# Patient Record
Sex: Male | Born: 1935
Health system: Southern US, Community
[De-identification: ages and names within clinical notes are randomized; demographics above are authoritative.]

## PROBLEM LIST (undated history)

## (undated) DIAGNOSIS — R0602 Shortness of breath: Secondary | ICD-10-CM

## (undated) DIAGNOSIS — I2510 Atherosclerotic heart disease of native coronary artery without angina pectoris: Secondary | ICD-10-CM

## (undated) DIAGNOSIS — I499 Cardiac arrhythmia, unspecified: Secondary | ICD-10-CM

## (undated) DIAGNOSIS — N189 Chronic kidney disease, unspecified: Secondary | ICD-10-CM

## (undated) DIAGNOSIS — K635 Polyp of colon: Secondary | ICD-10-CM

## (undated) DIAGNOSIS — K5790 Diverticulosis of intestine, part unspecified, without perforation or abscess without bleeding: Secondary | ICD-10-CM

## (undated) DIAGNOSIS — I714 Abdominal aortic aneurysm, without rupture, unspecified: Secondary | ICD-10-CM

## (undated) DIAGNOSIS — I739 Peripheral vascular disease, unspecified: Secondary | ICD-10-CM

## (undated) DIAGNOSIS — I509 Heart failure, unspecified: Secondary | ICD-10-CM

## (undated) DIAGNOSIS — Z953 Presence of xenogenic heart valve: Secondary | ICD-10-CM

## (undated) DIAGNOSIS — Z87440 Personal history of urinary (tract) infections: Secondary | ICD-10-CM

## (undated) DIAGNOSIS — E785 Hyperlipidemia, unspecified: Secondary | ICD-10-CM

## (undated) DIAGNOSIS — I219 Acute myocardial infarction, unspecified: Secondary | ICD-10-CM

## (undated) DIAGNOSIS — Z978 Presence of other specified devices: Secondary | ICD-10-CM

## (undated) DIAGNOSIS — K648 Other hemorrhoids: Secondary | ICD-10-CM

## (undated) DIAGNOSIS — K469 Unspecified abdominal hernia without obstruction or gangrene: Secondary | ICD-10-CM

## (undated) DIAGNOSIS — I4891 Unspecified atrial fibrillation: Secondary | ICD-10-CM

## (undated) DIAGNOSIS — K219 Gastro-esophageal reflux disease without esophagitis: Secondary | ICD-10-CM

## (undated) DIAGNOSIS — I10 Essential (primary) hypertension: Secondary | ICD-10-CM

## (undated) DIAGNOSIS — A389 Scarlet fever, uncomplicated: Secondary | ICD-10-CM

## (undated) HISTORY — PX: HERNIA REPAIR: SHX51

## (undated) HISTORY — PX: CARDIAC VALVE SURGERY: SHX40

## (undated) HISTORY — DX: Diverticulosis of intestine, part unspecified, without perforation or abscess without bleeding: K57.90

## (undated) HISTORY — DX: Abdominal aortic aneurysm, without rupture: I71.4

## (undated) HISTORY — PX: CORONARY ARTERY BYPASS GRAFT: SHX141

## (undated) HISTORY — DX: Atherosclerotic heart disease of native coronary artery without angina pectoris: I25.10

## (undated) HISTORY — DX: Abdominal aortic aneurysm, without rupture, unspecified: I71.40

## (undated) HISTORY — PX: APPENDECTOMY: SHX54

## (undated) HISTORY — DX: Other hemorrhoids: K64.8

## (undated) HISTORY — DX: Personal history of urinary (tract) infections: Z87.440

## (undated) HISTORY — DX: Unspecified atrial fibrillation: I48.91

## (undated) HISTORY — DX: Peripheral vascular disease, unspecified: I73.9

## (undated) HISTORY — PX: TONSILLECTOMY: SUR1361

## (undated) HISTORY — PX: EYE SURGERY: SHX253

## (undated) HISTORY — DX: Hyperlipidemia, unspecified: E78.5

## (undated) HISTORY — DX: Gastro-esophageal reflux disease without esophagitis: K21.9

## (undated) HISTORY — DX: Essential (primary) hypertension: I10

## (undated) HISTORY — DX: Polyp of colon: K63.5

## (undated) HISTORY — DX: Heart failure, unspecified: I50.9

## (undated) HISTORY — PX: ANGIOPLASTY: SHX39

---

## 2001-11-29 ENCOUNTER — Ambulatory Visit (HOSPITAL_COMMUNITY): Admission: RE | Admit: 2001-11-29 | Discharge: 2001-11-29 | Payer: Self-pay | Admitting: Cardiology

## 2001-11-29 ENCOUNTER — Encounter: Payer: Self-pay | Admitting: Cardiology

## 2003-01-10 ENCOUNTER — Encounter: Payer: Self-pay | Admitting: *Deleted

## 2003-01-10 ENCOUNTER — Ambulatory Visit (HOSPITAL_COMMUNITY): Admission: RE | Admit: 2003-01-10 | Discharge: 2003-01-10 | Payer: Self-pay | Admitting: *Deleted

## 2004-11-24 ENCOUNTER — Ambulatory Visit (HOSPITAL_COMMUNITY): Admission: RE | Admit: 2004-11-24 | Discharge: 2004-11-24 | Payer: Self-pay | Admitting: Ophthalmology

## 2004-11-24 ENCOUNTER — Encounter: Payer: Self-pay | Admitting: Ophthalmology

## 2005-04-21 ENCOUNTER — Ambulatory Visit: Payer: Self-pay | Admitting: *Deleted

## 2005-08-19 ENCOUNTER — Ambulatory Visit: Payer: Self-pay

## 2006-10-01 ENCOUNTER — Ambulatory Visit: Payer: Self-pay | Admitting: Cardiology

## 2006-10-04 ENCOUNTER — Ambulatory Visit: Payer: Self-pay | Admitting: Cardiology

## 2006-10-05 ENCOUNTER — Inpatient Hospital Stay (HOSPITAL_BASED_OUTPATIENT_CLINIC_OR_DEPARTMENT_OTHER): Admission: RE | Admit: 2006-10-05 | Discharge: 2006-10-05 | Payer: Self-pay | Admitting: Cardiovascular Disease

## 2006-10-05 ENCOUNTER — Ambulatory Visit: Payer: Self-pay | Admitting: Cardiovascular Disease

## 2006-10-11 ENCOUNTER — Ambulatory Visit: Payer: Self-pay

## 2006-10-11 ENCOUNTER — Ambulatory Visit: Payer: Self-pay | Admitting: *Deleted

## 2006-10-11 ENCOUNTER — Encounter: Payer: Self-pay | Admitting: Cardiology

## 2006-10-11 LAB — CONVERTED CEMR LAB
BUN: 13 mg/dL (ref 6–23)
CO2: 28 meq/L (ref 19–32)
Calcium: 9.1 mg/dL (ref 8.4–10.5)
GFR calc non Af Amer: 63 mL/min
Glomerular Filtration Rate, Af Am: 77 mL/min/{1.73_m2}
Potassium: 4 meq/L (ref 3.5–5.1)

## 2006-10-14 ENCOUNTER — Ambulatory Visit: Admission: RE | Admit: 2006-10-14 | Discharge: 2006-10-14 | Payer: Self-pay | Admitting: *Deleted

## 2006-10-18 ENCOUNTER — Ambulatory Visit: Payer: Self-pay | Admitting: *Deleted

## 2006-10-19 ENCOUNTER — Ambulatory Visit: Payer: Self-pay | Admitting: *Deleted

## 2006-10-26 ENCOUNTER — Ambulatory Visit: Payer: Self-pay | Admitting: Internal Medicine

## 2006-10-26 ENCOUNTER — Ambulatory Visit (HOSPITAL_COMMUNITY): Admission: RE | Admit: 2006-10-26 | Discharge: 2006-10-26 | Payer: Self-pay | Admitting: *Deleted

## 2006-11-16 ENCOUNTER — Encounter (INDEPENDENT_AMBULATORY_CARE_PROVIDER_SITE_OTHER): Payer: Self-pay | Admitting: Specialist

## 2006-11-16 ENCOUNTER — Inpatient Hospital Stay (HOSPITAL_COMMUNITY): Admission: RE | Admit: 2006-11-16 | Discharge: 2006-11-22 | Payer: Self-pay | Admitting: Cardiothoracic Surgery

## 2006-11-16 ENCOUNTER — Ambulatory Visit: Payer: Self-pay | Admitting: Cardiology

## 2006-11-24 ENCOUNTER — Ambulatory Visit: Payer: Self-pay | Admitting: *Deleted

## 2006-11-24 ENCOUNTER — Inpatient Hospital Stay (HOSPITAL_COMMUNITY): Admission: AD | Admit: 2006-11-24 | Discharge: 2006-11-26 | Payer: Self-pay | Admitting: *Deleted

## 2006-11-25 ENCOUNTER — Encounter: Payer: Self-pay | Admitting: Cardiology

## 2006-11-29 ENCOUNTER — Ambulatory Visit: Payer: Self-pay | Admitting: Cardiology

## 2006-12-01 ENCOUNTER — Ambulatory Visit: Payer: Self-pay | Admitting: *Deleted

## 2006-12-03 ENCOUNTER — Ambulatory Visit: Payer: Self-pay | Admitting: Cardiovascular Disease

## 2006-12-10 ENCOUNTER — Ambulatory Visit: Payer: Self-pay | Admitting: Cardiology

## 2006-12-15 ENCOUNTER — Ambulatory Visit: Payer: Self-pay | Admitting: *Deleted

## 2006-12-22 ENCOUNTER — Ambulatory Visit: Payer: Self-pay | Admitting: Internal Medicine

## 2007-01-04 ENCOUNTER — Ambulatory Visit: Payer: Self-pay | Admitting: *Deleted

## 2007-01-12 ENCOUNTER — Ambulatory Visit: Payer: Self-pay | Admitting: Cardiovascular Disease

## 2007-02-09 ENCOUNTER — Ambulatory Visit: Payer: Self-pay | Admitting: Cardiology

## 2007-02-10 ENCOUNTER — Ambulatory Visit: Payer: Self-pay | Admitting: *Deleted

## 2007-03-09 ENCOUNTER — Ambulatory Visit: Payer: Self-pay | Admitting: *Deleted

## 2007-03-09 ENCOUNTER — Ambulatory Visit: Payer: Self-pay | Admitting: Cardiology

## 2007-03-09 LAB — CONVERTED CEMR LAB
BUN: 13 mg/dL (ref 6–23)
Calcium: 9.2 mg/dL (ref 8.4–10.5)
Chloride: 103 meq/L (ref 96–112)
GFR calc non Af Amer: 70 mL/min

## 2007-04-01 ENCOUNTER — Ambulatory Visit: Payer: Self-pay | Admitting: Cardiology

## 2007-04-20 ENCOUNTER — Ambulatory Visit: Payer: Self-pay | Admitting: *Deleted

## 2007-05-04 ENCOUNTER — Ambulatory Visit: Payer: Self-pay | Admitting: Cardiology

## 2007-06-01 ENCOUNTER — Ambulatory Visit: Payer: Self-pay | Admitting: Cardiology

## 2007-06-15 ENCOUNTER — Ambulatory Visit: Payer: Self-pay | Admitting: Cardiology

## 2007-06-30 ENCOUNTER — Ambulatory Visit: Payer: Self-pay | Admitting: Cardiology

## 2007-07-06 ENCOUNTER — Ambulatory Visit: Payer: Self-pay | Admitting: Cardiology

## 2007-07-27 ENCOUNTER — Ambulatory Visit: Payer: Self-pay | Admitting: Cardiology

## 2007-08-09 ENCOUNTER — Ambulatory Visit: Payer: Self-pay | Admitting: Cardiology

## 2007-08-24 ENCOUNTER — Ambulatory Visit: Payer: Self-pay | Admitting: Cardiology

## 2007-08-30 ENCOUNTER — Ambulatory Visit: Payer: Self-pay

## 2007-09-14 ENCOUNTER — Ambulatory Visit: Payer: Self-pay | Admitting: Cardiology

## 2007-09-29 ENCOUNTER — Ambulatory Visit: Payer: Self-pay | Admitting: Cardiology

## 2007-10-13 ENCOUNTER — Ambulatory Visit: Payer: Self-pay

## 2007-10-27 ENCOUNTER — Ambulatory Visit: Payer: Self-pay | Admitting: Cardiovascular Disease

## 2007-11-04 ENCOUNTER — Ambulatory Visit: Payer: Self-pay | Admitting: Internal Medicine

## 2007-11-04 LAB — CONVERTED CEMR LAB
Basophils Relative: 0.2 % (ref 0.0–1.0)
Eosinophils Relative: 0.9 % (ref 0.0–5.0)
Hemoglobin: 13.9 g/dL (ref 13.0–17.0)
Lymphocytes Relative: 16.7 % (ref 12.0–46.0)
Monocytes Absolute: 0.5 10*3/uL (ref 0.2–0.7)
Monocytes Relative: 6.8 % (ref 3.0–11.0)
Neutro Abs: 5 10*3/uL (ref 1.4–7.7)
RDW: 13.8 % (ref 11.5–14.6)
WBC: 6.7 10*3/uL (ref 4.5–10.5)

## 2007-11-11 ENCOUNTER — Inpatient Hospital Stay (HOSPITAL_COMMUNITY): Admission: EM | Admit: 2007-11-11 | Discharge: 2007-11-14 | Payer: Self-pay | Admitting: Emergency Medicine

## 2007-11-11 ENCOUNTER — Ambulatory Visit: Payer: Self-pay | Admitting: Cardiovascular Disease

## 2007-11-23 ENCOUNTER — Ambulatory Visit: Payer: Self-pay | Admitting: Cardiology

## 2007-11-29 ENCOUNTER — Encounter: Admission: RE | Admit: 2007-11-29 | Discharge: 2007-11-29 | Payer: Self-pay | Admitting: Neurosurgery

## 2007-12-30 ENCOUNTER — Encounter: Admission: RE | Admit: 2007-12-30 | Discharge: 2007-12-30 | Payer: Self-pay | Admitting: Neurosurgery

## 2008-02-03 ENCOUNTER — Encounter: Admission: RE | Admit: 2008-02-03 | Discharge: 2008-02-03 | Payer: Self-pay | Admitting: Neurosurgery

## 2008-02-21 ENCOUNTER — Ambulatory Visit: Payer: Self-pay | Admitting: Cardiology

## 2008-02-28 ENCOUNTER — Ambulatory Visit: Payer: Self-pay

## 2008-11-01 ENCOUNTER — Ambulatory Visit: Payer: Self-pay

## 2008-11-01 ENCOUNTER — Ambulatory Visit: Payer: Self-pay | Admitting: Cardiology

## 2008-11-20 ENCOUNTER — Ambulatory Visit: Payer: Self-pay

## 2008-11-20 ENCOUNTER — Encounter: Payer: Self-pay | Admitting: Cardiology

## 2009-05-07 ENCOUNTER — Encounter: Payer: Self-pay | Admitting: Cardiology

## 2009-05-08 ENCOUNTER — Encounter: Payer: Self-pay | Admitting: Cardiology

## 2009-05-08 LAB — CONVERTED CEMR LAB
ALT: 13 units/L
AST: 16 units/L
CO2: 26 meq/L
Calcium: 9.1 mg/dL
Chloride: 101 meq/L
Cholesterol: 148 mg/dL
HDL: 46 mg/dL
Potassium: 4 meq/L
Triglyceride fasting, serum: 113 mg/dL

## 2009-06-03 ENCOUNTER — Encounter: Payer: Self-pay | Admitting: Cardiology

## 2009-07-06 DIAGNOSIS — I714 Abdominal aortic aneurysm, without rupture, unspecified: Secondary | ICD-10-CM | POA: Insufficient documentation

## 2009-07-06 DIAGNOSIS — Z8679 Personal history of other diseases of the circulatory system: Secondary | ICD-10-CM | POA: Insufficient documentation

## 2009-07-16 ENCOUNTER — Ambulatory Visit: Payer: Self-pay | Admitting: Cardiology

## 2009-08-06 ENCOUNTER — Encounter: Payer: Self-pay | Admitting: Cardiology

## 2009-10-23 ENCOUNTER — Encounter: Payer: Self-pay | Admitting: Cardiology

## 2009-10-23 ENCOUNTER — Ambulatory Visit (HOSPITAL_COMMUNITY): Admission: RE | Admit: 2009-10-23 | Discharge: 2009-10-23 | Payer: Self-pay | Admitting: Cardiology

## 2009-10-23 ENCOUNTER — Ambulatory Visit: Payer: Self-pay

## 2009-10-23 ENCOUNTER — Ambulatory Visit: Payer: Self-pay | Admitting: Internal Medicine

## 2009-11-15 ENCOUNTER — Encounter (INDEPENDENT_AMBULATORY_CARE_PROVIDER_SITE_OTHER): Payer: Self-pay | Admitting: *Deleted

## 2010-02-11 ENCOUNTER — Encounter: Payer: Self-pay | Admitting: Cardiology

## 2010-06-03 ENCOUNTER — Encounter: Payer: Self-pay | Admitting: Cardiology

## 2010-12-02 ENCOUNTER — Encounter: Payer: Self-pay | Admitting: Cardiology

## 2011-01-01 ENCOUNTER — Telehealth: Payer: Self-pay | Admitting: Cardiology

## 2011-01-06 ENCOUNTER — Ambulatory Visit
Admission: RE | Admit: 2011-01-06 | Discharge: 2011-01-06 | Payer: Self-pay | Source: Home / Self Care | Attending: Cardiology | Admitting: Cardiology

## 2011-01-06 ENCOUNTER — Encounter: Payer: Self-pay | Admitting: Cardiology

## 2011-01-13 NOTE — Letter (Signed)
Summary: [facility] Note  [facility] Note   Imported By: Roderic Ovens 02/28/2010 15:01:09  _____________________________________________________________________  External Attachment:    Type:   Image     Comment:   External Document

## 2011-01-13 NOTE — Letter (Signed)
Summary: [facility] Progress Note   [facility] Progress Note   Imported By: Roderic Ovens 06/30/2010 14:25:28  _____________________________________________________________________  External Attachment:    Type:   Image     Comment:   External Document

## 2011-01-15 ENCOUNTER — Encounter (INDEPENDENT_AMBULATORY_CARE_PROVIDER_SITE_OTHER): Payer: Medicare Other

## 2011-01-15 ENCOUNTER — Encounter: Payer: Self-pay | Admitting: Cardiology

## 2011-01-15 DIAGNOSIS — I714 Abdominal aortic aneurysm, without rupture: Secondary | ICD-10-CM

## 2011-01-15 DIAGNOSIS — I6529 Occlusion and stenosis of unspecified carotid artery: Secondary | ICD-10-CM

## 2011-01-15 NOTE — Progress Notes (Addendum)
Summary: [facility]  Medications Added LOVAZA 1 GM CAPS (OMEGA-3-ACID ETHYL ESTERS) 2caps qam..1 cap at bedtime NIASPAN 500 MG CR-TABS (NIACIN (ANTIHYPERLIPIDEMIC)) Take 1 tablet by mouth once a day      Allergies Added: NKDA  Visit Type:  W1X Primary Provider:  Casimiro Needle Altheimer  CC:  twinges LUQ.. no other complaints today.  History of Present Illness: Nathaniel [name] returns today for followup of his complex cardiac and vascular history.  He's been doing remarkably well playing golf and dating an old girlfriend. He occasionally twinge of discomfort that lasts seconds along the left sternal border. He thinks this is related to landing on his left shoulder when he reads in bed. He has had no angina.  Denies symptoms of TIAs or mini strokes. He has had no palpitations presyncope or syncope. He's had no lower extremity edema.  I reviewed his records from Dr.Altheimers office. His blood sugar has recent been less well controlled with hemoglobin A1c of 7.3%. He's had difficulty tolerating some of the anti-hyperglycemics.  His lipids have been good with a good HDL.  His last abdominal and carotid ultrasound were in November of 2010. Last echocardiogram was at that time as well and was stable.  Current Medications (verified): 1)  Allopurinol 300 Mg Tabs (Allopurinol) .Marland Kitchen.. 1 Tab Once Daily 2)  Lipitor 20 Mg Tabs (Atorvastatin Calcium) .Marland Kitchen.. 1 Tab Once Daily 3)  Lanoxin 0.25 Mg Tabs (Digoxin) .Marland Kitchen.. 1 Tab Once Daily 4)  Icaps  Caps (Multiple Vitamins-Minerals) .Marland Kitchen.. 1 Tab Once Daily 5)  Altace 5 Mg Caps (Ramipril) .Marland Kitchen.. 1 Cap Once Daily 6)  Aspirin 81 Mg Tbec (Aspirin) .... Take One Tablet By Mouth Daily 7)  Doxazosin Mesylate 2 Mg Tabs (Doxazosin Mesylate) .... 2 Tabs Qam..1 Tab At Bedtime 8)  Actos 30 Mg Tabs (Pioglitazone Hcl) .Marland Kitchen.. 1 Tab Once Daily 9)  Lovaza 1 Gm Caps (Omega-3-Acid Ethyl Esters) .... 2caps Qam..1 Cap At Bedtime 10)  Claritin Reditabs 10 Mg Tbdp (Loratadine) .... As  Needed 11)  Niaspan 500 Mg Cr-Tabs (Niacin (Antihyperlipidemic)) .... Take 1 Tablet By Mouth Once A Day  Allergies (verified): No Known Drug Allergies  Past History:  Past Medical History: Last updated: Jul 12, 2009 CAD, ARTERY BYPASS GRAFT/1988.Marland KitchenREDO 11/2006 (ICD-414.04) ATRIAL FIBRILLATION/POSTOPERATIVE (ICD-427.31) AORTIC STENOSIS, SEVERE/HX OF (ICD-424.1) ABDOMINAL AORTIC ANEURYSM (ICD-441.4) CEREBROVASCULAR DISEASE, HX OF (ICD-V12.50)    Past Surgical History: Last updated: 07-12-2009 CABG 1988..redo 11/2006 Posterior vitrectomy with membrane peel..left eye... SURGEON:  Alford Highland. Rankin, M.D...11/24/2004  Family History: Last updated: 07/12/09 Father died of aortic aneurysm at age 33. Mother died of angina and congestive heart failure at age 67. He has no brothers.  One sister status post coronary angioplasty. He has 2 children, both of whom are adopted.   Social History: Last updated: July 12, 2009 Widowed 1986 He has been helping raise 3 granddaughters since 1994 with a long history of multiple situational family stresses.  He is self-employed as an Engineer, civil (consulting).  He quit smoking in 1992.  He drinks 2 glasses of wine each evening.       Review of Systems       negative other than history of present illness  Vital Signs:  Patient profile:   77 year old male Height:      71.5 inches Weight:      197.75 pounds BMI:     27.29 Pulse rate:   64 / minute Pulse rhythm:   irregular BP sitting:   106 / 62  (left arm) Cuff size:  large  Vitals Entered By: Danielle Rankin, CMA (January 06, 2011 3:57 PM)  Physical Exam  General:  Well developed, well nourished, in no acute distress. Head:  normocephalic and atraumatic Eyes:  PERRLA/EOM intact; conjunctiva and lids normal. Neck:  Neck supple, no JVD. No masses, thyromegaly or abnormal cervical nodes. Chest Dimitri Dsouza:  no tenderness and no instability Lungs:  Clear bilaterally to auscultation and percussion. Heart:  PMI  nondisplaced regular rate and rhythm normal S1-S2, no aortic insufficiency soft systolic murmur bilateral carotid bruits Abdomen:  nontender good bowel sounds no bruit Msk:  Back normal, normal gait. Muscle strength and tone normal. Pulses:  pulses normal in all 4 extremities Extremities:  No clubbing or cyanosis. Neurologic:  Alert and oriented x 3. Skin:  Intact without lesions or rashes. Psych:  Normal affect.   Impression & Recommendations:  Problem # 1:  CAD, ARTERY BYPASS GRAFT/1988.Marland KitchenREDO 11/2006 (ICD-414.04) Assessment Unchanged  His updated medication list for this problem includes:    Altace 5 Mg Caps (Ramipril) .Marland Kitchen... 1 cap once daily    Aspirin 81 Mg Tbec (Aspirin) .Marland Kitchen... Take one tablet by mouth daily  Orders: Carotid Duplex (Carotid Duplex) EKG w/ Interpretation (93000)  Problem # 2:  ATRIAL FIBRILLATION/POSTOPERATIVE (ICD-427.31) Assessment: Improved  His updated medication list for this problem includes:    Lanoxin 0.25 Mg Tabs (Digoxin) .Marland Kitchen... 1 tab once daily    Aspirin 81 Mg Tbec (Aspirin) .Marland Kitchen... Take one tablet by mouth daily  Orders: Carotid Duplex (Carotid Duplex) EKG w/ Interpretation (93000)  Problem # 3:  AORTIC STENOSIS, SEVERE/HX OF (ICD-424.1) Assessment: Improved status post aortic valve replacement His updated medication list for this problem includes:    Lanoxin 0.25 Mg Tabs (Digoxin) .Marland Kitchen... 1 tab once daily    Altace 5 Mg Caps (Ramipril) .Marland Kitchen... 1 cap once daily  Problem # 4:  ABDOMINAL AORTIC ANEURYSM (ICD-441.4) needs repeat ultrasound  Problem # 5:  CEREBROVASCULAR DISEASE, HX OF (ICD-V12.50) needs repeat Dopplers Orders: Carotid Duplex (Carotid Duplex)  Other Orders: Abdominal Aorta Duplex (Abd Aorta Duplex)  Patient Instructions: 1)  Your physician recommends that you schedule a follow-up appointment in:  2)  Your physician has requested that you have a carotid duplex. This test is an ultrasound of the carotid arteries in your neck.  It looks at blood flow through these arteries that supply the brain with blood. Allow one hour for this exam. There are no restrictions or special instructions. 3)  Your physician recommends that you continue on your current medications as directed. Please refer to the Current Medication list given to you today. 4)  Your physician has requested that you have an abdominal aorta duplex. During this test, an ultrasound is used to evaluate the aorta. Allow 30 minutes for this exam. Do not eat after midnight the day before and avoid carbonated beverages. There are no restrictions or special instructions.

## 2011-01-15 NOTE — Telephone Encounter (Addendum)
   Phone Note Outgoing Call   Call placed by: Lisabeth Devoid RN,  January 01, 2011 2:49 PM Call placed to: Patient Summary of Call: appt scheduled  Follow-up for Phone Call        Pt aware of appt to follow-up with Dr. Daleen Squibb on 01/06/11 at 3:45 per Dr. Leslie Dales. Mylo Red RN Follow-up by: Lisabeth Devoid RN,  January 01, 2011 2:50 PM

## 2011-01-21 NOTE — Progress Notes (Addendum)
Summary: f1y/dfg    Allergies: No Known Drug Allergies

## 2011-01-28 ENCOUNTER — Telehealth: Payer: Self-pay | Admitting: Cardiology

## 2011-04-02 ENCOUNTER — Encounter: Payer: Self-pay | Admitting: Internal Medicine

## 2011-04-02 ENCOUNTER — Ambulatory Visit (INDEPENDENT_AMBULATORY_CARE_PROVIDER_SITE_OTHER): Payer: Medicare Other | Admitting: Internal Medicine

## 2011-04-02 ENCOUNTER — Telehealth: Payer: Self-pay | Admitting: Internal Medicine

## 2011-04-02 DIAGNOSIS — Z1211 Encounter for screening for malignant neoplasm of colon: Secondary | ICD-10-CM

## 2011-04-02 DIAGNOSIS — K625 Hemorrhage of anus and rectum: Secondary | ICD-10-CM

## 2011-04-02 DIAGNOSIS — E119 Type 2 diabetes mellitus without complications: Secondary | ICD-10-CM

## 2011-04-02 MED ORDER — PEG-KCL-NACL-NASULF-NA ASC-C 100 G PO SOLR: 1.0000 | Freq: Once | ORAL | Status: DC

## 2011-04-02 MED ORDER — PEG-KCL-NACL-NASULF-NA ASC-C 100 G PO SOLR: 1.0000 | Freq: Once | ORAL | Status: AC

## 2011-04-02 NOTE — Patient Instructions (Signed)
Colonoscopy [facility] 4th floor 04/07/11 2:30 pm arrive at 1:30  Colonoscopy brochure given for you to read Hold Actos day of procedure.

## 2011-04-02 NOTE — Telephone Encounter (Signed)
Pt was not seen by Dr. Jarold Motto yesterday. Pt had spoken with Dr. Marina Goodell and he agreed to work the pt in for today.

## 2011-04-02 NOTE — Telephone Encounter (Signed)
Pt set up to see Dr. Marina Goodell today at 2pm. Pt aware of appt date and time.

## 2011-04-02 NOTE — Progress Notes (Signed)
HISTORY OF PRESENT ILLNESS:  Nathaniel [name]. is a 76 y.o. male with multiple significant medical problems, though stable, as listed below. He presents today with concerns of altered bowel habits, fecal soilage, and rectal bleeding. His only GI history is a flexible sigmoidoscopy many years ago. Patient tells me that he has had a several month history of problems with increased frequency of bowel movements. Associated with this has been some rectal burning, fecal soilage, and bleeding. He altered his diet to eliminate chocolate and champagne which resulted in improved bowel movements. However, other symptoms persisted. He denies any change in medications. He is on medication for diabetes, though no longer on Coumadin.  REVIEW OF SYSTEMS:  All non-GI ROS negative except for sinus allergy trouble, back pain, shortness of breath, excessive urination.  Past Medical History  Diagnosis Date  . CAD (coronary artery disease)   . Atrial fibrillation   . Diabetes mellitus   . Hyperlipemia   . Hx: UTI (urinary tract infection)   . Gout   . Hypertension     Past Surgical History  Procedure Date  . Appendectomy   . Cardiac valve surgery   . Angioplasty     Unsure if he had stents put in   . Coronary artery bypass graft   . Hernia repair     Social History Nathaniel [name].  reports that he has quit smoking. His smoking use included Cigarettes. He quit after 20 years of use. He has never used smokeless tobacco. He reports that he drinks alcohol. He reports that he does not use illicit drugs.  family history includes Heart disease in his father.  There is no history of Colon cancer.  No Known Allergies     PHYSICAL EXAMINATION: Vital signs: BP 136/68  Pulse 80  Ht 5\' 11"  (1.803 m)  Wt 200 lb (90.719 kg)  BMI 27.89 kg/m2  Constitutional: generally well-appearing, no acute distress Psychiatric: alert and oriented x3, cooperative Eyes: extraocular movements intact, anicteric, conjunctiva  pink Mouth: oral pharynx moist, no lesions Neck: supple no lymphadenopathy Cardiovascular: heart regular rate and rhythm, 3/6 systolic murmur with prominent heart sounds. Midline incision well-healed Lungs: clear to auscultation bilaterally Abdomen: soft, nontender, nondistended, no obvious ascites, no peritoneal signs, normal bowel sounds, no organomegaly. Prior surgical incision well-healed Rectal: Internal hemorrhoids and small posterior fissure. Normal sphincter down. Normal perianal sensation. No internal mass or tenderness. Hemoccult negative stool Extremities: no lower extremity edema bilaterally Skin: no lesions on visible extremities Neuro: No focal deficits. No asterixis. Normal reflexes  ASSESSMENT:  #1. Minor intermittent rectal bleeding likely due to benign anorectal pathology #2. Colon cancer screening. Appropriate candidate without contraindication #3. Fecal soilage with burning. No obvious impairment in tone. #4. Multiple medical problems. Stable. #5. Diabetes mellitus.  PLAN:  #1. Increased daily fiber supplementation #2. Schedule colonoscopy. Patient is higher than baseline risk due to his comorbidities.The nature of the procedure, as well as the risks, benefits, and alternatives were carefully and thoroughly reviewed with the patient. Ample time for discussion and questions allowed. The patient understood, was satisfied, and agreed to proceed. Movi prep prescribed. Hold diabetic medications in morning of the procedure in order to avoid wanted hypoglycemia. Measured his blood sugar immediately before and after his procedure #3. Local anal measures/therapies to be determined post colonoscopy

## 2011-04-06 ENCOUNTER — Encounter: Payer: Self-pay | Admitting: Internal Medicine

## 2011-04-07 ENCOUNTER — Ambulatory Visit (AMBULATORY_SURGERY_CENTER): Payer: Medicare Other | Admitting: Internal Medicine

## 2011-04-07 ENCOUNTER — Encounter: Payer: Self-pay | Admitting: Internal Medicine

## 2011-04-07 VITALS — BP 158/82 | HR 63 | Temp 98.2°F | Resp 12 | Ht 71.0 in | Wt 190.0 lb

## 2011-04-07 DIAGNOSIS — D126 Benign neoplasm of colon, unspecified: Secondary | ICD-10-CM

## 2011-04-07 DIAGNOSIS — K625 Hemorrhage of anus and rectum: Secondary | ICD-10-CM

## 2011-04-07 DIAGNOSIS — K5730 Diverticulosis of large intestine without perforation or abscess without bleeding: Secondary | ICD-10-CM

## 2011-04-07 DIAGNOSIS — Z1211 Encounter for screening for malignant neoplasm of colon: Secondary | ICD-10-CM

## 2011-04-07 DIAGNOSIS — R197 Diarrhea, unspecified: Secondary | ICD-10-CM

## 2011-04-07 DIAGNOSIS — D129 Benign neoplasm of anus and anal canal: Secondary | ICD-10-CM

## 2011-04-07 LAB — GLUCOSE, CAPILLARY
Glucose-Capillary: 121 mg/dL — ABNORMAL HIGH (ref 70–99)
Glucose-Capillary: 99 mg/dL (ref 70–99)

## 2011-04-07 MED ORDER — SODIUM CHLORIDE 0.9 % IV SOLN: 500.0000 mL | INTRAVENOUS | Status: DC

## 2011-04-07 NOTE — Patient Instructions (Signed)
Please review all of the handouts given to you by your recovery room nurse.  Continue to increase your fiber intake and use your annusol suppositories as needed before bedtime.  You will need another colonoscopy in 3 years.  You may resume your routine medicines.  If you have any questions or concerns, please call us at [phone].  Thank-you.

## 2011-04-08 ENCOUNTER — Telehealth: Payer: Self-pay

## 2011-04-08 NOTE — Telephone Encounter (Signed)

## 2011-04-27 ENCOUNTER — Other Ambulatory Visit: Payer: Self-pay | Admitting: Cardiology

## 2011-04-28 NOTE — Assessment & Plan Note (Signed)
[facility]                            CARDIOLOGY OFFICE NOTE   ASAR, EVILSIZER                         MRN:          [id number]  DATE:04/20/2007                            DOB:          [birth year]    [name] is a very pleasant 76 year old white male, nearly 20  years post op CABG by Dr. Particia Lather, and 5 months post aortic valve  replacement with a 21-mm pericardial tissue valve by Dr. Tyrone Sage.  At  the same time, he had a saphenous vein graft to the posterior branch of  the right coronary artery.  The patient was seen by Dr. Daleen Squibb on April 18  because of lightheaded feeling and dizziness.  He had some orthostatic  hypotension.   ADDITIONAL DIAGNOSES:  1. Diabetes.  2. Abdominal aortic aneurysm that is stable but will need to be      followed up with an abdominal ultrasound in 6 months.  3. Post op atrial fib without recurrence.  4. Gout.  5. Chronic back pain.   The patient states that he is feeling much better, playing golf, though  he does fatigue rather easily.   MEDICATIONS:  1. Actos 30 daily.  2. Zetia 10.  3. Allopurinol 300.  4. Lipitor 20.  5. Zyrtec.  6. Altace 2.5 b.i.d.  7. Lasix 40.  8. Recently started on Lovasa for triglycerides.   Blood pressure 119/63, pulse 80, normal sinus rhythm.  GENERAL APPEARANCE:  Normal.  JVP is not elevated.  Carotid pulses are palpable and equal without  bruits.  Lungs are clear.  CARDIAC EXAM:  Reveals 1-2/6 short systolic ejection murmur in the  aortic area and the left sternal border.  No diastolic murmur.  ABDOMINAL EXAM:  Normal.  Liver, kidneys and spleen not palpable.  EXTREMITIES:  No edema.   DIAGNOSES:  As noted above.  Patient much improved.   I suggested the patient increase his exercise program somewhat.  This is  complicated by the fact that his 64 year old granddaughter has come to  live with him, having just completed rehab program.   I have suggested  followup BMP and he will see Dr. Daleen Squibb in 2 months for  followup.     Cecil Cranker, MD, [facility]  Electronically Signed    EJL/MedQ  DD: 04/20/2007  DT: 04/20/2007  Job #: [id number]   cc:   [name], M.D.

## 2011-04-28 NOTE — Assessment & Plan Note (Signed)
Nathaniel [name]                            CARDIOLOGY OFFICE NOTE   Nathaniel [name], Nathaniel [name]                         MRN:          [id number]  DATE:07/06/2007                            DOB:          September 19, 1932    Nathaniel [name] comes in today for followup.  Nathaniel [name] is a former patient of Dr.  Glennon Hamilton, who actually saw him as an add-on for orthostatic  hypotension April 01, 2007.   Since that time Nathaniel [name] switched from Cardura to Flomax and his symptoms have  improved.   Nathaniel [name] saw Dr. Glennon Hamilton on Apr 20, 2007.   Over the last few days Nathaniel [name] has been having a lot of fatigue, dizziness,  and weakness in general.  Nathaniel [name] denies any chest pain, Nathaniel [name] gets occasionally  a little short of breath after Nathaniel [name] stands up.  The shortness of breath is  actually relieved by having a bowel movement.   His problem list is remarkable in that Nathaniel [name] has had coronary bypass  surgery some 20 years ago.  About seven months ago Nathaniel [name] had aortic valve  replacement with a 21-mm pericardial tissue valve by Dr. Sheliah Plane  and a redo vein graft to the posterior branch of the right coronary  artery.   Nathaniel [name] has diabetes, has abdominal aortic aneurysm that needs follow up in  November 2008, per Dr. Corinda Gubler, postoperative atrial fibrillation  without recurrence, gout, chronic back pain.   Nathaniel [name] is under a lot of stress with a granddaughter who has moved back in  with him who is going through a drug rehab program.  Nathaniel [name] has essentially  raised three of his granddaughters for his mentally ill daughter.  This  is a long story with a lot of hardship and hard work.   CURRENT MEDS:  1. Aspirin 81 mg a day.  2. Flomax 0.4 daily.  3. Lasix 40 mg daily.  4. Altace 2.5 b.i.d.  5. Allopurinol 300 mg a day.  6. Zetia 10 mg a day.  7. Actos 30 mg a day.  8. Lipitor 20 mg a day.  9. Coumadin as directed.  10.Lovaza q.i.d.  11.Lanoxin 0.25 mg a day.  12.I-caps.   PHYSICAL EXAM:  Today, blood pressure 144/85 lying down,  pulse 67.  When  Nathaniel [name] stood up, it dropped to 120/74; and Nathaniel [name] had some visual flutters,  which Nathaniel [name] describes as poor reception on the television, heart rate was  74.  HEENT:  Nathaniel [name] has a ruddy complexion.  PERLA.  Extraocular movements  intact.  Sclerae clear.  Facial symmetry is normal.  Dentition  satisfactory.  Carotid upstrokes were equal bilaterally without bruits.  No JVD.  Thyroid is not enlarged.  Trachea is midline.  LUNGS: clear.  HEART:  Regular rate and rhythm.  Nathaniel [name] has a physiologically split S2.  ABDOMINAL EXAM:  Soft, good bowel sounds, no midline bruit no  hepatomegaly.  EXTREMITIES:  Revealed no cyanosis or clubbing or edema.  Pulses are  present.  NEUROLOGICAL:  Intact.  Electrocardiogram shows sinus rhythm with ST segment depression  inferolaterally.  There  has been no change since before.   ASSESSMENT AND PLAN:  I think that Nathaniel [name] is working way too hard (up  to 12-16 hours a day), under a lot of stress with social issues with his  granddaughter and long hours with her, going to class etcetera, and is  also dehydrated.  Nathaniel [name] still is still orthostatic, which is due obviously  or accentuated by the Lasix, Flomax, and Altace.   Instead of changing his medications, which are well worked out, I have  asked him to stay well-hydrated, and try his best to cut back on his  hours.  Nathaniel [name] will let me know if Nathaniel [name] is getting any worse or is not getting  better.  Otherwise I will see him back in November.  At that time Nathaniel [name]  will need an abdominal ultrasound.     Thomas C. Daleen Squibb, MD, [facility]  Electronically Signed    TCW/MedQ  DD: 07/06/2007  DT: 07/07/2007  Job #: [id number]   cc:   [name], M.D.

## 2011-04-28 NOTE — Consult Note (Signed)
NAMEIBRAHIMA, [name]                  ACCOUNT NO.:  [id number]   MEDICAL RECORD NO.:  [id number]          PATIENT TYPE:  INP   LOCATION:  3018                         FACILITY:  [facility]   PHYSICIAN:  Noralyn Pick. Eden Emms, MD, FACCDATE OF BIRTH:  1932-06-07   DATE OF CONSULTATION:  DATE OF DISCHARGE:                                 CONSULTATION   Nathaniel [name] is an unfortunate 77 year old patient admitted to the hospital  with bilateral subdural hematomas.   The patient has been on Coumadin since I believe December 2007.  He had  redo CABG with a tissue AVR and had postop A Fib.  He has been followed  in our Coumadin clinic closely.  The patient has been having an  increased headache over the last 2 to 3 weeks.   There is a question of sinusitis and some dizziness, and the patient was  treated for sinusitis with erythromycin.   I do not know that he is on any other antibiotics that could have  increased his INR.   The patient was subsequently admitted to the hospital yesterday and  noted to have fairly large bilateral subdural hematomas that were called  acute on chronic.  He was given 2 units of fresh frozen plasma.  His INR  at the time of admission was 3.2 and is currently 1.3.   The patient has not had any seizures.  His headache is improving.   In talking to the patient, he has not had a recurrence of his A Fib  since his repeat heart surgery in 2007.  He has a tissue valve.  Given  the fact that he has had a fairly life-threatening subdural hematoma I  would take the patient off Coumadin indefinitely.  At some point in the  future he should be back on a baby aspirin given his coronary artery  disease.   In talking to the patient, he is currently not having chest pain.  There  has been no PND or orthopnea.  He used to see Nathaniel [name] and now  follows in the clinic with Nathaniel [name].   As indicated, his Coumadin had been followed closely in our clinic every  2 to 4 weeks.  He did mention  that a few months ago he may have hit his  head on the car briefly.   REVIEW OF SYSTEMS:  Otherwise negative.   PAST MEDICAL HISTORY:  Remarkable for diabetes type 2 diagnosed in 2000,  hyperlipidemia,  hypertension, atherosclerotic heart disease with bypass  graft in 1988, second surgery in December 2007, at which time he had  tissue aortic valve replacement that was also done.  He had periop A  Fib, which was why he was on Coumadin.   The patient also has a history of carotid artery disease.  I do not have  accurate duplex examinations in front of me, but he sees Nathaniel [name] for  this.  This would be an indication for antiplatelet therapy, not  necessarily anticoagulation.   History of gout, history of allergic rhinitis, history of prostatism.  He also has  situational stress.   ALLERGIES:  HE DENIES ANY ALLERGIES BUT IS SENSITIVE TO TOPROL, CAUSING  FATIGUE, AND NORVASC MAY HAVE CAUSED A HEADACHE.   MEDICATIONS ON ADMISSION:  1. Actos 30 a day.  2. Lipitor 20 a day.  3. Zetia 10 a day.  4. Lovaza 4 a day.  5. Altace 2.5 mg b.i.d.  6. Coumadin as directed by clinic.  7. Lasix 40 a day.  8. Lanoxin 0.25 a day.  9. Ecotrin baby aspirin 81 mg a day.  10.Allopurinol.  11.Colchicine p.r.n.  12.Zyrtec 10 a day.  13.Clarinex p.r.n.  14.Flomax 0.4 daily.   FAMILY HISTORY:  Father died at the age of 18 of an aortic aneurysm.  Mother died of angina and congestive heart failure at age 59.  The  patient is widowed since 73.  He is active, however, raising his  granddaughters.  He works in the financial area and has some history of  anxiety.  He quit smoking in 1992 and drinks 2 glasses of wine a day.   REVIEW OF SYSTEMS:  Otherwise negative.   PHYSICAL EXAMINATION:  GENERAL:  His exam is remarkable for an elderly  white male in no distress.  VITAL SIGNS:  Stable.  Temperature is 98.3, pulse 65 and regular,  respiratory rate 20, blood pressure 134/80, and 98% sats on room air.   HEENT:  Unremarkable.  NECK:  He has a faint right carotid bruit.  There is no lymphadenopathy,  thyromegaly, JVP elevation.  LUNGS:  Good diaphragmatic motion. No wheezing.  He is status post  sternotomy.  HEART:  He has a systolic ejection murmur through his tissue valve.  There is no AI.  PMI is normal.  ABDOMEN:  Benign.  Bowel sounds positive.  There is no  hepatosplenomegaly or gastroesophageal reflux .  No tenderness.  No AAA.  EXTREMITIES:  Distal pulses are intact.  No edema.  NEURO:  Nonfocal.  He does have a headache.  There is no muscle  weakness.  SKIN:  Warm and dry.   EKG shows sinus rhythm with LVH and DIG effect.   Lab work is remarkable for an INR that is now 1.3.  DIG level 1.3.  Hematocrit 41, platelets 250, K 4, creatinine 1.0.  Chest x-ray showed  no active disease, status post sternotomy and AVR.  CT scan was as  described with fairly large bilateral acute-on-chronic subdural  hematomas.   IMPRESSION:  1. Subdural hematoma in the setting of Coumadin.  His INR was not that      high.  There may have been subacute trauma weeks ago.  Coumadin      reversed, antiplatelet and antithrombin agents stopped.  Follow up      neurology.  2. History of perioperative atrial fibrillation.  No recurrence.      Currently no indication for Coumadin and it will be stopped      indefinitely.  Continue digoxin.  3. History of coronary disease with 2 previous bypasses.  No angina.      Continue beta blocker.  Resume baby aspirin when okay with      neurology, likely would be in 6 to 8 weeks.  4. Tissue aortic valve, sounds normal by exam.  It may be worthwhile      to check an echo while he is here in the hospital.  Since he has a      tissue valve, there is no indication for anticoagulation for the  valve alone.  He is on anti-cholesterol medicine, which may slow      progression of tissue valve failure.  5. Question history of carotid stenoses, followed by Nathaniel [name].   It      may be worthwhile to do in-house carotid duplex exam given the      issue regarding his antiplatelets and antithrombins.   We would be happy to follow the patient along; however, currently there  is no indication for Coumadin in the setting of a life-threatening  subdural hematoma.      Noralyn Pick. Eden Emms, MD, [facility]  Electronically Signed     PCN/MEDQ  D:  11/12/2007  T:  11/12/2007  Job:  [id number]

## 2011-04-28 NOTE — Assessment & Plan Note (Signed)
[facility]                            CARDIOLOGY OFFICE NOTE   CRESTON, KLAS                         MRN:          [id number]  DATE:11/04/2007                            DOB:          08-10-1932    PATIENT IDENTIFICATION:  Nathaniel [name] is a gentleman previously followed by  Nathaniel [name].  He was seen by Nathaniel [name] since Nathaniel [name].  He comes  in today as a walk-in.   He as last seen in clinic in July 2008.   The patient was seen yesterday  by Nathaniel [name].  He was diagnosed with  a sinus infection and placed on antibiotics (shot given and then p.o.  erythromycin).  He woke up at 2:00 a.m. this morning, ate a little  something, later this morning felt nauseated and dizzy.  Note, he had  taken his erythromycin last night on an empty stomach.  He became  anxious, presented here for further evaluation.   Denies chest pressure.  Nausea may have improved some.  Note, he has not  been feeling well for about 10 days, tired, lethargic, congested.  Prior  to this, he had been doing okay.   CURRENT MEDICATIONS:  1. Aspirin 81.  2. Coumadin.  3. Lasix 40 mg.  4. Altace 2.5 mg b.i.d.  5. Allopurinol 300.  6. Zetia 10.  7. Actos 30.  8. Lanoxin 0.25.  9. Lovaza two b.i.d.  10.Erythromycin yesterday.  11.Lipitor 20.   PHYSICAL EXAMINATION:  GENERAL:  The patient is currently in no  distress.  VITAL SIGNS:  Blood pressure laying 149/79, pulse 54, slightly dizzy;  sitting at 152/77, pulse 56; standing 169/73, pulse 55; 2 minutes  159/82, pulse 57; 5 minutes pulse of 59, blood pressure 177/91.  The  patient just took his medications.  LUNGS:  Clear.  NECK:  JVP is normal.  CARDIAC:  Regular rate and rhythm, S1, S2.  No S3.  ABDOMEN:  Benign.  EXTREMITIES:  No edema.   IMPRESSION:  Dizziness.  He is not orthostatic.  I think with the sinus  infection and the erythromycin, he can have explanation for his  symptoms.  His blood pressure is actually  a little up which may also add  to things.   For now, I would follow.  No EKG was done.  His LVH with repolarization  abnormality which is unchanged.   I would continue on the same regimen for now, again.  Will need to  follow up his blood pressure.  If he cannot tolerate the erythromycin,  he needs to talk to Dr. Jethro Bolus about trying another agent.  I  encouraged him to use the erythromycin with food.  I will check on when  he is due to follow up with Nathaniel [name] in clinic for continued monitoring  of his blood pressure.     Pricilla Riffle, MD, [facility]  Electronically Signed    PVR/MedQ  DD: 11/04/2007  DT: 11/05/2007  Job #: [id number]

## 2011-04-28 NOTE — Assessment & Plan Note (Signed)
[facility]                            CARDIOLOGY OFFICE NOTE   CHELSEA, NUSZ                         MRN:          [id number]  DATE:11/23/2007                            DOB:          06-Feb-1932    ADDENDUM   I asked him to address specifically with Dr. Phoebe Perch when he can go back  on at least 81 mg of aspirin.     Thomas C. Daleen Squibb, MD, [facility]     TCW/MedQ  DD: 11/23/2007  DT: 11/24/2007  Job #: [id number]

## 2011-04-28 NOTE — Assessment & Plan Note (Signed)
[facility]                            CARDIOLOGY OFFICE NOTE   TREVIOUS, RAMPEY                         MRN:          [id number]  DATE:02/21/2008                            DOB:          1932-03-15    [name] comes in today for close follow-up.   He is now several months out from treatment of bilateral chronic  subdural hematomas.  Please see my note on November 23, 2007.   His problem list is extensive and has been reviewed in the chart.  1. Small abdominal aortic aneurysm/asymptomatic.  2. Coronary artery disease.  He is status post coronary artery bypass      grafting in 1988, redo graft a one-vessel vein graft to posterior      descending in December 2007 with aortic valve replacement.  3. History of severe stenosis.  4. Postoperative atrial fibrillation.  5. Anticoagulation with Coumadin until he had these subdural bleeds.      This was from [place]  He      has now been started back on aspirin 81 mg a day by Dr. Phoebe Perch of      Neurosurgery.  6. History of cerebrovascular disease.  He is due carotid Dopplers.      He is having no symptoms of TIAs or stroke.   He looks good.  He has been pretty much inside most of the winter not  playing golf.  He has gained 5 pounds.  His blood sugar has been up and  down.  This followed by Dr. Leslie Dales.   His biggest complaint is just fatigue and not feeling well.  I think a  lot of this is situational or conditional.   Current meds are Lasix 40 mg a day, allopurinol 300 mg a day, Zetia 10  mg a day, Actos 30 mg a day, Lipitor 20 mg a day, Lanoxin unknown dose,  enteric-coated aspirin 81 mg a day, Altace 5 mg daily.   PHYSICAL EXAMINATION:  His blood pressure is 123/80, his pulse is 57 and  regular.  His weight is 190.  HEENT:  Normocephalic, atraumatic.  PERRL.  Extraocular movements  intact.  Sclerae are clear.  Facial symmetry is normal.  He wears  glasses.  NECK is remarkable for bilateral bruits.  Thyroid is not enlarged.  Trachea is midline.  No JVD.  LUNGS:  Clear.  HEART:  Reveals a normal S1-S2 without gallop  ABDOMEN:  Soft, good bowel sounds.  No midline bruit.  EXTREMITIES:  No cyanosis, clubbing or significant edema.  Pulses are  intact.   [name] is complaining mostly of fatigue and just not feeling well.  I  think a lot of this is just from the winter, being in, not playing golf  and just sort  of situational.  I have encouraged to get out now that we  have daylight savings time, play some golf and socialize a bit.   We will continue his current meds.  I will obtain carotid Dopplers.  We  will  call him with the results.  I will plan on seeing him back again  this fall at which time he will need abdominal ultrasound to screen his  small abdominal aortic aneurysm and also a stress test.     Maisie Fus C. Daleen Squibb, MD, [facility]  Electronically Signed    TCW/MedQ  DD: 02/21/2008  DT: 02/22/2008  Job #: [id number]   cc:   [name], M.D.

## 2011-04-28 NOTE — Discharge Summary (Signed)
[name], Nathaniel [name]                  ACCOUNT NO.:  [id number]   MEDICAL RECORD NO.:  [id number]          PATIENT TYPE:  INP   LOCATION:  3018                         FACILITY:  [facility]   PHYSICIAN:  [name], M.D.DATE OF BIRTH:  08-Aug-1932   DATE OF ADMISSION:  11/11/2007  DATE OF DISCHARGE:                               DISCHARGE SUMMARY   REASON FOR ADMISSION:  Bilateral subdural hematomas with severe  headache.   HISTORY:  This is a 77 year old man who has been on Coumadin since an  episode of postoperative atrial fibrillation (in the context of volume  overload) in December 2007 after undergoing aortic valve replacement and  redo coronary artery bypass graft surgery.  His INR has been followed  every 2-4 weeks by the [facility] with the last INR, by his  history, done about 10 days prior to admission.  He had not had any  prior bleeding complications.   He reported gradual onset of headache about 3-4 weeks prior to  admission, left much greater than right, frontal and parietal and  occipital, which had been constant since onset and of variable  intensity.  He saw Stevphen Rochester, MD a couple of weeks prior to  admission and was treated with erythromycin for possible sinusitis.  The  erythromycin was finished 4 days prior to admission.   At the time of admission he noted increased intensity of the headache  over the previous 2-3 days, to the point of being quite severe.  He  called me on the evening of November 10, 2007 with these symptoms and  was instructed to go to the emergency room where a CT scan showed thin  bilateral acute-on-chronic subdural hematomas, with left extra-axial  fluid collection measuring about 7 mm in thickness and right measuring  about 6 mm in thickness, with no midline shift, no evidence for acute  ischemia, no intra-axial edema, and nonspecific findings consistent with  possible chronic microvascular ischemic demyelinization.  The  chronic  subdural hematomas extended from the low frontotemporal regions  bilaterally into the high frontoparietal vertex.   He also noted mild wooziness and unsteadiness but no significant gait  problem.  He noted mild left blurred vision and possibly slight  diplopia.  He also had slight nausea for a few days, although this may  have been from the recent erythromycin.  He noted slight loose stools  for about 2 days prior to admission, possibly also from the recent  erythromycin.   He had some improvement in headache with Percocet in the emergency room  but had an episode of vomiting and may not have kept it down entirely.  The headache then returned to the original level of severe intensity.   INR was 3.2 in the emergency department.   He recalled bumping his forehead, perhaps on the trunk lid of the car,  several (perhaps about 6) months ago.  He also had a minor slip and fall  about 3 weeks prior to admission but he is quite certain that that was  after the onset of the headache and  he did not hit his head at that  time.   PAST MEDICAL HISTORY:  1. Diabetes mellitus type 2 diagnosed in 2000 managed with Actos.  A1c      was up to 7.8% on October 19, 2007, at which time we planned to add      metformin, although that had not yet been started.  2. Dyslipidemia with highly atherogenic profile.  Recent NMR      lipoprotein profile on Lipitor, Zetia and Lovaza showed LDL      particle number [id number] with small LDL particle number 1489 which is a      worrisome profile, and because of that he had been instructed to      begin Niaspan but that had not yet been started.  3. Hypertension.  4. Atherosclerotic heart disease status post coronary artery bypass      graft about 1988, and redo graft of one vessel (saphenous vein to      posterior descending) in December 2007 per Ofilia Neas, MD., at the      time of his aortic valve replacement.  5. Status post aortic valve replacement for  severe aortic stenosis      December 2007 per Ofilia Neas, MD.  6. Postoperative atrial fibrillation in the context of volume overload      December 2007, resolved.  7. Abdominal aortic aneurysm followed by Liliane Bade, MD.  8. Anticoagulation since December 2007 due to the postoperative atrial      fibrillation as above.  (He does not need anticoagulation for his      bioprosthetic aortic valve).  9. Cerebrovascular occlusive disease with right-greater-than-left      internal carotid artery stenosis.  He presented in 2005 with      asymptomatic right carotid bruit.  Doppler in August 2005 showed      moderate to severe right ICA stenosis and moderate left ICA      stenosis.  He was seen then by Liliane Bade, MD, for vascular surgery      consultation and was advised to continue daily aspirin and continue      followup with him including repeat carotid Dopplers which were done      in at least March 2006, September 2006, and April 2007.  10.History of gout, controlled with allopurinol.  11.Allergic rhinitis, seasonal.  12.History of urinary frequency, fairly well controlled with Cardura.      PSA has been normal.  13.Carpal tunnel syndrome evaluated by Avie Echevaria, MD in 2003, much      improved or resolved with wrist splint.  14.Situational stress, chronic.  15.Status post remote herniorrhaphy.   DRUG SENSITIVITIES:  TOPROL-XL seemed to cause some fatigue and NORVASC  seemed to cause some headache several years ago.   MEDICATIONS ON ADMISSION:  1. Actos 30 mg daily.  2. Lipitor 20 mg daily.  3. Zetia 10 mg daily.  4. Lovaza 1 g 4 capsules daily.  5. Altace 2.5 mg b.i.d.  6. Coumadin 7.5 mg six days a week and 3.75 mg one day a week.  7. Lasix 40 mg daily.  8. Lanoxin 0.25 mg daily.  9. Ecotrin 81 mg daily.  10.Allopurinol daily.  11.Colchicine p.r.n.  12.Zyrtec 10 mg daily p.r.n.  13.Clarinex p.r.n.  14.Eyedrops for allergic rhinitis conjunctivitis per Stevphen Rochester,      MD  p.r.n.  15.Cardura 2 mg b.i.d. for BPH symptoms.   FAMILY HISTORY:  See admission note.   SOCIAL HISTORY:  See admission note.   REVIEW OF SYSTEMS:  On admission was otherwise unremarkable as  documented in admission note.   EXAMINATION ON ADMISSION:  He was alert with clear speech and normal  recall.  He was afebrile with stable vital signs.  There were bilateral  carotid bruits versus transmitted murmur.  There is a 4/6 harsh systolic  murmur and median sternotomy scar.  Neurologic was without focal deficit  with gait not initially tested.   ADMISSION LABORATORY:  Notable for INR 3.2.   EKG showed normal sinus rhythm with marked ST-T abnormality.   Chest x-ray showed no active disease.   Head CT as above.   HOSPITAL COURSE:  He was seen in consultation by Colon Branch, MD, from  neurosurgery.  Coumadin reversal was done with vitamin K daily for 3  days and fresh frozen plasma on the day of admission.  Of course the  Coumadin and aspirin were held.  He was given Decadron 4 mg IV q.6h.  which was decreased on 11/13/07 to 2 mg p.o. q.8h. for two additional  doses and then stopped.  He received some morphine and Zofran.  He was  able to tolerate full liquids and diet was advanced.  IV was at 80 per  hour and he was given insulin as needed.  He was seen in cardiology  consultation by Charlton Haws, MD, who was covering for Valera Castle, MD.  It was noted that currently there is no longterm indication to resume  the Coumadin and it was felt that it could be left off indefinitely.  More specifically, it was noted that since he has a tissue aortic valve  replacement there is no indication for anticoagulation for the valve  alone, and that it had been almost a year since the only documented  episode of atrial fibrillation.  It was recommended that aspirin 81 mg  be resumed when okay with neurosurgery, which likely would be at least 6-  8 weeks.  It was noted that it might be worthwhile  to do repeat carotid  duplex exam given the issue regarding his carotid stenoses and current  need to withhold antiplatelet and anticoagulant therapy.  Will defer  this to Liliane Bade, MD.   By 11/13/07, his headache had completely resolved, he felt fine and was  in good spirits, ambulating without difficulty with normal neurologic  exam.  INR was down to 1.0.  Blood sugars had gone up slightly, as high  as 207, in response to the Decadron.  By 11/14/07 he continued to have  essentially no headache and was continuing to ambulate without  difficulty.  He had no further GI symptoms.  CBGs were in the 100s.   CONDITION ON DISCHARGE:  Much improved.   DISCHARGE PLAN:  He is to increase activity slowly as tolerated.  He is  to continue heart-healthy diet.  He is to continue on the same  medications as prior to admission as listed above except he understands  he is not to take the Coumadin or aspirin.  We will hold off on the  Niaspan and metformin additions for now as these had not yet been  started as prescribed at his last outpatient visit.  Follow up with  Colon Branch, MD, in about 3 weeks subsequent to repeat CT scan.  Follow  up with [name], MD as previously scheduled in February  2009, or sooner if needed.  He is also instructed verbally and in  writing to  call Liliane Bade, MD to notify him that he is currently off of  aspirin and Coumadin, to see if there are any additional recommendations  regarding management of the carotid artery occlusive disease.  He  expresses understanding of this.   He is to call if he has recurrent headache or any other new symptoms.      [name], M.D.  Electronically Signed     MDA/MEDQ  D:  11/14/2007  T:  11/14/2007  Job:  [id number]   cc:   Clydene Fake, M.D.  Thomas C. Wall, MD, FACC  P. Liliane Bade, M.D.  Kerry Kass, M.D. [facility]

## 2011-04-28 NOTE — Assessment & Plan Note (Signed)
[name]                            CARDIOLOGY OFFICE NOTE   Nathaniel [name], Nathaniel [name]                         MRN:          [id number]  DATE:11/23/2007                            DOB:          [birth year]    Nathaniel [name] returns today after being discharged from the hospital for  bilateral chronic subdural hematomas.   He reports an episode of hitting a tree limb when he stood up after  hitting a golf shot in June 2008.  He paid no attention to it.  By Labor  Day, he was starting to have problems with headache.  He reported to the  hospital on November 11, 2007, with increasing headache.   He was watched for a couple of days and discharged home.  His Coumadin  obviously was discontinued which he was taking for postoperative atrial  fibrillation.  He is also off of aspirin.   Since discharge, he is feeling better.  He has followup scheduled with  Dr. Phoebe [name].   He is having no angina or anginal equivalence.   He had an abdominal ultrasound on October 13, 2007, to follow up on his  abdominal aortic aneurysm and it was stable at 3.4 x 3.4 with suggestion  to follow up in a year.  He had moderate stenosis of the left iliac.   His medications with the exception of no Coumadin or aspirin have  changed to no Altace as well.  I looked through the chart and I am not  sure why this was discontinued.  His renal function was normal.   PHYSICAL EXAMINATION:  VITAL SIGNS:  Blood pressure 152/90, pulse 88 and  regular, weight 185.  HEENT:  Normocephalic, atraumatic.  PERRLA, extraocular movements  intact.  Sclerae clear.  He is wearing glasses.  NECK:  Supple.  Carotid upstrokes are equal bilaterally without bruits.  There is no JVD.  Thyroid is not enlarged.  Trachea is midline.  LUNGS:  Clear.  HEART:  Regular rate and rhythm with a short, honking type systolic  murmur at the left lower sternal border.  No gallop.  ABDOMEN:  Soft, good bowel sounds.  EXTREMITIES:  No edema.  Pulses are present.  NEUROLOGIC:  Intact.   ASSESSMENT/PLAN:  Nathaniel [name] is doing well.  I am concerned about his  blood pressure.  I started him back on Altace 5 mg a day.  I will plan  on seeing back again in 3 months.   ADDENDUM:  I asked him to address specifically with Dr. Phoebe [name] when he  can go back on at least 81 mg of aspirin.     Thomas C. Daleen Squibb, MD, [facility]  Electronically Signed    TCW/MedQ  DD: 11/23/2007  DT: 11/24/2007  Job #: [id number]

## 2011-04-28 NOTE — Assessment & Plan Note (Signed)
[facility]                            CARDIOLOGY OFFICE NOTE   MANVIR, PRABHU                         MRN:          [id number]  DATE:11/01/2008                            DOB:          07-12-1932    Nathaniel [name] comes in for followup today.   He is doing remarkably well with no symptoms of coronary ischemia or  congestive failure.  His biggest concern is that he says, I am not  taking Zetia anymore because this is causing heart attacks based on a  200-patient study that he read about in the [employer].   He is quite humorous as always.  I told him at least stay on a statin.   His current problems are:  1. Coronary artery disease.  He is status post coronary bypass      grafting in 1988, redo graft of one-vessel vein graft to a      posterior descending in December 2007.  2. History of severe aortic stenosis status post bioprosthetic aortic      valve replacement.  3. Small abdominal aortic aneurysm which is asymptomatic.  We repeated      his ultrasound today and it is stable with a value of 3.5.  4. Postoperative atrial fibrillation which is quiescent.  5. Anticoagulation initially with Coumadin though now aspirin only      because of an accident with subdural hematomas.  He has been      followed by Dr. Phoebe Perch of Neurosurgery.  6. History of cerebrovascular disease which is currently asymptomatic.      His carotid Dopplers today shows stability with a 60-79% right      internal carotid artery stenosis, 40-59% left internal carotid      stenosis with antegrade flow in both vertebrals.   PHYSICAL EXAMINATION:  GENERAL:  He looks good.  He is in no acute  distress.  VITAL SIGNS:  His blood pressure is 128/80, his pulse is 60 and  irregular.  His weight is 198.  HEENT:  Normal.  NECK:  Carotids were equal bilaterally with bilateral bruits, right  greater than the left.  LUNGS:  Clear to auscultation and percussion.  HEART:  A normal  S1 and S2 with no diastolic murmur.  There is no  gallop.  ABDOMEN:  Soft.  Good bowel sounds.  No pulsatile mass.  No  organomegaly.  EXTREMITIES:  No edema.  Pulses are present 2+ or 4+ bilaterally  symmetrical.  NEUROLOGIC:  Intact.  SKIN:  Unremarkable.   He did not have a list of his meds and our office has called his  drugstore and updated these.  They are currently:  1. Allopurinol 300 mg a day.  2. Lipitor 20 mg a day.  3. Lanoxin 0.25 mg a day.  4. ICaps.  5. Altace 5 mg a day.  6. Enteric-coated aspirin 81 mg day.  7. Actos 30 mg a day.  8. Levoxyl 1000 mg b.i.d.  9. Doxazosin 2 mg in the morning and 1 mg in the evening.   As I mentioned,  he is no longer taking Zetia.   Nathaniel [name] is doing well from our standpoint.  I have arranged for him to  have an exercise rest stress Myoview 2 years out from his redo bypass.  We will also do a 2-D echo to follow up on his aortic valve.  I have  encouraged him to talk to Dr. Leslie Dales about the Zetia.  He will remain  on Lipitor.  I will see him back in 6 months.     Thomas C. Daleen Squibb, MD, [facility]  Electronically Signed    TCW/MedQ  DD: 11/01/2008  DT: 11/01/2008  Job #: [id number]   cc:   [name], M.D.

## 2011-04-28 NOTE — H&P (Signed)
NAMEKROSBY, RITCHIE                  ACCOUNT NO.:  [id number]   MEDICAL RECORD NO.:  [id number]          PATIENT TYPE:  INP   LOCATION:  3018                         FACILITY:  [facility]   PHYSICIAN:  [name], M.D.DATE OF BIRTH:  06/01/1932   DATE OF ADMISSION:  11/11/2007  DATE OF DISCHARGE:                              HISTORY & PHYSICAL   REASON FOR ADMISSION:  Bilateral subdural hematomas with severe  headache.   HISTORY:  This is a 77 year old man who has been on Coumadin since an  episode of postoperative atrial fibrillation, in the context of volume  overload, in December 2007 after undergoing aortic valve replacement and  redo coronary artery bypass graft surgery.  His INR has been followed  every 2 to 4 weeks by the [facility] with the last INR, by  patient history, done about 10 days ago.  He has not had any prior  bleeding complications.   He reports gradual onset of headache about 3 or 4 weeks ago, left much  greater than right, frontal and parietal and occipital, which has been  constant since then, of variable intensity.  He saw Stevphen Rochester, MD,  a couple weeks ago and was treated with erythromycin for possible  sinusitis.  The erythromycin was finished 4 days ago.   He now notes increased intensity of the headache over the past 2 to 3  days, to the point of being quite severe.  He called me last evening and  was instructed to go to the emergency room where a CT scan showed thin,  bilateral acute on chronic subdural hematomas, with left extra-axial  fluid collection measuring about 7 mm in thickness and right measuring  about 6 mm in thickness, with no midline shift, no evidence for acute  ischemia, no intra-axial edema, and nonspecific findings consistent with  possible chronic microvascular ischemic demyelinization.  The chronic  subdural hematomas extended from the low frontotemporal regions  bilaterally into the high frontoparietal  vertex.   He also has had some nausea over the last few days, although this may be  from the recent erythromycin.  He also reports some mild wooziness and  unsteadiness, but no significant gait problem.  He also notes mild left  blurred vision and possibly slight diplopia.  He notes slight loose  stools for the past 2 days, but again this likely is from the recent  course of erythromycin.   He had some improvement in headache with Percocet in the emergency room,  but had an episode of vomiting and may not have kept it down entirely.  The headache is currently back up to the original severe intensity.   INR was 3.2 in the emergency department.   He recalls bumping his forehead, perhaps on the trunk lid of a car,  several (perhaps about 6) months ago.  He also had a minor slip and fall  about 3 weeks ago, but that was, he believes, after the onset of the  headaches and he did not hit his head at that time.   PAST MEDICAL HISTORY:  1. Diabetes mellitus type 2 diagnosed in 2000 and managed with Actos.      Recent A1c on October 19, 2007 was up to 7.8; at which time, we had      planned to add metformin, although this has not yet been started.  2. Dyslipidemia with highly atherogenic profile.  Recent NMR      lipoprotein profile (on Lipitor, Zetia, and Lovaza) showed LDL      particle number [id number] with small LDL particle number 1489 which is a      worrisome profile and because of that he was instructed to begin      Niaspan, but that has not yet been started.  3. Hypertension.  4. Atherosclerotic heart disease status post MI age 73 and status post      coronary artery bypass graft about 1988 and redo graft (1 vessel      saphenous is vein graft to posterior descending coronary artery)      December 2007 per Ofilia Neas, MD, at the time of his aortic valve      replacement.  5. Status post aortic valve replacement for severe aortic stenosis in      December 2007 per Ofilia Neas, MD.   6. Postop atrial fibrillation in the context of volume overload      December 2007, resolved.  7. Abdominal aortic aneurysm.  8. Anticoagulation since December 2007 due to the postop atrial      fibrillation as above.  (My understanding is that he does not      necessarily need to be on anticoagulation for his aortic valve;      will get cardiology to clarify this).  9. Cerebrovascular occlusive disease with right greater than left      internal carotid artery stenosis.  He presented in 2005 with      asymptomatic right carotid bruit.  Doppler in August 2005 showed      moderate to severe right ICA stenosis and moderate left ICA      stenosis.  He was seen then by Liliane Bade, MD, for a Vascular      Surgery consultation and was advised to continue daily aspirin and      continue followup with him including repeat carotid Dopplers which      were done in March 2006, September 2006, and April 2007 at least.  10.History of gout, controlled with allopurinol.  11.Allergic rhinitis, seasonal.  12.History of urinary frequency, fairly well controlled with Flomax.      PSA results have been normal.  13.Carpal tunnel syndrome, evaluated by Avie Echevaria, MD, in 2003,      improved or resolved with wrist splint.  14.Situational stress.  15.Status post remote herniorrhaphy.   DRUG SENSITIVITIES:  TOPROL SEEMS TO CAUSE SOME FATIGUE AND NORVASC  SEEMED TO CAUSE SOME HEADACHE SEVERAL YEARS AGO.   MEDICATIONS:  1. Actos 30 mg daily.  2. Lipitor 20 mg daily.  3. Zetia 10 mg daily.  4. Lovaza 1 gm 4 capsules daily.  5. Altace 2.5 mg b.i.d.  6. Coumadin 7.5 mg 6 days a week and 3.75 mg 1 day per week.  7. Lasix 40 mg daily.  8. Lanoxin 0.25 mg daily.  9. Ecotrin 81 mg daily.  10.Allopurinol daily.  11.Colchicine p.r.n.  12.Zyrtec 10 mg daily p.r.n.  13.Clarinex p.r.n.  14.Eye drops per Stevphen Rochester, MD, p.r.n. for allergic      rhinoconjunctivitis.  15.Flomax 0.4 mg daily.   FAMILY HISTORY:  Father died of aortic aneurysm at age 45.  Mother died  of angina and congestive heart failure at age 22.  He has no brothers.  One sister status post coronary angioplasty.  He has 2 children, both of  whom are adopted.   SOCIAL HISTORY:  He has been widowed since 58.  He has been helping  raise 3 granddaughters since 36 with a long history of multiple  situational family stresses.  He is self-employed as an Engineer, civil (consulting).  He  quit smoking in 1992.  He drinks 2 glasses of wine each evening.   REVIEW OF SYSTEMS:  Appetite has been good.  Energy level has been good.  SKIN:  No complaints.  EYES:  As above with no other complaints.  CARDIOPULMONARY:  As above.  He denies dyspnea, cough, chest pain,  palpitations, or lower extremity edema.  GASTROINTESTINAL:  Negative  except for current nausea and slightly loose stools as above.  GENITOURINARY:  Frequency and nocturia fairly well controlled as above.  No dysuria or obstructive symptoms.  NEUROLOGIC:  As above.  Denies  weakness, numbness, or paresthesias.   PHYSICAL EXAM:  GENERAL:  He was sleeping, but easily awakened, alert  with clear speech and normal recall.  VITAL SIGNS:  Temperature 98.3, heart rate 65, respiratory rate 20,  blood pressure 134/91.  SKIN:  Negative.  HEENT:  Eyes normal externally with fundi not examined.  Oropharynx  negative.  NECK:  Carotid upstrokes normal with bruits versus transmitted murmur.  No thyromegaly.  LUNGS:  Unlabored, clear.  HEART:  A 4/6 harsh systolic murmur with regular rate and rhythm.  Median sternotomy.  ABDOMEN:  Soft, nontender, no mass.  EXTREMITIES:  No edema with decreased pedal pulses.  NEUROLOGIC:  Without focal deficits.  Gait is not tested.   LAB:  WBC 5.6, hemoglobin 13.9, INR 3.2.  Sodium 135, potassium 4, BUN  and creatinine normal.  Cardiac enzymes normal.  Digoxin level 1.3.   EKG normal sinus rhythm with marked ST-T abnormality.   Chest x-ray shows no disease.   Head  CT as above.   ASSESSMENT:  1. Bilateral chronic (with probable minor acute component) subdural      hematomas in anticoagulated patient, possibly due to minor head      trauma several months ago.  2. Severe headache secondary to #1.  3. Atherosclerotic heart disease status post coronary artery bypass      graft 1988 and redo saphenous vein graft to posterior descending in      December 2007.  4. Status post aortic valve replacement for severe aortic stenosis      with pericardial tissue valve December 2007.  5. Postop atrial fibrillation episode (in context of volume overload)      December 2007, resolved.  6. Anticoagulated since December 2007 due to postop atrial      fibrillation as above.  7. Abdominal aortic aneurysm.  8. Cerebrovascular occlusive disease with right greater than left      internal carotid artery stenoses.  9. Hypertension.  10.Diabetes mellitus type 2 diagnosed in 2000.  11.Dyslipidemia with highly atherogenic pattern.  12.History of gout.  13.Chronic situational stress history.   PLAN:  Management has been discussed with Colon Branch, MD  (Neurosurgery).  Coumadin reversal is in progress with vitamin K and 2  units of fresh frozen plasma in progress.  Of course, we are holding the  Coumadin and aspirin.  He has had Decadron 4 mg IV 1 dose so  far to be  continued q.6 hours for at least the next 24 hours.  Pain management  with Percocet and morphine if needed and Zofran.  Full liquids today,  n.p.o. after midnight since he  may need surgical drainage of the subdural hematomas, possibly to be  done as early as tomorrow pending the symptomatic control of his  headaches.  IV at 80 per hour, insulin as needed.  We will notify Valera Castle, MD, particularly in regards to the need to stop the Coumadin and  the aspirin.      [name], M.D.  Electronically Signed     MDA/MEDQ  D:  11/12/2007  T:  11/12/2007  Job:  [id number]   cc:   Clydene Fake, M.D.  Thomas C. Wall, MD, FACC  P. Liliane Bade, M.D.  Kerry Kass, M.D. [facility]

## 2011-04-30 ENCOUNTER — Ambulatory Visit: Payer: Medicare Other | Admitting: Gastroenterology

## 2011-05-01 NOTE — Letter (Signed)
October 11, 2006    [name], M.D.  [address], [address]  [place], [place] [address]   RE:  CALDWELL, KRONENBERGER  MRN:  [id number]  /  DOB:  Jun 05, 1932   Dear Casimiro Needle:   Your patient Jahmarion Popoff was in the office on October 11, 2006, for  cardiac evaluation.  As you know, he is a very pleasant 77 year old white  male with multivessel coronary artery disease 19 years post CABG by Dr.  Particia Lather.  At that time he had an LIMA anastomosed to the LAD, a  saphenous vein graft to the diagonal, intermediate to first OM, as well as a  saphenous vein graft to the posterior descending.  He has known occlusion of  the vein graft to the diagonal, as well as occlusion of the sequential  portion of the vein graft to the obtuse marginal and diagonal.  Other grafts  have been patent.  Recently he has noted progression of dyspnea on exertion,  particularly when playing golf, and also occasionally waking at night with  shortness of breath.  He attributes some of this to a new mattress; however,  he has know aortic stenosis and it was felt that further evaluation was  indicated.  Thus, catheterization was performed on October 23 per Dr.  Eden Emms.  Right heart catheterization revealed pulmonary pressure 56/18,  pulmonary capillary wedge of 18, LV pressure was 174/10 with post A EDP at  30.  Aortic pressure was 114/56.  The left main was occluded, the RCA was  occluded, the saphenous vein graft to the RCA was 100% occluded, the SVG to  the intermediate was widely patent.  The internal mammary was widely patent  to the LAD.  The distal LAD had 50-60% diffuse disease, fairly good  collaterals to the RCA from the LIMA.  There was significant calcification  of the aortic valve.  He had an echocardiogram today confirming the  significant aortic stenosis with peak AV velocity at 385, area of 0.8, mean  aortic valve gradient of 34, peak of 59.  There was severe hypokinesis of  the inferior wall,  mild hypokinesis of the posterior wall, EF was 55%.  There was mild to moderate mitral calcification with mild to moderate mitral  regurgitation.   EXAMINATION:  VITAL SIGNS:  Reveal blood pressure 123/73, pulse 70, normal  sinus rhythm.  GENERAL APPEARANCE:  Normal.  NECK:  JVP is not elevated.  Carotid pulses are markedly diminished with  bruit on the right.  He does have known carotid disease.  LUNGS:  Clear.  CARDIAC:  Reveals 3-4/6 long harsh systolic ejection murmur at the aortic  area.  There was no S3 or S4 and no mitral murmur.  ABDOMEN:  Revealed no pulsation, no bruit.  EXTREMITIES:  Normal.   IMPRESSION:  1. Severe aortic stenosis with valve area of approximately 0.8 and      moderate pulmonary hypertension, now symptomatic.  2. Nineteen years post coronary artery bypass grafting with anatomy as      described above.  3. Diabetes.  4. Abdominal aortic aneurysm.  5. Hypertension.  6. Hyperlipidemia.   I have reviewed the patient with Dr. Eden Emms and Dr. Gala Romney.  We plan to  get pulmonary function studies.  If these are normal, certainly aortic valve  replacement would be indicated.  We also plan to obtain abdominal  ultrasound.   Thank you for the opportunity to share this nice gentleman's care.  I will  see him again next week and we will decide upon definite course of therapy.  In the interim I have given him some Lasix 20 mg every-other day and  suggested that he restrict his sodium intake.    Sincerely,     ______________________________  E. Graceann Congress, MD, [facility]    EJL/MedQ  DD: 10/11/2006  DT: 10/11/2006  Job #: [id number]

## 2011-05-01 NOTE — Op Note (Signed)
[name], Nathaniel [name]                  ACCOUNT NO.:  [id number]   MEDICAL RECORD NO.:  [id number]          PATIENT TYPE:  OIB   LOCATION:  2899                         FACILITY:  [facility]   PHYSICIAN:  Alford Highland. Rankin, M.D.   DATE OF BIRTH:  [birth year]   DATE OF PROCEDURE:  11/24/2004  DATE OF DISCHARGE:  11/24/2004                                 OPERATIVE REPORT   PREOPERATIVE DIAGNOSIS:  Epiretinal membrane, left eye.   POSTOPERATIVE DIAGNOSIS:  Epiretinal membrane, left eye.   PROCEDURE:  Posterior vitrectomy with membrane peel of posterior hyaloid  sheet as well as internal limiting membrane, left eye.   SURGEON:  Alford Highland. Rankin, M.D.   ANESTHESIA:  Local with retrobulbar block, monitored anesthesia care.   INDICATIONS FOR PROCEDURE:  The patient is a 77 year old man who has  significant distortion of the macular region causing significant impairment  of his activities of daily living.  He understands the risks of surgery  including to but not limited to hemorrhage, infection, scarring, need for  further surgery, no change in vision, loss of vision, progression of disease  despite intervention.  Appropriate signed consent was obtained.  He was  taken to the operating room.   DESCRIPTION OF PROCEDURE:  In the operating room, appropriate monitoring was  followed by mild sedation.  0.75% Marcaine was delivered 5 mL retrobulbar  and an additional 5 mL laterally fashioned in modified [employer]  The left  periocular region was sterilely prepped and draped in the usual ophthalmic  fashion.  A lid speculum was applied to the left eye.  A 25 gauge vitrectomy  system was begun by using the trocar system in the inferotemporal quadrant.  Placement of the infusion was verified visually prior to beginning the  infusion.  The superior trocar was placed.  The microscope was placed into  position with the BIOM attached.  A core vitrectomy was then begun.  No  apparent need for posterior hyaloid  removal was noted as it appeared to have  occurred spontaneously.  The vitreous skirt was trimmed 360 degrees.  At  this time, attempts were made to use the 25 gauge needle end grasping  forceps to engage the internal limiting membrane.  Because of the long  actual length of the eye, this was not possible with the instrumentation  available.  For this reason, superotemporal trocar was removed.  A  conjunctival peritomy was fashioned in the superotemporal quadrant and the  previous opening in the sclera was enlarged with a 20 gauge MVR blade.  This  allowed for [facility] forceps to be used to engage the internal limiting membrane  and elements apparently of hyaloid on top of this and removed from the  macular region.  Significant distortion and topographic change was improved.  At this time, the peripheral retina was inspected and found to be free of  retinal holes and tears.  The instruments were removed from the eye.  The  superior sclerotomy in the superonasal quadrant was closed with 7-0 Vicryl  suture.  The superotemporal trocar was now removed  and was the infusion and  no complications occurred.  Subconjunctival injection of antibiotics were placed away from the  sclerotomy sites.  The superonasal peritomy had been closed, also, with 7-0  Vicryl.  A sterile patch and Fox shield were applied.  The patient was taken  to the short stay area to be discharged home as an outpatient.       GAR/MEDQ  D:  11/24/2004  T:  11/24/2004  Job:  [id number]

## 2011-05-01 NOTE — Assessment & Plan Note (Signed)
[facility]                            CARDIOLOGY OFFICE NOTE   MAKEL, MCMANN                         MRN:          [id number]  DATE:02/10/2007                            DOB:          07-29-1932    HISTORY OF PRESENT ILLNESS:  Nathaniel [name] is a very pleasant 77 year old  white male, nearly three months post aortic valve replacement with a  21mm pericardial tissue valve and re-do SVG to the posterior descending  branch of the right coronary artery per Dr. Tyrone Sage.  The patient  initially had CABG 19 years ago by Dr. Particia Lather.   The patient had postop atrial fibrillation requiring readmission  associated with volume overload.  He has had no recurrence.  He does  have diagnosis of type 2 diabetes, abdominal aortic aneurysm, gout,  chronic back pain.   His cardiac status is quite good.  He has started playing golf again. He  is having some family stress with his granddaughters.  Postop echo  revealed LV EF of 50%.  He had a 20 mm mean gradient across the aortic  valve.   Dr. Leslie Dales suggested Cardura because of her frequency.   MEDICATIONS:  1. Coumadin.  2. Lanoxin 0.25.  3. Actos 30.  4. Zetia 10 mg.  5. Allopurinol 300 mg.  6. Lipitor 20 mg.  7. Altace two puffs b.i.d.  8. Lasix 40 mg.   ALLERGIES:  THE PATIENT HAS NOT BEEN ABLE TO TOLERATE TOPROL IN THE  PAST.   PHYSICAL EXAMINATION:  VITAL SIGNS:  Blood pressure 152/78, pulse 79,  normal sinus rhythm.  GENERAL APPEARANCE:  Normal.  NECK:  JVP is not elevated.  Carotid pulses elevated without bruits.  LUNGS:  Clear.  CARDIAC:  A 2/6 moderate length systolic ejection murmur in theAortic  area and left sternal border.  No diastolic murmur.  ABDOMEN:  Normal.  EXTREMITIES:  Normal.   IMPRESSION:  Diagnoses as above.  His blood pressure is mildly elevated  today.   PLAN:  We plan to get a Holter monitor and if no atrial fibrillation  discontinue his Coumadin.  Also, he will  start the Cardura per Dr.  Leslie Dales.  Plan a BMP.  I will see him back in two months or p.r.n.  Note EKG today reveals normal sinus rhythm with LVH in strain.     Cecil Cranker, MD, [facility]  Electronically Signed    EJL/MedQ  DD: 02/10/2007  DT: 02/10/2007  Job #: [id number]   cc:   [name], M.D.

## 2011-05-01 NOTE — Letter (Signed)
December 15, 2006    [name], M.D.  [address]  [place], [place] [address]   RE:  TIGE, MEAS  MRN:  [id number]  /  DOB:  Jul 28, 1932   Dear Kathlene November,   I was pleased to see our mutual patient, Montavius Subramaniam, on December 15, 2006,  four weeks post aortic valve replacement with a 21 mm Magna pericardial  tissue valve and re-do SVG to posterior descending coronary artery per  Dr. Tyrone Sage.  As you know, the patient is 19 years post-CABG by Dr.  Particia Lather.   The patient has a history of type 2 diabetes and gout.  He also has  abdominal aortic aneurysm, which had been followed closely, and has  bilateral carotid stenosis, followed by Dr. Madilyn Fireman.   As you know, he was readmitted on December 12 with new-onset atrial  fibrillation and volume overload.   The patient was treated with IV diltiazem, converted promptly, and was  discharged on December 14 and has had no recurrent symptoms.  He is  feeling quite well at this time with no shortness of breath or chest  discomfort and no ankle edema.   He has been unable to tolerate Toprol.   1. He is on Coumadin.  2. Lanoxin 0.25 daily.  3. Aspirin 325, which we will decrease to 81.  4. Actos 30.  5. Zetia 10.  6. Allopurinol 300.  7. Lipitor 20.  8. Altace 2.5 b.i.d.  9. Nu-Iron, which has been discontinued.  10.Lasix 40.   PHYSICAL EXAMINATION:  Blood pressure 120/66, pulse 100.  GENERAL APPEARANCE:  Normal.  JVP is not elevated.  Carotid pulses  palpable and equal, without bruits.  LUNGS:  Clear.  CARDIAC EXAM:  Reveals 1-2/6 short systolic ejection murmur, aortic area  and left sternal border.  No diastolic murmur.  ABDOMINAL EXAM:  Normal.  EXTREMITIES:  No edema.   IMPRESSION/DIAGNOSES:  As above.  The patient is much improved with no  evidence of volume overload.  His postoperative echo, done during his  hospitalization, revealed normal LV function.  His recent renal profile,  December 02, 2006, was  normal.   We plan to continue on the same therapy.  We will see him back in two to  three weeks or p.r.n.   I should note that his EKG shows LVH and strain.    Sincerely,      E. Graceann Congress, MD, [facility]  Electronically Signed    EJL/MedQ  DD: 12/15/2006  DT: 12/15/2006  Job #: [id number]

## 2011-05-01 NOTE — Discharge Summary (Signed)
NAMEMIHAILO, SAGE                  ACCOUNT NO.:  [id number]   MEDICAL RECORD NO.:  [id number]          PATIENT TYPE:  INP   LOCATION:  3703                         FACILITY:  [facility]   PHYSICIAN:  Everardo Beals. Juanda Chance, MD, FACCDATE OF BIRTH:  02-11-1932   DATE OF ADMISSION:  11/24/2006  DATE OF DISCHARGE:  11/26/2006                               DISCHARGE SUMMARY   PRIMARY CARDIOLOGIST:  Dr. Graceann Congress.   CONSULTING PHYSICIAN:  Dr. Tyrone Sage, CVTS.   PRIMARY DIAGNOSIS:  New onset atrial fibrillation status post aortic  valve repair and coronary artery bypass graft.   SECONDARY DIAGNOSES:  1. Coronary artery disease.      a.     Recent redo of coronary artery bypass grafting x1 with       saphenous vein graft to posterior descending.  2. Aortic valve replacement with a 21-mm Magna pericardial tissue      valve.  3. Endoscopic vein harvest of the right thigh.  4. Diabetes mellitus.  5. History of abdominal aortic aneurysm measuring 3.5 x 3.3 cm.  6. Bilateral carotid stenosis.  7. History of gout.  8. History of peripheral edema with volume overload.   PROCEDURES PERFORMED DURING HOSPITALIZATION:  None.   HISTORY OF PRESENT ILLNESS:  This is a 77 year old Caucasian male  patient with above-mentioned diagnoses and history who is admitted at  the request of Dr. Graceann Congress on November 24, 2006, secondary to new  onset atrial fibrillation with rapid ventricular response probably 24 to  36 hours in duration.  The patient had been noticing more shortness of  breath and complaints of coughing and was unable to sleep and had to sit  up in a chair.  After being seen in the office, Dr. Corinda Gubler admitted him  for further evaluation and treatment.   HOSPITAL COURSE:  On arrival to the hospital, the patient's vital signs  revealed a blood pressure of 112/67, heart rate was 120 beats per  minute, irregular.  The patient weighed 215.4 pounds.  The patient was  begun on a Cardizem drip,  started on heparin protocol, and monitored.  The patient was also started on IV Lasix 80 mg IV.   The patient's cardiovascular surgeon, Dr. Tyrone Sage, was consulted for  his input, and he was seen by Dr. Tyrone Sage on November 24, 2006.  Dr.  Tyrone Sage agreed with use of anticoagulation and beginning of Coumadin  secondary to tissue valve.  Later in the evening of November 24, 2006,  the patient converted to normal sinus rhythm after Cardizem bolus, and  his Diltiazem was changed to p.o.   The patient was started on Coumadin and was being followed by pharmacy  concerning his doses and PT/INR.  On November 25, 2006, the patient was  feeling much better, had diuresed with a weight down to 199.0 pounds.  The patient's Cardizem was discontinued, and the patient's Toprol was  increased from 25 mg once a day to 50 mg once a day.  The patient's  troponins were found to be negative, and his BNP was down to 633.  On the morning of discharge, the patient was seen and examined by Dr.  Charlies Constable.  The patient's Lasix was changed to 40 mg p.o., and  digoxin 0.25 mg was added to his medication regimen. The patient was  found to be stable for discharge.  His BNP on discharge was 561.   LABORATORY DATA ON DISCHARGE:  Hemoglobin 8.5, hematocrit 25.4, white  blood cells 9.5, platelets 467, PT 16.1, INR 1.3.  Sodium 139, potassium  3.7, chloride 95, CO2 32, glucose 100, BUN 15, creatinine 1.1.  The  patient's bilirubin was 1.0, alkaline phosphatase 103, SGOT 39, SGPT 31,  total protein 6.2, with an albumin of 3.4.  BNP was found to be 561.   VITAL SIGNS ON DISCHARGE:  The patient's blood pressure was 116/63,  pulse 63 and regular, respirations 20. The patient's weight, as stated  above, 199 pounds.   FOLLOWUP DISCHARGE INSTRUCTIONS AND APPOINTMENTS:  1. The patient is scheduled to be seen in the Coumadin clinic as a new      patient on Monday, December 17, at 11 a.m.  The patient has been      given  instructions concerning this and need to follow up with them      concerning his bleeding time.  2. The patient is to be seen by Dr. Graceann Congress on December 19 at      3:15.  This was a previously scheduled appointment that he is going      to keep.  3. The patient's has been counseled on any bleeding from his mouth,      nose, or in his stool that needs to be reported to our office      immediately.   DISCHARGE MEDICATIONS:  1. Actos 30 mg once a day.  2. Zetia 10 mg once a day.  3. Allopurinol 300 mg once a day.  4. Zocor 40 mg once a day.  5. Altace 2.5 mg once a day.  6. Iron complex 150 mg once a day.  7. Lasix 40 mg once a day (new prescription).  8. Coumadin 7.5 mg once a day (new prescription).  9. Toprol XL 50 mg once a day (new prescription).  10.Lanoxin 0.25 mg daily. (new prescription).  11.Oxycodone 5/500 mg p.r.n. pain.   ALLERGIES:  IT STATES TOPROL CAUSING LETHARGY.  THE PATIENT HAS BEEN ON  TOPROL PRIOR TO DISCHARGE AND DURING ADMISSION.   Time spent with patient including physician time greater than 35  minutes.      Bettey Mare. Lyman Bishop, NP      Everardo Beals. Juanda Chance, MD, [facility]  Electronically Signed    KML/MEDQ  D:  11/26/2006  T:  11/27/2006  Job:  [id number]   cc:   Sheliah Plane, MD  Cecil Cranker, MD, [facility]

## 2011-05-01 NOTE — Cardiovascular Report (Signed)
[facility]  Patient:    Nathaniel [name], Nathaniel [name] Visit Number: [id number] MRN: [id number]          Service Type: CAT Location: [facility] 2861 [id number] Attending Physician:  Lenoria Farrier Dictated by:   Noralyn Pick Eden Emms, M.D. [facility] Proc. Date: 11/29/01 Admit Date:  11/29/2001 Discharge Date: 11/29/2001   CC:         [name], M.D.  Cecil Cranker, M.D. [facility]   Cardiac Catheterization  PROCEDURE PERFORMED:  Coronary arteriography.  INDICATION:  Known coronary artery disease with previous CABG, with abnormal Cardiolite study with inferior and apical ischemia.  A note should be made that the patient really has not had any significant angina and his Cardiolite study had previously been abnormal in the inferior wall, but was slightly worse with new apical ischemia.  DESCRIPTION OF PROCEDURE:  The case was done using standard Judkins catheters from the right femoral artery.  A right bypass catheter was used for the vein graft to the right coronary artery and we did exchange a LIMA catheter for selective injection of the internal mammary artery.  RESULTS:  The native left main was known to be occluded from previous catheterization in 1996 and was not injection.  The native right coronary artery was 100% occluded proximally, with faint right-to-right collaterals.  The circumflex coronary artery was 100% occluded.  The LAD was 100% occluded.  Saphenous vein graft to the intermediate branch was normal.  The distal sequential portions to the obtuse marginal branch and diagonal were occluded. This was seen previously in 1996.  Saphenous vein graft to the right coronary artery had some distal graft artery mismatch with about a 30% stenosis.  The posterolateral branch was 100% occluded with faint collaterals, which was new from previous.  However, the posterolateral branch did fill with excellent collaterals from the LIMA graft insertion through  septal perforators.  The left internal mammary artery was widely patent to the LAD.  There was diffuse 60-70% disease in the distal LAD, which was similar to previous as well.  RAO VENTRICULOGRAPHY:  RAO ventriculography revealed normal wall motion with an EF of 65%.  There was no gradient across the aortic valve.  Aortic pressure was 145/74. LV pressure was 145/23.  At the request of the patient, the right femoral artery was perclosed with good hemostasis.  The risk of infection was discussed, but the patient cares for his three grandchildren and is the primary Nathaniel [name] and wanted to go home as soon as possible.  IMPRESSION:  The patients arteriogram is not that much different from 1996. I suspect the inferior wall ischemia is from the extensive collateralization and he has diffuse distal left anterior descending artery disease.  There is no discrete lesion to angioplasty and he has not been having a lot of chest pain.  I think continued medical therapy is warranted. Dictated by:   Noralyn Pick Eden Emms, M.D. [facility] Attending Physician:  Lenoria Farrier DD:  11/29/01 TD:  11/29/01 Job: [id number]

## 2011-05-01 NOTE — H&P (Signed)
[facility] ADMISSION   BARNES, FLOREK                         MRN:          [id number]  DATE:11/24/2006                            DOB:          July 27, 1932    ADDENDUM:   ADDITIONAL DIAGNOSES:  1. Abdominal aortic aneurysm.  2. Bilateral carotid stenosis.   The abdominal aneurysm measures 3.5 x 3.3.     E. Graceann Congress, MD, [facility]     EJL/MedQ  DD: 11/24/2006  DT: 11/24/2006  Job #: [id number]

## 2011-05-01 NOTE — Letter (Signed)
January 04, 2007    [name], M.D.  [address], [address]  [place],  [place] [address]   RE:  Nathaniel [name], Nathaniel [name]  MRN:  [id number]  /  DOB:  Apr 04, 1932   Dear Nathaniel [name]:   It is a pleasure to our mutual patient, Nathaniel [name], for followup on  January 04, 2007, seven weeks post-op aortic valve replacement with 21  mm Magna pericardial tissue valve and  SVG to posterior descending  coronary artery per Dr. Tyrone Sage. The patient had initial CABG by Dr.  Particia Lather 19 years ago.   The patient also has a history of Type 2 diabetes and gout as well as  abdominal aortic aneurysm and bilateral carotid disease followed by Dr.  Madilyn Fireman. On December 12, he was re-admitted with atrial fibrillation and  volume overload responding promptly to IV diltiazem and diuretics. The  patient has continued to improve and feels quite well with no cardiac  symptoms.   His medications include Actos 30, Zetia 10, allopurinol 300, Lipitor 20,  Zyrtec and Altace 2.5 b.i.d. and Lasix 40, Coumadin and Lanoxin 0.25  daily.   His physical examination: Blood pressure is 142/78, pulse 88 normal  sinus rhythm.  GENERAL APPEARANCE: Normal.  JVP not elevated. Carotid pulses palpable and equal without bruits.  LUNGS: Clear.  CARDIAC: A 2/6 systolic ejection murmur in the aortic area and left  sternal border. No diastolic murmur.  ABDOMEN: Normal.  EXTREMITIES: Normal.   Impression: Diagnoses as above. The patient continues to improve. He has  had no recurrent atrial fibrillation. His blood pressure is mildly  elevated today.   I suggest that he continue on the same therapy. I will plan to see him  back in 3 to 4 weeks. He is also to see you within the next couple of  weeks and states that he is to have a lipid profile and a renal profile.   I certainly hope [name] continues to improve. We will consider  discontinuing the Coumadin after a few more months.    Sincerely,      E. Graceann Congress, MD,  [facility]  Electronically Signed    EJL/MedQ  DD: 01/04/2007  DT: 01/04/2007  Job #: [id number]

## 2011-05-01 NOTE — Assessment & Plan Note (Signed)
[facility]                            CARDIOLOGY OFFICE NOTE   Nathaniel [name], Nathaniel [name]                         MRN:          [id number]  DATE:04/01/2007                            DOB:          01-22-1932    Nathaniel [name] comes in today as an add on.  He is 38-years of age and has  had an aortic valve replacement with a 21 mm pericardial tissue valve  last fall. He also had a saphenous vein graft to a posterior branch of  the right coronary artery by Dr. Tyrone Sage.  He has had previous bypass  surgery 19 years ago by Dr. Andrey Campanile.   This morning while he was sitting at the computer he became light  headed, dizzy, and may have had just a trace of vertigo. It only lasted  a minute or so.  He did not have a syncopal event.  He has noticed this  off and on at various times of the day.  It seems to be getting more  frequent.   He denied any other neurological complaints at this time including  double vision, difficulty speaking, swallowing, amaurosis fugax or  peripheral manifestations.   OTHER MEDICAL PROBLEMS INCLUDE:  1. Postoperative atrial fibrillation without recurrence.  2. Type 2 diabetes.  3. Abdominal aortic aneurysm that is stable.  4. Gout.  5. Chronic back pain.   He plays golf on a regular basis. He has had no exertional dizziness or  chest discomfort.   CURRENT MEDICATIONS:  1. Coumadin with an INR of 2.3 today.  2. Lanoxin 0.25 mg daily.  3. Actos 30 mg daily.  4. Zetia 10 mg daily.  5. Allopurinol 20 mg daily.  6. Lipitor 20 mg daily.  7. Altace 2.5 mg b.i.d.  8. Lasix 40 mg daily.  9. Cardura (unknown dose but he has previously written 2 mg daily for      bladder relaxation).   GENERAL:  He is in no acute distress today. His skin color is ruddy. His  skin is warm and dry.  VITAL SIGNS:  His blood pressure is 114/71 with a pulse of 71 lying  down, drops to 104/70 with a pulse of 86 with standing and then  subsequently increases to  111/72, 2 minutes after standing.  His heart  rate was 94 however. EKG shows sinus rhythm, diffuse ST segment changes  inferior anterolaterally. This has actually improved since his last ECG.  HEENT:  Normocephalic, atraumatic. PERRLA. Extraocular movements are  intact. Sclerae are injected. Facial symmetry is normal.  NEUROLOGIC EXAM:  Is grossly intact.  NECK:  Supple. Carotids are full with soft systolic sounds. Thyroid is  not enlarged. Trachea is midline.  LUNGS:  Clear.  HEART:  Reveals a normal S1, soft systolic murmur, split S2.  ABDOMEN:  Soft with good bowel sounds.  EXTREMITIES:  Show no clubbing, cyanosis or edema. Pulses are intact.  NEURO EXAM:  Intact.   He has had carotids followed with Dr. Jake Samples and these are stable.   ASSESSMENT AND PLAN:  1. Orthostatic hypotension which  is symptomatic.  2. Other problems as listed above.   RECOMMENDATIONS:  1. Stay well hydrated, particularly after playing golf or being      outside.  2. Talk to Dr. Leslie Dales who has prescribed his Cardura to see if we      can switch this to something else that would cause less orthostatic      hypotension.  I would suggest Flomax or Avodart if that is indicated.  1. Follow with Dr. Glennon Hamilton as scheduled in May.     Thomas C. Daleen Squibb, MD, [facility]  Electronically Signed    TCW/MedQ  DD: 04/01/2007  DT: 04/01/2007  Job #: [id number]   cc:   Cecil Cranker, MD, [facility]  [name], M.D.

## 2011-05-01 NOTE — Discharge Summary (Signed)
NAMEMICHAE, Nathaniel [name] NO.:  [id number]   MEDICAL RECORD NO.:  [id number]          PATIENT TYPE:  INP   LOCATION:  2031                         FACILITY:  [facility]   PHYSICIAN:  Sheliah Plane, MD    DATE OF BIRTH:  Mar 13, 1932   DATE OF ADMISSION:  11/16/2006  DATE OF DISCHARGE:  11/22/2006                               DISCHARGE SUMMARY   PRIMARY ADMITTING DIAGNOSIS:  Aortic stenosis,   ADDITIONAL AND DISCHARGE DIAGNOSES:  1. Aortic stenosis.  2. Coronary artery disease.  3. Type 2 diabetes mellitus.  4. Known history of abdominal aortic aneurysm.  5. History of gout.  6. History of chronic back pain.  7. Postoperative blood loss anemia.   PROCEDURES PERFORMED:  1. A redo coronary artery bypass grafting x1 (saphenous vein graft to      the posterior descending).  2. Aortic valve replacement was 21 mm Magna pericardial tissue valve.  3. Endoscopic vein harvest, right thigh.   HISTORY:  The patient is a 77 year old white male with a known history  of coronary artery disease who is status post CABG in 1988 by Dr.  Andrey Campanile.  He has done well up until about the past 3 months when he began  to notice increasing shortness of breath with exertion.  He also has  associated dizziness and lightheadedness.  He was seen recently in  followup by Dr. Corinda Gubler, and a repeat echocardiogram was performed to  follow up his known history of aortic stenosis.  He subsequently  underwent cardiac catheterization on October 06, 2006, and was found to  have an ejection fraction of 50% with a heavily calcified aortic valve  and single vessel coronary artery disease.  He was referred to Dr.  Sheliah Plane for consideration of redo sternotomy with coronary  artery bypass and aortic valve replacement at this time.  Dr. Tyrone Sage  reviewed his films and agreed that his best course of action would be to  proceed with surgery.  He explained the risks, benefits and alternatives  to the  patient, and he agreed to proceed.   HOSPITAL COURSE:  Nathaniel [name] was admitted to [facility] on  November 16, 2006, and was taken to the operating room where he underwent  CABG x1 and aortic valve replacement as described in detail above.  He  tolerated the procedure well and was transferred to the SICU in stable  condition.  He was able to be extubated shortly after surgery.  He  initially required multiple drips including Levophed, milrinone,  dopamine, and Neo-Synephrine. These were weaned and discontinued over  the course of his first postoperative day.  He stayed in the unit for  further observation.  His chest tubes and hemodynamic monitoring devices  were removed.  His blood pressure remained marginal, and he was not  started on a beta blocker initially.  However, this was restarted on  postop day #3.  He was started at a low dose, and his blood pressure has  slowly begun to trend upward. Therefore, his beta-blocker dose has also  been titrated up  as tolerated.  Presently his blood pressure has  remained stable enough that his ACE inhibitor could be restarted back at  a low dose as well.   Postoperatively, he has made slow but steady progress.  He was  transferred to the floor on postop day #3.  He has been ambulating with  assistance and is making good progress.  His home diabetes medications  have been restarted, and his blood sugars have been stable.  He has  remained afebrile, and all vital signs have been stable.  He is  maintaining normal sinus rhythm.  His incisions are all healing well.  He has been somewhat volume overloaded and was started on Lasix.  He  continues to remain approximately 3 kg above preoperative weight with  evidence of lower extremity edema on physical exam.  His most recent  labs on postop day #4 show hemoglobin of 8.5 and hematocrit 24.6 for  which he has been started on supplemental iron.  Also, white blood cell  count 8.3. platelets 153.  Sodium 136, potassium 4.4, BUN 19, creatinine  1.0.  His most recent chest x-ray was stable with bibasilar atelectasis.  It is felt that he continues to remain stable over the next 24 hours, he  will hopefully be ready for discharge home by November 22, 2006.   DISCHARGE MEDICATIONS:  1. Aspirin 325 mg daily.  2. Toprol XL 25 mg daily.  3. Altace 2.5 mg b.i.d.  4. Lipitor 20 mg daily.  5. Zetia 10 mg daily.  6. Allopurinol 300 mg daily.  7. Actos 30 mg daily.  8. Tylox one to two q.4 h p.r.n. for pain.  9. Nu-Iron 150 mg daily.   DISCHARGE INSTRUCTIONS:  He is asked to refrain from driving, heavy  lifting or strenuous activity.  He may continue ambulating daily and  using his incentive spirometer.  He may shower daily and clean his  incisions with soap and water.  He will continue his same preoperative  diet.   DISCHARGE FOLLOWUP:  He will see Dr. Corinda Gubler back in the office in 2  weeks and will have a chest x-ray at that visit.  He will then see Dr.  Tyrone Sage the following week. Our office will contact him with an  appointment.  In the interim if he experiences problems or has  questions, he is asked to contact our office.      Coral Ceo, P.A.      Sheliah Plane, MD  Electronically Signed    GC/MEDQ  D:  11/21/2006  T:  11/21/2006  Job:  [id number]   cc:   [name], M.D.  Cecil Cranker, MD, [facility]

## 2011-05-01 NOTE — Op Note (Signed)
NAMECORDALE, MANERA NO.:  [id number]   MEDICAL RECORD NO.:  [id number]          PATIENT TYPE:  INP   LOCATION:  2031                         FACILITY:  [facility]   PHYSICIAN:  Sheliah Plane, MD    DATE OF BIRTH:  [birth year]   DATE OF PROCEDURE:  11/16/2006  DATE OF DISCHARGE:  11/22/2006                               OPERATIVE REPORT   PREOPERATIVE DIAGNOSIS:  Recurrent coronary occlusive disease and  critical aortic stenosis.   POSTOPERATIVE DIAGNOSIS:  Recurrent coronary occlusive disease and  critical aortic stenosis.   OPERATION/PROCEDURE:  1. Coronary artery bypass grafting x1 with right thigh endovein      harvesting and reverse saphenous vein graft to the posterior      descending coronary artery.  2. Aortic valve replacement with a 21 mm [employer]      pericardial tissue valve, model 300, serial number [id number].   SURGEON:  Gwenith Daily. Tyrone Sage, M.D.   FIRST ASSISTANT:  Pecola Leisure, PA-C.   BRIEF HISTORY:  The patient is a 77 year old male who had previously  undergone coronary artery bypass grafting x6 in March of 1988 by Dr.  Andrey Campanile.  At that time graft from the left internal mammary was placed to  the LAD, vein graft to the diagonal, intermediate and OM and a vein  graft to the posterior descending artery and to the continuation branch.  The patient had previously had angioplasties performed on the right  coronary system.  He had done well over the years but recently had been  developing increasing symptoms of heart failure with exertion and  underwent redo cardiac catheterization which demonstrated critical  aortic stenosis.  The patient's mammary and vein grafts to the left  system were patent.  The vein graft to the PD was occluded.  With  critical aortic stenosis, the aortic valve replacement was recommended  to the patient who agreed and signed informed consent.   DESCRIPTION OF PROCEDURE:  With Swan-Ganz catheter and arterial  line  monitors in place, the patient underwent general endotracheal anesthesia  without incident.  The skin of the chest was prepped with Betadine and  draped in the usual sterile manner.  Using the [employer] endovein  harvesting system, vein was harvested from the right thigh and was of  good quality and caliber.   Using the sagittal saw, median sternotomy was performed, taking care not  to injure any underlying structures.  With some difficulty, the  ascending aorta and the right atrium were dissected out.  Enough of the  anterior and lateral aspect of the heart were dissected out to identify  the mammary artery and encircle it with a blue vessel loop.  The patient  was systemically heparinized.  The ascending aorta was cannulated.  The  right atrium was cannulated.  Retrograde cardioplegia catheter was  pushed, placed through a separate stab wound in the right atrium into  the coronary sinus.  The patient was placed on cardiopulmonary bypass,  2.4 L/per minute/sq m.  The posterior descending coronary was identified  and was large enough to  consider for bypass.  The right superior  pulmonary vein __________ was placed.  The patient's body temperature  was cooled to 30 degrees.  Aortic crossclamp was applied and 500 mL of  cold blood potassium cardioplegia was administered with diastolic arrest  of the heart.   Attention was turned first to the posterior descending coronary artery  which was opened and admitted a 0.5 mm probe.  Using a running 7-0  Prolene, distal anastomosis was performed with a __________ saphenous  vein graft.  Cold blood cardioplegia was administered down the vein  graft intermittently.  Retrograde cardioplegia was also administered.   Attention was then turned to the aorta where a transverse aortotomy was  performed, revealing a highly calcified tricuspid aortic valve.  The  valve was excised and the annulus debrided of loose calcific debris.  The annulus was  sized for 21 mm pericardial magna valve.  Number 2  Dacron pledgets on the ventricular surface were placed circumferentially  around the annulus.  A valve was selected and secured in place.  Care  was taken to remove all loose calcific debris and aortotomy was enclosed  with a horizontal mattress 4-0 Prolene suture over felt strips.  A  single punch aortotomy was performed and the vein graft to the posterior  descending was anastomosed to the ascending aorta.  Prior to complete  closure of the graft on the ascending aorta, heart was allowed to  passively fill and de-air.  The closure was completed.  Aortic  crossclamp was removed.  Total crossclamp time was 107 minutes.  The  patient required a deep sequential pacing but ultimately returned to a  sinus rhythm.  A 16-gauge needle was introduced into the left  ventricular apex to further de-air the heart.  With the patient's body  temperature rewarmed to 37 degrees, he was then ventilated and weaned  from the cardiopulmonary bypass without difficulty.  He remained  hemodynamically stable.  He was decannulated in the usual fashion.  Protamine sulfate was administered with the operative field hemostatic.  Two atrial and two ventricular pacing wires were applied.  Graft makers  applied.  Left pleural tube and two mediastinal tubes were left in  place.  Sternum was closed with #6 stainless steel wire, fascia closed  with interrupted 0 Vicryl, running 3-0 Vicryl and subcutaneous tissue  with subcuticular stitch was used.  Dry dressings were applied.  Sponge  and needle count was reported as correct.  The patient tolerated the  procedure without obvious complications and was transferred to the  surgical intensive care unit for further postoperative care.      Sheliah Plane, MD  Electronically Signed     EG/MEDQ  D:  12/23/2006  T:  12/23/2006  Job:  [id number]   cc:   Cecil Cranker, MD, [facility]

## 2011-05-01 NOTE — Assessment & Plan Note (Signed)
[facility]                              CARDIOLOGY OFFICE NOTE   [name], Nathaniel                         MRN:          [id number]  DATE:10/01/2006                            DOB:          1932-10-17    PRIMARY CARDIOLOGIST:  Cecil Cranker, MD, [facility]   REASON FOR VISIT:  Progressive dyspnea, exertion, and chest pain.   HISTORY OF PRESENT ILLNESS:  Nathaniel [name] is a pleasant 77 year old gentleman  followed by Dr. Corinda Gubler with multivessel coronary artery disease, status  post previous bypass surgery in 1998 by Dr. Andrey Campanile.  At that time, he had a  LIMA to the left anterior descending, saphenous vein graft to the diagonal,  intermediate, and first obtuse marginal as well as a saphenous vein graft to  the posterior descending branch.  He has known occlusion of the vein graft,  diagonal branch, as well as occlusion of the sequential portions of the vein  graft to the obtuse marginal and diagonal branch.  Other grafts were found  to be patent.  Also noted was diffuse disease in the distal left anterior  descending.  He has been managed medically and had a follow-up myocardial  perfusion study in July, 2005.  This study revealed evidence of old inferior  scar without significant ischemia, and ejection fraction was calculated at  43% at that point.  His most recent echocardiogram in September, 2006  indicated moderate aortic stenosis with a peak transvalvular velocity of 3.3  meters per second and a mean gradient of 44 mmHg.   Symptomatically, Nathaniel [name] states that he has been fairly stable until  approximately three months ago.  He has been experiencing progressive  dyspnea on exertion as well as exertional chest discomfort.  To some degree,  he has attributed this to possible seasonal allergies and the dry weather as  well as a recent mattress that he has obtained.  More concerning, however,  is a description of fairly dyspnea on exertion and  exertional chest pain  that was exhausting while he was playing a golf match this past weekend.  He also played golf on this past Wednesday but was only able to play 12  holes before having to quit.  He is not having any resting symptoms but  otherwise reports compliance with his medications.   His electrocardiogram today shows sinus rhythm with evidence of left  ventricular hypertrophy and probable repolarization abnormalities.  He does  have more pronounced ST/T wave changes in the inferolateral leads in  comparison to his prior tracing from April of last year.  Today we discussed  the situation in detail, and I recommended repeat evaluation of both his  aortic valve and his coronary anatomy.  After reviewing the risks and  benefits, we arrived at a diagnostic cardiac catheterization with attention  to both issues.   ALLERGIES:  No known drug allergies.   CURRENT MEDICATIONS:  1. Altace 10 mg p.o.  2. Actos 30 mg p.o. daily.  3. Zetia 10 mg p.o. daily.  4. Allopurinol 300 mg p.o. daily.  5.  Cardura 2 mg p.o. q.a.m. and 1 mg p.o. q.p.m.  6. Diovan 80 mg p.o. daily.  7. Lipitor 20 mg p.o. daily.  8. Aspirin 81 mg p.o. daily.  9. Vitamin C supplements.  10.Zyrtec p.r.n.   REVIEW OF SYSTEMS:  As described in the history of present illness,  otherwise negative.   Past medical history, social history, and family history are all reviewed  previously.  He does have a known abdominal aortic aneurysm measured by  ultrasound in August, 2005 at 3.4 x 3.2 cm.  He also has a history of  diabetes.  He quit smoking many years ago and has otherwise tried to stay  fairly active.   PHYSICAL EXAMINATION:  VITAL SIGNS: Blood pressure 115/70, heart rate 70.  Weight is 205 pounds.  GENERAL:  Patient is comfortable and in no acute distress, denying any  active chest pain or dyspnea at rest.  HEENT:  Conjunctivae, lids normal. Pharynx is clear.  NECK:  Supple without elevated jugular venous  pressure or loud carotid  bruits.  No thyromegaly is noted.  There is a radiation of a cardiac murmur  up to the right carotid.  LUNGS:  Clear without labored breathing.  CARDIAC:  Regular rate and rhythm with a 3/6 harsh systolic murmur heard  best at the right base radiating towards the carotid and also towards the  apex with __________ effect.  There is no S3 gallop appreciate.  There is no  rub.  ABDOMEN:  Soft.  No loud abdominal bruit or obvious pulsatile mass.  Bowel  sounds are present.  EXTREMITIES:  No significant pitting edema.  Distal pulses are 2+.  SKIN:  No ulcerative changes noted.  MUSCULOSKELETAL:  No kyphosis noted.  NEURO/PSYCHIATRIC:  Patient is alert and oriented x3.   IMPRESSION/RECOMMENDATIONS:  1. Progressive dyspnea on exertion as well as exertional chest pain in the      setting of known multivessel coronary artery disease with previous      coronary artery bypass grafting and known graft disease as well as      moderate aortic stenosis documented by echocardiography last year.  We      discussed the risks and benefits of proceeding with diagnostic cardiac      catheterization with attention to both issues, and he is in agreement      to proceed.  I have asked him in the meanwhile to avoid significant      exertion and continue his present regimen.  If he develops any resting      symptoms, I have asked him to seek medical attention, otherwise we will      plan diagnostic evaluation early next week.  Further plans to follow      from there.  2. History of type 2 diabetes mellitus:  On Actos.  3. Hypertension:  Well controlled today.  4. Hyperlipidemia:  On Lipitor.       Jonelle Sidle, MD     SGM/MedQ  DD:  10/01/2006  DT:  10/01/2006  Job #:  [id number]   cc:   Cecil Cranker, MD, [facility]

## 2011-05-01 NOTE — Assessment & Plan Note (Signed)
[name]                              CARDIOLOGY OFFICE NOTE   DERAK, SCHURMAN                         MRN:          [id number]  DATE:10/18/2006                            DOB:          Apr 23, 1932    REFERRING PHYSICIAN:  Cecil Cranker, MD, [facility]   Nathaniel [name] is a pleasant 77 year old white male, 19 years post CABG by Dr.  Particia Lather, now with moderate to severe aortic stenosis with recent  onset of dyspnea on exertion, which is actually improved since I saw him a  week ago.  We gave him Lasix 20 mg a day at that time, but he did not have  significant diuresis.  He has lost only 1 pound.  He attributes his  improvement to the fact that he now has non-allergenic colors and he is  getting a cover for his mattress.  However, he did have pulmonary function  studies which revealed mild obstructive defect responding to a  bronchodilator.   MEDICATIONS:  1. Altace 10.  2. Actos 30.  3. Zetia 10.  4. Allopurinol 300.  5. Cardura 2.  6. Diovan 80.  7. Lipitor 20.  8. Aspirin 81.  9. Vitamins.  10.Lasix every other day.   PHYSICAL EXAMINATION:  VITAL SIGNS: Blood. Pressure 101/57.  Pulse 70.  Normal sinus rhythm.  NECK:  JVP is not elevated.  Carotid pulses decreased bilaterally.  There  are bruits bilaterally.  LUNGS:  Clear.  CARDIAC EXAM:  A 2-3/6 systolic ejection murmur.  EXTREMITIES:  Normal.   I have reviewed the patient with Dr. Augustina Mood and we will plan CPX to help  in further making decision concerning whether his symptoms are related to  his pulmonary status or his aortic stenosis.   I will see him back in 4 weeks or p.r.n.   Should also note a recent carotid reveals 60-79% right internal carotid  stenosis and 40-59% left, essentially unchanged from August 19, 2005.  We  will plan to discontinue his Lasix at this time unless he notes any weight  gain or increased shortness of breath.    ______________________________  E. Graceann Congress, MD, [facility]    EJL/MedQ  DD: 10/19/2006  DT: 10/20/2006  Job #: [id number]   cc:   [name], M.D.

## 2011-05-01 NOTE — Cardiovascular Report (Signed)
NAMEMATTI, MINNEY                  ACCOUNT NO.:  [id number]   MEDICAL RECORD NO.:  [id number]          PATIENT TYPE:  OIB   LOCATION:  1965                         FACILITY:  [facility]   PHYSICIAN:  Peter C. Eden Emms, MD, FACCDATE OF BIRTH:  06/18/1932   DATE OF PROCEDURE:  DATE OF DISCHARGE:  10/05/2006                              CARDIAC CATHETERIZATION   Mr. Lumpkin is a 74-year patient of Dr. Glennon Hamilton.  He is status post  previous coronary bypass graft surgery.  He has a known occlusion of the  vein graft to the right and sequential portions of the intermediate diagonal  graft.  He has been having increasing exertional dyspnea.  He has also  history of moderate aortic stenosis by echo in 2006.  Right and left heart  cath was done to assess graft patency, symptoms of dyspnea, progression of  aortic stenosis.   CORONARY ARTERIOGRAPHY:  The patient had known occlusion of the left main  coronary artery.  It was not re-injected.  The right coronary artery was  100% occluded proximally.  There was some faint bridging collaterals to  mostly an RV branch.   The saphenous vein graft to the right coronary artery was 100% occluded  after about 1 cm.   The saphenous vein graft to the intermediate diagonal OM was widely patent.  The intermediate branch filled normally.  There were some bridging  collaterals to the distal circumflex vessels which were unchanged from  previous.   We had some difficulty getting up into the left internal mammary artery.  We  finally succeeded with a long exchange wire and upgrading to a 7-French  system.   The left internal mammary artery is widely patent to the LAD.  The distal  LAD had 50 to 60% diffuse disease.  There were fairly good collaterals to  the right coronary artery from the left internal mammary.   The RAO ventriculography showed minor inferior basal wall hypokinesis.  There was somewhat sub-optimal filling of the distal ventricle, but the  overall ejection fraction was normal in the 55% range.   Fluoroscopically, the aortic valve was significantly calcified.   Right heart catheterization showed mean right atrial pressure 5, RV pressure  59/8, PA pressure 56/18, pulmonary capillary wedge pressure 18.  There was a  large V-wave.  LV pressure was 174/10 with post A-wave EDP of 30.  Aortic  pressure was only 114/56.  Cardiac output was 4.4 liters per minute.   The aortic valve area by HAKKI equation was 0.6 cm2.   We did have some difficulty negotiating the right iliac system.  He had a  known aneurysm the distal aorta.  We therefore shot a single AP distal  aortography.  The patient had a moderate-sized saccular aortic aneurysm that  was infrarenal.   At the end of the case, we were able to Angio-Seal the right femoral artery  using a 7-French device, and the venous sheath was pulled manually.   IMPRESSION:  The patient's symptoms are primarily increasing dyspnea.  He  needs a follow-up echocardiogram from last year.  I suspect his aortic  stenosis velocity will now be over 4 meters per second, and his mean  gradient will be over 40.   Certainly by invasive catheterization, he has severe aortic stenosis.   I suspect that this is the cause of his increasing dyspnea.  However, his PA  systolic pressures were fairly high.  He does have seasonal allergies.  I  think it would be important to check PFTs and possibly a chest CT to further  assess his lung function.   I will make him a follow-up appointment to see Dr. Gabriel Rung and to have a 2-D  echocardiogram.   Repeat surgery in this patient would be somewhat high risk, given his  moderately elevated PA pressure and re-do surgery with a patent LIMA.   However, I think his echo will show progression of disease, and he will  warrant re-do operation.           ______________________________  Noralyn Pick Eden Emms, MD, [facility]     PCN/MEDQ  D:  10/05/2006  T:  10/06/2006  Job:   [id number]   cc:   Cecil Cranker, MD, [facility]

## 2011-05-01 NOTE — Letter (Signed)
December 01, 2006    [name], M.D.  [address], [address]  [place], [place] [address]   RE:  Nathaniel [name], Nathaniel [name]  MRN:  [id number]  /  DOB:  Apr 22, 1932   Dear Casimiro Needle,   It was a pleasure seeing the nice patient, Nathaniel [name] on December 01, 2006, for followup 15 days postoperative aortic valve replacement with a  21-mm Magna pericardial tissue valve and redo SVG to posterior  descending coronary artery by Dr. Tyrone Sage.  The patient, as you know,  is 19 years post initial CABG by Dr. Particia Lather.  The patient also  has type 2 diabetes and gout and had some postoperative blood loss  anemia.  Additionally, he has a history of abdominal aortic aneurysm.   On the 12th, he presented here with shortness of breath, atrial  fibrillation, rapid ventricular rate of about 24 to 36 hours' duration.  He was admitted, started on IV Cardizem, and promptly converted to a  normal sinus rhythm.  Because he converted so promptly, we did not add  Coumadin.  He was discharged on the 19th on:  1. Aspirin 325.  2. Toprol XL __________.  3. Actos 30.  4. Zetia.  5. Allopurinol 300 daily.  6. Zocor 40.  7. Altace 2.5 daily.  8. Iron.  9. Lasix 40.  10.Coumadin  11.__________.  12.Lanoxin 0.25 daily.  13.Oxycodone.   The patient lost approximately 16 pounds with diuresis in the hospital.  He was actually fluid overloaded on admission.  BNP on discharge was  561.   The patient states he has felt well since discharge 5 days, however, he  was somewhat fatigued on Sunday.  He had discontinued his Toprol and has  felt better since that time.  A chest x-ray done last week revealed a  questionable shadow on the right.  It was suggested this be repeated.   PHYSICAL EXAM:  Blood pressure 129/66, pulse 75, normal sinus rhythm.  GENERAL APPEARANCE:  Normal.  JVP is not elevated.  LUNGS:  Clear.  CARDIAC:  A 2/6 short to moderate length systolic ejection murmur at  aortic area.  No diastolic  murmur.  ABDOMEN:  Unremarkable.  EXTREMITIES:  2+ edema on the right, 1+ on the left.   IMPRESSION:  1. Fifteen days post aortic valve replacement for severe aortic      stenosis and saphenous vein graft to the right coronary artery with      previous coronary artery bypass graft 19 years previously.  2. Postoperative atrial fibrillation and volume overload with      readmission on the 12th.  Atrial fibrillation responding to      intravenous Cardizem and the volume overload to Lasix.   DISCHARGE DIAGNOSES:  1. Abdominal aortic aneurysm.  2. Type 2 diabetes.  3. Gout.  4. Postoperative blood loss anemia.   The EKG today reveals a normal sinus rhythm with mild interventricular  conduction delay and ST-T abnormality.   We are going to continue same therapy.  He is adamant that he cannot  take the beta blocker.  We will plan to repeat the chest x-ray.  I will  see him back in approximately 2 weeks.   ADDENDUM:  He also has had some dysuria and we will plan to have him see  Dr. Leslie Dales concerning this.    Sincerely,      E. Graceann Congress, MD, [facility]  Electronically Signed    EJL/MedQ  DD: 12/01/2006  DT: 12/01/2006  Job #: [id number]   CC:    Sheliah Plane, MD

## 2011-05-01 NOTE — Assessment & Plan Note (Signed)
[facility]                            CARDIOLOGY OFFICE NOTE   Nathaniel, [name]                           MRN:          [id number]  DATE:03/15/2007                            DOB:          01-15-1932    I have reviewed his telemetry and this reveals several episodes of what  appear to be atrial fibrillation. I have reviewed this with Dr. Ladona Ridgel  and we both feel very strongly the patient should remain on Coumadin he  is not happy with this but I think will go along. I have suggested maybe  trying some Toprol but he say he can not take Toprol. I have also  suggested he see me in follow up in 4 to 6 weeks.     Cecil Cranker, MD, [facility]  Electronically Signed    EJL/MedQ  DD: 03/15/2007  DT: 03/15/2007  Job #: [id number]

## 2011-05-01 NOTE — H&P (Signed)
[facility] ADMISSION   Nathaniel [name], Nathaniel [name]                         MRN:          [id number]  DATE:11/24/2006                            DOB:          [birth year]    [name] is a very pleasant 77 year old white male, 19 years post CABG  by Delsa Grana. Andrey Campanile, M.D. and 9 days post aortic valve replacement with  a 21 mm Magna pericardial tissue valve and redo SVG to posterior  descending coronary artery by Sheliah Plane, M.D.  The patient has  history of type 2 diabetes, gout, and had some postoperatively blood  loss anemia, history of abdominal aortic aneurysm.  The patient was  discharged on the sixth hospital day in sinus rhythm.  He felt well for  the next 24 hours, however, yesterday began to note shortness of breath,  severe episodes of coughing, and last evening was unable to sleep except  sitting up in a chair.  He called this morning and we had him come over  to the office for evaluation.  He has also noted significant peripheral  edema.   DISCHARGE MEDICATIONS:  1. Aspirin 325.  2. Toprol XL 25.  3. Altace 2.5 b.i.d.  4. Lipitor 20.  5. Zetia 10.  6. Allopurinol 300.  7. Actos 30.  8. Tylox q.4 hours p.r.n.  9. Nu-Iron 150 mg daily.   REVIEW OF SYSTEMS:  HEENT:  Unremarkable.  CARDIORESPIRATORY:  As noted  above, he has had significant episodes of coughing.  ABDOMEN:  He has  been eating poorly and had some black stools, but no diarrhea, nausea  and vomiting.  EXTREMITIES:  2-3+ edema to above the knees.   FAMILY HISTORY:   SOCIAL HISTORY:  As on recent admission note.   PHYSICAL EXAMINATION:  VITAL SIGNS:  Blood pressure 98/62, pulse 137,  atrial fibrillation.  GENERAL:  Pale, anxious, and in no severe distress.  HEENT:  Unremarkable.  JVP's are elevated in the sitting position.  Carotid pulses are palpated without bruit.  LUNGS:  Clear.  HEART:  Rapid rate, irregular, with no significant  murmur.  ABDOMEN:  Unremarkable except for some obesity.  EXTREMITIES:  2-3+ edema to above the knees.  NEUROLOGY:  Unremarkable.   IMPRESSION:  1. Atrial fibrillation with rapid ventricular response probably 24 to      no more than 36 hours in duration.  2. 8 days postoperative aortic valve replacement and redo coronary      artery bypass graft.  3. Significant peripheral edema, volume overload.  4. Diabetes mellitus.  5. History of gout.  6. History of abdominal aortic aneurysm.  7. Rule out gastrointestinal blood loss.  8. Abdominal aortic aneurysm measuring 3.5 x 3.3.  9. Bilateral carotid stenosis.   PLAN:  We plan to admit the patient to the hospital.  We will check his  stool and if negative, start him on heparin and then Coumadin tonight.  We will also slow his rate with IV Cardizem and plan TEE DC  cardioversion in the a.m. if not  converted.  I have reviewed this with  Nathaniel [name]. Nathaniel [name], M.D. who will see him tomorrow.  I should note the EKG  reveals atrial fibrillation with rapid ventricular response, rate of  137.  Chest x-ray reveals mild cardiomegaly with minimal blunting of the  right costophrenic angle, no evidence of pulmonary congestion.     Cecil Cranker, MD, [facility]  Electronically Signed    EJL/MedQ  DD: 11/24/2006  DT: 11/24/2006  Job #: [id number]

## 2011-06-08 NOTE — Telephone Encounter (Signed)
Summary: pt rtn call   Phone Note Call from Patient Call back at Work Phone [phone]   Caller: Patient Reason for Call: Talk to Nurse, Talk to Doctor Summary of Call: pt rtn call from yesterday to get results of test and he may have to go out for a little bit this morning so please call back Initial call taken by: Omer Jack,  January 28, 2011 8:51 AM  Follow-up for Phone Call        Pt aware of carotid and aorta doppler results.  Follow-up by: Lisabeth Devoid RN,  January 28, 2011 9:06 AM

## 2011-06-08 NOTE — Progress Notes (Signed)
Summary: [facility]: Office Visit  [facility]: Office Visit   Imported By: Earl Many 01/28/2011 18:17:05  _____________________________________________________________________  External Attachment:    Type:   Image     Comment:   External Document

## 2011-07-10 ENCOUNTER — Other Ambulatory Visit: Payer: Self-pay | Admitting: Cardiology

## 2011-07-24 ENCOUNTER — Other Ambulatory Visit: Payer: Self-pay | Admitting: Cardiology

## 2011-07-24 DIAGNOSIS — I6529 Occlusion and stenosis of unspecified carotid artery: Secondary | ICD-10-CM

## 2011-07-27 ENCOUNTER — Encounter (INDEPENDENT_AMBULATORY_CARE_PROVIDER_SITE_OTHER): Payer: Medicare Other | Admitting: *Deleted

## 2011-07-27 DIAGNOSIS — I6529 Occlusion and stenosis of unspecified carotid artery: Secondary | ICD-10-CM

## 2011-07-30 ENCOUNTER — Encounter: Payer: Self-pay | Admitting: Cardiology

## 2011-08-27 ENCOUNTER — Ambulatory Visit (INDEPENDENT_AMBULATORY_CARE_PROVIDER_SITE_OTHER): Payer: Medicare Other | Admitting: *Deleted

## 2011-08-27 DIAGNOSIS — I739 Peripheral vascular disease, unspecified: Secondary | ICD-10-CM

## 2011-09-14 ENCOUNTER — Telehealth: Payer: Self-pay

## 2011-09-14 NOTE — Telephone Encounter (Signed)
Nathaniel [name] called on 9/25 to request the results of ABI's performed on 08/27/11.  I called him to talk with him about the results that day.  I told the patient that a copy of the results were sent to Dr. Leslie Dales.  He was not evaluated by a provider the day of the testing. He wanted to know what Dr. Candie Chroman recommendations were regarding the results.  I told him that I would speak with Dr. Hart Rochester and call him back.  I spoke with Dr. Hart Rochester on 09/09/11 and he recommended that the patient come in for evaluation and lower extremity arterial duplex. I spoke with Nathaniel [name] again on 09/11/11.  After talking with him he decided to make an appointment to see Dr. Hart Rochester.  The information was given to a scheduler and he was called to schedule the appointments (10/06/11).  Dr. Hart Rochester also questioned whether or not the patient was getting a carotid duplex performed routinely and he said that he was.

## 2011-09-21 LAB — PROTIME-INR: Prothrombin Time: 13.1

## 2011-09-22 LAB — CBC
HCT: 36.5 — ABNORMAL LOW
Hemoglobin: 12.4 — ABNORMAL LOW
Hemoglobin: 13.9
MCHC: 33.9
MCHC: 34
MCV: 91.2
MCV: 91.3
Platelets: 255
RBC: 4.51
RDW: 14.5

## 2011-09-22 LAB — COMPREHENSIVE METABOLIC PANEL
Albumin: 3.2 — ABNORMAL LOW
Alkaline Phosphatase: 51
BUN: 19
Creatinine, Ser: 1.09
Glucose, Bld: 194 — ABNORMAL HIGH
Potassium: 4.2
Total Protein: 6.1

## 2011-09-22 LAB — DIFFERENTIAL
Basophils Relative: 1
Eosinophils Absolute: 0.1 — ABNORMAL LOW
Monocytes Absolute: 0.6
Monocytes Relative: 11
Neutrophils Relative %: 70

## 2011-09-22 LAB — I-STAT 8, (EC8 V) (CONVERTED LAB)
BUN: 20
Bicarbonate: 25.8 — ABNORMAL HIGH
Glucose, Bld: 134 — ABNORMAL HIGH
HCT: 44
Hemoglobin: 15
Operator id: 192351
Potassium: 4
Sodium: 135
TCO2: 27

## 2011-09-22 LAB — POCT I-STAT CREATININE: Creatinine, Ser: 1

## 2011-09-22 LAB — PREPARE FRESH FROZEN PLASMA

## 2011-09-22 LAB — PROTIME-INR
INR: 1
Prothrombin Time: 13.4

## 2011-09-22 LAB — CROSSMATCH

## 2011-09-22 LAB — POCT CARDIAC MARKERS
Operator id: 192351
Troponin i, poc: <0.05

## 2011-09-22 LAB — APTT
aPTT: 30
aPTT: 35

## 2011-10-02 ENCOUNTER — Other Ambulatory Visit: Payer: Self-pay

## 2011-10-02 DIAGNOSIS — I70219 Atherosclerosis of native arteries of extremities with intermittent claudication, unspecified extremity: Secondary | ICD-10-CM

## 2011-10-05 ENCOUNTER — Encounter: Payer: Self-pay | Admitting: Vascular Surgery

## 2011-10-06 ENCOUNTER — Ambulatory Visit (INDEPENDENT_AMBULATORY_CARE_PROVIDER_SITE_OTHER): Payer: Medicare Other | Admitting: Vascular Surgery

## 2011-10-06 ENCOUNTER — Encounter: Payer: Self-pay | Admitting: Vascular Surgery

## 2011-10-06 VITALS — BP 128/58 | HR 70 | Resp 20 | Ht 71.0 in | Wt 190.0 lb

## 2011-10-06 DIAGNOSIS — I70219 Atherosclerosis of native arteries of extremities with intermittent claudication, unspecified extremity: Secondary | ICD-10-CM

## 2011-10-06 DIAGNOSIS — I6529 Occlusion and stenosis of unspecified carotid artery: Secondary | ICD-10-CM

## 2011-10-06 DIAGNOSIS — I714 Abdominal aortic aneurysm, without rupture: Secondary | ICD-10-CM

## 2011-10-06 NOTE — Procedures (Unsigned)
LOWER EXTREMITY ARTERIAL DUPLEX  INDICATION:  Peripheral vascular disease.  HISTORY: Diabetes:  Yes. Cardiac:  CAD. Hypertension:  Yes. Smoking:  No. Previous Surgery:  No lower extremity artery intervention.  SINGLE LEVEL ARTERIAL EXAM                         RIGHT                LEFT Brachial: Anterior tibial: Posterior tibial: Peroneal: Ankle/Brachial Index:  LOWER EXTREMITY ARTERIAL DUPLEX EXAM  DUPLEX: 1. Bilateral superficial femoral artery stenosis of greater than 75%     in the mid distal thigh segment with peak systolic velocities of     over 400 cm/s. 2. Diffuse calcified plaque noted throughout bilateral lower extremity     arterial systems.  IMPRESSION: 1. Native artery stenosis present, as noted above. 2. Diffuse plaque bilaterally, as noted above. 3. Ankle brachial indices from 08/27/2011 in the mild-to-moderate     claudication range.   ____________________________________ Quita Skye. Hart Rochester, M.D.  SH/MEDQ  D:  10/06/2011  T:  10/06/2011  Job:  [id number]

## 2011-10-06 NOTE — Progress Notes (Signed)
BIlateral LEAD performed [facility] 10/06/2011

## 2011-10-06 NOTE — Progress Notes (Signed)
Subjective:     Patient ID: Nathaniel [name]., male   DOB: 05/30/1932, 77 y.o.   MRN: [id number]  HPI this 77 year old male patient is evaluated today for lower extremity claudication symptoms. He states both calves become heavy and tired after walking 3-4 blocks but he is able to continue walking. Denies any history of infection gangrene or rest pain. He plays a lot of golf and does walk although he uses a cart. He denies any neurologic symptoms such as lateralizing weakness, diplopia, blurred vision, syncope, or amaurosis fugax. He has a known abdominal aortic aneurysm which is followed by Dr. Elijah Birk wall.  Past Medical History  Diagnosis Date  . CAD (coronary artery disease)   . Atrial fibrillation   . Diabetes mellitus   . Hyperlipemia   . Hx: UTI (urinary tract infection)   . Gout   . Hypertension   . Cataract   . GERD (gastroesophageal reflux disease)     History  Substance Use Topics  . Smoking status: Former Smoker -- 20 years    Types: Cigarettes  . Smokeless tobacco: Never Used  . Alcohol Use: Yes     1-2 drinks daily     Family History  Problem Relation Age of Onset  . Colon cancer Neg Hx   . Heart disease Father     No Known Allergies  Current outpatient prescriptions:ACTOS 30 MG tablet, , Disp: , Rfl: ;  allopurinol (ZYLOPRIM) 100 MG tablet, Take 100 mg by mouth daily.  , Disp: , Rfl: ;  aspirin 81 MG tablet, Take 81 mg by mouth daily.  , Disp: , Rfl: ;  atorvastatin (LIPITOR) 40 MG tablet, , Disp: , Rfl: ;  doxazosin (CARDURA) 1 MG tablet, 1 mg. 3 daily, Disp: , Rfl: ;  LANOXIN 0.25 MG tablet, TAKE ONE TABLET EVERY DAY, Disp: 30 tablet, Rfl: 10 LOVAZA 1 G capsule, 3 daily, Disp: , Rfl: ;  NIASPAN 500 MG CR tablet, , Disp: , Rfl: ;  ramipril (ALTACE) 5 MG capsule, TAKE 1 CAPSULE DAILY, Disp: 90 capsule, Rfl: 2;  ZETIA 10 MG tablet, , Disp: , Rfl:  Current facility-administered medications:0.9 %  sodium chloride infusion, 500 mL, Intravenous, Continuous, Yancey Flemings,  MD  BP 128/58  Pulse 70  Resp 20  Ht 5\' 11"  (1.803 m)  Wt 190 lb (86.183 kg)  BMI 26.50 kg/m2  SpO2 98%  Body mass index is 26.50 kg/(m^2).        Review of Systems he denies chest pain, dyspnea on exertion, orthopnea, PND, palpitations, or reflux esophagitis. He does complain of urinary frequency, and leg discomfort with walking. All other systems are negative the complete review of systems     Objective:   Physical Exam blood pressure 120/58 heart rate 70 respirations 20 General he is alert and oriented x3 well-developed and well-nourished HEENT normal for age Lungs clear no rhonchi or wheezing Cardiovascular regular rhythm no murmurs Carotid pulses 3+ soft bruit on the right Neurologic exam normal Abdomen soft nontender with a small pulsatile mass approximately 4 cm Lower extremity exam reveals 3+ femoral pulses bilaterally no popliteal pulses palpable 1+ DP on the right 2+ PT on the left. Both feet are well-perfused with no infection cellulitis or gangrene.  Today I ordered lower extremity arterial Dopplers and duplex scan of his lower extremity vessels. ABI on the right is 0.7 and on the left is 0.75. It appears that he has moderate narrowing in the superficial femoral arteries bilaterally but  they are patent.  Carotid duplex exam performed in Dr. Vern Claude office on 07/27/2011 revealed a 75-80% right internal carotid stenosis an approximate 60% left internal carotid stenosis he also is having his aortic aneurysm followed by Dr. wall but I do not have a report of any recent examinations    Assessment:     Bilateral lower extremity claudication secondary to stenoses and superficial femoral arteries bilaterally-symptoms well-tolerated at this time Stable carotid occlusive disease approaching need for treatment on the right side currently approximately 75-80% and asymptomatic. Left side 50-60% stenotic ICA Abdominal aortic aneurysm followed by Dr. Elijah Birk wall-I. do not know  current dimensions    Plan:     If the claudication symptoms worsen or if right carotid stenosis worsens patient will be in touch with me for further followup. These entities are all followed by Dr. wall in his office.

## 2011-12-17 ENCOUNTER — Telehealth: Payer: Self-pay | Admitting: Cardiology

## 2011-12-17 DIAGNOSIS — H35359 Cystoid macular degeneration, unspecified eye: Secondary | ICD-10-CM | POA: Diagnosis not present

## 2011-12-17 DIAGNOSIS — E119 Type 2 diabetes mellitus without complications: Secondary | ICD-10-CM | POA: Diagnosis not present

## 2011-12-17 DIAGNOSIS — H35379 Puckering of macula, unspecified eye: Secondary | ICD-10-CM | POA: Diagnosis not present

## 2011-12-17 NOTE — Telephone Encounter (Signed)
12,LOV,Doppler faxed to [facility] @  [phone]  12/17/11/KM

## 2011-12-23 DIAGNOSIS — H35379 Puckering of macula, unspecified eye: Secondary | ICD-10-CM | POA: Diagnosis not present

## 2011-12-23 DIAGNOSIS — H5460 Unqualified visual loss, one eye, unspecified: Secondary | ICD-10-CM | POA: Diagnosis not present

## 2011-12-31 DIAGNOSIS — H35379 Puckering of macula, unspecified eye: Secondary | ICD-10-CM | POA: Diagnosis not present

## 2012-01-05 DIAGNOSIS — M109 Gout, unspecified: Secondary | ICD-10-CM | POA: Diagnosis not present

## 2012-01-05 DIAGNOSIS — E119 Type 2 diabetes mellitus without complications: Secondary | ICD-10-CM | POA: Diagnosis not present

## 2012-01-05 DIAGNOSIS — E782 Mixed hyperlipidemia: Secondary | ICD-10-CM | POA: Diagnosis not present

## 2012-01-07 DIAGNOSIS — E119 Type 2 diabetes mellitus without complications: Secondary | ICD-10-CM | POA: Diagnosis not present

## 2012-01-07 DIAGNOSIS — E782 Mixed hyperlipidemia: Secondary | ICD-10-CM | POA: Diagnosis not present

## 2012-01-07 DIAGNOSIS — I10 Essential (primary) hypertension: Secondary | ICD-10-CM | POA: Diagnosis not present

## 2012-01-07 DIAGNOSIS — M109 Gout, unspecified: Secondary | ICD-10-CM | POA: Diagnosis not present

## 2012-01-25 DIAGNOSIS — H35379 Puckering of macula, unspecified eye: Secondary | ICD-10-CM | POA: Diagnosis not present

## 2012-02-29 DIAGNOSIS — E119 Type 2 diabetes mellitus without complications: Secondary | ICD-10-CM | POA: Diagnosis not present

## 2012-02-29 DIAGNOSIS — H35379 Puckering of macula, unspecified eye: Secondary | ICD-10-CM | POA: Diagnosis not present

## 2012-02-29 DIAGNOSIS — H35359 Cystoid macular degeneration, unspecified eye: Secondary | ICD-10-CM | POA: Diagnosis not present

## 2012-02-29 DIAGNOSIS — H43819 Vitreous degeneration, unspecified eye: Secondary | ICD-10-CM | POA: Diagnosis not present

## 2012-02-29 DIAGNOSIS — E11311 Type 2 diabetes mellitus with unspecified diabetic retinopathy with macular edema: Secondary | ICD-10-CM | POA: Diagnosis not present

## 2012-04-05 DIAGNOSIS — E119 Type 2 diabetes mellitus without complications: Secondary | ICD-10-CM | POA: Diagnosis not present

## 2012-04-05 DIAGNOSIS — E782 Mixed hyperlipidemia: Secondary | ICD-10-CM | POA: Diagnosis not present

## 2012-04-05 DIAGNOSIS — M109 Gout, unspecified: Secondary | ICD-10-CM | POA: Diagnosis not present

## 2012-04-06 DIAGNOSIS — M109 Gout, unspecified: Secondary | ICD-10-CM | POA: Diagnosis not present

## 2012-04-06 DIAGNOSIS — E119 Type 2 diabetes mellitus without complications: Secondary | ICD-10-CM | POA: Diagnosis not present

## 2012-04-06 DIAGNOSIS — I10 Essential (primary) hypertension: Secondary | ICD-10-CM | POA: Diagnosis not present

## 2012-04-06 DIAGNOSIS — E782 Mixed hyperlipidemia: Secondary | ICD-10-CM | POA: Diagnosis not present

## 2012-04-21 ENCOUNTER — Other Ambulatory Visit: Payer: Self-pay | Admitting: Cardiology

## 2012-06-03 DIAGNOSIS — Z961 Presence of intraocular lens: Secondary | ICD-10-CM | POA: Diagnosis not present

## 2012-06-03 DIAGNOSIS — E119 Type 2 diabetes mellitus without complications: Secondary | ICD-10-CM | POA: Diagnosis not present

## 2012-06-03 DIAGNOSIS — E11319 Type 2 diabetes mellitus with unspecified diabetic retinopathy without macular edema: Secondary | ICD-10-CM | POA: Diagnosis not present

## 2012-06-07 DIAGNOSIS — M62838 Other muscle spasm: Secondary | ICD-10-CM | POA: Diagnosis not present

## 2012-06-07 DIAGNOSIS — M999 Biomechanical lesion, unspecified: Secondary | ICD-10-CM | POA: Diagnosis not present

## 2012-06-09 DIAGNOSIS — M62838 Other muscle spasm: Secondary | ICD-10-CM | POA: Diagnosis not present

## 2012-06-09 DIAGNOSIS — M999 Biomechanical lesion, unspecified: Secondary | ICD-10-CM | POA: Diagnosis not present

## 2012-06-14 DIAGNOSIS — M999 Biomechanical lesion, unspecified: Secondary | ICD-10-CM | POA: Diagnosis not present

## 2012-06-14 DIAGNOSIS — M62838 Other muscle spasm: Secondary | ICD-10-CM | POA: Diagnosis not present

## 2012-06-28 DIAGNOSIS — M62838 Other muscle spasm: Secondary | ICD-10-CM | POA: Diagnosis not present

## 2012-06-28 DIAGNOSIS — M999 Biomechanical lesion, unspecified: Secondary | ICD-10-CM | POA: Diagnosis not present

## 2012-06-30 DIAGNOSIS — H35379 Puckering of macula, unspecified eye: Secondary | ICD-10-CM | POA: Diagnosis not present

## 2012-06-30 DIAGNOSIS — H35359 Cystoid macular degeneration, unspecified eye: Secondary | ICD-10-CM | POA: Diagnosis not present

## 2012-06-30 DIAGNOSIS — H43819 Vitreous degeneration, unspecified eye: Secondary | ICD-10-CM | POA: Diagnosis not present

## 2012-06-30 DIAGNOSIS — E1139 Type 2 diabetes mellitus with other diabetic ophthalmic complication: Secondary | ICD-10-CM | POA: Diagnosis not present

## 2012-07-12 DIAGNOSIS — E119 Type 2 diabetes mellitus without complications: Secondary | ICD-10-CM | POA: Diagnosis not present

## 2012-07-12 DIAGNOSIS — E782 Mixed hyperlipidemia: Secondary | ICD-10-CM | POA: Diagnosis not present

## 2012-07-13 DIAGNOSIS — M62838 Other muscle spasm: Secondary | ICD-10-CM | POA: Diagnosis not present

## 2012-07-13 DIAGNOSIS — M999 Biomechanical lesion, unspecified: Secondary | ICD-10-CM | POA: Diagnosis not present

## 2012-07-18 DIAGNOSIS — E782 Mixed hyperlipidemia: Secondary | ICD-10-CM | POA: Diagnosis not present

## 2012-07-18 DIAGNOSIS — M109 Gout, unspecified: Secondary | ICD-10-CM | POA: Diagnosis not present

## 2012-07-18 DIAGNOSIS — I10 Essential (primary) hypertension: Secondary | ICD-10-CM | POA: Diagnosis not present

## 2012-07-18 DIAGNOSIS — E1169 Type 2 diabetes mellitus with other specified complication: Secondary | ICD-10-CM | POA: Diagnosis not present

## 2012-07-18 DIAGNOSIS — I2510 Atherosclerotic heart disease of native coronary artery without angina pectoris: Secondary | ICD-10-CM | POA: Diagnosis not present

## 2012-07-27 ENCOUNTER — Other Ambulatory Visit: Payer: Self-pay | Admitting: Cardiology

## 2012-08-09 ENCOUNTER — Telehealth: Payer: Self-pay | Admitting: Cardiology

## 2012-08-09 DIAGNOSIS — I714 Abdominal aortic aneurysm, without rupture: Secondary | ICD-10-CM

## 2012-08-09 NOTE — Telephone Encounter (Signed)
Pt wanted to know if needs aorta u/s prior to app October 2nd? Told pt DR/Nurse out of office till 8/29 and will call then to advise, pt agreed to plan.

## 2012-08-09 NOTE — Telephone Encounter (Signed)
New Problem:    Patient called in wanting to know if Dr. Daleen Squibb was comfortable with him waiting until October for him to be seen.  Patient would also like to know if he needs to have an aorta ultrasound performed in the meantime. Please call back.

## 2012-08-10 NOTE — Telephone Encounter (Signed)
LMOVM pt does need abd aorta ultrasound in October.  Order placed. Mylo Red RN

## 2012-08-10 NOTE — Telephone Encounter (Signed)
Pt scheduled for AA Duplex on 10/1 at 0800 Understands NPO after midnight Mylo Red RN

## 2012-09-13 ENCOUNTER — Encounter (INDEPENDENT_AMBULATORY_CARE_PROVIDER_SITE_OTHER): Payer: Medicare Other

## 2012-09-13 DIAGNOSIS — I714 Abdominal aortic aneurysm, without rupture: Secondary | ICD-10-CM | POA: Diagnosis not present

## 2012-09-14 ENCOUNTER — Encounter: Payer: Self-pay | Admitting: Cardiology

## 2012-09-14 ENCOUNTER — Ambulatory Visit (INDEPENDENT_AMBULATORY_CARE_PROVIDER_SITE_OTHER): Payer: Medicare Other | Admitting: Cardiology

## 2012-09-14 VITALS — BP 104/59 | HR 66 | Ht 70.5 in | Wt 195.4 lb

## 2012-09-14 DIAGNOSIS — E785 Hyperlipidemia, unspecified: Secondary | ICD-10-CM

## 2012-09-14 DIAGNOSIS — R892 Abnormal level of other drugs, medicaments and biological substances in specimens from other organs, systems and tissues: Secondary | ICD-10-CM

## 2012-09-14 DIAGNOSIS — I25810 Atherosclerosis of coronary artery bypass graft(s) without angina pectoris: Secondary | ICD-10-CM | POA: Insufficient documentation

## 2012-09-14 DIAGNOSIS — R5381 Other malaise: Secondary | ICD-10-CM

## 2012-09-14 DIAGNOSIS — Z954 Presence of other heart-valve replacement: Secondary | ICD-10-CM | POA: Diagnosis not present

## 2012-09-14 DIAGNOSIS — I739 Peripheral vascular disease, unspecified: Secondary | ICD-10-CM

## 2012-09-14 DIAGNOSIS — R7889 Finding of other specified substances, not normally found in blood: Secondary | ICD-10-CM

## 2012-09-14 DIAGNOSIS — I714 Abdominal aortic aneurysm, without rupture: Secondary | ICD-10-CM

## 2012-09-14 DIAGNOSIS — R5383 Other fatigue: Secondary | ICD-10-CM

## 2012-09-14 DIAGNOSIS — Z5181 Encounter for therapeutic drug level monitoring: Secondary | ICD-10-CM

## 2012-09-14 DIAGNOSIS — I779 Disorder of arteries and arterioles, unspecified: Secondary | ICD-10-CM | POA: Diagnosis not present

## 2012-09-14 DIAGNOSIS — Z953 Presence of xenogenic heart valve: Secondary | ICD-10-CM | POA: Insufficient documentation

## 2012-09-14 DIAGNOSIS — I10 Essential (primary) hypertension: Secondary | ICD-10-CM | POA: Insufficient documentation

## 2012-09-14 NOTE — Patient Instructions (Addendum)
Your physician has requested that you have a carotid duplex. This test is an ultrasound of the carotid arteries in your neck. It looks at blood flow through these arteries that supply the brain with blood. Allow one hour for this exam. There are no restrictions or special instructions.   Your physician recommends that you continue on your current medications as directed. Please refer to the Current Medication list given to you today.  Your physician wants you to follow-up in: 1 year with Dr. Daleen Squibb. You will receive a reminder letter in the mail two months in advance. If you don't receive a letter, please call our office to schedule the follow-up appointment.  Your physician has requested that you have an abdominal aorta duplex in 6 months..During this test, an ultrasound is used to evaluate the aorta. Allow 30 minutes for this exam. Do not eat after midnight the day before and avoid carbonated beverages

## 2012-09-14 NOTE — Progress Notes (Signed)
HPI [name] returns today for evaluation and management of his complex cardiovascular history.  He denies any angina or chest discomfort. Other than generalized fatigue he really has no specific complaints. He does have some claudication and was evaluated by Dr. Hart Rochester last fall with peripheral Dopplers. This showed bilateral femoral artery stenosis in the 75% range.  Abdominal ultrasound yesterday which showed a slight increase in his abdominal aortic aneurysm 2 4 x 4 cm. In February 2012 was 3.7 x 3.4. He denies any abdominal or back pain.  His lipids and other blood work is being followed by Dr. Leslie Dales.  Past Medical History  Diagnosis Date  . CAD (coronary artery disease)   . Atrial fibrillation   . Diabetes mellitus   . Hyperlipemia   . Hx: UTI (urinary tract infection)   . Gout   . Hypertension   . Cataract   . GERD (gastroesophageal reflux disease)     Current Outpatient Prescriptions  Medication Sig Dispense Refill  . ACTOS 30 MG tablet Take 30 mg by mouth daily.       Marland Kitchen allopurinol (ZYLOPRIM) 100 MG tablet Take 100 mg by mouth daily.        Marland Kitchen aspirin 81 MG tablet Take 81 mg by mouth daily.        Marland Kitchen atorvastatin (LIPITOR) 20 MG tablet Take 20 mg by mouth daily.      Marland Kitchen doxazosin (CARDURA) 1 MG tablet Take 1 mg by mouth 3 (three) times daily. 3 daily      . LANOXIN 0.25 MG tablet TAKE ONE TABLET EVERY DAY  30 tablet  3  . LOVAZA 1 G capsule Take 1 g by mouth 3 (three) times daily. 3 daily      . ramipril (ALTACE) 5 MG capsule TAKE ONE CAPSULE EACH DAY  90 capsule  1  . ZETIA 10 MG tablet Take 10 mg by mouth daily.        Current Facility-Administered Medications  Medication Dose Route Frequency Provider Last Rate Last Dose  . DISCONTD: 0.9 %  sodium chloride infusion  500 mL Intravenous Continuous Hilarie Fredrickson, MD        No Known Allergies  Family History  Problem Relation Age of Onset  . Colon cancer Neg Hx   . Heart disease Father     History   Social  History  . Marital Status: Widowed    Spouse Name: N/A    Number of Children: N/A  . Years of Education: N/A   Occupational History  . [employer]     Social History Main Topics  . Smoking status: Former Smoker -- 20 years    Types: Cigarettes  . Smokeless tobacco: Never Used  . Alcohol Use: Yes     1-2 drinks daily   . Drug Use: No  . Sexually Active: Not on file   Other Topics Concern  . Not on file   Social History Narrative   1 caffeine drink daily     ROS ALL NEGATIVE EXCEPT THOSE NOTED IN HPI  PE  General Appearance: well developed, well nourished in no acute distress HEENT: symmetrical face, PERRLA, good dentition  Neck: no JVD, thyromegaly, or adenopathy, trachea midline Chest: symmetric without deformity Cardiac: PMI non-displaced, RRR, normal S1, S2, no gallop or murmur Lung: clear to ausculation and percussion Vascular: Pulses diminished in the lower extremities bilaterally, bilateral carotid bruits  Abdominal: nondistended, nontender, good bowel sounds, no HSM, no bruits Extremities: no cyanosis,  clubbing or edema, no sign of DVT, no varicosities  Skin: normal color, no rashes Neuro: alert and oriented x 3, non-focal Pysch: normal affect  EKG Normal sinus rhythm with occasional PVCs BMET    Component Value Date/Time   NA 138 05/08/2009   K 4.0 05/08/2009   CL 101 05/08/2009   CO2 26 05/08/2009   GLUCOSE 141 05/08/2009   GLUCOSE 94 10/11/2006 1605   BUN 11 05/08/2009   CREATININE 1.0 05/08/2009   CALCIUM 9.1 05/08/2009   GFRNONAA >60 11/13/2007 0523   GFRAA  Value: >60        The eGFR has been calculated using the MDRD equation. This calculation has not been validated in all clinical 11/13/2007 0523    Lipid Panel     Component Value Date/Time   CHOL 148 05/08/2009   HDL 46 05/08/2009    CBC    Component Value Date/Time   WBC 7.0 11/13/2007 0523   RBC 4.00* 11/13/2007 0523   HGB 12.4 DELTA CHECK NOTED* 11/13/2007 0523   HCT 36.5* 11/13/2007  0523   PLT 255 11/13/2007 0523   MCV 91.2 11/13/2007 0523   MCHC 34.0 11/13/2007 0523   RDW 14.5 11/13/2007 0523   LYMPHSABS 0.9 11/11/2007 2042   MONOABS 0.6 11/11/2007 2042   EOSABS 0.1* 11/11/2007 2042   BASOSABS 0.0 11/11/2007 2042

## 2012-09-30 ENCOUNTER — Encounter (INDEPENDENT_AMBULATORY_CARE_PROVIDER_SITE_OTHER): Payer: Medicare Other

## 2012-09-30 DIAGNOSIS — I6529 Occlusion and stenosis of unspecified carotid artery: Secondary | ICD-10-CM

## 2012-10-13 ENCOUNTER — Other Ambulatory Visit: Payer: Self-pay | Admitting: *Deleted

## 2012-10-13 DIAGNOSIS — R0989 Other specified symptoms and signs involving the circulatory and respiratory systems: Secondary | ICD-10-CM

## 2012-10-18 DIAGNOSIS — E1169 Type 2 diabetes mellitus with other specified complication: Secondary | ICD-10-CM | POA: Diagnosis not present

## 2012-10-18 DIAGNOSIS — E782 Mixed hyperlipidemia: Secondary | ICD-10-CM | POA: Diagnosis not present

## 2012-10-20 DIAGNOSIS — I10 Essential (primary) hypertension: Secondary | ICD-10-CM | POA: Diagnosis not present

## 2012-10-20 DIAGNOSIS — E782 Mixed hyperlipidemia: Secondary | ICD-10-CM | POA: Diagnosis not present

## 2012-10-20 DIAGNOSIS — E1169 Type 2 diabetes mellitus with other specified complication: Secondary | ICD-10-CM | POA: Diagnosis not present

## 2012-10-20 DIAGNOSIS — M109 Gout, unspecified: Secondary | ICD-10-CM | POA: Diagnosis not present

## 2012-10-20 DIAGNOSIS — I2510 Atherosclerotic heart disease of native coronary artery without angina pectoris: Secondary | ICD-10-CM | POA: Diagnosis not present

## 2012-10-20 DIAGNOSIS — Z23 Encounter for immunization: Secondary | ICD-10-CM | POA: Diagnosis not present

## 2013-01-24 DIAGNOSIS — E782 Mixed hyperlipidemia: Secondary | ICD-10-CM | POA: Diagnosis not present

## 2013-01-24 DIAGNOSIS — M109 Gout, unspecified: Secondary | ICD-10-CM | POA: Diagnosis not present

## 2013-01-24 DIAGNOSIS — E1169 Type 2 diabetes mellitus with other specified complication: Secondary | ICD-10-CM | POA: Diagnosis not present

## 2013-01-27 ENCOUNTER — Other Ambulatory Visit: Payer: Self-pay | Admitting: Cardiology

## 2013-01-31 DIAGNOSIS — M109 Gout, unspecified: Secondary | ICD-10-CM | POA: Diagnosis not present

## 2013-01-31 DIAGNOSIS — I10 Essential (primary) hypertension: Secondary | ICD-10-CM | POA: Diagnosis not present

## 2013-01-31 DIAGNOSIS — E782 Mixed hyperlipidemia: Secondary | ICD-10-CM | POA: Diagnosis not present

## 2013-01-31 DIAGNOSIS — E1169 Type 2 diabetes mellitus with other specified complication: Secondary | ICD-10-CM | POA: Diagnosis not present

## 2013-01-31 DIAGNOSIS — I2510 Atherosclerotic heart disease of native coronary artery without angina pectoris: Secondary | ICD-10-CM | POA: Diagnosis not present

## 2013-03-14 ENCOUNTER — Other Ambulatory Visit: Payer: Self-pay

## 2013-03-14 MED ORDER — DIGOXIN 250 MCG PO TABS: ORAL_TABLET | ORAL | Status: DC

## 2013-03-21 DIAGNOSIS — H1045 Other chronic allergic conjunctivitis: Secondary | ICD-10-CM | POA: Diagnosis not present

## 2013-03-21 DIAGNOSIS — H18219 Corneal edema secondary to contact lens, unspecified eye: Secondary | ICD-10-CM | POA: Diagnosis not present

## 2013-03-21 DIAGNOSIS — E119 Type 2 diabetes mellitus without complications: Secondary | ICD-10-CM | POA: Diagnosis not present

## 2013-03-21 DIAGNOSIS — Z961 Presence of intraocular lens: Secondary | ICD-10-CM | POA: Diagnosis not present

## 2013-03-28 DIAGNOSIS — H1045 Other chronic allergic conjunctivitis: Secondary | ICD-10-CM | POA: Diagnosis not present

## 2013-03-28 DIAGNOSIS — H04129 Dry eye syndrome of unspecified lacrimal gland: Secondary | ICD-10-CM | POA: Diagnosis not present

## 2013-03-28 DIAGNOSIS — H3581 Retinal edema: Secondary | ICD-10-CM | POA: Diagnosis not present

## 2013-03-28 DIAGNOSIS — H35379 Puckering of macula, unspecified eye: Secondary | ICD-10-CM | POA: Diagnosis not present

## 2013-04-05 ENCOUNTER — Encounter (INDEPENDENT_AMBULATORY_CARE_PROVIDER_SITE_OTHER): Payer: Medicare Other

## 2013-04-05 DIAGNOSIS — I714 Abdominal aortic aneurysm, without rupture, unspecified: Secondary | ICD-10-CM

## 2013-04-11 ENCOUNTER — Encounter (INDEPENDENT_AMBULATORY_CARE_PROVIDER_SITE_OTHER): Payer: Medicare Other

## 2013-04-11 DIAGNOSIS — I6529 Occlusion and stenosis of unspecified carotid artery: Secondary | ICD-10-CM | POA: Diagnosis not present

## 2013-04-27 DIAGNOSIS — E782 Mixed hyperlipidemia: Secondary | ICD-10-CM | POA: Diagnosis not present

## 2013-04-27 DIAGNOSIS — E1169 Type 2 diabetes mellitus with other specified complication: Secondary | ICD-10-CM | POA: Diagnosis not present

## 2013-05-02 DIAGNOSIS — I2510 Atherosclerotic heart disease of native coronary artery without angina pectoris: Secondary | ICD-10-CM | POA: Diagnosis not present

## 2013-05-02 DIAGNOSIS — M109 Gout, unspecified: Secondary | ICD-10-CM | POA: Diagnosis not present

## 2013-05-02 DIAGNOSIS — E1169 Type 2 diabetes mellitus with other specified complication: Secondary | ICD-10-CM | POA: Diagnosis not present

## 2013-05-02 DIAGNOSIS — I10 Essential (primary) hypertension: Secondary | ICD-10-CM | POA: Diagnosis not present

## 2013-05-02 DIAGNOSIS — E782 Mixed hyperlipidemia: Secondary | ICD-10-CM | POA: Diagnosis not present

## 2013-08-03 DIAGNOSIS — E782 Mixed hyperlipidemia: Secondary | ICD-10-CM | POA: Diagnosis not present

## 2013-08-03 DIAGNOSIS — E1169 Type 2 diabetes mellitus with other specified complication: Secondary | ICD-10-CM | POA: Diagnosis not present

## 2013-08-15 DIAGNOSIS — E1169 Type 2 diabetes mellitus with other specified complication: Secondary | ICD-10-CM | POA: Diagnosis not present

## 2013-08-15 DIAGNOSIS — E782 Mixed hyperlipidemia: Secondary | ICD-10-CM | POA: Diagnosis not present

## 2013-08-15 DIAGNOSIS — I2510 Atherosclerotic heart disease of native coronary artery without angina pectoris: Secondary | ICD-10-CM | POA: Diagnosis not present

## 2013-08-15 DIAGNOSIS — I10 Essential (primary) hypertension: Secondary | ICD-10-CM | POA: Diagnosis not present

## 2013-08-15 DIAGNOSIS — M109 Gout, unspecified: Secondary | ICD-10-CM | POA: Diagnosis not present

## 2013-09-25 ENCOUNTER — Other Ambulatory Visit: Payer: Self-pay | Admitting: Cardiology

## 2013-10-03 ENCOUNTER — Ambulatory Visit (INDEPENDENT_AMBULATORY_CARE_PROVIDER_SITE_OTHER): Payer: Medicare Other | Admitting: Cardiology

## 2013-10-03 ENCOUNTER — Encounter: Payer: Self-pay | Admitting: Cardiology

## 2013-10-03 VITALS — BP 140/90 | HR 62 | Ht 70.5 in | Wt 192.0 lb

## 2013-10-03 DIAGNOSIS — I739 Peripheral vascular disease, unspecified: Secondary | ICD-10-CM

## 2013-10-03 DIAGNOSIS — I25810 Atherosclerosis of coronary artery bypass graft(s) without angina pectoris: Secondary | ICD-10-CM

## 2013-10-03 DIAGNOSIS — Z953 Presence of xenogenic heart valve: Secondary | ICD-10-CM

## 2013-10-03 DIAGNOSIS — I779 Disorder of arteries and arterioles, unspecified: Secondary | ICD-10-CM | POA: Diagnosis not present

## 2013-10-03 DIAGNOSIS — Z952 Presence of prosthetic heart valve: Secondary | ICD-10-CM

## 2013-10-03 DIAGNOSIS — I714 Abdominal aortic aneurysm, without rupture, unspecified: Secondary | ICD-10-CM

## 2013-10-03 DIAGNOSIS — E785 Hyperlipidemia, unspecified: Secondary | ICD-10-CM

## 2013-10-03 NOTE — Patient Instructions (Signed)
Stop Digoxin (Lanoxin).   Your physician has requested that you have an echocardiogram. Echocardiography is a painless test that uses sound waves to create images of your heart. It provides your doctor with information about the size and shape of your heart and how well your heart's chambers and valves are working. This procedure takes approximately one hour. There are no restrictions for this procedure.  Your physician has requested that you have a carotid duplex. This test is an ultrasound of the carotid arteries in your neck. It looks at blood flow through these arteries that supply the brain with blood. Allow one hour for this exam. There are no restrictions or special instructions.  Your physician has requested that you have a lower  extremity arterial duplex. This test is an ultrasound of the arteries in the legs It looks at arterial blood flow in the legs. Allow one hour for Lower  Arterial scans. There are no restrictions or special instructions   Your physician recommends that you schedule a follow-up appointment in: 1 month with Dr Shirlee Latch. This is scheduled for 11/02/13 at 2:45PM

## 2013-10-03 NOTE — Progress Notes (Signed)
Patient ID: Nathaniel Bruntz., male   DOB: Dec 16, 1931, 79 y.o.   MRN: [id number] PCP: Dr. Leslie Dales  79 yo with history of extensive vascular disease including CAD s/p CABG and redo CABG, AAA, PAD, and carotid stenosis as well as prior AVR presents for cardiology followup.  He has seen Dr. Daleen Squibb in the past and is seeing me for the first time today.  I reviewed all his old records.  He has had documentation of significant peripheral arterial disease.  He gets bilateral calf soreness after walking for about 15 minutes.  This resolves with rest, no rest pain.  He has moderate carotid stenosis.  No stroke-like symptoms.  He has a stable 4 cm AAA.  He plays golf and walks for exercise.  No exertional dyspnea or chest pain.  He had post-operative atrial fibrillation after cardiac surgery but has not had atrial fibrillation documented since then and denies tachypalpitations.    ECG: NSR, incomplete LBBB  PMH: 1. PAD: Peripheral arterial dopplers in 2012 showed > 75% bilateral SFA stenosis.  2. AAA: Last [place] in 4/14 showed 4 x 3.4 cm AAA.  3. Carotid stenosis: 60-79% RICA stenosis (upper end of range), 60-79% LICA stenosis.  4. CAD: CABG 1989 with LIMA-LAD, SVG-D, seq SVG-ramus and OM, sequential SVG-PDA/PLV.  SVG-PDA/PLV found to be occluded on cath prior to AVR, so patient had SVG-PDA with AVR in 2007.  5. Severe aortic stenosis: Bioprosthetic AVR in 2007.  Echo (11/10) with EF 60-65%, basal to mid inferior hypokinesis, bioprosthetic aortic valve without significant stenosis/regurgitation, mild MR.   6. Atrial fibrillation: Paroxysmal.  Only noted post-operatively after cardiac surgery.  7. Type II diabetes 8. Gout 9. GERD 10. Hyperlipidemia 11. HTN 12. GERD  SH: Widower, 2 children, raised his 3 grand-daughters, lives in [place], [place].  Occasional ETOH, no smoking.   FH: Father died from ruptured AAA.   ROS: All systems reviewed and negative except as per HPI.   Current Outpatient  Prescriptions  Medication Sig Dispense Refill  . ACTOS 30 MG tablet Take 30 mg by mouth daily.       Marland Kitchen allopurinol (ZYLOPRIM) 100 MG tablet Take 100 mg by mouth daily.        Marland Kitchen aspirin 81 MG tablet Take 81 mg by mouth daily.        Marland Kitchen atorvastatin (LIPITOR) 20 MG tablet Take 20 mg by mouth daily.      Marland Kitchen doxazosin (CARDURA) 1 MG tablet at bedtime. Take 2 in the am and 1 in the pm      . LOVAZA 1 G capsule Take 1 g by mouth 3 (three) times daily. 3 daily      . ramipril (ALTACE) 5 MG capsule TAKE ONE CAPSULE EACH DAY  90 capsule  3  . ZETIA 10 MG tablet Take 10 mg by mouth daily.        No current facility-administered medications for this visit.    BP 140/90  Pulse 62  Ht 5' 10.5" (1.791 m)  Wt 87.091 kg (192 lb)  BMI 27.15 kg/m2 General: NAD Neck: No JVD, no thyromegaly or thyroid nodule.  Lungs: Clear to auscultation bilaterally with normal respiratory effort. CV: Nondisplaced PMI.  Heart regular S1/S2, no S3/S4, 2/6 SEM RUSB.  No peripheral edema.  Soft right carotid bruit.  1+ left PT, unable to feel right PT.   Abdomen: Soft, nontender, no hepatosplenomegaly, no distention.  Skin: Intact without lesions or rashes.  Neurologic: Alert and oriented x  3.  Psych: Normal affect. Extremities: No clubbing or cyanosis.   1. CAD: s/p CABG and redo CABG.  Stable with no ischemic symptoms.  Continue ASA 81, statin, ramipril.   2. Hyperlipidemia: He is on atorvastatin, Zetia, and Lovaza.  He had his lipids checked by Dr. Leslie Dales last month, we will call for a copy.  3. PAD: Patient has moderate claudication symptoms.  No rest symptoms or foot ulcers.  I will arrange for peripheral arterial dopplers to followup on degree of stenosis.  He likely would be a cilostazol candidate.   4. Carotid stenosis: Needs repeat carotid dopplers this month.  5. AAA: repeat abdominal US in 4/15.  6. Bioprosthetic AVR: I will get an echo to assess the bioprosthetic aortic valve.   7. Paroxysmal atrial  fibrillation: Not documented since peri-operative period after CABG/AVR.  He is on digoxin at this time.  I am not sure why he is on digoxin and do not see a definite indication for this medicine.  I am going to stop digoxin today.   Followup in 1 months.   [name] 10/03/2013

## 2013-10-06 ENCOUNTER — Ambulatory Visit (HOSPITAL_COMMUNITY): Payer: Medicare Other | Attending: Cardiology

## 2013-10-06 DIAGNOSIS — E119 Type 2 diabetes mellitus without complications: Secondary | ICD-10-CM | POA: Insufficient documentation

## 2013-10-06 DIAGNOSIS — I739 Peripheral vascular disease, unspecified: Secondary | ICD-10-CM

## 2013-10-06 DIAGNOSIS — E785 Hyperlipidemia, unspecified: Secondary | ICD-10-CM | POA: Diagnosis not present

## 2013-10-06 DIAGNOSIS — I658 Occlusion and stenosis of other precerebral arteries: Secondary | ICD-10-CM | POA: Insufficient documentation

## 2013-10-06 DIAGNOSIS — I25810 Atherosclerosis of coronary artery bypass graft(s) without angina pectoris: Secondary | ICD-10-CM

## 2013-10-06 DIAGNOSIS — Z951 Presence of aortocoronary bypass graft: Secondary | ICD-10-CM | POA: Insufficient documentation

## 2013-10-06 DIAGNOSIS — I6529 Occlusion and stenosis of unspecified carotid artery: Secondary | ICD-10-CM | POA: Insufficient documentation

## 2013-10-06 DIAGNOSIS — Z87891 Personal history of nicotine dependence: Secondary | ICD-10-CM | POA: Insufficient documentation

## 2013-10-06 DIAGNOSIS — Z953 Presence of xenogenic heart valve: Secondary | ICD-10-CM

## 2013-10-06 DIAGNOSIS — I2510 Atherosclerotic heart disease of native coronary artery without angina pectoris: Secondary | ICD-10-CM | POA: Diagnosis not present

## 2013-10-06 DIAGNOSIS — R0989 Other specified symptoms and signs involving the circulatory and respiratory systems: Secondary | ICD-10-CM | POA: Diagnosis not present

## 2013-10-10 ENCOUNTER — Ambulatory Visit (HOSPITAL_COMMUNITY): Payer: Medicare Other | Attending: Cardiology

## 2013-10-10 DIAGNOSIS — I70219 Atherosclerosis of native arteries of extremities with intermittent claudication, unspecified extremity: Secondary | ICD-10-CM | POA: Diagnosis not present

## 2013-10-10 DIAGNOSIS — I25810 Atherosclerosis of coronary artery bypass graft(s) without angina pectoris: Secondary | ICD-10-CM

## 2013-10-10 DIAGNOSIS — I739 Peripheral vascular disease, unspecified: Secondary | ICD-10-CM | POA: Diagnosis not present

## 2013-10-10 DIAGNOSIS — Z953 Presence of xenogenic heart valve: Secondary | ICD-10-CM

## 2013-10-18 ENCOUNTER — Ambulatory Visit (HOSPITAL_COMMUNITY): Payer: Medicare Other | Attending: Cardiology | Admitting: Cardiology

## 2013-10-18 DIAGNOSIS — I10 Essential (primary) hypertension: Secondary | ICD-10-CM | POA: Insufficient documentation

## 2013-10-18 DIAGNOSIS — I079 Rheumatic tricuspid valve disease, unspecified: Secondary | ICD-10-CM | POA: Insufficient documentation

## 2013-10-18 DIAGNOSIS — E785 Hyperlipidemia, unspecified: Secondary | ICD-10-CM | POA: Diagnosis not present

## 2013-10-18 DIAGNOSIS — I4891 Unspecified atrial fibrillation: Secondary | ICD-10-CM | POA: Diagnosis not present

## 2013-10-18 DIAGNOSIS — I739 Peripheral vascular disease, unspecified: Secondary | ICD-10-CM

## 2013-10-18 DIAGNOSIS — I25810 Atherosclerosis of coronary artery bypass graft(s) without angina pectoris: Secondary | ICD-10-CM | POA: Diagnosis not present

## 2013-10-18 DIAGNOSIS — I2510 Atherosclerotic heart disease of native coronary artery without angina pectoris: Secondary | ICD-10-CM | POA: Insufficient documentation

## 2013-10-18 DIAGNOSIS — Z954 Presence of other heart-valve replacement: Secondary | ICD-10-CM | POA: Diagnosis not present

## 2013-10-18 DIAGNOSIS — Z952 Presence of prosthetic heart valve: Secondary | ICD-10-CM | POA: Insufficient documentation

## 2013-10-18 DIAGNOSIS — Z953 Presence of xenogenic heart valve: Secondary | ICD-10-CM

## 2013-10-18 NOTE — Progress Notes (Signed)
Echo performed. 

## 2013-10-19 ENCOUNTER — Telehealth: Payer: Self-pay | Admitting: Cardiology

## 2013-10-19 NOTE — Telephone Encounter (Signed)
I spoke with Mr. Breeden & rescheduled his appointment for March. Appointment reminder mailed to pt. Mylo Red RN

## 2013-10-19 NOTE — Telephone Encounter (Signed)
Walk in pt Form " Needs Test results" Gave To MEssage nurse  10/19/13/KM

## 2013-11-02 ENCOUNTER — Other Ambulatory Visit: Payer: Self-pay | Admitting: Pharmacist

## 2013-11-02 ENCOUNTER — Ambulatory Visit: Payer: Medicare Other | Admitting: Cardiology

## 2013-11-16 DIAGNOSIS — I10 Essential (primary) hypertension: Secondary | ICD-10-CM | POA: Diagnosis not present

## 2013-11-16 DIAGNOSIS — I2510 Atherosclerotic heart disease of native coronary artery without angina pectoris: Secondary | ICD-10-CM | POA: Diagnosis not present

## 2013-11-16 DIAGNOSIS — E1169 Type 2 diabetes mellitus with other specified complication: Secondary | ICD-10-CM | POA: Diagnosis not present

## 2013-11-16 DIAGNOSIS — E782 Mixed hyperlipidemia: Secondary | ICD-10-CM | POA: Diagnosis not present

## 2013-11-21 DIAGNOSIS — E782 Mixed hyperlipidemia: Secondary | ICD-10-CM | POA: Diagnosis not present

## 2013-11-21 DIAGNOSIS — E1169 Type 2 diabetes mellitus with other specified complication: Secondary | ICD-10-CM | POA: Diagnosis not present

## 2013-11-21 DIAGNOSIS — Z23 Encounter for immunization: Secondary | ICD-10-CM | POA: Diagnosis not present

## 2013-11-21 DIAGNOSIS — M109 Gout, unspecified: Secondary | ICD-10-CM | POA: Diagnosis not present

## 2013-11-21 DIAGNOSIS — I2510 Atherosclerotic heart disease of native coronary artery without angina pectoris: Secondary | ICD-10-CM | POA: Diagnosis not present

## 2013-11-21 DIAGNOSIS — I10 Essential (primary) hypertension: Secondary | ICD-10-CM | POA: Diagnosis not present

## 2014-01-22 DIAGNOSIS — J209 Acute bronchitis, unspecified: Secondary | ICD-10-CM | POA: Diagnosis not present

## 2014-01-22 DIAGNOSIS — I10 Essential (primary) hypertension: Secondary | ICD-10-CM | POA: Diagnosis not present

## 2014-01-22 DIAGNOSIS — I2510 Atherosclerotic heart disease of native coronary artery without angina pectoris: Secondary | ICD-10-CM | POA: Diagnosis not present

## 2014-01-22 DIAGNOSIS — E1169 Type 2 diabetes mellitus with other specified complication: Secondary | ICD-10-CM | POA: Diagnosis not present

## 2014-02-16 ENCOUNTER — Encounter: Payer: Self-pay | Admitting: *Deleted

## 2014-02-16 ENCOUNTER — Ambulatory Visit (INDEPENDENT_AMBULATORY_CARE_PROVIDER_SITE_OTHER): Payer: Medicare Other | Admitting: Cardiology

## 2014-02-16 VITALS — BP 127/71 | HR 71 | Ht 70.5 in | Wt 206.1 lb

## 2014-02-16 DIAGNOSIS — I714 Abdominal aortic aneurysm, without rupture, unspecified: Secondary | ICD-10-CM | POA: Diagnosis not present

## 2014-02-16 DIAGNOSIS — I779 Disorder of arteries and arterioles, unspecified: Secondary | ICD-10-CM | POA: Diagnosis not present

## 2014-02-16 DIAGNOSIS — I25810 Atherosclerosis of coronary artery bypass graft(s) without angina pectoris: Secondary | ICD-10-CM | POA: Diagnosis not present

## 2014-02-16 DIAGNOSIS — I739 Peripheral vascular disease, unspecified: Secondary | ICD-10-CM | POA: Diagnosis not present

## 2014-02-16 DIAGNOSIS — Z953 Presence of xenogenic heart valve: Secondary | ICD-10-CM

## 2014-02-16 DIAGNOSIS — Z952 Presence of prosthetic heart valve: Secondary | ICD-10-CM

## 2014-02-16 NOTE — Patient Instructions (Signed)
Your physician has requested that you have a carotid duplex. This test is an ultrasound of the carotid arteries in your neck. It looks at blood flow through these arteries that supply the brain with blood. Allow one hour for this exam. There are no restrictions or special instructions. April 2015  Your physician has requested that you have an abdominal aorta duplex. During this test, an ultrasound is used to evaluate the aorta. Allow 30 minutes for this exam. Do not eat after midnight the day before and avoid carbonated beverages April 2015   Your physician wants you to follow-up in: 6 months with Dr Aundra Dubin. (September 2015).  You will receive a reminder letter in the mail two months in advance. If you don't receive a letter, please call our office to schedule the follow-up appointment.

## 2014-02-18 ENCOUNTER — Encounter: Payer: Self-pay | Admitting: Cardiology

## 2014-02-18 NOTE — Progress Notes (Addendum)
Patient ID: Nathaniel [name]., male   DOB: 13-Feb-1932, 80 y.o.   MRN: [id number] PCP: Dr. Elyse Hsu  80 yo with history of extensive vascular disease including CAD s/p CABG and redo CABG, AAA, PAD, and carotid stenosis as well as prior AVR presents for cardiology followup.   He has had documentation of significant peripheral arterial disease.  He gets bilateral calf soreness after walking for about 15 minutes.  This resolves with rest, no rest pain.  No pedal ulcers, though when he gets cuts on his feet, they heal slowly.  Peripheral arterial dopplers done in 10/14 showed > 50% focal SFA stenoses bilaterally.  He has moderate carotid stenosis.  No stroke-like symptoms.  He has a stable 4 cm AAA.  He plays golf and walks for exercise.  No exertional dyspnea or chest pain.  He had post-operative atrial fibrillation after cardiac surgery but has not had atrial fibrillation documented since then and denies tachypalpitations.    ECG: NSR, right axis deviation, inferior slight ST depression  PMH: 1. PAD: Peripheral arterial dopplers in 2012 showed > 75% bilateral SFA stenosis.  Peripheral arterial dopplers in 10/14 showed > 50% focal bilateral SFA stenoses.  2. AAA: Last [place] in 4/14 showed 4 x 3.4 cm AAA.  3. Carotid stenosis:  Carotid dopplers (10/14) with 60-79% bilateral ICA stenosis.  4. CAD: CABG 1989 with LIMA-LAD, SVG-D, seq SVG-ramus and OM, sequential SVG-PDA/PLV.  SVG-PDA/PLV found to be occluded on cath prior to AVR, so patient had SVG-PDA with AVR in 2007.  5. Severe aortic stenosis: Bioprosthetic AVR in 2007.  Echo (11/10) with EF 60-65%, basal to mid inferior hypokinesis, bioprosthetic aortic valve without significant stenosis/regurgitation, mild MR.  Echo (11/14) with EF 55-60%, mild LVH, bioprosthetic aortic valve looked ok.  6. Atrial fibrillation: Paroxysmal.  Only noted post-operatively after cardiac surgery.  7. Type II diabetes 8. Gout 9. GERD 10. Hyperlipidemia 11. HTN 12. GERD  SH:  Widower, 2 children, raised his 3 grand-daughters, lives in [place], [place].  Occasional ETOH, no smoking.   FH: Father died from ruptured AAA.   ROS: All systems reviewed and negative except as per HPI.   Current Outpatient Prescriptions  Medication Sig Dispense Refill  . ACTOS 30 MG tablet Take 30 mg by mouth daily.       Marland Kitchen allopurinol (ZYLOPRIM) 100 MG tablet Take 100 mg by mouth daily.        Marland Kitchen aspirin 81 MG tablet Take 81 mg by mouth daily.        Marland Kitchen atorvastatin (LIPITOR) 20 MG tablet Take 20 mg by mouth daily.      Marland Kitchen doxazosin (CARDURA) 1 MG tablet at bedtime. Take 2 in the am and 1 in the pm      . LOVAZA 1 G capsule Take 1 g by mouth 3 (three) times daily. 3 daily      . ramipril (ALTACE) 5 MG capsule TAKE ONE CAPSULE EACH DAY  90 capsule  3  . ZETIA 10 MG tablet Take 10 mg by mouth daily.        No current facility-administered medications for this visit.    BP 127/71  Pulse 71  Ht 5' 10.5" (1.791 m)  Wt 93.495 kg (206 lb 1.9 oz)  BMI 29.15 kg/m2 General: NAD Neck: No JVD, no thyromegaly or thyroid nodule.  Lungs: Clear to auscultation bilaterally with normal respiratory effort. CV: Nondisplaced PMI.  Heart regular S1/S2, no S3/S4, 2/6 SEM RUSB.  No peripheral edema.  Soft right carotid bruit.  1+ left PT, unable to feel right PT.   Abdomen: Soft, nontender, no hepatosplenomegaly, no distention.  Skin: Intact without lesions or rashes.  Neurologic: Alert and oriented x 3.  Psych: Normal affect. Extremities: No clubbing or cyanosis.   Assessment/Plan:  1. CAD: s/p CABG and redo CABG.  Stable with no ischemic symptoms.  Continue ASA 81, statin, ramipril.   2. Hyperlipidemia: He is on atorvastatin, Zetia, and Lovaza.  I will try to obtain a copy of last lipids from Dr. Elyse Hsu. He should have aggressive control given extensive vascular disease.  I would like LDL < 70.  3. PAD: Patient has moderate claudication symptoms.  No rest symptoms or foot ulcers. Peripheral  arterial dopplers showed > 50% bilateral focal SFA stenoses.  I talked to the patient about a trial of cilostazol +/- PV referral.  At this time, he does not want either.  We talked about trying to walk through the pain for a short distance as this may help encourage collaterals.  Symptoms have been stable.  4. Carotid stenosis: Moderate bilateral ICA stenosis, will repeat carotid dopplers in 4/15.   5. AAA: repeat abdominal US in 4/15.  6. Bioprosthetic AVR: Valve stable on 11/14 echo.  7. Paroxysmal atrial fibrillation: Not documented since peri-operative period after CABG/AVR.   [name] 02/18/2014  Patient is going to get a stent graft tomorrow for AAA with ulceration.  He has been stable symptomatically without dyspnea or chest pain.  He has a bioprosthetic aortic valve.  EF and valve looked ok on 11/14 echo.  No indication at this point for stress test.  He has a history of post-op atrial fibrillation (one time) so this will need to be watched for.  No further workup necessary at this time.   Larey Dresser 04/12/2014

## 2014-02-22 ENCOUNTER — Encounter: Payer: Self-pay | Admitting: Cardiology

## 2014-02-22 DIAGNOSIS — E782 Mixed hyperlipidemia: Secondary | ICD-10-CM | POA: Diagnosis not present

## 2014-02-22 DIAGNOSIS — E1169 Type 2 diabetes mellitus with other specified complication: Secondary | ICD-10-CM | POA: Diagnosis not present

## 2014-02-26 DIAGNOSIS — E782 Mixed hyperlipidemia: Secondary | ICD-10-CM | POA: Diagnosis not present

## 2014-02-26 DIAGNOSIS — E1169 Type 2 diabetes mellitus with other specified complication: Secondary | ICD-10-CM | POA: Diagnosis not present

## 2014-03-06 ENCOUNTER — Encounter (HOSPITAL_COMMUNITY): Payer: Medicare Other

## 2014-03-07 DIAGNOSIS — I10 Essential (primary) hypertension: Secondary | ICD-10-CM | POA: Diagnosis not present

## 2014-03-07 DIAGNOSIS — E1169 Type 2 diabetes mellitus with other specified complication: Secondary | ICD-10-CM | POA: Diagnosis not present

## 2014-03-07 DIAGNOSIS — J209 Acute bronchitis, unspecified: Secondary | ICD-10-CM | POA: Diagnosis not present

## 2014-03-07 DIAGNOSIS — I2510 Atherosclerotic heart disease of native coronary artery without angina pectoris: Secondary | ICD-10-CM | POA: Diagnosis not present

## 2014-03-22 ENCOUNTER — Encounter: Payer: Self-pay | Admitting: Internal Medicine

## 2014-03-27 ENCOUNTER — Encounter: Payer: Self-pay | Admitting: Cardiology

## 2014-03-27 ENCOUNTER — Ambulatory Visit (HOSPITAL_COMMUNITY): Payer: Medicare Other | Attending: Cardiology | Admitting: Cardiology

## 2014-03-27 DIAGNOSIS — I25810 Atherosclerosis of coronary artery bypass graft(s) without angina pectoris: Secondary | ICD-10-CM | POA: Diagnosis not present

## 2014-03-27 DIAGNOSIS — I779 Disorder of arteries and arterioles, unspecified: Secondary | ICD-10-CM

## 2014-03-27 DIAGNOSIS — I6529 Occlusion and stenosis of unspecified carotid artery: Secondary | ICD-10-CM | POA: Diagnosis not present

## 2014-03-27 DIAGNOSIS — I714 Abdominal aortic aneurysm, without rupture, unspecified: Secondary | ICD-10-CM | POA: Insufficient documentation

## 2014-03-27 DIAGNOSIS — I739 Peripheral vascular disease, unspecified: Secondary | ICD-10-CM

## 2014-03-27 NOTE — Progress Notes (Signed)
Carotid duplex completed 

## 2014-03-27 NOTE — Progress Notes (Signed)
aortic duplex completed

## 2014-03-28 ENCOUNTER — Other Ambulatory Visit: Payer: Self-pay | Admitting: *Deleted

## 2014-03-28 ENCOUNTER — Telehealth: Payer: Self-pay | Admitting: Cardiology

## 2014-03-28 DIAGNOSIS — I714 Abdominal aortic aneurysm, without rupture, unspecified: Secondary | ICD-10-CM

## 2014-03-28 NOTE — Telephone Encounter (Signed)
Spoke with patient.

## 2014-03-28 NOTE — Telephone Encounter (Signed)
New problem   Pt want to talk to nurse concerning his carotid and abdominal aorta test he had yesterday. Please call pt.

## 2014-04-02 ENCOUNTER — Other Ambulatory Visit: Payer: Self-pay | Admitting: *Deleted

## 2014-04-02 MED ORDER — ATORVASTATIN CALCIUM 10 MG PO TABS: ORAL_TABLET | ORAL | Status: DC

## 2014-04-03 ENCOUNTER — Ambulatory Visit (INDEPENDENT_AMBULATORY_CARE_PROVIDER_SITE_OTHER)
Admission: RE | Admit: 2014-04-03 | Discharge: 2014-04-03 | Disposition: A | Discharge status: Home / Self Care | Payer: Medicare Other | Source: Ambulatory Visit | Attending: Cardiology | Admitting: Cardiology

## 2014-04-03 DIAGNOSIS — I700 Atherosclerosis of aorta: Secondary | ICD-10-CM | POA: Diagnosis not present

## 2014-04-03 DIAGNOSIS — I714 Abdominal aortic aneurysm, without rupture, unspecified: Secondary | ICD-10-CM

## 2014-04-03 MED ORDER — IOHEXOL 350 MG/ML SOLN
100.0000 mL | Freq: Once | INTRAVENOUS | Status: AC | PRN
  Administered 2014-04-03: 100 mL via INTRAVENOUS

## 2014-04-04 ENCOUNTER — Telehealth: Payer: Self-pay | Admitting: Cardiology

## 2014-04-04 NOTE — Telephone Encounter (Signed)
New Message:  Pt is calling to hear his CT results.Marland Kitchen

## 2014-04-04 NOTE — Telephone Encounter (Signed)
Spoke with patient to review CT results.  Patient states Dr. Aundra Dubin called him last night to report the results.  Patient states he did not report to Dr. Aundra Dubin that he has not been feeling well over the last few weeks.  Patient states he is concerned and would like to know if he can continue to play golf and walk for exercise.  I advised patient that I will send message to Dr. Aundra Dubin for his advice.  Patient states his appointment with Dr. Trula Slade on 5/18 was scheduled due to abnormal carotids.  I advised patient that I will call Dr. Stephens Shire office to see if appointment can be moved up.  I left a message at [facility] for triage nurse to call back regarding this patient.

## 2014-04-05 NOTE — Telephone Encounter (Signed)
Received call from [facility] at [facility], She worked in this patient with Dr. Trula Slade.  He will be seen there on 4/27 at 2:45 pm.  She has been unable to reach the patient so far this am, but will continue trying to reach him today.

## 2014-04-05 NOTE — Telephone Encounter (Signed)
Called patient, he has been made aware of his appointment--just spoke with [facility]. He appreciates that his appointment has been moved up.

## 2014-04-06 ENCOUNTER — Encounter: Payer: Self-pay | Admitting: Surgery

## 2014-04-09 ENCOUNTER — Encounter: Payer: Self-pay | Admitting: Surgery

## 2014-04-09 ENCOUNTER — Other Ambulatory Visit: Payer: Self-pay

## 2014-04-09 ENCOUNTER — Encounter: Payer: Self-pay | Admitting: *Deleted

## 2014-04-09 ENCOUNTER — Ambulatory Visit (INDEPENDENT_AMBULATORY_CARE_PROVIDER_SITE_OTHER): Payer: Medicare Other | Admitting: Surgery

## 2014-04-09 VITALS — BP 134/94 | HR 95 | Ht 70.5 in | Wt 202.3 lb

## 2014-04-09 DIAGNOSIS — I6529 Occlusion and stenosis of unspecified carotid artery: Secondary | ICD-10-CM | POA: Diagnosis not present

## 2014-04-09 DIAGNOSIS — I714 Abdominal aortic aneurysm, without rupture, unspecified: Secondary | ICD-10-CM

## 2014-04-09 NOTE — Progress Notes (Signed)
Patient name: Nathaniel [name]. MRN: [id number] DOB: 1932-08-11 Sex: male   Referred by: Dr. Aundra Dubin  Reason for referral:  Chief Complaint  Patient presents with  . New Evaluation    bilateral carotid stenosis and increase in size of AAA    HISTORY OF PRESENT ILLNESS: This is a very pleasant 80 year old gentleman that I have seen for evaluation of an abdominal aortic aneurysm.  The patient has been followed for many years for a slowly enlarging abdominal aortic aneurysm.  The most recent scan showed a slight enlargement to 4.4 cm.,  And a CT scan was obtained.  This showed a 4.4 cm aneurysm which was very ulcerated.  The patient notes some vague abdominal discomfort as well as frequent urination.  He also reports that his father died of a ruptured abdominal aortic aneurysm.  He has been having significant a difficult time recently following his CT scan, regarding his aneurysm.  The patient has an extensive history of coronary artery disease.  He is undergone CABG and redo CABG.  He is also undergone AVR.  He is very active and plays golf regularly.  His hypercholesterolemia is managed with a statin.  His hypertension is managed with an ACE inhibitor.  He denies symptoms of claudication.  Past Medical History  Diagnosis Date  . CAD (coronary artery disease)   . Atrial fibrillation   . Diabetes mellitus   . Hyperlipemia   . Hx: UTI (urinary tract infection)   . Gout   . Hypertension   . Cataract   . GERD (gastroesophageal reflux disease)   . Peripheral vascular disease   . Atrial fibrillation     Past Surgical History  Procedure Laterality Date  . Appendectomy    . Cardiac valve surgery    . Angioplasty      Unsure if he had stents put in   . Coronary artery bypass graft    . Hernia repair    . Tonsillectomy      History   Social History  . Marital Status: Widowed    Spouse Name: N/A    Number of Children: N/A  . Years of Education: N/A   Occupational History  .  [employer]     Social History Main Topics  . Smoking status: Former Smoker -- 20 years    Types: Cigarettes  . Smokeless tobacco: Never Used  . Alcohol Use: 6.0 oz/week    10 Glasses of wine per week     Comment: 1-2 drinks daily   . Drug Use: No  . Sexual Activity: Not on file   Other Topics Concern  . Not on file   Social History Narrative   1 caffeine drink daily     Family History  Problem Relation Age of Onset  . Colon cancer Neg Hx   . Heart disease Father     before age 70  . AAA (abdominal aortic aneurysm) Father   . Heart disease Mother     Allergies as of 04/09/2014  . (No Known Allergies)    Current Outpatient Prescriptions on File Prior to Visit  Medication Sig Dispense Refill  . ACTOS 30 MG tablet Take 30 mg by mouth daily.       Marland Kitchen allopurinol (ZYLOPRIM) 100 MG tablet Take 100 mg by mouth daily.        Marland Kitchen aspirin 81 MG tablet Take 81 mg by mouth daily.        Marland Kitchen atorvastatin (LIPITOR)  10 MG tablet 1 tablet daily with a 20mg  tablet daily  30 tablet  3  . atorvastatin (LIPITOR) 20 MG tablet Take 20 mg by mouth daily.      Marland Kitchen doxazosin (CARDURA) 1 MG tablet at bedtime. Take 2 in the am and 1 in the pm      . LOVAZA 1 G capsule Take 1 g by mouth 3 (three) times daily. 3 daily      . ramipril (ALTACE) 5 MG capsule TAKE ONE CAPSULE EACH DAY  90 capsule  3  . ZETIA 10 MG tablet Take 10 mg by mouth daily.        No current facility-administered medications on file prior to visit.     REVIEW OF SYSTEMS: Cardiovascular: No chest pain, chest pressure, palpitations, positive for shortness of breath when lying flat and with exertion.  Positive for pain in legs with walking Pulmonary: No productive cough, asthma or wheezing. Neurologic: No weakness, paresthesias, aphasia, or amaurosis.  Positive for dizziness. Hematologic: No bleeding problems or clotting disorders. Musculoskeletal: No joint pain or joint swelling. Gastrointestinal: No blood in stool or  hematemesis Genitourinary: No dysuria or hematuria. Psychiatric:: No history of major depression. Integumentary: No rashes or ulcers. Constitutional: No fever or chills.  PHYSICAL EXAMINATION: General: The patient appears their stated age.  Vital signs are BP 134/94  Pulse 95  Ht 5' 10.5" (1.791 m)  Wt 202 lb 4.8 oz (91.763 kg)  BMI 28.61 kg/m2  SpO2 98% HEENT:  No gross abnormalities Pulmonary: Respirations are non-labored Abdomen: Soft.  Mild discomfort with deep palpation.  Musculoskeletal: There are no major deformities.   Neurologic: No focal weakness or paresthesias are detected, Skin: There are no ulcer or rashes noted. Psychiatric: The patient has normal affect. Cardiovascular: There is a regular rate and rhythm without significant murmur appreciated.  Palpable femoral pulses.  Palpable left pedal pulse faintly palpable right  Diagnostic Studies: I have reviewed his carotid angiogram which shows 60-79% bilateral carotid stenosis.  The patient has a CT angiogram which shows an ulcerated infrarenal abdominal aorta with maximum diameter of 4.4 cm   Assessment:  #1 Abdominal aortic aneurysm #2: Carotid stenosis Plan: #1: I had a lengthy conversation regarding treatment options of this ulcerated infrarenal abdominal aortic aneurysm.  The patient is very apprehensive about this.  It has resurfaced thoughts of his father dying from a ruptured aneurysm.  He is having trouble sleeping at night thinking about this.  He doesn't endorse mild abdominal discomfort that has been going on for approximately one-2 weeks.  I proposed continuing to monitor this or going ahead and having it fixed, despite the relatively low risk of rupture at this size.  Because of the patient's apprehension about rupture, we have collectively decided to proceed with endovascular repair.  We discussed the risks and benefits including the risk of intestinal ischemia, cardiopulmonary complications, lower extremity  ischemia, stroke.  We also discussed the need for future interventions and serial followup.  His operation is been scheduled for this Friday, May 1.  #2: The patient will followup with repeat carotid Doppler studies in 6 months.     Eldridge Abrahams, M.D. [facility] Office: [phone] Pager:  [phone]

## 2014-04-10 ENCOUNTER — Other Ambulatory Visit: Payer: Self-pay

## 2014-04-11 ENCOUNTER — Other Ambulatory Visit: Payer: Self-pay

## 2014-04-11 ENCOUNTER — Inpatient Hospital Stay (HOSPITAL_COMMUNITY)
Admission: RE | Admit: 2014-04-11 | Discharge: 2014-04-11 | Disposition: A | Discharge status: Home / Self Care | Payer: Medicare Other | Source: Ambulatory Visit

## 2014-04-11 NOTE — Pre-Procedure Instructions (Signed)
Nathaniel [name].  04/11/2014   Your procedure is scheduled on:  Friday, May 1 @ 0730 am.  Report to [facility] Admitting ,Main Entrance "A" at 0530 AM.  Call this number if you have problems the morning of surgery: [phone]   Remember:   Do not eat food or drink liquids after midnight.Thursday night   Take these medicines the morning of surgery with A SIP OF WATER: none    Do not wear jewelry.  Do not wear lotions, powders, or perfumes. Do not wear deodorant.  Do not shave 48 hours prior to surgery. Men may shave face and neck.  Do not bring valuables to the hospital.  [facility] is not responsible  for any belongings or valuables.               Contacts, dentures or bridgework may not be worn into surgery.  Leave suitcase in the car. After surgery it may be brought to your room.  For patients admitted to the hospital, discharge time is determined by your   treatment team.               Patients discharged the day of surgery will not be allowed to drive  home.  Name and phone number of your driver:     Special Instructions: [facility] - Preparing for Surgery  Before surgery, you can play an important role.  Because skin is not sterile, your skin needs to be as free of germs as possible.  You can reduce the number of germs on you skin by washing with CHG (chlorahexidine gluconate) soap before surgery.  CHG is an antiseptic cleaner which kills germs and bonds with the skin to continue killing germs even after washing.  Please DO NOT use if you have an allergy to CHG or antibacterial soaps.  If your skin becomes reddened/irritated stop using the CHG and inform your nurse when you arrive at Short Stay.  Do not shave (including legs and underarms) for at least 48 hours prior to the first CHG shower.  You may shave your face.  Please follow these instructions carefully:   1.  Shower with CHG Soap the night before surgery and the   morning of Surgery.  2.  If you choose to wash  your hair, wash your hair first as usual with your   normal shampoo.  3.  After you shampoo, rinse your hair and body thoroughly to remove the  Shampoo.  4.  Use CHG as you would any other liquid soap.  You can apply chg directly   to the skin and wash gently with scrungie or a clean washcloth.  5.  Apply the CHG Soap to your body ONLY FROM THE NECK DOWN.        Do not use on open wounds or open sores.  Avoid contact with your eyes, ears, mouth and genitals (private parts).  Wash genitals (private parts)   with your normal soap.  6.  Wash thoroughly, paying special attention to the area where your surgery  will be performed.  7.  Thoroughly rinse your body with warm water from the neck down.  8.  DO NOT shower/wash with your normal soap after using and rinsing off   the CHG Soap.  9.  Pat yourself dry with a clean towel.            10.  Wear clean pajamas.  11.  Place clean sheets on your bed the night of your first shower and do not  sleep with pets.  Day of Surgery  Do not apply any lotions/deoderants the morning of surgery.  Please wear clean clothes to the hospital/surgery center.     Please read over the following fact sheets that you were given: Pain Booklet, Coughing and Deep Breathing, Blood Transfusion Information and Surgical Site Infection Prevention

## 2014-04-12 ENCOUNTER — Encounter (HOSPITAL_COMMUNITY)
Admission: RE | Admit: 2014-04-12 | Discharge: 2014-04-12 | Disposition: A | Discharge status: Home / Self Care | Payer: Medicare Other | Source: Ambulatory Visit | Attending: Surgery | Admitting: Surgery

## 2014-04-12 ENCOUNTER — Encounter (HOSPITAL_COMMUNITY): Payer: Self-pay | Admitting: Pharmacy Technician

## 2014-04-12 ENCOUNTER — Ambulatory Visit (HOSPITAL_COMMUNITY)
Admission: RE | Admit: 2014-04-12 | Discharge: 2014-04-12 | Disposition: A | Discharge status: Home / Self Care | Payer: Medicare Other | Source: Ambulatory Visit | Attending: Anesthesiology | Admitting: Anesthesiology

## 2014-04-12 ENCOUNTER — Encounter (HOSPITAL_COMMUNITY): Payer: Self-pay

## 2014-04-12 DIAGNOSIS — Z01812 Encounter for preprocedural laboratory examination: Secondary | ICD-10-CM | POA: Insufficient documentation

## 2014-04-12 DIAGNOSIS — Z79899 Other long term (current) drug therapy: Secondary | ICD-10-CM | POA: Diagnosis not present

## 2014-04-12 DIAGNOSIS — I6529 Occlusion and stenosis of unspecified carotid artery: Secondary | ICD-10-CM | POA: Diagnosis not present

## 2014-04-12 DIAGNOSIS — Z8249 Family history of ischemic heart disease and other diseases of the circulatory system: Secondary | ICD-10-CM | POA: Diagnosis not present

## 2014-04-12 DIAGNOSIS — Z01818 Encounter for other preprocedural examination: Secondary | ICD-10-CM

## 2014-04-12 DIAGNOSIS — M109 Gout, unspecified: Secondary | ICD-10-CM | POA: Diagnosis present

## 2014-04-12 DIAGNOSIS — I739 Peripheral vascular disease, unspecified: Secondary | ICD-10-CM | POA: Diagnosis present

## 2014-04-12 DIAGNOSIS — I658 Occlusion and stenosis of other precerebral arteries: Secondary | ICD-10-CM | POA: Diagnosis present

## 2014-04-12 DIAGNOSIS — Z7982 Long term (current) use of aspirin: Secondary | ICD-10-CM | POA: Diagnosis not present

## 2014-04-12 DIAGNOSIS — I779 Disorder of arteries and arterioles, unspecified: Secondary | ICD-10-CM | POA: Diagnosis not present

## 2014-04-12 DIAGNOSIS — E119 Type 2 diabetes mellitus without complications: Secondary | ICD-10-CM | POA: Diagnosis not present

## 2014-04-12 DIAGNOSIS — E785 Hyperlipidemia, unspecified: Secondary | ICD-10-CM | POA: Diagnosis present

## 2014-04-12 DIAGNOSIS — I714 Abdominal aortic aneurysm, without rupture, unspecified: Secondary | ICD-10-CM | POA: Diagnosis not present

## 2014-04-12 DIAGNOSIS — I25810 Atherosclerosis of coronary artery bypass graft(s) without angina pectoris: Secondary | ICD-10-CM | POA: Diagnosis not present

## 2014-04-12 DIAGNOSIS — I4891 Unspecified atrial fibrillation: Secondary | ICD-10-CM | POA: Diagnosis not present

## 2014-04-12 DIAGNOSIS — Z951 Presence of aortocoronary bypass graft: Secondary | ICD-10-CM | POA: Diagnosis not present

## 2014-04-12 DIAGNOSIS — Z952 Presence of prosthetic heart valve: Secondary | ICD-10-CM | POA: Diagnosis not present

## 2014-04-12 DIAGNOSIS — Z87891 Personal history of nicotine dependence: Secondary | ICD-10-CM | POA: Diagnosis not present

## 2014-04-12 DIAGNOSIS — Z9861 Coronary angioplasty status: Secondary | ICD-10-CM | POA: Diagnosis not present

## 2014-04-12 DIAGNOSIS — I10 Essential (primary) hypertension: Secondary | ICD-10-CM

## 2014-04-12 DIAGNOSIS — I2510 Atherosclerotic heart disease of native coronary artery without angina pectoris: Secondary | ICD-10-CM | POA: Diagnosis not present

## 2014-04-12 DIAGNOSIS — K219 Gastro-esophageal reflux disease without esophagitis: Secondary | ICD-10-CM | POA: Diagnosis present

## 2014-04-12 HISTORY — DX: Unspecified abdominal hernia without obstruction or gangrene: K46.9

## 2014-04-12 HISTORY — DX: Shortness of breath: R06.02

## 2014-04-12 LAB — PROTIME-INR
INR: 1.01 (ref 0.00–1.49)
Prothrombin Time: 13.1 seconds (ref 11.6–15.2)

## 2014-04-12 LAB — SURGICAL PCR SCREEN
MRSA, PCR: NEGATIVE
Staphylococcus aureus: POSITIVE — AB

## 2014-04-12 LAB — COMPREHENSIVE METABOLIC PANEL
ALBUMIN: 3.8 g/dL (ref 3.5–5.2)
ALT: 27 U/L (ref 0–53)
AST: 28 U/L (ref 0–37)
Alkaline Phosphatase: 64 U/L (ref 39–117)
BILIRUBIN TOTAL: 1 mg/dL (ref 0.3–1.2)
BUN: 14 mg/dL (ref 6–23)
CO2: 23 meq/L (ref 19–32)
CREATININE: 0.95 mg/dL (ref 0.50–1.35)
Calcium: 9.3 mg/dL (ref 8.4–10.5)
Chloride: 101 mEq/L (ref 96–112)
GFR calc Af Amer: 89 mL/min — ABNORMAL LOW (ref >90)
GFR calc non Af Amer: 77 mL/min — ABNORMAL LOW (ref >90)
Glucose, Bld: 119 mg/dL — ABNORMAL HIGH (ref 70–99)
POTASSIUM: 4.6 meq/L (ref 3.7–5.3)
Sodium: 137 mEq/L (ref 137–147)
Total Protein: 7 g/dL (ref 6.0–8.3)

## 2014-04-12 LAB — BLOOD GAS, ARTERIAL
Acid-Base Excess: 1.3 mmol/L (ref 0.0–2.0)
Bicarbonate: 25 mEq/L — ABNORMAL HIGH (ref 20.0–24.0)
Drawn by: 206361
FIO2: 0.21 %
O2 Saturation: 95.4 %
PCO2 ART: 37.7 mmHg (ref 35.0–45.0)
PO2 ART: 85.9 mmHg (ref 80.0–100.0)
Patient temperature: 98.6
TCO2: 26.2 mmol/L (ref 0–100)
pH, Arterial: 7.438 (ref 7.350–7.450)

## 2014-04-12 LAB — URINALYSIS, ROUTINE W REFLEX MICROSCOPIC
Bilirubin Urine: NEGATIVE
Glucose, UA: NEGATIVE mg/dL
Hgb urine dipstick: NEGATIVE
KETONES UR: NEGATIVE mg/dL
Leukocytes, UA: NEGATIVE
NITRITE: NEGATIVE
PROTEIN: NEGATIVE mg/dL
Specific Gravity, Urine: 1.014 (ref 1.005–1.030)
Urobilinogen, UA: 0.2 mg/dL (ref 0.0–1.0)
pH: 5.5 (ref 5.0–8.0)

## 2014-04-12 LAB — CBC
HCT: 42.1 % (ref 39.0–52.0)
Hemoglobin: 13.9 g/dL (ref 13.0–17.0)
MCH: 30.2 pg (ref 26.0–34.0)
MCHC: 33 g/dL (ref 30.0–36.0)
MCV: 91.3 fL (ref 78.0–100.0)
PLATELETS: 186 10*3/uL (ref 150–400)
RBC: 4.61 MIL/uL (ref 4.22–5.81)
RDW: 13.4 % (ref 11.5–15.5)
WBC: 6.3 10*3/uL (ref 4.0–10.5)

## 2014-04-12 LAB — TYPE AND SCREEN
ABO/RH(D): O POS
Antibody Screen: NEGATIVE

## 2014-04-12 LAB — APTT: aPTT: 36 seconds (ref 24–37)

## 2014-04-12 MED ORDER — CHLORHEXIDINE GLUCONATE 4 % EX LIQD
60.0000 mL | Freq: Once | CUTANEOUS | Status: DC
  Filled 2014-04-12: qty 60

## 2014-04-12 MED ORDER — DEXTROSE 5 % IV SOLN: 1.5000 g | INTRAVENOUS | Status: DC

## 2014-04-12 MED ORDER — SODIUM CHLORIDE 0.9 % IV SOLN
INTRAVENOUS | Status: DC
Start: 2014-04-12 — End: 2014-04-12

## 2014-04-12 MED ORDER — DEXTROSE 5 % IV SOLN
1.5000 g | INTRAVENOUS | Status: AC
  Administered 2014-04-13: 1.5 g via INTRAVENOUS
  Filled 2014-04-12: qty 1.5

## 2014-04-12 MED ORDER — SODIUM CHLORIDE 0.9 % IV SOLN: INTRAVENOUS | Status: DC

## 2014-04-12 NOTE — Progress Notes (Signed)
Cardiologist: dr. Aundra Dubin  Pt. Stated he has an abdominal hernia and wanted to know if Dr. Trula Slade could repair it with the surgery tomorrow. I called the office and spoke to [place]. She stated she will send Dr. Trula Slade a message and he can discuss it with the patient tomorrow.

## 2014-04-12 NOTE — Progress Notes (Signed)
04/12/14 1215  OBSTRUCTIVE SLEEP APNEA  Have you ever been diagnosed with sleep apnea through a sleep study? No  Do you snore loudly (loud enough to be heard through closed doors)?  0  Do you often feel tired, fatigued, or sleepy during the daytime? 1  Has anyone observed you stop breathing during your sleep? 0  Do you have, or are you being treated for high blood pressure? 1  BMI more than 35 kg/m2? 0  Age over 78 years old? 1  Neck circumference greater than 40 cm/16 inches? 1  Gender: 1  Obstructive Sleep Apnea Score 5  Score 4 or greater  Results sent to PCP

## 2014-04-12 NOTE — Progress Notes (Signed)
Anesthesia Chart Review:  Patient is a 80 year old male scheduled for EVAR AAA tomorrow by Dr. Trula Slade. He has a 4.4 cm IFAAA with multiple penetrating ulcers and 2.6 cm left CIA aneurysm. His father died from a ruptured AAA.  Elective repair was recommended.  History includes CAD s/p CABG X 6 (LIMA to LAD, SVG to DIAG, INTERMEDIATE, OM, SVG to PDA, continuation branch) '88 followed by angioplasty of RCA and then redo CABG (SVG to PDA) with AVR (pericardial tissue) on 11/16/06, bilateral carotid artery stenosis (60-79% bilateral ICA stenosis by duplex 03/27/14), former smoker, post-operative afib, GERD, DM2, HTN, HLD, hernia repair X 2, tonsillectomy, appendectomy.  PCP is Dr. Elyse Hsu. Cardiologist is Dr. Aundra Dubin.  Dr. Trula Slade spoke with Dr. Aundra Dubin earlier today about plans for surgery.  Dr. Aundra Dubin did not feel that a preoperative stress test was warranted at this time, but did recommended watching for post-operative a-fib.  EKG on 02/16/14 showed: SR with PACs, rightward axis, incomplete left BBB, ST/T wave abnormality, consider inferior ischemia.   Echo on 10/18/13 showed:  - Left ventricle: The cavity size was normal. Wall thickness was increased in a pattern of mild LVH. Systolic function was normal. The estimated ejection fraction was in the range of 55% to 60%. Wall motion was normal; there were no regional wall motion abnormalities. Doppler parameters are consistent with abnormal left ventricular relaxation (grade 1 diastolic dysfunction). - Aortic valve: A bioprosthesis was present and functioning normally. There was very mild stenosis. - Mitral valve: Calcified annulus. Mildly thickened leaflets. - Left atrium: The atrium was moderately dilated. - Tricuspid valve: Mild regurgitation.  I do not see a stress test or cath since his redo CABG in 2007.  CXR on 04/12/14 showed: No edema or consolidation. There is a degree of underlying emphysematous change.  Preoperative labs noted.  Dr. Aundra Dubin  is aware of plans for surgery.  No further cardiac work-up was recommended.  If no acute changes then I would anticipate that he could proceed as planned.  George [facility]iology Phone [phone] 04/12/2014 2:29 PM

## 2014-04-13 ENCOUNTER — Encounter (HOSPITAL_COMMUNITY)
Admission: RE | Disposition: A | Discharge status: Home / Self Care | Payer: Self-pay | Source: Ambulatory Visit | Attending: Surgery

## 2014-04-13 ENCOUNTER — Inpatient Hospital Stay (HOSPITAL_COMMUNITY)
Admission: RE | Admit: 2014-04-13 | Discharge: 2014-04-14 | DRG: 238 | Disposition: A | Discharge status: Home / Self Care | Payer: Medicare Other | Source: Ambulatory Visit | Attending: Surgery | Admitting: Surgery

## 2014-04-13 ENCOUNTER — Encounter (HOSPITAL_COMMUNITY): Payer: Medicare Other | Admitting: Vascular Surgery

## 2014-04-13 ENCOUNTER — Encounter (HOSPITAL_COMMUNITY): Payer: Self-pay | Admitting: Anesthesiology

## 2014-04-13 ENCOUNTER — Inpatient Hospital Stay (HOSPITAL_COMMUNITY): Payer: Medicare Other | Admitting: Anesthesiology

## 2014-04-13 DIAGNOSIS — I658 Occlusion and stenosis of other precerebral arteries: Secondary | ICD-10-CM | POA: Diagnosis present

## 2014-04-13 DIAGNOSIS — Z952 Presence of prosthetic heart valve: Secondary | ICD-10-CM

## 2014-04-13 DIAGNOSIS — E785 Hyperlipidemia, unspecified: Secondary | ICD-10-CM | POA: Diagnosis present

## 2014-04-13 DIAGNOSIS — I714 Abdominal aortic aneurysm, without rupture, unspecified: Secondary | ICD-10-CM

## 2014-04-13 DIAGNOSIS — I25810 Atherosclerosis of coronary artery bypass graft(s) without angina pectoris: Secondary | ICD-10-CM | POA: Diagnosis not present

## 2014-04-13 DIAGNOSIS — E119 Type 2 diabetes mellitus without complications: Secondary | ICD-10-CM | POA: Diagnosis present

## 2014-04-13 DIAGNOSIS — Z01812 Encounter for preprocedural laboratory examination: Secondary | ICD-10-CM

## 2014-04-13 DIAGNOSIS — I4891 Unspecified atrial fibrillation: Secondary | ICD-10-CM

## 2014-04-13 DIAGNOSIS — Z951 Presence of aortocoronary bypass graft: Secondary | ICD-10-CM

## 2014-04-13 DIAGNOSIS — Z01818 Encounter for other preprocedural examination: Secondary | ICD-10-CM

## 2014-04-13 DIAGNOSIS — I10 Essential (primary) hypertension: Secondary | ICD-10-CM | POA: Diagnosis present

## 2014-04-13 DIAGNOSIS — Z87891 Personal history of nicotine dependence: Secondary | ICD-10-CM

## 2014-04-13 DIAGNOSIS — I6529 Occlusion and stenosis of unspecified carotid artery: Secondary | ICD-10-CM | POA: Diagnosis present

## 2014-04-13 DIAGNOSIS — I2510 Atherosclerotic heart disease of native coronary artery without angina pectoris: Secondary | ICD-10-CM | POA: Diagnosis not present

## 2014-04-13 DIAGNOSIS — Z953 Presence of xenogenic heart valve: Secondary | ICD-10-CM

## 2014-04-13 DIAGNOSIS — I779 Disorder of arteries and arterioles, unspecified: Secondary | ICD-10-CM | POA: Diagnosis not present

## 2014-04-13 DIAGNOSIS — K219 Gastro-esophageal reflux disease without esophagitis: Secondary | ICD-10-CM | POA: Diagnosis present

## 2014-04-13 DIAGNOSIS — Z8249 Family history of ischemic heart disease and other diseases of the circulatory system: Secondary | ICD-10-CM

## 2014-04-13 DIAGNOSIS — Z7982 Long term (current) use of aspirin: Secondary | ICD-10-CM

## 2014-04-13 DIAGNOSIS — Z9861 Coronary angioplasty status: Secondary | ICD-10-CM

## 2014-04-13 DIAGNOSIS — I739 Peripheral vascular disease, unspecified: Secondary | ICD-10-CM | POA: Diagnosis present

## 2014-04-13 DIAGNOSIS — M109 Gout, unspecified: Secondary | ICD-10-CM | POA: Diagnosis present

## 2014-04-13 DIAGNOSIS — Z79899 Other long term (current) drug therapy: Secondary | ICD-10-CM

## 2014-04-13 HISTORY — PX: ABDOMINAL AORTIC ENDOVASCULAR STENT GRAFT: SHX5707

## 2014-04-13 LAB — CBC
HCT: 37.1 % — ABNORMAL LOW (ref 39.0–52.0)
HEMOGLOBIN: 12.3 g/dL — AB (ref 13.0–17.0)
MCH: 30.1 pg (ref 26.0–34.0)
MCHC: 33.2 g/dL (ref 30.0–36.0)
MCV: 90.9 fL (ref 78.0–100.0)
Platelets: 169 10*3/uL (ref 150–400)
RBC: 4.08 MIL/uL — AB (ref 4.22–5.81)
RDW: 13.4 % (ref 11.5–15.5)
WBC: 5.8 10*3/uL (ref 4.0–10.5)

## 2014-04-13 LAB — BASIC METABOLIC PANEL
BUN: 17 mg/dL (ref 6–23)
CO2: 25 meq/L (ref 19–32)
Calcium: 8.6 mg/dL (ref 8.4–10.5)
Chloride: 104 mEq/L (ref 96–112)
Creatinine, Ser: 0.97 mg/dL (ref 0.50–1.35)
GFR calc Af Amer: 88 mL/min — ABNORMAL LOW (ref >90)
GFR, EST NON AFRICAN AMERICAN: 76 mL/min — AB (ref >90)
GLUCOSE: 119 mg/dL — AB (ref 70–99)
POTASSIUM: 4.4 meq/L (ref 3.7–5.3)
Sodium: 139 mEq/L (ref 137–147)

## 2014-04-13 LAB — GLUCOSE, CAPILLARY
GLUCOSE-CAPILLARY: 130 mg/dL — AB (ref 70–99)
GLUCOSE-CAPILLARY: 117 mg/dL — AB (ref 70–99)
Glucose-Capillary: 108 mg/dL — ABNORMAL HIGH (ref 70–99)
Glucose-Capillary: 108 mg/dL — ABNORMAL HIGH (ref 70–99)

## 2014-04-13 LAB — MAGNESIUM: MAGNESIUM: 1.8 mg/dL (ref 1.5–2.5)

## 2014-04-13 LAB — PROTIME-INR
INR: 1.23 (ref 0.00–1.49)
Prothrombin Time: 15.2 seconds (ref 11.6–15.2)

## 2014-04-13 LAB — APTT: APTT: 34 s (ref 24–37)

## 2014-04-13 SURGERY — INSERTION, ENDOVASCULAR STENT GRAFT, AORTA, ABDOMINAL
Anesthesia: Monitor Anesthesia Care

## 2014-04-13 MED ORDER — PIOGLITAZONE HCL 30 MG PO TABS
30.0000 mg | ORAL_TABLET | Freq: Every day | ORAL | Status: DC
  Administered 2014-04-13: 30 mg via ORAL
  Filled 2014-04-13 (×2): qty 1

## 2014-04-13 MED ORDER — PHENOL 1.4 % MT LIQD: 1.0000 | OROMUCOSAL | Status: DC | PRN

## 2014-04-13 MED ORDER — SODIUM CHLORIDE 0.9 % IV SOLN: 500.0000 mL | Freq: Once | INTRAVENOUS | Status: AC | PRN

## 2014-04-13 MED ORDER — ESMOLOL HCL 10 MG/ML IV SOLN
INTRAVENOUS | Status: DC | PRN
  Administered 2014-04-13: 20 mg via INTRAVENOUS

## 2014-04-13 MED ORDER — POTASSIUM CHLORIDE CRYS ER 20 MEQ PO TBCR: 20.0000 meq | EXTENDED_RELEASE_TABLET | Freq: Every day | ORAL | Status: DC | PRN

## 2014-04-13 MED ORDER — DILTIAZEM HCL 100 MG IV SOLR
5.0000 mg/h | INTRAVENOUS | Status: DC
  Filled 2014-04-13: qty 100

## 2014-04-13 MED ORDER — HYDRALAZINE HCL 20 MG/ML IJ SOLN: 10.0000 mg | INTRAMUSCULAR | Status: DC | PRN

## 2014-04-13 MED ORDER — LABETALOL HCL 5 MG/ML IV SOLN: 10.0000 mg | INTRAVENOUS | Status: DC | PRN

## 2014-04-13 MED ORDER — PANTOPRAZOLE SODIUM 40 MG PO TBEC
40.0000 mg | DELAYED_RELEASE_TABLET | Freq: Every day | ORAL | Status: DC
  Administered 2014-04-13: 40 mg via ORAL
  Filled 2014-04-13: qty 1

## 2014-04-13 MED ORDER — FENTANYL CITRATE 0.05 MG/ML IJ SOLN
INTRAMUSCULAR | Status: DC | PRN
  Administered 2014-04-13 (×2): 25 ug via INTRAVENOUS
  Administered 2014-04-13: 100 ug via INTRAVENOUS
  Administered 2014-04-13: 25 ug via INTRAVENOUS
  Administered 2014-04-13: 50 ug via INTRAVENOUS
  Administered 2014-04-13: 25 ug via INTRAVENOUS

## 2014-04-13 MED ORDER — DOPAMINE-DEXTROSE 3.2-5 MG/ML-% IV SOLN: 3.0000 ug/kg/min | INTRAVENOUS | Status: DC

## 2014-04-13 MED ORDER — MUPIROCIN 2 % EX OINT
TOPICAL_OINTMENT | Freq: Two times a day (BID) | CUTANEOUS | Status: DC
  Administered 2014-04-13: 22:00:00 via NASAL
  Filled 2014-04-13 (×2): qty 22

## 2014-04-13 MED ORDER — DOXAZOSIN MESYLATE 2 MG PO TABS
2.0000 mg | ORAL_TABLET | Freq: Every day | ORAL | Status: DC
  Filled 2014-04-13: qty 1

## 2014-04-13 MED ORDER — SODIUM CHLORIDE 0.9 % IR SOLN
Status: DC | PRN
  Administered 2014-04-13: 09:00:00

## 2014-04-13 MED ORDER — HEPARIN SODIUM (PORCINE) 1000 UNIT/ML IJ SOLN
INTRAMUSCULAR | Status: AC
  Filled 2014-04-13: qty 1

## 2014-04-13 MED ORDER — FENTANYL CITRATE 0.05 MG/ML IJ SOLN: 25.0000 ug | INTRAMUSCULAR | Status: DC | PRN

## 2014-04-13 MED ORDER — OXYCODONE HCL 5 MG PO TABS: 5.0000 mg | ORAL_TABLET | ORAL | Status: DC | PRN

## 2014-04-13 MED ORDER — ATORVASTATIN CALCIUM 20 MG PO TABS
20.0000 mg | ORAL_TABLET | Freq: Every day | ORAL | Status: DC
  Administered 2014-04-13: 20 mg via ORAL
  Filled 2014-04-13 (×2): qty 1

## 2014-04-13 MED ORDER — LIDOCAINE HCL (CARDIAC) 20 MG/ML IV SOLN
INTRAVENOUS | Status: DC | PRN
  Administered 2014-04-13: 100 mg via INTRAVENOUS

## 2014-04-13 MED ORDER — HEPARIN SODIUM (PORCINE) 1000 UNIT/ML IJ SOLN
INTRAMUSCULAR | Status: DC | PRN
  Administered 2014-04-13: 1000 [IU] via INTRAVENOUS
  Administered 2014-04-13: 7000 [IU] via INTRAVENOUS

## 2014-04-13 MED ORDER — MORPHINE SULFATE 2 MG/ML IJ SOLN
2.0000 mg | INTRAMUSCULAR | Status: DC | PRN
  Administered 2014-04-13: 2 mg via INTRAVENOUS
  Filled 2014-04-13: qty 1

## 2014-04-13 MED ORDER — MAGNESIUM SULFATE 40 MG/ML IJ SOLN: 2.0000 g | Freq: Every day | INTRAMUSCULAR | Status: DC | PRN

## 2014-04-13 MED ORDER — CO Q 10 100 MG PO CAPS: 1.0000 | ORAL_CAPSULE | Freq: Every day | ORAL | Status: DC

## 2014-04-13 MED ORDER — GUAIFENESIN-DM 100-10 MG/5ML PO SYRP: 15.0000 mL | ORAL_SOLUTION | ORAL | Status: DC | PRN

## 2014-04-13 MED ORDER — EZETIMIBE 10 MG PO TABS
10.0000 mg | ORAL_TABLET | Freq: Every day | ORAL | Status: DC
  Administered 2014-04-13: 10 mg via ORAL
  Filled 2014-04-13 (×2): qty 1

## 2014-04-13 MED ORDER — DOXAZOSIN MESYLATE 1 MG PO TABS: 1.0000 mg | ORAL_TABLET | Freq: Two times a day (BID) | ORAL | Status: DC

## 2014-04-13 MED ORDER — PHENYLEPHRINE HCL 10 MG/ML IJ SOLN
INTRAMUSCULAR | Status: AC
  Filled 2014-04-13: qty 1

## 2014-04-13 MED ORDER — METOPROLOL TARTRATE 1 MG/ML IV SOLN: 2.0000 mg | INTRAVENOUS | Status: DC | PRN

## 2014-04-13 MED ORDER — LIDOCAINE-EPINEPHRINE (PF) 1 %-1:200000 IJ SOLN
INTRAMUSCULAR | Status: DC | PRN
  Administered 2014-04-13: 21 mL

## 2014-04-13 MED ORDER — PROTAMINE SULFATE 10 MG/ML IV SOLN
INTRAVENOUS | Status: DC | PRN
  Administered 2014-04-13 (×2): 15 mg via INTRAVENOUS
  Administered 2014-04-13 (×2): 10 mg via INTRAVENOUS

## 2014-04-13 MED ORDER — GLUCOSAMINE-CHONDROITIN 500-400 MG PO TABS: 1.0000 | ORAL_TABLET | Freq: Every day | ORAL | Status: DC

## 2014-04-13 MED ORDER — ENOXAPARIN SODIUM 40 MG/0.4ML ~~LOC~~ SOLN
40.0000 mg | SUBCUTANEOUS | Status: DC
  Filled 2014-04-13: qty 0.4

## 2014-04-13 MED ORDER — ACETAMINOPHEN 325 MG PO TABS: 325.0000 mg | ORAL_TABLET | ORAL | Status: DC | PRN

## 2014-04-13 MED ORDER — LACTATED RINGERS IV SOLN
INTRAVENOUS | Status: DC | PRN
  Administered 2014-04-13: 07:00:00 via INTRAVENOUS

## 2014-04-13 MED ORDER — ENOXAPARIN SODIUM 40 MG/0.4ML ~~LOC~~ SOLN
40.0000 mg | SUBCUTANEOUS | Status: DC
  Administered 2014-04-13: 40 mg via SUBCUTANEOUS
  Filled 2014-04-13 (×2): qty 0.4

## 2014-04-13 MED ORDER — MIDAZOLAM HCL 2 MG/2ML IJ SOLN
INTRAMUSCULAR | Status: AC
  Filled 2014-04-13: qty 2

## 2014-04-13 MED ORDER — OCUVITE PO TABS
1.0000 | ORAL_TABLET | Freq: Every day | ORAL | Status: DC
Start: 2014-04-13 — End: 2014-04-14
  Filled 2014-04-13 (×2): qty 1

## 2014-04-13 MED ORDER — SODIUM CHLORIDE 0.9 % IV SOLN
INTRAVENOUS | Status: DC
  Administered 2014-04-13: 75 mL/h via INTRAVENOUS

## 2014-04-13 MED ORDER — FENTANYL CITRATE 0.05 MG/ML IJ SOLN
INTRAMUSCULAR | Status: AC
  Filled 2014-04-13: qty 5

## 2014-04-13 MED ORDER — DOXAZOSIN MESYLATE 1 MG PO TABS
1.0000 mg | ORAL_TABLET | Freq: Every day | ORAL | Status: DC
  Administered 2014-04-13: 1 mg via ORAL
  Filled 2014-04-13 (×2): qty 1

## 2014-04-13 MED ORDER — ASPIRIN 81 MG PO TABS: 81.0000 mg | ORAL_TABLET | Freq: Every day | ORAL | Status: DC

## 2014-04-13 MED ORDER — PROTAMINE SULFATE 10 MG/ML IV SOLN
INTRAVENOUS | Status: AC
  Filled 2014-04-13: qty 10

## 2014-04-13 MED ORDER — OMEGA-3-ACID ETHYL ESTERS 1 G PO CAPS
1.0000 g | ORAL_CAPSULE | Freq: Three times a day (TID) | ORAL | Status: DC
  Filled 2014-04-13 (×5): qty 1

## 2014-04-13 MED ORDER — DIGOXIN 250 MCG PO TABS
0.2500 mg | ORAL_TABLET | Freq: Every day | ORAL | Status: DC
  Administered 2014-04-13: 0.25 mg via ORAL
  Filled 2014-04-13 (×2): qty 1

## 2014-04-13 MED ORDER — DEXTROSE 5 % IV SOLN
1.5000 g | Freq: Two times a day (BID) | INTRAVENOUS | Status: AC
  Administered 2014-04-13 – 2014-04-14 (×2): 1.5 g via INTRAVENOUS
  Filled 2014-04-13 (×2): qty 1.5

## 2014-04-13 MED ORDER — METOPROLOL SUCCINATE ER 25 MG PO TB24
25.0000 mg | ORAL_TABLET | Freq: Two times a day (BID) | ORAL | Status: DC
  Administered 2014-04-13 – 2014-04-14 (×2): 25 mg via ORAL
  Filled 2014-04-13 (×4): qty 1

## 2014-04-13 MED ORDER — PROPOFOL INFUSION 10 MG/ML OPTIME
INTRAVENOUS | Status: DC | PRN
  Administered 2014-04-13: 100 ug/kg/min via INTRAVENOUS

## 2014-04-13 MED ORDER — 0.9 % SODIUM CHLORIDE (POUR BTL) OPTIME
TOPICAL | Status: DC | PRN
  Administered 2014-04-13: 1000 mL

## 2014-04-13 MED ORDER — PROPOFOL 10 MG/ML IV BOLUS
INTRAVENOUS | Status: DC | PRN
  Administered 2014-04-13: 20 mg via INTRAVENOUS
  Administered 2014-04-13: 30 mg via INTRAVENOUS
  Administered 2014-04-13: 20 mg via INTRAVENOUS

## 2014-04-13 MED ORDER — PROPOFOL 10 MG/ML IV BOLUS
INTRAVENOUS | Status: AC
  Filled 2014-04-13: qty 20

## 2014-04-13 MED ORDER — IODIXANOL 320 MG/ML IV SOLN
INTRAVENOUS | Status: DC | PRN
  Administered 2014-04-13: 63.13 mL via INTRA_ARTERIAL

## 2014-04-13 MED ORDER — ALLOPURINOL 100 MG PO TABS
100.0000 mg | ORAL_TABLET | Freq: Every day | ORAL | Status: DC
  Filled 2014-04-13 (×2): qty 1

## 2014-04-13 MED ORDER — ACETAMINOPHEN 650 MG RE SUPP: 325.0000 mg | RECTAL | Status: DC | PRN

## 2014-04-13 MED ORDER — ALUM & MAG HYDROXIDE-SIMETH 200-200-20 MG/5ML PO SUSP: 15.0000 mL | ORAL | Status: DC | PRN

## 2014-04-13 MED ORDER — ONDANSETRON HCL 4 MG/2ML IJ SOLN: 4.0000 mg | Freq: Once | INTRAMUSCULAR | Status: DC | PRN

## 2014-04-13 MED ORDER — DOCUSATE SODIUM 100 MG PO CAPS: 100.0000 mg | ORAL_CAPSULE | Freq: Every day | ORAL | Status: DC

## 2014-04-13 MED ORDER — ASPIRIN 81 MG PO CHEW
81.0000 mg | CHEWABLE_TABLET | Freq: Every day | ORAL | Status: DC
  Administered 2014-04-13: 81 mg via ORAL
  Filled 2014-04-13: qty 1

## 2014-04-13 MED ORDER — RAMIPRIL 5 MG PO CAPS
5.0000 mg | ORAL_CAPSULE | Freq: Every day | ORAL | Status: DC
  Administered 2014-04-13: 5 mg via ORAL
  Filled 2014-04-13 (×2): qty 1

## 2014-04-13 MED ORDER — PHENYLEPHRINE HCL 10 MG/ML IJ SOLN
10.0000 mg | INTRAVENOUS | Status: DC | PRN
  Administered 2014-04-13: 15 ug/min via INTRAVENOUS

## 2014-04-13 MED ORDER — ACETAMINOPHEN 160 MG/5ML PO SOLN
325.0000 mg | ORAL | Status: DC | PRN
  Filled 2014-04-13: qty 20.3

## 2014-04-13 MED ORDER — PHENYLEPHRINE HCL 10 MG/ML IJ SOLN
INTRAMUSCULAR | Status: DC | PRN
  Administered 2014-04-13 (×5): 80 ug via INTRAVENOUS

## 2014-04-13 MED ORDER — ONDANSETRON HCL 4 MG/2ML IJ SOLN
4.0000 mg | Freq: Four times a day (QID) | INTRAMUSCULAR | Status: DC | PRN
  Administered 2014-04-13: 4 mg via INTRAVENOUS
  Filled 2014-04-13: qty 2

## 2014-04-13 SURGICAL SUPPLY — 87 items
ADH SKN CLS APL DERMABOND .7 (GAUZE/BANDAGES/DRESSINGS) ×2
BAG BANDED W/RUBBER/TAPE 36X54 (MISCELLANEOUS) ×3 IMPLANT
BAG EQP BAND 135X91 W/RBR TAPE (MISCELLANEOUS) ×1
BAG SNAP BAND KOVER 36X36 (MISCELLANEOUS) ×9 IMPLANT
BLADE 10 SAFETY STRL DISP (BLADE) ×1 IMPLANT
BLADE SURG CLIPPER 3M 9600 (MISCELLANEOUS) ×3 IMPLANT
BLADE SURG ROTATE 9660 (MISCELLANEOUS) ×2 IMPLANT
CANISTER SUCTION 2500CC (MISCELLANEOUS) ×3 IMPLANT
CATH ACCU-VU SIZ PIG 5F 70CM (CATHETERS) ×2 IMPLANT
CATH BEACON 5.038 65CM KMP-01 (CATHETERS) ×2 IMPLANT
CATH OMNI FLUSH .035X70CM (CATHETERS) ×2 IMPLANT
CLIP TI MEDIUM 24 (CLIP) IMPLANT
CLIP TI WIDE RED SMALL 24 (CLIP) IMPLANT
COVER DOME SNAP 22 D (MISCELLANEOUS) ×3 IMPLANT
COVER MAYO STAND STRL (DRAPES) ×3 IMPLANT
COVER PROBE W GEL 5X96 (DRAPES) ×3 IMPLANT
COVER SURGICAL LIGHT HANDLE (MISCELLANEOUS) ×3 IMPLANT
DERMABOND ADVANCED (GAUZE/BANDAGES/DRESSINGS) ×4
DERMABOND ADVANCED .7 DNX12 (GAUZE/BANDAGES/DRESSINGS) ×1 IMPLANT
DEVICE CLOSURE PERCLS PRGLD 6F (VASCULAR PRODUCTS) IMPLANT
DRAPE TABLE COVER HEAVY DUTY (DRAPES) ×3 IMPLANT
DRESSING OPSITE X SMALL 2X3 (GAUZE/BANDAGES/DRESSINGS) ×6 IMPLANT
DRYSEAL FLEXSHEATH 12FR 33CM (SHEATH) ×2
DRYSEAL FLEXSHEATH 16FR 33CM (SHEATH) ×2
ELECT REM PT RETURN 9FT ADLT (ELECTROSURGICAL) ×6
ELECTRODE REM PT RTRN 9FT ADLT (ELECTROSURGICAL) ×2 IMPLANT
EXCLUDER TNK 23X14.5MMX12CM (Endovascular Graft) IMPLANT
EXCLUDER TRUNK 23X14.5MMX12CM (Endovascular Graft) ×3 IMPLANT
GAUZE SPONGE 2X2 8PLY STRL LF (GAUZE/BANDAGES/DRESSINGS) ×2 IMPLANT
GLOVE BIOGEL PI IND STRL 7.0 (GLOVE) IMPLANT
GLOVE BIOGEL PI IND STRL 7.5 (GLOVE) ×1 IMPLANT
GLOVE BIOGEL PI INDICATOR 7.0 (GLOVE) ×2
GLOVE BIOGEL PI INDICATOR 7.5 (GLOVE) ×2
GLOVE ECLIPSE 7.5 STRL STRAW (GLOVE) ×2 IMPLANT
GLOVE SS BIOGEL STRL SZ 7 (GLOVE) IMPLANT
GLOVE SS BIOGEL STRL SZ 7.5 (GLOVE) IMPLANT
GLOVE SUPERSENSE BIOGEL SZ 7 (GLOVE) ×2
GLOVE SUPERSENSE BIOGEL SZ 7.5 (GLOVE) ×2
GLOVE SURG SS PI 7.5 STRL IVOR (GLOVE) ×3 IMPLANT
GOWN STRL REUS W/ TWL LRG LVL3 (GOWN DISPOSABLE) ×2 IMPLANT
GOWN STRL REUS W/ TWL XL LVL3 (GOWN DISPOSABLE) ×1 IMPLANT
GOWN STRL REUS W/TWL LRG LVL3 (GOWN DISPOSABLE) ×12
GOWN STRL REUS W/TWL XL LVL3 (GOWN DISPOSABLE) ×3
GRAFT BALLN CATH 65CM (STENTS) IMPLANT
GRAFT EXCLUDER ILIAC (Endovascular Graft) ×2 IMPLANT
GRAFT EXCLUDER LEG (Endovascular Graft) ×2 IMPLANT
HEMOSTAT SNOW SURGICEL 2X4 (HEMOSTASIS) IMPLANT
KIT BASIN OR (CUSTOM PROCEDURE TRAY) ×3 IMPLANT
KIT ROOM TURNOVER OR (KITS) ×3 IMPLANT
NDL HYPO 25GX1X1/2 BEV (NEEDLE) IMPLANT
NDL PERC 18GX7CM (NEEDLE) ×1 IMPLANT
NEEDLE HYPO 25GX1X1/2 BEV (NEEDLE) ×3 IMPLANT
NEEDLE PERC 18GX7CM (NEEDLE) ×3 IMPLANT
NS IRRIG 1000ML POUR BTL (IV SOLUTION) ×3 IMPLANT
PACK AORTA (CUSTOM PROCEDURE TRAY) ×3 IMPLANT
PAD ARMBOARD 7.5X6 YLW CONV (MISCELLANEOUS) ×6 IMPLANT
PENCIL BUTTON HOLSTER BLD 10FT (ELECTRODE) IMPLANT
PERCLOSE PROGLIDE 6F (VASCULAR PRODUCTS) ×12
PROTECTION STATION PRESSURIZED (MISCELLANEOUS) ×3
SHEATH AVANTI 11CM 8FR (MISCELLANEOUS) ×2 IMPLANT
SHEATH BRITE TIP 8FR 23CM (MISCELLANEOUS) ×2 IMPLANT
SHEATH DRYSEAL FLEX 12FR 33CM (SHEATH) IMPLANT
SHEATH DRYSEAL FLEX 16FR 33CM (SHEATH) IMPLANT
SPONGE GAUZE 2X2 STER 10/PKG (GAUZE/BANDAGES/DRESSINGS) ×4
STATION PROTECTION PRESSURIZED (MISCELLANEOUS) IMPLANT
STENT GRAFT BALLN CATH 65CM (STENTS) ×2
STOPCOCK MORSE 400PSI 3WAY (MISCELLANEOUS) ×3 IMPLANT
SUT ETHILON 3 0 PS 1 (SUTURE) IMPLANT
SUT PROLENE 5 0 C 1 24 (SUTURE) IMPLANT
SUT VIC AB 2-0 CT1 27 (SUTURE)
SUT VIC AB 2-0 CT1 TAPERPNT 27 (SUTURE) IMPLANT
SUT VIC AB 3-0 SH 27 (SUTURE)
SUT VIC AB 3-0 SH 27X BRD (SUTURE) IMPLANT
SUT VICRYL 4-0 PS2 18IN ABS (SUTURE) ×6 IMPLANT
SYR 20CC LL (SYRINGE) ×6 IMPLANT
SYR 30ML LL (SYRINGE) IMPLANT
SYR 5ML LL (SYRINGE) IMPLANT
SYR CONTROL 10ML LL (SYRINGE) ×2 IMPLANT
SYR MEDRAD MARK V 150ML (SYRINGE) ×3 IMPLANT
SYRINGE 10CC LL (SYRINGE) ×9 IMPLANT
TOWEL OR 17X24 6PK STRL BLUE (TOWEL DISPOSABLE) ×6 IMPLANT
TOWEL OR 17X26 10 PK STRL BLUE (TOWEL DISPOSABLE) ×6 IMPLANT
TRAY FOLEY CATH 16FRSI W/METER (SET/KITS/TRAYS/PACK) ×3 IMPLANT
TUBING HIGH PRESSURE 120CM (CONNECTOR) ×3 IMPLANT
VISAPAQUE 320MG/ML (150ML) ×2 IMPLANT
WIRE AMPLATZ SS-J .035X180CM (WIRE) ×4 IMPLANT
WIRE BENTSON .035X145CM (WIRE) ×6 IMPLANT

## 2014-04-13 NOTE — Progress Notes (Signed)
Lunch relief by MA Hayzen Lorenson RN 

## 2014-04-13 NOTE — Transfer of Care (Signed)
Immediate Anesthesia Transfer of Care Note  Patient: Nathaniel [name].  Procedure(s) Performed: Procedure(s): ABDOMINAL AORTIC ENDOVASCULAR STENT GRAFT (N/A)  Patient Location: PACU  Anesthesia Type:MAC  Level of Consciousness: awake, alert  and oriented  Airway & Oxygen Therapy: Patient Spontanous Breathing and Patient connected to nasal cannula oxygen  Post-op Assessment: Report given to PACU RN  Post vital signs: Reviewed and stable  Complications: No apparent anesthesia complications

## 2014-04-13 NOTE — Anesthesia Preprocedure Evaluation (Addendum)
Anesthesia Evaluation  Patient identified by MRN, date of birth, ID band Patient awake    Reviewed: Allergy & Precautions, H&P , NPO status , Patient's Chart, lab work & pertinent test results  History of Anesthesia Complications Negative for: history of anesthetic complications  Airway Mallampati: II TM Distance: >3 FB Neck ROM: Full    Dental  (+) Teeth Intact, Dental Advisory Given   Pulmonary shortness of breath, at rest and lying, neg sleep apnea, neg COPDneg recent URI, former smoker,  breath sounds clear to auscultation        Cardiovascular hypertension, Pt. on medications - angina+ CAD, + Cardiac Stents, + CABG and + Peripheral Vascular Disease - CHF + dysrhythmias Atrial Fibrillation + Valvular Problems/Murmurs Rhythm:Irregular Rate:Tachycardia     Neuro/Psych Left ica aneurysm  negative psych ROS   GI/Hepatic Neg liver ROS, GERD-  ,  Endo/Other  diabetes, Type 2  Renal/GU negative Renal ROS     Musculoskeletal negative musculoskeletal ROS (+)   Abdominal   Peds  Hematology   Anesthesia Other Findings Hx CABG with AVR  Reproductive/Obstetrics                          Anesthesia Physical Anesthesia Plan  ASA: III  Anesthesia Plan: MAC   Post-op Pain Management:    Induction: Intravenous  Airway Management Planned: Simple Face Mask and Natural Airway  Additional Equipment: Arterial line  Intra-op Plan:   Post-operative Plan:   Informed Consent: I have reviewed the patients History and Physical, chart, labs and discussed the procedure including the risks, benefits and alternatives for the proposed anesthesia with the patient or authorized representative who has indicated his/her understanding and acceptance.   Dental advisory given  Plan Discussed with: CRNA, Surgeon and Anesthesiologist  Anesthesia Plan Comments:        Anesthesia Quick Evaluation

## 2014-04-13 NOTE — Op Note (Signed)
Patient name: Paxten Appelt. MRN: [id number] DOB: May 29, 1932 Sex: male  04/13/2014 Pre-operative Diagnosis: Abdominal aortic aneurysm Post-operative diagnosis:  Same Surgeon:  Serafina Mitchell Assistants:  Mamie Nick Procedure:   #1: Bilateral ultrasound-guided common femoral artery access   #2: Endovascular repair of abdominal aortic aneurysm    #3: Distal extension x1   #4: Catheter in aorta x2   #5: Abdominal aortogram Anesthesia:  Local and MAC Blood Loss:  See anesthesia record Specimens:  None  Findings:  Complete exclusion.  Scar tissue within right groin Devices used: Main body is a Dealer, primary left 23 x 14 x 12.  Ipsilateral left distal extension is a Gore 14 x 7.  Contralateral right is a Gore 14 x 10  Indications:  This is an 80 year old gentleman who has been followed chronically for abdominal aortic aneurysm.  A recent CT scan showed an increase in size as well as multiple pseudoaneurysms.  The patient has been having vague abdominal complaints.  He is also very apprehensive given that his bother passed away from a ruptured aneurysm.  After a well-informed discussion, we have decided to proceed with endovascular repair.  Procedure:  The patient was identified in the holding area and taken to [place] [address]  The patient was then placed supine on the table. MAC anesthesia was administered.  The patient was prepped and draped in the usual sterile fashion.  A time out was called and antibiotics were administered.  Ultrasound was used to evaluate bilateral common femoral arteries.  They were widely patent with mild calcification.  Digital ultrasound images were acquired.  One percent lidocaine was used for local anesthesia.  A #11 blade was used to make a nick in the right groin.  Using an 18-gauge needle, the right common femoral artery was cannulated under ultrasound guidance.  I was unable to get a 7 [place] dilator through the arteriotomy.  I did manage to get an 8  [place] dilator through.  I placed a Kumpe catheter and at this point to navigate the Bentson wire into the aorta.  Next, with some difficulty, pro-glide devices were deployed in the 11:00 and 1:00 position for pre-closure.  An 8 French sheath was then placed.  Next, after making a skin nick with an 11 blade, the left common femoral artery was cannulated under ultrasound guidance.  I again had to use a Kumpe catheter to navigate the wire to the aorta.  The subcutaneous tract was dilated with an 8 [place] dilator.  A pro-glide devices were placed at the 11:00 and 1:00 position for pre-closure.  An 8 French sheath was then placed.  The patient was fully heparinized.  I exchanged out for a Amplatz superstiff wire on the right side.  A 16 French sheath was then inserted into the aorta.  A abdominal aortogram was then performed.  I selected a Gore Excluder 23 x 14 x 12 to be the primary device.  This was advanced up the right side and positioned properly.  A repeat arteriogram was then performed confirming good position of the graft.  Was then deployed down to the contralateral gate.  Next, the gate was cannulated with a Kumpe catheter and a Benson wire.  I had a very difficult time getting the proper rotation of the Omni flush catheter as well as a pigtail catheter within the main body.  I therefore elected to place an Amplatz superstiff wire and then advanced the 12  French sheath up to the gate.  I then used a q.-50 balloon and inflated the balloon within the gate.  This confirmed that I had successfully cannulated the gate.  Next, the images to take her was rotated to a left anterior oblique.  A retrograde injection through the right groin was performed.  Dislocated the right hypogastric artery.  A contralateral limb, a Gore 14 x 10 was selected and then advanced through the sheath and deployed landing at the level of the right hypogastric artery.  I then deployed the remaining portion of the ipsilateral limb.  The  image detected was rotated to a right anterior oblique position a retrograde injection was performed locating the left hypogastric artery.  I selected a 14 x 7 Gore Excluder iliac limb for a distal extension.  This was deployed landing right at the takeoff of the hypogastric artery.  I then proceeded to perform molding of the proximal and distal attachment sites as well as device overlap.  The q. 50 balloon was able to open the right limb which had been paged by the aortic calcification.  Completion angiogram was then performed.  This showed preservation of bilateral renal as well as bilateral hypogastric arteries.  The device was in excellent position.  There was no evidence of a type I or type III leak.  The stiff wires were then exchanged out for [facility] wires.  The pro-glide devices were then secured for successful closure of the arteriotomy site.  50 mg of protamine were administered.  The incisions were closed with 4-0 Vicryl and Dermabond.  The patient had Doppler signals in bilateral dorsalis pedis and posterior tibial arteries at the end of the procedure.   Disposition:  To PACU in stable condition.   Theotis Burrow, M.D. [facility] Office: [phone] Pager:  [phone]

## 2014-04-13 NOTE — Interval H&P Note (Signed)
History and Physical Interval Note:  04/13/2014 6:47 AM  [name] [name].  has presented today for surgery, with the diagnosis of AAA  The various methods of treatment have been discussed with the patient and family. After consideration of risks, benefits and other options for treatment, the patient has consented to  Procedure(s): ABDOMINAL AORTIC ENDOVASCULAR STENT GRAFT (N/A) as a surgical intervention .  The patient's history has been reviewed, patient examined, no change in status, stable for surgery.  I have reviewed the patient's chart and labs.  Questions were answered to the patient's satisfaction.     Nathaniel [name]

## 2014-04-13 NOTE — Op Note (Signed)
OPERATIVE REPORT  Date of Surgery: 04/13/2014  Surgeon: Dr. Annamarie Major and Dr. Tinnie Gens    Pre-op Diagnosis: Abdominal aortic aneurysm  Post-op Diagnosis: Same  Procedure: Procedure(s): ABDOMINAL AORTIC ENDOVASCULAR STENT GRAFT repair using aortobiiliac Gore-Excluder-C3-stent graft via bilateral percutaneous approach and bilateral closure using pro-glide suture device Anesthesia: MAC  EBL: And 50 cc  Complications: None  Procedure Details: The patient was taken to the operating room placed in supine position at which time satisfactory prepping and draping of the abdomen and groins were was performed. The patient was under sedation with local anesthesia-MAC. Both femoral arteries were entered percutaneously and appropriate pro-glide suture closure device was inserted bilaterally. A short 8 French sheath was inserted on the left side a long 8 French sheath on the right side. I assisted Dr. Trula Slade throughout the procedure and at the appropriate time cannulated the contralateral gate using a Kumpe catheter and Bentson wire and later deployed the contralateral limb into the right common iliac artery. The main device had been deployed into the left common iliac artery. Following completion of the procedure and angiography to rule out significant and no leaks both femoral arteries were closed using the pro-glide suture device quite satisfactorily after removal of sheaths and guidewires. Patient tolerated the procedure well was taken to the recovery room in stable condition  Tinnie Gens, MD 04/13/2014 1:38 PM

## 2014-04-13 NOTE — Consult Note (Addendum)
CARDIOLOGY CONSULT NOTE  Patient ID: Nathaniel [name]. MRN: [id number] DOB/AGE: [birth year] 78 y.o.  Admit date: 04/13/2014 Primary Cardiologist; Dr. Aundra Dubin Reason for Consultation: Atrial fibrillation  HPI: 80 yo with history of PAD, CAD s/p CABG, atrial fibrillation noted only after prior cardiac surgery, bioprosthetic AVR, and carotid stenosis now is s/p endovascular repair of AAA.  This was successful.  However, patient was noted to be in atrial fibrillation with mild RVR post-operatively.  He was in NSR when I saw him last in the office.  Presumably, he has been in NSR since his one episode of post-op atrial fibrillation after having valve surgery in 2007.  He was on coumadin at that time, he thinks, for about 5-6 months. He had no problems with coumadin.    Prior to procedure, he was doing well with no exertional chest pain or dyspnea.  He did not feel palpitations.  He actually does not feel palpitations now and would not realize anything had changed.  Telemetry shows atrial fibrillation.  Awaiting ECG.   PMH: 1. PAD: Peripheral arterial dopplers in 2012 showed > 75% bilateral SFA stenosis. Peripheral arterial dopplers in 10/14 showed > 50% focal bilateral SFA stenoses.  2. AAA: [place] 4/15 with 4.4 cm AAA but concern for penetrating ulcers.  CTA abdomen showed 4.4 cm AAA with penetrating ulcers and possible pseudoaneurysms.  Now s/p endovascular repair of AAA with no complications.  3. Carotid stenosis: Carotid dopplers (4/15) with 60-79% bilateral ICA stenosis, RICA may be near surgical range.  4. CAD: CABG 1989 with LIMA-LAD, SVG-D, seq SVG-ramus and OM, sequential SVG-PDA/PLV. SVG-PDA/PLV found to be occluded on cath prior to AVR, so patient had SVG-PDA with AVR in 2007.  5. Severe aortic stenosis: Bioprosthetic AVR in 2007. Echo (11/10) with EF 60-65%, basal to mid inferior hypokinesis, bioprosthetic aortic valve without significant stenosis/regurgitation, mild MR. Echo (11/14) with EF  55-60%, mild LVH, bioprosthetic aortic valve looked ok.  6. Atrial fibrillation: Paroxysmal. Only noted post-operatively after cardiac surgery.  7. Type II diabetes  8. Gout  9. GERD  10. Hyperlipidemia  11. HTN 12. GERD   SH: Widower, 2 children, raised his 3 grand-daughters, lives in [place], [place]. Occasional ETOH, no smoking.   FH: Father died from ruptured AAA.   ROS: All systems reviewed and negative except as per HPI.    Prescriptions prior to admission  Medication Sig Dispense Refill  . ACTOS 30 MG tablet Take 30 mg by mouth daily.       Marland Kitchen allopurinol (ZYLOPRIM) 100 MG tablet Take 100 mg by mouth daily.        Marland Kitchen aspirin 81 MG tablet Take 81 mg by mouth daily.        Marland Kitchen atorvastatin (LIPITOR) 20 MG tablet Take 20 mg by mouth daily.      . beta carotene w/minerals (OCUVITE) tablet Take 1 tablet by mouth daily.      . Coenzyme Q10 (CO Q 10) 100 MG CAPS Take 1 capsule by mouth daily.      . digoxin (LANOXIN) 0.25 MG tablet Take 0.25 mg by mouth daily.      Marland Kitchen doxazosin (CARDURA) 1 MG tablet Take 1-2 mg by mouth 2 (two) times daily. Take 2 in the am and 1 in the pm      . glucosamine-chondroitin 500-400 MG tablet Take 1 tablet by mouth daily.      Marland Kitchen LOVAZA 1 G capsule Take 1 g by mouth 3 (three) times daily.  3 daily      . ramipril (ALTACE) 5 MG capsule Take 5 mg by mouth daily.      Marland Kitchen ZETIA 10 MG tablet Take 10 mg by mouth daily.         Physical exam Blood pressure 142/73, pulse 71, temperature 97.8 F (36.6 C), temperature source Oral, resp. rate 25, SpO2 95.00%. General: NAD Neck: JVP 8-9 cm, no thyromegaly or thyroid nodule.  Lungs: Crackles at bases bilaterally CV: Nondisplaced PMI.  Heart mildly tachy, irregular S1/S2, no S3/S4, no murmur.  Trace ankle edema.  Bilateral soft carotid bruits.  Abdomen: Soft, nontender, no hepatosplenomegaly, no distention.  Skin: Intact without lesions or rashes.  Neurologic: Alert and oriented x 3.  Psych: Normal  affect. Extremities: No clubbing or cyanosis.  HEENT: Normal.   Labs:   Lab Results  Component Value Date   WBC 5.8 04/13/2014   HGB 12.3* 04/13/2014   HCT 37.1* 04/13/2014   MCV 90.9 04/13/2014   PLT 169 04/13/2014    Recent Labs Lab 04/12/14 1244 04/13/14 1054  NA 137 139  K 4.6 4.4  CL 101 104  CO2 23 25  BUN 14 17  CREATININE 0.95 0.97  CALCIUM 9.3 8.6  PROT 7.0  --   BILITOT 1.0  --   ALKPHOS 64  --   ALT 27  --   AST 28  --   GLUCOSE 119* 119*    EKG: awaiting ECG  ASSESSMENT AND PLAN: 80 yo with history of PAD, CAD s/p CABG, atrial fibrillation noted only after prior cardiac surgery, bioprosthetic AVR, and carotid stenosis now is s/p endovascular repair of AAA.  This was successful.  However, patient was noted to be in atrial fibrillation with mild RVR post-operatively. 1. Atrial fibrillation: Paroxysmal.  This is his 2nd known episode, first was post-op AVR.  At this point, I do think that he needs to be anticoagulated.  He has multiple CVA risk factors. The choice of anticoagulation in this situation probably needs to be warfarin.  He has had a valve replacement (bioprosthetic AVR), and NOACs are indicated only for nonvalvular atrial fibrillation.  He also has extensive CAD and PAD, though this has been stable.  It should be safe to stop aspirin while on coumadin in the setting of stable chronic CAD.  However, data is less certain about being off aspirin and only on Eliquis or Xarelto in the setting of significant CAD. Therefore, I would lean towards coumadin here.  - Start coumadin per pharmacy when deemed safe by vascular surgery.  - When his INR is therapeutic, can stop ASA 81.  - Start Toprol XL 25 mg bid for rate control.  - If he is not particularly symptomatic and rate is reasonably controlled, would plan for an attempt at DCCV after 4 wks therapeutic INR if he remains in atrial fibrillation.  - If he leaves over weekend, will need to have coumadin clinic followup.  2.  CAD: Stable.  Continue statin.  As above, will stop ASA when INR therapeutic on coumadin.  3. AAA: s/p endovascular repair.  Per [facility].  4. Bioprosthetic AVR: Stable.   Larey Dresser 04/13/2014 2:01 PM

## 2014-04-13 NOTE — Progress Notes (Signed)
Patient received to room 3South bed 1 from PACU alert and oriented x's4. Vitals obtained  Vascular checks done. Family in room and settled.Instructed on how to call for assistance.

## 2014-04-13 NOTE — H&P (View-Only) (Signed)
Patient name: Nathaniel [name]. MRN: [id number] DOB: [birth year] Sex: male   Referred by: Dr. Aundra Dubin  Reason for referral:  Chief Complaint  Patient presents with  . New Evaluation    bilateral carotid stenosis and increase in size of AAA    HISTORY OF PRESENT ILLNESS: This is a very pleasant 80 year old gentleman that I have seen for evaluation of an abdominal aortic aneurysm.  The patient has been followed for many years for a slowly enlarging abdominal aortic aneurysm.  The most recent scan showed a slight enlargement to 4.4 cm.,  And a CT scan was obtained.  This showed a 4.4 cm aneurysm which was very ulcerated.  The patient notes some vague abdominal discomfort as well as frequent urination.  He also reports that his father died of a ruptured abdominal aortic aneurysm.  He has been having significant a difficult time recently following his CT scan, regarding his aneurysm.  The patient has an extensive history of coronary artery disease.  He is undergone CABG and redo CABG.  He is also undergone AVR.  He is very active and plays golf regularly.  His hypercholesterolemia is managed with a statin.  His hypertension is managed with an ACE inhibitor.  He denies symptoms of claudication.  Past Medical History  Diagnosis Date  . CAD (coronary artery disease)   . Atrial fibrillation   . Diabetes mellitus   . Hyperlipemia   . Hx: UTI (urinary tract infection)   . Gout   . Hypertension   . Cataract   . GERD (gastroesophageal reflux disease)   . Peripheral vascular disease   . Atrial fibrillation     Past Surgical History  Procedure Laterality Date  . Appendectomy    . Cardiac valve surgery    . Angioplasty      Unsure if he had stents put in   . Coronary artery bypass graft    . Hernia repair    . Tonsillectomy      History   Social History  . Marital Status: Widowed    Spouse Name: N/A    Number of Children: N/A  . Years of Education: N/A   Occupational History  .  [employer]     Social History Main Topics  . Smoking status: Former Smoker -- 20 years    Types: Cigarettes  . Smokeless tobacco: Never Used  . Alcohol Use: 6.0 oz/week    10 Glasses of wine per week     Comment: 1-2 drinks daily   . Drug Use: No  . Sexual Activity: Not on file   Other Topics Concern  . Not on file   Social History Narrative   1 caffeine drink daily     Family History  Problem Relation Age of Onset  . Colon cancer Neg Hx   . Heart disease Father     before age 17  . AAA (abdominal aortic aneurysm) Father   . Heart disease Mother     Allergies as of 04/09/2014  . (No Known Allergies)    Current Outpatient Prescriptions on File Prior to Visit  Medication Sig Dispense Refill  . ACTOS 30 MG tablet Take 30 mg by mouth daily.       Marland Kitchen allopurinol (ZYLOPRIM) 100 MG tablet Take 100 mg by mouth daily.        Marland Kitchen aspirin 81 MG tablet Take 81 mg by mouth daily.        Marland Kitchen atorvastatin (LIPITOR)  10 MG tablet 1 tablet daily with a 20mg  tablet daily  30 tablet  3  . atorvastatin (LIPITOR) 20 MG tablet Take 20 mg by mouth daily.      Marland Kitchen doxazosin (CARDURA) 1 MG tablet at bedtime. Take 2 in the am and 1 in the pm      . LOVAZA 1 G capsule Take 1 g by mouth 3 (three) times daily. 3 daily      . ramipril (ALTACE) 5 MG capsule TAKE ONE CAPSULE EACH DAY  90 capsule  3  . ZETIA 10 MG tablet Take 10 mg by mouth daily.        No current facility-administered medications on file prior to visit.     REVIEW OF SYSTEMS: Cardiovascular: No chest pain, chest pressure, palpitations, positive for shortness of breath when lying flat and with exertion.  Positive for pain in legs with walking Pulmonary: No productive cough, asthma or wheezing. Neurologic: No weakness, paresthesias, aphasia, or amaurosis.  Positive for dizziness. Hematologic: No bleeding problems or clotting disorders. Musculoskeletal: No joint pain or joint swelling. Gastrointestinal: No blood in stool or  hematemesis Genitourinary: No dysuria or hematuria. Psychiatric:: No history of major depression. Integumentary: No rashes or ulcers. Constitutional: No fever or chills.  PHYSICAL EXAMINATION: General: The patient appears their stated age.  Vital signs are BP 134/94  Pulse 95  Ht 5' 10.5" (1.791 m)  Wt 202 lb 4.8 oz (91.763 kg)  BMI 28.61 kg/m2  SpO2 98% HEENT:  No gross abnormalities Pulmonary: Respirations are non-labored Abdomen: Soft.  Mild discomfort with deep palpation.  Musculoskeletal: There are no major deformities.   Neurologic: No focal weakness or paresthesias are detected, Skin: There are no ulcer or rashes noted. Psychiatric: The patient has normal affect. Cardiovascular: There is a regular rate and rhythm without significant murmur appreciated.  Palpable femoral pulses.  Palpable left pedal pulse faintly palpable right  Diagnostic Studies: I have reviewed his carotid angiogram which shows 60-79% bilateral carotid stenosis.  The patient has a CT angiogram which shows an ulcerated infrarenal abdominal aorta with maximum diameter of 4.4 cm   Assessment:  #1 Abdominal aortic aneurysm #2: Carotid stenosis Plan: #1: I had a lengthy conversation regarding treatment options of this ulcerated infrarenal abdominal aortic aneurysm.  The patient is very apprehensive about this.  It has resurfaced thoughts of his father dying from a ruptured aneurysm.  He is having trouble sleeping at night thinking about this.  He doesn't endorse mild abdominal discomfort that has been going on for approximately one-2 weeks.  I proposed continuing to monitor this or going ahead and having it fixed, despite the relatively low risk of rupture at this size.  Because of the patient's apprehension about rupture, we have collectively decided to proceed with endovascular repair.  We discussed the risks and benefits including the risk of intestinal ischemia, cardiopulmonary complications, lower extremity  ischemia, stroke.  We also discussed the need for future interventions and serial followup.  His operation is been scheduled for this Friday, May 1.  #2: The patient will followup with repeat carotid Doppler studies in 6 months.     Eldridge Abrahams, M.D. [facility] [place] Office: [phone] Pager:  [phone]

## 2014-04-13 NOTE — Progress Notes (Signed)
Utilization review completed.  

## 2014-04-13 NOTE — Progress Notes (Signed)
Pt was in rapid afib earlier this evening after vomiting.  Currently on Diltiazem drip with HR <100.  3L O2 no hypotension. Continue to follow for now.  Cardiology to review again in am.  [name], MD [facility] [place] Office: [phone] Pager: [phone]

## 2014-04-13 NOTE — Anesthesia Postprocedure Evaluation (Signed)
  Anesthesia Post-op Note  Patient: Nathaniel [name].  Procedure(s) Performed: Procedure(s): ABDOMINAL AORTIC ENDOVASCULAR STENT GRAFT (N/A)  Patient Location: PACU  Anesthesia Type:MAC  Level of Consciousness: awake, alert  and oriented  Airway and Oxygen Therapy: Patient Spontanous Breathing  Post-op Pain: none  Post-op Assessment: Post-op Vital signs reviewed, Patient's Cardiovascular Status Stable, Respiratory Function Stable, Patent Airway, No signs of Nausea or vomiting and Pain level controlled  Post-op Vital Signs: Reviewed and stable  Last Vitals:  Filed Vitals:   04/13/14 1030  BP:   Pulse: 84  Temp:   Resp: 16    Complications: No apparent anesthesia complications

## 2014-04-13 NOTE — Progress Notes (Signed)
Patient became nauseated and suddenly vomited up clear liquids. Patient received Zofran IV. Patient heart rate is between 120-140 and patient is in Afib. Have a call out to MD on call for further instructions.

## 2014-04-14 LAB — CBC
HCT: 37 % — ABNORMAL LOW (ref 39.0–52.0)
Hemoglobin: 12.2 g/dL — ABNORMAL LOW (ref 13.0–17.0)
MCH: 30.3 pg (ref 26.0–34.0)
MCHC: 33 g/dL (ref 30.0–36.0)
MCV: 91.8 fL (ref 78.0–100.0)
PLATELETS: 163 10*3/uL (ref 150–400)
RBC: 4.03 MIL/uL — ABNORMAL LOW (ref 4.22–5.81)
RDW: 13.6 % (ref 11.5–15.5)
WBC: 7.7 10*3/uL (ref 4.0–10.5)

## 2014-04-14 LAB — GLUCOSE, CAPILLARY
Glucose-Capillary: 128 mg/dL — ABNORMAL HIGH (ref 70–99)
Glucose-Capillary: 111 mg/dL — ABNORMAL HIGH (ref 70–99)

## 2014-04-14 LAB — BASIC METABOLIC PANEL
BUN: 14 mg/dL (ref 6–23)
CALCIUM: 8.3 mg/dL — AB (ref 8.4–10.5)
CO2: 25 mEq/L (ref 19–32)
Chloride: 104 mEq/L (ref 96–112)
Creatinine, Ser: 0.89 mg/dL (ref 0.50–1.35)
GFR calc non Af Amer: 79 mL/min — ABNORMAL LOW (ref >90)
Glucose, Bld: 106 mg/dL — ABNORMAL HIGH (ref 70–99)
Potassium: 4.3 mEq/L (ref 3.7–5.3)
Sodium: 139 mEq/L (ref 137–147)

## 2014-04-14 MED ORDER — WARFARIN SODIUM 3 MG PO TABS: 3.0000 mg | ORAL_TABLET | Freq: Every day | ORAL | Status: DC

## 2014-04-14 MED ORDER — ENOXAPARIN SODIUM 40 MG/0.4ML ~~LOC~~ SOLN: 90.0000 mg | SUBCUTANEOUS | Status: DC

## 2014-04-14 MED ORDER — METOPROLOL SUCCINATE ER 25 MG PO TB24: 25.0000 mg | ORAL_TABLET | Freq: Two times a day (BID) | ORAL | Status: DC

## 2014-04-14 NOTE — Progress Notes (Signed)
[facility] [place] [place]  Subjective  - Feels better   Objective 113/91 85 98.3 F (36.8 C) (Oral) 21 90%  Intake/Output Summary (Last 24 hours) at 04/14/14 0846 Last data filed at 04/14/14 0400  Gross per 24 hour  Intake   1700 ml  Output   1200 ml  Net    500 ml   Diltiazem off overnight HR controlled since early this morning 80-100 Abdomen soft non tender Groin sites no hematoma Feet warm bilaterally   Assessment/Planning: S/p EVAR doing well A fib per Cardiology Metoprolol coumadin Will  D/c home today  Nathaniel [name] 04/14/2014 8:46 AM --  Laboratory Lab Results:  Recent Labs  04/13/14 1054 04/14/14 0520  WBC 5.8 7.7  HGB 12.3* 12.2*  HCT 37.1* 37.0*  PLT 169 163   BMET  Recent Labs  04/13/14 1054 04/14/14 0520  NA 139 139  K 4.4 4.3  CL 104 104  CO2 25 25  GLUCOSE 119* 106*  BUN 17 14  CREATININE 0.97 0.89  CALCIUM 8.6 8.3*    COAG Lab Results  Component Value Date   INR 1.23 04/13/2014   INR 1.01 04/12/2014   INR 1.0 11/14/2007   No results found for this basename: PTT

## 2014-04-16 ENCOUNTER — Ambulatory Visit (INDEPENDENT_AMBULATORY_CARE_PROVIDER_SITE_OTHER): Payer: Medicare Other | Admitting: *Deleted

## 2014-04-16 ENCOUNTER — Telehealth: Payer: Self-pay | Admitting: *Deleted

## 2014-04-16 DIAGNOSIS — I4891 Unspecified atrial fibrillation: Secondary | ICD-10-CM | POA: Diagnosis not present

## 2014-04-16 LAB — POCT INR: INR: 1.1

## 2014-04-16 NOTE — Telephone Encounter (Signed)
Message copied by Katrine Coho on Mon Apr 16, 2014  8:21 AM ------      Message from: Larey Dresser      Created: Sat Apr 14, 2014  9:50 PM       Webb Silversmith,       Mr Trulson, needs Toprol XL 25 mg bid and need to make sure he has coumadin clinic followup.  He started coumadin for afib after aneurysm repair.  ------

## 2014-04-16 NOTE — Telephone Encounter (Signed)
Pharmacist called to question Dr. Oneida Alar Rx for Nathaniel [name]'s Lovenox. Based on the patient's weight the syringe rx should be 90 ml. She will make this change and dispense 6 syringes per order. Patient was to see [facility] today.

## 2014-04-16 NOTE — Telephone Encounter (Signed)
[facility] cardiology just called for clarification of the Lovenox order. They agreed with the dosage of 90 ml and will instruct the patient on administration.

## 2014-04-16 NOTE — Telephone Encounter (Signed)
Spoke with patient and he states he has Toprol XL 25mg  bid and he has been taking it.

## 2014-04-17 ENCOUNTER — Telehealth (HOSPITAL_COMMUNITY): Payer: Self-pay | Admitting: Anesthesiology

## 2014-04-17 ENCOUNTER — Encounter (HOSPITAL_COMMUNITY): Payer: Self-pay | Admitting: Surgery

## 2014-04-17 NOTE — Telephone Encounter (Signed)
Patient called about lovenox and reports he has not been taking since he was told to start. Discussed with Dr. Aundra Dubin and patient does not need to be on Lovenox. Continue coumadin which he is taking and follow up [facility] at [facility]. Patient to see Dr. Aundra Dubin on Monday.  Rande Brunt NP-C 12:34 PM

## 2014-04-17 NOTE — Discharge Summary (Signed)
[facility] Discharge Summary  Nathaniel [name]. November 16, 1932 80 y.o. male  [id number]  Admission Date: 04/13/2014  Discharge Date: 04/14/14  Physician: No att. providers found  Admission Diagnosis: AAA   HPI:   This is a 80 y.o. male that I have seen for evaluation of an abdominal aortic aneurysm. The patient has been followed for many years for a slowly enlarging abdominal aortic aneurysm. The most recent scan showed a slight enlargement to 4.4 cm., And a CT scan was obtained. This showed a 4.4 cm aneurysm which was very ulcerated. The patient notes some vague abdominal discomfort as well as frequent urination. He also reports that his father died of a ruptured abdominal aortic aneurysm. He has been having significant a difficult time recently following his CT scan, regarding his aneurysm.  The patient has an extensive history of coronary artery disease. He is undergone CABG and redo CABG. He is also undergone AVR. He is very active and plays golf regularly. His hypercholesterolemia is managed with a statin. His hypertension is managed with an ACE inhibitor. He denies symptoms of claudication.  Hospital Course:  The patient was admitted to the hospital and taken to the operating room on 04/13/2014 and underwent: #1: Bilateral ultrasound-guided common femoral artery access  #2: Endovascular repair of abdominal aortic aneurysm  #3: Distal extension x1  #4: Catheter in aorta x2  #5: Abdominal aortogram    The pt tolerated the procedure well and was transported to the PACU in good condition. That afternoon, a cardiology consult was obtained for atrial fibrillation.  This is paroxysmal and his 2nd known episode (the first ws post op AVR).  It was thought by cardiology that the pt did need to be anticoagulated as he has multiple risk factors.  It is thought it to be safe to discontinue aspirin while on coumadin in the setting of stable chronic CAD.  However, data is less  certain about being off asa and only on Eliquis or Xarelto in the setting of significant CAD and therefore, coumadin was recommended.  Toprol was started for rate control.  Going to plan for DCCV after four weeks with therapeutic INR if he remains in AFib. He was discharged home on POD 1 with a lovenox bridge for coumadin.  The remainder of the hospital course consisted of increasing mobilization and increasing intake of solids without difficulty.  CBC    Component Value Date/Time   WBC 7.7 04/14/2014 0520   RBC 4.03* 04/14/2014 0520   HGB 12.2* 04/14/2014 0520   HCT 37.0* 04/14/2014 0520   PLT 163 04/14/2014 0520   MCV 91.8 04/14/2014 0520   MCH 30.3 04/14/2014 0520   MCHC 33.0 04/14/2014 0520   RDW 13.6 04/14/2014 0520   LYMPHSABS 0.9 11/11/2007 2042   MONOABS 0.6 11/11/2007 2042   EOSABS 0.1* 11/11/2007 2042   BASOSABS 0.0 11/11/2007 2042    BMET    Component Value Date/Time   NA 139 04/14/2014 0520   K 4.3 04/14/2014 0520   CL 104 04/14/2014 0520   CO2 25 04/14/2014 0520   GLUCOSE 106* 04/14/2014 0520   GLUCOSE 94 10/11/2006 1605   BUN 14 04/14/2014 0520   CREATININE 0.89 04/14/2014 0520   CALCIUM 8.3* 04/14/2014 0520   GFRNONAA 79* 04/14/2014 0520   GFRAA >90 04/14/2014 0520     Discharge Instructions:   The patient is discharged to home with extensive instructions on wound care and progressive ambulation.  They are instructed not  to drive or perform any heavy lifting until returning to see the physician in his office.  Discharge Orders   Future Appointments Provider Department Dept Phone   04/20/2014 9:00 AM [facility] [place] Office [phone]   04/23/2014 3:30 PM Larey Dresser, MD [place] Office [phone]   Future Orders Complete By Expires   Call MD for:  redness, tenderness, or signs of infection (pain, swelling, bleeding, redness, odor or green/yellow discharge around incision site)  As directed    Call MD for:  severe or increased pain, loss  or decreased feeling  in affected limb(s)  As directed    Call MD for:  temperature >100.5  As directed    Discharge instructions  As directed    Driving Restrictions  As directed    Lifting restrictions  As directed    Remove dressing in 24 hours  As directed    Resume previous diet  As directed       Discharge Diagnosis:  AAA  Secondary Diagnosis: Patient Active Problem List   Diagnosis Date Noted  . AAA (abdominal aortic aneurysm) without rupture 04/13/2014  . Occlusion and stenosis of carotid artery without mention of cerebral infarction 04/09/2014  . CAD (coronary artery disease) of artery bypass graft 09/14/2012  . Abdominal aortic aneurysm 09/14/2012  . Peripheral vascular disease with claudication 09/14/2012  . Carotid artery disease without cerebral infarction 09/14/2012  . History of aortic valve replacement with bioprosthetic valve 09/14/2012  . Essential hypertension, malignant 09/14/2012  . Hyperlipidemia LDL goal < 70 09/14/2012  . ABDOMINAL AORTIC ANEURYSM 07/06/2009  . CEREBROVASCULAR DISEASE, HX OF 07/06/2009   Past Medical History  Diagnosis Date  . CAD (coronary artery disease)   . Atrial fibrillation   . Diabetes mellitus   . Hyperlipemia   . Hx: UTI (urinary tract infection)   . Gout   . Hypertension   . Cataract   . GERD (gastroesophageal reflux disease)   . Peripheral vascular disease   . Atrial fibrillation   . Shortness of breath   . Hernia, abdominal        Medication List         ACTOS 30 MG tablet  Generic drug:  pioglitazone  Take 30 mg by mouth daily.     allopurinol 100 MG tablet  Commonly known as:  ZYLOPRIM  Take 100 mg by mouth daily.     aspirin 81 MG tablet  Take 81 mg by mouth daily.     atorvastatin 20 MG tablet  Commonly known as:  LIPITOR  Take 20 mg by mouth daily.     beta carotene w/minerals tablet  Take 1 tablet by mouth daily.     Co Q 10 100 MG Caps  Take 1 capsule by mouth daily.     digoxin 0.25 MG  tablet  Commonly known as:  LANOXIN  Take 0.25 mg by mouth daily.     doxazosin 1 MG tablet  Commonly known as:  CARDURA  Take 1-2 mg by mouth 2 (two) times daily. Take 2 in the am and 1 in the pm     enoxaparin 40 MG/0.4ML injection  Commonly known as:  LOVENOX  Inject 0.9 mLs (90 mg total) into the skin daily.     glucosamine-chondroitin 500-400 MG tablet  Take 1 tablet by mouth daily.     LOVAZA 1 G capsule  Generic drug:  omega-3 acid ethyl esters  Take 1 g  by mouth 3 (three) times daily. 3 daily     metoprolol succinate 25 MG 24 hr tablet  Commonly known as:  TOPROL-XL  Take 1 tablet (25 mg total) by mouth 2 (two) times daily.     ramipril 5 MG capsule  Commonly known as:  ALTACE  Take 5 mg by mouth daily.     warfarin 3 MG tablet  Commonly known as:  COUMADIN  Take 1 tablet (3 mg total) by mouth daily at 6 PM.     ZETIA 10 MG tablet  Generic drug:  ezetimibe  Take 10 mg by mouth daily.          Disposition: home  Patient's condition: is Good  Follow up: 1. Dr. Trula Slade in 4 weeks with CTA 2. [facility] 04/20/14 3. Dr. Aundra Dubin 1 week  Leontine Locket, PA-C [facility] [phone] 04/17/2014  12:42 PM   - For VQI Registry use --- Instructions: Press F2 to tab through selections.  Delete question if not applicable.   Post-op:  Time to Extubation: [ x] In OR, [ ]  < 12 hrs, [ ]  12-24 hrs, [ ]  >=24 hrs Vasopressors Req. Post-op: Yes-diltiazem for AFiib MI: no, [ ]  Troponin only, [ ]  EKG or Clinical New Arrhythmia: Yes, AFib CHF: No ICU Stay: 1 day in stepdown Transfusion: No  If yes, n/a units given  Complications: Resp failure: no, [ ]  Pneumonia, [ ]  Ventilator Chg in renal function: no, [ ]  Inc. Cr > 0.5, [ ]  Temp. Dialysis, [ ]  Permanent dialysis Leg ischemia: no, no Surgery needed, [ ]  Yes, Surgery needed, [ ]  Amputation Bowel ischemia: no, [ ]  Medical Rx, [ ]  Surgical Rx Wound complication: no, [ ]  Superficial  separation/infection, [ ]  Return to OR Return to OR: No  Return to OR for bleeding: No Stroke: no, [ ]  Minor, [ ]  Major  Discharge medications: Statin use:  Yes If No: [ ]  For Medical reasons, [ ]  Non-compliant, [ ]  Not-indicated ASA use:  Yes  If No: [ ]  For Medical reasons, [ ]  Non-compliant, [ ]  Not-indicated Plavix use:  No If No: [ ]  For Medical reasons, [ ]  Non-compliant, [ ]  Not-indicated Beta blocker use:  No If No: [ ]  For Medical reasons, [ ]  Non-compliant, [ ]  Not-indicated Coumadin:  Yes

## 2014-04-20 ENCOUNTER — Ambulatory Visit (INDEPENDENT_AMBULATORY_CARE_PROVIDER_SITE_OTHER): Payer: Medicare Other | Admitting: *Deleted

## 2014-04-20 DIAGNOSIS — I4891 Unspecified atrial fibrillation: Secondary | ICD-10-CM | POA: Diagnosis not present

## 2014-04-20 LAB — POCT INR: INR: 1.1

## 2014-04-20 NOTE — Discharge Summary (Signed)
Agree with above  Nathaniel [name] 

## 2014-04-23 ENCOUNTER — Encounter: Payer: Self-pay | Admitting: Cardiology

## 2014-04-23 ENCOUNTER — Ambulatory Visit (INDEPENDENT_AMBULATORY_CARE_PROVIDER_SITE_OTHER): Payer: Medicare Other

## 2014-04-23 ENCOUNTER — Ambulatory Visit (INDEPENDENT_AMBULATORY_CARE_PROVIDER_SITE_OTHER): Payer: Medicare Other | Admitting: Cardiology

## 2014-04-23 ENCOUNTER — Other Ambulatory Visit: Payer: Self-pay | Admitting: *Deleted

## 2014-04-23 VITALS — BP 138/78 | HR 69 | Ht 70.0 in | Wt 198.0 lb

## 2014-04-23 DIAGNOSIS — I714 Abdominal aortic aneurysm, without rupture, unspecified: Secondary | ICD-10-CM

## 2014-04-23 DIAGNOSIS — I6529 Occlusion and stenosis of unspecified carotid artery: Secondary | ICD-10-CM

## 2014-04-23 DIAGNOSIS — E785 Hyperlipidemia, unspecified: Secondary | ICD-10-CM

## 2014-04-23 DIAGNOSIS — Z953 Presence of xenogenic heart valve: Secondary | ICD-10-CM

## 2014-04-23 DIAGNOSIS — I25810 Atherosclerosis of coronary artery bypass graft(s) without angina pectoris: Secondary | ICD-10-CM | POA: Diagnosis not present

## 2014-04-23 DIAGNOSIS — I4891 Unspecified atrial fibrillation: Secondary | ICD-10-CM

## 2014-04-23 DIAGNOSIS — I739 Peripheral vascular disease, unspecified: Secondary | ICD-10-CM | POA: Diagnosis not present

## 2014-04-23 DIAGNOSIS — I5032 Chronic diastolic (congestive) heart failure: Secondary | ICD-10-CM | POA: Insufficient documentation

## 2014-04-23 DIAGNOSIS — I509 Heart failure, unspecified: Secondary | ICD-10-CM

## 2014-04-23 DIAGNOSIS — Z952 Presence of prosthetic heart valve: Secondary | ICD-10-CM

## 2014-04-23 LAB — POCT INR: INR: 1.4

## 2014-04-23 MED ORDER — WARFARIN SODIUM 3 MG PO TABS: ORAL_TABLET | ORAL | Status: DC

## 2014-04-23 NOTE — Progress Notes (Signed)
Patient ID: Joven Mom., male   DOB: 20-Nov-1932, 80 y.o.   MRN: [id number] PCP: Dr. Elyse Hsu  80 yo with history of extensive vascular disease including CAD s/p CABG and redo CABG, AAA s/p repair, PAD, and carotid stenosis as well as prior AVR presents for cardiology followup.   He has had documentation of significant peripheral arterial disease.  He gets bilateral calf soreness after walking for about 15 minutes.  This resolves with rest, no rest pain.  No pedal ulcers, though when he gets cuts on his feet, they heal slowly.  Peripheral arterial dopplers done in 10/14 showed > 50% focal SFA stenoses bilaterally.  He has at least moderate right common carotid stenosis that may be nearing surgical range.  No stroke-like symptoms.  He had a recent endovascular AAA repair in 5/15.    After his AAA repair, he was noted to be in atrial fibrillation.  He had had a prior episode of atrial fibrillation after his cardiac surgery in 2007.  Atrial fibrillation was rate-controlled, and he remains in atrial fibrillation today.  He was started on warfarin and Toprol XL.  Somehow it looks like he was also restarted on digoxin when he left the hospital.  He has been stable symptomatically.  He is not back to playing golf but denies exertional dyspnea or chest pain.  He does not feel palpitations. Overall feels like his energy level is not great but this has not changed compared to prior to AAA repair (has been like this for a long time).    ECG: atrial fibrillation, left axis deviation, incomplete LBBB, LVH, nonspecific lateral ST-T changes.   Labs (5/15): K 4.3, creatinine 0.89  PMH: 1. PAD: Peripheral arterial dopplers in 2012 showed > 75% bilateral SFA stenosis. Peripheral arterial dopplers in 10/14 showed > 50% focal bilateral SFA stenoses.  2. AAA: [place] 4/15 with 4.4 cm AAA but concern for penetrating ulcers. CTA abdomen showed 4.4 cm AAA with penetrating ulcers and possible pseudoaneurysms. Now s/p endovascular  repair of AAA in 9/38 with no complications.  3. Carotid stenosis: Carotid dopplers (4/15) with 60-79% bilateral ICA stenosis, RICA may be near surgical range.  4. CAD: CABG 1989 with LIMA-LAD, SVG-D, seq SVG-ramus and OM, sequential SVG-PDA/PLV. SVG-PDA/PLV found to be occluded on cath prior to AVR, so patient had SVG-PDA with AVR in 2007.  5. Severe aortic stenosis: Bioprosthetic AVR in 2007. Echo (11/10) with EF 60-65%, basal to mid inferior hypokinesis, bioprosthetic aortic valve without significant stenosis/regurgitation, mild MR. Echo (11/14) with EF 55-60%, mild LVH, bioprosthetic aortic valve looked ok.  6. Atrial fibrillation: Paroxysmal. Initially noted after cardiac surgery, then again after AAA repair.   7. Type II diabetes  8. Gout  9. GERD  10. Hyperlipidemia  11. HTN 12. GERD   SH: Widower, 2 children, raised his 3 grand-daughters, lives in [place], [place].  Occasional ETOH, no smoking.   FH: Father died from ruptured AAA.   ROS: All systems reviewed and negative except as per HPI.   Current Outpatient Prescriptions  Medication Sig Dispense Refill  . ACTOS 30 MG tablet Take 30 mg by mouth daily.       Marland Kitchen allopurinol (ZYLOPRIM) 100 MG tablet Take 100 mg by mouth daily.        Marland Kitchen aspirin 81 MG tablet Take 81 mg by mouth daily.        Marland Kitchen atorvastatin (LIPITOR) 20 MG tablet Take 20 mg by mouth daily.      Marland Kitchen  beta carotene w/minerals (OCUVITE) tablet Take 1 tablet by mouth daily.      . Coenzyme Q10 (CO Q 10) 100 MG CAPS Take 1 capsule by mouth daily.      Marland Kitchen doxazosin (CARDURA) 1 MG tablet Take 2 in the am and 1 in the pm      . glucosamine-chondroitin 500-400 MG tablet Take 1 tablet by mouth daily.      Marland Kitchen LOVAZA 1 G capsule Take 1 g by mouth 3 (three) times daily.       . metoprolol succinate (TOPROL-XL) 25 MG 24 hr tablet Take 1 tablet (25 mg total) by mouth 2 (two) times daily.  60 tablet  1  . ramipril (ALTACE) 5 MG capsule Take 5 mg by mouth daily.      Marland Kitchen ZETIA 10 MG  tablet Take 10 mg by mouth daily.       Marland Kitchen warfarin (COUMADIN) 3 MG tablet Take as directed by anticoagulation clinic  70 tablet  1   No current facility-administered medications for this visit.    BP 138/78  Pulse 69  Ht 5\' 10"  (1.778 m)  Wt 89.812 kg (198 lb)  BMI 28.41 kg/m2 General: NAD Neck: JVP 8 cm, no thyromegaly or thyroid nodule.  Lungs: Clear to auscultation bilaterally with normal respiratory effort. CV: Nondisplaced PMI.  Heart irregular S1/S2, no S3/S4, 2/6 SEM RUSB.  1+ edema 1/3 up lower legs bilaterally.  Soft right carotid bruit.  1+ left PT, unable to feel right PT.   Abdomen: Soft, nontender, no hepatosplenomegaly, no distention.  Skin: Intact without lesions or rashes.  Neurologic: Alert and oriented x 3.  Psych: Normal affect. Extremities: No clubbing or cyanosis.   Assessment/Plan:  1. CAD: s/p CABG and redo CABG.  Stable with no ischemic symptoms.  Continue ASA 81, statin, ramipril.   2. Hyperlipidemia: He is on atorvastatin, Zetia, and Lovaza. Need to get last lipids from PCP. He should have aggressive control given extensive vascular disease.  I would like LDL < 70.  3. PAD: Patient has moderate claudication symptoms.  No rest symptoms or foot ulcers. Peripheral arterial dopplers showed > 50% bilateral focal SFA stenoses.  I have talked to the patient about a trial of cilostazol +/- PV referral.  At this time, he does not want either.  We have talked about trying to walk through the pain for a short distance as this may help encourage collaterals.  Symptoms have been stable.  4. Carotid stenosis: Most recent carotid dopplers were concerning for RICA stenosis nearing surgical range.  He has had no stroke-like symptoms.  He has seen Dr. Trula Slade and will be getting carotid dopplers again in 6 months.  5. AAA: Successful endovascular repair in 5/15.  6. Bioprosthetic AVR: Valve stable on 11/14 echo.  7. Paroxysmal atrial fibrillation: He has developed recurrent atrial  fibrillation post-op AAA repair.  He needs to be anticoagulated at this point.  Given the bioprosthetic aortic valve and his CAD, I ended up thinking that warfarin would be a better choice than NOAC.  He does not appear to be symptomatic from the atrial fibrillation though he does seem mildly volume overloaded on exam today.  - I think we should give him a chance to get out of atrial fibrillation, so will arrange for DCCV after 4 weeks of therapeutic INR.   - Continue Toprol XL.   - Can stop digoxin (not sure why he was restarted on this upon discharge from AAA repair).  -  Stop ASA 81 when INR > 2.  8. Chronic diastolic CHF: Mild volume overload on exam.  No significant dyspnea.  For now, will ask him to cut back on sodium intake.  His weight is actually down 8 lbs compared to prior appointment.   Followup in 1 month.    Larey Dresser 04/23/2014

## 2014-04-23 NOTE — Patient Instructions (Signed)
Your physician has recommended you make the following change in your medication:   1. Stop Digoxin.  Your physician recommends that you schedule a follow-up appointment in: 1 month with Dr. Aundra Dubin.

## 2014-04-27 ENCOUNTER — Ambulatory Visit (INDEPENDENT_AMBULATORY_CARE_PROVIDER_SITE_OTHER): Payer: Medicare Other

## 2014-04-27 DIAGNOSIS — I4891 Unspecified atrial fibrillation: Secondary | ICD-10-CM

## 2014-04-27 LAB — POCT INR: INR: 1.4

## 2014-04-30 ENCOUNTER — Encounter: Payer: Medicare Other | Admitting: Surgery

## 2014-05-02 ENCOUNTER — Ambulatory Visit (INDEPENDENT_AMBULATORY_CARE_PROVIDER_SITE_OTHER): Payer: Medicare Other | Admitting: *Deleted

## 2014-05-02 DIAGNOSIS — H04129 Dry eye syndrome of unspecified lacrimal gland: Secondary | ICD-10-CM | POA: Diagnosis not present

## 2014-05-02 DIAGNOSIS — Z9889 Other specified postprocedural states: Secondary | ICD-10-CM | POA: Diagnosis not present

## 2014-05-02 DIAGNOSIS — E119 Type 2 diabetes mellitus without complications: Secondary | ICD-10-CM | POA: Diagnosis not present

## 2014-05-02 DIAGNOSIS — H40019 Open angle with borderline findings, low risk, unspecified eye: Secondary | ICD-10-CM | POA: Diagnosis not present

## 2014-05-02 DIAGNOSIS — I4891 Unspecified atrial fibrillation: Secondary | ICD-10-CM

## 2014-05-02 DIAGNOSIS — Z961 Presence of intraocular lens: Secondary | ICD-10-CM | POA: Diagnosis not present

## 2014-05-02 LAB — POCT INR: INR: 2.3

## 2014-05-08 ENCOUNTER — Other Ambulatory Visit: Payer: Self-pay | Admitting: Cardiology

## 2014-05-09 ENCOUNTER — Ambulatory Visit (INDEPENDENT_AMBULATORY_CARE_PROVIDER_SITE_OTHER): Payer: Medicare Other | Admitting: *Deleted

## 2014-05-09 DIAGNOSIS — I4891 Unspecified atrial fibrillation: Secondary | ICD-10-CM

## 2014-05-09 LAB — POCT INR: INR: 4.5

## 2014-05-16 ENCOUNTER — Ambulatory Visit (INDEPENDENT_AMBULATORY_CARE_PROVIDER_SITE_OTHER): Payer: Medicare Other | Admitting: Pharmacist

## 2014-05-16 DIAGNOSIS — I4891 Unspecified atrial fibrillation: Secondary | ICD-10-CM

## 2014-05-16 LAB — POCT INR: INR: 3.4

## 2014-05-18 ENCOUNTER — Encounter: Payer: Self-pay | Admitting: Surgery

## 2014-05-21 ENCOUNTER — Encounter: Payer: Self-pay | Admitting: Surgery

## 2014-05-21 ENCOUNTER — Ambulatory Visit (INDEPENDENT_AMBULATORY_CARE_PROVIDER_SITE_OTHER): Payer: Self-pay | Admitting: Surgery

## 2014-05-21 ENCOUNTER — Ambulatory Visit
Admission: RE | Admit: 2014-05-21 | Discharge: 2014-05-21 | Disposition: A | Discharge status: Home / Self Care | Payer: Medicare Other | Source: Ambulatory Visit | Attending: Surgery | Admitting: Surgery

## 2014-05-21 VITALS — BP 124/65 | HR 106 | Ht 70.5 in | Wt 200.5 lb

## 2014-05-21 DIAGNOSIS — I714 Abdominal aortic aneurysm, without rupture, unspecified: Secondary | ICD-10-CM

## 2014-05-21 DIAGNOSIS — I723 Aneurysm of iliac artery: Secondary | ICD-10-CM | POA: Diagnosis not present

## 2014-05-21 DIAGNOSIS — Z48812 Encounter for surgical aftercare following surgery on the circulatory system: Secondary | ICD-10-CM

## 2014-05-21 MED ORDER — IOHEXOL 350 MG/ML SOLN
80.0000 mL | Freq: Once | INTRAVENOUS | Status: AC | PRN
  Administered 2014-05-21: 80 mL via INTRAVENOUS

## 2014-05-21 NOTE — Progress Notes (Signed)
The patient is back today for his first postoperative followup.  He is status post endovascular repair of abdominal aortic aneurysm on 04/13/2014.  His postoperative course was complicated by atrial fibrillation.  He was discharged on Coumadin.  Since being home, he is complaining of progressive shortness of breath and fatigue.  He does state that walking helps.  He is scheduled to see Dr. Marigene Ehlers this Thursday.  He has been playing golf a little bit.  On examination both groins are well-healed.  I have reviewed his CT angiogram which shows that the stent graft is in good position and there is complete exclusion of his aneurysm without evidence of endoleak.  I have relayed the CT scan findings to the patient.  I very pleased with the outcome of his operation.  He will most likely undergo cardioversion for his postoperative fibrillation.  He will follow up with me in 6 months with an abdominal ultrasound.  Annamarie Major

## 2014-05-21 NOTE — Addendum Note (Signed)
Addended by: Mena Goes on: 05/21/2014 05:23 PM   Modules accepted: Orders

## 2014-05-24 ENCOUNTER — Encounter: Payer: Self-pay | Admitting: *Deleted

## 2014-05-24 ENCOUNTER — Ambulatory Visit (INDEPENDENT_AMBULATORY_CARE_PROVIDER_SITE_OTHER): Payer: Medicare Other | Admitting: *Deleted

## 2014-05-24 ENCOUNTER — Ambulatory Visit (INDEPENDENT_AMBULATORY_CARE_PROVIDER_SITE_OTHER): Payer: Medicare Other | Admitting: Cardiology

## 2014-05-24 ENCOUNTER — Encounter: Payer: Self-pay | Admitting: Cardiology

## 2014-05-24 VITALS — BP 124/82 | HR 97 | Ht 70.5 in | Wt 197.0 lb

## 2014-05-24 DIAGNOSIS — I739 Peripheral vascular disease, unspecified: Secondary | ICD-10-CM | POA: Diagnosis not present

## 2014-05-24 DIAGNOSIS — I5031 Acute diastolic (congestive) heart failure: Secondary | ICD-10-CM | POA: Diagnosis not present

## 2014-05-24 DIAGNOSIS — R0602 Shortness of breath: Secondary | ICD-10-CM

## 2014-05-24 DIAGNOSIS — I25810 Atherosclerosis of coronary artery bypass graft(s) without angina pectoris: Secondary | ICD-10-CM | POA: Diagnosis not present

## 2014-05-24 DIAGNOSIS — I4891 Unspecified atrial fibrillation: Secondary | ICD-10-CM

## 2014-05-24 DIAGNOSIS — I714 Abdominal aortic aneurysm, without rupture, unspecified: Secondary | ICD-10-CM

## 2014-05-24 DIAGNOSIS — Z952 Presence of prosthetic heart valve: Secondary | ICD-10-CM

## 2014-05-24 DIAGNOSIS — I509 Heart failure, unspecified: Secondary | ICD-10-CM

## 2014-05-24 DIAGNOSIS — Z953 Presence of xenogenic heart valve: Secondary | ICD-10-CM

## 2014-05-24 DIAGNOSIS — I5032 Chronic diastolic (congestive) heart failure: Secondary | ICD-10-CM

## 2014-05-24 DIAGNOSIS — E785 Hyperlipidemia, unspecified: Secondary | ICD-10-CM

## 2014-05-24 DIAGNOSIS — I6529 Occlusion and stenosis of unspecified carotid artery: Secondary | ICD-10-CM

## 2014-05-24 LAB — POCT INR: INR: 2.1

## 2014-05-24 MED ORDER — FUROSEMIDE 40 MG PO TABS: 40.0000 mg | ORAL_TABLET | Freq: Every day | ORAL | Status: DC

## 2014-05-24 MED ORDER — POTASSIUM CHLORIDE CRYS ER 20 MEQ PO TBCR: 20.0000 meq | EXTENDED_RELEASE_TABLET | Freq: Every day | ORAL | Status: DC

## 2014-05-24 NOTE — Patient Instructions (Addendum)
Start lasix (furosemide) 40mg  daily.   Start KCL(potassium) 20 mEq daily.   Your physician has requested that you have an echocardiogram. Echocardiography is a painless test that uses sound waves to create images of your heart. It provides your doctor with information about the size and shape of your heart and how well your heart's chambers and valves are working. This procedure takes approximately one hour. There are no restrictions for this procedure.  Your physician recommends that you return for lab work in: about 10 days--BMET/BNP.  Your physician has recommended that you have a Cardioversion (DCCV). Electrical Cardioversion uses a jolt of electricity to your heart either through paddles or wired patches attached to your chest. This is a controlled, usually prescheduled, procedure. Defibrillation is done under light anesthesia in the hospital, and you usually go home the day of the procedure. This is done to get your heart back into a normal rhythm. You are not awake for the procedure. Please see the instruction sheet given to you today. Friday June 19,2015  Your physician recommends that you schedule a follow-up appointment in: 2 weeks with Dr Aundra Dubin.

## 2014-05-25 NOTE — Progress Notes (Signed)
Patient ID: Nathaniel [name], male   DOB: 1932-04-01, 80 y.o.   MRN: [id number] PCP: Dr. Elyse Hsu  80 yo with history of extensive vascular disease including CAD s/p CABG and redo CABG, AAA s/p repair, PAD, and carotid stenosis as well as prior AVR presents for cardiology followup.   He has had documentation of significant peripheral arterial disease.  He gets bilateral calf soreness after walking for about 15 minutes.  This resolves with rest, no rest pain.  No pedal ulcers, though when he gets cuts on his feet, they heal slowly.  Peripheral arterial dopplers done in 10/14 showed > 50% focal SFA stenoses bilaterally.  He has at least moderate right common carotid stenosis that may be nearing surgical range.  No stroke-like symptoms.  He had a recent endovascular AAA repair in 5/15.    After his AAA repair, he was noted to be in atrial fibrillation.  He had had a prior episode of atrial fibrillation after his cardiac surgery in 2007.  Atrial fibrillation was rate-controlled, and he remains in atrial fibrillation today.  He was started on warfarin and Toprol XL.  Initially, he did not appear to have any problems with atrial fibrillation in terms of dyspnea or fatigue.  We planned therapeutic anticoagulation x 1 month followed by cardioversion (can be as early as next week).  Today, he tells me that he has been feeling more fatigue.  He had 4 days about a week ago that were quite difficult with orthopnea and exertional dyspnea.  He says that he had been eating a high sodium diet during that period.  Currently, he is feeling better, but he still gets short of breath (mildly) after walking about 5 minutes.  No chest pain.  Currently no orthopnea.  He has been playing golf.   ECG: atrial fibrillation with rightward axis, IVCD, lateral T wave flattening   Labs (5/15): K 4.3, creatinine 0.89  PMH: 1. PAD: Peripheral arterial dopplers in 2012 showed > 75% bilateral SFA stenosis. Peripheral arterial dopplers in 10/14  showed > 50% focal bilateral SFA stenoses.  2. AAA: [place] 4/15 with 4.4 cm AAA but concern for penetrating ulcers. CTA abdomen showed 4.4 cm AAA with penetrating ulcers and possible pseudoaneurysms. Now s/p endovascular repair of AAA in 3/08 with no complications.  3. Carotid stenosis: Carotid dopplers (4/15) with 60-79% bilateral ICA stenosis, RICA may be near surgical range.  4. CAD: CABG 1989 with LIMA-LAD, SVG-D, seq SVG-ramus and OM, sequential SVG-PDA/PLV. SVG-PDA/PLV found to be occluded on cath prior to AVR, so patient had SVG-PDA with AVR in 2007.  5. Severe aortic stenosis: Bioprosthetic AVR in 2007. Echo (11/10) with EF 60-65%, basal to mid inferior hypokinesis, bioprosthetic aortic valve without significant stenosis/regurgitation, mild Nathaniel. Echo (11/14) with EF 55-60%, mild LVH, bioprosthetic aortic valve looked ok.  6. Atrial fibrillation: Paroxysmal. Initially noted after cardiac surgery, then again after AAA repair.   7. Type II diabetes  8. Gout  9. GERD  10. Hyperlipidemia  11. HTN 12. GERD   SH: Widower, 2 children, raised his 3 grand-daughters, lives in [place], [place].  Occasional ETOH, no smoking.   FH: Father died from ruptured AAA.   ROS: All systems reviewed and negative except as per HPI.   Current Outpatient Prescriptions  Medication Sig Dispense Refill  . ACTOS 30 MG tablet Take 30 mg by mouth daily.       Marland Kitchen allopurinol (ZYLOPRIM) 100 MG tablet Take 100 mg by mouth daily.        Marland Kitchen  atorvastatin (LIPITOR) 20 MG tablet Take 20 mg by mouth daily.      . beta carotene w/minerals (OCUVITE) tablet Take 1 tablet by mouth daily.      . Coenzyme Q10 (CO Q 10) 100 MG CAPS Take 1 capsule by mouth daily.      Marland Kitchen doxazosin (CARDURA) 1 MG tablet Take 2 in the am and 1 in the pm      . glucosamine-chondroitin 500-400 MG tablet Take 1 tablet by mouth daily.      Marland Kitchen LOVAZA 1 G capsule Take 1 g by mouth 3 (three) times daily.       . metoprolol succinate (TOPROL-XL) 25 MG 24 hr  tablet Take 1 tablet (25 mg total) by mouth 2 (two) times daily.  60 tablet  1  . ramipril (ALTACE) 5 MG capsule TAKE ONE CAPSULE EACH DAY  90 capsule  2  . warfarin (COUMADIN) 3 MG tablet Take as directed by anticoagulation clinic  70 tablet  1  . ZETIA 10 MG tablet Take 10 mg by mouth daily.       . furosemide (LASIX) 40 MG tablet Take 1 tablet (40 mg total) by mouth daily.  30 tablet  3  . potassium chloride SA (K-DUR,KLOR-CON) 20 MEQ tablet Take 1 tablet (20 mEq total) by mouth daily.  30 tablet  3   No current facility-administered medications for this visit.    BP 124/82  Pulse 97  Ht 5' 10.5" (1.791 m)  Wt 89.359 kg (197 lb)  BMI 27.86 kg/m2 General: NAD Neck: JVP 9-10 cm, no thyromegaly or thyroid nodule.  Lungs: Clear to auscultation bilaterally with normal respiratory effort. CV: Nondisplaced PMI.  Heart irregular S1/S2, no S3/S4, 2/6 SEM RUSB.  1+ edema 1/2 up lower legs bilaterally.  Soft right carotid bruit.  1+ left PT, unable to feel right PT.   Abdomen: Soft, nontender, no hepatosplenomegaly, no distention.  Skin: Intact without lesions or rashes.  Neurologic: Alert and oriented x 3.  Psych: Normal affect. Extremities: No clubbing or cyanosis.   Assessment/Plan:  1. CAD: s/p CABG and redo CABG.  Stable with no ischemic symptoms.  Continue statin, ramipril.  He is on warfarin in setting of stable CAD so not on ASA 81.  2. Hyperlipidemia: He is on atorvastatin, Zetia, and Lovaza. He should have aggressive control given extensive vascular disease.  I would like LDL < 70.  3. PAD: Patient has moderate claudication symptoms.  No rest symptoms or foot ulcers. Peripheral arterial dopplers showed > 50% bilateral focal SFA stenoses.  I have talked to the patient about a trial of cilostazol +/- PV referral.  At this time, he does not want either.  We have talked about trying to walk through the pain for a short distance as this may help encourage collaterals.  Symptoms have been  stable and is now followed by [facility].  4. Carotid stenosis: Most recent carotid dopplers were concerning for RICA stenosis nearing surgical range.  He has had no stroke-like symptoms.  He has seen Dr. Trula Slade and will be getting carotid dopplers in followup at [facility].  5. AAA: Successful endovascular repair in 5/15.  6. Bioprosthetic AVR: Valve stable on 11/14 echo.  7. Atrial fibrillation: He has developed recurrent atrial fibrillation post-op AAA repair that appear to be persistent.  Given the bioprosthetic aortic valve and his CAD, I ended up thinking that warfarin is a better choice than NOAC (he is now on warfarin).  He now  has some CHF symptoms that I think are related to going into atrial fibrillation.   - INR will have been therapeutic 4 weeks by next week.  I am going to arrange for DCCV next week.  If INR happens to be subtherapeutic at last check next week, would arrange for TEE-guided DCCV given symptoms. - Continue Toprol XL.   - If he does not convert next week, will need to load with either amiodarone or Tikosyn and retry.  Would like to get him out of atrial fibrillation given symptoms.  8. Chronic diastolic CHF: Patient appears volume overloaded now with NYHA class II-III symptoms.   - I will get echo to make sure that EF remains normal.  - Start Lasix 40 mg daily + KCl 20 mEq daily.  - BMET/BNP in 10 days.  - Needs to follow low sodium diet.  - Nathaniel [name] is on Actos.  Given volume overload, this needs to be changed.  He is going to get in touch with Dr. Elyse Hsu to get a different medication for his diabetes.     [name] 05/25/2014

## 2014-05-28 ENCOUNTER — Ambulatory Visit (HOSPITAL_COMMUNITY): Admission: RE | Admit: 2014-05-28 | Payer: Medicare Other | Source: Ambulatory Visit

## 2014-05-28 ENCOUNTER — Encounter (HOSPITAL_COMMUNITY): Payer: Self-pay | Admitting: Pharmacy Technician

## 2014-05-28 DIAGNOSIS — I10 Essential (primary) hypertension: Secondary | ICD-10-CM | POA: Diagnosis not present

## 2014-05-28 DIAGNOSIS — M109 Gout, unspecified: Secondary | ICD-10-CM | POA: Diagnosis not present

## 2014-05-28 DIAGNOSIS — E782 Mixed hyperlipidemia: Secondary | ICD-10-CM | POA: Diagnosis not present

## 2014-05-28 DIAGNOSIS — E1169 Type 2 diabetes mellitus with other specified complication: Secondary | ICD-10-CM | POA: Diagnosis not present

## 2014-05-28 DIAGNOSIS — I2510 Atherosclerotic heart disease of native coronary artery without angina pectoris: Secondary | ICD-10-CM | POA: Diagnosis not present

## 2014-06-01 ENCOUNTER — Ambulatory Visit (INDEPENDENT_AMBULATORY_CARE_PROVIDER_SITE_OTHER): Payer: Medicare Other | Admitting: *Deleted

## 2014-06-01 ENCOUNTER — Telehealth: Payer: Self-pay | Admitting: *Deleted

## 2014-06-01 ENCOUNTER — Encounter (HOSPITAL_COMMUNITY)
Admission: RE | Disposition: A | Discharge status: Home / Self Care | Payer: Self-pay | Source: Ambulatory Visit | Attending: Internal Medicine

## 2014-06-01 ENCOUNTER — Ambulatory Visit (HOSPITAL_COMMUNITY)
Admission: RE | Admit: 2014-06-01 | Discharge: 2014-06-01 | Disposition: A | Discharge status: Home / Self Care | Payer: Medicare Other | Source: Ambulatory Visit | Attending: Internal Medicine | Admitting: Internal Medicine

## 2014-06-01 DIAGNOSIS — I4891 Unspecified atrial fibrillation: Secondary | ICD-10-CM

## 2014-06-01 LAB — POCT INR: INR: 4.3

## 2014-06-01 SURGERY — CARDIOVERSION
Anesthesia: Monitor Anesthesia Care

## 2014-06-01 MED ORDER — SODIUM CHLORIDE 0.9 % IV SOLN: INTRAVENOUS | Status: DC

## 2014-06-01 MED ORDER — SODIUM CHLORIDE 0.9 % IJ SOLN: 3.0000 mL | INTRAMUSCULAR | Status: DC | PRN

## 2014-06-01 MED ORDER — SODIUM CHLORIDE 0.9 % IJ SOLN: 3.0000 mL | Freq: Two times a day (BID) | INTRAMUSCULAR | Status: DC

## 2014-06-01 MED ORDER — SODIUM CHLORIDE 0.9 % IV SOLN: 250.0000 mL | INTRAVENOUS | Status: DC

## 2014-06-01 NOTE — Telephone Encounter (Signed)
Received call from [place] nurse that pt had eaten breakfast and cardioversion had to be cancelled and is rescheduled for 1pm on June 24th  Called pt and instructed to change coumadin instructions given in clinic this am to hold coumadin today and then to continue on same dose 2 tablets daily except 3 tablets on Saturday and Tuesday and changed his appt to June 24th at 10:50am and pt states understanding.

## 2014-06-01 NOTE — Telephone Encounter (Signed)
Cardioversion scheduled for today cancelled because pt forgot and ate breakfast this morning. Cardioversion rescheduled for 06/06/14 at 1PM arrive at short stay at 11:30AM.

## 2014-06-04 ENCOUNTER — Other Ambulatory Visit (INDEPENDENT_AMBULATORY_CARE_PROVIDER_SITE_OTHER): Payer: Medicare Other

## 2014-06-04 DIAGNOSIS — I5031 Acute diastolic (congestive) heart failure: Secondary | ICD-10-CM | POA: Diagnosis not present

## 2014-06-04 DIAGNOSIS — I5032 Chronic diastolic (congestive) heart failure: Secondary | ICD-10-CM | POA: Diagnosis not present

## 2014-06-04 DIAGNOSIS — R0602 Shortness of breath: Secondary | ICD-10-CM | POA: Diagnosis not present

## 2014-06-04 LAB — BASIC METABOLIC PANEL
BUN: 34 mg/dL — AB (ref 6–23)
CALCIUM: 9.1 mg/dL (ref 8.4–10.5)
CO2: 27 mEq/L (ref 19–32)
Chloride: 102 mEq/L (ref 96–112)
Creatinine, Ser: 1.4 mg/dL (ref 0.4–1.5)
GFR: 50.55 mL/min — AB (ref >60.00)
Glucose, Bld: 145 mg/dL — ABNORMAL HIGH (ref 70–99)
Potassium: 4.3 mEq/L (ref 3.5–5.1)
Sodium: 138 mEq/L (ref 135–145)

## 2014-06-04 LAB — BRAIN NATRIURETIC PEPTIDE: Pro B Natriuretic peptide (BNP): 112 pg/mL — ABNORMAL HIGH (ref 0.0–100.0)

## 2014-06-05 ENCOUNTER — Other Ambulatory Visit: Payer: Self-pay | Admitting: *Deleted

## 2014-06-05 DIAGNOSIS — E1169 Type 2 diabetes mellitus with other specified complication: Secondary | ICD-10-CM | POA: Diagnosis not present

## 2014-06-05 DIAGNOSIS — I5031 Acute diastolic (congestive) heart failure: Secondary | ICD-10-CM

## 2014-06-05 DIAGNOSIS — I5032 Chronic diastolic (congestive) heart failure: Secondary | ICD-10-CM

## 2014-06-05 DIAGNOSIS — R0602 Shortness of breath: Secondary | ICD-10-CM

## 2014-06-05 DIAGNOSIS — E782 Mixed hyperlipidemia: Secondary | ICD-10-CM | POA: Diagnosis not present

## 2014-06-06 ENCOUNTER — Encounter (HOSPITAL_COMMUNITY): Payer: Medicare Other | Admitting: Certified Registered"

## 2014-06-06 ENCOUNTER — Ambulatory Visit (INDEPENDENT_AMBULATORY_CARE_PROVIDER_SITE_OTHER): Payer: Medicare Other | Admitting: Pharmacist

## 2014-06-06 ENCOUNTER — Ambulatory Visit (HOSPITAL_COMMUNITY)
Admission: RE | Admit: 2014-06-06 | Discharge: 2014-06-06 | Disposition: A | Discharge status: Home / Self Care | Payer: Medicare Other | Source: Ambulatory Visit | Attending: Cardiology | Admitting: Cardiology

## 2014-06-06 ENCOUNTER — Encounter (HOSPITAL_COMMUNITY): Payer: Self-pay | Admitting: *Deleted

## 2014-06-06 ENCOUNTER — Ambulatory Visit (HOSPITAL_COMMUNITY): Payer: Medicare Other | Admitting: Certified Registered"

## 2014-06-06 ENCOUNTER — Encounter (HOSPITAL_COMMUNITY)
Admission: RE | Disposition: A | Discharge status: Home / Self Care | Payer: Self-pay | Source: Ambulatory Visit | Attending: Cardiology

## 2014-06-06 DIAGNOSIS — I4891 Unspecified atrial fibrillation: Secondary | ICD-10-CM

## 2014-06-06 DIAGNOSIS — I70219 Atherosclerosis of native arteries of extremities with intermittent claudication, unspecified extremity: Secondary | ICD-10-CM | POA: Insufficient documentation

## 2014-06-06 DIAGNOSIS — I2510 Atherosclerotic heart disease of native coronary artery without angina pectoris: Secondary | ICD-10-CM | POA: Diagnosis not present

## 2014-06-06 DIAGNOSIS — Z951 Presence of aortocoronary bypass graft: Secondary | ICD-10-CM | POA: Diagnosis not present

## 2014-06-06 DIAGNOSIS — E785 Hyperlipidemia, unspecified: Secondary | ICD-10-CM | POA: Insufficient documentation

## 2014-06-06 DIAGNOSIS — I5032 Chronic diastolic (congestive) heart failure: Secondary | ICD-10-CM | POA: Insufficient documentation

## 2014-06-06 DIAGNOSIS — E119 Type 2 diabetes mellitus without complications: Secondary | ICD-10-CM | POA: Diagnosis not present

## 2014-06-06 DIAGNOSIS — Z87891 Personal history of nicotine dependence: Secondary | ICD-10-CM | POA: Insufficient documentation

## 2014-06-06 DIAGNOSIS — I509 Heart failure, unspecified: Secondary | ICD-10-CM | POA: Insufficient documentation

## 2014-06-06 DIAGNOSIS — I6529 Occlusion and stenosis of unspecified carotid artery: Secondary | ICD-10-CM | POA: Diagnosis not present

## 2014-06-06 DIAGNOSIS — Z954 Presence of other heart-valve replacement: Secondary | ICD-10-CM | POA: Insufficient documentation

## 2014-06-06 DIAGNOSIS — K219 Gastro-esophageal reflux disease without esophagitis: Secondary | ICD-10-CM | POA: Diagnosis not present

## 2014-06-06 DIAGNOSIS — I658 Occlusion and stenosis of other precerebral arteries: Secondary | ICD-10-CM | POA: Diagnosis not present

## 2014-06-06 DIAGNOSIS — Z7901 Long term (current) use of anticoagulants: Secondary | ICD-10-CM | POA: Insufficient documentation

## 2014-06-06 DIAGNOSIS — I10 Essential (primary) hypertension: Secondary | ICD-10-CM | POA: Diagnosis not present

## 2014-06-06 DIAGNOSIS — M109 Gout, unspecified: Secondary | ICD-10-CM | POA: Diagnosis not present

## 2014-06-06 HISTORY — PX: CARDIOVERSION: SHX1299

## 2014-06-06 LAB — POCT INR: INR: 2.6

## 2014-06-06 LAB — GLUCOSE, CAPILLARY
Glucose-Capillary: 105 mg/dL — ABNORMAL HIGH (ref 70–99)
Glucose-Capillary: 99 mg/dL (ref 70–99)

## 2014-06-06 SURGERY — CARDIOVERSION
Anesthesia: General

## 2014-06-06 MED ORDER — SODIUM CHLORIDE 0.9 % IV SOLN
INTRAVENOUS | Status: DC
  Administered 2014-06-06: 13:00:00 via INTRAVENOUS

## 2014-06-06 MED ORDER — PROPOFOL 10 MG/ML IV BOLUS
INTRAVENOUS | Status: DC | PRN
Start: 2014-06-06 — End: 2014-06-06
  Administered 2014-06-06: 70 mg via INTRAVENOUS

## 2014-06-06 MED ORDER — SODIUM CHLORIDE 0.9 % IV SOLN
INTRAVENOUS | Status: DC | PRN
  Administered 2014-06-06: 13:00:00 via INTRAVENOUS

## 2014-06-06 NOTE — H&P (View-Only) (Signed)
Patient ID: Nathaniel [name], male   DOB: [birth year], 80 y.o.   MRN: [id number] PCP: Dr. Elyse Hsu  80 yo with history of extensive vascular disease including CAD s/p CABG and redo CABG, AAA s/p repair, PAD, and carotid stenosis as well as prior AVR presents for cardiology followup.   He has had documentation of significant peripheral arterial disease.  He gets bilateral calf soreness after walking for about 15 minutes.  This resolves with rest, no rest pain.  No pedal ulcers, though when he gets cuts on his feet, they heal slowly.  Peripheral arterial dopplers done in 10/14 showed > 50% focal SFA stenoses bilaterally.  He has at least moderate right common carotid stenosis that may be nearing surgical range.  No stroke-like symptoms.  He had a recent endovascular AAA repair in 5/15.    After his AAA repair, he was noted to be in atrial fibrillation.  He had had a prior episode of atrial fibrillation after his cardiac surgery in 2007.  Atrial fibrillation was rate-controlled, and he remains in atrial fibrillation today.  He was started on warfarin and Toprol XL.  Initially, he did not appear to have any problems with atrial fibrillation in terms of dyspnea or fatigue.  We planned therapeutic anticoagulation x 1 month followed by cardioversion (can be as early as next week).  Today, he tells me that he has been feeling more fatigue.  He had 4 days about a week ago that were quite difficult with orthopnea and exertional dyspnea.  He says that he had been eating a high sodium diet during that period.  Currently, he is feeling better, but he still gets short of breath (mildly) after walking about 5 minutes.  No chest pain.  Currently no orthopnea.  He has been playing golf.   ECG: atrial fibrillation with rightward axis, IVCD, lateral T wave flattening   Labs (5/15): K 4.3, creatinine 0.89  PMH: 1. PAD: Peripheral arterial dopplers in 2012 showed > 75% bilateral SFA stenosis. Peripheral arterial dopplers in 10/14  showed > 50% focal bilateral SFA stenoses.  2. AAA: [place] 4/15 with 4.4 cm AAA but concern for penetrating ulcers. CTA abdomen showed 4.4 cm AAA with penetrating ulcers and possible pseudoaneurysms. Now s/p endovascular repair of AAA in 6/01 with no complications.  3. Carotid stenosis: Carotid dopplers (4/15) with 60-79% bilateral ICA stenosis, RICA may be near surgical range.  4. CAD: CABG 1989 with LIMA-LAD, SVG-D, seq SVG-ramus and OM, sequential SVG-PDA/PLV. SVG-PDA/PLV found to be occluded on cath prior to AVR, so patient had SVG-PDA with AVR in 2007.  5. Severe aortic stenosis: Bioprosthetic AVR in 2007. Echo (11/10) with EF 60-65%, basal to mid inferior hypokinesis, bioprosthetic aortic valve without significant stenosis/regurgitation, mild MR. Echo (11/14) with EF 55-60%, mild LVH, bioprosthetic aortic valve looked ok.  6. Atrial fibrillation: Paroxysmal. Initially noted after cardiac surgery, then again after AAA repair.   7. Type II diabetes  8. Gout  9. GERD  10. Hyperlipidemia  11. HTN 12. GERD   SH: Widower, 2 children, raised his 3 grand-daughters, lives in [place], [place].  Occasional ETOH, no smoking.   FH: Father died from ruptured AAA.   ROS: All systems reviewed and negative except as per HPI.   Current Outpatient Prescriptions  Medication Sig Dispense Refill  . ACTOS 30 MG tablet Take 30 mg by mouth daily.       Marland Kitchen allopurinol (ZYLOPRIM) 100 MG tablet Take 100 mg by mouth daily.        Marland Kitchen  atorvastatin (LIPITOR) 20 MG tablet Take 20 mg by mouth daily.      . beta carotene w/minerals (OCUVITE) tablet Take 1 tablet by mouth daily.      . Coenzyme Q10 (CO Q 10) 100 MG CAPS Take 1 capsule by mouth daily.      Marland Kitchen doxazosin (CARDURA) 1 MG tablet Take 2 in the am and 1 in the pm      . glucosamine-chondroitin 500-400 MG tablet Take 1 tablet by mouth daily.      Marland Kitchen LOVAZA 1 G capsule Take 1 g by mouth 3 (three) times daily.       . metoprolol succinate (TOPROL-XL) 25 MG 24 hr  tablet Take 1 tablet (25 mg total) by mouth 2 (two) times daily.  60 tablet  1  . ramipril (ALTACE) 5 MG capsule TAKE ONE CAPSULE EACH DAY  90 capsule  2  . warfarin (COUMADIN) 3 MG tablet Take as directed by anticoagulation clinic  70 tablet  1  . ZETIA 10 MG tablet Take 10 mg by mouth daily.       . furosemide (LASIX) 40 MG tablet Take 1 tablet (40 mg total) by mouth daily.  30 tablet  3  . potassium chloride SA (K-DUR,KLOR-CON) 20 MEQ tablet Take 1 tablet (20 mEq total) by mouth daily.  30 tablet  3   No current facility-administered medications for this visit.    BP 124/82  Pulse 97  Ht 5' 10.5" (1.791 m)  Wt 89.359 kg (197 lb)  BMI 27.86 kg/m2 General: NAD Neck: JVP 9-10 cm, no thyromegaly or thyroid nodule.  Lungs: Clear to auscultation bilaterally with normal respiratory effort. CV: Nondisplaced PMI.  Heart irregular S1/S2, no S3/S4, 2/6 SEM RUSB.  1+ edema 1/2 up lower legs bilaterally.  Soft right carotid bruit.  1+ left PT, unable to feel right PT.   Abdomen: Soft, nontender, no hepatosplenomegaly, no distention.  Skin: Intact without lesions or rashes.  Neurologic: Alert and oriented x 3.  Psych: Normal affect. Extremities: No clubbing or cyanosis.   Assessment/Plan:  1. CAD: s/p CABG and redo CABG.  Stable with no ischemic symptoms.  Continue statin, ramipril.  He is on warfarin in setting of stable CAD so not on ASA 81.  2. Hyperlipidemia: He is on atorvastatin, Zetia, and Lovaza. He should have aggressive control given extensive vascular disease.  I would like LDL < 70.  3. PAD: Patient has moderate claudication symptoms.  No rest symptoms or foot ulcers. Peripheral arterial dopplers showed > 50% bilateral focal SFA stenoses.  I have talked to the patient about a trial of cilostazol +/- PV referral.  At this time, he does not want either.  We have talked about trying to walk through the pain for a short distance as this may help encourage collaterals.  Symptoms have been  stable and is now followed by [facility].  4. Carotid stenosis: Most recent carotid dopplers were concerning for RICA stenosis nearing surgical range.  He has had no stroke-like symptoms.  He has seen Dr. Trula Slade and will be getting carotid dopplers in followup at [facility].  5. AAA: Successful endovascular repair in 5/15.  6. Bioprosthetic AVR: Valve stable on 11/14 echo.  7. Atrial fibrillation: He has developed recurrent atrial fibrillation post-op AAA repair that appear to be persistent.  Given the bioprosthetic aortic valve and his CAD, I ended up thinking that warfarin is a better choice than NOAC (he is now on warfarin).  He now  has some CHF symptoms that I think are related to going into atrial fibrillation.   - INR will have been therapeutic 4 weeks by next week.  I am going to arrange for DCCV next week.  If INR happens to be subtherapeutic at last check next week, would arrange for TEE-guided DCCV given symptoms. - Continue Toprol XL.   - If he does not convert next week, will need to load with either amiodarone or Tikosyn and retry.  Would like to get him out of atrial fibrillation given symptoms.  8. Chronic diastolic CHF: Patient appears volume overloaded now with NYHA class II-III symptoms.   - I will get echo to make sure that EF remains normal.  - Start Lasix 40 mg daily + KCl 20 mEq daily.  - BMET/BNP in 10 days.  - Needs to follow low sodium diet.  - Mr Goettel is on Actos.  Given volume overload, this needs to be changed.  He is going to get in touch with Dr. Elyse Hsu to get a different medication for his diabetes.     [name] 05/25/2014

## 2014-06-06 NOTE — Anesthesia Postprocedure Evaluation (Signed)
  Anesthesia Post-op Note  Patient: Nathaniel [name]  Procedure(s) Performed: Procedure(s): CARDIOVERSION (N/A)  Patient Location: Endoscopy Unit  Anesthesia Type:General  Level of Consciousness: awake, alert , oriented and patient cooperative  Airway and Oxygen Therapy: Patient Spontanous Breathing and Patient connected to nasal cannula oxygen  Post-op Pain: none  Post-op Assessment: Post-op Vital signs reviewed, Patient's Cardiovascular Status Stable, Respiratory Function Stable, Patent Airway, No signs of Nausea or vomiting, No headache, No backache, No residual numbness and No residual motor weakness  Post-op Vital Signs: Reviewed and stable  Last Vitals:  Filed Vitals:   06/06/14 1343  BP:   Pulse: 62  Temp:   Resp: 20    Complications: No apparent anesthesia complications

## 2014-06-06 NOTE — Transfer of Care (Signed)
Immediate Anesthesia Transfer of Care Note  Patient: Nathaniel [name]  Procedure(s) Performed: Procedure(s): CARDIOVERSION (N/A)  Patient Location: Endoscopy Unit  Anesthesia Type:General  Level of Consciousness: awake, alert , oriented and patient cooperative  Airway & Oxygen Therapy: Patient Spontanous Breathing and Patient connected to nasal cannula oxygen  Post-op Assessment: Report given to PACU RN, Post -op Vital signs reviewed and stable and Patient moving all extremities X 4  Post vital signs: Reviewed and stable  Complications: No apparent anesthesia complications

## 2014-06-06 NOTE — Procedures (Signed)
Electrical Cardioversion Procedure Note Nathaniel [name] [id number] [birth year]  Procedure: Electrical Cardioversion Indications:  Atrial Fibrillation  Procedure Details Consent: Risks of procedure as well as the alternatives and risks of each were explained to the (patient/caregiver).  Consent for procedure obtained. Time Out: Verified patient identification, verified procedure, site/side was marked, verified correct patient position, special equipment/implants available, medications/allergies/relevent history reviewed, required imaging and test results available.  Performed  Patient placed on cardiac monitor, pulse oximetry, supplemental oxygen as necessary.  Sedation given: Propofol Pacer pads placed anterior and posterior chest.  Cardioverted 1 time(s).  Cardioverted at [place].  Evaluation Findings: Post procedure EKG shows: NSR Complications: None Patient did tolerate procedure well.   Nathaniel [name] 06/06/2014, 1:35 PM

## 2014-06-06 NOTE — Interval H&P Note (Signed)
History and Physical Interval Note:  06/06/2014 1:27 PM  Nathaniel [name]  has presented today for surgery, with the diagnosis of afib  The various methods of treatment have been discussed with the patient and family. After consideration of risks, benefits and other options for treatment, the patient has consented to  Procedure(s): CARDIOVERSION (N/A) as a surgical intervention .  The patient's history has been reviewed, patient examined, no change in status, stable for surgery.  I have reviewed the patient's chart and labs.  Questions were answered to the patient's satisfaction.     Dal[place] [employer]

## 2014-06-06 NOTE — Discharge Instructions (Signed)
Electrical Cardioversion, Care After Refer to this sheet in the next few weeks. These instructions provide you with information on caring for yourself after your procedure. Your health care provider may also give you more specific instructions. Your treatment has been planned according to current medical practices, but problems sometimes occur. Call your health care provider if you have any problems or questions after your procedure. WHAT TO EXPECT AFTER THE PROCEDURE After your procedure, it is typical to have the following sensations:  Some redness on the skin where the shocks were delivered. If this is tender, a sunburn lotion or hydrocortisone cream may help.  Possible return of an abnormal heart rhythm within hours or days after the procedure. HOME CARE INSTRUCTIONS  Only take medicine as directed by your health care provider. Be sure you understand how and when to take your medicine.  Learn how to feel your pulse and check it often.  Limit your activity for 48 hours after the procedure or as directed.  Avoid or minimize caffeine and other stimulants as directed. SEEK MEDICAL CARE IF:  You feel like your heart is beating too fast or your pulse is not regular.  You have any questions about your medicines.  You have bleeding that will not stop. SEEK IMMEDIATE MEDICAL CARE IF:  You are dizzy or feel faint.  It is hard to breathe or you feel short of breath.  There is a change in discomfort in your chest.  Your speech is slurred or you have trouble moving an arm or leg on one side of your body.  You get a serious muscle cramp that does not go away.  Your fingers or toes turn cold or blue. MAKE SURE YOU:   Understand these instructions.   Will watch your condition.   Will get help right away if you are not doing well or get worse. Document Released: 09/20/2013 Document Reviewed: 09/20/2013 [facility] Patient Information 2015 [place], [place]. This information is not  intended to replace advice given to you by your health care provider. Make sure you discuss any questions you have with your health care provider.

## 2014-06-06 NOTE — Anesthesia Preprocedure Evaluation (Addendum)
Anesthesia Evaluation  Patient identified by MRN, date of birth, ID band Patient awake    Reviewed: Allergy & Precautions, H&P , NPO status , Patient's Chart, lab work & pertinent test results  History of Anesthesia Complications Negative for: history of anesthetic complications  Airway Mallampati: II TM Distance: >3 FB Neck ROM: Full    Dental  (+) Teeth Intact, Dental Advisory Given   Pulmonary former smoker,          Cardiovascular hypertension, Pt. on medications and Pt. on home beta blockers + CAD, + Peripheral Vascular Disease and +CHF     Neuro/Psych negative neurological ROS  negative psych ROS   GI/Hepatic Neg liver ROS, GERD-  Medicated and Controlled,  Endo/Other  diabetes, Type 2  Renal/GU negative Renal ROS     Musculoskeletal   Abdominal   Peds  Hematology   Anesthesia Other Findings   Reproductive/Obstetrics                         Anesthesia Physical Anesthesia Plan  ASA: III  Anesthesia Plan: General   Post-op Pain Management:    Induction: Intravenous  Airway Management Planned: Mask  Additional Equipment:   Intra-op Plan:   Post-operative Plan:   Informed Consent:   Dental advisory given  Plan Discussed with: CRNA, Anesthesiologist and Surgeon  Anesthesia Plan Comments:        Anesthesia Quick Evaluation

## 2014-06-07 ENCOUNTER — Encounter (HOSPITAL_COMMUNITY): Payer: Self-pay | Admitting: Cardiology

## 2014-06-08 DIAGNOSIS — I10 Essential (primary) hypertension: Secondary | ICD-10-CM | POA: Diagnosis not present

## 2014-06-08 DIAGNOSIS — I2510 Atherosclerotic heart disease of native coronary artery without angina pectoris: Secondary | ICD-10-CM | POA: Diagnosis not present

## 2014-06-08 DIAGNOSIS — M109 Gout, unspecified: Secondary | ICD-10-CM | POA: Diagnosis not present

## 2014-06-08 DIAGNOSIS — E782 Mixed hyperlipidemia: Secondary | ICD-10-CM | POA: Diagnosis not present

## 2014-06-08 DIAGNOSIS — E1169 Type 2 diabetes mellitus with other specified complication: Secondary | ICD-10-CM | POA: Diagnosis not present

## 2014-06-11 ENCOUNTER — Other Ambulatory Visit (HOSPITAL_COMMUNITY): Payer: Self-pay | Admitting: Cardiology

## 2014-06-11 ENCOUNTER — Ambulatory Visit (HOSPITAL_COMMUNITY)
Admit: 2014-06-11 | Discharge: 2014-06-11 | Disposition: A | Discharge status: Home / Self Care | Payer: Medicare Other | Source: Ambulatory Visit | Attending: Cardiology | Admitting: Cardiology

## 2014-06-11 ENCOUNTER — Other Ambulatory Visit: Payer: Self-pay | Admitting: *Deleted

## 2014-06-11 ENCOUNTER — Encounter: Payer: Self-pay | Admitting: *Deleted

## 2014-06-11 DIAGNOSIS — I4891 Unspecified atrial fibrillation: Secondary | ICD-10-CM | POA: Insufficient documentation

## 2014-06-11 DIAGNOSIS — I482 Chronic atrial fibrillation, unspecified: Secondary | ICD-10-CM

## 2014-06-11 DIAGNOSIS — Z952 Presence of prosthetic heart valve: Secondary | ICD-10-CM

## 2014-06-11 DIAGNOSIS — I369 Nonrheumatic tricuspid valve disorder, unspecified: Secondary | ICD-10-CM | POA: Diagnosis not present

## 2014-06-11 DIAGNOSIS — I480 Paroxysmal atrial fibrillation: Secondary | ICD-10-CM

## 2014-06-11 DIAGNOSIS — Z954 Presence of other heart-valve replacement: Secondary | ICD-10-CM | POA: Insufficient documentation

## 2014-06-11 NOTE — Progress Notes (Signed)
2D Echocardiogram Complete.  06/11/2014   Bethany Shickley, RDCS

## 2014-06-12 ENCOUNTER — Other Ambulatory Visit: Payer: Medicare Other

## 2014-06-12 ENCOUNTER — Encounter: Payer: Self-pay | Admitting: *Deleted

## 2014-06-12 ENCOUNTER — Ambulatory Visit (INDEPENDENT_AMBULATORY_CARE_PROVIDER_SITE_OTHER): Payer: Medicare Other | Admitting: Pharmacist

## 2014-06-12 ENCOUNTER — Encounter: Payer: Self-pay | Admitting: Cardiology

## 2014-06-12 ENCOUNTER — Ambulatory Visit (INDEPENDENT_AMBULATORY_CARE_PROVIDER_SITE_OTHER): Payer: Medicare Other | Admitting: Cardiology

## 2014-06-12 VITALS — BP 126/66 | HR 57 | Ht 70.5 in | Wt 195.0 lb

## 2014-06-12 DIAGNOSIS — I739 Peripheral vascular disease, unspecified: Secondary | ICD-10-CM

## 2014-06-12 DIAGNOSIS — I5042 Chronic combined systolic (congestive) and diastolic (congestive) heart failure: Secondary | ICD-10-CM | POA: Insufficient documentation

## 2014-06-12 DIAGNOSIS — I25810 Atherosclerosis of coronary artery bypass graft(s) without angina pectoris: Secondary | ICD-10-CM | POA: Diagnosis not present

## 2014-06-12 DIAGNOSIS — I5022 Chronic systolic (congestive) heart failure: Secondary | ICD-10-CM

## 2014-06-12 DIAGNOSIS — I714 Abdominal aortic aneurysm, without rupture, unspecified: Secondary | ICD-10-CM

## 2014-06-12 DIAGNOSIS — I4891 Unspecified atrial fibrillation: Secondary | ICD-10-CM

## 2014-06-12 DIAGNOSIS — I5032 Chronic diastolic (congestive) heart failure: Secondary | ICD-10-CM | POA: Diagnosis not present

## 2014-06-12 DIAGNOSIS — I5031 Acute diastolic (congestive) heart failure: Secondary | ICD-10-CM

## 2014-06-12 DIAGNOSIS — R0602 Shortness of breath: Secondary | ICD-10-CM

## 2014-06-12 DIAGNOSIS — I509 Heart failure, unspecified: Secondary | ICD-10-CM

## 2014-06-12 DIAGNOSIS — I480 Paroxysmal atrial fibrillation: Secondary | ICD-10-CM

## 2014-06-12 LAB — POCT INR: INR: 4.2

## 2014-06-12 MED ORDER — FUROSEMIDE 40 MG PO TABS: ORAL_TABLET | ORAL | Status: DC

## 2014-06-12 MED ORDER — SPIRONOLACTONE 25 MG PO TABS: ORAL_TABLET | ORAL | Status: DC

## 2014-06-12 NOTE — Progress Notes (Signed)
Patient ID: Nathaniel [name], male   DOB: [birth year], 80 y.o.   MRN: [id number] PCP: Dr. Elyse Hsu  80 yo with history of extensive vascular disease including CAD s/p CABG and redo CABG, AAA s/p repair, PAD, and carotid stenosis as well as prior AVR presents for cardiology followup.   He has had documentation of significant peripheral arterial disease.  He gets bilateral calf soreness after walking for about 15 minutes.  This resolves with rest, no rest pain.  No pedal ulcers, though when he gets cuts on his feet, they heal slowly.  Peripheral arterial dopplers done in 10/14 showed > 50% focal SFA stenoses bilaterally.  He has at least moderate right common carotid stenosis that may be nearing surgical range.  No stroke-like symptoms.  He had a recent endovascular AAA repair in 5/15.    After his AAA repair, he was noted to be in atrial fibrillation.  He had had a prior episode of atrial fibrillation after his cardiac surgery in 2007.  Atrial fibrillation was rate-controlled.  He was started on warfarin and Toprol XL.  He developed exertional dyspnea and fatigue in atrial fibrillation. I cardioverted him back to NSR on 06/06/14.  I also had him get an echocardiogram in 6/15.  This showed EF worsened to 25-30% with stable bioprosthetic aortic valve, mildly dilated RV with normal systolic function.    Today, he remains in NSR.  He is feeling good overall, better than when he was in atrial fibrillation.  He is currently taking Lasix 40 mg every other day.  He was able to play 18 holes of golf yesterday with no problems.  He did not get tired until the 17th hole.  He has bendopnea.  He is short of breath with showering/drying off.  He has minimal dyspnea now with walking.  No chest pain.  No orthopnea/PND. Weight is down 2 lbs. He is now off Actos.   ECG: NSR, right axis deviation, incomplete LBBB   Labs (5/15): K 4.3, creatinine 0.89 Labs (6/15): K 4.3, creatinine 1.4, BNP 112  PMH: 1. PAD: Peripheral arterial  dopplers in 2012 showed > 75% bilateral SFA stenosis. Peripheral arterial dopplers in 10/14 showed > 50% focal bilateral SFA stenoses.  2. AAA: [place] 4/15 with 4.4 cm AAA but concern for penetrating ulcers. CTA abdomen showed 4.4 cm AAA with penetrating ulcers and possible pseudoaneurysms. Now s/p endovascular repair of AAA in 5/32 with no complications.  3. Carotid stenosis: Carotid dopplers (4/15) with 60-79% bilateral ICA stenosis, RICA may be near surgical range.  4. CAD: CABG 1989 with LIMA-LAD, SVG-D, seq SVG-ramus and OM, sequential SVG-PDA/PLV. SVG-PDA/PLV found to be occluded on cath prior to AVR, so patient had SVG-PDA with AVR in 2007.  5. Severe aortic stenosis: Bioprosthetic AVR in 2007. Echo (11/10) with EF 60-65%, basal to mid inferior hypokinesis, bioprosthetic aortic valve without significant stenosis/regurgitation, mild MR. Echo (11/14) with EF 55-60%, mild LVH, bioprosthetic aortic valve looked ok.  6. Atrial fibrillation: Paroxysmal. Initially noted after cardiac surgery, then again after AAA repair.  DCCV to NSR 06/06/14.  7. Type II diabetes  8. Gout  9. GERD  10. Hyperlipidemia  11. HTN 12. GERD  13. Chronic systolic CHF: Echo (9/92) with EF worsened to 25-30% with grade II diastolic dysfunction, stable bioprosthetic aortic valve, mildly dilated RV with normal systolic function.     SH: Widower, 2 children, raised his 3 grand-daughters, lives in [place], [place].  Occasional ETOH, no smoking.   FH: Father died  from ruptured AAA.   ROS: All systems reviewed and negative except as per HPI.   Current Outpatient Prescriptions  Medication Sig Dispense Refill  . allopurinol (ZYLOPRIM) 100 MG tablet Take 100 mg by mouth daily.        Marland Kitchen atorvastatin (LIPITOR) 20 MG tablet Take 20 mg by mouth daily.      . beta carotene w/minerals (OCUVITE) tablet Take 1 tablet by mouth daily.      . Coenzyme Q10 (CO Q 10) 100 MG CAPS Take 100 mg by mouth daily.       Marland Kitchen doxazosin (CARDURA)  1 MG tablet Take 1-2 mg by mouth 2 (two) times daily. Take1 tablets in the morning and take 2 tablet in the evening      . furosemide (LASIX) 40 MG tablet 40mg  alternating with 20mg  daily  45 tablet  3  . glucosamine-chondroitin 500-400 MG tablet Take 1 tablet by mouth daily.      . metoprolol succinate (TOPROL-XL) 25 MG 24 hr tablet Take 1 tablet (25 mg total) by mouth 2 (two) times daily.  60 tablet  1  . Omega-3 Fatty Acids (FISH OIL) 1000 MG CAPS Take 1,000-2,000 mg by mouth 2 (two) times daily. Takes 1 in the morning and 2 in the evening      . ramipril (ALTACE) 5 MG capsule Take 5 mg by mouth daily.      Marland Kitchen warfarin (COUMADIN) 3 MG tablet Take 6-9 mg by mouth See admin instructions. Take 2 tablets all days except take 3 tablets on Tuesday and Saturday      . ZETIA 10 MG tablet Take 10 mg by mouth daily.       Marland Kitchen spironolactone (ALDACTONE) 25 MG tablet 1 /2 tablet (12.5mg ) daily  15 tablet  3   No current facility-administered medications for this visit.    BP 126/66  Pulse 57  Ht 5' 10.5" (1.791 m)  Wt 195 lb (88.451 kg)  BMI 27.57 kg/m2 General: NAD Neck: JVP 8 cm, no thyromegaly or thyroid nodule.  Lungs: Clear to auscultation bilaterally with normal respiratory effort. CV: Nondisplaced PMI.  Heart regular S1/S2, no S3/S4, 2/6 SEM RUSB.  1+ankle edema bilaterally.  Soft right carotid bruit.  1+ left PT, unable to feel right PT.   Abdomen: Soft, nontender, no hepatosplenomegaly, no distention. Ventral hernia.  Skin: Intact without lesions or rashes.  Neurologic: Alert and oriented x 3.  Psych: Normal affect. Extremities: No clubbing or cyanosis.   Assessment/Plan:  1. CAD: s/p CABG and redo CABG.  No chest pain.  I am concerned, however, for worsening of CAD given fall in EF to 25-30%.  This coincides with atrial fibrillation onset, but he has not been tachycardic, so do not think this could be blamed on tachy-mediated CMP. - Given significant fall in EF, I am going to arrange for  cardiac cath.  As symptoms are stable to improved and he has had no chest pain, I will wait for 4 wks post-cardioversion before interrupting anticoagulation for LHC.  He will hold warfarin 4 days prior to cath. - Continue statin, ramipril.   - He is on warfarin in setting of stable CAD so not on ASA 81.  2. Hyperlipidemia: He is on atorvastatin, Zetia, and Lovaza. He should have aggressive control given extensive vascular disease.  I will check lipids with next labs.  3. PAD: Patient has moderate claudication symptoms.  No rest symptoms or foot ulcers. Peripheral arterial dopplers showed > 50%  bilateral focal SFA stenoses.  I have talked to the patient about a trial of cilostazol +/- PV referral.  At the time, he did not want either.  We have talked about trying to walk through the pain for a short distance as this may help encourage collaterals.  Symptoms have been stable and is now followed by [facility].  4. Carotid stenosis: Most recent carotid dopplers were concerning for RICA stenosis nearing surgical range.  He has had no stroke-like symptoms.  He has seen Dr. Trula Slade and will be getting carotid dopplers in followup at [facility].  5. AAA: Successful endovascular repair in 5/15.  6. Bioprosthetic AVR: Valve stable on 6/15 echo.  7. Atrial fibrillation: He developed recurrent atrial fibrillation post-op AAA repair.  Given the bioprosthetic aortic valve and his CAD, I ended up thinking that warfarin is a better choice than NOAC (he is now on warfarin).  He is status post DCCV and feels much better symptomatically now that he is in NSR.  - If atrial fibrillation recurs, would plan loading with either amiodarone or Tikosyn followed by repeat cardioversion given his symptoms with atrial fibrillation.  - Continue Toprol XL.   8. Chronic systolic CHF: EF now 46-65%, was normal on prior echo in 11/14.  As his atrial fibrillation was not persistently fast, I cannot blame this on tachycardia-mediated cardiomyopathy.  I am  concerned for worsening CAD.  NYHA class II-III symptoms now, improved.  He still has some volume overload.  - Increase Lasix to 40 daily alternating with 20 daily. - Continue Toprol XL and ramipril at current doses.  - Add spironolactone 12.5 mg daily and stop KCl.  - Start Lasix 40 mg daily + KCl 20 mEq daily.  - BMET/BNP in 10 days.  - Needs to follow low sodium diet.  - LHC as above to assess for worsening of coronary disease.    [name] 06/12/2014

## 2014-06-12 NOTE — Patient Instructions (Addendum)
Take lasix(furosmide) 40mg  alternating with 20mg  daily.  This will be 1 of your 40mg  tablets one day and 1/2 of your 40mg  tablets the next day.  Stop KCL(potassium).  Start spironolactone 12.5mg  daily. This will be 1/2 of a 25mg  tablet daily.  Your physician recommends that you return for lab work in: 73 days--BMET/BNP.  Your physician recommends that you return for lab work on July 22 or July 23--BMET/CBCd  Your physician has requested that you have a cardiac catheterization. Cardiac catheterization is used to diagnose and/or treat various heart conditions. Doctors may recommend this procedure for a number of different reasons. The most common reason is to evaluate chest pain. Chest pain can be a symptom of coronary artery disease (CAD), and cardiac catheterization can show whether plaque is narrowing or blocking your heart's arteries. This procedure is also used to evaluate the valves, as well as measure the blood flow and oxygen levels in different parts of your heart. For further information please visit [email or web address]. Please follow instruction sheet, as given. Tuesday July 28,2015 Have your pro-time checked the morning of the catheterization before you go to the hospital.   Your physician recommends that you schedule a follow-up appointment the week of August 3,2015.

## 2014-06-14 NOTE — Addendum Note (Signed)
Addended by: Katrine Coho on: 06/14/2014 07:55 AM   Modules accepted: Orders

## 2014-06-22 ENCOUNTER — Other Ambulatory Visit: Payer: Medicare Other

## 2014-06-22 ENCOUNTER — Ambulatory Visit (INDEPENDENT_AMBULATORY_CARE_PROVIDER_SITE_OTHER): Payer: Medicare Other | Admitting: *Deleted

## 2014-06-22 DIAGNOSIS — I25810 Atherosclerosis of coronary artery bypass graft(s) without angina pectoris: Secondary | ICD-10-CM

## 2014-06-22 DIAGNOSIS — I5032 Chronic diastolic (congestive) heart failure: Secondary | ICD-10-CM | POA: Diagnosis not present

## 2014-06-22 DIAGNOSIS — I4891 Unspecified atrial fibrillation: Secondary | ICD-10-CM

## 2014-06-22 DIAGNOSIS — R0602 Shortness of breath: Secondary | ICD-10-CM | POA: Diagnosis not present

## 2014-06-22 DIAGNOSIS — I480 Paroxysmal atrial fibrillation: Secondary | ICD-10-CM

## 2014-06-22 DIAGNOSIS — I5031 Acute diastolic (congestive) heart failure: Secondary | ICD-10-CM

## 2014-06-22 LAB — POCT INR: INR: 2.4

## 2014-06-22 LAB — BASIC METABOLIC PANEL
BUN: 19 mg/dL (ref 6–23)
CALCIUM: 8.8 mg/dL (ref 8.4–10.5)
CO2: 24 meq/L (ref 19–32)
CREATININE: 1.2 mg/dL (ref 0.4–1.5)
Chloride: 102 mEq/L (ref 96–112)
GFR: 62.48 mL/min (ref >60.00)
GLUCOSE: 155 mg/dL — AB (ref 70–99)
Potassium: 4.2 mEq/L (ref 3.5–5.1)
Sodium: 136 mEq/L (ref 135–145)

## 2014-06-22 LAB — BRAIN NATRIURETIC PEPTIDE: Pro B Natriuretic peptide (BNP): 256 pg/mL — ABNORMAL HIGH (ref 0.0–100.0)

## 2014-07-03 ENCOUNTER — Encounter (HOSPITAL_COMMUNITY): Payer: Self-pay | Admitting: Pharmacy Technician

## 2014-07-04 ENCOUNTER — Other Ambulatory Visit: Payer: Self-pay | Admitting: Cardiology

## 2014-07-05 ENCOUNTER — Other Ambulatory Visit (INDEPENDENT_AMBULATORY_CARE_PROVIDER_SITE_OTHER): Payer: Medicare Other

## 2014-07-05 DIAGNOSIS — R0602 Shortness of breath: Secondary | ICD-10-CM

## 2014-07-05 DIAGNOSIS — I480 Paroxysmal atrial fibrillation: Secondary | ICD-10-CM

## 2014-07-05 DIAGNOSIS — I5031 Acute diastolic (congestive) heart failure: Secondary | ICD-10-CM | POA: Diagnosis not present

## 2014-07-05 DIAGNOSIS — I25810 Atherosclerosis of coronary artery bypass graft(s) without angina pectoris: Secondary | ICD-10-CM

## 2014-07-05 DIAGNOSIS — I5032 Chronic diastolic (congestive) heart failure: Secondary | ICD-10-CM | POA: Diagnosis not present

## 2014-07-05 DIAGNOSIS — I4891 Unspecified atrial fibrillation: Secondary | ICD-10-CM

## 2014-07-05 LAB — BASIC METABOLIC PANEL
BUN: 26 mg/dL — AB (ref 6–23)
CALCIUM: 8.8 mg/dL (ref 8.4–10.5)
CO2: 28 mEq/L (ref 19–32)
Chloride: 105 mEq/L (ref 96–112)
Creatinine, Ser: 1.2 mg/dL (ref 0.4–1.5)
GFR: 61.88 mL/min (ref >60.00)
GLUCOSE: 147 mg/dL — AB (ref 70–99)
POTASSIUM: 4.4 meq/L (ref 3.5–5.1)
Sodium: 137 mEq/L (ref 135–145)

## 2014-07-05 LAB — CBC WITH DIFFERENTIAL/PLATELET
BASOS ABS: 0 10*3/uL (ref 0.0–0.1)
Basophils Relative: 0.3 % (ref 0.0–3.0)
EOS ABS: 0.2 10*3/uL (ref 0.0–0.7)
Eosinophils Relative: 2.4 % (ref 0.0–5.0)
HEMATOCRIT: 40.2 % (ref 39.0–52.0)
Hemoglobin: 13.5 g/dL (ref 13.0–17.0)
LYMPHS ABS: 1.1 10*3/uL (ref 0.7–4.0)
Lymphocytes Relative: 17.8 % (ref 12.0–46.0)
MCHC: 33.6 g/dL (ref 30.0–36.0)
MCV: 87.7 fl (ref 78.0–100.0)
Monocytes Absolute: 0.5 10*3/uL (ref 0.1–1.0)
Monocytes Relative: 7.4 % (ref 3.0–12.0)
Neutro Abs: 4.7 10*3/uL (ref 1.4–7.7)
Neutrophils Relative %: 72.1 % (ref 43.0–77.0)
PLATELETS: 186 10*3/uL (ref 150.0–400.0)
RBC: 4.58 Mil/uL (ref 4.22–5.81)
RDW: 14.9 % (ref 11.5–15.5)
WBC: 6.4 10*3/uL (ref 4.0–10.5)

## 2014-07-10 ENCOUNTER — Encounter (HOSPITAL_COMMUNITY)
Admission: RE | Disposition: A | Discharge status: Home / Self Care | Payer: Self-pay | Source: Ambulatory Visit | Attending: Cardiology

## 2014-07-10 ENCOUNTER — Other Ambulatory Visit (HOSPITAL_COMMUNITY): Payer: Self-pay | Admitting: Cardiology

## 2014-07-10 ENCOUNTER — Ambulatory Visit (HOSPITAL_COMMUNITY)
Admission: RE | Admit: 2014-07-10 | Discharge: 2014-07-10 | Disposition: A | Discharge status: Home / Self Care | Payer: Medicare Other | Source: Ambulatory Visit | Attending: Cardiology | Admitting: Cardiology

## 2014-07-10 ENCOUNTER — Ambulatory Visit (INDEPENDENT_AMBULATORY_CARE_PROVIDER_SITE_OTHER): Payer: Medicare Other | Admitting: *Deleted

## 2014-07-10 DIAGNOSIS — I10 Essential (primary) hypertension: Secondary | ICD-10-CM | POA: Insufficient documentation

## 2014-07-10 DIAGNOSIS — I70219 Atherosclerosis of native arteries of extremities with intermittent claudication, unspecified extremity: Secondary | ICD-10-CM | POA: Diagnosis not present

## 2014-07-10 DIAGNOSIS — I2589 Other forms of chronic ischemic heart disease: Secondary | ICD-10-CM | POA: Diagnosis not present

## 2014-07-10 DIAGNOSIS — I6529 Occlusion and stenosis of unspecified carotid artery: Secondary | ICD-10-CM | POA: Diagnosis not present

## 2014-07-10 DIAGNOSIS — K219 Gastro-esophageal reflux disease without esophagitis: Secondary | ICD-10-CM | POA: Insufficient documentation

## 2014-07-10 DIAGNOSIS — I4891 Unspecified atrial fibrillation: Secondary | ICD-10-CM | POA: Diagnosis not present

## 2014-07-10 DIAGNOSIS — E119 Type 2 diabetes mellitus without complications: Secondary | ICD-10-CM | POA: Insufficient documentation

## 2014-07-10 DIAGNOSIS — I509 Heart failure, unspecified: Secondary | ICD-10-CM | POA: Diagnosis not present

## 2014-07-10 DIAGNOSIS — I255 Ischemic cardiomyopathy: Secondary | ICD-10-CM

## 2014-07-10 DIAGNOSIS — Z7901 Long term (current) use of anticoagulants: Secondary | ICD-10-CM | POA: Diagnosis not present

## 2014-07-10 DIAGNOSIS — M109 Gout, unspecified: Secondary | ICD-10-CM | POA: Diagnosis not present

## 2014-07-10 DIAGNOSIS — I2510 Atherosclerotic heart disease of native coronary artery without angina pectoris: Secondary | ICD-10-CM | POA: Insufficient documentation

## 2014-07-10 DIAGNOSIS — Z951 Presence of aortocoronary bypass graft: Secondary | ICD-10-CM | POA: Diagnosis not present

## 2014-07-10 DIAGNOSIS — I5022 Chronic systolic (congestive) heart failure: Secondary | ICD-10-CM | POA: Diagnosis not present

## 2014-07-10 DIAGNOSIS — Z954 Presence of other heart-valve replacement: Secondary | ICD-10-CM | POA: Insufficient documentation

## 2014-07-10 DIAGNOSIS — E785 Hyperlipidemia, unspecified: Secondary | ICD-10-CM | POA: Insufficient documentation

## 2014-07-10 HISTORY — PX: LEFT HEART CATHETERIZATION WITH CORONARY/GRAFT ANGIOGRAM: SHX5450

## 2014-07-10 LAB — GLUCOSE, CAPILLARY
GLUCOSE-CAPILLARY: 122 mg/dL — AB (ref 70–99)
Glucose-Capillary: 112 mg/dL — ABNORMAL HIGH (ref 70–99)
Glucose-Capillary: 109 mg/dL — ABNORMAL HIGH (ref 70–99)

## 2014-07-10 LAB — POCT INR: INR: 1.2

## 2014-07-10 SURGERY — LEFT HEART CATHETERIZATION WITH CORONARY/GRAFT ANGIOGRAM
Anesthesia: LOCAL

## 2014-07-10 MED ORDER — HEPARIN (PORCINE) IN NACL 2-0.9 UNIT/ML-% IJ SOLN
INTRAMUSCULAR | Status: AC
  Filled 2014-07-10: qty 1500

## 2014-07-10 MED ORDER — FENTANYL CITRATE 0.05 MG/ML IJ SOLN
INTRAMUSCULAR | Status: AC
  Filled 2014-07-10: qty 2

## 2014-07-10 MED ORDER — NITROGLYCERIN 1 MG/10 ML FOR IR/CATH LAB
INTRA_ARTERIAL | Status: AC
  Filled 2014-07-10: qty 10

## 2014-07-10 MED ORDER — LIDOCAINE HCL (PF) 1 % IJ SOLN
INTRAMUSCULAR | Status: AC
  Filled 2014-07-10: qty 30

## 2014-07-10 MED ORDER — SODIUM CHLORIDE 0.9 % IV SOLN: INTRAVENOUS | Status: AC

## 2014-07-10 MED ORDER — SODIUM CHLORIDE 0.9 % IV SOLN
INTRAVENOUS | Status: DC
  Administered 2014-07-10: 10:00:00 via INTRAVENOUS

## 2014-07-10 MED ORDER — ACETAMINOPHEN 325 MG PO TABS: 650.0000 mg | ORAL_TABLET | ORAL | Status: DC | PRN

## 2014-07-10 MED ORDER — SODIUM CHLORIDE 0.9 % IJ SOLN: 3.0000 mL | Freq: Two times a day (BID) | INTRAMUSCULAR | Status: DC

## 2014-07-10 MED ORDER — MIDAZOLAM HCL 2 MG/2ML IJ SOLN
INTRAMUSCULAR | Status: AC
  Filled 2014-07-10: qty 2

## 2014-07-10 MED ORDER — ASPIRIN 81 MG PO CHEW
CHEWABLE_TABLET | ORAL | Status: AC
  Filled 2014-07-10: qty 1

## 2014-07-10 MED ORDER — SODIUM CHLORIDE 0.9 % IV SOLN: 250.0000 mL | INTRAVENOUS | Status: DC | PRN

## 2014-07-10 MED ORDER — ASPIRIN 81 MG PO CHEW
81.0000 mg | CHEWABLE_TABLET | ORAL | Status: AC
  Administered 2014-07-10: 81 mg via ORAL

## 2014-07-10 MED ORDER — SODIUM CHLORIDE 0.9 % IJ SOLN: 3.0000 mL | INTRAMUSCULAR | Status: DC | PRN

## 2014-07-10 MED ORDER — ONDANSETRON HCL 4 MG/2ML IJ SOLN: 4.0000 mg | Freq: Four times a day (QID) | INTRAMUSCULAR | Status: DC | PRN

## 2014-07-10 NOTE — CV Procedure (Signed)
   Cardiac Catheterization Procedure Note  Name: Nathaniel [name] MRN: [id number] DOB: Aug 08, 1932  Procedure: Selective Coronary Angiography, SVG angiography, LIMA angiography.   Indication: New cardiomyopathy, known CAD.    Procedural details: The right groin was prepped, draped, and anesthetized with 1% lidocaine. Using modified Seldinger technique, a 5 French sheath was introduced into the right femoral artery. Standard Judkins catheters were used for coronary angiography, LIMA catheter for LIMA angiography, MP catheter for SVG angiography.  No LV-gram with CKD. Catheter exchanges were performed over a guidewire. There were no immediate procedural complications. The patient was transferred to the post catheterization recovery area for further monitoring.  Procedural Findings: Hemodynamics:  AO 119/65   Coronary angiography: Coronary dominance: right  Left mainstem: Totally occluded at the ostium.   Left anterior descending (LAD): Totally occluded.  LIMA-LAD is patent.  There is 40-50% stenosis in the LIMA at the touchdown on the LAD.   Left circumflex (LCx): Totally occluded LCx.  There was a sequential SVG-OM, ramus, diagonal.  The limbs to OM and diagonal are occluded (known from prior study).  The limb to the ramus is patent with 40% stenosis at touchdown.  The ramus gives faint collaterals to the OM system.   Right coronary artery (RCA): Totally occluded proximally.  Sequential SVG-PDA and PLV from 1st surgery is totally occluded (known).  The SVG -PDA from redo surgery is patent with long 50-60% mid RCA stenosis.  There is good flow to the PDA.   Left ventriculography: Not done, CKD and recent echo.   Final Conclusions:  Severe CAD.  However, anatomy seems to be stable.  The new SVG-PDA is diseased but still patent.  He is not having chest pain.  EF is lower, could be related to atrial fibrillation versus progressive remodeling of ischemic cardiomyopathy.   Restart warfarin  tonight.  I am going to start him on amiodarone 200 mg bid as he is back in atrial fibrillation today, with plan for DCCV when he has been therapeutic x 1 month unless he goes back to NSR on his own on amiodarone.  I would like to keep him in NSR given cardiomyopathy.   [name] 07/10/2014, 12:19 PM

## 2014-07-10 NOTE — Interval H&P Note (Signed)
Cath Lab Visit (complete for each Cath Lab visit)  Clinical Evaluation Leading to the Procedure:   ACS: No.  Non-ACS:    Anginal Classification: CCS III  Anti-ischemic medical therapy: Minimal Therapy (1 class of medications)  Non-Invasive Test Results: No non-invasive testing performed  Prior CABG: Previous CABG      History and Physical Interval Note:  07/10/2014 11:29 AM  Nathaniel [name]  has presented today for surgery, with the diagnosis of cp  The various methods of treatment have been discussed with the patient and family. After consideration of risks, benefits and other options for treatment, the patient has consented to  Procedure(s): LEFT HEART CATHETERIZATION WITH CORONARY/GRAFT ANGIOGRAM (N/A) as a surgical intervention .  The patient's history has been reviewed, patient examined, no change in status, stable for surgery.  I have reviewed the patient's chart and labs.  Questions were answered to the patient's satisfaction.     D[place] [employer]

## 2014-07-10 NOTE — Discharge Instructions (Signed)

## 2014-07-10 NOTE — Progress Notes (Addendum)
Site area: rt groin Site Prior to Removal:  Level 0 Pressure Applied For  MINUTES  20 Manual:   yes Patient Status During Pull:  stable Post Pull Groin Site:  Level 0 Post Pull Instructions Given: yes   Post Pull Pulses Present: yes Dressing Applied:  yes Bedrest begins 1315 Comments no complications

## 2014-07-10 NOTE — H&P (View-Only) (Signed)
Patient ID: Nathaniel [name], male   DOB: 1932/05/26, 80 y.o.   MRN: [id number] PCP: Dr. Elyse Hsu  80 yo with history of extensive vascular disease including CAD s/p CABG and redo CABG, AAA s/p repair, PAD, and carotid stenosis as well as prior AVR presents for cardiology followup.   He has had documentation of significant peripheral arterial disease.  He gets bilateral calf soreness after walking for about 15 minutes.  This resolves with rest, no rest pain.  No pedal ulcers, though when he gets cuts on his feet, they heal slowly.  Peripheral arterial dopplers done in 10/14 showed > 50% focal SFA stenoses bilaterally.  He has at least moderate right common carotid stenosis that may be nearing surgical range.  No stroke-like symptoms.  He had a recent endovascular AAA repair in 5/15.    After his AAA repair, he was noted to be in atrial fibrillation.  He had had a prior episode of atrial fibrillation after his cardiac surgery in 2007.  Atrial fibrillation was rate-controlled.  He was started on warfarin and Toprol XL.  He developed exertional dyspnea and fatigue in atrial fibrillation. I cardioverted him back to NSR on 06/06/14.  I also had him get an echocardiogram in 6/15.  This showed EF worsened to 25-30% with stable bioprosthetic aortic valve, mildly dilated RV with normal systolic function.    Today, he remains in NSR.  He is feeling good overall, better than when he was in atrial fibrillation.  He is currently taking Lasix 40 mg every other day.  He was able to play 18 holes of golf yesterday with no problems.  He did not get tired until the 17th hole.  He has bendopnea.  He is short of breath with showering/drying off.  He has minimal dyspnea now with walking.  No chest pain.  No orthopnea/PND. Weight is down 2 lbs. He is now off Actos.   ECG: NSR, right axis deviation, incomplete LBBB   Labs (5/15): K 4.3, creatinine 0.89 Labs (6/15): K 4.3, creatinine 1.4, BNP 112  PMH: 1. PAD: Peripheral arterial  dopplers in 2012 showed > 75% bilateral SFA stenosis. Peripheral arterial dopplers in 10/14 showed > 50% focal bilateral SFA stenoses.  2. AAA: [place] 4/15 with 4.4 cm AAA but concern for penetrating ulcers. CTA abdomen showed 4.4 cm AAA with penetrating ulcers and possible pseudoaneurysms. Now s/p endovascular repair of AAA in 4/09 with no complications.  3. Carotid stenosis: Carotid dopplers (4/15) with 60-79% bilateral ICA stenosis, RICA may be near surgical range.  4. CAD: CABG 1989 with LIMA-LAD, SVG-D, seq SVG-ramus and OM, sequential SVG-PDA/PLV. SVG-PDA/PLV found to be occluded on cath prior to AVR, so patient had SVG-PDA with AVR in 2007.  5. Severe aortic stenosis: Bioprosthetic AVR in 2007. Echo (11/10) with EF 60-65%, basal to mid inferior hypokinesis, bioprosthetic aortic valve without significant stenosis/regurgitation, mild MR. Echo (11/14) with EF 55-60%, mild LVH, bioprosthetic aortic valve looked ok.  6. Atrial fibrillation: Paroxysmal. Initially noted after cardiac surgery, then again after AAA repair.  DCCV to NSR 06/06/14.  7. Type II diabetes  8. Gout  9. GERD  10. Hyperlipidemia  11. HTN 12. GERD  13. Chronic systolic CHF: Echo (8/11) with EF worsened to 25-30% with grade II diastolic dysfunction, stable bioprosthetic aortic valve, mildly dilated RV with normal systolic function.     SH: Widower, 2 children, raised his 3 grand-daughters, lives in [place], [place].  Occasional ETOH, no smoking.   FH: Father died  from ruptured AAA.   ROS: All systems reviewed and negative except as per HPI.   Current Outpatient Prescriptions  Medication Sig Dispense Refill  . allopurinol (ZYLOPRIM) 100 MG tablet Take 100 mg by mouth daily.        Marland Kitchen atorvastatin (LIPITOR) 20 MG tablet Take 20 mg by mouth daily.      . beta carotene w/minerals (OCUVITE) tablet Take 1 tablet by mouth daily.      . Coenzyme Q10 (CO Q 10) 100 MG CAPS Take 100 mg by mouth daily.       Marland Kitchen doxazosin (CARDURA)  1 MG tablet Take 1-2 mg by mouth 2 (two) times daily. Take1 tablets in the morning and take 2 tablet in the evening      . furosemide (LASIX) 40 MG tablet 40mg  alternating with 20mg  daily  45 tablet  3  . glucosamine-chondroitin 500-400 MG tablet Take 1 tablet by mouth daily.      . metoprolol succinate (TOPROL-XL) 25 MG 24 hr tablet Take 1 tablet (25 mg total) by mouth 2 (two) times daily.  60 tablet  1  . Omega-3 Fatty Acids (FISH OIL) 1000 MG CAPS Take 1,000-2,000 mg by mouth 2 (two) times daily. Takes 1 in the morning and 2 in the evening      . ramipril (ALTACE) 5 MG capsule Take 5 mg by mouth daily.      Marland Kitchen warfarin (COUMADIN) 3 MG tablet Take 6-9 mg by mouth See admin instructions. Take 2 tablets all days except take 3 tablets on Tuesday and Saturday      . ZETIA 10 MG tablet Take 10 mg by mouth daily.       Marland Kitchen spironolactone (ALDACTONE) 25 MG tablet 1 /2 tablet (12.5mg ) daily  15 tablet  3   No current facility-administered medications for this visit.    BP 126/66  Pulse 57  Ht 5' 10.5" (1.791 m)  Wt 195 lb (88.451 kg)  BMI 27.57 kg/m2 General: NAD Neck: JVP 8 cm, no thyromegaly or thyroid nodule.  Lungs: Clear to auscultation bilaterally with normal respiratory effort. CV: Nondisplaced PMI.  Heart regular S1/S2, no S3/S4, 2/6 SEM RUSB.  1+ankle edema bilaterally.  Soft right carotid bruit.  1+ left PT, unable to feel right PT.   Abdomen: Soft, nontender, no hepatosplenomegaly, no distention. Ventral hernia.  Skin: Intact without lesions or rashes.  Neurologic: Alert and oriented x 3.  Psych: Normal affect. Extremities: No clubbing or cyanosis.   Assessment/Plan:  1. CAD: s/p CABG and redo CABG.  No chest pain.  I am concerned, however, for worsening of CAD given fall in EF to 25-30%.  This coincides with atrial fibrillation onset, but he has not been tachycardic, so do not think this could be blamed on tachy-mediated CMP. - Given significant fall in EF, I am going to arrange for  cardiac cath.  As symptoms are stable to improved and he has had no chest pain, I will wait for 4 wks post-cardioversion before interrupting anticoagulation for LHC.  He will hold warfarin 4 days prior to cath. - Continue statin, ramipril.   - He is on warfarin in setting of stable CAD so not on ASA 81.  2. Hyperlipidemia: He is on atorvastatin, Zetia, and Lovaza. He should have aggressive control given extensive vascular disease.  I will check lipids with next labs.  3. PAD: Patient has moderate claudication symptoms.  No rest symptoms or foot ulcers. Peripheral arterial dopplers showed > 50%  bilateral focal SFA stenoses.  I have talked to the patient about a trial of cilostazol +/- PV referral.  At the time, he did not want either.  We have talked about trying to walk through the pain for a short distance as this may help encourage collaterals.  Symptoms have been stable and is now followed by [facility].  4. Carotid stenosis: Most recent carotid dopplers were concerning for RICA stenosis nearing surgical range.  He has had no stroke-like symptoms.  He has seen Dr. Trula Slade and will be getting carotid dopplers in followup at [facility].  5. AAA: Successful endovascular repair in 5/15.  6. Bioprosthetic AVR: Valve stable on 6/15 echo.  7. Atrial fibrillation: He developed recurrent atrial fibrillation post-op AAA repair.  Given the bioprosthetic aortic valve and his CAD, I ended up thinking that warfarin is a better choice than NOAC (he is now on warfarin).  He is status post DCCV and feels much better symptomatically now that he is in NSR.  - If atrial fibrillation recurs, would plan loading with either amiodarone or Tikosyn followed by repeat cardioversion given his symptoms with atrial fibrillation.  - Continue Toprol XL.   8. Chronic systolic CHF: EF now 26-20%, was normal on prior echo in 11/14.  As his atrial fibrillation was not persistently fast, I cannot blame this on tachycardia-mediated cardiomyopathy.  I am  concerned for worsening CAD.  NYHA class II-III symptoms now, improved.  He still has some volume overload.  - Increase Lasix to 40 daily alternating with 20 daily. - Continue Toprol XL and ramipril at current doses.  - Add spironolactone 12.5 mg daily and stop KCl.  - Start Lasix 40 mg daily + KCl 20 mEq daily.  - BMET/BNP in 10 days.  - Needs to follow low sodium diet.  - LHC as above to assess for worsening of coronary disease.    [name] 06/12/2014

## 2014-07-10 NOTE — Progress Notes (Signed)
Rt femoral arterial sheath removed by Rayford Halsted

## 2014-07-17 ENCOUNTER — Other Ambulatory Visit: Payer: Self-pay | Admitting: Cardiology

## 2014-07-20 ENCOUNTER — Ambulatory Visit (INDEPENDENT_AMBULATORY_CARE_PROVIDER_SITE_OTHER): Payer: Medicare Other | Admitting: *Deleted

## 2014-07-20 ENCOUNTER — Ambulatory Visit (INDEPENDENT_AMBULATORY_CARE_PROVIDER_SITE_OTHER): Payer: Medicare Other | Admitting: Cardiology

## 2014-07-20 ENCOUNTER — Encounter: Payer: Self-pay | Admitting: Cardiology

## 2014-07-20 VITALS — BP 118/64 | HR 92 | Ht 70.5 in | Wt 193.0 lb

## 2014-07-20 DIAGNOSIS — R0602 Shortness of breath: Secondary | ICD-10-CM

## 2014-07-20 DIAGNOSIS — I509 Heart failure, unspecified: Secondary | ICD-10-CM

## 2014-07-20 DIAGNOSIS — Z79899 Other long term (current) drug therapy: Secondary | ICD-10-CM | POA: Diagnosis not present

## 2014-07-20 DIAGNOSIS — I4819 Other persistent atrial fibrillation: Secondary | ICD-10-CM

## 2014-07-20 DIAGNOSIS — I10 Essential (primary) hypertension: Secondary | ICD-10-CM | POA: Diagnosis not present

## 2014-07-20 DIAGNOSIS — I208 Other forms of angina pectoris: Secondary | ICD-10-CM | POA: Diagnosis not present

## 2014-07-20 DIAGNOSIS — Z953 Presence of xenogenic heart valve: Secondary | ICD-10-CM

## 2014-07-20 DIAGNOSIS — I25812 Atherosclerosis of bypass graft of coronary artery of transplanted heart without angina pectoris: Secondary | ICD-10-CM

## 2014-07-20 DIAGNOSIS — I5022 Chronic systolic (congestive) heart failure: Secondary | ICD-10-CM

## 2014-07-20 DIAGNOSIS — Z952 Presence of prosthetic heart valve: Secondary | ICD-10-CM | POA: Diagnosis not present

## 2014-07-20 DIAGNOSIS — I4891 Unspecified atrial fibrillation: Secondary | ICD-10-CM

## 2014-07-20 DIAGNOSIS — E785 Hyperlipidemia, unspecified: Secondary | ICD-10-CM

## 2014-07-20 LAB — HEPATIC FUNCTION PANEL
ALK PHOS: 60 U/L (ref 39–117)
ALT: 14 U/L (ref 0–53)
AST: 17 U/L (ref 0–37)
Albumin: 4 g/dL (ref 3.5–5.2)
BILIRUBIN DIRECT: 0.1 mg/dL (ref 0.0–0.3)
BILIRUBIN TOTAL: 1 mg/dL (ref 0.2–1.2)
Total Protein: 6.9 g/dL (ref 6.0–8.3)

## 2014-07-20 LAB — BASIC METABOLIC PANEL
BUN: 20 mg/dL (ref 6–23)
CALCIUM: 8.8 mg/dL (ref 8.4–10.5)
CHLORIDE: 103 meq/L (ref 96–112)
CO2: 26 meq/L (ref 19–32)
Creatinine, Ser: 1.2 mg/dL (ref 0.4–1.5)
GFR: 60.7 mL/min (ref >60.00)
GLUCOSE: 104 mg/dL — AB (ref 70–99)
POTASSIUM: 4 meq/L (ref 3.5–5.1)
SODIUM: 135 meq/L (ref 135–145)

## 2014-07-20 LAB — TSH: TSH: 1.89 u[IU]/mL (ref 0.35–4.50)

## 2014-07-20 LAB — POCT INR: INR: 1.3

## 2014-07-20 LAB — BRAIN NATRIURETIC PEPTIDE: Pro B Natriuretic peptide (BNP): 113 pg/mL — ABNORMAL HIGH (ref 0.0–100.0)

## 2014-07-20 MED ORDER — FUROSEMIDE 40 MG PO TABS: 40.0000 mg | ORAL_TABLET | Freq: Every day | ORAL | Status: DC

## 2014-07-20 MED ORDER — SPIRONOLACTONE 25 MG PO TABS: 25.0000 mg | ORAL_TABLET | Freq: Every day | ORAL | Status: DC

## 2014-07-20 MED ORDER — METOPROLOL SUCCINATE ER 50 MG PO TB24: ORAL_TABLET | ORAL | Status: DC

## 2014-07-20 MED ORDER — AMIODARONE HCL 200 MG PO TABS: 200.0000 mg | ORAL_TABLET | Freq: Every day | ORAL | Status: DC

## 2014-07-20 NOTE — Patient Instructions (Addendum)
Please increase your Spironalactone to 25 mg a day,  increase your Metoprolol to 50 mg in the morning and 25 mg in the evening.   Decrease your Amiodarone to 200 mg once a day.   Restart Atorvastatin 20 mg a day.   Take Furosemide 40 mg a day.  Please have blood work today (BMP, BNP, TSH and LFT) and again in one week (BMP).  You have been referred to Cardiac Rehab.  Please have weekly INRs so that Dr Aundra Dubin can get you ready for a cardioversion.  Follow up in 2 weeks with Dr Aundra Dubin (OK to over book)

## 2014-07-20 NOTE — Progress Notes (Signed)
Patient ID: Nathaniel [name], male   DOB: [birth year], 80 y.o.   MRN: [id number] PCP: Dr. Elyse Hsu  80 yo with history of extensive vascular disease including CAD s/p CABG and redo CABG, AAA s/p repair, PAD, and carotid stenosis as well as prior AVR presents for cardiology followup.   He has had documentation of significant peripheral arterial disease.  He gets bilateral calf soreness after walking for about 15 minutes.  This resolves with rest, no rest pain.  No pedal ulcers, though when he gets cuts on his feet, they heal slowly.  Peripheral arterial dopplers done in 10/14 showed > 50% focal SFA stenoses bilaterally.  He has at least moderate right common carotid stenosis that may be nearing surgical range.  No stroke-like symptoms.  He had a recent endovascular AAA repair in 5/15.    After his AAA repair, he was noted to be in atrial fibrillation.  He had had a prior episode of atrial fibrillation after his cardiac surgery in 2007.  Atrial fibrillation was rate-controlled.  He was started on warfarin and Toprol XL.  He developed exertional dyspnea and fatigue in atrial fibrillation. I cardioverted him back to NSR on 06/06/14.  I also had him get an echocardiogram in 6/15.  This showed EF worsened to 25-30% with stable bioprosthetic aortic valve, mildly dilated RV with normal systolic function.  Given the fall in EF, I took him for [facility] in 7/15.  This showed stable anatomy.  The LIMA-LAD was patent, the sequential SVG-OM/ramus/D was patent only to ramus but this is the same as the prior cath, and the SVG-PDA was patent with a long 50-60% mid-graft stenosis.  No intervention.  Of note, at cath he was noted to be back in atrial fibrillation and is in atrial fibrillation today.   I started him on amiodarone at the time of his cardiac cath with plan to eventually re-attempt cardioversion while on an anti-arrhythmic.  He has not felt good since starting on amiodarone.  He says he has felt nervous, anxious, and  "jittery."  Last night, after dinner he developed abdominal discomfort that felt like gas. It is better today but he feels weak.  At times, he is short of breath but in general, but he can walk on flat ground with no problems normally.  No chest pain, no orthopnea/PND.  INR was subtherapeutic today at 1.3.    Some confusion with medications.  He stopped taking both Actos and atorvastatin (was only supposed to stop Actos).  He stopped Lasix a couple of days then restarted it.   ECG: atrial fibrillation, right axis deviation, incomplete LBBB, inferior TWIs  Labs (5/15): K 4.3, creatinine 0.89 Labs (6/15): K 4.3, creatinine 1.4, BNP 112 Labs (7/15): K 4.4, creatinine 1.2, HCT 40.2  PMH: 1. PAD: Peripheral arterial dopplers in 2012 showed > 75% bilateral SFA stenosis. Peripheral arterial dopplers in 10/14 showed > 50% focal bilateral SFA stenoses.  2. AAA: [place] 4/15 with 4.4 cm AAA but concern for penetrating ulcers. CTA abdomen showed 4.4 cm AAA with penetrating ulcers and possible pseudoaneurysms. Now s/p endovascular repair of AAA in 2/95 with no complications.  3. Carotid stenosis: Carotid dopplers (4/15) with 60-79% bilateral ICA stenosis, RICA may be near surgical range.  4. CAD: CABG 1989 with LIMA-LAD, SVG-D, seq SVG-ramus and OM, sequential SVG-PDA/PLV. SVG-PDA/PLV found to be occluded on cath prior to AVR, so patient had SVG-PDA with AVR in 2007. LHC (7/15) with LIMA-LAD patent with 40-50% stenosis in LAD after  touchdown, sequential SVG-ramus/OM/diagonal with only the ramus branch still intact (known from prior cath), SVG-PDA from original surgery TO, SVG-PDA from redo surgery with long 50-60% mid-graft stenosis.  No target for intervention.  5. Severe aortic stenosis: Bioprosthetic AVR in 2007. Echo (11/10) with EF 60-65%, basal to mid inferior hypokinesis, bioprosthetic aortic valve without significant stenosis/regurgitation, mild MR. Echo (11/14) with EF 55-60%, mild LVH, bioprosthetic aortic  valve looked ok. Bioprosthetic valve looked ok on 6/15 echo.   6. Atrial fibrillation: Paroxysmal. Initially noted after cardiac surgery, then again after AAA repair.  DCCV to NSR 06/06/14.  7. Type II diabetes  8. Gout  9. GERD  10. Hyperlipidemia  11. HTN 12. GERD  13. Chronic systolic CHF: Echo (1/61) with EF worsened to 25-30% with grade II diastolic dysfunction, stable bioprosthetic aortic valve, mildly dilated RV with normal systolic function.     SH: Widower, 2 children, raised his 3 grand-daughters, lives in [place], [place].  Occasional ETOH, no smoking.   FH: Father died from ruptured AAA.   ROS: All systems reviewed and negative except as per HPI.   Current Outpatient Prescriptions  Medication Sig Dispense Refill  . allopurinol (ZYLOPRIM) 100 MG tablet Take 100 mg by mouth daily.        Marland Kitchen amiodarone (PACERONE) 200 MG tablet Take 1 tablet (200 mg total) by mouth daily.      . beta carotene w/minerals (OCUVITE) tablet Take 1 tablet by mouth daily.      . Coenzyme Q10 (CO Q 10) 100 MG CAPS Take 100 mg by mouth daily.       Marland Kitchen doxazosin (CARDURA) 1 MG tablet Take 1-2 mg by mouth 2 (two) times daily. Take1 tablets in the morning and take 2 tablet in the evening      . glucosamine-chondroitin 500-400 MG tablet Take 1 tablet by mouth daily.      Marland Kitchen JANUVIA 100 MG tablet Take 1 tablet by mouth daily.      . metoprolol succinate (TOPROL-XL) 50 MG 24 hr tablet Take 50 mg in the am and 25 mg in the pm  60 tablet  11  . Omega-3 Fatty Acids (FISH OIL) 1000 MG CAPS Take 1,000-2,000 mg by mouth 2 (two) times daily. Takes 2 in the morning and 1 in the evening      . potassium chloride SA (K-DUR,KLOR-CON) 20 MEQ tablet Take 20 mEq by mouth daily.      . ramipril (ALTACE) 5 MG capsule Take 5 mg by mouth daily.      Marland Kitchen spironolactone (ALDACTONE) 25 MG tablet Take 1 tablet (25 mg total) by mouth daily.  30 tablet  11  . warfarin (COUMADIN) 3 MG tablet USE AS DIRECTED BY ANTICOAGULATION CLINIC   70 tablet  3  . ZETIA 10 MG tablet Take 10 mg by mouth daily.       . furosemide (LASIX) 40 MG tablet Take 1 tablet (40 mg total) by mouth daily.  30 tablet  11   No current facility-administered medications for this visit.    BP 118/64  Pulse 92  Ht 5' 10.5" (1.791 m)  Wt 193 lb (87.544 kg)  BMI 27.29 kg/m2 General: NAD Neck: JVP 8 cm with HJR, no thyromegaly or thyroid nodule.  Lungs: Clear to auscultation bilaterally with normal respiratory effort. CV: Nondisplaced PMI.  Heart regular S1/S2, no S3/S4, 2/6 SEM RUSB.  1+ankle edema bilaterally.  Soft right carotid bruit.  1+ left PT, unable to feel right PT.  Abdomen: Soft, nontender, no hepatosplenomegaly, no distention. Ventral hernia.  Skin: Intact without lesions or rashes.  Neurologic: Alert and oriented x 3.  Psych: Normal affect. Extremities: No clubbing or cyanosis.   Assessment/Plan:  1. CAD: s/p CABG and redo CABG.  Given recent fall in EF, I did a cardiac cath in 7/15 as documented above. Unfortunately, despite significant coronary disease, there were no good interventional targets. - Continue statin (restart atorvastatin), ramipril.   - He is on warfarin in setting of stable CAD so not on ASA 81.  2. Hyperlipidemia: He should have aggressive control given extensive vascular disease.  Restart atorvastatin, check lipids next appointment.  3. PAD: Patient has moderate stable claudication symptoms.  No rest symptoms or foot ulcers. Peripheral arterial dopplers showed > 50% bilateral focal SFA stenoses.  I have talked to the patient about a trial of cilostazol +/- PV referral.  At the time, he did not want either.  We have talked about trying to walk through the pain for a short distance as this may help encourage collaterals.  Symptoms have been stable and is now followed by [facility].  4. Carotid stenosis: Most recent carotid dopplers were concerning for RICA stenosis nearing surgical range.  He has had no stroke-like symptoms.  He  has seen Dr. Trula Slade and will be getting carotid dopplers in followup at [facility].  5. AAA: Successful endovascular repair in 5/15.  6. Bioprosthetic AVR: Valve stable on 6/15 echo.  7. Atrial fibrillation: He has been cardioverted once but is back in atrial fibrillation now.  I suspect that he will not hold NSR without an antiarrhyhmic; he is now on amiodarone.  Given the bioprosthetic aortic valve and his CAD, I ended up thinking that warfarin is a better choice than NOAC (he is now on warfarin).  He has symptomatic fatigue and dyspnea while in atrial fibrillation, so ideally we would keep him out.  - Patient is concerned that some of his symptoms are related to starting amiodarone (anxiety, "jitteriness").   I am going to have him decrease amiodarone to 200 mg daily to see if this helps the symptoms.  Check LFTs/TSH today.  He will need a yearly eye exam on amiodarone. - When INR has been therapeutic x 4 wks, repeat cardioversion on amiodarone.  INR was low today.  - Increase Toprol XL to 50 qam/25 qpm given elevated rate. 8. Chronic systolic CHF: Suspect ischemic cardiomyopathy.  EF now 25-30%, was normal on prior echo in 11/14.  As his atrial fibrillation was not persistently fast, I cannot blame this on tachycardia-mediated cardiomyopathy.  LHC showed significant coronary disease but no interventional option. NYHA class II-III symptoms now, stable.  He still has some volume overload.  - Increase Lasix to 40 mg daily.  - Continue ramipril but increase Toprol XL as above.  - Can increase spironolactone to 25 mg daily.  He is not taking KCl.  - BMET/BNP today and at followup in 2 wks.    [name] 07/20/2014

## 2014-07-27 ENCOUNTER — Ambulatory Visit (INDEPENDENT_AMBULATORY_CARE_PROVIDER_SITE_OTHER): Payer: Medicare Other | Admitting: Pharmacist

## 2014-07-27 DIAGNOSIS — I4891 Unspecified atrial fibrillation: Secondary | ICD-10-CM

## 2014-07-27 LAB — POCT INR: INR: 2

## 2014-08-03 ENCOUNTER — Encounter: Payer: Self-pay | Admitting: *Deleted

## 2014-08-03 ENCOUNTER — Ambulatory Visit (INDEPENDENT_AMBULATORY_CARE_PROVIDER_SITE_OTHER): Payer: Medicare Other | Admitting: Cardiology

## 2014-08-03 ENCOUNTER — Ambulatory Visit (INDEPENDENT_AMBULATORY_CARE_PROVIDER_SITE_OTHER): Payer: Medicare Other | Admitting: *Deleted

## 2014-08-03 ENCOUNTER — Encounter: Payer: Self-pay | Admitting: Cardiology

## 2014-08-03 VITALS — BP 115/70 | HR 90 | Ht 70.0 in | Wt 182.0 lb

## 2014-08-03 DIAGNOSIS — I25810 Atherosclerosis of coronary artery bypass graft(s) without angina pectoris: Secondary | ICD-10-CM | POA: Diagnosis not present

## 2014-08-03 DIAGNOSIS — I4891 Unspecified atrial fibrillation: Secondary | ICD-10-CM

## 2014-08-03 DIAGNOSIS — I208 Other forms of angina pectoris: Secondary | ICD-10-CM | POA: Diagnosis not present

## 2014-08-03 DIAGNOSIS — I714 Abdominal aortic aneurysm, without rupture, unspecified: Secondary | ICD-10-CM

## 2014-08-03 DIAGNOSIS — I509 Heart failure, unspecified: Secondary | ICD-10-CM | POA: Diagnosis not present

## 2014-08-03 DIAGNOSIS — I739 Peripheral vascular disease, unspecified: Secondary | ICD-10-CM

## 2014-08-03 DIAGNOSIS — I5022 Chronic systolic (congestive) heart failure: Secondary | ICD-10-CM | POA: Diagnosis not present

## 2014-08-03 DIAGNOSIS — I480 Paroxysmal atrial fibrillation: Secondary | ICD-10-CM

## 2014-08-03 DIAGNOSIS — E785 Hyperlipidemia, unspecified: Secondary | ICD-10-CM

## 2014-08-03 DIAGNOSIS — I2089 Other forms of angina pectoris: Secondary | ICD-10-CM

## 2014-08-03 DIAGNOSIS — Z952 Presence of prosthetic heart valve: Secondary | ICD-10-CM

## 2014-08-03 DIAGNOSIS — Z953 Presence of xenogenic heart valve: Secondary | ICD-10-CM

## 2014-08-03 LAB — POCT INR: INR: 2.5

## 2014-08-03 MED ORDER — METOPROLOL SUCCINATE ER 50 MG PO TB24: ORAL_TABLET | ORAL | Status: DC

## 2014-08-03 MED ORDER — ATORVASTATIN CALCIUM 20 MG PO TABS: 20.0000 mg | ORAL_TABLET | Freq: Every day | ORAL | Status: DC

## 2014-08-03 NOTE — Patient Instructions (Addendum)
Increase metoprolol succinate (Toprol XL) to 50mg  in the AM and 25mg  in the PM. This will be 1 of your 50mg  tablets in the AM and 1/2 of your 50mg  tablets in the PM.  Take atorvastatin 20mg  daily. This is for your cholesterol. This is generic lipitor.  Your physician has recommended that you have a Cardioversion (DCCV). Electrical Cardioversion uses a jolt of electricity to your heart either through paddles or wired patches attached to your chest. This is a controlled, usually prescheduled, procedure. Defibrillation is done under light anesthesia in the hospital, and you usually go home the day of the procedure. This is done to get your heart back into a normal rhythm. You are not awake for the procedure. Please see the instruction sheet given to you today. Tuesday September 8,2015  Your physician recommends that you schedule a follow-up appointment with Dr Aundra Dubin 2 weeks after the cardioversion. This is scheduled for Thursday September 24,2015 at 8:30AM.

## 2014-08-04 NOTE — Progress Notes (Signed)
Patient ID: Nathaniel [name], male   DOB: [birth year], 80 y.o.   MRN: [id number] PCP: Dr. Elyse Hsu  80 yo with history of extensive vascular disease including CAD s/p CABG and redo CABG, AAA s/p repair, PAD, and carotid stenosis as well as prior AVR presents for cardiology followup.   He has had documentation of significant peripheral arterial disease.  He gets bilateral calf soreness after walking for about 15 minutes.  This resolves with rest, no rest pain.  No pedal ulcers, though when he gets cuts on his feet, they heal slowly.  Peripheral arterial dopplers done in 10/14 showed > 50% focal SFA stenoses bilaterally.  He has at least moderate right common carotid stenosis that may be nearing surgical range.  No stroke-like symptoms.  He had a recent endovascular AAA repair in 5/15.    After his AAA repair, he was noted to be in atrial fibrillation.  He had had a prior episode of atrial fibrillation after his cardiac surgery in 2007.  Atrial fibrillation was rate-controlled.  He was started on warfarin and Toprol XL.  He developed exertional dyspnea and fatigue in atrial fibrillation. I cardioverted him back to NSR on 06/06/14.  I also had him get an echocardiogram in 6/15.  This showed EF worsened to 25-30% with stable bioprosthetic aortic valve, mildly dilated RV with normal systolic function.  Given the fall in EF, I took him for [facility] in 7/15.  This showed stable anatomy.  The LIMA-LAD was patent, the sequential SVG-OM/ramus/D was patent only to ramus but this is the same as the prior cath, and the SVG-PDA was patent with a long 50-60% mid-graft stenosis.  No intervention.  Of note, at cath he was noted to be back in atrial fibrillation and is in atrial fibrillation today.   I started him on amiodarone at the time of his cardiac cath with plan to eventually re-attempt cardioversion while on an anti-arrhythmic.  He had a sensation of "jitteriness" on amiodarone 200 mg bid but this has resolved since I have  decreased his amiodarone to 200 mg daily.  INR has been therapeutic since 8/14. He is short of breath walking up a hill but does ok on flat ground.  He fatigues easily and it is hard to get through 18 holes of golf.  No chest pain, no orthopnea/PND.   He is still not back on atorvastatin.  He is taking Lasix 40 mg daily alternating with 20 mg daily. Weight is down 11 lbs.   ECG: atrial fibrillation at 90, right axis deviation, anterolateral and inferior TWIs  Labs (5/15): K 4.3, creatinine 0.89 Labs (6/15): K 4.3, creatinine 1.4, BNP 112 Labs (7/15): K 4.4, creatinine 1.2, HCT 40.2 Labs (8/15): K 4, creatinine 1.2, BNP 113, LFTs and TSH normal  PMH: 1. PAD: Peripheral arterial dopplers in 2012 showed > 75% bilateral SFA stenosis. Peripheral arterial dopplers in 10/14 showed > 50% focal bilateral SFA stenoses.  2. AAA: [place] 4/15 with 4.4 cm AAA but concern for penetrating ulcers. CTA abdomen showed 4.4 cm AAA with penetrating ulcers and possible pseudoaneurysms. Now s/p endovascular repair of AAA in 6/96 with no complications.  3. Carotid stenosis: Carotid dopplers (4/15) with 60-79% bilateral ICA stenosis, RICA may be near surgical range.  4. CAD: CABG 1989 with LIMA-LAD, SVG-D, seq SVG-ramus and OM, sequential SVG-PDA/PLV. SVG-PDA/PLV found to be occluded on cath prior to AVR, so patient had SVG-PDA with AVR in 2007. LHC (7/15) with LIMA-LAD patent with 40-50% stenosis in  LAD after touchdown, sequential SVG-ramus/OM/diagonal with only the ramus branch still intact (known from prior cath), SVG-PDA from original surgery TO, SVG-PDA from redo surgery with long 50-60% mid-graft stenosis.  No target for intervention.  5. Severe aortic stenosis: Bioprosthetic AVR in 2007. Echo (11/10) with EF 60-65%, basal to mid inferior hypokinesis, bioprosthetic aortic valve without significant stenosis/regurgitation, mild MR. Echo (11/14) with EF 55-60%, mild LVH, bioprosthetic aortic valve looked ok. Bioprosthetic  valve looked ok on 6/15 echo.   6. Atrial fibrillation: Paroxysmal. Initially noted after cardiac surgery, then again after AAA repair.  DCCV to NSR 06/06/14.  7. Type II diabetes  8. Gout  9. GERD  10. Hyperlipidemia  11. HTN 12. GERD  13. Chronic systolic CHF: Echo (0/99) with EF worsened to 25-30% with grade II diastolic dysfunction, stable bioprosthetic aortic valve, mildly dilated RV with normal systolic function.    SH: Widower, 2 children, raised his 3 grand-daughters, lives in [place], [place].  Occasional ETOH, no smoking.   FH: Father died from ruptured AAA.   ROS: All systems reviewed and negative except as per HPI.   Current Outpatient Prescriptions  Medication Sig Dispense Refill  . allopurinol (ZYLOPRIM) 100 MG tablet Take 100 mg by mouth daily.        Marland Kitchen amiodarone (PACERONE) 200 MG tablet Take 1 tablet (200 mg total) by mouth daily.      . beta carotene w/minerals (OCUVITE) tablet Take 1 tablet by mouth daily.      . Coenzyme Q10 (CO Q 10) 100 MG CAPS Take 100 mg by mouth daily.       Marland Kitchen doxazosin (CARDURA) 1 MG tablet Take by mouth. Take 2 tablets in the morning and take 1 tablet in the evening      . furosemide (LASIX) 40 MG tablet Take 1 tablet (40 mg total) by mouth daily.  30 tablet  11  . glucosamine-chondroitin 500-400 MG tablet Take 1 tablet by mouth daily.      Marland Kitchen JANUVIA 100 MG tablet Take 1 tablet by mouth daily.      . Omega-3 Fatty Acids (FISH OIL) 1000 MG CAPS Takes 2 in the morning and 1 in the evening      . potassium chloride SA (K-DUR,KLOR-CON) 20 MEQ tablet Take 20 mEq by mouth daily.      . ramipril (ALTACE) 5 MG capsule Take 5 mg by mouth daily.      Marland Kitchen spironolactone (ALDACTONE) 25 MG tablet Take 1 tablet (25 mg total) by mouth daily.  30 tablet  11  . warfarin (COUMADIN) 3 MG tablet USE AS DIRECTED BY ANTICOAGULATION CLINIC  70 tablet  3  . ZETIA 10 MG tablet Take 10 mg by mouth daily.       Marland Kitchen atorvastatin (LIPITOR) 20 MG tablet Take 1 tablet  (20 mg total) by mouth daily.  30 tablet  6  . metoprolol succinate (TOPROL-XL) 50 MG 24 hr tablet 1 tablet (50mg ) and 1/2 tablet (25mg ) PM Take with or immediately following a meal.  45 tablet  6   No current facility-administered medications for this visit.    BP 115/70  Pulse 90  Ht 5\' 10"  (1.778 m)  Wt 182 lb (82.555 kg)  BMI 26.11 kg/m2 General: NAD Neck: JVP 7 cm, no thyromegaly or thyroid nodule.  Lungs: Clear to auscultation bilaterally with normal respiratory effort. CV: Nondisplaced PMI.  Heart irregular S1/S2, no S3/S4, 2/6 early SEM RUSB.  No edema bilaterally.  Soft right carotid  bruit.  1+ left PT, unable to feel right PT.   Abdomen: Soft, nontender, no hepatosplenomegaly, no distention. Ventral hernia.  Skin: Intact without lesions or rashes.  Neurologic: Alert and oriented x 3.  Psych: Normal affect. Extremities: No clubbing or cyanosis.   Assessment/Plan:  1. CAD: s/p CABG and redo CABG.  Given recent fall in EF, I did a cardiac cath in 7/15 as documented above. Unfortunately, despite significant coronary disease, there were no good interventional targets. - Continue statin (needs to restart atorvastatin today), ramipril.   - He is on warfarin in setting of stable CAD so not on ASA 81.  2. Hyperlipidemia: He should have aggressive control given extensive vascular disease.  Restart atorvastatin today (stopped by mistake), check lipids in 2 months.  3. PAD: Patient has moderate stable claudication symptoms.  No rest symptoms or foot ulcers. Peripheral arterial dopplers showed > 50% bilateral focal SFA stenoses.  I have talked to the patient about a trial of cilostazol +/- PV referral.  At the time, he did not want either.  We have talked about trying to walk through the pain for a short distance as this may help encourage collaterals.  Symptoms have been stable and is now followed by [facility].  4. Carotid stenosis: Most recent carotid dopplers were concerning for RICA stenosis  nearing surgical range.  He has had no stroke-like symptoms.  He has seen Dr. Trula Slade and will be getting carotid dopplers in followup at [facility].  5. AAA: Successful endovascular repair in 5/15.  6. Bioprosthetic AVR: Valve stable on 6/15 echo.  7. Atrial fibrillation: He has been cardioverted once but is back in atrial fibrillation now.  I suspect that he will not hold NSR without an antiarrhyhmic; he is now on amiodarone.  Given the bioprosthetic aortic valve and his CAD, I ended up thinking that warfarin is a better choice than NOAC (he is now on warfarin).  He has symptomatic fatigue and dyspnea while in atrial fibrillation, so ideally we would keep him out.  He is tolerating amiodarone at 200 mg daily.  - Recent TSH and LFTs normal.  He will need a yearly eye exam on amiodarone. - When INR has been therapeutic x 4 wks, repeat cardioversion on amiodarone => will need 2 more weeks.  - Increase Toprol XL to 50 qam/25 qpm (he is still taking Toprol XL 25 mg bid). 8. Chronic systolic CHF: Suspect ischemic cardiomyopathy.  EF now 25-30%, was normal on prior echo in 11/14.  As his atrial fibrillation has not been persistently fast, I cannot blame this on tachycardia-mediated cardiomyopathy.  LHC showed significant coronary disease but no interventional option. NYHA class II-III symptoms now, stable.  He does not look volume overloaded today.  - Continue Lasix 40 mg daily alternating with 20 mg daily.  - Continue ramipril but increase Toprol XL as above.  - Continue spironolactone 25 mg daily.  K has been ok.    [name] 08/04/2014

## 2014-08-09 ENCOUNTER — Ambulatory Visit (INDEPENDENT_AMBULATORY_CARE_PROVIDER_SITE_OTHER): Payer: Medicare Other

## 2014-08-09 DIAGNOSIS — I4891 Unspecified atrial fibrillation: Secondary | ICD-10-CM

## 2014-08-09 LAB — POCT INR: INR: 5

## 2014-08-17 ENCOUNTER — Ambulatory Visit (INDEPENDENT_AMBULATORY_CARE_PROVIDER_SITE_OTHER): Payer: Medicare Other | Admitting: Surgery

## 2014-08-17 DIAGNOSIS — I4891 Unspecified atrial fibrillation: Secondary | ICD-10-CM

## 2014-08-17 LAB — POCT INR: INR: 4.6

## 2014-08-21 ENCOUNTER — Encounter (HOSPITAL_COMMUNITY): Payer: Medicare Other | Admitting: Critical Care Medicine

## 2014-08-21 ENCOUNTER — Encounter (HOSPITAL_COMMUNITY): Payer: Self-pay

## 2014-08-21 ENCOUNTER — Ambulatory Visit (HOSPITAL_COMMUNITY)
Admission: RE | Admit: 2014-08-21 | Discharge: 2014-08-21 | Disposition: A | Discharge status: Home / Self Care | Payer: Medicare Other | Source: Ambulatory Visit | Attending: Cardiology | Admitting: Cardiology

## 2014-08-21 ENCOUNTER — Ambulatory Visit (HOSPITAL_COMMUNITY): Payer: Medicare Other | Admitting: Critical Care Medicine

## 2014-08-21 ENCOUNTER — Encounter (HOSPITAL_COMMUNITY)
Admission: RE | Disposition: A | Discharge status: Home / Self Care | Payer: Self-pay | Source: Ambulatory Visit | Attending: Cardiology

## 2014-08-21 ENCOUNTER — Ambulatory Visit (INDEPENDENT_AMBULATORY_CARE_PROVIDER_SITE_OTHER): Payer: Medicare Other | Admitting: *Deleted

## 2014-08-21 DIAGNOSIS — Z951 Presence of aortocoronary bypass graft: Secondary | ICD-10-CM | POA: Insufficient documentation

## 2014-08-21 DIAGNOSIS — I4891 Unspecified atrial fibrillation: Secondary | ICD-10-CM | POA: Diagnosis not present

## 2014-08-21 DIAGNOSIS — K219 Gastro-esophageal reflux disease without esophagitis: Secondary | ICD-10-CM | POA: Diagnosis not present

## 2014-08-21 DIAGNOSIS — Z7901 Long term (current) use of anticoagulants: Secondary | ICD-10-CM | POA: Diagnosis not present

## 2014-08-21 DIAGNOSIS — I6529 Occlusion and stenosis of unspecified carotid artery: Secondary | ICD-10-CM | POA: Insufficient documentation

## 2014-08-21 DIAGNOSIS — I509 Heart failure, unspecified: Secondary | ICD-10-CM | POA: Insufficient documentation

## 2014-08-21 DIAGNOSIS — I739 Peripheral vascular disease, unspecified: Secondary | ICD-10-CM | POA: Diagnosis not present

## 2014-08-21 DIAGNOSIS — Z79899 Other long term (current) drug therapy: Secondary | ICD-10-CM | POA: Diagnosis not present

## 2014-08-21 DIAGNOSIS — I5022 Chronic systolic (congestive) heart failure: Secondary | ICD-10-CM | POA: Diagnosis not present

## 2014-08-21 DIAGNOSIS — M109 Gout, unspecified: Secondary | ICD-10-CM | POA: Insufficient documentation

## 2014-08-21 DIAGNOSIS — Z87891 Personal history of nicotine dependence: Secondary | ICD-10-CM | POA: Insufficient documentation

## 2014-08-21 DIAGNOSIS — E785 Hyperlipidemia, unspecified: Secondary | ICD-10-CM | POA: Diagnosis not present

## 2014-08-21 DIAGNOSIS — I2510 Atherosclerotic heart disease of native coronary artery without angina pectoris: Secondary | ICD-10-CM | POA: Diagnosis not present

## 2014-08-21 DIAGNOSIS — I10 Essential (primary) hypertension: Secondary | ICD-10-CM | POA: Insufficient documentation

## 2014-08-21 DIAGNOSIS — E119 Type 2 diabetes mellitus without complications: Secondary | ICD-10-CM | POA: Insufficient documentation

## 2014-08-21 HISTORY — PX: CARDIOVERSION: SHX1299

## 2014-08-21 LAB — GLUCOSE, CAPILLARY: GLUCOSE-CAPILLARY: 121 mg/dL — AB (ref 70–99)

## 2014-08-21 LAB — POCT INR: INR: 3.2

## 2014-08-21 SURGERY — CARDIOVERSION
Anesthesia: General

## 2014-08-21 MED ORDER — SODIUM CHLORIDE 0.9 % IV SOLN
250.0000 mL | INTRAVENOUS | Status: DC
  Administered 2014-08-21: 500 mL via INTRAVENOUS

## 2014-08-21 MED ORDER — METOPROLOL SUCCINATE ER 50 MG PO TB24: 50.0000 mg | ORAL_TABLET | Freq: Every day | ORAL | Status: DC

## 2014-08-21 MED ORDER — PROPOFOL 10 MG/ML IV BOLUS
INTRAVENOUS | Status: DC | PRN
  Administered 2014-08-21: 80 mg via INTRAVENOUS

## 2014-08-21 MED ORDER — SODIUM CHLORIDE 0.9 % IJ SOLN: 3.0000 mL | Freq: Two times a day (BID) | INTRAMUSCULAR | Status: DC

## 2014-08-21 MED ORDER — SODIUM CHLORIDE 0.9 % IJ SOLN: 3.0000 mL | INTRAMUSCULAR | Status: DC | PRN

## 2014-08-21 NOTE — Discharge Instructions (Signed)

## 2014-08-21 NOTE — Transfer of Care (Signed)
Immediate Anesthesia Transfer of Care Note  Patient: Nathaniel [name]  Procedure(s) Performed: Procedure(s): CARDIOVERSION (N/A)  Patient Location: Endoscopy Unit  Anesthesia Type:General  Level of Consciousness: awake, alert  and oriented  Airway & Oxygen Therapy: Patient Spontanous Breathing  Post-op Assessment: Report given to PACU RN, Post -op Vital signs reviewed and stable and Patient moving all extremities X 4  Post vital signs: Reviewed and stable  Complications: No apparent anesthesia complications

## 2014-08-21 NOTE — Interval H&P Note (Signed)
History and Physical Interval Note:  08/21/2014 12:46 PM  Nathaniel [name]  has presented today for surgery, with the diagnosis of AFIB  The various methods of treatment have been discussed with the patient and family. After consideration of risks, benefits and other options for treatment, the patient has consented to  Procedure(s): CARDIOVERSION (N/A) as a surgical intervention .  The patient's history has been reviewed, patient examined, no change in status, stable for surgery.  I have reviewed the patient's chart and labs.  Questions were answered to the patient's satisfaction.     [name]

## 2014-08-21 NOTE — Procedures (Signed)
Electrical Cardioversion Procedure Note [facility] VIVERETTE [id number] January 16, 1932  Procedure: Electrical Cardioversion Indications:  Atrial Fibrillation.  INR has been therapeutic x 4 weeks.  INR 3.2 today.   Procedure Details Consent: Risks of procedure as well as the alternatives and risks of each were explained to the (patient/caregiver).  Consent for procedure obtained. Time Out: Verified patient identification, verified procedure, site/side was marked, verified correct patient position, special equipment/implants available, medications/allergies/relevent history reviewed, required imaging and test results available.  Performed  Patient placed on cardiac monitor, pulse oximetry, supplemental oxygen as necessary.  Sedation given: Propofol per anesthesiology Pacer pads placed anterior and posterior chest.  Cardioverted 1 time(s).  Cardioverted at [place].  Evaluation Findings: Post procedure EKG shows: NSR Complications: None Patient did tolerate procedure well.  HR in upper 40s/lower 50s.  I will decrease Toprol XL to 50 mg daily rather than 50 qam/25 qpm.    [name] 08/21/2014, 12:53 PM

## 2014-08-21 NOTE — Anesthesia Procedure Notes (Signed)
Date/Time: 08/21/2014 12:49 PM Performed by: Carola Frost Pre-anesthesia Checklist: Patient being monitored, Emergency Drugs available, Suction available, Timeout performed and Patient identified Patient Re-evaluated:Patient Re-evaluated prior to inductionOxygen Delivery Method: Simple face mask Intubation Type: IV induction Placement Confirmation: breath sounds checked- equal and bilateral Dental Injury: Teeth and Oropharynx as per pre-operative assessment

## 2014-08-21 NOTE — H&P (View-Only) (Signed)
Patient ID: Nathaniel [name], male   DOB: December 10, 1932, 80 y.o.   MRN: [id number] PCP: Dr. Elyse Hsu  80 yo with history of extensive vascular disease including CAD s/p CABG and redo CABG, AAA s/p repair, PAD, and carotid stenosis as well as prior AVR presents for cardiology followup.   He has had documentation of significant peripheral arterial disease.  He gets bilateral calf soreness after walking for about 15 minutes.  This resolves with rest, no rest pain.  No pedal ulcers, though when he gets cuts on his feet, they heal slowly.  Peripheral arterial dopplers done in 10/14 showed > 50% focal SFA stenoses bilaterally.  He has at least moderate right common carotid stenosis that may be nearing surgical range.  No stroke-like symptoms.  He had a recent endovascular AAA repair in 5/15.    After his AAA repair, he was noted to be in atrial fibrillation.  He had had a prior episode of atrial fibrillation after his cardiac surgery in 2007.  Atrial fibrillation was rate-controlled.  He was started on warfarin and Toprol XL.  He developed exertional dyspnea and fatigue in atrial fibrillation. I cardioverted him back to NSR on 06/06/14.  I also had him get an echocardiogram in 6/15.  This showed EF worsened to 25-30% with stable bioprosthetic aortic valve, mildly dilated RV with normal systolic function.  Given the fall in EF, I took him for [facility] in 7/15.  This showed stable anatomy.  The LIMA-LAD was patent, the sequential SVG-OM/ramus/D was patent only to ramus but this is the same as the prior cath, and the SVG-PDA was patent with a long 50-60% mid-graft stenosis.  No intervention.  Of note, at cath he was noted to be back in atrial fibrillation and is in atrial fibrillation today.   I started him on amiodarone at the time of his cardiac cath with plan to eventually re-attempt cardioversion while on an anti-arrhythmic.  He had a sensation of "jitteriness" on amiodarone 200 mg bid but this has resolved since I have  decreased his amiodarone to 200 mg daily.  INR has been therapeutic since 8/14. He is short of breath walking up a hill but does ok on flat ground.  He fatigues easily and it is hard to get through 18 holes of golf.  No chest pain, no orthopnea/PND.   He is still not back on atorvastatin.  He is taking Lasix 40 mg daily alternating with 20 mg daily. Weight is down 11 lbs.   ECG: atrial fibrillation at 90, right axis deviation, anterolateral and inferior TWIs  Labs (5/15): K 4.3, creatinine 0.89 Labs (6/15): K 4.3, creatinine 1.4, BNP 112 Labs (7/15): K 4.4, creatinine 1.2, HCT 40.2 Labs (8/15): K 4, creatinine 1.2, BNP 113, LFTs and TSH normal  PMH: 1. PAD: Peripheral arterial dopplers in 2012 showed > 75% bilateral SFA stenosis. Peripheral arterial dopplers in 10/14 showed > 50% focal bilateral SFA stenoses.  2. AAA: [place] 4/15 with 4.4 cm AAA but concern for penetrating ulcers. CTA abdomen showed 4.4 cm AAA with penetrating ulcers and possible pseudoaneurysms. Now s/p endovascular repair of AAA in 8/34 with no complications.  3. Carotid stenosis: Carotid dopplers (4/15) with 60-79% bilateral ICA stenosis, RICA may be near surgical range.  4. CAD: CABG 1989 with LIMA-LAD, SVG-D, seq SVG-ramus and OM, sequential SVG-PDA/PLV. SVG-PDA/PLV found to be occluded on cath prior to AVR, so patient had SVG-PDA with AVR in 2007. LHC (7/15) with LIMA-LAD patent with 40-50% stenosis in  LAD after touchdown, sequential SVG-ramus/OM/diagonal with only the ramus branch still intact (known from prior cath), SVG-PDA from original surgery TO, SVG-PDA from redo surgery with long 50-60% mid-graft stenosis.  No target for intervention.  5. Severe aortic stenosis: Bioprosthetic AVR in 2007. Echo (11/10) with EF 60-65%, basal to mid inferior hypokinesis, bioprosthetic aortic valve without significant stenosis/regurgitation, mild MR. Echo (11/14) with EF 55-60%, mild LVH, bioprosthetic aortic valve looked ok. Bioprosthetic  valve looked ok on 6/15 echo.   6. Atrial fibrillation: Paroxysmal. Initially noted after cardiac surgery, then again after AAA repair.  DCCV to NSR 06/06/14.  7. Type II diabetes  8. Gout  9. GERD  10. Hyperlipidemia  11. HTN 12. GERD  13. Chronic systolic CHF: Echo (5/10) with EF worsened to 25-30% with grade II diastolic dysfunction, stable bioprosthetic aortic valve, mildly dilated RV with normal systolic function.    SH: Widower, 2 children, raised his 3 grand-daughters, lives in [place], [place].  Occasional ETOH, no smoking.   FH: Father died from ruptured AAA.   ROS: All systems reviewed and negative except as per HPI.   Current Outpatient Prescriptions  Medication Sig Dispense Refill  . allopurinol (ZYLOPRIM) 100 MG tablet Take 100 mg by mouth daily.        Marland Kitchen amiodarone (PACERONE) 200 MG tablet Take 1 tablet (200 mg total) by mouth daily.      . beta carotene w/minerals (OCUVITE) tablet Take 1 tablet by mouth daily.      . Coenzyme Q10 (CO Q 10) 100 MG CAPS Take 100 mg by mouth daily.       Marland Kitchen doxazosin (CARDURA) 1 MG tablet Take by mouth. Take 2 tablets in the morning and take 1 tablet in the evening      . furosemide (LASIX) 40 MG tablet Take 1 tablet (40 mg total) by mouth daily.  30 tablet  11  . glucosamine-chondroitin 500-400 MG tablet Take 1 tablet by mouth daily.      Marland Kitchen JANUVIA 100 MG tablet Take 1 tablet by mouth daily.      . Omega-3 Fatty Acids (FISH OIL) 1000 MG CAPS Takes 2 in the morning and 1 in the evening      . potassium chloride SA (K-DUR,KLOR-CON) 20 MEQ tablet Take 20 mEq by mouth daily.      . ramipril (ALTACE) 5 MG capsule Take 5 mg by mouth daily.      Marland Kitchen spironolactone (ALDACTONE) 25 MG tablet Take 1 tablet (25 mg total) by mouth daily.  30 tablet  11  . warfarin (COUMADIN) 3 MG tablet USE AS DIRECTED BY ANTICOAGULATION CLINIC  70 tablet  3  . ZETIA 10 MG tablet Take 10 mg by mouth daily.       Marland Kitchen atorvastatin (LIPITOR) 20 MG tablet Take 1 tablet  (20 mg total) by mouth daily.  30 tablet  6  . metoprolol succinate (TOPROL-XL) 50 MG 24 hr tablet 1 tablet (50mg ) and 1/2 tablet (25mg ) PM Take with or immediately following a meal.  45 tablet  6   No current facility-administered medications for this visit.    BP 115/70  Pulse 90  Ht 5\' 10"  (1.778 m)  Wt 182 lb (82.555 kg)  BMI 26.11 kg/m2 General: NAD Neck: JVP 7 cm, no thyromegaly or thyroid nodule.  Lungs: Clear to auscultation bilaterally with normal respiratory effort. CV: Nondisplaced PMI.  Heart irregular S1/S2, no S3/S4, 2/6 early SEM RUSB.  No edema bilaterally.  Soft right carotid  bruit.  1+ left PT, unable to feel right PT.   Abdomen: Soft, nontender, no hepatosplenomegaly, no distention. Ventral hernia.  Skin: Intact without lesions or rashes.  Neurologic: Alert and oriented x 3.  Psych: Normal affect. Extremities: No clubbing or cyanosis.   Assessment/Plan:  1. CAD: s/p CABG and redo CABG.  Given recent fall in EF, I did a cardiac cath in 7/15 as documented above. Unfortunately, despite significant coronary disease, there were no good interventional targets. - Continue statin (needs to restart atorvastatin today), ramipril.   - He is on warfarin in setting of stable CAD so not on ASA 81.  2. Hyperlipidemia: He should have aggressive control given extensive vascular disease.  Restart atorvastatin today (stopped by mistake), check lipids in 2 months.  3. PAD: Patient has moderate stable claudication symptoms.  No rest symptoms or foot ulcers. Peripheral arterial dopplers showed > 50% bilateral focal SFA stenoses.  I have talked to the patient about a trial of cilostazol +/- PV referral.  At the time, he did not want either.  We have talked about trying to walk through the pain for a short distance as this may help encourage collaterals.  Symptoms have been stable and is now followed by [facility].  4. Carotid stenosis: Most recent carotid dopplers were concerning for RICA stenosis  nearing surgical range.  He has had no stroke-like symptoms.  He has seen Dr. Trula Slade and will be getting carotid dopplers in followup at [facility].  5. AAA: Successful endovascular repair in 5/15.  6. Bioprosthetic AVR: Valve stable on 6/15 echo.  7. Atrial fibrillation: He has been cardioverted once but is back in atrial fibrillation now.  I suspect that he will not hold NSR without an antiarrhyhmic; he is now on amiodarone.  Given the bioprosthetic aortic valve and his CAD, I ended up thinking that warfarin is a better choice than NOAC (he is now on warfarin).  He has symptomatic fatigue and dyspnea while in atrial fibrillation, so ideally we would keep him out.  He is tolerating amiodarone at 200 mg daily.  - Recent TSH and LFTs normal.  He will need a yearly eye exam on amiodarone. - When INR has been therapeutic x 4 wks, repeat cardioversion on amiodarone => will need 2 more weeks.  - Increase Toprol XL to 50 qam/25 qpm (he is still taking Toprol XL 25 mg bid). 8. Chronic systolic CHF: Suspect ischemic cardiomyopathy.  EF now 25-30%, was normal on prior echo in 11/14.  As his atrial fibrillation has not been persistently fast, I cannot blame this on tachycardia-mediated cardiomyopathy.  LHC showed significant coronary disease but no interventional option. NYHA class II-III symptoms now, stable.  He does not look volume overloaded today.  - Continue Lasix 40 mg daily alternating with 20 mg daily.  - Continue ramipril but increase Toprol XL as above.  - Continue spironolactone 25 mg daily.  K has been ok.    [name] 08/04/2014

## 2014-08-21 NOTE — Anesthesia Postprocedure Evaluation (Signed)
  Anesthesia Post-op Note  Patient: Nathaniel [name]  Procedure(s) Performed: Procedure(s): CARDIOVERSION (N/A)  Patient Location: PACU  Anesthesia Type:General  Level of Consciousness: awake, alert  and oriented  Airway and Oxygen Therapy: Patient Spontanous Breathing  Post-op Pain: none  Post-op Assessment: Post-op Vital signs reviewed, Patient's Cardiovascular Status Stable, Respiratory Function Stable, Patent Airway, No signs of Nausea or vomiting and Pain level controlled  Post-op Vital Signs: stable  Last Vitals:  Filed Vitals:   08/21/14 1310  BP: 108/65  Pulse: 49  Resp: 21    Complications: No apparent anesthesia complications

## 2014-08-21 NOTE — Anesthesia Preprocedure Evaluation (Addendum)
Anesthesia Evaluation  Patient identified by MRN, date of birth, ID band Patient awake    Reviewed: Allergy & Precautions, H&P , NPO status , Patient's Chart, lab work & pertinent test results, reviewed documented beta blocker date and time   Airway Mallampati: II TM Distance: >3 FB Neck ROM: Full    Dental  (+) Dental Advisory Given   Pulmonary shortness of breath, former smoker,  breath sounds clear to auscultation        Cardiovascular hypertension, Pt. on medications and Pt. on home beta blockers + CAD, + CABG, + Peripheral Vascular Disease and +CHF + dysrhythmias Atrial Fibrillation Rhythm:Regular Rate:Normal     Neuro/Psych    GI/Hepatic GERD-  ,  Endo/Other  diabetes  Renal/GU      Musculoskeletal   Abdominal   Peds  Hematology   Anesthesia Other Findings   Reproductive/Obstetrics                         Anesthesia Physical Anesthesia Plan  ASA: III  Anesthesia Plan: General   Post-op Pain Management:    Induction: Intravenous  Airway Management Planned: Mask  Additional Equipment:   Intra-op Plan:   Post-operative Plan:   Informed Consent: I have reviewed the patients History and Physical, chart, labs and discussed the procedure including the risks, benefits and alternatives for the proposed anesthesia with the patient or authorized representative who has indicated his/her understanding and acceptance.   Dental advisory given  Plan Discussed with: Anesthesiologist, Surgeon and CRNA  Anesthesia Plan Comments:        Anesthesia Quick Evaluation

## 2014-08-22 ENCOUNTER — Encounter (HOSPITAL_COMMUNITY): Payer: Self-pay | Admitting: Cardiology

## 2014-08-28 ENCOUNTER — Ambulatory Visit (INDEPENDENT_AMBULATORY_CARE_PROVIDER_SITE_OTHER): Payer: Medicare Other | Admitting: *Deleted

## 2014-08-28 DIAGNOSIS — I4891 Unspecified atrial fibrillation: Secondary | ICD-10-CM

## 2014-08-28 LAB — POCT INR: INR: 3.5

## 2014-09-03 ENCOUNTER — Encounter: Payer: Self-pay | Admitting: Internal Medicine

## 2014-09-06 ENCOUNTER — Telehealth: Payer: Self-pay | Admitting: *Deleted

## 2014-09-06 ENCOUNTER — Ambulatory Visit (INDEPENDENT_AMBULATORY_CARE_PROVIDER_SITE_OTHER): Payer: Medicare Other | Admitting: Cardiology

## 2014-09-06 ENCOUNTER — Encounter: Payer: Self-pay | Admitting: Cardiology

## 2014-09-06 ENCOUNTER — Ambulatory Visit (INDEPENDENT_AMBULATORY_CARE_PROVIDER_SITE_OTHER): Payer: Medicare Other | Admitting: Pharmacist

## 2014-09-06 VITALS — BP 116/52 | HR 48 | Ht 70.0 in | Wt 193.0 lb

## 2014-09-06 DIAGNOSIS — I480 Paroxysmal atrial fibrillation: Secondary | ICD-10-CM

## 2014-09-06 DIAGNOSIS — Z952 Presence of prosthetic heart valve: Secondary | ICD-10-CM | POA: Diagnosis not present

## 2014-09-06 DIAGNOSIS — I25810 Atherosclerosis of coronary artery bypass graft(s) without angina pectoris: Secondary | ICD-10-CM

## 2014-09-06 DIAGNOSIS — R0602 Shortness of breath: Secondary | ICD-10-CM

## 2014-09-06 DIAGNOSIS — I509 Heart failure, unspecified: Secondary | ICD-10-CM

## 2014-09-06 DIAGNOSIS — Z953 Presence of xenogenic heart valve: Secondary | ICD-10-CM

## 2014-09-06 DIAGNOSIS — I208 Other forms of angina pectoris: Secondary | ICD-10-CM

## 2014-09-06 DIAGNOSIS — I5022 Chronic systolic (congestive) heart failure: Secondary | ICD-10-CM

## 2014-09-06 DIAGNOSIS — Z5181 Encounter for therapeutic drug level monitoring: Secondary | ICD-10-CM

## 2014-09-06 DIAGNOSIS — I4891 Unspecified atrial fibrillation: Secondary | ICD-10-CM

## 2014-09-06 LAB — BASIC METABOLIC PANEL
BUN: 28 mg/dL — AB (ref 6–23)
CALCIUM: 8.9 mg/dL (ref 8.4–10.5)
CHLORIDE: 102 meq/L (ref 96–112)
CO2: 29 mEq/L (ref 19–32)
Creatinine, Ser: 1.5 mg/dL (ref 0.4–1.5)
GFR: 48.18 mL/min — AB (ref >60.00)
Glucose, Bld: 138 mg/dL — ABNORMAL HIGH (ref 70–99)
Potassium: 4.8 mEq/L (ref 3.5–5.1)
Sodium: 136 mEq/L (ref 135–145)

## 2014-09-06 LAB — POCT INR: INR: 4.2

## 2014-09-06 LAB — BRAIN NATRIURETIC PEPTIDE: Pro B Natriuretic peptide (BNP): 191 pg/mL — ABNORMAL HIGH (ref 0.0–100.0)

## 2014-09-06 NOTE — Patient Instructions (Signed)
Take Zantac 150mg  at bedtime. It is OK to take 2 of a 75mg  tablet at bedtime. You can get this without a prescription.   Your physician recommends that you have lab work today--BMET/BNP.  Your physician recommends that you schedule a follow-up appointment in: 3 months with Dr Aundra Dubin. This is scheduled for Thursday December 10,2015 at 9:45AM

## 2014-09-06 NOTE — Progress Notes (Signed)
Patient ID: Nathaniel [name], male   DOB: [birth year], 80 y.o.   MRN: [id number] PCP: Dr. Elyse [name]  80 yo with history of extensive vascular disease including CAD s/p CABG and redo CABG, AAA s/p repair, PAD, and carotid stenosis as well as prior AVR presents for cardiology followup.   He has had documentation of significant peripheral arterial disease.  He gets bilateral calf soreness after walking for about 15 minutes.  This resolves with rest, no rest pain.  No pedal ulcers, though when he gets cuts on his feet, they heal slowly.  Peripheral arterial dopplers done in 10/14 showed > 50% focal SFA stenoses bilaterally.  He has at least moderate right common carotid stenosis that may be nearing surgical range.  No stroke-like symptoms.  He had a recent endovascular AAA repair in 5/15.    After his AAA repair, he was noted to be in atrial fibrillation.  He had had a prior episode of atrial fibrillation after his cardiac surgery in 2007.  Atrial fibrillation was rate-controlled.  He was started on warfarin and Toprol XL.  He developed exertional dyspnea and fatigue in atrial fibrillation. I cardioverted him back to NSR on 06/06/14.  I also had him get an echocardiogram in 6/15.  This showed EF worsened to 25-30% with stable bioprosthetic aortic valve, mildly dilated RV with normal systolic function.  Given the fall in EF, I took him for [facility] in 7/15.  This showed stable anatomy.  The LIMA-LAD was patent, the sequential SVG-OM/ramus/D was patent only to ramus but this is the same as the prior cath, and the SVG-PDA was patent with a long 50-60% mid-graft stenosis.  No intervention.  Of note, at cath he was noted to be back in atrial fibrillation.  I then started him on amiodarone.   I took Mr Nathaniel [name] for [place] again on 08/21/14.  This was successful and he remains in NSR today.  He is feeling pretty good overall.  Weight is up, but he ate at the [place] for 3 nights in a row.  No exertional dyspnea or chest pain.  He  occasionally wakes up with reflux symptoms when lying in bed at night. No lightheadedness with standing.   ECG: NSR at 48, right-ward axis, iLBBB  Labs (5/15): K 4.3, creatinine 0.89 Labs (6/15): K 4.3, creatinine 1.4, BNP 112 Labs (7/15): K 4.4, creatinine 1.2, HCT 40.2 Labs (8/15): K 4, creatinine 1.2, BNP 113, LFTs and TSH normal  PMH: 1. PAD: Peripheral arterial dopplers in 2012 showed > 75% bilateral SFA stenosis. Peripheral arterial dopplers in 10/14 showed > 50% focal bilateral SFA stenoses.  2. AAA: [place] 4/15 with 4.4 cm AAA but concern for penetrating ulcers. CTA abdomen showed 4.4 cm AAA with penetrating ulcers and possible pseudoaneurysms. Now s/p endovascular repair of AAA in 4/09 with no complications.  3. Carotid stenosis: Carotid dopplers (4/15) with 60-79% bilateral ICA stenosis, RICA may be near surgical range.  4. CAD: CABG 1989 with LIMA-LAD, SVG-D, seq SVG-ramus and OM, sequential SVG-PDA/PLV. SVG-PDA/PLV found to be occluded on cath prior to AVR, so patient had SVG-PDA with AVR in 2007. LHC (7/15) with LIMA-LAD patent with 40-50% stenosis in LAD after touchdown, sequential SVG-ramus/OM/diagonal with only the ramus branch still intact (known from prior cath), SVG-PDA from original surgery TO, SVG-PDA from redo surgery with long 50-60% mid-graft stenosis.  No target for intervention.  5. Severe aortic stenosis: Bioprosthetic AVR in 2007. Echo (11/10) with EF 60-65%, basal to mid inferior hypokinesis,  bioprosthetic aortic valve without significant stenosis/regurgitation, mild MR. Echo (11/14) with EF 55-60%, mild LVH, bioprosthetic aortic valve looked ok. Bioprosthetic valve looked ok on 6/15 echo.   6. Atrial fibrillation: Paroxysmal. Initially noted after cardiac surgery, then again after AAA repair.  DCCV to NSR 06/06/14.  DCCV to NSR again 08/21/14.  7. Type II diabetes  8. Gout  9. GERD  10. Hyperlipidemia  11. HTN 12. GERD  13. Chronic systolic CHF: Echo (2/02) with EF  worsened to 25-30% with grade II diastolic dysfunction, stable bioprosthetic aortic valve, mildly dilated RV with normal systolic function.    SH: Widower, 2 children, raised his 3 grand-daughters, lives in [place], [place].  Occasional ETOH, no smoking.   FH: Father died from ruptured AAA.   ROS: All systems reviewed and negative except as per HPI.   Current Outpatient Prescriptions  Medication Sig Dispense Refill  . allopurinol (ZYLOPRIM) 100 MG tablet Take 100 mg by mouth daily.        Marland Kitchen amiodarone (PACERONE) 200 MG tablet Take 1 tablet (200 mg total) by mouth daily.      Marland Kitchen atorvastatin (LIPITOR) 20 MG tablet Take 1 tablet (20 mg total) by mouth daily.  30 tablet  6  . beta carotene w/minerals (OCUVITE) tablet Take 1 tablet by mouth daily.      . Coenzyme Q10 (CO Q 10) 100 MG CAPS Take 100 mg by mouth daily.       Marland Kitchen doxazosin (CARDURA) 1 MG tablet Take 1-2 mg by mouth 2 (two) times daily. Take 2 tablets in the morning and take 1 tablet in the evening      . furosemide (LASIX) 40 MG tablet Take 1 tablet (40 mg total) by mouth daily.  30 tablet  11  . glucosamine-chondroitin 500-400 MG tablet Take 1 tablet by mouth daily.      Marland Kitchen JANUVIA 100 MG tablet Take 100 mg by mouth daily.       . metoprolol succinate (TOPROL XL) 50 MG 24 hr tablet Take 1 tablet (50 mg total) by mouth daily. Take with or immediately following a meal.  90 tablet  3  . Omega-3 Fatty Acids (FISH OIL) 1000 MG CAPS Take 1,000-2,000 mg by mouth 2 (two) times daily. Take 2000 mg in the morning and 1000 mg in the evening      . potassium chloride SA (K-DUR,KLOR-CON) 20 MEQ tablet Take 20 mEq by mouth daily.      . ramipril (ALTACE) 5 MG capsule Take 5 mg by mouth daily.      Marland Kitchen spironolactone (ALDACTONE) 25 MG tablet Take 1 tablet (25 mg total) by mouth daily.  30 tablet  11  . warfarin (COUMADIN) 3 MG tablet Take 6 mg by mouth daily.      Marland Kitchen ZETIA 10 MG tablet Take 10 mg by mouth daily.       . ranitidine (ZANTAC) 150 MG  tablet 1 tablet at bedtime       No current facility-administered medications for this visit.    BP 116/52  Pulse 48  Ht 5\' 10"  (1.778 m)  Wt 193 lb (87.544 kg)  BMI 27.69 kg/m2 General: NAD Neck: JVP 7 cm, no thyromegaly or thyroid nodule.  Lungs: Clear to auscultation bilaterally with normal respiratory effort. CV: Nondisplaced PMI.  Heart irregular S1/S2, no S3/S4, 2/6 early SEM RUSB.  No edema bilaterally.  Soft right carotid bruit.  1+ left PT, unable to feel right PT.   Abdomen: Soft,  nontender, no hepatosplenomegaly, no distention. Ventral hernia.  Skin: Intact without lesions or rashes.  Neurologic: Alert and oriented x 3.  Psych: Normal affect. Extremities: No clubbing or cyanosis.   Assessment/Plan:  1. CAD: s/p CABG and redo CABG.  Given recent fall in EF, I did a cardiac cath in 7/15 as documented above. Unfortunately, despite significant coronary disease, there were no good interventional targets. - Continue statin, ramipril, Toprol XL.   - He is on warfarin in setting of stable CAD so not on ASA 81.  2. Hyperlipidemia: He should have aggressive control given extensive vascular disease.  Check lipids next appointment. 3. PAD: Patient has moderate stable claudication symptoms.  No rest symptoms or foot ulcers. Peripheral arterial dopplers showed > 50% bilateral focal SFA stenoses.  I have talked to the patient about PV referral (not cilostazol candidate with CHF).  At the time, he wanted to hold off.  We have talked about trying to walk through the pain for a short distance as this may help encourage collaterals. Symptoms have been stable and is now followed by [facility].  4. Carotid stenosis: Most recent carotid dopplers were concerning for RICA stenosis nearing surgical range.  He has had no stroke-like symptoms.  He has seen Dr. Trula Slade and will be getting carotid dopplers in followup at [facility].  5. AAA: Successful endovascular repair in 5/15.  6. Bioprosthetic AVR: Valve stable on  6/15 echo.  7. Atrial fibrillation: He has been cardioverted twice and is now holding NSR on amiodarone.  Given the bioprosthetic aortic valve and his CAD, I ended up thinking that warfarin is a better choice than NOAC (he is now on warfarin).  He has symptomatic fatigue and dyspnea while in atrial fibrillation, so ideally we would keep him out.  He is tolerating amiodarone at 200 mg daily.  - Recent TSH and LFTs normal.  He will need a yearly eye exam on amiodarone. - Continue current Toprol XL.  He is mildly bradycardic, will follow. 8. Chronic systolic CHF: Suspect ischemic cardiomyopathy.  EF now 25-30%, was normal on prior echo in 11/14.  As his atrial fibrillation has not been persistently fast, I cannot blame this on tachycardia-mediated cardiomyopathy.  LHC showed significant coronary disease but no interventional option. NYHA class II symptoms now, stable.  He does not look volume overloaded today.  - Continue Lasix 40 mg daily.  - Continue ramipril, spironolactone, and Toprol XL.   - BMET/BNP today.   9. GERD: Would try Zantac use before bed.   Followup in 3 months.   [name] 09/06/2014

## 2014-09-06 NOTE — Telephone Encounter (Signed)
Notes Recorded by Larey Dresser, MD on 09/06/2014  Creatinine a little higher. Have him start alternating Lasix 40 mg daily with Lasix 20 mg daily and get BMET in 2 wks.

## 2014-09-11 DIAGNOSIS — E1169 Type 2 diabetes mellitus with other specified complication: Secondary | ICD-10-CM | POA: Diagnosis not present

## 2014-09-11 DIAGNOSIS — E782 Mixed hyperlipidemia: Secondary | ICD-10-CM | POA: Diagnosis not present

## 2014-09-14 ENCOUNTER — Ambulatory Visit (INDEPENDENT_AMBULATORY_CARE_PROVIDER_SITE_OTHER): Payer: Medicare Other | Admitting: *Deleted

## 2014-09-14 DIAGNOSIS — I4891 Unspecified atrial fibrillation: Secondary | ICD-10-CM | POA: Diagnosis not present

## 2014-09-14 LAB — POCT INR: INR: 2.9

## 2014-09-18 DIAGNOSIS — E1159 Type 2 diabetes mellitus with other circulatory complications: Secondary | ICD-10-CM | POA: Diagnosis not present

## 2014-09-18 DIAGNOSIS — I2510 Atherosclerotic heart disease of native coronary artery without angina pectoris: Secondary | ICD-10-CM | POA: Diagnosis not present

## 2014-09-18 DIAGNOSIS — M109 Gout, unspecified: Secondary | ICD-10-CM | POA: Diagnosis not present

## 2014-09-18 DIAGNOSIS — I10 Essential (primary) hypertension: Secondary | ICD-10-CM | POA: Diagnosis not present

## 2014-09-18 DIAGNOSIS — I70203 Unspecified atherosclerosis of native arteries of extremities, bilateral legs: Secondary | ICD-10-CM | POA: Diagnosis not present

## 2014-09-18 DIAGNOSIS — E782 Mixed hyperlipidemia: Secondary | ICD-10-CM | POA: Diagnosis not present

## 2014-09-18 DIAGNOSIS — Z23 Encounter for immunization: Secondary | ICD-10-CM | POA: Diagnosis not present

## 2014-09-18 DIAGNOSIS — E1165 Type 2 diabetes mellitus with hyperglycemia: Secondary | ICD-10-CM | POA: Diagnosis not present

## 2014-09-20 ENCOUNTER — Other Ambulatory Visit (INDEPENDENT_AMBULATORY_CARE_PROVIDER_SITE_OTHER): Payer: Medicare Other | Admitting: *Deleted

## 2014-09-20 DIAGNOSIS — I480 Paroxysmal atrial fibrillation: Secondary | ICD-10-CM

## 2014-09-20 DIAGNOSIS — I5022 Chronic systolic (congestive) heart failure: Secondary | ICD-10-CM | POA: Diagnosis not present

## 2014-09-20 LAB — BASIC METABOLIC PANEL
BUN: 29 mg/dL — AB (ref 6–23)
CHLORIDE: 102 meq/L (ref 96–112)
CO2: 26 mEq/L (ref 19–32)
CREATININE: 1.4 mg/dL (ref 0.4–1.5)
Calcium: 8.8 mg/dL (ref 8.4–10.5)
GFR: 53.08 mL/min — AB (ref >60.00)
Glucose, Bld: 183 mg/dL — ABNORMAL HIGH (ref 70–99)
Potassium: 4.4 mEq/L (ref 3.5–5.1)
Sodium: 136 mEq/L (ref 135–145)

## 2014-09-28 ENCOUNTER — Ambulatory Visit (INDEPENDENT_AMBULATORY_CARE_PROVIDER_SITE_OTHER): Payer: Medicare Other | Admitting: *Deleted

## 2014-09-28 ENCOUNTER — Encounter: Payer: Self-pay | Admitting: *Deleted

## 2014-09-28 ENCOUNTER — Telehealth: Payer: Self-pay | Admitting: *Deleted

## 2014-09-28 DIAGNOSIS — I4891 Unspecified atrial fibrillation: Secondary | ICD-10-CM

## 2014-09-28 LAB — POCT INR: INR: 1.6

## 2014-09-28 NOTE — Telephone Encounter (Signed)
OK to do the exercises.  Probably no good alternative to amiodarone that I can add.  Cannot increase Toprol XL with low HR.

## 2014-09-28 NOTE — Telephone Encounter (Signed)
Message copied from documentation note earlier today: Patient was here for Coumadin and wanted to relate the message he has stopped his Amiodarone 5 days ago. There is a message left with Coumadin notes as well. Will forward message to [place] and Desiree Lucy RN    New message by Eliot Ford: I spoke with patient and he stopped amiodarone about 5 days ago because of hallucinations.  He states his symptoms resolved since stopping amiodarone.   I will forward to Dr Aundra Dubin for review.

## 2014-09-28 NOTE — Telephone Encounter (Signed)
Pt advised. Pt would like to know if Dr Aundra Dubin has recommendations for increasing his chance of staying out of at fib since he cannot tolerated amiodarone. He is currently bench pressing up to 100 pounds and is asking if OK to continue this. He also uses up to 50 pounds on machines and is asking if it is OK to continue this.   To Dr Aundra Dubin for review.

## 2014-09-28 NOTE — Telephone Encounter (Signed)
If the hallucinations got better off amiodarone, I guess we'll have to leave him off.  Fairly good chance he will go back into atrial fibrillation.

## 2014-09-28 NOTE — Patient Instructions (Signed)
Patient was here for Coumadin and wanted to relate the message he has stopped his Amiodarone 5 days ago. There is a message left with Coumadin notes as well. Will forward message to [place] and Desiree Lucy RN

## 2014-09-28 NOTE — Telephone Encounter (Signed)
NA

## 2014-10-01 NOTE — Telephone Encounter (Signed)
Pt advised not to bench press 100 pounds free weight, but could use less weight with machine assist per Dr Aundra Dubin.

## 2014-10-09 ENCOUNTER — Other Ambulatory Visit (HOSPITAL_COMMUNITY): Payer: Self-pay | Admitting: *Deleted

## 2014-10-09 DIAGNOSIS — I6523 Occlusion and stenosis of bilateral carotid arteries: Secondary | ICD-10-CM

## 2014-10-12 ENCOUNTER — Ambulatory Visit (INDEPENDENT_AMBULATORY_CARE_PROVIDER_SITE_OTHER): Payer: Medicare Other | Admitting: Pharmacist

## 2014-10-12 DIAGNOSIS — I4891 Unspecified atrial fibrillation: Secondary | ICD-10-CM | POA: Diagnosis not present

## 2014-10-12 LAB — POCT INR: INR: 3.4

## 2014-10-17 ENCOUNTER — Ambulatory Visit (HOSPITAL_COMMUNITY): Payer: Medicare Other | Attending: Cardiovascular Disease | Admitting: *Deleted

## 2014-10-17 DIAGNOSIS — I10 Essential (primary) hypertension: Secondary | ICD-10-CM | POA: Diagnosis not present

## 2014-10-17 DIAGNOSIS — I6523 Occlusion and stenosis of bilateral carotid arteries: Secondary | ICD-10-CM | POA: Diagnosis not present

## 2014-10-17 DIAGNOSIS — Z87891 Personal history of nicotine dependence: Secondary | ICD-10-CM | POA: Diagnosis not present

## 2014-10-17 DIAGNOSIS — Z951 Presence of aortocoronary bypass graft: Secondary | ICD-10-CM | POA: Insufficient documentation

## 2014-10-17 DIAGNOSIS — E119 Type 2 diabetes mellitus without complications: Secondary | ICD-10-CM | POA: Insufficient documentation

## 2014-10-17 DIAGNOSIS — I2510 Atherosclerotic heart disease of native coronary artery without angina pectoris: Secondary | ICD-10-CM | POA: Diagnosis not present

## 2014-10-17 DIAGNOSIS — I739 Peripheral vascular disease, unspecified: Secondary | ICD-10-CM | POA: Diagnosis not present

## 2014-10-17 DIAGNOSIS — E785 Hyperlipidemia, unspecified: Secondary | ICD-10-CM | POA: Insufficient documentation

## 2014-10-17 NOTE — Progress Notes (Signed)
Carotid duplex complete 

## 2014-10-24 ENCOUNTER — Other Ambulatory Visit: Payer: Self-pay | Admitting: Cardiology

## 2014-10-26 ENCOUNTER — Ambulatory Visit (INDEPENDENT_AMBULATORY_CARE_PROVIDER_SITE_OTHER): Payer: Medicare Other | Admitting: *Deleted

## 2014-10-26 DIAGNOSIS — I4891 Unspecified atrial fibrillation: Secondary | ICD-10-CM | POA: Diagnosis not present

## 2014-10-26 LAB — POCT INR: INR: 3

## 2014-11-02 DIAGNOSIS — E119 Type 2 diabetes mellitus without complications: Secondary | ICD-10-CM | POA: Diagnosis not present

## 2014-11-02 DIAGNOSIS — H40013 Open angle with borderline findings, low risk, bilateral: Secondary | ICD-10-CM | POA: Diagnosis not present

## 2014-11-16 ENCOUNTER — Ambulatory Visit (INDEPENDENT_AMBULATORY_CARE_PROVIDER_SITE_OTHER): Payer: Medicare Other | Admitting: *Deleted

## 2014-11-16 DIAGNOSIS — I4891 Unspecified atrial fibrillation: Secondary | ICD-10-CM

## 2014-11-16 LAB — POCT INR: INR: 2.4

## 2014-11-22 ENCOUNTER — Encounter: Payer: Self-pay | Admitting: Cardiology

## 2014-11-22 ENCOUNTER — Telehealth: Payer: Self-pay | Admitting: Cardiology

## 2014-11-22 ENCOUNTER — Ambulatory Visit (INDEPENDENT_AMBULATORY_CARE_PROVIDER_SITE_OTHER): Payer: Medicare Other | Admitting: Cardiology

## 2014-11-22 VITALS — BP 132/82 | HR 66 | Ht 70.0 in | Wt 195.0 lb

## 2014-11-22 DIAGNOSIS — I5032 Chronic diastolic (congestive) heart failure: Secondary | ICD-10-CM

## 2014-11-22 DIAGNOSIS — I10 Essential (primary) hypertension: Secondary | ICD-10-CM | POA: Diagnosis not present

## 2014-11-22 DIAGNOSIS — I208 Other forms of angina pectoris: Secondary | ICD-10-CM

## 2014-11-22 DIAGNOSIS — E785 Hyperlipidemia, unspecified: Secondary | ICD-10-CM | POA: Diagnosis not present

## 2014-11-22 DIAGNOSIS — I25700 Atherosclerosis of coronary artery bypass graft(s), unspecified, with unstable angina pectoris: Secondary | ICD-10-CM

## 2014-11-22 DIAGNOSIS — I480 Paroxysmal atrial fibrillation: Secondary | ICD-10-CM

## 2014-11-22 DIAGNOSIS — R0600 Dyspnea, unspecified: Secondary | ICD-10-CM

## 2014-11-22 DIAGNOSIS — R0602 Shortness of breath: Secondary | ICD-10-CM

## 2014-11-22 DIAGNOSIS — I714 Abdominal aortic aneurysm, without rupture: Secondary | ICD-10-CM | POA: Diagnosis not present

## 2014-11-22 DIAGNOSIS — I5022 Chronic systolic (congestive) heart failure: Secondary | ICD-10-CM | POA: Diagnosis not present

## 2014-11-22 DIAGNOSIS — I4891 Unspecified atrial fibrillation: Secondary | ICD-10-CM | POA: Diagnosis not present

## 2014-11-22 DIAGNOSIS — I6523 Occlusion and stenosis of bilateral carotid arteries: Secondary | ICD-10-CM | POA: Diagnosis not present

## 2014-11-22 LAB — LIPID PANEL
Cholesterol: 180 mg/dL (ref 0–200)
HDL: 48.5 mg/dL (ref >39.00)
LDL Cholesterol: 99 mg/dL (ref 0–99)
NONHDL: 131.5
Total CHOL/HDL Ratio: 4
Triglycerides: 161 mg/dL — ABNORMAL HIGH (ref 0.0–149.0)
VLDL: 32.2 mg/dL (ref 0.0–40.0)

## 2014-11-22 LAB — BASIC METABOLIC PANEL
BUN: 19 mg/dL (ref 6–23)
CHLORIDE: 101 meq/L (ref 96–112)
CO2: 28 meq/L (ref 19–32)
CREATININE: 1.2 mg/dL (ref 0.4–1.5)
Calcium: 9.1 mg/dL (ref 8.4–10.5)
GFR: 60.08 mL/min (ref >60.00)
Glucose, Bld: 137 mg/dL — ABNORMAL HIGH (ref 70–99)
Potassium: 4.4 mEq/L (ref 3.5–5.1)
SODIUM: 134 meq/L — AB (ref 135–145)

## 2014-11-22 LAB — BRAIN NATRIURETIC PEPTIDE: Pro B Natriuretic peptide (BNP): 57 pg/mL (ref 0.0–100.0)

## 2014-11-22 MED ORDER — METOPROLOL SUCCINATE ER 50 MG PO TB24: ORAL_TABLET | ORAL | Status: DC

## 2014-11-22 NOTE — Telephone Encounter (Signed)
°                                         JUST A FYI  Patient came to check out he informed me that he was going to see his PCP next week and wanted to wait to schedule Echocardiogram. Hilda Blades will call next week and follow up with patient.   [employer]

## 2014-11-22 NOTE — Patient Instructions (Addendum)
Your physician wants you to follow-up in: 2 months with Dr. Aundra Dubin   Your physician recommends that you return for lab work today: Lipid/BMP/BNP  Your physician has requested that you have an echocardiogram. Echocardiography is a painless test that uses sound waves to create images of your heart. It provides your doctor with information about the size and shape of your heart and how well your heart's chambers and valves are working. This procedure takes approximately one hour. There are no restrictions for this procedure.    Your physician has recommended you make the following change in your medication:  1) Increase Mrtoprolol to 75mg  daily

## 2014-11-23 ENCOUNTER — Encounter: Payer: Self-pay | Admitting: Surgery

## 2014-11-24 NOTE — Progress Notes (Signed)
Patient ID: Nathaniel [name], male   DOB: 10-30-1932, 80 y.o.   MRN: [id number] PCP: Dr. Elyse Hsu  81 yo with history of extensive vascular disease including CAD s/p CABG and redo CABG, AAA s/p repair, PAD, and carotid stenosis as well as prior AVR presents for cardiology followup.   He has had documentation of significant peripheral arterial disease.  He gets bilateral calf soreness after walking for about 15 minutes.  This resolves with rest, no rest pain.  No pedal ulcers, though when he gets cuts on his feet, they heal slowly.  Peripheral arterial dopplers done in 10/14 showed > 50% focal SFA stenoses bilaterally.  He has at least moderate right common carotid stenosis that may be nearing surgical range.  No stroke-like symptoms.  He had a recent endovascular AAA repair in 5/15.    After his AAA repair, he was noted to be in atrial fibrillation.  He had had a prior episode of atrial fibrillation after his cardiac surgery in 2007.  Atrial fibrillation was rate-controlled.  He was started on warfarin and Toprol XL.  He developed exertional dyspnea and fatigue in atrial fibrillation. I cardioverted him back to NSR on 06/06/14.  I also had him get an echocardiogram in 6/15.  This showed EF worsened to 25-30% with stable bioprosthetic aortic valve, mildly dilated RV with normal systolic function.  Given the fall in EF, I took him for [facility] in 7/15.  This showed stable anatomy.  The LIMA-LAD was patent, the sequential SVG-OM/ramus/D was patent only to ramus but this is the same as the prior cath, and the SVG-PDA was patent with a long 50-60% mid-graft stenosis.  No intervention.  Of note, at cath he was noted to be back in atrial fibrillation.  I then started him on amiodarone.  I took Mr [place] for [place] again on 08/21/14.  This was successful and he remains in NSR today.    Since last appointment, he developed hallucinations and felt "funny."  He stopped amiodarone and this completely resolved.  Therefore, he has stayed  off amiodarone.  He is not short of breath walking on flat ground and can climb a flight of steps without much problem.  No chest pian.  No palpitations or lightheadedness.  Weight is up 2 lbs.    ECG: NSR, iLBBB, slight inferior and anterolateral ST depression  Labs (5/15): K 4.3, creatinine 0.89 Labs (6/15): K 4.3, creatinine 1.4, BNP 112 Labs (7/15): K 4.4, creatinine 1.2, HCT 40.2 Labs (8/15): K 4, creatinine 1.2, BNP 113, LFTs and TSH normal Labs (10/15): K 4.4, creatinine 1.4  PMH: 1. PAD: Peripheral arterial dopplers in 2012 showed > 75% bilateral SFA stenosis. Peripheral arterial dopplers in 10/14 showed > 50% focal bilateral SFA stenoses.  2. AAA: [place] 4/15 with 4.4 cm AAA but concern for penetrating ulcers. CTA abdomen showed 4.4 cm AAA with penetrating ulcers and possible pseudoaneurysms. Now s/p endovascular repair of AAA in 5/18 with no complications.  3. Carotid stenosis: Carotid dopplers (4/15) with 60-79% bilateral ICA stenosis, RICA may be near surgical range. Carotid dopplers (11/15) with 60-79% bilateral ICA stenosis.   4. CAD: CABG 1989 with LIMA-LAD, SVG-D, seq SVG-ramus and OM, sequential SVG-PDA/PLV. SVG-PDA/PLV found to be occluded on cath prior to AVR, so patient had SVG-PDA with AVR in 2007. LHC (7/15) with LIMA-LAD patent with 40-50% stenosis in LAD after touchdown, sequential SVG-ramus/OM/diagonal with only the ramus branch still intact (known from prior cath), SVG-PDA from original surgery TO, SVG-PDA from  redo surgery with long 50-60% mid-graft stenosis.  No target for intervention.  5. Severe aortic stenosis: Bioprosthetic AVR in 2007. Echo (11/10) with EF 60-65%, basal to mid inferior hypokinesis, bioprosthetic aortic valve without significant stenosis/regurgitation, mild MR. Echo (11/14) with EF 55-60%, mild LVH, bioprosthetic aortic valve looked ok. Bioprosthetic valve looked ok on 6/15 echo.   6. Atrial fibrillation: Paroxysmal. Initially noted after cardiac surgery,  then again after AAA repair.  DCCV to NSR 06/06/14.  DCCV to NSR again 08/21/14.  7. Type II diabetes  8. Gout  9. GERD  10. Hyperlipidemia  11. HTN 12. GERD  13. Chronic systolic CHF: Echo (2/09) with EF worsened to 25-30% with grade II diastolic dysfunction, stable bioprosthetic aortic valve, mildly dilated RV with normal systolic function.    SH: Widower, 2 children, raised his 3 grand-daughters, lives in [place], [place].  Occasional ETOH, no smoking.   FH: Father died from ruptured AAA.   ROS: All systems reviewed and negative except as per HPI.   Current Outpatient Prescriptions  Medication Sig Dispense Refill  . allopurinol (ZYLOPRIM) 100 MG tablet Take 100 mg by mouth daily.      Marland Kitchen atorvastatin (LIPITOR) 20 MG tablet Take 1 tablet (20 mg total) by mouth daily. 30 tablet 6  . beta carotene w/minerals (OCUVITE) tablet Take 1 tablet by mouth daily.    . Coenzyme Q10 (CO Q 10) 100 MG CAPS Take 100 mg by mouth daily.     Marland Kitchen doxazosin (CARDURA) 1 MG tablet Take 1-2 mg by mouth 2 (two) times daily. Take 2 tablets in the morning and take 1 tablet in the evening    . furosemide (LASIX) 40 MG tablet 40mg  alternating with 20mg  daily    . glucosamine-chondroitin 500-400 MG tablet Take 1 tablet by mouth daily.    Marland Kitchen JANUVIA 100 MG tablet Take 100 mg by mouth daily.     . metoprolol succinate (TOPROL XL) 50 MG 24 hr tablet Take 1 1/2 tablets by mouth daily 135 tablet 3  . Omega-3 Fatty Acids (FISH OIL) 1000 MG CAPS Take 1,000-2,000 mg by mouth 2 (two) times daily. Take 2000 mg in the morning and 1000 mg in the evening    . potassium chloride SA (K-DUR,KLOR-CON) 20 MEQ tablet Take 20 mEq by mouth daily.    . ramipril (ALTACE) 5 MG capsule Take 5 mg by mouth daily.    . ranitidine (ZANTAC) 150 MG tablet 1 tablet at bedtime    . spironolactone (ALDACTONE) 25 MG tablet Take 1 tablet (25 mg total) by mouth daily. 30 tablet 11  . warfarin (COUMADIN) 3 MG tablet Take 6 mg by mouth daily.    Marland Kitchen  ZETIA 10 MG tablet Take 10 mg by mouth daily.      No current facility-administered medications for this visit.    BP 132/82 mmHg  Pulse 66  Ht 5\' 10"  (1.778 m)  Wt 195 lb (88.451 kg)  BMI 27.98 kg/m2 General: NAD Neck: JVP 7 cm, no thyromegaly or thyroid nodule.  Lungs: Clear to auscultation bilaterally with normal respiratory effort. CV: Nondisplaced PMI.  Heart regular S1/S2, no S3/S4, 2/6 early SEM RUSB.  1+ ankle edema bilaterally.  Soft right carotid bruit.  1+ left PT, unable to feel right PT.   Abdomen: Soft, nontender, no hepatosplenomegaly, no distention. Ventral hernia.  Skin: Intact without lesions or rashes.  Neurologic: Alert and oriented x 3.  Psych: Normal affect. Extremities: No clubbing or cyanosis.   Assessment/Plan:  1. CAD: s/p CABG and redo CABG.  Given recent fall in EF, I did a cardiac cath in 7/15 as documented above. Unfortunately, despite significant coronary disease, there were no good interventional targets. - Continue statin, ramipril, Toprol XL.   - He is on warfarin in setting of stable CAD so not on ASA 81.  2. Hyperlipidemia: He should have aggressive control given extensive vascular disease.  Check lipids today. 3. PAD: Patient has moderate stable claudication symptoms.  No rest symptoms or foot ulcers. Peripheral arterial dopplers showed > 50% bilateral focal SFA stenoses.  He is not a cilostazol candidate with CHF.  We have talked about trying to walk through the pain for a short distance as this may help encourage collaterals. Symptoms have been stable and is now followed by [facility].  4. Carotid stenosis: Moderate bilateral stenosis.  He has had no stroke-like symptoms.  He has seen Dr. Trula Slade and has f/u at [facility].  5. AAA: Successful endovascular repair in 5/15.  6. Bioprosthetic AVR: Valve stable on 6/15 echo.  7. Atrial fibrillation: He has been cardioverted twice and is now holding NSR on amiodarone.  Given the bioprosthetic aortic valve and his CAD,  I ended up thinking that warfarin is a better choice than NOAC (he is now on warfarin).  He has symptomatic fatigue and dyspnea while in atrial fibrillation, so ideally we would keep him out.  He is now off amiodarone given possible CNS side effects.   - Recent TSH and LFTs normal.  He will need a yearly eye exam on amiodarone. - If he has atrial fibrillation recurrence, will need to consider ablation versus Tikosyn (once he has been out far enough from amiodarone use to have it out of his system).  He is very symptomatic in atrial fibrillation.  8. Chronic systolic CHF: Suspect ischemic cardiomyopathy.  EF now 25-30%, was normal on prior echo in 11/14.  As his atrial fibrillation has not been persistently fast, I cannot blame this on tachycardia-mediated cardiomyopathy.  LHC showed significant coronary disease but no interventional option. NYHA class II symptoms now, stable.  He does not look volume overloaded today.  - Continue Lasix 40 mg daily.  - Continue ramipril, spironolactone.   - Increase Toprol XL to 75 mg daily now that he is off amiodarone.  - I will get an echo to reassess EF.  - BMET/BNP today.    Followup in 2 months.   [name] 11/24/2014

## 2014-11-26 ENCOUNTER — Encounter: Payer: Self-pay | Admitting: Surgery

## 2014-11-26 ENCOUNTER — Ambulatory Visit (INDEPENDENT_AMBULATORY_CARE_PROVIDER_SITE_OTHER): Payer: Medicare Other | Admitting: Surgery

## 2014-11-26 ENCOUNTER — Ambulatory Visit (HOSPITAL_COMMUNITY)
Admission: RE | Admit: 2014-11-26 | Discharge: 2014-11-26 | Disposition: A | Discharge status: Home / Self Care | Payer: Medicare Other | Source: Ambulatory Visit | Attending: Surgery | Admitting: Surgery

## 2014-11-26 ENCOUNTER — Other Ambulatory Visit: Payer: Self-pay | Admitting: *Deleted

## 2014-11-26 VITALS — BP 140/58 | HR 67 | Ht 70.0 in | Wt 203.6 lb

## 2014-11-26 DIAGNOSIS — I208 Other forms of angina pectoris: Secondary | ICD-10-CM | POA: Diagnosis not present

## 2014-11-26 DIAGNOSIS — Z09 Encounter for follow-up examination after completed treatment for conditions other than malignant neoplasm: Secondary | ICD-10-CM | POA: Insufficient documentation

## 2014-11-26 DIAGNOSIS — Z95828 Presence of other vascular implants and grafts: Secondary | ICD-10-CM | POA: Diagnosis not present

## 2014-11-26 DIAGNOSIS — I714 Abdominal aortic aneurysm, without rupture, unspecified: Secondary | ICD-10-CM

## 2014-11-26 DIAGNOSIS — Z48812 Encounter for surgical aftercare following surgery on the circulatory system: Secondary | ICD-10-CM | POA: Diagnosis not present

## 2014-11-26 DIAGNOSIS — I719 Aortic aneurysm of unspecified site, without rupture: Secondary | ICD-10-CM | POA: Diagnosis not present

## 2014-11-26 DIAGNOSIS — E785 Hyperlipidemia, unspecified: Secondary | ICD-10-CM

## 2014-11-26 NOTE — Addendum Note (Signed)
Addended by: Mena Goes on: 11/26/2014 02:59 PM   Modules accepted: Orders

## 2014-11-26 NOTE — Progress Notes (Signed)
Patient name: Nathaniel [name] MRN: [id number] DOB: Feb 25, 1932 Sex: male     Chief Complaint  Patient presents with  . Re-evaluation    6 month f/u [facility]    HISTORY OF PRESENT ILLNESS: He is status post endovascular repair of abdominal aortic aneurysm on 04/13/2014. His postoperative course was complicated by atrial fibrillation. He was discharged on Coumadin.  His follow-up CT angiogram shows that the stent graft is in good position and there is no evidence of endoleak.  Since I last saw him he has had further evaluation of his heart.  He was last in normal sinus rhythm.  He had to stop his amiodarone secondary to hallucinations.  The patient does not endorse lifestyle limiting claudication symptoms.  He has no neurologic symptoms.  He denies abdominal pain.  Past Medical History  Diagnosis Date  . CAD (coronary artery disease)   . Atrial fibrillation   . Diabetes mellitus   . Hyperlipemia   . Hx: UTI (urinary tract infection)   . Gout   . Hypertension   . Cataract   . GERD (gastroesophageal reflux disease)   . Peripheral vascular disease   . Atrial fibrillation   . Shortness of breath   . Hernia, abdominal   . CHF (congestive heart failure)     Past Surgical History  Procedure Laterality Date  . Appendectomy    . Cardiac valve surgery    . Angioplasty      Unsure if he had stents put in   . Coronary artery bypass graft    . Tonsillectomy    . Eye surgery Bilateral     cataract surgery  . Hernia repair      2 operations  . Abdominal aortic endovascular stent graft N/A 04/13/2014    Procedure: ABDOMINAL AORTIC ENDOVASCULAR STENT GRAFT;  Surgeon: Serafina Mitchell, MD;  Location: [facility];  Service: Vascular;  Laterality: N/A;  . Cardioversion N/A 06/06/2014    Procedure: CARDIOVERSION;  Surgeon: Larey Dresser, MD;  Location: [facility] ENDOSCOPY;  Service: Cardiovascular;  Laterality: N/A;  . Cardioversion N/A 08/21/2014    Procedure: CARDIOVERSION;  Surgeon: Larey Dresser, MD;   Location: [facility];  Service: Cardiovascular;  Laterality: N/A;  . Left heart catheterization with coronary/graft angiogram N/A 07/10/2014    Procedure: LEFT HEART CATHETERIZATION WITH Beatrix Fetters;  Surgeon: Larey Dresser, MD;  Location: [facility] CATH LAB;  Service: Cardiovascular;  Laterality: N/A;    History   Social History  . Marital Status: Widowed    Spouse Name: N/A    Number of Children: N/A  . Years of Education: N/A   Occupational History  . [employer]     Social History Main Topics  . Smoking status: Former Smoker -- 20 years    Types: Cigarettes  . Smokeless tobacco: Never Used  . Alcohol Use: 6.0 oz/week    10 Glasses of wine per week     Comment: 1-2 drinks daily   . Drug Use: No  . Sexual Activity: Not on file   Other Topics Concern  . Not on file   Social History Narrative   1 caffeine drink daily     Family History  Problem Relation Age of Onset  . Colon cancer Neg Hx   . Heart disease Father     before age 69  . AAA (abdominal aortic aneurysm) Father   . Heart disease Mother     Allergies as of 11/26/2014  . (  No Known Allergies)    Current Outpatient Prescriptions on File Prior to Visit  Medication Sig Dispense Refill  . allopurinol (ZYLOPRIM) 100 MG tablet Take 100 mg by mouth daily.      Marland Kitchen atorvastatin (LIPITOR) 20 MG tablet Take 1 tablet (20 mg total) by mouth daily. 30 tablet 6  . beta carotene w/minerals (OCUVITE) tablet Take 1 tablet by mouth daily.    . Coenzyme Q10 (CO Q 10) 100 MG CAPS Take 100 mg by mouth daily.     Marland Kitchen doxazosin (CARDURA) 1 MG tablet Take 1-2 mg by mouth 2 (two) times daily. Take 2 tablets in the morning and take 1 tablet in the evening    . furosemide (LASIX) 40 MG tablet 40mg  alternating with 20mg  daily    . glucosamine-chondroitin 500-400 MG tablet Take 1 tablet by mouth daily.    . metoprolol succinate (TOPROL XL) 50 MG 24 hr tablet Take 1 1/2 tablets by mouth daily 135 tablet 3  . Omega-3 Fatty  Acids (FISH OIL) 1000 MG CAPS Take 1,000-2,000 mg by mouth 2 (two) times daily. Take 2000 mg in the morning and 1000 mg in the evening    . potassium chloride SA (K-DUR,KLOR-CON) 20 MEQ tablet Take 20 mEq by mouth daily.    . ramipril (ALTACE) 5 MG capsule Take 5 mg by mouth daily.    Marland Kitchen spironolactone (ALDACTONE) 25 MG tablet Take 1 tablet (25 mg total) by mouth daily. 30 tablet 11  . warfarin (COUMADIN) 3 MG tablet Take 6 mg by mouth daily.    Marland Kitchen ZETIA 10 MG tablet Take 10 mg by mouth daily.     Marland Kitchen JANUVIA 100 MG tablet Take 100 mg by mouth daily.     . ranitidine (ZANTAC) 150 MG tablet 1 tablet at bedtime     No current facility-administered medications on file prior to visit.     REVIEW OF SYSTEMS: Please see history of present illness, otherwise negative  PHYSICAL EXAMINATION:   Vital signs are BP 140/58 mmHg  Pulse 67  Ht 5\' 10"  (1.778 m)  Wt 203 lb 9.6 oz (92.352 kg)  BMI 29.21 kg/m2  SpO2 97% General: The patient appears their stated age. HEENT:  No gross abnormalities Pulmonary:  Non labored breathing Abdomen: Soft and non-tender Musculoskeletal: There are no major deformities. Neurologic: No focal weakness or paresthesias are detected, Skin: There are no ulcer or rashes noted. Psychiatric: The patient has normal affect. Cardiovascular: There is a regular rate and rhythm without significant murmur appreciated.  Palpable femoral pulses   Diagnostic Studies Ultrasound was ordered and reviewed today.  This shows an aneurysm sac measuring 3.89 cm.  There was no evidence of endoleak.  Assessment: Status post endovascular aneurysm repair Plan: Patient is doing very well from my perspective.  His aneurysm sac remained stable.  There is no evidence of endoleak.  Claudication: The patient has documented peripheral vascular disease with lower extremity atherosclerosis.  His symptoms are not lifestyle limiting at this time.  He is not a candidate for cilostazol given his history  of heart failure.  We'll continue to monitor this  Carotid artery stenosis: The patient is asymptomatic.  His most recent carotid Doppler study was in November 2015.  This shows 60-79% stenosis bilaterally.  This is being followed by Dr. Aundra Dubin.  His neck studies in 6 months   V. Leia Alf, M.D. [facility] Office: [phone] Pager:  [phone]

## 2014-11-28 NOTE — Telephone Encounter (Signed)
Pt advised, did not want to schedule appt at this time, he will let me know if he decides he wants to see Dr Donne Hazel.

## 2014-11-28 NOTE — Telephone Encounter (Signed)
I spoke with patient and he is ready to schedule echo here, message has been sent to [facility] to contact pt to schedule echo.   Pt mentioned that he has had a hernia between his belt buckle and sternum, he first noticed this about 2 years ago. Recently when exercising and stretching he has noticed it has gotten bigger and he feels his waist is bigger because of this.  Pt asking what physician Dr Aundra Dubin would recommend he see about this.  I will forward to Dr Aundra Dubin for review.

## 2014-11-28 NOTE — Telephone Encounter (Signed)
Nathaniel [name] at [facility] can evaluate his hernia.

## 2014-12-18 ENCOUNTER — Ambulatory Visit (INDEPENDENT_AMBULATORY_CARE_PROVIDER_SITE_OTHER): Payer: Medicare Other

## 2014-12-18 ENCOUNTER — Telehealth: Payer: Self-pay | Admitting: Cardiology

## 2014-12-18 DIAGNOSIS — I4891 Unspecified atrial fibrillation: Secondary | ICD-10-CM

## 2014-12-18 LAB — POCT INR: INR: 1.3

## 2014-12-18 NOTE — Telephone Encounter (Signed)
New Message        Pt calling stating he arrived at our office this morning for a coumadin appt and an echo and was told that he had an appt today for coumadin but not for the echo. He was told that his echo was on a different day. Pt states when he got home he looked and the printed out paper from our office from his last visit that has both his coumadin appt and his echo listed for today. Pt wants to speak with someone in regards to this. Please call back and advise.

## 2014-12-18 NOTE — Telephone Encounter (Signed)
Returned patient's call. Patient stated "I was scheduled for my echo today and they told me I didn't have an appointment today. I have a sheet here that tells me other wise." Looked back through patient's chart and could not find evidence of an echo scheduled for 12/18/14. Apologized  to patient for this misunderstanding. Patient is scheduled for an echo on 12/25/14. Confirmed appointment for future echo. Patient stated, "I am going to bring this sheet in and show you. I know it's too late now, just to let you know I am not just making this up." Assured patient that I do not doubt what he is telling me. Apologized to patient again for the misunderstanding. Patient will be here for his appointment for echo and coumadin clinic on 12/25/14. Patient did not sound satisfied, and an apology with the definite date for his echo was all I could offer the patient.  Patient wanted to know if he would be out of the office the day of his echo on 12/25/14 by 11:30. Informed patient that no promise could be made that he would be out of the office by 11:30. Informed patient that the echo can take up to an hour, and if all goes to schedule I would hope he would be out by 11:30. Patient stated he was going to arrive early and get his coumadin checked, that he was told by the coumadin clinic this was okay. Informed patient I have seen them take patients early, but again I could not guarantee how their scheduled would be. Assured patient that if he did arrive early there would be a good chance of him getting his coumadin checked early. Patient was calm and verbalized understanding.

## 2014-12-24 ENCOUNTER — Other Ambulatory Visit: Payer: Self-pay | Admitting: Cardiology

## 2014-12-25 ENCOUNTER — Ambulatory Visit (HOSPITAL_COMMUNITY): Payer: Medicare Other | Attending: Cardiology | Admitting: Cardiology

## 2014-12-25 ENCOUNTER — Ambulatory Visit (INDEPENDENT_AMBULATORY_CARE_PROVIDER_SITE_OTHER): Payer: Medicare Other | Admitting: *Deleted

## 2014-12-25 ENCOUNTER — Other Ambulatory Visit: Payer: Self-pay | Admitting: *Deleted

## 2014-12-25 DIAGNOSIS — I10 Essential (primary) hypertension: Secondary | ICD-10-CM | POA: Insufficient documentation

## 2014-12-25 DIAGNOSIS — E119 Type 2 diabetes mellitus without complications: Secondary | ICD-10-CM | POA: Diagnosis not present

## 2014-12-25 DIAGNOSIS — E785 Hyperlipidemia, unspecified: Secondary | ICD-10-CM | POA: Diagnosis not present

## 2014-12-25 DIAGNOSIS — I4891 Unspecified atrial fibrillation: Secondary | ICD-10-CM | POA: Diagnosis not present

## 2014-12-25 DIAGNOSIS — I480 Paroxysmal atrial fibrillation: Secondary | ICD-10-CM

## 2014-12-25 LAB — POCT INR: INR: 1.6

## 2014-12-25 NOTE — Progress Notes (Signed)
Echo performed. 

## 2014-12-27 ENCOUNTER — Encounter (HOSPITAL_COMMUNITY): Payer: Self-pay | Admitting: Internal Medicine

## 2014-12-27 ENCOUNTER — Telehealth: Payer: Self-pay | Admitting: *Deleted

## 2014-12-27 DIAGNOSIS — K469 Unspecified abdominal hernia without obstruction or gangrene: Secondary | ICD-10-CM

## 2014-12-27 NOTE — Telephone Encounter (Signed)
Larey Dresser, MD at 11/28/2014 2:04 PM     Status: Signed        Serita Grammes at [facility] can evaluate his hernia.

## 2015-01-08 ENCOUNTER — Ambulatory Visit (INDEPENDENT_AMBULATORY_CARE_PROVIDER_SITE_OTHER): Payer: Medicare Other | Admitting: *Deleted

## 2015-01-08 DIAGNOSIS — M6208 Separation of muscle (nontraumatic), other site: Secondary | ICD-10-CM | POA: Diagnosis not present

## 2015-01-08 DIAGNOSIS — I4891 Unspecified atrial fibrillation: Secondary | ICD-10-CM | POA: Diagnosis not present

## 2015-01-08 LAB — POCT INR: INR: 2

## 2015-01-17 DIAGNOSIS — E782 Mixed hyperlipidemia: Secondary | ICD-10-CM | POA: Diagnosis not present

## 2015-01-17 DIAGNOSIS — E1159 Type 2 diabetes mellitus with other circulatory complications: Secondary | ICD-10-CM | POA: Diagnosis not present

## 2015-01-22 DIAGNOSIS — I70203 Unspecified atherosclerosis of native arteries of extremities, bilateral legs: Secondary | ICD-10-CM | POA: Diagnosis not present

## 2015-01-22 DIAGNOSIS — E1159 Type 2 diabetes mellitus with other circulatory complications: Secondary | ICD-10-CM | POA: Diagnosis not present

## 2015-01-22 DIAGNOSIS — M109 Gout, unspecified: Secondary | ICD-10-CM | POA: Diagnosis not present

## 2015-01-22 DIAGNOSIS — I10 Essential (primary) hypertension: Secondary | ICD-10-CM | POA: Diagnosis not present

## 2015-01-22 DIAGNOSIS — N183 Chronic kidney disease, stage 3 (moderate): Secondary | ICD-10-CM | POA: Diagnosis not present

## 2015-01-22 DIAGNOSIS — E1165 Type 2 diabetes mellitus with hyperglycemia: Secondary | ICD-10-CM | POA: Diagnosis not present

## 2015-01-22 DIAGNOSIS — I2510 Atherosclerotic heart disease of native coronary artery without angina pectoris: Secondary | ICD-10-CM | POA: Diagnosis not present

## 2015-01-22 DIAGNOSIS — E782 Mixed hyperlipidemia: Secondary | ICD-10-CM | POA: Diagnosis not present

## 2015-01-29 ENCOUNTER — Ambulatory Visit (INDEPENDENT_AMBULATORY_CARE_PROVIDER_SITE_OTHER): Payer: Medicare Other | Admitting: *Deleted

## 2015-01-29 DIAGNOSIS — I4891 Unspecified atrial fibrillation: Secondary | ICD-10-CM | POA: Diagnosis not present

## 2015-01-29 LAB — POCT INR: INR: 2.6

## 2015-02-26 ENCOUNTER — Ambulatory Visit (INDEPENDENT_AMBULATORY_CARE_PROVIDER_SITE_OTHER): Payer: Medicare Other | Admitting: *Deleted

## 2015-02-26 ENCOUNTER — Other Ambulatory Visit (INDEPENDENT_AMBULATORY_CARE_PROVIDER_SITE_OTHER): Payer: Medicare Other | Admitting: *Deleted

## 2015-02-26 DIAGNOSIS — I4891 Unspecified atrial fibrillation: Secondary | ICD-10-CM

## 2015-02-26 DIAGNOSIS — E785 Hyperlipidemia, unspecified: Secondary | ICD-10-CM

## 2015-02-26 LAB — HEPATIC FUNCTION PANEL
ALBUMIN: 4.1 g/dL (ref 3.5–5.2)
ALK PHOS: 46 U/L (ref 39–117)
ALT: 15 U/L (ref 0–53)
AST: 21 U/L (ref 0–37)
Bilirubin, Direct: 0.2 mg/dL (ref 0.0–0.3)
TOTAL PROTEIN: 6.5 g/dL (ref 6.0–8.3)
Total Bilirubin: 0.5 mg/dL (ref 0.2–1.2)

## 2015-02-26 LAB — LIPID PANEL
CHOL/HDL RATIO: 4
Cholesterol: 155 mg/dL (ref 0–200)
HDL: 43.3 mg/dL (ref >39.00)
LDL Cholesterol: 89 mg/dL (ref 0–99)
NonHDL: 111.7
TRIGLYCERIDES: 116 mg/dL (ref 0.0–149.0)
VLDL: 23.2 mg/dL (ref 0.0–40.0)

## 2015-02-26 LAB — POCT INR: INR: 2.8

## 2015-03-28 ENCOUNTER — Ambulatory Visit (INDEPENDENT_AMBULATORY_CARE_PROVIDER_SITE_OTHER): Payer: Medicare Other | Admitting: *Deleted

## 2015-03-28 DIAGNOSIS — I4891 Unspecified atrial fibrillation: Secondary | ICD-10-CM | POA: Diagnosis not present

## 2015-03-28 DIAGNOSIS — I480 Paroxysmal atrial fibrillation: Secondary | ICD-10-CM | POA: Diagnosis not present

## 2015-03-28 DIAGNOSIS — Z5181 Encounter for therapeutic drug level monitoring: Secondary | ICD-10-CM | POA: Insufficient documentation

## 2015-03-28 LAB — POCT INR: INR: 1.9

## 2015-04-05 ENCOUNTER — Telehealth: Payer: Self-pay | Admitting: *Deleted

## 2015-04-05 ENCOUNTER — Other Ambulatory Visit: Payer: Self-pay | Admitting: Cardiology

## 2015-04-05 MED ORDER — METOPROLOL SUCCINATE ER 25 MG PO TB24
ORAL_TABLET | ORAL | Status: DC
Start: 2015-04-05 — End: 2015-05-14

## 2015-04-05 NOTE — Telephone Encounter (Signed)
See note on rx request about patient wanting to go back to '25mg'$  bid. Please advise. Thanks, MI

## 2015-04-05 NOTE — Telephone Encounter (Signed)
How about have him take 37.5 mg Toprol XL daily?

## 2015-04-05 NOTE — Telephone Encounter (Signed)
Pt was prescribed  metoprolol succinate '75mg'$  daily.  Pt states he is unable to tolerate this dose and is currently alternating metoprolol succinate '25mg'$  with '50mg'$  every other day.   Pt states he is doing well on this metoprolol succinate regimen.   Pt asking for a refill of metoprolol with these directions.  Pt advised I will forward to Dr Aundra Dubin for review.

## 2015-04-05 NOTE — Telephone Encounter (Signed)
Pt states he will take Toprol XL 37.'5mg'$  daily.

## 2015-04-08 NOTE — Telephone Encounter (Signed)
Spoke with patient and he stated that he would like to change to '25mg'$  bid, but then after discussing the phone call with Webb Silversmith on 04/05/15 he recalled that he was supposed to take 37.'5mg'$  qd. He will continue with this dose. He is aware that he has refills at the pharmacy.

## 2015-04-08 NOTE — Telephone Encounter (Signed)
See phone note 04/05/15

## 2015-04-23 DIAGNOSIS — E1159 Type 2 diabetes mellitus with other circulatory complications: Secondary | ICD-10-CM | POA: Diagnosis not present

## 2015-04-23 DIAGNOSIS — E782 Mixed hyperlipidemia: Secondary | ICD-10-CM | POA: Diagnosis not present

## 2015-04-25 DIAGNOSIS — I70203 Unspecified atherosclerosis of native arteries of extremities, bilateral legs: Secondary | ICD-10-CM | POA: Diagnosis not present

## 2015-04-25 DIAGNOSIS — I10 Essential (primary) hypertension: Secondary | ICD-10-CM | POA: Diagnosis not present

## 2015-04-25 DIAGNOSIS — M109 Gout, unspecified: Secondary | ICD-10-CM | POA: Diagnosis not present

## 2015-04-25 DIAGNOSIS — N183 Chronic kidney disease, stage 3 (moderate): Secondary | ICD-10-CM | POA: Diagnosis not present

## 2015-04-25 DIAGNOSIS — E1159 Type 2 diabetes mellitus with other circulatory complications: Secondary | ICD-10-CM | POA: Diagnosis not present

## 2015-04-25 DIAGNOSIS — E1165 Type 2 diabetes mellitus with hyperglycemia: Secondary | ICD-10-CM | POA: Diagnosis not present

## 2015-04-25 DIAGNOSIS — E782 Mixed hyperlipidemia: Secondary | ICD-10-CM | POA: Diagnosis not present

## 2015-04-25 DIAGNOSIS — I2510 Atherosclerotic heart disease of native coronary artery without angina pectoris: Secondary | ICD-10-CM | POA: Diagnosis not present

## 2015-04-26 ENCOUNTER — Encounter: Payer: Self-pay | Admitting: Cardiology

## 2015-05-02 ENCOUNTER — Ambulatory Visit (INDEPENDENT_AMBULATORY_CARE_PROVIDER_SITE_OTHER): Payer: Medicare Other

## 2015-05-02 DIAGNOSIS — I4891 Unspecified atrial fibrillation: Secondary | ICD-10-CM

## 2015-05-02 DIAGNOSIS — Z5181 Encounter for therapeutic drug level monitoring: Secondary | ICD-10-CM

## 2015-05-02 LAB — POCT INR: INR: 2.5

## 2015-05-14 ENCOUNTER — Ambulatory Visit (INDEPENDENT_AMBULATORY_CARE_PROVIDER_SITE_OTHER): Payer: Medicare Other | Admitting: Cardiology

## 2015-05-14 ENCOUNTER — Encounter: Payer: Self-pay | Admitting: *Deleted

## 2015-05-14 ENCOUNTER — Encounter: Payer: Self-pay | Admitting: Cardiology

## 2015-05-14 VITALS — BP 136/68 | HR 75 | Ht 70.0 in | Wt 201.0 lb

## 2015-05-14 DIAGNOSIS — I480 Paroxysmal atrial fibrillation: Secondary | ICD-10-CM | POA: Diagnosis not present

## 2015-05-14 DIAGNOSIS — I739 Peripheral vascular disease, unspecified: Secondary | ICD-10-CM | POA: Diagnosis not present

## 2015-05-14 DIAGNOSIS — I714 Abdominal aortic aneurysm, without rupture, unspecified: Secondary | ICD-10-CM

## 2015-05-14 DIAGNOSIS — E785 Hyperlipidemia, unspecified: Secondary | ICD-10-CM

## 2015-05-14 DIAGNOSIS — I779 Disorder of arteries and arterioles, unspecified: Secondary | ICD-10-CM | POA: Diagnosis not present

## 2015-05-14 DIAGNOSIS — Z953 Presence of xenogenic heart valve: Secondary | ICD-10-CM

## 2015-05-14 DIAGNOSIS — I5022 Chronic systolic (congestive) heart failure: Secondary | ICD-10-CM

## 2015-05-14 DIAGNOSIS — Z954 Presence of other heart-valve replacement: Secondary | ICD-10-CM

## 2015-05-14 DIAGNOSIS — I5032 Chronic diastolic (congestive) heart failure: Secondary | ICD-10-CM

## 2015-05-14 MED ORDER — ATORVASTATIN CALCIUM 10 MG PO TABS: 10.0000 mg | ORAL_TABLET | Freq: Every day | ORAL | Status: DC

## 2015-05-14 MED ORDER — METOPROLOL SUCCINATE ER 25 MG PO TB24: 25.0000 mg | ORAL_TABLET | Freq: Every day | ORAL | Status: DC

## 2015-05-14 MED ORDER — ATORVASTATIN CALCIUM 20 MG PO TABS: 20.0000 mg | ORAL_TABLET | Freq: Every day | ORAL | Status: DC

## 2015-05-14 MED ORDER — FUROSEMIDE 40 MG PO TABS: ORAL_TABLET | ORAL | Status: DC

## 2015-05-14 NOTE — Patient Instructions (Signed)
Medication Instructions:  Increase atorvastatin to '30mg'$  daily, this will be 1 of a '10mg'$  tablet and 1 of a '20mg'$  tablet daily.  Continue to alternate lasix '40mg'$  with '20mg'$  daily, take this in the morning.  Labwork: Your physician recommends that you return for a FASTING lipid profile/liver profile in 2 months.   Testing/Procedures: Your physician has requested that you have a carotid duplex. This test is an ultrasound of the carotid arteries in your neck. It looks at blood flow through these arteries that supply the brain with blood. Allow one hour for this exam. There are no restrictions or special instructions.     Follow-Up: Dr Aundra Dubin in 4 months.

## 2015-05-15 NOTE — Progress Notes (Signed)
Patient ID: Nathaniel [name], male   DOB: [birth year], 81 y.o.   MRN: [id number] PCP: Dr. Elyse Hsu  81 yo with history of extensive vascular disease including CAD s/p CABG and redo CABG, AAA s/p repair, PAD, and carotid stenosis as well as prior AVR presents for cardiology followup.   He has had documentation of significant peripheral arterial disease.  He gets bilateral calf soreness after walking for about 15 minutes.  This resolves with rest, no rest pain.  No pedal ulcers, though when he gets cuts on his feet, they heal slowly.  Peripheral arterial dopplers done in 10/14 showed > 50% focal SFA stenoses bilaterally.  He has at least moderate right common carotid stenosis that may be nearing surgical range.  No stroke-like symptoms.  He had a recent endovascular AAA repair in 5/15.    After his AAA repair, he was noted to be in atrial fibrillation.  He had had a prior episode of atrial fibrillation after his cardiac surgery in 2007.  Atrial fibrillation was rate-controlled.  He was started on warfarin and Toprol XL.  He developed exertional dyspnea and fatigue in atrial fibrillation. I cardioverted him back to NSR on 06/06/14.  I also had him get an echocardiogram in 6/15.  This showed EF worsened to 25-30% with stable bioprosthetic aortic valve, mildly dilated RV with normal systolic function.  Given the fall in EF, I took him for [facility] in 7/15.  This showed stable anatomy.  The LIMA-LAD was patent, the sequential SVG-OM/ramus/D was patent only to ramus but this is the same as the prior cath, and the SVG-PDA was patent with a long 50-60% mid-graft stenosis.  No intervention.  Of note, at cath he was noted to be back in atrial fibrillation.  I then started him on amiodarone.  I took Mr Nathaniel [name] for [place] again on 08/21/14.  This was successful and he remains in NSR today.  He has been off amiodarone due to side effects (hallucinations).  Echo in 1/16 showed recovery of EF to 55-60%.    Mr Nathaniel [name] has been stable since last  appointment.  No chest pain.  He has been walking more.  Dyspnea walking up a hill, ok on flat ground.  He still has generalized fatigue that he attributes in part to Toprol XL.  He tires after 9-12 holes of golf.  He is getting up a lot at night to urinate.  He has been taking his Lasix in the evening.     ECG: NSR, PACs, right axis deviation, inferior and anterolateral TWIs  Labs (5/15): K 4.3, creatinine 0.89 Labs (6/15): K 4.3, creatinine 1.4, BNP 112 Labs (7/15): K 4.4, creatinine 1.2, HCT 40.2 Labs (8/15): K 4, creatinine 1.2, BNP 113, LFTs and TSH normal Labs (10/15): K 4.4, creatinine 1.4 Labs (5/16): K 4.7, creatinine 1.18, HDL 52, LDL 98  PMH: 1. PAD: Peripheral arterial dopplers in 2012 showed > 75% bilateral SFA stenosis. Peripheral arterial dopplers in 10/14 showed > 50% focal bilateral SFA stenoses.  2. AAA: [place] 4/15 with 4.4 cm AAA but concern for penetrating ulcers. CTA abdomen showed 4.4 cm AAA with penetrating ulcers and possible pseudoaneurysms. Now s/p endovascular repair of AAA in 5/05 with no complications.  3. Carotid stenosis: Carotid dopplers (4/15) with 60-79% bilateral ICA stenosis, RICA may be near surgical range. Carotid dopplers (11/15) with 60-79% bilateral ICA stenosis.   4. CAD: CABG 1989 with LIMA-LAD, SVG-D, seq SVG-ramus and OM, sequential SVG-PDA/PLV. SVG-PDA/PLV found to be occluded on  cath prior to AVR, so patient had SVG-PDA with AVR in 2007. LHC (7/15) with LIMA-LAD patent with 40-50% stenosis in LAD after touchdown, sequential SVG-ramus/OM/diagonal with only the ramus branch still intact (known from prior cath), SVG-PDA from original surgery TO, SVG-PDA from redo surgery with long 50-60% mid-graft stenosis.  No target for intervention.  5. Severe aortic stenosis: Bioprosthetic AVR in 2007. Echo (11/10) with EF 60-65%, basal to mid inferior hypokinesis, bioprosthetic aortic valve without significant stenosis/regurgitation, mild MR. Echo (11/14) with EF 55-60%,  mild LVH, bioprosthetic aortic valve looked ok. Bioprosthetic valve looked ok on 6/15 echo.   6. Atrial fibrillation: Paroxysmal. Initially noted after cardiac surgery, then again after AAA repair.  DCCV to NSR 06/06/14.  DCCV to NSR again 08/21/14.  7. Type II diabetes  8. Gout  9. GERD  10. Hyperlipidemia  11. HTN 12. GERD  13. Chronic systolic CHF: Echo (4/03) with EF worsened to 25-30% with grade II diastolic dysfunction, stable bioprosthetic aortic valve, mildly dilated RV with normal systolic function.  Echo (1/16) with EF 55-60%, basal inferior hypokinesis, mild LVH, bioprosthetic aortic valve with mean gradient 13 mmHg, mild MR, severe LAE.   SH: Widower, 2 children, raised his 3 grand-daughters, lives in [place], [place].  Occasional ETOH, no smoking.   FH: Father died from ruptured AAA.   ROS: All systems reviewed and negative except as per HPI.   Current Outpatient Prescriptions  Medication Sig Dispense Refill  . allopurinol (ZYLOPRIM) 100 MG tablet Take 100 mg by mouth daily.      Marland Kitchen atorvastatin (LIPITOR) 20 MG tablet Take 1 tablet (20 mg total) by mouth daily. 30 tablet 6  . beta carotene w/minerals (OCUVITE) tablet Take 1 tablet by mouth daily.    . Coenzyme Q10 (CO Q 10) 100 MG CAPS Take 100 mg by mouth daily.     Marland Kitchen doxazosin (CARDURA) 1 MG tablet Take 1-2 mg by mouth 2 (two) times daily. Take 2 tablets in the morning and take 1 tablet in the evening    . furosemide (LASIX) 40 MG tablet '40mg'$  alternating with '20mg'$  daily in the morning 30 tablet 6  . glucosamine-chondroitin 500-400 MG tablet Take 1 tablet by mouth daily.    . Omega-3 Fatty Acids (FISH OIL) 1000 MG CAPS Take 1,000-2,000 mg by mouth 2 (two) times daily. Take 2000 mg in the morning and 1000 mg in the evening    . ramipril (ALTACE) 5 MG capsule Take 5 mg by mouth daily.    . ranitidine (ZANTAC) 150 MG tablet 1 tablet at bedtime    . spironolactone (ALDACTONE) 25 MG tablet Take 1 tablet (25 mg total) by mouth  daily. 30 tablet 11  . warfarin (COUMADIN) 3 MG tablet Take as directed by coumadin clinic 70 tablet 3  . ZETIA 10 MG tablet Take 10 mg by mouth daily.     Marland Kitchen atorvastatin (LIPITOR) 10 MG tablet Take 1 tablet (10 mg total) by mouth daily. 30 tablet 6  . metoprolol succinate (TOPROL XL) 25 MG 24 hr tablet Take 1 tablet (25 mg total) by mouth daily. 30 tablet 6   No current facility-administered medications for this visit.    BP 136/68 mmHg  Pulse 75  Ht '5\' 10"'$  (1.778 m)  Wt 201 lb (91.173 kg)  BMI 28.84 kg/m2 General: NAD Neck: JVP 7-8 cm, no thyromegaly or thyroid nodule.  Lungs: Clear to auscultation bilaterally with normal respiratory effort. CV: Nondisplaced PMI.  Heart regular S1/S2, no S3/S4,  1/6 early SEM RUSB.  Trace ankle edema bilaterally.  Soft right carotid bruit.  1+ left PT, unable to feel right PT.   Abdomen: Soft, nontender, no hepatosplenomegaly, no distention. Ventral hernia.  Skin: Intact without lesions or rashes.  Neurologic: Alert and oriented x 3.  Psych: Normal affect. Extremities: No clubbing or cyanosis.   Assessment/Plan:  1. CAD: s/p CABG and redo CABG.  Given fall in EF, I did a cardiac cath in 7/15 as documented above. Unfortunately, despite significant coronary disease, there were no good interventional targets. - Continue statin, ramipril, Toprol XL.   - He is on warfarin in setting of stable CAD so not on ASA 81.  2. Hyperlipidemia: He should have aggressive control given extensive vascular disease.  LDL was 98 when checked recently, would like < 70.  I suggested increasing atorvastatin to 40 mg daily.  We compromised on increasing it to 30 mg daily.  He will need lipids/LFTs in 2 months.  3. PAD: Patient has moderate stable claudication symptoms.  No rest symptoms or foot ulcers. Peripheral arterial dopplers showed > 50% bilateral focal SFA stenoses in the past.  He is not a cilostazol candidate with CHF.  We have talked about trying to walk through the  pain for a short distance as this may help encourage collaterals. Symptoms have been stable and is now followed by [facility].  4. Carotid stenosis: Moderate bilateral stenosis.  He has had no stroke-like symptoms.  I will get repeat carotid dopplers. 5. AAA: Successful endovascular repair in 5/15.  6. Bioprosthetic AVR: Valve stable on 1/16 echo.  7. Atrial fibrillation: He has been cardioverted twice and is now holding NSR.  Given the bioprosthetic aortic valve and his CAD, I ended up thinking that warfarin is a better choice than NOAC (he is now on warfarin).  He has symptomatic fatigue and dyspnea while in atrial fibrillation, so ideally we would keep him out.  He is now off amiodarone given possible CNS side effects.   - If he has atrial fibrillation recurrence, will need to consider ablation versus Tikosyn.  He is very symptomatic in atrial fibrillation.  8. Chronic diastolic CHF: Suspect ischemic cardiomyopathy.  EF 25-30% in 6/15 but had normalized to 55-60% by 1/16.  NYHA class II symptoms now, stable.  He does not look significantly volume overloaded today.  - Continue Lasix 40 mg daily alternating with 20 mg daily.   He needs to start taking it in the morning so he is not up all night urinating (has been taking it in the evening).  - Continue ramipril, spironolactone.   - Given fatigue, I will decrease Toprol XL to 25 mg daily.   Followup in 4 months.   [name] 05/15/2015

## 2015-05-28 ENCOUNTER — Other Ambulatory Visit (INDEPENDENT_AMBULATORY_CARE_PROVIDER_SITE_OTHER): Payer: Medicare Other | Admitting: *Deleted

## 2015-05-28 ENCOUNTER — Ambulatory Visit (HOSPITAL_COMMUNITY): Payer: Medicare Other | Attending: Cardiology

## 2015-05-28 DIAGNOSIS — I5022 Chronic systolic (congestive) heart failure: Secondary | ICD-10-CM

## 2015-05-28 DIAGNOSIS — I779 Disorder of arteries and arterioles, unspecified: Secondary | ICD-10-CM

## 2015-05-28 DIAGNOSIS — I480 Paroxysmal atrial fibrillation: Secondary | ICD-10-CM

## 2015-05-28 DIAGNOSIS — I6523 Occlusion and stenosis of bilateral carotid arteries: Secondary | ICD-10-CM | POA: Insufficient documentation

## 2015-05-28 DIAGNOSIS — I739 Peripheral vascular disease, unspecified: Secondary | ICD-10-CM

## 2015-05-28 LAB — LIPID PANEL
CHOL/HDL RATIO: 4
Cholesterol: 201 mg/dL — ABNORMAL HIGH (ref 0–200)
HDL: 46.2 mg/dL (ref >39.00)
LDL Cholesterol: 118 mg/dL — ABNORMAL HIGH (ref 0–99)
NONHDL: 154.8
Triglycerides: 186 mg/dL — ABNORMAL HIGH (ref 0.0–149.0)
VLDL: 37.2 mg/dL (ref 0.0–40.0)

## 2015-05-28 LAB — HEPATIC FUNCTION PANEL
ALK PHOS: 46 U/L (ref 39–117)
ALT: 14 U/L (ref 0–53)
AST: 19 U/L (ref 0–37)
Albumin: 4.1 g/dL (ref 3.5–5.2)
Bilirubin, Direct: 0.1 mg/dL (ref 0.0–0.3)
Total Bilirubin: 0.7 mg/dL (ref 0.2–1.2)
Total Protein: 6.7 g/dL (ref 6.0–8.3)

## 2015-05-28 NOTE — Addendum Note (Signed)
Addended by: Eulis Foster on: 05/28/2015 08:06 AM   Modules accepted: Orders

## 2015-06-05 ENCOUNTER — Other Ambulatory Visit: Payer: Self-pay | Admitting: Cardiology

## 2015-06-13 ENCOUNTER — Ambulatory Visit (INDEPENDENT_AMBULATORY_CARE_PROVIDER_SITE_OTHER): Payer: Medicare Other | Admitting: *Deleted

## 2015-06-13 DIAGNOSIS — I4891 Unspecified atrial fibrillation: Secondary | ICD-10-CM | POA: Diagnosis not present

## 2015-06-13 DIAGNOSIS — Z5181 Encounter for therapeutic drug level monitoring: Secondary | ICD-10-CM | POA: Diagnosis not present

## 2015-06-13 LAB — POCT INR: INR: 2.4

## 2015-07-15 ENCOUNTER — Encounter: Payer: Self-pay | Admitting: Internal Medicine

## 2015-07-24 DIAGNOSIS — E782 Mixed hyperlipidemia: Secondary | ICD-10-CM | POA: Diagnosis not present

## 2015-07-24 DIAGNOSIS — E1159 Type 2 diabetes mellitus with other circulatory complications: Secondary | ICD-10-CM | POA: Diagnosis not present

## 2015-07-25 ENCOUNTER — Ambulatory Visit (INDEPENDENT_AMBULATORY_CARE_PROVIDER_SITE_OTHER): Payer: Medicare Other | Admitting: *Deleted

## 2015-07-25 DIAGNOSIS — Z5181 Encounter for therapeutic drug level monitoring: Secondary | ICD-10-CM | POA: Diagnosis not present

## 2015-07-25 DIAGNOSIS — I4891 Unspecified atrial fibrillation: Secondary | ICD-10-CM

## 2015-07-25 LAB — POCT INR: INR: 2.3

## 2015-07-26 DIAGNOSIS — I70203 Unspecified atherosclerosis of native arteries of extremities, bilateral legs: Secondary | ICD-10-CM | POA: Diagnosis not present

## 2015-07-26 DIAGNOSIS — M109 Gout, unspecified: Secondary | ICD-10-CM | POA: Diagnosis not present

## 2015-07-26 DIAGNOSIS — E1159 Type 2 diabetes mellitus with other circulatory complications: Secondary | ICD-10-CM | POA: Diagnosis not present

## 2015-07-26 DIAGNOSIS — I2510 Atherosclerotic heart disease of native coronary artery without angina pectoris: Secondary | ICD-10-CM | POA: Diagnosis not present

## 2015-07-26 DIAGNOSIS — N183 Chronic kidney disease, stage 3 (moderate): Secondary | ICD-10-CM | POA: Diagnosis not present

## 2015-07-26 DIAGNOSIS — E782 Mixed hyperlipidemia: Secondary | ICD-10-CM | POA: Diagnosis not present

## 2015-07-26 DIAGNOSIS — E1165 Type 2 diabetes mellitus with hyperglycemia: Secondary | ICD-10-CM | POA: Diagnosis not present

## 2015-07-26 DIAGNOSIS — I10 Essential (primary) hypertension: Secondary | ICD-10-CM | POA: Diagnosis not present

## 2015-08-06 DIAGNOSIS — H35372 Puckering of macula, left eye: Secondary | ICD-10-CM | POA: Diagnosis not present

## 2015-08-06 DIAGNOSIS — H40013 Open angle with borderline findings, low risk, bilateral: Secondary | ICD-10-CM | POA: Diagnosis not present

## 2015-08-06 DIAGNOSIS — E119 Type 2 diabetes mellitus without complications: Secondary | ICD-10-CM | POA: Diagnosis not present

## 2015-08-10 IMAGING — CT CT CTA ABD/PEL W/CM AND/OR W/O CM
2 of 6 series · 14 of 46 positions shown, 16 images · IV contrast (Omnipaque 300)
Comparison: MRA report 01/10/2003.  Images not available

CLINICAL DATA: Evaluate abdominal aortic aneurysm.

EXAM:
CT ANGIOGRAPHY ABDOMEN AND PELVIS
TECHNIQUE: Multidetector CT imaging of the abdomen and pelvis was performed
using the standard protocol during bolus administration of
intravenous contrast. Multiplanar reconstructed images including
MIPs were obtained and reviewed to evaluate the vascular anatomy.
CONTRAST:  100 mL Omnipaque 350

[Series 4: cta mesenteric arterial · axial · arterial · 0.75mm/px · z∈[-474,-116]mm · 11 of 171 slices shown, 13 images]
[im 14/171  soft-tissue]
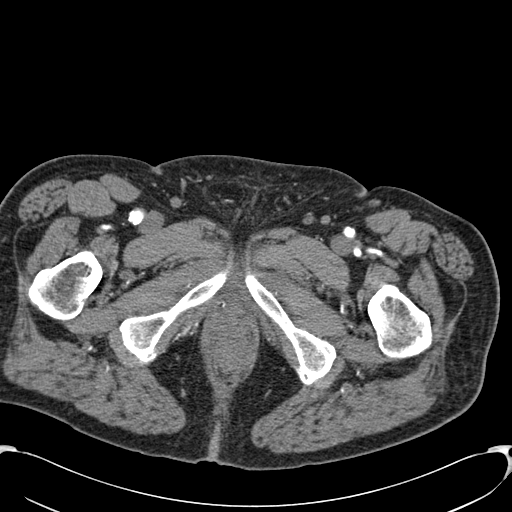
[im 14/171  bone]
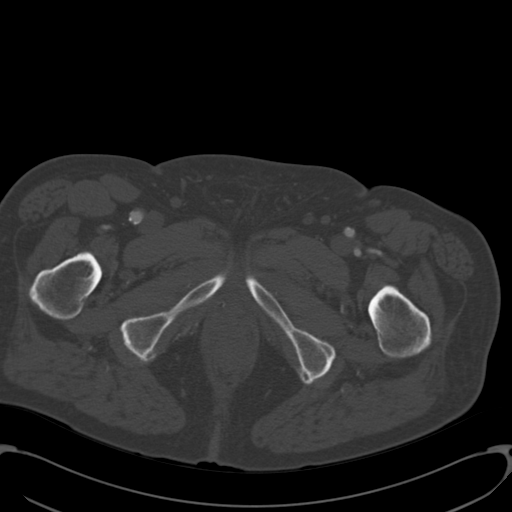
[im 27/171  soft-tissue]
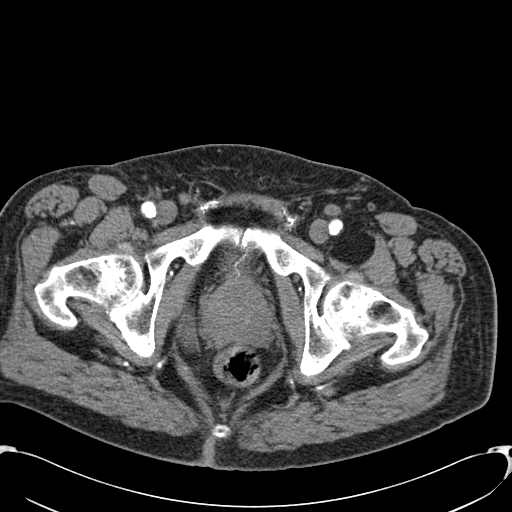
[im 40/171  soft-tissue]
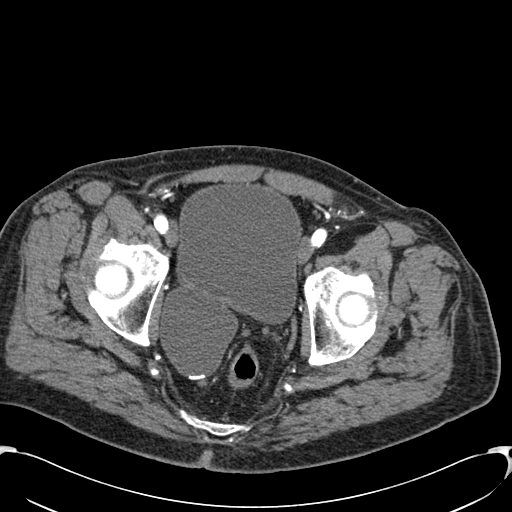
[im 53/171  soft-tissue]
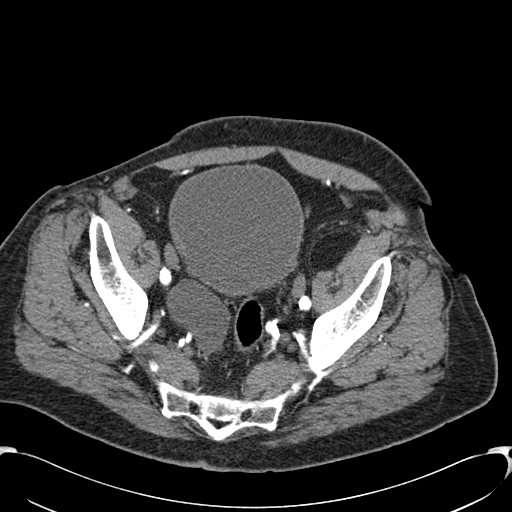
[im 72/171  soft-tissue]
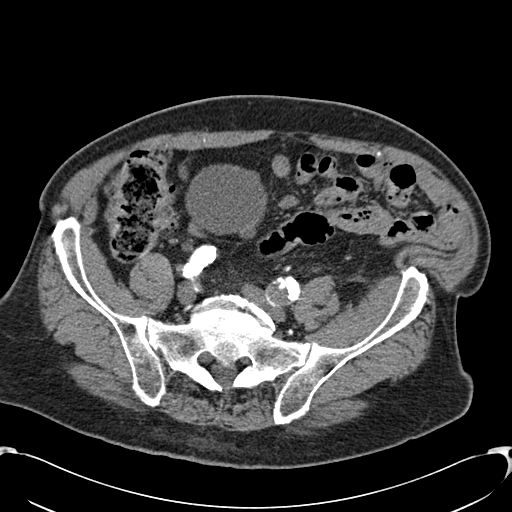
[im 86/171  soft-tissue]
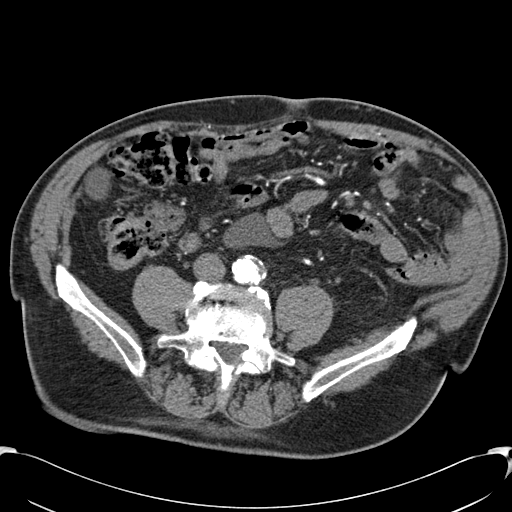
[im 99/171  soft-tissue]
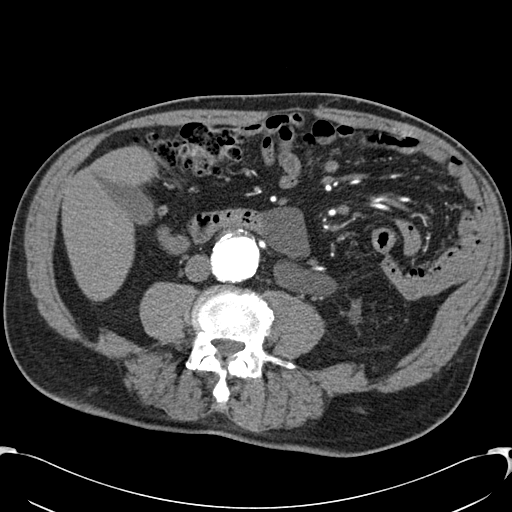
[im 118/171  soft-tissue]
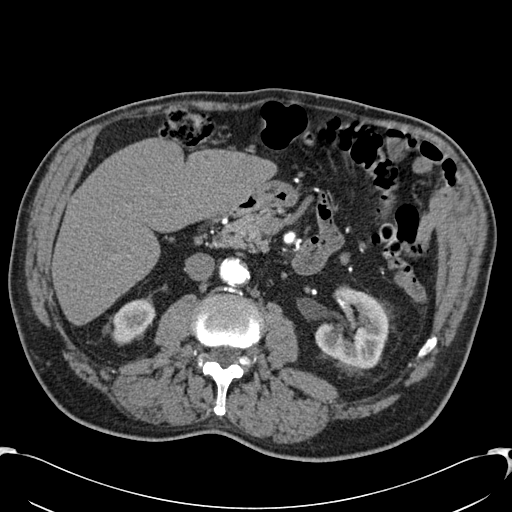
[im 131/171  soft-tissue]
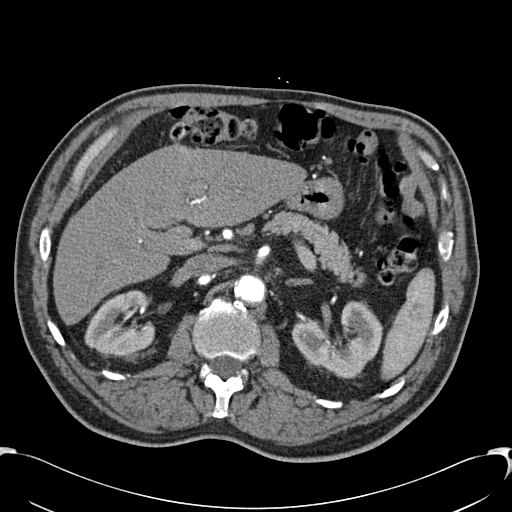
[im 131/171  bone]
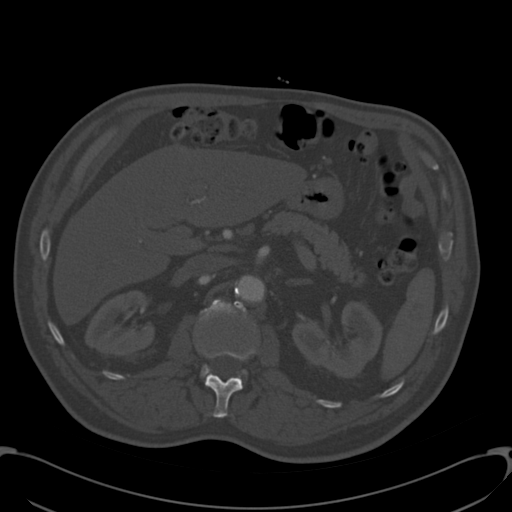
[im 144/171  soft-tissue]
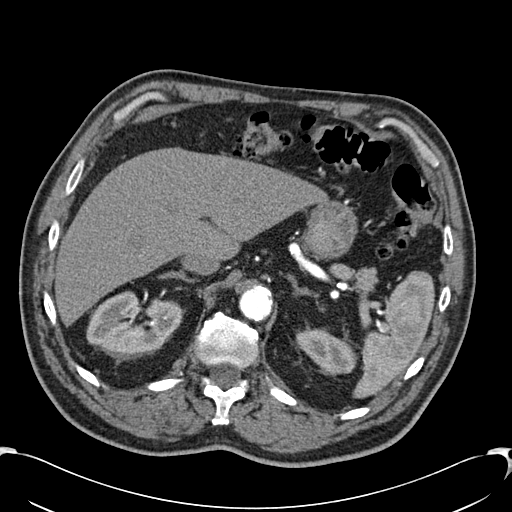
[im 157/171  soft-tissue]
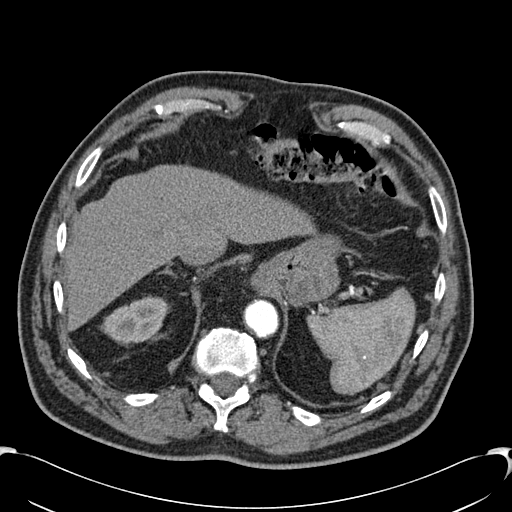

[Series 602: cor mpr · coronal · 0.85mm/px · 3 of 127 slices shown]
[im 32/127  soft-tissue]
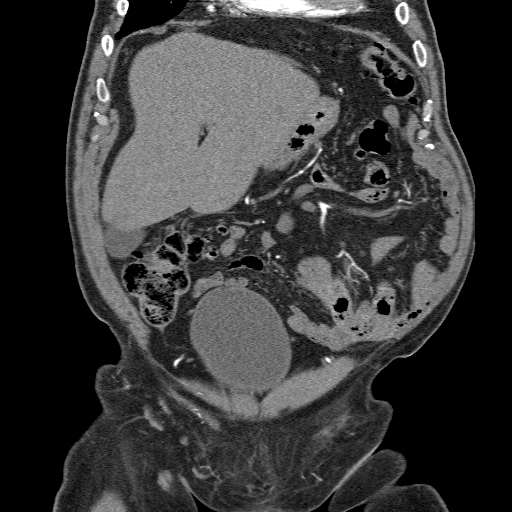
[im 64/127  soft-tissue]
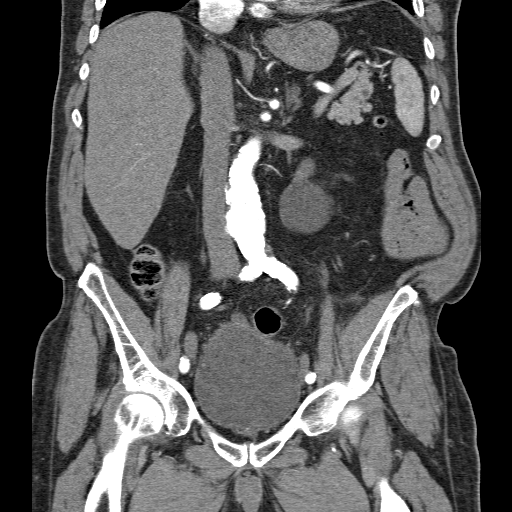
[im 95/127  soft-tissue]
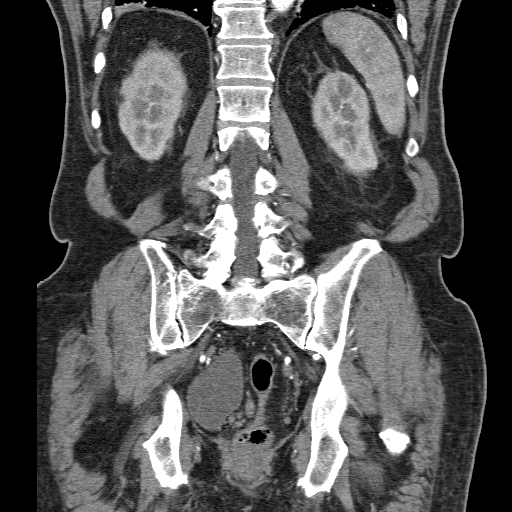

[14 of 46 positions shown; findings below may reference images not displayed]

FINDINGS: ARTERIAL FINDINGS:

Aorta: The suprarenal abdominal aorta has atherosclerotic disease
without aneurysmal dilatation. At the level of the right renal
artery, there is a large mural thrombus along the posterior aspect
of the aorta. The infrarenal abdominal aorta is markedly abnormal.
There is a prominent anterior penetrating ulcer below the renal
arteries. At the level of the IMA, there is an abnormal
configuration of the aorta with contrast and calcifications
peripheral to the central luminal calcifications. The aorta at this
level measures 3.1 x 3.2 cm on sequence 4, 68. Findings raise
concern for a pseudoaneurysm at this level. The more distal
abdominal aorta is also aneurysmal, measuring 3.6 x 4.4 cm on image
76. There is concern for pseudoaneurysm formation just above the
aortic bifurcation and along the anterior aspect of the 4.4 cm
aneurysmal segment. There is no evidence for active bleeding or
extravasation outside of the aorta.

Celiac axis:         Celiac trunk and main vessels are patent.

Superior mesenteric: SMA is patent.

Left renal: At least mild narrowing at the origin of the left renal
artery.

Right renal: Calcified plaque in the right renal artery without
significant stenosis.

Inferior mesenteric: Patent

Left iliac: Diffuse atherosclerotic disease. Focal saccular aneurysm
involving the distal left common iliac artery measuring up to
cm. The left external and internal iliac arteries are patent. The
proximal left femoral arteries are patent.

Right iliac: Right common iliac artery is heavily diseased and
measures up to 1.4 cm. There is flow in the right internal and
external iliac arteries. Proximal right femoral arteries are patent.

Venous findings:     Poorly visualized on this arterial examination.

Review of the MIP images confirms the above findings.

NONVASCULAR FINDINGS:

Patchy densities at the lung bases are incompletely visualized but
could be related to scarring or atelectasis. No evidence for free
air. A trace amount of fluid along the inferior aspect of the liver.
No gross abnormality to the gallbladder. Subtle low-density areas
involving anterior left hepatic lobe are nonspecific. No gross
abnormality to the spleen. There is a 8 mm low-density structure
involving the tail the pancreas which is nonspecific. There is no
significant pancreatic duct dilatation. There is a small hiatal
hernia. No gross abnormality to the adrenal glands or kidneys.

There is a low-density collection located near the ligament of
Treitz that extends caudally into the central mesentery. This
collection is difficult to measure but roughly measures 9.7 x
cm. This collection is fluid density. No significant lymphadenopathy
in the abdomen or pelvis.

The urinary bladder is distended and there is a very large right
posterior diverticulum containing high-density material or stones.
This large bladder diverticulum measures up to 9.1 cm. No
significant hydronephrosis. Prostate is prominent. There is diffuse
colonic diverticulosis. Degenerate disc disease at L5-S1 and L4-L5.
No acute bone abnormality. Incidentally, there appears to be a
lipoma involving the left iliopsoas muscle.
IMPRESSION: Abnormal distal abdominal aorta with severe atherosclerotic disease.
The infrarenal abdominal aorta is very irregular and probably
contains multiple penetrating ulcers. There is concern for
pseudoaneurysm formations due to the penetrating ulcers. Maximum
size of the infrarenal abdominal aorta measures up to 4.4 cm.

Saccular aneurysm of the left common iliac artery measures 2.6 cm.

Low-density collection in the central aspect of the mesentery.
Findings are suggestive for a mesenteric cystic structure. Suspect
this is a benign etiology such as a mesenteric duplication cyst or
lymphangioma. Trace amount of fluid or small fluid collection along
the inferior aspect of the liver is nonspecific.

Dilatation of the urinary bladder with bladder diverticula. Large
diverticulum in the right posterior pelvis contains small stones or
high-density material.

Indeterminate 8 mm low-density structure involving the pancreatic
tail. Consider 1 year follow-up MRI to evaluate the stability of
this finding.

## 2015-09-05 ENCOUNTER — Ambulatory Visit (INDEPENDENT_AMBULATORY_CARE_PROVIDER_SITE_OTHER): Payer: Medicare Other | Admitting: *Deleted

## 2015-09-05 DIAGNOSIS — Z5181 Encounter for therapeutic drug level monitoring: Secondary | ICD-10-CM | POA: Diagnosis not present

## 2015-09-05 DIAGNOSIS — I4891 Unspecified atrial fibrillation: Secondary | ICD-10-CM

## 2015-09-05 LAB — POCT INR: INR: 2.5

## 2015-09-12 ENCOUNTER — Ambulatory Visit (INDEPENDENT_AMBULATORY_CARE_PROVIDER_SITE_OTHER): Payer: Medicare Other | Admitting: Cardiology

## 2015-09-12 ENCOUNTER — Encounter: Payer: Self-pay | Admitting: Cardiology

## 2015-09-12 VITALS — BP 122/80 | HR 67 | Ht 70.0 in | Wt 202.0 lb

## 2015-09-12 DIAGNOSIS — I779 Disorder of arteries and arterioles, unspecified: Secondary | ICD-10-CM

## 2015-09-12 DIAGNOSIS — I739 Peripheral vascular disease, unspecified: Secondary | ICD-10-CM | POA: Diagnosis not present

## 2015-09-12 DIAGNOSIS — I5032 Chronic diastolic (congestive) heart failure: Secondary | ICD-10-CM

## 2015-09-12 DIAGNOSIS — I5022 Chronic systolic (congestive) heart failure: Secondary | ICD-10-CM | POA: Diagnosis not present

## 2015-09-12 DIAGNOSIS — Z953 Presence of xenogenic heart valve: Secondary | ICD-10-CM

## 2015-09-12 DIAGNOSIS — I480 Paroxysmal atrial fibrillation: Secondary | ICD-10-CM | POA: Diagnosis not present

## 2015-09-12 DIAGNOSIS — Z954 Presence of other heart-valve replacement: Secondary | ICD-10-CM

## 2015-09-12 NOTE — Patient Instructions (Signed)
Medication Instructions:  No changes today  Labwork: No lab today.  Testing/Procedures: None today  Follow-Up: Your physician wants you to follow-up in: 4 months with Dr Aundra Dubin. (January 2017).  You will receive a reminder letter in the mail two months in advance. If you don't receive a letter, please call our office to schedule the follow-up appointment.

## 2015-09-13 ENCOUNTER — Encounter: Payer: Self-pay | Admitting: Cardiology

## 2015-09-14 NOTE — Progress Notes (Signed)
Patient ID: Nathaniel [name], male   DOB: 1932/05/08, 81 y.o.   MRN: [id number] PCP: Dr. Elyse Hsu  81 yo with history of extensive vascular disease including CAD s/p CABG and redo CABG, AAA s/p repair, PAD, and carotid stenosis as well as prior AVR presents for cardiology followup.   He has had documentation of significant peripheral arterial disease.  He gets bilateral calf soreness after walking for about 15 minutes.  This resolves with rest, no rest pain.  No pedal ulcers, though when he gets cuts on his feet, they heal slowly.  Peripheral arterial dopplers done in 10/14 showed > 50% focal SFA stenoses bilaterally.  He has at least moderate right common carotid stenosis that may be nearing surgical range.  No stroke-like symptoms.  He had a recent endovascular AAA repair in 5/15.    After his AAA repair, he was noted to be in atrial fibrillation.  He had had a prior episode of atrial fibrillation after his cardiac surgery in 2007.  Atrial fibrillation was rate-controlled.  He was started on warfarin and Toprol XL.  He developed exertional dyspnea and fatigue in atrial fibrillation. I cardioverted him back to NSR on 06/06/14.  I also had him get an echocardiogram in 6/15.  This showed EF worsened to 25-30% with stable bioprosthetic aortic valve, mildly dilated RV with normal systolic function.  Given the fall in EF, I took him for [facility] in 7/15.  This showed stable anatomy.  The LIMA-LAD was patent, the sequential SVG-OM/ramus/D was patent only to ramus but this is the same as the prior cath, and the SVG-PDA was patent with a long 50-60% mid-graft stenosis.  No intervention.  Of note, at cath he was noted to be back in atrial fibrillation.  I then started him on amiodarone.  I took Nathaniel [name] for [facility] again on 08/21/14.  This was successful and he remains in NSR today.  He has been off amiodarone due to side effects (hallucinations).  Echo in 1/16 showed recovery of EF to 55-60%.    Nathaniel [name] has been stable since last  appointment.  No chest pain.  He working out with [employer] in the gym and doing more walking.  He golfs twice a week.  He has occasional vertigo that sounds like BPPV.  No dyspnea walking.  Rare claudication.  No PND.  Sleeps on 3 pillows long-term. He is unable to tolerate more than 20 mg of atorvastatin but is now on Zetia.   ECG: NSR, iLBBB, PVC  Labs (5/15): K 4.3, creatinine 0.89 Labs (6/15): K 4.3, creatinine 1.4, BNP 112 Labs (7/15): K 4.4, creatinine 1.2, HCT 40.2 Labs (8/15): K 4, creatinine 1.2, BNP 113, LFTs and TSH normal Labs (10/15): K 4.4, creatinine 1.4 Labs (5/16): K 4.7, creatinine 1.18, HDL 52, LDL 98 Labs (6/16): LDL 118, LFTs normal  PMH: 1. PAD: Peripheral arterial dopplers in 2012 showed > 75% bilateral SFA stenosis. Peripheral arterial dopplers in 10/14 showed > 50% focal bilateral SFA stenoses.  2. AAA: [place] 4/15 with 4.4 cm AAA but concern for penetrating ulcers. CTA abdomen showed 4.4 cm AAA with penetrating ulcers and possible pseudoaneurysms. Now s/p endovascular repair of AAA in 0/27 with no complications.  3. Carotid stenosis: Carotid dopplers (4/15) with 60-79% bilateral ICA stenosis, RICA may be near surgical range. Carotid dopplers (11/15) with 60-79% bilateral ICA stenosis.  Carotid dopplers 6/16 with 60-79% bilateral ICA stenosis. 4. CAD: CABG 1989 with LIMA-LAD, SVG-D, seq SVG-ramus and OM, sequential SVG-PDA/PLV.  SVG-PDA/PLV found to be occluded on cath prior to AVR, so patient had SVG-PDA with AVR in 2007. LHC (7/15) with LIMA-LAD patent with 40-50% stenosis in LAD after touchdown, sequential SVG-ramus/OM/diagonal with only the ramus branch still intact (known from prior cath), SVG-PDA from original surgery TO, SVG-PDA from redo surgery with long 50-60% mid-graft stenosis.  No target for intervention.  5. Severe aortic stenosis: Bioprosthetic AVR in 2007. Echo (11/10) with EF 60-65%, basal to mid inferior hypokinesis, bioprosthetic aortic valve without significant  stenosis/regurgitation, mild Nathaniel. Echo (11/14) with EF 55-60%, mild LVH, bioprosthetic aortic valve looked ok. Bioprosthetic valve looked ok on 6/15 echo.   6. Atrial fibrillation: Paroxysmal. Initially noted after cardiac surgery, then again after AAA repair.  DCCV to NSR 06/06/14.  DCCV to NSR again 08/21/14.  7. Type II diabetes  8. Gout  9. GERD  10. Hyperlipidemia  11. HTN 12. GERD  13. Chronic systolic CHF: Echo (8/58) with EF worsened to 25-30% with grade II diastolic dysfunction, stable bioprosthetic aortic valve, mildly dilated RV with normal systolic function.  Echo (1/16) with EF 55-60%, basal inferior hypokinesis, mild LVH, bioprosthetic aortic valve with mean gradient 13 mmHg, mild Nathaniel, severe LAE.   SH: Widower, 2 children, raised his 3 grand-daughters, lives in [place], [place].  Occasional ETOH, no smoking.   FH: Father died from ruptured AAA.   ROS: All systems reviewed and negative except as per HPI.   Current Outpatient Prescriptions  Medication Sig Dispense Refill  . allopurinol (ZYLOPRIM) 100 MG tablet Take 100 mg by mouth daily.      Marland Kitchen atorvastatin (LIPITOR) 20 MG tablet Take 1 tablet (20 mg total) by mouth daily. 30 tablet 6  . beta carotene w/minerals (OCUVITE) tablet Take 1 tablet by mouth daily.    . Coenzyme Q10 (CO Q 10) 100 MG CAPS Take 100 mg by mouth daily.     Marland Kitchen doxazosin (CARDURA) 1 MG tablet Take 1-2 mg by mouth 2 (two) times daily. Take 2 tablets in the morning and take 1 tablet in the evening    . furosemide (LASIX) 40 MG tablet '40mg'$  alternating with '20mg'$  daily in the morning 30 tablet 6  . glucosamine-chondroitin 500-400 MG tablet Take 1 tablet by mouth daily.    . metoprolol succinate (TOPROL-XL) 25 MG 24 hr tablet Take 25 mg by mouth daily as needed.    . Omega-3 Fatty Acids (FISH OIL) 1000 MG CAPS Take 1,000-2,000 mg by mouth 2 (two) times daily. Take 2000 mg in the morning and 1000 mg in the evening    . ramipril (ALTACE) 5 MG capsule Take 5 mg by  mouth daily.    . sitaGLIPtin (JANUVIA) 100 MG tablet Take 100 mg by mouth daily.    Marland Kitchen spironolactone (ALDACTONE) 25 MG tablet Take 1 tablet (25 mg total) by mouth daily. 30 tablet 11  . warfarin (COUMADIN) 3 MG tablet USE AS DIRECTED BY ANTICOAGULATION CLINIC 70 tablet 3  . ZETIA 10 MG tablet Take 10 mg by mouth daily.     Marland Kitchen atorvastatin (LIPITOR) 10 MG tablet Take 1 tablet (10 mg total) by mouth daily. (Patient not taking: Reported on 09/12/2015) 30 tablet 6   No current facility-administered medications for this visit.    BP 122/80 mmHg  Pulse 67  Ht '5\' 10"'$  (1.778 m)  Wt 202 lb (91.627 kg)  BMI 28.98 kg/m2 General: NAD Neck: JVP 7 cm, no thyromegaly or thyroid nodule.  Lungs: Clear to auscultation bilaterally with normal  respiratory effort. CV: Nondisplaced PMI.  Heart regular S1/S2, no S3/S4, 2/6 early SEM RUSB.  No edema.  Soft right carotid bruit.  1+ left PT, unable to feel right PT.   Abdomen: Soft, nontender, no hepatosplenomegaly, no distention. Ventral hernia.  Skin: Intact without lesions or rashes.  Neurologic: Alert and oriented x 3.  Psych: Normal affect. Extremities: No clubbing or cyanosis.   Assessment/Plan:  1. CAD: s/p CABG and redo CABG.  Given fall in EF, I did a cardiac cath in 7/15 as documented above. Unfortunately, despite significant coronary disease, there were no good interventional targets. - Continue statin, ramipril, Toprol XL.   - He is on warfarin in setting of stable CAD so not on ASA 81.  2. Hyperlipidemia: He is unable to tolerate more than 20 mg atorvastatin due to myalgias.  He is now on Zetia.  Labs were done recently by Dr Altheimer, will call for a copy.   3. PAD: Patient has moderate stable claudication symptoms.  No rest symptoms or foot ulcers. Peripheral arterial dopplers showed > 50% bilateral focal SFA stenoses in the past.  He is not a cilostazol candidate with CHF.  Symptoms curently seem minimal. Symptoms have been stable and is now  followed by [facility].  4. Carotid stenosis: Moderate bilateral stenosis.  He has had no stroke-like symptoms.  I will get repeat carotid dopplers in 12/16. 5. AAA: Successful endovascular repair in 5/15.  6. Bioprosthetic AVR: Valve stable on 1/16 echo.  7. Atrial fibrillation: He has been cardioverted twice and is now holding NSR.  He is on warfarin.  He has symptomatic fatigue and dyspnea while in atrial fibrillation, so ideally we would keep him out.  He is now off amiodarone given possible CNS side effects.   - If he has atrial fibrillation recurrence, will need to consider ablation versus Tikosyn.  He is very symptomatic in atrial fibrillation.  8. Chronic diastolic CHF: Suspect ischemic cardiomyopathy.  EF 25-30% in 6/15 but had normalized to 55-60% by 1/16.  NYHA class II symptoms now, stable.  He does not look volume overloaded today.  - Continue Lasix 40 mg daily alternating with 20 mg daily.   Will get recent BMET form PCP. - Continue ramipril, spironolactone, Toprol XL.   Followup in 4 months.   [name] 09/14/2015

## 2015-09-18 ENCOUNTER — Ambulatory Visit (INDEPENDENT_AMBULATORY_CARE_PROVIDER_SITE_OTHER): Payer: Medicare Other | Admitting: Internal Medicine

## 2015-09-18 ENCOUNTER — Encounter: Payer: Self-pay | Admitting: Internal Medicine

## 2015-09-18 VITALS — BP 112/60 | HR 64 | Ht 70.0 in | Wt 203.2 lb

## 2015-09-18 DIAGNOSIS — K5901 Slow transit constipation: Secondary | ICD-10-CM | POA: Diagnosis not present

## 2015-09-18 DIAGNOSIS — Z7901 Long term (current) use of anticoagulants: Secondary | ICD-10-CM

## 2015-09-18 DIAGNOSIS — Z860100 Personal history of colon polyps, unspecified: Secondary | ICD-10-CM

## 2015-09-18 DIAGNOSIS — K219 Gastro-esophageal reflux disease without esophagitis: Secondary | ICD-10-CM

## 2015-09-18 DIAGNOSIS — R350 Frequency of micturition: Secondary | ICD-10-CM

## 2015-09-18 DIAGNOSIS — R159 Full incontinence of feces: Secondary | ICD-10-CM

## 2015-09-18 MED ORDER — OMEPRAZOLE 20 MG PO CPDR: 20.0000 mg | DELAYED_RELEASE_CAPSULE | Freq: Every day | ORAL | Status: DC

## 2015-09-18 NOTE — Progress Notes (Signed)
HISTORY OF PRESENT ILLNESS:  Nathaniel [name] is a 81 y.o. male with multiple significant medical problems as listed below. He presents today regarding surveillance colonoscopy, complaints of acid reflux, and difficulty cleaning his bottom after defecation. First, the patient was last evaluated in the office April 2012 regarding minor intermittent rectal bleeding and fecal soilage with burning. See that dictation. He subsequently underwent complete colonoscopy 04/07/2011. He was found to have 3 polyps ranging between 7 and 15 mm. These were tubular adenomas. Routine follow-up, if medically fit, in 3 years. Since that time, patient has had a decline in his overall health. He underwent abdominal aortic endovascular stent graft in May 2015. Slow to recover thereafter. Subsequently developed atrial fibrillation for which she is now on Coumadin. Decreased overall exercise tolerance though still playing some limited golf. He denies any change in his bowel habits. He does describe constipation if he does not take Metamucil as previously recommended. He also describes difficulty cleaning himself after defecation with somewhat sticky stools. He denies abdominal pain or bleeding. Review of outside records shows normal liver tests June 2016. INR has been stable at about 2.5. Neck: He complains of intermittent heartburn several times per week. Worse when consuming onions. He has been on PPI in the past with good results. No dysphagia. Patient has multiple questions regarding GI non-GI complaints. Finally, he complains of significant urinary frequency. This is most troublesome in the morning. He has not been evaluated but it seems to be under the impression that I manage urologic issues.  REVIEW OF SYSTEMS:  All non-GI ROS negative except for excessive urination, shortness of breath,  Past Medical History  Diagnosis Date  . CAD (coronary artery disease)   . Atrial fibrillation ([place])   . Diabetes mellitus   .  Hyperlipemia   . Hx: UTI (urinary tract infection)   . Gout   . Hypertension   . Cataract   . GERD (gastroesophageal reflux disease)   . Peripheral vascular disease ([place])   . Atrial fibrillation ([place])   . Shortness of breath   . Hernia, abdominal   . CHF (congestive heart failure) ([place])   . Colon polyps     adenomatous  . Diverticulosis   . Internal hemorrhoids     Past Surgical History  Procedure Laterality Date  . Appendectomy    . Cardiac valve surgery    . Angioplasty      Unsure if he had stents put in   . Coronary artery bypass graft    . Tonsillectomy    . Eye surgery Bilateral     cataract surgery  . Hernia repair      2 operations  . Abdominal aortic endovascular stent graft N/A 04/13/2014    Procedure: ABDOMINAL AORTIC ENDOVASCULAR STENT GRAFT;  Surgeon: Serafina Mitchell, MD;  Location: [facility];  Service: Vascular;  Laterality: N/A;  . Cardioversion N/A 06/06/2014    Procedure: CARDIOVERSION;  Surgeon: Larey Dresser, MD;  Location: [facility] ENDOSCOPY;  Service: Cardiovascular;  Laterality: N/A;  . Cardioversion N/A 08/21/2014    Procedure: CARDIOVERSION;  Surgeon: Larey Dresser, MD;  Location: [facility];  Service: Cardiovascular;  Laterality: N/A;  . Left heart catheterization with coronary/graft angiogram N/A 07/10/2014    Procedure: LEFT HEART CATHETERIZATION WITH Beatrix Fetters;  Surgeon: Larey Dresser, MD;  Location: [facility] CATH LAB;  Service: Cardiovascular;  Laterality: N/A;    Social History ROBI DEWOLFE  reports that he has quit smoking. His smoking use  included Cigarettes. He quit after 20 years of use. He has never used smokeless tobacco. He reports that he drinks about 6.0 oz of alcohol per week. He reports that he does not use illicit drugs.  family history includes AAA (abdominal aortic aneurysm) in his father; Heart disease in his father and mother. There is no history of Colon cancer.  No Known Allergies     PHYSICAL EXAMINATION: Vital signs:  BP 112/60 mmHg  Pulse 64  Ht '5\' 10"'$  (1.778 m)  Wt 203 lb 3.2 oz (92.171 kg)  BMI 29.16 kg/m2  Constitutional: Elderly male, somewhat frail compared to last visit, no acute distress Psychiatric: alert and oriented x3, cooperative with slight difficulty with recall Eyes: extraocular movements intact, anicteric, conjunctiva pink Mouth: oral pharynx moist, no lesions Neck: supple without thyromegaly Lymph: no lymphadenopathy Cardiovascular: heart regular rate and rhythm, 2/6 systolic murmur Lungs: clear to auscultation bilaterally Abdomen: soft, nontender, nondistended, no obvious ascites, no peritoneal signs, normal bowel sounds, no organomegaly Rectal: Ommitted Extremities: no clubbing or cyanosis. Trace lower extremity edema bilaterally Skin: no lesions on visible extremities Neuro: No focal deficits. No asterixis.   ASSESSMENT:  #1. History of colon polyps. Last colonoscopy April 2012 with 3 adenomas as described and severe diverticulosis. No worrisome GI symptoms. Overall decline in his health with multiple medical problems and on multiple medications including Coumadin. At this point, I feel that the risk of surveillance colonoscopy outweighs the potential benefit. We discussed this in detail. He understands and is satisfied #2. GERD. Frequent symptoms without alarm features #3. Elderly gentleman with multiple comorbidities on Coumadin. May benefit from PPI for upper GI mucosal protection as supported by studies #4. Chronic stable constipation and difficulty with fecal soilage #5. Urinary frequency   PLAN:  #1. Suspend any plans for surveillance colonoscopy as discussed above #2. Reflux precautions. Reviewed with patient #3. Initiate omeprazole 20 mg daily. Prescribed. This for control of GERD symptoms as well as protection of upper GI mucosa given his age,, comorbidities, and chronic Coumadin therapy. I reviewed this with him.  #4. Instructed to increase Metamucil somewhat see if  this helps with fecal soilage in bowel movements #5. Instructed to see urologist regarding significant problems with urinary frequency #6. Return to the care of your primary provider. GI follow-up as needed  30 minutes was spent face-to-face with the patient today. Greater than 50% of the time was spent counseling him regarding his multiple GI issues, non-GI complaint, answering questions, and explaining the treatment plan

## 2015-09-18 NOTE — Patient Instructions (Signed)
We have sent the following medications to your pharmacy for you to pick up at your convenience:  Omeprazole  

## 2015-10-17 ENCOUNTER — Ambulatory Visit (INDEPENDENT_AMBULATORY_CARE_PROVIDER_SITE_OTHER): Payer: Medicare Other | Admitting: *Deleted

## 2015-10-17 DIAGNOSIS — I4891 Unspecified atrial fibrillation: Secondary | ICD-10-CM

## 2015-10-17 DIAGNOSIS — Z5181 Encounter for therapeutic drug level monitoring: Secondary | ICD-10-CM | POA: Diagnosis not present

## 2015-10-17 LAB — POCT INR: INR: 2.5

## 2015-10-21 ENCOUNTER — Other Ambulatory Visit: Payer: Self-pay | Admitting: Cardiology

## 2015-10-29 DIAGNOSIS — E782 Mixed hyperlipidemia: Secondary | ICD-10-CM | POA: Diagnosis not present

## 2015-10-29 DIAGNOSIS — E1165 Type 2 diabetes mellitus with hyperglycemia: Secondary | ICD-10-CM | POA: Diagnosis not present

## 2015-10-29 DIAGNOSIS — E1159 Type 2 diabetes mellitus with other circulatory complications: Secondary | ICD-10-CM | POA: Diagnosis not present

## 2015-10-31 DIAGNOSIS — E782 Mixed hyperlipidemia: Secondary | ICD-10-CM | POA: Diagnosis not present

## 2015-10-31 DIAGNOSIS — E1165 Type 2 diabetes mellitus with hyperglycemia: Secondary | ICD-10-CM | POA: Diagnosis not present

## 2015-10-31 DIAGNOSIS — M109 Gout, unspecified: Secondary | ICD-10-CM | POA: Diagnosis not present

## 2015-10-31 DIAGNOSIS — N183 Chronic kidney disease, stage 3 (moderate): Secondary | ICD-10-CM | POA: Diagnosis not present

## 2015-10-31 DIAGNOSIS — E1159 Type 2 diabetes mellitus with other circulatory complications: Secondary | ICD-10-CM | POA: Diagnosis not present

## 2015-10-31 DIAGNOSIS — I2510 Atherosclerotic heart disease of native coronary artery without angina pectoris: Secondary | ICD-10-CM | POA: Diagnosis not present

## 2015-10-31 DIAGNOSIS — I10 Essential (primary) hypertension: Secondary | ICD-10-CM | POA: Diagnosis not present

## 2015-10-31 DIAGNOSIS — I70203 Unspecified atherosclerosis of native arteries of extremities, bilateral legs: Secondary | ICD-10-CM | POA: Diagnosis not present

## 2015-11-13 ENCOUNTER — Other Ambulatory Visit: Payer: Self-pay | Admitting: Cardiology

## 2015-11-13 DIAGNOSIS — I6523 Occlusion and stenosis of bilateral carotid arteries: Secondary | ICD-10-CM

## 2015-11-19 ENCOUNTER — Other Ambulatory Visit: Payer: Self-pay | Admitting: Cardiology

## 2015-11-27 ENCOUNTER — Encounter: Payer: Self-pay | Admitting: Surgery

## 2015-11-28 ENCOUNTER — Ambulatory Visit (INDEPENDENT_AMBULATORY_CARE_PROVIDER_SITE_OTHER): Payer: Medicare Other | Admitting: *Deleted

## 2015-11-28 DIAGNOSIS — I4891 Unspecified atrial fibrillation: Secondary | ICD-10-CM | POA: Diagnosis not present

## 2015-11-28 DIAGNOSIS — Z5181 Encounter for therapeutic drug level monitoring: Secondary | ICD-10-CM

## 2015-11-28 LAB — POCT INR: INR: 2

## 2015-11-29 ENCOUNTER — Ambulatory Visit (HOSPITAL_COMMUNITY)
Admission: RE | Admit: 2015-11-29 | Discharge: 2015-11-29 | Disposition: A | Discharge status: Home / Self Care | Payer: Medicare Other | Source: Ambulatory Visit | Attending: Cardiology | Admitting: Cardiology

## 2015-11-29 DIAGNOSIS — E785 Hyperlipidemia, unspecified: Secondary | ICD-10-CM | POA: Insufficient documentation

## 2015-11-29 DIAGNOSIS — E119 Type 2 diabetes mellitus without complications: Secondary | ICD-10-CM | POA: Diagnosis not present

## 2015-11-29 DIAGNOSIS — I10 Essential (primary) hypertension: Secondary | ICD-10-CM | POA: Diagnosis not present

## 2015-11-29 DIAGNOSIS — I6523 Occlusion and stenosis of bilateral carotid arteries: Secondary | ICD-10-CM | POA: Insufficient documentation

## 2015-12-02 ENCOUNTER — Ambulatory Visit: Payer: Medicare Other | Admitting: Surgery

## 2015-12-02 ENCOUNTER — Inpatient Hospital Stay (HOSPITAL_COMMUNITY): Admission: RE | Admit: 2015-12-02 | Payer: Medicare Other | Source: Ambulatory Visit

## 2015-12-11 ENCOUNTER — Encounter: Payer: Self-pay | Admitting: *Deleted

## 2015-12-23 ENCOUNTER — Inpatient Hospital Stay (HOSPITAL_COMMUNITY)
Admission: EM | Admit: 2015-12-23 | Discharge: 2015-12-25 | DRG: 206 | Disposition: A | Discharge status: Home / Self Care | Payer: Medicare Other | Attending: Pulmonary Disease | Admitting: Pulmonary Disease

## 2015-12-23 ENCOUNTER — Emergency Department (HOSPITAL_COMMUNITY): Payer: Medicare Other

## 2015-12-23 ENCOUNTER — Encounter (HOSPITAL_COMMUNITY): Payer: Self-pay | Admitting: *Deleted

## 2015-12-23 DIAGNOSIS — I5042 Chronic combined systolic (congestive) and diastolic (congestive) heart failure: Secondary | ICD-10-CM | POA: Diagnosis present

## 2015-12-23 DIAGNOSIS — Z7901 Long term (current) use of anticoagulants: Secondary | ICD-10-CM | POA: Diagnosis not present

## 2015-12-23 DIAGNOSIS — I110 Hypertensive heart disease with heart failure: Secondary | ICD-10-CM | POA: Diagnosis present

## 2015-12-23 DIAGNOSIS — R911 Solitary pulmonary nodule: Secondary | ICD-10-CM | POA: Diagnosis present

## 2015-12-23 DIAGNOSIS — I482 Chronic atrial fibrillation: Secondary | ICD-10-CM | POA: Diagnosis not present

## 2015-12-23 DIAGNOSIS — X58XXXA Exposure to other specified factors, initial encounter: Secondary | ICD-10-CM | POA: Diagnosis not present

## 2015-12-23 DIAGNOSIS — Z951 Presence of aortocoronary bypass graft: Secondary | ICD-10-CM | POA: Diagnosis not present

## 2015-12-23 DIAGNOSIS — R918 Other nonspecific abnormal finding of lung field: Secondary | ICD-10-CM | POA: Diagnosis not present

## 2015-12-23 DIAGNOSIS — Z9889 Other specified postprocedural states: Secondary | ICD-10-CM | POA: Diagnosis not present

## 2015-12-23 DIAGNOSIS — T189XXA Foreign body of alimentary tract, part unspecified, initial encounter: Secondary | ICD-10-CM | POA: Diagnosis not present

## 2015-12-23 DIAGNOSIS — Z86010 Personal history of colonic polyps: Secondary | ICD-10-CM | POA: Diagnosis not present

## 2015-12-23 DIAGNOSIS — H269 Unspecified cataract: Secondary | ICD-10-CM | POA: Diagnosis present

## 2015-12-23 DIAGNOSIS — Y9289 Other specified places as the place of occurrence of the external cause: Secondary | ICD-10-CM

## 2015-12-23 DIAGNOSIS — E1151 Type 2 diabetes mellitus with diabetic peripheral angiopathy without gangrene: Secondary | ICD-10-CM | POA: Diagnosis present

## 2015-12-23 DIAGNOSIS — E119 Type 2 diabetes mellitus without complications: Secondary | ICD-10-CM

## 2015-12-23 DIAGNOSIS — Z79899 Other long term (current) drug therapy: Secondary | ICD-10-CM | POA: Diagnosis not present

## 2015-12-23 DIAGNOSIS — K219 Gastro-esophageal reflux disease without esophagitis: Secondary | ICD-10-CM | POA: Diagnosis present

## 2015-12-23 DIAGNOSIS — Z87891 Personal history of nicotine dependence: Secondary | ICD-10-CM

## 2015-12-23 DIAGNOSIS — Z8249 Family history of ischemic heart disease and other diseases of the circulatory system: Secondary | ICD-10-CM | POA: Diagnosis not present

## 2015-12-23 DIAGNOSIS — I4891 Unspecified atrial fibrillation: Secondary | ICD-10-CM | POA: Diagnosis not present

## 2015-12-23 DIAGNOSIS — I10 Essential (primary) hypertension: Secondary | ICD-10-CM | POA: Diagnosis not present

## 2015-12-23 DIAGNOSIS — Z9861 Coronary angioplasty status: Secondary | ICD-10-CM | POA: Diagnosis not present

## 2015-12-23 DIAGNOSIS — M109 Gout, unspecified: Secondary | ICD-10-CM | POA: Diagnosis present

## 2015-12-23 DIAGNOSIS — T17908A Unspecified foreign body in respiratory tract, part unspecified causing other injury, initial encounter: Secondary | ICD-10-CM | POA: Diagnosis present

## 2015-12-23 DIAGNOSIS — Z794 Long term (current) use of insulin: Secondary | ICD-10-CM

## 2015-12-23 DIAGNOSIS — IMO0001 Reserved for inherently not codable concepts without codable children: Secondary | ICD-10-CM | POA: Insufficient documentation

## 2015-12-23 DIAGNOSIS — Z953 Presence of xenogenic heart valve: Secondary | ICD-10-CM | POA: Diagnosis not present

## 2015-12-23 DIAGNOSIS — T17900A Unspecified foreign body in respiratory tract, part unspecified causing asphyxiation, initial encounter: Secondary | ICD-10-CM

## 2015-12-23 DIAGNOSIS — T17500A Unspecified foreign body in bronchus causing asphyxiation, initial encounter: Secondary | ICD-10-CM | POA: Diagnosis not present

## 2015-12-23 DIAGNOSIS — I2510 Atherosclerotic heart disease of native coronary artery without angina pectoris: Secondary | ICD-10-CM | POA: Diagnosis present

## 2015-12-23 DIAGNOSIS — T17590A Other foreign object in bronchus causing asphyxiation, initial encounter: Principal | ICD-10-CM | POA: Diagnosis present

## 2015-12-23 DIAGNOSIS — T17508A Unspecified foreign body in bronchus causing other injury, initial encounter: Secondary | ICD-10-CM | POA: Diagnosis not present

## 2015-12-23 MED ORDER — ATORVASTATIN CALCIUM 20 MG PO TABS
20.0000 mg | ORAL_TABLET | Freq: Every day | ORAL | Status: DC
  Administered 2015-12-24: 20 mg via ORAL
  Filled 2015-12-23: qty 1

## 2015-12-23 MED ORDER — SODIUM CHLORIDE 0.9 % IJ SOLN
3.0000 mL | Freq: Two times a day (BID) | INTRAMUSCULAR | Status: DC
  Administered 2015-12-23 – 2015-12-24 (×2): 3 mL via INTRAVENOUS

## 2015-12-23 MED ORDER — PANTOPRAZOLE SODIUM 40 MG PO TBEC
40.0000 mg | DELAYED_RELEASE_TABLET | Freq: Every day | ORAL | Status: DC
  Administered 2015-12-24: 40 mg via ORAL
  Filled 2015-12-23: qty 1

## 2015-12-23 MED ORDER — RAMIPRIL 5 MG PO CAPS
5.0000 mg | ORAL_CAPSULE | Freq: Every day | ORAL | Status: DC
  Administered 2015-12-24: 5 mg via ORAL
  Filled 2015-12-23: qty 1

## 2015-12-23 MED ORDER — SODIUM CHLORIDE 0.9 % IV SOLN
INTRAVENOUS | Status: DC
  Administered 2015-12-24: 01:00:00 via INTRAVENOUS

## 2015-12-23 MED ORDER — DOXAZOSIN MESYLATE 2 MG PO TABS
2.0000 mg | ORAL_TABLET | Freq: Every day | ORAL | Status: DC
  Administered 2015-12-24: 2 mg via ORAL
  Filled 2015-12-23 (×2): qty 2

## 2015-12-23 MED ORDER — EZETIMIBE 10 MG PO TABS
10.0000 mg | ORAL_TABLET | Freq: Every day | ORAL | Status: DC
  Administered 2015-12-24: 10 mg via ORAL
  Filled 2015-12-23: qty 1

## 2015-12-23 NOTE — H&P (Signed)
Name: Nathaniel [name] MRN: [id number] DOB: 1932/07/03    ADMISSION DATE:  12/23/2015 CONSULTATION DATE:  1/9  REFERRING MD :  EDP  CHIEF COMPLAINT:  Foreign body aspiraiton  BRIEF PATIENT DESCRIPTION: 82 year old male presented to ED 1/9 after aspirating permanent crown while at dentist office.   SIGNIFICANT EVENTS  1/9 crown aspiration, admit  STUDIES:    HISTORY OF PRESENT ILLNESS:  82 year old male with PMH as below, which includes CAD s/p CABG, AF on coumadin, HTN, GERD, PAD, carotid stenosis, Severe AS,  and dCHF (LVEF 55-60% 1/16). He was at the dentist 1/9 when a permanent crown became dislodged and was aspirated into his airway. He was sent to urgent care for evaluation of this and chest X ray showed foreign body aspirated into L lower lobe. He was sent to ED where CXR again confirms this. PCCM to admit for possible retrieval via bronchoscopy.   PAST MEDICAL HISTORY :   has a past medical history of CAD (coronary artery disease); Atrial fibrillation ([place]); Diabetes mellitus; Hyperlipemia; UTI (urinary tract infection); Gout; Hypertension; Cataract; GERD (gastroesophageal reflux disease); Peripheral vascular disease ([place]); Atrial fibrillation ([place]); Shortness of breath; Hernia, abdominal; CHF (congestive heart failure) ([place]); Colon polyps; Diverticulosis; and Internal hemorrhoids.  has past surgical history that includes Appendectomy; Cardiac valuve replacement; Angioplasty; Coronary artery bypass graft; Tonsillectomy; Eye surgery (Bilateral); Hernia repair; Abdominal aortic endovascular stent graft (N/A, 04/13/2014); Cardioversion (N/A, 06/06/2014); Cardioversion (N/A, 08/21/2014); and left heart catheterization with coronary/graft angiogram (N/A, 07/10/2014). Prior to Admission medications   Medication Sig Start Date End Date Taking? Authorizing Provider  allopurinol (ZYLOPRIM) 100 MG tablet Take 100 mg by mouth daily.     Yes Historical Provider, MD  atorvastatin (LIPITOR) 20 MG tablet  Take 1 tablet (20 mg total) by mouth daily. 05/14/15  Yes Larey Dresser, MD  beta carotene w/minerals (OCUVITE) tablet Take 1 tablet by mouth daily.   Yes Historical Provider, MD  Coenzyme Q10 (CO Q 10) 100 MG CAPS Take 100 mg by mouth daily.    Yes Historical Provider, MD  doxazosin (CARDURA) 1 MG tablet Take 1-2 mg by mouth 2 (two) times daily. Take 2 tablets in the morning and take 1 tablet in the evening 03/09/11  Yes Historical Provider, MD  furosemide (LASIX) 40 MG tablet Take 40 mg by mouth every other day.   Yes Historical Provider, MD  glucosamine-chondroitin 500-400 MG tablet Take 1 tablet by mouth daily.   Yes Historical Provider, MD  metoprolol succinate (TOPROL-XL) 25 MG 24 hr tablet Take 25 mg by mouth daily as needed. Take one tablet by mouth every other day because if makes me feel sick per patient   Yes Historical Provider, MD  Omega-3 Fatty Acids (FISH OIL) 1000 MG CAPS Take 1,000-2,000 mg by mouth 2 (two) times daily. Take 2000 mg in the morning and 1000 mg in the evening   Yes Historical Provider, MD  ramipril (ALTACE) 5 MG capsule Take 5 mg by mouth daily.   Yes Historical Provider, MD  sitaGLIPtin (JANUVIA) 100 MG tablet Take 100 mg by mouth daily.   Yes Historical Provider, MD  spironolactone (ALDACTONE) 25 MG tablet TAKE ONE TABLET EACH DAY 10/22/15  Yes Larey Dresser, MD  warfarin (COUMADIN) 3 MG tablet Take 6 mg by mouth daily.   Yes Historical Provider, MD  ZETIA 10 MG tablet Take 10 mg by mouth daily.  03/03/11  Yes Historical Provider, MD  atorvastatin (LIPITOR) 10 MG  tablet Take 1 tablet (10 mg total) by mouth daily. Patient not taking: Reported on 12/23/2015 05/14/15   Larey Dresser, MD  furosemide (LASIX) 40 MG tablet '40mg'$  alternating with '20mg'$  daily in the morning Patient not taking: Reported on 12/23/2015 05/14/15   Larey Dresser, MD  omeprazole (PRILOSEC) 20 MG capsule Take 1 capsule (20 mg total) by mouth daily. Patient not taking: Reported on 12/23/2015 09/18/15    Irene Shipper, MD  warfarin (COUMADIN) 3 MG tablet USE AS DIRECTED BY ANTICOAGULATION CLINIC Patient not taking: Reported on 12/23/2015 11/19/15   Larey Dresser, MD   No Known Allergies  FAMILY HISTORY:  family history includes AAA (abdominal aortic aneurysm) in his father; Heart disease in his father and mother. There is no history of Colon cancer. SOCIAL HISTORY:  reports that he has quit smoking. His smoking use included Cigarettes. He quit after 20 years of use. He has never used smokeless tobacco. He reports that he drinks about 6.0 oz of alcohol per week. He reports that he does not use illicit drugs.  REVIEW OF SYSTEMS:   Bolds are positive  Constitutional: weight loss, gain, night sweats, Fevers, chills, fatigue .  HEENT: headaches, Sore throat, sneezing, nasal congestion, post nasal drip, Difficulty swallowing, Tooth/dental problems, visual complaints visual changes, ear ache CV:  chest pain, radiates: ,Orthopnea, PND, swelling in lower extremities, dizziness, palpitations, syncope.  GI  heartburn, indigestion, abdominal pain, nausea, vomiting, diarrhea, change in bowel habits, loss of appetite, bloody stools.  Resp: cough, productive: , hemoptysis, dyspnea, chest pain, pleuritic.  Skin: rash or itching or icterus GU: dysuria, change in color of urine, urgency or frequency. flank pain, hematuria  MS: joint pain or swelling. decreased range of motion  Psych: change in mood or affect. depression or anxiety.  Neuro: difficulty with speech, weakness, numbness, ataxia    SUBJECTIVE:   VITAL SIGNS: Temp:  [97.5 F (36.4 C)] 97.5 F (36.4 C) (01/09 1720) Pulse Rate:  [47-78] 69 (01/09 2230) Resp:  [18-20] 20 (01/09 2057) BP: (167-196)/(72-76) 179/72 mmHg (01/09 2230) SpO2:  [96 %-100 %] 98 % (01/09 2230)  PHYSICAL EXAMINATION: General:  Elderly male NAD Neuro:  Alert oreinted HEENT:  [place]/AT, PERRL, no JVD Cardiovascular: IRIR, regular rate Lungs:  Clear Abdomen:  Obese, soft,  non-tender Musculoskeletal:  No acute deformity Skin:  Grossly intact  No results for input(s): NA, K, CL, CO2, BUN, CREATININE, GLUCOSE in the last 168 hours. No results for input(s): HGB, HCT, WBC, PLT in the last 168 hours. Dg Chest 2 View  12/23/2015  CLINICAL DATA:  Swallowed crown at dentist's office EXAM: CHEST  2 VIEW COMPARISON:  April 12, 2014 FINDINGS: There is mild degenerative change in the thoracic spine. Patient is status post aortic valve replacement. There is a metallic foreign body in the periphery of the left main bronchus. This lesion is not causing appreciable obstruction. Lungs are mildly hyperexpanded, stable. There is no appreciable edema or consolidation. Heart size and pulmonary vascularity are normal. Patient is status post coronary artery bypass grafting. No adenopathy. IMPRESSION: Metallic foreign body in the periphery of the left main bronchus, nonobstructing. Lungs mildly hyperexpanded without edema or consolidation. No change in cardiac silhouette. Electronically Signed   By: Lowella Grip III M.D.   On: 12/23/2015 19:05    ASSESSMENT / PLAN:  Foreign body aspiration - Hold coumadin, start heparin gtt per pharmacy - Check PT/INR - Will plan to retreive crown bronchoscopically - NPO after midnight  CAD s/p CABG Chronic dCHF (LVEF 12/2014) Atrial fibrillation (rate controlled) HTN Bioprosthetic AVR - admit to tele - Continue home atorvastatin, doxazosin, lasix, ramipril, zetia  GERD  - Continue home PPI  DM  - CBG and SSI ACHS  Georgann Housekeeper, AGACNP-BC [facility] Pager [phone] or [phone]  12/23/2015 11:27 PM  Attending Note:  82 year old with history of a-fib and bioprosthetic AVR on coumadin for that presents from dentist office after he aspirated his golden crown.  On exam, bibasilar crackles noted.  I reviewed CXR myself and the crown is clearly visible on CXR.  Patient did take his coumadin however and he ate  so will not be able to perform bronchoscopy right now.  Discussed with EDP and PCCM-NP.    Aspiration of foreign body:  - Hold coumdin.  - INR now and in AM.  - Start heparin drip.  - Bronch in AM.  Afib and AVR.  - Start heparin drip  - Tele monitoring.  - Continue home atorvastatin.  - Coxazosin, lasix, ramipril and zetia at home dose.  GERD:  - NPO after midnight.   - PPI.  DM:  - SSI.  - CBGs.  Will likely bronch in AM, will have AM MD determine timing.  Patient seen and examined, agree with above note.  I dictated the care and orders written for this patient under my direction.  Rush Farmer, MD [phone]

## 2015-12-23 NOTE — ED Notes (Signed)
Pt reports going to dentist for permanent crown to be placed, it slipped off while he was lying down with mouth open and pt reports it went down airway. Was sent to ucc and had xray, was sent here bc they reported that the crown is in patient's lung. No resp distress noted.

## 2015-12-24 ENCOUNTER — Encounter (HOSPITAL_COMMUNITY): Payer: Self-pay | Admitting: Radiology

## 2015-12-24 ENCOUNTER — Inpatient Hospital Stay (HOSPITAL_COMMUNITY): Payer: Medicare Other

## 2015-12-24 DIAGNOSIS — E1151 Type 2 diabetes mellitus with diabetic peripheral angiopathy without gangrene: Secondary | ICD-10-CM | POA: Diagnosis not present

## 2015-12-24 DIAGNOSIS — Z954 Presence of other heart-valve replacement: Secondary | ICD-10-CM

## 2015-12-24 DIAGNOSIS — T17900A Unspecified foreign body in respiratory tract, part unspecified causing asphyxiation, initial encounter: Secondary | ICD-10-CM | POA: Diagnosis not present

## 2015-12-24 DIAGNOSIS — I4891 Unspecified atrial fibrillation: Secondary | ICD-10-CM | POA: Diagnosis not present

## 2015-12-24 DIAGNOSIS — I110 Hypertensive heart disease with heart failure: Secondary | ICD-10-CM | POA: Diagnosis not present

## 2015-12-24 DIAGNOSIS — I10 Essential (primary) hypertension: Secondary | ICD-10-CM

## 2015-12-24 DIAGNOSIS — T17590A Other foreign object in bronchus causing asphyxiation, initial encounter: Secondary | ICD-10-CM | POA: Diagnosis not present

## 2015-12-24 DIAGNOSIS — I2510 Atherosclerotic heart disease of native coronary artery without angina pectoris: Secondary | ICD-10-CM | POA: Diagnosis not present

## 2015-12-24 DIAGNOSIS — R918 Other nonspecific abnormal finding of lung field: Secondary | ICD-10-CM | POA: Diagnosis not present

## 2015-12-24 DIAGNOSIS — I5042 Chronic combined systolic (congestive) and diastolic (congestive) heart failure: Secondary | ICD-10-CM | POA: Diagnosis not present

## 2015-12-24 DIAGNOSIS — T17508A Unspecified foreign body in bronchus causing other injury, initial encounter: Secondary | ICD-10-CM | POA: Diagnosis not present

## 2015-12-24 DIAGNOSIS — K219 Gastro-esophageal reflux disease without esophagitis: Secondary | ICD-10-CM | POA: Diagnosis not present

## 2015-12-24 LAB — CBC
HCT: 41.1 % (ref 39.0–52.0)
Hemoglobin: 13.7 g/dL (ref 13.0–17.0)
MCH: 30.1 pg (ref 26.0–34.0)
MCHC: 33.3 g/dL (ref 30.0–36.0)
MCV: 90.3 fL (ref 78.0–100.0)
PLATELETS: 175 10*3/uL (ref 150–400)
RBC: 4.55 MIL/uL (ref 4.22–5.81)
RDW: 13.3 % (ref 11.5–15.5)
WBC: 7.5 10*3/uL (ref 4.0–10.5)

## 2015-12-24 LAB — BASIC METABOLIC PANEL
Anion gap: 9 (ref 5–15)
BUN: 15 mg/dL (ref 6–20)
CHLORIDE: 105 mmol/L (ref 101–111)
CO2: 26 mmol/L (ref 22–32)
CREATININE: 1.04 mg/dL (ref 0.61–1.24)
Calcium: 9 mg/dL (ref 8.9–10.3)
GFR calc Af Amer: >60 mL/min (ref >60)
GLUCOSE: 150 mg/dL — AB (ref 65–99)
Potassium: 3.7 mmol/L (ref 3.5–5.1)
SODIUM: 140 mmol/L (ref 135–145)

## 2015-12-24 LAB — GLUCOSE, CAPILLARY
GLUCOSE-CAPILLARY: 130 mg/dL — AB (ref 65–99)
GLUCOSE-CAPILLARY: 127 mg/dL — AB (ref 65–99)

## 2015-12-24 LAB — HEPARIN LEVEL (UNFRACTIONATED)
HEPARIN UNFRACTIONATED: 0.4 [IU]/mL (ref 0.30–0.70)
HEPARIN UNFRACTIONATED: 0.54 [IU]/mL (ref 0.30–0.70)

## 2015-12-24 LAB — PROTIME-INR
INR: 1.83 — ABNORMAL HIGH (ref 0.00–1.49)
INR: 1.69 — AB (ref 0.00–1.49)
Prothrombin Time: 19.9 seconds — ABNORMAL HIGH (ref 11.6–15.2)
Prothrombin Time: 21.1 seconds — ABNORMAL HIGH (ref 11.6–15.2)

## 2015-12-24 LAB — MRSA PCR SCREENING: MRSA by PCR: NEGATIVE

## 2015-12-24 MED ORDER — DOXAZOSIN MESYLATE 2 MG PO TABS
1.0000 mg | ORAL_TABLET | Freq: Every day | ORAL | Status: DC
  Administered 2015-12-24 (×2): 1 mg via ORAL
  Filled 2015-12-24 (×2): qty 1

## 2015-12-24 MED ORDER — HEPARIN (PORCINE) IN NACL 100-0.45 UNIT/ML-% IJ SOLN
1350.0000 [IU]/h | INTRAMUSCULAR | Status: AC
  Administered 2015-12-24 (×2): 1350 [IU]/h via INTRAVENOUS
  Filled 2015-12-24 (×3): qty 250

## 2015-12-24 NOTE — Progress Notes (Signed)
Paged [place] care doctors for sliding scale insulin for patient. Will continue to monitor.   Nathaniel [name]

## 2015-12-24 NOTE — Progress Notes (Signed)
Name: Nathaniel [name] MRN: [id number] DOB: 09/09/1932    ADMISSION DATE:  12/23/2015 CONSULTATION DATE:  1/9  REFERRING MD :  EDP  CHIEF COMPLAINT:  Foreign body aspiraiton  BRIEF PATIENT DESCRIPTION: 82 year old male presented to ED 1/9 after aspirating permanent crown while at dentist office.   SIGNIFICANT EVENTS  1/9 - Aspiration of Metal Crown & Admit  STUDIES:  CXR PA/LAT 1/9:  Personally reviewed by me. Metallic object partially obstructing left mainstem bronchus with good aeration distally.  SUBJECTIVE:  Patient denies any chest pain or pressure. He denies any difficulty breathing. Does have intermittent coughing.  REVIEW OF SYSTEMS:  No subjective fever, chills, or sweats. No nausea or vomiting.  VITAL SIGNS: Temp:  [97.5 F (36.4 C)-98.1 F (36.7 C)] 98.1 F (36.7 C) (01/10 0510) Pulse Rate:  [47-83] 71 (01/10 0510) Resp:  [18-21] 20 (01/10 0510) BP: (134-196)/(65-102) 134/65 mmHg (01/10 0510) SpO2:  [96 %-100 %] 96 % (01/10 0510) Weight:  [200 lb (90.719 kg)] 200 lb (90.719 kg) (01/10 0052)  PHYSICAL EXAMINATION: General:  Awake. Alert. No acute distress. Preparing to eat lunch.  Integument:  Warm & dry. No rash on exposed skin. HEENT:  Moist mucus membranes. No oral ulcers. No scleral injection or icterus.  Cardiovascular:  Regular rate. No edema. No appreciable JVD.  Pulmonary:  Good aeration & clear to auscultation bilaterally. Symmetric chest wall expansion. No accessory muscle use on room air. Abdomen: Soft. Normal bowel sounds. Nondistended. Grossly nontender. Musculoskeletal:  Normal bulk and tone. Hand grip strength 5/5 bilaterally. No joint deformity or effusion appreciated.  Recent Labs Lab 12/24/15 0300  NA 140  K 3.7  CL 105  CO2 26  BUN 15  CREATININE 1.04  GLUCOSE 150*    Recent Labs Lab 12/24/15 0300  HGB 13.7  HCT 41.1  WBC 7.5  PLT 175   Dg Chest 2 View  12/23/2015  CLINICAL DATA:  Swallowed crown at dentist's office EXAM: CHEST   2 VIEW COMPARISON:  April 12, 2014 FINDINGS: There is mild degenerative change in the thoracic spine. Patient is status post aortic valve replacement. There is a metallic foreign body in the periphery of the left main bronchus. This lesion is not causing appreciable obstruction. Lungs are mildly hyperexpanded, stable. There is no appreciable edema or consolidation. Heart size and pulmonary vascularity are normal. Patient is status post coronary artery bypass grafting. No adenopathy. IMPRESSION: Metallic foreign body in the periphery of the left main bronchus, nonobstructing. Lungs mildly hyperexpanded without edema or consolidation. No change in cardiac silhouette. Electronically Signed   By: Lowella Grip III M.D.   On: 12/23/2015 19:05    ASSESSMENT / PLAN:  82 year old male with bioprosthetic aortic valve presenting after aspirating a metal crown while at his dentist's office. Patient has no signs of respiratory instability. Tolerating Heparin drip for his atrial fibrillation & bioprosthetic valve. With the patient's systemic anticoagulation there will be an increased risk of potential bleeding as I attempt to remove the chart metallic object from his left mainstem bronchus. We did discuss his CODE STATUS and the patient wishes to be full code at this time. I did discuss the risks of the procedure including medication allergy, bleeding, infection, pneumothorax, vocal cord injury, and potentially death. The patient is willing to undergo the procedure. Of note the patient's nurse was contacted by his dental office who reported he had a right lung mass. This was not appreciated by me on chest x-ray  imaging.  1. Foreign Body Aspiration:  Plan to perform bronchoscopy with airway inspection & removal of foreign body 7am 1/11. 2. Atrial Fibrillation:  Heparin drip per pharmacy protocol. Plan to hold heparin at midnight and I have made pharmacy aware of this. Holding home Coumadin. Monitoring on  telemetry. 3. Reported right lung mass: Checking CT scan of the chest without contrast. 4. HTN:  Continuing home Ramipril, Lasix & Doxazosin. 5. CAD S/P CABG:  Continuing home Lipitor & Zetia. 6. DM:  Accu-checks qAC & HS with SSI coverage. 7. Prophyalxis:  Systemic anticoagulation with Heparin drip. Protonix PO daily. 8. Diet:  Heart Healthy/Carb modified diet. NPO after midnight for procedure. 9. Disposition: Patient can likely be discharged home tomorrow after he recovers from bronchoscopy if successful.  Sonia Baller Ashok Cordia, M.D. [facility] Pager:  [phone] After 3pm or if no response, call [phone] 12/24/2015 9:10 AM

## 2015-12-24 NOTE — Progress Notes (Signed)
Dr. Mauro Kaufmann hatcher called to report a R lung mass. Critical care team made aware.   Dr. Phyllis Ginger [phone]

## 2015-12-24 NOTE — Progress Notes (Signed)
ANTICOAGULATION CONSULT NOTE - Initial Consult  Pharmacy Consult for Heparin Indication: atrial fibrillation  No Known Allergies  Patient Measurements:    Vital Signs: Temp: 97.5 F (36.4 C) (01/09 1720) Temp Source: Oral (01/09 1720) BP: 164/73 mmHg (01/10 0000) Pulse Rate: 74 (01/10 0000)  Labs:  Recent Labs  12/23/15 2340  LABPROT 19.9*  INR 1.69*    CrCl cannot be calculated (Unknown ideal weight.).   Medical History: Past Medical History  Diagnosis Date  . CAD (coronary artery disease)   . Atrial fibrillation ([place])   . Diabetes mellitus   . Hyperlipemia   . Hx: UTI (urinary tract infection)   . Gout   . Hypertension   . Cataract   . GERD (gastroesophageal reflux disease)   . Peripheral vascular disease ([place])   . Atrial fibrillation ([place])   . Shortness of breath   . Hernia, abdominal   . CHF (congestive heart failure) ([place])   . Colon polyps     adenomatous  . Diverticulosis   . Internal hemorrhoids     Medications:  Lipitor  Aldactone  Allopurinol  Ocuvite  Cardura  Lasix  Toprol XL  Lovaza  Altace  Januvia  Zetia Warfarin 6 mg daily  Assessment: 82 y.o. male admitted with foreign body aspiration, h/o Afib and subtherapeutic INR, for heparin  Goal of Therapy:  Heparin level 0.3-0.7 units/ml Monitor platelets by anticoagulation protocol: Yes   Plan:  Start heparin 1350 units/hr Check heparin level in 8 hours.   Keivon Garden, Bronson Curb 12/24/2015,12:18 AM

## 2015-12-24 NOTE — Progress Notes (Addendum)
ANTICOAGULATION CONSULT NOTE - Follow Up Consult  Pharmacy Consult for heparin  Indication: atrial fibrillation  No Known Allergies  Patient Measurements: Weight: 200 lb (90.719 kg)  Vital Signs:    Labs:  Recent Labs  12/23/15 2340 12/24/15 0300 12/24/15 0940 12/24/15 1913  HGB  --  13.7  --   --   HCT  --  41.1  --   --   PLT  --  175  --   --   LABPROT 19.9* 21.1*  --   --   INR 1.69* 1.83*  --   --   HEPARINUNFRC  --   --  0.40 0.54  CREATININE  --  1.04  --   --     Estimated Creatinine Clearance: 63.1 mL/min (by C-G formula based on Cr of 1.04).   Medications:  Scheduled:  . atorvastatin  20 mg Oral Daily  . doxazosin  1 mg Oral QHS  . doxazosin  2 mg Oral Daily  . ezetimibe  10 mg Oral Daily  . pantoprazole  40 mg Oral Daily  . ramipril  5 mg Oral Daily  . sodium chloride  3 mL Intravenous Q12H    Assessment: 82 yo male here with foreign body aspiration. He is noted with history of afib on comadin PTA. Currently on IV heparin at 1350 units/hr. HL remains therapeutic at 0.54. RN reports no s/s of bleeding   Goal of Therapy:  Heparin level 0.3-0.7 units/ml Monitor platelets by anticoagulation protocol: Yes   Plan:  -Continue at current heparin rate  -Heparin infusion to stop at midnight tonight for procedure tomorrow -F/u resuming heparin after procedure tomorrow  Albertina Parr, PharmD., BCPS Clinical Pharmacist Pager [phone]

## 2015-12-24 NOTE — Progress Notes (Signed)
ANTICOAGULATION CONSULT NOTE - Follow Up Consult  Pharmacy Consult for heparin  Indication: atrial fibrillation  No Known Allergies  Patient Measurements: Weight: 200 lb (90.719 kg)  Vital Signs: Temp: 98.1 F (36.7 C) (01/10 0510) Temp Source: Oral (01/10 0510) BP: 134/65 mmHg (01/10 0510) Pulse Rate: 71 (01/10 0510)  Labs:  Recent Labs  12/23/15 2340 12/24/15 0300 12/24/15 0940  HGB  --  13.7  --   HCT  --  41.1  --   PLT  --  175  --   LABPROT 19.9* 21.1*  --   INR 1.69* 1.83*  --   HEPARINUNFRC  --   --  0.40  CREATININE  --  1.04  --     Estimated Creatinine Clearance: 63.1 mL/min (by C-G formula based on Cr of 1.04).   Medications:  Scheduled:  . atorvastatin  20 mg Oral Daily  . doxazosin  1 mg Oral QHS  . doxazosin  2 mg Oral Daily  . ezetimibe  10 mg Oral Daily  . pantoprazole  40 mg Oral Daily  . ramipril  5 mg Oral Daily  . sodium chloride  3 mL Intravenous Q12H    Assessment: 82 yo male here with foreign body aspiration. He is noted with history of afib on comadin PTA. Pharmacy has been consulted to dose heparin while coumadin is on hold. -INR= 1.83, HL= 0.4  Goal of Therapy:  Heparin level 0.3-0.7 units/ml Monitor platelets by anticoagulation protocol: Yes   Plan:  -No heparin changes needed -Will confirm a heparin level later today  Hildred Laser, Pharm D 12/24/2015 11:13 AM

## 2015-12-24 NOTE — ED Provider Notes (Signed)
CSN: [id number]     Arrival date & time 12/23/15  1700 History   First MD Initiated Contact with Patient 12/23/15 2048     Chief Complaint  Patient presents with  . Swallowed Foreign Body     (Consider location/radiation/quality/duration/timing/severity/associated sxs/prior Treatment) HPI Comments: Dental crown fell into mouth and was aspirated at dentist's office Coughing has improved since incident occurred however still present with inspiration No sob/cp   Patient is a 82 y.o. male presenting with foreign body swallowed.  Swallowed Foreign Body This is a new problem. The current episode started 1 to 2 hours ago. The problem occurs constantly. The problem has not changed since onset.Pertinent negatives include no chest pain, no abdominal pain, no headaches and no shortness of breath. Nothing aggravates the symptoms. Nothing relieves the symptoms. He has tried nothing for the symptoms. The treatment provided no relief.    Past Medical History  Diagnosis Date  . CAD (coronary artery disease)   . Atrial fibrillation ([place])   . Diabetes mellitus   . Hyperlipemia   . Hx: UTI (urinary tract infection)   . Gout   . Hypertension   . Cataract   . GERD (gastroesophageal reflux disease)   . Peripheral vascular disease ([place])   . Atrial fibrillation ([place])   . Shortness of breath   . Hernia, abdominal   . CHF (congestive heart failure) ([place])   . Colon polyps     adenomatous  . Diverticulosis   . Internal hemorrhoids    Past Surgical History  Procedure Laterality Date  . Appendectomy    . Cardiac valve surgery    . Angioplasty      Unsure if he had stents put in   . Coronary artery bypass graft    . Tonsillectomy    . Eye surgery Bilateral     cataract surgery  . Hernia repair      2 operations  . Abdominal aortic endovascular stent graft N/A 04/13/2014    Procedure: ABDOMINAL AORTIC ENDOVASCULAR STENT GRAFT;  Surgeon: Serafina Mitchell, MD;  Location: [facility];  Service: Vascular;   Laterality: N/A;  . Cardioversion N/A 06/06/2014    Procedure: CARDIOVERSION;  Surgeon: Larey Dresser, MD;  Location: [facility] ENDOSCOPY;  Service: Cardiovascular;  Laterality: N/A;  . Cardioversion N/A 08/21/2014    Procedure: CARDIOVERSION;  Surgeon: Larey Dresser, MD;  Location: [facility];  Service: Cardiovascular;  Laterality: N/A;  . Left heart catheterization with coronary/graft angiogram N/A 07/10/2014    Procedure: LEFT HEART CATHETERIZATION WITH Beatrix Fetters;  Surgeon: Larey Dresser, MD;  Location: [facility] CATH LAB;  Service: Cardiovascular;  Laterality: N/A;   Family History  Problem Relation Age of Onset  . Colon cancer Neg Hx   . Heart disease Father     before age 38  . AAA (abdominal aortic aneurysm) Father   . Heart disease Mother    Social History  Substance Use Topics  . Smoking status: Former Smoker -- 20 years    Types: Cigarettes  . Smokeless tobacco: Never Used  . Alcohol Use: 6.0 oz/week    10 Glasses of wine per week     Comment: 1-2 drinks daily     Review of Systems  Constitutional: Negative for fever.  HENT: Negative for sore throat.   Eyes: Negative for visual disturbance.  Respiratory: Positive for cough. Negative for shortness of breath.   Cardiovascular: Negative for chest pain.  Gastrointestinal: Negative for abdominal pain.  Genitourinary: Negative for  difficulty urinating.  Musculoskeletal: Negative for back pain and neck stiffness.  Skin: Negative for rash.  Neurological: Negative for syncope and headaches.      Allergies  Review of patient's allergies indicates no known allergies.  Home Medications   Prior to Admission medications   Medication Sig Start Date End Date Taking? Authorizing Provider  allopurinol (ZYLOPRIM) 100 MG tablet Take 100 mg by mouth daily.     Yes Historical Provider, MD  atorvastatin (LIPITOR) 20 MG tablet Take 1 tablet (20 mg total) by mouth daily. 05/14/15  Yes Larey Dresser, MD  beta carotene  w/minerals (OCUVITE) tablet Take 1 tablet by mouth daily.   Yes Historical Provider, MD  Coenzyme Q10 (CO Q 10) 100 MG CAPS Take 100 mg by mouth daily.    Yes Historical Provider, MD  doxazosin (CARDURA) 1 MG tablet Take 1-2 mg by mouth 2 (two) times daily. Take 2 tablets in the morning and take 1 tablet in the evening 03/09/11  Yes Historical Provider, MD  furosemide (LASIX) 40 MG tablet Take 40 mg by mouth every other day.   Yes Historical Provider, MD  glucosamine-chondroitin 500-400 MG tablet Take 1 tablet by mouth daily.   Yes Historical Provider, MD  metoprolol succinate (TOPROL-XL) 25 MG 24 hr tablet Take 25 mg by mouth daily as needed. Take one tablet by mouth every other day because if makes me feel sick per patient   Yes Historical Provider, MD  Omega-3 Fatty Acids (FISH OIL) 1000 MG CAPS Take 1,000-2,000 mg by mouth 2 (two) times daily. Take 2000 mg in the morning and 1000 mg in the evening   Yes Historical Provider, MD  ramipril (ALTACE) 5 MG capsule Take 5 mg by mouth daily.   Yes Historical Provider, MD  sitaGLIPtin (JANUVIA) 100 MG tablet Take 100 mg by mouth daily.   Yes Historical Provider, MD  spironolactone (ALDACTONE) 25 MG tablet TAKE ONE TABLET EACH DAY 10/22/15  Yes Larey Dresser, MD  warfarin (COUMADIN) 3 MG tablet Take 6 mg by mouth daily.   Yes Historical Provider, MD  ZETIA 10 MG tablet Take 10 mg by mouth daily.  03/03/11  Yes Historical Provider, MD  atorvastatin (LIPITOR) 10 MG tablet Take 1 tablet (10 mg total) by mouth daily. Patient not taking: Reported on 12/23/2015 05/14/15   Larey Dresser, MD  furosemide (LASIX) 40 MG tablet '40mg'$  alternating with '20mg'$  daily in the morning Patient not taking: Reported on 12/23/2015 05/14/15   Larey Dresser, MD  omeprazole (PRILOSEC) 20 MG capsule Take 1 capsule (20 mg total) by mouth daily. Patient not taking: Reported on 12/23/2015 09/18/15   Irene Shipper, MD  warfarin (COUMADIN) 3 MG tablet USE AS DIRECTED BY ANTICOAGULATION  CLINIC Patient not taking: Reported on 12/23/2015 11/19/15   Larey Dresser, MD   BP 152/102 mmHg  Pulse 83  Temp(Src) 98.1 F (36.7 C) (Oral)  Resp 20  Wt 200 lb (90.719 kg)  SpO2 99% Physical Exam  Constitutional: He is oriented to person, place, and time. He appears well-developed and well-nourished. No distress.  HENT:  Head: Normocephalic and atraumatic.  Eyes: Conjunctivae and EOM are normal.  Neck: Normal range of motion.  Cardiovascular: Normal rate, regular rhythm, normal heart sounds and intact distal pulses.  Exam reveals no gallop and no friction rub.   No murmur heard. Pulmonary/Chest: Effort normal and breath sounds normal. No respiratory distress. He has no wheezes. He has no rales.  Frequent coughing  with inspiration  Abdominal: Soft. He exhibits no distension. There is no tenderness. There is no guarding.  Musculoskeletal: He exhibits no edema.  Neurological: He is alert and oriented to person, place, and time.  Skin: Skin is warm and dry. He is not diaphoretic.  Nursing note and vitals reviewed.   ED Course  Procedures (including critical care time) Labs Review Labs Reviewed  PROTIME-INR - Abnormal; Notable for the following:    Prothrombin Time 19.9 (*)    INR 1.69 (*)    All other components within normal limits  BASIC METABOLIC PANEL - Abnormal; Notable for the following:    Glucose, Bld 150 (*)    All other components within normal limits  PROTIME-INR - Abnormal; Notable for the following:    Prothrombin Time 21.1 (*)    INR 1.83 (*)    All other components within normal limits  MRSA PCR SCREENING  CBC  HEPARIN LEVEL (UNFRACTIONATED)    Imaging Review Dg Chest 2 View  12/23/2015  CLINICAL DATA:  Swallowed crown at dentist's office EXAM: CHEST  2 VIEW COMPARISON:  April 12, 2014 FINDINGS: There is mild degenerative change in the thoracic spine. Patient is status post aortic valve replacement. There is a metallic foreign body in the periphery of the  left main bronchus. This lesion is not causing appreciable obstruction. Lungs are mildly hyperexpanded, stable. There is no appreciable edema or consolidation. Heart size and pulmonary vascularity are normal. Patient is status post coronary artery bypass grafting. No adenopathy. IMPRESSION: Metallic foreign body in the periphery of the left main bronchus, nonobstructing. Lungs mildly hyperexpanded without edema or consolidation. No change in cardiac silhouette. Electronically Signed   By: Lowella Grip III M.D.   On: 12/23/2015 19:05   I have personally reviewed and evaluated these images and lab results as part of my medical decision-making.   EKG Interpretation None      MDM   Final diagnoses:  Foreign body aspiration, initial encounter   82 year old male with a history of cardiac artery disease, AAA, hypertension, hyperlipidemia, atrial fibrillation, chronic systolic and diastolic heart failure presents with concern of aspiration of foreign body while at dentist. Patient reports his crown fell down his throat was aspirated. Chest x-ray confirms metallic foreign body left main bronchus. Patient is in no acute distress, no hypoxia, no tachypnea. Call pulmonology for consult, and pulmonology admitted the patient for further evaluation and bronchoscopy.   Gareth Morgan, MD 12/24/15 [phone]

## 2015-12-25 ENCOUNTER — Encounter (HOSPITAL_COMMUNITY)
Admission: EM | Disposition: A | Discharge status: Home / Self Care | Payer: Self-pay | Source: Home / Self Care | Attending: Pulmonary Disease

## 2015-12-25 ENCOUNTER — Inpatient Hospital Stay (HOSPITAL_COMMUNITY): Payer: Medicare Other

## 2015-12-25 ENCOUNTER — Other Ambulatory Visit: Payer: Self-pay | Admitting: Acute Care

## 2015-12-25 DIAGNOSIS — I5042 Chronic combined systolic (congestive) and diastolic (congestive) heart failure: Secondary | ICD-10-CM | POA: Diagnosis not present

## 2015-12-25 DIAGNOSIS — I2510 Atherosclerotic heart disease of native coronary artery without angina pectoris: Secondary | ICD-10-CM | POA: Diagnosis not present

## 2015-12-25 DIAGNOSIS — T17590A Other foreign object in bronchus causing asphyxiation, initial encounter: Secondary | ICD-10-CM | POA: Diagnosis not present

## 2015-12-25 DIAGNOSIS — T17908A Unspecified foreign body in respiratory tract, part unspecified causing other injury, initial encounter: Secondary | ICD-10-CM | POA: Insufficient documentation

## 2015-12-25 DIAGNOSIS — R911 Solitary pulmonary nodule: Secondary | ICD-10-CM | POA: Insufficient documentation

## 2015-12-25 DIAGNOSIS — E1151 Type 2 diabetes mellitus with diabetic peripheral angiopathy without gangrene: Secondary | ICD-10-CM | POA: Diagnosis not present

## 2015-12-25 DIAGNOSIS — I4891 Unspecified atrial fibrillation: Secondary | ICD-10-CM | POA: Diagnosis not present

## 2015-12-25 DIAGNOSIS — I110 Hypertensive heart disease with heart failure: Secondary | ICD-10-CM | POA: Diagnosis not present

## 2015-12-25 DIAGNOSIS — K219 Gastro-esophageal reflux disease without esophagitis: Secondary | ICD-10-CM | POA: Diagnosis not present

## 2015-12-25 DIAGNOSIS — IMO0001 Reserved for inherently not codable concepts without codable children: Secondary | ICD-10-CM | POA: Insufficient documentation

## 2015-12-25 DIAGNOSIS — T17900A Unspecified foreign body in respiratory tract, part unspecified causing asphyxiation, initial encounter: Secondary | ICD-10-CM | POA: Insufficient documentation

## 2015-12-25 HISTORY — PX: VIDEO BRONCHOSCOPY: SHX5072

## 2015-12-25 LAB — CBC
HCT: 40.2 % (ref 39.0–52.0)
HEMOGLOBIN: 13.4 g/dL (ref 13.0–17.0)
MCH: 30.5 pg (ref 26.0–34.0)
MCHC: 33.3 g/dL (ref 30.0–36.0)
MCV: 91.6 fL (ref 78.0–100.0)
Platelets: 178 10*3/uL (ref 150–400)
RBC: 4.39 MIL/uL (ref 4.22–5.81)
RDW: 13.4 % (ref 11.5–15.5)
WBC: 7.2 10*3/uL (ref 4.0–10.5)

## 2015-12-25 LAB — GLUCOSE, CAPILLARY: Glucose-Capillary: 102 mg/dL — ABNORMAL HIGH (ref 65–99)

## 2015-12-25 SURGERY — VIDEO BRONCHOSCOPY WITHOUT FLUORO
Anesthesia: Moderate Sedation | Laterality: Bilateral

## 2015-12-25 MED ORDER — FENTANYL CITRATE (PF) 100 MCG/2ML IJ SOLN
INTRAMUSCULAR | Status: DC | PRN
  Administered 2015-12-25 (×5): 25 ug via INTRAVENOUS

## 2015-12-25 MED ORDER — MIDAZOLAM HCL 10 MG/2ML IJ SOLN
INTRAMUSCULAR | Status: DC | PRN
  Administered 2015-12-25 (×4): 1 mg via INTRAVENOUS

## 2015-12-25 MED ORDER — MIDAZOLAM HCL 5 MG/ML IJ SOLN
INTRAMUSCULAR | Status: AC
  Filled 2015-12-25: qty 2

## 2015-12-25 MED ORDER — FENTANYL CITRATE (PF) 100 MCG/2ML IJ SOLN
INTRAMUSCULAR | Status: AC
  Filled 2015-12-25: qty 4

## 2015-12-25 MED ORDER — SODIUM CHLORIDE 0.9 % IV SOLN
INTRAVENOUS | Status: DC
  Administered 2015-12-25: 07:00:00 via INTRAVENOUS

## 2015-12-25 NOTE — Progress Notes (Signed)
Pt transported to endo. CHG and swab completed. Pt belongings secured in 2W12. Report called to endo. Cardiac monitor removed.   Raliegh Ip RN

## 2015-12-25 NOTE — Op Note (Signed)
Video Bronchoscopy Procedure Note  Pre-Procedure Diagnoses: 1.  Foreign Body Aspiration of Metal Tooth Crown  Procedures Performed: 1. Bronchoscopy with Airway Inspection 2. Foreign Body Removal from Bronchus Intermedius  Consent:  Informed consent was obtained from the patient after discussing the risks and benefits of the procedure including bleeding, infection, pneumothorax, medication allergy, vocal chord injury, and potentially death.  Conscious Sedation:   Time Start:  7:14 am Time Stop:  7:35 am  Medications Administered During Conscious Sedation: 1. Lidocaine 1% gargle 10cc 2. Lidocaine 1% 14cc via bronchoscope 3. Versed 4 mg IV 4. Fentanyl 125 mcg IV  Pre-Procedure Physical Exam: General:  No acute distress. Awake. Alert. ASA Class 1. HEENT:  Moist mucus membranes. No oral ulcers. Mallampati Class 3. Cardiovascular:  Regular rate. No edema. No appreciable JVD. Pulmonary:  Clear to auscultation with good aeration bilaterally. Normal work of breathing on nasal cannula oxygen. Abdomen:  Soft. Nontender. Nondistended. Normal bowel sounds. Musculoskeletal:  Normal bulk and tone. Normal neck flexion & extension. Neurological:  Oriented to person, place, and time. Moving all 4 extremities equally.  Description of Procedure: Patient was brought back to the endoscopy procedure room.  A time out was performed to identify the correct patient and procedure.  Lidocaine gargle was performed.  Patient was laid recumbent and conscious sedation was administered by the respiratory therapist.  Bite block was inserted and towel was placed over the patient's eyes.  Flexible bronchoscope was then inserted into the posterior pharynx until vocal chords were in full view. There was no abnormality of the vocal chords or arytenoids.  A total of 6cc of Lidocaine was used to anesthetize the vocal chords.  The bronchoscope was then inserted between the vocal chords with ease into the proximal trachea.   Lidocaine was then used to anesthetize the patient's proximal airways.  An airway inspeciton was performed finding patient's metal tooth crown obstructing 75% of the bronchus intermedius with minimal blood-tinged secretions surrounding it. A large forceps was used to reposition the patient's tooth. Basket was then inserted and used to snare the metal tooth crown then bronchoscope and crown were removed from the patient's airway. Bronchoscope was then reinserted into the posterior pharynx and vocal cords were visualized without any trauma or abnormality.  Scope was then inserted between the vocal cords and into the trachea. Visual inspection of the airways was performed revealing minimal mucosal erythema within the bronchus intermedius and the distal left mainstem bronchus. Hemostasis was directly visualized.  The remaining secretions were suctioned from the patient's airways and the bronchoscope was removed.  The bite block was removed and the patient was returned to the upright position.  Blood Loss:  Less than 5cc.  Complications:  None.  Post Procedure Instructions/Plan: 1. NPO for one hour post procedure until patient can complete bedside swallow evaluation. 2. Plan for discharge this morning resuming home medications if patient remains stable post procedure. 3. Plan for followup of right upper lobe nodule with CT Scan of the Chest w/o in 3 months.

## 2015-12-25 NOTE — Discharge Summary (Signed)
Physician Discharge Summary       Patient ID: Nathaniel [name] MRN: [id number] DOB/AGE: [birth year] 82 y.o.  Admit date: 12/23/2015 Discharge date: 12/25/2015  Discharge Diagnoses:   Foreign body aspiration  Right upper Lobe Pulmonary Nodule  Atrial fibrillation  HTN  CAD DM  Detailed Hospital Course:   82 year old male with bioprosthetic aortic valve presenting after aspirating a metal crown while at his dentist's office. Was brought to the endoscopy lab, where he underwent 1. Bronchoscopy with Airway Inspection and 2. Foreign Body Removal from Bronchus Intermedius. The patient tolerated this well. Was recovered and then observed post-operatively for stability. He was deemed ready for d/c later that morning and was discharged to home with the following plan as described below.    Discharge Plan by active problems  Foreign body aspiration  Plan No limitations Discussed expected possibility of streaky hemoptysis over next 24hrs. Instructed that should this INCREASE, he should contact Coumadin clinic for INR check & notify our office.  He has been instructed to resume coumadin as before  Right upper Lobe Pulmonary Nodule  Plan Have ordered non contrast CT chest in 3 months. -->results will be called to Dr Ashok Cordia   Atrial fibrillation; HTN; CAD & DM Plan No change to his baseline regimen. Resume all prior care    Significant Hospital tests/ studies  Consults   Discharge Exam: BP 123/46 mmHg  Pulse 58  Temp(Src) 97.7 F (36.5 C) (Oral)  Resp 19  Ht '5\' 10"'$  (1.778 m)  Wt 90.719 kg (200 lb)  BMI 28.70 kg/m2  SpO2 93% Room air  General: No acute distress. Awake. Alert. ASA Class 1. HEENT: Moist mucus membranes. No oral ulcers. Mallampati Class 3. Cardiovascular: Regular rate. No edema. No appreciable JVD. Pulmonary: Clear to auscultation with good aeration bilaterally. Normal work of breathing on nasal cannula oxygen. Abdomen: Soft. Nontender. Nondistended. Normal bowel  sounds. Musculoskeletal: Normal bulk and tone. Normal neck flexion & extension. Neurological: Oriented to person, place, and time. Moving all 4 extremities equally. Labs at discharge Lab Results  Component Value Date   CREATININE 1.04 12/24/2015   BUN 15 12/24/2015   NA 140 12/24/2015   K 3.7 12/24/2015   CL 105 12/24/2015   CO2 26 12/24/2015   Lab Results  Component Value Date   WBC 7.2 12/25/2015   HGB 13.4 12/25/2015   HCT 40.2 12/25/2015   MCV 91.6 12/25/2015   PLT 178 12/25/2015   Lab Results  Component Value Date   ALT 14 05/28/2015   AST 19 05/28/2015   ALKPHOS 46 05/28/2015   BILITOT 0.7 05/28/2015   Lab Results  Component Value Date   INR 1.83* 12/24/2015   INR 1.69* 12/23/2015   INR 2.0 11/28/2015    Current radiology studies Dg Chest 2 View  12/23/2015  CLINICAL DATA:  Swallowed crown at dentist's office EXAM: CHEST  2 VIEW COMPARISON:  April 12, 2014 FINDINGS: There is mild degenerative change in the thoracic spine. Patient is status post aortic valve replacement. There is a metallic foreign body in the periphery of the left main bronchus. This lesion is not causing appreciable obstruction. Lungs are mildly hyperexpanded, stable. There is no appreciable edema or consolidation. Heart size and pulmonary vascularity are normal. Patient is status post coronary artery bypass grafting. No adenopathy. IMPRESSION: Metallic foreign body in the periphery of the left main bronchus, nonobstructing. Lungs mildly hyperexpanded without edema or consolidation. No change in cardiac silhouette. Electronically Signed   By:  Lowella Grip III M.D.   On: 12/23/2015 19:05   Ct Chest Wo Contrast  12/25/2015  CLINICAL DATA:  82 year old male concern for foreign body aspiration. EXAM: CT CHEST WITHOUT CONTRAST TECHNIQUE: Multidetector CT imaging of the chest was performed following the standard protocol without IV contrast. COMPARISON:  Radiograph dated 12/23/2015 FINDINGS: Evaluation  of this exam is limited in the absence of intravenous contrast. The lungs are clear. A 6 mm right apical nodular density (series 201 image 13). This may be a focal area of scarring or postinflammatory change. Follow-up as per Fleischner Society criteria recommended. There is no pleural effusion or pneumothorax. A 12 mm metallic density is noted in the bronchus intermedius at the bifurcation to the right middle and right lower lobe bronchi compatible with suspected aspirated foreign object. The central airways however remain patent. This object is seen in the left mainstem bronchus on the prior radiograph. There is no cardiomegaly or pericardial effusion. There is dense coronary vascular calcification. Calcified mitral annulus. Mechanical aortic valve and CABG vascular clips noted. There is no hilar or mediastinal adenopathy. The esophagus is grossly unremarkable. No thyroid nodules identified. There is no axillary adenopathy. The chest wall soft tissues appear unremarkable. Median sternotomy wires. There is degenerative changes of the thoracic spine. No acute fracture. The visualized upper abdomen is unremarkable. IMPRESSION: Metallic density in the bronchus intermedius compatible with aspirated foreign object. These results were called by telephone at the time of interpretation on 12/25/2015 at 3:33 am to patient's nurse Ronne Binning who verbally acknowledged these results. Electronically Signed   By: Anner Crete M.D.   On: 12/25/2015 03:33    Disposition:  82-Discharged to home/self-care with a planned acute care hospital inpt readmission      Discharge Instructions    Diet - low sodium heart healthy    Complete by:  As directed      Discharge instructions    Complete by:  As directed   You will be contacted w/ follow up CT schedule     Increase activity slowly    Complete by:  As directed             Medication List    TAKE these medications        allopurinol 100 MG tablet  Commonly known  as:  ZYLOPRIM  Take 100 mg by mouth daily.     atorvastatin 10 MG tablet  Commonly known as:  LIPITOR  Take 1 tablet (10 mg total) by mouth daily.     atorvastatin 20 MG tablet  Commonly known as:  LIPITOR  Take 1 tablet (20 mg total) by mouth daily.     beta carotene w/minerals tablet  Take 1 tablet by mouth daily.     Co Q 10 100 MG Caps  Take 100 mg by mouth daily.     doxazosin 1 MG tablet  Commonly known as:  CARDURA  Take 1-2 mg by mouth 2 (two) times daily. Take 2 tablets in the morning and take 1 tablet in the evening     Fish Oil 1000 MG Caps  Take 1,000-2,000 mg by mouth 2 (two) times daily. Take 2000 mg in the morning and 1000 mg in the evening     furosemide 40 MG tablet  Commonly known as:  LASIX  '40mg'$  alternating with '20mg'$  daily in the morning     glucosamine-chondroitin 500-400 MG tablet  Take 1 tablet by mouth daily.     metoprolol succinate 25 MG  24 hr tablet  Commonly known as:  TOPROL-XL  Take 25 mg by mouth daily as needed. Take one tablet by mouth every other day because if makes me feel sick per patient     omeprazole 20 MG capsule  Commonly known as:  PRILOSEC  Take 1 capsule (20 mg total) by mouth daily.     ramipril 5 MG capsule  Commonly known as:  ALTACE  Take 5 mg by mouth daily.     sitaGLIPtin 100 MG tablet  Commonly known as:  JANUVIA  Take 100 mg by mouth daily.     spironolactone 25 MG tablet  Commonly known as:  ALDACTONE  TAKE ONE TABLET EACH DAY     warfarin 3 MG tablet  Commonly known as:  COUMADIN  Take 6 mg by mouth daily.     warfarin 3 MG tablet  Commonly known as:  COUMADIN  USE AS DIRECTED BY ANTICOAGULATION CLINIC     ZETIA 10 MG tablet  Generic drug:  ezetimibe  Take 10 mg by mouth daily.       Follow-up Information    Follow up with Tera Partridge, MD On 04/07/2016.   Specialty:  Pulmonary Disease   Why:  at 9am; we can review your CT results then & plan for future monitoring. Also we will contact you  re: timing of CT scan   Contact information:   [address] [place] [address] [phone]       Discharged Condition: good  Physician Statement:   The Patient was personally examined, the discharge assessment and plan has been personally reviewed and I agree with ACNP Kylo Gavin's assessment and plan. > 30 minutes of time have been dedicated to discharge assessment, planning and discharge instructions.   Signed: Clementeen Graham 12/25/2015, 9:44 AM

## 2015-12-25 NOTE — Progress Notes (Addendum)
Video bronchoscopy done to retract crown of tooth gone into lungs. All vitals good thru out procedure. Report called to RN Grant Fontana. Crown was retrived form lungs.

## 2015-12-25 NOTE — Progress Notes (Signed)
Radiology notified of foreign body now in R bronchus intermedius. Object has shifted from prior xray. Informed that bronchoscopy is scheduled for 7 am. Will continue to monitor closely.  Raliegh Ip RN

## 2015-12-26 ENCOUNTER — Encounter (HOSPITAL_COMMUNITY): Payer: Self-pay | Admitting: Pulmonary Disease

## 2016-01-09 ENCOUNTER — Ambulatory Visit (INDEPENDENT_AMBULATORY_CARE_PROVIDER_SITE_OTHER): Payer: Medicare Other | Admitting: *Deleted

## 2016-01-09 DIAGNOSIS — Z5181 Encounter for therapeutic drug level monitoring: Secondary | ICD-10-CM | POA: Diagnosis not present

## 2016-01-09 DIAGNOSIS — I4891 Unspecified atrial fibrillation: Secondary | ICD-10-CM

## 2016-01-09 LAB — POCT INR: INR: 2.1

## 2016-01-30 DIAGNOSIS — H53432 Sector or arcuate defects, left eye: Secondary | ICD-10-CM | POA: Diagnosis not present

## 2016-02-04 DIAGNOSIS — E782 Mixed hyperlipidemia: Secondary | ICD-10-CM | POA: Diagnosis not present

## 2016-02-04 DIAGNOSIS — E1165 Type 2 diabetes mellitus with hyperglycemia: Secondary | ICD-10-CM | POA: Diagnosis not present

## 2016-02-04 DIAGNOSIS — E1159 Type 2 diabetes mellitus with other circulatory complications: Secondary | ICD-10-CM | POA: Diagnosis not present

## 2016-02-05 ENCOUNTER — Encounter: Payer: Self-pay | Admitting: Surgery

## 2016-02-07 DIAGNOSIS — N183 Chronic kidney disease, stage 3 (moderate): Secondary | ICD-10-CM | POA: Diagnosis not present

## 2016-02-07 DIAGNOSIS — M109 Gout, unspecified: Secondary | ICD-10-CM | POA: Diagnosis not present

## 2016-02-07 DIAGNOSIS — I70203 Unspecified atherosclerosis of native arteries of extremities, bilateral legs: Secondary | ICD-10-CM | POA: Diagnosis not present

## 2016-02-07 DIAGNOSIS — E782 Mixed hyperlipidemia: Secondary | ICD-10-CM | POA: Diagnosis not present

## 2016-02-07 DIAGNOSIS — I10 Essential (primary) hypertension: Secondary | ICD-10-CM | POA: Diagnosis not present

## 2016-02-07 DIAGNOSIS — E1159 Type 2 diabetes mellitus with other circulatory complications: Secondary | ICD-10-CM | POA: Diagnosis not present

## 2016-02-07 DIAGNOSIS — E1165 Type 2 diabetes mellitus with hyperglycemia: Secondary | ICD-10-CM | POA: Diagnosis not present

## 2016-02-07 DIAGNOSIS — I2510 Atherosclerotic heart disease of native coronary artery without angina pectoris: Secondary | ICD-10-CM | POA: Diagnosis not present

## 2016-02-10 ENCOUNTER — Encounter: Payer: Self-pay | Admitting: Surgery

## 2016-02-10 ENCOUNTER — Ambulatory Visit (INDEPENDENT_AMBULATORY_CARE_PROVIDER_SITE_OTHER): Payer: Medicare Other | Admitting: Surgery

## 2016-02-10 ENCOUNTER — Ambulatory Visit (HOSPITAL_COMMUNITY)
Admission: RE | Admit: 2016-02-10 | Discharge: 2016-02-10 | Disposition: A | Discharge status: Home / Self Care | Payer: Medicare Other | Source: Ambulatory Visit | Attending: Surgery | Admitting: Surgery

## 2016-02-10 VITALS — BP 176/78 | HR 69 | Ht 70.0 in | Wt 201.0 lb

## 2016-02-10 DIAGNOSIS — I714 Abdominal aortic aneurysm, without rupture, unspecified: Secondary | ICD-10-CM

## 2016-02-10 DIAGNOSIS — E119 Type 2 diabetes mellitus without complications: Secondary | ICD-10-CM | POA: Insufficient documentation

## 2016-02-10 DIAGNOSIS — Z48812 Encounter for surgical aftercare following surgery on the circulatory system: Secondary | ICD-10-CM

## 2016-02-10 DIAGNOSIS — E785 Hyperlipidemia, unspecified: Secondary | ICD-10-CM | POA: Insufficient documentation

## 2016-02-10 DIAGNOSIS — Z95828 Presence of other vascular implants and grafts: Secondary | ICD-10-CM | POA: Diagnosis not present

## 2016-02-10 DIAGNOSIS — I110 Hypertensive heart disease with heart failure: Secondary | ICD-10-CM | POA: Diagnosis not present

## 2016-02-10 DIAGNOSIS — I509 Heart failure, unspecified: Secondary | ICD-10-CM | POA: Diagnosis not present

## 2016-02-10 NOTE — Progress Notes (Signed)
Patient name: Nathaniel [name] MRN: [id number] DOB: 1932-11-17 Sex: male     Chief Complaint  Patient presents with  . Re-evaluation    1 year f/u AAA    HISTORY OF PRESENT ILLNESS: He is status post endovascular repair of abdominal aortic aneurysm on 04/13/2014. His postoperative course was complicated by atrial fibrillation. He was discharged on Coumadin. His follow-up CT angiogram shows that the stent graft is in good position and there is no evidence of endoleak.  He underwent cardioversion and converted to normal sinus rhythm.  Other than a recent virus, he has not had any difficulties.  Past Medical History  Diagnosis Date  . CAD (coronary artery disease)   . Atrial fibrillation ([place])   . Diabetes mellitus   . Hyperlipemia   . Hx: UTI (urinary tract infection)   . Gout   . Hypertension   . Cataract   . GERD (gastroesophageal reflux disease)   . Peripheral vascular disease ([place])   . Atrial fibrillation ([place])   . Shortness of breath   . Hernia, abdominal   . CHF (congestive heart failure) ([place])   . Colon polyps     adenomatous  . Diverticulosis   . Internal hemorrhoids     Past Surgical History  Procedure Laterality Date  . Appendectomy    . Cardiac valve surgery    . Angioplasty      Unsure if he had stents put in   . Coronary artery bypass graft    . Tonsillectomy    . Eye surgery Bilateral     cataract surgery  . Hernia repair      2 operations  . Abdominal aortic endovascular stent graft N/A 04/13/2014    Procedure: ABDOMINAL AORTIC ENDOVASCULAR STENT GRAFT;  Surgeon: Serafina Mitchell, MD;  Location: [place];  Service: Vascular;  Laterality: N/A;  . Cardioversion N/A 06/06/2014    Procedure: CARDIOVERSION;  Surgeon: Larey Dresser, MD;  Location: [facility] ENDOSCOPY;  Service: Cardiovascular;  Laterality: N/A;  . Cardioversion N/A 08/21/2014    Procedure: CARDIOVERSION;  Surgeon: Larey Dresser, MD;  Location: [place];  Service: Cardiovascular;  Laterality:  N/A;  . Left heart catheterization with coronary/graft angiogram N/A 07/10/2014    Procedure: LEFT HEART CATHETERIZATION WITH Beatrix Fetters;  Surgeon: Larey Dresser, MD;  Location: [facility] CATH LAB;  Service: Cardiovascular;  Laterality: N/A;  . Video bronchoscopy Bilateral 12/25/2015    Procedure: VIDEO BRONCHOSCOPY WITHOUT FLUORO;  Surgeon: Javier Glazier, MD;  Location: [place];  Service: Cardiopulmonary;  Laterality: Bilateral;    Social History   Social History  . Marital Status: Widowed    Spouse Name: N/A  . Number of Children: N/A  . Years of Education: N/A   Occupational History  . [employer]     Social History Main Topics  . Smoking status: Former Smoker -- 20 years    Types: Cigarettes  . Smokeless tobacco: Never Used  . Alcohol Use: 6.0 oz/week    10 Glasses of wine per week     Comment: 1-2 drinks daily   . Drug Use: No  . Sexual Activity: Not on file   Other Topics Concern  . Not on file   Social History Narrative   1 caffeine drink daily     Family History  Problem Relation Age of Onset  . Colon cancer Neg Hx   . Heart disease Father     before age 45  . AAA (  abdominal aortic aneurysm) Father   . Heart disease Mother     Allergies as of 02/10/2016  . (No Known Allergies)    Current Outpatient Prescriptions on File Prior to Visit  Medication Sig Dispense Refill  . allopurinol (ZYLOPRIM) 100 MG tablet Take 100 mg by mouth daily.      Marland Kitchen atorvastatin (LIPITOR) 20 MG tablet Take 1 tablet (20 mg total) by mouth daily. 30 tablet 6  . beta carotene w/minerals (OCUVITE) tablet Take 1 tablet by mouth daily.    . Coenzyme Q10 (CO Q 10) 100 MG CAPS Take 100 mg by mouth daily.     Marland Kitchen doxazosin (CARDURA) 1 MG tablet Take 1-2 mg by mouth 2 (two) times daily. Take 2 tablets in the morning and take 1 tablet in the evening    . glucosamine-chondroitin 500-400 MG tablet Take 1 tablet by mouth daily.    . metoprolol succinate (TOPROL-XL) 25 MG 24 hr  tablet Take 25 mg by mouth daily as needed. Take one tablet by mouth every other day because if makes me feel sick per patient    . Omega-3 Fatty Acids (FISH OIL) 1000 MG CAPS Take 1,000-2,000 mg by mouth 2 (two) times daily. Take 2000 mg in the morning and 1000 mg in the evening    . ramipril (ALTACE) 5 MG capsule Take 5 mg by mouth daily.    . sitaGLIPtin (JANUVIA) 100 MG tablet Take 100 mg by mouth daily.    Marland Kitchen spironolactone (ALDACTONE) 25 MG tablet TAKE ONE TABLET EACH DAY 30 tablet 4  . warfarin (COUMADIN) 3 MG tablet Take 6 mg by mouth. Take two '3mg'$  tablets everyday except Wednesday, on Wednesday take one '3mg'$  tabelt    . ZETIA 10 MG tablet Take 10 mg by mouth daily.     Marland Kitchen atorvastatin (LIPITOR) 10 MG tablet Take 1 tablet (10 mg total) by mouth daily. (Patient not taking: Reported on 12/23/2015) 30 tablet 6  . furosemide (LASIX) 40 MG tablet '40mg'$  alternating with '20mg'$  daily in the morning (Patient not taking: Reported on 12/23/2015) 30 tablet 6  . omeprazole (PRILOSEC) 20 MG capsule Take 1 capsule (20 mg total) by mouth daily. (Patient not taking: Reported on 12/23/2015) 90 capsule 3  . warfarin (COUMADIN) 3 MG tablet USE AS DIRECTED BY ANTICOAGULATION CLINIC (Patient not taking: Reported on 12/23/2015) 70 tablet 3   No current facility-administered medications on file prior to visit.     REVIEW OF SYSTEMS: Cardiovascular: No chest pain, chest pressure, palpitations, orthopnea, or dyspnea on exertion. No claudication or rest pain,  No history of DVT or phlebitis. Pulmonary: No productive cough, asthma or wheezing. Neurologic: No weakness, paresthesias, aphasia, or amaurosis. No dizziness. Hematologic: No bleeding problems or clotting disorders. Musculoskeletal: No joint pain or joint swelling. Gastrointestinal: No blood in stool or hematemesis Genitourinary: No dysuria or hematuria. Psychiatric:: No history of major depression. Integumentary: No rashes or ulcers. Constitutional: Recent  virus  PHYSICAL EXAMINATION:   Vital signs are  Filed Vitals:   02/10/16 0841 02/10/16 0846  BP: 173/84 176/78  Pulse: 69   Height: '5\' 10"'$  (1.778 m)   Weight: 201 lb (91.173 kg)   SpO2: 96%    Body mass index is 28.84 kg/(m^2). General: The patient appears their stated age. HEENT:  No gross abnormalities Pulmonary:  Non labored breathing Abdomen: Soft and non-tender Musculoskeletal: There are no major deformities. Neurologic: No focal weakness or paresthesias are detected, Skin: There are no ulcer or rashes noted.  Psychiatric: The patient has normal affect. Cardiovascular: There is a regular rate and rhythm without significant murmur appreciated.   Diagnostic Studies Sounds ordered and reviewed today.  Maximum aortic diameter is 4 cm which is stable.  No endoleak was identified.  Assessment: Status post endovascular aneurysm repair Plan: Vascular: The patient is doing well.  History graft is in good position with no endoleak.  The aneurysm sac remains stable at 4 cm.  He will follow up in one year with a repeat ultrasound  Hypercholesterolemia: The patient continues to take a statin. Hypertension: Well managed on a statin Anticoagulation: The patient is on Coumadin.  No antiplatelet therapy. Tobacco: Former smoker  V. Leia Alf, M.D. [facility] Office: [phone] Pager:  [phone]

## 2016-02-10 NOTE — Addendum Note (Signed)
Addended by: Mena Goes on: 02/10/2016 10:39 AM   Modules accepted: Orders

## 2016-02-11 ENCOUNTER — Other Ambulatory Visit: Payer: Self-pay | Admitting: Cardiology

## 2016-02-20 ENCOUNTER — Ambulatory Visit (INDEPENDENT_AMBULATORY_CARE_PROVIDER_SITE_OTHER): Payer: Medicare Other

## 2016-02-20 DIAGNOSIS — I4891 Unspecified atrial fibrillation: Secondary | ICD-10-CM | POA: Diagnosis not present

## 2016-02-20 DIAGNOSIS — Z5181 Encounter for therapeutic drug level monitoring: Secondary | ICD-10-CM

## 2016-02-20 LAB — POCT INR: INR: 2.4

## 2016-03-23 ENCOUNTER — Encounter: Payer: Self-pay | Admitting: Acute Care

## 2016-03-23 ENCOUNTER — Ambulatory Visit (INDEPENDENT_AMBULATORY_CARE_PROVIDER_SITE_OTHER): Payer: Medicare Other | Admitting: Acute Care

## 2016-03-23 ENCOUNTER — Encounter: Payer: Self-pay | Admitting: Cardiology

## 2016-03-23 VITALS — BP 126/74 | HR 75 | Ht 70.0 in | Wt 195.0 lb

## 2016-03-23 DIAGNOSIS — R911 Solitary pulmonary nodule: Secondary | ICD-10-CM | POA: Insufficient documentation

## 2016-03-23 NOTE — Assessment & Plan Note (Signed)
Pt. Scheduled for 3 month follow up of 6 mm nodular density noted on CT scan 12/2015. Plan: You are Scheduled for a CT scan April 18 th, 2017 at 8:30 am. [address], Third floor. This is to follow up on the lung nodule found during your [facility]. Dr. Ashok Cordia will call you with results after it has been done. Please contact office for sooner follow up if symptoms do not improve or worsen or seek emergency care .

## 2016-03-23 NOTE — Progress Notes (Signed)
Subjective:    Patient ID: Nathaniel [name], male    DOB: [birth year], 82 y.o.   MRN: [id number]  HPI 82 yo with history of extensive vascular disease including CAD s/p CABG and redo CABG, AAA s/p repair, PAD, and carotid stenosis as well as prior AVR , seem by CCM on recent hospitalization ( Jan. 2017) for bronchoscopy and removal of foreign body from bronchus intermedius ( crown aspirated during dental procedure). Right Upper lobe pulmonary nodule for CT follow up in 03/2016.  Imaging: 12/24/2015 CT Chest w/o contrast:  A 6 mm right apical nodular density (series 201 image 13). Noted. This may be a focal area of scarring or postinflammatory change. Follow up scan in 3 months.  03/23/2016: Acute OV: Pt. Presents to the office with a some confusion regarding his follow up CT scan due this month. He has had several calls from a male who he states  has changed his scan date and he is concerned that it is being delayed too long.He is scheduled at [facility] for the scan 03/31/2016. We discussed the time and location of the exam. He knows where to go. He has no other needs or complaints today.   Current outpatient prescriptions:  .  allopurinol (ZYLOPRIM) 100 MG tablet, Take 100 mg by mouth daily.  , Disp: , Rfl:  .  atorvastatin (LIPITOR) 10 MG tablet, Take 1 tablet (10 mg total) by mouth daily., Disp: 30 tablet, Rfl: 6 .  atorvastatin (LIPITOR) 20 MG tablet, Take 1 tablet (20 mg total) by mouth daily., Disp: 30 tablet, Rfl: 6 .  beta carotene w/minerals (OCUVITE) tablet, Take 1 tablet by mouth daily., Disp: , Rfl:  .  Coenzyme Q10 (CO Q 10) 100 MG CAPS, Take 100 mg by mouth daily. , Disp: , Rfl:  .  doxazosin (CARDURA) 1 MG tablet, Take 1-2 mg by mouth 2 (two) times daily. Take 2 tablets in the morning and take 1 tablet in the evening, Disp: , Rfl:  .  furosemide (LASIX) 40 MG tablet, '40mg'$  alternating with '20mg'$  daily in the morning, Disp: 30 tablet, Rfl: 6 .  glucosamine-chondroitin 500-400  MG tablet, Take 1 tablet by mouth daily., Disp: , Rfl:  .  metoprolol succinate (TOPROL-XL) 25 MG 24 hr tablet, Take 25 mg by mouth daily as needed. Take one tablet by mouth every other day because if makes me feel sick per patient, Disp: , Rfl:  .  Omega-3 Fatty Acids (FISH OIL) 1000 MG CAPS, Take 1,000-2,000 mg by mouth 2 (two) times daily. Take 2000 mg in the morning and 1000 mg in the evening, Disp: , Rfl:  .  ramipril (ALTACE) 5 MG capsule, TAKE ONE CAPSULE EACH DAY, Disp: 90 capsule, Rfl: 1 .  sitaGLIPtin (JANUVIA) 100 MG tablet, Take 100 mg by mouth daily., Disp: , Rfl:  .  spironolactone (ALDACTONE) 25 MG tablet, TAKE ONE TABLET EACH DAY, Disp: 30 tablet, Rfl: 4 .  warfarin (COUMADIN) 3 MG tablet, Take 6 mg by mouth. Take two '3mg'$  tablets everyday except Wednesday, on Wednesday take one '3mg'$  tabelt, Disp: , Rfl:  .  ZETIA 10 MG tablet, Take 10 mg by mouth daily. , Disp: , Rfl:    Past Medical History  Diagnosis Date  . CAD (coronary artery disease)   . Atrial fibrillation ([place])   . Diabetes mellitus   . Hyperlipemia   . Hx: UTI (urinary tract infection)   . Gout   . Hypertension   .  Cataract   . GERD (gastroesophageal reflux disease)   . Peripheral vascular disease ([place])   . Atrial fibrillation ([place])   . Shortness of breath   . Hernia, abdominal   . CHF (congestive heart failure) ([place])   . Colon polyps     adenomatous  . Diverticulosis   . Internal hemorrhoids   No Known Allergies Review of Systems    Constitutional:   No  weight loss, night sweats,  Fevers, chills, fatigue, or  lassitude.  HEENT:   No headaches,  Difficulty swallowing,  Tooth/dental problems, or  Sore throat,                No sneezing, itching, ear ache, nasal congestion, post nasal drip,   CV:  No chest pain,  Orthopnea, PND, swelling in lower extremities, anasarca, dizziness, palpitations, syncope.   GI  No heartburn, indigestion, abdominal pain, nausea, vomiting, diarrhea, change in bowel habits,  loss of appetite, bloody stools.   Resp: No shortness of breath with exertion or at rest.  No excess mucus, no productive cough,  No non-productive cough,  No coughing up of blood.  No change in color of mucus.  No wheezing.  No chest wall deformity  Skin: no rash or lesions.  GU: no dysuria, change in color of urine, no urgency or frequency.  No flank pain, no hematuria   MS:  No joint pain or swelling.  No decreased range of motion.  No back pain.  Psych:  No change in mood or affect. No depression or anxiety.  No memory loss.    Objective:   Physical Exam BP 126/74 mmHg  Pulse 75  Ht '5\' 10"'$  (1.778 m)  Wt 195 lb (88.451 kg)  BMI 27.98 kg/m2  SpO2 96%   Physical Exam:  General- No distress,  A&Ox3, somewhat confused about appointments ENT: No sinus tenderness, TM clear, pale nasal mucosa, no oral exudate,no post nasal drip, no LAN Cardiac: S1, S2, regular rate and rhythm, no murmur Chest: No wheeze/ rales/ dullness; no accessory muscle use, no nasal flaring, no sternal retractions Abd.: Soft Non-tender Ext: No clubbing cyanosis, edema Neuro:  normal strength Skin: No rashes, warm and dry Psych: normal mood and behavior     Magdalen Spatz, AGACNP-BC [place] Pager # [phone] 03/23/2016       Assessment & Plan:

## 2016-03-23 NOTE — Patient Instructions (Addendum)
It is nice to meet you today. You are Scheduled for a CT scan April 18 th, 2017 at 8:30 am. [address], Third floor. This is to follow up on the lung nodule found during your [facility]. Dr. Ashok Cordia will call you with results after it has been done. Please contact office for sooner follow up if symptoms do not improve or worsen or seek emergency care .

## 2016-03-30 ENCOUNTER — Inpatient Hospital Stay: Admission: RE | Admit: 2016-03-30 | Payer: Medicare Other | Source: Ambulatory Visit

## 2016-03-31 ENCOUNTER — Ambulatory Visit (INDEPENDENT_AMBULATORY_CARE_PROVIDER_SITE_OTHER): Payer: Medicare Other | Admitting: *Deleted

## 2016-03-31 ENCOUNTER — Ambulatory Visit (INDEPENDENT_AMBULATORY_CARE_PROVIDER_SITE_OTHER)
Admission: RE | Admit: 2016-03-31 | Discharge: 2016-03-31 | Disposition: A | Discharge status: Home / Self Care | Payer: Medicare Other | Source: Ambulatory Visit | Attending: Acute Care | Admitting: Acute Care

## 2016-03-31 DIAGNOSIS — I4891 Unspecified atrial fibrillation: Secondary | ICD-10-CM

## 2016-03-31 DIAGNOSIS — R911 Solitary pulmonary nodule: Secondary | ICD-10-CM | POA: Diagnosis not present

## 2016-03-31 DIAGNOSIS — R918 Other nonspecific abnormal finding of lung field: Secondary | ICD-10-CM | POA: Diagnosis not present

## 2016-03-31 DIAGNOSIS — Z5181 Encounter for therapeutic drug level monitoring: Secondary | ICD-10-CM | POA: Diagnosis not present

## 2016-03-31 LAB — POCT INR: INR: 2.4

## 2016-04-07 ENCOUNTER — Inpatient Hospital Stay: Payer: Medicare Other | Admitting: Pulmonary Disease

## 2016-04-27 ENCOUNTER — Telehealth: Payer: Self-pay | Admitting: Acute Care

## 2016-04-27 DIAGNOSIS — R911 Solitary pulmonary nodule: Secondary | ICD-10-CM

## 2016-04-27 NOTE — Telephone Encounter (Signed)
Patient notified of CT results CT ordered for July 2017 per Dr. Ammie Dalton request. Will need OV after CT scan.  [facility] - please let triage know when the CT has been ordered so we can schedule follow up appointment after CT has been done. Thanks.

## 2016-04-28 DIAGNOSIS — H53432 Sector or arcuate defects, left eye: Secondary | ICD-10-CM | POA: Diagnosis not present

## 2016-04-28 DIAGNOSIS — H401121 Primary open-angle glaucoma, left eye, mild stage: Secondary | ICD-10-CM | POA: Diagnosis not present

## 2016-04-29 NOTE — Telephone Encounter (Signed)
Pt scheduled for CT on 7/19, scheduled for rov on 7/24.  Will close message.

## 2016-05-06 DIAGNOSIS — E782 Mixed hyperlipidemia: Secondary | ICD-10-CM | POA: Diagnosis not present

## 2016-05-06 DIAGNOSIS — E1159 Type 2 diabetes mellitus with other circulatory complications: Secondary | ICD-10-CM | POA: Diagnosis not present

## 2016-05-08 DIAGNOSIS — M109 Gout, unspecified: Secondary | ICD-10-CM | POA: Diagnosis not present

## 2016-05-08 DIAGNOSIS — E1159 Type 2 diabetes mellitus with other circulatory complications: Secondary | ICD-10-CM | POA: Diagnosis not present

## 2016-05-08 DIAGNOSIS — I70203 Unspecified atherosclerosis of native arteries of extremities, bilateral legs: Secondary | ICD-10-CM | POA: Diagnosis not present

## 2016-05-08 DIAGNOSIS — N183 Chronic kidney disease, stage 3 (moderate): Secondary | ICD-10-CM | POA: Diagnosis not present

## 2016-05-08 DIAGNOSIS — I10 Essential (primary) hypertension: Secondary | ICD-10-CM | POA: Diagnosis not present

## 2016-05-08 DIAGNOSIS — E782 Mixed hyperlipidemia: Secondary | ICD-10-CM | POA: Diagnosis not present

## 2016-05-08 DIAGNOSIS — E1165 Type 2 diabetes mellitus with hyperglycemia: Secondary | ICD-10-CM | POA: Diagnosis not present

## 2016-05-08 DIAGNOSIS — I2510 Atherosclerotic heart disease of native coronary artery without angina pectoris: Secondary | ICD-10-CM | POA: Diagnosis not present

## 2016-05-12 ENCOUNTER — Ambulatory Visit (INDEPENDENT_AMBULATORY_CARE_PROVIDER_SITE_OTHER): Payer: Medicare Other | Admitting: *Deleted

## 2016-05-12 DIAGNOSIS — I4891 Unspecified atrial fibrillation: Secondary | ICD-10-CM | POA: Diagnosis not present

## 2016-05-12 DIAGNOSIS — Z5181 Encounter for therapeutic drug level monitoring: Secondary | ICD-10-CM

## 2016-05-12 LAB — POCT INR: INR: 2

## 2016-05-13 DIAGNOSIS — H401121 Primary open-angle glaucoma, left eye, mild stage: Secondary | ICD-10-CM | POA: Diagnosis not present

## 2016-05-14 ENCOUNTER — Other Ambulatory Visit: Payer: Self-pay | Admitting: Cardiology

## 2016-05-25 ENCOUNTER — Other Ambulatory Visit: Payer: Self-pay | Admitting: Cardiology

## 2016-05-28 DIAGNOSIS — H401122 Primary open-angle glaucoma, left eye, moderate stage: Secondary | ICD-10-CM | POA: Diagnosis not present

## 2016-06-11 DIAGNOSIS — H40011 Open angle with borderline findings, low risk, right eye: Secondary | ICD-10-CM | POA: Diagnosis not present

## 2016-06-11 DIAGNOSIS — H401122 Primary open-angle glaucoma, left eye, moderate stage: Secondary | ICD-10-CM | POA: Diagnosis not present

## 2016-06-13 DIAGNOSIS — S42202A Unspecified fracture of upper end of left humerus, initial encounter for closed fracture: Secondary | ICD-10-CM | POA: Diagnosis not present

## 2016-06-15 ENCOUNTER — Other Ambulatory Visit: Payer: Self-pay | Admitting: Orthopedic Surgery

## 2016-06-15 ENCOUNTER — Ambulatory Visit
Admission: RE | Admit: 2016-06-15 | Discharge: 2016-06-15 | Disposition: A | Discharge status: Home / Self Care | Payer: Medicare Other | Source: Ambulatory Visit | Attending: Orthopedic Surgery | Admitting: Orthopedic Surgery

## 2016-06-15 DIAGNOSIS — R937 Abnormal findings on diagnostic imaging of other parts of musculoskeletal system: Secondary | ICD-10-CM | POA: Diagnosis not present

## 2016-06-15 DIAGNOSIS — S42302A Unspecified fracture of shaft of humerus, left arm, initial encounter for closed fracture: Secondary | ICD-10-CM

## 2016-06-15 DIAGNOSIS — M25512 Pain in left shoulder: Secondary | ICD-10-CM | POA: Diagnosis not present

## 2016-06-15 DIAGNOSIS — S42212A Unspecified displaced fracture of surgical neck of left humerus, initial encounter for closed fracture: Secondary | ICD-10-CM | POA: Diagnosis not present

## 2016-06-18 ENCOUNTER — Telehealth: Payer: Self-pay | Admitting: Pharmacist

## 2016-06-18 NOTE — Telephone Encounter (Signed)
Pt walked into clinic today - not scheduled for INR check until 7/11. [place] [address] and broke his shoulder last Friday 6/30. Went to [facility] 7/3 for CT scan, seeing Dr. Flossie Dibble on 7/10 with Raliegh Ip. Pt decided he would stop taking Coumadin in case he has an operation on 7/10 because a friend told him he couldn't have a procedure while on his Coumadin. Held his Coumadin for 3 days before his sister told him to restart it. Came in wanting recommendation for Coumadin.  Advised pt that we cannot give a recommendation for holding Coumadin when pt does not have an actual procedure scheduled yet. Do not know what the procedure would be in terms of bleeding risk or when it will be scheduled. Advised him to continue his Coumadin, he will give [place] an update after his appt on 7/10 and reported that he will walk over with a clearance form after his appt.

## 2016-06-22 DIAGNOSIS — S42202A Unspecified fracture of upper end of left humerus, initial encounter for closed fracture: Secondary | ICD-10-CM | POA: Diagnosis not present

## 2016-06-23 ENCOUNTER — Ambulatory Visit (INDEPENDENT_AMBULATORY_CARE_PROVIDER_SITE_OTHER): Payer: Medicare Other

## 2016-06-23 ENCOUNTER — Encounter (INDEPENDENT_AMBULATORY_CARE_PROVIDER_SITE_OTHER): Payer: Self-pay

## 2016-06-23 DIAGNOSIS — Z5181 Encounter for therapeutic drug level monitoring: Secondary | ICD-10-CM

## 2016-06-23 DIAGNOSIS — I4891 Unspecified atrial fibrillation: Secondary | ICD-10-CM | POA: Diagnosis not present

## 2016-06-23 LAB — POCT INR: INR: 1.2

## 2016-07-01 ENCOUNTER — Ambulatory Visit (INDEPENDENT_AMBULATORY_CARE_PROVIDER_SITE_OTHER): Payer: Medicare Other | Admitting: *Deleted

## 2016-07-01 ENCOUNTER — Ambulatory Visit (INDEPENDENT_AMBULATORY_CARE_PROVIDER_SITE_OTHER)
Admission: RE | Admit: 2016-07-01 | Discharge: 2016-07-01 | Disposition: A | Discharge status: Home / Self Care | Payer: Medicare Other | Source: Ambulatory Visit | Attending: Pulmonary Disease | Admitting: Pulmonary Disease

## 2016-07-01 DIAGNOSIS — R911 Solitary pulmonary nodule: Secondary | ICD-10-CM | POA: Diagnosis not present

## 2016-07-01 DIAGNOSIS — I4891 Unspecified atrial fibrillation: Secondary | ICD-10-CM | POA: Diagnosis not present

## 2016-07-01 DIAGNOSIS — Z5181 Encounter for therapeutic drug level monitoring: Secondary | ICD-10-CM | POA: Diagnosis not present

## 2016-07-01 DIAGNOSIS — R918 Other nonspecific abnormal finding of lung field: Secondary | ICD-10-CM | POA: Diagnosis not present

## 2016-07-01 LAB — POCT INR: INR: 2

## 2016-07-06 ENCOUNTER — Ambulatory Visit (INDEPENDENT_AMBULATORY_CARE_PROVIDER_SITE_OTHER): Payer: Medicare Other | Admitting: Pulmonary Disease

## 2016-07-06 VITALS — BP 144/82 | HR 67 | Ht 70.0 in | Wt 203.2 lb

## 2016-07-06 DIAGNOSIS — R918 Other nonspecific abnormal finding of lung field: Secondary | ICD-10-CM

## 2016-07-06 NOTE — Patient Instructions (Signed)
   Call me if you have any new breathing problems or questions before your next appointment.  I will see you back in January after your CT scan.  TESTS ORDERED: 1. CT Chest w/O in January 2018

## 2016-07-06 NOTE — Progress Notes (Signed)
Subjective:    Patient ID: Nathaniel [name], male    DOB: 06-24-1932, 82 y.o.   MRN: [id number]  [facility].:  Follow-up for Lung Nodules.  HPI Lung Nodules: 7 mm nodule right upper lobe, 4 mm nodule left lower lobe on recent CT scan this month. Right upper lobe nodule has not appreciably changed in size since January 2017. Denies any chest pain or pressure.   Review of Systems Denies any dyspnea or coughing. No fever, chills, or sweats. No adenopathy in his neck, groin, or axilla.   No Known Allergies  Current Outpatient Prescriptions on File Prior to Visit  Medication Sig Dispense Refill  . allopurinol (ZYLOPRIM) 100 MG tablet Take 100 mg by mouth daily.      Marland Kitchen atorvastatin (LIPITOR) 10 MG tablet Take 1 tablet (10 mg total) by mouth daily. 30 tablet 6  . atorvastatin (LIPITOR) 20 MG tablet TAKE ONE TABLET EACH DAY 30 tablet 5  . beta carotene w/minerals (OCUVITE) tablet Take 1 tablet by mouth daily.    . Coenzyme Q10 (CO Q 10) 100 MG CAPS Take 100 mg by mouth daily.     Marland Kitchen doxazosin (CARDURA) 1 MG tablet Take 1-2 mg by mouth 2 (two) times daily. Take 2 tablets in the morning and take 1 tablet in the evening    . furosemide (LASIX) 40 MG tablet '40mg'$  alternating with '20mg'$  daily in the morning 30 tablet 6  . glucosamine-chondroitin 500-400 MG tablet Take 1 tablet by mouth daily.    . Omega-3 Fatty Acids (FISH OIL) 1000 MG CAPS Take 1,000-2,000 mg by mouth 2 (two) times daily. Take 2000 mg in the morning and 1000 mg in the evening    . ramipril (ALTACE) 5 MG capsule TAKE ONE CAPSULE EACH DAY 90 capsule 1  . sitaGLIPtin (JANUVIA) 100 MG tablet Take 100 mg by mouth daily.    Marland Kitchen spironolactone (ALDACTONE) 25 MG tablet TAKE ONE TABLET EACH DAY 30 tablet 4  . warfarin (COUMADIN) 3 MG tablet Take 6 mg by mouth. Take two '3mg'$  tablets everyday except Wednesday, on Wednesday take one '3mg'$  tabelt    . warfarin (COUMADIN) 3 MG tablet TAKE AS DIRECTED BY COUMADIN CLINIC 70 tablet 3  . ZETIA 10 MG tablet Take 10 mg  by mouth daily.      No current facility-administered medications on file prior to visit.     Past Medical History:  Diagnosis Date  . Atrial fibrillation ([place])   . Atrial fibrillation ([place])   . CAD (coronary artery disease)   . Cataract   . CHF (congestive heart failure) ([place])   . Colon polyps    adenomatous  . Diabetes mellitus   . Diverticulosis   . GERD (gastroesophageal reflux disease)   . Gout   . Hernia, abdominal   . Hx: UTI (urinary tract infection)   . Hyperlipemia   . Hypertension   . Internal hemorrhoids   . Peripheral vascular disease ([place])   . Shortness of breath     Past Surgical History:  Procedure Laterality Date  . ABDOMINAL AORTIC ENDOVASCULAR STENT GRAFT N/A 04/13/2014   Procedure: ABDOMINAL AORTIC ENDOVASCULAR STENT GRAFT;  Surgeon: Serafina Mitchell, MD;  Location: [place];  Service: Vascular;  Laterality: N/A;  . ANGIOPLASTY     Unsure if he had stents put in   . APPENDECTOMY    . CARDIAC VALVE SURGERY    . CARDIOVERSION N/A 06/06/2014   Procedure: CARDIOVERSION;  Surgeon: Larey Dresser,  MD;  Location: [place];  Service: Cardiovascular;  Laterality: N/A;  . CARDIOVERSION N/A 08/21/2014   Procedure: CARDIOVERSION;  Surgeon: Larey Dresser, MD;  Location: [place];  Service: Cardiovascular;  Laterality: N/A;  . CORONARY ARTERY BYPASS GRAFT    . EYE SURGERY Bilateral    cataract surgery  . HERNIA REPAIR     2 operations  . LEFT HEART CATHETERIZATION WITH CORONARY/GRAFT ANGIOGRAM N/A 07/10/2014   Procedure: LEFT HEART CATHETERIZATION WITH Beatrix Fetters;  Surgeon: Larey Dresser, MD;  Location: [facility] CATH LAB;  Service: Cardiovascular;  Laterality: N/A;  . TONSILLECTOMY    . VIDEO BRONCHOSCOPY Bilateral 12/25/2015   Procedure: VIDEO BRONCHOSCOPY WITHOUT FLUORO;  Surgeon: Javier Glazier, MD;  Location: [place];  Service: Cardiopulmonary;  Laterality: Bilateral;    Family History  Problem Relation Age of Onset  . Colon cancer Neg  Hx   . Heart disease Father     before age 19  . AAA (abdominal aortic aneurysm) Father   . Heart disease Mother     Social History   Social History  . Marital status: Widowed    Spouse name: N/A  . Number of children: N/A  . Years of education: N/A   Occupational History  . [employer]     Social History Main Topics  . Smoking status: Former Smoker    Packs/day: 0.25    Years: 20.00    Types: Cigarettes    Quit date: 03/23/1992  . Smokeless tobacco: Never Used  . Alcohol use 6.0 oz/week    10 Glasses of wine per week     Comment: 1-2 drinks daily   . Drug use: No  . Sexual activity: Not on file   Other Topics Concern  . Not on file   Social History Narrative   1 caffeine drink daily       Objective:   Physical Exam BP (!) 144/82 (BP Location: Left Arm, Cuff Size: Normal)   Pulse 67   Ht '5\' 10"'$  (1.778 m)   Wt 203 lb 3.2 oz (92.2 kg)   SpO2 95%   BMI 29.16 kg/m  General:  Awake. Alert. No distress.  Integument:  Warm & dry. No rash on exposed skin. No bruising. HEENT:  Moist mucus membranes. No oral ulcers. No scleral injection or icterus.  Cardiovascular:  Regular rate. No edema. Normal S1 & S2. Pulmonary:  Good aeration & clear to auscultation bilaterally. Symmetric chest wall expansion. No accessory muscle use. Abdomen: Soft. Normal bowel sounds. Nontender. Musculoskeletal:  Normal bulk and tone. No joint deformity or effusion appreciated.  IMAGING CT CHEST W/O 07/01/16 (personally reviewed by me): 7 mm right upper lobe & 4 mm left lower lobe nodules noted. Stable in size. No new or developing nodule or mass. No pleural effusion or thickening. No pathologic mediastinal adenopathy. No pericardial effusion.  CT CHEST W/O 12/24/15 (personally reviewed by me): 6 mm right upper lobe nodule. No other nodule or opacity appreciated. No pleural effusion or thickening. No pericardial effusion. No pathologic mediastinal adenopathy. Metallic density noted in bronchus  intermedius.    Assessment & Plan:  82 year old male with subcentimeter lung nodules. Right upper lobe lung nodule appears stable in size. 4 mm left lower lobe nodule also appears stable in size on radiology report. Reviewed results with patient today and given his history planning for repeat CT imaging according to guidelines in January 2018. Advised patient to contact my office if he has any new breathing problems  or questions before his next appointment.  1. Lung Nodules: Repeat CT chest without contrast January 2018.  2. Follow-up: Patient to return to clinic in January 2018 to review CT scan results.  Sonia Baller Ashok Cordia, M.D. [facility] Pager:  [phone] After 3pm or if no response, call [phone] 3:17 PM 07/06/16   3.

## 2016-07-13 DIAGNOSIS — M25512 Pain in left shoulder: Secondary | ICD-10-CM | POA: Diagnosis not present

## 2016-07-14 DIAGNOSIS — R531 Weakness: Secondary | ICD-10-CM | POA: Diagnosis not present

## 2016-07-14 DIAGNOSIS — S42202D Unspecified fracture of upper end of left humerus, subsequent encounter for fracture with routine healing: Secondary | ICD-10-CM | POA: Diagnosis not present

## 2016-07-14 DIAGNOSIS — M25512 Pain in left shoulder: Secondary | ICD-10-CM | POA: Diagnosis not present

## 2016-07-14 DIAGNOSIS — M25612 Stiffness of left shoulder, not elsewhere classified: Secondary | ICD-10-CM | POA: Diagnosis not present

## 2016-07-15 ENCOUNTER — Ambulatory Visit (INDEPENDENT_AMBULATORY_CARE_PROVIDER_SITE_OTHER): Payer: Medicare Other | Admitting: *Deleted

## 2016-07-15 DIAGNOSIS — Z5181 Encounter for therapeutic drug level monitoring: Secondary | ICD-10-CM

## 2016-07-15 DIAGNOSIS — I4891 Unspecified atrial fibrillation: Secondary | ICD-10-CM

## 2016-07-15 LAB — POCT INR: INR: 1.5

## 2016-07-22 DIAGNOSIS — M25512 Pain in left shoulder: Secondary | ICD-10-CM | POA: Diagnosis not present

## 2016-07-22 DIAGNOSIS — S42202D Unspecified fracture of upper end of left humerus, subsequent encounter for fracture with routine healing: Secondary | ICD-10-CM | POA: Diagnosis not present

## 2016-07-22 DIAGNOSIS — R531 Weakness: Secondary | ICD-10-CM | POA: Diagnosis not present

## 2016-07-22 DIAGNOSIS — M25612 Stiffness of left shoulder, not elsewhere classified: Secondary | ICD-10-CM | POA: Diagnosis not present

## 2016-07-23 ENCOUNTER — Ambulatory Visit (INDEPENDENT_AMBULATORY_CARE_PROVIDER_SITE_OTHER): Payer: Medicare Other | Admitting: *Deleted

## 2016-07-23 ENCOUNTER — Encounter (INDEPENDENT_AMBULATORY_CARE_PROVIDER_SITE_OTHER): Payer: Self-pay

## 2016-07-23 DIAGNOSIS — I4891 Unspecified atrial fibrillation: Secondary | ICD-10-CM | POA: Diagnosis not present

## 2016-07-23 DIAGNOSIS — Z5181 Encounter for therapeutic drug level monitoring: Secondary | ICD-10-CM

## 2016-07-23 LAB — POCT INR: INR: 2

## 2016-07-24 DIAGNOSIS — M25512 Pain in left shoulder: Secondary | ICD-10-CM | POA: Diagnosis not present

## 2016-07-24 DIAGNOSIS — M25612 Stiffness of left shoulder, not elsewhere classified: Secondary | ICD-10-CM | POA: Diagnosis not present

## 2016-07-24 DIAGNOSIS — R531 Weakness: Secondary | ICD-10-CM | POA: Diagnosis not present

## 2016-07-24 DIAGNOSIS — R262 Difficulty in walking, not elsewhere classified: Secondary | ICD-10-CM | POA: Diagnosis not present

## 2016-07-30 DIAGNOSIS — S42202D Unspecified fracture of upper end of left humerus, subsequent encounter for fracture with routine healing: Secondary | ICD-10-CM | POA: Diagnosis not present

## 2016-07-30 DIAGNOSIS — M25512 Pain in left shoulder: Secondary | ICD-10-CM | POA: Diagnosis not present

## 2016-07-30 DIAGNOSIS — M25612 Stiffness of left shoulder, not elsewhere classified: Secondary | ICD-10-CM | POA: Diagnosis not present

## 2016-07-30 DIAGNOSIS — R531 Weakness: Secondary | ICD-10-CM | POA: Diagnosis not present

## 2016-08-06 ENCOUNTER — Encounter (INDEPENDENT_AMBULATORY_CARE_PROVIDER_SITE_OTHER): Payer: Self-pay

## 2016-08-06 ENCOUNTER — Ambulatory Visit (INDEPENDENT_AMBULATORY_CARE_PROVIDER_SITE_OTHER): Payer: Medicare Other | Admitting: *Deleted

## 2016-08-06 DIAGNOSIS — I4891 Unspecified atrial fibrillation: Secondary | ICD-10-CM

## 2016-08-06 DIAGNOSIS — Z5181 Encounter for therapeutic drug level monitoring: Secondary | ICD-10-CM | POA: Diagnosis not present

## 2016-08-06 LAB — POCT INR: INR: 2

## 2016-08-07 DIAGNOSIS — M25512 Pain in left shoulder: Secondary | ICD-10-CM | POA: Diagnosis not present

## 2016-08-07 DIAGNOSIS — S42202D Unspecified fracture of upper end of left humerus, subsequent encounter for fracture with routine healing: Secondary | ICD-10-CM | POA: Diagnosis not present

## 2016-08-07 DIAGNOSIS — M25612 Stiffness of left shoulder, not elsewhere classified: Secondary | ICD-10-CM | POA: Diagnosis not present

## 2016-08-07 DIAGNOSIS — R531 Weakness: Secondary | ICD-10-CM | POA: Diagnosis not present

## 2016-08-11 DIAGNOSIS — E1165 Type 2 diabetes mellitus with hyperglycemia: Secondary | ICD-10-CM | POA: Diagnosis not present

## 2016-08-11 DIAGNOSIS — E782 Mixed hyperlipidemia: Secondary | ICD-10-CM | POA: Diagnosis not present

## 2016-08-13 DIAGNOSIS — M25512 Pain in left shoulder: Secondary | ICD-10-CM | POA: Diagnosis not present

## 2016-08-13 DIAGNOSIS — S42202D Unspecified fracture of upper end of left humerus, subsequent encounter for fracture with routine healing: Secondary | ICD-10-CM | POA: Diagnosis not present

## 2016-08-13 DIAGNOSIS — R531 Weakness: Secondary | ICD-10-CM | POA: Diagnosis not present

## 2016-08-13 DIAGNOSIS — M25612 Stiffness of left shoulder, not elsewhere classified: Secondary | ICD-10-CM | POA: Diagnosis not present

## 2016-08-14 DIAGNOSIS — I2510 Atherosclerotic heart disease of native coronary artery without angina pectoris: Secondary | ICD-10-CM | POA: Diagnosis not present

## 2016-08-14 DIAGNOSIS — N183 Chronic kidney disease, stage 3 (moderate): Secondary | ICD-10-CM | POA: Diagnosis not present

## 2016-08-14 DIAGNOSIS — I70203 Unspecified atherosclerosis of native arteries of extremities, bilateral legs: Secondary | ICD-10-CM | POA: Diagnosis not present

## 2016-08-14 DIAGNOSIS — E782 Mixed hyperlipidemia: Secondary | ICD-10-CM | POA: Diagnosis not present

## 2016-08-14 DIAGNOSIS — E1159 Type 2 diabetes mellitus with other circulatory complications: Secondary | ICD-10-CM | POA: Diagnosis not present

## 2016-08-14 DIAGNOSIS — I10 Essential (primary) hypertension: Secondary | ICD-10-CM | POA: Diagnosis not present

## 2016-08-14 DIAGNOSIS — E1165 Type 2 diabetes mellitus with hyperglycemia: Secondary | ICD-10-CM | POA: Diagnosis not present

## 2016-08-14 DIAGNOSIS — M109 Gout, unspecified: Secondary | ICD-10-CM | POA: Diagnosis not present

## 2016-08-18 DIAGNOSIS — S42202D Unspecified fracture of upper end of left humerus, subsequent encounter for fracture with routine healing: Secondary | ICD-10-CM | POA: Diagnosis not present

## 2016-09-08 ENCOUNTER — Ambulatory Visit (INDEPENDENT_AMBULATORY_CARE_PROVIDER_SITE_OTHER): Payer: Medicare Other | Admitting: Pharmacist

## 2016-09-08 DIAGNOSIS — Z5181 Encounter for therapeutic drug level monitoring: Secondary | ICD-10-CM | POA: Diagnosis not present

## 2016-09-08 DIAGNOSIS — I4891 Unspecified atrial fibrillation: Secondary | ICD-10-CM

## 2016-09-08 LAB — POCT INR: INR: 1.8

## 2016-09-30 ENCOUNTER — Ambulatory Visit (INDEPENDENT_AMBULATORY_CARE_PROVIDER_SITE_OTHER): Payer: Medicare Other | Admitting: *Deleted

## 2016-09-30 DIAGNOSIS — Z5181 Encounter for therapeutic drug level monitoring: Secondary | ICD-10-CM

## 2016-09-30 DIAGNOSIS — I4891 Unspecified atrial fibrillation: Secondary | ICD-10-CM

## 2016-09-30 LAB — POCT INR: INR: 1.3

## 2016-10-07 ENCOUNTER — Ambulatory Visit (INDEPENDENT_AMBULATORY_CARE_PROVIDER_SITE_OTHER): Payer: Medicare Other | Admitting: Pharmacist

## 2016-10-07 DIAGNOSIS — Z5181 Encounter for therapeutic drug level monitoring: Secondary | ICD-10-CM

## 2016-10-07 DIAGNOSIS — I4891 Unspecified atrial fibrillation: Secondary | ICD-10-CM

## 2016-10-07 LAB — POCT INR: INR: 1.2

## 2016-10-12 DIAGNOSIS — E119 Type 2 diabetes mellitus without complications: Secondary | ICD-10-CM | POA: Diagnosis not present

## 2016-10-12 DIAGNOSIS — H40011 Open angle with borderline findings, low risk, right eye: Secondary | ICD-10-CM | POA: Diagnosis not present

## 2016-10-12 DIAGNOSIS — H401122 Primary open-angle glaucoma, left eye, moderate stage: Secondary | ICD-10-CM | POA: Diagnosis not present

## 2016-10-12 DIAGNOSIS — H53432 Sector or arcuate defects, left eye: Secondary | ICD-10-CM | POA: Diagnosis not present

## 2016-10-12 DIAGNOSIS — H35372 Puckering of macula, left eye: Secondary | ICD-10-CM | POA: Diagnosis not present

## 2016-10-14 ENCOUNTER — Ambulatory Visit (INDEPENDENT_AMBULATORY_CARE_PROVIDER_SITE_OTHER): Payer: Medicare Other | Admitting: *Deleted

## 2016-10-14 DIAGNOSIS — I4891 Unspecified atrial fibrillation: Secondary | ICD-10-CM

## 2016-10-14 DIAGNOSIS — Z5181 Encounter for therapeutic drug level monitoring: Secondary | ICD-10-CM

## 2016-10-14 LAB — POCT INR: INR: 1.7

## 2016-10-22 ENCOUNTER — Other Ambulatory Visit: Payer: Self-pay | Admitting: Cardiology

## 2016-10-22 DIAGNOSIS — I779 Disorder of arteries and arterioles, unspecified: Secondary | ICD-10-CM

## 2016-10-22 DIAGNOSIS — I739 Peripheral vascular disease, unspecified: Secondary | ICD-10-CM

## 2016-10-22 DIAGNOSIS — I5022 Chronic systolic (congestive) heart failure: Secondary | ICD-10-CM

## 2016-10-22 DIAGNOSIS — I480 Paroxysmal atrial fibrillation: Secondary | ICD-10-CM

## 2016-10-28 ENCOUNTER — Ambulatory Visit (INDEPENDENT_AMBULATORY_CARE_PROVIDER_SITE_OTHER): Payer: Medicare Other | Admitting: *Deleted

## 2016-10-28 DIAGNOSIS — Z5181 Encounter for therapeutic drug level monitoring: Secondary | ICD-10-CM | POA: Diagnosis not present

## 2016-10-28 LAB — POCT INR: INR: 2.5

## 2016-11-12 DIAGNOSIS — E1165 Type 2 diabetes mellitus with hyperglycemia: Secondary | ICD-10-CM | POA: Diagnosis not present

## 2016-11-12 DIAGNOSIS — E782 Mixed hyperlipidemia: Secondary | ICD-10-CM | POA: Diagnosis not present

## 2016-11-17 DIAGNOSIS — I70203 Unspecified atherosclerosis of native arteries of extremities, bilateral legs: Secondary | ICD-10-CM | POA: Diagnosis not present

## 2016-11-17 DIAGNOSIS — E1165 Type 2 diabetes mellitus with hyperglycemia: Secondary | ICD-10-CM | POA: Diagnosis not present

## 2016-11-17 DIAGNOSIS — N183 Chronic kidney disease, stage 3 (moderate): Secondary | ICD-10-CM | POA: Diagnosis not present

## 2016-11-17 DIAGNOSIS — I10 Essential (primary) hypertension: Secondary | ICD-10-CM | POA: Diagnosis not present

## 2016-11-17 DIAGNOSIS — M109 Gout, unspecified: Secondary | ICD-10-CM | POA: Diagnosis not present

## 2016-11-17 DIAGNOSIS — I2510 Atherosclerotic heart disease of native coronary artery without angina pectoris: Secondary | ICD-10-CM | POA: Diagnosis not present

## 2016-11-17 DIAGNOSIS — E782 Mixed hyperlipidemia: Secondary | ICD-10-CM | POA: Diagnosis not present

## 2016-11-17 DIAGNOSIS — E1159 Type 2 diabetes mellitus with other circulatory complications: Secondary | ICD-10-CM | POA: Diagnosis not present

## 2016-11-18 ENCOUNTER — Ambulatory Visit (INDEPENDENT_AMBULATORY_CARE_PROVIDER_SITE_OTHER): Payer: Medicare Other | Admitting: Pharmacist

## 2016-11-18 DIAGNOSIS — Z5181 Encounter for therapeutic drug level monitoring: Secondary | ICD-10-CM | POA: Diagnosis not present

## 2016-11-18 LAB — POCT INR: INR: 1.3

## 2016-11-20 ENCOUNTER — Other Ambulatory Visit: Payer: Self-pay | Admitting: Cardiology

## 2016-11-26 ENCOUNTER — Ambulatory Visit (INDEPENDENT_AMBULATORY_CARE_PROVIDER_SITE_OTHER): Payer: Medicare Other | Admitting: *Deleted

## 2016-11-26 DIAGNOSIS — Z5181 Encounter for therapeutic drug level monitoring: Secondary | ICD-10-CM | POA: Diagnosis not present

## 2016-11-26 LAB — POCT INR: INR: 2.2

## 2016-12-10 ENCOUNTER — Ambulatory Visit (INDEPENDENT_AMBULATORY_CARE_PROVIDER_SITE_OTHER): Payer: Medicare Other | Admitting: Pharmacist

## 2016-12-10 ENCOUNTER — Telehealth (HOSPITAL_COMMUNITY): Payer: Self-pay | Admitting: Cardiology

## 2016-12-10 DIAGNOSIS — I4891 Unspecified atrial fibrillation: Secondary | ICD-10-CM

## 2016-12-10 DIAGNOSIS — I25810 Atherosclerosis of coronary artery bypass graft(s) without angina pectoris: Secondary | ICD-10-CM

## 2016-12-10 DIAGNOSIS — Z5181 Encounter for therapeutic drug level monitoring: Secondary | ICD-10-CM

## 2016-12-10 LAB — POCT INR: INR: 2.7

## 2016-12-10 NOTE — Telephone Encounter (Signed)
Continues from previous message  Reports episode of lightheadedness and dizziness on the left side would to to get carotids updated, also requested detailed information about CHF clinic  Per vo Dr Aundra Dubin ok to update carotids  Order placed. Office phone number, after hours instructions, address and parking information all provided to patient.

## 2016-12-10 NOTE — Telephone Encounter (Signed)
PATIENT CALLED WITH MULTIPLE CONCERNS REGARDING CHANGING OFFICES, LACK OF FOLLOW UP, AND DIZZINESS  PATIENT WAS TOLD HE SHOULD HAVE A CAROTID DONE EVERY 6 MONTHS AND HE REPORTS IT HAS BEEN 12 MONTHS.

## 2016-12-11 ENCOUNTER — Other Ambulatory Visit (HOSPITAL_COMMUNITY): Payer: Self-pay | Admitting: Cardiology

## 2016-12-11 DIAGNOSIS — I6523 Occlusion and stenosis of bilateral carotid arteries: Secondary | ICD-10-CM

## 2016-12-23 ENCOUNTER — Ambulatory Visit (INDEPENDENT_AMBULATORY_CARE_PROVIDER_SITE_OTHER)
Admission: RE | Admit: 2016-12-23 | Discharge: 2016-12-23 | Disposition: A | Discharge status: Home / Self Care | Payer: Medicare Other | Source: Ambulatory Visit | Attending: Pulmonary Disease | Admitting: Pulmonary Disease

## 2016-12-23 ENCOUNTER — Ambulatory Visit (HOSPITAL_COMMUNITY)
Admission: RE | Admit: 2016-12-23 | Discharge: 2016-12-23 | Disposition: A | Discharge status: Home / Self Care | Payer: Medicare Other | Source: Ambulatory Visit | Attending: Cardiovascular Disease | Admitting: Cardiovascular Disease

## 2016-12-23 DIAGNOSIS — R918 Other nonspecific abnormal finding of lung field: Secondary | ICD-10-CM

## 2016-12-23 DIAGNOSIS — I6523 Occlusion and stenosis of bilateral carotid arteries: Secondary | ICD-10-CM | POA: Insufficient documentation

## 2016-12-23 DIAGNOSIS — R911 Solitary pulmonary nodule: Secondary | ICD-10-CM | POA: Diagnosis not present

## 2016-12-28 ENCOUNTER — Ambulatory Visit (HOSPITAL_COMMUNITY)
Admission: RE | Admit: 2016-12-28 | Discharge: 2016-12-28 | Disposition: A | Discharge status: Home / Self Care | Payer: Medicare Other | Source: Ambulatory Visit | Attending: Cardiology | Admitting: Cardiology

## 2016-12-28 ENCOUNTER — Encounter (HOSPITAL_COMMUNITY): Payer: Self-pay

## 2016-12-28 VITALS — BP 157/70 | HR 73 | Wt 202.8 lb

## 2016-12-28 DIAGNOSIS — Z953 Presence of xenogenic heart valve: Secondary | ICD-10-CM | POA: Diagnosis not present

## 2016-12-28 DIAGNOSIS — M791 Myalgia: Secondary | ICD-10-CM | POA: Diagnosis not present

## 2016-12-28 DIAGNOSIS — Z7901 Long term (current) use of anticoagulants: Secondary | ICD-10-CM | POA: Insufficient documentation

## 2016-12-28 DIAGNOSIS — I2510 Atherosclerotic heart disease of native coronary artery without angina pectoris: Secondary | ICD-10-CM | POA: Diagnosis not present

## 2016-12-28 DIAGNOSIS — Z79899 Other long term (current) drug therapy: Secondary | ICD-10-CM | POA: Insufficient documentation

## 2016-12-28 DIAGNOSIS — E785 Hyperlipidemia, unspecified: Secondary | ICD-10-CM | POA: Insufficient documentation

## 2016-12-28 DIAGNOSIS — Z951 Presence of aortocoronary bypass graft: Secondary | ICD-10-CM | POA: Insufficient documentation

## 2016-12-28 DIAGNOSIS — E1151 Type 2 diabetes mellitus with diabetic peripheral angiopathy without gangrene: Secondary | ICD-10-CM | POA: Diagnosis not present

## 2016-12-28 DIAGNOSIS — I714 Abdominal aortic aneurysm, without rupture: Secondary | ICD-10-CM | POA: Insufficient documentation

## 2016-12-28 DIAGNOSIS — I6521 Occlusion and stenosis of right carotid artery: Secondary | ICD-10-CM | POA: Insufficient documentation

## 2016-12-28 DIAGNOSIS — Z9889 Other specified postprocedural states: Secondary | ICD-10-CM | POA: Diagnosis not present

## 2016-12-28 DIAGNOSIS — I5022 Chronic systolic (congestive) heart failure: Secondary | ICD-10-CM

## 2016-12-28 DIAGNOSIS — I480 Paroxysmal atrial fibrillation: Secondary | ICD-10-CM | POA: Diagnosis not present

## 2016-12-28 DIAGNOSIS — I4891 Unspecified atrial fibrillation: Secondary | ICD-10-CM

## 2016-12-28 DIAGNOSIS — I5032 Chronic diastolic (congestive) heart failure: Secondary | ICD-10-CM | POA: Diagnosis not present

## 2016-12-28 DIAGNOSIS — I25810 Atherosclerosis of coronary artery bypass graft(s) without angina pectoris: Secondary | ICD-10-CM | POA: Diagnosis not present

## 2016-12-28 DIAGNOSIS — I110 Hypertensive heart disease with heart failure: Secondary | ICD-10-CM | POA: Diagnosis not present

## 2016-12-28 MED ORDER — RAMIPRIL 5 MG PO CAPS: ORAL_CAPSULE | ORAL | 1 refills | Status: DC

## 2016-12-28 MED ORDER — RAMIPRIL 2.5 MG PO CAPS: ORAL_CAPSULE | ORAL | 3 refills | Status: DC

## 2016-12-28 NOTE — Patient Instructions (Signed)
Increase Ramipril to 7.'5mg'$  daily.  Your provider requests you have an ECHO before your follow up.  Labs in 2 weeks  Follow up with Dr.McLean in 6 months

## 2016-12-29 NOTE — Progress Notes (Signed)
Patient ID: JESSTIN STUDSTILL, male   DOB: 1932-05-25, 83 y.o.   MRN: [id number] PCP: Dr. Elyse Hsu Cardiology: Dr. Aundra Dubin  83 yo with history of extensive vascular disease including CAD s/p CABG and redo CABG, AAA s/p repair, PAD, and carotid stenosis as well as prior AVR presents for cardiology followup.   He has had documentation of significant peripheral arterial disease.  He gets bilateral calf soreness after walking for about 15 minutes.  This resolves with rest, no rest pain.  No pedal ulcers, though when he gets cuts on his feet, they heal slowly.  Peripheral arterial dopplers done in 10/14 showed > 50% focal SFA stenoses bilaterally.  He has at least moderate right common carotid stenosis that may be nearing surgical range.  No stroke-like symptoms.  He had a recent endovascular AAA repair in 5/15.    After his AAA repair, he was noted to be in atrial fibrillation.  He had had a prior episode of atrial fibrillation after his cardiac surgery in 2007.  Atrial fibrillation was rate-controlled.  He was started on warfarin and Toprol XL.  He developed exertional dyspnea and fatigue in atrial fibrillation. I cardioverted him back to NSR on 06/06/14.  I also had him get an echocardiogram in 6/15.  This showed EF worsened to 25-30% with stable bioprosthetic aortic valve, mildly dilated RV with normal systolic function.  Given the fall in EF, I took him for [facility] in 7/15.  This showed stable anatomy.  The LIMA-LAD was patent, the sequential SVG-OM/ramus/D was patent only to ramus but this is the same as the prior cath, and the SVG-PDA was patent with a long 50-60% mid-graft stenosis.  No intervention.  Of note, at cath he was noted to be back in atrial fibrillation.  I then started him on amiodarone.  I took Mr Kretschmer for [place] again on 08/21/14.  This was successful and he remains in NSR today.  He has been off amiodarone due to side effects (hallucinations).  Echo in 1/16 showed recovery of EF to 55-60%.    Mr Magowan has  been stable since last appointment.  No chest pain.  He working out with [employer] in the gym and doing more walking.  He golfs twice a week.  No dyspnea walking.  Rare claudication.  No PND.  Sleeps on 3 pillows long-term. No palpitations.  He is in NSR today.  He is off Toprol XL due to fatigue.  Still has some generalized fatigue.  Weight is stable. BP running higher recently, SBP in 150s.   ECG: NSR, old ASMI, inferior and anterolateral TWIs.   Labs (5/15): K 4.3, creatinine 0.89 Labs (6/15): K 4.3, creatinine 1.4, BNP 112 Labs (7/15): K 4.4, creatinine 1.2, HCT 40.2 Labs (8/15): K 4, creatinine 1.2, BNP 113, LFTs and TSH normal Labs (10/15): K 4.4, creatinine 1.4 Labs (5/16): K 4.7, creatinine 1.18, HDL 52, LDL 98 Labs (6/16): LDL 118, LFTs normal Labs (1/17): HCT 40.2, K 3.7, creatinine 1.04  PMH: 1. PAD: Peripheral arterial dopplers in 2012 showed > 75% bilateral SFA stenosis. Peripheral arterial dopplers in 10/14 showed > 50% focal bilateral SFA stenoses.  2. AAA: [place] 4/15 with 4.4 cm AAA but concern for penetrating ulcers. CTA abdomen showed 4.4 cm AAA with penetrating ulcers and possible pseudoaneurysms. Now s/p endovascular repair of AAA in 2/53 with no complications.  3. Carotid stenosis: Carotid dopplers (4/15) with 60-79% bilateral ICA stenosis, RICA may be near surgical range. Carotid dopplers (11/15) with 60-79%  bilateral ICA stenosis.  Carotid dopplers 6/16 with 60-79% bilateral ICA stenosis. - Carotid dopplers (1/18): 60-79% RICA stenosis.  4. CAD: CABG 1989 with LIMA-LAD, SVG-D, seq SVG-ramus and OM, sequential SVG-PDA/PLV. SVG-PDA/PLV found to be occluded on cath prior to AVR, so patient had SVG-PDA with AVR in 2007. LHC (7/15) with LIMA-LAD patent with 40-50% stenosis in LAD after touchdown, sequential SVG-ramus/OM/diagonal with only the ramus branch still intact (known from prior cath), SVG-PDA from original surgery TO, SVG-PDA from redo surgery with long 50-60% mid-graft  stenosis.  No target for intervention.  5. Severe aortic stenosis: Bioprosthetic AVR in 2007. Echo (11/10) with EF 60-65%, basal to mid inferior hypokinesis, bioprosthetic aortic valve without significant stenosis/regurgitation, mild MR. Echo (11/14) with EF 55-60%, mild LVH, bioprosthetic aortic valve looked ok. Bioprosthetic valve looked ok on 6/15 echo.   6. Atrial fibrillation: Paroxysmal. Initially noted after cardiac surgery, then again after AAA repair.  DCCV to NSR 06/06/14.  DCCV to NSR again 08/21/14.  7. Type II diabetes  8. Gout  9. GERD  10. Hyperlipidemia  11. HTN 12. GERD  13. Chronic systolic CHF: Echo (9/24) with EF worsened to 25-30% with grade II diastolic dysfunction, stable bioprosthetic aortic valve, mildly dilated RV with normal systolic function.  Echo (1/16) with EF 55-60%, basal inferior hypokinesis, mild LVH, bioprosthetic aortic valve with mean gradient 13 mmHg, mild MR, severe LAE.   SH: Widower, 2 children, raised his 3 grand-daughters, lives in [place], [place].  Occasional ETOH, no smoking.   FH: Father died from ruptured AAA.   ROS: All systems reviewed and negative except as per HPI.   Current Outpatient Prescriptions  Medication Sig Dispense Refill  . allopurinol (ZYLOPRIM) 100 MG tablet Take 100 mg by mouth daily.      Marland Kitchen atorvastatin (LIPITOR) 10 MG tablet Take 1 tablet (10 mg total) by mouth daily. 30 tablet 6  . atorvastatin (LIPITOR) 20 MG tablet TAKE ONE TABLET EACH DAY 30 tablet 5  . beta carotene w/minerals (OCUVITE) tablet Take 1 tablet by mouth daily.    . Coenzyme Q10 (CO Q 10) 100 MG CAPS Take 100 mg by mouth daily.     Marland Kitchen doxazosin (CARDURA) 1 MG tablet Take 1-2 mg by mouth 2 (two) times daily. Take 2 tablets in the morning and take 1 tablet in the evening    . furosemide (LASIX) 20 MG tablet Take 20 mg by mouth alternating with 40 mg by mouth in the morning. Please call and schedule an appointment 15 tablet 0  . furosemide (LASIX) 40 MG  tablet Take '40mg'$  by mouth alternating with 20 mg by mouth in the morning. Please call and schedule an appointment 15 tablet 0  . glucosamine-chondroitin 500-400 MG tablet Take 1 tablet by mouth daily.    . Omega-3 Fatty Acids (FISH OIL) 1000 MG CAPS Take 1,000-2,000 mg by mouth 2 (two) times daily. Take 2000 mg in the morning and 1000 mg in the evening    . ramipril (ALTACE) 5 MG capsule Take one capsule daily with Ramipril 2.'5mg'$  tablet daily (total dose 7.'5mg'$  daily) 90 capsule 1  . sitaGLIPtin (JANUVIA) 100 MG tablet Take 100 mg by mouth daily.    Marland Kitchen spironolactone (ALDACTONE) 25 MG tablet TAKE ONE TABLET EACH DAY 30 tablet 4  . warfarin (COUMADIN) 3 MG tablet TAKE AS DIRECTED BY COUMADIN CLINIC 70 tablet 3  . ZETIA 10 MG tablet Take 10 mg by mouth daily.     . ramipril (ALTACE) 2.5  MG capsule Take one cap daily with Ramipril '5mg'$  cap daily (total dose 7.'5mg'$  daily) 90 capsule 3   No current facility-administered medications for this encounter.     BP (!) 157/70   Pulse 73   Wt 202 lb 12 oz (92 kg)   SpO2 99%   BMI 29.09 kg/m  General: NAD Neck: JVP 7 cm, no thyromegaly or thyroid nodule.  Lungs: Clear to auscultation bilaterally with normal respiratory effort. CV: Nondisplaced PMI.  Heart regular S1/S2, no S3/S4, 2/6 early SEM RUSB.  No edema.  Soft right carotid bruit.  1+ left PT, unable to feel right PT.   Abdomen: Soft, nontender, no hepatosplenomegaly, no distention. Ventral hernia.  Skin: Intact without lesions or rashes.  Neurologic: Alert and oriented x 3.  Psych: Normal affect. Extremities: No clubbing or cyanosis.   Assessment/Plan:  1. CAD: s/p CABG and redo CABG.  Given fall in EF, I did a cardiac cath in 7/15 as documented above. Unfortunately, despite significant coronary disease, there were no good interventional targets. - Continue statin, ramipril, Toprol XL.   - He is on warfarin in setting of stable CAD so not on ASA 81.  2. Hyperlipidemia: He is unable to tolerate  more than 20 mg atorvastatin due to myalgias.  He is now on Zetia.  Labs were done recently by Dr Altheimer (within the last month), will call for a copy.   3. PAD: Patient has moderate stable claudication symptoms.  No rest symptoms or foot ulcers. Peripheral arterial dopplers showed > 50% bilateral focal SFA stenoses in the past.  He is not a cilostazol candidate with CHF.  Symptoms curently seem minimal. Symptoms have been stable and is now followed by [facility], to see Dr Trula Slade soon.  4. Carotid stenosis: Moderate RICA stenosis.  He has had no stroke-like symptoms.  I will get repeat carotid dopplers in 1/19. 5. AAA: Successful endovascular repair in 5/15.  6. Bioprosthetic AVR: Valve stable on 1/16 echo.  Repeat echo to follow EF and valve.  7. Atrial fibrillation: He has been cardioverted twice and is now holding NSR.  He is on warfarin.  He has symptomatic fatigue and dyspnea while in atrial fibrillation, so ideally we would keep him out.  He is now off amiodarone given possible CNS side effects.   - If he has atrial fibrillation recurrence, will need to consider ablation versus Tikosyn.  He is very symptomatic in atrial fibrillation.  8. Chronic diastolic CHF: Suspect ischemic cardiomyopathy.  EF 25-30% in 6/15 but had normalized to 55-60% by 1/16.  NYHA class II symptoms now, stable.  He does not look volume overloaded today.  - Continue Lasix 40 mg daily alternating with 20 mg daily.   Will get recent BMET form PCP. - Continue ramipril, spironolactone.  Off Toprol XL due to fatigue.  Can stay off if EF is ok.  - Repeat echo to follow LV function. 9. HTN: BP running high, increase ramipril to 7.5 mg daily with BMET 10 days.   Followup in 6 months if echo stable.    [name] 12/29/2016

## 2017-01-06 NOTE — Progress Notes (Signed)
Spoke with patient about follow up at office to review CT scan; pt refused follow up appointment.  Pt stated he wanted his results over the phone as he has 2 dr appointments a week and if tired of dr visits. Dr. Ashok Cordia please advise.

## 2017-01-07 ENCOUNTER — Ambulatory Visit (INDEPENDENT_AMBULATORY_CARE_PROVIDER_SITE_OTHER): Payer: Medicare Other | Admitting: *Deleted

## 2017-01-07 DIAGNOSIS — I4891 Unspecified atrial fibrillation: Secondary | ICD-10-CM | POA: Diagnosis not present

## 2017-01-07 DIAGNOSIS — I6523 Occlusion and stenosis of bilateral carotid arteries: Secondary | ICD-10-CM

## 2017-01-07 DIAGNOSIS — Z5181 Encounter for therapeutic drug level monitoring: Secondary | ICD-10-CM

## 2017-01-07 LAB — POCT INR: INR: 1.5

## 2017-01-12 ENCOUNTER — Other Ambulatory Visit (HOSPITAL_COMMUNITY): Payer: Medicare Other

## 2017-01-14 ENCOUNTER — Other Ambulatory Visit: Payer: Self-pay

## 2017-01-14 DIAGNOSIS — IMO0001 Reserved for inherently not codable concepts without codable children: Secondary | ICD-10-CM

## 2017-01-14 DIAGNOSIS — R911 Solitary pulmonary nodule: Secondary | ICD-10-CM

## 2017-01-18 ENCOUNTER — Ambulatory Visit: Payer: Medicare Other | Admitting: Pulmonary Disease

## 2017-01-21 ENCOUNTER — Ambulatory Visit (INDEPENDENT_AMBULATORY_CARE_PROVIDER_SITE_OTHER): Payer: Medicare Other | Admitting: *Deleted

## 2017-01-21 DIAGNOSIS — I4891 Unspecified atrial fibrillation: Secondary | ICD-10-CM | POA: Diagnosis not present

## 2017-01-21 DIAGNOSIS — Z5181 Encounter for therapeutic drug level monitoring: Secondary | ICD-10-CM

## 2017-01-21 DIAGNOSIS — I6523 Occlusion and stenosis of bilateral carotid arteries: Secondary | ICD-10-CM

## 2017-01-21 LAB — POCT INR: INR: 2.5

## 2017-01-26 ENCOUNTER — Ambulatory Visit: Payer: Medicare Other | Admitting: Pulmonary Disease

## 2017-01-27 ENCOUNTER — Other Ambulatory Visit (HOSPITAL_COMMUNITY): Payer: Medicare Other

## 2017-02-02 ENCOUNTER — Other Ambulatory Visit (HOSPITAL_COMMUNITY): Payer: Self-pay | Admitting: Cardiology

## 2017-02-02 MED ORDER — RAMIPRIL 5 MG PO CAPS: ORAL_CAPSULE | ORAL | 3 refills | Status: DC

## 2017-02-02 MED ORDER — RAMIPRIL 2.5 MG PO CAPS: ORAL_CAPSULE | ORAL | 3 refills | Status: DC

## 2017-02-03 ENCOUNTER — Ambulatory Visit (HOSPITAL_COMMUNITY)
Admission: RE | Admit: 2017-02-03 | Discharge: 2017-02-03 | Disposition: A | Discharge status: Home / Self Care | Payer: Medicare Other | Source: Ambulatory Visit | Attending: Cardiology | Admitting: Cardiology

## 2017-02-03 DIAGNOSIS — I5022 Chronic systolic (congestive) heart failure: Secondary | ICD-10-CM | POA: Diagnosis not present

## 2017-02-03 DIAGNOSIS — I5032 Chronic diastolic (congestive) heart failure: Secondary | ICD-10-CM | POA: Diagnosis not present

## 2017-02-03 DIAGNOSIS — I340 Nonrheumatic mitral (valve) insufficiency: Secondary | ICD-10-CM | POA: Insufficient documentation

## 2017-02-03 DIAGNOSIS — Z953 Presence of xenogenic heart valve: Secondary | ICD-10-CM | POA: Diagnosis not present

## 2017-02-03 DIAGNOSIS — I501 Left ventricular failure: Secondary | ICD-10-CM | POA: Insufficient documentation

## 2017-02-03 NOTE — Progress Notes (Signed)
  Echocardiogram 2D Echocardiogram has been performed.  Nathaniel [name] 02/03/2017, 10:01 AM

## 2017-02-04 DIAGNOSIS — H401122 Primary open-angle glaucoma, left eye, moderate stage: Secondary | ICD-10-CM | POA: Diagnosis not present

## 2017-02-04 DIAGNOSIS — H53412 Scotoma involving central area, left eye: Secondary | ICD-10-CM | POA: Diagnosis not present

## 2017-02-04 DIAGNOSIS — H35372 Puckering of macula, left eye: Secondary | ICD-10-CM | POA: Diagnosis not present

## 2017-02-04 DIAGNOSIS — H53432 Sector or arcuate defects, left eye: Secondary | ICD-10-CM | POA: Diagnosis not present

## 2017-02-04 DIAGNOSIS — E119 Type 2 diabetes mellitus without complications: Secondary | ICD-10-CM | POA: Diagnosis not present

## 2017-02-04 DIAGNOSIS — H40011 Open angle with borderline findings, low risk, right eye: Secondary | ICD-10-CM | POA: Diagnosis not present

## 2017-02-05 ENCOUNTER — Other Ambulatory Visit: Payer: Self-pay | Admitting: Cardiology

## 2017-02-11 ENCOUNTER — Ambulatory Visit (INDEPENDENT_AMBULATORY_CARE_PROVIDER_SITE_OTHER): Payer: Medicare Other | Admitting: *Deleted

## 2017-02-11 DIAGNOSIS — Z5181 Encounter for therapeutic drug level monitoring: Secondary | ICD-10-CM

## 2017-02-11 DIAGNOSIS — I6523 Occlusion and stenosis of bilateral carotid arteries: Secondary | ICD-10-CM

## 2017-02-11 LAB — POCT INR: INR: 2.9

## 2017-02-12 ENCOUNTER — Encounter: Payer: Self-pay | Admitting: Surgery

## 2017-02-15 ENCOUNTER — Ambulatory Visit: Payer: Medicare Other | Admitting: Surgery

## 2017-02-15 ENCOUNTER — Ambulatory Visit (HOSPITAL_COMMUNITY)
Admission: RE | Admit: 2017-02-15 | Discharge: 2017-02-15 | Disposition: A | Discharge status: Home / Self Care | Payer: Medicare Other | Source: Ambulatory Visit | Attending: Surgery | Admitting: Surgery

## 2017-02-15 DIAGNOSIS — Z95828 Presence of other vascular implants and grafts: Secondary | ICD-10-CM | POA: Diagnosis not present

## 2017-02-15 DIAGNOSIS — Z48812 Encounter for surgical aftercare following surgery on the circulatory system: Secondary | ICD-10-CM

## 2017-02-15 DIAGNOSIS — I714 Abdominal aortic aneurysm, without rupture, unspecified: Secondary | ICD-10-CM

## 2017-02-18 DIAGNOSIS — I5042 Chronic combined systolic (congestive) and diastolic (congestive) heart failure: Secondary | ICD-10-CM | POA: Diagnosis not present

## 2017-02-18 DIAGNOSIS — E1151 Type 2 diabetes mellitus with diabetic peripheral angiopathy without gangrene: Secondary | ICD-10-CM | POA: Diagnosis not present

## 2017-02-18 DIAGNOSIS — N183 Chronic kidney disease, stage 3 (moderate): Secondary | ICD-10-CM | POA: Diagnosis not present

## 2017-02-18 DIAGNOSIS — I130 Hypertensive heart and chronic kidney disease with heart failure and stage 1 through stage 4 chronic kidney disease, or unspecified chronic kidney disease: Secondary | ICD-10-CM | POA: Diagnosis not present

## 2017-02-18 DIAGNOSIS — Z9861 Coronary angioplasty status: Secondary | ICD-10-CM | POA: Diagnosis not present

## 2017-02-18 DIAGNOSIS — I6522 Occlusion and stenosis of left carotid artery: Secondary | ICD-10-CM | POA: Diagnosis not present

## 2017-02-18 DIAGNOSIS — E782 Mixed hyperlipidemia: Secondary | ICD-10-CM | POA: Diagnosis not present

## 2017-02-18 DIAGNOSIS — Z7984 Long term (current) use of oral hypoglycemic drugs: Secondary | ICD-10-CM | POA: Diagnosis not present

## 2017-02-18 DIAGNOSIS — I2510 Atherosclerotic heart disease of native coronary artery without angina pectoris: Secondary | ICD-10-CM | POA: Diagnosis not present

## 2017-02-18 DIAGNOSIS — E1122 Type 2 diabetes mellitus with diabetic chronic kidney disease: Secondary | ICD-10-CM | POA: Diagnosis not present

## 2017-02-18 DIAGNOSIS — Z953 Presence of xenogenic heart valve: Secondary | ICD-10-CM | POA: Diagnosis not present

## 2017-02-22 ENCOUNTER — Encounter: Payer: Self-pay | Admitting: Surgery

## 2017-02-22 ENCOUNTER — Ambulatory Visit (INDEPENDENT_AMBULATORY_CARE_PROVIDER_SITE_OTHER): Payer: Medicare Other | Admitting: Surgery

## 2017-02-22 VITALS — BP 135/71 | HR 64 | Temp 97.6°F | Resp 20 | Ht 70.0 in | Wt 205.5 lb

## 2017-02-22 DIAGNOSIS — I714 Abdominal aortic aneurysm, without rupture, unspecified: Secondary | ICD-10-CM

## 2017-02-22 DIAGNOSIS — I6523 Occlusion and stenosis of bilateral carotid arteries: Secondary | ICD-10-CM | POA: Diagnosis not present

## 2017-02-22 NOTE — Progress Notes (Signed)
[facility] [place]  Patient name: PINCHAS REITHER MRN: [id number] DOB: [birth year] Sex: male   REASON FOR VISIT:    Follow up AAA  HISOTRY OF PRESENT ILLNESS:    JAASIEL HOLLYFIELD is a 83 y.o. male who is status post endovascular repair of an abdominal aortic aneurysm on 04/13/2014.  His postoperative course was complicated by atrial fibrillation, requiring anticoagulation.  Follow-up CT angiogram reveals the stent graft to be in good position with no evidence of endoleak.  I have been following him with ultrasound.  Since I last saw him, he has not had any major medical issues.  Today, he denies any abdominal pain or back pain.  He does not endorse claudication symptoms or neurologic symptoms.   PAST MEDICAL HISTORY:   Past Medical History:  Diagnosis Date  . Atrial fibrillation ([place])   . Atrial fibrillation ([place])   . CAD (coronary artery disease)   . Cataract   . CHF (congestive heart failure) ([place])   . Colon polyps    adenomatous  . Diabetes mellitus   . Diverticulosis   . GERD (gastroesophageal reflux disease)   . Gout   . Hernia, abdominal   . Hx: UTI (urinary tract infection)   . Hyperlipemia   . Hypertension   . Internal hemorrhoids   . Peripheral vascular disease ([place])   . Shortness of breath      FAMILY HISTORY:   Family History  Problem Relation Age of Onset  . Heart disease Mother   . Heart disease Father     before age 16  . AAA (abdominal aortic aneurysm) Father   . Colon cancer Neg Hx     SOCIAL HISTORY:   Social History  Substance Use Topics  . Smoking status: Former Smoker    Packs/day: 0.25    Years: 20.00    Types: Cigarettes    Quit date: 03/23/1992  . Smokeless tobacco: Never Used  . Alcohol use 6.0 oz/week    10 Glasses of wine per week     Comment: 1-2 drinks daily      ALLERGIES:   No Known Allergies   CURRENT MEDICATIONS:   Current Outpatient Prescriptions    Medication Sig Dispense Refill  . allopurinol (ZYLOPRIM) 100 MG tablet Take 100 mg by mouth daily.      Marland Kitchen atorvastatin (LIPITOR) 10 MG tablet Take 1 tablet (10 mg total) by mouth daily. 30 tablet 6  . atorvastatin (LIPITOR) 20 MG tablet TAKE ONE TABLET EACH DAY 30 tablet 5  . beta carotene w/minerals (OCUVITE) tablet Take 1 tablet by mouth daily.    . Coenzyme Q10 (CO Q 10) 100 MG CAPS Take 100 mg by mouth daily.     Marland Kitchen doxazosin (CARDURA) 1 MG tablet Take 1-2 mg by mouth 2 (two) times daily. Take 2 tablets in the morning and take 1 tablet in the evening    . furosemide (LASIX) 20 MG tablet TAKE ONE TABLET ALTERNATING WITH '40MG'$  INTHE MORNING 15 tablet 11  . furosemide (LASIX) 40 MG tablet Take '40mg'$  by mouth alternating with 20 mg by mouth in the morning. Please call and schedule an appointment 15 tablet 0  . glucosamine-chondroitin 500-400 MG tablet Take 1 tablet by mouth daily.    . Omega-3 Fatty Acids (FISH OIL) 1000 MG CAPS Take 1,000-2,000 mg by mouth 2 (two) times daily. Take 2000 mg in the morning and 1000 mg in the evening    . ramipril (ALTACE)  2.5 MG capsule Take one cap daily with Ramipril '5mg'$  cap daily (total dose 7.'5mg'$  daily) 90 capsule 3  . ramipril (ALTACE) 5 MG capsule Take one capsule daily with Ramipril 2.'5mg'$  tablet daily (total dose 7.'5mg'$  daily) 90 capsule 3  . sitaGLIPtin (JANUVIA) 100 MG tablet Take 100 mg by mouth daily.    Marland Kitchen spironolactone (ALDACTONE) 25 MG tablet TAKE ONE TABLET EACH DAY 30 tablet 4  . warfarin (COUMADIN) 3 MG tablet TAKE AS DIRECTED BY COUMADIN CLINIC 70 tablet 3  . ZETIA 10 MG tablet Take 10 mg by mouth daily.      No current facility-administered medications for this visit.     REVIEW OF SYSTEMS:   '[X]'$  denotes positive finding, '[ ]'$  denotes negative finding Cardiac  Comments:  Chest pain or chest pressure:    Shortness of breath upon exertion:    Short of breath when lying flat:    Irregular heart rhythm:        Vascular    Pain in calf,  thigh, or hip brought on by ambulation:    Pain in feet at night that wakes you up from your sleep:     Blood clot in your veins:    Leg swelling:         Pulmonary    Oxygen at home:    Productive cough:     Wheezing:         Neurologic    Sudden weakness in arms or legs:     Sudden numbness in arms or legs:     Sudden onset of difficulty speaking or slurred speech:    Temporary loss of vision in one eye:     Problems with dizziness:         Gastrointestinal    Blood in stool:     Vomited blood:         Genitourinary    Burning when urinating:     Blood in urine:        Psychiatric    Major depression:         Hematologic    Bleeding problems:    Problems with blood clotting too easily:        Skin    Rashes or ulcers:        Constitutional    Fever or chills:      PHYSICAL EXAM:   Vitals:   02/22/17 0838  BP: 135/71  Pulse: 64  Resp: 20  Temp: 97.6 F (36.4 C)  TempSrc: Oral  SpO2: 96%  Weight: 205 lb 8 oz (93.2 kg)  Height: '5\' 10"'$  (1.778 m)    GENERAL: The patient is a well-nourished male, in no acute distress. The vital signs are documented above. CARDIAC: There is a regular rate and rhythm.  PULMONARY: Non-labored respirations ABDOMEN: Soft and non-tender with normal pitched bowel sounds.  MUSCULOSKELETAL: There are no major deformities or cyanosis. NEUROLOGIC: No focal weakness or paresthesias are detected. SKIN: There are no ulcers or rashes noted. PSYCHIATRIC: The patient has a normal affect.  STUDIES:   I have ordered and reviewed his abdominal ultrasound.  This shows the stent graft to be in good position with no evidence of endoleak and maximum diameter 4.1 cm.  MEDICAL ISSUES:   Status post endovascular aneurysm: The patient continues to do very well.  Aneurysm sac is getting smaller.  I will have her follow-up in one year for surveillance ultrasound.    Annamarie Major, MD [facility] [phone] Pager [phone]

## 2017-03-18 ENCOUNTER — Ambulatory Visit (INDEPENDENT_AMBULATORY_CARE_PROVIDER_SITE_OTHER): Payer: Medicare Other | Admitting: Pharmacist

## 2017-03-18 DIAGNOSIS — I6523 Occlusion and stenosis of bilateral carotid arteries: Secondary | ICD-10-CM

## 2017-03-18 DIAGNOSIS — Z5181 Encounter for therapeutic drug level monitoring: Secondary | ICD-10-CM | POA: Diagnosis not present

## 2017-03-18 LAB — POCT INR: INR: 3.2

## 2017-04-13 DIAGNOSIS — N401 Enlarged prostate with lower urinary tract symptoms: Secondary | ICD-10-CM | POA: Diagnosis not present

## 2017-04-13 DIAGNOSIS — N138 Other obstructive and reflux uropathy: Secondary | ICD-10-CM | POA: Diagnosis not present

## 2017-04-15 ENCOUNTER — Other Ambulatory Visit: Payer: Self-pay | Admitting: Cardiology

## 2017-04-16 ENCOUNTER — Ambulatory Visit (INDEPENDENT_AMBULATORY_CARE_PROVIDER_SITE_OTHER): Payer: Medicare Other | Admitting: *Deleted

## 2017-04-16 DIAGNOSIS — Z5181 Encounter for therapeutic drug level monitoring: Secondary | ICD-10-CM

## 2017-04-16 DIAGNOSIS — I6523 Occlusion and stenosis of bilateral carotid arteries: Secondary | ICD-10-CM | POA: Diagnosis not present

## 2017-04-16 DIAGNOSIS — I4891 Unspecified atrial fibrillation: Secondary | ICD-10-CM | POA: Diagnosis not present

## 2017-04-16 LAB — POCT INR: INR: 1.9

## 2017-05-13 ENCOUNTER — Ambulatory Visit (INDEPENDENT_AMBULATORY_CARE_PROVIDER_SITE_OTHER): Payer: Medicare Other | Admitting: *Deleted

## 2017-05-13 DIAGNOSIS — Z5181 Encounter for therapeutic drug level monitoring: Secondary | ICD-10-CM | POA: Diagnosis not present

## 2017-05-13 DIAGNOSIS — I6523 Occlusion and stenosis of bilateral carotid arteries: Secondary | ICD-10-CM | POA: Diagnosis not present

## 2017-05-13 DIAGNOSIS — I4891 Unspecified atrial fibrillation: Secondary | ICD-10-CM

## 2017-05-13 LAB — POCT INR: INR: 3

## 2017-05-21 DIAGNOSIS — E1165 Type 2 diabetes mellitus with hyperglycemia: Secondary | ICD-10-CM | POA: Diagnosis not present

## 2017-05-21 DIAGNOSIS — E782 Mixed hyperlipidemia: Secondary | ICD-10-CM | POA: Diagnosis not present

## 2017-05-25 DIAGNOSIS — E1159 Type 2 diabetes mellitus with other circulatory complications: Secondary | ICD-10-CM | POA: Diagnosis not present

## 2017-05-25 DIAGNOSIS — E782 Mixed hyperlipidemia: Secondary | ICD-10-CM | POA: Diagnosis not present

## 2017-05-25 DIAGNOSIS — E1165 Type 2 diabetes mellitus with hyperglycemia: Secondary | ICD-10-CM | POA: Diagnosis not present

## 2017-05-25 DIAGNOSIS — N183 Chronic kidney disease, stage 3 (moderate): Secondary | ICD-10-CM | POA: Diagnosis not present

## 2017-05-25 DIAGNOSIS — Z7984 Long term (current) use of oral hypoglycemic drugs: Secondary | ICD-10-CM | POA: Diagnosis not present

## 2017-05-25 DIAGNOSIS — M109 Gout, unspecified: Secondary | ICD-10-CM | POA: Diagnosis not present

## 2017-05-25 DIAGNOSIS — I129 Hypertensive chronic kidney disease with stage 1 through stage 4 chronic kidney disease, or unspecified chronic kidney disease: Secondary | ICD-10-CM | POA: Diagnosis not present

## 2017-05-25 DIAGNOSIS — E1151 Type 2 diabetes mellitus with diabetic peripheral angiopathy without gangrene: Secondary | ICD-10-CM | POA: Diagnosis not present

## 2017-05-25 DIAGNOSIS — I2510 Atherosclerotic heart disease of native coronary artery without angina pectoris: Secondary | ICD-10-CM | POA: Diagnosis not present

## 2017-05-25 DIAGNOSIS — N139 Obstructive and reflux uropathy, unspecified: Secondary | ICD-10-CM | POA: Diagnosis not present

## 2017-06-06 DIAGNOSIS — N390 Urinary tract infection, site not specified: Secondary | ICD-10-CM | POA: Diagnosis not present

## 2017-06-10 ENCOUNTER — Ambulatory Visit (INDEPENDENT_AMBULATORY_CARE_PROVIDER_SITE_OTHER): Payer: Medicare Other | Admitting: *Deleted

## 2017-06-10 DIAGNOSIS — I4891 Unspecified atrial fibrillation: Secondary | ICD-10-CM | POA: Diagnosis not present

## 2017-06-10 DIAGNOSIS — I6523 Occlusion and stenosis of bilateral carotid arteries: Secondary | ICD-10-CM

## 2017-06-10 DIAGNOSIS — Z5181 Encounter for therapeutic drug level monitoring: Secondary | ICD-10-CM | POA: Diagnosis not present

## 2017-06-10 LAB — POCT INR: INR: 2.3

## 2017-06-21 ENCOUNTER — Other Ambulatory Visit: Payer: Self-pay | Admitting: Cardiology

## 2017-06-21 DIAGNOSIS — I779 Disorder of arteries and arterioles, unspecified: Secondary | ICD-10-CM

## 2017-06-21 DIAGNOSIS — I480 Paroxysmal atrial fibrillation: Secondary | ICD-10-CM

## 2017-06-21 DIAGNOSIS — I5022 Chronic systolic (congestive) heart failure: Secondary | ICD-10-CM

## 2017-06-21 DIAGNOSIS — I739 Peripheral vascular disease, unspecified: Secondary | ICD-10-CM

## 2017-06-25 ENCOUNTER — Other Ambulatory Visit: Payer: Self-pay | Admitting: Cardiology

## 2017-07-08 ENCOUNTER — Ambulatory Visit (INDEPENDENT_AMBULATORY_CARE_PROVIDER_SITE_OTHER): Payer: Medicare Other | Admitting: *Deleted

## 2017-07-08 DIAGNOSIS — I6523 Occlusion and stenosis of bilateral carotid arteries: Secondary | ICD-10-CM | POA: Diagnosis not present

## 2017-07-08 DIAGNOSIS — Z5181 Encounter for therapeutic drug level monitoring: Secondary | ICD-10-CM

## 2017-07-08 DIAGNOSIS — I4891 Unspecified atrial fibrillation: Secondary | ICD-10-CM | POA: Diagnosis not present

## 2017-07-08 LAB — POCT INR: INR: 2.6

## 2017-07-15 DIAGNOSIS — N401 Enlarged prostate with lower urinary tract symptoms: Secondary | ICD-10-CM | POA: Diagnosis not present

## 2017-07-15 DIAGNOSIS — N138 Other obstructive and reflux uropathy: Secondary | ICD-10-CM | POA: Diagnosis not present

## 2017-07-19 ENCOUNTER — Other Ambulatory Visit: Payer: Self-pay | Admitting: Cardiology

## 2017-07-30 DIAGNOSIS — Z951 Presence of aortocoronary bypass graft: Secondary | ICD-10-CM | POA: Diagnosis not present

## 2017-07-30 DIAGNOSIS — Z87891 Personal history of nicotine dependence: Secondary | ICD-10-CM | POA: Diagnosis not present

## 2017-07-30 DIAGNOSIS — N323 Diverticulum of bladder: Secondary | ICD-10-CM | POA: Diagnosis not present

## 2017-07-30 DIAGNOSIS — I129 Hypertensive chronic kidney disease with stage 1 through stage 4 chronic kidney disease, or unspecified chronic kidney disease: Secondary | ICD-10-CM | POA: Diagnosis not present

## 2017-07-30 DIAGNOSIS — J309 Allergic rhinitis, unspecified: Secondary | ICD-10-CM | POA: Diagnosis not present

## 2017-07-30 DIAGNOSIS — E785 Hyperlipidemia, unspecified: Secondary | ICD-10-CM | POA: Diagnosis not present

## 2017-07-30 DIAGNOSIS — I509 Heart failure, unspecified: Secondary | ICD-10-CM | POA: Diagnosis not present

## 2017-07-30 DIAGNOSIS — N401 Enlarged prostate with lower urinary tract symptoms: Secondary | ICD-10-CM | POA: Diagnosis not present

## 2017-07-30 DIAGNOSIS — M109 Gout, unspecified: Secondary | ICD-10-CM | POA: Diagnosis not present

## 2017-07-30 DIAGNOSIS — N189 Chronic kidney disease, unspecified: Secondary | ICD-10-CM | POA: Diagnosis not present

## 2017-07-30 DIAGNOSIS — N400 Enlarged prostate without lower urinary tract symptoms: Secondary | ICD-10-CM | POA: Diagnosis not present

## 2017-07-30 DIAGNOSIS — Z01812 Encounter for preprocedural laboratory examination: Secondary | ICD-10-CM | POA: Diagnosis not present

## 2017-07-30 DIAGNOSIS — I10 Essential (primary) hypertension: Secondary | ICD-10-CM | POA: Diagnosis not present

## 2017-07-30 DIAGNOSIS — N529 Male erectile dysfunction, unspecified: Secondary | ICD-10-CM | POA: Diagnosis not present

## 2017-07-30 DIAGNOSIS — I2510 Atherosclerotic heart disease of native coronary artery without angina pectoris: Secondary | ICD-10-CM | POA: Diagnosis not present

## 2017-07-30 DIAGNOSIS — E1122 Type 2 diabetes mellitus with diabetic chronic kidney disease: Secondary | ICD-10-CM | POA: Diagnosis not present

## 2017-07-30 DIAGNOSIS — I714 Abdominal aortic aneurysm, without rupture: Secondary | ICD-10-CM | POA: Diagnosis not present

## 2017-07-30 DIAGNOSIS — N138 Other obstructive and reflux uropathy: Secondary | ICD-10-CM | POA: Diagnosis not present

## 2017-08-02 ENCOUNTER — Telehealth (HOSPITAL_COMMUNITY): Payer: Self-pay | Admitting: *Deleted

## 2017-08-02 DIAGNOSIS — I447 Left bundle-branch block, unspecified: Secondary | ICD-10-CM | POA: Diagnosis not present

## 2017-08-02 DIAGNOSIS — I493 Ventricular premature depolarization: Secondary | ICD-10-CM | POA: Diagnosis not present

## 2017-08-02 NOTE — Telephone Encounter (Signed)
Patient is scheduled for a TURP and needs to come off warfarin for procedure with Lovenox bridge.  Patient needs to start holding warfarin beginning tomorrow. Sending message to coumadin clinic to have Lovenox bridge setup.   Clearance faxed to [facility] with Dr. Claris Gladden recommendations.

## 2017-08-03 ENCOUNTER — Telehealth: Payer: Self-pay | Admitting: Pharmacist

## 2017-08-03 ENCOUNTER — Ambulatory Visit (INDEPENDENT_AMBULATORY_CARE_PROVIDER_SITE_OTHER): Payer: Medicare Other

## 2017-08-03 DIAGNOSIS — I4891 Unspecified atrial fibrillation: Secondary | ICD-10-CM

## 2017-08-03 DIAGNOSIS — Z5181 Encounter for therapeutic drug level monitoring: Secondary | ICD-10-CM | POA: Diagnosis not present

## 2017-08-03 DIAGNOSIS — I6523 Occlusion and stenosis of bilateral carotid arteries: Secondary | ICD-10-CM

## 2017-08-03 LAB — POCT INR: INR: 1.3

## 2017-08-03 NOTE — Telephone Encounter (Signed)
See anticoag note from 08/03/17 for details

## 2017-08-03 NOTE — Telephone Encounter (Signed)
Pt seen in Coumadin Clinic today, Lovenox bridging done at [place], see anticoagulation note in Epic.

## 2017-08-03 NOTE — Telephone Encounter (Signed)
New Message  Pt call requesting to speak with RN about a medication he has to start taking. Please call back to disucss

## 2017-08-03 NOTE — Patient Instructions (Addendum)
08/03/17: Inject Lovenox 100mg  in the fatty abdominal tissue at least 2 inches from the belly button twice a day about 12 hours apart, 7am and 7pm rotate sites. Start taking Lovenox 100mg  first injection at 7pm today. No Coumadin.  08/04/17: Inject Lovenox in the fatty tissue every 12 hours, 7am and 7pm. No Coumadin  08/05/17: Inject Lovenox in the fatty tissue every 12 hours, 7am and 7pm. No Coumadin  08/06/17: Inject Lovenox in the fatty tissue every 12 hours, 7am and 7pm. No Coumadin.  08/07/17: Inject Lovenox in the fatty tissue every 12 hours, 7am and 7pm. No Coumadin.  08/08/17: Inject Lovenox in the fatty tissue in the morning at 7am (No PM dose). No Coumadin.  08/09/17: Procedure Day - No Lovenox - Resume Coumadin in the evening or as directed by doctor (take an extra 1/2 tablet x 2 dosages, then resume previous dosage regimen).  08/10/17: Resume Lovenox inject in the fatty tissue every 12 hours and take Coumadin.  08/11/17: Inject Lovenox in the fatty tissue every 12 hours and take Coumadin.  08/12/17: Inject Lovenox in the fatty tissue every 12 hours and take Coumadin.  08/13/17: Inject Lovenox in the fatty tissue every 12 hours and take Coumadin.** Coumadin appt to check INR**.

## 2017-08-06 DIAGNOSIS — E119 Type 2 diabetes mellitus without complications: Secondary | ICD-10-CM | POA: Diagnosis not present

## 2017-08-06 DIAGNOSIS — H53432 Sector or arcuate defects, left eye: Secondary | ICD-10-CM | POA: Diagnosis not present

## 2017-08-06 DIAGNOSIS — H35372 Puckering of macula, left eye: Secondary | ICD-10-CM | POA: Diagnosis not present

## 2017-08-06 DIAGNOSIS — H401131 Primary open-angle glaucoma, bilateral, mild stage: Secondary | ICD-10-CM | POA: Diagnosis not present

## 2017-08-09 DIAGNOSIS — I714 Abdominal aortic aneurysm, without rupture: Secondary | ICD-10-CM | POA: Diagnosis not present

## 2017-08-09 DIAGNOSIS — E1122 Type 2 diabetes mellitus with diabetic chronic kidney disease: Secondary | ICD-10-CM | POA: Diagnosis not present

## 2017-08-09 DIAGNOSIS — N411 Chronic prostatitis: Secondary | ICD-10-CM | POA: Diagnosis not present

## 2017-08-09 DIAGNOSIS — I509 Heart failure, unspecified: Secondary | ICD-10-CM | POA: Diagnosis not present

## 2017-08-09 DIAGNOSIS — N138 Other obstructive and reflux uropathy: Secondary | ICD-10-CM | POA: Diagnosis not present

## 2017-08-09 DIAGNOSIS — N401 Enlarged prostate with lower urinary tract symptoms: Secondary | ICD-10-CM | POA: Diagnosis not present

## 2017-08-09 DIAGNOSIS — N323 Diverticulum of bladder: Secondary | ICD-10-CM | POA: Diagnosis not present

## 2017-08-10 ENCOUNTER — Encounter (HOSPITAL_COMMUNITY): Payer: Self-pay | Admitting: Emergency Medicine

## 2017-08-10 ENCOUNTER — Emergency Department (HOSPITAL_COMMUNITY)
Admission: EM | Admit: 2017-08-10 | Discharge: 2017-08-10 | Discharge status: Against Medical Advice | Payer: Medicare Other

## 2017-08-10 ENCOUNTER — Emergency Department (HOSPITAL_COMMUNITY)
Admission: EM | Admit: 2017-08-10 | Discharge: 2017-08-11 | Disposition: A | Discharge status: Home / Self Care | Payer: Medicare Other | Attending: Emergency Medicine | Admitting: Emergency Medicine

## 2017-08-10 DIAGNOSIS — N400 Enlarged prostate without lower urinary tract symptoms: Secondary | ICD-10-CM

## 2017-08-10 DIAGNOSIS — T83498A Other mechanical complication of other prosthetic devices, implants and grafts of genital tract, initial encounter: Secondary | ICD-10-CM | POA: Diagnosis not present

## 2017-08-10 DIAGNOSIS — Z9889 Other specified postprocedural states: Secondary | ICD-10-CM | POA: Insufficient documentation

## 2017-08-10 DIAGNOSIS — N401 Enlarged prostate with lower urinary tract symptoms: Secondary | ICD-10-CM | POA: Diagnosis not present

## 2017-08-10 DIAGNOSIS — N138 Other obstructive and reflux uropathy: Secondary | ICD-10-CM | POA: Diagnosis not present

## 2017-08-10 DIAGNOSIS — E1122 Type 2 diabetes mellitus with diabetic chronic kidney disease: Secondary | ICD-10-CM | POA: Diagnosis not present

## 2017-08-10 DIAGNOSIS — R339 Retention of urine, unspecified: Secondary | ICD-10-CM | POA: Diagnosis present

## 2017-08-10 DIAGNOSIS — I739 Peripheral vascular disease, unspecified: Secondary | ICD-10-CM | POA: Diagnosis not present

## 2017-08-10 DIAGNOSIS — I110 Hypertensive heart disease with heart failure: Secondary | ICD-10-CM | POA: Diagnosis not present

## 2017-08-10 DIAGNOSIS — Z79899 Other long term (current) drug therapy: Secondary | ICD-10-CM | POA: Insufficient documentation

## 2017-08-10 DIAGNOSIS — T83198A Other mechanical complication of other urinary devices and implants, initial encounter: Secondary | ICD-10-CM | POA: Diagnosis not present

## 2017-08-10 DIAGNOSIS — I509 Heart failure, unspecified: Secondary | ICD-10-CM | POA: Diagnosis not present

## 2017-08-10 DIAGNOSIS — Z87891 Personal history of nicotine dependence: Secondary | ICD-10-CM | POA: Insufficient documentation

## 2017-08-10 DIAGNOSIS — R319 Hematuria, unspecified: Secondary | ICD-10-CM | POA: Diagnosis not present

## 2017-08-10 DIAGNOSIS — I714 Abdominal aortic aneurysm, without rupture: Secondary | ICD-10-CM | POA: Diagnosis not present

## 2017-08-10 DIAGNOSIS — T83091A Other mechanical complication of indwelling urethral catheter, initial encounter: Secondary | ICD-10-CM | POA: Diagnosis not present

## 2017-08-10 DIAGNOSIS — Y732 Prosthetic and other implants, materials and accessory gastroenterology and urology devices associated with adverse incidents: Secondary | ICD-10-CM | POA: Diagnosis not present

## 2017-08-10 DIAGNOSIS — I2510 Atherosclerotic heart disease of native coronary artery without angina pectoris: Secondary | ICD-10-CM | POA: Diagnosis not present

## 2017-08-10 DIAGNOSIS — I5041 Acute combined systolic (congestive) and diastolic (congestive) heart failure: Secondary | ICD-10-CM | POA: Diagnosis not present

## 2017-08-10 DIAGNOSIS — N323 Diverticulum of bladder: Secondary | ICD-10-CM | POA: Diagnosis not present

## 2017-08-10 DIAGNOSIS — Z7901 Long term (current) use of anticoagulants: Secondary | ICD-10-CM | POA: Insufficient documentation

## 2017-08-10 DIAGNOSIS — E119 Type 2 diabetes mellitus without complications: Secondary | ICD-10-CM | POA: Diagnosis not present

## 2017-08-10 DIAGNOSIS — I4891 Unspecified atrial fibrillation: Secondary | ICD-10-CM | POA: Diagnosis not present

## 2017-08-10 NOTE — ED Triage Notes (Signed)
Patient BIB [facility] from home after leaving [facility] due to [facility] wait. Pt had prostate sx yesterday at [facility]  and c/o pain and no urinary op, with some bleeding from catheter that is in place.

## 2017-08-10 NOTE — ED Provider Notes (Signed)
Medical screening examination/treatment/procedure(s) were conducted as a shared visit with non-physician practitioner(s) and myself.  I personally evaluated the patient during the encounter.  Have Foley catheter placed yesterday secondary to a transurethral resection of prostate with persistent hematuria. Patient feels like there is an episode where he was speaking and around the catheter and nothing was coming through it however it's been working normally since he's been here. On my examination fluid will flow and flushing easily does have hematuria but does not seem to be a large amount of blood. Vital signs within normal limits. Has follow-up with his urologist tomorrow we'll defer to them further management. No evidence of a blocked Foley. We'll do a bladder scan for reassurance.   Merrily Pew, MD 08/11/17 1750

## 2017-08-10 NOTE — ED Notes (Signed)
Bed: QN99 Expected date:  Expected time:  Means of arrival:  Comments: 83 yr old hematuria

## 2017-08-10 NOTE — ED Provider Notes (Signed)
[facility] DEPT Provider Note   CSN: [id number] Arrival date & time: 08/10/17  2103     History   Chief Complaint Chief Complaint  Patient presents with  . Urinary Retention    HPI Nathaniel [name] is a 83 y.o. male.  Nathaniel [name] is an 83 year old male who is postop day 1 after a TURP procedure for [facility], who presents with decreased urinary output as well as bleeding from the catheter that is in place. Patient reports his surgery was yesterday morning, the procedure went well and he was discharged from the hospital today at about 4 PM. Patient returned home but then started to have increased pain and felt like urine was not flowing into the catheter bag he was also concerned for the dark red color. Patient and patient's granddaughter report that when they went to change from the leg bag to the larger bag no urine was running into the bag. Patient currently denies pain. Patient usually takes blood thinners for artificial valve but these were discontinued for surgery.      Past Medical History:  Diagnosis Date  . Atrial fibrillation ([place])   . Atrial fibrillation ([place])   . CAD (coronary artery disease)   . Cataract   . CHF (congestive heart failure) ([place])   . Colon polyps    adenomatous  . Diabetes mellitus   . Diverticulosis   . GERD (gastroesophageal reflux disease)   . Gout   . Hernia, abdominal   . Hx: UTI (urinary tract infection)   . Hyperlipemia   . Hypertension   . Internal hemorrhoids   . Peripheral vascular disease ([place])   . Shortness of breath     Patient Active Problem List   Diagnosis Date Noted  . Solitary pulmonary nodule 03/23/2016  . Pulmonary nodule 12/25/2015  . Aspiration of foreign body   . Lung nodule < 6cm on CT   . Foreign body aspiration 12/23/2015  . Encounter for therapeutic drug monitoring 03/28/2015  . Chronic systolic CHF (congestive heart failure) ([place]) 06/12/2014  . Chronic diastolic CHF (congestive heart  failure) ([place]) 04/23/2014  . Atrial fibrillation ([place]) 04/20/2014  . AAA (abdominal aortic aneurysm) without rupture ([place]) 04/13/2014  . Occlusion and stenosis of carotid artery without mention of cerebral infarction 04/09/2014  . CAD (coronary artery disease) of artery bypass graft 09/14/2012  . Abdominal aortic aneurysm ([place]) 09/14/2012  . Peripheral vascular disease with claudication ([place]) 09/14/2012  . Carotid artery disease without cerebral infarction ([place]) 09/14/2012  . History of aortic valve replacement with bioprosthetic valve 09/14/2012  . Essential hypertension, malignant 09/14/2012  . Hyperlipidemia with target LDL less than 70 09/14/2012  . ABDOMINAL AORTIC ANEURYSM 07/06/2009  . CEREBROVASCULAR DISEASE, HX OF 07/06/2009    Past Surgical History:  Procedure Laterality Date  . ABDOMINAL AORTIC ENDOVASCULAR STENT GRAFT N/A 04/13/2014   Procedure: ABDOMINAL AORTIC ENDOVASCULAR STENT GRAFT;  Surgeon: Serafina Mitchell, MD;  Location: [facility];  Service: Vascular;  Laterality: N/A;  . ANGIOPLASTY     Unsure if he had stents put in   . APPENDECTOMY    . CARDIAC VALVE SURGERY    . CARDIOVERSION N/A 06/06/2014   Procedure: CARDIOVERSION;  Surgeon: Larey Dresser, MD;  Location: [facility] ENDOSCOPY;  Service: Cardiovascular;  Laterality: N/A;  . CARDIOVERSION N/A 08/21/2014   Procedure: CARDIOVERSION;  Surgeon: Larey Dresser, MD;  Location: [facility];  Service: Cardiovascular;  Laterality: N/A;  . CORONARY ARTERY BYPASS GRAFT    .  EYE SURGERY Bilateral    cataract surgery  . HERNIA REPAIR     2 operations  . LEFT HEART CATHETERIZATION WITH CORONARY/GRAFT ANGIOGRAM N/A 07/10/2014   Procedure: LEFT HEART CATHETERIZATION WITH Beatrix Fetters;  Surgeon: Larey Dresser, MD;  Location: [facility] CATH LAB;  Service: Cardiovascular;  Laterality: N/A;  . TONSILLECTOMY    . VIDEO BRONCHOSCOPY Bilateral 12/25/2015   Procedure: VIDEO BRONCHOSCOPY WITHOUT FLUORO;  Surgeon: Javier Glazier, MD;   Location: [facility];  Service: Cardiopulmonary;  Laterality: Bilateral;       Home Medications    Prior to Admission medications   Medication Sig Start Date End Date Taking? Authorizing Provider  allopurinol (ZYLOPRIM) 300 MG tablet Take 300 mg by mouth daily.  05/13/17  Yes [provider]  atorvastatin (LIPITOR) 10 MG tablet Take 1 tablet (10 mg total) by mouth daily. Patient taking differently: Take 10 mg by mouth every other day.  06/25/17  Yes Larey Dresser, MD  enoxaparin (LOVENOX) 100 MG/ML injection Inject 100 mg into the skin every 12 (twelve) hours.  08/03/17  Yes [provider]  ezetimibe (ZETIA) 10 MG tablet Take 10 mg by mouth daily.    Yes [provider]  finasteride (PROSCAR) 5 MG tablet Take 5 mg by mouth daily.   Yes [provider]  atorvastatin (LIPITOR) 10 MG tablet Take 1 tablet (10 mg total) by mouth daily. Patient not taking: Reported on 08/10/2017 05/14/15   Larey Dresser, MD  atorvastatin (LIPITOR) 20 MG tablet TAKE ONE TABLET EVERY DAY Patient taking differently: TAKE ONE TABLET BY MOUTH EVERY OTHER DAY 07/19/17   Larey Dresser, MD  doxazosin (CARDURA) 1 MG tablet Take 2 tablets BY MOUTH in THE MORNING and 1 tablet in THE EVENING  for blood pressure and urinary symptoms    [provider]  furosemide (LASIX) 20 MG tablet TAKE ONE TABLET ALTERNATING WITH 40MG  INTHE MORNING 02/08/17   Larey Dresser, MD  furosemide (LASIX) 40 MG tablet TAKE ONE TABLET ALTERNATING WITH 20MG  INTHE MORNING 06/23/17   Bensimhon, Shaune Pascal, MD  ramipril (ALTACE) 2.5 MG capsule Take one cap daily with Ramipril 5mg  cap daily (total dose 7.5mg  daily) Patient taking differently: Take 2.5 mg by mouth every evening. Take one cap (2.5 mg) in the evening 02/02/17   Larey Dresser, MD  ramipril (ALTACE) 5 MG capsule Take one capsule daily with Ramipril 2.5mg  tablet daily (total dose 7.5mg  daily) Patient taking differently: Take 5 mg by mouth  every morning. Take 1 capsule (5 mg) by mouth in the morning 02/02/17   Larey Dresser, MD  sildenafil (VIAGRA) 50 MG tablet Take 50 mg by mouth.    [provider]  sitaGLIPtin (JANUVIA) 100 MG tablet Take 100 mg by mouth daily.    [provider]  spironolactone (ALDACTONE) 25 MG tablet TAKE ONE TABLET EACH DAY 10/22/15   Larey Dresser, MD  timolol (TIMOPTIC) 0.5 % ophthalmic solution  05/13/17   [provider]  warfarin (COUMADIN) 3 MG tablet TAKE AS DIRECTED BY COUMADIN CLINIC Patient taking differently: Take 2 tablets (6 mg) by mouth on Sun, Tues, Thurs and Sat and Take 3 tablets (9 mg) by mouth on MWF 04/15/17   Larey Dresser, MD    Family History Family History  Problem Relation Age of Onset  . Heart disease Mother   . Heart disease Father        before age 96  . AAA (  abdominal aortic aneurysm) Father   . Colon cancer Neg Hx     Social History Social History  Substance Use Topics  . Smoking status: Former Smoker    Packs/day: 0.25    Years: 20.00    Types: Cigarettes    Quit date: 03/23/1992  . Smokeless tobacco: Never Used  . Alcohol use 6.0 oz/week    10 Glasses of wine per week     Comment: 1-2 drinks daily      Allergies   Patient has no known allergies.   Review of Systems Review of Systems  Constitutional: Negative for chills and fever.  Eyes: Negative for photophobia and visual disturbance.  Respiratory: Negative for cough, chest tightness and shortness of breath.   Cardiovascular: Negative for chest pain and palpitations.  Gastrointestinal: Negative for abdominal pain, diarrhea, nausea and vomiting.  Genitourinary: Positive for decreased urine volume, difficulty urinating and hematuria. Negative for flank pain.  Musculoskeletal: Negative for back pain.  Skin: Negative for pallor and rash.  Neurological: Negative for dizziness and light-headedness.  Hematological: Does not bruise/bleed easily.       Patient usually takes  blood thinners but these were discontinued for surgery     Physical Exam Updated Vital Signs BP (!) 137/53 (BP Location: Left Arm)   Pulse 74   Temp 98.1 F (36.7 C) (Oral)   Resp 19   Ht 5' 10.5" (1.791 m)   Wt 89.8 kg (198 lb)   SpO2 96% Comment: RA Simultaneous filing. User may not have seen previous data.  BMI 28.01 kg/m   Physical Exam  Constitutional: He appears well-developed and well-nourished. No distress.  HENT:  Head: Normocephalic and atraumatic.  Eyes: Right eye exhibits no discharge. Left eye exhibits no discharge.  Cardiovascular: Normal rate, regular rhythm and intact distal pulses.   3/6 systolic murmur  Pulmonary/Chest: Effort normal and breath sounds normal. No respiratory distress.  Abdominal: Soft. Bowel sounds are normal. He exhibits no distension. There is no tenderness. There is no guarding.  Abdomen nontender to palpation in suprapubic region  Genitourinary:  Genitourinary Comments: Foley catheter present in urinary meatus, small amount of blood present on gauze. Catheter bag with dark red urine  Neurological: He is alert. Coordination normal.  Skin: Skin is warm and dry. Capillary refill takes less than 2 seconds. He is not diaphoretic. No pallor.  Psychiatric: He has a normal mood and affect. His behavior is normal.  Nursing note and vitals reviewed.    ED Treatments / Results  Labs (all labs ordered are listed, but only abnormal results are displayed) Labs Reviewed - No data to display  EKG  EKG Interpretation None       Radiology No results found.  Procedures Procedures (including critical care time)  Medications Ordered in ED Medications - No data to display   Initial Impression / Assessment and Plan / ED Course  I have reviewed the triage vital signs and the nursing notes.  Pertinent labs & imaging results that were available during my care of the patient were reviewed by me and considered in my medical decision making (see  chart for details).  Patient postop day 1 after TURP, concerned that urine was not flowing and concerned by the amount of blood in the urine. Vitals are stable no tachycardia or hypotension, good color.   Bladder scan shows 115 mL, and has had good urine production since arriving in the ED, grossly bloody but to be expected one day post-op. Catheter flushed  with normal saline good flow observed. Reassurance provided to patient and granddaughter that amount of blood is normal and vitals are stable. Educated patient on troubleshooting for catheter. Patient to see his urologist potentially have catheter removed in the morning. Plan to discharge home, patient and granddaughter are in agreement  Patient discussed with Dr. Dayna Barker, who saw patient as well and agrees with plan.   Final Clinical Impressions(s) / ED Diagnoses   Final diagnoses:  Hematuria, unspecified type  Obstruction of Foley catheter, initial encounter ([place])  Benign prostatic hyperplasia, unspecified whether lower urinary tract symptoms present    New Prescriptions New Prescriptions   No medications on file     Janet Berlin 08/11/17 0048    Merrily Pew, MD 08/11/17 1750

## 2017-08-10 NOTE — Discharge Instructions (Signed)
Bleeding after surgery is to be expected. Follow up with your Urologist tomorrow morning as planned. Return to the ED if you develop increased pain or no urine flows into the bag over multiple hours, or you develop fever, or chills.

## 2017-08-12 ENCOUNTER — Emergency Department (HOSPITAL_COMMUNITY)
Admission: EM | Admit: 2017-08-12 | Discharge: 2017-08-13 | Disposition: A | Discharge status: Home / Self Care | Payer: Medicare Other | Attending: Emergency Medicine | Admitting: Emergency Medicine

## 2017-08-12 DIAGNOSIS — Z87891 Personal history of nicotine dependence: Secondary | ICD-10-CM | POA: Insufficient documentation

## 2017-08-12 DIAGNOSIS — N138 Other obstructive and reflux uropathy: Secondary | ICD-10-CM | POA: Diagnosis not present

## 2017-08-12 DIAGNOSIS — I2510 Atherosclerotic heart disease of native coronary artery without angina pectoris: Secondary | ICD-10-CM | POA: Insufficient documentation

## 2017-08-12 DIAGNOSIS — I110 Hypertensive heart disease with heart failure: Secondary | ICD-10-CM | POA: Insufficient documentation

## 2017-08-12 DIAGNOSIS — I5042 Chronic combined systolic (congestive) and diastolic (congestive) heart failure: Secondary | ICD-10-CM | POA: Insufficient documentation

## 2017-08-12 DIAGNOSIS — Z951 Presence of aortocoronary bypass graft: Secondary | ICD-10-CM | POA: Diagnosis not present

## 2017-08-12 DIAGNOSIS — Z79899 Other long term (current) drug therapy: Secondary | ICD-10-CM | POA: Insufficient documentation

## 2017-08-12 DIAGNOSIS — R338 Other retention of urine: Secondary | ICD-10-CM | POA: Insufficient documentation

## 2017-08-12 DIAGNOSIS — E119 Type 2 diabetes mellitus without complications: Secondary | ICD-10-CM | POA: Diagnosis not present

## 2017-08-12 DIAGNOSIS — R339 Retention of urine, unspecified: Secondary | ICD-10-CM | POA: Diagnosis not present

## 2017-08-12 DIAGNOSIS — R319 Hematuria, unspecified: Secondary | ICD-10-CM | POA: Diagnosis not present

## 2017-08-12 DIAGNOSIS — Z7901 Long term (current) use of anticoagulants: Secondary | ICD-10-CM | POA: Diagnosis not present

## 2017-08-12 DIAGNOSIS — R030 Elevated blood-pressure reading, without diagnosis of hypertension: Secondary | ICD-10-CM | POA: Diagnosis not present

## 2017-08-12 DIAGNOSIS — N401 Enlarged prostate with lower urinary tract symptoms: Secondary | ICD-10-CM | POA: Diagnosis not present

## 2017-08-12 DIAGNOSIS — Z7984 Long term (current) use of oral hypoglycemic drugs: Secondary | ICD-10-CM | POA: Diagnosis not present

## 2017-08-12 LAB — I-STAT CHEM 8, ED
BUN: 22 mg/dL — ABNORMAL HIGH (ref 6–20)
CALCIUM ION: 1.1 mmol/L — AB (ref 1.15–1.40)
Chloride: 103 mmol/L (ref 101–111)
Creatinine, Ser: 1.1 mg/dL (ref 0.61–1.24)
Glucose, Bld: 178 mg/dL — ABNORMAL HIGH (ref 65–99)
HCT: 36 % — ABNORMAL LOW (ref 39.0–52.0)
Hemoglobin: 12.2 g/dL — ABNORMAL LOW (ref 13.0–17.0)
Potassium: 4.2 mmol/L (ref 3.5–5.1)
SODIUM: 139 mmol/L (ref 135–145)
TCO2: 24 mmol/L (ref 22–32)

## 2017-08-12 LAB — CBC WITH DIFFERENTIAL/PLATELET
Basophils Absolute: 0 10*3/uL (ref 0.0–0.1)
Basophils Relative: 0 %
EOS ABS: 0.3 10*3/uL (ref 0.0–0.7)
EOS PCT: 3 %
HCT: 35.3 % — ABNORMAL LOW (ref 39.0–52.0)
Hemoglobin: 11.7 g/dL — ABNORMAL LOW (ref 13.0–17.0)
Lymphocytes Relative: 14 %
Lymphs Abs: 1.3 10*3/uL (ref 0.7–4.0)
MCH: 29.6 pg (ref 26.0–34.0)
MCHC: 33.1 g/dL (ref 30.0–36.0)
MCV: 89.4 fL (ref 78.0–100.0)
Monocytes Absolute: 0.6 10*3/uL (ref 0.1–1.0)
Monocytes Relative: 7 %
Neutro Abs: 7.3 10*3/uL (ref 1.7–7.7)
Neutrophils Relative %: 76 %
Platelets: 214 10*3/uL (ref 150–400)
RBC: 3.95 MIL/uL — ABNORMAL LOW (ref 4.22–5.81)
RDW: 13.7 % (ref 11.5–15.5)
WBC: 9.6 10*3/uL (ref 4.0–10.5)

## 2017-08-12 LAB — PROTIME-INR
INR: 1.01
PROTHROMBIN TIME: 13.2 s (ref 11.4–15.2)

## 2017-08-12 LAB — POC OCCULT BLOOD, ED: FECAL OCCULT BLD: NEGATIVE

## 2017-08-12 MED ORDER — FENTANYL CITRATE (PF) 100 MCG/2ML IJ SOLN
50.0000 ug | Freq: Once | INTRAMUSCULAR | Status: AC
  Administered 2017-08-12: 50 ug via INTRAVENOUS
  Filled 2017-08-12: qty 2

## 2017-08-12 NOTE — ED Provider Notes (Signed)
Patient seen and evaluated. Initial Foley failed to drain. Three-way is placed without difficulty. Irrigated until clear effluent. We will discuss disposition with [facility] urologist.  He appears much more comfortable. Hemodynamics normal. Reassuring labs.   Tanna Furry, MD 08/12/17 [phone]

## 2017-08-12 NOTE — ED Notes (Signed)
Bed: DH74 Expected date:  Expected time:  Means of arrival:  Comments: EMS urinary retention

## 2017-08-12 NOTE — ED Provider Notes (Signed)
[facility] DEPT Provider Note   CSN: [id number] Arrival date & time: 08/12/17  2156     History   Chief Complaint No chief complaint on file.   HPI Nathaniel [name] is a 83 y.o. male.  HPI   83 year old male with history of atrial fibrillation currently on Coumadin, recently had a TURP procedure 4 days ago. Had a Foley placed, removed early this morning here for complaint of urinary retention. Patient states he was able to urinate a small amount after removal of his Foley however for the past 10 hours he is unable to urinate at all. He has the urge to urinate, also feel constipated,had a small bowel movement this morning. Currently complaining of a lot of pressure in his low pelvic and significant pain due to inability to urinate. No report of fever, chills. EMS was contacted and patient was brought here. When he tried to urinate only a small amount of blood were noted. His last normal bowel movement was 3 days ago. He usually has bowel movement every 2-3 days. He is not on pain and opiate pain medication at this time.  Past Medical History:  Diagnosis Date  . Atrial fibrillation ([place])   . Atrial fibrillation ([place])   . CAD (coronary artery disease)   . Cataract   . CHF (congestive heart failure) ([place])   . Colon polyps    adenomatous  . Diabetes mellitus   . Diverticulosis   . GERD (gastroesophageal reflux disease)   . Gout   . Hernia, abdominal   . Hx: UTI (urinary tract infection)   . Hyperlipemia   . Hypertension   . Internal hemorrhoids   . Peripheral vascular disease ([place])   . Shortness of breath     Patient Active Problem List   Diagnosis Date Noted  . Solitary pulmonary nodule 03/23/2016  . Pulmonary nodule 12/25/2015  . Aspiration of foreign body   . Lung nodule < 6cm on CT   . Foreign body aspiration 12/23/2015  . Encounter for therapeutic drug monitoring 03/28/2015  . Chronic systolic CHF (congestive heart failure) ([place]) 06/12/2014  . Chronic diastolic CHF  (congestive heart failure) ([place]) 04/23/2014  . Atrial fibrillation ([place]) 04/20/2014  . AAA (abdominal aortic aneurysm) without rupture ([place]) 04/13/2014  . Occlusion and stenosis of carotid artery without mention of cerebral infarction 04/09/2014  . CAD (coronary artery disease) of artery bypass graft 09/14/2012  . Abdominal aortic aneurysm ([place]) 09/14/2012  . Peripheral vascular disease with claudication ([place]) 09/14/2012  . Carotid artery disease without cerebral infarction ([place]) 09/14/2012  . History of aortic valve replacement with bioprosthetic valve 09/14/2012  . Essential hypertension, malignant 09/14/2012  . Hyperlipidemia with target LDL less than 70 09/14/2012  . ABDOMINAL AORTIC ANEURYSM 07/06/2009  . CEREBROVASCULAR DISEASE, HX OF 07/06/2009    Past Surgical History:  Procedure Laterality Date  . ABDOMINAL AORTIC ENDOVASCULAR STENT GRAFT N/A 04/13/2014   Procedure: ABDOMINAL AORTIC ENDOVASCULAR STENT GRAFT;  Surgeon: Serafina Mitchell, MD;  Location: [facility];  Service: Vascular;  Laterality: N/A;  . ANGIOPLASTY     Unsure if he had stents put in   . APPENDECTOMY    . CARDIAC VALVE SURGERY    . CARDIOVERSION N/A 06/06/2014   Procedure: CARDIOVERSION;  Surgeon: Larey Dresser, MD;  Location: [facility] ENDOSCOPY;  Service: Cardiovascular;  Laterality: N/A;  . CARDIOVERSION N/A 08/21/2014   Procedure: CARDIOVERSION;  Surgeon: Larey Dresser, MD;  Location: [facility];  Service: Cardiovascular;  Laterality: N/A;  .  CORONARY ARTERY BYPASS GRAFT    . EYE SURGERY Bilateral    cataract surgery  . HERNIA REPAIR     2 operations  . LEFT HEART CATHETERIZATION WITH CORONARY/GRAFT ANGIOGRAM N/A 07/10/2014   Procedure: LEFT HEART CATHETERIZATION WITH Beatrix Fetters;  Surgeon: Larey Dresser, MD;  Location: [facility] CATH LAB;  Service: Cardiovascular;  Laterality: N/A;  . TONSILLECTOMY    . VIDEO BRONCHOSCOPY Bilateral 12/25/2015   Procedure: VIDEO BRONCHOSCOPY WITHOUT FLUORO;  Surgeon: Javier Glazier, MD;  Location: [facility];  Service: Cardiopulmonary;  Laterality: Bilateral;       Home Medications    Prior to Admission medications   Medication Sig Start Date End Date Taking? Authorizing Provider  allopurinol (ZYLOPRIM) 300 MG tablet Take 300 mg by mouth daily.  05/13/17   [provider]  atorvastatin (LIPITOR) 10 MG tablet Take 1 tablet (10 mg total) by mouth daily. Patient not taking: Reported on 08/10/2017 05/14/15   Larey Dresser, MD  atorvastatin (LIPITOR) 10 MG tablet Take 1 tablet (10 mg total) by mouth daily. Patient taking differently: Take 10 mg by mouth every other day.  06/25/17   Larey Dresser, MD  atorvastatin (LIPITOR) 20 MG tablet TAKE ONE TABLET EVERY DAY Patient taking differently: TAKE ONE TABLET BY MOUTH EVERY OTHER DAY 07/19/17   Larey Dresser, MD  doxazosin (CARDURA) 1 MG tablet Take 2 tablets BY MOUTH in THE MORNING and 1 tablet in THE EVENING  for blood pressure and urinary symptoms    [provider]  enoxaparin (LOVENOX) 100 MG/ML injection Inject 100 mg into the skin every 12 (twelve) hours.  08/03/17   [provider]  ezetimibe (ZETIA) 10 MG tablet Take 10 mg by mouth daily.     [provider]  finasteride (PROSCAR) 5 MG tablet Take 5 mg by mouth daily.    [provider]  furosemide (LASIX) 20 MG tablet TAKE ONE TABLET ALTERNATING WITH 40MG  INTHE MORNING 02/08/17   Larey Dresser, MD  furosemide (LASIX) 40 MG tablet TAKE ONE TABLET ALTERNATING WITH 20MG  INTHE MORNING 06/23/17   Bensimhon, Shaune Pascal, MD  ramipril (ALTACE) 2.5 MG capsule Take one cap daily with Ramipril 5mg  cap daily (total dose 7.5mg  daily) Patient taking differently: Take 2.5 mg by mouth every evening. Take one cap (2.5 mg) in the evening 02/02/17   Larey Dresser, MD  ramipril (ALTACE) 5 MG capsule Take one capsule daily with Ramipril 2.5mg  tablet daily (total dose 7.5mg  daily) Patient taking differently: Take 5 mg by mouth  every morning. Take 1 capsule (5 mg) by mouth in the morning 02/02/17   Larey Dresser, MD  sildenafil (VIAGRA) 50 MG tablet Take 50 mg by mouth daily as needed for erectile dysfunction.     [provider]  sitaGLIPtin (JANUVIA) 100 MG tablet Take 100 mg by mouth daily.    [provider]  spironolactone (ALDACTONE) 25 MG tablet TAKE ONE TABLET EACH DAY 10/22/15   Larey Dresser, MD  timolol (TIMOPTIC) 0.5 % ophthalmic solution Place 1 drop into both eyes 2 (two) times daily.  05/13/17   [provider]  warfarin (COUMADIN) 3 MG tablet TAKE AS DIRECTED BY COUMADIN CLINIC Patient taking differently: Take 2 tablets (6 mg) by mouth on Sun, Tues, Thurs and Sat and Take 3 tablets (9 mg) by mouth on MWF 04/15/17   Larey Dresser, MD    Family History Family History  Problem Relation Age of  Onset  . Heart disease Mother   . Heart disease Father        before age 57  . AAA (abdominal aortic aneurysm) Father   . Colon cancer Neg Hx     Social History Social History  Substance Use Topics  . Smoking status: Former Smoker    Packs/day: 0.25    Years: 20.00    Types: Cigarettes    Quit date: 03/23/1992  . Smokeless tobacco: Never Used  . Alcohol use 6.0 oz/week    10 Glasses of wine per week     Comment: 1-2 drinks daily      Allergies   Patient has no known allergies.   Review of Systems Review of Systems  All other systems reviewed and are negative.    Physical Exam Updated Vital Signs There were no vitals taken for this visit.  Physical Exam  Constitutional: He appears well-developed and well-nourished.  Patient appears alert and uncomfortable, pacing around the room.  HENT:  Head: Atraumatic.  Eyes: Conjunctivae are normal.  Neck: Neck supple.  Cardiovascular: Normal rate and regular rhythm.   Pulmonary/Chest: Effort normal and breath sounds normal.  Abdominal:  Abdomen is mildly distended, tenderness to suprapubic region on palpation    Neurological: He is alert.  Skin: No rash noted.  Psychiatric: He has a normal mood and affect.  Nursing note and vitals reviewed.    ED Treatments / Results  Labs (all labs ordered are listed, but only abnormal results are displayed) Labs Reviewed  CBC WITH DIFFERENTIAL/PLATELET - Abnormal; Notable for the following:       Result Value   RBC 3.95 (*)    Hemoglobin 11.7 (*)    HCT 35.3 (*)    All other components within normal limits  I-STAT CHEM 8, ED - Abnormal; Notable for the following:    BUN 22 (*)    Glucose, Bld 178 (*)    Calcium, Ion 1.10 (*)    Hemoglobin 12.2 (*)    HCT 36.0 (*)    All other components within normal limits  PROTIME-INR  URINALYSIS, ROUTINE W REFLEX MICROSCOPIC  POC OCCULT BLOOD, ED    EKG  EKG Interpretation None       Radiology No results found.  Procedures BLADDER CATHETERIZATION Date/Time: 08/13/2017 12:16 AM Performed by: Domenic Moras Authorized by: Domenic Moras   Consent:    Consent obtained:  Verbal   Consent given by:  Patient   Risks discussed:  Urethral injury, incomplete procedure and false passage   Alternatives discussed:  Alternative treatment Pre-procedure details:    Procedure purpose:  Therapeutic   Preparation: Patient was prepped and draped in usual sterile fashion   Anesthesia (see MAR for exact dosages):    Anesthesia method:  None Procedure details:    Provider performed due to:  Nurse unable to complete   Catheter insertion:  Indwelling   Catheter type:  Triple lumen   Catheter size:  20 Fr   Bladder irrigation: yes     Number of attempts:  1   Urine characteristics:  Bloody Post-procedure details:    Patient tolerance of procedure:  Tolerated well, no immediate complications   (including critical care time)  Medications Ordered in ED Medications  fentaNYL (SUBLIMAZE) injection 50 mcg (50 mcg Intravenous Given 08/12/17 2256)     Initial Impression / Assessment and Plan / ED Course  I have  reviewed the triage vital signs and the nursing notes.  Pertinent labs & imaging  results that were available during my care of the patient were reviewed by me and considered in my medical decision making (see chart for details).     BP (!) 165/92   Pulse 75   Temp 98.1 F (36.7 C) (Oral)   Resp (!) 22   SpO2 96%    Final Clinical Impressions(s) / ED Diagnoses   Final diagnoses:  Acute urinary retention    New Prescriptions New Prescriptions   No medications on file   10:11 PM Pt with recent TURP procedure, had foley removed this morning but now he is having acute urinary retention.  Will place foley for comfort.    10:44 PM Initial bladder scan revealed greater than 600 mL of retained urine, a Foley was placed, and 75 mL of Frank blood were obtained after some irrigation. Patient still endorsed a moderate amount of discomfort. I performed a digital rectal exam, no obvious stool impaction noted. Plan to irrigate bladder.  12:07 AM Pt currently receiving bladder irrigation with good return.  Urine is now clear.  Pt felt much better.  Pt has normal INR, renal function within normal limit.  Appreciate consultation from [facility] Urology office Dr. Rolena Infante who recommend pt to f/u in office in the next few days. They will contact him to schedule an appointment.  Pt will be discharge with foley bag.   Care discussed with Dr. Jeneen Rinks.    Domenic Moras, PA-C 08/13/17 [place]    Tanna Furry, MD 08/24/17 [phone]

## 2017-08-12 NOTE — ED Triage Notes (Signed)
Patient arrives by EMS with complaints of urinary retention and constipation since 0930-states worse this evening. EMS states he tried to urinate on way here and voided blood-patient drinking champagne because he has 10/10 pain.

## 2017-08-13 LAB — URINALYSIS, ROUTINE W REFLEX MICROSCOPIC
BILIRUBIN URINE: NEGATIVE
Glucose, UA: 50 mg/dL — AB
KETONES UR: 5 mg/dL — AB
LEUKOCYTES UA: NEGATIVE
NITRITE: NEGATIVE
PH: 6 (ref 5.0–8.0)
Protein, ur: 100 mg/dL — AB
SPECIFIC GRAVITY, URINE: 1.015 (ref 1.005–1.030)
SQUAMOUS EPITHELIAL / LPF: NONE SEEN

## 2017-08-13 NOTE — ED Notes (Signed)
Pt. Requested to see EDP /PA again before discharge. PA at bedside.

## 2017-08-13 NOTE — Discharge Instructions (Signed)
Please follow up closely with your urologist for further care.

## 2017-08-20 ENCOUNTER — Other Ambulatory Visit: Payer: Self-pay | Admitting: Cardiology

## 2017-08-24 DIAGNOSIS — R39198 Other difficulties with micturition: Secondary | ICD-10-CM | POA: Diagnosis not present

## 2017-08-24 DIAGNOSIS — N3289 Other specified disorders of bladder: Secondary | ICD-10-CM | POA: Diagnosis not present

## 2017-08-27 DIAGNOSIS — E1165 Type 2 diabetes mellitus with hyperglycemia: Secondary | ICD-10-CM | POA: Diagnosis not present

## 2017-08-27 DIAGNOSIS — E782 Mixed hyperlipidemia: Secondary | ICD-10-CM | POA: Diagnosis not present

## 2017-08-31 DIAGNOSIS — E782 Mixed hyperlipidemia: Secondary | ICD-10-CM | POA: Diagnosis not present

## 2017-08-31 DIAGNOSIS — I10 Essential (primary) hypertension: Secondary | ICD-10-CM | POA: Diagnosis not present

## 2017-08-31 DIAGNOSIS — N139 Obstructive and reflux uropathy, unspecified: Secondary | ICD-10-CM | POA: Diagnosis not present

## 2017-08-31 DIAGNOSIS — I2510 Atherosclerotic heart disease of native coronary artery without angina pectoris: Secondary | ICD-10-CM | POA: Diagnosis not present

## 2017-08-31 DIAGNOSIS — M109 Gout, unspecified: Secondary | ICD-10-CM | POA: Diagnosis not present

## 2017-08-31 DIAGNOSIS — E1165 Type 2 diabetes mellitus with hyperglycemia: Secondary | ICD-10-CM | POA: Diagnosis not present

## 2017-08-31 DIAGNOSIS — E1151 Type 2 diabetes mellitus with diabetic peripheral angiopathy without gangrene: Secondary | ICD-10-CM | POA: Diagnosis not present

## 2017-08-31 DIAGNOSIS — E1159 Type 2 diabetes mellitus with other circulatory complications: Secondary | ICD-10-CM | POA: Diagnosis not present

## 2017-09-08 DIAGNOSIS — R310 Gross hematuria: Secondary | ICD-10-CM | POA: Diagnosis not present

## 2017-10-04 ENCOUNTER — Other Ambulatory Visit: Payer: Self-pay | Admitting: *Deleted

## 2017-10-04 DIAGNOSIS — I6523 Occlusion and stenosis of bilateral carotid arteries: Secondary | ICD-10-CM

## 2017-11-08 ENCOUNTER — Other Ambulatory Visit: Payer: Self-pay | Admitting: Cardiology

## 2017-11-08 DIAGNOSIS — E1165 Type 2 diabetes mellitus with hyperglycemia: Secondary | ICD-10-CM | POA: Diagnosis not present

## 2017-11-08 DIAGNOSIS — E782 Mixed hyperlipidemia: Secondary | ICD-10-CM | POA: Diagnosis not present

## 2017-11-09 ENCOUNTER — Telehealth (HOSPITAL_COMMUNITY): Payer: Self-pay | Admitting: Pharmacist

## 2017-11-09 MED ORDER — WARFARIN SODIUM 3 MG PO TABS: ORAL_TABLET | ORAL | 0 refills | Status: DC

## 2017-11-09 NOTE — Telephone Encounter (Signed)
Received a call from Nathaniel [name] that he is out of Coumadin refills. It looks like he has not had a Coumadin Clinic visit since 07/2017. He stated that he had prostate surgery and then had some complications that left him hospitalized for a few weeks. He states that he restarted his Coumadin about a month ago now. I have informed him that he needs to make a Coumadin Clinic visit ASAP so that we can check his INR and verify dosing. Will send 1 week of his previous dosing of Coumadin in the meantime.   [name]. Velva Harman, PharmD, BCPS, CPP Clinical Pharmacist Pager: [phone] Phone: [phone] 11/09/2017 4:07 PM

## 2017-11-11 ENCOUNTER — Ambulatory Visit (INDEPENDENT_AMBULATORY_CARE_PROVIDER_SITE_OTHER): Payer: Medicare Other | Admitting: *Deleted

## 2017-11-11 DIAGNOSIS — E782 Mixed hyperlipidemia: Secondary | ICD-10-CM | POA: Diagnosis not present

## 2017-11-11 DIAGNOSIS — N189 Chronic kidney disease, unspecified: Secondary | ICD-10-CM | POA: Diagnosis not present

## 2017-11-11 DIAGNOSIS — N139 Obstructive and reflux uropathy, unspecified: Secondary | ICD-10-CM | POA: Diagnosis not present

## 2017-11-11 DIAGNOSIS — M109 Gout, unspecified: Secondary | ICD-10-CM | POA: Diagnosis not present

## 2017-11-11 DIAGNOSIS — I2510 Atherosclerotic heart disease of native coronary artery without angina pectoris: Secondary | ICD-10-CM | POA: Diagnosis not present

## 2017-11-11 DIAGNOSIS — E1151 Type 2 diabetes mellitus with diabetic peripheral angiopathy without gangrene: Secondary | ICD-10-CM | POA: Diagnosis not present

## 2017-11-11 DIAGNOSIS — E1165 Type 2 diabetes mellitus with hyperglycemia: Secondary | ICD-10-CM | POA: Diagnosis not present

## 2017-11-11 DIAGNOSIS — E1159 Type 2 diabetes mellitus with other circulatory complications: Secondary | ICD-10-CM | POA: Diagnosis not present

## 2017-11-11 DIAGNOSIS — I129 Hypertensive chronic kidney disease with stage 1 through stage 4 chronic kidney disease, or unspecified chronic kidney disease: Secondary | ICD-10-CM | POA: Diagnosis not present

## 2017-11-11 DIAGNOSIS — Z5181 Encounter for therapeutic drug level monitoring: Secondary | ICD-10-CM

## 2017-11-11 DIAGNOSIS — I4891 Unspecified atrial fibrillation: Secondary | ICD-10-CM

## 2017-11-11 LAB — POCT INR: INR: 2.3

## 2017-11-11 MED ORDER — WARFARIN SODIUM 3 MG PO TABS: ORAL_TABLET | ORAL | 1 refills | Status: DC

## 2017-11-11 NOTE — Patient Instructions (Signed)
Continue taking 2 tablets everyday except 3 tablets on Mondays, Wednesdays and Fridays. Recheck in 4 weeks.   Call us with any new medications or concerns, Coumadin Clinic # [phone], Main # [phone].

## 2017-11-18 ENCOUNTER — Telehealth: Payer: Self-pay | Admitting: Pulmonary Disease

## 2017-11-18 DIAGNOSIS — R918 Other nonspecific abnormal finding of lung field: Secondary | ICD-10-CM

## 2017-11-18 NOTE — Telephone Encounter (Signed)
Pt has upcoming CT Chest in 12/2017 ordered by Dr. Ashok Cordia. Dr. Ashok Cordia will no longer be with the practice at the time of the scan so the CT chest will need to be ordered under a different provider. Per office protocol, will order under TP for CT chest.   Will forward to TP as FYI.

## 2017-12-09 ENCOUNTER — Ambulatory Visit (INDEPENDENT_AMBULATORY_CARE_PROVIDER_SITE_OTHER): Payer: Medicare Other | Admitting: *Deleted

## 2017-12-09 DIAGNOSIS — I4891 Unspecified atrial fibrillation: Secondary | ICD-10-CM

## 2017-12-09 DIAGNOSIS — Z5181 Encounter for therapeutic drug level monitoring: Secondary | ICD-10-CM | POA: Diagnosis not present

## 2017-12-09 LAB — POCT INR: INR: 2.5

## 2017-12-09 NOTE — Patient Instructions (Signed)
Description   Continue taking 2 tablets everyday except 3 tablets on Mondays, Wednesdays and Fridays. Recheck in 4 weeks.   Call us with any new medications or concerns, [facility] # [phone], Main # [phone].

## 2017-12-19 ENCOUNTER — Encounter (HOSPITAL_COMMUNITY): Payer: Self-pay | Admitting: *Deleted

## 2017-12-19 ENCOUNTER — Inpatient Hospital Stay (HOSPITAL_COMMUNITY): Payer: Medicare Other

## 2017-12-19 ENCOUNTER — Other Ambulatory Visit: Payer: Self-pay

## 2017-12-19 ENCOUNTER — Emergency Department (HOSPITAL_COMMUNITY): Payer: Medicare Other

## 2017-12-19 ENCOUNTER — Inpatient Hospital Stay (HOSPITAL_COMMUNITY)
Admission: EM | Admit: 2017-12-19 | Discharge: 2017-12-22 | DRG: 308 | Disposition: A | Discharge status: Home Health | Payer: Medicare Other | Attending: Family Medicine | Admitting: Family Medicine

## 2017-12-19 DIAGNOSIS — R0602 Shortness of breath: Secondary | ICD-10-CM | POA: Diagnosis not present

## 2017-12-19 DIAGNOSIS — I6523 Occlusion and stenosis of bilateral carotid arteries: Secondary | ICD-10-CM | POA: Diagnosis not present

## 2017-12-19 DIAGNOSIS — K219 Gastro-esophageal reflux disease without esophagitis: Secondary | ICD-10-CM | POA: Diagnosis not present

## 2017-12-19 DIAGNOSIS — Z23 Encounter for immunization: Secondary | ICD-10-CM | POA: Diagnosis not present

## 2017-12-19 DIAGNOSIS — I6521 Occlusion and stenosis of right carotid artery: Secondary | ICD-10-CM | POA: Diagnosis present

## 2017-12-19 DIAGNOSIS — R0981 Nasal congestion: Secondary | ICD-10-CM | POA: Diagnosis not present

## 2017-12-19 DIAGNOSIS — R059 Cough, unspecified: Secondary | ICD-10-CM | POA: Diagnosis present

## 2017-12-19 DIAGNOSIS — I714 Abdominal aortic aneurysm, without rupture, unspecified: Secondary | ICD-10-CM | POA: Diagnosis present

## 2017-12-19 DIAGNOSIS — I5032 Chronic diastolic (congestive) heart failure: Secondary | ICD-10-CM | POA: Diagnosis not present

## 2017-12-19 DIAGNOSIS — R791 Abnormal coagulation profile: Secondary | ICD-10-CM | POA: Diagnosis present

## 2017-12-19 DIAGNOSIS — I2510 Atherosclerotic heart disease of native coronary artery without angina pectoris: Secondary | ICD-10-CM | POA: Diagnosis present

## 2017-12-19 DIAGNOSIS — Z951 Presence of aortocoronary bypass graft: Secondary | ICD-10-CM

## 2017-12-19 DIAGNOSIS — I10 Essential (primary) hypertension: Secondary | ICD-10-CM | POA: Diagnosis present

## 2017-12-19 DIAGNOSIS — J209 Acute bronchitis, unspecified: Secondary | ICD-10-CM | POA: Diagnosis not present

## 2017-12-19 DIAGNOSIS — J40 Bronchitis, not specified as acute or chronic: Secondary | ICD-10-CM | POA: Diagnosis not present

## 2017-12-19 DIAGNOSIS — E785 Hyperlipidemia, unspecified: Secondary | ICD-10-CM | POA: Diagnosis present

## 2017-12-19 DIAGNOSIS — J9601 Acute respiratory failure with hypoxia: Secondary | ICD-10-CM | POA: Diagnosis not present

## 2017-12-19 DIAGNOSIS — E1151 Type 2 diabetes mellitus with diabetic peripheral angiopathy without gangrene: Secondary | ICD-10-CM | POA: Diagnosis not present

## 2017-12-19 DIAGNOSIS — Z7901 Long term (current) use of anticoagulants: Secondary | ICD-10-CM

## 2017-12-19 DIAGNOSIS — B9781 Human metapneumovirus as the cause of diseases classified elsewhere: Secondary | ICD-10-CM | POA: Diagnosis present

## 2017-12-19 DIAGNOSIS — I5043 Acute on chronic combined systolic (congestive) and diastolic (congestive) heart failure: Secondary | ICD-10-CM | POA: Diagnosis not present

## 2017-12-19 DIAGNOSIS — S0990XA Unspecified injury of head, initial encounter: Secondary | ICD-10-CM | POA: Diagnosis not present

## 2017-12-19 DIAGNOSIS — Z7984 Long term (current) use of oral hypoglycemic drugs: Secondary | ICD-10-CM

## 2017-12-19 DIAGNOSIS — I340 Nonrheumatic mitral (valve) insufficiency: Secondary | ICD-10-CM | POA: Diagnosis not present

## 2017-12-19 DIAGNOSIS — I5022 Chronic systolic (congestive) heart failure: Secondary | ICD-10-CM | POA: Diagnosis not present

## 2017-12-19 DIAGNOSIS — N400 Enlarged prostate without lower urinary tract symptoms: Secondary | ICD-10-CM | POA: Diagnosis present

## 2017-12-19 DIAGNOSIS — I5042 Chronic combined systolic (congestive) and diastolic (congestive) heart failure: Secondary | ICD-10-CM | POA: Diagnosis present

## 2017-12-19 DIAGNOSIS — Z8249 Family history of ischemic heart disease and other diseases of the circulatory system: Secondary | ICD-10-CM

## 2017-12-19 DIAGNOSIS — Z87891 Personal history of nicotine dependence: Secondary | ICD-10-CM | POA: Diagnosis not present

## 2017-12-19 DIAGNOSIS — Z8679 Personal history of other diseases of the circulatory system: Secondary | ICD-10-CM

## 2017-12-19 DIAGNOSIS — I4891 Unspecified atrial fibrillation: Secondary | ICD-10-CM | POA: Diagnosis not present

## 2017-12-19 DIAGNOSIS — R05 Cough: Secondary | ICD-10-CM | POA: Diagnosis present

## 2017-12-19 DIAGNOSIS — I110 Hypertensive heart disease with heart failure: Secondary | ICD-10-CM | POA: Diagnosis not present

## 2017-12-19 DIAGNOSIS — I25810 Atherosclerosis of coronary artery bypass graft(s) without angina pectoris: Secondary | ICD-10-CM | POA: Diagnosis not present

## 2017-12-19 DIAGNOSIS — J211 Acute bronchiolitis due to human metapneumovirus: Secondary | ICD-10-CM | POA: Diagnosis not present

## 2017-12-19 DIAGNOSIS — Z953 Presence of xenogenic heart valve: Secondary | ICD-10-CM

## 2017-12-19 DIAGNOSIS — Z79899 Other long term (current) drug therapy: Secondary | ICD-10-CM

## 2017-12-19 LAB — I-STAT CG4 LACTIC ACID, ED: LACTIC ACID, VENOUS: 1.77 mmol/L (ref 0.5–1.9)

## 2017-12-19 LAB — CBC
HEMATOCRIT: 39 % (ref 39.0–52.0)
Hemoglobin: 12.3 g/dL — ABNORMAL LOW (ref 13.0–17.0)
MCH: 26.9 pg (ref 26.0–34.0)
MCHC: 31.5 g/dL (ref 30.0–36.0)
MCV: 85.3 fL (ref 78.0–100.0)
Platelets: 229 10*3/uL (ref 150–400)
RBC: 4.57 MIL/uL (ref 4.22–5.81)
RDW: 15.3 % (ref 11.5–15.5)
WBC: 7.3 10*3/uL (ref 4.0–10.5)

## 2017-12-19 LAB — BASIC METABOLIC PANEL
ANION GAP: 9 (ref 5–15)
BUN: 16 mg/dL (ref 6–20)
CALCIUM: 8.6 mg/dL — AB (ref 8.9–10.3)
CO2: 24 mmol/L (ref 22–32)
Chloride: 100 mmol/L — ABNORMAL LOW (ref 101–111)
Creatinine, Ser: 0.92 mg/dL (ref 0.61–1.24)
GLUCOSE: 140 mg/dL — AB (ref 65–99)
POTASSIUM: 4 mmol/L (ref 3.5–5.1)
Sodium: 133 mmol/L — ABNORMAL LOW (ref 135–145)

## 2017-12-19 LAB — TROPONIN I: Troponin I: <0.03 ng/mL (ref <0.03)

## 2017-12-19 LAB — PROTIME-INR
INR: 5.16
Prothrombin Time: 47.2 seconds — ABNORMAL HIGH (ref 11.4–15.2)

## 2017-12-19 LAB — TSH: TSH: 0.633 u[IU]/mL (ref 0.350–4.500)

## 2017-12-19 LAB — GLUCOSE, CAPILLARY: GLUCOSE-CAPILLARY: 308 mg/dL — AB (ref 65–99)

## 2017-12-19 LAB — BRAIN NATRIURETIC PEPTIDE: B NATRIURETIC PEPTIDE 5: 264.6 pg/mL — AB (ref 0.0–100.0)

## 2017-12-19 MED ORDER — DILTIAZEM LOAD VIA INFUSION
10.0000 mg | Freq: Once | INTRAVENOUS | Status: AC
  Administered 2017-12-19: 10 mg via INTRAVENOUS
  Filled 2017-12-19: qty 10

## 2017-12-19 MED ORDER — INSULIN ASPART 100 UNIT/ML ~~LOC~~ SOLN
0.0000 [IU] | Freq: Every day | SUBCUTANEOUS | Status: DC
  Administered 2017-12-19: 4 [IU] via SUBCUTANEOUS
  Administered 2017-12-20 – 2017-12-21 (×2): 3 [IU] via SUBCUTANEOUS

## 2017-12-19 MED ORDER — ONDANSETRON HCL 4 MG/2ML IJ SOLN: 4.0000 mg | Freq: Four times a day (QID) | INTRAMUSCULAR | Status: DC | PRN

## 2017-12-19 MED ORDER — PNEUMOCOCCAL VAC POLYVALENT 25 MCG/0.5ML IJ INJ: 0.5000 mL | INJECTION | INTRAMUSCULAR | Status: DC

## 2017-12-19 MED ORDER — METHYLPREDNISOLONE SODIUM SUCC 40 MG IJ SOLR
40.0000 mg | Freq: Two times a day (BID) | INTRAMUSCULAR | Status: DC
  Administered 2017-12-19 – 2017-12-21 (×5): 40 mg via INTRAVENOUS
  Filled 2017-12-19 (×5): qty 1

## 2017-12-19 MED ORDER — PREDNISONE 20 MG PO TABS
60.0000 mg | ORAL_TABLET | Freq: Once | ORAL | Status: AC
  Administered 2017-12-19: 60 mg via ORAL
  Filled 2017-12-19: qty 3

## 2017-12-19 MED ORDER — ATORVASTATIN CALCIUM 10 MG PO TABS
10.0000 mg | ORAL_TABLET | ORAL | Status: DC
  Administered 2017-12-20: 10 mg via ORAL
  Filled 2017-12-19 (×2): qty 1

## 2017-12-19 MED ORDER — TIMOLOL MALEATE 0.5 % OP SOLN
1.0000 [drp] | Freq: Two times a day (BID) | OPHTHALMIC | Status: DC
  Administered 2017-12-19 – 2017-12-21 (×5): 1 [drp] via OPHTHALMIC
  Filled 2017-12-19: qty 5

## 2017-12-19 MED ORDER — ALBUTEROL SULFATE (2.5 MG/3ML) 0.083% IN NEBU
5.0000 mg | INHALATION_SOLUTION | Freq: Once | RESPIRATORY_TRACT | Status: AC
  Administered 2017-12-19: 5 mg via RESPIRATORY_TRACT
  Filled 2017-12-19: qty 6

## 2017-12-19 MED ORDER — DILTIAZEM HCL-DEXTROSE 100-5 MG/100ML-% IV SOLN (PREMIX)
5.0000 mg/h | INTRAVENOUS | Status: DC
  Administered 2017-12-19: 5 mg/h via INTRAVENOUS
  Administered 2017-12-19 – 2017-12-21 (×5): 10 mg/h via INTRAVENOUS
  Administered 2017-12-22: 5 mg/h via INTRAVENOUS
  Filled 2017-12-19 (×7): qty 100

## 2017-12-19 MED ORDER — IPRATROPIUM BROMIDE 0.02 % IN SOLN
0.5000 mg | Freq: Once | RESPIRATORY_TRACT | Status: AC
  Administered 2017-12-19: 0.5 mg via RESPIRATORY_TRACT
  Filled 2017-12-19: qty 2.5

## 2017-12-19 MED ORDER — IPRATROPIUM-ALBUTEROL 0.5-2.5 (3) MG/3ML IN SOLN
3.0000 mL | Freq: Four times a day (QID) | RESPIRATORY_TRACT | Status: AC
  Administered 2017-12-19 – 2017-12-20 (×3): 3 mL via RESPIRATORY_TRACT
  Filled 2017-12-19 (×3): qty 3

## 2017-12-19 MED ORDER — ALLOPURINOL 300 MG PO TABS
300.0000 mg | ORAL_TABLET | Freq: Every day | ORAL | Status: DC
  Administered 2017-12-19 – 2017-12-22 (×4): 300 mg via ORAL
  Filled 2017-12-19 (×4): qty 1

## 2017-12-19 MED ORDER — INFLUENZA VAC SPLIT HIGH-DOSE 0.5 ML IM SUSY
0.5000 mL | PREFILLED_SYRINGE | INTRAMUSCULAR | Status: DC
  Filled 2017-12-19: qty 0.5

## 2017-12-19 MED ORDER — FINASTERIDE 5 MG PO TABS
5.0000 mg | ORAL_TABLET | Freq: Every day | ORAL | Status: DC
  Administered 2017-12-19 – 2017-12-22 (×4): 5 mg via ORAL
  Filled 2017-12-19 (×4): qty 1

## 2017-12-19 MED ORDER — TAMSULOSIN HCL 0.4 MG PO CAPS
0.4000 mg | ORAL_CAPSULE | Freq: Every day | ORAL | Status: DC
  Administered 2017-12-20 – 2017-12-22 (×3): 0.4 mg via ORAL
  Filled 2017-12-19 (×3): qty 1

## 2017-12-19 MED ORDER — ACETAMINOPHEN 325 MG PO TABS: 650.0000 mg | ORAL_TABLET | ORAL | Status: DC | PRN

## 2017-12-19 MED ORDER — INSULIN ASPART 100 UNIT/ML ~~LOC~~ SOLN
0.0000 [IU] | Freq: Three times a day (TID) | SUBCUTANEOUS | Status: DC
  Administered 2017-12-20: 5 [IU] via SUBCUTANEOUS
  Administered 2017-12-20 (×2): 3 [IU] via SUBCUTANEOUS
  Administered 2017-12-21: 15 [IU] via SUBCUTANEOUS
  Administered 2017-12-21: 5 [IU] via SUBCUTANEOUS
  Administered 2017-12-21: 8 [IU] via SUBCUTANEOUS
  Administered 2017-12-22: 5 [IU] via SUBCUTANEOUS
  Administered 2017-12-22: 3 [IU] via SUBCUTANEOUS

## 2017-12-19 MED ORDER — EZETIMIBE 10 MG PO TABS
10.0000 mg | ORAL_TABLET | Freq: Every day | ORAL | Status: DC
  Administered 2017-12-19 – 2017-12-22 (×4): 10 mg via ORAL
  Filled 2017-12-19 (×4): qty 1

## 2017-12-19 NOTE — ED Triage Notes (Signed)
Pt sent to ED from [facility] for sob and productive cough.  Denies fevers.  No LE edema, though states that for the last 5 days he has been unable to lay flat.

## 2017-12-19 NOTE — ED Provider Notes (Signed)
[facility] EMERGENCY DEPARTMENT Provider Note   CSN: [id number] Arrival date & time: 12/19/17  1134     History   Chief Complaint Chief Complaint  Patient presents with  . Shortness of Breath    HPI Nathaniel [name] is a 84 y.o. male.  Patient is an 84 year old male with a history of atrial fibrillation on Coumadin, coronary artery disease, diabetes, hypertension, peripheral vascular disease presenting today with shortness of breath.  Patient states about a week ago he started to have cough, congestion and mild sputum production.  Symptoms have persisted and shortness of breath is gotten worse.  He states before last week he can walk 17-20 minutes without becoming winded however today he is having difficulty walking across the room.  He denies any leg swelling or concern for weight gain.  He spoke with his doctor's office over the phone earlier this week and was given Tamiflu and a pill that he said he took for 5 days but he is not sure what it was.  However he states nothing helped and he is feeling worse.  Patient states he had cardioversion about 2 years ago and as far as he knows had not been in atrial fibrillation.  He has not been running fever and denies any abdominal pain or vomiting.   The history is provided by the patient.    Past Medical History:  Diagnosis Date  . Atrial fibrillation ([place])   . Atrial fibrillation ([place])   . CAD (coronary artery disease)   . Cataract   . CHF (congestive heart failure) ([place])   . Colon polyps    adenomatous  . Diabetes mellitus   . Diverticulosis   . GERD (gastroesophageal reflux disease)   . Gout   . Hernia, abdominal   . Hx: UTI (urinary tract infection)   . Hyperlipemia   . Hypertension   . Internal hemorrhoids   . Peripheral vascular disease ([place])   . Shortness of breath     Patient Active Problem List   Diagnosis Date Noted  . Solitary pulmonary nodule 03/23/2016  . Pulmonary nodule 12/25/2015  .  Aspiration of foreign body   . Lung nodule < 6cm on CT   . Foreign body aspiration 12/23/2015  . Encounter for therapeutic drug monitoring 03/28/2015  . Chronic systolic CHF (congestive heart failure) ([place]) 06/12/2014  . Chronic diastolic CHF (congestive heart failure) ([place]) 04/23/2014  . Atrial fibrillation ([place]) 04/20/2014  . AAA (abdominal aortic aneurysm) without rupture ([place]) 04/13/2014  . Occlusion and stenosis of carotid artery without mention of cerebral infarction 04/09/2014  . CAD (coronary artery disease) of artery bypass graft 09/14/2012  . Abdominal aortic aneurysm ([place]) 09/14/2012  . Peripheral vascular disease with claudication ([place]) 09/14/2012  . Carotid artery disease without cerebral infarction ([place]) 09/14/2012  . History of aortic valve replacement with bioprosthetic valve 09/14/2012  . Essential hypertension, malignant 09/14/2012  . Hyperlipidemia with target LDL less than 70 09/14/2012  . ABDOMINAL AORTIC ANEURYSM 07/06/2009  . CEREBROVASCULAR DISEASE, HX OF 07/06/2009    Past Surgical History:  Procedure Laterality Date  . ABDOMINAL AORTIC ENDOVASCULAR STENT GRAFT N/A 04/13/2014   Procedure: ABDOMINAL AORTIC ENDOVASCULAR STENT GRAFT;  Surgeon: Serafina Mitchell, MD;  Location: [facility];  Service: Vascular;  Laterality: N/A;  . ANGIOPLASTY     Unsure if he had stents put in   . APPENDECTOMY    . CARDIAC VALVE SURGERY    . CARDIOVERSION N/A 06/06/2014   Procedure:  CARDIOVERSION;  Surgeon: Larey Dresser, MD;  [facility] ENDOSCOPY;  Service: Cardiovascular;  Laterality: N/A;  . CARDIOVERSION N/A 08/21/2014   Procedure: CARDIOVERSION;  Surgeon: Larey Dresser, MD;  Location: [facility];  Service: Cardiovascular;  Laterality: N/A;  . CORONARY ARTERY BYPASS GRAFT    . EYE SURGERY Bilateral    cataract surgery  . HERNIA REPAIR     2 operations  . LEFT HEART CATHETERIZATION WITH CORONARY/GRAFT ANGIOGRAM N/A 07/10/2014   Procedure: LEFT HEART CATHETERIZATION WITH  Beatrix Fetters;  Surgeon: Larey Dresser, MD;  Location: [facility] CATH LAB;  Service: Cardiovascular;  Laterality: N/A;  . TONSILLECTOMY    . VIDEO BRONCHOSCOPY Bilateral 12/25/2015   Procedure: VIDEO BRONCHOSCOPY WITHOUT FLUORO;  Surgeon: Javier Glazier, MD;  Location: [facility];  Service: Cardiopulmonary;  Laterality: Bilateral;       Home Medications    Prior to Admission medications   Medication Sig Start Date End Date Taking? Authorizing Provider  allopurinol (ZYLOPRIM) 300 MG tablet Take 300 mg by mouth daily.  05/13/17   [provider]  atorvastatin (LIPITOR) 10 MG tablet Take 1 tablet (10 mg total) by mouth daily. Patient taking differently: Take 10 mg by mouth every other day.  06/25/17   Larey Dresser, MD  atorvastatin (LIPITOR) 20 MG tablet TAKE ONE TABLET EVERY DAY Patient taking differently: TAKE ONE TABLET BY MOUTH EVERY OTHER DAY 07/19/17   Larey Dresser, MD  doxazosin (CARDURA) 1 MG tablet Take 2 tablets BY MOUTH in THE MORNING and 1 tablet in THE EVENING  for blood pressure and urinary symptoms    [provider]  ezetimibe (ZETIA) 10 MG tablet Take 10 mg by mouth daily.     [provider]  finasteride (PROSCAR) 5 MG tablet Take 5 mg by mouth daily.    [provider]  furosemide (LASIX) 20 MG tablet TAKE ONE TABLET ALTERNATING WITH 40MG  INTHE MORNING 02/08/17   Larey Dresser, MD  furosemide (LASIX) 40 MG tablet TAKE ONE TABLET ALTERNATING WITH 20MG  INTHE MORNING 06/23/17   Bensimhon, Shaune Pascal, MD  ramipril (ALTACE) 2.5 MG capsule Take one cap daily with Ramipril 5mg  cap daily (total dose 7.5mg  daily) Patient taking differently: Take 2.5 mg by mouth every evening. Take one cap (2.5 mg) in the evening 02/02/17   Larey Dresser, MD  ramipril (ALTACE) 5 MG capsule Take one capsule daily with Ramipril 2.5mg  tablet daily (total dose 7.5mg  daily) Patient taking differently: Take 5 mg by mouth every morning. Take 1 capsule (5 mg)  by mouth in the morning 02/02/17   Larey Dresser, MD  sildenafil (VIAGRA) 50 MG tablet Take 50 mg by mouth daily as needed for erectile dysfunction.     [provider]  sitaGLIPtin (JANUVIA) 100 MG tablet Take 100 mg by mouth daily.    [provider]  spironolactone (ALDACTONE) 25 MG tablet TAKE ONE TABLET EACH DAY 08/20/17   Larey Dresser, MD  tamsulosin (FLOMAX) 0.4 MG CAPS capsule Take 0.4 mg by mouth daily. 08/24/17   [provider]  timolol (TIMOPTIC) 0.5 % ophthalmic solution Place 1 drop into both eyes 2 (two) times daily.  05/13/17   [provider]  warfarin (COUMADIN) 3 MG tablet Take 2 tablets (6 mg) by mouth on Sun, Tues, Thurs and Sat and Take 3 tablets (9 mg) by mouth on MWF 11/11/17   Larey Dresser, MD    Family History Family History  Problem  Relation Age of Onset  . Heart disease Mother   . Heart disease Father        before age 25  . AAA (abdominal aortic aneurysm) Father   . Colon cancer Neg Hx     Social History Social History   Tobacco Use  . Smoking status: Former Smoker    Packs/day: 0.25    Years: 20.00    Pack years: 5.00    Types: Cigarettes    Last attempt to quit: 03/23/1992    Years since quitting: 25.7  . Smokeless tobacco: Never Used  Substance Use Topics  . Alcohol use: Yes    Alcohol/week: 6.0 oz    Types: 10 Glasses of wine per week    Comment: 1-2 drinks daily   . Drug use: No     Allergies   Patient has no known allergies.   Review of Systems Review of Systems  All other systems reviewed and are negative.    Physical Exam Updated Vital Signs BP 133/84   Pulse 82   Temp 97.6 F (36.4 C) (Oral)   Resp (!) 23   Ht 5' 10.5" (1.791 m)   Wt 85.3 kg (188 lb)   SpO2 98%   BMI 26.59 kg/m   Physical Exam  Constitutional: He is oriented to person, place, and time. He appears well-developed and well-nourished. No distress.  HENT:  Head: Normocephalic and atraumatic.  Mouth/Throat:  Oropharynx is clear and moist.  Eyes: Conjunctivae and EOM are normal. Pupils are equal, round, and reactive to light.  Neck: Normal range of motion. Neck supple.  Cardiovascular: Intact distal pulses. An irregularly irregular rhythm present. Tachycardia present.  No murmur heard. Pulmonary/Chest: Effort normal. No accessory muscle usage. Tachypnea noted. No respiratory distress. He has wheezes. He has no rales.  Abdominal: Soft. He exhibits no distension. There is no tenderness. There is no rebound and no guarding.  Musculoskeletal: Normal range of motion. He exhibits no edema or tenderness.       Right lower leg: Normal. He exhibits no tenderness and no edema.       Left lower leg: Normal. He exhibits no tenderness and no edema.  Neurological: He is alert and oriented to person, place, and time.  Skin: Skin is warm and dry. No rash noted. No erythema.  Psychiatric: He has a normal mood and affect. His behavior is normal.  Nursing note and vitals reviewed.    ED Treatments / Results  Labs (all labs ordered are listed, but only abnormal results are displayed) Labs Reviewed  BASIC METABOLIC PANEL - Abnormal; Notable for the following components:      Result Value   Sodium 133 (*)    Chloride 100 (*)    Glucose, Bld 140 (*)    Calcium 8.6 (*)    All other components within normal limits  CBC - Abnormal; Notable for the following components:   Hemoglobin 12.3 (*)    All other components within normal limits  BRAIN NATRIURETIC PEPTIDE - Abnormal; Notable for the following components:   B Natriuretic Peptide 264.6 (*)    All other components within normal limits  I-STAT TROPONIN, ED  I-STAT CG4 LACTIC ACID, ED  I-STAT CG4 LACTIC ACID, ED    EKG  EKG Interpretation  Date/Time:  Sunday December 19 2017 12:11:47 EST Ventricular Rate:  125 PR Interval:    QRS Duration: 106 QT Interval:  292 QTC Calculation: 421 R Axis:   88 Text Interpretation:  recurrent  Atrial fibrillation  with rapid ventricular response with premature ventricular or aberrantly conducted complexes Incomplete left bundle branch block ST & T wave abnormality, consider inferolateral ischemia Reconfirmed by Blanchie Dessert [phone]) on 12/19/2017 2:49:55 PM       Radiology Dg Chest 2 View  Result Date: 12/19/2017 CLINICAL DATA:  Cough, shortness of breath EXAM: CHEST  2 VIEW COMPARISON:  CT chest dated 12/23/2016 FINDINGS: Chronic interstitial markings with mild subpleural reticulation/ fibrosis of the lung bases. No focal consolidation. No pleural effusion or pneumothorax. Mild eventration of the right posterior hemidiaphragm. Mild cardiomegaly. Postsurgical changes related to prior CABG. Prosthetic valve. Mild degenerative changes of the visualized thoracolumbar spine. Median sternotomy. IMPRESSION: No evidence of acute cardiopulmonary disease. Electronically Signed   By: Julian Hy M.D.   On: 12/19/2017 12:43    Procedures Procedures (including critical care time)  Medications Ordered in ED Medications  diltiazem (CARDIZEM) 1 mg/mL load via infusion 10 mg (10 mg Intravenous Bolus from Bag 12/19/17 1435)    And  diltiazem (CARDIZEM) 100 mg in dextrose 5% 152mL (1 mg/mL) infusion (5 mg/hr Intravenous New Bag/Given 12/19/17 1436)  albuterol (PROVENTIL) (2.5 MG/3ML) 0.083% nebulizer solution 5 mg (5 mg Nebulization Given 12/19/17 1432)  ipratropium (ATROVENT) nebulizer solution 0.5 mg (0.5 mg Nebulization Given 12/19/17 1432)  predniSONE (DELTASONE) tablet 60 mg (60 mg Oral Given 12/19/17 1432)     Initial Impression / Assessment and Plan / ED Course  I have reviewed the triage vital signs and the nursing notes.  Pertinent labs & imaging results that were available during my care of the patient were reviewed by me and considered in my medical decision making (see chart for details).     Elderly male with multiple medical problems including intermittent atrial fibrillation who is anticoagulated,  coronary artery disease and diabetes presenting with symptoms suggestive of initial URI which is not getting better.  He has had productive cough, wheezing and shortness of breath.  Patient is also found to be in atrial fibrillation with RVR.  He is not aware that he is in atrial fibrillation chronically and states he was cardioverted 2 years ago and was unaware if he came out of sinus rhythm.  His biggest complaint has been shortness of breath.  Patient is unable to walk across his home without becoming winded.  Patient is wheezing diffusely on and mildly tachypneic.  He displays no signs of fluid overload.  Will give prednisone, albuterol and Atrovent.  We will also start patient on a Cardizem drip for rhythm control.  Patient's x-ray without evidence of pneumonia.  BNP mildly elevated at 260, CBC, CMP without acute findings.  Lactic acid within normal limits.  3:25 PM On repeat evaluation patient still has significant wheezing.  Heart rate is improved on Cardizem with a rate of 105.  Will admit for further care.  CRITICAL CARE Performed by: Daysi Boggan Total critical care time: 30 minutes Critical care time was exclusive of separately billable procedures and treating other patients. Critical care was necessary to treat or prevent imminent or life-threatening deterioration. Critical care was time spent personally by me on the following activities: development of treatment plan with patient and/or surrogate as well as nursing, discussions with consultants, evaluation of patient's response to treatment, examination of patient, obtaining history from patient or surrogate, ordering and performing treatments and interventions, ordering and review of laboratory studies, ordering and review of radiographic studies, pulse oximetry and re-evaluation of patient's condition.   Final Clinical Impressions(s) /  ED Diagnoses   Final diagnoses:  Bronchitis  Atrial fibrillation, unspecified type [facility])     ED Discharge Orders    None       Blanchie Dessert, MD 12/19/17 [phone]

## 2017-12-19 NOTE — ED Notes (Signed)
I Stat Lactic Acid results shown to Dr. Lita Mains, and Roselyn Reef charge nurse.

## 2017-12-19 NOTE — ED Notes (Signed)
CRITICAL VALUE ALERT  Critical Value:  INR 5.1  Date & Time Notied:  12/19/2017  Provider Notified: Dyanne Carrel, NP

## 2017-12-19 NOTE — H&P (Signed)
History and Physical    Nathaniel [name] YIF:[id number] DOB: 1932/05/26 DOA: 12/19/2017  PCP: Lorne Skeens, MD Patient coming from: home  Chief Complaint: sob/cough/  HPI: Nathaniel [name] is a 84 y.o. male with medical history significant for A. fib, CAD status post CABG, CHF, diabetes, GERD, aVR on Coumadin presents to the emergency Department chief complaint persistent shortness of breath and cough. Initial evaluation reveals atrial fibrillation with rapid ventricular response. Right hospitalists are asked to admit  Information is obtained from the patient and the chart. He states he is had nasal congestion and chest congestion for the last week or so. He has completed a five-day course of antibiotics. He got no better and in fact became worse yesterday. Associated symptoms include productive cough or wheezing intermittent subjective fever. He states he was also being treated with Tamiflu. He denies chest pain palpitations lower extremity edema. He does endorse some orthopnea but that is not new for him. He denies abdominal pain nausea vomiting diarrhea constipation melena bright red blood per rectum. He denies headache dizziness syncope or near-syncope. He does admit to "falling out of my chair" last week but describes a mechanical fall. Reports he's been eating and eating his normal amount and denies any unintentional weight loss.    ED Course: The emergency department he's afebrile EKG reveals A. fib with rapid ventricular response a pressure the low end of normal and he's not hypoxic  Review of Systems: As per HPI otherwise all other systems reviewed and are negative.   Ambulatory Status: Ambulates at home without assistance is independent with ADLs  Past Medical History:  Diagnosis Date  . Atrial fibrillation ([place])   . Atrial fibrillation ([place])   . CAD (coronary artery disease)   . Cataract   . CHF (congestive heart failure) ([place])   . Colon polyps    adenomatous  . Diabetes  mellitus   . Diverticulosis   . GERD (gastroesophageal reflux disease)   . Gout   . Hernia, abdominal   . Hx: UTI (urinary tract infection)   . Hyperlipemia   . Hypertension   . Internal hemorrhoids   . Peripheral vascular disease ([place])   . Shortness of breath     Past Surgical History:  Procedure Laterality Date  . ABDOMINAL AORTIC ENDOVASCULAR STENT GRAFT N/A 04/13/2014   Procedure: ABDOMINAL AORTIC ENDOVASCULAR STENT GRAFT;  Surgeon: Serafina Mitchell, MD;  Location: [place];  Service: Vascular;  Laterality: N/A;  . ANGIOPLASTY     Unsure if he had stents put in   . APPENDECTOMY    . CARDIAC VALVE SURGERY    . CARDIOVERSION N/A 06/06/2014   Procedure: CARDIOVERSION;  Surgeon: Larey Dresser, MD;  Location: [facility] ENDOSCOPY;  Service: Cardiovascular;  Laterality: N/A;  . CARDIOVERSION N/A 08/21/2014   Procedure: CARDIOVERSION;  Surgeon: Larey Dresser, MD;  Location: [place];  Service: Cardiovascular;  Laterality: N/A;  . CORONARY ARTERY BYPASS GRAFT    . EYE SURGERY Bilateral    cataract surgery  . HERNIA REPAIR     2 operations  . LEFT HEART CATHETERIZATION WITH CORONARY/GRAFT ANGIOGRAM N/A 07/10/2014   Procedure: LEFT HEART CATHETERIZATION WITH Beatrix Fetters;  Surgeon: Larey Dresser, MD;  Location: [facility] CATH LAB;  Service: Cardiovascular;  Laterality: N/A;  . TONSILLECTOMY    . VIDEO BRONCHOSCOPY Bilateral 12/25/2015   Procedure: VIDEO BRONCHOSCOPY WITHOUT FLUORO;  Surgeon: Javier Glazier, MD;  Location: [place];  Service: Cardiopulmonary;  Laterality: Bilateral;  Social History   Socioeconomic History  . Marital status: Widowed    Spouse name: Not on file  . Number of children: Not on file  . Years of education: Not on file  . Highest education level: Not on file  Social Needs  . Financial resource strain: Not on file  . Food insecurity - worry: Not on file  . Food insecurity - inability: Not on file  . Transportation needs - medical: Not on file    . Transportation needs - non-medical: Not on file  Occupational History  . Occupation: [employer]   Tobacco Use  . Smoking status: Former Smoker    Packs/day: 0.25    Years: 20.00    Pack years: 5.00    Types: Cigarettes    Last attempt to quit: 03/23/1992    Years since quitting: 25.7  . Smokeless tobacco: Never Used  Substance and Sexual Activity  . Alcohol use: Yes    Alcohol/week: 6.0 oz    Types: 10 Glasses of wine per week    Comment: 1-2 drinks daily   . Drug use: No  . Sexual activity: Not on file  Other Topics Concern  . Not on file  Social History Narrative   1 caffeine drink daily     No Known Allergies  Family History  Problem Relation Age of Onset  . Heart disease Mother   . Heart disease Father        before age 66  . AAA (abdominal aortic aneurysm) Father   . Colon cancer Neg Hx     Prior to Admission medications   Medication Sig Start Date End Date Taking? Authorizing Provider  allopurinol (ZYLOPRIM) 300 MG tablet Take 300 mg by mouth daily.  05/13/17   [provider]  atorvastatin (LIPITOR) 10 MG tablet Take 1 tablet (10 mg total) by mouth daily. Patient taking differently: Take 10 mg by mouth every other day.  06/25/17   Larey Dresser, MD  atorvastatin (LIPITOR) 20 MG tablet TAKE ONE TABLET EVERY DAY Patient taking differently: TAKE ONE TABLET BY MOUTH EVERY OTHER DAY 07/19/17   Larey Dresser, MD  doxazosin (CARDURA) 1 MG tablet Take 2 tablets BY MOUTH in THE MORNING and 1 tablet in THE EVENING  for blood pressure and urinary symptoms    [provider]  ezetimibe (ZETIA) 10 MG tablet Take 10 mg by mouth daily.     [provider]  finasteride (PROSCAR) 5 MG tablet Take 5 mg by mouth daily.    [provider]  furosemide (LASIX) 20 MG tablet TAKE ONE TABLET ALTERNATING WITH 40MG  INTHE MORNING 02/08/17   Larey Dresser, MD  furosemide (LASIX) 40 MG tablet TAKE ONE TABLET ALTERNATING WITH 20MG  INTHE MORNING  06/23/17   Bensimhon, Shaune Pascal, MD  ramipril (ALTACE) 2.5 MG capsule Take one cap daily with Ramipril 5mg  cap daily (total dose 7.5mg  daily) Patient taking differently: Take 2.5 mg by mouth every evening. Take one cap (2.5 mg) in the evening 02/02/17   Larey Dresser, MD  ramipril (ALTACE) 5 MG capsule Take one capsule daily with Ramipril 2.5mg  tablet daily (total dose 7.5mg  daily) Patient taking differently: Take 5 mg by mouth every morning. Take 1 capsule (5 mg) by mouth in the morning 02/02/17   Larey Dresser, MD  sildenafil (VIAGRA) 50 MG tablet Take 50 mg by mouth daily as needed for erectile dysfunction.     [provider]  sitaGLIPtin (JANUVIA) 100  MG tablet Take 100 mg by mouth daily.    [provider]  spironolactone (ALDACTONE) 25 MG tablet TAKE ONE TABLET EACH DAY 08/20/17   Larey Dresser, MD  tamsulosin (FLOMAX) 0.4 MG CAPS capsule Take 0.4 mg by mouth daily. 08/24/17   [provider]  timolol (TIMOPTIC) 0.5 % ophthalmic solution Place 1 drop into both eyes 2 (two) times daily.  05/13/17   [provider]  warfarin (COUMADIN) 3 MG tablet Take 2 tablets (6 mg) by mouth on Sun, Tues, Thurs and Sat and Take 3 tablets (9 mg) by mouth on MWF 11/11/17   Larey Dresser, MD    Physical Exam: Vitals:   12/19/17 1459 12/19/17 1501 12/19/17 1515 12/19/17 1545  BP:  122/62 118/78 116/61  Pulse: (!) 107  (!) 33 (!) 117  Resp: 20  (!) 27 17  Temp:      TempSrc:      SpO2: 100% 100% 100% 96%  Weight:      Height:         General:  Appears somewhat ill slightly anxious in no acute distress, face is flushed Eyes:  PERRL, EOMI, normal lids, iris ENT:  grossly normal hearing, lips & tongue, mucous membranes of his mouth are pink but dry Neck:  no LAD, masses or thyromegaly Cardiovascular:  Irregularly irregular, no m/r/g. No LE edema.  Respiratory:  Mild increased work of breathing with conversation breath sounds with diffuse wheezing fair air  movement no crackles Abdomen:  soft, ntnd, positive bowel sounds throughout no guarding or rebounding Skin:  no rash or induration seen on limited exam Musculoskeletal:  grossly normal tone BUE/BLE, good ROM, no bony abnormality Psychiatric:  grossly normal mood and affect, speech fluent and appropriate, AOx3 Neurologic:  CN 2-12 grossly intact, moves all extremities in coordinated fashion, sensation intact speech clear facial symmetry bilateral grip 5 out of 5  Labs on Admission: I have personally reviewed following labs and imaging studies  CBC: Recent Labs  Lab 12/19/17 1219  WBC 7.3  HGB 12.3*  HCT 39.0  MCV 85.3  PLT 149   Basic Metabolic Panel: Recent Labs  Lab 12/19/17 1219  NA 133*  K 4.0  CL 100*  CO2 24  GLUCOSE 140*  BUN 16  CREATININE 0.92  CALCIUM 8.6*   GFR: Estimated Creatinine Clearance: 63.8 mL/min (by C-G formula based on SCr of 0.92 mg/dL). Liver Function Tests: No results for input(s): AST, ALT, ALKPHOS, BILITOT, PROT, ALBUMIN in the last 168 hours. No results for input(s): LIPASE, AMYLASE in the last 168 hours. No results for input(s): AMMONIA in the last 168 hours. Coagulation Profile: Recent Labs  Lab 12/19/17 1238  INR 5.16*   Cardiac Enzymes: No results for input(s): CKTOTAL, CKMB, CKMBINDEX, TROPONINI in the last 168 hours. BNP (last 3 results) No results for input(s): PROBNP in the last 8760 hours. HbA1C: No results for input(s): HGBA1C in the last 72 hours. CBG: No results for input(s): GLUCAP in the last 168 hours. Lipid Profile: No results for input(s): CHOL, HDL, LDLCALC, TRIG, CHOLHDL, LDLDIRECT in the last 72 hours. Thyroid Function Tests: No results for input(s): TSH, T4TOTAL, FREET4, T3FREE, THYROIDAB in the last 72 hours. Anemia Panel: No results for input(s): VITAMINB12, FOLATE, FERRITIN, TIBC, IRON, RETICCTPCT in the last 72 hours. Urine analysis:    Component Value Date/Time   COLORURINE RED (A) 08/12/2017 2158    APPEARANCEUR CLOUDY (A) 08/12/2017 2158   LABSPEC 1.015 08/12/2017 2158  PHURINE 6.0 08/12/2017 2158   GLUCOSEU 50 (A) 08/12/2017 2158   HGBUR MODERATE (A) 08/12/2017 2158   BILIRUBINUR NEGATIVE 08/12/2017 2158   KETONESUR 5 (A) 08/12/2017 2158   PROTEINUR 100 (A) 08/12/2017 2158   UROBILINOGEN 0.2 04/12/2014 1244   NITRITE NEGATIVE 08/12/2017 2158   LEUKOCYTESUR NEGATIVE 08/12/2017 2158    Creatinine Clearance: Estimated Creatinine Clearance: 63.8 mL/min (by C-G formula based on SCr of 0.92 mg/dL).  Sepsis Labs: @LABRCNTIP (procalcitonin:4,lacticidven:4) )No results found for this or any previous visit (from the past 240 hour(s)).   Radiological Exams on Admission: Dg Chest 2 View  Result Date: 12/19/2017 CLINICAL DATA:  Cough, shortness of breath EXAM: CHEST  2 VIEW COMPARISON:  CT chest dated 12/23/2016 FINDINGS: Chronic interstitial markings with mild subpleural reticulation/ fibrosis of the lung bases. No focal consolidation. No pleural effusion or pneumothorax. Mild eventration of the right posterior hemidiaphragm. Mild cardiomegaly. Postsurgical changes related to prior CABG. Prosthetic valve. Mild degenerative changes of the visualized thoracolumbar spine. Median sternotomy. IMPRESSION: No evidence of acute cardiopulmonary disease. Electronically Signed   By: Julian Hy M.D.   On: 12/19/2017 12:43    EKG: recurrent Atrial fibrillation with rapid ventricular response with premature ventricular or aberrantly conducted complexes Incomplete left bundle branch block ST & T wave abnormality  Assessment/Plan Principal Problem:   Atrial fibrillation with rapid ventricular response (HCC) Active Problems:   CAD (coronary artery disease) of artery bypass graft   Abdominal aortic aneurysm (HCC)   History of aortic valve replacement with bioprosthetic valve   Essential hypertension, malignant   Chronic diastolic CHF (congestive heart failure) (HCC)   Chronic systolic CHF  (congestive heart failure) (HCC)   Cough   Acute bronchitis   #1. Atrial fibrillation with rapid ventricular response. Likely related to upper respiratory infection/virus. Patient with history of same. Has been in sinus rhythm of late per chart review. He was on metoprolol in the past but is not now. Initial troponin negative. EKG as noted above. Chest x-ray without cardiopulmonary process. He was provided with Cardizem loading dose and Cardizem drip in the emergency department. At the time of admission heart rate 120 and Cardizem drip. [place] score 4. Home meds include coumadin. inr 5.16. -Admit to telemetry -Continue Cardizem drip -Hold Coumadin -Transition to oral rate control med when appropriate -Obtain a TSH  #2. Cough/acute bronchitis/respiratory virus. Patient treated outpatient with 5 days antibiotic and Tamiflu. Symptoms persisted. No leukocytosis afebrile hemodynamically stable and not hypoxic. Patient with frequent productive cough during exam. Diffuse rhonchi wheeze to auscultation. He was provided with nebulizer treatment and reports breathing improved since then -Scheduled nebulizers -Solu-Medrol -Levaquin -Follow respiratory panel -Monitor oxygen saturation level -Oxygen supplementation as indicated  #3. CAD status post CABG and redo CABG/AAA status post repair/carotid stenosis status post aVR. No chest pain denies palpitations initial troponin negative EKG as noted above. Chart review indicates patient in sinus rhythm since reinversion in 2015. He was a one-time on amiodarone but is no longer secondary to side effects. At one time he was on Toprol-XL but this was discontinued due to fatigue. Home medications include Lipitor, Zetia, Lasix, ramapril and spironolactone.will hold ramapril and lasix and spironolactone for now -Resistive meds as indicated -Cycle troponin -Monitor on telemetry  #4. Fall. Patient suffered a mechanical fall last week. He reports he slid out of his chair  as a cushion in the seat was not centered, patient with bruise and swelling on forehead as well as scab formation. He is on Coumadin  with a supratherapeutic INR. Neuro exam benign -Obtain a CT of the head -Holding Coumadin for now -coumadin per pharmacy  5. Chronic combined systolic diastolic heart failure. Stable. bnp 264 Echo done in February 2018 reveals an EF of 45% moderate LVH grade 1 diastolic dysfunction. Home meds include ACE inhibitor Lasix spironolactone. Holding above medications for now -Monitor intake and output -Obtain daily weights -Resume ACE inhibitor and diuretics when indicated  #6. Hypertension. Fair control in the emergency department. Patient is on a Cardizem drip for A. fib and RVR. home medications as noted above -monitor -resume home meds as indicated    DVT prophylaxis: scd  Code Status: full  Family Communication: none present  Disposition Plan: home when ready  Consults called: none  Admission status: inpatient    Radene Gunning MD [facility]  If 7PM-7AM, please contact night-coverage [email or web address] Password [facility]  12/19/2017, 4:34 PM

## 2017-12-19 NOTE — Progress Notes (Signed)
Nathaniel [name] for warfarin Indication: atrial fibrillation  No Known Allergies  Patient Measurements: Height: 5' 10.5" (179.1 cm) Weight: 188 lb (85.3 kg) IBW/kg (Calculated) : 74.15  Vital Signs: Temp: 97.6 F (36.4 C) (01/06 1202) Temp Source: Oral (01/06 1202) BP: 116/61 (01/06 1545) Pulse Rate: 117 (01/06 1545)  Labs: Recent Labs    12/19/17 1219 12/19/17 1238  HGB 12.3*  --   HCT 39.0  --   PLT 229  --   LABPROT  --  47.2*  INR  --  5.16*  CREATININE 0.92  --     Estimated Creatinine Clearance: 63.8 mL/min (by C-G formula based on SCr of 0.92 mg/dL).   Medical History: Past Medical History:  Diagnosis Date  . Atrial fibrillation ([place])   . Atrial fibrillation ([place])   . CAD (coronary artery disease)   . Cataract   . CHF (congestive heart failure) ([place])   . Colon polyps    adenomatous  . Diabetes mellitus   . Diverticulosis   . GERD (gastroesophageal reflux disease)   . Gout   . Hernia, abdominal   . Hx: UTI (urinary tract infection)   . Hyperlipemia   . Hypertension   . Internal hemorrhoids   . Peripheral vascular disease ([place])   . Shortness of breath     Assessment: 85 yo male on warfarin PTA for hx of afib admitted 1/6 with concern for URI and afib with RVR. INR on admission 5.16. CBC stable.   Goal of Therapy:  INR 2-3 Monitor platelets by anticoagulation protocol: Yes   Plan:  Hold warfarin this evening Daily INR  Nathaniel [name] 12/19/2017,4:13 PM

## 2017-12-20 ENCOUNTER — Telehealth: Payer: Self-pay | Admitting: Adult Health

## 2017-12-20 DIAGNOSIS — J209 Acute bronchitis, unspecified: Secondary | ICD-10-CM

## 2017-12-20 DIAGNOSIS — I4891 Unspecified atrial fibrillation: Principal | ICD-10-CM

## 2017-12-20 DIAGNOSIS — J40 Bronchitis, not specified as acute or chronic: Secondary | ICD-10-CM

## 2017-12-20 DIAGNOSIS — R05 Cough: Secondary | ICD-10-CM

## 2017-12-20 DIAGNOSIS — I5043 Acute on chronic combined systolic (congestive) and diastolic (congestive) heart failure: Secondary | ICD-10-CM

## 2017-12-20 DIAGNOSIS — Z953 Presence of xenogenic heart valve: Secondary | ICD-10-CM

## 2017-12-20 DIAGNOSIS — J9601 Acute respiratory failure with hypoxia: Secondary | ICD-10-CM

## 2017-12-20 LAB — RESPIRATORY PANEL BY PCR
ADENOVIRUS-RVPPCR: NOT DETECTED
Bordetella pertussis: NOT DETECTED
CORONAVIRUS HKU1-RVPPCR: NOT DETECTED
CORONAVIRUS OC43-RVPPCR: NOT DETECTED
Chlamydophila pneumoniae: NOT DETECTED
Coronavirus 229E: NOT DETECTED
Coronavirus NL63: NOT DETECTED
Influenza A: NOT DETECTED
Influenza B: NOT DETECTED
METAPNEUMOVIRUS-RVPPCR: DETECTED — AB
Mycoplasma pneumoniae: NOT DETECTED
PARAINFLUENZA VIRUS 1-RVPPCR: NOT DETECTED
PARAINFLUENZA VIRUS 2-RVPPCR: NOT DETECTED
Parainfluenza Virus 3: NOT DETECTED
Parainfluenza Virus 4: NOT DETECTED
Respiratory Syncytial Virus: NOT DETECTED
Rhinovirus / Enterovirus: NOT DETECTED

## 2017-12-20 LAB — GLUCOSE, CAPILLARY
GLUCOSE-CAPILLARY: 170 mg/dL — AB (ref 65–99)
Glucose-Capillary: 192 mg/dL — ABNORMAL HIGH (ref 65–99)
Glucose-Capillary: 229 mg/dL — ABNORMAL HIGH (ref 65–99)
Glucose-Capillary: 255 mg/dL — ABNORMAL HIGH (ref 65–99)

## 2017-12-20 LAB — POCT I-STAT TROPONIN I: TROPONIN I, POC: 0.01 ng/mL (ref 0.00–0.08)

## 2017-12-20 LAB — PROTIME-INR
INR: 4.58
Prothrombin Time: 43 s — ABNORMAL HIGH (ref 11.4–15.2)

## 2017-12-20 LAB — CBC
HCT: 37.1 % — ABNORMAL LOW (ref 39.0–52.0)
Hemoglobin: 11.7 g/dL — ABNORMAL LOW (ref 13.0–17.0)
MCH: 27 pg (ref 26.0–34.0)
MCHC: 31.5 g/dL (ref 30.0–36.0)
MCV: 85.7 fL (ref 78.0–100.0)
Platelets: 215 K/uL (ref 150–400)
RBC: 4.33 MIL/uL (ref 4.22–5.81)
RDW: 15.4 % (ref 11.5–15.5)
WBC: 4.1 K/uL (ref 4.0–10.5)

## 2017-12-20 LAB — BASIC METABOLIC PANEL
ANION GAP: 8 (ref 5–15)
BUN: 16 mg/dL (ref 6–20)
CALCIUM: 8.5 mg/dL — AB (ref 8.9–10.3)
CO2: 27 mmol/L (ref 22–32)
CREATININE: 0.86 mg/dL (ref 0.61–1.24)
Chloride: 99 mmol/L — ABNORMAL LOW (ref 101–111)
Glucose, Bld: 187 mg/dL — ABNORMAL HIGH (ref 65–99)
Potassium: 4.9 mmol/L (ref 3.5–5.1)
SODIUM: 134 mmol/L — AB (ref 135–145)

## 2017-12-20 LAB — TROPONIN I

## 2017-12-20 LAB — CG4 I-STAT (LACTIC ACID): LACTIC ACID, VENOUS: 2.21 mmol/L — AB (ref 0.5–1.9)

## 2017-12-20 MED ORDER — SPIRONOLACTONE 12.5 MG HALF TABLET
12.5000 mg | ORAL_TABLET | Freq: Every day | ORAL | Status: DC
  Administered 2017-12-21 – 2017-12-22 (×2): 12.5 mg via ORAL
  Filled 2017-12-20 (×3): qty 1

## 2017-12-20 MED ORDER — SODIUM CHLORIDE 0.9 % IV SOLN
250.0000 mL | INTRAVENOUS | Status: DC
  Administered 2017-12-22: 250 mL via INTRAVENOUS
  Administered 2017-12-22: 12:00:00 via INTRAVENOUS

## 2017-12-20 MED ORDER — FUROSEMIDE 10 MG/ML IJ SOLN
40.0000 mg | Freq: Two times a day (BID) | INTRAMUSCULAR | Status: DC
  Administered 2017-12-20 – 2017-12-22 (×4): 40 mg via INTRAVENOUS
  Filled 2017-12-20 (×4): qty 4

## 2017-12-20 MED ORDER — SODIUM CHLORIDE 0.9% FLUSH
3.0000 mL | Freq: Two times a day (BID) | INTRAVENOUS | Status: DC
  Administered 2017-12-22: 3 mL via INTRAVENOUS

## 2017-12-20 MED ORDER — SODIUM CHLORIDE 0.9% FLUSH: 3.0000 mL | INTRAVENOUS | Status: DC | PRN

## 2017-12-20 MED ORDER — FUROSEMIDE 40 MG PO TABS
40.0000 mg | ORAL_TABLET | Freq: Every day | ORAL | Status: DC
Start: 2017-12-20 — End: 2017-12-20
  Administered 2017-12-20: 40 mg via ORAL
  Filled 2017-12-20: qty 1

## 2017-12-20 NOTE — Consult Note (Signed)
Advanced Heart Failure Team Consult Note   Primary Physician: Dr. Elyse Hsu Primary Cardiologist:  Dr. Aundra Dubin  Reason for Consultation: A/C systolic and diastolic heart failure   HPI:    Nathaniel [name] is seen today for evaluation of a/c systolic and diastolic heart failure at the request of Dr. Alfredia Ferguson.   Nathaniel [name] is a 84 y.o. male with a medial history of A fib on coumadin (NSR since DCCV 08/2014), CAD s/p CABG (1989) and redo CABG (2007), AAA s/p repair (2015), CHF, DM, PAD, carotid stenosis, and bioprothestic AVR (2007).  He was doing well at his last appointment with Dr. Aundra Dubin in 12/2016. He was in NSR. No CP/SOB. No PND. Active. Rare claudication. Off toprol XL due to fatigue. Off amio due to side effects. No changes were made to his medications.  Pt went to PCP about 10 days ago with productive cough with brown sputum and SOB with minimal activity. He had no fever, but did have chills. He was prescribed an antibiotic (he can't remember name) and tamiflu and completed both. His PCP also recommended that he make an appointment with Dr. Aundra Dubin. On 12/19/16, he presented to Urgent Care with worsening SOB and he was sent to [facility] ED. He was found to be in A fib with RVR. Pertinent admission labs include: Na 133, K 4.0, Creatinine 0.92, WBC 7.3, Hemoglobin 12.3, BNP 264.6, TSH 0.633, troponin negative, and INR 5.16.    He reports gradual worsening of SOB since a prostate procedure in September. He is weighed at the coumadin clinic and is usually around 198 lbs. He does not weigh himself at home. He always sleeps on 3 pillows and may have PND. He has had two recent mechanical falls (one he slipped off his chair and the other he slipped on wet floor in the bathroom). No dizziness or lightheadedness prior to falls or otherwise. No CP or palpitations. Generalized weakness.   CXR 12/19/2017: No evidence of acute cardiopulmonary disease.  EKG 12/19/2017: A fib RVR 125 bmp with incomplete LBBB.  Personally reviewed. EKG 07/30/2017: NSR with PVC's and incomplete LBBB  Echo 02/03/2017 - Left ventricle: The cavity size was normal. Wall thickness was   increased in a pattern of moderate LVH. Systolic function was   mildly reduced. The estimated ejection fraction was in the range   of 45% to 50%. Doppler parameters are consistent with abnormal   left ventricular relaxation (grade 1 diastolic dysfunction). - Aortic valve: Bioprostetic AVR - no obstruction. - Mitral valve: Calcified annulus. Mildly thickened leaflets .   There was trivial regurgitation. - Left atrium: The atrium was at the upper limits of normal in   size. - Right ventricle: The cavity size was mildly dilated. Systolic   function is reduced. - Right atrium: Moderately dilated. - Inferior vena cava: The vessel was normal in size. The   respirophasic diameter changes were in the normal range (>= 50%),   consistent with normal central venous pressure.  Echo 12/25/2014 - Left ventricle: The cavity size was normal. Wall thickness was increased in a pattern of mild LVH. Systolic function was normal. The estimated ejection fraction was in the range of 55% to 60%. There is hypokinesis of the basalinferior myocardium. Features are consistent with a pseudonormal left ventricular filling pattern, with concomitant abnormal relaxation and increased filling pressure (grade 2 diastolic dysfunction). Doppler parameters are consistent with high ventricular filling pressure. - Aortic valve: A bioprosthesis was present. - Ascending aorta: The ascending  aorta was mildly dilated. - Mitral valve: Severely calcified annulus. Mildly thickened leaflets . There was mild regurgitation. - Left atrium: The atrium was severely dilated. - Right atrium: The atrium was mildly dilated.  LHC 06/2014: LIMA-LAD patent with 40-50% stenosis in LAD after touchdown, sequential SVG-ramus/OM/diagonal with only the ramus branch still  intact (known from prior cath), SVG-PDA from original surgery TO, SVG-PDA from redo surgery with long 50-60% mid-graft stenosis.  No target for intervention.    Review of Systems: [y] = yes, [ ]  = no   General: Weight gain [ ] ; Weight loss Blue.Reese ]; Anorexia [ ] ; Fatigue Blue.Reese ]; Fever [ ] ; Chills Blue.Reese ]; Weakness Blue.Reese ]  Cardiac: Chest pain/pressure [ ] ; Resting SOB [ ] ; Exertional SOB Blue.Reese ]; Orthopnea [ ] ; Pedal Edema [ ] ; Palpitations [ ] ; Syncope [ ] ; Presyncope [ ] ; Paroxysmal nocturnal dyspnea[ ]   Pulmonary: Cough Blue.Reese ]; Wheezing[ ] ; Hemoptysis[ ] ; Sputum [ ] ; Snoring [ ]   GI: Vomiting[ ] ; Dysphagia[ ] ; Melena[ ] ; Hematochezia [ ] ; Heartburn[ ] ; Abdominal pain [ ] ; Constipation [ ] ; Diarrhea [ ] ; BRBPR [ ]   GU: Hematuria[ ] ; Dysuria [ ] ; Nocturia[ ]   Vascular: Pain in legs with walking [ ] ; Pain in feet with lying flat [ ] ; Non-healing sores [ ] ; Stroke [ ] ; TIA [ ] ; Slurred speech [ ] ;  Neuro: Headaches[ ] ; Vertigo[ ] ; Seizures[ ] ; Paresthesias[ ] ;Blurred vision [ ] ; Diplopia [ ] ; Vision changes [ ]   Ortho/Skin: Arthritis [ ] ; Joint pain [ ] ; Muscle pain [ ] ; Joint swelling [ ] ; Back Pain [ ] ; Rash [ ]   Psych: Depression[ ] ; Anxiety[ ]   Heme: Bleeding problems [ ] ; Clotting disorders [ ] ; Anemia [ ]   Endocrine: Diabetes Blue.Reese ]; Thyroid dysfunction[ ]   Home Medications Prior to Admission medications   Medication Sig Start Date End Date Taking? Authorizing Provider  allopurinol (ZYLOPRIM) 300 MG tablet Take 300 mg by mouth daily.  05/13/17  Yes [provider]  atorvastatin (LIPITOR) 10 MG tablet Take 1 tablet (10 mg total) by mouth daily. Patient taking differently: Take 10 mg by mouth every other day.  06/25/17  Yes Larey Dresser, MD  atorvastatin (LIPITOR) 20 MG tablet TAKE ONE TABLET EVERY DAY Patient taking differently: Take 20 mg by mouth every other day 07/19/17  Yes Larey Dresser, MD  Coenzyme Q10 (CO Q10 PO) Take 1 capsule by mouth daily.   Yes [provider]  doxazosin  (CARDURA) 1 MG tablet Take 1 mg by mouth in the evening   Yes [provider]  doxazosin (CARDURA) 2 MG tablet Take 2 mg by mouth every morning.   Yes [provider]  ezetimibe (ZETIA) 10 MG tablet Take 10 mg by mouth daily.    Yes [provider]  finasteride (PROSCAR) 5 MG tablet Take 5 mg by mouth daily.   Yes [provider]  furosemide (LASIX) 20 MG tablet TAKE ONE TABLET ALTERNATING WITH 40MG  INTHE MORNING Patient taking differently: Take 20 mg by mouth every other morning 02/08/17  Yes Larey Dresser, MD  furosemide (LASIX) 40 MG tablet TAKE ONE TABLET ALTERNATING WITH 20MG  INTHE MORNING Patient taking differently: Take 40 mg by mouth every other morning 06/23/17  Yes Bensimhon, Shaune Pascal, MD  glucosamine-chondroitin 500-400 MG tablet Take 1 tablet by mouth daily.   Yes [provider]  Multiple Vitamins-Minerals (OCUVITE PO) Take 1 tablet by mouth daily.   Yes [provider]  Omega-3 Fatty  Acids (OMEGA-3 PO) Take 1 capsule by mouth daily.   Yes [provider]  ramipril (ALTACE) 2.5 MG capsule Take one cap daily with Ramipril 5mg  cap daily (total dose 7.5mg  daily) Patient taking differently: Take 2.5 mg by mouth at bedtime.  02/02/17  Yes Larey Dresser, MD  ramipril (ALTACE) 5 MG capsule Take one capsule daily with Ramipril 2.5mg  tablet daily (total dose 7.5mg  daily) Patient taking differently: Take 5 mg by mouth every morning.  02/02/17  Yes Larey Dresser, MD  sitaGLIPtin (JANUVIA) 100 MG tablet Take 100 mg by mouth every other day.   Yes [provider]  spironolactone (ALDACTONE) 25 MG tablet TAKE ONE TABLET EACH DAY Patient taking differently: Take 25 mg by mouth once a day 08/20/17  Yes Larey Dresser, MD  tamsulosin (FLOMAX) 0.4 MG CAPS capsule Take 0.4 mg by mouth daily. 08/24/17  Yes [provider]  timolol (TIMOPTIC) 0.5 % ophthalmic solution Place 1 drop into the right eye daily.  05/13/17  Yes  [provider]  warfarin (COUMADIN) 3 MG tablet Take 2 tablets (6 mg) by mouth on Sun, Tues, Thurs and Sat and Take 3 tablets (9 mg) by mouth on MWF Patient taking differently: Take 6-9 mg by mouth See admin instructions. 6 mg at bedtime on Sun/Tues/Thurs/Sat and 9 mg on Mon/Wed/Fri 11/11/17  Yes Larey Dresser, MD  sildenafil (VIAGRA) 50 MG tablet Take 50 mg by mouth daily as needed for erectile dysfunction.     [provider]    Past Medical History: Past Medical History:  Diagnosis Date  . Atrial fibrillation ([place])   . Atrial fibrillation ([place])   . CAD (coronary artery disease)   . Cataract   . CHF (congestive heart failure) ([place])   . Colon polyps    adenomatous  . Diabetes mellitus   . Diverticulosis   . GERD (gastroesophageal reflux disease)   . Gout   . Hernia, abdominal   . Hx: UTI (urinary tract infection)   . Hyperlipemia   . Hypertension   . Internal hemorrhoids   . Peripheral vascular disease ([place])   . Shortness of breath     Past Surgical History: Past Surgical History:  Procedure Laterality Date  . ABDOMINAL AORTIC ENDOVASCULAR STENT GRAFT N/A 04/13/2014   Procedure: ABDOMINAL AORTIC ENDOVASCULAR STENT GRAFT;  Surgeon: Serafina Mitchell, MD;  Location: [place];  Service: Vascular;  Laterality: N/A;  . ANGIOPLASTY     Unsure if he had stents put in   . APPENDECTOMY    . CARDIAC VALVE SURGERY    . CARDIOVERSION N/A 06/06/2014   Procedure: CARDIOVERSION;  Surgeon: Larey Dresser, MD;  Location[facility] ENDOSCOPY;  Service: Cardiovascular;  Laterality: N/A;  . CARDIOVERSION N/A 08/21/2014   Procedure: CARDIOVERSION;  Surgeon: Larey Dresser, MD;  Location: [place];  Service: Cardiovascular;  Laterality: N/A;  . CORONARY ARTERY BYPASS GRAFT    . EYE SURGERY Bilateral    cataract surgery  . HERNIA REPAIR     2 operations  . LEFT HEART CATHETERIZATION WITH CORONARY/GRAFT ANGIOGRAM N/A 07/10/2014   Procedure: LEFT HEART CATHETERIZATION WITH  Beatrix Fetters;  Surgeon: Larey Dresser, MD;  Location: [facility] CATH LAB;  Service: Cardiovascular;  Laterality: N/A;  . TONSILLECTOMY    . VIDEO BRONCHOSCOPY Bilateral 12/25/2015   Procedure: VIDEO BRONCHOSCOPY WITHOUT FLUORO;  Surgeon: Javier Glazier, MD;  Location: [place];  Service: Cardiopulmonary;  Laterality: Bilateral;    Family History: Family  History  Problem Relation Age of Onset  . Heart disease Mother   . Heart disease Father        before age 87  . AAA (abdominal aortic aneurysm) Father   . Colon cancer Neg Hx     Social History: Social History   Socioeconomic History  . Marital status: Widowed    Spouse name: None  . Number of children: None  . Years of education: None  . Highest education level: None  Social Needs  . Financial resource strain: None  . Food insecurity - worry: None  . Food insecurity - inability: None  . Transportation needs - medical: None  . Transportation needs - non-medical: None  Occupational History  . Occupation: [employer]   Tobacco Use  . Smoking status: Former Smoker    Packs/day: 0.25    Years: 20.00    Pack years: 5.00    Types: Cigarettes    Last attempt to quit: 03/23/1992    Years since quitting: 25.7  . Smokeless tobacco: Never Used  Substance and Sexual Activity  . Alcohol use: Yes    Alcohol/week: 6.0 oz    Types: 10 Glasses of wine per week    Comment: 1-2 drinks daily   . Drug use: No  . Sexual activity: None  Other Topics Concern  . None  Social History Narrative   1 caffeine drink daily     Allergies:  No Known Allergies  Objective:    Vital Signs:   Temp:  [97.6 F (36.4 C)-98.2 F (36.8 C)] 98.2 F (36.8 C) (01/06 2017) Pulse Rate:  [25-140] 122 (01/07 0900) Resp:  [13-34] 19 (01/07 0900) BP: (99-138)/(60-108) 99/66 (01/07 0900) SpO2:  [93 %-100 %] 97 % (01/07 0948) Weight:  [185 lb 3 oz (84 kg)-188 lb (85.3 kg)] 185 lb 3 oz (84 kg) (01/07 0400)    Weight change: Filed  Weights   12/19/17 1203 12/20/17 0400  Weight: 188 lb (85.3 kg) 185 lb 3 oz (84 kg)    Intake/Output:   Intake/Output Summary (Last 24 hours) at 12/20/2017 1033 Last data filed at 12/20/2017 0300 Gross per 24 hour  Intake 352.33 ml  Output -  Net 352.33 ml      Physical Exam    General:  Elderly. Intermittent coughing, but not labored. Scrapes on forehead from recent fall HEENT: normal. Wears glasses Neck: supple. JVP 7-8. Carotids 2+ bilat; no bruits. No lymphadenopathy or thyromegaly appreciated. Cor: PMI nondisplaced. Regular rate & rhythm. No rubs, gallops or murmurs. Irregular. Lungs: coarse, wheezing throughout. Abdomen: soft, nontender, + distension. No hepatosplenomegaly. No bruits or masses. Good bowel sounds. Extremities: no cyanosis, clubbing, rash, edema Neuro: alert & orientedx3, cranial nerves grossly intact. moves all 4 extremities w/o difficulty. Affect pleasant   Telemetry   A fib, rate 70-130's with occasional PVC's (2-6/min). Personally reviewed.  EKG    EKG 12/19/2017: A fib RVR 125 bmp with incomplete LBBB. Personally reviewed.  Labs   Basic Metabolic Panel: Recent Labs  Lab 12/19/17 1219 12/20/17 0304  NA 133* 134*  K 4.0 4.9  CL 100* 99*  CO2 24 27  GLUCOSE 140* 187*  BUN 16 16  CREATININE 0.92 0.86  CALCIUM 8.6* 8.5*    Liver Function Tests: No results for input(s): AST, ALT, ALKPHOS, BILITOT, PROT, ALBUMIN in the last 168 hours. No results for input(s): LIPASE, AMYLASE in the last 168 hours. No results for input(s): AMMONIA in the last 168 hours.  CBC: Recent  Labs  Lab 12/19/17 1219 12/20/17 0304  WBC 7.3 4.1  HGB 12.3* 11.7*  HCT 39.0 37.1*  MCV 85.3 85.7  PLT 229 215    Cardiac Enzymes: Recent Labs  Lab 12/19/17 1541 12/19/17 2142 12/20/17 0304  TROPONINI <0.03 <0.03 <0.03    BNP: BNP (last 3 results) Recent Labs    12/19/17 1219  BNP 264.6*    ProBNP (last 3 results) No results for input(s): PROBNP in the  last 8760 hours.   CBG: Recent Labs  Lab 12/19/17 2016 12/20/17 0618  GLUCAP 308* 192*    Coagulation Studies: Recent Labs    12/19/17 1238 12/20/17 0304  LABPROT 47.2* 43.0*  INR 5.16* 4.58*     Imaging   Dg Chest 2 View  Result Date: 12/19/2017 CLINICAL DATA:  Cough, shortness of breath EXAM: CHEST  2 VIEW COMPARISON:  CT chest dated 12/23/2016 FINDINGS: Chronic interstitial markings with mild subpleural reticulation/ fibrosis of the lung bases. No focal consolidation. No pleural effusion or pneumothorax. Mild eventration of the right posterior hemidiaphragm. Mild cardiomegaly. Postsurgical changes related to prior CABG. Prosthetic valve. Mild degenerative changes of the visualized thoracolumbar spine. Median sternotomy. IMPRESSION: No evidence of acute cardiopulmonary disease. Electronically Signed   By: Julian Hy M.D.   On: 12/19/2017 12:43   Ct Head Wo Contrast  Result Date: 12/19/2017 CLINICAL DATA:  Patient fell on Thursday hitting head. Abrasion to left forehead. EXAM: CT HEAD WITHOUT CONTRAST TECHNIQUE: Contiguous axial images were obtained from the base of the skull through the vertex without intravenous contrast. COMPARISON:  02/03/2008 FINDINGS: BRAIN: There is sulcal and ventricular prominence consistent with superficial and central atrophy. No intraparenchymal hemorrhage, mass effect nor midline shift. Periventricular and subcortical white matter hypodensities consistent with chronic small vessel ischemic disease are identified. No acute large vascular territory infarcts. No abnormal extra-axial fluid collections. Basal cisterns are not effaced and midline. VASCULAR: Mild bladder calcific atherosclerosis of the carotid siphons. No hyperdense vessels or unexpected calcifications. SKULL: No skull fracture. No significant scalp soft tissue swelling. SINUSES/ORBITS: The mastoid air-cells are clear. The included paranasal sinuses are well-aerated.The included ocular globes  and orbital contents are non-suspicious. Bilateral lens replacements. OTHER: Mild left supraorbital soft tissue swelling. IMPRESSION: Atrophy with chronic appearing small vessel ischemic disease. No acute intracranial abnormality. Mild left supraorbital soft swelling. Electronically Signed   By: Ashley Royalty M.D.   On: 12/19/2017 18:11      Medications:     Current Medications: . allopurinol  300 mg Oral Daily  . atorvastatin  10 mg Oral QODAY  . ezetimibe  10 mg Oral Daily  . finasteride  5 mg Oral Daily  . Influenza vac split quadrivalent PF  0.5 mL Intramuscular Tomorrow-1000  . insulin aspart  0-15 Units Subcutaneous TID WC  . insulin aspart  0-5 Units Subcutaneous QHS  . methylPREDNISolone (SOLU-MEDROL) injection  40 mg Intravenous Q12H  . pneumococcal 23 valent vaccine  0.5 mL Intramuscular Tomorrow-1000  . tamsulosin  0.4 mg Oral Daily  . timolol  1 drop Both Eyes BID     Infusions: . diltiazem (CARDIZEM) infusion 10 mg/hr (12/20/17 0744)       Patient Profile   LACY TAGLIERI is a 84 y.o. male with a medial history of A fib on coumadin (NSR since DCCV 08/2014), CAD s/p CABG (1989) and redo CABG (2007), AAA s/p repair (2015), CHF, DM, PAD, carotid stenosis, and bioprothestic AVR (2007).  He was admitted on 1/6 with worsening SOB  and found to be in A fib RVR.  Assessment/Plan   1. A fib with RVR - Continue Cardizem drip @ 10 mg/hr - Had hallucinations with amio and fatigue with beta blockers in the past. - TSH normal - On coumadin. Supratherapeutic INR 4.58 this am. Dosing per pharmacy. - INR has been therapeutic since 11/11/17. Plan for DC-CV 12/21/2017 at 8 am. - If unsuccessful or recurrence of a fib after DCCV, will likely start tikosyn. Will check mag level in am. - Likely that a fib was triggered by respiratory viral infection. Last EKG was 07/2017 at [facility] and he was in NSR.  2. Cough/Respiratory infection/Bronchitis - RSV pending. On droplet precautions -  Not requiring O2, Afebrile  - Being treated with solumedrol - Has completed coarse of antibiotics and tamiflu outpatient - ?Levaquin mentioned in H&P but not ordered. - Continue duonebs. Needs to be reordered.  3. Systolic and Diastolic Heart Failure, Suspected ICM, EF 45-50% Echo 12/2014 EF 55-60% with grade II DD; Echo 01/2017 EF 45-50% with grade I DD LHC 06/2014: LIMA-LAD patent with 40-50% stenosis in LAD after touchdown, sequential SVG-ramus/OM/diagonal with only the ramus branch still intact (known from prior cath), SVG-PDA from original surgery TO, SVG-PDA from redo surgery with long 50-60% mid-graft stenosis.  No target for intervention.  - NYHA class III, Volume status mildly elevated - Takes 40/20 alternating Lasix at home. Will start lasix 40 mg IV bid. Follow renal function closely - Ramipril and spiro on hold. SBP 90-110's. - BB previously DC'd due to fatigue - Echo ordered this admission, not yet done - BNP 264, no comparison  4. CAD s/p CABG and redo CABG with AAA repair - On coumadin with stable CAD so no ASA per Dr. Claris Gladden note - On atorvastatin and zetia. - No s/s ischemia  5. AVR - bioprosthetic  - Stable on echo 02/03/2017 - Repeat echo ordered  6. Carotid Stenosis - Moderate RICA stenosis.  - No stroke-like symptoms - Will repeat carotid dopplers this admission.  7. Mechanical Falls - Head CT negative for acute abnormality - No dizziness or lightheadedness prior to falls - Ordered PT  Medication concerns reviewed with patient and pharmacy team. Barriers identified: No. He is very functional and manages his own medications. He does not have trouble getting any meds. He drives himself to appointments.  Length of Stay: [place], NP  12/20/2017, 10:33 AM  Advanced Heart Failure Team Pager [phone] (M-F; 7a - 4p)  Please contact [facility] for night-coverage after hours (4p -7a ) and weekends on [email or web address]  Patient seen with NP, agree with the  above note. He was admitted with respiratory distress, chest XR not impressively abnormal.  Suspect acute bronchitis, respiratory culture with metapneumovirus.  He was in atrial fibrillation with RVR at admission, suspect this has worsened symptoms (tolerated poorly in the past).  On exam, he is volume overloaded with elevated JVP 10-12 cm.  Crackles at bases on lung exam, irregular rhythm.  Rate is controlled on IV diltiazem gtt.   1. Acute hypoxemic respiratory failure: Improved now, off oxygen.  Suspect acute bronchitis from metapneumovirus with triggering of atrial fibrillation/RVR and acute on chronic diastolic CHF.  - Treated with oseltamivir and abx as outpatient.  2. Acute on chronic diastolic CHF: Last echo in 2/18 with EF 45-50%, ischemic cardiomyopathy.  Takes Lasix 40 alt 20 at home.  On exam, he is volume overloaded.  - Will get echo.  - Lasix 40 mg  IV bid and follow response.   - He tolerated Toprol XL poorly and this was stopped in the past.  He has been on ramipril 7.5 daily and spironolactone 25 daily at home.  SBP soft at times, both meds held.  Will restart spironolactone 12.5 daily and follow => probably will need to get off diltiazem gtt after cardioversion to allow BP room for ramipril.  3. Atrial fibrillation: Persistent, with RVR.  Suspect this was triggered by viral syndrome.  The last time he was cardioverted was in 2015.  Suspect afib/RVR has triggered CHF exacerbation.  - Will arrange for DCCV, will need to be Wednesday due to lack of availability tomorrow.  INR has been therapeutic since November and is therapeutic here so no TEE required. Discussed risks/benefits of procedure with patient and he agrees.  - Continue diltiazem gtt for now, not ideal with EF 45-50% but had significant side effects with amiodarone.  - If he does not convert to NSR or goes back to afib quickly, would consider Tikosyn use (QTc ok for this).  - Continue warfarin.  4. CAD: s/p CABG.  No chest pain.   Continue statin, Zetia.  5. Carotid stenosis: Moderate on right, due for repeat.  Will order dopplers.   [name] 12/20/2017 3:10 PM

## 2017-12-20 NOTE — Telephone Encounter (Signed)
Noted  

## 2017-12-20 NOTE — Progress Notes (Signed)
[place] for warfarin Indication: atrial fibrillation  No Known Allergies  Patient Measurements: Height: 5' 10.5" (179.1 cm) Weight: 185 lb 3 oz (84 kg) IBW/kg (Calculated) : 74.15  Vital Signs: Temp Source: Oral (01/07 0744) BP: 99/66 (01/07 0900) Pulse Rate: 122 (01/07 0900)  Labs: Recent Labs    12/19/17 1219 12/19/17 1238 12/19/17 1541 12/19/17 2142 12/20/17 0304  HGB 12.3*  --   --   --  11.7*  HCT 39.0  --   --   --  37.1*  PLT 229  --   --   --  215  LABPROT  --  47.2*  --   --  43.0*  INR  --  5.16*  --   --  4.58*  CREATININE 0.92  --   --   --  0.86  TROPONINI  --   --  <0.03 <0.03 <0.03    Estimated Creatinine Clearance: 68.3 mL/min (by C-G formula based on SCr of 0.86 mg/dL).   Medical History: Past Medical History:  Diagnosis Date  . Atrial fibrillation ([place])   . Atrial fibrillation ([place])   . CAD (coronary artery disease)   . Cataract   . CHF (congestive heart failure) ([place])   . Colon polyps    adenomatous  . Diabetes mellitus   . Diverticulosis   . GERD (gastroesophageal reflux disease)   . Gout   . Hernia, abdominal   . Hx: UTI (urinary tract infection)   . Hyperlipemia   . Hypertension   . Internal hemorrhoids   . Peripheral vascular disease ([place])   . Shortness of breath     Assessment: 84 yo male on warfarin PTA for hx of afib admitted 1/6 with concern for URI and afib with RVR. Per anticoagulation clinic note on 12/27, warfarin regimen is 6 mg daily except 9 mg on M,W,F.   INR on admission 5.16 - decreased to 4.58 today. CBC remains stable. No signs/symptoms of bleeding. Currently on steroids, which can impact warfarin sensitivity.   Goal of Therapy:  INR 2-3 Monitor platelets by anticoagulation protocol: Yes   Plan:  Hold warfarin this evening Daily INR, CBC  Doylene Canard, PharmD Clinical Pharmacist  Pager: [phone] Clinical Phone for 12/20/2017 until 3:30pm: [phone] If after  3:30pm, please call main pharmacy at [phone] 12/20/2017,10:48 AM

## 2017-12-20 NOTE — Progress Notes (Signed)
PROGRESS NOTE    Nathaniel [name]  FVC:[id number] DOB: [birth year] DOA: 12/19/2017 PCP: Lorne Skeens, MD   Brief Narrative: Nathaniel [name] is a 84 y.o. male with medical history significant for A. fib, CAD status post CABG, CHF, diabetes, GERD, aVR on Coumadin who presented to the Emergency Department chief complaint persistent shortness of breath and cough. Initial evaluation reveals atrial fibrillation with rapid ventricular response. Right hospitalists are asked to admit  He states he is had nasal congestion and chest congestion for the last week or so. He has completed a five-day course of antibiotics. He got no better and in fact became worse yesterday. Associated symptoms include productive cough or wheezing intermittent subjective fever. He states he was also being treated with Tamiflu. He denies chest pain palpitations lower extremity edema. He does endorse some orthopnea but that is not new for him. He does admit to "falling out of my chair" last week but describes a mechanical fall. Reports he's been eating and eating his normal amount and denies any unintentional weight loss. Was admitted for Atrial Fibrillation with RVR and Viral Bronchitis, and also found to be in Exacerbation of CHF.   Assessment & Plan:   Principal Problem:   Atrial fibrillation with rapid ventricular response (HCC) Active Problems:   CAD (coronary artery disease) of artery bypass graft   Abdominal aortic aneurysm (HCC)   History of aortic valve replacement with bioprosthetic valve   Essential hypertension, malignant   Chronic diastolic CHF (congestive heart failure) (HCC)   Chronic systolic CHF (congestive heart failure) (HCC)   Cough   Acute bronchitis  Atrial Fibrillation with RVR -Likely related to upper respiratory infection/virus.  -Patient has Hx of Cardioversion in 2015 -Has been in sinus rhythm of late per chart review.  -He was on Metoprolol XL in the past but is not now as he could not tolerate  side effects of Fatigue.  -No longer on Amiodarone because of CNS Toxicity  -Initial troponin negative. EKG as noted above.  -Chest x-ray without cardiopulmonary process.  -He was provided with Cardizem loading dose and Cardizem drip in the emergency department.  -CHADS2VASc of at least 4 and is on Anticoagulation with Coumadin but currently held because of Supratherapeutic INR of 5.16 on Admission; Now at 4.58 -Continue Cardizem drip per protocol -Hold Coumadin currently given INR Supratherapeutic  -Troponin x 3 were <0.03  -Obtained a TSH was 0.633 -Cardiology Dr. Aundra Dubin consulted and appreciate evaluation  -Will obtain ECHOCardiogram -Plan is for patient to undergo DCCV on 12/22/17 because of lack of availability 12/21/17 and if unsuccessful will likely have Tikosyn use -Per Cardiology does not need TEE  Cough/Acute Bronchitis virus 2/2 to Acute Metapneumovirus  -Patient treated outpatient with 5 days antibiotic and Tamiflu.  -Symptoms persisted. No leukocytosis afebrile hemodynamically stable and not hypoxic.  -On Admission he had frequent productive cough during exam. Diffuse rhonchi wheeze to auscultation.  -He was provided with nebulizer treatment and reports breathing improved since then -C/w Scheduled Nebulizer's with DuoNeb 3 mL q6h -C/w Solumedrol 40 mg q12h -Levofloxacin given on Admission but now stopped and will observe as it is viral illness -Respiratory Virus Panel Positive;On Droplet Precautions -Continue Monitor oxygen saturation level -Oxygen supplementation as indicated  Acute on Chronic Combined Systolic Diastolic Heart Failure.  -BNP was 264  -Echo done in February 2018 reveals an EF of 45% moderate LVH grade 1 diastolic dysfunction.  -Cardiology consulted and appreciate Recommendations -Strict I's/O's, Daily Weights, SLIV -Check ECHOCardiogram -Home meds  include ACE Inhibitor, and Spironolactone. -Holding above medications for now because of BP being softer    -Started IV Lasix 40 mg BID -Patient is +352.3; Weight is Down 3 lbs since Admission -Will need to get off of Cardizem gtt and restart Ramipril per Cardiology -Cardiology starting Spironolactone 12.5 mg daily -Continue to Monitor Volume Status   CAD status post CABG and redo CABG/AAA status post repair/carotid stenosis status post aVR.  -No chest pain denies palpitations initial troponin negative EKG as noted above.  -Chart review indicates patient in sinus rhythm since reinversion in 2015.  -He was a one-time on Amiodarone but is no longer secondary to side effects. At one time he was on Toprol-XL but this was discontinued due to fatigue.  -Home medications include Lipitor, Zetia, Lasix, Ramapril and spironolactone.will hold ramapril and lasix and spironolactone for now -Resistive meds as indicated -Cycled troponin -Monitor on telemetry -ECHOCardiogram ordered and Cardotid Duplex ordered by Cardiology  Fall  -Patient suffered a mechanical fall last week. He reports he slid out of his chair as a cushion in the seat was not centered, patient with bruise and swelling on forehead as well as scab formation.  -He is on Coumadin with a supratherapeutic INR. Neuro exam benign -Obtain a CT of the head and showed Atrophy with chronic appearing small vessel ischemic disease. No acute intracranial abnormality. Mild left supraorbital soft swelling. -Holding Coumadin for now as Supratherapeutic  -Coumadin per pharmacy  Hypertension  -Fair control in the emergency department. Patient is on a Cardizem drip for A. fib and RVR.  -home medications as noted above -Continue to monitor  Diabetes Mellitus -C/w Moderate Novolog SSI AC/HS -CBG's ranging from 170-229 -Check HbA1c; Last HbA1c in our system was 6.5  Hyperlipidemia -C/w Atorvastatin 10 mg po Daily and Ezetimibe 10 mg po Daily  Hx of Gout -C/w Allopurinol 300 mg po Daily  BPH -C/w Tamsulosin 0.4 mg po Daily and Finasteride 5 mg po  Daily   DVT prophylaxis: Anticoagulated with Coumadin but held currently due to Supratherapeutic INR Code Status: FULL CODE Family Communication: No family present at bedside Disposition Plan: [place] for DCCV on 12/22/17  Consultants:   Cardiology Dr. Aundra Dubin   Procedures:  ECHOCARDIOGRAM ordered and pending  CAROTID DUPLEX ordered and pending   Antimicrobials:  Anti-infectives (From admission, onward)   None     Subjective: Seen and examined and stated he was breathing much better. No Nausea or Vomiting.   Objective: Vitals:   12/20/17 0300 12/20/17 0400 12/20/17 0700 12/20/17 0744  BP: 115/81 115/71 114/83 (!) 131/108  Pulse: 85 85 (!) 54 (!) 103  Resp: (!) 24 (!) 22 (!) 34 (!) 25  Temp:      TempSrc:    Oral  SpO2: 98% 93% 97% 93%  Weight:  84 kg (185 lb 3 oz)    Height:        Intake/Output Summary (Last 24 hours) at 12/20/2017 0806 Last data filed at 12/20/2017 0300 Gross per 24 hour  Intake 352.33 ml  Output -  Net 352.33 ml   Filed Weights   12/19/17 1203 12/20/17 0400  Weight: 85.3 kg (188 lb) 84 kg (185 lb 3 oz)   Examination: Physical Exam:  Constitutional: WN/WD Caucasian Male in NAD and appears calm  Eyes: Lids and conjunctivae normal, sclerae anicteric  ENMT: External Ears, Nose appear normal. Grossly normal hearing. Mucous membranes are moist.   Neck: Appears normal, supple, no cervical masses, normal ROM, no appreciable  thyromegaly; had some JVD Respiratory: Diminished to auscultation bilaterally, no wheezing, rales, rhonchi or crackles. Normal respiratory effort and patient is not tachypenic.  Cardiovascular: Irregularly Irregular and Tachycardic, no murmurs / rubs / gallops. S1 and S2 auscultated. Mild extremity edema.  Abdomen: Soft, non-tender, non-distended. No masses palpated. No appreciable hepatosplenomegaly. Bowel sounds positive x4.  GU: Deferred. Musculoskeletal: No clubbing / cyanosis of digits/nails. No joint deformity upper  and lower extremities.  Skin: No rashes, lesions, ulcers on a limited skin eal. No induration; Warm and dry.  Neurologic: CN 2-12 grossly intact with no focal deficits. Sensation intact in all 4 Extremities. Romberg sign and cerebellar reflexes not assessed.  Psychiatric: Normal judgment and insight. Alert and oriented x 3. Normal mood and appropriate affect.   Data Reviewed: I have personally reviewed following labs and imaging studies  CBC: Recent Labs  Lab 12/19/17 1219 12/20/17 0304  WBC 7.3 4.1  HGB 12.3* 11.7*  HCT 39.0 37.1*  MCV 85.3 85.7  PLT 229 092   Basic Metabolic Panel: Recent Labs  Lab 12/19/17 1219 12/20/17 0304  NA 133* 134*  K 4.0 4.9  CL 100* 99*  CO2 24 27  GLUCOSE 140* 187*  BUN 16 16  CREATININE 0.92 0.86  CALCIUM 8.6* 8.5*   GFR: Estimated Creatinine Clearance: 68.3 mL/min (by C-G formula based on SCr of 0.86 mg/dL). Liver Function Tests: No results for input(s): AST, ALT, ALKPHOS, BILITOT, PROT, ALBUMIN in the last 168 hours. No results for input(s): LIPASE, AMYLASE in the last 168 hours. No results for input(s): AMMONIA in the last 168 hours. Coagulation Profile: Recent Labs  Lab 12/19/17 1238 12/20/17 0304  INR 5.16* 4.58*   Cardiac Enzymes: Recent Labs  Lab 12/19/17 1541 12/19/17 2142 12/20/17 0304  TROPONINI <0.03 <0.03 <0.03   BNP (last 3 results) No results for input(s): PROBNP in the last 8760 hours. HbA1C: No results for input(s): HGBA1C in the last 72 hours. CBG: Recent Labs  Lab 12/19/17 2016 12/20/17 0618  GLUCAP 308* 192*   Lipid Profile: No results for input(s): CHOL, HDL, LDLCALC, TRIG, CHOLHDL, LDLDIRECT in the last 72 hours. Thyroid Function Tests: Recent Labs    12/19/17 1600  TSH 0.633   Anemia Panel: No results for input(s): VITAMINB12, FOLATE, FERRITIN, TIBC, IRON, RETICCTPCT in the last 72 hours. Sepsis Labs: Recent Labs  Lab 12/19/17 1414  LATICACIDVEN 1.77    No results found for this or  any previous visit (from the past 240 hour(s)).   Radiology Studies: Dg Chest 2 View  Result Date: 12/19/2017 CLINICAL DATA:  Cough, shortness of breath EXAM: CHEST  2 VIEW COMPARISON:  CT chest dated 12/23/2016 FINDINGS: Chronic interstitial markings with mild subpleural reticulation/ fibrosis of the lung bases. No focal consolidation. No pleural effusion or pneumothorax. Mild eventration of the right posterior hemidiaphragm. Mild cardiomegaly. Postsurgical changes related to prior CABG. Prosthetic valve. Mild degenerative changes of the visualized thoracolumbar spine. Median sternotomy. IMPRESSION: No evidence of acute cardiopulmonary disease. Electronically Signed   By: Julian Hy M.D.   On: 12/19/2017 12:43   Ct Head Wo Contrast  Result Date: 12/19/2017 CLINICAL DATA:  Patient fell on Thursday hitting head. Abrasion to left forehead. EXAM: CT HEAD WITHOUT CONTRAST TECHNIQUE: Contiguous axial images were obtained from the base of the skull through the vertex without intravenous contrast. COMPARISON:  02/03/2008 FINDINGS: BRAIN: There is sulcal and ventricular prominence consistent with superficial and central atrophy. No intraparenchymal hemorrhage, mass effect nor midline shift. Periventricular and subcortical  white matter hypodensities consistent with chronic small vessel ischemic disease are identified. No acute large vascular territory infarcts. No abnormal extra-axial fluid collections. Basal cisterns are not effaced and midline. VASCULAR: Mild bladder calcific atherosclerosis of the carotid siphons. No hyperdense vessels or unexpected calcifications. SKULL: No skull fracture. No significant scalp soft tissue swelling. SINUSES/ORBITS: The mastoid air-cells are clear. The included paranasal sinuses are well-aerated.The included ocular globes and orbital contents are non-suspicious. Bilateral lens replacements. OTHER: Mild left supraorbital soft tissue swelling. IMPRESSION: Atrophy with chronic  appearing small vessel ischemic disease. No acute intracranial abnormality. Mild left supraorbital soft swelling. Electronically Signed   By: Ashley Royalty M.D.   On: 12/19/2017 18:11   Scheduled Meds: . allopurinol  300 mg Oral Daily  . atorvastatin  10 mg Oral QODAY  . ezetimibe  10 mg Oral Daily  . finasteride  5 mg Oral Daily  . Influenza vac split quadrivalent PF  0.5 mL Intramuscular Tomorrow-1000  . insulin aspart  0-15 Units Subcutaneous TID WC  . insulin aspart  0-5 Units Subcutaneous QHS  . ipratropium-albuterol  3 mL Nebulization Q6H  . methylPREDNISolone (SOLU-MEDROL) injection  40 mg Intravenous Q12H  . pneumococcal 23 valent vaccine  0.5 mL Intramuscular Tomorrow-1000  . tamsulosin  0.4 mg Oral Daily  . timolol  1 drop Both Eyes BID   Continuous Infusions: . diltiazem (CARDIZEM) infusion 10 mg/hr (12/20/17 0744)    LOS: 1 day   Kerney Elbe, DO [facility] Pager [phone]  If 7PM-7AM, please contact night-coverage [email or web address] Password [email or web address] 12/20/2017, 8:06 AM

## 2017-12-21 ENCOUNTER — Telehealth: Payer: Self-pay | Admitting: *Deleted

## 2017-12-21 ENCOUNTER — Inpatient Hospital Stay (HOSPITAL_COMMUNITY): Payer: Medicare Other

## 2017-12-21 ENCOUNTER — Inpatient Hospital Stay: Admission: RE | Admit: 2017-12-21 | Payer: Medicare Other | Source: Ambulatory Visit

## 2017-12-21 DIAGNOSIS — I6521 Occlusion and stenosis of right carotid artery: Secondary | ICD-10-CM

## 2017-12-21 DIAGNOSIS — I714 Abdominal aortic aneurysm, without rupture: Secondary | ICD-10-CM

## 2017-12-21 DIAGNOSIS — I340 Nonrheumatic mitral (valve) insufficiency: Secondary | ICD-10-CM

## 2017-12-21 DIAGNOSIS — I6523 Occlusion and stenosis of bilateral carotid arteries: Secondary | ICD-10-CM

## 2017-12-21 LAB — ECHOCARDIOGRAM COMPLETE
Height: 70.5 in
WEIGHTICAEL: 2960 [oz_av]

## 2017-12-21 LAB — CBC WITH DIFFERENTIAL/PLATELET
BASOS ABS: 0 10*3/uL (ref 0.0–0.1)
Basophils Relative: 0 %
EOS ABS: 0 10*3/uL (ref 0.0–0.7)
EOS PCT: 0 %
HCT: 36.7 % — ABNORMAL LOW (ref 39.0–52.0)
HEMOGLOBIN: 11.5 g/dL — AB (ref 13.0–17.0)
Lymphocytes Relative: 9 %
Lymphs Abs: 0.9 10*3/uL (ref 0.7–4.0)
MCH: 27.1 pg (ref 26.0–34.0)
MCHC: 31.3 g/dL (ref 30.0–36.0)
MCV: 86.4 fL (ref 78.0–100.0)
Monocytes Absolute: 0.3 10*3/uL (ref 0.1–1.0)
Monocytes Relative: 3 %
NEUTROS PCT: 88 %
Neutro Abs: 8.1 10*3/uL — ABNORMAL HIGH (ref 1.7–7.7)
PLATELETS: 253 10*3/uL (ref 150–400)
RBC: 4.25 MIL/uL (ref 4.22–5.81)
RDW: 15.9 % — ABNORMAL HIGH (ref 11.5–15.5)
WBC: 9.3 10*3/uL (ref 4.0–10.5)

## 2017-12-21 LAB — COMPREHENSIVE METABOLIC PANEL
ALK PHOS: 67 U/L (ref 38–126)
ALT: 22 U/L (ref 17–63)
AST: 26 U/L (ref 15–41)
Albumin: 3 g/dL — ABNORMAL LOW (ref 3.5–5.0)
Anion gap: 8 (ref 5–15)
BUN: 32 mg/dL — ABNORMAL HIGH (ref 6–20)
CALCIUM: 8.7 mg/dL — AB (ref 8.9–10.3)
CHLORIDE: 97 mmol/L — AB (ref 101–111)
CO2: 28 mmol/L (ref 22–32)
CREATININE: 1.13 mg/dL (ref 0.61–1.24)
GFR calc non Af Amer: 58 mL/min — ABNORMAL LOW (ref >60)
GLUCOSE: 219 mg/dL — AB (ref 65–99)
Potassium: 4.6 mmol/L (ref 3.5–5.1)
SODIUM: 133 mmol/L — AB (ref 135–145)
Total Bilirubin: 1.2 mg/dL (ref 0.3–1.2)
Total Protein: 6.1 g/dL — ABNORMAL LOW (ref 6.5–8.1)

## 2017-12-21 LAB — GLUCOSE, CAPILLARY
GLUCOSE-CAPILLARY: 218 mg/dL — AB (ref 65–99)
GLUCOSE-CAPILLARY: 259 mg/dL — AB (ref 65–99)
GLUCOSE-CAPILLARY: 340 mg/dL — AB (ref 65–99)
GLUCOSE-CAPILLARY: 357 mg/dL — AB (ref 65–99)
Glucose-Capillary: 262 mg/dL — ABNORMAL HIGH (ref 65–99)

## 2017-12-21 LAB — PROTIME-INR
INR: 3.34
PROTHROMBIN TIME: 33.6 s — AB (ref 11.4–15.2)

## 2017-12-21 LAB — MAGNESIUM: Magnesium: 2 mg/dL (ref 1.7–2.4)

## 2017-12-21 LAB — PHOSPHORUS: Phosphorus: 3.5 mg/dL (ref 2.5–4.6)

## 2017-12-21 MED ORDER — WARFARIN - PHARMACIST DOSING INPATIENT: Freq: Every day | Status: DC

## 2017-12-21 MED ORDER — WARFARIN SODIUM 1 MG PO TABS
1.0000 mg | ORAL_TABLET | Freq: Once | ORAL | Status: AC
  Administered 2017-12-21: 1 mg via ORAL
  Filled 2017-12-21: qty 1

## 2017-12-21 MED ORDER — METHYLPREDNISOLONE SODIUM SUCC 40 MG IJ SOLR: 40.0000 mg | INTRAMUSCULAR | Status: DC

## 2017-12-21 NOTE — Evaluation (Signed)
Physical Therapy Evaluation Patient Details Name: Nathaniel [name] MRN: [id number] DOB: Dec 14, 1932 Today's Date: 12/21/2017   History of Present Illness  Pt adm with SOB and found to have afib with RVR as well as viral illness. PMH - CABG, afib, chf, dm, htn, pvd, gout  Clinical Impression  Pt presents to PT with unsteady gait and history of falls. Likely combination of baseline balance issues and inactivity in hospital. Recommend HHPT after dc to continue to work on balance activities to help reduce fall risk. Pt agreeable. Will continue to follow acutely.     Follow Up Recommendations Home health PT    Equipment Recommendations  None recommended by PT    Recommendations for Other Services       Precautions / Restrictions Precautions Precautions: Fall Restrictions Weight Bearing Restrictions: No      Mobility  Bed Mobility Overal bed mobility: Modified Independent             General bed mobility comments: Incr time  Transfers Overall transfer level: Modified independent Equipment used: None Transfers: Sit to/from Stand Sit to Stand: Modified independent (Device/Increase time)         General transfer comment: Incr time and effort  Ambulation/Gait Ambulation/Gait assistance: Min guard;Supervision Ambulation Distance (Feet): 240 Feet Assistive device: None;Rolling walker (2 wheeled) Gait Pattern/deviations: Step-through pattern;Decreased step length - right;Decreased step length - left;Wide base of support Gait velocity: decr Gait velocity interpretation: Below normal speed for age/gender General Gait Details: Unsteady gait without overt loss of balance. Initially min guard but improved to supervision with incr amb distance. Used rolling walker during first half of amb and no device second half. Use of walker didn't seem to change stability significantly.  Stairs            Wheelchair Mobility    Modified Rankin (Stroke Patients Only)       Balance  Overall balance assessment: Needs assistance Sitting-balance support: No upper extremity supported;Feet supported Sitting balance-Leahy Scale: Good     Standing balance support: No upper extremity supported;During functional activity Standing balance-Leahy Scale: Fair                               Pertinent Vitals/Pain Pain Assessment: No/denies pain    Home Living Family/patient expects to be discharged to:: Private residence Living Arrangements: Alone Available Help at Discharge: Family;Available PRN/intermittently(granddaughter lives nearby and checks on ) Type of Home: House Home Access: Stairs to enter Entrance Stairs-Rails: None Technical brewer of Steps: 2 Home Layout: One level Home Equipment: None      Prior Function Level of Independence: Independent         Comments: Reports some falls     Hand Dominance        Extremity/Trunk Assessment   Upper Extremity Assessment Upper Extremity Assessment: Overall WFL for tasks assessed    Lower Extremity Assessment Lower Extremity Assessment: Overall WFL for tasks assessed       Communication   Communication: No difficulties  Cognition Arousal/Alertness: Awake/alert Behavior During Therapy: WFL for tasks assessed/performed Overall Cognitive Status: Within Functional Limits for tasks assessed                                        General Comments General comments (skin integrity, edema, etc.): HR to 110's with amb. Dyspnea 1/4  Exercises     Assessment/Plan    PT Assessment Patient needs continued PT services  PT Problem List Decreased balance;Decreased mobility       PT Treatment Interventions DME instruction;Gait training;Functional mobility training;Therapeutic activities;Therapeutic exercise;Balance training;Patient/family education    PT Goals (Current goals can be found in the Care Plan section)  Acute Rehab PT Goals Patient Stated Goal: return home PT  Goal Formulation: With patient Time For Goal Achievement: 12/28/17 Potential to Achieve Goals: Good    Frequency Min 3X/week   Barriers to discharge Decreased caregiver support Lives alone    Co-evaluation               AM-PAC PT "6 Clicks" Daily Activity  Outcome Measure Difficulty turning over in bed (including adjusting bedclothes, sheets and blankets)?: None Difficulty moving from lying on back to sitting on the side of the bed? : A Little Difficulty sitting down on and standing up from a chair with arms (e.g., wheelchair, bedside commode, etc,.)?: A Little Help needed moving to and from a bed to chair (including a wheelchair)?: None Help needed walking in hospital room?: A Little Help needed climbing 3-5 steps with a railing? : A Little 6 Click Score: 20    End of Session Equipment Utilized During Treatment: Gait belt Activity Tolerance: Patient tolerated treatment well Patient left: in bed;with call bell/phone within reach Nurse Communication: Mobility status PT Visit Diagnosis: Unsteadiness on feet (R26.81)    Time: 2703-5009 PT Time Calculation (min) (ACUTE ONLY): 14 min   Charges:   PT Evaluation $PT Eval Low Complexity: 1 Low     PT G CodesMarland Kitchen        [facility] [place] 12/21/2017, 3:04 PM

## 2017-12-21 NOTE — Progress Notes (Signed)
Advanced Heart Failure Rounding Note  PCP: Dr. Elyse Hsu Primary Cardiologist: Dr. Aundra Dubin  Subjective:     Still in A fib, HR 80's mostly. Dilt drip continues at 10 mg/hr. SBP 100's. Started on 40 mg IV lasix BID yesterday. No change in weight. No I&O's charted.   No CP. Mild SOB with walking to the bathroom but much improved. No dizziness or lightheadedness. Coughing less. He is anxious to go home.   Objective:   Weight Range: 185 lb (83.9 kg) Body mass index is 26.17 kg/m.   Vital Signs:   Temp:  [97.4 F (36.3 C)-97.7 F (36.5 C)] 97.4 F (36.3 C) (01/08 0414) Pulse Rate:  [74-140] 87 (01/08 0414) Resp:  [19-32] 24 (01/08 0414) BP: (99-112)/(62-87) 107/65 (01/08 0414) SpO2:  [94 %-97 %] 94 % (01/08 0414) Weight:  [185 lb (83.9 kg)] 185 lb (83.9 kg) (01/08 0414)    Weight change: Filed Weights   12/19/17 1203 12/20/17 0400 12/21/17 0414  Weight: 188 lb (85.3 kg) 185 lb 3 oz (84 kg) 185 lb (83.9 kg)    Intake/Output:  No intake or output data in the 24 hours ending 12/21/17 0755    Physical Exam   General: Elderly. No resp difficulty. HEENT: Normal Neck: Supple. JVP 12. Carotids 2+ bilat; no bruits. No thyromegaly or nodule noted. Cor: PMI nondisplaced. No M/G/R noted. Irregular Lungs: Diminished throughout. Wheezing. Abdomen: Soft, non-tender, + distension, no HSM. No bruits or masses. +BS  Extremities: No cyanosis, clubbing, or rash. R and LLE no edema.  Neuro: Alert & orientedx3, cranial nerves grossly intact. moves all 4 extremities w/o difficulty. Affect pleasant   Telemetry   A fib 60-80's with rare PVC's. Personally reviewed.  EKG    No new tracings.  Labs    CBC Recent Labs    12/20/17 0304 12/21/17 0400  WBC 4.1 9.3  NEUTROABS  --  8.1*  HGB 11.7* 11.5*  HCT 37.1* 36.7*  MCV 85.7 86.4  PLT 215 102   Basic Metabolic Panel Recent Labs    12/20/17 0304 12/21/17 0400  NA 134* 133*  K 4.9 4.6  CL 99* 97*  CO2 27 28    GLUCOSE 187* 219*  BUN 16 32*  CREATININE 0.86 1.13  CALCIUM 8.5* 8.7*  MG  --  2.0  PHOS  --  3.5   Liver Function Tests Recent Labs    12/21/17 0400  AST 26  ALT 22  ALKPHOS 67  BILITOT 1.2  PROT 6.1*  ALBUMIN 3.0*   No results for input(s): LIPASE, AMYLASE in the last 72 hours. Cardiac Enzymes Recent Labs    12/19/17 1541 12/19/17 2142 12/20/17 0304  TROPONINI <0.03 <0.03 <0.03    BNP: BNP (last 3 results) Recent Labs    12/19/17 1219  BNP 264.6*    ProBNP (last 3 results) No results for input(s): PROBNP in the last 8760 hours.   D-Dimer No results for input(s): DDIMER in the last 72 hours. Hemoglobin A1C No results for input(s): HGBA1C in the last 72 hours. Fasting Lipid Panel No results for input(s): CHOL, HDL, LDLCALC, TRIG, CHOLHDL, LDLDIRECT in the last 72 hours. Thyroid Function Tests Recent Labs    12/19/17 1600  TSH 0.633    Other results:   Imaging     No results found.   Medications:     Scheduled Medications: . allopurinol  300 mg Oral Daily  . atorvastatin  10 mg Oral QODAY  . ezetimibe  10  mg Oral Daily  . finasteride  5 mg Oral Daily  . furosemide  40 mg Intravenous BID  . Influenza vac split quadrivalent PF  0.5 mL Intramuscular Tomorrow-1000  . insulin aspart  0-15 Units Subcutaneous TID WC  . insulin aspart  0-5 Units Subcutaneous QHS  . methylPREDNISolone (SOLU-MEDROL) injection  40 mg Intravenous Q12H  . pneumococcal 23 valent vaccine  0.5 mL Intramuscular Tomorrow-1000  . sodium chloride flush  3 mL Intravenous Q12H  . spironolactone  12.5 mg Oral Daily  . tamsulosin  0.4 mg Oral Daily  . timolol  1 drop Both Eyes BID     Infusions: . sodium chloride    . diltiazem (CARDIZEM) infusion 10 mg/hr (12/21/17 0641)     PRN Medications:  acetaminophen, ondansetron (ZOFRAN) IV, sodium chloride flush    Patient Profile   Nathaniel [name] is a 84 y.o. male with a medial history of A fib on coumadin (NSR since  DCCV 08/2014), CAD s/p CABG (1989) and redo CABG (2007), AAA s/p repair (2015), CHF, DM, PAD, carotid stenosis, and bioprothestic AVR (2007).  He was admitted on 1/6 with worsening SOB and found to be in A fib RVR.  Assessment/Plan   1. A fib with RVR - Continue Cardizem drip @ 10 mg/hr - Rate now controlled on cardizem - Had hallucinations with amio and fatigue with beta blockers in the past. - TSH normal - On coumadin. INR 3.34 this am. Dosing per pharmacy. - INR has been therapeutic since 11/11/17. Plan for DCCV 1/9 @ 12. - If unsuccessful or recurrence of a fib after DCCV, will likely start tikosyn. - Likely that a fib was triggered by respiratory viral infection.   2. Cough/Respiratory infection/Bronchitis - RSV + for metapneumovirus. Continue droplet precautions - Being treated with solumedrol - Has completed coarse of antibiotics and tamiflu outpatient - Cough improved  3. Acute on Chronic Systolic and Diastolic Heart Failure, Suspected ICM, EF 45-50% Echo 12/2014 EF 55-60% with grade II DD; Echo 01/2017 EF 45-50% with grade I DD LHC 06/2014: LIMA-LAD patent with 40-50% stenosis in LAD after touchdown, sequential SVG-ramus/OM/diagonal with only the ramus branch still intact (known from prior cath), SVG-PDA from original surgery TO, SVG-PDA from redo surgery with long 50-60% mid-graft stenosis. No target for intervention.  - NYHA class III, Volume status mildly elevated - Takes 40/20 alternating Lasix at home. Continue 40 mg IV lasix BID. Creatinine 0.86 > 1.13. Watch renal function closely. If creatinine increases tomorrow, will hold diuretics.  - Continue spiro 12.5 mg daily. - Ramipril on hold. SBP's 100s with dilt drip. - BB previously DC'd due to fatigue - Echo done this am. Not yet read. - BNP 264 on admission, no comparison - Encouraged him to allow output to be recorded. He is agreeable.   4. CAD s/p CABG and redo CABG with AAA repair - On coumadin with stable CAD  so no ASA per Dr. Claris Gladden note - On atorvastatin and zetia. - No s/s ischemia  5. AVR - bioprosthetic  - Stable on echo 02/03/2017 - Repeat echo done this am.  6. Carotid Stenosis - Hx of moderate RICA stenosis.  - No stroke-like symptoms - Carotid dopplers ordered  7. Mechanical Falls - Head CT negative for acute abnormality - No dizziness or lightheadedness prior to falls - PT ordered, not yet seen.  Medication concerns reviewed with patient and pharmacy team. Barriers identified: No. He is very functional and manages his own medications. He does  not have trouble getting any meds. He drives himself to appointments.  Length of Stay: [place], NP  12/21/2017, 7:55 AM  Advanced Heart Failure Team Pager [phone] (M-F; 7a - 4p)  Please contact [facility] for night-coverage after hours (4p -7a ) and weekends on [email or web address]  Patient seen with NP, agree with the above note.  He seems to be breathing better today. I/Os not recorded but he says that he urinated a lot.  Still some volume overload on exam. He remains in rate-controlled atrial fibrillation on diltiazem gtt.   Echo: EF 45-50%, stable bioprosthetic aortic valve (no change).   Carotid dopplers: 70-99% RICA stenosis.   1. Acute hypoxemic respiratory failure: Improved now, off oxygen.  Suspect acute bronchitis from metapneumovirus with triggering of atrial fibrillation/RVR and acute on chronic diastolic CHF.  - Treated with oseltamivir and abx as outpatient.  2. Acute on chronic diastolic CHF: Echo this admission stable with EF 45-50%, ischemic cardiomyopathy. Takes Lasix 40 alt 20 at home.  On exam, he remains volume overloaded.  - Need strict I/Os  - Continue Lasix 40 mg IV bid today.  Creatinine is up a bit, watch closely.  - He tolerated Toprol XL poorly and this was stopped in the past.  He has been on ramipril 7.5 daily and spironolactone 25 daily at home.  Continue current spironolactone here.  BP soft on  diltiazem gtt, will restart ramipril after DCCV and off diltiazem.  3. Atrial fibrillation: Persistent, with RVR.  Suspect this was triggered by viral syndrome.  The last time he was cardioverted was in 2015.  Suspect afib/RVR has triggered CHF exacerbation.  - Will arrange for DCCV, will need to be Wednesday due to lack of availability today.  INR has been therapeutic since November and is therapeutic here so no TEE required. Discussed risks/benefits of procedure with patient and he agrees.  - Continue diltiazem gtt for now, not ideal with EF 45-50% but had significant side effects with amiodarone.  - If he does not convert to NSR or goes back to afib quickly, would consider Tikosyn use (QTc ok for this).  - Continue warfarin.  4. CAD: s/p CABG.  No chest pain.  Continue statin, Zetia.  5. Carotid stenosis: Severe RICA stenosis on right.  He is asymptomatic.  [facility] has seen.  Will need right CEA. After DCCV, will need to be anticoagulated x 1 month without interrupting.  At that point, could hold warfarin for CEA.   [name] 12/21/2017 1:12 PM

## 2017-12-21 NOTE — Telephone Encounter (Signed)
-----   Message from Mena Goes, RN sent at 12/21/2017  2:22 PM EST ----- Regarding: FW: charge   ----- Message ----- From: Angelia Mould, MD Sent: 12/21/2017   1:58 PM To: [facility] Charge Pool Subject: charge                                         Level 4 consult.  He has a greater than 80% right carotid stenosis which is asymptomatic. This is a patient of Dr. Stephens Shire.  He is scheduled for cardioversion tomorrow and then will need to be on uninterrupted Coumadin for 1 month.  After 1 month, he can hold his Coumadin for right carotid endarterectomy.  He needs an appointment with Dr. Trula Slade in 3 weeks to discuss right carotid endarterectomy. CD

## 2017-12-21 NOTE — Progress Notes (Signed)
PROGRESS NOTE    Nathaniel [name]  ZOX:[id number] DOB: 18-Feb-1932 DOA: 12/19/2017 PCP: Lorne Skeens, MD   Brief Narrative: Nathaniel [name] is a 84 y.o. male with medical history significant for A. fib, CAD status post CABG, CHF, diabetes, GERD, aVR on Coumadin who presented to the Emergency Department chief complaint persistent shortness of breath and cough. Initial evaluation reveals atrial fibrillation with rapid ventricular response. Right hospitalists are asked to admit  He states he is had nasal congestion and chest congestion for the last week or so. He has completed a five-day course of antibiotics. He got no better and in fact became worse yesterday. Associated symptoms include productive cough or wheezing intermittent subjective fever. He states he was also being treated with Tamiflu. He denies chest pain palpitations lower extremity edema. He does endorse some orthopnea but that is not new for him. He does admit to "falling out of my chair" last week but describes a mechanical fall. Reports he's been eating and eating his normal amount and denies any unintentional weight loss. Was admitted for Atrial Fibrillation with RVR and Viral Bronchitis, and also found to be in Exacerbation of CHF. Patient to under go DCCV Cardioversion in AM.   Assessment & Plan:   Principal Problem:   Atrial fibrillation with rapid ventricular response (HCC) Active Problems:   CAD (coronary artery disease) of artery bypass graft   Abdominal aortic aneurysm (HCC)   History of aortic valve replacement with bioprosthetic valve   Essential hypertension, malignant   Chronic diastolic CHF (congestive heart failure) (HCC)   Chronic systolic CHF (congestive heart failure) (HCC)   Cough   Acute bronchitis   Acute on chronic combined systolic and diastolic CHF (congestive heart failure) (HCC)  Atrial Fibrillation with RVR, improved  -Likely related to upper respiratory infection/virus.  -Patient has Hx of  Cardioversion in 2015 -Has been in sinus rhythm of late per chart review.  -He was on Metoprolol XL in the past but is not now as he could not tolerate side effects of Fatigue.  -No longer on Amiodarone because of CNS Toxicity  -Initial troponin negative. EKG as noted above.  -Chest x-ray without cardiopulmonary process.  -He was provided with Cardizem loading dose and Cardizem drip in the emergency department.  -CHADS2VASc of at least 4 and is on Anticoagulation with Coumadin but currently held because of Supratherapeutic INR of 5.16 on Admission; Now at 4.58 -Continue Cardizem drip per protocol -Hold Coumadin currently given INR Supratherapeutic  -Troponin x 3 were <0.03  -Obtained a TSH was 0.633 -Cardiology Dr. Aundra Dubin consulted and appreciate evaluation  -ECHOCardiogram as below  -Plan is for patient to undergo DCCV on 12/22/17 because of lack of availability 12/21/17 and if unsuccessful will likely have Tikosyn use -Per Cardiology does not need TEE  Cough/Acute Bronchitis virus 2/2 to Acute Metapneumovirus  -Patient treated outpatient with 5 days antibiotic and Tamiflu.  -Symptoms persisted. No leukocytosis afebrile hemodynamically stable and not hypoxic.  -On Admission he had frequent productive cough during exam. Diffuse rhonchi wheeze to auscultation.  -He was provided with nebulizer treatment and reports breathing improved since then -C/w Scheduled Nebulizer's with DuoNeb 3 mL q6h -C/w Solumedrol 40 mg q12h and go to Daily in AM  -Levofloxacin given on Admission but now stopped and will observe as it is viral illness -Respiratory Virus Panel Positive;On Droplet Precautions -Continue Monitor oxygen saturation level -Oxygen supplementation as indicated  Acute on Chronic Combined Systolic Diastolic Heart Failure.  -BNP was 264  -  Echo done in February 2018 reveals an EF of 45% moderate LVH grade 1 diastolic dysfunction.  -Cardiology consulted and appreciate Recommendations -Strict  I's/O's, Daily Weights, SLIV -Check ECHOCardiogram -Home meds include ACE Inhibitor, and Spironolactone. -Holding above medications for now because of BP being softer  -Started IV Lasix 40 mg BID and will continue today per Cardiology  -Patient is +528.7; Weight is Down 3 lbs since Admission -Will need to get off of Cardizem gtt and restart Ramipril per Cardiology -Cardiology starting Spironolactone 12.5 mg daily -Continue to Monitor Volume Status  -DCCV in AM   CAD status post CABG and redo CABG/AAA status post repair/carotid stenosis status post aVR.  -No chest pain denies palpitations initial troponin negative EKG as noted above.  -Chart review indicates patient in sinus rhythm since reinversion in 2015.  -He was a one-time on Amiodarone but is no longer secondary to side effects. At one time he was on Toprol-XL but this was discontinued due to fatigue.  -Home medications include Lipitor, Zetia, Lasix, Ramapril and spironolactone.will hold ramapril and lasix and spironolactone for now but Cardiology resuming  -Cycled troponin and Negative  -Monitor on telemetry -ECHOCardiogram ordered and Cardotid Duplex ordered by Cardiology as below   Fall  -Patient suffered a mechanical fall last week. He reports he slid out of his chair as a cushion in the seat was not centered, patient with bruise and swelling on forehead as well as scab formation.  -He is on Coumadin with a supratherapeutic INR. Neuro exam benign -Obtain a CT of the head and showed Atrophy with chronic appearing small vessel ischemic disease. No acute intracranial abnormality. Mild left supraorbital soft swelling. -Holding Coumadin for now as Supratherapeutic and now 3.34 -Coumadin per pharmacy -PT/OT recommending Home Health   Hypertension  -Fair control in the emergency department. Patient is on a Cardizem drip for A. fib and RVR.  -home medications as noted above -Continue to monitor  Diabetes Mellitus -C/w Moderate  Novolog SSI AC/HS -CBG's ranging from 218-340 -Check HbA1c; Last HbA1c in our system was 6.5 -Will add 9 units of Lantus in Hospital while on steroids per Diabetic Education Coordinator Recc's   Hyperlipidemia -C/w Atorvastatin 10 mg po Daily and Ezetimibe 10 mg po Daily  Hx of Gout -C/w Allopurinol 300 mg po Daily  BPH -C/w Tamsulosin 0.4 mg po Daily and Finasteride 5 mg po Daily  Right Carotid Stenosis -Carotid Duplex Done showed Right Carotid: There is evidence in the right ICA of a 80-99% stenosis. Stenosis appears more severe than prior study 12/2016. Difficult exam due to tissue attenuation and poor windows.    Left Carotid: There is evidence in the left ICA of a 1-39% stenosis. Difficult exam due to tissue attenuation and poor windows.    -Vascular Dr. Scot Dock consulted for further evaluation and recommendation; -Per Dr. Scot Dock will need to be off of coumadin for CEA as an outpatient but per Dr. Aundra Dubin needs to be Anticoagulated x1 month without interrupting but after that could hold Warfarin for CEA   DVT prophylaxis: Anticoagulated with Coumadin but held currently due to Supratherapeutic INR Code Status: FULL CODE Family Communication: Discussed with son at bedside Disposition Plan: [facility] for DCCV on 12/22/17  Consultants:   Cardiology Dr. Aundra Dubin   Procedures:  ECHOCARDIOGRAM  ------------------------------------------------------------------- Study Conclusions  - Left ventricle: The cavity size was mildly dilated. Wall   thickness was normal. Systolic function was mildly reduced. The   estimated ejection fraction was in the range of  45% to 50%. The   study is not technically sufficient to allow evaluation of LV   diastolic function. - Aortic valve: Normal functioning bio-prosthetic valve with no   significant AR Valve area (Vmax): 2.01 cm^2. - Mitral valve: Severely calcified annulus. There was mild   regurgitation. - Left atrium: The atrium was  severely dilated. - Atrial septum: No defect or patent foramen ovale was identified.   CAROTID DUPLEX  Right Carotid: There is evidence in the right ICA of a 80-99% stenosis. Stenosis appears more severe than prior study 12/2016. Difficult exam due to tissue attenuation and poor windows.    Left Carotid: There is evidence in the left ICA of a 1-39% stenosis. Difficult exam due to tissue attenuation and poor windows.        Antimicrobials:  Anti-infectives (From admission, onward)   None     Subjective: Seen and examined and felt better. No CP and states breathing is better.   Objective: Vitals:   12/20/17 1200 12/20/17 1621 12/20/17 1927 12/21/17 0414  BP: 112/62 105/71 102/87 107/65  Pulse: (!) 127 (!) 140 92 87  Resp: (!) 26 (!) 32 19 (!) 24  Temp:  97.7 F (36.5 C) 97.6 F (36.4 C) (!) 97.4 F (36.3 C)  TempSrc: Oral Oral Oral Oral  SpO2: 96% 95% 95% 94%  Weight:    83.9 kg (185 lb)  Height:       No intake or output data in the 24 hours ending 12/21/17 0823 Filed Weights   12/19/17 1203 12/20/17 0400 12/21/17 0414  Weight: 85.3 kg (188 lb) 84 kg (185 lb 3 oz) 83.9 kg (185 lb)   Examination: Physical Exam:  Constitutional: WN/WD Caucasian male in NAD appears calm Eyes:  Sclerae anicteric; Lids normal ENMT: External Ears and nose appear normal Neck: Supple with no JVD Respiratory: Diminished but unlabored breathing. No appreciable wheezing/rales/rhonchi  Cardiovascular: Irregularly Irregular but rate controlled. Mild edema Abdomen: Soft, NT, ND. Bowel sounds present GU: Deferred Musculoskeletal: No contractures; No cyanosis Skin: Warm and dry; No rashes or lesions on a limited skin eval Neurologic: CN 2-12 grossly intact. No appreciable focal deficits.  Psychiatric: Normal mood and affect. Intact judgement and insight  Data Reviewed: I have personally reviewed following labs and imaging studies  CBC: Recent Labs  Lab 12/19/17 1219 12/20/17 0304  12/21/17 0400  WBC 7.3 4.1 9.3  NEUTROABS  --   --  8.1*  HGB 12.3* 11.7* 11.5*  HCT 39.0 37.1* 36.7*  MCV 85.3 85.7 86.4  PLT 229 215 144   Basic Metabolic Panel: Recent Labs  Lab 12/19/17 1219 12/20/17 0304 12/21/17 0400  NA 133* 134* 133*  K 4.0 4.9 4.6  CL 100* 99* 97*  CO2 24 27 28   GLUCOSE 140* 187* 219*  BUN 16 16 32*  CREATININE 0.92 0.86 1.13  CALCIUM 8.6* 8.5* 8.7*  MG  --   --  2.0  PHOS  --   --  3.5   GFR: Estimated Creatinine Clearance: 52 mL/min (by C-G formula based on SCr of 1.13 mg/dL). Liver Function Tests: Recent Labs  Lab 12/21/17 0400  AST 26  ALT 22  ALKPHOS 67  BILITOT 1.2  PROT 6.1*  ALBUMIN 3.0*   No results for input(s): LIPASE, AMYLASE in the last 168 hours. No results for input(s): AMMONIA in the last 168 hours. Coagulation Profile: Recent Labs  Lab 12/19/17 1238 12/20/17 0304 12/21/17 0400  INR 5.16* 4.58* 3.34   Cardiac Enzymes: Recent  Labs  Lab 12/19/17 1541 12/19/17 2142 12/20/17 0304  TROPONINI <0.03 <0.03 <0.03   BNP (last 3 results) No results for input(s): PROBNP in the last 8760 hours. HbA1C: No results for input(s): HGBA1C in the last 72 hours. CBG: Recent Labs  Lab 12/20/17 0618 12/20/17 1112 12/20/17 1621 12/20/17 2053 12/21/17 0551  GLUCAP 192* 229* 170* 255* 218*   Lipid Profile: No results for input(s): CHOL, HDL, LDLCALC, TRIG, CHOLHDL, LDLDIRECT in the last 72 hours. Thyroid Function Tests: Recent Labs    12/19/17 1600  TSH 0.633   Anemia Panel: No results for input(s): VITAMINB12, FOLATE, FERRITIN, TIBC, IRON, RETICCTPCT in the last 72 hours. Sepsis Labs: Recent Labs  Lab 12/19/17 1249 12/19/17 1414  LATICACIDVEN 2.21* 1.77    Recent Results (from the past 240 hour(s))  Respiratory Panel by PCR     Status: Abnormal   Collection Time: 12/19/17  3:24 PM  Result Value Ref Range Status   Adenovirus NOT DETECTED NOT DETECTED Final   Coronavirus 229E NOT DETECTED NOT DETECTED Final     Coronavirus HKU1 NOT DETECTED NOT DETECTED Final   Coronavirus NL63 NOT DETECTED NOT DETECTED Final   Coronavirus OC43 NOT DETECTED NOT DETECTED Final   Metapneumovirus DETECTED (A) NOT DETECTED Final   Rhinovirus / Enterovirus NOT DETECTED NOT DETECTED Final   Influenza A NOT DETECTED NOT DETECTED Final   Influenza B NOT DETECTED NOT DETECTED Final   Parainfluenza Virus 1 NOT DETECTED NOT DETECTED Final   Parainfluenza Virus 2 NOT DETECTED NOT DETECTED Final   Parainfluenza Virus 3 NOT DETECTED NOT DETECTED Final   Parainfluenza Virus 4 NOT DETECTED NOT DETECTED Final   Respiratory Syncytial Virus NOT DETECTED NOT DETECTED Final   Bordetella pertussis NOT DETECTED NOT DETECTED Final   Chlamydophila pneumoniae NOT DETECTED NOT DETECTED Final   Mycoplasma pneumoniae NOT DETECTED NOT DETECTED Final     Radiology Studies: Dg Chest 2 View  Result Date: 12/19/2017 CLINICAL DATA:  Cough, shortness of breath EXAM: CHEST  2 VIEW COMPARISON:  CT chest dated 12/23/2016 FINDINGS: Chronic interstitial markings with mild subpleural reticulation/ fibrosis of the lung bases. No focal consolidation. No pleural effusion or pneumothorax. Mild eventration of the right posterior hemidiaphragm. Mild cardiomegaly. Postsurgical changes related to prior CABG. Prosthetic valve. Mild degenerative changes of the visualized thoracolumbar spine. Median sternotomy. IMPRESSION: No evidence of acute cardiopulmonary disease. Electronically Signed   By: Julian Hy M.D.   On: 12/19/2017 12:43   Ct Head Wo Contrast  Result Date: 12/19/2017 CLINICAL DATA:  Patient fell on Thursday hitting head. Abrasion to left forehead. EXAM: CT HEAD WITHOUT CONTRAST TECHNIQUE: Contiguous axial images were obtained from the base of the skull through the vertex without intravenous contrast. COMPARISON:  02/03/2008 FINDINGS: BRAIN: There is sulcal and ventricular prominence consistent with superficial and central atrophy. No  intraparenchymal hemorrhage, mass effect nor midline shift. Periventricular and subcortical white matter hypodensities consistent with chronic small vessel ischemic disease are identified. No acute large vascular territory infarcts. No abnormal extra-axial fluid collections. Basal cisterns are not effaced and midline. VASCULAR: Mild bladder calcific atherosclerosis of the carotid siphons. No hyperdense vessels or unexpected calcifications. SKULL: No skull fracture. No significant scalp soft tissue swelling. SINUSES/ORBITS: The mastoid air-cells are clear. The included paranasal sinuses are well-aerated.The included ocular globes and orbital contents are non-suspicious. Bilateral lens replacements. OTHER: Mild left supraorbital soft tissue swelling. IMPRESSION: Atrophy with chronic appearing small vessel ischemic disease. No acute intracranial abnormality. Mild left supraorbital soft  swelling. Electronically Signed   By: Ashley Royalty M.D.   On: 12/19/2017 18:11   Scheduled Meds: . allopurinol  300 mg Oral Daily  . atorvastatin  10 mg Oral QODAY  . ezetimibe  10 mg Oral Daily  . finasteride  5 mg Oral Daily  . furosemide  40 mg Intravenous BID  . Influenza vac split quadrivalent PF  0.5 mL Intramuscular Tomorrow-1000  . insulin aspart  0-15 Units Subcutaneous TID WC  . insulin aspart  0-5 Units Subcutaneous QHS  . methylPREDNISolone (SOLU-MEDROL) injection  40 mg Intravenous Q12H  . pneumococcal 23 valent vaccine  0.5 mL Intramuscular Tomorrow-1000  . sodium chloride flush  3 mL Intravenous Q12H  . spironolactone  12.5 mg Oral Daily  . tamsulosin  0.4 mg Oral Daily  . timolol  1 drop Both Eyes BID   Continuous Infusions: . sodium chloride    . diltiazem (CARDIZEM) infusion 10 mg/hr (12/21/17 0641)    LOS: 2 days   Kerney Elbe, DO [facility] Pager [phone]  If 7PM-7AM, please contact night-coverage [email or web address] Password [facility] 12/21/2017, 8:23 AM

## 2017-12-21 NOTE — Consult Note (Signed)
Hospital Consult  VASCULAR SURGERY ASSESSMENT & PLAN:   This patient has a greater than 80% right carotid stenosis with no significant stenosis on the left.  He is asymptomatic.  Cardiology plans cardioversion tomorrow.  As per Dr. Claris Gladden note, he will need to be on uninterrupted anticoagulation for 1 month after his cardioversion.  At that point he can hold his Coumadin for right carotid endarterectomy.  I will have him follow-up with Dr. Trula Slade who has done his endovascular stent graft and who has been following him from a vascular standpoint.    Deitra Mayo, MD, FACS Beeper [phone] Office: [phone]   Reason for Consult:  Severe carotid artery stenosis  Requesting Physician:  Dr. Marigene Ehlers MRN #:  [id number]  History of Present Illness: This is a 84 y.o. male who is known to Dr. Trula Slade for hx of EVAR on 04/13/14.  His post operative course was complicated by afib and required anticoagulation. He underwent DCCV in September 2015.  He was last seen in March and his stent graft was in good position with no evidence of endo-leak.   He is on coumadin.  He states they are planning for cardioversion tomorrow.   Most recently two days ago,  he had a productive cough with brown sputum and was short of breath with minimal activity.  He did not have fever but reports chills.  He was given an abx and tamiflu.   He continued to be short of breath and have a cough.  Upon evaluation, he was found to be in afib with RVR and he was admitted to the hospital.  He is on a diltiazem gtt.  He is now on droplet precautions with RSV + for metapneumovirus and is being treated with solumedrol.    Pt has a hx of MVCAD with hx of CABG and Redo CABG as well as AVR.  He has a hx of heart failure and is followed by Dr. Aundra Dubin. He is diabetic and on oral agents.  He is on a statin daily.  He is on an ACEI for blood pressure management.   He has a hx of carotid artery stenosis and while in the hospital, a carotid  duplex was obtained and revealed 80-99% right ICA stenosis with the left being 1-39% stenosis.  The left  vertebral are patent with antegrade flow.  The right vertebral was not visualized.  He denies any weakness or clumsiness on either side of the body, denies speech difficulty or temporary blindness.    Past Medical History:  Diagnosis Date  . Atrial fibrillation ([place])   . Atrial fibrillation ([place])   . CAD (coronary artery disease)   . Cataract   . CHF (congestive heart failure) ([place])   . Colon polyps    adenomatous  . Diabetes mellitus   . Diverticulosis   . GERD (gastroesophageal reflux disease)   . Gout   . Hernia, abdominal   . Hx: UTI (urinary tract infection)   . Hyperlipemia   . Hypertension   . Internal hemorrhoids   . Peripheral vascular disease ([place])   . Shortness of breath     Past Surgical History:  Procedure Laterality Date  . ABDOMINAL AORTIC ENDOVASCULAR STENT GRAFT N/A 04/13/2014   Procedure: ABDOMINAL AORTIC ENDOVASCULAR STENT GRAFT;  Surgeon: Serafina Mitchell, MD;  Location: [place];  Service: Vascular;  Laterality: N/A;  . ANGIOPLASTY     Unsure if he had stents put in   . APPENDECTOMY    . CARDIAC  VALVE SURGERY    . CARDIOVERSION N/A 06/06/2014   Procedure: CARDIOVERSION;  Surgeon: Larey Dresser, MD;  Location: [facility] ENDOSCOPY;  Service: Cardiovascular;  Laterality: N/A;  . CARDIOVERSION N/A 08/21/2014   Procedure: CARDIOVERSION;  Surgeon: Larey Dresser, MD;  Location: [place];  Service: Cardiovascular;  Laterality: N/A;  . CORONARY ARTERY BYPASS GRAFT    . EYE SURGERY Bilateral    cataract surgery  . HERNIA REPAIR     2 operations  . LEFT HEART CATHETERIZATION WITH CORONARY/GRAFT ANGIOGRAM N/A 07/10/2014   Procedure: LEFT HEART CATHETERIZATION WITH Beatrix Fetters;  Surgeon: Larey Dresser, MD;  Location: [facility] CATH LAB;  Service: Cardiovascular;  Laterality: N/A;  . TONSILLECTOMY    . VIDEO BRONCHOSCOPY Bilateral 12/25/2015   Procedure: VIDEO  BRONCHOSCOPY WITHOUT FLUORO;  Surgeon: Javier Glazier, MD;  Location: [place];  Service: Cardiopulmonary;  Laterality: Bilateral;    No Known Allergies  Prior to Admission medications   Medication Sig Start Date End Date Taking? Authorizing Provider  allopurinol (ZYLOPRIM) 300 MG tablet Take 300 mg by mouth daily.  05/13/17  Yes [provider]  atorvastatin (LIPITOR) 10 MG tablet Take 1 tablet (10 mg total) by mouth daily. Patient taking differently: Take 10 mg by mouth every other day.  06/25/17  Yes Larey Dresser, MD  atorvastatin (LIPITOR) 20 MG tablet TAKE ONE TABLET EVERY DAY Patient taking differently: Take 20 mg by mouth every other day 07/19/17  Yes Larey Dresser, MD  Coenzyme Q10 (CO Q10 PO) Take 1 capsule by mouth daily.   Yes [provider]  doxazosin (CARDURA) 1 MG tablet Take 1 mg by mouth in the evening   Yes [provider]  doxazosin (CARDURA) 2 MG tablet Take 2 mg by mouth every morning.   Yes [provider]  ezetimibe (ZETIA) 10 MG tablet Take 10 mg by mouth daily.    Yes [provider]  finasteride (PROSCAR) 5 MG tablet Take 5 mg by mouth daily.   Yes [provider]  furosemide (LASIX) 20 MG tablet TAKE ONE TABLET ALTERNATING WITH 40MG  INTHE MORNING Patient taking differently: Take 20 mg by mouth every other morning 02/08/17  Yes Larey Dresser, MD  furosemide (LASIX) 40 MG tablet TAKE ONE TABLET ALTERNATING WITH 20MG  INTHE MORNING Patient taking differently: Take 40 mg by mouth every other morning 06/23/17  Yes Bensimhon, Shaune Pascal, MD  glucosamine-chondroitin 500-400 MG tablet Take 1 tablet by mouth daily.   Yes [provider]  latanoprost (XALATAN) 0.005 % ophthalmic solution Instill 1 drop into the affected eye at bedtime   Yes [provider]  Multiple Vitamins-Minerals (OCUVITE PO) Take 1 tablet by mouth daily.   Yes [provider]  Omega-3 Fatty Acids (OMEGA-3 PO) Take 1  capsule by mouth daily.   Yes [provider]  ramipril (ALTACE) 2.5 MG capsule Take one cap daily with Ramipril 5mg  cap daily (total dose 7.5mg  daily) Patient taking differently: Take 2.5 mg by mouth at bedtime.  02/02/17  Yes Larey Dresser, MD  ramipril (ALTACE) 5 MG capsule Take one capsule daily with Ramipril 2.5mg  tablet daily (total dose 7.5mg  daily) Patient taking differently: Take 5 mg by mouth every morning.  02/02/17  Yes Larey Dresser, MD  sitaGLIPtin (JANUVIA) 100 MG tablet Take 100 mg by mouth every other day.   Yes [provider]  spironolactone (ALDACTONE) 25 MG tablet TAKE ONE TABLET EACH DAY Patient taking differently: Take 25  mg by mouth once a day 08/20/17  Yes Larey Dresser, MD  tamsulosin (FLOMAX) 0.4 MG CAPS capsule Take 0.4 mg by mouth daily. 08/24/17  Yes [provider]  timolol (TIMOPTIC) 0.5 % ophthalmic solution Place 1 drop into the right eye daily.  05/13/17  Yes [provider]  warfarin (COUMADIN) 3 MG tablet Take 2 tablets (6 mg) by mouth on Sun, Tues, Thurs and Sat and Take 3 tablets (9 mg) by mouth on MWF Patient taking differently: Take 6-9 mg by mouth See admin instructions. 6 mg at bedtime on Sun/Tues/Thurs/Sat and 9 mg on Mon/Wed/Fri 11/11/17  Yes Larey Dresser, MD  sildenafil (VIAGRA) 50 MG tablet Take 50 mg by mouth daily as needed for erectile dysfunction.     [provider]    Social History   Socioeconomic History  . Marital status: Widowed    Spouse name: Not on file  . Number of children: Not on file  . Years of education: Not on file  . Highest education level: Not on file  Social Needs  . Financial resource strain: Not on file  . Food insecurity - worry: Not on file  . Food insecurity - inability: Not on file  . Transportation needs - medical: Not on file  . Transportation needs - non-medical: Not on file  Occupational History  . Occupation: [employer]   Tobacco Use  . Smoking  status: Former Smoker    Packs/day: 0.25    Years: 20.00    Pack years: 5.00    Types: Cigarettes    Last attempt to quit: 03/23/1992    Years since quitting: 25.7  . Smokeless tobacco: Never Used  Substance and Sexual Activity  . Alcohol use: Yes    Alcohol/week: 6.0 oz    Types: 10 Glasses of wine per week    Comment: 1-2 drinks daily   . Drug use: No  . Sexual activity: Not on file  Other Topics Concern  . Not on file  Social History Narrative   1 caffeine drink daily      Family History  Problem Relation Age of Onset  . Heart disease Mother   . Heart disease Father        before age 55  . AAA (abdominal aortic aneurysm) Father   . Colon cancer Neg Hx     ROS: [x]  Positive   [ ]  Negative   [ ]  All sytems reviewed and are negative  Cardiac: []  chest pain/pressure []  palpitations [x]  SOB lying flat [x]  DOE [x]  irregular heartbeat  [x]  hx heart surgery and redo heart surgery  Vascular: []  pain in legs while walking []  pain in legs at rest []  pain in legs at night []  non-healing ulcers []  hx of DVT []  swelling in legs  Pulmonary: [x]  productive cough []  asthma/wheezing []  home O2  Neurologic: []  weakness in []  arms []  legs []  numbness in []  arms []  legs []  hx of CVA []  mini stroke [] difficulty speaking or slurred speech []  temporary loss of vision in one eye []  dizziness  Hematologic: []  hx of cancer []  bleeding problems []  problems with blood clotting easily  Endocrine:   [x]  diabetes []  thyroid disease  GI []  vomiting blood []  blood in stool [x]  hx colon polyms [x]  diverticulosis  [x]  GERD  GU: []  CKD/renal failure []  HD--[]  M/W/F or []  T/T/S []  burning with urination []  blood in urine  Psychiatric: []  anxiety []  depression  Musculoskeletal: []  arthritis []   joint pain [x]  gout  Integumentary: []  rashes []  ulcers  Constitutional: []  fever [x]  chills   Physical Examination  Vitals:   12/20/17 1927 12/21/17 0414  BP:  102/87 107/65  Pulse: 92 87  Resp: 19 (!) 24  Temp: 97.6 F (36.4 C) (!) 97.4 F (36.3 C)  SpO2: 95% 94%   Body mass index is 26.17 kg/m.  General:  WDWN in NAD Gait: Not observed HENT: WNL, normocephalic Pulmonary: normal non-labored breathing, without Rales, rhonchi,  wheezing Cardiac: irregular, without  Murmurs, rubs or gallops; without carotid bruits Abdomen:  soft, NT/ND, no masses Skin: without rashes Vascular Exam/Pulses:  Right Left  Radial 2+ (normal) 2+ (normal)  Ulnar Unable to palpate  Unable to palpate   Brachial 2+ (normal) Unable to palpate   Femoral 2+ (normal) 1+ (weak)  AT monophasic monophasic  PT brisk brisk  Peroneal monophasic monophasic   Extremities: without ischemic changes, without Gangrene , without cellulitis; without open wounds;  Musculoskeletal: no muscle wasting or atrophy  Neurologic: A&O X 3;  No focal weakness or paresthesias are detected; speech is fluent/normal Psychiatric:  The pt has Normal affect. Lymph:  No inguinal lymphadenopathy   CBC    Component Value Date/Time   WBC 9.3 12/21/2017 0400   RBC 4.25 12/21/2017 0400   HGB 11.5 (L) 12/21/2017 0400   HCT 36.7 (L) 12/21/2017 0400   PLT 253 12/21/2017 0400   MCV 86.4 12/21/2017 0400   MCH 27.1 12/21/2017 0400   MCHC 31.3 12/21/2017 0400   RDW 15.9 (H) 12/21/2017 0400   LYMPHSABS 0.9 12/21/2017 0400   MONOABS 0.3 12/21/2017 0400   EOSABS 0.0 12/21/2017 0400   BASOSABS 0.0 12/21/2017 0400    BMET    Component Value Date/Time   NA 133 (L) 12/21/2017 0400   K 4.6 12/21/2017 0400   CL 97 (L) 12/21/2017 0400   CO2 28 12/21/2017 0400   GLUCOSE 219 (H) 12/21/2017 0400   GLUCOSE 94 10/11/2006 1605   BUN 32 (H) 12/21/2017 0400   CREATININE 1.13 12/21/2017 0400   CALCIUM 8.7 (L) 12/21/2017 0400   GFRNONAA 58 (L) 12/21/2017 0400   GFRAA >60 12/21/2017 0400    COAGS: Lab Results  Component Value Date   INR 3.34 12/21/2017   INR 4.58 (HH) 12/20/2017   INR 5.16 (HH)  12/19/2017     Non-Invasive Vascular Imaging:   Carotid Duplex (Doppler) has been completed 12/21/17.  Preliminary findings:   Right Carotid: There is evidence in the right ICA of a 80-99% stenosis. Stenosis appears more severe than prior study 12/2016. Difficult exam due to tissue attenuation and poor windows.    Left Carotid: There is evidence in the left ICA of a 1-39% stenosis. Difficult exam due to tissue attenuation and poor windows.    Vertebrals: Left vertebral artery was patent with antegrade flow. Right vertebral artery was not visualized.     Statin:  Yes.   Beta Blocker:  No. Aspirin:  No. ACEI:  Yes.   ARB:  No. CCB use:  Yes (diltiazem gtt presently) Other antiplatelets/anticoagulants:  Yes.   Coumadin   ASSESSMENT/PLAN: This is a 84 y.o. male admitted with afib with RVR and +RSV found to have severe right carotid artery stenosis and VVS is consulted.    -pt with afib and according to the pt he is to undergo cardioversion in the morning -as far as his carotid artery disease, he had a carotid duplex this morning that revealed an 80-99% right  carotid artery stenosis and he has been asymptomatic.  Pt has inquired about going home and coming back to the office to f/u with Dr. Trula Slade (Dr. Trula Slade has been following him s/p EVAR).   -if he does undergo carotid endarterectomy, he will need to be off his coumadin. -Dr. Scot Dock to see pt this afternoon.  Leontine Locket, PA-C [facility] [phone]  I have interviewed the patient and examined the patient. I agree with the findings by the PA.  CAROTID DUPLEX: I interpreted his carotid duplex scan which shows a greater than 80% right carotid stenosis with no significant stenosis on the left.   Gae Gallop, MD [phone]

## 2017-12-21 NOTE — H&P (View-Only) (Signed)
Advanced Heart Failure Rounding Note  PCP: Dr. Elyse Hsu Primary Cardiologist: Dr. Aundra Dubin  Subjective:     Still in A fib, HR 80's mostly. Dilt drip continues at 10 mg/hr. SBP 100's. Started on 40 mg IV lasix BID yesterday. No change in weight. No I&O's charted.   No CP. Mild SOB with walking to the bathroom but much improved. No dizziness or lightheadedness. Coughing less. He is anxious to go home.   Objective:   Weight Range: 185 lb (83.9 kg) Body mass index is 26.17 kg/m.   Vital Signs:   Temp:  [97.4 F (36.3 C)-97.7 F (36.5 C)] 97.4 F (36.3 C) (01/08 0414) Pulse Rate:  [74-140] 87 (01/08 0414) Resp:  [19-32] 24 (01/08 0414) BP: (99-112)/(62-87) 107/65 (01/08 0414) SpO2:  [94 %-97 %] 94 % (01/08 0414) Weight:  [185 lb (83.9 kg)] 185 lb (83.9 kg) (01/08 0414)    Weight change: Filed Weights   12/19/17 1203 12/20/17 0400 12/21/17 0414  Weight: 188 lb (85.3 kg) 185 lb 3 oz (84 kg) 185 lb (83.9 kg)    Intake/Output:  No intake or output data in the 24 hours ending 12/21/17 0755    Physical Exam   General: Elderly. No resp difficulty. HEENT: Normal Neck: Supple. JVP 12. Carotids 2+ bilat; no bruits. No thyromegaly or nodule noted. Cor: PMI nondisplaced. No M/G/R noted. Irregular Lungs: Diminished throughout. Wheezing. Abdomen: Soft, non-tender, + distension, no HSM. No bruits or masses. +BS  Extremities: No cyanosis, clubbing, or rash. R and LLE no edema.  Neuro: Alert & orientedx3, cranial nerves grossly intact. moves all 4 extremities w/o difficulty. Affect pleasant   Telemetry   A fib 60-80's with rare PVC's. Personally reviewed.  EKG    No new tracings.  Labs    CBC Recent Labs    12/20/17 0304 12/21/17 0400  WBC 4.1 9.3  NEUTROABS  --  8.1*  HGB 11.7* 11.5*  HCT 37.1* 36.7*  MCV 85.7 86.4  PLT 215 631   Basic Metabolic Panel Recent Labs    12/20/17 0304 12/21/17 0400  NA 134* 133*  K 4.9 4.6  CL 99* 97*  CO2 27 28    GLUCOSE 187* 219*  BUN 16 32*  CREATININE 0.86 1.13  CALCIUM 8.5* 8.7*  MG  --  2.0  PHOS  --  3.5   Liver Function Tests Recent Labs    12/21/17 0400  AST 26  ALT 22  ALKPHOS 67  BILITOT 1.2  PROT 6.1*  ALBUMIN 3.0*   No results for input(s): LIPASE, AMYLASE in the last 72 hours. Cardiac Enzymes Recent Labs    12/19/17 1541 12/19/17 2142 12/20/17 0304  TROPONINI <0.03 <0.03 <0.03    BNP: BNP (last 3 results) Recent Labs    12/19/17 1219  BNP 264.6*    ProBNP (last 3 results) No results for input(s): PROBNP in the last 8760 hours.   D-Dimer No results for input(s): DDIMER in the last 72 hours. Hemoglobin A1C No results for input(s): HGBA1C in the last 72 hours. Fasting Lipid Panel No results for input(s): CHOL, HDL, LDLCALC, TRIG, CHOLHDL, LDLDIRECT in the last 72 hours. Thyroid Function Tests Recent Labs    12/19/17 1600  TSH 0.633    Other results:   Imaging     No results found.   Medications:     Scheduled Medications: . allopurinol  300 mg Oral Daily  . atorvastatin  10 mg Oral QODAY  . ezetimibe  10  mg Oral Daily  . finasteride  5 mg Oral Daily  . furosemide  40 mg Intravenous BID  . Influenza vac split quadrivalent PF  0.5 mL Intramuscular Tomorrow-1000  . insulin aspart  0-15 Units Subcutaneous TID WC  . insulin aspart  0-5 Units Subcutaneous QHS  . methylPREDNISolone (SOLU-MEDROL) injection  40 mg Intravenous Q12H  . pneumococcal 23 valent vaccine  0.5 mL Intramuscular Tomorrow-1000  . sodium chloride flush  3 mL Intravenous Q12H  . spironolactone  12.5 mg Oral Daily  . tamsulosin  0.4 mg Oral Daily  . timolol  1 drop Both Eyes BID     Infusions: . sodium chloride    . diltiazem (CARDIZEM) infusion 10 mg/hr (12/21/17 0641)     PRN Medications:  acetaminophen, ondansetron (ZOFRAN) IV, sodium chloride flush    Patient Profile   CORDE ANTONINI is a 84 y.o. male with a medial history of A fib on coumadin (NSR since  DCCV 08/2014), CAD s/p CABG (1989) and redo CABG (2007), AAA s/p repair (2015), CHF, DM, PAD, carotid stenosis, and bioprothestic AVR (2007).  He was admitted on 1/6 with worsening SOB and found to be in A fib RVR.  Assessment/Plan   1. A fib with RVR - Continue Cardizem drip @ 10 mg/hr - Rate now controlled on cardizem - Had hallucinations with amio and fatigue with beta blockers in the past. - TSH normal - On coumadin. INR 3.34 this am. Dosing per pharmacy. - INR has been therapeutic since 11/11/17. Plan for DCCV 1/9 @ 12. - If unsuccessful or recurrence of a fib after DCCV, will likely start tikosyn. - Likely that a fib was triggered by respiratory viral infection.   2. Cough/Respiratory infection/Bronchitis - RSV + for metapneumovirus. Continue droplet precautions - Being treated with solumedrol - Has completed coarse of antibiotics and tamiflu outpatient - Cough improved  3. Acute on Chronic Systolic and Diastolic Heart Failure, Suspected ICM, EF 45-50% Echo 12/2014 EF 55-60% with grade II DD; Echo 01/2017 EF 45-50% with grade I DD LHC 06/2014: LIMA-LAD patent with 40-50% stenosis in LAD after touchdown, sequential SVG-ramus/OM/diagonal with only the ramus branch still intact (known from prior cath), SVG-PDA from original surgery TO, SVG-PDA from redo surgery with long 50-60% mid-graft stenosis. No target for intervention.  - NYHA class III, Volume status mildly elevated - Takes 40/20 alternating Lasix at home. Continue 40 mg IV lasix BID. Creatinine 0.86 > 1.13. Watch renal function closely. If creatinine increases tomorrow, will hold diuretics.  - Continue spiro 12.5 mg daily. - Ramipril on hold. SBP's 100s with dilt drip. - BB previously DC'd due to fatigue - Echo done this am. Not yet read. - BNP 264 on admission, no comparison - Encouraged him to allow output to be recorded. He is agreeable.   4. CAD s/p CABG and redo CABG with AAA repair - On coumadin with stable CAD  so no ASA per Dr. Claris Gladden note - On atorvastatin and zetia. - No s/s ischemia  5. AVR - bioprosthetic  - Stable on echo 02/03/2017 - Repeat echo done this am.  6. Carotid Stenosis - Hx of moderate RICA stenosis.  - No stroke-like symptoms - Carotid dopplers ordered  7. Mechanical Falls - Head CT negative for acute abnormality - No dizziness or lightheadedness prior to falls - PT ordered, not yet seen.  Medication concerns reviewed with patient and pharmacy team. Barriers identified: No. He is very functional and manages his own medications. He does  not have trouble getting any meds. He drives himself to appointments.  Length of Stay: [place], NP  12/21/2017, 7:55 AM  Advanced Heart Failure Team Pager [phone] (M-F; 7a - 4p)  Please contact [facility] for night-coverage after hours (4p -7a ) and weekends on [email or web address]  Patient seen with NP, agree with the above note.  He seems to be breathing better today. I/Os not recorded but he says that he urinated a lot.  Still some volume overload on exam. He remains in rate-controlled atrial fibrillation on diltiazem gtt.   Echo: EF 45-50%, stable bioprosthetic aortic valve (no change).   Carotid dopplers: 70-99% RICA stenosis.   1. Acute hypoxemic respiratory failure: Improved now, off oxygen.  Suspect acute bronchitis from metapneumovirus with triggering of atrial fibrillation/RVR and acute on chronic diastolic CHF.  - Treated with oseltamivir and abx as outpatient.  2. Acute on chronic diastolic CHF: Echo this admission stable with EF 45-50%, ischemic cardiomyopathy. Takes Lasix 40 alt 20 at home.  On exam, he remains volume overloaded.  - Need strict I/Os  - Continue Lasix 40 mg IV bid today.  Creatinine is up a bit, watch closely.  - He tolerated Toprol XL poorly and this was stopped in the past.  He has been on ramipril 7.5 daily and spironolactone 25 daily at home.  Continue current spironolactone here.  BP soft on  diltiazem gtt, will restart ramipril after DCCV and off diltiazem.  3. Atrial fibrillation: Persistent, with RVR.  Suspect this was triggered by viral syndrome.  The last time he was cardioverted was in 2015.  Suspect afib/RVR has triggered CHF exacerbation.  - Will arrange for DCCV, will need to be Wednesday due to lack of availability today.  INR has been therapeutic since November and is therapeutic here so no TEE required. Discussed risks/benefits of procedure with patient and he agrees.  - Continue diltiazem gtt for now, not ideal with EF 45-50% but had significant side effects with amiodarone.  - If he does not convert to NSR or goes back to afib quickly, would consider Tikosyn use (QTc ok for this).  - Continue warfarin.  4. CAD: s/p CABG.  No chest pain.  Continue statin, Zetia.  5. Carotid stenosis: Severe RICA stenosis on right.  He is asymptomatic.  VVS has seen.  Will need right CEA. After DCCV, will need to be anticoagulated x 1 month without interrupting.  At that point, could hold warfarin for CEA.   [name] 12/21/2017 1:12 PM

## 2017-12-21 NOTE — Progress Notes (Signed)
Echocardiogram 2D Echocardiogram has been performed.  Nathaniel [name] 12/21/2017, 9:21 AM

## 2017-12-21 NOTE — Progress Notes (Signed)
Inpatient Diabetes Program Recommendations  AACE/ADA: New Consensus Statement on Inpatient Glycemic Control (2015)  Target Ranges:  Prepandial:   less than 140 mg/dL      Peak postprandial:   less than 180 mg/dL (1-2 hours)      Critically ill patients:  140 - 180 mg/dL   Lab Results  Component Value Date   GLUCAP 218 (H) 12/21/2017   HGBA1C 6.5 05/08/2009    RResults for Nathaniel, [name] (MRN [id number]) as of 12/21/2017 09:35  Ref. Range 12/20/2017 06:18 12/20/2017 11:12 12/20/2017 16:21 12/20/2017 20:53 12/21/2017 05:51  Glucose-Capillary Latest Ref Range: 65 - 99 mg/dL 192 (H) 229 (H) 170 (H) 255 (H) 218 (H)  Review of Glycemic Control  Diabetes history: DM2 Outpatient Diabetes medications: Januvia 100 mg Current orders for Inpatient glycemic control: Novolog moderate correction  Inpatient Diabetes Program Recommendations:   While in the hospital on steroids, consider: -Lantus 9 units daily (approx. 0.1 unit/kg)  Thank you, Nani Gasser. Shiheem Corporan, RN, MSN, CDE  Diabetes Coordinator Inpatient Glycemic Control Team Team Pager [phone] (8am-5pm) 12/21/2017 9:37 AM

## 2017-12-21 NOTE — Progress Notes (Signed)
[facility] for warfarin Indication: atrial fibrillation  No Known Allergies  Patient Measurements: Height: 5' 10.5" (179.1 cm) Weight: 185 lb (83.9 kg) IBW/kg (Calculated) : 74.15  Vital Signs: Temp: 97.4 F (36.3 C) (01/08 0414) Temp Source: Oral (01/08 0414) BP: 107/65 (01/08 0414) Pulse Rate: 87 (01/08 0414)  Labs: Recent Labs    12/19/17 1219 12/19/17 1238 12/19/17 1541 12/19/17 2142 12/20/17 0304 12/21/17 0400  HGB 12.3*  --   --   --  11.7* 11.5*  HCT 39.0  --   --   --  37.1* 36.7*  PLT 229  --   --   --  215 253  LABPROT  --  47.2*  --   --  43.0* 33.6*  INR  --  5.16*  --   --  4.58* 3.34  CREATININE 0.92  --   --   --  0.86 1.13  TROPONINI  --   --  <0.03 <0.03 <0.03  --     Estimated Creatinine Clearance: 52 mL/min (by C-G formula based on SCr of 1.13 mg/dL).   Medical History: Past Medical History:  Diagnosis Date  . Atrial fibrillation ([place])   . Atrial fibrillation ([place])   . CAD (coronary artery disease)   . Cataract   . CHF (congestive heart failure) ([place])   . Colon polyps    adenomatous  . Diabetes mellitus   . Diverticulosis   . GERD (gastroesophageal reflux disease)   . Gout   . Hernia, abdominal   . Hx: UTI (urinary tract infection)   . Hyperlipemia   . Hypertension   . Internal hemorrhoids   . Peripheral vascular disease ([place])   . Shortness of breath     Assessment: 84 yo male on warfarin PTA for hx of afib admitted 1/6 with concern for URI and afib with RVR. Per anticoagulation clinic note on 12/27, warfarin regimen is 6 mg daily except 9 mg on M,W,F.   INR on admission 5.16. Today INR changed from 4.58 to 3.34. Hgb is trending down slightly, platelets remain stable. No signs/symptoms of bleeding. Currently on steroids, which can impact warfarin sensitivity. Plan for possible cardioversion tomorrow.   Goal of Therapy:  INR 2-3 Monitor platelets by anticoagulation protocol: Yes   Plan:   Order warfarin 1 mg tonight to keep INR therapeutic for poss DCCV Daily INR, CBC  Doylene Canard, PharmD Clinical Pharmacist  Pager: [phone] Clinical Phone for 12/21/2017 until 3:30pm: [phone] If after 3:30pm, please call main pharmacy at [phone] 12/21/2017,10:45 AM

## 2017-12-21 NOTE — Progress Notes (Signed)
*  PRELIMINARY RESULTS* Vascular Ultrasound Carotid Duplex (Doppler) has been completed.  Preliminary findings:   Right Carotid: There is evidence in the right ICA of a 80-99% stenosis. Stenosis appears more severe than prior study 12/2016. Difficult exam due to tissue attenuation and poor windows.    Left Carotid: There is evidence in the left ICA of a 1-39% stenosis. Difficult exam due to tissue attenuation and poor windows.    Vertebrals: Left vertebral artery was patent with antegrade flow. Right vertebral artery was not visualized.   Paged Dr. Alfredia Ferguson @ 10:15.  Everrett Coombe 12/21/2017, 10:19 AM

## 2017-12-22 ENCOUNTER — Encounter (HOSPITAL_COMMUNITY): Payer: Self-pay

## 2017-12-22 ENCOUNTER — Telehealth: Payer: Self-pay | Admitting: Vascular Surgery

## 2017-12-22 ENCOUNTER — Inpatient Hospital Stay (HOSPITAL_COMMUNITY): Payer: Medicare Other | Admitting: Certified Registered Nurse Anesthetist

## 2017-12-22 ENCOUNTER — Encounter (HOSPITAL_COMMUNITY)
Admission: EM | Disposition: A | Discharge status: Home Health | Payer: Self-pay | Source: Home / Self Care | Attending: Internal Medicine

## 2017-12-22 DIAGNOSIS — J211 Acute bronchiolitis due to human metapneumovirus: Secondary | ICD-10-CM

## 2017-12-22 DIAGNOSIS — I10 Essential (primary) hypertension: Secondary | ICD-10-CM

## 2017-12-22 DIAGNOSIS — I5032 Chronic diastolic (congestive) heart failure: Secondary | ICD-10-CM

## 2017-12-22 DIAGNOSIS — I25810 Atherosclerosis of coronary artery bypass graft(s) without angina pectoris: Secondary | ICD-10-CM

## 2017-12-22 DIAGNOSIS — I5022 Chronic systolic (congestive) heart failure: Secondary | ICD-10-CM

## 2017-12-22 HISTORY — PX: CARDIOVERSION: SHX1299

## 2017-12-22 LAB — COMPREHENSIVE METABOLIC PANEL
ALT: 24 U/L (ref 17–63)
AST: 27 U/L (ref 15–41)
Albumin: 2.9 g/dL — ABNORMAL LOW (ref 3.5–5.0)
Alkaline Phosphatase: 78 U/L (ref 38–126)
Anion gap: 10 (ref 5–15)
BUN: 41 mg/dL — ABNORMAL HIGH (ref 6–20)
CHLORIDE: 96 mmol/L — AB (ref 101–111)
CO2: 28 mmol/L (ref 22–32)
Calcium: 8.7 mg/dL — ABNORMAL LOW (ref 8.9–10.3)
Creatinine, Ser: 1.19 mg/dL (ref 0.61–1.24)
GFR calc non Af Amer: 55 mL/min — ABNORMAL LOW (ref >60)
Glucose, Bld: 244 mg/dL — ABNORMAL HIGH (ref 65–99)
POTASSIUM: 4.9 mmol/L (ref 3.5–5.1)
SODIUM: 134 mmol/L — AB (ref 135–145)
Total Bilirubin: 0.8 mg/dL (ref 0.3–1.2)
Total Protein: 5.9 g/dL — ABNORMAL LOW (ref 6.5–8.1)

## 2017-12-22 LAB — CBC WITH DIFFERENTIAL/PLATELET
BASOS ABS: 0 10*3/uL (ref 0.0–0.1)
Basophils Relative: 0 %
EOS ABS: 0 10*3/uL (ref 0.0–0.7)
EOS PCT: 0 %
HCT: 37.3 % — ABNORMAL LOW (ref 39.0–52.0)
Hemoglobin: 11.8 g/dL — ABNORMAL LOW (ref 13.0–17.0)
Lymphocytes Relative: 6 %
Lymphs Abs: 0.7 10*3/uL (ref 0.7–4.0)
MCH: 27.1 pg (ref 26.0–34.0)
MCHC: 31.6 g/dL (ref 30.0–36.0)
MCV: 85.6 fL (ref 78.0–100.0)
MONO ABS: 0.4 10*3/uL (ref 0.1–1.0)
Monocytes Relative: 4 %
Neutro Abs: 10.5 10*3/uL — ABNORMAL HIGH (ref 1.7–7.7)
Neutrophils Relative %: 90 %
PLATELETS: 276 10*3/uL (ref 150–400)
RBC: 4.36 MIL/uL (ref 4.22–5.81)
RDW: 15.3 % (ref 11.5–15.5)
WBC: 11.7 10*3/uL — AB (ref 4.0–10.5)

## 2017-12-22 LAB — GLUCOSE, CAPILLARY
GLUCOSE-CAPILLARY: 155 mg/dL — AB (ref 65–99)
Glucose-Capillary: 216 mg/dL — ABNORMAL HIGH (ref 65–99)

## 2017-12-22 LAB — PROTIME-INR
INR: 2.9
PROTHROMBIN TIME: 30.1 s — AB (ref 11.4–15.2)

## 2017-12-22 LAB — MAGNESIUM: MAGNESIUM: 2.1 mg/dL (ref 1.7–2.4)

## 2017-12-22 LAB — PHOSPHORUS: PHOSPHORUS: 3.2 mg/dL (ref 2.5–4.6)

## 2017-12-22 SURGERY — CARDIOVERSION
Anesthesia: General

## 2017-12-22 MED ORDER — IPRATROPIUM-ALBUTEROL 0.5-2.5 (3) MG/3ML IN SOLN: 3.0000 mL | Freq: Four times a day (QID) | RESPIRATORY_TRACT | 0 refills | Status: DC

## 2017-12-22 MED ORDER — METOPROLOL SUCCINATE ER 25 MG PO TB24: 25.0000 mg | ORAL_TABLET | Freq: Every day | ORAL | 0 refills | Status: DC

## 2017-12-22 MED ORDER — METOPROLOL SUCCINATE ER 25 MG PO TB24
25.0000 mg | ORAL_TABLET | Freq: Every day | ORAL | Status: DC
  Administered 2017-12-22: 25 mg via ORAL
  Filled 2017-12-22: qty 1

## 2017-12-22 MED ORDER — LIDOCAINE HCL (CARDIAC) 20 MG/ML IV SOLN
INTRAVENOUS | Status: DC | PRN
Start: 2017-12-22 — End: 2017-12-22
  Administered 2017-12-22: 100 mg via INTRATRACHEAL

## 2017-12-22 MED ORDER — PROPOFOL 10 MG/ML IV BOLUS
INTRAVENOUS | Status: DC | PRN
  Administered 2017-12-22: 40 mg via INTRAVENOUS

## 2017-12-22 MED ORDER — SPIRONOLACTONE 25 MG PO TABS: 12.5000 mg | ORAL_TABLET | Freq: Every day | ORAL | 6 refills | Status: DC

## 2017-12-22 MED ORDER — FUROSEMIDE 40 MG PO TABS: 40.0000 mg | ORAL_TABLET | Freq: Every day | ORAL | Status: DC

## 2017-12-22 MED ORDER — IPRATROPIUM-ALBUTEROL 0.5-2.5 (3) MG/3ML IN SOLN
3.0000 mL | Freq: Four times a day (QID) | RESPIRATORY_TRACT | Status: DC
  Administered 2017-12-22: 3 mL via RESPIRATORY_TRACT
  Filled 2017-12-22: qty 3

## 2017-12-22 NOTE — Progress Notes (Signed)
   Maintaining NSR.   Anxious to go home.  BP 111/56.  Sats on room air 97%.   HF follow up set up.  OK to go home from cardiac standpoint.   Per Dr Aundra Dubin. Cardiac meds for home: Lasix 40 mg daily alternating with 20 mg daily, Toprol XL 25 mg daily, spironolactone 12.5 daily, warfarin, atorvastatin 10 daily, Zetia 10 daily.  Would hold off on ramipril for now.   Ray Glacken NP-C  2:46 PM

## 2017-12-22 NOTE — Consult Note (Signed)
[facility] CM Primary Care Navigator  12/22/2017  BERNAL LUHMAN 02/15/1932 [id number]   Went to see patient at the bedside to identify possible discharge needs and met with granddaughter [name]) as well. Patient reports having persistent shortness of breath, "bad cough"thathad led to this admission.  PatientendorsesDr.Michael Altheimer with [facility] care provider and mentioned that cardiologist is Dr. Aundra Dubin.   Patient shared [facility] to obtain medications without difficulty.  Patientreportsmanaginghis ownmedications at home pouring into small cups instead of "pill box" per granddaughter.  Patient mentioned that he has been driving prior to admission. His granddaughter or his business assistant Otis Dials) will be able to provide transportation to his doctors' appointments if needed after discharge.  Patient states that business assistant or granddaughter(lives nearby) can provide helpwith his care needs at home when needed.   Anticipated discharge plan is homewith home health services per therapy recommendation according topatient.   Patient and granddaughter voiced understanding to call primary care provider's office when he returns home, for a post discharge follow-up appointment within1-2 weeksor sooner if needs arise. Patient letter (with PCP's contact number) was provided as their reminder.   Per MD note, patient was admitted for Atrial Fibrillation with rapid ventricular response (RVR), Viral Bronchitis, and also found to be in exacerbation of congestive HF.  He indicated having limited awarenessof ways to manage heart failure.He reports not monitoring/ recording his weight daily. Encouraged granddaughter to assist patient at home to weigh regularly and record as well as adhere to diet restrictions and medications. Patient recalls HF signs and symptoms needing medical assistance.   Patient also reports having diabetes that is under control with recent A1c of 6.3 which is monitored and managed by primary care provider.  Explained to patient and granddaughter about [facility] care management services available for him at homebut he adamantly declined having home visits. Patient had only opted and verbally agreed for [facility] Heart Failure calls to follow-up with his recovery at home.   Referral made for EMMI HF calls to monitor recovery after discharge.   Patient and granddaughter were encouraged and both expressed understanding of need to seek referral to [facility] care management from primary care provider (if patient changes his mind/ agrees to services) when needing any assistance in the near future.  THNcare managementinformation provided for future needs that may arise.   For additional questions please contact:  Edwena Felty A. Teegan Brandis, BSN, RN-BC [facility] PRIMARY CARE Navigator Cell: [phone]

## 2017-12-22 NOTE — Progress Notes (Addendum)
Physical Therapy Treatment Patient Details Name: Nathaniel [name] MRN: [id number] DOB: [birth year] Today's Date: 12/22/2017    History of Present Illness Pt admitted 12/19/17 with SOB; found to have afib with RVR as well as viral illness. Scheduled for cardioversion 1/9. PMH includes CABG, afib, chf, dm, htn, pvd, gout.   PT Comments    Pt progressing well with mobility. Continues to require supervision for balance while ambulating. Able to amb 250' before requiring 1x seated rest break secondary to DOE 2/4; HR up to 112 while walking. Supervision for safety ambulating throughout room, but pt with improved ability to complete ADLs safely. Educ on activity pacing techniques and fall risk reduction. Will continue to follow acutely.    Follow Up Recommendations  Home health PT     Equipment Recommendations  None recommended by PT    Recommendations for Other Services       Precautions / Restrictions Precautions Precautions: Fall Restrictions Weight Bearing Restrictions: No    Mobility  Bed Mobility Overal bed mobility: Independent                Transfers Overall transfer level: Needs assistance Equipment used: None   Sit to Stand: Supervision         General transfer comment: Pt with initial instability upon standing, but able to self-correct. Supervision for safety  Ambulation/Gait Ambulation/Gait assistance: Min guard;Supervision Ambulation Distance (Feet): 270 Feet Assistive device: None Gait Pattern/deviations: Step-through pattern;Decreased step length - right;Decreased step length - left;Wide base of support Gait velocity: Decreased   General Gait Details: Slightly unsteady ambulation, requiring min guard for balance, progressing to supervision. 1x seated rest break after 250' due to SOB. Upon return to room, stability improved with amb to<>from bathroom; supervision for safety and balance   Stairs            Wheelchair Mobility    Modified Rankin  (Stroke Patients Only)       Balance Overall balance assessment: Needs assistance Sitting-balance support: No upper extremity supported;Feet supported Sitting balance-Leahy Scale: Good     Standing balance support: No upper extremity supported;During functional activity Standing balance-Leahy Scale: Fair Standing balance comment: Able to void standing at toilet and squat down to pull up underwear indep                            Cognition Arousal/Alertness: Awake/alert Behavior During Therapy: WFL for tasks assessed/performed Overall Cognitive Status: Within Functional Limits for tasks assessed                                        Exercises      General Comments General comments (skin integrity, edema, etc.): HR up to 112 with ambulation; DOE 2/4      Pertinent Vitals/Pain Pain Assessment: No/denies pain    Home Living                      Prior Function            PT Goals (current goals can now be found in the care plan section) Acute Rehab PT Goals Patient Stated Goal: return home PT Goal Formulation: With patient Time For Goal Achievement: 12/28/17 Potential to Achieve Goals: Good Progress towards PT goals: Progressing toward goals    Frequency    Min 3X/week  PT Plan Current plan remains appropriate    Co-evaluation              AM-PAC PT "6 Clicks" Daily Activity  Outcome Measure  Difficulty turning over in bed (including adjusting bedclothes, sheets and blankets)?: None Difficulty moving from lying on back to sitting on the side of the bed? : None Difficulty sitting down on and standing up from a chair with arms (e.g., wheelchair, bedside commode, etc,.)?: A Little Help needed moving to and from a bed to chair (including a wheelchair)?: None Help needed walking in hospital room?: A Little Help needed climbing 3-5 steps with a railing? : A Little 6 Click Score: 21    End of Session Equipment  Utilized During Treatment: Gait belt Activity Tolerance: Patient tolerated treatment well Patient left: in chair;with call bell/phone within reach Nurse Communication: Mobility status PT Visit Diagnosis: Unsteadiness on feet (R26.81)     Time: 3435-6861 PT Time Calculation (min) (ACUTE ONLY): 24 min  Charges:  $Gait Training: 23-37 mins                    G Codes:      Mabeline Caras, PT, DPT Acute Rehab Services  Pager: [place] 12/22/2017, 8:32 AM

## 2017-12-22 NOTE — Anesthesia Postprocedure Evaluation (Signed)
Anesthesia Post Note  Patient: LAURANCE HEIDE  Procedure(s) Performed: CARDIOVERSION (N/A )     Patient location during evaluation: PACU Anesthesia Type: General Level of consciousness: awake and alert Pain management: pain level controlled Vital Signs Assessment: post-procedure vital signs reviewed and stable Respiratory status: spontaneous breathing, nonlabored ventilation, respiratory function stable and patient connected to nasal cannula oxygen Cardiovascular status: blood pressure returned to baseline and stable Postop Assessment: no apparent nausea or vomiting Anesthetic complications: no    Last Vitals:  Vitals:   12/22/17 1246 12/22/17 1250  BP: (!) 106/59 (!) 108/59  Pulse: (!) 57 (!) 58  Resp: 17 16  Temp:    SpO2: 97% 98%    Last Pain:  Vitals:   12/22/17 1229  TempSrc: Oral  PainSc:                  Nickie Deren DAVID

## 2017-12-22 NOTE — Progress Notes (Signed)
  Progress Note    12/22/2017 10:16 AM * No surgery date entered *  Subjective:  No new complaints   Vitals:   12/22/17 0315 12/22/17 0417  BP: 105/68 (!) 111/56  Pulse:  64  Resp: 19   Temp:  (!) 97.5 F (36.4 C)  SpO2:  94%   Physical Exam: Lungs:  Non labored Incisions:  none Extremities:  Moving all extremities well Abdomen:  soft Neurologic: CN grossly intact  CBC    Component Value Date/Time   WBC 11.7 (H) 12/22/2017 0210   RBC 4.36 12/22/2017 0210   HGB 11.8 (L) 12/22/2017 0210   HCT 37.3 (L) 12/22/2017 0210   PLT 276 12/22/2017 0210   MCV 85.6 12/22/2017 0210   MCH 27.1 12/22/2017 0210   MCHC 31.6 12/22/2017 0210   RDW 15.3 12/22/2017 0210   LYMPHSABS 0.7 12/22/2017 0210   MONOABS 0.4 12/22/2017 0210   EOSABS 0.0 12/22/2017 0210   BASOSABS 0.0 12/22/2017 0210    BMET    Component Value Date/Time   NA 134 (L) 12/22/2017 0210   K 4.9 12/22/2017 0210   CL 96 (L) 12/22/2017 0210   CO2 28 12/22/2017 0210   GLUCOSE 244 (H) 12/22/2017 0210   GLUCOSE 94 10/11/2006 1605   BUN 41 (H) 12/22/2017 0210   CREATININE 1.19 12/22/2017 0210   CALCIUM 8.7 (L) 12/22/2017 0210   GFRNONAA 55 (L) 12/22/2017 0210   GFRAA >60 12/22/2017 0210    INR    Component Value Date/Time   INR 2.90 12/22/2017 0210     Intake/Output Summary (Last 24 hours) at 12/22/2017 1016 Last data filed at 12/22/2017 0600 Gross per 24 hour  Intake 1310.41 ml  Output 1300 ml  Net 10.41 ml     Assessment/Plan:  84 y.o. male with asymptomatic >80% stenosis R ICA  Patient is neurologically intact Plan is for cardioversion today Uninterrupted anticoagulation for 1 month He will follow up as an outpatient with Dr. Trula Slade to discuss/schedule R CEA Vascular surgery will sign off   Dagoberto Ligas, PA-C [facility] [phone] 12/22/2017 10:16 AM

## 2017-12-22 NOTE — Transfer of Care (Signed)
Immediate Anesthesia Transfer of Care Note  Patient: Nathaniel [name]  Procedure(s) Performed: CARDIOVERSION (N/A )  Patient Location: Endoscopy Unit  Anesthesia Type:General  Level of Consciousness: drowsy  Airway & Oxygen Therapy: Patient Spontanous Breathing and Patient connected to nasal cannula oxygen  Post-op Assessment: Report given to RN and Post -op Vital signs reviewed and stable  Post vital signs: Reviewed and stable  Last Vitals:  Vitals:   12/22/17 1144 12/22/17 1222  BP: 129/77 (!) 82/44  Pulse: (!) 107 (!) 51  Resp: 20 15  Temp: (!) 36.4 C   SpO2: 95% 97%    Last Pain:  Vitals:   12/22/17 1144  TempSrc: Oral  PainSc:          Complications: No apparent anesthesia complications

## 2017-12-22 NOTE — Anesthesia Preprocedure Evaluation (Addendum)
Anesthesia Evaluation  Patient identified by MRN, date of birth, ID band Patient awake    Reviewed: Allergy & Precautions, NPO status , Patient's Chart, lab work & pertinent test results  Airway Mallampati: II  TM Distance: >3 FB Neck ROM: Full    Dental  (+) Teeth Intact   Pulmonary former smoker,    Pulmonary exam normal        Cardiovascular hypertension, Pt. on medications + CAD  Normal cardiovascular exam     Neuro/Psych    GI/Hepatic GERD  Medicated,  Endo/Other  diabetes, Type 2, Oral Hypoglycemic Agents  Renal/GU      Musculoskeletal   Abdominal   Peds  Hematology   Anesthesia Other Findings   Reproductive/Obstetrics                            Anesthesia Physical Anesthesia Plan  ASA: III  Anesthesia Plan: General   Post-op Pain Management:    Induction: Intravenous  PONV Risk Score and Plan: 2 and Treatment may vary due to age or medical condition  Airway Management Planned: Mask  Additional Equipment:   Intra-op Plan:   Post-operative Plan:   Informed Consent: I have reviewed the patients History and Physical, chart, labs and discussed the procedure including the risks, benefits and alternatives for the proposed anesthesia with the patient or authorized representative who has indicated his/her understanding and acceptance.   Dental advisory given  Plan Discussed with: CRNA and Surgeon  Anesthesia Plan Comments:        Anesthesia Quick Evaluation

## 2017-12-22 NOTE — Telephone Encounter (Signed)
Sched appt 01/10/18 at 10:00. Lm on  Hm#.

## 2017-12-22 NOTE — Telephone Encounter (Signed)
-----   Message from Willy Eddy, RN sent at 12/21/2017  3:26 PM EST ----- Regarding: FW: charge   ----- Message ----- From: Mena Goes, RN Sent: 12/21/2017   2:22 PM To: Willy Eddy, RN Subject: FW: charge                                       ----- Message ----- From: Angelia Mould, MD Sent: 12/21/2017   1:58 PM To: [facility] Charge Pool Subject: charge                                         Level 4 consult.  He has a greater than 80% right carotid stenosis which is asymptomatic. This is a patient of Dr. Stephens Shire.  He is scheduled for cardioversion tomorrow and then will need to be on uninterrupted Coumadin for 1 month.  After 1 month, he can hold his Coumadin for right carotid endarterectomy.  He needs an appointment with Dr. Trula Slade in 3 weeks to discuss right carotid endarterectomy. CD

## 2017-12-22 NOTE — Procedures (Signed)
Electrical Cardioversion Procedure Note SAHITH NURSE [id number] 1932-03-03  Procedure: Electrical Cardioversion Indications:  Atrial Fibrillation  Procedure Details Consent: Risks of procedure as well as the alternatives and risks of each were explained to the (patient/caregiver).  Consent for procedure obtained. Time Out: Verified patient identification, verified procedure, site/side was marked, verified correct patient position, special equipment/implants available, medications/allergies/relevent history reviewed, required imaging and test results available.  Performed  Patient placed on cardiac monitor, pulse oximetry, supplemental oxygen as necessary.  Sedation given: Propofol per anesthesiology Pacer pads placed anterior and posterior chest.  Cardioverted 1 time(s).  Cardioverted at [place].  Evaluation Findings: Post procedure EKG shows: NSR Complications: None Patient did tolerate procedure well.   [name] 12/22/2017, 12:25 PM

## 2017-12-22 NOTE — Interval H&P Note (Signed)
History and Physical Interval Note:  12/22/2017 12:14 PM  Nathaniel [name]  has presented today for surgery, with the diagnosis of atrial fibrillation  The various methods of treatment have been discussed with the patient and family. After consideration of risks, benefits and other options for treatment, the patient has consented to  Procedure(s): CARDIOVERSION (N/A) as a surgical intervention .  The patient's history has been reviewed, patient examined, no change in status, stable for surgery.  I have reviewed the patient's chart and labs.  Questions were answered to the patient's satisfaction.     Sakari Alkhatib [employer]

## 2017-12-22 NOTE — Progress Notes (Signed)
Inpatient Diabetes Program Recommendations  AACE/ADA: New Consensus Statement on Inpatient Glycemic Control (2015)  Target Ranges:  Prepandial:   less than 140 mg/dL      Peak postprandial:   less than 180 mg/dL (1-2 hours)      Critically ill patients:  140 - 180 mg/dL   Lab Results  Component Value Date   GLUCAP 216 (H) 12/22/2017   HGBA1C 6.5 05/08/2009    Review of Glycemic Control Results for EDI, GORNIAK (MRN [id number]) as of 12/22/2017 10:07  Ref. Range 12/21/2017 11:42 12/21/2017 16:25 12/21/2017 18:45 12/21/2017 20:41 12/22/2017 06:02  Glucose-Capillary Latest Ref Range: 65 - 99 mg/dL 259 (H) 340 (H) 357 (H) 262 (H) 216 (H)   Diabetes history: DM2 Outpatient Diabetes medications: Januvia 100 mg Current orders for Inpatient glycemic control: Novolog moderate correction  Inpatient Diabetes Program Recommendations:   While in the hospital on steroids, consider: -Lantus 9 units daily (approx. 0.1 unit/kg)  Thank you, Nani Gasser. Brealyn Baril, RN, MSN, CDE  Diabetes Coordinator Inpatient Glycemic Control Team Team Pager [phone] (8am-5pm) 12/22/2017 10:07 AM

## 2017-12-22 NOTE — Discharge Summary (Signed)
Physician Discharge Summary  SHAW DOBEK JOA:[id number] DOB: 26-Nov-1932 DOA: 12/19/2017  PCP: Lorne Skeens, MD  Admit date: 12/19/2017 Discharge date: 12/22/2017  Admitted From: Home Disposition: Home  Recommendations for Outpatient Follow-up:  1. Follow up with PCP in 1 week 2. Follow up with Cardiology and Vascular surgery 3. Please obtain BMP/CBC in one week 4. Please follow up on the following pending results: None  Home Health: PT/OT Equipment/Devices: None  Discharge Condition: Stable CODE STATUS: Full code Diet recommendation: Heart healthy   Brief/Interim Summary:  Admission HPI written by Dyanne Carrel, NP   Chief Complaint: sob/cough/  HPI: Nathaniel [name] is a 84 y.o. male with medical history significant for A. fib, CAD status post CABG, CHF, diabetes, GERD, aVR on Coumadin presents to the emergency Department chief complaint persistent shortness of breath and cough. Initial evaluation reveals atrial fibrillation with rapid ventricular response. Right hospitalists are asked to admit  Information is obtained from the patient and the chart. He states he is had nasal congestion and chest congestion for the last week or so. He has completed a five-day course of antibiotics. He got no better and in fact became worse yesterday. Associated symptoms include productive cough or wheezing intermittent subjective fever. He states he was also being treated with Tamiflu. He denies chest pain palpitations lower extremity edema. He does endorse some orthopnea but that is not new for him. He denies abdominal pain nausea vomiting diarrhea constipation melena bright red blood per rectum. He denies headache dizziness syncope or near-syncope. He does admit to "falling out of my chair" last week but describes a mechanical fall. Reports he's been eating and eating his normal amount and denies any unintentional weight loss.    Hospital course:  Atrial fibrillation with RVR Patient with a  previous history of cardioversion.  Cardiology was consulted.  Patient started on Cardizem drip.  Echocardiogram was obtained and was significant for an EF of 45-50%.  Patient went DC cardioversion on 1/9, which was successful.  Diltiazem was discontinued and patient was started on metoprolol XL on discharge.   Metapneumovirus infection Patient was treated with Tamiflu prior to arrival to the hospital.  While inpatient, patient was started on IV steroids in addition to DuoNeb.  Symptoms improved.  Levaquin was started on admission but was discontinued with positive viral panel.  Patient weaned to room air prior to discharge.  Acute on chronic combined systolic and diastolic heart failure Echo significant for an EF of 45-50%.  Patient was diuresed with IV Lasix twice daily with good response.  Ramipril was discontinued on discharge.  Spironolactone was started.  CAD No chest pain.  Right carotid artery stenosis Vascular surgery consulted and recommended outpatient follow-up. Patient will need to be off of anticoagulation. Per cardiology, he will need anticoagulation uninterrupted for the next month at least. Outpatient vascular surgery follow-up.  Fall Patient with benign neuro exam. CT head significant for no acute intracranial abnormality. PT and OT recommended home health.  Essential hypertension Slightly hypotensive in setting of Cardizem drip. Ramipril discontinued and spironolactone started.  Diabetes mellitus Sliding scale while inpatient. Continue sitagliptin on discharge.  Hyperlipidemia Continued atorvastatin and Zetia. Atorvastatin 10 mg daily.  BPH Continued home regimen  Discharge Diagnoses:  Principal Problem:   Atrial fibrillation with rapid ventricular response (HCC) Active Problems:   CAD (coronary artery disease) of artery bypass graft   Abdominal aortic aneurysm (HCC)   History of aortic valve replacement with bioprosthetic valve   Essential hypertension,  malignant   Chronic diastolic CHF (congestive heart failure) (HCC)   Chronic systolic CHF (congestive heart failure) (HCC)   Cough   Acute bronchitis   Acute on chronic combined systolic and diastolic CHF (congestive heart failure) [facility])    Discharge Instructions  Discharge Instructions    Diet - low sodium heart healthy   Complete by:  As directed    Increase activity slowly   Complete by:  As directed      Allergies as of 12/22/2017   No Known Allergies     Medication List    STOP taking these medications   ramipril 2.5 MG capsule Commonly known as:  ALTACE   ramipril 5 MG capsule Commonly known as:  ALTACE     TAKE these medications   allopurinol 300 MG tablet Commonly known as:  ZYLOPRIM Take 300 mg by mouth daily.   atorvastatin 10 MG tablet Commonly known as:  LIPITOR Take 1 tablet (10 mg total) by mouth daily. What changed:    when to take this  Another medication with the same name was removed. Continue taking this medication, and follow the directions you see here.   CO Q10 PO Take 1 capsule by mouth daily.   doxazosin 1 MG tablet Commonly known as:  CARDURA Take 1 mg by mouth in the evening   doxazosin 2 MG tablet Commonly known as:  CARDURA Take 2 mg by mouth every morning.   ezetimibe 10 MG tablet Commonly known as:  ZETIA Take 10 mg by mouth daily.   finasteride 5 MG tablet Commonly known as:  PROSCAR Take 5 mg by mouth daily.   furosemide 20 MG tablet Commonly known as:  LASIX TAKE ONE TABLET ALTERNATING WITH 40MG  INTHE MORNING What changed:  See the new instructions.   furosemide 40 MG tablet Commonly known as:  LASIX TAKE ONE TABLET ALTERNATING WITH 20MG  INTHE MORNING What changed:  See the new instructions.   glucosamine-chondroitin 500-400 MG tablet Take 1 tablet by mouth daily.   ipratropium-albuterol 0.5-2.5 (3) MG/3ML Soln Commonly known as:  DUONEB Take 3 mLs by nebulization every 6 (six) hours.   latanoprost 0.005  % ophthalmic solution Commonly known as:  XALATAN Instill 1 drop into the affected eye at bedtime   metoprolol succinate 25 MG 24 hr tablet Commonly known as:  TOPROL-XL Take 1 tablet (25 mg total) by mouth daily. Start taking on:  12/23/2017   OCUVITE PO Take 1 tablet by mouth daily.   OMEGA-3 PO Take 1 capsule by mouth daily.   sildenafil 50 MG tablet Commonly known as:  VIAGRA Take 50 mg by mouth daily as needed for erectile dysfunction.   sitaGLIPtin 100 MG tablet Commonly known as:  JANUVIA Take 100 mg by mouth every other day.   spironolactone 25 MG tablet Commonly known as:  ALDACTONE Take 0.5 tablets (12.5 mg total) by mouth daily. Start taking on:  12/23/2017 What changed:  See the new instructions.   tamsulosin 0.4 MG Caps capsule Commonly known as:  FLOMAX Take 0.4 mg by mouth daily.   timolol 0.5 % ophthalmic solution Commonly known as:  TIMOPTIC Place 1 drop into the right eye daily.   warfarin 3 MG tablet Commonly known as:  COUMADIN Take as directed. If you are unsure how to take this medication, talk to your nurse or doctor. Original instructions:  Take 2 tablets (6 mg) by mouth on Sun, Tues, Thurs and Sat and Take 3 tablets (9 mg) by mouth  on MWF What changed:    how much to take  how to take this  when to take this  additional instructions      Follow-up Information    Serafina Mitchell, MD Follow up in 3 week(s).   Specialties:  Vascular Surgery, Cardiology Contact information: [place] [address] [phone]        Larey Dresser, MD Follow up on 12/30/2017.   Specialty:  Cardiology Why:  at [place] Contact information: [address] [place] [address] [phone]        Altheimer, [name], MD. Schedule an appointment as soon as possible for a visit in 1 week(s).   Specialty:  Endocrinology Contact information: [place]  [address] [phone]          No Known Allergies  Consultations:  Cardiology  Vascular surgery   Procedures/Studies: Dg Chest 2 View  Result Date: 12/19/2017 CLINICAL DATA:  Cough, shortness of breath EXAM: CHEST  2 VIEW COMPARISON:  CT chest dated 12/23/2016 FINDINGS: Chronic interstitial markings with mild subpleural reticulation/ fibrosis of the lung bases. No focal consolidation. No pleural effusion or pneumothorax. Mild eventration of the right posterior hemidiaphragm. Mild cardiomegaly. Postsurgical changes related to prior CABG. Prosthetic valve. Mild degenerative changes of the visualized thoracolumbar spine. Median sternotomy. IMPRESSION: No evidence of acute cardiopulmonary disease. Electronically Signed   By: Julian Hy M.D.   On: 12/19/2017 12:43   Ct Head Wo Contrast  Result Date: 12/19/2017 CLINICAL DATA:  Patient fell on Thursday hitting head. Abrasion to left forehead. EXAM: CT HEAD WITHOUT CONTRAST TECHNIQUE: Contiguous axial images were obtained from the base of the skull through the vertex without intravenous contrast. COMPARISON:  02/03/2008 FINDINGS: BRAIN: There is sulcal and ventricular prominence consistent with superficial and central atrophy. No intraparenchymal hemorrhage, mass effect nor midline shift. Periventricular and subcortical white matter hypodensities consistent with chronic small vessel ischemic disease are identified. No acute large vascular territory infarcts. No abnormal extra-axial fluid collections. Basal cisterns are not effaced and midline. VASCULAR: Mild bladder calcific atherosclerosis of the carotid siphons. No hyperdense vessels or unexpected calcifications. SKULL: No skull fracture. No significant scalp soft tissue swelling. SINUSES/ORBITS: The mastoid air-cells are clear. The included paranasal sinuses are well-aerated.The included ocular globes and orbital contents are non-suspicious. Bilateral lens replacements. OTHER: Mild left  supraorbital soft tissue swelling. IMPRESSION: Atrophy with chronic appearing small vessel ischemic disease. No acute intracranial abnormality. Mild left supraorbital soft swelling. Electronically Signed   By: Ashley Royalty M.D.   On: 12/19/2017 18:11     ECHOCARDIOGRAM  ------------------------------------------------------------------- Study Conclusions  - Left ventricle: The cavity size was mildly dilated. Wall thickness was normal. Systolic function was mildly reduced. The estimated ejection fraction was in the range of 45% to 50%. The study is not technically sufficient to allow evaluation of LV diastolic function. - Aortic valve: Normal functioning bio-prosthetic valve with no significant AR Valve area (Vmax): 2.01 cm^2. - Mitral valve: Severely calcified annulus. There was mild regurgitation. - Left atrium: The atrium was severely dilated. - Atrial septum: No defect or patent foramen ovale was identified.   CAROTID DUPLEX  Right Carotid: There is evidence in the right ICA of a 80-99% stenosis. Stenosis appears more severe than prior study 12/2016. Difficult exam due to tissue attenuation and poor windows.    Left Carotid: There is evidence in the left ICA of a 1-39% stenosis. Difficult exam due  to tissue attenuation and poor windows.        Subjective: No chest pain, palpitations or dyspnea.  Discharge Exam: Vitals:   12/22/17 1400 12/22/17 1500  BP: (!) 137/116 116/71  Pulse: 63 65  Resp: (!) 21 19  Temp: 98.4 F (36.9 C)   SpO2: 100% 95%   Vitals:   12/22/17 1352 12/22/17 1356 12/22/17 1400 12/22/17 1500  BP: 112/66  (!) 137/116 116/71  Pulse: 65  63 65  Resp:   (!) 21 19  Temp: 98.3 F (36.8 C)  98.4 F (36.9 C)   TempSrc: Oral  Oral   SpO2: 98% 99% 100% 95%  Weight:      Height:        General: Pt is alert, awake, not in acute distress Cardiovascular: Irregular rhythm with slow rate, S1/S2 +, no rubs, no gallops Respiratory: CTA  bilaterally, no wheezing, no rhonchi Abdominal: Soft, NT, ND, bowel sounds + Extremities: no edema, no cyanosis    The results of significant diagnostics from this hospitalization (including imaging, microbiology, ancillary and laboratory) are listed below for reference.     Microbiology: Recent Results (from the past 240 hour(s))  Respiratory Panel by PCR     Status: Abnormal   Collection Time: 12/19/17  3:24 PM  Result Value Ref Range Status   Adenovirus NOT DETECTED NOT DETECTED Final   Coronavirus 229E NOT DETECTED NOT DETECTED Final   Coronavirus HKU1 NOT DETECTED NOT DETECTED Final   Coronavirus NL63 NOT DETECTED NOT DETECTED Final   Coronavirus OC43 NOT DETECTED NOT DETECTED Final   Metapneumovirus DETECTED (A) NOT DETECTED Final   Rhinovirus / Enterovirus NOT DETECTED NOT DETECTED Final   Influenza A NOT DETECTED NOT DETECTED Final   Influenza B NOT DETECTED NOT DETECTED Final   Parainfluenza Virus 1 NOT DETECTED NOT DETECTED Final   Parainfluenza Virus 2 NOT DETECTED NOT DETECTED Final   Parainfluenza Virus 3 NOT DETECTED NOT DETECTED Final   Parainfluenza Virus 4 NOT DETECTED NOT DETECTED Final   Respiratory Syncytial Virus NOT DETECTED NOT DETECTED Final   Bordetella pertussis NOT DETECTED NOT DETECTED Final   Chlamydophila pneumoniae NOT DETECTED NOT DETECTED Final   Mycoplasma pneumoniae NOT DETECTED NOT DETECTED Final     Labs: BNP (last 3 results) Recent Labs    12/19/17 1219  BNP 073.7*   Basic Metabolic Panel: Recent Labs  Lab 12/19/17 1219 12/20/17 0304 12/21/17 0400 12/22/17 0210  NA 133* 134* 133* 134*  K 4.0 4.9 4.6 4.9  CL 100* 99* 97* 96*  CO2 24 27 28 28   GLUCOSE 140* 187* 219* 244*  BUN 16 16 32* 41*  CREATININE 0.92 0.86 1.13 1.19  CALCIUM 8.6* 8.5* 8.7* 8.7*  MG  --   --  2.0 2.1  PHOS  --   --  3.5 3.2   Liver Function Tests: Recent Labs  Lab 12/21/17 0400 12/22/17 0210  AST 26 27  ALT 22 24  ALKPHOS 67 78  BILITOT 1.2 0.8   PROT 6.1* 5.9*  ALBUMIN 3.0* 2.9*   No results for input(s): LIPASE, AMYLASE in the last 168 hours. No results for input(s): AMMONIA in the last 168 hours. CBC: Recent Labs  Lab 12/19/17 1219 12/20/17 0304 12/21/17 0400 12/22/17 0210  WBC 7.3 4.1 9.3 11.7*  NEUTROABS  --   --  8.1* 10.5*  HGB 12.3* 11.7* 11.5* 11.8*  HCT 39.0 37.1* 36.7* 37.3*  MCV 85.3 85.7 86.4 85.6  PLT 229 215  253 276   Cardiac Enzymes: Recent Labs  Lab 12/19/17 1541 12/19/17 2142 12/20/17 0304  TROPONINI <0.03 <0.03 <0.03   BNP: Invalid input(s): POCBNP CBG: Recent Labs  Lab 12/21/17 1625 12/21/17 1845 12/21/17 2041 12/22/17 0602 12/22/17 1332  GLUCAP 340* 357* 262* 216* 155*   D-Dimer No results for input(s): DDIMER in the last 72 hours. Hgb A1c No results for input(s): HGBA1C in the last 72 hours. Lipid Profile No results for input(s): CHOL, HDL, LDLCALC, TRIG, CHOLHDL, LDLDIRECT in the last 72 hours. Thyroid function studies Recent Labs    12/19/17 1600  TSH 0.633   Anemia work up No results for input(s): VITAMINB12, FOLATE, FERRITIN, TIBC, IRON, RETICCTPCT in the last 72 hours. Urinalysis    Component Value Date/Time   COLORURINE RED (A) 08/12/2017 2158   APPEARANCEUR CLOUDY (A) 08/12/2017 2158   LABSPEC 1.015 08/12/2017 2158   PHURINE 6.0 08/12/2017 2158   GLUCOSEU 50 (A) 08/12/2017 2158   HGBUR MODERATE (A) 08/12/2017 2158   BILIRUBINUR NEGATIVE 08/12/2017 2158   KETONESUR 5 (A) 08/12/2017 2158   PROTEINUR 100 (A) 08/12/2017 2158   UROBILINOGEN 0.2 04/12/2014 1244   NITRITE NEGATIVE 08/12/2017 2158   LEUKOCYTESUR NEGATIVE 08/12/2017 2158   Sepsis Labs Invalid input(s): PROCALCITONIN,  WBC,  LACTICIDVEN Microbiology Recent Results (from the past 240 hour(s))  Respiratory Panel by PCR     Status: Abnormal   Collection Time: 12/19/17  3:24 PM  Result Value Ref Range Status   Adenovirus NOT DETECTED NOT DETECTED Final   Coronavirus 229E NOT DETECTED NOT DETECTED  Final   Coronavirus HKU1 NOT DETECTED NOT DETECTED Final   Coronavirus NL63 NOT DETECTED NOT DETECTED Final   Coronavirus OC43 NOT DETECTED NOT DETECTED Final   Metapneumovirus DETECTED (A) NOT DETECTED Final   Rhinovirus / Enterovirus NOT DETECTED NOT DETECTED Final   Influenza A NOT DETECTED NOT DETECTED Final   Influenza B NOT DETECTED NOT DETECTED Final   Parainfluenza Virus 1 NOT DETECTED NOT DETECTED Final   Parainfluenza Virus 2 NOT DETECTED NOT DETECTED Final   Parainfluenza Virus 3 NOT DETECTED NOT DETECTED Final   Parainfluenza Virus 4 NOT DETECTED NOT DETECTED Final   Respiratory Syncytial Virus NOT DETECTED NOT DETECTED Final   Bordetella pertussis NOT DETECTED NOT DETECTED Final   Chlamydophila pneumoniae NOT DETECTED NOT DETECTED Final   Mycoplasma pneumoniae NOT DETECTED NOT DETECTED Final     SIGNED:   Cordelia Poche, MD [facility] 12/22/2017, 3:40 PM Pager [phone]  If 7PM-7AM, please contact night-coverage [email or web address] Password [email or web address]

## 2017-12-22 NOTE — Anesthesia Procedure Notes (Signed)
Procedure Name: General with mask airway Date/Time: 12/22/2017 12:07 PM Performed by: Leonor Liv, CRNA Oxygen Delivery Method: Ambu bag

## 2017-12-22 NOTE — Progress Notes (Addendum)
Advanced Heart Failure Rounding Note  PCP: Dr. Elyse Hsu Primary Cardiologist: Dr. Aundra Dubin  Subjective:     Still in A fib, HR 60-80's with pauses (2-2.5 sec). Dilt drip continues at 5 mg/hr. SBP 100's. No change in weight. + 200 mls, but pt had difficulty with the urinal.  No CP. Improved SOB. Walking to the bathroom and worked with PT yesterday. No dizziness or lightheadedness. Mouth is dry from being NPO for DCCV today.   Objective:   Weight Range: 185 lb 10 oz (84.2 kg) Body mass index is 26.26 kg/m.   Vital Signs:   Temp:  [97.5 F (36.4 C)-98.3 F (36.8 C)] 97.5 F (36.4 C) (01/09 0417) Pulse Rate:  [64-84] 64 (01/09 0417) Resp:  [19-30] 19 (01/09 0315) BP: (97-114)/(56-89) 111/56 (01/09 0417) SpO2:  [93 %-97 %] 94 % (01/09 0417) Weight:  [185 lb 10 oz (84.2 kg)] 185 lb 10 oz (84.2 kg) (01/09 0417) Last BM Date: 12/19/17(per pt self report)  Weight change: Filed Weights   12/20/17 0400 12/21/17 0414 12/22/17 0417  Weight: 185 lb 3 oz (84 kg) 185 lb (83.9 kg) 185 lb 10 oz (84.2 kg)    Intake/Output:   Intake/Output Summary (Last 24 hours) at 12/22/2017 5053 Last data filed at 12/22/2017 0600 Gross per 24 hour  Intake 1550.41 ml  Output 1300 ml  Net 250.41 ml      Physical Exam   General: Elderly. No resp difficulty. HEENT: Normal Neck: Supple. JVP 8. Carotids 2+ bilat; no bruits. No thyromegaly or nodule noted. Cor: PMI nondisplaced. No M/G/R noted. irregular Lungs: wheezing throughout Abdomen: Soft, non-tender, + distension, no HSM. No bruits or masses. +BS  Extremities: No cyanosis, clubbing, or rash. R and LLE no edema.  Neuro: Alert & orientedx3, cranial nerves grossly intact. moves all 4 extremities w/o difficulty. Affect pleasant   Telemetry   A fib 60-80's with 2-2.5 sec pauses and occasional PVC's. Personally reviewed.  EKG    No new tracings.  Labs    CBC Recent Labs    12/21/17 0400 12/22/17 0210  WBC 9.3 11.7*  NEUTROABS  8.1* 10.5*  HGB 11.5* 11.8*  HCT 36.7* 37.3*  MCV 86.4 85.6  PLT 253 976   Basic Metabolic Panel Recent Labs    12/21/17 0400 12/22/17 0210  NA 133* 134*  K 4.6 4.9  CL 97* 96*  CO2 28 28  GLUCOSE 219* 244*  BUN 32* 41*  CREATININE 1.13 1.19  CALCIUM 8.7* 8.7*  MG 2.0 2.1  PHOS 3.5 3.2   Liver Function Tests Recent Labs    12/21/17 0400 12/22/17 0210  AST 26 27  ALT 22 24  ALKPHOS 67 78  BILITOT 1.2 0.8  PROT 6.1* 5.9*  ALBUMIN 3.0* 2.9*   No results for input(s): LIPASE, AMYLASE in the last 72 hours. Cardiac Enzymes Recent Labs    12/19/17 1541 12/19/17 2142 12/20/17 0304  TROPONINI <0.03 <0.03 <0.03    BNP: BNP (last 3 results) Recent Labs    12/19/17 1219  BNP 264.6*    ProBNP (last 3 results) No results for input(s): PROBNP in the last 8760 hours.   D-Dimer No results for input(s): DDIMER in the last 72 hours. Hemoglobin A1C No results for input(s): HGBA1C in the last 72 hours. Fasting Lipid Panel No results for input(s): CHOL, HDL, LDLCALC, TRIG, CHOLHDL, LDLDIRECT in the last 72 hours. Thyroid Function Tests Recent Labs    12/19/17 1600  TSH 0.633  Other results:   Imaging    No results found.   Medications:     Scheduled Medications: . allopurinol  300 mg Oral Daily  . atorvastatin  10 mg Oral QODAY  . ezetimibe  10 mg Oral Daily  . finasteride  5 mg Oral Daily  . furosemide  40 mg Intravenous BID  . Influenza vac split quadrivalent PF  0.5 mL Intramuscular Tomorrow-1000  . insulin aspart  0-15 Units Subcutaneous TID WC  . insulin aspart  0-5 Units Subcutaneous QHS  . methylPREDNISolone (SOLU-MEDROL) injection  40 mg Intravenous Q24H  . pneumococcal 23 valent vaccine  0.5 mL Intramuscular Tomorrow-1000  . sodium chloride flush  3 mL Intravenous Q12H  . spironolactone  12.5 mg Oral Daily  . tamsulosin  0.4 mg Oral Daily  . timolol  1 drop Both Eyes BID  . Warfarin - Pharmacist Dosing Inpatient   Does not apply  q1800    Infusions: . sodium chloride Stopped (12/21/17 1900)  . diltiazem (CARDIZEM) infusion 5 mg/hr (12/22/17 0600)    PRN Medications: acetaminophen, ondansetron (ZOFRAN) IV, sodium chloride flush    Patient Profile   Nathaniel [name] is a 84 y.o. male with a medial history of A fib on coumadin (NSR since DCCV 08/2014), CAD s/p CABG (1989) and redo CABG (2007), AAA s/p repair (2015), CHF, DM, PAD, carotid stenosis, and bioprothestic AVR (2007).  He was admitted on 1/6 with worsening SOB and found to be in A fib RVR.  Assessment/Plan   1. A fib with RVR - Continue Cardizem drip @ 5 mg/hr - Rate remains controlled on cardizem - Had hallucinations with amio and fatigue with beta blockers in the past. - TSH normal - On coumadin. INR 2.9 this am. Dosing per pharmacy. - INR has been therapeutic since 11/11/17. Plan for DCCV today. - If unsuccessful or recurrence of a fib after DCCV, will likely start tikosyn. K-4.9, M-2.1 this am. - Likely that a fib was triggered by respiratory viral infection.   2. Cough/Respiratory infection/Bronchitis - RSV + for metapneumovirus. Continue droplet precautions - Being treated with solumedrol.  - Has completed coarse of antibiotics and tamiflu outpatient - Cough improved, but still wheezy - WBC up today 11.7. Afebrile.  3. Acute on Chronic Systolic and Diastolic Heart Failure, Suspected ICM, EF 45-50% Echo 12/2014 EF 55-60% with grade II DD; Echo 01/2017 EF 45-50% with grade I DD LHC 06/2014: LIMA-LAD patent with 40-50% stenosis in LAD after touchdown, sequential SVG-ramus/OM/diagonal with only the ramus branch still intact (known from prior cath), SVG-PDA from original surgery TO, SVG-PDA from redo surgery with long 50-60% mid-graft stenosis. No target for intervention.  - Echo 12/21/2017: EF 45-50%, mild MR with severely calcified annulus, severely dilated LA - NYHA class III, Volume status okay today. - Takes 40/20 alternating Lasix at home.  Continue 40 mg IV lasix BID. Creatinine 0.86 > 1.13 > 1.19. Watch renal function closely. May need to hold diuretics if creatinine continues to increase - Continue spiro 12.5 mg daily. - Ramipril on hold. SBP's 100s with dilt drip. May add back after DCCV depending on BP. - BB previously DC'd due to fatigue - BNP 264 on admission, no comparison - Has difficulty with using urinal, so ?output accuracy  4. CAD s/p CABG and redo CABG with AAA repair - On coumadin with stable CAD so no ASA per Dr. Claris Gladden note - On atorvastatin and zetia. - No s/s ischemia  5. AVR - bioprosthetic  -  Stable on echo 02/03/2017 - Normal functioning on echo 12/21/2017  6. Carotid Stenosis - Hx of moderate RICA stenosis.  - No stroke-like symptoms - Carotid dopplers showed 80-99% stenosis of R ICA. Left ICA 1-39% stenosis.  - Vascular consulted for right CEA. Will need anticoagulation after DCCV x1 month, then could hold warfarin for surgery.  7. Mechanical Falls - Head CT negative for acute abnormality - No dizziness or lightheadedness prior to falls - PT recommends [facility] PT  Medication concerns reviewed with patient and pharmacy team. Barriers identified: No. He is very functional and manages his own medications. He does not have trouble getting any meds. He drives himself to appointments.  Length of Stay: [place], NP  12/22/2017, 7:12 AM  Advanced Heart Failure Team Pager [phone] (M-F; [place])  Please contact [facility] for night-coverage after hours (4p -7a ) and weekends on [email or web address]  Patient seen with NP, agree with the above note.   On exam, volume status looks better.  He still has end expiratory wheezes on exam.   I took him for DCCV this morning, this was successful and he is back in NSR.    1. Acute hypoxemic respiratory failure: Improved now, off oxygen. Suspect acute bronchitis from metapneumovirus with triggering of atrial fibrillation/RVR and acute on chronic diastolic  CHF.  Suspect wheezing on exam related to bronchitis.  - Treated with oseltamivir and abx as outpatient.  2. Acute on chronic diastolic CHF: Echo this admission stable with EF 45-50%, ischemic cardiomyopathy. Takes Lasix 40 alternating with 20 at home. I/Os not recorded fully but he is breathing better and volume status looks better on exam.   - Stop IV Lasix, will start Lasix 40 mg po daily tomorrow.  - Stop diltiazem gtt, will start Toprol XL 25 mg daily.  - Will leave off ramipril for now with soft BP.  - Continue spironolactone 12.5 mg daily.  3. Atrial fibrillation: Persistent, with RVR. Suspect this was triggered by viral syndrome. The last time he was cardioverted was in 2015. Suspect afib/RVR has triggered CHF exacerbation. He was successfully cardioverted back to NSR.  - Continue warfarin.  - Stop diltiazem and start Toprol XL 25 mg daily.  - If he goes back to afib quickly, would consider Tikosyn use (QTc ok for this).  4. CAD: s/p CABG. No chest pain. Continue statin, Zetia.  5. Carotid stenosis: Severe RICA stenosis on right.  He is asymptomatic.  VVS has seen.  Will need right CEA. After DCCV, will need to be anticoagulated x 1 month without interrupting.  At that point, could hold warfarin for CEA.   From a cardiac standpoint, he could probably go home later today.  However, I am not against watching him another day for viral syndrome/wheezing if felt necessary.  I will need to see him back in 2 weeks and he will need BMET in 1 week.  Cardiac meds for home: Lasix 40 mg daily alternating with 20 mg daily, Toprol XL 25 mg daily, spironolactone 12.5 daily, warfarin, atorvastatin 10 daily, Zetia 10 daily.  Would hold off on ramipril for now.   [name] 12/22/2017 12:32 PM

## 2017-12-23 ENCOUNTER — Ambulatory Visit: Payer: Medicare Other | Admitting: Acute Care

## 2017-12-23 ENCOUNTER — Encounter (HOSPITAL_COMMUNITY): Payer: Self-pay | Admitting: Cardiology

## 2017-12-24 ENCOUNTER — Telehealth: Payer: Self-pay | Admitting: Acute Care

## 2017-12-24 DIAGNOSIS — R911 Solitary pulmonary nodule: Secondary | ICD-10-CM

## 2017-12-24 NOTE — Telephone Encounter (Signed)
Per our protocol, CT order has been placed under SG as JN is no longer here.

## 2017-12-27 ENCOUNTER — Other Ambulatory Visit: Payer: Self-pay | Admitting: *Deleted

## 2017-12-27 NOTE — Patient Outreach (Signed)
[facility] [facility]) Care Management  12/27/2017  Nathaniel [name] Aug 16, 1932 [id number]  EMMI- Heart Failure RED ON EMMI ALERT DAY#:2 DATE: 12/25/17 RED ALERT: Weighed themselves today? No   Outreach attempt # 1 to patient. No answer. Unable to leave a voicemail.  Plan: RN CM will contact patient within one week.   [name], RN, BSN, MHA/MSL, [place] Telephonic Care Manager Coordinator [facility] Direct Phone: [phone] Cell Phone: [phone] Toll Free: [phone] Fax: [phone]

## 2017-12-28 ENCOUNTER — Ambulatory Visit: Payer: Self-pay | Admitting: *Deleted

## 2017-12-28 ENCOUNTER — Telehealth: Payer: Self-pay

## 2017-12-28 ENCOUNTER — Other Ambulatory Visit: Payer: Self-pay | Admitting: *Deleted

## 2017-12-28 NOTE — Telephone Encounter (Signed)
[facility] outreach RN called our office states she saw pt for hospital follow-up and since hospital d/c pt has been holding his Coumadin.  It appears when pt was admitted on 12/19/17 INR 5.16 and Coumadin was held while pt was in the hospital, but per hospital discharge pt was to be on Coumadin, INR at discharge was 2.9. Outreach RN states pt has not been taking Coumadin, but she advised pt to resume and requested [place] call pt to reiterate those instructions.  Called pt, pt states he resumed his Coumadin at his previous dosage regimen yesterday 12/27/17.  Advised pt to keep scheduled follow-up appt in [facility]madin Clinic on 01/06/18.  Pt verbalized understanding.  Pt states he was on an abx and steroids at hospital admission (which is probably why INR was 5.16), but he is not on any abx or steroids at the present.

## 2017-12-28 NOTE — Patient Outreach (Signed)
[place] [facility]) Care Management  12/28/2017  ALBERTO PINA [birth year] [id number]   EMMI- Heart Failure RED ON EMMI ALERT DAY#:2 DATE: 12/25/17 RED ALERT: Weighed themselves today? No  Telephone outreach to patient. Patient stated, he was unable to talk in the beginning of the telephone call. He was in the process of arranging his appointments arranged by Cardiology and Pulmonology. RN CM explained to patient, she could assist him with sorting out his appointments and patient agreed. HIPAA identifiers verified X's 2. RN CM gave patient a list of appointments showing in EMR, including dates, times, and addresses. Patient was thankful for assisting him with organizing his appointments.   Patient acknowledged being in the hospital due to having a cough, sore throat, possible flu, and Atrial Fibrillation (AF). Patient stated, he had a Cardioversion during his hospital stay. Patient confirmed he didn't weigh himself due to forgetting to weigh. He stated, "I am not used to weighing every day. I weigh myself here and there". Patient said, his weight is stable and being monitored by his doctors. Home health was recommended for patient and he declined services. Patient plans to stop taking his Metoprolol due to symptoms of lethargy and having difficulty getting out of bed. Patient stated, "He doesn't care what the MD says and he will stop taking the medicine". RN CM explained to patient the purpose of Metoprolol. RN CM asked patient if he monitored his blood pressure and heart rate and he stated no. RN CM explained to patient how Metoprolol can affect the blood pressure and heart rate, which can cause his symptoms. RN CM encouraged patient to speak with MD before stopping the medication. Patient reported, he has a sister and a sister-n-law with AF.  Patient feels his family is doing fine being in AF. Patient asked if he should be taking his Coumadin after discharging from the hospital. RN CM reviewed  patient's discharge hospital instructions. RN CM informed patient that the hospital discharge instruction states Coumadin should be resumed. RN CM was in the process of explaining how medication should be taken and patient interrupted. He stated how the Coumadin was prescribed according to his discharge instructions. Patient verbalized taking medications for his Prostate. Per patient, he was hospitalized 4 times due to pain in his Prostate and he wants to avoid being hospitalized in the near future. Per EMR, patient will have a right Endarterectomy in one month after being on the Coumadin for a full month. Patient verbalized being aware of this procedure in the future. [facility] services and benefits explained to patient. Patient stated, "He has too many doctors involved in his care. He can't handle anyone else being involved at this time". He stated, "Cardiology will be arranging all of his appointments and if they don't he will go without".   Update: RN CM spoke with Susie at the [facility]. RN CM explained to Susie patient is not familial with his heart failure diagnosis. He is not weighing daily and he doesn't plan to weigh daily. RN CM explained to Susie, patient needs educations and he declined [facility] services.   Update: RN CM spoke with Doroteo Bradford at the [facility]in Clinic regarding Coumadin. Doroteo Bradford confirmed patient continuing Coumadin as scheduled per hospital discharge instructions. RN CM asked Doroteo Bradford to contact patient to confirm continuing Coumadin per MD orders. Doroteo Bradford agreed to contact patient.  Update: RN CM contacted Pulmonary Clinic to get clarification on address for patient's scheduled CT scan on 01/05/18.   Update:  RN CM  contacted patient and provided him with updated address for CT scan.  Plan: RM CM will send successful outreach letter to patient. RN CM will notify [facility] Case Management Assistant regarding case closure.  RN CM advised patient to contact RNCM for any needs or concerns. RN  CM advised patient to alert MD for any changes in conditions.  RN CM will send patient EMMI educational materials.  [name], RN, BSN, MHA/MSL, [place] Telephonic Care Manager Coordinator [facility] Direct Phone: [phone] Cell Phone: [phone] Toll Free: [phone] Fax: [phone]

## 2017-12-29 ENCOUNTER — Encounter: Payer: Self-pay | Admitting: *Deleted

## 2017-12-30 ENCOUNTER — Ambulatory Visit (HOSPITAL_COMMUNITY)
Admit: 2017-12-30 | Discharge: 2017-12-30 | Disposition: A | Discharge status: Home / Self Care | Payer: Medicare Other | Attending: Cardiology | Admitting: Cardiology

## 2017-12-30 ENCOUNTER — Telehealth (HOSPITAL_COMMUNITY): Payer: Self-pay | Admitting: Cardiology

## 2017-12-30 ENCOUNTER — Encounter (HOSPITAL_COMMUNITY): Payer: Self-pay | Admitting: Cardiology

## 2017-12-30 VITALS — BP 140/62 | HR 84 | Wt 182.8 lb

## 2017-12-30 DIAGNOSIS — I2510 Atherosclerotic heart disease of native coronary artery without angina pectoris: Secondary | ICD-10-CM | POA: Insufficient documentation

## 2017-12-30 DIAGNOSIS — E785 Hyperlipidemia, unspecified: Secondary | ICD-10-CM | POA: Diagnosis not present

## 2017-12-30 DIAGNOSIS — I480 Paroxysmal atrial fibrillation: Secondary | ICD-10-CM | POA: Diagnosis not present

## 2017-12-30 DIAGNOSIS — Z953 Presence of xenogenic heart valve: Secondary | ICD-10-CM | POA: Diagnosis not present

## 2017-12-30 DIAGNOSIS — N400 Enlarged prostate without lower urinary tract symptoms: Secondary | ICD-10-CM | POA: Insufficient documentation

## 2017-12-30 DIAGNOSIS — I110 Hypertensive heart disease with heart failure: Secondary | ICD-10-CM | POA: Diagnosis not present

## 2017-12-30 DIAGNOSIS — I5022 Chronic systolic (congestive) heart failure: Secondary | ICD-10-CM

## 2017-12-30 DIAGNOSIS — Z79899 Other long term (current) drug therapy: Secondary | ICD-10-CM | POA: Insufficient documentation

## 2017-12-30 DIAGNOSIS — Z951 Presence of aortocoronary bypass graft: Secondary | ICD-10-CM | POA: Insufficient documentation

## 2017-12-30 DIAGNOSIS — I6529 Occlusion and stenosis of unspecified carotid artery: Secondary | ICD-10-CM | POA: Insufficient documentation

## 2017-12-30 DIAGNOSIS — K219 Gastro-esophageal reflux disease without esophagitis: Secondary | ICD-10-CM | POA: Insufficient documentation

## 2017-12-30 DIAGNOSIS — I25810 Atherosclerosis of coronary artery bypass graft(s) without angina pectoris: Secondary | ICD-10-CM

## 2017-12-30 DIAGNOSIS — Z7901 Long term (current) use of anticoagulants: Secondary | ICD-10-CM | POA: Insufficient documentation

## 2017-12-30 DIAGNOSIS — I6523 Occlusion and stenosis of bilateral carotid arteries: Secondary | ICD-10-CM | POA: Diagnosis not present

## 2017-12-30 DIAGNOSIS — E119 Type 2 diabetes mellitus without complications: Secondary | ICD-10-CM | POA: Diagnosis not present

## 2017-12-30 DIAGNOSIS — I714 Abdominal aortic aneurysm, without rupture: Secondary | ICD-10-CM | POA: Diagnosis not present

## 2017-12-30 DIAGNOSIS — M109 Gout, unspecified: Secondary | ICD-10-CM | POA: Diagnosis not present

## 2017-12-30 DIAGNOSIS — I4891 Unspecified atrial fibrillation: Secondary | ICD-10-CM

## 2017-12-30 LAB — LIPID PANEL
CHOLESTEROL: 236 mg/dL — AB (ref 0–200)
HDL: 56 mg/dL (ref >40)
LDL Cholesterol: 146 mg/dL — ABNORMAL HIGH (ref 0–99)
Total CHOL/HDL Ratio: 4.2 RATIO
Triglycerides: 172 mg/dL — ABNORMAL HIGH (ref <150)
VLDL: 34 mg/dL (ref 0–40)

## 2017-12-30 LAB — BASIC METABOLIC PANEL
Anion gap: 10 (ref 5–15)
BUN: 10 mg/dL (ref 6–20)
CHLORIDE: 102 mmol/L (ref 101–111)
CO2: 27 mmol/L (ref 22–32)
CREATININE: 1.01 mg/dL (ref 0.61–1.24)
Calcium: 9.1 mg/dL (ref 8.9–10.3)
GFR calc Af Amer: >60 mL/min (ref >60)
GFR calc non Af Amer: >60 mL/min (ref >60)
GLUCOSE: 120 mg/dL — AB (ref 65–99)
Potassium: 4.7 mmol/L (ref 3.5–5.1)
SODIUM: 139 mmol/L (ref 135–145)

## 2017-12-30 MED ORDER — ATORVASTATIN CALCIUM 40 MG PO TABS: 40.0000 mg | ORAL_TABLET | ORAL | 11 refills | Status: DC

## 2017-12-30 MED ORDER — RAMIPRIL 5 MG PO CAPS: 5.0000 mg | ORAL_CAPSULE | Freq: Every day | ORAL | 3 refills | Status: DC

## 2017-12-30 NOTE — Progress Notes (Addendum)
Patient ID: Nathaniel [name], male   DOB: 1932-02-04, 84 y.o.   MRN: [id number] PCP: Dr. Elyse [name] Cardiology: Dr. Aundra [name]  84 y.o. with history of extensive vascular disease including CAD s/p CABG and redo CABG, AAA s/p repair, PAD, and carotid stenosis as well as prior AVR presents for cardiology followup.   He has had documentation of significant peripheral arterial disease.  He gets bilateral calf soreness after walking for about 15 minutes.  This resolves with rest, no rest pain.  No pedal ulcers, though when he gets cuts on his feet, they heal slowly.  Peripheral arterial dopplers done in 10/14 showed > 50% focal SFA stenoses bilaterally.  He has at least moderate right common carotid stenosis that may be nearing surgical range.  No stroke-like symptoms.  He had a recent endovascular AAA repair in 5/15.    After his AAA repair, he was noted to be in atrial fibrillation.  He had had a prior episode of atrial fibrillation after his cardiac surgery in 2007.  Atrial fibrillation was rate-controlled.  He was started on warfarin and Toprol XL.  He developed exertional dyspnea and fatigue in atrial fibrillation. I cardioverted him back to NSR on 06/06/14.  I also had him get an echocardiogram in 6/15.  This showed EF worsened to 25-30% with stable bioprosthetic aortic valve, mildly dilated RV with normal systolic function.  Given the fall in EF, I took him for Nathaniel [name] in 7/15.  This showed stable anatomy.  The LIMA-LAD was patent, the sequential SVG-OM/ramus/D was patent only to ramus but this is the same as the prior cath, and the SVG-PDA was patent with a long 50-60% mid-graft stenosis.  No intervention.  Of note, at cath he was noted to be back in atrial fibrillation.  I then started him on amiodarone.  I took Mr Nathaniel [name] for Nathaniel [name] again on 08/21/14.  This was successful and he remains in NSR today.  He has been off amiodarone due to side effects (hallucinations).  Echo in 1/16 showed recovery of EF to 55-60%.    He was  admitted in 1/19 with bronchitis/wheezing/dyspnea and was found to be in atrial fibrillation with RVR.  He treated positive for metapneumovirus.  Echo showed EF 45-50% with normal bioprosthetic aortic valve. He was diuresed for volume overload while in the [name]. He had DCCV to NSR.  Carotid dopplers in 1/19 showed 80-99% RICA stenosis.   He returns for followup of CHF and atrial fibrillation.  He is in NSR today.  He is feeling considerably better.  Walking for 10 minutes/day now without dyspnea on flat ground.  No orthopnea/PND.  No chest pain. No claudication.  Weight down 10 lbs compared to a year ago. No lightheadedness/falls.    ECG: NSR, inferior TW flattening.   Labs (5/15): K 4.3, creatinine 0.89 Labs (6/15): K 4.3, creatinine 1.4, BNP 112 Labs (7/15): K 4.4, creatinine 1.2, HCT 40.2 Labs (8/15): K 4, creatinine 1.2, BNP 113, LFTs and TSH normal Labs (10/15): K 4.4, creatinine 1.4 Labs (5/16): K 4.7, creatinine 1.18, HDL 52, LDL 98 Labs (6/16): LDL 118, LFTs normal Labs (1/17): HCT 40.2, K 3.7, creatinine 1.04 Labs (1/19): K 4.9, creatinine 1.19  PMH: 1. PAD: Peripheral arterial dopplers in 2012 showed > 75% bilateral SFA stenosis. Peripheral arterial dopplers in 10/14 showed > 50% focal bilateral SFA stenoses.  2. AAA: [place] 4/15 with 4.4 cm AAA but concern for penetrating ulcers. CTA abdomen showed 4.4 cm AAA with penetrating ulcers and possible  pseudoaneurysms. Now s/p endovascular repair of AAA in 4/23 with no complications.  3. Carotid stenosis: Carotid dopplers (4/15) with 60-79% bilateral ICA stenosis. Carotid dopplers (11/15) with 60-79% bilateral ICA stenosis.  Carotid dopplers 6/16 with 60-79% bilateral ICA stenosis. - Carotid dopplers (1/18): 60-79% RICA stenosis.  - Carotid dopplers (1/19): 80-99% RICA stenosis 4. CAD: CABG 1989 with LIMA-LAD, SVG-D, seq SVG-ramus and OM, sequential SVG-PDA/PLV. SVG-PDA/PLV found to be occluded on cath prior to AVR, so patient had SVG-PDA  with AVR in 2007. LHC (7/15) with LIMA-LAD patent with 40-50% stenosis in LAD after touchdown, sequential SVG-ramus/OM/diagonal with only the ramus branch still intact (known from prior cath), SVG-PDA from original surgery TO, SVG-PDA from redo surgery with long 50-60% mid-graft stenosis.  No target for intervention.  5. Severe aortic stenosis: Bioprosthetic AVR in 2007. Echo (11/10) with EF 60-65%, basal to mid inferior hypokinesis, bioprosthetic aortic valve without significant stenosis/regurgitation, mild MR. Echo (11/14) with EF 55-60%, mild LVH, bioprosthetic aortic valve looked ok. Bioprosthetic valve looked ok on 6/15 echo.   6. Atrial fibrillation: Paroxysmal. Initially noted after cardiac surgery, then again after AAA repair.  DCCV to NSR 06/06/14.  DCCV to NSR again 08/21/14.  - DCCV to NSR in 1/19.  7. Type II diabetes  8. Gout  9. GERD  10. Hyperlipidemia  11. HTN 12. GERD  13. Chronic systolic CHF: Echo (5/36) with EF worsened to 25-30% with grade II diastolic dysfunction, stable bioprosthetic aortic valve, mildly dilated RV with normal systolic function.  Echo (1/16) with EF 55-60%, basal inferior hypokinesis, mild LVH, bioprosthetic aortic valve with mean gradient 13 mmHg, mild MR, severe LAE.  - Echo (1/19): EF 45-50%, mild MR, normal bioprosthetic aortic valve.  29. BPH   SH: Widower, 2 children, raised his 3 grand-daughters, lives in [place], [place].  Occasional ETOH, no smoking.   FH: Father died from ruptured AAA.   ROS: All systems reviewed and negative except as per HPI.   Current Outpatient Medications  Medication Sig Dispense Refill  . allopurinol (ZYLOPRIM) 300 MG tablet Take 300 mg by mouth daily.   2  . Coenzyme Q10 (CO Q10 PO) Take 1 capsule by mouth daily.    Marland Kitchen doxazosin (CARDURA) 1 MG tablet Take 1 mg by mouth in the evening    . doxazosin (CARDURA) 2 MG tablet Take 2 mg by mouth every morning.    . ezetimibe (ZETIA) 10 MG tablet Take 10 mg by mouth daily.      . finasteride (PROSCAR) 5 MG tablet Take 5 mg by mouth daily.    . furosemide (LASIX) 20 MG tablet Take 20mg  tablet every other day alternating with 40mg  tablet every other day    . furosemide (LASIX) 40 MG tablet TAKE ONE TABLET ALTERNATING WITH 20MG  INTHE MORNING 45 tablet 6  . glucosamine-chondroitin 500-400 MG tablet Take 1 tablet by mouth daily.    Marland Kitchen ipratropium-albuterol (DUONEB) 0.5-2.5 (3) MG/3ML SOLN Take 3 mLs by nebulization every 6 (six) hours. 360 mL 0  . latanoprost (XALATAN) 0.005 % ophthalmic solution Instill 1 drop into the affected eye at bedtime    . metoprolol succinate (TOPROL-XL) 25 MG 24 hr tablet Take 1 tablet (25 mg total) by mouth daily. 30 tablet 0  . Multiple Vitamins-Minerals (OCUVITE PO) Take 1 tablet by mouth daily.    . Omega-3 Fatty Acids (OMEGA-3 PO) Take 1 capsule by mouth daily.    . sildenafil (VIAGRA) 50 MG tablet Take 50 mg by mouth daily as  needed for erectile dysfunction.     . sitaGLIPtin (JANUVIA) 100 MG tablet Take 100 mg by mouth every other day.    . spironolactone (ALDACTONE) 25 MG tablet Take 0.5 tablets (12.5 mg total) by mouth daily. 30 tablet 6  . tamsulosin (FLOMAX) 0.4 MG CAPS capsule Take 0.4 mg by mouth daily.    . timolol (TIMOPTIC) 0.5 % ophthalmic solution Place 1 drop into the right eye daily.   5  . warfarin (COUMADIN) 3 MG tablet Take 2 tablets (6 mg) by mouth on Sun, Tues, Thurs and Sat and Take 3 tablets (9 mg) by mouth on MWF (Patient taking differently: Take 6-9 mg by mouth See admin instructions. 6 mg at bedtime on Sun/Tues/Thurs/Sat and 9 mg on Mon/Wed/Fri) 70 tablet 1  . atorvastatin (LIPITOR) 40 MG tablet Take 1 tablet (40 mg total) by mouth every other day. 30 tablet 11  . ramipril (ALTACE) 5 MG capsule Take 1 capsule (5 mg total) by mouth daily. 90 capsule 3   No current facility-administered medications for this encounter.     BP 140/62   Pulse 84   Wt 182 lb 12 oz (82.9 kg)   SpO2 99%   BMI 25.85 kg/m   General:  NAD Neck: No JVD, no thyromegaly or thyroid nodule.  Lungs: Clear to auscultation bilaterally with normal respiratory effort. CV: Nondisplaced PMI.  Heart regular S1/S2, no S3/S4, 2/6 early SEM RUSB.  No peripheral edema.  Soft right carotid bruit.  Difficult to palpate pedal pulses.  Abdomen: Soft, nontender, no hepatosplenomegaly, no distention.  Skin: Intact without lesions or rashes.  Neurologic: Alert and oriented x 3.  Psych: Normal affect. Extremities: No clubbing or cyanosis.  HEENT: Normal.   Assessment/Plan:  1. CAD: s/p CABG and redo CABG.  Given fall in EF, I did a cardiac cath in 7/15 as documented above. Unfortunately, despite significant coronary disease, there were no good interventional targets.  No chest pain.  - Continue statin.  - He is on warfarin in setting of stable CAD so not on ASA 81.  2. Hyperlipidemia: He is taking atorvastatin + Zetia.  Will check lipids today.    3. PAD: Patient denies claudication or foot ulcers. Peripheral arterial dopplers showed > 50% bilateral focal SFA stenoses in the past.  He is not a cilostazol candidate with CHF. He is followed by Dr. Trula Slade at [facility].  4. Carotid stenosis: Severe RICA stenosis.  He has had no stroke-like symptoms. He should have CEA, but will need to wait until > 1 month post-DCCV before holding anticoagulation for surgery.  - He has an appt later this month with Dr. Trula Slade at [facility].  5. AAA: Successful endovascular repair in 5/15.  6. Bioprosthetic AVR: Valve stable on 1/19 echo.  7. Atrial fibrillation: Atrial fibrillation RVR likely triggered by bronchitis in 1/19.  He developed CHF in the setting of atrial fibrillation.  He had successful DCCV and is in NSR today.  He is now off amiodarone given possible CNS side effects.  Tikosyn would be an option if he has quick recurrence.  8. Chronic primarily diastolic CHF: Suspect ischemic cardiomyopathy.  EF 25-30% in 6/15 but had normalized to 55-60% by 1/16.  Down to 45-50% on  1/19 echo but was in the setting of atrial fibrillation.  NYHA class II symptoms now, stable.  He is not volume overloaded on exam.  - Continue Lasix 40 mg daily alternating with 20 mg daily.   BMET today. - Continue  Toprol XL 25 mg daily.  - Start back on ramipril 5 mg daily (BP borderline elevated).  - Continue spironolactone 12.5 daily.  9. HTN: BP borderline high, restart ramipril 5 mg daily as above.  10. Diabetes: Given CHF with tendency towards fluid retention, empagliflozin may be a better choice for DM treatment than sitagliptin.   Followup in 2 months.    [name] 12/30/2017

## 2017-12-30 NOTE — Patient Instructions (Signed)
Labs today (will call for abnormal results, otherwise no news is good news)  START taking Ramipril 5 mg Once Daily  Labs in 2 weeks (bmet)  Follow up in 2 Months

## 2017-12-30 NOTE — Telephone Encounter (Signed)
-----   Message from Larey Dresser, MD sent at 12/30/2017  2:35 PM EST ----- Lipids way above goal, need LDL < 70.  Increase atorvastatin to 40 mg daily and repeat lipids/LFTs in 2 months.

## 2017-12-30 NOTE — Telephone Encounter (Signed)
Patient aware. And voiced Understanding

## 2018-01-05 ENCOUNTER — Ambulatory Visit (INDEPENDENT_AMBULATORY_CARE_PROVIDER_SITE_OTHER)
Admission: RE | Admit: 2018-01-05 | Discharge: 2018-01-05 | Disposition: A | Discharge status: Home / Self Care | Payer: Medicare Other | Source: Ambulatory Visit | Attending: Acute Care | Admitting: Acute Care

## 2018-01-05 DIAGNOSIS — R918 Other nonspecific abnormal finding of lung field: Secondary | ICD-10-CM | POA: Diagnosis not present

## 2018-01-05 DIAGNOSIS — R911 Solitary pulmonary nodule: Secondary | ICD-10-CM | POA: Diagnosis not present

## 2018-01-06 ENCOUNTER — Ambulatory Visit (INDEPENDENT_AMBULATORY_CARE_PROVIDER_SITE_OTHER): Payer: Medicare Other | Admitting: Acute Care

## 2018-01-06 ENCOUNTER — Telehealth: Payer: Self-pay | Admitting: Acute Care

## 2018-01-06 ENCOUNTER — Encounter: Payer: Self-pay | Admitting: Acute Care

## 2018-01-06 ENCOUNTER — Ambulatory Visit (INDEPENDENT_AMBULATORY_CARE_PROVIDER_SITE_OTHER): Payer: Medicare Other | Admitting: *Deleted

## 2018-01-06 ENCOUNTER — Ambulatory Visit: Payer: Medicare Other | Admitting: Acute Care

## 2018-01-06 DIAGNOSIS — R911 Solitary pulmonary nodule: Secondary | ICD-10-CM

## 2018-01-06 DIAGNOSIS — I6523 Occlusion and stenosis of bilateral carotid arteries: Secondary | ICD-10-CM

## 2018-01-06 DIAGNOSIS — R918 Other nonspecific abnormal finding of lung field: Secondary | ICD-10-CM | POA: Diagnosis not present

## 2018-01-06 DIAGNOSIS — I4891 Unspecified atrial fibrillation: Secondary | ICD-10-CM | POA: Diagnosis not present

## 2018-01-06 DIAGNOSIS — J211 Acute bronchiolitis due to human metapneumovirus: Secondary | ICD-10-CM | POA: Diagnosis not present

## 2018-01-06 DIAGNOSIS — Z5181 Encounter for therapeutic drug level monitoring: Secondary | ICD-10-CM | POA: Diagnosis not present

## 2018-01-06 LAB — POCT INR: INR: 1.5

## 2018-01-06 MED ORDER — DOXYCYCLINE HYCLATE 100 MG PO TABS: 100.0000 mg | ORAL_TABLET | Freq: Two times a day (BID) | ORAL | 0 refills | Status: DC

## 2018-01-06 NOTE — Progress Notes (Signed)
History of Present Illness Nathaniel [name] is a 84 y.o. male former smoker , Quit 1993, bio-prosthetic valve aortic valve with pulmonary nodules on CT.  He was followed by Dr. Ashok Cordia  HPI Lung Nodules: 7 mm nodule right upper lobe, 4 mm nodule left lower lobe on recent CT scan this month. Right upper lobe nodule has not appreciably changed in size since January 2017. Denies any chest pain or pressure.   01/06/2018 Follow Up after CT Chest: Pt. Presents for follow up of pulmonary nodules. Last seen in the pulmonary office 06/2016 by Dr. Ashok Cordia.  He was recently admitted to the hospital for atrial fibrillation with RVR, Acute on chronic combined systolic/diastolic HF,and Meta pneumovirus infection. He was treated with Tamiflu,IV steroids and Duoneb. He had a short course of Levaquin that was discontinued as soon as viral panel was noted.He was diuresed for his heart failure. He states he is recovered from his admission. He has not needed any DuoNebs and he denies any shortness of breath. He states he is not coughing anything up. He denies fever, chest pian, orthopnea or hemoptysis.  Pt. States 3 years ago the patient swallowed a crown during a dental procedure. He had an x-ray and the crown was in his lung. It was removed, but the CXR is showed the lung nodules as an incidental finding.   He presents today to review the results of his most recent CT Chest as follow up for nodules..We reviewed the results.He understands that we will do a follow up scan in 12 months.   Test Results: CT Chest 01/05/2018>> IMPRESSION: 1. New mild patchy tree-in-bud opacity in the dependent left lower lobe, compatible with a nonspecific mild infectious or inflammatory bronchiolitis, with the differential including mild aspiration. 2. Ill-defined subsolid 6 mm apical right upper lobe pulmonary nodule is stable for 2 years and probably postinflammatory. Annual noncontrast chest CT follow-up is recommended until 5 years  of stability has been established.   12/19/2017>> CXR IMPRESSION: No evidence of acute cardiopulmonary disease.  12/2017>>EF: 45-50% Left ventricle: The cavity size was mildly dilated. Wall   thickness was normal. Systolic function was mildly reduced. The   estimated ejection fraction was in the range of 45% to 50%. The   study is not technically sufficient to allow evaluation of LV   diastolic function. - Aortic valve: Normal functioning bio-prosthetic valve with no   significant AR Valve area (Vmax): 2.01 cm^2. - Mitral valve: Severely calcified annulus. There was mild   regurgitation. - Left atrium: The atrium was severely dilated. - Atrial septum: No defect or patent foramen ovale was identified.  CBC Latest Ref Rng & Units 12/22/2017 12/21/2017 12/20/2017  WBC 4.0 - 10.5 K/uL 11.7(H) 9.3 4.1  Hemoglobin 13.0 - 17.0 g/dL 11.8(L) 11.5(L) 11.7(L)  Hematocrit 39.0 - 52.0 % 37.3(L) 36.7(L) 37.1(L)  Platelets 150 - 400 K/uL 276 253 215    BMP Latest Ref Rng & Units 12/30/2017 12/22/2017 12/21/2017  Glucose 65 - 99 mg/dL 120(H) 244(H) 219(H)  BUN 6 - 20 mg/dL 10 41(H) 32(H)  Creatinine 0.61 - 1.24 mg/dL 1.01 1.19 1.13  Sodium 135 - 145 mmol/L 139 134(L) 133(L)  Potassium 3.5 - 5.1 mmol/L 4.7 4.9 4.6  Chloride 101 - 111 mmol/L 102 96(L) 97(L)  CO2 22 - 32 mmol/L 27 28 28   Calcium 8.9 - 10.3 mg/dL 9.1 8.7(L) 8.7(L)    BNP    Component Value Date/Time   BNP 264.6 (H) 12/19/2017 1219  ProBNP    Component Value Date/Time   PROBNP 57.0 11/22/2014 1050    PFT No results found for: FEV1PRE, FEV1POST, FVCPRE, FVCPOST, TLC, DLCOUNC, PREFEV1FVCRT, PSTFEV1FVCRT  Dg Chest 2 View  Result Date: 12/19/2017 CLINICAL DATA:  Cough, shortness of breath EXAM: CHEST  2 VIEW COMPARISON:  CT chest dated 12/23/2016 FINDINGS: Chronic interstitial markings with mild subpleural reticulation/ fibrosis of the lung bases. No focal consolidation. No pleural effusion or pneumothorax. Mild eventration of the  right posterior hemidiaphragm. Mild cardiomegaly. Postsurgical changes related to prior CABG. Prosthetic valve. Mild degenerative changes of the visualized thoracolumbar spine. Median sternotomy. IMPRESSION: No evidence of acute cardiopulmonary disease. Electronically Signed   By: Julian Hy M.D.   On: 12/19/2017 12:43   Ct Head Wo Contrast  Result Date: 12/19/2017 CLINICAL DATA:  Patient fell on Thursday hitting head. Abrasion to left forehead. EXAM: CT HEAD WITHOUT CONTRAST TECHNIQUE: Contiguous axial images were obtained from the base of the skull through the vertex without intravenous contrast. COMPARISON:  02/03/2008 FINDINGS: BRAIN: There is sulcal and ventricular prominence consistent with superficial and central atrophy. No intraparenchymal hemorrhage, mass effect nor midline shift. Periventricular and subcortical white matter hypodensities consistent with chronic small vessel ischemic disease are identified. No acute large vascular territory infarcts. No abnormal extra-axial fluid collections. Basal cisterns are not effaced and midline. VASCULAR: Mild bladder calcific atherosclerosis of the carotid siphons. No hyperdense vessels or unexpected calcifications. SKULL: No skull fracture. No significant scalp soft tissue swelling. SINUSES/ORBITS: The mastoid air-cells are clear. The included paranasal sinuses are well-aerated.The included ocular globes and orbital contents are non-suspicious. Bilateral lens replacements. OTHER: Mild left supraorbital soft tissue swelling. IMPRESSION: Atrophy with chronic appearing small vessel ischemic disease. No acute intracranial abnormality. Mild left supraorbital soft swelling. Electronically Signed   By: Ashley Royalty M.D.   On: 12/19/2017 18:11   Ct Chest Wo Contrast  Result Date: 01/05/2018 CLINICAL DATA:  Follow-up pulmonary nodule. EXAM: CT CHEST WITHOUT CONTRAST TECHNIQUE: Multidetector CT imaging of the chest was performed following the standard protocol  without IV contrast. COMPARISON:  12/19/2017 chest radiograph.  12/23/2016 chest CT. FINDINGS: Cardiovascular: Normal heart size. No significant pericardial fluid/thickening. Left main and 3 vessel coronary atherosclerosis status post CABG. Aortic valve prosthesis is in place. Atherosclerotic nonaneurysmal thoracic aorta. Normal caliber pulmonary arteries. Mediastinum/Nodes: No discrete thyroid nodules. Unremarkable esophagus. No pathologically enlarged axillary, mediastinal or gross hilar lymph nodes, noting limited sensitivity for the detection of hilar adenopathy on this noncontrast study. Lungs/Pleura: No pneumothorax. No pleural effusion. Left lower lobe 4 mm solid pulmonary nodule (series 3/image 118) is stable since 12/24/2015 chest CT, considered benign. Ill-defined 6 mm subsolid nodule in the apical right upper lobe (series 3/image 32), unchanged since 12/24/2015 chest CT. No acute consolidative airspace disease, lung masses or new significant pulmonary nodules. New mild patchy tree-in-bud opacity in the dependent left lower lobe (series 3/image 99). Nonspecific patchy subpleural reticulation at the lung bases is unchanged. Upper abdomen: No acute abnormality. Musculoskeletal: No aggressive appearing focal osseous lesions. Intact sternotomy wires. Marked thoracic spondylosis. IMPRESSION: 1. New mild patchy tree-in-bud opacity in the dependent left lower lobe, compatible with a nonspecific mild infectious or inflammatory bronchiolitis, with the differential including mild aspiration. 2. Ill-defined subsolid 6 mm apical right upper lobe pulmonary nodule is stable for 2 years and probably postinflammatory. Annual noncontrast chest CT follow-up is recommended until 5 years of stability has been established. This recommendation follows the consensus statement: Guidelines for Management of Incidental Pulmonary Nodules  Detected on CT Images: From the Fleischner Society 2017; Radiology 2017; [phone]. Aortic  Atherosclerosis (ICD10-I70.0). Electronically Signed   By: Ilona Sorrel M.D.   On: 01/05/2018 15:34     Past medical hx Past Medical History:  Diagnosis Date  . Atrial fibrillation ([place])   . Atrial fibrillation ([place])   . CAD (coronary artery disease)   . Cataract   . CHF (congestive heart failure) ([place])   . Colon polyps    adenomatous  . Diabetes mellitus   . Diverticulosis   . GERD (gastroesophageal reflux disease)   . Gout   . Hernia, abdominal   . Hx: UTI (urinary tract infection)   . Hyperlipemia   . Hypertension   . Internal hemorrhoids   . Peripheral vascular disease ([place])   . Shortness of breath      Social History   Tobacco Use  . Smoking status: Former Smoker    Packs/day: 0.25    Years: 20.00    Pack years: 5.00    Types: Cigarettes    Last attempt to quit: 03/23/1992    Years since quitting: 25.8  . Smokeless tobacco: Never Used  Substance Use Topics  . Alcohol use: Yes    Alcohol/week: 6.0 oz    Types: 10 Glasses of wine per week    Comment: 1-2 drinks daily   . Drug use: No    Mr.Lanahan reports that he quit smoking about 25 years ago. His smoking use included cigarettes. He has a 5.00 pack-year smoking history. he has never used smokeless tobacco. He reports that he drinks about 6.0 oz of alcohol per week. He reports that he does not use drugs.  Tobacco Cessation: Former smoker quit 1993  Past surgical hx, Family hx, Social hx all reviewed.  Current Outpatient Medications on File Prior to Visit  Medication Sig  . allopurinol (ZYLOPRIM) 300 MG tablet Take 300 mg by mouth daily.   Marland Kitchen atorvastatin (LIPITOR) 40 MG tablet Take 1 tablet (40 mg total) by mouth every other day.  . Coenzyme Q10 (CO Q10 PO) Take 1 capsule by mouth daily.  Marland Kitchen doxazosin (CARDURA) 1 MG tablet Take 1 mg by mouth in the evening  . doxazosin (CARDURA) 2 MG tablet Take 2 mg by mouth every morning.  . ezetimibe (ZETIA) 10 MG tablet Take 10 mg by mouth daily.   . finasteride  (PROSCAR) 5 MG tablet Take 5 mg by mouth daily.  . furosemide (LASIX) 20 MG tablet Take 20mg  tablet every other day alternating with 40mg  tablet every other day  . furosemide (LASIX) 40 MG tablet TAKE ONE TABLET ALTERNATING WITH 20MG  INTHE MORNING  . glucosamine-chondroitin 500-400 MG tablet Take 1 tablet by mouth daily.  Marland Kitchen latanoprost (XALATAN) 0.005 % ophthalmic solution Instill 1 drop into the affected eye at bedtime  . metoprolol succinate (TOPROL-XL) 25 MG 24 hr tablet Take 1 tablet (25 mg total) by mouth daily.  . Multiple Vitamins-Minerals (OCUVITE PO) Take 1 tablet by mouth daily.  . Omega-3 Fatty Acids (OMEGA-3 PO) Take 1 capsule by mouth daily.  . ramipril (ALTACE) 5 MG capsule Take 1 capsule (5 mg total) by mouth daily.  . sildenafil (VIAGRA) 50 MG tablet Take 50 mg by mouth daily as needed for erectile dysfunction.   . sitaGLIPtin (JANUVIA) 100 MG tablet Take 100 mg by mouth every other day.  . spironolactone (ALDACTONE) 25 MG tablet Take 0.5 tablets (12.5 mg total) by mouth daily.  . tamsulosin (FLOMAX) 0.4 MG CAPS  capsule Take 0.4 mg by mouth daily.  . timolol (TIMOPTIC) 0.5 % ophthalmic solution Place 1 drop into the right eye daily.   Marland Kitchen warfarin (COUMADIN) 3 MG tablet Take 2 tablets (6 mg) by mouth on Sun, Tues, Thurs and Sat and Take 3 tablets (9 mg) by mouth on MWF (Patient taking differently: Take 6-9 mg by mouth See admin instructions. 6 mg at bedtime on Sun/Tues/Thurs/Sat and 9 mg on Mon/Wed/Fri)  . ipratropium-albuterol (DUONEB) 0.5-2.5 (3) MG/3ML SOLN Take 3 mLs by nebulization every 6 (six) hours. (Patient not taking: Reported on 01/06/2018)   No current facility-administered medications on file prior to visit.      No Known Allergies  Review Of Systems:  Constitutional:   No  weight loss, night sweats,  Fevers, chills, fatigue, or  lassitude.  HEENT:   No headaches,  Difficulty swallowing,  Tooth/dental problems, or  Sore throat,                No sneezing, itching,  ear ache, nasal congestion, post nasal drip,   CV:  No chest pain,  Orthopnea, PND, swelling in lower extremities, anasarca, dizziness, palpitations, syncope.   GI  No heartburn, indigestion, abdominal pain, nausea, vomiting, diarrhea, change in bowel habits, loss of appetite, bloody stools.   Resp: No shortness of breath with exertion or at rest.  No excess mucus, no productive cough,  No non-productive cough,  No coughing up of blood.  No change in color of mucus.  No wheezing.  No chest wall deformity  Skin: no rash or lesions.  GU: no dysuria, change in color of urine, no urgency or frequency.  No flank pain, no hematuria   MS:  No joint pain or swelling.  No decreased range of motion.  No back pain.  Psych:  No change in mood or affect. No depression or anxiety.  No memory loss.   Vital Signs BP 140/60 (BP Location: Right Arm, Cuff Size: Normal)   Pulse 61   Ht 5\' 10"  (1.778 m)   Wt 187 lb 6.4 oz (85 kg)   SpO2 98%   BMI 26.89 kg/m    Physical Exam:  General- No distress,  A&Ox3, pleasant ENT: No sinus tenderness, TM clear, pale nasal mucosa, no oral exudate,no post nasal drip, no LAN Cardiac: S1, S2, regular rate and rhythm, no murmur Chest: No wheeze/ rales/ dullness; no accessory muscle use, no nasal flaring, no sternal retractions, diminished per bases Abd.: Soft Non-tender Ext: No clubbing cyanosis, edema Neuro:  normal strength, MAE x 4, A&O x 3 Skin: No rashes, warm and dry, intact Psych: normal mood and behavior   Assessment/Plan  Pulmonary nodule CT Chest shows stable nodule Plan: Follow up CT chest without contrast 12/2018 Follow up with Dr. Lamonte Sakai after Ct to review results  Lung infiltrate Per CT Chest 01/05/2018 New mild patchy tree-in-bud opacity in the dependent left lower lobe, compatible with a nonspecific mild infectious or inflammatory bronchiolitis, with the differential including mild aspiration. Pt. Denies getting choked on food or any  difficulty swallowing  Plan: Doxycycline 100 mg BID x 7 days Follow up as needed   Acute bronchiolitis due to human metapneumovirus Requiring hospitalization 12/2017 Resolved Plan: Doxycycline 100 mg BID x 7 days Follow up as needed.    Magdalen Spatz, NP 01/06/2018  9:55 AM

## 2018-01-06 NOTE — Patient Instructions (Addendum)
It is good to see you today We will schedule a CT Chest without contrast 12/2018. Doxycycline 100 twice daily x 7 days for new infiltrate Follow up with Dr. Lamonte Sakai after CT scan in 12/2018. Follow up before if needed We will call the Coumadin clinic and let them know you have been started on Doxy x 7 days. Please contact office for sooner follow up if symptoms do not improve or worsen or seek emergency care

## 2018-01-06 NOTE — Assessment & Plan Note (Signed)
Requiring hospitalization 12/2017 Resolved Plan: Doxycycline 100 mg BID x 7 days Follow up as needed.

## 2018-01-06 NOTE — Assessment & Plan Note (Signed)
CT Chest shows stable nodule Plan: Follow up CT chest without contrast 12/2018 Follow up with Dr. Lamonte Sakai after Ct to review results

## 2018-01-06 NOTE — Patient Instructions (Addendum)
Description   Today take 2.5 tablets and tomorrow 3.5 tablets then continue taking 2 tablets everyday except 3 tablets on Mondays, Wednesdays and Fridays. Recheck in 2 weeks per pt request.   Call us with any new medications or concerns, Cou[facility]in Clinic # [phone], Main # [phone].

## 2018-01-06 NOTE — Assessment & Plan Note (Addendum)
Per CT Chest 01/05/2018 New mild patchy tree-in-bud opacity in the dependent left lower lobe, compatible with a nonspecific mild infectious or inflammatory bronchiolitis, with the differential including mild aspiration. Pt. Denies getting choked on food or any difficulty swallowing  Plan: Doxycycline 100 mg BID x 7 days Follow up as needed

## 2018-01-06 NOTE — Telephone Encounter (Signed)
Called and spoke with Nathaniel [name]---she will follow up with the pt to see about getting him in the office to check his protime since he will be on the doxycycline.  Nothing further is needed.   Will forward to SG as FYI

## 2018-01-10 ENCOUNTER — Encounter: Payer: Self-pay | Admitting: Surgery

## 2018-01-10 ENCOUNTER — Other Ambulatory Visit: Payer: Self-pay | Admitting: *Deleted

## 2018-01-10 ENCOUNTER — Ambulatory Visit (INDEPENDENT_AMBULATORY_CARE_PROVIDER_SITE_OTHER): Payer: Medicare Other | Admitting: Surgery

## 2018-01-10 ENCOUNTER — Encounter: Payer: Self-pay | Admitting: *Deleted

## 2018-01-10 VITALS — BP 150/67 | HR 69 | Temp 97.0°F | Resp 16 | Ht 70.0 in | Wt 187.0 lb

## 2018-01-10 DIAGNOSIS — I6523 Occlusion and stenosis of bilateral carotid arteries: Secondary | ICD-10-CM | POA: Diagnosis not present

## 2018-01-10 NOTE — H&P (View-Only) (Signed)
[facility] [place]  Patient name: Nathaniel [name] MRN: [id number] DOB: [birth year] Sex: male   REASON FOR VISIT:    Carotid stenosis  HISOTRY OF PRESENT ILLNESS:    Nathaniel [name] is a 84 y.o. male who comes in today for follow-up.  He is status post endovascular repair of a abdominal aortic aneurysm.  He was recently admitted to the hospital with atrial fibrillation with RVR.  He underwent cardioversion.  During the stay he was found to have 80-99% right carotid stenosis.  He denies any carotid symptoms.  Specifically, he denies numbness or weakness in either extremity.  He denies slurred speech.  He denies amaurosis fugax.  The patient is on Coumadin for his atrial fibrillation.  He has been in normal sinus rhythm and feeling better.   PAST MEDICAL HISTORY:   Past Medical History:  Diagnosis Date  . Atrial fibrillation ([place])   . Atrial fibrillation ([place])   . CAD (coronary artery disease)   . Cataract   . CHF (congestive heart failure) ([place])   . Colon polyps    adenomatous  . Diabetes mellitus   . Diverticulosis   . GERD (gastroesophageal reflux disease)   . Gout   . Hernia, abdominal   . Hx: UTI (urinary tract infection)   . Hyperlipemia   . Hypertension   . Internal hemorrhoids   . Peripheral vascular disease ([place])   . Shortness of breath      FAMILY HISTORY:   Family History  Problem Relation Age of Onset  . Heart disease Mother   . Heart disease Father        before age 61  . AAA (abdominal aortic aneurysm) Father   . Colon cancer Neg Hx     SOCIAL HISTORY:   Social History   Tobacco Use  . Smoking status: Former Smoker    Packs/day: 0.25    Years: 20.00    Pack years: 5.00    Types: Cigarettes    Last attempt to quit: 03/23/1992    Years since quitting: 25.8  . Smokeless tobacco: Never Used  Substance Use Topics  . Alcohol use: Yes    Alcohol/week: 6.0 oz    Types: 10 Glasses of wine per  week    Comment: 1-2 drinks daily      ALLERGIES:   Allergies  Allergen Reactions  . Milk-Related Compounds     Just Yogurt.  Makes him very lethargic.     CURRENT MEDICATIONS:   Current Outpatient Medications  Medication Sig Dispense Refill  . allopurinol (ZYLOPRIM) 300 MG tablet Take 300 mg by mouth daily.   2  . atorvastatin (LIPITOR) 40 MG tablet Take 1 tablet (40 mg total) by mouth every other day. 30 tablet 11  . Coenzyme Q10 (CO Q10 PO) Take 1 capsule by mouth daily.    Marland Kitchen doxazosin (CARDURA) 1 MG tablet Take 1 mg by mouth in the evening    . doxazosin (CARDURA) 2 MG tablet Take 2 mg by mouth every morning.    . ezetimibe (ZETIA) 10 MG tablet Take 10 mg by mouth daily.     . finasteride (PROSCAR) 5 MG tablet Take 5 mg by mouth daily.    . furosemide (LASIX) 20 MG tablet Take 20mg  tablet every other day alternating with 40mg  tablet every other day    . furosemide (LASIX) 40 MG tablet TAKE ONE TABLET ALTERNATING WITH 20MG  INTHE MORNING 45 tablet 6  . glucosamine-chondroitin 500-400  MG tablet Take 1 tablet by mouth daily.    Marland Kitchen latanoprost (XALATAN) 0.005 % ophthalmic solution Instill 1 drop into the affected eye at bedtime    . Multiple Vitamins-Minerals (OCUVITE PO) Take 1 tablet by mouth daily.    . Omega-3 Fatty Acids (OMEGA-3 PO) Take 1 capsule by mouth daily.    . ramipril (ALTACE) 5 MG capsule Take 1 capsule (5 mg total) by mouth daily. 90 capsule 3  . sildenafil (VIAGRA) 50 MG tablet Take 50 mg by mouth daily as needed for erectile dysfunction.     . sitaGLIPtin (JANUVIA) 100 MG tablet Take 100 mg by mouth every other day.    . spironolactone (ALDACTONE) 25 MG tablet Take 0.5 tablets (12.5 mg total) by mouth daily. 30 tablet 6  . tamsulosin (FLOMAX) 0.4 MG CAPS capsule Take 0.4 mg by mouth daily.    . timolol (TIMOPTIC) 0.5 % ophthalmic solution Place 1 drop into the right eye daily.   5  . warfarin (COUMADIN) 3 MG tablet Take 2 tablets (6 mg) by mouth on Sun, Tues,  Thurs and Sat and Take 3 tablets (9 mg) by mouth on MWF (Patient taking differently: Take 6-9 mg by mouth See admin instructions. 6 mg at bedtime on Sun/Tues/Thurs/Sat and 9 mg on Mon/Wed/Fri) 70 tablet 1  . doxycycline (VIBRA-TABS) 100 MG tablet Take 1 tablet (100 mg total) by mouth 2 (two) times daily. (Patient not taking: Reported on 01/10/2018) 14 tablet 0  . ipratropium-albuterol (DUONEB) 0.5-2.5 (3) MG/3ML SOLN Take 3 mLs by nebulization every 6 (six) hours. (Patient not taking: Reported on 01/10/2018) 360 mL 0  . metoprolol succinate (TOPROL-XL) 25 MG 24 hr tablet Take 1 tablet (25 mg total) by mouth daily. (Patient not taking: Reported on 01/10/2018) 30 tablet 0   No current facility-administered medications for this visit.     REVIEW OF SYSTEMS:   [X]  denotes positive finding, [ ]  denotes negative finding Cardiac  Comments:  Chest pain or chest pressure:    Shortness of breath upon exertion: x   Short of breath when lying flat:    Irregular heart rhythm: x       Vascular    Pain in calf, thigh, or hip brought on by ambulation:    Pain in feet at night that wakes you up from your sleep:     Blood clot in your veins:    Leg swelling:         Pulmonary    Oxygen at home:    Productive cough:     Wheezing:         Neurologic    Sudden weakness in arms or legs:     Sudden numbness in arms or legs:     Sudden onset of difficulty speaking or slurred speech:    Temporary loss of vision in one eye:     Problems with dizziness:         Gastrointestinal    Blood in stool:     Vomited blood:         Genitourinary    Burning when urinating:     Blood in urine:        Psychiatric    Major depression:         Hematologic    Bleeding problems:    Problems with blood clotting too easily:        Skin    Rashes or ulcers:        Constitutional  Fever or chills:      PHYSICAL EXAM:   Vitals:   01/10/18 1010 01/10/18 1014  BP: (!) 148/76 (!) 150/67  Pulse: 69   Resp:  16   Temp: (!) 97 F (36.1 C)   TempSrc: Oral   SpO2: 95%   Weight: 187 lb (84.8 kg)   Height: 5\' 10"  (1.778 m)     GENERAL: The patient is a well-nourished male, in no acute distress. The vital signs are documented above. CARDIAC: There is a regular rate and rhythm.  PULMONARY: Non-labored respirations MUSCULOSKELETAL: There are no major deformities or cyanosis. NEUROLOGIC: No focal weakness or paresthesias are detected. SKIN: There are no ulcers or rashes noted. PSYCHIATRIC: The patient has a normal affect.  STUDIES:   I have reviewed his carotid duplex which shows 80-99% right carotid stenosis  MEDICAL ISSUES:   Asymptomatic right carotid stenosis: We discussed proceeding with right carotid endarterectomy.  I discussed the risks and benefits of the operation including the risk of stroke, nerve injury, and cardiopulmonary complications.  I will stop his Coumadin 4 days prior to his operation.  While off Coumadin, he will resume 81 mg aspirin.  His operation has been scheduled for Wednesday, February 20.    Annamarie Major, MD [facility] [phone] Pager [phone]

## 2018-01-10 NOTE — Progress Notes (Signed)
Vascular and Vein Specialist of [place]  Patient name: Nathaniel [name] MRN: [id number] DOB: [birth year] Sex: male   REASON FOR VISIT:    Carotid stenosis  HISOTRY OF PRESENT ILLNESS:    Nathaniel [name] is a 84 y.o. male who comes in today for follow-up.  He is status post endovascular repair of a abdominal aortic aneurysm.  He was recently admitted to the hospital with atrial fibrillation with RVR.  He underwent cardioversion.  During the stay he was found to have 80-99% right carotid stenosis.  He denies any carotid symptoms.  Specifically, he denies numbness or weakness in either extremity.  He denies slurred speech.  He denies amaurosis fugax.  The patient is on Coumadin for his atrial fibrillation.  He has been in normal sinus rhythm and feeling better.   PAST MEDICAL HISTORY:   Past Medical History:  Diagnosis Date  . Atrial fibrillation ([place])   . Atrial fibrillation ([place])   . CAD (coronary artery disease)   . Cataract   . CHF (congestive heart failure) ([place])   . Colon polyps    adenomatous  . Diabetes mellitus   . Diverticulosis   . GERD (gastroesophageal reflux disease)   . Gout   . Hernia, abdominal   . Hx: UTI (urinary tract infection)   . Hyperlipemia   . Hypertension   . Internal hemorrhoids   . Peripheral vascular disease ([place])   . Shortness of breath      FAMILY HISTORY:   Family History  Problem Relation Age of Onset  . Heart disease Mother   . Heart disease Father        before age 64  . AAA (abdominal aortic aneurysm) Father   . Colon cancer Neg Hx     SOCIAL HISTORY:   Social History   Tobacco Use  . Smoking status: Former Smoker    Packs/day: 0.25    Years: 20.00    Pack years: 5.00    Types: Cigarettes    Last attempt to quit: 03/23/1992    Years since quitting: 25.8  . Smokeless tobacco: Never Used  Substance Use Topics  . Alcohol use: Yes    Alcohol/week: 6.0 oz    Types: 10 Glasses of wine per  week    Comment: 1-2 drinks daily      ALLERGIES:   Allergies  Allergen Reactions  . Milk-Related Compounds     Just Yogurt.  Makes him very lethargic.     CURRENT MEDICATIONS:   Current Outpatient Medications  Medication Sig Dispense Refill  . allopurinol (ZYLOPRIM) 300 MG tablet Take 300 mg by mouth daily.   2  . atorvastatin (LIPITOR) 40 MG tablet Take 1 tablet (40 mg total) by mouth every other day. 30 tablet 11  . Coenzyme Q10 (CO Q10 PO) Take 1 capsule by mouth daily.    Marland Kitchen doxazosin (CARDURA) 1 MG tablet Take 1 mg by mouth in the evening    . doxazosin (CARDURA) 2 MG tablet Take 2 mg by mouth every morning.    . ezetimibe (ZETIA) 10 MG tablet Take 10 mg by mouth daily.     . finasteride (PROSCAR) 5 MG tablet Take 5 mg by mouth daily.    . furosemide (LASIX) 20 MG tablet Take 20mg  tablet every other day alternating with 40mg  tablet every other day    . furosemide (LASIX) 40 MG tablet TAKE ONE TABLET ALTERNATING WITH 20MG  INTHE MORNING 45 tablet 6  . glucosamine-chondroitin 500-400  MG tablet Take 1 tablet by mouth daily.    Marland Kitchen latanoprost (XALATAN) 0.005 % ophthalmic solution Instill 1 drop into the affected eye at bedtime    . Multiple Vitamins-Minerals (OCUVITE PO) Take 1 tablet by mouth daily.    . Omega-3 Fatty Acids (OMEGA-3 PO) Take 1 capsule by mouth daily.    . ramipril (ALTACE) 5 MG capsule Take 1 capsule (5 mg total) by mouth daily. 90 capsule 3  . sildenafil (VIAGRA) 50 MG tablet Take 50 mg by mouth daily as needed for erectile dysfunction.     . sitaGLIPtin (JANUVIA) 100 MG tablet Take 100 mg by mouth every other day.    . spironolactone (ALDACTONE) 25 MG tablet Take 0.5 tablets (12.5 mg total) by mouth daily. 30 tablet 6  . tamsulosin (FLOMAX) 0.4 MG CAPS capsule Take 0.4 mg by mouth daily.    . timolol (TIMOPTIC) 0.5 % ophthalmic solution Place 1 drop into the right eye daily.   5  . warfarin (COUMADIN) 3 MG tablet Take 2 tablets (6 mg) by mouth on Sun, Tues,  Thurs and Sat and Take 3 tablets (9 mg) by mouth on MWF (Patient taking differently: Take 6-9 mg by mouth See admin instructions. 6 mg at bedtime on Sun/Tues/Thurs/Sat and 9 mg on Mon/Wed/Fri) 70 tablet 1  . doxycycline (VIBRA-TABS) 100 MG tablet Take 1 tablet (100 mg total) by mouth 2 (two) times daily. (Patient not taking: Reported on 01/10/2018) 14 tablet 0  . ipratropium-albuterol (DUONEB) 0.5-2.5 (3) MG/3ML SOLN Take 3 mLs by nebulization every 6 (six) hours. (Patient not taking: Reported on 01/10/2018) 360 mL 0  . metoprolol succinate (TOPROL-XL) 25 MG 24 hr tablet Take 1 tablet (25 mg total) by mouth daily. (Patient not taking: Reported on 01/10/2018) 30 tablet 0   No current facility-administered medications for this visit.     REVIEW OF SYSTEMS:   [X]  denotes positive finding, [ ]  denotes negative finding Cardiac  Comments:  Chest pain or chest pressure:    Shortness of breath upon exertion: x   Short of breath when lying flat:    Irregular heart rhythm: x       Vascular    Pain in calf, thigh, or hip brought on by ambulation:    Pain in feet at night that wakes you up from your sleep:     Blood clot in your veins:    Leg swelling:         Pulmonary    Oxygen at home:    Productive cough:     Wheezing:         Neurologic    Sudden weakness in arms or legs:     Sudden numbness in arms or legs:     Sudden onset of difficulty speaking or slurred speech:    Temporary loss of vision in one eye:     Problems with dizziness:         Gastrointestinal    Blood in stool:     Vomited blood:         Genitourinary    Burning when urinating:     Blood in urine:        Psychiatric    Major depression:         Hematologic    Bleeding problems:    Problems with blood clotting too easily:        Skin    Rashes or ulcers:        Constitutional  Fever or chills:      PHYSICAL EXAM:   Vitals:   01/10/18 1010 01/10/18 1014  BP: (!) 148/76 (!) 150/67  Pulse: 69   Resp:  16   Temp: (!) 97 F (36.1 C)   TempSrc: Oral   SpO2: 95%   Weight: 187 lb (84.8 kg)   Height: 5\' 10"  (1.778 m)     GENERAL: The patient is a well-nourished male, in no acute distress. The vital signs are documented above. CARDIAC: There is a regular rate and rhythm.  PULMONARY: Non-labored respirations MUSCULOSKELETAL: There are no major deformities or cyanosis. NEUROLOGIC: No focal weakness or paresthesias are detected. SKIN: There are no ulcers or rashes noted. PSYCHIATRIC: The patient has a normal affect.  STUDIES:   I have reviewed his carotid duplex which shows 80-99% right carotid stenosis  MEDICAL ISSUES:   Asymptomatic right carotid stenosis: We discussed proceeding with right carotid endarterectomy.  I discussed the risks and benefits of the operation including the risk of stroke, nerve injury, and cardiopulmonary complications.  I will stop his Coumadin 4 days prior to his operation.  While off Coumadin, he will resume 81 mg aspirin.  His operation has been scheduled for Wednesday, February 20.    Annamarie Major, MD Vascular and Vein Specialists of [facility] [phone] Pager [phone]

## 2018-01-11 ENCOUNTER — Telehealth: Payer: Self-pay | Admitting: *Deleted

## 2018-01-11 NOTE — Telephone Encounter (Signed)
-----   Message from Larey Dresser, MD sent at 01/10/2018  9:47 PM EST ----- Regarding: RE: Coumadin Clearance That will be > 1 month post cardioversion (12/22/17), so should be ok to hold warfarin.   ----- Message ----- From: Willy Eddy, RN Sent: 01/10/2018  11:31 AM To: Larey Dresser, MD Subject: Coumadin Clearance                             Dr. Trula Slade has scheduled right carotid endarterectomy on 02/02/18. Instructed patient to hold Coumadin 5 days pre-op and start ASA 81 mg 3 days pre-op. PLEASE ADVISE if  Different treatment needed or if this is approved by you. Thank you, Archivist

## 2018-01-13 ENCOUNTER — Other Ambulatory Visit (HOSPITAL_COMMUNITY): Payer: Medicare Other

## 2018-01-17 ENCOUNTER — Other Ambulatory Visit: Payer: Self-pay | Admitting: *Deleted

## 2018-01-18 ENCOUNTER — Other Ambulatory Visit: Payer: Self-pay | Admitting: *Deleted

## 2018-01-18 NOTE — Patient Outreach (Addendum)
[facility]) Care Management  01/17/2018  BOBY EYER [birth year] [id number]  EMMI- Heart Failure RED ON EMMI ALERT DAY#: 22 DATE: 01/13/18 RED ALERT: Weighed themselves? No   Outreach attempt #1 to patient. Patient was unavailable.  Plan: RN CM will contact patient within one week.    [name], RN, BSN, MHA/MSL, [place] Telephonic Care Manager Coordinator [facility] Direct Phone: [phone] Toll Free: [phone] Fax: [phone]

## 2018-01-18 NOTE — Patient Outreach (Signed)
Nathaniel [name]) Care Management  01/18/2018  Nathaniel [name] Jun 13, 1932 [id number]  EMMI- Heart Failure RED ON EMMI ALERT DAY#: 22 DATE: 01/14/18 RED ALERT: Weighed themselves today? No  Outreach attempt # 1 to patient. HIPAA identifiers X's 2 verified. Patient reported, his weight today is 186 pounds. He stated, he was not as concerned with his weight. Patient stated, "He lost 14 pounds and if he gained 2 pounds, it wouldn't be a big deal". RN CM encouraged patient to be mindful of symptoms, if weight wasn't a concern for him. Patient stated, he hasn't experienced any shortness of breath. Patient verbalized, he is doing well. He is walking for exercise due to the weather. He knows to contact the MD for any concerns. Patient reported, he continues to take his medications as prescribed. He has a pre-admission assessment scheduled on 01/28/18 in preparation for an Endarterectomy on 02/02/18. He stated, "If anything is wrong, they should find it during the pre-assessment".   Advised patient that he may continue to get automated post discharge calls to assess how he is doing following recent hospitalization. Inform patient, he will receive a call from a nurse if any of his responses were abnormal. Patient voiced understanding.  Plan: RN CM will notify [name] Case Management Assistant regarding case closure.   Nathaniel Bells, RN, BSN, MHA/MSL, [place] Telephonic Care Manager Coordinator [facility] Direct Phone: [phone] Toll Free: [phone] Fax: [phone]

## 2018-01-25 ENCOUNTER — Other Ambulatory Visit: Payer: Self-pay | Admitting: *Deleted

## 2018-01-25 NOTE — Patient Outreach (Signed)
[place] [facility]) Care Management  01/25/2018  LORENA CLEARMAN Jun 30, 1932 [id number]  EMMI- Heart Failure RED ON EMMI ALERT DAY#: 32 DATE: 01/24/18 RED ALERT: Weighed themselves today? No    Outreach attempt #1 to patient. Patient requested a return phone call at a later time. Patient was unavailable. He was on a conference call.   Plan: RN CM will contact patient within one week.    [name], RN, BSN, MHA/MSL, [place] Telephonic Care Manager Coordinator [facility] Direct Phone: [phone] Toll Free: [phone] Fax: [phone]

## 2018-01-26 ENCOUNTER — Ambulatory Visit: Payer: Self-pay | Admitting: *Deleted

## 2018-01-27 ENCOUNTER — Other Ambulatory Visit: Payer: Self-pay | Admitting: *Deleted

## 2018-01-28 ENCOUNTER — Other Ambulatory Visit: Payer: Self-pay

## 2018-01-28 ENCOUNTER — Encounter (HOSPITAL_COMMUNITY)
Admission: RE | Admit: 2018-01-28 | Discharge: 2018-01-28 | Disposition: A | Discharge status: Home / Self Care | Payer: Medicare Other | Source: Ambulatory Visit | Attending: Surgery | Admitting: Surgery

## 2018-01-28 ENCOUNTER — Encounter (HOSPITAL_COMMUNITY): Payer: Self-pay

## 2018-01-28 ENCOUNTER — Encounter: Payer: Self-pay | Admitting: *Deleted

## 2018-01-28 DIAGNOSIS — E785 Hyperlipidemia, unspecified: Secondary | ICD-10-CM | POA: Insufficient documentation

## 2018-01-28 DIAGNOSIS — E1151 Type 2 diabetes mellitus with diabetic peripheral angiopathy without gangrene: Secondary | ICD-10-CM | POA: Diagnosis not present

## 2018-01-28 DIAGNOSIS — I6521 Occlusion and stenosis of right carotid artery: Secondary | ICD-10-CM | POA: Insufficient documentation

## 2018-01-28 DIAGNOSIS — I509 Heart failure, unspecified: Secondary | ICD-10-CM | POA: Diagnosis not present

## 2018-01-28 DIAGNOSIS — I2510 Atherosclerotic heart disease of native coronary artery without angina pectoris: Secondary | ICD-10-CM | POA: Insufficient documentation

## 2018-01-28 DIAGNOSIS — Z01818 Encounter for other preprocedural examination: Secondary | ICD-10-CM | POA: Diagnosis not present

## 2018-01-28 DIAGNOSIS — Z79899 Other long term (current) drug therapy: Secondary | ICD-10-CM | POA: Diagnosis not present

## 2018-01-28 DIAGNOSIS — I110 Hypertensive heart disease with heart failure: Secondary | ICD-10-CM | POA: Insufficient documentation

## 2018-01-28 DIAGNOSIS — Z7901 Long term (current) use of anticoagulants: Secondary | ICD-10-CM | POA: Diagnosis not present

## 2018-01-28 DIAGNOSIS — Z87891 Personal history of nicotine dependence: Secondary | ICD-10-CM | POA: Diagnosis not present

## 2018-01-28 DIAGNOSIS — I4891 Unspecified atrial fibrillation: Secondary | ICD-10-CM | POA: Diagnosis not present

## 2018-01-28 HISTORY — DX: Cardiac arrhythmia, unspecified: I49.9

## 2018-01-28 HISTORY — DX: Chronic kidney disease, unspecified: N18.9

## 2018-01-28 HISTORY — DX: Presence of xenogenic heart valve: Z95.3

## 2018-01-28 LAB — CBC
HCT: 43.3 % (ref 39.0–52.0)
Hemoglobin: 13.8 g/dL (ref 13.0–17.0)
MCH: 28.1 pg (ref 26.0–34.0)
MCHC: 31.9 g/dL (ref 30.0–36.0)
MCV: 88.2 fL (ref 78.0–100.0)
PLATELETS: 202 10*3/uL (ref 150–400)
RBC: 4.91 MIL/uL (ref 4.22–5.81)
RDW: 16.3 % — AB (ref 11.5–15.5)
WBC: 6.4 10*3/uL (ref 4.0–10.5)

## 2018-01-28 LAB — URINALYSIS, ROUTINE W REFLEX MICROSCOPIC
Bilirubin Urine: NEGATIVE
GLUCOSE, UA: 50 mg/dL — AB
Hgb urine dipstick: NEGATIVE
Ketones, ur: NEGATIVE mg/dL
Nitrite: NEGATIVE
PH: 6 (ref 5.0–8.0)
Protein, ur: NEGATIVE mg/dL
SPECIFIC GRAVITY, URINE: 1.02 (ref 1.005–1.030)

## 2018-01-28 LAB — COMPREHENSIVE METABOLIC PANEL
ALT: 14 U/L — ABNORMAL LOW (ref 17–63)
ANION GAP: 11 (ref 5–15)
AST: 26 U/L (ref 15–41)
Albumin: 3.8 g/dL (ref 3.5–5.0)
Alkaline Phosphatase: 69 U/L (ref 38–126)
BUN: 18 mg/dL (ref 6–20)
CHLORIDE: 102 mmol/L (ref 101–111)
CO2: 24 mmol/L (ref 22–32)
Calcium: 9 mg/dL (ref 8.9–10.3)
Creatinine, Ser: 1.03 mg/dL (ref 0.61–1.24)
GFR calc Af Amer: >60 mL/min (ref >60)
Glucose, Bld: 203 mg/dL — ABNORMAL HIGH (ref 65–99)
POTASSIUM: 4 mmol/L (ref 3.5–5.1)
Sodium: 137 mmol/L (ref 135–145)
TOTAL PROTEIN: 6.4 g/dL — AB (ref 6.5–8.1)
Total Bilirubin: 0.7 mg/dL (ref 0.3–1.2)

## 2018-01-28 LAB — TYPE AND SCREEN
ABO/RH(D): O POS
Antibody Screen: NEGATIVE

## 2018-01-28 LAB — GLUCOSE, CAPILLARY: GLUCOSE-CAPILLARY: 116 mg/dL — AB (ref 65–99)

## 2018-01-28 LAB — SURGICAL PCR SCREEN
MRSA, PCR: NEGATIVE
Staphylococcus aureus: POSITIVE — AB

## 2018-01-28 NOTE — Patient Outreach (Addendum)
[place]) Care Management 01/27/18  Nathaniel [name] 14-Feb-1932 [id number]  Outreach attempt #1 to patient. Patient stated, he was unavailable.  Plan: RN CM will send unsuccessful outreach letter to patient and will close case if no response from patient within 10 business days.  Nathaniel Bells, RN, BSN, MHA/MSL, [place] Telephonic Care Manager Coordinator [facility] Direct Phone: [phone] Cell Phone: [phone] Toll Free: [phone] Fax: [phone]

## 2018-01-28 NOTE — Progress Notes (Signed)
Surgical PCR +staph. Mupirocin called in to [place]. Notified patient. Verbalized understanding.

## 2018-01-28 NOTE — Pre-Procedure Instructions (Signed)
Nathaniel [name]  01/28/2018      [facility] - [place], [place] - [address] [place] [address] Phone: [phone] Fax: [phone]  [facility] for [place], [place] [address] Phone: [phone] Fax: [phone]  [facility] [place], [place] - [place] [address] [place] [address] Phone: [phone] Fax: [phone]    Your procedure is scheduled on 02-02-2018 Wednesday   Report to [facility] Admitting at 6:30 AM    Call this number if you have problems the morning of surgery:  [phone]   Remember:  Do not eat food or drink liquids after midnight.   Take these medicines the morning of surgery with A SIP OF WATER  Allopurinol(Zyloprim) Atorvastatin(Lipitor) Doxazosin(Cardura) Doxycycline(Vibra-tabs) Duoneb nebulizer Aspirin 81 mg Vascepa  STOP ,ANTIINFLAMMATORIES (IBUPROFEN,ALEVE,MOTRIN,ADVIL,GOODY'S POWDERS),HERBAL SUPPLEMENTS,FISH OIL,AND VITAMINS 5-7 DAYS PRIOR TO SURGERY   STOP COUMADIN 4 DAYS PRIOR TO SURGERY AND START ASPIRIN  81 MG      How to Manage Your Diabetes Before and After Surgery  Why is it important to control my blood sugar before and after surgery? . Improving blood sugar levels before and after surgery helps healing and can limit problems. . A way of improving blood sugar control is eating a healthy diet by: o  Eating less sugar and carbohydrates o  Increasing activity/exercise o  Talking with your doctor about reaching your blood sugar goals . High blood sugars (greater than 180 mg/dL) can raise your risk of infections and slow your recovery, so you will need to focus on controlling your diabetes during the weeks before surgery. . Make sure that the doctor who takes care of your diabetes knows about your planned surgery including the date and location.  How do  I manage my blood sugar before surgery? . Check your blood sugar at least 4 times a day, starting 2 days before surgery, to make sure that the level is not too high or low. o Check your blood sugar the morning of your surgery when you wake up and every 2 hours until you get to the Short Stay unit. . If your blood sugar is less than 70 mg/dL, you will need to treat for low blood sugar: o Do not take insulin. o Treat a low blood sugar (less than 70 mg/dL) with  cup of clear juice (cranberry or apple), 4 glucose tablets, OR glucose gel. Recheck blood sugar in 15 minutes after treatment (to make sure it is greater than 70 mg/dL). If your blood sugar is not greater than 70 mg/dL on recheck, call [phone] o  for further instructions. . Report your blood sugar to the short stay nurse when you get to Short Stay.  . If you are admitted to the hospital after surgery: o Your blood sugar will be checked by the staff and you will probably be given insulin after surgery (instead of oral diabetes medicines) to make sure you have good blood sugar levels. o The goal for blood sugar control after surgery is 80-180 mg/dL.              WHAT DO I DO ABOUT MY DIABETES MEDICATION?   Marland Kitchen Do not take oral diabetes medicines (pills) the morning of surgery.JANUVIA.       Other Instructions:     :  Reviewed and Endorsed by [facility] Patient Education Committee, August 2015   Do not wear jewelry,.  Do not wear lotions, powders, or perfumes, or deodorant.  Do not shave 48 hours prior to surgery.  Men may shave face and neck.   Do not bring valuables to the hospital.   [facility] is not responsible for any belongings or valuables.  Contacts, dentures or bridgework may not be worn into surgery.  Leave your suitcase in the car.  After surgery it may be brought to your room.  For patients admitted to the hospital, discharge time will be determined by your treatment team.  Patients  discharged the day of surgery will not be allowed to drive home.    Special Instructions: [place] - Preparing for Surgery  Before surgery, you can play an important role.  Because skin is not sterile, your skin needs to be as free of germs as possible.  You can reduce the number of germs on you skin by washing with CHG (chlorahexidine gluconate) soap before surgery.  CHG is an antiseptic cleaner which kills germs and bonds with the skin to continue killing germs even after washing.  Please DO NOT use if you have an allergy to CHG or antibacterial soaps.  If your skin becomes reddened/irritated stop using the CHG and inform your nurse when you arrive at Short Stay.  Do not shave (including legs and underarms) for at least 48 hours prior to the first CHG shower.  You may shave your face.  Please follow these instructions carefully:   1.  Shower with CHG Soap the night before surgery and the   morning of Surgery.  2.  If you choose to wash your hair, wash your hair first as usual with your normal shampoo.  3.  After you shampoo, rinse your hair and body thoroughly to remove the  Shampoo.  4.  Use CHG as you would any other liquid soap.  You can apply chg directly  to the skin and wash gently with scrungie or a clean washcloth.  5.  Apply the CHG Soap to your body ONLY FROM THE NECK DOWN.   Do not use on open wounds or open sores.  Avoid contact with your eyes,  ears, mouth and genitals (private parts).  Wash genitals (private parts) with your normal soap.  6.  Wash thoroughly, paying special attention to the area where your surgery will be performed.  7.  Thoroughly rinse your body with warm water from the neck down.  8.  DO NOT shower/wash with your normal soap after using and rinsing o  the CHG Soap.  9.  Pat yourself dry with a clean towel.            10.  Wear clean pajamas.            11.  Place clean sheets on your bed the night of your first shower and do not sleep with pets.  Day of  Surgery  Do not apply any lotions/deodorants the morning of surgery.  Please wear clean clothes to the hospital/surgery center.   Please read over the following fact sheets that you were given. MRSA Information and Surgical Site Infection Prevention

## 2018-01-31 ENCOUNTER — Encounter (HOSPITAL_COMMUNITY): Payer: Self-pay

## 2018-01-31 NOTE — Progress Notes (Addendum)
Anesthesia Chart Review:  Pt is an 84 year old male scheduled for R CEA on 02/02/2018 with Harold Barban, MD  Providers:  - PCP/endocrinologist is Lorne Skeens, MD (notes in care everywhere)   - Pulmonary care at [facility]. Last office visit 01/06/18 with Eric Form, NP who treated pt with 7 days doxycycline for patchy infiltrate on CT chest   - Cardiologist is [name], MD who is aware of upcoming surgery. Last office visit 12/30/17   PMH includes:  CAD (s/p CABG 1989 and redo CABG 2007), CHF, atrial fibrillation, severe aortic stenosis (s/p bioprosthetic aortic valve 2007), PAD, HTN, DM, hyperlipidemia, CKD, AAA (s/p EVAR 04/13/14).  Former smoker.  BMI 27.  - Hospitalized 1/6 - 12/22/17 for afib with RVR (s/p cardioversion to NSR), metapneumovirus infection (tx with tamiflu), acute on chronic combined systolic and diastolic HF. Found to have severe R carotid stenosis (CEA has to wait until 1 month after cardioversion)   Medications include: Lipitor, doxazosin, Zetia, Lasix, Vascepa, DuoNeb, ramipril, sitagliptin, Spironolactone, Coumadin. Pt to hold coumadin 5 days prior to surgery.   BP (!) 152/53   Pulse 61   Temp 36.7 C   Resp 18   Ht 5' 10.5" (1.791 m)   Wt 191 lb 12.8 oz (87 kg)   SpO2 95%   BMI 27.13 kg/m   Preoperative labs reviewed.  - Glucose 203. HbA1c was 6.5 on  11/08/17 (care everywhere) - PT/PTT will be obtained day of surgery  CXR 12/19/17: No evidence of acute cardiopulmonary disease.  EKG 12/30/17: NSR. Incomplete LBBB. T wave abnormality, consider inferior ischemia and lateral ischemia.  CT chest 01/05/18:  1. New mild patchy tree-in-bud opacity in the dependent left lower lobe, compatible with a nonspecific mild infectious or inflammatory bronchiolitis, with the differential including mild aspiration. 2. Ill-defined subsolid 6 mm apical right upper lobe pulmonary nodule is stable for 2 years and probably postinflammatory. Annual noncontrast chest  CT follow-up is recommended until 5 years of stability has been established.  Carotid duplex 12/21/17:  - Right Carotid: There is evidence in the right ICA of a 80-99% stenosis. Stenosis appears more severe than prior study 12/2016. Difficult exam due to tissue attenuation and poor windows. - Left Carotid: There is evidence in the left ICA of a 1-39% stenosis. Difficult exam due to tissue attenuation and poor windows. - Vertebrals: Left vertebral artery was patent with antegrade flow. Right vertebral artery was not visualized.  Echo 12/21/17:  - Left ventricle: The cavity size was mildly dilated. Wall thickness was normal. Systolic function was mildly reduced. The estimated ejection fraction was in the range of 45% to 50%. The study is not technically sufficient to allow evaluation of LV diastolic function. - Aortic valve: Normal functioning bio-prosthetic valve with no significant AR Valve area (Vmax): 2.01 cm^2. - Mitral valve: Severely calcified annulus. There was mild regurgitation. - Left atrium: The atrium was severely dilated. - Atrial septum: No defect or patent foramen ovale was identified.  EVAR duplex 02/15/17: Patent stent of abdominal aorta with maximum diameter 4 x 4.1 cm  Cardiac cath 07/10/14:  1. LM: Totally occluded at ostium. 2.  LAD: Totally occluded.  LIMA-LAD patent.  40-50% stenosis in the LIMA at the touchdown on the LAD. 3.  CX: Totally occluded CX.  There was a sequential SVG-OM, ramus, diagonal.  The limbs to the OM and diagonal are occluded (known from prior study). The limb to the ramus is patent with 40% stenosis at touchdown. The  ramus gives faint collaterals to the OM system.   4. RCA: Totally occluded proximally.  Sequential SVG-PDA and PLV from 1st surgery is totally occluded (known).  The SVG -PDA from redo surgery is patent with long 50-60% mid RCA stenosis.  There is good flow to the PDA.  - Final conclusions: Severe CAD.  However, anatomy seems to be stable.  The  new SVG-PDA is diseased but still patent.  He is not having chest pain.  EF is lower, could be related to atrial fibrillation versus progressive remodeling of ischemic cardiomyopathy  If no changes, I anticipate pt can proceed with surgery as scheduled.   Willeen Cass, FNP-BC [facility] Short Stay Surgical Center/Anesthesiology Phone: [phone] 01/31/2018 12:34 PM

## 2018-02-01 NOTE — Anesthesia Preprocedure Evaluation (Addendum)
Anesthesia Evaluation  Patient identified by MRN, date of birth, ID band Patient awake    Reviewed: Allergy & Precautions, H&P , NPO status , Patient's Chart, lab work & pertinent test results  Airway Mallampati: III  TM Distance: >3 FB Neck ROM: Full    Dental no notable dental hx. (+) Teeth Intact, Dental Advisory Given   Pulmonary neg pulmonary ROS, former smoker,    Pulmonary exam normal breath sounds clear to auscultation       Cardiovascular Exercise Tolerance: Good hypertension, Pt. on medications + CAD, + CABG, + Peripheral Vascular Disease and +CHF  + dysrhythmias Atrial Fibrillation  Rhythm:Regular Rate:Normal     Neuro/Psych negative neurological ROS  negative psych ROS   GI/Hepatic Neg liver ROS, GERD  Medicated and Controlled,  Endo/Other  diabetes, Type 2, Oral Hypoglycemic Agents  Renal/GU Renal InsufficiencyRenal disease  negative genitourinary   Musculoskeletal   Abdominal   Peds  Hematology negative hematology ROS (+)   Anesthesia Other Findings   Reproductive/Obstetrics negative OB ROS                           Anesthesia Physical Anesthesia Plan  ASA: III  Anesthesia Plan: General   Post-op Pain Management:    Induction: Intravenous  PONV Risk Score and Plan: 3 and Ondansetron, Dexamethasone and Treatment may vary due to age or medical condition  Airway Management Planned: Oral ETT  Additional Equipment: Arterial line  Intra-op Plan:   Post-operative Plan: Extubation in OR  Informed Consent: I have reviewed the patients History and Physical, chart, labs and discussed the procedure including the risks, benefits and alternatives for the proposed anesthesia with the patient or authorized representative who has indicated his/her understanding and acceptance.   Dental advisory given  Plan Discussed with: CRNA  Anesthesia Plan Comments:          Anesthesia Quick Evaluation

## 2018-02-02 ENCOUNTER — Inpatient Hospital Stay (HOSPITAL_COMMUNITY)
Admission: RE | Admit: 2018-02-02 | Discharge: 2018-02-03 | DRG: 038 | Disposition: A | Discharge status: Home Health | Payer: Medicare Other | Source: Ambulatory Visit | Attending: Surgery | Admitting: Surgery

## 2018-02-02 ENCOUNTER — Inpatient Hospital Stay (HOSPITAL_COMMUNITY): Payer: Medicare Other | Admitting: Emergency Medicine

## 2018-02-02 ENCOUNTER — Encounter (HOSPITAL_COMMUNITY)
Admission: RE | Disposition: A | Discharge status: Home Health | Payer: Self-pay | Source: Ambulatory Visit | Attending: Surgery

## 2018-02-02 ENCOUNTER — Inpatient Hospital Stay (HOSPITAL_COMMUNITY): Payer: Medicare Other | Admitting: Certified Registered Nurse Anesthetist

## 2018-02-02 ENCOUNTER — Encounter (HOSPITAL_COMMUNITY): Payer: Self-pay

## 2018-02-02 DIAGNOSIS — I739 Peripheral vascular disease, unspecified: Secondary | ICD-10-CM

## 2018-02-02 DIAGNOSIS — I110 Hypertensive heart disease with heart failure: Secondary | ICD-10-CM | POA: Diagnosis not present

## 2018-02-02 DIAGNOSIS — Z7984 Long term (current) use of oral hypoglycemic drugs: Secondary | ICD-10-CM

## 2018-02-02 DIAGNOSIS — Z953 Presence of xenogenic heart valve: Secondary | ICD-10-CM

## 2018-02-02 DIAGNOSIS — N189 Chronic kidney disease, unspecified: Secondary | ICD-10-CM | POA: Diagnosis present

## 2018-02-02 DIAGNOSIS — I4891 Unspecified atrial fibrillation: Secondary | ICD-10-CM | POA: Diagnosis present

## 2018-02-02 DIAGNOSIS — E1122 Type 2 diabetes mellitus with diabetic chronic kidney disease: Secondary | ICD-10-CM | POA: Diagnosis present

## 2018-02-02 DIAGNOSIS — E785 Hyperlipidemia, unspecified: Secondary | ICD-10-CM | POA: Diagnosis present

## 2018-02-02 DIAGNOSIS — I480 Paroxysmal atrial fibrillation: Secondary | ICD-10-CM

## 2018-02-02 DIAGNOSIS — Z8249 Family history of ischemic heart disease and other diseases of the circulatory system: Secondary | ICD-10-CM

## 2018-02-02 DIAGNOSIS — I5043 Acute on chronic combined systolic (congestive) and diastolic (congestive) heart failure: Secondary | ICD-10-CM | POA: Diagnosis not present

## 2018-02-02 DIAGNOSIS — I5022 Chronic systolic (congestive) heart failure: Secondary | ICD-10-CM

## 2018-02-02 DIAGNOSIS — I130 Hypertensive heart and chronic kidney disease with heart failure and stage 1 through stage 4 chronic kidney disease, or unspecified chronic kidney disease: Secondary | ICD-10-CM | POA: Diagnosis not present

## 2018-02-02 DIAGNOSIS — Z79899 Other long term (current) drug therapy: Secondary | ICD-10-CM

## 2018-02-02 DIAGNOSIS — E1151 Type 2 diabetes mellitus with diabetic peripheral angiopathy without gangrene: Secondary | ICD-10-CM | POA: Diagnosis not present

## 2018-02-02 DIAGNOSIS — I779 Disorder of arteries and arterioles, unspecified: Secondary | ICD-10-CM

## 2018-02-02 DIAGNOSIS — I6521 Occlusion and stenosis of right carotid artery: Principal | ICD-10-CM | POA: Diagnosis present

## 2018-02-02 DIAGNOSIS — I509 Heart failure, unspecified: Secondary | ICD-10-CM | POA: Diagnosis present

## 2018-02-02 DIAGNOSIS — Z87891 Personal history of nicotine dependence: Secondary | ICD-10-CM | POA: Diagnosis not present

## 2018-02-02 DIAGNOSIS — I2510 Atherosclerotic heart disease of native coronary artery without angina pectoris: Secondary | ICD-10-CM | POA: Diagnosis present

## 2018-02-02 DIAGNOSIS — Z7901 Long term (current) use of anticoagulants: Secondary | ICD-10-CM

## 2018-02-02 DIAGNOSIS — Z419 Encounter for procedure for purposes other than remedying health state, unspecified: Secondary | ICD-10-CM

## 2018-02-02 HISTORY — PX: ENDARTERECTOMY: SHX5162

## 2018-02-02 LAB — GLUCOSE, CAPILLARY
GLUCOSE-CAPILLARY: 150 mg/dL — AB (ref 65–99)
GLUCOSE-CAPILLARY: 163 mg/dL — AB (ref 65–99)
Glucose-Capillary: 117 mg/dL — ABNORMAL HIGH (ref 65–99)
Glucose-Capillary: 215 mg/dL — ABNORMAL HIGH (ref 65–99)

## 2018-02-02 LAB — PROTIME-INR
INR: 1.13
Prothrombin Time: 14.4 seconds (ref 11.4–15.2)

## 2018-02-02 LAB — APTT: aPTT: 30 seconds (ref 24–36)

## 2018-02-02 SURGERY — ENDARTERECTOMY, CAROTID
Anesthesia: General | Site: Neck | Laterality: Right

## 2018-02-02 MED ORDER — SODIUM CHLORIDE 0.9 % IV SOLN
1.5000 g | Freq: Two times a day (BID) | INTRAVENOUS | Status: AC
  Administered 2018-02-03: 1.5 g via INTRAVENOUS
  Filled 2018-02-02 (×2): qty 1.5

## 2018-02-02 MED ORDER — GUAIFENESIN-DM 100-10 MG/5ML PO SYRP: 15.0000 mL | ORAL_SOLUTION | ORAL | Status: DC | PRN

## 2018-02-02 MED ORDER — FINASTERIDE 5 MG PO TABS
5.0000 mg | ORAL_TABLET | Freq: Every day | ORAL | Status: DC
  Administered 2018-02-02: 5 mg via ORAL
  Filled 2018-02-02: qty 1

## 2018-02-02 MED ORDER — 0.9 % SODIUM CHLORIDE (POUR BTL) OPTIME
TOPICAL | Status: DC | PRN
  Administered 2018-02-02: 1000 mL

## 2018-02-02 MED ORDER — SODIUM CHLORIDE 0.9 % IV SOLN
1.5000 g | INTRAVENOUS | Status: AC
  Administered 2018-02-02: 1.5 g via INTRAVENOUS

## 2018-02-02 MED ORDER — ROCURONIUM BROMIDE 10 MG/ML (PF) SYRINGE
PREFILLED_SYRINGE | INTRAVENOUS | Status: AC
  Filled 2018-02-02: qty 5

## 2018-02-02 MED ORDER — EZETIMIBE 10 MG PO TABS
10.0000 mg | ORAL_TABLET | Freq: Every day | ORAL | Status: DC
  Administered 2018-02-02: 10 mg via ORAL
  Filled 2018-02-02: qty 1

## 2018-02-02 MED ORDER — LIDOCAINE HCL (PF) 1 % IJ SOLN
INTRAMUSCULAR | Status: AC
  Filled 2018-02-02: qty 30

## 2018-02-02 MED ORDER — LIDOCAINE 2% (20 MG/ML) 5 ML SYRINGE
INTRAMUSCULAR | Status: AC
  Filled 2018-02-02: qty 5

## 2018-02-02 MED ORDER — ROCURONIUM BROMIDE 10 MG/ML (PF) SYRINGE
PREFILLED_SYRINGE | INTRAVENOUS | Status: DC | PRN
  Administered 2018-02-02: 10 mg via INTRAVENOUS
  Administered 2018-02-02: 50 mg via INTRAVENOUS
  Administered 2018-02-02: 10 mg via INTRAVENOUS

## 2018-02-02 MED ORDER — ACETAMINOPHEN 650 MG RE SUPP: 325.0000 mg | RECTAL | Status: DC | PRN

## 2018-02-02 MED ORDER — PROPOFOL 10 MG/ML IV BOLUS
INTRAVENOUS | Status: DC | PRN
  Administered 2018-02-02: 100 mg via INTRAVENOUS

## 2018-02-02 MED ORDER — ACETAMINOPHEN 325 MG PO TABS: 325.0000 mg | ORAL_TABLET | ORAL | Status: DC | PRN

## 2018-02-02 MED ORDER — HYDRALAZINE HCL 20 MG/ML IJ SOLN: 5.0000 mg | INTRAMUSCULAR | Status: DC | PRN

## 2018-02-02 MED ORDER — HEMOSTATIC AGENTS (NO CHARGE) OPTIME
TOPICAL | Status: DC | PRN
  Administered 2018-02-02: 1 via TOPICAL

## 2018-02-02 MED ORDER — SPIRONOLACTONE 25 MG PO TABS
25.0000 mg | ORAL_TABLET | Freq: Every day | ORAL | Status: DC
  Administered 2018-02-02: 25 mg via ORAL
  Filled 2018-02-02: qty 1

## 2018-02-02 MED ORDER — TIMOLOL MALEATE 0.5 % OP SOLN
1.0000 [drp] | Freq: Every day | OPHTHALMIC | Status: DC
  Filled 2018-02-02: qty 5

## 2018-02-02 MED ORDER — OXYCODONE-ACETAMINOPHEN 5-325 MG PO TABS: 1.0000 | ORAL_TABLET | ORAL | Status: DC | PRN

## 2018-02-02 MED ORDER — ASPIRIN EC 81 MG PO TBEC
81.0000 mg | DELAYED_RELEASE_TABLET | Freq: Every day | ORAL | Status: DC
  Administered 2018-02-03: 81 mg via ORAL
  Filled 2018-02-02: qty 1

## 2018-02-02 MED ORDER — FENTANYL CITRATE (PF) 250 MCG/5ML IJ SOLN
INTRAMUSCULAR | Status: DC | PRN
  Administered 2018-02-02: 50 ug via INTRAVENOUS

## 2018-02-02 MED ORDER — SODIUM CHLORIDE 0.9 % IV SOLN
0.0125 ug/kg/min | INTRAVENOUS | Status: DC
  Filled 2018-02-02: qty 2000

## 2018-02-02 MED ORDER — DOXAZOSIN MESYLATE 2 MG PO TABS
1.0000 mg | ORAL_TABLET | Freq: Every day | ORAL | Status: DC
  Administered 2018-02-02: 1 mg via ORAL
  Filled 2018-02-02: qty 1

## 2018-02-02 MED ORDER — LIDOCAINE 2% (20 MG/ML) 5 ML SYRINGE
INTRAMUSCULAR | Status: DC | PRN
  Administered 2018-02-02: 60 mg via INTRAVENOUS

## 2018-02-02 MED ORDER — ONDANSETRON HCL 4 MG/2ML IJ SOLN
INTRAMUSCULAR | Status: DC | PRN
  Administered 2018-02-02: 4 mg via INTRAVENOUS

## 2018-02-02 MED ORDER — DEXAMETHASONE SODIUM PHOSPHATE 10 MG/ML IJ SOLN
INTRAMUSCULAR | Status: AC
  Filled 2018-02-02: qty 1

## 2018-02-02 MED ORDER — SODIUM CHLORIDE 0.9 % IV SOLN: 500.0000 mL | Freq: Once | INTRAVENOUS | Status: DC | PRN

## 2018-02-02 MED ORDER — ONDANSETRON HCL 4 MG/2ML IJ SOLN: 4.0000 mg | Freq: Four times a day (QID) | INTRAMUSCULAR | Status: DC | PRN

## 2018-02-02 MED ORDER — PROPOFOL 10 MG/ML IV BOLUS
INTRAVENOUS | Status: AC
  Filled 2018-02-02: qty 20

## 2018-02-02 MED ORDER — POLYETHYLENE GLYCOL 3350 17 G PO PACK: 17.0000 g | PACK | Freq: Every day | ORAL | Status: DC | PRN

## 2018-02-02 MED ORDER — MAGNESIUM SULFATE 2 GM/50ML IV SOLN: 2.0000 g | Freq: Every day | INTRAVENOUS | Status: DC | PRN

## 2018-02-02 MED ORDER — EPHEDRINE SULFATE-NACL 50-0.9 MG/10ML-% IV SOSY
PREFILLED_SYRINGE | INTRAVENOUS | Status: DC | PRN
  Administered 2018-02-02 (×3): 5 mg via INTRAVENOUS
  Administered 2018-02-02: 10 mg via INTRAVENOUS
  Administered 2018-02-02: 5 mg via INTRAVENOUS

## 2018-02-02 MED ORDER — REMIFENTANIL HCL 2 MG IV SOLR
INTRAVENOUS | Status: DC | PRN
  Administered 2018-02-02: .5 ug/kg/min via INTRAVENOUS

## 2018-02-02 MED ORDER — SODIUM CHLORIDE 0.9 % IV SOLN
INTRAVENOUS | Status: AC
  Filled 2018-02-02: qty 1.5

## 2018-02-02 MED ORDER — MORPHINE SULFATE (PF) 4 MG/ML IV SOLN: 2.0000 mg | INTRAVENOUS | Status: DC | PRN

## 2018-02-02 MED ORDER — HEPARIN SODIUM (PORCINE) 1000 UNIT/ML IJ SOLN
INTRAMUSCULAR | Status: DC | PRN
  Administered 2018-02-02: 2000 [IU] via INTRAVENOUS
  Administered 2018-02-02: 9000 [IU] via INTRAVENOUS

## 2018-02-02 MED ORDER — CHLORHEXIDINE GLUCONATE 4 % EX LIQD: 60.0000 mL | Freq: Once | CUTANEOUS | Status: DC

## 2018-02-02 MED ORDER — PROTAMINE SULFATE 10 MG/ML IV SOLN
INTRAVENOUS | Status: DC | PRN
  Administered 2018-02-02: 50 mg via INTRAVENOUS

## 2018-02-02 MED ORDER — ONDANSETRON HCL 4 MG/2ML IJ SOLN
INTRAMUSCULAR | Status: AC
  Filled 2018-02-02: qty 2

## 2018-02-02 MED ORDER — SUGAMMADEX SODIUM 200 MG/2ML IV SOLN
INTRAVENOUS | Status: DC | PRN
  Administered 2018-02-02: 150 mg via INTRAVENOUS

## 2018-02-02 MED ORDER — IPRATROPIUM-ALBUTEROL 0.5-2.5 (3) MG/3ML IN SOLN
3.0000 mL | Freq: Two times a day (BID) | RESPIRATORY_TRACT | Status: DC
  Filled 2018-02-02: qty 3

## 2018-02-02 MED ORDER — GLYCOPYRROLATE 0.2 MG/ML IV SOSY
PREFILLED_SYRINGE | INTRAVENOUS | Status: DC | PRN
  Administered 2018-02-02 (×3): .1 mg via INTRAVENOUS

## 2018-02-02 MED ORDER — CO Q10 100 MG PO CAPS: 100.0000 mg | ORAL_CAPSULE | Freq: Every day | ORAL | Status: DC

## 2018-02-02 MED ORDER — LATANOPROST 0.005 % OP SOLN
1.0000 [drp] | Freq: Every day | OPHTHALMIC | Status: DC
  Administered 2018-02-02: 1 [drp] via OPHTHALMIC
  Filled 2018-02-02: qty 2.5

## 2018-02-02 MED ORDER — LACTATED RINGERS IV SOLN
INTRAVENOUS | Status: DC | PRN
  Administered 2018-02-02: 08:00:00 via INTRAVENOUS

## 2018-02-02 MED ORDER — EPHEDRINE 5 MG/ML INJ
INTRAVENOUS | Status: AC
  Filled 2018-02-02: qty 40

## 2018-02-02 MED ORDER — POTASSIUM CHLORIDE CRYS ER 20 MEQ PO TBCR: 20.0000 meq | EXTENDED_RELEASE_TABLET | Freq: Every day | ORAL | Status: DC | PRN

## 2018-02-02 MED ORDER — TAMSULOSIN HCL 0.4 MG PO CAPS
0.4000 mg | ORAL_CAPSULE | Freq: Every day | ORAL | Status: DC
  Administered 2018-02-02: 0.4 mg via ORAL
  Filled 2018-02-02: qty 1

## 2018-02-02 MED ORDER — BISACODYL 10 MG RE SUPP: 10.0000 mg | Freq: Every day | RECTAL | Status: DC | PRN

## 2018-02-02 MED ORDER — PHENYLEPHRINE 40 MCG/ML (10ML) SYRINGE FOR IV PUSH (FOR BLOOD PRESSURE SUPPORT)
PREFILLED_SYRINGE | INTRAVENOUS | Status: AC
  Filled 2018-02-02: qty 10

## 2018-02-02 MED ORDER — PHENOL 1.4 % MT LIQD: 1.0000 | OROMUCOSAL | Status: DC | PRN

## 2018-02-02 MED ORDER — DOXAZOSIN MESYLATE 2 MG PO TABS
2.0000 mg | ORAL_TABLET | Freq: Every day | ORAL | Status: DC
  Administered 2018-02-03: 2 mg via ORAL
  Filled 2018-02-02: qty 1

## 2018-02-02 MED ORDER — METOPROLOL TARTRATE 5 MG/5ML IV SOLN: 2.0000 mg | INTRAVENOUS | Status: DC | PRN

## 2018-02-02 MED ORDER — LABETALOL HCL 5 MG/ML IV SOLN: 10.0000 mg | INTRAVENOUS | Status: DC | PRN

## 2018-02-02 MED ORDER — PANTOPRAZOLE SODIUM 40 MG PO TBEC
40.0000 mg | DELAYED_RELEASE_TABLET | Freq: Every day | ORAL | Status: DC
  Administered 2018-02-03: 40 mg via ORAL
  Filled 2018-02-02: qty 1

## 2018-02-02 MED ORDER — PHENYLEPHRINE HCL 10 MG/ML IJ SOLN
INTRAVENOUS | Status: DC | PRN
  Administered 2018-02-02: 30 ug/min via INTRAVENOUS

## 2018-02-02 MED ORDER — RAMIPRIL 5 MG PO CAPS
5.0000 mg | ORAL_CAPSULE | Freq: Every day | ORAL | Status: DC
  Administered 2018-02-03: 5 mg via ORAL
  Filled 2018-02-02: qty 1

## 2018-02-02 MED ORDER — ALUM & MAG HYDROXIDE-SIMETH 200-200-20 MG/5ML PO SUSP: 15.0000 mL | ORAL | Status: DC | PRN

## 2018-02-02 MED ORDER — SODIUM CHLORIDE 0.9 % IV SOLN: INTRAVENOUS | Status: DC

## 2018-02-02 MED ORDER — ALLOPURINOL 300 MG PO TABS
300.0000 mg | ORAL_TABLET | Freq: Every day | ORAL | Status: DC
  Administered 2018-02-03: 300 mg via ORAL
  Filled 2018-02-02: qty 1

## 2018-02-02 MED ORDER — ATORVASTATIN CALCIUM 20 MG PO TABS: 20.0000 mg | ORAL_TABLET | ORAL | Status: DC

## 2018-02-02 MED ORDER — FENTANYL CITRATE (PF) 250 MCG/5ML IJ SOLN
INTRAMUSCULAR | Status: AC
  Filled 2018-02-02: qty 5

## 2018-02-02 MED ORDER — DEXAMETHASONE SODIUM PHOSPHATE 10 MG/ML IJ SOLN
INTRAMUSCULAR | Status: DC | PRN
  Administered 2018-02-02: 5 mg via INTRAVENOUS

## 2018-02-02 MED ORDER — SUCCINYLCHOLINE CHLORIDE 200 MG/10ML IV SOSY
PREFILLED_SYRINGE | INTRAVENOUS | Status: AC
  Filled 2018-02-02: qty 40

## 2018-02-02 MED ORDER — ESMOLOL HCL 100 MG/10ML IV SOLN
INTRAVENOUS | Status: DC | PRN
  Administered 2018-02-02 (×2): 50 mg via INTRAVENOUS

## 2018-02-02 MED ORDER — FUROSEMIDE 20 MG PO TABS: 20.0000 mg | ORAL_TABLET | ORAL | Status: DC

## 2018-02-02 MED ORDER — DOCUSATE SODIUM 100 MG PO CAPS
100.0000 mg | ORAL_CAPSULE | Freq: Every day | ORAL | Status: DC
  Administered 2018-02-03: 100 mg via ORAL
  Filled 2018-02-02: qty 1

## 2018-02-02 MED ORDER — HYDROMORPHONE HCL 1 MG/ML IJ SOLN: 0.2500 mg | INTRAMUSCULAR | Status: DC | PRN

## 2018-02-02 MED ORDER — INSULIN ASPART 100 UNIT/ML ~~LOC~~ SOLN
0.0000 [IU] | Freq: Three times a day (TID) | SUBCUTANEOUS | Status: DC
  Administered 2018-02-03: 2 [IU] via SUBCUTANEOUS
  Administered 2018-02-03: 3 [IU] via SUBCUTANEOUS

## 2018-02-02 MED ORDER — IPRATROPIUM-ALBUTEROL 0.5-2.5 (3) MG/3ML IN SOLN
3.0000 mL | Freq: Four times a day (QID) | RESPIRATORY_TRACT | Status: DC
  Administered 2018-02-02: 3 mL via RESPIRATORY_TRACT
  Filled 2018-02-02: qty 3

## 2018-02-02 MED ORDER — SODIUM CHLORIDE 0.9 % IV SOLN
INTRAVENOUS | Status: DC | PRN
  Administered 2018-02-02: 09:00:00 500 mL

## 2018-02-02 SURGICAL SUPPLY — 49 items
ADH SKN CLS APL DERMABOND .7 (GAUZE/BANDAGES/DRESSINGS) ×1
CANISTER SUCT 3000ML PPV (MISCELLANEOUS) ×3 IMPLANT
CATH ROBINSON RED A/P 18FR (CATHETERS) ×3 IMPLANT
CATH SUCT 10FR WHISTLE TIP (CATHETERS) ×3 IMPLANT
CLIP VESOCCLUDE MED 6/CT (CLIP) ×3 IMPLANT
CLIP VESOCCLUDE SM WIDE 6/CT (CLIP) ×3 IMPLANT
COVER PROBE W GEL 5X96 (DRAPES) IMPLANT
CRADLE DONUT ADULT HEAD (MISCELLANEOUS) ×3 IMPLANT
DERMABOND ADVANCED (GAUZE/BANDAGES/DRESSINGS) ×2
DERMABOND ADVANCED .7 DNX12 (GAUZE/BANDAGES/DRESSINGS) ×1 IMPLANT
DRAIN CHANNEL 15F RND FF W/TCR (WOUND CARE) ×2 IMPLANT
ELECT REM PT RETURN 9FT ADLT (ELECTROSURGICAL) ×3
ELECTRODE REM PT RTRN 9FT ADLT (ELECTROSURGICAL) ×1 IMPLANT
EVACUATOR SILICONE 100CC (DRAIN) ×2 IMPLANT
GAUZE SPONGE 4X4 12PLY STRL (GAUZE/BANDAGES/DRESSINGS) ×2 IMPLANT
GLOVE BIOGEL PI IND STRL 7.5 (GLOVE) ×1 IMPLANT
GLOVE BIOGEL PI INDICATOR 7.5 (GLOVE) ×2
GLOVE SURG SS PI 7.5 STRL IVOR (GLOVE) ×3 IMPLANT
GOWN STRL REUS W/ TWL LRG LVL3 (GOWN DISPOSABLE) ×2 IMPLANT
GOWN STRL REUS W/ TWL XL LVL3 (GOWN DISPOSABLE) ×1 IMPLANT
GOWN STRL REUS W/TWL LRG LVL3 (GOWN DISPOSABLE) ×6
GOWN STRL REUS W/TWL XL LVL3 (GOWN DISPOSABLE) ×3
HEMOSTAT SNOW SURGICEL 2X4 (HEMOSTASIS) ×2 IMPLANT
INSERT FOGARTY SM (MISCELLANEOUS) IMPLANT
KIT BASIN OR (CUSTOM PROCEDURE TRAY) ×3 IMPLANT
KIT ROOM TURNOVER OR (KITS) ×3 IMPLANT
KIT SHUNT ARGYLE CAROTID ART 6 (VASCULAR PRODUCTS) IMPLANT
NDL HYPO 25GX1X1/2 BEV (NEEDLE) IMPLANT
NEEDLE HYPO 25GX1X1/2 BEV (NEEDLE) IMPLANT
NS IRRIG 1000ML POUR BTL (IV SOLUTION) ×9 IMPLANT
PACK CAROTID (CUSTOM PROCEDURE TRAY) ×3 IMPLANT
PAD ARMBOARD 7.5X6 YLW CONV (MISCELLANEOUS) ×6 IMPLANT
PATCH VASC XENOSURE 1CMX6CM (Vascular Products) ×3 IMPLANT
PATCH VASC XENOSURE 1X6 (Vascular Products) IMPLANT
SHUNT CAROTID BYPASS 10 (VASCULAR PRODUCTS) IMPLANT
SHUNT CAROTID BYPASS 12FRX15.5 (VASCULAR PRODUCTS) IMPLANT
SUT ETHILON 3 0 PS 1 (SUTURE) ×2 IMPLANT
SUT PROLENE 6 0 BV (SUTURE) ×3 IMPLANT
SUT PROLENE 7 0 BV 1 (SUTURE) IMPLANT
SUT PROLENE 7 0 BV1 MDA (SUTURE) ×2 IMPLANT
SUT SILK 3 0 (SUTURE)
SUT SILK 3-0 18XBRD TIE 12 (SUTURE) IMPLANT
SUT VIC AB 3-0 SH 27 (SUTURE) ×6
SUT VIC AB 3-0 SH 27X BRD (SUTURE) ×2 IMPLANT
SUT VICRYL 4-0 PS2 18IN ABS (SUTURE) ×3 IMPLANT
SYR CONTROL 10ML LL (SYRINGE) IMPLANT
TAPE CLOTH SURG 4X10 WHT LF (GAUZE/BANDAGES/DRESSINGS) ×2 IMPLANT
TOWEL GREEN STERILE (TOWEL DISPOSABLE) ×3 IMPLANT
WATER STERILE IRR 1000ML POUR (IV SOLUTION) ×3 IMPLANT

## 2018-02-02 NOTE — Anesthesia Procedure Notes (Signed)
Arterial Line Insertion Start/End2/20/2019 8:10 AM Performed by: [place], Amedeo Plenty, Immunologist, CRNA  Patient location: Pre-op. Preanesthetic checklist: patient identified, IV checked, site marked, risks and benefits discussed, surgical consent, monitors and equipment checked, pre-op evaluation, timeout performed and anesthesia consent Lidocaine 1% used for infiltration Left, radial was placed Catheter size: 20 G Hand hygiene performed  and maximum sterile barriers used   Attempts: 1 Procedure performed without using ultrasound guided technique. Following insertion, Biopatch and dressing applied. Post procedure assessment: normal  Patient tolerated the procedure well with no immediate complications.

## 2018-02-02 NOTE — Interval H&P Note (Signed)
History and Physical Interval Note:  02/02/2018 7:35 AM  Nathaniel [name]  has presented today for surgery, with the diagnosis of RIGHT CAROTID STENOSIS  The various methods of treatment have been discussed with the patient and family. After consideration of risks, benefits and other options for treatment, the patient has consented to  Procedure(s): ENDARTERECTOMY CAROTID RIGHT (Right) as a surgical intervention .  The patient's history has been reviewed, patient examined, no change in status, stable for surgery.  I have reviewed the patient's chart and labs.  Questions were answered to the patient's satisfaction.     Annamarie Major

## 2018-02-02 NOTE — Progress Notes (Signed)
  Day of Surgery Note    Subjective:  C/o sore throat   Vitals:   02/02/18 1200 02/02/18 1204  BP:  117/61  Pulse: 87 83  Resp: 18 18  Temp:    SpO2: 90% (!) 89%    Incisions:   Clean and dry; drain with ~ 50cc bloody drainage Extremities:  Moving all extremities equally Lungs:  Non labored Neuro:  In tact; tongue is midline   Assessment/Plan:  This is a 84 y.o. male who is s/p  Right carotid endarterectomy  -pt doing well in recovery & neuro in tact; he does c/o sore throat -he has had ~ 50cc bloody drainage out of JP since coming out of the OR--continue to monitor. -if drainage better tomorrow, will restart coumadin.   Leontine Locket, PA-C 02/02/2018 12:18 PM [phone]

## 2018-02-02 NOTE — Transfer of Care (Signed)
Immediate Anesthesia Transfer of Care Note  Patient: Nathaniel [name]  Procedure(s) Performed: ENDARTERECTOMY CAROTID RIGHT (Right Neck)  Patient Location: PACU  Anesthesia Type:General  Level of Consciousness: awake, alert , oriented and patient cooperative  Airway & Oxygen Therapy: Patient Spontanous Breathing and Patient connected to nasal cannula oxygen  Post-op Assessment: Report given to RN, Post -op Vital signs reviewed and stable and Patient moving all extremities X 4  Post vital signs: Reviewed and stable  Last Vitals:  Vitals:   02/02/18 0645  BP: 139/70  Pulse: 75  Resp: 18  Temp: (!) 36.3 C  SpO2: 98%    Last Pain:  Vitals:   02/02/18 0645  TempSrc: Oral      Patients Stated Pain Goal: 3 (78/58/85 0277)  Complications: No apparent anesthesia complications

## 2018-02-02 NOTE — Anesthesia Procedure Notes (Signed)
Procedure Name: Intubation Date/Time: 02/02/2018 8:44 AM Performed by: Julieta Bellini, CRNA Pre-anesthesia Checklist: Patient identified, Emergency Drugs available, Suction available and Patient being monitored Patient Re-evaluated:Patient Re-evaluated prior to induction Oxygen Delivery Method: Circle system utilized Preoxygenation: Pre-oxygenation with 100% oxygen Induction Type: IV induction Ventilation: Mask ventilation without difficulty Laryngoscope Size: Mac and 4 Grade View: Grade I Tube type: Oral Tube size: 7.5 mm Number of attempts: 1 Airway Equipment and Method: Stylet Placement Confirmation: ETT inserted through vocal cords under direct vision,  positive ETCO2 and breath sounds checked- equal and bilateral Secured at: 23 cm Tube secured with: Tape Dental Injury: Teeth and Oropharynx as per pre-operative assessment

## 2018-02-02 NOTE — Anesthesia Postprocedure Evaluation (Signed)
Anesthesia Post Note  Patient: LANGLEY INGALLS  Procedure(s) Performed: ENDARTERECTOMY CAROTID RIGHT (Right Neck)     Patient location during evaluation: PACU Anesthesia Type: General Level of consciousness: awake and alert Pain management: pain level controlled Vital Signs Assessment: post-procedure vital signs reviewed and stable Respiratory status: spontaneous breathing, nonlabored ventilation, respiratory function stable and patient connected to nasal cannula oxygen Cardiovascular status: blood pressure returned to baseline and stable Postop Assessment: no apparent nausea or vomiting Anesthetic complications: no    Last Vitals:  Vitals:   02/02/18 1204 02/02/18 1215  BP: 117/61   Pulse: 83 84  Resp: 18 18  Temp:    SpO2: (!) 89% 93%    Last Pain:  Vitals:   02/02/18 0645  TempSrc: Oral                 Emori Kamau,W. EDMOND

## 2018-02-03 ENCOUNTER — Other Ambulatory Visit: Payer: Self-pay

## 2018-02-03 ENCOUNTER — Encounter (HOSPITAL_COMMUNITY): Payer: Self-pay | Admitting: Surgery

## 2018-02-03 LAB — BASIC METABOLIC PANEL
Anion gap: 9 (ref 5–15)
BUN: 15 mg/dL (ref 6–20)
CALCIUM: 8.7 mg/dL — AB (ref 8.9–10.3)
CO2: 24 mmol/L (ref 22–32)
Chloride: 102 mmol/L (ref 101–111)
Creatinine, Ser: 0.89 mg/dL (ref 0.61–1.24)
GFR calc non Af Amer: >60 mL/min (ref >60)
Glucose, Bld: 181 mg/dL — ABNORMAL HIGH (ref 65–99)
Potassium: 4.2 mmol/L (ref 3.5–5.1)
SODIUM: 135 mmol/L (ref 135–145)

## 2018-02-03 LAB — CBC
HEMATOCRIT: 37.8 % — AB (ref 39.0–52.0)
Hemoglobin: 12.6 g/dL — ABNORMAL LOW (ref 13.0–17.0)
MCH: 28.7 pg (ref 26.0–34.0)
MCHC: 33.3 g/dL (ref 30.0–36.0)
MCV: 86.1 fL (ref 78.0–100.0)
Platelets: 179 10*3/uL (ref 150–400)
RBC: 4.39 MIL/uL (ref 4.22–5.81)
RDW: 15.9 % — AB (ref 11.5–15.5)
WBC: 8.7 10*3/uL (ref 4.0–10.5)

## 2018-02-03 LAB — POCT ACTIVATED CLOTTING TIME
ACTIVATED CLOTTING TIME: 252 s
Activated Clotting Time: 230 seconds

## 2018-02-03 LAB — GLUCOSE, CAPILLARY
Glucose-Capillary: 153 mg/dL — ABNORMAL HIGH (ref 65–99)
Glucose-Capillary: 137 mg/dL — ABNORMAL HIGH (ref 65–99)
Glucose-Capillary: 113 mg/dL — ABNORMAL HIGH (ref 65–99)

## 2018-02-03 MED ORDER — IPRATROPIUM-ALBUTEROL 0.5-2.5 (3) MG/3ML IN SOLN: 3.0000 mL | Freq: Four times a day (QID) | RESPIRATORY_TRACT | Status: DC | PRN

## 2018-02-03 MED ORDER — SPIRONOLACTONE 25 MG PO TABS: 25.0000 mg | ORAL_TABLET | Freq: Every day | ORAL | Status: DC

## 2018-02-03 MED ORDER — OXYCODONE-ACETAMINOPHEN 5-325 MG PO TABS: 1.0000 | ORAL_TABLET | Freq: Four times a day (QID) | ORAL | 0 refills | Status: DC | PRN

## 2018-02-03 MED ORDER — ASPIRIN 81 MG PO TBEC: 81.0000 mg | DELAYED_RELEASE_TABLET | Freq: Every day | ORAL | Status: DC

## 2018-02-03 NOTE — Progress Notes (Signed)
D/C home at 5:15 pm with grand daugther.  Discharged medications and instructions reviewed & signed by pt.  All questions answered.  Assisted via w/c to private vehicle by staff.  IV removed w/o difficulty.  Pt left with belongings, hard copy prescription for oxycodone and d/c packet.

## 2018-02-03 NOTE — Consult Note (Signed)
   [facility] Inpatient Consult   02/03/2018  SAQIB CAZAREZ 1932/04/07 [id number]  Patient is currently active with [facility] for chronic disease management services.  Patient has been engaged by a Clinical biochemist and CSW.  Patient was being followed by [facility] HF calls.  Made Inpatient Case Manager aware that [facility] following. Inpatient RNCM made aware of [facility] Care Management had followed with EMMI calls prior to admission. She states that the patient is set up with [facility] for Home Health care.   Met with the patient at the bedside.  Patient states he will have home health nurse coming to check on him. Patient states he does not feel extra home visits will be needed. He was acceptable of Telephonic nurse to follow .  His risk score is 25%.  Of note, [facility] Care Management services does not replace or interfere with any services that are needed or arranged by inpatient case management or social work.  For additional questions or referrals please contact:   Natividad Brood, RN BSN [facility] Liaison  [phone] business mobile phone Toll free office [phone]

## 2018-02-03 NOTE — Care Management Note (Signed)
Case Management Note Marvetta Gibbons RN, BSN Unit 4E-Case Manager [phone]  Patient Details  Name: Nathaniel [name] MRN: [id number] Date of Birth: November 02, 1932  Subjective/Objective:  Pt admitted s/p CEA                   Action/Plan: PTA pt lived at home- notified by Jonelle Sidle with [facility] pt has pre-op referral to them for any HH needs- they will f/u with pt on discharge.   Expected Discharge Date:  02/03/18               Expected Discharge Plan:  [place]  In-House Referral:     Discharge planning Services  CM Consult  Post Acute Care Choice:  Home Health Choice offered to:     DME Arranged:    DME Agency:     HH Arranged:    [place] Agency:  [facility]  Status of Service:  Completed, signed off  If discussed at [place] of Stay Meetings, dates discussed:    Discharge Disposition: home/home health  Additional Comments:  Dawayne Patricia, RN 02/03/2018, 3:53 PM

## 2018-02-03 NOTE — Progress Notes (Signed)
JP drain removed w/o difficulty. Site intact with scant serosanguineous drainage.  Dry dressing applied.   Anson Crofts, RN

## 2018-02-03 NOTE — Progress Notes (Addendum)
  Progress Note    02/03/2018 7:19 AM 1 Day Post-Op  Subjective:  Says he really hasn't had any pain.  Upset they wouldn't give him sherbet bc of his yogurt allergy.   Afebrile HR  70's-80's I agree with the above.  I have seen and evaluated the patient.  He is postoperative day #1 54'T-625'W systolic 38% RA  Vitals:   02/02/18 2315 02/03/18 0511  BP: (!) 147/73 125/80  Pulse: 88 80  Resp:  19  Temp: 97.9 F (36.6 C) (!) 97.4 F (36.3 C)  SpO2: 98% 96%     Physical Exam: Neuro:  In tact; swallowing okay. Lungs:  Non labored Incision:  Clean and dry  CBC    Component Value Date/Time   WBC 8.7 02/03/2018 0250   RBC 4.39 02/03/2018 0250   HGB 12.6 (L) 02/03/2018 0250   HCT 37.8 (L) 02/03/2018 0250   PLT 179 02/03/2018 0250   MCV 86.1 02/03/2018 0250   MCH 28.7 02/03/2018 0250   MCHC 33.3 02/03/2018 0250   RDW 15.9 (H) 02/03/2018 0250   LYMPHSABS 0.7 12/22/2017 0210   MONOABS 0.4 12/22/2017 0210   EOSABS 0.0 12/22/2017 0210   BASOSABS 0.0 12/22/2017 0210    BMET    Component Value Date/Time   NA 135 02/03/2018 0250   K 4.2 02/03/2018 0250   CL 102 02/03/2018 0250   CO2 24 02/03/2018 0250   GLUCOSE 181 (H) 02/03/2018 0250   GLUCOSE 94 10/11/2006 1605   BUN 15 02/03/2018 0250   CREATININE 0.89 02/03/2018 0250   CALCIUM 8.7 (L) 02/03/2018 0250   GFRNONAA >60 02/03/2018 0250   GFRAA >60 02/03/2018 0250     Intake/Output Summary (Last 24 hours) at 02/03/2018 0719 Last data filed at 02/03/2018 0600 Gross per 24 hour  Intake 1640 ml  Output 98 ml  Net 1542 ml     Assessment/Plan:  This is a 84 y.o. male who is s/p right CEA 1 Day Post-Op  -pt is doing well this am. -pt neuro exam is in tact -JP drain with total of 70cc since surgery-only 40cc past 12 hours.  Will d/w Dr. Trula Slade -pt has ambulated -pt has voided -f/u with Dr. Trula Slade in 2-3 weeks.   Leontine Locket, PA-C [facility] [phone]  I agree with the above.  I have  seen and evaluated the patient.  He is postoperative day #1, status post right carotid endarterectomy for asymptomatic stenosis.  I left a JP in place because of oozing from all surface areas.  The drainage has significantly decreased.  He has no significant hematoma.  I am planning on removing his JP today.  He strongly wants to go home this afternoon.  If he does not have any evidence of recurrent hematoma, I feel it will be safe for him to go home this afternoon.  We instructed him to restart his Coumadin tomorrow  Annamarie Major

## 2018-02-03 NOTE — Discharge Summary (Signed)
Discharge Summary     CATARINO VOLD 21-Jan-1932 84 y.o. male  [id number]  Admission Date: 02/02/2018  Discharge Date: 02/03/18  Physician: Serafina Mitchell, MD  Admission Diagnosis: RIGHT CAROTID STENOSIS   HPI:   This is a 84 y.o. male who comes in today for follow-up.  He is status post endovascular repair of a abdominal aortic aneurysm.  He was recently admitted to the hospital with atrial fibrillation with RVR.  He underwent cardioversion.  During the stay he was found to have 80-99% right carotid stenosis.  He denies any carotid symptoms.  Specifically, he denies numbness or weakness in either extremity.  He denies slurred speech.  He denies amaurosis fugax.  The patient is on Coumadin for his atrial fibrillation.  He has been in normal sinus rhythm and feeling better.   Hospital Course:  The patient was admitted to the hospital and taken to the operating room on 02/02/2018 and underwent right carotid endarterectomy.  A JP drain was placed. The pt tolerated the procedure well and was transported to the PACU in good condition.   By POD 1, the pt neuro status was in tact.  He did have ~ 40cc bloody drainage out of his JP drain overnight.  Dr. Trula Slade ordered to removed this.  The pt was observed for a couple of hours before he was discharged.  He did well and was discharged home.  The remainder of the hospital course consisted of increasing mobilization and increasing intake of solids without difficulty.   Recent Labs    02/03/18 0250  NA 135  K 4.2  CL 102  CO2 24  GLUCOSE 181*  BUN 15  CALCIUM 8.7*   Recent Labs    02/03/18 0250  WBC 8.7  HGB 12.6*  HCT 37.8*  PLT 179   Recent Labs    02/02/18 0649  INR 1.13       Discharge Diagnosis:  RIGHT CAROTID STENOSIS  Secondary Diagnosis: Patient Active Problem List   Diagnosis Date Noted  . Asymptomatic carotid artery stenosis without infarction, right 02/02/2018  . Lung infiltrate 01/06/2018  . Acute  bronchiolitis due to human metapneumovirus 01/06/2018  . Acute on chronic combined systolic and diastolic CHF (congestive heart failure) ([place])   . Cough 12/19/2017  . Acute bronchitis 12/19/2017  . Solitary pulmonary nodule 03/23/2016  . Pulmonary nodule 12/25/2015  . Aspiration of foreign body   . Lung nodule < 6cm on CT   . Foreign body aspiration 12/23/2015  . Encounter for therapeutic drug monitoring 03/28/2015  . Chronic systolic CHF (congestive heart failure) ([place]) 06/12/2014  . Chronic diastolic CHF (congestive heart failure) ([place]) 04/23/2014  . Atrial fibrillation with rapid ventricular response ([place]) 04/20/2014  . AAA (abdominal aortic aneurysm) without rupture ([place]) 04/13/2014  . Occlusion and stenosis of carotid artery without mention of cerebral infarction 04/09/2014  . CAD (coronary artery disease) of artery bypass graft 09/14/2012  . Abdominal aortic aneurysm ([place]) 09/14/2012  . Peripheral vascular disease with claudication ([place]) 09/14/2012  . Carotid artery disease without cerebral infarction ([place]) 09/14/2012  . History of aortic valve replacement with bioprosthetic valve 09/14/2012  . Essential hypertension, malignant 09/14/2012  . Hyperlipidemia with target LDL less than 70 09/14/2012  . ABDOMINAL AORTIC ANEURYSM 07/06/2009  . CEREBROVASCULAR DISEASE, HX OF 07/06/2009   Past Medical History:  Diagnosis Date  . Atrial fibrillation ([place])   . Atrial fibrillation ([place])   . CAD (coronary artery disease)   . Cataract   .  CHF (congestive heart failure) ([place])   . Chronic kidney disease   . Colon polyps    adenomatous  . Diabetes mellitus   . Diverticulosis   . Dysrhythmia    A-Fib  . GERD (gastroesophageal reflux disease)   . Gout   . Hernia, abdominal   . History of aortic valve replacement with bioprosthetic valve    2007  . Hx: UTI (urinary tract infection)   . Hyperlipemia   . Hypertension   . Internal hemorrhoids   . Peripheral vascular disease ([place])   .  Shortness of breath    with exertion    Allergies as of 02/03/2018      Reactions   Milk-related Compounds    Just Yogurt.  Makes him very lethargic.      Medication List    TAKE these medications   allopurinol 300 MG tablet Commonly known as:  ZYLOPRIM Take 300 mg by mouth daily.   aspirin 81 MG EC tablet Take 1 tablet (81 mg total) by mouth daily.   atorvastatin 20 MG tablet Commonly known as:  LIPITOR Take 20 mg by mouth every other day. Alternating between 20 mg and 40 mg What changed:  Another medication with the same name was changed. Make sure you understand how and when to take each.   atorvastatin 40 MG tablet Commonly known as:  LIPITOR Take 1 tablet (40 mg total) by mouth every other day. What changed:  additional instructions   Co Q10 100 MG Caps Take 100 mg by mouth daily.   doxazosin 2 MG tablet Commonly known as:  CARDURA Take 2 mg by mouth daily. In the morning.   doxazosin 1 MG tablet Commonly known as:  CARDURA Take 1 mg by mouth at bedtime.   doxycycline 100 MG tablet Commonly known as:  VIBRA-TABS Take 1 tablet (100 mg total) by mouth 2 (two) times daily.   ezetimibe 10 MG tablet Commonly known as:  ZETIA Take 10 mg by mouth at bedtime.   finasteride 5 MG tablet Commonly known as:  PROSCAR Take 5 mg by mouth at bedtime.   furosemide 20 MG tablet Commonly known as:  LASIX Take 20 mg by mouth every other day. In the evening.   furosemide 40 MG tablet Commonly known as:  LASIX TAKE ONE TABLET ALTERNATING WITH 20MG  IN THE EVENING.   GLUCOSAMINE-MSM PO Take 1 tablet by mouth daily.   ipratropium-albuterol 0.5-2.5 (3) MG/3ML Soln Commonly known as:  DUONEB Take 3 mLs by nebulization every 6 (six) hours.   latanoprost 0.005 % ophthalmic solution Commonly known as:  XALATAN Instill 1 drop into the affected eye at bedtime   metoprolol succinate 25 MG 24 hr tablet Commonly known as:  TOPROL-XL Take 1 tablet (25 mg total) by mouth  daily.   OCUVITE EXTRA PO Take 1 tablet by mouth daily.   oxyCODONE-acetaminophen 5-325 MG tablet Commonly known as:  PERCOCET/ROXICET Take 1 tablet by mouth every 6 (six) hours as needed for moderate pain.   ramipril 5 MG capsule Commonly known as:  ALTACE Take 1 capsule (5 mg total) by mouth daily.   sitaGLIPtin 100 MG tablet Commonly known as:  JANUVIA Take 100 mg by mouth daily.   spironolactone 25 MG tablet Commonly known as:  ALDACTONE Take 1 tablet (25 mg total) by mouth at bedtime.   tamsulosin 0.4 MG Caps capsule Commonly known as:  FLOMAX Take 0.4 mg by mouth at bedtime.   timolol 0.5 % ophthalmic solution  Commonly known as:  TIMOPTIC Place 1 drop into the right eye daily.   VASCEPA 1 g Caps Generic drug:  Icosapent Ethyl Take 2 g by mouth 2 (two) times daily.   warfarin 3 MG tablet Commonly known as:  COUMADIN Take as directed. If you are unsure how to take this medication, talk to your nurse or doctor. Original instructions:  Take 2-3 tablets (6-9 mg total) by mouth See admin instructions. 6 mg at bedtime on Sun/Tues/Thurs/Sat and 3 mg on Mon/Wed/Fri What changed:    how much to take  how to take this  when to take this  additional instructions         [facility] Discharge Instructions Carotid Endarterectomy (CEA)  Please refer to the following instructions for your post-procedure care. Your surgeon or physician assistant will discuss any changes with you.  Activity  You are encouraged to walk as much as you can. You can slowly return to normal activities but must avoid strenuous activity and heavy lifting until your doctor tell you it's OK. Avoid activities such as vacuuming or swinging a golf club. You can drive after one week if you are comfortable and you are no longer taking prescription pain medications. It is normal to feel tired for serval weeks after your surgery. It is also normal to have difficulty with  sleep habits, eating, and bowel movements after surgery. These will go away with time.  Bathing/Showering  You may shower after you come home. Do not soak in a bathtub, hot tub, or swim until the incision heals completely.  Incision Care  Shower every day. Clean your incision with mild soap and water. Pat the area dry with a clean towel. You do not need a bandage unless otherwise instructed. Do not apply any ointments or creams to your incision. You may have skin glue on your incision. Do not peel it off. It will come off on its own in about one week. Your incision may feel thickened and raised for several weeks after your surgery. This is normal and the skin will soften over time. For Men Only: It's OK to shave around the incision but do not shave the incision itself for 2 weeks. It is common to have numbness under your chin that could last for several months.  Diet  Resume your normal diet. There are no special food restrictions following this procedure. A low fat/low cholesterol diet is recommended for all patients with vascular disease. In order to heal from your surgery, it is CRITICAL to get adequate nutrition. Your body requires vitamins, minerals, and protein. Vegetables are the best source of vitamins and minerals. Vegetables also provide the perfect balance of protein. Processed food has little nutritional value, so try to avoid this.  Medications  Resume taking all of your medications unless your doctor or physician assistant tells you not to.  If your incision is causing pain, you may take over-the- counter pain relievers such as acetaminophen (Tylenol). If you were prescribed a stronger pain medication, please be aware these medications can cause nausea and constipation.  Prevent nausea by taking the medication with a snack or meal. Avoid constipation by drinking plenty of fluids and eating foods with a high amount of fiber, such as fruits, vegetables, and grains. Do not take Tylenol if  you are taking prescription pain medications.  Follow Up  Our office will schedule a follow up appointment 2-3 weeks following discharge.  Please call us immediately for any  of the following conditions  Increased pain, redness, drainage (pus) from your incision site. Fever of 101 degrees or higher. If you should develop stroke (slurred speech, difficulty swallowing, weakness on one side of your body, loss of vision) you should call 911 and go to the nearest emergency room.  Reduce your risk of vascular disease:  Stop smoking. If you would like help call [facility] at 1-800-QUIT-NOW 806-522-0971) or [place] at (858)531-1063. Manage your cholesterol Maintain a desired weight Control your diabetes Keep your blood pressure down  If you have any questions, please call the office at [phone].  Prescriptions given: Roxicet #8 No Refill  Disposition: home  Patient's condition: is Good  Follow up: 1. Dr. Trula Slade in 2 weeks.   Leontine Locket, PA-C [facility] [phone]   --- For [facility] use ---   Modified Rankin score at D/C (0-6): 0  IV medication needed for:  1. Hypertension: No 2. Hypotension: No  Post-op Complications: No  1. Post-op CVA or TIA: No  If yes: Event classification (right eye, left eye, right cortical, left cortical, verterobasilar, other): n/a  If yes: Timing of event (intra-op, <6 hrs post-op, >=6 hrs post-op, unknown): n/a  2. CN injury: No  If yes: CN n/a injuried   3. Myocardial infarction: No  If yes: Dx by (EKG or clinical, Troponin): n/a  4.  CHF: No  5.  Dysrhythmia (new): No  6. Wound infection: No  7. Reperfusion symptoms: No  8. Return to OR: No  If yes: return to OR for (bleeding, neurologic, other CEA incision, other): n/a  Discharge medications: Statin use:  Yes ASA use:  Yes   Beta blocker use:  Yes ACE-Inhibitor use:  Yes  ARB use:  No CCB use: No P2Y12 Antagonist use: No, [ ]   Plavix, [ ]  Plasugrel, [ ]  Ticlopinine, [ ]  Ticagrelor, [ ]  Other, [ ]  No for medical reason, [ ]  Non-compliant, [ ]  Not-indicated Anti-coagulant use:  Yes, [ x] Warfarin, [ ]  Rivaroxaban, [ ]  Dabigatran,

## 2018-02-03 NOTE — Op Note (Signed)
Patient name: Nathaniel [name] MRN: [id number] DOB: 1932-07-09 Sex: male  02/02/2018 Pre-operative Diagnosis: Asymptomatic   right carotid stenosis Post-operative diagnosis:  Same Surgeon:  Annamarie Major Assistants: Leontine Locket Procedure:   #1: right carotid Endarterectomy with bovine pericardial patch angioplasty   #2: resection with primary anastomosis of the internal carotid artery Anesthesia:  General Blood Loss: 150 cc Specimens:  Carotid Plaque to pathology  Findings: 85 %stenosis; Thrombus: None. [place] drain was placed  Indications: The patient was found on surveillance imaging to have a greater than 80% right carotid stenosis.  He is asymptomatic.  He comes in today for endarterectomy.  He has previously had cardiac ablation for atrial fibrillation and has been maintained on Coumadin.  This was recently discontinued.  Procedure:  The patient was identified in the holding area and taken to [facility]  The patient was then placed supine on the table.   General endotrachial anesthesia was administered.  The patient was prepped and draped in the usual sterile fashion.  A time out was called and antibiotics were administered.  The incision was made along the anterior border of the right sternocleidomastoid muscle.  Cautery was used to dissect through the subcutaneous tissue.  The platysma muscle was divided with cautery.  The internal jugular vein was exposed along its anterior medial border.  The common facial vein was exposed and then divided between 2-0 silk ties and metal clips.  The common carotid artery was then circumferentially exposed and encircled with an umbilical tape.  The vagus nerve was identified and protected.  Next sharp dissection was used to expose the external carotid artery and the superior thyroid artery.  The were encircled with a blue vessel loop and a 2-0 silk tie respectively.  Finally, the internal carotid was carefully dissected free.  An umbilical tape was  placed around the internal carotid artery distal to the diseased segment.  The hypoglossal nerve was visualized throughout and protected.  The patient was given systemic heparinization.  A bovine carotid patch was selected and prepared on the back table.  A 10 french shunt was also prepared.  After blood pressure readings were appropriate and the heparin had been given time to circulate, the internal carotid artery was occluded with a baby Gregory clamp.  The external and common carotid arteries were then occluded with vascular clamps and the 2-0 tie tightened on the superior thyroid artery.  A #11 blade was used to make an arteriotomy in the common carotid artery.  This was extended with Potts scissors along the anterior and lateral border of the common and internal carotid artery.  Approximately 85% stenosis was identified.  There was no thrombus identified.  The 10 french shunt was not placed, as there was excellent backbleeding from the internal carotid artery.  A kleiner kuntz elevator was used to perform endarterectomy.  An eversion endarterectomy was performed in the external carotid artery. The specimen was removed.. I had a difficult time getting a good endpoint in the internal carotid artery.  It was somewhat redundant and therefore I elected to transect the internal carotid artery.  This was done by placing 4 stay sutures with 7-0 Prolene.  I resected approximately 1 cm of the internal carotid artery and then primarily closed the back wall with running 7-0 Prolene..    Heparinized saline was used to irrigate the endarterectomized field.  All potential embolic debris was removed.  Bovine pericardial patch angioplasty was then performed using a  running 6-0 Prolene. The common internal and external carotid arteries were all appropriately flushed. The artery was again irrigated with heparin saline.  The anastomosis was then secured. The clamp was first released on the external carotid artery followed by the  common carotid artery approximately 30 seconds later, bloodflow was reestablish through the internal carotid artery.  Next, a hand-held  Doppler was used to evaluate the signals in the common, external, and internal  carotid arteries, all of which had appropriate signals. I then administered  50 mg protamine. The wound was then irrigated.  The patient had a fair amount of oozing from the raw surface areas and therefore I elected to place a 15 Blake drain which was brought out through a separate incision.  The carotid sheath was reapproximated with 3-0 Vicryl. The  platysma muscle was reapproximated with running 3-0 Vicryl. The skin  was closed with 4-0 Vicryl. Dermabond was placed on the skin. The  patient was then successfully extubated. His neurologic exam was  similar to his preprocedural exam. The patient was then taken to recovery room  in stable condition. There were no complications.     Disposition:  To PACU in stable condition.  Relevant Operative Details: The carotid artery was found to be very deep within the neck.  The plaque extended approximately 2 cm into the internal carotid artery.  This was a very soft plaque which was very fragile.  There are multiple mobile pieces of plaque within the specimen.  I did not place a shunt as there was excellent backbleeding.  I had trouble getting a distal endpoint and therefore I elected to transect approximately 1 cm of the internal carotid artery and perform primary anastomosis so that I could have a good distal endpoint.  I spent a fair amount of time removing all potential embolic debris.  Once the debris was all removed, a bovine pericardial patch was used for patch angioplasty.  The patient had a fair amount of bleeding from the raw surface areas.  And therefore I elected to place a 15 Blake drain.  He awoke neurologically intact and was taken to recovery room in stable condition  V. Annamarie Major, M.D. [facility]  [place] Office: [phone] Pager:  [phone]

## 2018-02-03 NOTE — Discharge Instructions (Signed)
[facility]  Discharge Instructions   Carotid Endarterectomy (CEA)  Please refer to the following instructions for your post-procedure care. Your surgeon or physician assistant will discuss any changes with you.  Activity  You are encouraged to walk as much as you can. You can slowly return to normal activities but must avoid strenuous activity and heavy lifting until your doctor tell you it's okay. Avoid activities such as vacuuming or swinging a golf club. You can drive after one week if you are comfortable and you are no longer taking prescription pain medications. It is normal to feel tired for serval weeks after your surgery. It is also normal to have difficulty with sleep habits, eating, and bowel movements after surgery. These will go away with time.  Bathing/Showering  Shower daily after you go home. Do not soak in a bathtub, hot tub, or swim until the incision heals completely.  Incision Care  Shower every day. Clean your incision with mild soap and water. Pat the area dry with a clean towel. You do not need a bandage unless otherwise instructed. Do not apply any ointments or creams to your incision. You may have skin glue on your incision. Do not peel it off. It will come off on its own in about one week. Your incision may feel thickened and raised for several weeks after your surgery. This is normal and the skin will soften over time.   For Men Only: It's okay to shave around the incision but do not shave the incision itself for 2 weeks. It is common to have numbness under your chin that could last for several months.  Diet  Resume your normal diet. There are no special food restrictions following this procedure. A low fat/low cholesterol diet is recommended for all patients with vascular disease. In order to heal from your surgery, it is CRITICAL to get adequate nutrition. Your body requires vitamins, minerals, and protein. Vegetables are the  best source of vitamins and minerals. Vegetables also provide the perfect balance of protein. Processed food has little nutritional value, so try to avoid this.  Medications  Resume taking all of your medications unless your doctor or physician assistant tells you not to. If your incision is causing pain, you may take over-the- counter pain relievers such as acetaminophen (Tylenol). If you were prescribed a stronger pain medication, please be aware these medications can cause nausea and constipation. Prevent nausea by taking the medication with a snack or meal. Avoid constipation by drinking plenty of fluids and eating foods with a high amount of fiber, such as fruits, vegetables, and grains.  Do not take Tylenol if you are taking prescription pain medications.  RESTART YOUR COUMADIN ON 02/04/18.  FOLLOW UP NEXT WEEK TO HAVE YOUR INR CHECKED.   Follow Up  Our office will schedule a follow up appointment 2-3 weeks following discharge.  Please call us immediately for any of the following conditions  Increased pain, redness, drainage (pus) from your incision site. Fever of 101 degrees or higher. If you should develop stroke (slurred speech, difficulty swallowing, weakness on one side of your body, loss of vision) you should call 911 and go to the nearest emergency room.  Reduce your risk of vascular disease:  Stop smoking. If you would like help call [facility] at 1-800-QUIT-NOW 727-725-8232) or [place] at ([phone]. Manage your cholesterol Maintain a desired weight Control your diabetes Keep your blood pressure down  If you have any questions, please  call the office at [phone].

## 2018-02-04 DIAGNOSIS — I482 Chronic atrial fibrillation: Secondary | ICD-10-CM | POA: Diagnosis not present

## 2018-02-04 DIAGNOSIS — I2510 Atherosclerotic heart disease of native coronary artery without angina pectoris: Secondary | ICD-10-CM | POA: Diagnosis not present

## 2018-02-04 DIAGNOSIS — I5042 Chronic combined systolic (congestive) and diastolic (congestive) heart failure: Secondary | ICD-10-CM | POA: Diagnosis not present

## 2018-02-04 DIAGNOSIS — Z48812 Encounter for surgical aftercare following surgery on the circulatory system: Secondary | ICD-10-CM | POA: Diagnosis not present

## 2018-02-04 DIAGNOSIS — E1151 Type 2 diabetes mellitus with diabetic peripheral angiopathy without gangrene: Secondary | ICD-10-CM | POA: Diagnosis not present

## 2018-02-04 DIAGNOSIS — I110 Hypertensive heart disease with heart failure: Secondary | ICD-10-CM | POA: Diagnosis not present

## 2018-02-05 DIAGNOSIS — E1151 Type 2 diabetes mellitus with diabetic peripheral angiopathy without gangrene: Secondary | ICD-10-CM | POA: Diagnosis not present

## 2018-02-05 DIAGNOSIS — I5042 Chronic combined systolic (congestive) and diastolic (congestive) heart failure: Secondary | ICD-10-CM | POA: Diagnosis not present

## 2018-02-05 DIAGNOSIS — I2510 Atherosclerotic heart disease of native coronary artery without angina pectoris: Secondary | ICD-10-CM | POA: Diagnosis not present

## 2018-02-05 DIAGNOSIS — I482 Chronic atrial fibrillation: Secondary | ICD-10-CM | POA: Diagnosis not present

## 2018-02-05 DIAGNOSIS — Z48812 Encounter for surgical aftercare following surgery on the circulatory system: Secondary | ICD-10-CM | POA: Diagnosis not present

## 2018-02-05 DIAGNOSIS — I110 Hypertensive heart disease with heart failure: Secondary | ICD-10-CM | POA: Diagnosis not present

## 2018-02-07 ENCOUNTER — Ambulatory Visit (INDEPENDENT_AMBULATORY_CARE_PROVIDER_SITE_OTHER): Payer: Medicare Other | Admitting: Surgery

## 2018-02-07 ENCOUNTER — Telehealth: Payer: Self-pay | Admitting: *Deleted

## 2018-02-07 DIAGNOSIS — I2510 Atherosclerotic heart disease of native coronary artery without angina pectoris: Secondary | ICD-10-CM | POA: Diagnosis not present

## 2018-02-07 DIAGNOSIS — I110 Hypertensive heart disease with heart failure: Secondary | ICD-10-CM | POA: Diagnosis not present

## 2018-02-07 DIAGNOSIS — E1151 Type 2 diabetes mellitus with diabetic peripheral angiopathy without gangrene: Secondary | ICD-10-CM | POA: Diagnosis not present

## 2018-02-07 DIAGNOSIS — Z48812 Encounter for surgical aftercare following surgery on the circulatory system: Secondary | ICD-10-CM | POA: Diagnosis not present

## 2018-02-07 DIAGNOSIS — I6521 Occlusion and stenosis of right carotid artery: Secondary | ICD-10-CM

## 2018-02-07 DIAGNOSIS — I5042 Chronic combined systolic (congestive) and diastolic (congestive) heart failure: Secondary | ICD-10-CM | POA: Diagnosis not present

## 2018-02-07 DIAGNOSIS — I482 Chronic atrial fibrillation: Secondary | ICD-10-CM | POA: Diagnosis not present

## 2018-02-07 MED ORDER — CEPHALEXIN 500 MG PO CAPS: 500.0000 mg | ORAL_CAPSULE | Freq: Three times a day (TID) | ORAL | 0 refills | Status: DC

## 2018-02-07 NOTE — Progress Notes (Signed)
Patient name: Nathaniel [name] MRN: [id number] DOB: May 11, 1932 Sex: male  REASON FOR VISIT:     post op  HISTORY OF PRESENT ILLNESS:   Nathaniel [name] is a 84 y.o. male who returns today for an unscheduled visit.  On 02/02/2018 he underwent right carotid endarterectomy with bovine pericardial patch angioplasty as well as resection with primary anastomosis of a redundant internal carotid artery.  This was done for a asymptomatic 85% stenosis.  The patient is chronically on Coumadin for atrial fibrillation.  This was discontinued prior to his operation however he had significant oozing from all surface areas which required drain placement.  He was able to be discharged home the following day.  He did have a small hematoma.  He states that over the weekend he developed some redness from the area around the sternum.  He is not having any shortness of breath.  He states that his concerns are getting better today.  CURRENT MEDICATIONS:    Current Outpatient Medications  Medication Sig Dispense Refill  . allopurinol (ZYLOPRIM) 300 MG tablet Take 300 mg by mouth daily.   2  . aspirin EC 81 MG EC tablet Take 1 tablet (81 mg total) by mouth daily.    Marland Kitchen atorvastatin (LIPITOR) 20 MG tablet Take 20 mg by mouth every other day. Alternating between 20 mg and 40 mg    . atorvastatin (LIPITOR) 40 MG tablet Take 1 tablet (40 mg total) by mouth every other day.    . cephALEXin (KEFLEX) 500 MG capsule Take 1 capsule (500 mg total) by mouth 3 (three) times daily. 15 capsule 0  . Coenzyme Q10 (CO Q10) 100 MG CAPS Take 100 mg by mouth daily.    Marland Kitchen doxazosin (CARDURA) 1 MG tablet Take 1 mg by mouth at bedtime.    Marland Kitchen doxazosin (CARDURA) 2 MG tablet Take 2 mg by mouth daily. In the morning.    Marland Kitchen doxycycline (VIBRA-TABS) 100 MG tablet Take 1 tablet (100 mg total) by mouth 2 (two) times daily. 14 tablet 0  . ezetimibe (ZETIA) 10 MG tablet Take 10 mg by mouth at bedtime.     . finasteride  (PROSCAR) 5 MG tablet Take 5 mg by mouth at bedtime.     . furosemide (LASIX) 20 MG tablet Take 20 mg by mouth every other day. In the evening.    . furosemide (LASIX) 40 MG tablet TAKE ONE TABLET ALTERNATING WITH 20MG  IN THE EVENING. 45 tablet 6  . Glucosamine HCl-MSM (GLUCOSAMINE-MSM PO) Take 1 tablet by mouth daily.    [name] Ethyl (VASCEPA) 1 g CAPS Take 2 g by mouth 2 (two) times daily.    Marland Kitchen ipratropium-albuterol (DUONEB) 0.5-2.5 (3) MG/3ML SOLN Take 3 mLs by nebulization every 6 (six) hours. 360 mL 0  . latanoprost (XALATAN) 0.005 % ophthalmic solution Instill 1 drop into the affected eye at bedtime    . metoprolol succinate (TOPROL-XL) 25 MG 24 hr tablet Take 1 tablet (25 mg total) by mouth daily. (Patient not taking: Reported on 01/10/2018) 30 tablet 0  . Multiple Vitamins-Minerals (OCUVITE EXTRA PO) Take 1 tablet by mouth daily.    Marland Kitchen oxyCODONE-acetaminophen (PERCOCET/ROXICET) 5-325 MG tablet Take 1 tablet by mouth every 6 (six) hours as needed for moderate pain. 8 tablet 0  . ramipril (ALTACE) 5 MG capsule Take 1 capsule (5 mg total) by mouth daily. 90 capsule 3  . sitaGLIPtin (JANUVIA) 100 MG tablet Take 100 mg by mouth daily.     Marland Kitchen  spironolactone (ALDACTONE) 25 MG tablet Take 1 tablet (25 mg total) by mouth at bedtime.    . tamsulosin (FLOMAX) 0.4 MG CAPS capsule Take 0.4 mg by mouth at bedtime.     . timolol (TIMOPTIC) 0.5 % ophthalmic solution Place 1 drop into the right eye daily.   5  . warfarin (COUMADIN) 3 MG tablet Take 2-3 tablets (6-9 mg total) by mouth See admin instructions. 6 mg at bedtime on Sun/Tues/Thurs/Sat and 3 mg on Mon/Wed/Fri     No current facility-administered medications for this visit.     REVIEW OF SYSTEMS:   [X]  denotes positive finding, [ ]  denotes negative finding Cardiac  Comments:  Chest pain or chest pressure:    Shortness of breath upon exertion:    Short of breath when lying flat:    Irregular heart rhythm:    Constitutional    Fever or  chills:      PHYSICAL EXAM:   There were no vitals filed for this visit.  GENERAL: The patient is a well-nourished male, in no acute distress. The vital signs are documented above. CARDIOVASCULAR: There is a regular rate and rhythm. PULMONARY: Non-labored respirations Right neck hematoma which appears to be stable.  No erythema is noted.  No drainage from the drain exit site.  STUDIES:   None   MEDICAL ISSUES:   I am going to give him 5 days worth of Keflex for possible drain site infection although this is more prophylactic than anything else given his description of what occurred over the weekend.  I do not see any signs of infection currently.  He has a small to moderate size hematoma in the right neck.  This appears to be stable in appearance.  I suspect this will resolve over time.  He is going to follow-up with me at his regular scheduled postop visit in 2 weeks.  Annamarie Major, MD [facility] [phone] Pager [phone]

## 2018-02-07 NOTE — Telephone Encounter (Signed)
Patient called wanting neck wound checked s/p Right CEA. Saturday home health nurse was in to see him and called DR Donzetta Matters on Call for patient having swelling and redness at right neck that extended down neck to chest and chin. Denies any problem breathing or swallowing.  Was instructed to complete antibiotic and it has improved, but still present and worse in the morning. Instructed to follow up with office today and agreeable to see NP for wound check.

## 2018-02-09 ENCOUNTER — Ambulatory Visit: Payer: Self-pay | Admitting: *Deleted

## 2018-02-09 DIAGNOSIS — Z48812 Encounter for surgical aftercare following surgery on the circulatory system: Secondary | ICD-10-CM | POA: Diagnosis not present

## 2018-02-09 DIAGNOSIS — I110 Hypertensive heart disease with heart failure: Secondary | ICD-10-CM | POA: Diagnosis not present

## 2018-02-09 DIAGNOSIS — I482 Chronic atrial fibrillation: Secondary | ICD-10-CM | POA: Diagnosis not present

## 2018-02-09 DIAGNOSIS — E1151 Type 2 diabetes mellitus with diabetic peripheral angiopathy without gangrene: Secondary | ICD-10-CM | POA: Diagnosis not present

## 2018-02-09 DIAGNOSIS — I5042 Chronic combined systolic (congestive) and diastolic (congestive) heart failure: Secondary | ICD-10-CM | POA: Diagnosis not present

## 2018-02-09 DIAGNOSIS — I2510 Atherosclerotic heart disease of native coronary artery without angina pectoris: Secondary | ICD-10-CM | POA: Diagnosis not present

## 2018-02-11 DIAGNOSIS — I110 Hypertensive heart disease with heart failure: Secondary | ICD-10-CM | POA: Diagnosis not present

## 2018-02-11 DIAGNOSIS — Z48812 Encounter for surgical aftercare following surgery on the circulatory system: Secondary | ICD-10-CM | POA: Diagnosis not present

## 2018-02-11 DIAGNOSIS — I5042 Chronic combined systolic (congestive) and diastolic (congestive) heart failure: Secondary | ICD-10-CM | POA: Diagnosis not present

## 2018-02-11 DIAGNOSIS — I2510 Atherosclerotic heart disease of native coronary artery without angina pectoris: Secondary | ICD-10-CM | POA: Diagnosis not present

## 2018-02-11 DIAGNOSIS — I482 Chronic atrial fibrillation: Secondary | ICD-10-CM | POA: Diagnosis not present

## 2018-02-11 DIAGNOSIS — E1151 Type 2 diabetes mellitus with diabetic peripheral angiopathy without gangrene: Secondary | ICD-10-CM | POA: Diagnosis not present

## 2018-02-14 DIAGNOSIS — I5042 Chronic combined systolic (congestive) and diastolic (congestive) heart failure: Secondary | ICD-10-CM | POA: Diagnosis not present

## 2018-02-14 DIAGNOSIS — Z48812 Encounter for surgical aftercare following surgery on the circulatory system: Secondary | ICD-10-CM | POA: Diagnosis not present

## 2018-02-14 DIAGNOSIS — I482 Chronic atrial fibrillation: Secondary | ICD-10-CM | POA: Diagnosis not present

## 2018-02-14 DIAGNOSIS — I110 Hypertensive heart disease with heart failure: Secondary | ICD-10-CM | POA: Diagnosis not present

## 2018-02-14 DIAGNOSIS — E1151 Type 2 diabetes mellitus with diabetic peripheral angiopathy without gangrene: Secondary | ICD-10-CM | POA: Diagnosis not present

## 2018-02-14 DIAGNOSIS — I2510 Atherosclerotic heart disease of native coronary artery without angina pectoris: Secondary | ICD-10-CM | POA: Diagnosis not present

## 2018-02-15 ENCOUNTER — Other Ambulatory Visit: Payer: Self-pay | Admitting: *Deleted

## 2018-02-15 NOTE — Patient Outreach (Signed)
[place] [facility]) Care Management  02/15/2018  Nathaniel [name] [birth year] [id number]  EMMI-HF case referral. Case reassigned to this care coordinator. Per review no response to calls. No response to outreach letter.  Plan: Send to care management assistant for case closure.  Sherrin Daisy, RN BSN [place] Management Coordinator [facility] Care Management  [phone]

## 2018-02-17 DIAGNOSIS — Z48812 Encounter for surgical aftercare following surgery on the circulatory system: Secondary | ICD-10-CM | POA: Diagnosis not present

## 2018-02-17 DIAGNOSIS — I110 Hypertensive heart disease with heart failure: Secondary | ICD-10-CM | POA: Diagnosis not present

## 2018-02-17 DIAGNOSIS — I482 Chronic atrial fibrillation: Secondary | ICD-10-CM | POA: Diagnosis not present

## 2018-02-17 DIAGNOSIS — I2510 Atherosclerotic heart disease of native coronary artery without angina pectoris: Secondary | ICD-10-CM | POA: Diagnosis not present

## 2018-02-17 DIAGNOSIS — I5042 Chronic combined systolic (congestive) and diastolic (congestive) heart failure: Secondary | ICD-10-CM | POA: Diagnosis not present

## 2018-02-17 DIAGNOSIS — E1151 Type 2 diabetes mellitus with diabetic peripheral angiopathy without gangrene: Secondary | ICD-10-CM | POA: Diagnosis not present

## 2018-02-23 ENCOUNTER — Other Ambulatory Visit: Payer: Self-pay | Admitting: *Deleted

## 2018-02-23 DIAGNOSIS — I2510 Atherosclerotic heart disease of native coronary artery without angina pectoris: Secondary | ICD-10-CM | POA: Diagnosis not present

## 2018-02-23 DIAGNOSIS — I5042 Chronic combined systolic (congestive) and diastolic (congestive) heart failure: Secondary | ICD-10-CM | POA: Diagnosis not present

## 2018-02-23 DIAGNOSIS — Z48812 Encounter for surgical aftercare following surgery on the circulatory system: Secondary | ICD-10-CM | POA: Diagnosis not present

## 2018-02-23 DIAGNOSIS — I110 Hypertensive heart disease with heart failure: Secondary | ICD-10-CM | POA: Diagnosis not present

## 2018-02-23 DIAGNOSIS — E1151 Type 2 diabetes mellitus with diabetic peripheral angiopathy without gangrene: Secondary | ICD-10-CM | POA: Diagnosis not present

## 2018-02-23 DIAGNOSIS — I482 Chronic atrial fibrillation: Secondary | ICD-10-CM | POA: Diagnosis not present

## 2018-02-23 NOTE — Patient Outreach (Signed)
[place] [facility]) Care Management  02/23/2018  Nathaniel [name] Apr 15, 1932 [id number]  Patient with recent hospital encounter for planned procedure. Telephone to patient who was advised of reason for call & of [facility] care management services.  Patient voices that he is doing fine & has home health services in place and has appointment with them shortly. States he is aware of [facility] services & currently does not need [facility] services at this time. States he will call if he needs [facility] services. Contact number given.  Plan:  Close case/send to care management assistant.  Sherrin Daisy, RN BSN [place] Management Coordinator [facility] Care Management  [phone]

## 2018-02-26 DIAGNOSIS — I110 Hypertensive heart disease with heart failure: Secondary | ICD-10-CM | POA: Diagnosis not present

## 2018-02-26 DIAGNOSIS — I482 Chronic atrial fibrillation: Secondary | ICD-10-CM | POA: Diagnosis not present

## 2018-02-26 DIAGNOSIS — E1151 Type 2 diabetes mellitus with diabetic peripheral angiopathy without gangrene: Secondary | ICD-10-CM | POA: Diagnosis not present

## 2018-02-26 DIAGNOSIS — I2510 Atherosclerotic heart disease of native coronary artery without angina pectoris: Secondary | ICD-10-CM | POA: Diagnosis not present

## 2018-02-26 DIAGNOSIS — Z48812 Encounter for surgical aftercare following surgery on the circulatory system: Secondary | ICD-10-CM | POA: Diagnosis not present

## 2018-02-26 DIAGNOSIS — I5042 Chronic combined systolic (congestive) and diastolic (congestive) heart failure: Secondary | ICD-10-CM | POA: Diagnosis not present

## 2018-02-28 ENCOUNTER — Ambulatory Visit: Payer: Medicare Other | Admitting: Surgery

## 2018-02-28 ENCOUNTER — Other Ambulatory Visit (HOSPITAL_COMMUNITY): Payer: Medicare Other

## 2018-03-01 ENCOUNTER — Other Ambulatory Visit: Payer: Self-pay | Admitting: Cardiology

## 2018-03-01 ENCOUNTER — Ambulatory Visit (HOSPITAL_COMMUNITY)
Admission: RE | Admit: 2018-03-01 | Discharge: 2018-03-01 | Disposition: A | Discharge status: Home / Self Care | Payer: Medicare Other | Source: Ambulatory Visit | Attending: Cardiology | Admitting: Cardiology

## 2018-03-01 DIAGNOSIS — I25810 Atherosclerosis of coronary artery bypass graft(s) without angina pectoris: Secondary | ICD-10-CM | POA: Diagnosis not present

## 2018-03-01 LAB — LIPID PANEL
Cholesterol: 180 mg/dL (ref 0–200)
HDL: 45 mg/dL (ref >40)
LDL CALC: 86 mg/dL (ref 0–99)
TRIGLYCERIDES: 245 mg/dL — AB (ref <150)
Total CHOL/HDL Ratio: 4 RATIO
VLDL: 49 mg/dL — AB (ref 0–40)

## 2018-03-01 LAB — HEPATIC FUNCTION PANEL
ALK PHOS: 72 U/L (ref 38–126)
ALT: 15 U/L — ABNORMAL LOW (ref 17–63)
AST: 24 U/L (ref 15–41)
Albumin: 3.6 g/dL (ref 3.5–5.0)
BILIRUBIN DIRECT: 0.2 mg/dL (ref 0.1–0.5)
BILIRUBIN TOTAL: 0.7 mg/dL (ref 0.3–1.2)
Indirect Bilirubin: 0.5 mg/dL (ref 0.3–0.9)
Total Protein: 6.2 g/dL — ABNORMAL LOW (ref 6.5–8.1)

## 2018-03-02 ENCOUNTER — Telehealth (HOSPITAL_COMMUNITY): Payer: Self-pay

## 2018-03-02 NOTE — Telephone Encounter (Signed)
Notes recorded by Shirley Muscat, RN on 03/02/2018 at 10:04 AM EDT Pt refuses and will talk with Dr. Aundra Dubin at [facility] on 03/09/2018 ------  Notes recorded by Larey Dresser, MD on 03/01/2018 at 6:57 PM EDT LDL is still too high. Increase atorvastatin to 80 mg daily with lipids/LFTs in 2 months.

## 2018-03-07 DIAGNOSIS — H35373 Puckering of macula, bilateral: Secondary | ICD-10-CM | POA: Diagnosis not present

## 2018-03-07 DIAGNOSIS — Z961 Presence of intraocular lens: Secondary | ICD-10-CM | POA: Diagnosis not present

## 2018-03-07 DIAGNOSIS — H401122 Primary open-angle glaucoma, left eye, moderate stage: Secondary | ICD-10-CM | POA: Diagnosis not present

## 2018-03-09 ENCOUNTER — Ambulatory Visit (HOSPITAL_COMMUNITY)
Admission: RE | Admit: 2018-03-09 | Discharge: 2018-03-09 | Disposition: A | Discharge status: Home / Self Care | Payer: Medicare Other | Source: Ambulatory Visit | Attending: Cardiology | Admitting: Cardiology

## 2018-03-09 ENCOUNTER — Encounter (HOSPITAL_COMMUNITY): Payer: Self-pay | Admitting: Cardiology

## 2018-03-09 VITALS — BP 141/62 | HR 76 | Wt 200.0 lb

## 2018-03-09 DIAGNOSIS — Z951 Presence of aortocoronary bypass graft: Secondary | ICD-10-CM | POA: Diagnosis not present

## 2018-03-09 DIAGNOSIS — I5042 Chronic combined systolic (congestive) and diastolic (congestive) heart failure: Secondary | ICD-10-CM

## 2018-03-09 DIAGNOSIS — I714 Abdominal aortic aneurysm, without rupture, unspecified: Secondary | ICD-10-CM

## 2018-03-09 DIAGNOSIS — I25810 Atherosclerosis of coronary artery bypass graft(s) without angina pectoris: Secondary | ICD-10-CM

## 2018-03-09 DIAGNOSIS — N400 Enlarged prostate without lower urinary tract symptoms: Secondary | ICD-10-CM | POA: Insufficient documentation

## 2018-03-09 DIAGNOSIS — M109 Gout, unspecified: Secondary | ICD-10-CM | POA: Insufficient documentation

## 2018-03-09 DIAGNOSIS — Z7901 Long term (current) use of anticoagulants: Secondary | ICD-10-CM | POA: Insufficient documentation

## 2018-03-09 DIAGNOSIS — R918 Other nonspecific abnormal finding of lung field: Secondary | ICD-10-CM | POA: Insufficient documentation

## 2018-03-09 DIAGNOSIS — I480 Paroxysmal atrial fibrillation: Secondary | ICD-10-CM | POA: Insufficient documentation

## 2018-03-09 DIAGNOSIS — Z9889 Other specified postprocedural states: Secondary | ICD-10-CM | POA: Diagnosis not present

## 2018-03-09 DIAGNOSIS — F329 Major depressive disorder, single episode, unspecified: Secondary | ICD-10-CM | POA: Diagnosis not present

## 2018-03-09 DIAGNOSIS — Z48812 Encounter for surgical aftercare following surgery on the circulatory system: Secondary | ICD-10-CM | POA: Diagnosis not present

## 2018-03-09 DIAGNOSIS — I5022 Chronic systolic (congestive) heart failure: Secondary | ICD-10-CM | POA: Diagnosis not present

## 2018-03-09 DIAGNOSIS — I4891 Unspecified atrial fibrillation: Secondary | ICD-10-CM | POA: Diagnosis not present

## 2018-03-09 DIAGNOSIS — K219 Gastro-esophageal reflux disease without esophagitis: Secondary | ICD-10-CM | POA: Insufficient documentation

## 2018-03-09 DIAGNOSIS — I739 Peripheral vascular disease, unspecified: Secondary | ICD-10-CM | POA: Diagnosis not present

## 2018-03-09 DIAGNOSIS — I2510 Atherosclerotic heart disease of native coronary artery without angina pectoris: Secondary | ICD-10-CM | POA: Insufficient documentation

## 2018-03-09 DIAGNOSIS — E785 Hyperlipidemia, unspecified: Secondary | ICD-10-CM | POA: Diagnosis not present

## 2018-03-09 DIAGNOSIS — Z79899 Other long term (current) drug therapy: Secondary | ICD-10-CM | POA: Diagnosis not present

## 2018-03-09 DIAGNOSIS — I491 Atrial premature depolarization: Secondary | ICD-10-CM | POA: Diagnosis not present

## 2018-03-09 DIAGNOSIS — E119 Type 2 diabetes mellitus without complications: Secondary | ICD-10-CM | POA: Diagnosis not present

## 2018-03-09 DIAGNOSIS — I110 Hypertensive heart disease with heart failure: Secondary | ICD-10-CM | POA: Diagnosis not present

## 2018-03-09 DIAGNOSIS — I6529 Occlusion and stenosis of unspecified carotid artery: Secondary | ICD-10-CM | POA: Diagnosis not present

## 2018-03-09 DIAGNOSIS — Z953 Presence of xenogenic heart valve: Secondary | ICD-10-CM | POA: Insufficient documentation

## 2018-03-09 DIAGNOSIS — Z7982 Long term (current) use of aspirin: Secondary | ICD-10-CM | POA: Diagnosis not present

## 2018-03-09 LAB — BASIC METABOLIC PANEL
ANION GAP: 8 (ref 5–15)
BUN: 13 mg/dL (ref 6–20)
CHLORIDE: 104 mmol/L (ref 101–111)
CO2: 26 mmol/L (ref 22–32)
Calcium: 8.9 mg/dL (ref 8.9–10.3)
Creatinine, Ser: 0.82 mg/dL (ref 0.61–1.24)
GFR calc Af Amer: >60 mL/min (ref >60)
GFR calc non Af Amer: >60 mL/min (ref >60)
GLUCOSE: 148 mg/dL — AB (ref 65–99)
POTASSIUM: 4.3 mmol/L (ref 3.5–5.1)
Sodium: 138 mmol/L (ref 135–145)

## 2018-03-09 MED ORDER — ATORVASTATIN CALCIUM 40 MG PO TABS: 40.0000 mg | ORAL_TABLET | Freq: Every day | ORAL | 3 refills | Status: DC

## 2018-03-09 NOTE — Patient Instructions (Signed)
Increase Atorvastatin 40 mg (1 tab) daily  Labs drawn today (if we do not call you, then your lab work was stable)   Your physician recommends that you return for lab work in: 2 months labs  Your physician recommends that you schedule a follow-up appointment in: 3 months with Dr. Aundra Dubin

## 2018-03-09 NOTE — Progress Notes (Signed)
Patient ID: Nathaniel [name], male   DOB: 20-Sep-1932, 84 y.o.   MRN: [id number] PCP: Dr. Elyse Hsu Cardiology: Dr. Aundra Dubin  84 y.o. with history of extensive vascular disease including CAD s/p CABG and redo CABG, AAA s/p repair, PAD, and carotid stenosis as well as prior AVR presents for cardiology followup.   He has had documentation of significant peripheral arterial disease.  He gets bilateral calf soreness after walking for about 15 minutes.  This resolves with rest, no rest pain.  No pedal ulcers, though when he gets cuts on his feet, they heal slowly.  Peripheral arterial dopplers done in 10/14 showed > 50% focal SFA stenoses bilaterally.  He has at least moderate right common carotid stenosis that may be nearing surgical range.  No stroke-like symptoms.  He had a recent endovascular AAA repair in 5/15.    After his AAA repair, he was noted to be in atrial fibrillation.  He had had a prior episode of atrial fibrillation after his cardiac surgery in 2007.  Atrial fibrillation was rate-controlled.  He was started on warfarin and Toprol XL.  He developed exertional dyspnea and fatigue in atrial fibrillation. I cardioverted him back to NSR on 06/06/14.  I also had him get an echocardiogram in 6/15.  This showed EF worsened to 25-30% with stable bioprosthetic aortic valve, mildly dilated RV with normal systolic function.  Given the fall in EF, I took him for [facility] in 7/15.  This showed stable anatomy.  The LIMA-LAD was patent, the sequential SVG-OM/ramus/D was patent only to ramus but this is the same as the prior cath, and the SVG-PDA was patent with a long 50-60% mid-graft stenosis.  No intervention.  Of note, at cath he was noted to be back in atrial fibrillation.  I then started him on amiodarone.  I took Mr Schamp for [place] again on 08/21/14.  This was successful and he remains in NSR today.  He has been off amiodarone due to side effects (hallucinations).  Echo in 1/16 showed recovery of EF to 55-60%.    He was  admitted in 1/19 with bronchitis/wheezing/dyspnea and was found to be in atrial fibrillation with RVR.  He tested positive for metapneumovirus.  Echo showed EF 45-50% with normal bioprosthetic aortic valve. He was diuresed for volume overload while in the hospital. He had DCCV to NSR.    Carotid dopplers in 1/19 showed 80-99% RICA stenosis, he had right CEA in 2/99 with no complications.   He returns for followup of CHF and atrial fibrillation.  He is in NSR today.  His weight is up but he has been eating more and not exercising as much due to recent right CEA.  He stopped Toprol XL because it made him feel "depressed" when he took it.  No chest pain.  No significant exertional dyspnea.  Walking on flat ground and doing all his activities without dyspnea.  He has been the driving range to hit golf balls.    ECG: NSR, iLBBB (personally reviewed).   Labs (5/15): K 4.3, creatinine 0.89 Labs (6/15): K 4.3, creatinine 1.4, BNP 112 Labs (7/15): K 4.4, creatinine 1.2, HCT 40.2 Labs (8/15): K 4, creatinine 1.2, BNP 113, LFTs and TSH normal Labs (10/15): K 4.4, creatinine 1.4 Labs (5/16): K 4.7, creatinine 1.18, HDL 52, LDL 98 Labs (6/16): LDL 118, LFTs normal Labs (1/17): HCT 40.2, K 3.7, creatinine 1.04 Labs (1/19): K 4.9, creatinine 1.19 Labs (2/19): K 4.2, creatinine 0.89 Labs (3/19): LDL 86, HDL  45, TGs 245  PMH: 1. PAD: Peripheral arterial dopplers in 2012 showed > 75% bilateral SFA stenosis. Peripheral arterial dopplers in 10/14 showed > 50% focal bilateral SFA stenoses.  2. AAA: [place] 4/15 with 4.4 cm AAA but concern for penetrating ulcers. CTA abdomen showed 4.4 cm AAA with penetrating ulcers and possible pseudoaneurysms. Now s/p endovascular repair of AAA in 7/34 with no complications.  3. Carotid stenosis: Carotid dopplers (4/15) with 60-79% bilateral ICA stenosis. Carotid dopplers (11/15) with 60-79% bilateral ICA stenosis.  Carotid dopplers 6/16 with 60-79% bilateral ICA stenosis. - Carotid  dopplers (1/18): 60-79% RICA stenosis.  - Carotid dopplers (1/19): 80-99% RICA stenosis => Right CEA in 2/19.  4. CAD: CABG 1989 with LIMA-LAD, SVG-D, seq SVG-ramus and OM, sequential SVG-PDA/PLV. SVG-PDA/PLV found to be occluded on cath prior to AVR, so patient had SVG-PDA with AVR in 2007. LHC (7/15) with LIMA-LAD patent with 40-50% stenosis in LAD after touchdown, sequential SVG-ramus/OM/diagonal with only the ramus branch still intact (known from prior cath), SVG-PDA from original surgery TO, SVG-PDA from redo surgery with long 50-60% mid-graft stenosis.  No target for intervention.  5. Severe aortic stenosis: Bioprosthetic AVR in 2007. Echo (11/10) with EF 60-65%, basal to mid inferior hypokinesis, bioprosthetic aortic valve without significant stenosis/regurgitation, mild MR. Echo (11/14) with EF 55-60%, mild LVH, bioprosthetic aortic valve looked ok. Bioprosthetic valve looked ok on 6/15 echo.   6. Atrial fibrillation: Paroxysmal. Initially noted after cardiac surgery, then again after AAA repair.  DCCV to NSR 06/06/14.  DCCV to NSR again 08/21/14.  - DCCV to NSR in 1/19.  7. Type II diabetes  8. Gout  9. GERD  10. Hyperlipidemia  11. HTN 12. GERD  13. Chronic systolic CHF: Echo (1/93) with EF worsened to 25-30% with grade II diastolic dysfunction, stable bioprosthetic aortic valve, mildly dilated RV with normal systolic function.  Echo (1/16) with EF 55-60%, basal inferior hypokinesis, mild LVH, bioprosthetic aortic valve with mean gradient 13 mmHg, mild MR, severe LAE.  - Echo (1/19): EF 45-50%, mild MR, normal bioprosthetic aortic valve.  14. BPH  15. Lung nodules: Followed by pulmonary.   SH: Widower, 2 children, raised his 3 grand-daughters, lives in [place], [place].  Occasional ETOH, no smoking.   FH: Father died from ruptured AAA.   ROS: All systems reviewed and negative except as per HPI.   Current Outpatient Medications  Medication Sig Dispense Refill  . allopurinol  (ZYLOPRIM) 300 MG tablet Take 300 mg by mouth daily.   2  . aspirin EC 81 MG EC tablet Take 1 tablet (81 mg total) by mouth daily.    Marland Kitchen atorvastatin (LIPITOR) 40 MG tablet Take 1 tablet (40 mg total) by mouth daily. 30 tablet 3  . cephALEXin (KEFLEX) 500 MG capsule Take 1 capsule (500 mg total) by mouth 3 (three) times daily. 15 capsule 0  . Coenzyme Q10 (CO Q10) 100 MG CAPS Take 100 mg by mouth daily.    Marland Kitchen doxazosin (CARDURA) 1 MG tablet Take 1 mg by mouth at bedtime.    Marland Kitchen doxazosin (CARDURA) 2 MG tablet Take 2 mg by mouth daily. In the morning.    Marland Kitchen doxycycline (VIBRA-TABS) 100 MG tablet Take 1 tablet (100 mg total) by mouth 2 (two) times daily. 14 tablet 0  . ezetimibe (ZETIA) 10 MG tablet Take 10 mg by mouth at bedtime.     . finasteride (PROSCAR) 5 MG tablet Take 5 mg by mouth at bedtime.     Marland Kitchen  furosemide (LASIX) 20 MG tablet Take 20 mg by mouth every other day. In the evening.    . furosemide (LASIX) 20 MG tablet Take 2 tablets (40 mg total) by mouth daily AND 1 tablet (20 mg total) daily. Alternating. 45 tablet 11  . furosemide (LASIX) 40 MG tablet TAKE ONE TABLET ALTERNATING WITH 20MG  IN THE EVENING. 45 tablet 6  . Glucosamine HCl-MSM (GLUCOSAMINE-MSM PO) Take 1 tablet by mouth daily.    [name] Ethyl (VASCEPA) 1 g CAPS Take 2 g by mouth 2 (two) times daily.    Marland Kitchen ipratropium-albuterol (DUONEB) 0.5-2.5 (3) MG/3ML SOLN Take 3 mLs by nebulization every 6 (six) hours. 360 mL 0  . latanoprost (XALATAN) 0.005 % ophthalmic solution Instill 1 drop into the affected eye at bedtime    . Multiple Vitamins-Minerals (OCUVITE EXTRA PO) Take 1 tablet by mouth daily.    Marland Kitchen oxyCODONE-acetaminophen (PERCOCET/ROXICET) 5-325 MG tablet Take 1 tablet by mouth every 6 (six) hours as needed for moderate pain. 8 tablet 0  . ramipril (ALTACE) 2.5 MG capsule Take 2.5 mg by mouth every evening.    . ramipril (ALTACE) 5 MG capsule Take 1 capsule (5 mg total) by mouth daily. 90 capsule 3  . sitaGLIPtin (JANUVIA)  100 MG tablet Take 100 mg by mouth daily.     Marland Kitchen spironolactone (ALDACTONE) 25 MG tablet Take 1 tablet (25 mg total) by mouth at bedtime.    . tamsulosin (FLOMAX) 0.4 MG CAPS capsule Take 0.4 mg by mouth at bedtime.     . timolol (TIMOPTIC) 0.5 % ophthalmic solution Place 1 drop into the right eye daily.   5  . warfarin (COUMADIN) 3 MG tablet Take 2-3 tablets (6-9 mg total) by mouth See admin instructions. 6 mg at bedtime on Sun/Tues/Thurs/Sat and 3 mg on Mon/Wed/Fri     No current facility-administered medications for this encounter.     BP (!) 141/62   Pulse 76   Wt 200 lb (90.7 kg)   SpO2 96% Comment: RA  BMI 28.29 kg/m   General: NAD Neck: JVP 8 cm, no thyromegaly or thyroid nodule.  Lungs: Clear to auscultation bilaterally with normal respiratory effort. CV: Nondisplaced PMI.  Heart regular S1/S2, no S3/S4, 1/6 SEM RUSB.  1+ right ankle edema.  No carotid bruit.  Normal pedal pulses.  Abdomen: Soft, nontender, no hepatosplenomegaly, no distention.  Skin: Intact without lesions or rashes.  Neurologic: Alert and oriented x 3.  Psych: Normal affect. Extremities: No clubbing or cyanosis.  HEENT: Normal.   Assessment/Plan:  1. CAD: s/p CABG and redo CABG.  Given fall in EF, I did a cardiac cath in 7/15 as documented above. Unfortunately, despite significant coronary disease, there were no good interventional targets.  No chest pain.  - Continue statin.  - He is on warfarin in setting of stable CAD so not on ASA 81.  2. Hyperlipidemia: He is taking atorvastatin + Zetia.  Recent lipids showed LDL above goal.   - Increase atorvastatin to 40 mg daily with lipids/LFTs in 2 months.  - Continue Vascepa for triglycerides.    3. PAD: Patient denies claudication or foot ulcers. Peripheral arterial dopplers showed > 50% bilateral focal SFA stenoses in the past.  He is not a cilostazol candidate with CHF. He is followed by Dr. Trula Slade at [facility].  4. Carotid stenosis: S/p right CEA in 2/19.   Followed by Dr. Trula Slade at [facility].  5. AAA: Successful endovascular repair in 5/15.  6. Bioprosthetic AVR:  Valve stable on 1/19 echo.  7. Atrial fibrillation: Atrial fibrillation RVR likely triggered by bronchitis in 1/19.  He developed CHF in the setting of atrial fibrillation.  He had successful DCCV and is in NSR still today.  He is now off amiodarone given possible CNS side effects.  Tikosyn would be an option if he has a recurrence of atrial fibrillation.  He had to stop Toprol XL due to side effects.   8. Chronic primarily diastolic CHF: Suspect ischemic cardiomyopathy.  EF 25-30% in 6/15 but had normalized to 55-60% by 1/16.  Down to 45-50% on 1/19 echo but was in the setting of atrial fibrillation.  NYHA class II symptoms now, stable.  He is not volume overloaded on exam.  - Continue Lasix 40 mg daily alternating with 20 mg daily.   BMET today. - He was not able to tolerate low dose Toprol XL due to development of depression.  - Continue ramipril 5 qam/2.5 qpm.  - Continue spironolactone 25 mg daily.  9. HTN: BP acceptable, no changes.   10. Diabetes: Given CHF with tendency towards fluid retention, empagliflozin may be a better choice for DM treatment than sitagliptin.   Followup in 3 months.    [name] 03/09/2018

## 2018-03-10 ENCOUNTER — Ambulatory Visit (INDEPENDENT_AMBULATORY_CARE_PROVIDER_SITE_OTHER): Payer: Medicare Other | Admitting: *Deleted

## 2018-03-10 DIAGNOSIS — Z953 Presence of xenogenic heart valve: Secondary | ICD-10-CM | POA: Diagnosis not present

## 2018-03-10 DIAGNOSIS — I4891 Unspecified atrial fibrillation: Secondary | ICD-10-CM

## 2018-03-10 DIAGNOSIS — Z5181 Encounter for therapeutic drug level monitoring: Secondary | ICD-10-CM | POA: Diagnosis not present

## 2018-03-10 LAB — POCT INR: INR: 1.2

## 2018-03-10 MED ORDER — WARFARIN SODIUM 3 MG PO TABS: ORAL_TABLET | ORAL | 0 refills | Status: DC

## 2018-03-10 NOTE — Patient Instructions (Signed)
Description   Today March 28th  take 2.5 tablets and tomorrow march 29th take  3.5 tablets then continue taking 2 tablets everyday except 3 tablets on Mondays, Wednesdays and Fridays. Recheck in 1 week.   Call us with any new medications or concerns, [facility] # [phone], Main # [phone].

## 2018-03-11 ENCOUNTER — Other Ambulatory Visit (HOSPITAL_COMMUNITY): Payer: Self-pay

## 2018-03-11 MED ORDER — WARFARIN SODIUM 3 MG PO TABS: ORAL_TABLET | ORAL | 3 refills | Status: DC

## 2018-03-11 NOTE — Telephone Encounter (Signed)
Per Dr. Aundra Dubin  Pt needed refill on Coumadin....while assessing list pt has Asprin on med list. Spoke with Dr. Aundra Dubin and should stop Asprin, but there was no answer or VM.

## 2018-03-14 ENCOUNTER — Other Ambulatory Visit: Payer: Self-pay

## 2018-03-14 DIAGNOSIS — I714 Abdominal aortic aneurysm, without rupture, unspecified: Secondary | ICD-10-CM

## 2018-03-18 ENCOUNTER — Ambulatory Visit (INDEPENDENT_AMBULATORY_CARE_PROVIDER_SITE_OTHER): Payer: Medicare Other | Admitting: *Deleted

## 2018-03-18 DIAGNOSIS — Z5181 Encounter for therapeutic drug level monitoring: Secondary | ICD-10-CM | POA: Diagnosis not present

## 2018-03-18 DIAGNOSIS — I4891 Unspecified atrial fibrillation: Secondary | ICD-10-CM

## 2018-03-18 LAB — POCT INR: INR: 1.3

## 2018-03-18 NOTE — Patient Instructions (Signed)
Description   Today take 3.5 tablets and tomorrow take 2.5 tablets then start taking 3 tablets everyday except 2 tablets Tuesdays, Thursdays, and Saturdays.  Recheck in 1 week.  Call us with any new medications or concerns, [facility] # [phone], Main # [phone].

## 2018-03-21 ENCOUNTER — Ambulatory Visit (HOSPITAL_COMMUNITY)
Admission: RE | Admit: 2018-03-21 | Discharge: 2018-03-21 | Disposition: A | Discharge status: Home / Self Care | Payer: Medicare Other | Source: Ambulatory Visit | Attending: Surgery | Admitting: Surgery

## 2018-03-21 ENCOUNTER — Ambulatory Visit (INDEPENDENT_AMBULATORY_CARE_PROVIDER_SITE_OTHER): Payer: Medicare Other | Admitting: Surgery

## 2018-03-21 ENCOUNTER — Encounter: Payer: Self-pay | Admitting: Surgery

## 2018-03-21 VITALS — BP 139/74 | HR 72 | Resp 20 | Ht 70.5 in | Wt 200.0 lb

## 2018-03-21 DIAGNOSIS — I6521 Occlusion and stenosis of right carotid artery: Secondary | ICD-10-CM

## 2018-03-21 DIAGNOSIS — I6523 Occlusion and stenosis of bilateral carotid arteries: Secondary | ICD-10-CM

## 2018-03-21 DIAGNOSIS — I714 Abdominal aortic aneurysm, without rupture, unspecified: Secondary | ICD-10-CM

## 2018-03-21 NOTE — Progress Notes (Signed)
[facility] [place]  Patient name: Nathaniel [name] MRN: [id number] DOB: 1932-06-25 Sex: male   REASON FOR VISIT:    Follow up AAA and carotid  HISOTRY OF PRESENT ILLNESS:     Nathaniel [name] is a 84 y.o. male who is status post endovascular repair of an abdominal aortic aneurysm on 04/13/2014.  His postoperative course was complicated by atrial fibrillation, requiring anticoagulation.  Follow-up CT angiogram reveals the stent graft to be in good position with no evidence of endoleak.  I have been following him with ultrasound.  On 02/02/2018 he underwent right carotid endarterectomy with patch angioplasty, including resection with primary anastomosis of the internal carotid artery.  This was done for asymptomatic stenosis.  Intraoperative findings included a very soft plaque with mobile debris.  His postoperative course was uncomplicated.  He has no neurologic symptoms    PAST MEDICAL HISTORY:   Past Medical History:  Diagnosis Date  . AAA (abdominal aortic aneurysm) ([place])   . Atrial fibrillation ([place])   . Atrial fibrillation ([place])   . CAD (coronary artery disease)   . Cataract   . CHF (congestive heart failure) ([place])   . Chronic kidney disease   . Colon polyps    adenomatous  . Diabetes mellitus   . Diverticulosis   . Dysrhythmia    A-Fib  . GERD (gastroesophageal reflux disease)   . Gout   . Hernia, abdominal   . History of aortic valve replacement with bioprosthetic valve    2007  . Hx: UTI (urinary tract infection)   . Hyperlipemia   . Hypertension   . Internal hemorrhoids   . Peripheral vascular disease ([place])   . Shortness of breath    with exertion     FAMILY HISTORY:   Family History  Problem Relation Age of Onset  . Heart disease Mother   . Heart disease Father        before age 23  . AAA (abdominal aortic aneurysm) Father   . Colon cancer Neg Hx     SOCIAL HISTORY:   Social History    Tobacco Use  . Smoking status: Former Smoker    Packs/day: 0.25    Years: 20.00    Pack years: 5.00    Types: Cigarettes    Last attempt to quit: 03/23/1992    Years since quitting: 26.0  . Smokeless tobacco: Never Used  Substance Use Topics  . Alcohol use: Yes    Alcohol/week: 6.0 oz    Types: 10 Glasses of wine per week    Comment: 1-2 drinks daily      ALLERGIES:   Allergies  Allergen Reactions  . Milk-Related Compounds     Just Yogurt.  Makes him very lethargic.     CURRENT MEDICATIONS:   Current Outpatient Medications  Medication Sig Dispense Refill  . allopurinol (ZYLOPRIM) 300 MG tablet Take 300 mg by mouth daily.   2  . aspirin EC 81 MG EC tablet Take 1 tablet (81 mg total) by mouth daily.    Marland Kitchen atorvastatin (LIPITOR) 40 MG tablet Take 1 tablet (40 mg total) by mouth daily. 30 tablet 3  . Coenzyme Q10 (CO Q10) 100 MG CAPS Take 100 mg by mouth daily.    Marland Kitchen doxazosin (CARDURA) 1 MG tablet Take 1 mg by mouth at bedtime.    Marland Kitchen doxazosin (CARDURA) 2 MG tablet Take 2 mg by mouth daily. In the morning.    . ezetimibe (ZETIA) 10  MG tablet Take 10 mg by mouth at bedtime.     . finasteride (PROSCAR) 5 MG tablet Take 5 mg by mouth at bedtime.     . furosemide (LASIX) 20 MG tablet Take 20 mg by mouth [name] other day. In the evening.    . furosemide (LASIX) 20 MG tablet Take 2 tablets (40 mg total) by mouth daily AND 1 tablet (20 mg total) daily. Alternating. 45 tablet 11  . furosemide (LASIX) 40 MG tablet TAKE ONE TABLET ALTERNATING WITH 20MG  IN THE EVENING. 45 tablet 6  . Glucosamine HCl-MSM (GLUCOSAMINE-MSM PO) Take 1 tablet by mouth daily.    Marland Kitchen latanoprost (XALATAN) 0.005 % ophthalmic solution Instill 1 drop into the affected eye at bedtime    . Multiple Vitamins-Minerals (OCUVITE EXTRA PO) Take 1 tablet by mouth daily.    . ramipril (ALTACE) 2.5 MG capsule Take 2.5 mg by mouth [name] evening.    . ramipril (ALTACE) 5 MG capsule Take 1 capsule (5 mg total) by mouth daily.  90 capsule 3  . sitaGLIPtin (JANUVIA) 100 MG tablet Take 100 mg by mouth daily.     Marland Kitchen spironolactone (ALDACTONE) 25 MG tablet Take 1 tablet (25 mg total) by mouth at bedtime.    . tamsulosin (FLOMAX) 0.4 MG CAPS capsule Take 0.4 mg by mouth at bedtime.     . timolol (TIMOPTIC) 0.5 % ophthalmic solution Place 1 drop into the right eye daily.   5  . warfarin (COUMADIN) 3 MG tablet Take as directed by coumadin clinic 80 tablet 3  . cephALEXin (KEFLEX) 500 MG capsule Take 1 capsule (500 mg total) by mouth 3 (three) times daily. (Patient not taking: Reported on 03/21/2018) 15 capsule 0  . Icosapent Ethyl (VASCEPA) 1 g CAPS Take 2 g by mouth 2 (two) times daily.    Marland Kitchen ipratropium-albuterol (DUONEB) 0.5-2.5 (3) MG/3ML SOLN Take 3 mLs by nebulization [name] 6 (six) hours. (Patient not taking: Reported on 03/21/2018) 360 mL 0  . oxyCODONE-acetaminophen (PERCOCET/ROXICET) 5-325 MG tablet Take 1 tablet by mouth [name] 6 (six) hours as needed for moderate pain. (Patient not taking: Reported on 03/21/2018) 8 tablet 0   No current facility-administered medications for this visit.     REVIEW OF SYSTEMS:   [X]  denotes positive finding, [ ]  denotes negative finding Cardiac  Comments:  Chest pain or chest pressure:    Shortness of breath upon exertion:    Short of breath when lying flat:    Irregular heart rhythm:        Vascular    Pain in calf, thigh, or hip brought on by ambulation:    Pain in feet at night that wakes you up from your sleep:     Blood clot in your veins:    Leg swelling:         Pulmonary    Oxygen at home:    Productive cough:     Wheezing:         Neurologic    Sudden weakness in arms or legs:     Sudden numbness in arms or legs:     Sudden onset of difficulty speaking or slurred speech:    Temporary loss of vision in one eye:     Problems with dizziness:         Gastrointestinal    Blood in stool:     Vomited blood:         Genitourinary    Burning when urinating:  Blood  in urine:        Psychiatric    Major depression:         Hematologic    Bleeding problems:    Problems with blood clotting too easily:        Skin    Rashes or ulcers:        Constitutional    Fever or chills:      PHYSICAL EXAM:   Vitals:   03/21/18 0856  BP: 139/74  Pulse: 72  Resp: 20  SpO2: 95%  Weight: 200 lb (90.7 kg)  Height: 5' 10.5" (1.791 m)    GENERAL: The patient is a well-nourished male, in no acute distress. The vital signs are documented above. CARDIAC: There is a regular rate and rhythm.  VASCULAR: Right carotid incision is well-healed.  There is no bruit PULMONARY: Non-labored respirations ABDOMEN: Soft and non-tender   MUSCULOSKELETAL: There are no major deformities or cyanosis. NEUROLOGIC: No focal weakness or paresthesias are detected. SKIN: There are no ulcers or rashes noted. PSYCHIATRIC: The patient has a normal affect.  STUDIES:   I have ordered and reviewed the following studies: AAA ultrasound: Maximum aortic diameter is 4.2 cm.  There is no evidence of endoleak.  MEDICAL ISSUES:   AAA:  Follow up in 6 months with duplex  Carotid:  Follow up in 6 months with duplex    Annamarie Major, MD [facility] [phone] Pager [phone]

## 2018-03-25 ENCOUNTER — Ambulatory Visit (INDEPENDENT_AMBULATORY_CARE_PROVIDER_SITE_OTHER): Payer: Medicare Other | Admitting: *Deleted

## 2018-03-25 DIAGNOSIS — Z953 Presence of xenogenic heart valve: Secondary | ICD-10-CM | POA: Diagnosis not present

## 2018-03-25 DIAGNOSIS — I4891 Unspecified atrial fibrillation: Secondary | ICD-10-CM

## 2018-03-25 DIAGNOSIS — Z5181 Encounter for therapeutic drug level monitoring: Secondary | ICD-10-CM

## 2018-03-25 LAB — POCT INR: INR: 2.3

## 2018-03-25 NOTE — Patient Instructions (Signed)
Description   Continue  taking 3 tablets everyday except 2 tablets Tuesdays, Thursdays, and Saturdays.  Recheck in 2 weeks.  Call us with any new medications or concerns, [facility]madin Clinic # [phone], Main # [phone].

## 2018-03-31 DIAGNOSIS — E782 Mixed hyperlipidemia: Secondary | ICD-10-CM | POA: Diagnosis not present

## 2018-03-31 DIAGNOSIS — E1165 Type 2 diabetes mellitus with hyperglycemia: Secondary | ICD-10-CM | POA: Diagnosis not present

## 2018-04-01 ENCOUNTER — Other Ambulatory Visit (HOSPITAL_COMMUNITY): Payer: Self-pay | Admitting: *Deleted

## 2018-04-01 MED ORDER — ATORVASTATIN CALCIUM 40 MG PO TABS: 40.0000 mg | ORAL_TABLET | Freq: Every day | ORAL | 2 refills | Status: DC

## 2018-04-05 DIAGNOSIS — E782 Mixed hyperlipidemia: Secondary | ICD-10-CM | POA: Diagnosis not present

## 2018-04-05 DIAGNOSIS — E1159 Type 2 diabetes mellitus with other circulatory complications: Secondary | ICD-10-CM | POA: Diagnosis not present

## 2018-04-05 DIAGNOSIS — N139 Obstructive and reflux uropathy, unspecified: Secondary | ICD-10-CM | POA: Diagnosis not present

## 2018-04-05 DIAGNOSIS — E1151 Type 2 diabetes mellitus with diabetic peripheral angiopathy without gangrene: Secondary | ICD-10-CM | POA: Diagnosis not present

## 2018-04-05 DIAGNOSIS — I2510 Atherosclerotic heart disease of native coronary artery without angina pectoris: Secondary | ICD-10-CM | POA: Diagnosis not present

## 2018-04-05 DIAGNOSIS — E1165 Type 2 diabetes mellitus with hyperglycemia: Secondary | ICD-10-CM | POA: Diagnosis not present

## 2018-04-05 DIAGNOSIS — I1310 Hypertensive heart and chronic kidney disease without heart failure, with stage 1 through stage 4 chronic kidney disease, or unspecified chronic kidney disease: Secondary | ICD-10-CM | POA: Diagnosis not present

## 2018-04-05 DIAGNOSIS — N183 Chronic kidney disease, stage 3 (moderate): Secondary | ICD-10-CM | POA: Diagnosis not present

## 2018-04-05 DIAGNOSIS — Z7984 Long term (current) use of oral hypoglycemic drugs: Secondary | ICD-10-CM | POA: Diagnosis not present

## 2018-04-05 DIAGNOSIS — M109 Gout, unspecified: Secondary | ICD-10-CM | POA: Diagnosis not present

## 2018-04-07 ENCOUNTER — Ambulatory Visit (INDEPENDENT_AMBULATORY_CARE_PROVIDER_SITE_OTHER): Payer: Medicare Other | Admitting: *Deleted

## 2018-04-07 DIAGNOSIS — Z5181 Encounter for therapeutic drug level monitoring: Secondary | ICD-10-CM | POA: Diagnosis not present

## 2018-04-07 DIAGNOSIS — Z953 Presence of xenogenic heart valve: Secondary | ICD-10-CM

## 2018-04-07 DIAGNOSIS — I4891 Unspecified atrial fibrillation: Secondary | ICD-10-CM | POA: Diagnosis not present

## 2018-04-07 LAB — POCT INR: INR: 1.5

## 2018-04-07 NOTE — Patient Instructions (Addendum)
Description   Pt took 2 tablets last night April 24th then states took 1 tablet this morning so instructed to take 2 and 1/2 tablets today April 25th then  Tomorrow April 26th  take 3 and 1/2 tablets then   continue  taking 3 tablets everyday except 2 tablets Tuesdays, Thursdays, and Saturdays.  Recheck in 2 weeks.  Call us with any new medications or concerns, [facility] # [phone], Main # [phone].

## 2018-04-21 ENCOUNTER — Telehealth (HOSPITAL_COMMUNITY): Payer: Self-pay

## 2018-04-21 ENCOUNTER — Ambulatory Visit (INDEPENDENT_AMBULATORY_CARE_PROVIDER_SITE_OTHER): Payer: Medicare Other | Admitting: *Deleted

## 2018-04-21 DIAGNOSIS — Z5181 Encounter for therapeutic drug level monitoring: Secondary | ICD-10-CM

## 2018-04-21 DIAGNOSIS — I4891 Unspecified atrial fibrillation: Secondary | ICD-10-CM

## 2018-04-21 DIAGNOSIS — Z953 Presence of xenogenic heart valve: Secondary | ICD-10-CM

## 2018-04-21 LAB — POCT INR: INR: 1.4

## 2018-04-21 NOTE — Telephone Encounter (Signed)
Advanced Heart Failure Triage Encounter  Patient Name: Nathaniel [name]  Date of Call: 04/21/18  Problem:  Pt left VM stating that he will not take Lipitor 40 mg, Per last OV. I will send him in a Rx for 20 mg.   Plan:    Shirley Muscat, RN

## 2018-04-21 NOTE — Patient Instructions (Signed)
Description   Today May 9th take 2 and 1/2 tablets (7.5mg ) then tomorrow May 10th 3 and 1/2 tablets then change  dose of coumadin to   3 tablets everyday except 2 tablets only on  Tuesdays and Thursdays.  Recheck in 2 weeks.  Call us with any new medications or concerns, [facility] # [phone], Main # [phone].

## 2018-04-21 NOTE — Telephone Encounter (Signed)
Atorvastatin 20 has not been adequate.  Can he just switch over to Crestor 10 mg daily? Less myalgias with Crestor.

## 2018-04-21 NOTE — Telephone Encounter (Signed)
Discussed earlier with pt. Pt refuses Crestor as well.

## 2018-04-25 ENCOUNTER — Other Ambulatory Visit: Payer: Self-pay

## 2018-04-25 DIAGNOSIS — I6523 Occlusion and stenosis of bilateral carotid arteries: Secondary | ICD-10-CM

## 2018-04-25 DIAGNOSIS — I714 Abdominal aortic aneurysm, without rupture, unspecified: Secondary | ICD-10-CM

## 2018-04-25 DIAGNOSIS — I6521 Occlusion and stenosis of right carotid artery: Secondary | ICD-10-CM

## 2018-05-04 ENCOUNTER — Other Ambulatory Visit (HOSPITAL_COMMUNITY): Payer: Self-pay | Admitting: Cardiology

## 2018-05-05 ENCOUNTER — Ambulatory Visit (INDEPENDENT_AMBULATORY_CARE_PROVIDER_SITE_OTHER): Payer: Medicare Other | Admitting: *Deleted

## 2018-05-05 DIAGNOSIS — I4891 Unspecified atrial fibrillation: Secondary | ICD-10-CM | POA: Diagnosis not present

## 2018-05-05 DIAGNOSIS — Z5181 Encounter for therapeutic drug level monitoring: Secondary | ICD-10-CM

## 2018-05-05 LAB — POCT INR: INR: 2.2 (ref 2.0–3.0)

## 2018-05-05 NOTE — Patient Instructions (Signed)
Description   Continue taking  3 tablets everyday except 2 tablets only on Tuesdays and Thursdays.  Recheck in 3 weeks.  Call us with any new medications or concerns, [facility] # [phone], Main # [phone].

## 2018-05-10 ENCOUNTER — Other Ambulatory Visit (HOSPITAL_COMMUNITY): Payer: Medicare Other

## 2018-05-10 ENCOUNTER — Ambulatory Visit (HOSPITAL_COMMUNITY)
Admission: RE | Admit: 2018-05-10 | Discharge: 2018-05-10 | Disposition: A | Discharge status: Home / Self Care | Payer: Medicare Other | Source: Ambulatory Visit | Attending: Cardiology | Admitting: Cardiology

## 2018-05-10 DIAGNOSIS — I714 Abdominal aortic aneurysm, without rupture, unspecified: Secondary | ICD-10-CM

## 2018-05-10 LAB — LIPID PANEL
CHOL/HDL RATIO: 4.8 ratio
CHOLESTEROL: 187 mg/dL (ref 0–200)
HDL: 39 mg/dL — ABNORMAL LOW (ref >40)
LDL Cholesterol: 110 mg/dL — ABNORMAL HIGH (ref 0–99)
TRIGLYCERIDES: 190 mg/dL — AB (ref <150)
VLDL: 38 mg/dL (ref 0–40)

## 2018-05-10 LAB — HEPATIC FUNCTION PANEL
ALBUMIN: 3.8 g/dL (ref 3.5–5.0)
ALT: 15 U/L — ABNORMAL LOW (ref 17–63)
AST: 26 U/L (ref 15–41)
Alkaline Phosphatase: 53 U/L (ref 38–126)
Bilirubin, Direct: <0.1 mg/dL — ABNORMAL LOW (ref 0.1–0.5)
Total Bilirubin: 1.3 mg/dL — ABNORMAL HIGH (ref 0.3–1.2)
Total Protein: 6.7 g/dL (ref 6.5–8.1)

## 2018-05-13 ENCOUNTER — Telehealth (HOSPITAL_COMMUNITY): Payer: Self-pay

## 2018-05-13 NOTE — Telephone Encounter (Signed)
Dr. Aundra Dubin  Made aware  Notes recorded by Shirley Muscat, RN on 05/12/2018 at 4:38 PM EDT Pt still refuse to change medication doses still taking Lipitor 20 mg (1 tab) daily. Pt is agitated with wanting to increase medication. I will mail him out lipid letter. He did not want to discuss any further lab work or appts. ------  Notes recorded by Larey Dresser, MD on 05/10/2018 at 4:16 PM EDT LDL is still too high, goal LDL < 70 to prevent heart attack and decrease mortality risk. He has been reticent to increase statin in the past. If he is taking atorvastatin 40 mg daily, would he be willing to increase to 80 mg daily with lipids/LFTs in 2 months. If he is not taking atorvastatin 40 mg daily, needs to start back on it.

## 2018-05-26 ENCOUNTER — Ambulatory Visit (INDEPENDENT_AMBULATORY_CARE_PROVIDER_SITE_OTHER): Payer: Medicare Other | Admitting: *Deleted

## 2018-05-26 DIAGNOSIS — Z953 Presence of xenogenic heart valve: Secondary | ICD-10-CM | POA: Diagnosis not present

## 2018-05-26 DIAGNOSIS — I4891 Unspecified atrial fibrillation: Secondary | ICD-10-CM | POA: Diagnosis not present

## 2018-05-26 DIAGNOSIS — Z5181 Encounter for therapeutic drug level monitoring: Secondary | ICD-10-CM

## 2018-05-26 LAB — POCT INR: INR: 1.7 — AB (ref 2.0–3.0)

## 2018-05-26 NOTE — Patient Instructions (Signed)
Description   Today take 2 and 1/2 tablets (7.5mg ) then continue taking  3 tablets everyday except 2 tablets only on Tuesdays and Thursdays.  Recheck in 2 weeks.  Call us with any new medications or concerns, [facility] # [phone], Main # [phone].

## 2018-05-31 ENCOUNTER — Other Ambulatory Visit (HOSPITAL_COMMUNITY): Payer: Self-pay | Admitting: Cardiology

## 2018-05-31 DIAGNOSIS — I739 Peripheral vascular disease, unspecified: Secondary | ICD-10-CM

## 2018-05-31 DIAGNOSIS — I5022 Chronic systolic (congestive) heart failure: Secondary | ICD-10-CM

## 2018-05-31 DIAGNOSIS — I480 Paroxysmal atrial fibrillation: Secondary | ICD-10-CM

## 2018-05-31 DIAGNOSIS — I779 Disorder of arteries and arterioles, unspecified: Secondary | ICD-10-CM

## 2018-06-03 ENCOUNTER — Ambulatory Visit (HOSPITAL_COMMUNITY)
Admission: RE | Admit: 2018-06-03 | Discharge: 2018-06-03 | Disposition: A | Discharge status: Home / Self Care | Payer: Medicare Other | Source: Ambulatory Visit | Attending: Cardiology | Admitting: Cardiology

## 2018-06-03 VITALS — BP 124/76 | HR 83 | Wt 201.0 lb

## 2018-06-03 DIAGNOSIS — Z79899 Other long term (current) drug therapy: Secondary | ICD-10-CM | POA: Diagnosis not present

## 2018-06-03 DIAGNOSIS — Z953 Presence of xenogenic heart valve: Secondary | ICD-10-CM | POA: Diagnosis not present

## 2018-06-03 DIAGNOSIS — I110 Hypertensive heart disease with heart failure: Secondary | ICD-10-CM | POA: Diagnosis not present

## 2018-06-03 DIAGNOSIS — K219 Gastro-esophageal reflux disease without esophagitis: Secondary | ICD-10-CM | POA: Insufficient documentation

## 2018-06-03 DIAGNOSIS — I714 Abdominal aortic aneurysm, without rupture: Secondary | ICD-10-CM | POA: Diagnosis not present

## 2018-06-03 DIAGNOSIS — I480 Paroxysmal atrial fibrillation: Secondary | ICD-10-CM | POA: Diagnosis not present

## 2018-06-03 DIAGNOSIS — I2510 Atherosclerotic heart disease of native coronary artery without angina pectoris: Secondary | ICD-10-CM | POA: Diagnosis not present

## 2018-06-03 DIAGNOSIS — I25810 Atherosclerosis of coronary artery bypass graft(s) without angina pectoris: Secondary | ICD-10-CM | POA: Diagnosis not present

## 2018-06-03 DIAGNOSIS — E119 Type 2 diabetes mellitus without complications: Secondary | ICD-10-CM | POA: Diagnosis not present

## 2018-06-03 DIAGNOSIS — Z79891 Long term (current) use of opiate analgesic: Secondary | ICD-10-CM | POA: Insufficient documentation

## 2018-06-03 DIAGNOSIS — R918 Other nonspecific abnormal finding of lung field: Secondary | ICD-10-CM | POA: Diagnosis not present

## 2018-06-03 DIAGNOSIS — Z951 Presence of aortocoronary bypass graft: Secondary | ICD-10-CM | POA: Insufficient documentation

## 2018-06-03 DIAGNOSIS — I4891 Unspecified atrial fibrillation: Secondary | ICD-10-CM | POA: Diagnosis not present

## 2018-06-03 DIAGNOSIS — Z7901 Long term (current) use of anticoagulants: Secondary | ICD-10-CM | POA: Diagnosis not present

## 2018-06-03 DIAGNOSIS — I5022 Chronic systolic (congestive) heart failure: Secondary | ICD-10-CM | POA: Diagnosis not present

## 2018-06-03 DIAGNOSIS — M109 Gout, unspecified: Secondary | ICD-10-CM | POA: Diagnosis not present

## 2018-06-03 DIAGNOSIS — N400 Enlarged prostate without lower urinary tract symptoms: Secondary | ICD-10-CM | POA: Insufficient documentation

## 2018-06-03 DIAGNOSIS — I739 Peripheral vascular disease, unspecified: Secondary | ICD-10-CM

## 2018-06-03 DIAGNOSIS — I5032 Chronic diastolic (congestive) heart failure: Secondary | ICD-10-CM | POA: Diagnosis not present

## 2018-06-03 DIAGNOSIS — E785 Hyperlipidemia, unspecified: Secondary | ICD-10-CM | POA: Diagnosis not present

## 2018-06-03 DIAGNOSIS — I6529 Occlusion and stenosis of unspecified carotid artery: Secondary | ICD-10-CM | POA: Diagnosis not present

## 2018-06-03 DIAGNOSIS — I779 Disorder of arteries and arterioles, unspecified: Secondary | ICD-10-CM | POA: Diagnosis not present

## 2018-06-03 LAB — CBC
HEMATOCRIT: 44.4 % (ref 39.0–52.0)
HEMOGLOBIN: 14.2 g/dL (ref 13.0–17.0)
MCH: 29.5 pg (ref 26.0–34.0)
MCHC: 32 g/dL (ref 30.0–36.0)
MCV: 92.3 fL (ref 78.0–100.0)
Platelets: 190 10*3/uL (ref 150–400)
RBC: 4.81 MIL/uL (ref 4.22–5.81)
RDW: 13 % (ref 11.5–15.5)
WBC: 6.2 10*3/uL (ref 4.0–10.5)

## 2018-06-03 LAB — BASIC METABOLIC PANEL
ANION GAP: 8 (ref 5–15)
BUN: 15 mg/dL (ref 6–20)
CHLORIDE: 103 mmol/L (ref 101–111)
CO2: 27 mmol/L (ref 22–32)
Calcium: 9.1 mg/dL (ref 8.9–10.3)
Creatinine, Ser: 1.07 mg/dL (ref 0.61–1.24)
GFR calc Af Amer: >60 mL/min (ref >60)
GLUCOSE: 177 mg/dL — AB (ref 65–99)
POTASSIUM: 4.1 mmol/L (ref 3.5–5.1)
Sodium: 138 mmol/L (ref 135–145)

## 2018-06-03 MED ORDER — FINASTERIDE 5 MG PO TABS: 5.0000 mg | ORAL_TABLET | Freq: Every day | ORAL | 3 refills | Status: DC

## 2018-06-03 MED ORDER — FUROSEMIDE 40 MG PO TABS: ORAL_TABLET | ORAL | 3 refills | Status: DC

## 2018-06-03 MED ORDER — SPIRONOLACTONE 25 MG PO TABS: 25.0000 mg | ORAL_TABLET | Freq: Every day | ORAL | Status: DC

## 2018-06-03 NOTE — Patient Instructions (Signed)
Stop Asprin  You have been referred to Lipid Clinic (they will call you)   Labs drawn today (if we do not call you, then your lab work was stable)   Your physician recommends that you schedule a follow-up appointment in: 4 months with Dr. Aundra Dubin  Please Call an Schedule Appointment  (call in August)

## 2018-06-04 NOTE — Progress Notes (Signed)
Patient ID: Nathaniel [name], male   DOB: 01-16-1932, 84 y.o.   MRN: [id number] PCP: Dr. Elyse Hsu Cardiology: Dr. Aundra Dubin  84 y.o. with history of extensive vascular disease including CAD s/p CABG and redo CABG, AAA s/p repair, PAD, and carotid stenosis as well as prior AVR presents for cardiology followup.   He has had documentation of significant peripheral arterial disease.  He gets bilateral calf soreness after walking for about 15 minutes.  This resolves with rest, no rest pain.  No pedal ulcers, though when he gets cuts on his feet, they heal slowly.  Peripheral arterial dopplers done in 10/14 showed > 50% focal SFA stenoses bilaterally.  He has at least moderate right common carotid stenosis that may be nearing surgical range.  No stroke-like symptoms.  He had a recent endovascular AAA repair in 5/15.    After his AAA repair, he was noted to be in atrial fibrillation.  He had had a prior episode of atrial fibrillation after his cardiac surgery in 2007.  Atrial fibrillation was rate-controlled.  He was started on warfarin and Toprol XL.  He developed exertional dyspnea and fatigue in atrial fibrillation. I cardioverted him back to NSR on 06/06/14.  I also had him get an echocardiogram in 6/15.  This showed EF worsened to 25-30% with stable bioprosthetic aortic valve, mildly dilated RV with normal systolic function.  Given the fall in EF, I took him for [facility] in 7/15.  This showed stable anatomy.  The LIMA-LAD was patent, the sequential SVG-OM/ramus/D was patent only to ramus but this is the same as the prior cath, and the SVG-PDA was patent with a long 50-60% mid-graft stenosis.  No intervention.  Of note, at cath he was noted to be back in atrial fibrillation.  I then started him on amiodarone.  I took [facility] again on 08/21/14.  This was successful and he remains in NSR today.  He has been off amiodarone due to side effects (hallucinations).  Echo in 1/16 showed recovery of EF to 55-60%.    He was  admitted in 1/19 with bronchitis/wheezing/dyspnea and was found to be in atrial fibrillation with RVR.  He tested positive for metapneumovirus.  Echo showed EF 45-50% with normal bioprosthetic aortic valve. He was diuresed for volume overload while in the hospital. He had DCCV to NSR.    Carotid dopplers in 1/19 showed 80-99% RICA stenosis, he had right CEA in 0/24 with no complications.   He returns for followup of CHF and atrial fibrillation.  He is in NSR today.  He has been doing pretty well symptomatically.  No palpitations.  No dyspnea walking on flat ground, able to play golf without problems.  No chest pain.  He cannot take more than atorvastatin 20 mg daily alternating with 10 mg daily or he has intractable cramps. Crestor caused depression in the past and he is not willing to try it again.   ECG: NSR, iLBBB, shallow inferior TWIs (personally reviewed).   Labs (5/15): K 4.3, creatinine 0.89 Labs (6/15): K 4.3, creatinine 1.4, BNP 112 Labs (7/15): K 4.4, creatinine 1.2, HCT 40.2 Labs (8/15): K 4, creatinine 1.2, BNP 113, LFTs and TSH normal Labs (10/15): K 4.4, creatinine 1.4 Labs (5/16): K 4.7, creatinine 1.18, HDL 52, LDL 98 Labs (6/16): LDL 118, LFTs normal Labs (1/17): HCT 40.2, K 3.7, creatinine 1.04 Labs (1/19): K 4.9, creatinine 1.19 Labs (2/19): K 4.2, creatinine 0.89 Labs (3/19): LDL 86, HDL 45, TGs 245  Labs (5/19): LDL 110, HDL 39  PMH: 1. PAD: Peripheral arterial dopplers in 2012 showed > 75% bilateral SFA stenosis. Peripheral arterial dopplers in 10/14 showed > 50% focal bilateral SFA stenoses.  2. AAA: [place] 4/15 with 4.4 cm AAA but concern for penetrating ulcers. CTA abdomen showed 4.4 cm AAA with penetrating ulcers and possible pseudoaneurysms. Now s/p endovascular repair of AAA in 9/67 with no complications.  3. Carotid stenosis: Carotid dopplers (4/15) with 60-79% bilateral ICA stenosis. Carotid dopplers (11/15) with 60-79% bilateral ICA stenosis.  Carotid dopplers 6/16  with 60-79% bilateral ICA stenosis. - Carotid dopplers (1/18): 60-79% RICA stenosis.  - Carotid dopplers (1/19): 80-99% RICA stenosis => Right CEA in 2/19.  4. CAD: CABG 1989 with LIMA-LAD, SVG-D, seq SVG-ramus and OM, sequential SVG-PDA/PLV. SVG-PDA/PLV found to be occluded on cath prior to AVR, so patient had SVG-PDA with AVR in 2007. LHC (7/15) with LIMA-LAD patent with 40-50% stenosis in LAD after touchdown, sequential SVG-ramus/OM/diagonal with only the ramus branch still intact (known from prior cath), SVG-PDA from original surgery TO, SVG-PDA from redo surgery with long 50-60% mid-graft stenosis.  No target for intervention.  5. Severe aortic stenosis: Bioprosthetic AVR in 2007. Echo (11/10) with EF 60-65%, basal to mid inferior hypokinesis, bioprosthetic aortic valve without significant stenosis/regurgitation, mild MR. Echo (11/14) with EF 55-60%, mild LVH, bioprosthetic aortic valve looked ok. Bioprosthetic valve looked ok on 6/15 echo.   6. Atrial fibrillation: Paroxysmal. Initially noted after cardiac surgery, then again after AAA repair.  DCCV to NSR 06/06/14.  DCCV to NSR again 08/21/14.  - DCCV to NSR in 1/19.  7. Type II diabetes  8. Gout  9. GERD  10. Hyperlipidemia  11. HTN 12. GERD  13. Chronic systolic CHF: Echo (8/93) with EF worsened to 25-30% with grade II diastolic dysfunction, stable bioprosthetic aortic valve, mildly dilated RV with normal systolic function.  Echo (1/16) with EF 55-60%, basal inferior hypokinesis, mild LVH, bioprosthetic aortic valve with mean gradient 13 mmHg, mild MR, severe LAE.  - Echo (1/19): EF 45-50%, mild MR, normal bioprosthetic aortic valve.  14. BPH  15. Lung nodules: Followed by pulmonary.   SH: Widower, 2 children, raised his 3 grand-daughters, lives in [place], [place].  Occasional ETOH, no smoking.   FH: Father died from ruptured AAA.   ROS: All systems reviewed and negative except as per HPI.   Current Outpatient Medications   Medication Sig Dispense Refill  . allopurinol (ZYLOPRIM) 300 MG tablet Take 300 mg by mouth daily.   2  . atorvastatin (LIPITOR) 40 MG tablet Take 1 tablet (40 mg total) by mouth daily. 90 tablet 2  . Coenzyme Q10 (CO Q10) 100 MG CAPS Take 100 mg by mouth daily.    Marland Kitchen doxazosin (CARDURA) 1 MG tablet Take 1 mg by mouth at bedtime.    Marland Kitchen doxazosin (CARDURA) 2 MG tablet Take 2 mg by mouth daily. In the morning.    . ezetimibe (ZETIA) 10 MG tablet Take 10 mg by mouth at bedtime.     . finasteride (PROSCAR) 5 MG tablet Take 1 tablet (5 mg total) by mouth at bedtime. 90 tablet 3  . furosemide (LASIX) 40 MG tablet Take 40 mg, alternating with 20 mg daily 90 tablet 3  . Glucosamine HCl-MSM (GLUCOSAMINE-MSM PO) Take 1 tablet by mouth daily.    [name] Ethyl (VASCEPA) 1 g CAPS Take 2 g by mouth 2 (two) times daily.    Marland Kitchen latanoprost (XALATAN) 0.005 % ophthalmic solution  Instill 1 drop into the affected eye at bedtime    . Multiple Vitamins-Minerals (OCUVITE EXTRA PO) Take 1 tablet by mouth daily.    . ramipril (ALTACE) 2.5 MG capsule Take 2.5 mg by mouth every evening.    . ramipril (ALTACE) 5 MG capsule Take 1 capsule (5 mg total) by mouth daily. 90 capsule 3  . sitaGLIPtin (JANUVIA) 100 MG tablet Take 100 mg by mouth daily.     Marland Kitchen spironolactone (ALDACTONE) 25 MG tablet Take 1 tablet (25 mg total) by mouth at bedtime. 90 tablet   . tamsulosin (FLOMAX) 0.4 MG CAPS capsule Take 0.4 mg by mouth at bedtime.     . timolol (TIMOPTIC) 0.5 % ophthalmic solution Place 1 drop into the right eye daily.   5  . warfarin (COUMADIN) 3 MG tablet Take as directed by coumadin clinic 80 tablet 3  . cephALEXin (KEFLEX) 500 MG capsule Take 1 capsule (500 mg total) by mouth 3 (three) times daily. (Patient not taking: Reported on 03/21/2018) 15 capsule 0  . ipratropium-albuterol (DUONEB) 0.5-2.5 (3) MG/3ML SOLN Take 3 mLs by nebulization every 6 (six) hours. (Patient not taking: Reported on 03/21/2018) 360 mL 0  .  oxyCODONE-acetaminophen (PERCOCET/ROXICET) 5-325 MG tablet Take 1 tablet by mouth every 6 (six) hours as needed for moderate pain. (Patient not taking: Reported on 03/21/2018) 8 tablet 0   No current facility-administered medications for this encounter.     BP 124/76   Pulse 83   Wt 201 lb (91.2 kg)   SpO2 95%   BMI 28.43 kg/m   General: NAD Neck: No JVD, no thyromegaly or thyroid nodule.  Lungs: Clear to auscultation bilaterally with normal respiratory effort. CV: Nondisplaced PMI.  Heart regular S1/S2, no S3/S4, no murmur.  No peripheral edema.  No carotid bruit.  Difficult to palpate pedal pulses.  Abdomen: Soft, nontender, no hepatosplenomegaly, no distention.  Skin: Intact without lesions or rashes.  Neurologic: Alert and oriented x 3.  Psych: Normal affect. Extremities: No clubbing or cyanosis.  HEENT: Normal.   Assessment/Plan:  1. CAD: s/p CABG and redo CABG.  Given fall in EF, I did a cardiac cath in 7/15 as documented above. Unfortunately, despite significant coronary disease, there were no good interventional targets.  No chest pain.  - Continue statin, he does not tolerate increase though LDL is not ideal.  - He is on warfarin in setting of stable CAD so can stop ASA 81. CBC today.  2. Hyperlipidemia: He is taking atorvastatin + Zetia.  Recent lipids showed LDL above goal.  He cannot tolerate an increase in atorvastatin and he has not tolerated Crestor in the past.  - Continue current atorvastatin and Zetia. - Continue Vascepa for triglycerides.    - I will refer him to lipid clinic for [place].  3. PAD: Patient denies claudication or foot ulcers. Peripheral arterial dopplers showed > 50% bilateral focal SFA stenoses in the past.  He is not a cilostazol candidate with CHF. He is followed by Dr. Trula Slade at [facility].  4. Carotid stenosis: S/p right CEA in 2/19.  Followed by Dr. Trula Slade at [facility].  5. AAA: Successful endovascular repair in 5/15.  6. Bioprosthetic AVR: Valve stable on  1/19 echo.  7. Atrial fibrillation: Atrial fibrillation RVR likely triggered by bronchitis in 1/19.  He developed CHF in the setting of atrial fibrillation.  He had successful DCCV and is in NSR still today.  He is now off amiodarone given possible CNS side effects.  Tikosyn would be an option if he has a recurrence of atrial fibrillation.  He had to stop Toprol XL due to side effects.  NSR today.  8. Chronic primarily diastolic CHF: Suspect ischemic cardiomyopathy.  EF 25-30% in 6/15 but had normalized to 55-60% by 1/16.  Down to 45-50% on 1/19 echo but was in the setting of atrial fibrillation.  NYHA class II symptoms now, stable.  He is not volume overloaded on exam.  - Continue Lasix 40 mg daily alternating with 20 mg daily.   BMET today. - He was not able to tolerate low dose Toprol XL due to development of depression.  - Continue ramipril 5 qam/2.5 qpm.  - Continue spironolactone 25 mg daily.  9. HTN: BP acceptable, no changes.   10. Diabetes: Given CHF with tendency towards fluid retention, empagliflozin may be a better choice for DM treatment than sitagliptin.   Followup in 4 months.    [name] 06/04/2018

## 2018-06-10 ENCOUNTER — Ambulatory Visit (INDEPENDENT_AMBULATORY_CARE_PROVIDER_SITE_OTHER): Payer: Medicare Other | Admitting: Pharmacist

## 2018-06-10 DIAGNOSIS — I4891 Unspecified atrial fibrillation: Secondary | ICD-10-CM | POA: Diagnosis not present

## 2018-06-10 DIAGNOSIS — Z5181 Encounter for therapeutic drug level monitoring: Secondary | ICD-10-CM | POA: Diagnosis not present

## 2018-06-10 LAB — POCT INR: INR: 1.3 — AB (ref 2.0–3.0)

## 2018-06-10 NOTE — Patient Instructions (Signed)
Description   Take 4 tablets today and tomorrow, then start taking 3 tablets daily except 2 tablets on Tuesdays. Recheck in 1 week.  Call us with any new medications or concerns, [facility] # [phone], Main # [phone].

## 2018-06-20 ENCOUNTER — Ambulatory Visit (INDEPENDENT_AMBULATORY_CARE_PROVIDER_SITE_OTHER): Payer: Medicare Other | Admitting: Pharmacist

## 2018-06-20 DIAGNOSIS — I714 Abdominal aortic aneurysm, without rupture, unspecified: Secondary | ICD-10-CM

## 2018-06-20 DIAGNOSIS — I4891 Unspecified atrial fibrillation: Secondary | ICD-10-CM | POA: Diagnosis not present

## 2018-06-20 DIAGNOSIS — Z5181 Encounter for therapeutic drug level monitoring: Secondary | ICD-10-CM

## 2018-06-20 LAB — POCT INR: INR: 2.1 (ref 2.0–3.0)

## 2018-06-20 NOTE — Patient Instructions (Signed)
Description   Continue 3 tablets daily except 2 tablets on Tuesdays. Recheck in 2 week.  Call us with any new medications or concerns, [facility] # [phone], Main # [phone].

## 2018-06-23 ENCOUNTER — Ambulatory Visit: Payer: Medicare Other

## 2018-07-05 ENCOUNTER — Other Ambulatory Visit: Payer: Self-pay | Admitting: Cardiology

## 2018-07-05 ENCOUNTER — Encounter: Payer: Self-pay | Admitting: Pharmacist

## 2018-07-05 ENCOUNTER — Ambulatory Visit (INDEPENDENT_AMBULATORY_CARE_PROVIDER_SITE_OTHER): Payer: Medicare Other | Admitting: Pharmacist

## 2018-07-05 DIAGNOSIS — E785 Hyperlipidemia, unspecified: Secondary | ICD-10-CM

## 2018-07-05 DIAGNOSIS — I6523 Occlusion and stenosis of bilateral carotid arteries: Secondary | ICD-10-CM | POA: Diagnosis not present

## 2018-07-05 DIAGNOSIS — Z5181 Encounter for therapeutic drug level monitoring: Secondary | ICD-10-CM | POA: Diagnosis not present

## 2018-07-05 DIAGNOSIS — I4891 Unspecified atrial fibrillation: Secondary | ICD-10-CM

## 2018-07-05 LAB — POCT INR: INR: 1.1 — AB (ref 2.0–3.0)

## 2018-07-05 MED ORDER — COUMADIN 3 MG PO TABS: ORAL_TABLET | ORAL | 1 refills | Status: DC

## 2018-07-05 NOTE — Patient Instructions (Addendum)
We will send for coverage of Repatha through your insurance and patient assistance foundation.   Please call our office if you have any questions or concerns. [phone].    Cholesterol Cholesterol is a fat. Your body needs a small amount of cholesterol. Cholesterol (plaque) may build up in your blood vessels (arteries). That makes you more likely to have a heart attack or stroke. You cannot feel your cholesterol level. Having a blood test is the only way to find out if your level is high. Keep your test results. Work with your doctor to keep your cholesterol at a good level. What do the results mean?  Total cholesterol is how much cholesterol is in your blood.  LDL is bad cholesterol. This is the type that can build up. Try to have low LDL.  HDL is good cholesterol. It cleans your blood vessels and carries LDL away. Try to have high HDL.  Triglycerides are fat that the body can store or burn for energy. What are good levels of cholesterol?  Total cholesterol below 200.  LDL below 100 is good for people who have health risks. LDL below 70 is good for people who have very high risks.  HDL above 40 is good. It is best to have HDL of 60 or higher.  Triglycerides below 150. How can I lower my cholesterol? Diet Follow your diet program as told by your doctor.  Choose fish, white meat chicken, or [place] that is roasted or baked. Try not to eat red meat, fried foods, sausage, or lunch meats.  Eat lots of fresh fruits and vegetables.  Choose whole grains, beans, pasta, potatoes, and cereals.  Choose olive oil, corn oil, or canola oil. Only use small amounts.  Try not to eat butter, mayonnaise, shortening, or palm kernel oils.  Try not to eat foods with trans fats.  Choose low-fat or nonfat dairy foods. ? Drink skim or nonfat milk. ? Eat low-fat or nonfat yogurt and cheeses. ? Try not to drink whole milk or cream. ? Try not to eat ice cream, egg yolks, or full-fat  cheeses.  Healthy desserts include angel food cake, ginger snaps, animal crackers, hard candy, popsicles, and low-fat or nonfat frozen yogurt. Try not to eat pastries, cakes, pies, and cookies.  Exercise Follow your exercise program as told by your doctor.  Be more active. Try gardening, walking, and taking the stairs.  Ask your doctor about ways that you can be more active.  Medicine  Take over-the-counter and prescription medicines only as told by your doctor. This information is not intended to replace advice given to you by your health care provider. Make sure you discuss any questions you have with your health care provider. Document Released: 02/26/2009 Document Revised: 07/01/2016 Document Reviewed: 06/11/2016 Elsevier Interactive Patient Education  [employer].

## 2018-07-05 NOTE — Patient Instructions (Addendum)
Description   Take 3 tablets today and 4 tablets tomorrow then continue 3 tablets daily except 2 tablets on Tuesdays. Recheck in 10 days.  Call us with any new medications or concerns, Cou[facility]in Clinic # [phone], Main # [phone].

## 2018-07-05 NOTE — Progress Notes (Signed)
Patient ID: Nathaniel [name].                 DOB: 05/18/1932                    MRN: [id number]     HPI: Nathaniel [name]. is a 84 y.o. male patient of Dr. Aundra Dubin that presents today for lipid evaluation.  PMH includes extensive vascular disease including CAD s/p CABG and redo CABG, AAA s/p repair, PAD, and carotid stenosis as well as prior AVR. He has had difficulty tolerating increased doses of atorvastatin and rosuvastatin previously.   He presents today for discussion of cholesterol/PCSK9i therapy. He states when he increased atorvastatin he felt so lethargic that he would nap all day. He states he would rather not live if he has to feel that way. He is tolerating his current dose well.  Patient has not started Jardiance yet but plans to do so. He has stopped Januvia though.    Risk Factors: CAD s/p CABG, PAD LDL Goal: <70, nonHDL <100  Current Medications: atorvastatin 20mg  alternating with 30mg  daily, ezetimibe 10mg  daily, Vascepa 2g BID Intolerances: higher doses of atorvastatin (intractable cramps) and rosuvastatin (depression)  Diet: has decreased soda, caffeine, and alcohol.   Exercise: golfs regularly  Family History: Father died from ruptured AAA.  Social History: has a glass of champagne every day, no smoking.  Labs: 05/10/18: TC 187, TG 190, HDL 39, LDL 110 (atorvastatin 20mg /30mg  daily, ezetimibe 10mg  daily, Vascepa 2g BID)  Past Medical History:  Diagnosis Date  . AAA (abdominal aortic aneurysm) ([place])   . Atrial fibrillation ([place])   . Atrial fibrillation ([place])   . CAD (coronary artery disease)   . Cataract   . CHF (congestive heart failure) ([place])   . Chronic kidney disease   . Colon polyps    adenomatous  . Diabetes mellitus   . Diverticulosis   . Dysrhythmia    A-Fib  . GERD (gastroesophageal reflux disease)   . Gout   . Hernia, abdominal   . History of aortic valve replacement with bioprosthetic valve    2007  . Hx: UTI (urinary tract infection)   .  Hyperlipemia   . Hypertension   . Internal hemorrhoids   . Peripheral vascular disease ([place])   . Shortness of breath    with exertion    Current Outpatient Medications on File Prior to Visit  Medication Sig Dispense Refill  . allopurinol (ZYLOPRIM) 300 MG tablet Take 300 mg by mouth daily.   2  . atorvastatin (LIPITOR) 40 MG tablet Take 1 tablet (40 mg total) by mouth daily. 90 tablet 2  . cephALEXin (KEFLEX) 500 MG capsule Take 1 capsule (500 mg total) by mouth 3 (three) times daily. (Patient not taking: Reported on 03/21/2018) 15 capsule 0  . Coenzyme Q10 (CO Q10) 100 MG CAPS Take 100 mg by mouth daily.    Marland Kitchen doxazosin (CARDURA) 1 MG tablet Take 1 mg by mouth at bedtime.    Marland Kitchen doxazosin (CARDURA) 2 MG tablet Take 2 mg by mouth daily. In the morning.    . ezetimibe (ZETIA) 10 MG tablet Take 10 mg by mouth at bedtime.     . finasteride (PROSCAR) 5 MG tablet Take 1 tablet (5 mg total) by mouth at bedtime. 90 tablet 3  . furosemide (LASIX) 40 MG tablet Take 40 mg, alternating with 20 mg daily 90 tablet 3  . Glucosamine HCl-MSM (GLUCOSAMINE-MSM PO) Take 1  tablet by mouth daily.    [name] Ethyl (VASCEPA) 1 g CAPS Take 2 g by mouth 2 (two) times daily.    Marland Kitchen ipratropium-albuterol (DUONEB) 0.5-2.5 (3) MG/3ML SOLN Take 3 mLs by nebulization every 6 (six) hours. (Patient not taking: Reported on 03/21/2018) 360 mL 0  . latanoprost (XALATAN) 0.005 % ophthalmic solution Instill 1 drop into the affected eye at bedtime    . Multiple Vitamins-Minerals (OCUVITE EXTRA PO) Take 1 tablet by mouth daily.    Marland Kitchen oxyCODONE-acetaminophen (PERCOCET/ROXICET) 5-325 MG tablet Take 1 tablet by mouth every 6 (six) hours as needed for moderate pain. (Patient not taking: Reported on 03/21/2018) 8 tablet 0  . ramipril (ALTACE) 2.5 MG capsule Take 2.5 mg by mouth every evening.    . ramipril (ALTACE) 5 MG capsule Take 1 capsule (5 mg total) by mouth daily. 90 capsule 3  . sitaGLIPtin (JANUVIA) 100 MG tablet Take 100 mg by  mouth daily.     Marland Kitchen spironolactone (ALDACTONE) 25 MG tablet Take 1 tablet (25 mg total) by mouth at bedtime. 90 tablet   . tamsulosin (FLOMAX) 0.4 MG CAPS capsule Take 0.4 mg by mouth at bedtime.     . timolol (TIMOPTIC) 0.5 % ophthalmic solution Place 1 drop into the right eye daily.   5  . warfarin (COUMADIN) 3 MG tablet Take as directed by coumadin clinic 80 tablet 3   No current facility-administered medications on file prior to visit.     Allergies  Allergen Reactions  . Grass Extracts [Gramineae Pollens] Other (See Comments)    Sneezing and runny nose  . Milk-Related Compounds     Just Yogurt.  Makes him very lethargic.    Assessment/Plan: Hyperlipidemia: LDL not at goal. Will continue atorvastatin 20mg  and 30mg  alternating and ezetimibe 10mg  daily. Will send for coverage of Repatha 140mg  every 14 days through insurance. Discussed risk/benefit, injection technique, and cost obligation. He states he would like to try for coverage through patient assistance foundation. Will fax this application after coverage obtained through insurance.   Thank you,  Lelan Pons. Patterson Hammersmith, [facility]  07/05/2018 7:05 AM

## 2018-07-06 ENCOUNTER — Telehealth: Payer: Self-pay | Admitting: Pharmacist

## 2018-07-06 MED ORDER — EVOLOCUMAB 140 MG/ML ~~LOC~~ SOAJ: 1.0000 "pen " | SUBCUTANEOUS | 11 refills | Status: DC

## 2018-07-06 NOTE — Telephone Encounter (Signed)
Copay affordable at $30 per month. [facility] will coordinate shipment and pt feels comfortable with injections at home. Advised him to let us know when he starts injections so that we can schedule follow up lab work.

## 2018-07-06 NOTE — Telephone Encounter (Signed)
Prior authorization for Repatha has been approved. Rx sent to specialty pharmacy to see if copay is affordable.

## 2018-07-06 NOTE — Addendum Note (Signed)
Addended by: Marvis Bakken E on: 07/06/2018 08:48 AM   Modules accepted: Orders

## 2018-07-13 DIAGNOSIS — H35373 Puckering of macula, bilateral: Secondary | ICD-10-CM | POA: Diagnosis not present

## 2018-07-13 DIAGNOSIS — H401122 Primary open-angle glaucoma, left eye, moderate stage: Secondary | ICD-10-CM | POA: Diagnosis not present

## 2018-07-13 DIAGNOSIS — Z961 Presence of intraocular lens: Secondary | ICD-10-CM | POA: Diagnosis not present

## 2018-07-14 DIAGNOSIS — I119 Hypertensive heart disease without heart failure: Secondary | ICD-10-CM | POA: Diagnosis not present

## 2018-07-14 DIAGNOSIS — I2510 Atherosclerotic heart disease of native coronary artery without angina pectoris: Secondary | ICD-10-CM | POA: Diagnosis not present

## 2018-07-14 DIAGNOSIS — N139 Obstructive and reflux uropathy, unspecified: Secondary | ICD-10-CM | POA: Diagnosis not present

## 2018-07-14 DIAGNOSIS — M109 Gout, unspecified: Secondary | ICD-10-CM | POA: Diagnosis not present

## 2018-07-14 DIAGNOSIS — E1165 Type 2 diabetes mellitus with hyperglycemia: Secondary | ICD-10-CM | POA: Diagnosis not present

## 2018-07-14 DIAGNOSIS — E1151 Type 2 diabetes mellitus with diabetic peripheral angiopathy without gangrene: Secondary | ICD-10-CM | POA: Diagnosis not present

## 2018-07-14 DIAGNOSIS — E1159 Type 2 diabetes mellitus with other circulatory complications: Secondary | ICD-10-CM | POA: Diagnosis not present

## 2018-07-14 DIAGNOSIS — E782 Mixed hyperlipidemia: Secondary | ICD-10-CM | POA: Diagnosis not present

## 2018-07-15 ENCOUNTER — Ambulatory Visit (INDEPENDENT_AMBULATORY_CARE_PROVIDER_SITE_OTHER): Payer: Medicare Other | Admitting: *Deleted

## 2018-07-15 DIAGNOSIS — I4891 Unspecified atrial fibrillation: Secondary | ICD-10-CM

## 2018-07-15 DIAGNOSIS — Z5181 Encounter for therapeutic drug level monitoring: Secondary | ICD-10-CM

## 2018-07-15 LAB — POCT INR: INR: 1.5 — AB (ref 2.0–3.0)

## 2018-07-15 NOTE — Patient Instructions (Signed)
Description   Take 4 tablets today and 4 tablets tomorrow then start taking 3 tablets daily. Recheck in 11 days.  Call us with any new medications or concerns, Coumadin Clinic # [phone], Main # [phone].

## 2018-07-19 ENCOUNTER — Telehealth: Payer: Self-pay | Admitting: Student-PharmD

## 2018-07-19 NOTE — Telephone Encounter (Signed)
First Repatha injection done on 07/12/2018. Spoke with patient about Repatha injection, he did not have any doses yet as he accidentally put syringes in the freezer. Gave patient 2 sample pens so he can start his injections. Will schedule labs after 4th dose of Repatha.

## 2018-07-21 ENCOUNTER — Other Ambulatory Visit: Payer: Self-pay | Admitting: Cardiology

## 2018-07-26 ENCOUNTER — Ambulatory Visit (INDEPENDENT_AMBULATORY_CARE_PROVIDER_SITE_OTHER): Payer: Medicare Other

## 2018-07-26 DIAGNOSIS — I4891 Unspecified atrial fibrillation: Secondary | ICD-10-CM

## 2018-07-26 DIAGNOSIS — Z5181 Encounter for therapeutic drug level monitoring: Secondary | ICD-10-CM

## 2018-07-26 LAB — POCT INR: INR: 1.9 — AB (ref 2.0–3.0)

## 2018-07-26 NOTE — Patient Instructions (Addendum)
Take 4 tablets today, then continue taking 3 tablets daily. Recheck in 2 weeks..  Call us with any new medications or concerns, Coumadin Clinic # [phone], Main # [phone].

## 2018-07-26 NOTE — Telephone Encounter (Signed)
Agree with plan as documented.

## 2018-07-29 ENCOUNTER — Telehealth (HOSPITAL_COMMUNITY): Payer: Self-pay | Admitting: *Deleted

## 2018-07-29 DIAGNOSIS — I5022 Chronic systolic (congestive) heart failure: Secondary | ICD-10-CM

## 2018-07-29 DIAGNOSIS — I739 Peripheral vascular disease, unspecified: Secondary | ICD-10-CM

## 2018-07-29 DIAGNOSIS — I779 Disorder of arteries and arterioles, unspecified: Secondary | ICD-10-CM

## 2018-07-29 DIAGNOSIS — I480 Paroxysmal atrial fibrillation: Secondary | ICD-10-CM

## 2018-07-29 MED ORDER — FUROSEMIDE 20 MG PO TABS: 20.0000 mg | ORAL_TABLET | Freq: Every day | ORAL | 3 refills | Status: DC

## 2018-07-29 NOTE — Telephone Encounter (Signed)
Received records from [facility] and Dr. Aundra Dubin has reviewed all notes and agrees with medication change for Lasix.  MAR updated for Lasix 20 mg once daily

## 2018-08-09 ENCOUNTER — Ambulatory Visit (INDEPENDENT_AMBULATORY_CARE_PROVIDER_SITE_OTHER): Payer: Medicare Other | Admitting: *Deleted

## 2018-08-09 DIAGNOSIS — Z5181 Encounter for therapeutic drug level monitoring: Secondary | ICD-10-CM | POA: Diagnosis not present

## 2018-08-09 DIAGNOSIS — I4891 Unspecified atrial fibrillation: Secondary | ICD-10-CM | POA: Diagnosis not present

## 2018-08-09 LAB — POCT INR: INR: 1.6 — AB (ref 2.0–3.0)

## 2018-08-09 NOTE — Patient Instructions (Addendum)
Description   Start taking 3 tablets  daily except 4 tablets on Tuesdays and Fridays.  Recheck in 10 days.   Call us with any new medications or concerns, Coumadin Clinic # [phone], Main # [phone].

## 2018-08-22 ENCOUNTER — Ambulatory Visit (INDEPENDENT_AMBULATORY_CARE_PROVIDER_SITE_OTHER): Payer: Medicare Other

## 2018-08-22 ENCOUNTER — Telehealth (HOSPITAL_COMMUNITY): Payer: Self-pay | Admitting: *Deleted

## 2018-08-22 DIAGNOSIS — I4891 Unspecified atrial fibrillation: Secondary | ICD-10-CM

## 2018-08-22 DIAGNOSIS — Z5181 Encounter for therapeutic drug level monitoring: Secondary | ICD-10-CM

## 2018-08-22 LAB — POCT INR: INR: 5.6 — AB (ref 2.0–3.0)

## 2018-08-22 NOTE — Patient Instructions (Signed)
Description   Skip 3 dosages of Coumadin, then start taking 3 tablets daily except 2 tablets on Fridays.  Recheck in 10 days.   Call us with any new medications or concerns, Coumadin Clinic # [phone], Main # [phone].

## 2018-08-22 NOTE — Telephone Encounter (Signed)
Patient previously taking Cardura 2 mg in the Am and 1 mg in the PM.  Patient was advised by someone to stop taking at some point but now has been instructed to restart it at 3 mg Daily by coumadin clinic.    Pharmacy is calling needing a new prescription with specific directions as to how the patient needs to be taking it.  Will forward to Dr. Aundra Dubin to give me new orders for correct dosage and times to take.

## 2018-08-22 NOTE — Telephone Encounter (Signed)
Spoke with patient and he's aware to take Cardura 2mg  in the Am and 1 mg in the PM.  No further questions.

## 2018-08-22 NOTE — Telephone Encounter (Deleted)
Spoke with patient and he's aware of how to take Cardura.  No further questions.

## 2018-08-22 NOTE — Telephone Encounter (Signed)
Would go back to 2 mg qam/1 mg qpm Cardura

## 2018-09-01 ENCOUNTER — Ambulatory Visit (INDEPENDENT_AMBULATORY_CARE_PROVIDER_SITE_OTHER): Payer: Medicare Other | Admitting: Pharmacist

## 2018-09-01 DIAGNOSIS — I4891 Unspecified atrial fibrillation: Secondary | ICD-10-CM | POA: Diagnosis not present

## 2018-09-01 DIAGNOSIS — Z5181 Encounter for therapeutic drug level monitoring: Secondary | ICD-10-CM | POA: Diagnosis not present

## 2018-09-01 LAB — POCT INR: INR: 2.6 (ref 2.0–3.0)

## 2018-09-01 NOTE — Patient Instructions (Signed)
Description   Continue taking 3 tablets daily except 2 tablets on Fridays.  Recheck in 3 weeks.  Pt has recently switched to name brand Coumadin.  Call us with any new medications or concerns, Coumadin Clinic # [phone], Main # [phone].

## 2018-09-07 ENCOUNTER — Other Ambulatory Visit: Payer: Self-pay | Admitting: Cardiology

## 2018-09-07 ENCOUNTER — Other Ambulatory Visit (HOSPITAL_COMMUNITY): Payer: Self-pay

## 2018-09-22 ENCOUNTER — Ambulatory Visit (INDEPENDENT_AMBULATORY_CARE_PROVIDER_SITE_OTHER): Payer: Medicare Other | Admitting: *Deleted

## 2018-09-22 DIAGNOSIS — I4891 Unspecified atrial fibrillation: Secondary | ICD-10-CM

## 2018-09-22 DIAGNOSIS — Z5181 Encounter for therapeutic drug level monitoring: Secondary | ICD-10-CM | POA: Diagnosis not present

## 2018-09-22 LAB — POCT INR: INR: 2 (ref 2.0–3.0)

## 2018-09-22 NOTE — Patient Instructions (Signed)
Description   Continue taking 3 tablets daily except 2 tablets on Fridays.  Recheck in 4 weeks.  Pt has recently switched to name brand Coumadin.  Call us with any new medications or concerns, Coumadin Clinic # [phone], Main # [phone].

## 2018-09-26 ENCOUNTER — Other Ambulatory Visit (HOSPITAL_COMMUNITY): Payer: Medicare Other

## 2018-09-26 ENCOUNTER — Ambulatory Visit: Payer: Medicare Other | Admitting: Surgery

## 2018-09-26 ENCOUNTER — Encounter (HOSPITAL_COMMUNITY): Payer: Medicare Other

## 2018-10-13 DIAGNOSIS — E1165 Type 2 diabetes mellitus with hyperglycemia: Secondary | ICD-10-CM | POA: Diagnosis not present

## 2018-10-14 DIAGNOSIS — E1159 Type 2 diabetes mellitus with other circulatory complications: Secondary | ICD-10-CM | POA: Diagnosis not present

## 2018-10-14 DIAGNOSIS — I10 Essential (primary) hypertension: Secondary | ICD-10-CM | POA: Diagnosis not present

## 2018-10-14 DIAGNOSIS — E782 Mixed hyperlipidemia: Secondary | ICD-10-CM | POA: Diagnosis not present

## 2018-10-14 DIAGNOSIS — M109 Gout, unspecified: Secondary | ICD-10-CM | POA: Diagnosis not present

## 2018-10-14 DIAGNOSIS — E1151 Type 2 diabetes mellitus with diabetic peripheral angiopathy without gangrene: Secondary | ICD-10-CM | POA: Diagnosis not present

## 2018-10-14 DIAGNOSIS — I2510 Atherosclerotic heart disease of native coronary artery without angina pectoris: Secondary | ICD-10-CM | POA: Diagnosis not present

## 2018-10-14 DIAGNOSIS — E1165 Type 2 diabetes mellitus with hyperglycemia: Secondary | ICD-10-CM | POA: Diagnosis not present

## 2018-10-14 DIAGNOSIS — N139 Obstructive and reflux uropathy, unspecified: Secondary | ICD-10-CM | POA: Diagnosis not present

## 2018-10-14 DIAGNOSIS — Z23 Encounter for immunization: Secondary | ICD-10-CM | POA: Diagnosis not present

## 2018-10-19 ENCOUNTER — Ambulatory Visit (INDEPENDENT_AMBULATORY_CARE_PROVIDER_SITE_OTHER): Payer: Medicare Other | Admitting: Pharmacist

## 2018-10-19 DIAGNOSIS — I4891 Unspecified atrial fibrillation: Secondary | ICD-10-CM

## 2018-10-19 DIAGNOSIS — E785 Hyperlipidemia, unspecified: Secondary | ICD-10-CM

## 2018-10-19 DIAGNOSIS — Z5181 Encounter for therapeutic drug level monitoring: Secondary | ICD-10-CM

## 2018-10-19 LAB — POCT INR: INR: 3.3 — AB (ref 2.0–3.0)

## 2018-10-19 NOTE — Progress Notes (Signed)
LDL and hepatic panel ordered to evaluate efficacy of PCSK9i therapy

## 2018-10-19 NOTE — Patient Instructions (Signed)
Description   Skip dose today then continue taking 3 tablets daily except 2 tablets on Fridays.  Recheck in 3 weeks.  Pt has recently switched to name brand Coumadin.  Call us with any new medications or concerns, Coumadin Clinic # [phone], Main # [phone].

## 2018-10-24 ENCOUNTER — Encounter (HOSPITAL_COMMUNITY): Payer: Medicare Other

## 2018-10-24 ENCOUNTER — Telehealth (HOSPITAL_COMMUNITY): Payer: Self-pay

## 2018-10-24 ENCOUNTER — Other Ambulatory Visit (HOSPITAL_COMMUNITY): Payer: Medicare Other

## 2018-10-24 ENCOUNTER — Ambulatory Visit: Payer: Medicare Other | Admitting: Surgery

## 2018-10-24 NOTE — Telephone Encounter (Signed)
Lipid clinic notified. Clinic states they have had the same problem in the past from pt.

## 2018-10-24 NOTE — Telephone Encounter (Signed)
Also attempted to call pt however was unable to reach him or leave message.

## 2018-10-24 NOTE — Telephone Encounter (Signed)
Please let lipid clinic know that he is not taking Repatha and see if they can contact him

## 2018-10-24 NOTE — Telephone Encounter (Signed)
[facility] called to ensure Nathaniel [name] that pt has not been taking his Repatha. Pt will not answer the phone from [facility] about his refills. I attempted to call pt but there was no answer and no VM.

## 2018-10-31 ENCOUNTER — Ambulatory Visit (INDEPENDENT_AMBULATORY_CARE_PROVIDER_SITE_OTHER): Payer: Medicare Other | Admitting: Surgery

## 2018-10-31 ENCOUNTER — Inpatient Hospital Stay (HOSPITAL_COMMUNITY): Admission: RE | Admit: 2018-10-31 | Payer: Medicare Other | Source: Ambulatory Visit

## 2018-10-31 ENCOUNTER — Encounter (HOSPITAL_COMMUNITY): Payer: Medicare Other

## 2018-10-31 ENCOUNTER — Ambulatory Visit (INDEPENDENT_AMBULATORY_CARE_PROVIDER_SITE_OTHER)
Admission: RE | Admit: 2018-10-31 | Discharge: 2018-10-31 | Disposition: A | Discharge status: Home / Self Care | Payer: Medicare Other | Source: Ambulatory Visit | Attending: Surgery | Admitting: Surgery

## 2018-10-31 ENCOUNTER — Encounter: Payer: Self-pay | Admitting: Surgery

## 2018-10-31 ENCOUNTER — Other Ambulatory Visit: Payer: Self-pay

## 2018-10-31 ENCOUNTER — Ambulatory Visit: Payer: Medicare Other | Admitting: Surgery

## 2018-10-31 ENCOUNTER — Ambulatory Visit (HOSPITAL_COMMUNITY)
Admission: RE | Admit: 2018-10-31 | Discharge: 2018-10-31 | Disposition: A | Discharge status: Home / Self Care | Payer: Medicare Other | Source: Ambulatory Visit | Attending: Surgery | Admitting: Surgery

## 2018-10-31 VITALS — BP 136/75 | HR 80 | Resp 18 | Ht 70.5 in | Wt 203.0 lb

## 2018-10-31 DIAGNOSIS — I6521 Occlusion and stenosis of right carotid artery: Secondary | ICD-10-CM | POA: Insufficient documentation

## 2018-10-31 DIAGNOSIS — I714 Abdominal aortic aneurysm, without rupture, unspecified: Secondary | ICD-10-CM

## 2018-10-31 DIAGNOSIS — I6523 Occlusion and stenosis of bilateral carotid arteries: Secondary | ICD-10-CM | POA: Insufficient documentation

## 2018-10-31 NOTE — Progress Notes (Signed)
[facility] [place]  Patient name: Nathaniel [name]. MRN: [id number] DOB: [birth year] Sex: male   REASON FOR VISIT:    Follow up  HISOTRY OF PRESENT ILLNESS:    Nathaniel Denbleyker Booneis a 84 y.o.malewho is status post endovascular repair of an abdominal aortic aneurysm on 04/13/2014. His postoperative course was complicated by atrial fibrillation, requiring anticoagulation. Follow-up CT angiogram reveals the stent graft to be in good position with no evidence of endoleak. I have been following him with ultrasound.  On 02/02/2018 he underwent right carotid endarterectomy with patch angioplasty, including resection with primary anastomosis of the internal carotid artery.  This was done for asymptomatic stenosis.  Intraoperative findings included a very soft plaque with mobile debris.  His postoperative course was uncomplicated.  He has no neurologic symptoms  No interval change.  He remains on anticoagulation.  He is now taking Repatha  PAST MEDICAL HISTORY:   Past Medical History:  Diagnosis Date  . AAA (abdominal aortic aneurysm) ([place])   . Atrial fibrillation ([place])   . Atrial fibrillation ([place])   . CAD (coronary artery disease)   . Cataract   . CHF (congestive heart failure) ([place])   . Chronic kidney disease   . Colon polyps    adenomatous  . Diabetes mellitus   . Diverticulosis   . Dysrhythmia    A-Fib  . GERD (gastroesophageal reflux disease)   . Gout   . Hernia, abdominal   . History of aortic valve replacement with bioprosthetic valve    2007  . Hx: UTI (urinary tract infection)   . Hyperlipemia   . Hypertension   . Internal hemorrhoids   . Peripheral vascular disease ([place])   . Shortness of breath    with exertion     FAMILY HISTORY:   Family History  Problem Relation Age of Onset  . Heart disease Mother   . Heart disease Father        before age 74  . AAA (abdominal aortic aneurysm) Father   . Colon  cancer Neg Hx     SOCIAL HISTORY:   Social History   Tobacco Use  . Smoking status: Former Smoker    Packs/day: 0.25    Years: 20.00    Pack years: 5.00    Types: Cigarettes    Last attempt to quit: 03/23/1992    Years since quitting: 26.6  . Smokeless tobacco: Never Used  Substance Use Topics  . Alcohol use: Yes    Alcohol/week: 10.0 standard drinks    Types: 10 Glasses of wine per week    Comment: 1-2 drinks daily      ALLERGIES:   Allergies  Allergen Reactions  . Grass Extracts [Gramineae Pollens] Other (See Comments)    Sneezing and runny nose  . Milk-Related Compounds     Just Yogurt.  Makes him very lethargic.     CURRENT MEDICATIONS:   Current Outpatient Medications  Medication Sig Dispense Refill  . allopurinol (ZYLOPRIM) 300 MG tablet Take 300 mg by mouth daily.   2  . atorvastatin (LIPITOR) 10 MG tablet TAKE ONE TABLET BY MOUTH ONCE DAILY 30 tablet 10  . atorvastatin (LIPITOR) 20 MG tablet Take 20 mg by mouth every other day 30 tablet 0  . atorvastatin (LIPITOR) 40 MG tablet Take 1 tablet (40 mg total) by mouth daily. (Patient taking differently: Take 20 mg by mouth daily. Alternating 20mg  and 30mg  daily) 90 tablet 2  . Coenzyme Q10 (CO  Q10) 100 MG CAPS Take 100 mg by mouth daily.    Marland Kitchen COUMADIN 3 MG tablet Take as directed by Coumadin clinic 80 tablet 1  . doxazosin (CARDURA) 1 MG tablet Take 1 mg by mouth at bedtime.    Marland Kitchen doxazosin (CARDURA) 2 MG tablet Take 2 mg by mouth daily. In the morning.    . empagliflozin (JARDIANCE) 10 MG TABS tablet Take by mouth.    . Evolocumab (REPATHA SURECLICK) 326 MG/ML SOAJ Inject 1 pen into the skin every 14 (fourteen) days. 2 pen 11  . ezetimibe (ZETIA) 10 MG tablet Take 10 mg by mouth at bedtime.     . finasteride (PROSCAR) 5 MG tablet Take 1 tablet (5 mg total) by mouth at bedtime. 90 tablet 3  . furosemide (LASIX) 20 MG tablet Take 1 tablet (20 mg total) by mouth daily. 90 tablet 3  . Glucosamine HCl-MSM  (GLUCOSAMINE-MSM PO) Take 1 tablet by mouth daily.    [name] Ethyl (VASCEPA) 1 g CAPS Take 2 g by mouth 2 (two) times daily.    Marland Kitchen ipratropium-albuterol (DUONEB) 0.5-2.5 (3) MG/3ML SOLN Take 3 mLs by nebulization every 6 (six) hours. 360 mL 0  . latanoprost (XALATAN) 0.005 % ophthalmic solution Instill 1 drop into the affected eye at bedtime    . Multiple Vitamins-Minerals (OCUVITE EXTRA PO) Take 1 tablet by mouth daily.    . ramipril (ALTACE) 2.5 MG capsule Take 2.5 mg by mouth every evening.    . ramipril (ALTACE) 5 MG capsule Take 1 capsule (5 mg total) by mouth daily. 90 capsule 3  . spironolactone (ALDACTONE) 25 MG tablet Take 1 tablet (25 mg total) by mouth at bedtime. 90 tablet   . tamsulosin (FLOMAX) 0.4 MG CAPS capsule Take 0.4 mg by mouth at bedtime.     . timolol (TIMOPTIC) 0.5 % ophthalmic solution Place 1 drop into the right eye daily.   5  . oxyCODONE-acetaminophen (PERCOCET/ROXICET) 5-325 MG tablet Take 1 tablet by mouth every 6 (six) hours as needed for moderate pain. (Patient not taking: Reported on 03/21/2018) 8 tablet 0   No current facility-administered medications for this visit.     REVIEW OF SYSTEMS:   [X]  denotes positive finding, [ ]  denotes negative finding Cardiac  Comments:  Chest pain or chest pressure:    Shortness of breath upon exertion:    Short of breath when lying flat:    Irregular heart rhythm: x       Vascular    Pain in calf, thigh, or hip brought on by ambulation:    Pain in feet at night that wakes you up from your sleep:     Blood clot in your veins:    Leg swelling:         Pulmonary    Oxygen at home:    Productive cough:     Wheezing:         Neurologic    Sudden weakness in arms or legs:     Sudden numbness in arms or legs:     Sudden onset of difficulty speaking or slurred speech:    Temporary loss of vision in one eye:     Problems with dizziness:         Gastrointestinal    Blood in stool:     Vomited blood:           Genitourinary    Burning when urinating:     Blood in urine:  Psychiatric    Major depression:         Hematologic    Bleeding problems:    Problems with blood clotting too easily:        Skin    Rashes or ulcers:        Constitutional    Fever or chills:      PHYSICAL EXAM:   Vitals:   10/31/18 0909 10/31/18 0911  BP: (!) 144/82 136/75  Pulse: 80   Resp: 18   SpO2: 96%   Weight: 203 lb (92.1 kg)   Height: 5' 10.5" (1.791 m)     GENERAL: The patient is a well-nourished male, in no acute distress. The vital signs are documented above. CARDIAC: There is a regular rate and rhythm.  VASCULAR: No carotid bruits PULMONARY: Non-labored respirations ABDOMEN: Soft and non-tender with normal pitched bowel sounds.  MUSCULOSKELETAL: There are no major deformities or cyanosis. NEUROLOGIC: No focal weakness or paresthesias are detected. SKIN: There are no ulcers or rashes noted. PSYCHIATRIC: The patient has a normal affect.  STUDIES:   I have ordered and reviewed his vascular lab studies with the following findings:  Right carotid shows 40-59% stenosis.  Left side shows 40-59% stenosis AAA: Maximal aortic diameter is 4.1 cm with no evidence of endoleak  MEDICAL ISSUES:   AAA: Aneurysm remains stable, will follow-up ultrasound surveillance in 1 year  Carotid: 40-59% bilateral stenosis.  We will follow-up ultrasound surveillance in 1 year.    Annamarie Major, MD [facility] [phone] Pager [phone]

## 2018-11-03 ENCOUNTER — Telehealth: Payer: Self-pay

## 2018-11-03 DIAGNOSIS — I10 Essential (primary) hypertension: Secondary | ICD-10-CM

## 2018-11-03 NOTE — Telephone Encounter (Signed)
Called pt to schedule appt for labs regarding repatha. Pt was compliant to come in on 11/08/18

## 2018-11-08 ENCOUNTER — Other Ambulatory Visit: Payer: Medicare Other

## 2018-11-08 DIAGNOSIS — H35373 Puckering of macula, bilateral: Secondary | ICD-10-CM | POA: Diagnosis not present

## 2018-11-08 DIAGNOSIS — Z961 Presence of intraocular lens: Secondary | ICD-10-CM | POA: Diagnosis not present

## 2018-11-08 DIAGNOSIS — H401122 Primary open-angle glaucoma, left eye, moderate stage: Secondary | ICD-10-CM | POA: Diagnosis not present

## 2018-11-09 ENCOUNTER — Ambulatory Visit (INDEPENDENT_AMBULATORY_CARE_PROVIDER_SITE_OTHER): Payer: Medicare Other | Admitting: *Deleted

## 2018-11-09 ENCOUNTER — Other Ambulatory Visit: Payer: Medicare Other | Admitting: *Deleted

## 2018-11-09 DIAGNOSIS — I4891 Unspecified atrial fibrillation: Secondary | ICD-10-CM | POA: Diagnosis not present

## 2018-11-09 DIAGNOSIS — Z5181 Encounter for therapeutic drug level monitoring: Secondary | ICD-10-CM | POA: Diagnosis not present

## 2018-11-09 DIAGNOSIS — E785 Hyperlipidemia, unspecified: Secondary | ICD-10-CM | POA: Diagnosis not present

## 2018-11-09 LAB — LIPID PANEL
CHOL/HDL RATIO: 1.7 ratio (ref 0.0–5.0)
Cholesterol, Total: 96 mg/dL — ABNORMAL LOW (ref 100–199)
HDL: 56 mg/dL (ref >39)
LDL CALC: 14 mg/dL (ref 0–99)
Triglycerides: 128 mg/dL (ref 0–149)
VLDL Cholesterol Cal: 26 mg/dL (ref 5–40)

## 2018-11-09 LAB — HEPATIC FUNCTION PANEL
ALT: 16 IU/L (ref 0–44)
AST: 21 IU/L (ref 0–40)
Albumin: 4.2 g/dL (ref 3.5–4.7)
Alkaline Phosphatase: 65 IU/L (ref 39–117)
Bilirubin Total: 0.7 mg/dL (ref 0.0–1.2)
Bilirubin, Direct: 0.22 mg/dL (ref 0.00–0.40)
TOTAL PROTEIN: 6.4 g/dL (ref 6.0–8.5)

## 2018-11-09 LAB — POCT INR: INR: 3.4 — AB (ref 2.0–3.0)

## 2018-11-09 NOTE — Patient Instructions (Addendum)
  Description   Skip today's dose then start taking 3 tablets daily except 2 tablets on Mondays and Fridays.  Recheck in 2 weeks.  Pt has recently switched to name brand Coumadin.  Call us with any new medications or concerns: Coumadin Clinic # [phone], Main # [phone].

## 2018-11-23 ENCOUNTER — Telehealth (HOSPITAL_COMMUNITY): Payer: Self-pay | Admitting: Cardiology

## 2018-11-23 NOTE — Telephone Encounter (Signed)
-----   Message from Erskine Emery, [facility] sent at 11/22/2018 10:23 PM EST ----- Regarding: RE: Repatha Pt will need to contact [facility] [phone]- if he would prefer he may be able to fill at his local pharmacy since these medications are no longer strictly specialty medications. He will just need a prescription sent to the pharmacy.   Thanks - Georgina Peer   ----- Message ----- From: Kerry Dory, CMA Sent: 11/21/2018   4:47 PM EST To: Erskine Emery, RPH, Leeroy Bock, RPH Subject: Repatha                                        Hello! This patient came by this afternoon with concerns about his Repatha. It seems like he is  In need of a reptatha shipment and does not have the number to re order, sorry couldn't find the number to give directly to him   Please call the patient directly as his number has changed [phone] (H)   Thanks

## 2018-11-23 NOTE — Telephone Encounter (Signed)
Detailed message left for patient with instructions regarding repatha refills. Advised to return call for questions or further details/instructions are needed

## 2018-11-29 ENCOUNTER — Ambulatory Visit (INDEPENDENT_AMBULATORY_CARE_PROVIDER_SITE_OTHER): Payer: Medicare Other

## 2018-11-29 DIAGNOSIS — Z5181 Encounter for therapeutic drug level monitoring: Secondary | ICD-10-CM | POA: Diagnosis not present

## 2018-11-29 DIAGNOSIS — I4891 Unspecified atrial fibrillation: Secondary | ICD-10-CM | POA: Diagnosis not present

## 2018-11-29 LAB — POCT INR: INR: 2.8 (ref 2.0–3.0)

## 2018-11-29 NOTE — Patient Instructions (Signed)
Description   Continue on same dosage 3 tablets daily except 2 tablets on Mondays and Fridays.  Recheck in 3 weeks.  Pt has recently switched to name brand Coumadin. Call us with any new medications or concerns: Coumadin Clinic # [phone], Main # [phone].

## 2018-12-20 ENCOUNTER — Ambulatory Visit (INDEPENDENT_AMBULATORY_CARE_PROVIDER_SITE_OTHER): Payer: Medicare Other

## 2018-12-20 DIAGNOSIS — I4891 Unspecified atrial fibrillation: Secondary | ICD-10-CM | POA: Diagnosis not present

## 2018-12-20 DIAGNOSIS — Z5181 Encounter for therapeutic drug level monitoring: Secondary | ICD-10-CM | POA: Diagnosis not present

## 2018-12-20 LAB — POCT INR: INR: 1.6 — AB (ref 2.0–3.0)

## 2018-12-20 NOTE — Patient Instructions (Signed)
Please take 4 tablets tonight, then resume same dosage 3 tablets daily except 2 tablets on Mondays and Fridays.  Recheck in 3 weeks.  Pt has recently switched to name brand Coumadin. Call us with any new medications or concerns: Coumadin Clinic # [phone], Main # [phone].

## 2019-01-10 ENCOUNTER — Ambulatory Visit (INDEPENDENT_AMBULATORY_CARE_PROVIDER_SITE_OTHER): Payer: Medicare Other | Admitting: Pharmacist

## 2019-01-10 ENCOUNTER — Encounter (INDEPENDENT_AMBULATORY_CARE_PROVIDER_SITE_OTHER): Payer: Self-pay

## 2019-01-10 DIAGNOSIS — Z5181 Encounter for therapeutic drug level monitoring: Secondary | ICD-10-CM | POA: Diagnosis not present

## 2019-01-10 DIAGNOSIS — I4891 Unspecified atrial fibrillation: Secondary | ICD-10-CM

## 2019-01-10 LAB — POCT INR: INR: 2.8 (ref 2.0–3.0)

## 2019-01-10 NOTE — Patient Instructions (Signed)
Description   Continue taking 3 tablets daily except 2 tablets on Mondays and Fridays.  Recheck in 4 weeks.  Pt has recently switched to name brand Coumadin. Call us with any new medications or concerns: Coumadin Clinic # [phone], Main # [phone].

## 2019-01-12 ENCOUNTER — Other Ambulatory Visit (HOSPITAL_COMMUNITY): Payer: Self-pay | Admitting: Cardiology

## 2019-01-12 NOTE — Telephone Encounter (Signed)
Pt needs follow up appt [phone]

## 2019-01-13 DIAGNOSIS — E782 Mixed hyperlipidemia: Secondary | ICD-10-CM | POA: Diagnosis not present

## 2019-01-13 DIAGNOSIS — E1165 Type 2 diabetes mellitus with hyperglycemia: Secondary | ICD-10-CM | POA: Diagnosis not present

## 2019-01-17 DIAGNOSIS — I2510 Atherosclerotic heart disease of native coronary artery without angina pectoris: Secondary | ICD-10-CM | POA: Diagnosis not present

## 2019-01-17 DIAGNOSIS — E1159 Type 2 diabetes mellitus with other circulatory complications: Secondary | ICD-10-CM | POA: Diagnosis not present

## 2019-01-17 DIAGNOSIS — Z794 Long term (current) use of insulin: Secondary | ICD-10-CM | POA: Diagnosis not present

## 2019-01-17 DIAGNOSIS — E782 Mixed hyperlipidemia: Secondary | ICD-10-CM | POA: Diagnosis not present

## 2019-01-17 DIAGNOSIS — N139 Obstructive and reflux uropathy, unspecified: Secondary | ICD-10-CM | POA: Diagnosis not present

## 2019-01-17 DIAGNOSIS — E1165 Type 2 diabetes mellitus with hyperglycemia: Secondary | ICD-10-CM | POA: Diagnosis not present

## 2019-01-17 DIAGNOSIS — M109 Gout, unspecified: Secondary | ICD-10-CM | POA: Diagnosis not present

## 2019-01-17 DIAGNOSIS — E1151 Type 2 diabetes mellitus with diabetic peripheral angiopathy without gangrene: Secondary | ICD-10-CM | POA: Diagnosis not present

## 2019-01-17 DIAGNOSIS — I10 Essential (primary) hypertension: Secondary | ICD-10-CM | POA: Diagnosis not present

## 2019-01-30 ENCOUNTER — Telehealth: Payer: Self-pay | Admitting: Adult Health

## 2019-01-30 NOTE — Telephone Encounter (Signed)
LMOMTCB x 1 

## 2019-01-30 NOTE — Telephone Encounter (Signed)
Was suppose to have CT chest to follow lung nodule ,  And a follow up ov to discuss ? Done

## 2019-02-03 NOTE — Telephone Encounter (Signed)
lmtcb x2 for pt. 

## 2019-02-06 NOTE — Telephone Encounter (Signed)
LMOM TCB x3 Letter sent to patient's home address asking him to please contact the office Will sign off per office protocol and await patient's return call

## 2019-02-07 ENCOUNTER — Ambulatory Visit (INDEPENDENT_AMBULATORY_CARE_PROVIDER_SITE_OTHER): Payer: Medicare Other | Admitting: Pharmacist

## 2019-02-07 DIAGNOSIS — Z5181 Encounter for therapeutic drug level monitoring: Secondary | ICD-10-CM

## 2019-02-07 DIAGNOSIS — I4891 Unspecified atrial fibrillation: Secondary | ICD-10-CM | POA: Diagnosis not present

## 2019-02-07 LAB — POCT INR: INR: 2.7 (ref 2.0–3.0)

## 2019-02-07 NOTE — Patient Instructions (Signed)
Continue taking 3 tablets daily except 2 tablets on Mondays and Fridays.  Recheck in 4 weeks.   Call us with any new medications or concerns: Coumadin Clinic # [phone], Main # [phone].

## 2019-02-09 ENCOUNTER — Other Ambulatory Visit (HOSPITAL_COMMUNITY): Payer: Self-pay | Admitting: Cardiology

## 2019-03-07 ENCOUNTER — Telehealth: Payer: Self-pay

## 2019-03-07 NOTE — Telephone Encounter (Signed)

## 2019-03-08 DIAGNOSIS — R339 Retention of urine, unspecified: Secondary | ICD-10-CM | POA: Diagnosis not present

## 2019-03-09 ENCOUNTER — Other Ambulatory Visit: Payer: Self-pay

## 2019-03-09 ENCOUNTER — Ambulatory Visit (INDEPENDENT_AMBULATORY_CARE_PROVIDER_SITE_OTHER): Payer: Medicare Other | Admitting: Pharmacist

## 2019-03-09 DIAGNOSIS — Z5181 Encounter for therapeutic drug level monitoring: Secondary | ICD-10-CM

## 2019-03-09 DIAGNOSIS — I4891 Unspecified atrial fibrillation: Secondary | ICD-10-CM | POA: Diagnosis not present

## 2019-03-09 LAB — POCT INR: INR: 2 (ref 2.0–3.0)

## 2019-03-09 NOTE — Patient Instructions (Signed)
Description   Continue taking 3 tablets daily except 2 tablets on Mondays and Fridays.  Recheck in 8 weeks.  Pt has recently switched to name brand Coumadin. Call us with any new medications or concerns: Coumadin Clinic # [phone], Main # [phone].

## 2019-03-13 DIAGNOSIS — N138 Other obstructive and reflux uropathy: Secondary | ICD-10-CM | POA: Diagnosis not present

## 2019-03-13 DIAGNOSIS — N401 Enlarged prostate with lower urinary tract symptoms: Secondary | ICD-10-CM | POA: Diagnosis not present

## 2019-04-12 DIAGNOSIS — E782 Mixed hyperlipidemia: Secondary | ICD-10-CM | POA: Diagnosis not present

## 2019-04-12 DIAGNOSIS — E1165 Type 2 diabetes mellitus with hyperglycemia: Secondary | ICD-10-CM | POA: Diagnosis not present

## 2019-04-13 ENCOUNTER — Telehealth: Payer: Self-pay | Admitting: Cardiology

## 2019-04-13 NOTE — Telephone Encounter (Signed)
Returned call to pt and LMOM to see if he is still receiving Repatha shipments or if he stopped due to adverse effects. Will also clarify if he is still talking atorvastatin and what strength since he has 3 different strengths on his med list currently.

## 2019-04-13 NOTE — Telephone Encounter (Signed)
Tera, representative at [facility] calling stating that they have not been able to contact pt concerning his repatha. The pt has not requested a refill since March and they were calling to inform us, so that the doctor could and check up on pt concerning his medication. Pharmacy number is [phone]. Please address

## 2019-04-14 ENCOUNTER — Other Ambulatory Visit: Payer: Self-pay | Admitting: Cardiology

## 2019-04-17 DIAGNOSIS — I130 Hypertensive heart and chronic kidney disease with heart failure and stage 1 through stage 4 chronic kidney disease, or unspecified chronic kidney disease: Secondary | ICD-10-CM | POA: Diagnosis not present

## 2019-04-17 DIAGNOSIS — E1122 Type 2 diabetes mellitus with diabetic chronic kidney disease: Secondary | ICD-10-CM | POA: Diagnosis not present

## 2019-04-17 DIAGNOSIS — I739 Peripheral vascular disease, unspecified: Secondary | ICD-10-CM | POA: Diagnosis not present

## 2019-04-17 DIAGNOSIS — N139 Obstructive and reflux uropathy, unspecified: Secondary | ICD-10-CM | POA: Diagnosis not present

## 2019-04-17 DIAGNOSIS — E1159 Type 2 diabetes mellitus with other circulatory complications: Secondary | ICD-10-CM | POA: Diagnosis not present

## 2019-04-17 DIAGNOSIS — N182 Chronic kidney disease, stage 2 (mild): Secondary | ICD-10-CM | POA: Diagnosis not present

## 2019-04-17 DIAGNOSIS — E782 Mixed hyperlipidemia: Secondary | ICD-10-CM | POA: Diagnosis not present

## 2019-04-17 DIAGNOSIS — I2510 Atherosclerotic heart disease of native coronary artery without angina pectoris: Secondary | ICD-10-CM | POA: Diagnosis not present

## 2019-04-17 DIAGNOSIS — E1151 Type 2 diabetes mellitus with diabetic peripheral angiopathy without gangrene: Secondary | ICD-10-CM | POA: Diagnosis not present

## 2019-04-17 DIAGNOSIS — I5042 Chronic combined systolic (congestive) and diastolic (congestive) heart failure: Secondary | ICD-10-CM | POA: Diagnosis not present

## 2019-04-17 DIAGNOSIS — M109 Gout, unspecified: Secondary | ICD-10-CM | POA: Diagnosis not present

## 2019-04-18 NOTE — Telephone Encounter (Signed)
LMOM again for pt.

## 2019-05-02 ENCOUNTER — Telehealth: Payer: Self-pay

## 2019-05-02 NOTE — Telephone Encounter (Signed)
lmom for reschedule and prescreen

## 2019-05-03 NOTE — Telephone Encounter (Signed)
1. Do you currently have a fever? No 2. Have you recently travelled on a cruise, internationally, or to [place], [place], [place], [place], [place], or [place], [place] [employer]) ? No 3. Have you been in contact with someone that is currently pending confirmation of Covid19 testing or has been confirmed to have the [place] virus?  No 4. Are you currently experiencing fatigue or cough? No  Pt. Advised that we are restricting visitors at this time and anyone present in the vehicle should meet the above criteria as well. Advised that visit will be at curbside for finger stick ONLY and will receive call with instructions. Pt also advised to please bring own pen for signature of arrival document.

## 2019-05-03 NOTE — Telephone Encounter (Signed)
lmom for prescreen  

## 2019-05-04 ENCOUNTER — Ambulatory Visit (INDEPENDENT_AMBULATORY_CARE_PROVIDER_SITE_OTHER): Payer: Medicare Other | Admitting: Pharmacist

## 2019-05-04 ENCOUNTER — Other Ambulatory Visit: Payer: Self-pay

## 2019-05-04 DIAGNOSIS — Z5181 Encounter for therapeutic drug level monitoring: Secondary | ICD-10-CM | POA: Diagnosis not present

## 2019-05-04 DIAGNOSIS — I4891 Unspecified atrial fibrillation: Secondary | ICD-10-CM | POA: Diagnosis not present

## 2019-05-04 LAB — PROTIME-INR
INR: 8.9 (ref 0.8–1.2)
Prothrombin Time: 83.4 s — ABNORMAL HIGH (ref 9.1–12.0)

## 2019-05-05 ENCOUNTER — Telehealth: Payer: Self-pay

## 2019-05-05 NOTE — Telephone Encounter (Signed)
lmom for prescreen  

## 2019-05-09 ENCOUNTER — Other Ambulatory Visit: Payer: Self-pay

## 2019-05-09 ENCOUNTER — Ambulatory Visit (INDEPENDENT_AMBULATORY_CARE_PROVIDER_SITE_OTHER): Payer: Medicare Other | Admitting: *Deleted

## 2019-05-09 ENCOUNTER — Telehealth: Payer: Self-pay | Admitting: Cardiology

## 2019-05-09 DIAGNOSIS — I4891 Unspecified atrial fibrillation: Secondary | ICD-10-CM | POA: Diagnosis not present

## 2019-05-09 DIAGNOSIS — Z5181 Encounter for therapeutic drug level monitoring: Secondary | ICD-10-CM | POA: Diagnosis not present

## 2019-05-09 LAB — POCT INR: INR: 1.4 — AB (ref 2.0–3.0)

## 2019-05-09 NOTE — Telephone Encounter (Signed)
Returned call to pt, LMOM.

## 2019-05-09 NOTE — Telephone Encounter (Signed)
See anticoag encounter from 05/09/2019

## 2019-05-09 NOTE — Telephone Encounter (Signed)
Pt received a call about his coumadin check. Please call back

## 2019-05-12 ENCOUNTER — Telehealth: Payer: Self-pay | Admitting: Pharmacist

## 2019-05-12 NOTE — Telephone Encounter (Signed)
Patient was called to make aware of upcoming appointment time and that it will be held in clinic. LVM with call back number.

## 2019-05-16 DIAGNOSIS — R8290 Unspecified abnormal findings in urine: Secondary | ICD-10-CM | POA: Diagnosis not present

## 2019-05-16 DIAGNOSIS — R319 Hematuria, unspecified: Secondary | ICD-10-CM | POA: Diagnosis not present

## 2019-05-23 ENCOUNTER — Ambulatory Visit (HOSPITAL_COMMUNITY)
Admission: RE | Admit: 2019-05-23 | Discharge: 2019-05-23 | Disposition: A | Discharge status: Home / Self Care | Payer: Medicare Other | Source: Ambulatory Visit | Attending: Cardiology | Admitting: Cardiology

## 2019-05-23 ENCOUNTER — Other Ambulatory Visit (HOSPITAL_COMMUNITY): Payer: Self-pay

## 2019-05-23 ENCOUNTER — Encounter (HOSPITAL_COMMUNITY): Payer: Self-pay

## 2019-05-23 ENCOUNTER — Telehealth (HOSPITAL_COMMUNITY): Payer: Self-pay | Admitting: *Deleted

## 2019-05-23 ENCOUNTER — Other Ambulatory Visit: Payer: Self-pay

## 2019-05-23 ENCOUNTER — Encounter (HOSPITAL_COMMUNITY): Payer: Self-pay | Admitting: Cardiology

## 2019-05-23 VITALS — BP 124/69 | HR 104 | Wt 200.4 lb

## 2019-05-23 DIAGNOSIS — I714 Abdominal aortic aneurysm, without rupture: Secondary | ICD-10-CM | POA: Diagnosis not present

## 2019-05-23 DIAGNOSIS — I739 Peripheral vascular disease, unspecified: Secondary | ICD-10-CM | POA: Diagnosis not present

## 2019-05-23 DIAGNOSIS — I110 Hypertensive heart disease with heart failure: Secondary | ICD-10-CM | POA: Diagnosis not present

## 2019-05-23 DIAGNOSIS — Z951 Presence of aortocoronary bypass graft: Secondary | ICD-10-CM | POA: Diagnosis not present

## 2019-05-23 DIAGNOSIS — I480 Paroxysmal atrial fibrillation: Secondary | ICD-10-CM

## 2019-05-23 DIAGNOSIS — K219 Gastro-esophageal reflux disease without esophagitis: Secondary | ICD-10-CM | POA: Diagnosis not present

## 2019-05-23 DIAGNOSIS — Z953 Presence of xenogenic heart valve: Secondary | ICD-10-CM | POA: Insufficient documentation

## 2019-05-23 DIAGNOSIS — Z7984 Long term (current) use of oral hypoglycemic drugs: Secondary | ICD-10-CM | POA: Diagnosis not present

## 2019-05-23 DIAGNOSIS — E1151 Type 2 diabetes mellitus with diabetic peripheral angiopathy without gangrene: Secondary | ICD-10-CM | POA: Diagnosis not present

## 2019-05-23 DIAGNOSIS — I2510 Atherosclerotic heart disease of native coronary artery without angina pectoris: Secondary | ICD-10-CM | POA: Insufficient documentation

## 2019-05-23 DIAGNOSIS — I779 Disorder of arteries and arterioles, unspecified: Secondary | ICD-10-CM

## 2019-05-23 DIAGNOSIS — M109 Gout, unspecified: Secondary | ICD-10-CM | POA: Insufficient documentation

## 2019-05-23 DIAGNOSIS — Z7901 Long term (current) use of anticoagulants: Secondary | ICD-10-CM | POA: Insufficient documentation

## 2019-05-23 DIAGNOSIS — I5042 Chronic combined systolic (congestive) and diastolic (congestive) heart failure: Secondary | ICD-10-CM | POA: Diagnosis not present

## 2019-05-23 DIAGNOSIS — Z79899 Other long term (current) drug therapy: Secondary | ICD-10-CM | POA: Insufficient documentation

## 2019-05-23 DIAGNOSIS — E785 Hyperlipidemia, unspecified: Secondary | ICD-10-CM | POA: Insufficient documentation

## 2019-05-23 DIAGNOSIS — I5022 Chronic systolic (congestive) heart failure: Secondary | ICD-10-CM | POA: Diagnosis not present

## 2019-05-23 DIAGNOSIS — N400 Enlarged prostate without lower urinary tract symptoms: Secondary | ICD-10-CM | POA: Diagnosis not present

## 2019-05-23 LAB — CBC
HCT: 45.9 % (ref 39.0–52.0)
Hemoglobin: 14.8 g/dL (ref 13.0–17.0)
MCH: 28.9 pg (ref 26.0–34.0)
MCHC: 32.2 g/dL (ref 30.0–36.0)
MCV: 89.6 fL (ref 80.0–100.0)
Platelets: 185 10*3/uL (ref 150–400)
RBC: 5.12 MIL/uL (ref 4.22–5.81)
RDW: 13.1 % (ref 11.5–15.5)
WBC: 6.8 10*3/uL (ref 4.0–10.5)
nRBC: 0 % (ref 0.0–0.2)

## 2019-05-23 LAB — BASIC METABOLIC PANEL
Anion gap: 11 (ref 5–15)
BUN: 18 mg/dL (ref 8–23)
CO2: 23 mmol/L (ref 22–32)
Calcium: 9.1 mg/dL (ref 8.9–10.3)
Chloride: 102 mmol/L (ref 98–111)
Creatinine, Ser: 1 mg/dL (ref 0.61–1.24)
GFR calc Af Amer: >60 mL/min (ref >60)
GFR calc non Af Amer: >60 mL/min (ref >60)
Glucose, Bld: 129 mg/dL — ABNORMAL HIGH (ref 70–99)
Potassium: 4.1 mmol/L (ref 3.5–5.1)
Sodium: 136 mmol/L (ref 135–145)

## 2019-05-23 LAB — PROTIME-INR
INR: 3.5 — ABNORMAL HIGH (ref 0.8–1.2)
Prothrombin Time: 34.3 seconds — ABNORMAL HIGH (ref 11.4–15.2)

## 2019-05-23 LAB — BRAIN NATRIURETIC PEPTIDE: B Natriuretic Peptide: 285.2 pg/mL — ABNORMAL HIGH (ref 0.0–100.0)

## 2019-05-23 MED ORDER — METOPROLOL SUCCINATE ER 25 MG PO TB24: 25.0000 mg | ORAL_TABLET | Freq: Every day | ORAL | 3 refills | Status: DC

## 2019-05-23 MED ORDER — FUROSEMIDE 20 MG PO TABS: 40.0000 mg | ORAL_TABLET | Freq: Every day | ORAL | 3 refills | Status: DC

## 2019-05-23 NOTE — Patient Instructions (Addendum)
INCREASE Lasix to 40mg  (2 tabs) daily  START Toprol 25mg  (1 tab) daily  You scheduled for Covid testing on June 10th at 9:40a at [facility]. There will be a drive in tent.  Once you have this test done, you have to return home and stay there until your procedure 05/25/2019.  PLEASE GO TO COUMADIN CLINIC on May 25, 2019 at 9am to check your INR level.  YOU HAVE BEEN SCHEDULED FOR TEE/DCCV ON May 25, 2019 AT 11:15A Your physician has requested that you have a TEE/Cardioversion. During a TEE, sound waves are used to create images of your heart. It provides your doctor with information about the size and shape of your heart and how well your heart's chambers and valves are working. In this test, a transducer is attached to the end of a flexible tube that is guided down you throat and into your esophagus (the tube leading from your mouth to your stomach) to get a more detailed image of your heart. Once the TEE has determined that a blood clot is not present, the cardioversion begins. Electrical Cardioversion uses a jolt of electricity to your heart either through paddles or wired patches attached to your chest. This is a controlled, usually prescheduled, procedure. This procedure is done at the hospital and you are not awake during the procedure. You usually go home the day of the procedure. Please see the instruction sheet given to you today for more information.  Your physician recommends that you schedule a follow-up appointment in: 2 weeks with Dr. Aundra Dubin

## 2019-05-23 NOTE — Progress Notes (Signed)
Instructions reviewed with patient from todays visit. Questions answered.  Verbalized understanding.

## 2019-05-23 NOTE — Telephone Encounter (Signed)
Pt called concerned about SOB, he states it has been going on for a week or so but has gotten really bad since Thursday, he states it occurs with any movement and sometimes just with talking.  He is concerned about being in a-fib again.  Scheduled appt for this afternoon w/Dr Aundra Dubin.    COVID-19 pre-appointment screening questions:   Do you have a history of COVID-19 or a positive test result in the past 7-10 days? NO  To the best of your knowledge, have you been in close contact with anyone with a confirmed diagnosis of COVID 19? NO  Have you had any one or more of the following: Fever, chills, cough, shortness of breath (out of the normal for you) or any flu-like symptoms? NO  Are you experiencing any of the following symptoms that is new or out of usual for you:  . Ear, nose or throat discomfort . Sore throat . Headache . Muscle Pain . Diarrhea . Loss of taste or smell   Reviewed all the following with patient: . Use of hand sanitizer when entering the building . Everyone is required to wear a mask in the building, if you do not have a mask we are happy to provide you with one when you arrive . NO Visitor guidelines   If patient answers YES to any of questions they must change to a virtual visit and place note in comments about symptoms

## 2019-05-24 ENCOUNTER — Telehealth (HOSPITAL_COMMUNITY): Payer: Self-pay

## 2019-05-24 ENCOUNTER — Other Ambulatory Visit (HOSPITAL_COMMUNITY)
Admission: RE | Admit: 2019-05-24 | Discharge: 2019-05-24 | Disposition: A | Discharge status: Home / Self Care | Payer: Medicare Other | Source: Ambulatory Visit | Attending: Cardiology | Admitting: Cardiology

## 2019-05-24 ENCOUNTER — Telehealth: Payer: Self-pay | Admitting: Pharmacist

## 2019-05-24 DIAGNOSIS — Z1159 Encounter for screening for other viral diseases: Secondary | ICD-10-CM | POA: Diagnosis not present

## 2019-05-24 DIAGNOSIS — Z01812 Encounter for preprocedural laboratory examination: Secondary | ICD-10-CM | POA: Insufficient documentation

## 2019-05-24 NOTE — H&P (View-Only) (Signed)
Patient ID: Nathaniel [name]., male   DOB: 1932-12-11, 85 y.o.   MRN: [id number] PCP: Dr. Elyse Hsu Cardiology: Dr. Aundra Dubin  85 y.o. with history of extensive vascular disease including CAD s/p CABG and redo CABG, AAA s/p repair, PAD, and carotid stenosis as well as prior AVR presents for followup of CAD and atrial fibrillation.   He has had documentation of significant peripheral arterial disease.  He gets bilateral calf soreness after walking for about 15 minutes.  This resolves with rest, no rest pain.  No pedal ulcers, though when he gets cuts on his feet, they heal slowly.  Peripheral arterial dopplers done in 10/14 showed > 50% focal SFA stenoses bilaterally.  He has carotid artery disease.  No stroke-like symptoms.  He had a endovascular AAA repair in 5/15.    After his AAA repair, he was noted to be in atrial fibrillation.  He had had a prior episode of atrial fibrillation after his cardiac surgery in 2007.  Atrial fibrillation was rate-controlled.  He was started on warfarin and Toprol XL.  He developed exertional dyspnea and fatigue in atrial fibrillation. I cardioverted him back to NSR on 06/06/14.  I also had him get an echocardiogram in 6/15.  This showed EF worsened to 25-30% with stable bioprosthetic aortic valve, mildly dilated RV with normal systolic function.  Given the fall in EF, I took him for [facility] in 7/15.  This showed stable anatomy.  The LIMA-LAD was patent, the sequential SVG-OM/ramus/D was patent only to ramus but this is the same as the prior cath, and the SVG-PDA was patent with a long 50-60% mid-graft stenosis.  No intervention.  Of note, at cath he was noted to be back in atrial fibrillation.  I then started him on amiodarone.  I took Mr Frate for [place] again on 08/21/14.  This was successful.  He has been off amiodarone due to side effects (hallucinations).  Echo in 1/16 showed recovery of EF to 55-60%.    He was admitted in 1/19 with bronchitis/wheezing/dyspnea and was found to be in  atrial fibrillation with RVR.  He tested positive for metapneumovirus.  Echo showed EF 45-50% with normal bioprosthetic aortic valve. He was diuresed for volume overload while in the hospital. He had DCCV to NSR.    Carotid dopplers in 1/19 showed 80-99% RICA stenosis, he had right CEA in 2/87 with no complications.   He has felt like his energy level has been worse for several months.  He has been getting short of breath after walking 12-15 minutes.  However, for the last 5 days, his exertional dyspnea has significantly worsened.  He has noted palpitations/faster HR.  He will get rare chest tightness (across chest) with no particular trigger.  No exertional chest pain.  No lightheadedness or syncope. Last INR was low at 1.4.   ECG: atrial fibrillation at 115, inferolateral TWIs  Labs (5/15): K 4.3, creatinine 0.89 Labs (6/15): K 4.3, creatinine 1.4, BNP 112 Labs (7/15): K 4.4, creatinine 1.2, HCT 40.2 Labs (8/15): K 4, creatinine 1.2, BNP 113, LFTs and TSH normal Labs (10/15): K 4.4, creatinine 1.4 Labs (5/16): K 4.7, creatinine 1.18, HDL 52, LDL 98 Labs (6/16): LDL 118, LFTs normal Labs (1/17): HCT 40.2, K 3.7, creatinine 1.04 Labs (1/19): K 4.9, creatinine 1.19 Labs (2/19): K 4.2, creatinine 0.89 Labs (3/19): LDL 86, HDL 45, TGs 245 Labs (5/19): LDL 110, HDL 39 Labs (6/19): K 4.1, creatinine 1.07 Labs (11/19): LDL 14, HDL 56  PMH: 1. PAD: Peripheral arterial dopplers in 2012 showed > 75% bilateral SFA stenosis. Peripheral arterial dopplers in 10/14 showed > 50% focal bilateral SFA stenoses.  2. AAA: [place] 4/15 with 4.4 cm AAA but concern for penetrating ulcers. CTA abdomen showed 4.4 cm AAA with penetrating ulcers and possible pseudoaneurysms. Now s/p endovascular repair of AAA in 0/96 with no complications.  3. Carotid stenosis: Carotid dopplers (4/15) with 60-79% bilateral ICA stenosis. Carotid dopplers (11/15) with 60-79% bilateral ICA stenosis.  Carotid dopplers 6/16 with 60-79%  bilateral ICA stenosis. - Carotid dopplers (1/18): 60-79% RICA stenosis.  - Carotid dopplers (1/19): 80-99% RICA stenosis => Right CEA in 2/19.  - Carotid dopplers (11/19): 40-59% bilateral stenosis.  4. CAD: CABG 1989 with LIMA-LAD, SVG-D, seq SVG-ramus and OM, sequential SVG-PDA/PLV. SVG-PDA/PLV found to be occluded on cath prior to AVR, so patient had SVG-PDA with AVR in 2007. LHC (7/15) with LIMA-LAD patent with 40-50% stenosis in LAD after touchdown, sequential SVG-ramus/OM/diagonal with only the ramus branch still intact (known from prior cath), SVG-PDA from original surgery TO, SVG-PDA from redo surgery with long 50-60% mid-graft stenosis.  No target for intervention.  5. Severe aortic stenosis: Bioprosthetic AVR in 2007. Echo (11/10) with EF 60-65%, basal to mid inferior hypokinesis, bioprosthetic aortic valve without significant stenosis/regurgitation, mild MR. Echo (11/14) with EF 55-60%, mild LVH, bioprosthetic aortic valve looked ok. Bioprosthetic valve looked ok on 6/15 echo.   6. Atrial fibrillation: Paroxysmal. Initially noted after cardiac surgery, then again after AAA repair.  DCCV to NSR 06/06/14.  DCCV to NSR again 08/21/14.  - DCCV to NSR in 1/19.  7. Type II diabetes  8. Gout  9. GERD  10. Hyperlipidemia  11. HTN 12. GERD  13. Chronic systolic CHF: Echo (0/45) with EF worsened to 25-30% with grade II diastolic dysfunction, stable bioprosthetic aortic valve, mildly dilated RV with normal systolic function.  Echo (1/16) with EF 55-60%, basal inferior hypokinesis, mild LVH, bioprosthetic aortic valve with mean gradient 13 mmHg, mild MR, severe LAE.  - Echo (1/19): EF 45-50%, mild MR, normal bioprosthetic aortic valve.  14. BPH  15. Lung nodules: Followed by pulmonary.   SH: Widower, 2 children, raised his 3 grand-daughters, lives in [place], [place].  Occasional ETOH, no smoking.   FH: Father died from ruptured AAA.   ROS: All systems reviewed and negative except as per  HPI.   Current Outpatient Medications  Medication Sig Dispense Refill  . allopurinol (ZYLOPRIM) 300 MG tablet Take 300 mg by mouth daily.   2  . atorvastatin (LIPITOR) 10 MG tablet TAKE ONE TABLET BY MOUTH ONCE DAILY 30 tablet 10  . atorvastatin (LIPITOR) 20 MG tablet TAKE ONE TABLET EVERY OTHER DAY 30 tablet 0  . Coenzyme Q10 (CO Q10) 100 MG CAPS Take 100 mg by mouth daily.    Marland Kitchen doxazosin (CARDURA) 1 MG tablet Take 1 mg by mouth at bedtime.    Marland Kitchen doxazosin (CARDURA) 2 MG tablet Take 2 mg by mouth daily. In the morning.    . empagliflozin (JARDIANCE) 10 MG TABS tablet Take by mouth.    . Evolocumab (REPATHA SURECLICK) 409 MG/ML SOAJ Inject 1 pen into the skin every 14 (fourteen) days. 2 pen 11  . ezetimibe (ZETIA) 10 MG tablet Take 10 mg by mouth at bedtime.     . finasteride (PROSCAR) 5 MG tablet Take 1 tablet (5 mg total) by mouth at bedtime. 90 tablet 3  . furosemide (LASIX) 20 MG tablet Take 2 tablets (  40 mg total) by mouth daily. 60 tablet 3  . Glucosamine HCl-MSM (GLUCOSAMINE-MSM PO) Take 1 tablet by mouth daily.    Marland Kitchen latanoprost (XALATAN) 0.005 % ophthalmic solution Instill 1 drop into the affected eye at bedtime    . Multiple Vitamins-Minerals (OCUVITE EXTRA PO) Take 1 tablet by mouth daily.    . ramipril (ALTACE) 2.5 MG capsule Take 2.5 mg by mouth every evening.    . ramipril (ALTACE) 5 MG capsule TAKE ONE CAPSULE EACH DAY 90 capsule 3  . spironolactone (ALDACTONE) 25 MG tablet Take 1 tablet (25 mg total) by mouth at bedtime. 90 tablet   . tamsulosin (FLOMAX) 0.4 MG CAPS capsule Take 0.4 mg by mouth at bedtime.     . timolol (TIMOPTIC) 0.5 % ophthalmic solution Place 1 drop into the right eye daily.   5  . warfarin (COUMADIN) 3 MG tablet TAKE AS DIRECTED BY COUMADIN CLINIC 80 tablet 1  . metoprolol succinate (TOPROL-XL) 25 MG 24 hr tablet Take 1 tablet (25 mg total) by mouth daily. Take with or immediately following a meal. 30 tablet 3   No current facility-administered medications  for this encounter.     BP 124/69   Pulse (!) 104   Wt 90.9 kg (200 lb 6.4 oz)   SpO2 96%   BMI 28.35 kg/m   General: NAD Neck: JVP 8-9 cm, no thyromegaly or thyroid nodule.  Lungs: Clear to auscultation bilaterally with normal respiratory effort. CV: Nondisplaced PMI.  Heart mildly tachy, irregular S1/S2, no S3/S4, no murmur.  1+ edema 1/2 to knees bilaterally.  No carotid bruit.  Unable to palpate pedal pulses.  Abdomen: Soft, nontender, no hepatosplenomegaly, no distention.  Skin: Intact without lesions or rashes.  Neurologic: Alert and oriented x 3.  Psych: Normal affect. Extremities: No clubbing or cyanosis.  HEENT: Normal.   Assessment/Plan:  1. CAD: s/p CABG and redo CABG.  Given fall in EF, I did a cardiac cath in 7/15 as documented above. Unfortunately, despite significant coronary disease, there were no good interventional targets.  He has occasional atypical chest pain.  - Continue atorvastatin, Zetia, and Repatha.  - He is on warfarin in setting of stable CAD so does not need ASA 81. CBC today.  2. Hyperlipidemia: He is taking atorvastatin + Zetia and Repatha.  Last LDL was excellent in 11/19.  He has missed a couple of Repatha doses recently.  - Continue current atorvastatin and Zetia. - He needs to restart Repatha (has at home).   3. PAD: Patient denies claudication or foot ulcers. Peripheral arterial dopplers showed > 50% bilateral focal SFA stenoses in the past.  He is not a cilostazol candidate with CHF. He is followed by Dr. Trula Slade at [facility].  4. Carotid stenosis: S/p right CEA in 2/19.  Followed by Dr. Trula Slade at [facility].  5. AAA: Successful endovascular repair in 5/15.  6. Bioprosthetic AVR: Valve stable on 1/19 echo.  Will reassess by TEE (see below).  7. Atrial fibrillation: He is back in atrial fibrillation with RVR, has developed CHF in setting of atrial fibrillation.  Unclear how long he has been in it, at least 5 days based on symptomatic worsening.  He has been off  amiodarone given possible CNS side effects but has not had symptomatic atrial fibrillation since 1/19.  Given development of CHF with atrial fibrillation, I think that he needs conversion to NSR and would like to arrange to do this soon.  Unfortunately, last INR was 1.4.  Tikosyn would be an option in the future.  - Add Toprol XL 25 mg daily for rate control.  - Continue warfarin.  - I will check an INR today and arrange for TEE-guided DCCV on Thursday.  He will get a point of care INR in the morning Thursday before TEE-guided DCCV to make sure he is therapeutic on warfarin.  He will need a prior COVID-19 test.  8. Chronic primarily diastolic CHF: Suspect ischemic cardiomyopathy.  EF 25-30% in 6/15 but had normalized to 55-60% by 1/16.  Down to 45-50% on 1/19 echo but was in the setting of atrial fibrillation.  He has recurrent atrial fibrillation with worsening CHF.  He is volume overloaded on exam, NYHA class III symptoms.   - I will reassess LV/RV function by TEE.  - Increase Lasix to 40 mg daily. BMET today and in 10 days.  - Starting Toprol XL as above.  - Continue ramipril 5 qam/2.5 qpm.  - Continue spironolactone 25 mg daily.  9. HTN: BP controlled.    10. Diabetes: He is on Jardiance.    Followup in 2 wks after DCCV.   [name] 05/24/2019

## 2019-05-24 NOTE — Telephone Encounter (Signed)
Called patient to review results of INR drawn at HF clinic yesterday. Left message for patient to return call. INR 3.5. patient is to have cardioversion tomorrow. Need to make sure patient is >2 tomorrow, but also dont want patient to be too high. Will advised patient to either eat a serving of greens today OR take 2 tablets tonight instead of 3 (but not both).

## 2019-05-24 NOTE — Telephone Encounter (Signed)
Received call from Endo stating patients procedure will possibly be cancelled due to covid screening done this morning and may not result in time. Per Director Karena Addison, some results do come back at 24hrs.  Per Dr Aundra Dubin will leave patient on schedule and will continue to check results into tomorrow morning. Spoke with patient because possibility of reschedule to Friday.  Pt states that he has possible urology procedure but he is unsure if he will have it because he hasnt heard anything back from them.  I attempted to call urology at [facility] with no answer. LM on vm of Dr Rosana Hoes nurse.

## 2019-05-24 NOTE — Progress Notes (Signed)
Patient ID: Nathaniel [name]., male   DOB: 05/25/1932, 85 y.o.   MRN: [id number] PCP: Dr. Elyse Hsu Cardiology: Dr. Aundra Dubin  85 y.o. with history of extensive vascular disease including CAD s/p CABG and redo CABG, AAA s/p repair, PAD, and carotid stenosis as well as prior AVR presents for followup of CAD and atrial fibrillation.   He has had documentation of significant peripheral arterial disease.  He gets bilateral calf soreness after walking for about 15 minutes.  This resolves with rest, no rest pain.  No pedal ulcers, though when he gets cuts on his feet, they heal slowly.  Peripheral arterial dopplers done in 10/14 showed > 50% focal SFA stenoses bilaterally.  He has carotid artery disease.  No stroke-like symptoms.  He had a endovascular AAA repair in 5/15.    After his AAA repair, he was noted to be in atrial fibrillation.  He had had a prior episode of atrial fibrillation after his cardiac surgery in 2007.  Atrial fibrillation was rate-controlled.  He was started on warfarin and Toprol XL.  He developed exertional dyspnea and fatigue in atrial fibrillation. I cardioverted him back to NSR on 06/06/14.  I also had him get an echocardiogram in 6/15.  This showed EF worsened to 25-30% with stable bioprosthetic aortic valve, mildly dilated RV with normal systolic function.  Given the fall in EF, I took him for [facility] in 7/15.  This showed stable anatomy.  The LIMA-LAD was patent, the sequential SVG-OM/ramus/D was patent only to ramus but this is the same as the prior cath, and the SVG-PDA was patent with a long 50-60% mid-graft stenosis.  No intervention.  Of note, at cath he was noted to be back in atrial fibrillation.  I then started him on amiodarone.  I took Mr Longenecker for [place] again on 08/21/14.  This was successful.  He has been off amiodarone due to side effects (hallucinations).  Echo in 1/16 showed recovery of EF to 55-60%.    He was admitted in 1/19 with bronchitis/wheezing/dyspnea and was found to be in  atrial fibrillation with RVR.  He tested positive for metapneumovirus.  Echo showed EF 45-50% with normal bioprosthetic aortic valve. He was diuresed for volume overload while in the hospital. He had DCCV to NSR.    Carotid dopplers in 1/19 showed 80-99% RICA stenosis, he had right CEA in 83 with no complications.   He has felt like his energy level has been worse for several months.  He has been getting short of breath after walking 12-15 minutes.  However, for the last 5 days, his exertional dyspnea has significantly worsened.  He has noted palpitations/faster HR.  He will get rare chest tightness (across chest) with no particular trigger.  No exertional chest pain.  No lightheadedness or syncope. Last INR was low at 1.4.   ECG: atrial fibrillation at 115, inferolateral TWIs  Labs (5/15): K 4.3, creatinine 0.89 Labs (6/15): K 4.3, creatinine 1.4, BNP 112 Labs (7/15): K 4.4, creatinine 1.2, HCT 40.2 Labs (8/15): K 4, creatinine 1.2, BNP 113, LFTs and TSH normal Labs (10/15): K 4.4, creatinine 1.4 Labs (5/16): K 4.7, creatinine 1.18, HDL 52, LDL 98 Labs (6/16): LDL 118, LFTs normal Labs (1/17): HCT 40.2, K 3.7, creatinine 1.04 Labs (1/19): K 4.9, creatinine 1.19 Labs (2/19): K 4.2, creatinine 0.89 Labs (3/19): LDL 86, HDL 45, TGs 245 Labs (5/19): LDL 110, HDL 39 Labs (6/19): K 4.1, creatinine 1.07 Labs (11/19): LDL 14, HDL 56  PMH: 1. PAD: Peripheral arterial dopplers in 2012 showed > 75% bilateral SFA stenosis. Peripheral arterial dopplers in 10/14 showed > 50% focal bilateral SFA stenoses.  2. AAA: [place] 4/15 with 4.4 cm AAA but concern for penetrating ulcers. CTA abdomen showed 4.4 cm AAA with penetrating ulcers and possible pseudoaneurysms. Now s/p endovascular repair of AAA in 83 with no complications.  3. Carotid stenosis: Carotid dopplers (4/15) with 60-79% bilateral ICA stenosis. Carotid dopplers (11/15) with 60-79% bilateral ICA stenosis.  Carotid dopplers 6/16 with 60-79%  bilateral ICA stenosis. - Carotid dopplers (1/18): 60-79% RICA stenosis.  - Carotid dopplers (1/19): 80-99% RICA stenosis => Right CEA in 2/19.  - Carotid dopplers (11/19): 40-59% bilateral stenosis.  4. CAD: CABG 1989 with LIMA-LAD, SVG-D, seq SVG-ramus and OM, sequential SVG-PDA/PLV. SVG-PDA/PLV found to be occluded on cath prior to AVR, so patient had SVG-PDA with AVR in 2007. LHC (7/15) with LIMA-LAD patent with 40-50% stenosis in LAD after touchdown, sequential SVG-ramus/OM/diagonal with only the ramus branch still intact (known from prior cath), SVG-PDA from original surgery TO, SVG-PDA from redo surgery with long 50-60% mid-graft stenosis.  No target for intervention.  5. Severe aortic stenosis: Bioprosthetic AVR in 2007. Echo (11/10) with EF 60-65%, basal to mid inferior hypokinesis, bioprosthetic aortic valve without significant stenosis/regurgitation, mild MR. Echo (11/14) with EF 55-60%, mild LVH, bioprosthetic aortic valve looked ok. Bioprosthetic valve looked ok on 6/15 echo.   6. Atrial fibrillation: Paroxysmal. Initially noted after cardiac surgery, then again after AAA repair.  DCCV to NSR 06/06/14.  DCCV to NSR again 08/21/14.  - DCCV to NSR in 1/19.  7. Type II diabetes  8. Gout  9. GERD  10. Hyperlipidemia  11. HTN 12. GERD  13. Chronic systolic CHF: Echo (1/32) with EF worsened to 25-30% with grade II diastolic dysfunction, stable bioprosthetic aortic valve, mildly dilated RV with normal systolic function.  Echo (1/16) with EF 55-60%, basal inferior hypokinesis, mild LVH, bioprosthetic aortic valve with mean gradient 13 mmHg, mild MR, severe LAE.  - Echo (1/19): EF 45-50%, mild MR, normal bioprosthetic aortic valve.  14. BPH  15. Lung nodules: Followed by pulmonary.   SH: Widower, 2 children, raised his 3 grand-daughters, lives in [place], [place].  Occasional ETOH, no smoking.   FH: Father died from ruptured AAA.   ROS: All systems reviewed and negative except as per  HPI.   Current Outpatient Medications  Medication Sig Dispense Refill  . allopurinol (ZYLOPRIM) 300 MG tablet Take 300 mg by mouth daily.   2  . atorvastatin (LIPITOR) 10 MG tablet TAKE ONE TABLET BY MOUTH ONCE DAILY 30 tablet 10  . atorvastatin (LIPITOR) 20 MG tablet TAKE ONE TABLET EVERY OTHER DAY 30 tablet 0  . Coenzyme Q10 (CO Q10) 100 MG CAPS Take 100 mg by mouth daily.    Marland Kitchen doxazosin (CARDURA) 1 MG tablet Take 1 mg by mouth at bedtime.    Marland Kitchen doxazosin (CARDURA) 2 MG tablet Take 2 mg by mouth daily. In the morning.    . empagliflozin (JARDIANCE) 10 MG TABS tablet Take by mouth.    . Evolocumab (REPATHA SURECLICK) 440 MG/ML SOAJ Inject 1 pen into the skin every 14 (fourteen) days. 2 pen 11  . ezetimibe (ZETIA) 10 MG tablet Take 10 mg by mouth at bedtime.     . finasteride (PROSCAR) 5 MG tablet Take 1 tablet (5 mg total) by mouth at bedtime. 90 tablet 3  . furosemide (LASIX) 20 MG tablet Take 2 tablets (  40 mg total) by mouth daily. 60 tablet 3  . Glucosamine HCl-MSM (GLUCOSAMINE-MSM PO) Take 1 tablet by mouth daily.    Marland Kitchen latanoprost (XALATAN) 0.005 % ophthalmic solution Instill 1 drop into the affected eye at bedtime    . Multiple Vitamins-Minerals (OCUVITE EXTRA PO) Take 1 tablet by mouth daily.    . ramipril (ALTACE) 2.5 MG capsule Take 2.5 mg by mouth every evening.    . ramipril (ALTACE) 5 MG capsule TAKE ONE CAPSULE EACH DAY 90 capsule 3  . spironolactone (ALDACTONE) 25 MG tablet Take 1 tablet (25 mg total) by mouth at bedtime. 90 tablet   . tamsulosin (FLOMAX) 0.4 MG CAPS capsule Take 0.4 mg by mouth at bedtime.     . timolol (TIMOPTIC) 0.5 % ophthalmic solution Place 1 drop into the right eye daily.   5  . warfarin (COUMADIN) 3 MG tablet TAKE AS DIRECTED BY COUMADIN CLINIC 80 tablet 1  . metoprolol succinate (TOPROL-XL) 25 MG 24 hr tablet Take 1 tablet (25 mg total) by mouth daily. Take with or immediately following a meal. 30 tablet 3   No current facility-administered medications  for this encounter.     BP 124/69   Pulse (!) 104   Wt 90.9 kg (200 lb 6.4 oz)   SpO2 96%   BMI 28.35 kg/m   General: NAD Neck: JVP 8-9 cm, no thyromegaly or thyroid nodule.  Lungs: Clear to auscultation bilaterally with normal respiratory effort. CV: Nondisplaced PMI.  Heart mildly tachy, irregular S1/S2, no S3/S4, no murmur.  1+ edema 1/2 to knees bilaterally.  No carotid bruit.  Unable to palpate pedal pulses.  Abdomen: Soft, nontender, no hepatosplenomegaly, no distention.  Skin: Intact without lesions or rashes.  Neurologic: Alert and oriented x 3.  Psych: Normal affect. Extremities: No clubbing or cyanosis.  HEENT: Normal.   Assessment/Plan:  1. CAD: s/p CABG and redo CABG.  Given fall in EF, I did a cardiac cath in 7/15 as documented above. Unfortunately, despite significant coronary disease, there were no good interventional targets.  He has occasional atypical chest pain.  - Continue atorvastatin, Zetia, and Repatha.  - He is on warfarin in setting of stable CAD so does not need ASA 81. CBC today.  2. Hyperlipidemia: He is taking atorvastatin + Zetia and Repatha.  Last LDL was excellent in 11/19.  He has missed a couple of Repatha doses recently.  - Continue current atorvastatin and Zetia. - He needs to restart Repatha (has at home).   3. PAD: Patient denies claudication or foot ulcers. Peripheral arterial dopplers showed > 50% bilateral focal SFA stenoses in the past.  He is not a cilostazol candidate with CHF. He is followed by Dr. Trula Slade at [facility].  4. Carotid stenosis: S/p right CEA in 2/19.  Followed by Dr. Trula Slade at [facility].  5. AAA: Successful endovascular repair in 5/15.  6. Bioprosthetic AVR: Valve stable on 1/19 echo.  Will reassess by TEE (see below).  7. Atrial fibrillation: He is back in atrial fibrillation with RVR, has developed CHF in setting of atrial fibrillation.  Unclear how long he has been in it, at least 5 days based on symptomatic worsening.  He has been off  amiodarone given possible CNS side effects but has not had symptomatic atrial fibrillation since 1/19.  Given development of CHF with atrial fibrillation, I think that he needs conversion to NSR and would like to arrange to do this soon.  Unfortunately, last INR was 1.4.  Tikosyn would be an option in the future.  - Add Toprol XL 25 mg daily for rate control.  - Continue warfarin.  - I will check an INR today and arrange for TEE-guided DCCV on Thursday.  He will get a point of care INR in the morning Thursday before TEE-guided DCCV to make sure he is therapeutic on warfarin.  He will need a prior COVID-19 test.  8. Chronic primarily diastolic CHF: Suspect ischemic cardiomyopathy.  EF 25-30% in 6/15 but had normalized to 55-60% by 1/16.  Down to 45-50% on 1/19 echo but was in the setting of atrial fibrillation.  He has recurrent atrial fibrillation with worsening CHF.  He is volume overloaded on exam, NYHA class III symptoms.   - I will reassess LV/RV function by TEE.  - Increase Lasix to 40 mg daily. BMET today and in 10 days.  - Starting Toprol XL as above.  - Continue ramipril 5 qam/2.5 qpm.  - Continue spironolactone 25 mg daily.  9. HTN: BP controlled.    10. Diabetes: He is on Jardiance.    Followup in 2 wks after DCCV.   [name] 05/24/2019

## 2019-05-24 NOTE — Telephone Encounter (Signed)
F/U Message            Patient returned Nathaniel [name] call would like a call back.

## 2019-05-25 ENCOUNTER — Ambulatory Visit (HOSPITAL_COMMUNITY)
Admission: RE | Admit: 2019-05-25 | Discharge: 2019-05-25 | Disposition: A | Discharge status: Home / Self Care | Payer: Medicare Other | Attending: Cardiology | Admitting: Cardiology

## 2019-05-25 ENCOUNTER — Encounter (HOSPITAL_COMMUNITY)
Admission: RE | Disposition: A | Discharge status: Home / Self Care | Payer: Self-pay | Source: Home / Self Care | Attending: Cardiology

## 2019-05-25 ENCOUNTER — Ambulatory Visit (HOSPITAL_COMMUNITY): Payer: Medicare Other | Admitting: Certified Registered Nurse Anesthetist

## 2019-05-25 ENCOUNTER — Ambulatory Visit (HOSPITAL_BASED_OUTPATIENT_CLINIC_OR_DEPARTMENT_OTHER): Payer: Medicare Other

## 2019-05-25 ENCOUNTER — Encounter (HOSPITAL_COMMUNITY): Payer: Self-pay | Admitting: *Deleted

## 2019-05-25 ENCOUNTER — Other Ambulatory Visit: Payer: Self-pay

## 2019-05-25 ENCOUNTER — Telehealth (HOSPITAL_COMMUNITY): Payer: Self-pay

## 2019-05-25 ENCOUNTER — Ambulatory Visit (INDEPENDENT_AMBULATORY_CARE_PROVIDER_SITE_OTHER): Payer: Medicare Other | Admitting: *Deleted

## 2019-05-25 DIAGNOSIS — I4891 Unspecified atrial fibrillation: Secondary | ICD-10-CM

## 2019-05-25 DIAGNOSIS — Z951 Presence of aortocoronary bypass graft: Secondary | ICD-10-CM | POA: Insufficient documentation

## 2019-05-25 DIAGNOSIS — I340 Nonrheumatic mitral (valve) insufficiency: Secondary | ICD-10-CM

## 2019-05-25 DIAGNOSIS — Z7901 Long term (current) use of anticoagulants: Secondary | ICD-10-CM | POA: Diagnosis not present

## 2019-05-25 DIAGNOSIS — I480 Paroxysmal atrial fibrillation: Secondary | ICD-10-CM | POA: Diagnosis not present

## 2019-05-25 DIAGNOSIS — K219 Gastro-esophageal reflux disease without esophagitis: Secondary | ICD-10-CM | POA: Diagnosis not present

## 2019-05-25 DIAGNOSIS — I739 Peripheral vascular disease, unspecified: Secondary | ICD-10-CM | POA: Insufficient documentation

## 2019-05-25 DIAGNOSIS — Z79899 Other long term (current) drug therapy: Secondary | ICD-10-CM | POA: Diagnosis not present

## 2019-05-25 DIAGNOSIS — E119 Type 2 diabetes mellitus without complications: Secondary | ICD-10-CM | POA: Insufficient documentation

## 2019-05-25 DIAGNOSIS — I447 Left bundle-branch block, unspecified: Secondary | ICD-10-CM | POA: Insufficient documentation

## 2019-05-25 DIAGNOSIS — Z953 Presence of xenogenic heart valve: Secondary | ICD-10-CM | POA: Diagnosis not present

## 2019-05-25 DIAGNOSIS — Z5181 Encounter for therapeutic drug level monitoring: Secondary | ICD-10-CM

## 2019-05-25 DIAGNOSIS — M109 Gout, unspecified: Secondary | ICD-10-CM | POA: Insufficient documentation

## 2019-05-25 DIAGNOSIS — I6523 Occlusion and stenosis of bilateral carotid arteries: Secondary | ICD-10-CM | POA: Insufficient documentation

## 2019-05-25 DIAGNOSIS — I714 Abdominal aortic aneurysm, without rupture: Secondary | ICD-10-CM | POA: Insufficient documentation

## 2019-05-25 DIAGNOSIS — I110 Hypertensive heart disease with heart failure: Secondary | ICD-10-CM | POA: Insufficient documentation

## 2019-05-25 DIAGNOSIS — I5042 Chronic combined systolic (congestive) and diastolic (congestive) heart failure: Secondary | ICD-10-CM | POA: Diagnosis not present

## 2019-05-25 DIAGNOSIS — Z8249 Family history of ischemic heart disease and other diseases of the circulatory system: Secondary | ICD-10-CM | POA: Insufficient documentation

## 2019-05-25 DIAGNOSIS — E785 Hyperlipidemia, unspecified: Secondary | ICD-10-CM | POA: Insufficient documentation

## 2019-05-25 DIAGNOSIS — I2510 Atherosclerotic heart disease of native coronary artery without angina pectoris: Secondary | ICD-10-CM | POA: Diagnosis not present

## 2019-05-25 DIAGNOSIS — N400 Enlarged prostate without lower urinary tract symptoms: Secondary | ICD-10-CM | POA: Diagnosis not present

## 2019-05-25 HISTORY — PX: CARDIOVERSION: SHX1299

## 2019-05-25 HISTORY — PX: TEE WITHOUT CARDIOVERSION: SHX5443

## 2019-05-25 LAB — NOVEL CORONAVIRUS, NAA (HOSP ORDER, SEND-OUT TO REF LAB; TAT 18-24 HRS): SARS-CoV-2, NAA: NOT DETECTED

## 2019-05-25 LAB — POCT INR: INR: 4 — AB (ref 2.0–3.0)

## 2019-05-25 LAB — GLUCOSE, CAPILLARY: Glucose-Capillary: 108 mg/dL — ABNORMAL HIGH (ref 70–99)

## 2019-05-25 SURGERY — ECHOCARDIOGRAM, TRANSESOPHAGEAL
Anesthesia: General

## 2019-05-25 MED ORDER — PHENYLEPHRINE 40 MCG/ML (10ML) SYRINGE FOR IV PUSH (FOR BLOOD PRESSURE SUPPORT)
PREFILLED_SYRINGE | INTRAVENOUS | Status: DC | PRN
  Administered 2019-05-25: 120 ug via INTRAVENOUS
  Administered 2019-05-25: 40 ug via INTRAVENOUS
  Administered 2019-05-25 (×4): 80 ug via INTRAVENOUS

## 2019-05-25 MED ORDER — BUTAMBEN-TETRACAINE-BENZOCAINE 2-2-14 % EX AERO
INHALATION_SPRAY | CUTANEOUS | Status: DC | PRN
  Administered 2019-05-25: 13:00:00 2 via TOPICAL

## 2019-05-25 MED ORDER — PROPOFOL 10 MG/ML IV BOLUS
INTRAVENOUS | Status: DC | PRN
  Administered 2019-05-25 (×2): 20 mg via INTRAVENOUS

## 2019-05-25 MED ORDER — SODIUM CHLORIDE 0.9 % IV SOLN
INTRAVENOUS | Status: DC
  Administered 2019-05-25: 13:00:00 via INTRAVENOUS

## 2019-05-25 MED ORDER — PROPOFOL 500 MG/50ML IV EMUL
INTRAVENOUS | Status: DC | PRN
  Administered 2019-05-25: 100 ug/kg/min via INTRAVENOUS

## 2019-05-25 NOTE — Patient Instructions (Addendum)
Description    Hold today, then resume normal dose of 3 tablets daily except 2 tablets on Mondays and Fridays. Recheck in 1 week. Call us with any new medications or concerns: [facility] # [phone], Main # [phone].

## 2019-05-25 NOTE — Interval H&P Note (Signed)
History and Physical Interval Note:  05/25/2019 1:07 PM  [place].  has presented today for surgery, with the diagnosis of ATRIAL FIBRILLATION.  The various methods of treatment have been discussed with the patient and family. After consideration of risks, benefits and other options for treatment, the patient has consented to  Procedure(s): TRANSESOPHAGEAL ECHOCARDIOGRAM (TEE) (N/A) CARDIOVERSION (N/A) as a surgical intervention.  The patient's history has been reviewed, patient examined, no change in status, stable for surgery.  I have reviewed the patient's chart and labs.  Questions were answered to the patient's satisfaction.     D[place] [employer]

## 2019-05-25 NOTE — Telephone Encounter (Signed)
Spoke with patient, he is aware his Covid screen is negative. Pt is at coumadin clinic this morning getting his INR checked. Will be on the look out for results. Pt aware of same.

## 2019-05-25 NOTE — Telephone Encounter (Signed)
Called patient to let him know per coumadin clinic note, based on todays INR level he is to hold Coumadin tonight and resume tomorrow at his normal dosing schedule. Pt did not pick up and his VM is full so I could not leave a message. Spoke with coumadin clinic, they report that he was told same at his visit this morning during check of INR.  Per Dr. Aundra Dubin, information needed to be reiterated. Will ask clinic staff to call again before end of clinic day.

## 2019-05-25 NOTE — Telephone Encounter (Signed)
MAILBOX FULL, UNABLE TO LEAVE MESSAGE

## 2019-05-25 NOTE — Anesthesia Postprocedure Evaluation (Signed)
Anesthesia Post Note  Patient: Nathaniel [name].  Procedure(s) Performed: TRANSESOPHAGEAL ECHOCARDIOGRAM (TEE) (N/A ) CARDIOVERSION (N/A )     Patient location during evaluation: PACU Anesthesia Type: General Level of consciousness: awake and alert Pain management: pain level controlled Vital Signs Assessment: post-procedure vital signs reviewed and stable Respiratory status: spontaneous breathing, nonlabored ventilation, respiratory function stable and patient connected to nasal cannula oxygen Cardiovascular status: blood pressure returned to baseline and stable Postop Assessment: no apparent nausea or vomiting Anesthetic complications: no    Last Vitals:  Vitals:   05/25/19 1339 05/25/19 1349  BP: (!) 108/53 (!) 93/56  Pulse:  74  Resp: 20 (!) 21  Temp:    SpO2: 100% 100%    Last Pain:  Vitals:   05/25/19 1349  TempSrc:   PainSc: 0-No pain                 Effie Berkshire

## 2019-05-25 NOTE — Telephone Encounter (Signed)
Pt in clinic for INR draw today as well - see separate anticoag encounter.

## 2019-05-25 NOTE — Transfer of Care (Signed)
Immediate Anesthesia Transfer of Care Note  Patient: Nathaniel [name].  Procedure(s) Performed: TRANSESOPHAGEAL ECHOCARDIOGRAM (TEE) (N/A ) CARDIOVERSION (N/A )  Patient Location: Endoscopy Unit  Anesthesia Type:MAC  Level of Consciousness: awake, alert , oriented and patient cooperative  Airway & Oxygen Therapy: Patient Spontanous Breathing and Patient connected to nasal cannula oxygen  Post-op Assessment: Report given to RN and Post -op Vital signs reviewed and stable  Post vital signs: Reviewed and stable  Last Vitals:  Vitals Value Taken Time  BP 87/41 05/25/19 1330  Temp    Pulse 58 05/25/19 1331  Resp 23 05/25/19 1331  SpO2 97 % 05/25/19 1331  Vitals shown include unvalidated device data.  Last Pain:  Vitals:   05/25/19 1128  TempSrc: Oral  PainSc: 0-No pain         Complications: No apparent anesthesia complications

## 2019-05-25 NOTE — Procedures (Signed)
Electrical Cardioversion Procedure Note Kolyn Rozario [id number] 1932-01-06  Procedure: Electrical Cardioversion Indications:  Atrial Fibrillation  Procedure Details Consent: Risks of procedure as well as the alternatives and risks of each were explained to the (patient/caregiver).  Consent for procedure obtained. Time Out: Verified patient identification, verified procedure, site/side was marked, verified correct patient position, special equipment/implants available, medications/allergies/relevent history reviewed, required imaging and test results available.  Performed  Patient placed on cardiac monitor, pulse oximetry, supplemental oxygen as necessary.  Sedation given: Propofol per anesthesiology Pacer pads placed anterior and posterior chest.  Cardioverted 1 time(s).  Cardioverted at [place] [place].  Evaluation Findings: Post procedure EKG shows: NSR Complications: None Patient did tolerate procedure well.   [name] 05/25/2019, 1:20 PM

## 2019-05-25 NOTE — CV Procedure (Signed)
Procedure: TEE  Sedation: Per anesthesiology.    Indication: Atrial fibrillation.   Findings: Please see echo section for report.  No LA thrombus noted, may proceed to DCCV.   Nathaniel [name] 05/25/2019 1:20 PM

## 2019-05-25 NOTE — Progress Notes (Signed)
  Echocardiogram Echocardiogram Transesophageal has been performed.  Jennette Dubin 05/25/2019, 1:44 PM

## 2019-05-25 NOTE — Anesthesia Preprocedure Evaluation (Addendum)
Anesthesia Evaluation  Patient identified by MRN, date of birth, ID band Patient awake    Reviewed: Allergy & Precautions, Patient's Chart, lab work & pertinent test results  Airway Mallampati: I  TM Distance: >3 FB Neck ROM: Full    Dental  (+) Teeth Intact, Dental Advisory Given   Pulmonary former smoker,    breath sounds clear to auscultation       Cardiovascular hypertension, Pt. on home beta blockers and Pt. on medications + CAD, + CABG, + Peripheral Vascular Disease and +CHF  + dysrhythmias Atrial Fibrillation  Rhythm:Irregular Rate:Abnormal  - s/p AVR   Neuro/Psych negative neurological ROS  negative psych ROS   GI/Hepatic GERD  ,  Endo/Other  diabetes  Renal/GU      Musculoskeletal negative musculoskeletal ROS (+)   Abdominal Normal abdominal exam  (+)   Peds  Hematology negative hematology ROS (+)   Anesthesia Other Findings   Reproductive/Obstetrics                            Anesthesia Physical Anesthesia Plan  ASA: III  Anesthesia Plan: General   Post-op Pain Management:    Induction:   PONV Risk Score and Plan: 0 and Ondansetron  Airway Management Planned: Natural Airway and Nasal Cannula  Additional Equipment: None  Intra-op Plan:   Post-operative Plan:   Informed Consent: I have reviewed the patients History and Physical, chart, labs and discussed the procedure including the risks, benefits and alternatives for the proposed anesthesia with the patient or authorized representative who has indicated his/her understanding and acceptance.     Dental advisory given  Plan Discussed with: CRNA  Anesthesia Plan Comments:        Anesthesia Quick Evaluation

## 2019-05-25 NOTE — Telephone Encounter (Signed)
INR 4.0  Dr Aundra Dubin aware of results. LM on patient VM of same and to check in to hospital at 11:15a for TEE/DCCV.

## 2019-05-25 NOTE — Telephone Encounter (Signed)
Pt called back, advised him about holding Coumadin tonight and restarting tomorrow, he is aware, agreeable and verbalizes understanding

## 2019-05-25 NOTE — Discharge Instructions (Signed)
TEE  YOU HAD AN CARDIAC PROCEDURE TODAY: Refer to the procedure report and other information in the discharge instructions given to you for any specific questions about what was found during the examination. If this information does not answer your questions, please call [facility] office at [phone] to clarify.   DIET: Your first meal following the procedure should be a light meal and then it is ok to progress to your normal diet. A half-sandwich or bowl of soup is an example of a good first meal. Heavy or fried foods are harder to digest and may make you feel nauseous or bloated. Drink plenty of fluids but you should avoid alcoholic beverages for 24 hours. If you had a esophageal dilation, please see attached instructions for diet.   ACTIVITY: Your care partner should take you home directly after the procedure. You should plan to take it easy, moving slowly for the rest of the day. You can resume normal activity the day after the procedure however YOU SHOULD NOT DRIVE, use power tools, machinery or perform tasks that involve climbing or major physical exertion for 24 hours (because of the sedation medicines used during the test).   SYMPTOMS TO REPORT IMMEDIATELY: A cardiologist can be reached at any hour. Please call [phone] for any of the following symptoms:  Vomiting of blood or coffee ground material  New, significant abdominal pain  New, significant chest pain or pain under the shoulder blades  Painful or persistently difficult swallowing  New shortness of breath  Black, tarry-looking or red, bloody stools  FOLLOW UP:  Please also call with any specific questions about appointments or follow up tests.  Electrical Cardioversion, Care After This sheet gives you information about how to care for yourself after your procedure. Your health care provider may also give you more specific instructions. If you have problems or questions, contact your health care provider. What can I  expect after the procedure? After the procedure, it is common to have:  Some redness on the skin where the shocks were given. Follow these instructions at home:   Do not drive for 24 hours if you were given a medicine to help you relax (sedative).  Take over-the-counter and prescription medicines only as told by your health care provider.  Ask your health care provider how to check your pulse. Check it often.  Rest for 48 hours after the procedure or as told by your health care provider.  Avoid or limit your caffeine use as told by your health care provider. Contact a health care provider if:  You feel like your heart is beating too quickly or your pulse is not regular.  You have a serious muscle cramp that does not go away. Get help right away if:   You have discomfort in your chest.  You are dizzy or you feel faint.  You have trouble breathing or you are short of breath.  Your speech is slurred.  You have trouble moving an arm or leg on one side of your body.  Your fingers or toes turn cold or blue. This information is not intended to replace advice given to you by your health care provider. Make sure you discuss any questions you have with your health care provider. Document Released: 09/20/2013 Document Revised: 07/03/2016 Document Reviewed: 06/05/2016 Elsevier Interactive Patient Education  2019 [employer].

## 2019-05-25 NOTE — Telephone Encounter (Signed)
Patient returned call and aware of INR results. Reports he will report to hospital @ 11am for TEE/DCCV

## 2019-05-26 ENCOUNTER — Encounter (HOSPITAL_COMMUNITY): Payer: Self-pay | Admitting: Cardiology

## 2019-05-26 ENCOUNTER — Telehealth (HOSPITAL_COMMUNITY): Payer: Self-pay

## 2019-05-26 DIAGNOSIS — I5022 Chronic systolic (congestive) heart failure: Secondary | ICD-10-CM

## 2019-05-26 DIAGNOSIS — R310 Gross hematuria: Secondary | ICD-10-CM | POA: Diagnosis not present

## 2019-05-26 DIAGNOSIS — R82998 Other abnormal findings in urine: Secondary | ICD-10-CM | POA: Diagnosis not present

## 2019-05-26 DIAGNOSIS — N401 Enlarged prostate with lower urinary tract symptoms: Secondary | ICD-10-CM | POA: Diagnosis not present

## 2019-05-26 DIAGNOSIS — N138 Other obstructive and reflux uropathy: Secondary | ICD-10-CM | POA: Diagnosis not present

## 2019-05-26 NOTE — Telephone Encounter (Signed)
Patient Name:  Nathaniel [name]        DOB: [birth year]      Height: 5'10    Weight:197lb  Office Name: [facility]          Referring Provider: Dr Aundra Dubin   Today's Date: 05/26/19  Date:  05/26/19 STOP BANG RISK ASSESSMENT S (snore) Have you been told that you snore?     YES   T (tired) Are you often tired, fatigued, or sleepy during the day?   YES  O (obstruction) Do you stop breathing, choke, or gasp during sleep? NO   P (pressure) Do you have or are you being treated for high blood pressure? NO   B (BMI) Is your body index greater than 35 kg/m? NO   A (age) Are you 83 years old or older? YES   N (neck) Do you have a neck circumference greater than 16 inches?   NA   G (gender) Are you a male? YES   TOTAL STOP/BANG "YES" ANSWERS                                                                        For Office Use Only              Procedure Order Form    YES to 3+ Stop Bang questions OR two clinical symptoms - patient qualifies for WatchPAT (CPT 95800)     Submit: This Form + Patient Face Sheet + Clinical Note via [employer] or Fax: [phone]         Clinical Notes: Will consult Sleep Specialist and refer for management of therapy due to patient increased risk of Sleep Apnea. Ordering a sleep study due to the following two clinical symptoms: Excessive daytime sleepiness G47.10 / Gastroesophageal reflux K21.9 / Nocturia R35.1 / Morning Headaches G44.221 / Difficulty concentrating R41.840 / Memory problems or poor judgment G31.84 / Personality changes or irritability R45.4 / Loud snoring R06.83 / Depression F32.9 / Unrefreshed by sleep G47.8 / Impotence N52.9 / History of high blood pressure R03.0 / Insomnia G47.00    I understand that I am proceeding with a home sleep apnea test as ordered by my treating physician. I understand that untreated sleep apnea is a serious cardiovascular risk factor and it is my responsibility to perform the test and seek management for  sleep apnea. I will be contacted with the results and be managed for sleep apnea by a local sleep physician. I will be receiving equipment and further instructions from [facility] I shall promptly ship back the equipment via the included mailing label. I understand my insurance will be billed for the test and as the patient I am responsible for any insurance related out-of-pocket costs incurred. I have been provided with written instructions and can call for additional video or telephonic instruction, with 24-hour availability of qualified personnel to answer any questions: Patient Help Desk [phone].  Patient Signature ______________________________________________________   Date______________________ Patient Telemedicine Verbal Consent

## 2019-05-26 NOTE — Telephone Encounter (Signed)
Spoke with office staff this morning, Nurse Nira Conn spoke with patient yesterday evening regarding holding coumadin lastnight.

## 2019-05-26 NOTE — Telephone Encounter (Signed)
LM for patient to return call to office regarding INR /coumadin management. See previous note

## 2019-05-26 NOTE — Telephone Encounter (Signed)
Called patient as he needs home sleep study per Dr Oleh Genin recommendation. No answer, and VM full. Will make another attempt

## 2019-05-26 NOTE — Telephone Encounter (Addendum)
STOPBANG assessment completed on patient.  Pt has a smart phone and understands to expect device in mail.  It will come with instructions and contact information should he have any questions. Documents (OV note, itamar order, assessment, and demographics) faxed via epic to [employer]

## 2019-05-27 ENCOUNTER — Other Ambulatory Visit (HOSPITAL_COMMUNITY): Payer: Self-pay | Admitting: Cardiology

## 2019-06-07 ENCOUNTER — Encounter (HOSPITAL_COMMUNITY): Payer: Self-pay | Admitting: Cardiology

## 2019-06-07 ENCOUNTER — Ambulatory Visit (HOSPITAL_COMMUNITY)
Admission: RE | Admit: 2019-06-07 | Discharge: 2019-06-07 | Disposition: A | Discharge status: Home / Self Care | Payer: Medicare Other | Source: Ambulatory Visit | Attending: Cardiology | Admitting: Cardiology

## 2019-06-07 ENCOUNTER — Other Ambulatory Visit: Payer: Self-pay

## 2019-06-07 ENCOUNTER — Telehealth: Payer: Self-pay | Admitting: Pharmacist

## 2019-06-07 ENCOUNTER — Encounter (HOSPITAL_COMMUNITY): Payer: Medicare Other | Admitting: Cardiology

## 2019-06-07 ENCOUNTER — Ambulatory Visit (HOSPITAL_BASED_OUTPATIENT_CLINIC_OR_DEPARTMENT_OTHER)
Admission: RE | Admit: 2019-06-07 | Discharge: 2019-06-07 | Disposition: A | Discharge status: Home / Self Care | Payer: Medicare Other | Source: Ambulatory Visit | Attending: Nurse Practitioner | Admitting: Nurse Practitioner

## 2019-06-07 ENCOUNTER — Encounter (HOSPITAL_COMMUNITY): Payer: Self-pay | Admitting: Nurse Practitioner

## 2019-06-07 VITALS — BP 140/82 | HR 98 | Wt 193.8 lb

## 2019-06-07 DIAGNOSIS — I509 Heart failure, unspecified: Secondary | ICD-10-CM | POA: Insufficient documentation

## 2019-06-07 DIAGNOSIS — Z952 Presence of prosthetic heart valve: Secondary | ICD-10-CM | POA: Insufficient documentation

## 2019-06-07 DIAGNOSIS — I480 Paroxysmal atrial fibrillation: Secondary | ICD-10-CM | POA: Diagnosis not present

## 2019-06-07 DIAGNOSIS — Z79899 Other long term (current) drug therapy: Secondary | ICD-10-CM | POA: Diagnosis not present

## 2019-06-07 DIAGNOSIS — I714 Abdominal aortic aneurysm, without rupture: Secondary | ICD-10-CM | POA: Diagnosis not present

## 2019-06-07 DIAGNOSIS — I739 Peripheral vascular disease, unspecified: Secondary | ICD-10-CM | POA: Diagnosis not present

## 2019-06-07 DIAGNOSIS — E119 Type 2 diabetes mellitus without complications: Secondary | ICD-10-CM | POA: Insufficient documentation

## 2019-06-07 DIAGNOSIS — E785 Hyperlipidemia, unspecified: Secondary | ICD-10-CM | POA: Diagnosis not present

## 2019-06-07 DIAGNOSIS — I4891 Unspecified atrial fibrillation: Secondary | ICD-10-CM

## 2019-06-07 DIAGNOSIS — I5032 Chronic diastolic (congestive) heart failure: Secondary | ICD-10-CM

## 2019-06-07 DIAGNOSIS — Z87891 Personal history of nicotine dependence: Secondary | ICD-10-CM | POA: Insufficient documentation

## 2019-06-07 DIAGNOSIS — M109 Gout, unspecified: Secondary | ICD-10-CM | POA: Diagnosis not present

## 2019-06-07 DIAGNOSIS — I2510 Atherosclerotic heart disease of native coronary artery without angina pectoris: Secondary | ICD-10-CM | POA: Diagnosis not present

## 2019-06-07 DIAGNOSIS — N189 Chronic kidney disease, unspecified: Secondary | ICD-10-CM | POA: Insufficient documentation

## 2019-06-07 DIAGNOSIS — Z7901 Long term (current) use of anticoagulants: Secondary | ICD-10-CM | POA: Insufficient documentation

## 2019-06-07 DIAGNOSIS — I130 Hypertensive heart and chronic kidney disease with heart failure and stage 1 through stage 4 chronic kidney disease, or unspecified chronic kidney disease: Secondary | ICD-10-CM | POA: Insufficient documentation

## 2019-06-07 DIAGNOSIS — Z951 Presence of aortocoronary bypass graft: Secondary | ICD-10-CM | POA: Diagnosis not present

## 2019-06-07 LAB — PROTIME-INR
INR: 2.3 — ABNORMAL HIGH (ref 0.8–1.2)
Prothrombin Time: 24.9 seconds — ABNORMAL HIGH (ref 11.4–15.2)

## 2019-06-07 LAB — COMPREHENSIVE METABOLIC PANEL
ALT: 17 U/L (ref 0–44)
AST: 25 U/L (ref 15–41)
Albumin: 3.9 g/dL (ref 3.5–5.0)
Alkaline Phosphatase: 58 U/L (ref 38–126)
Anion gap: 11 (ref 5–15)
BUN: 15 mg/dL (ref 8–23)
CO2: 21 mmol/L — ABNORMAL LOW (ref 22–32)
Calcium: 9.2 mg/dL (ref 8.9–10.3)
Chloride: 106 mmol/L (ref 98–111)
Creatinine, Ser: 1.18 mg/dL (ref 0.61–1.24)
GFR calc Af Amer: >60 mL/min (ref >60)
GFR calc non Af Amer: 56 mL/min — ABNORMAL LOW (ref >60)
Glucose, Bld: 159 mg/dL — ABNORMAL HIGH (ref 70–99)
Potassium: 4.1 mmol/L (ref 3.5–5.1)
Sodium: 138 mmol/L (ref 135–145)
Total Bilirubin: 1 mg/dL (ref 0.3–1.2)
Total Protein: 6.6 g/dL (ref 6.5–8.1)

## 2019-06-07 MED ORDER — AMIODARONE HCL 200 MG PO TABS: ORAL_TABLET | ORAL | 3 refills | Status: DC

## 2019-06-07 NOTE — Telephone Encounter (Signed)
Medication list reviewed in anticipation of upcoming Tikosyn initiation. Patient is not taking any contraindicated or QTc prolonging medications.   Patient is anticoagulated on warfarin. He has not had weekly INR checks, however per Dr Lovena Le pt underwent TEE on 6/18 and had INR drawn today 6/24 which was therapeutic at 2.3. As long as INR is in range on 6/29 the day of admission, Dr Lovena Le is ok for admission without weekly INRs. INR on 6/29 will be checked in afib clinic at his appt.  Patient will need to be counseled to avoid use of Benadryl while on Tikosyn and in the 2-3 days prior to Tikosyn initiation.

## 2019-06-07 NOTE — Progress Notes (Signed)
Primary Care Physician: Altheimer, [name], MD Referring Physician: Dr. Zachery Conch Juventino Pavone. is a 85 y.o. male with  extensive vascular disease including CAD s/p CABG and redo CABG, AAA s/p repair, PAD, and carotid stenosis as well as prior AVR , as well as h/o paroxysmal afib that is being seen in the afib clinic for consideration for Tikosyn admit.He was seen by Dr. Aundra Dubin this am and found to have ERAF after successful TEE/DCCV  6/11. Pt does  have RVR and has not tolerated BB's  for extreme fatigue and amiodarone in the remote past for suicidal ideations. Dr. 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   Today, he denies symptoms of palpitations, chest pain, shortness of breath, orthopnea, PND, lower extremity edema, dizziness, presyncope, syncope, or neurologic sequela. The patient is tolerating medications without difficulties and is otherwise without complaint today.   Past Medical History:  Diagnosis Date  . AAA (abdominal aortic aneurysm) ([place])   . Atrial fibrillation ([place])   . Atrial fibrillation ([place])   . CAD (coronary artery disease)   . Cataract   . CHF (congestive heart failure) ([place])   . Chronic kidney disease   . Colon polyps    adenomatous  . Diabetes mellitus   . Diverticulosis   . Dysrhythmia    A-Fib  . GERD (gastroesophageal reflux disease)   . Gout   . Hernia, abdominal   . History of aortic valve replacement with bioprosthetic valve    2007  . Hx: UTI (urinary tract infection)   . Hyperlipemia   . Hypertension   . Internal hemorrhoids   . Peripheral vascular disease ([place])   . Shortness of breath    with exertion   Past Surgical History:  Procedure Laterality Date  . ABDOMINAL AORTIC ENDOVASCULAR STENT GRAFT N/A 04/13/2014   Procedure: ABDOMINAL AORTIC ENDOVASCULAR STENT GRAFT;  Surgeon: Serafina Mitchell, MD;  Location: [place];  Service: Vascular;  Laterality: N/A;  . ANGIOPLASTY     Unsure if he had stents put in   . APPENDECTOMY    . CARDIAC VALVE SURGERY    . CARDIOVERSION N/A 06/06/2014   Procedure: CARDIOVERSION;  Surgeon: Larey Dresser, MD;  Location: [facility] ENDOSCOPY;  Service: Cardiovascular;  Laterality: N/A;  . CARDIOVERSION N/A 08/21/2014   Procedure: CARDIOVERSION;  Surgeon: Larey Dresser, MD;  Location: [facility] ENDOSCOPY;  Service: Cardiovascular;  Laterality: N/A;  . CARDIOVERSION N/A 12/22/2017   Procedure: CARDIOVERSION;  Surgeon: Larey Dresser, MD;  Location: [facility] ENDOSCOPY;  Service: Cardiovascular;  Laterality: N/A;  . CARDIOVERSION N/A 05/25/2019   Procedure: CARDIOVERSION;  Surgeon: Larey Dresser, MD;  Location: [facility] ENDOSCOPY;  Service: Cardiovascular;  Laterality: N/A;  . CORONARY ARTERY BYPASS GRAFT    . ENDARTERECTOMY Right 02/02/2018   Procedure: ENDARTERECTOMY CAROTID RIGHT;  Surgeon: Serafina Mitchell, MD;  Location: [place];  Service: Vascular;  Laterality: Right;  . EYE SURGERY Bilateral    cataract surgery  . HERNIA REPAIR     2 operations  . LEFT HEART CATHETERIZATION WITH CORONARY/GRAFT ANGIOGRAM N/A 07/10/2014   Procedure: LEFT HEART CATHETERIZATION WITH Beatrix Fetters;  Surgeon: Larey Dresser, MD;  Location: [facility] CATH LAB;  Service: Cardiovascular;  Laterality: N/A;  . TEE WITHOUT CARDIOVERSION N/A 05/25/2019  Procedure: TRANSESOPHAGEAL ECHOCARDIOGRAM (TEE);  Surgeon: Larey Dresser, MD;  Location: [facility] ENDOSCOPY;  Service: Cardiovascular;  Laterality: N/A;  . TONSILLECTOMY    . VIDEO BRONCHOSCOPY Bilateral 12/25/2015   Procedure: VIDEO BRONCHOSCOPY WITHOUT FLUORO;  Surgeon: Javier Glazier, MD;  Location: [place];  Service: Cardiopulmonary;  Laterality: Bilateral;    Current Outpatient Medications  Medication Sig Dispense Refill  . allopurinol (ZYLOPRIM) 300 MG tablet Take 300 mg by mouth daily.   2  . atorvastatin (LIPITOR) 10 MG tablet  TAKE ONE TABLET BY MOUTH ONCE DAILY 30 tablet 10  . atorvastatin (LIPITOR) 20 MG tablet TAKE ONE TABLET EVERY OTHER DAY 30 tablet 0  . Coenzyme Q10 (CO Q10) 100 MG CAPS Take 100 mg by mouth daily.    Marland Kitchen doxazosin (CARDURA) 1 MG tablet Take 1 mg by mouth at bedtime.    Marland Kitchen doxazosin (CARDURA) 2 MG tablet Take 2 mg by mouth daily. In the morning.    . empagliflozin (JARDIANCE) 10 MG TABS tablet Take by mouth.    . Evolocumab (REPATHA SURECLICK) 782 MG/ML SOAJ Inject 1 pen into the skin every 14 (fourteen) days. 2 pen 11  . ezetimibe (ZETIA) 10 MG tablet Take 10 mg by mouth at bedtime.     . finasteride (PROSCAR) 5 MG tablet Take 1 tablet (5 mg total) by mouth at bedtime. 90 tablet 3  . furosemide (LASIX) 20 MG tablet Take 2 tablets (40 mg total) by mouth daily. 60 tablet 3  . Glucosamine HCl-MSM (GLUCOSAMINE-MSM PO) Take 1 tablet by mouth daily.    Marland Kitchen latanoprost (XALATAN) 0.005 % ophthalmic solution Instill 1 drop into the affected eye at bedtime    . Multiple Vitamins-Minerals (OCUVITE EXTRA PO) Take 1 tablet by mouth daily.    . ramipril (ALTACE) 2.5 MG capsule Take 2.5 mg by mouth every evening.    . ramipril (ALTACE) 5 MG capsule TAKE ONE CAPSULE EACH DAY 90 capsule 3  . spironolactone (ALDACTONE) 25 MG tablet Take 1 tablet (25 mg total) by mouth at bedtime. 90 tablet   . tamsulosin (FLOMAX) 0.4 MG CAPS capsule Take 0.4 mg by mouth at bedtime.     . timolol (TIMOPTIC) 0.5 % ophthalmic solution Place 1 drop into the right eye daily.   5  . warfarin (COUMADIN) 3 MG tablet TAKE 2-3 TABLETS DAILY AS DIRECTED BY COUMADIN CLINIC 80 tablet 1   No current facility-administered medications for this encounter.     Allergies  Allergen Reactions  . Grass Extracts [Gramineae Pollens] Other (See Comments)    Sneezing and runny nose  . Milk-Related Compounds     Just Yogurt.  Makes him very lethargic.    Social History   Socioeconomic History  . Marital status: Widowed    Spouse name: Not on file   . Number of children: Not on file  . Years of education: Not on file  . Highest education level: Not on file  Occupational History  . 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 Financial resource strain: Not on file  . Food insecurity    Worry: Not on file    Inability: Not on file  . Transportation needs    Medical: Not on file    Non-medical: Not on file  Tobacco Use  . Smoking status: Former Smoker    Packs/day: 0.25    Years: 20.00    Pack years: 5.00    Types: Cigarettes    Quit date: 03/23/1992  Years since quitting: 27.2  . Smokeless tobacco: Never Used  Substance and Sexual Activity  . Alcohol use: Yes    Alcohol/week: 10.0 standard drinks    Types: 10 Glasses of wine per week    Comment: 1-2 drinks daily   . Drug use: No  . Sexual activity: Not on file  Lifestyle  . Physical activity    Days per week: Not on file    Minutes per session: Not on file  . Stress: Not on file  Relationships  . Social Herbalist on phone: Not on file    Gets together: Not on file    Attends religious service: Not on file    Active member of club or organization: Not on file    Attends meetings of clubs or organizations: Not on file    Relationship status: Not on file  . Intimate partner violence    Fear of current or ex partner: Not on file    Emotionally abused: Not on file    Physically abused: Not on file    Forced sexual activity: Not on file  Other Topics Concern  . Not on file  Social History Narrative   1 caffeine drink daily     Family History  Problem Relation Age of Onset  . Heart disease Mother   . Heart disease Father        before age 55  . AAA (abdominal aortic aneurysm) Father   . Colon cancer Neg Hx     ROS- All systems are reviewed and negative except as per the HPI above  Physical Exam: There were no vitals filed for this visit. Wt Readings from Last 3 Encounters:  06/07/19 87.9 kg  05/25/19 89.4 kg  05/23/19 90.9 kg    Labs:  Lab Results  Component Value Date   NA 138 06/07/2019   K 4.1 06/07/2019   CL 106 06/07/2019   CO2 21 (L) 06/07/2019   GLUCOSE 159 (H) 06/07/2019   BUN 15 06/07/2019   CREATININE 1.18 06/07/2019   CALCIUM 9.2 06/07/2019   PHOS 3.2 12/22/2017   MG 2.1 12/22/2017   Lab Results  Component Value Date   INR 2.3 (H) 06/07/2019   Lab Results  Component Value Date   CHOL 96 (L) 11/09/2018   HDL 56 11/09/2018   LDLCALC 14 11/09/2018   TRIG 128 11/09/2018     GEN- The patient is well appearing, alert and oriented x 3 today.   Head- normocephalic, atraumatic Eyes-  Sclera clear, conjunctiva pink Ears- hearing intact Oropharynx- clear Neck- supple, no JVP Lymph- no cervical lymphadenopathy Lungs- Clear to ausculation bilaterally, normal work of breathing Heart- Rapid irregular rate and rhythm, no murmurs, rubs or gallops, PMI not laterally displaced GI- soft, NT, ND, + BS Extremities- no clubbing, cyanosis, or edema MS- no significant deformity or atrophy Skin- no rash or lesion Psych- euthymic mood, full affect Neuro- strength and sensation are intact  EKG-afib with rvr at 121 bpm, qrs 112 ms, qtc 482 ms,( qtc in SR with ILBBB run around 450 ms)    Assessment and Plan: 1. Paroxysmal afib with recent DCCV with ERAF with RVR Pt cannot take BB's and amiodarone for intolerance, hallucinations and suicidal ideations (remote use in 2015) Dr. Aundra Dubin fears, with his h/o of heart failure, he will decompensate quickly  General education re admit for Tikosyn and what to expect He will check on the price of the drug Drugs to be screened  by pharmD Crcl cal at 56.67, (creatiinine at 1.18) currently qualifying for 250 mcg dofetilide bid Kt today at 4.1 and mag is pending  He is on warfarin,TEE on 6/11 without any evidence of left atrial thrombus,  INR  that day was therapeutic at 4.0 and today at 2.3, mag is pending  I discussed the above with Dr. Lovena Le, he is ok with recent TEE and  INR in range today, without 4 weeks of therapeutic INR's  He is concerned that he may run into issues with prolonged qt and ability to remain on drug but he is willing to try  No benadryl use   He will return on Monday to afib clinic  for admit, with bmet/mag and INR  Butch Penny C. Burton Gahan, [facility] [address] [place], [place] [address] [phone]

## 2019-06-07 NOTE — Patient Instructions (Signed)
Lab work done today. We will notify you of any abnormal lab work. No news is good news.  Lab work will need to be done again the day of your procedure.  You will need a covid test a [facility] 3 days prior to your procedure. This will be on   START Amiodarone. 400mg  two times a day for 7 days only. Then start Amiodarone 200mg  two times a day for 7 days only. Then start Amiodarone 200mg  daily and stay at that dose until your follow up appointment.  Your physician has recommended that you have a Cardioversion (DCCV). Electrical Cardioversion uses a jolt of electricity to your heart either through paddles or wired patches attached to your chest. This is a controlled, usually prescheduled, procedure. Defibrillation is done under light anesthesia in the hospital, and you usually go home the day of the procedure. This is done to get your heart back into a normal rhythm. You are not awake for the procedure. Please see the instruction sheet given to you today.

## 2019-06-08 ENCOUNTER — Other Ambulatory Visit (HOSPITAL_COMMUNITY)
Admission: RE | Admit: 2019-06-08 | Discharge: 2019-06-08 | Disposition: A | Discharge status: Home / Self Care | Payer: Medicare Other | Source: Ambulatory Visit | Attending: Internal Medicine | Admitting: Internal Medicine

## 2019-06-08 DIAGNOSIS — Z1159 Encounter for screening for other viral diseases: Secondary | ICD-10-CM | POA: Insufficient documentation

## 2019-06-08 LAB — SARS CORONAVIRUS 2 (TAT 6-24 HRS): SARS Coronavirus 2: NEGATIVE

## 2019-06-08 NOTE — Progress Notes (Signed)
Patient ID: Nathaniel Chio., male   DOB: 1932/02/17, 85 y.o.   MRN: [id number] PCP: Dr. Elyse Hsu Cardiology: Dr. Aundra Dubin  85 y.o. with history of extensive vascular disease including CAD s/p CABG and redo CABG, AAA s/p repair, PAD, and carotid stenosis as well as prior AVR presents for followup of CAD and atrial fibrillation.   He has had documentation of significant peripheral arterial disease.  He gets bilateral calf soreness after walking for about 15 minutes.  This resolves with rest, no rest pain.  No pedal ulcers, though when he gets cuts on his feet, they heal slowly.  Peripheral arterial dopplers done in 10/14 showed > 50% focal SFA stenoses bilaterally.  He has carotid artery disease.  No stroke-like symptoms.  He had a endovascular AAA repair in 5/15.    After his AAA repair, he was noted to be in atrial fibrillation.  He had had a prior episode of atrial fibrillation after his cardiac surgery in 2007.  Atrial fibrillation was rate-controlled.  He was started on warfarin and Toprol XL.  He developed exertional dyspnea and fatigue in atrial fibrillation. I cardioverted him back to NSR on 06/06/14.  I also had him get an echocardiogram in 6/15.  This showed EF worsened to 25-30% with stable bioprosthetic aortic valve, mildly dilated RV with normal systolic function.  Given the fall in EF, I took him for [facility] in 7/15.  This showed stable anatomy.  The LIMA-LAD was patent, the sequential SVG-OM/ramus/D was patent only to ramus but this is the same as the prior cath, and the SVG-PDA was patent with a long 50-60% mid-graft stenosis.  No intervention.  Of note, at cath he was noted to be back in atrial fibrillation.  I then started him on amiodarone.  I took Mr Cassarino for [place] again on 08/21/14.  This was successful.  He has been off amiodarone due to side effects (hallucinations).  Echo in 1/16 showed recovery of EF to 55-60%.    He was admitted in 1/19 with bronchitis/wheezing/dyspnea and was found to be in  atrial fibrillation with RVR.  He tested positive for metapneumovirus.  Echo showed EF 45-50% with normal bioprosthetic aortic valve. He was diuresed for volume overload while in the hospital. He had DCCV to NSR.    Carotid dopplers in 1/19 showed 80-99% RICA stenosis, he had right CEA in 5/05 with no complications.   At appointment in early 6/20, Mr Mcclintock was noted to be back in atrial fibrillation with RVR and mild CHF.  He was started on Toprol XL and Lasix was increased.  He had TEE-guided DCCV to NSR in 6/20.  TEE showed EF 50%, mild diffuse hypokinesis, mildly decreased RV systolic function, normally functioning bioprosthetic aortic valve.   Initially after cardioversion, he felt like his heart was staying in rhythm.  However, he had a stressful week last week, and thinks that his pulse became irregular again around mid-week.  Today, he is in atrial fibrillation with mild RVR (HR 100s-120).  Weight is down 7 lbs on higher Lasix dose.  No chest pain.  No significant exertional dyspnea.  He says that Toprol XL caused significant fatigue and lethargy, so he stopped it.   ECG: atrial fibrillation at 121, inferolateral TWIs, QTc 482 (personally reviewed).   Labs (5/15): K 4.3, creatinine 0.89 Labs (6/15): K 4.3, creatinine 1.4, BNP 112 Labs (7/15): K 4.4, creatinine 1.2, HCT 40.2 Labs (8/15): K 4, creatinine 1.2, BNP 113, LFTs and TSH normal  Labs (10/15): K 4.4, creatinine 1.4 Labs (5/16): K 4.7, creatinine 1.18, HDL 52, LDL 98 Labs (6/16): LDL 118, LFTs normal Labs (1/17): HCT 40.2, K 3.7, creatinine 1.04 Labs (1/19): K 4.9, creatinine 1.19 Labs (2/19): K 4.2, creatinine 0.89 Labs (3/19): LDL 86, HDL 45, TGs 245 Labs (5/19): LDL 110, HDL 39 Labs (6/19): K 4.1, creatinine 1.07 Labs (11/19): LDL 14, HDL 56 Labs (6/20): K 4.1, creatinine 1.0  PMH: 1. PAD: Peripheral arterial dopplers in 2012 showed > 75% bilateral SFA stenosis. Peripheral arterial dopplers in 10/14 showed > 50% focal  bilateral SFA stenoses.  2. AAA: [place] 4/15 with 4.4 cm AAA but concern for penetrating ulcers. CTA abdomen showed 4.4 cm AAA with penetrating ulcers and possible pseudoaneurysms. Now s/p endovascular repair of AAA in 3/61 with no complications.  3. Carotid stenosis: Carotid dopplers (4/15) with 60-79% bilateral ICA stenosis. Carotid dopplers (11/15) with 60-79% bilateral ICA stenosis.  Carotid dopplers 6/16 with 60-79% bilateral ICA stenosis. - Carotid dopplers (1/18): 60-79% RICA stenosis.  - Carotid dopplers (1/19): 80-99% RICA stenosis => Right CEA in 2/19.  - Carotid dopplers (11/19): 40-59% bilateral stenosis.  4. CAD: CABG 1989 with LIMA-LAD, SVG-D, seq SVG-ramus and OM, sequential SVG-PDA/PLV. SVG-PDA/PLV found to be occluded on cath prior to AVR, so patient had SVG-PDA with AVR in 2007. LHC (7/15) with LIMA-LAD patent with 40-50% stenosis in LAD after touchdown, sequential SVG-ramus/OM/diagonal with only the ramus branch still intact (known from prior cath), SVG-PDA from original surgery TO, SVG-PDA from redo surgery with long 50-60% mid-graft stenosis.  No target for intervention.  5. Severe aortic stenosis: Bioprosthetic AVR in 2007.  6. Atrial fibrillation: Paroxysmal. Initially noted after cardiac surgery, then again after AAA repair.  DCCV to NSR 06/06/14.  DCCV to NSR again 08/21/14.  - DCCV to NSR in 1/19.  - DCCV to NSR in 6/20.  7. Type II diabetes  8. Gout  9. GERD  10. Hyperlipidemia  11. HTN 12. GERD  13. Chronic systolic CHF: Echo (4/43) with EF worsened to 25-30% with grade II diastolic dysfunction, stable bioprosthetic aortic valve, mildly dilated RV with normal systolic function.  Echo (1/16) with EF 55-60%, basal inferior hypokinesis, mild LVH, bioprosthetic aortic valve with mean gradient 13 mmHg, mild MR, severe LAE.  - Echo (1/19): EF 45-50%, mild MR, normal bioprosthetic aortic valve.  - TEE (6/20): EF 50% with mild diffuse hypokinesis, mildly decreased RV systolic function  with mild RV enlargement, bioprosthetic aortic valve looked ok, mild MR.  14. BPH  15. Lung nodules: Followed by pulmonary.   SH: Widower, 2 children, raised his 3 grand-daughters, lives in [place], [place].  Occasional ETOH, no smoking.   FH: Father died from ruptured AAA.   ROS: All systems reviewed and negative except as per HPI.   Current Outpatient Medications  Medication Sig Dispense Refill  . allopurinol (ZYLOPRIM) 300 MG tablet Take 300 mg by mouth daily.   2  . atorvastatin (LIPITOR) 10 MG tablet TAKE ONE TABLET BY MOUTH ONCE DAILY (Patient taking differently: Take 10 mg by mouth every other day. ) 30 tablet 10  . atorvastatin (LIPITOR) 20 MG tablet TAKE ONE TABLET EVERY OTHER DAY (Patient taking differently: Take 20 mg by mouth at bedtime. ) 30 tablet 0  . Coenzyme Q10 (CO Q10) 100 MG CAPS Take 100 mg by mouth daily.    Marland Kitchen doxazosin (CARDURA) 1 MG tablet Take 1 mg by mouth at bedtime.    Marland Kitchen doxazosin (CARDURA) 2 MG  tablet Take 2 mg by mouth daily. In the morning.    . empagliflozin (JARDIANCE) 10 MG TABS tablet Take 10 mg by mouth at bedtime.     . Evolocumab (REPATHA SURECLICK) 308 MG/ML SOAJ Inject 1 pen into the skin every 14 (fourteen) days. 2 pen 11  . ezetimibe (ZETIA) 10 MG tablet Take 10 mg by mouth at bedtime.     . finasteride (PROSCAR) 5 MG tablet Take 1 tablet (5 mg total) by mouth at bedtime. 90 tablet 3  . furosemide (LASIX) 20 MG tablet Take 2 tablets (40 mg total) by mouth daily. (Patient taking differently: Take 20-40 mg by mouth See admin instructions. Alternate 20 mg one day and 40 mg the next) 60 tablet 3  . Glucosamine HCl-MSM (GLUCOSAMINE-MSM PO) Take 1 tablet by mouth daily.    Marland Kitchen latanoprost (XALATAN) 0.005 % ophthalmic solution Place 1 drop into both eyes at bedtime.     . Multiple Vitamins-Minerals (OCUVITE EXTRA PO) Take 1 tablet by mouth daily.    . ramipril (ALTACE) 2.5 MG capsule Take 2.5 mg by mouth at bedtime.     . ramipril (ALTACE) 5 MG  capsule TAKE ONE CAPSULE EACH DAY (Patient taking differently: Take 5 mg by mouth daily. ) 90 capsule 3  . spironolactone (ALDACTONE) 25 MG tablet Take 1 tablet (25 mg total) by mouth at bedtime. 90 tablet   . tamsulosin (FLOMAX) 0.4 MG CAPS capsule Take 0.4 mg by mouth See admin instructions. Take 0.4 mg in the morning may take a second 0.4 mg at night as needed for urinary retention    . timolol (TIMOPTIC) 0.5 % ophthalmic solution Place 1 drop into both eyes daily.   5  . warfarin (COUMADIN) 3 MG tablet TAKE 2-3 TABLETS DAILY AS DIRECTED BY COUMADIN CLINIC (Patient taking differently: Take 6-9 mg by mouth See admin instructions. Take 6 mg on Mon and Fri.  Take 9 mg on Tue, Wed, Thu, Sat, and Sun) 80 tablet 1  . loratadine-pseudoephedrine ([name] 24-HOUR) 10-240 MG 24 hr tablet Take 1 tablet by mouth daily as needed for allergies.    Marland Kitchen neomycin-bacitracin-polymyxin (NEOSPORIN) ointment Apply 1 application topically as needed for wound care.     No current facility-administered medications for this encounter.     BP 140/82   Pulse 98   Wt 87.9 kg (193 lb 12.8 oz)   SpO2 96%   BMI 27.81 kg/m   General: NAD Neck: JVP 8 cm, no thyromegaly or thyroid nodule.  Lungs: Clear to auscultation bilaterally with normal respiratory effort. CV: Nondisplaced PMI.  Heart irregular S1/S2, no S3/S4, no murmur.  1+ right ankle edema, trace left ankle edema.  No carotid bruit.  Normal pedal pulses.  Abdomen: Soft, nontender, no hepatosplenomegaly, no distention.  Skin: Intact without lesions or rashes.  Neurologic: Alert and oriented x 3.  Psych: Normal affect. Extremities: No clubbing or cyanosis.  HEENT: Normal.   Assessment/Plan:  1. CAD: s/p CABG and redo CABG.  Given fall in EF, I did a cardiac cath in 7/15 as documented above. Unfortunately, despite significant coronary disease, there were no good interventional targets.  No recent chest pain.  - Continue atorvastatin, Zetia, and Repatha.  - He  is on warfarin in setting of stable CAD so does not need ASA 81.   2. Hyperlipidemia: He is taking atorvastatin + Zetia and Repatha.  Last LDL was excellent in 11/19.   - Continue current atorvastatin, Repatha, and Zetia. 3. PAD: Patient  denies claudication or foot ulcers. Peripheral arterial dopplers showed > 50% bilateral focal SFA stenoses in the past.  He is not a cilostazol candidate with CHF. He is followed by Dr. Trula Slade at [facility].  4. Carotid stenosis: S/p right CEA in 2/19.  Followed by Dr. Trula Slade at [facility].  5. AAA: Successful endovascular repair in 5/15.  6. Bioprosthetic AVR: Valve stable on 6/20 TEE.  7. Atrial fibrillation: He is back in atrial fibrillation with RVR after initially successful DCCV in 6/20.  He has been off amiodarone given possible CNS side effects.  He has not tolerated beta blockers due to profound fatigue.  Given worsening of CHF with atrial fibrillation and difficulty with rate control, I think that we need to maintain NSR.  Given rapid return of atrial fibrillation after last DCCV, I think he will need an antiarrhythmic.  Tikosyn is probably his only option at this point.  - He has not tolerated beta blockers due to fatigue.  - I will have him seen in atrial fibrillation clinic today to get him set up for Tikosyn admission.  - Continue warfarin and get INR today.   8. Chronic primarily diastolic CHF: Suspect ischemic cardiomyopathy.  EF 25-30% in 6/15 but had normalized to 55-60% by 1/16.  Down to 45-50% on 1/19 echo but was in the setting of atrial fibrillation.  TEE in 6/20 showed EF 50%.  Very mild volume overload by exam today, NYHA class II symptoms.  - Continue Lasix at 40 mg daily.   - Needs conversion to NSR as above.  - Continue ramipril 5 qam/2.5 qpm.  - Continue spironolactone 25 mg daily.  9. HTN: BP generally controlled.    10. Diabetes: He is on Jardiance.    Followup after Tikosyn admission.   [name] 06/08/2019

## 2019-06-09 ENCOUNTER — Other Ambulatory Visit: Payer: Self-pay | Admitting: Pharmacist

## 2019-06-09 MED ORDER — REPATHA SURECLICK 140 MG/ML ~~LOC~~ SOAJ: 1.0000 "pen " | SUBCUTANEOUS | 11 refills | Status: DC

## 2019-06-09 NOTE — Telephone Encounter (Signed)
PA for Repatha approved through 06/06/2020 Rx sent to [facility]

## 2019-06-12 ENCOUNTER — Other Ambulatory Visit: Payer: Self-pay

## 2019-06-12 ENCOUNTER — Encounter (HOSPITAL_COMMUNITY): Payer: Self-pay | Admitting: Nurse Practitioner

## 2019-06-12 ENCOUNTER — Encounter (HOSPITAL_COMMUNITY): Payer: Self-pay | Admitting: *Deleted

## 2019-06-12 ENCOUNTER — Inpatient Hospital Stay (HOSPITAL_COMMUNITY)
Admission: RE | Admit: 2019-06-12 | Discharge: 2019-06-15 | DRG: 309 | Disposition: A | Discharge status: Home / Self Care | Payer: Medicare Other | Source: Ambulatory Visit | Attending: Internal Medicine | Admitting: Internal Medicine

## 2019-06-12 ENCOUNTER — Ambulatory Visit (HOSPITAL_COMMUNITY)
Admission: RE | Admit: 2019-06-12 | Discharge: 2019-06-12 | Disposition: A | Discharge status: Home / Self Care | Payer: Medicare Other | Source: Ambulatory Visit | Attending: Nurse Practitioner | Admitting: Nurse Practitioner

## 2019-06-12 DIAGNOSIS — I130 Hypertensive heart and chronic kidney disease with heart failure and stage 1 through stage 4 chronic kidney disease, or unspecified chronic kidney disease: Secondary | ICD-10-CM | POA: Diagnosis not present

## 2019-06-12 DIAGNOSIS — Z888 Allergy status to other drugs, medicaments and biological substances status: Secondary | ICD-10-CM

## 2019-06-12 DIAGNOSIS — K219 Gastro-esophageal reflux disease without esophagitis: Secondary | ICD-10-CM | POA: Diagnosis present

## 2019-06-12 DIAGNOSIS — Z87891 Personal history of nicotine dependence: Secondary | ICD-10-CM | POA: Diagnosis not present

## 2019-06-12 DIAGNOSIS — I480 Paroxysmal atrial fibrillation: Secondary | ICD-10-CM | POA: Diagnosis not present

## 2019-06-12 DIAGNOSIS — E785 Hyperlipidemia, unspecified: Secondary | ICD-10-CM | POA: Diagnosis present

## 2019-06-12 DIAGNOSIS — Z7901 Long term (current) use of anticoagulants: Secondary | ICD-10-CM | POA: Diagnosis not present

## 2019-06-12 DIAGNOSIS — Z951 Presence of aortocoronary bypass graft: Secondary | ICD-10-CM | POA: Diagnosis not present

## 2019-06-12 DIAGNOSIS — E1122 Type 2 diabetes mellitus with diabetic chronic kidney disease: Secondary | ICD-10-CM | POA: Diagnosis present

## 2019-06-12 DIAGNOSIS — Z8719 Personal history of other diseases of the digestive system: Secondary | ICD-10-CM | POA: Diagnosis not present

## 2019-06-12 DIAGNOSIS — Z91011 Allergy to milk products: Secondary | ICD-10-CM | POA: Diagnosis not present

## 2019-06-12 DIAGNOSIS — Z7984 Long term (current) use of oral hypoglycemic drugs: Secondary | ICD-10-CM | POA: Diagnosis not present

## 2019-06-12 DIAGNOSIS — Z79899 Other long term (current) drug therapy: Secondary | ICD-10-CM | POA: Diagnosis not present

## 2019-06-12 DIAGNOSIS — I5042 Chronic combined systolic (congestive) and diastolic (congestive) heart failure: Secondary | ICD-10-CM | POA: Diagnosis not present

## 2019-06-12 DIAGNOSIS — I2510 Atherosclerotic heart disease of native coronary artery without angina pectoris: Secondary | ICD-10-CM | POA: Diagnosis not present

## 2019-06-12 DIAGNOSIS — I4819 Other persistent atrial fibrillation: Secondary | ICD-10-CM

## 2019-06-12 DIAGNOSIS — Z953 Presence of xenogenic heart valve: Secondary | ICD-10-CM

## 2019-06-12 DIAGNOSIS — Z95828 Presence of other vascular implants and grafts: Secondary | ICD-10-CM | POA: Diagnosis not present

## 2019-06-12 DIAGNOSIS — Z8249 Family history of ischemic heart disease and other diseases of the circulatory system: Secondary | ICD-10-CM | POA: Diagnosis not present

## 2019-06-12 DIAGNOSIS — N189 Chronic kidney disease, unspecified: Secondary | ICD-10-CM | POA: Diagnosis present

## 2019-06-12 DIAGNOSIS — E1151 Type 2 diabetes mellitus with diabetic peripheral angiopathy without gangrene: Secondary | ICD-10-CM | POA: Diagnosis not present

## 2019-06-12 DIAGNOSIS — I4891 Unspecified atrial fibrillation: Secondary | ICD-10-CM | POA: Diagnosis present

## 2019-06-12 LAB — BASIC METABOLIC PANEL
Anion gap: 10 (ref 5–15)
BUN: 19 mg/dL (ref 8–23)
CO2: 25 mmol/L (ref 22–32)
Calcium: 8.9 mg/dL (ref 8.9–10.3)
Chloride: 105 mmol/L (ref 98–111)
Creatinine, Ser: 1.19 mg/dL (ref 0.61–1.24)
GFR calc Af Amer: >60 mL/min (ref >60)
GFR calc non Af Amer: 55 mL/min — ABNORMAL LOW (ref >60)
Glucose, Bld: 114 mg/dL — ABNORMAL HIGH (ref 70–99)
Potassium: 4.2 mmol/L (ref 3.5–5.1)
Sodium: 140 mmol/L (ref 135–145)

## 2019-06-12 LAB — PROTIME-INR
INR: 2.6 — ABNORMAL HIGH (ref 0.8–1.2)
Prothrombin Time: 27 seconds — ABNORMAL HIGH (ref 11.4–15.2)

## 2019-06-12 LAB — MAGNESIUM: Magnesium: 2 mg/dL (ref 1.7–2.4)

## 2019-06-12 MED ORDER — EZETIMIBE 10 MG PO TABS
10.0000 mg | ORAL_TABLET | Freq: Every day | ORAL | Status: DC
  Administered 2019-06-12 – 2019-06-14 (×3): 10 mg via ORAL
  Filled 2019-06-12 (×3): qty 1

## 2019-06-12 MED ORDER — WARFARIN - PHARMACIST DOSING INPATIENT: Freq: Every day | Status: DC

## 2019-06-12 MED ORDER — FUROSEMIDE 20 MG PO TABS
20.0000 mg | ORAL_TABLET | Freq: Every day | ORAL | Status: DC
  Administered 2019-06-13 – 2019-06-15 (×3): 20 mg via ORAL
  Filled 2019-06-12 (×3): qty 1

## 2019-06-12 MED ORDER — SODIUM CHLORIDE 0.9% FLUSH: 3.0000 mL | INTRAVENOUS | Status: DC | PRN

## 2019-06-12 MED ORDER — WARFARIN SODIUM 5 MG PO TABS
9.0000 mg | ORAL_TABLET | ORAL | Status: DC
  Administered 2019-06-13: 9 mg via ORAL
  Filled 2019-06-12: qty 1

## 2019-06-12 MED ORDER — DOXAZOSIN MESYLATE 2 MG PO TABS: 2.0000 mg | ORAL_TABLET | Freq: Every day | ORAL | Status: DC

## 2019-06-12 MED ORDER — DOXAZOSIN MESYLATE 1 MG PO TABS
1.0000 mg | ORAL_TABLET | Freq: Every day | ORAL | Status: DC
  Administered 2019-06-12 – 2019-06-14 (×3): 1 mg via ORAL
  Filled 2019-06-12 (×4): qty 1

## 2019-06-12 MED ORDER — RAMIPRIL 2.5 MG PO CAPS
2.5000 mg | ORAL_CAPSULE | Freq: Every evening | ORAL | Status: DC
  Administered 2019-06-12 – 2019-06-14 (×3): 2.5 mg via ORAL
  Filled 2019-06-12 (×4): qty 1

## 2019-06-12 MED ORDER — DOXAZOSIN MESYLATE 2 MG PO TABS
2.0000 mg | ORAL_TABLET | Freq: Every morning | ORAL | Status: DC
  Administered 2019-06-13 – 2019-06-15 (×3): 2 mg via ORAL
  Filled 2019-06-12 (×3): qty 1

## 2019-06-12 MED ORDER — SODIUM CHLORIDE 0.9% FLUSH
3.0000 mL | Freq: Two times a day (BID) | INTRAVENOUS | Status: DC
  Administered 2019-06-13 (×2): 3 mL via INTRAVENOUS

## 2019-06-12 MED ORDER — ATORVASTATIN CALCIUM 10 MG PO TABS
20.0000 mg | ORAL_TABLET | ORAL | Status: DC
  Administered 2019-06-14: 20 mg via ORAL
  Filled 2019-06-12: qty 2

## 2019-06-12 MED ORDER — WARFARIN SODIUM 3 MG PO TABS: 6.0000 mg | ORAL_TABLET | ORAL | Status: DC

## 2019-06-12 MED ORDER — CANAGLIFLOZIN 100 MG PO TABS
100.0000 mg | ORAL_TABLET | Freq: Every day | ORAL | Status: DC
  Administered 2019-06-13 – 2019-06-15 (×3): 100 mg via ORAL
  Filled 2019-06-12 (×3): qty 1

## 2019-06-12 MED ORDER — FINASTERIDE 5 MG PO TABS
5.0000 mg | ORAL_TABLET | Freq: Every day | ORAL | Status: DC
  Administered 2019-06-12 – 2019-06-14 (×3): 5 mg via ORAL
  Filled 2019-06-12 (×3): qty 1

## 2019-06-12 MED ORDER — ATORVASTATIN CALCIUM 10 MG PO TABS: 10.0000 mg | ORAL_TABLET | Freq: Every day | ORAL | Status: DC

## 2019-06-12 MED ORDER — OCUVITE-LUTEIN PO CAPS
1.0000 | ORAL_CAPSULE | Freq: Every day | ORAL | Status: DC
  Filled 2019-06-12 (×2): qty 1

## 2019-06-12 MED ORDER — ALLOPURINOL 300 MG PO TABS
300.0000 mg | ORAL_TABLET | Freq: Every day | ORAL | Status: DC
  Administered 2019-06-13 – 2019-06-15 (×3): 300 mg via ORAL
  Filled 2019-06-12 (×3): qty 1

## 2019-06-12 MED ORDER — WARFARIN SODIUM 5 MG PO TABS
6.0000 mg | ORAL_TABLET | ORAL | Status: DC
  Administered 2019-06-12: 6 mg via ORAL
  Filled 2019-06-12: qty 1

## 2019-06-12 MED ORDER — DOFETILIDE 250 MCG PO CAPS
250.0000 ug | ORAL_CAPSULE | Freq: Two times a day (BID) | ORAL | Status: DC
  Administered 2019-06-12 – 2019-06-15 (×6): 250 ug via ORAL
  Filled 2019-06-12 (×6): qty 1

## 2019-06-12 MED ORDER — CO Q10 100 MG PO CAPS: 100.0000 mg | ORAL_CAPSULE | Freq: Every day | ORAL | Status: DC

## 2019-06-12 MED ORDER — OCUVITE EXTRA PO TABS: ORAL_TABLET | Freq: Every day | ORAL | Status: DC

## 2019-06-12 MED ORDER — LATANOPROST 0.005 % OP SOLN
1.0000 [drp] | Freq: Every day | OPHTHALMIC | Status: DC
  Administered 2019-06-13 – 2019-06-14 (×3): 1 [drp] via OPHTHALMIC
  Filled 2019-06-12 (×2): qty 2.5

## 2019-06-12 MED ORDER — RAMIPRIL 5 MG PO CAPS
5.0000 mg | ORAL_CAPSULE | Freq: Every morning | ORAL | Status: DC
  Administered 2019-06-13 – 2019-06-15 (×3): 5 mg via ORAL
  Filled 2019-06-12 (×3): qty 1

## 2019-06-12 MED ORDER — SPIRONOLACTONE 25 MG PO TABS
25.0000 mg | ORAL_TABLET | Freq: Every day | ORAL | Status: DC
  Administered 2019-06-12 – 2019-06-14 (×3): 25 mg via ORAL
  Filled 2019-06-12 (×3): qty 1

## 2019-06-12 MED ORDER — TAMSULOSIN HCL 0.4 MG PO CAPS
0.4000 mg | ORAL_CAPSULE | Freq: Every day | ORAL | Status: DC
  Administered 2019-06-13 – 2019-06-15 (×2): 0.4 mg via ORAL
  Filled 2019-06-12 (×2): qty 1

## 2019-06-12 MED ORDER — RAMIPRIL 5 MG PO CAPS: 5.0000 mg | ORAL_CAPSULE | Freq: Every day | ORAL | Status: DC

## 2019-06-12 MED ORDER — TIMOLOL MALEATE 0.5 % OP SOLN
1.0000 [drp] | Freq: Every day | OPHTHALMIC | Status: DC
  Administered 2019-06-13 – 2019-06-15 (×2): 1 [drp] via OPHTHALMIC
  Filled 2019-06-12: qty 5

## 2019-06-12 MED ORDER — ATORVASTATIN CALCIUM 10 MG PO TABS
30.0000 mg | ORAL_TABLET | ORAL | Status: DC
  Administered 2019-06-13: 30 mg via ORAL
  Filled 2019-06-12: qty 3

## 2019-06-12 MED ORDER — ATORVASTATIN CALCIUM 10 MG PO TABS: 20.0000 mg | ORAL_TABLET | Freq: Every day | ORAL | Status: DC

## 2019-06-12 NOTE — Progress Notes (Signed)
PHARMACIST - PHYSICIAN ORDER COMMUNICATION  CONCERNING: P&T Medication Policy on Herbal Medications  DESCRIPTION:  This patient's order for:  Co Q 10  has been noted.  This product(s) is classified as an "herbal" or natural product. Due to a lack of definitive safety studies or FDA approval, nonstandard manufacturing practices, plus the potential risk of unknown drug-drug interactions while on inpatient medications, the Pharmacy and Therapeutics Committee does not permit the use of "herbal" or natural products of this type within [facility].   ACTION TAKEN: The pharmacy department is unable to verify this order at this time and your patient has been informed of this safety policy. Please reevaluate patient's clinical condition at discharge and address if the herbal or natural product(s) should be resumed at that time.   Curtistine Pettitt A. Levada Dy, PharmD, [place] Please utilize Amion for appropriate phone number to reach the unit pharmacist ([place])

## 2019-06-12 NOTE — Progress Notes (Signed)
ANTICOAGULATION CONSULT NOTE - Initial Consult  Pharmacy Consult for Warfarin Indication: atrial fibrillation  Allergies  Allergen Reactions  . Grass Extracts [Gramineae Pollens] Other (See Comments)    Sneezing and runny nose  . Milk-Related Compounds     Just Yogurt.  Makes him very lethargic.  Marland Kitchen Amiodarone Anxiety    depression    Patient Measurements: Height: 5\' 10"  (177.8 cm) Weight: 194 lb 14.4 oz (88.4 kg) IBW/kg (Calculated) : 73  Vital Signs: Temp: 97.5 F (36.4 C) (06/29 1500) Temp Source: Oral (06/29 1500) BP: 120/84 (06/29 1500) Pulse Rate: 103 (06/29 1500)  Labs: Recent Labs    06/12/19 1242  LABPROT 27.0*  INR 2.6*  CREATININE 1.19    Estimated Creatinine Clearance: 50.8 mL/min (by C-G formula based on SCr of 1.19 mg/dL).   Medical History: Past Medical History:  Diagnosis Date  . AAA (abdominal aortic aneurysm) ([place])   . Atrial fibrillation ([place])   . Atrial fibrillation ([place])   . CAD (coronary artery disease)   . Cataract   . CHF (congestive heart failure) ([place])   . Chronic kidney disease   . Colon polyps    adenomatous  . Diabetes mellitus   . Diverticulosis   . Dysrhythmia    A-Fib  . GERD (gastroesophageal reflux disease)   . Gout   . Hernia, abdominal   . History of aortic valve replacement with bioprosthetic valve    2007  . Hx: UTI (urinary tract infection)   . Hyperlipemia   . Hypertension   . Internal hemorrhoids   . Peripheral vascular disease ([place])   . Shortness of breath    with exertion   Assessment: CC/HPI: Tikosyn admission  PMH: TEE/CV 6/9.extensive vascular disease including CAD s/p CABG 2007 and redo CABG, AAA s/p repair 5/15, PAD, and carotid stenosis as well as prior AVR 2007 bioprosthetic, , cataracts, CHF, CKD, DM, diverticulosis, GERD, gout, abdominal hernia, HTN, HLD, hemorrhoids,   Significant events: has not tolerated BB's  for extreme fatigue or amiodarone in the remote past for hallucinations and  suicidal ideations.  Anticoag: Warfarin PTA for afib. TEE on 6/11 without any evidence of left atrial thrombus, INR  that day was therapeutic at 4.0, 6/24 at 2.3, and today at 2.6 - PTA warfarin: 6mg  M/F and 9mg  all other days.  Goal of Therapy:  INR 2-3 Monitor platelets by anticoagulation protocol: Yes   Plan:   Tikosyn 250 BID Continue Coumadin 6mg  M/F and 9mg  all other days. Daily INR  Karita Dralle S. Alford Highland, PharmD, BCPS Clinical Staff Pharmacist Eilene Ghazi Stillinger 06/12/2019,4:11 PM

## 2019-06-12 NOTE — H&P (Signed)
Primary Care Physician: Altheimer, [name], MD Referring Physician: Dr. Zachery Conch Elward Nocera. is a 85 y.o. male with extensive vascular disease including CAD s/p CABG and redo CABG, AAA s/p repair, PAD, carotid stenosis as  prior AVR , as well as h/o paroxysmal afib, that is in the afib clinic for consideration for Tikosyn admit. He was seen by Dr. Aundra Dubin last week  and found to have ERAF after successful TEE/DCCV  6/11. Pt does  have RVR and has not tolerated BB's  for extreme fatigue or amiodarone in the remote past for suicidal ideations. Dr. 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   Today, he denies symptoms of palpitations, chest pain, shortness of breath, orthopnea, PND, lower extremity edema, dizziness, presyncope, syncope, or neurologic sequela. The patient is tolerating medications without difficulties and is otherwise without complaint today.       Past Medical History:  Diagnosis Date  . AAA (abdominal aortic aneurysm) ([place])   . Atrial fibrillation ([place])   . Atrial fibrillation ([place])   . CAD (coronary artery disease)   . Cataract   . CHF (congestive heart failure) ([place])   . Chronic kidney disease   . Colon polyps    adenomatous  . Diabetes mellitus   . Diverticulosis   . Dysrhythmia    A-Fib  . GERD (gastroesophageal reflux disease)   . Gout   . Hernia, abdominal   . History of aortic valve replacement with bioprosthetic valve    2007  . Hx: UTI (urinary tract infection)   . Hyperlipemia   . Hypertension   . Internal hemorrhoids   . Peripheral vascular disease ([place])   . Shortness of breath    with exertion        Past Surgical History:  Procedure Laterality Date  . ABDOMINAL AORTIC ENDOVASCULAR STENT GRAFT N/A 04/13/2014   Procedure: ABDOMINAL AORTIC ENDOVASCULAR STENT GRAFT;   Surgeon: Serafina Mitchell, MD;  Location: [facility];  Service: Vascular;  Laterality: N/A;  . ANGIOPLASTY     Unsure if he had stents put in   . APPENDECTOMY    . CARDIAC VALVE SURGERY    . CARDIOVERSION N/A 06/06/2014   Procedure: CARDIOVERSION;  Surgeon: Larey Dresser, MD;  Location[facility] ENDOSCOPY;  Service: Cardiovascular;  Laterality: N/A;  . CARDIOVERSION N/A 08/21/2014   Procedure: CARDIOVERSION;  Surgeon: Larey Dresser, MD;  Location: [facility] ENDOSCOPY;  Service: Cardiovascular;  Laterality: N/A;  . CARDIOVERSION N/A 12/22/2017   Procedure: CARDIOVERSION;  Surgeon: Larey Dresser, MD;  Location: [facility] ENDOSCOPY;  Service: Cardiovascular;  Laterality: N/A;  . CARDIOVERSION N/A 05/25/2019   Procedure: CARDIOVERSION;  Surgeon: Larey Dresser, MD;  Location: [facility] ENDOSCOPY;  Service: Cardiovascular;  Laterality: N/A;  . CORONARY ARTERY BYPASS GRAFT    . ENDARTERECTOMY Right 02/02/2018   Procedure: ENDARTERECTOMY CAROTID RIGHT;  Surgeon: Serafina Mitchell, MD;  Location: [place] [facility];  Service: Vascular;  Laterality: Right;  . EYE SURGERY Bilateral    cataract surgery  . HERNIA REPAIR     2 operations  . LEFT HEART CATHETERIZATION WITH CORONARY/GRAFT ANGIOGRAM N/A 07/10/2014   Procedure: LEFT HEART CATHETERIZATION WITH Beatrix Fetters;  Surgeon: Larey Dresser, MD;  Location: [facility] CATH LAB;  Service: Cardiovascular;  Laterality: N/A;  . TEE WITHOUT CARDIOVERSION N/A  05/25/2019   Procedure: TRANSESOPHAGEAL ECHOCARDIOGRAM (TEE);  Surgeon: Larey Dresser, MD;  Location: [facility] ENDOSCOPY;  Service: Cardiovascular;  Laterality: N/A;  . TONSILLECTOMY    . VIDEO BRONCHOSCOPY Bilateral 12/25/2015   Procedure: VIDEO BRONCHOSCOPY WITHOUT FLUORO;  Surgeon: Javier Glazier, MD;  Location: [facility];  Service: Cardiopulmonary;  Laterality: Bilateral;          Current Outpatient Medications  Medication Sig Dispense Refill  . allopurinol (ZYLOPRIM) 300 MG tablet Take 300 mg by mouth  daily.   2  . atorvastatin (LIPITOR) 10 MG tablet TAKE ONE TABLET BY MOUTH ONCE DAILY (Patient taking differently: Take 10 mg by mouth every other day. ) 30 tablet 10  . atorvastatin (LIPITOR) 20 MG tablet TAKE ONE TABLET EVERY OTHER DAY (Patient taking differently: Take 20 mg by mouth at bedtime. ) 30 tablet 0  . Coenzyme Q10 (CO Q10) 100 MG CAPS Take 100 mg by mouth daily.    Marland Kitchen doxazosin (CARDURA) 1 MG tablet Take 1 mg by mouth at bedtime.    . empagliflozin (JARDIANCE) 10 MG TABS tablet Take 10 mg by mouth at bedtime.     . Evolocumab (REPATHA SURECLICK) 976 MG/ML SOAJ Inject 1 pen into the skin every 14 (fourteen) days. 2 pen 11  . ezetimibe (ZETIA) 10 MG tablet Take 10 mg by mouth at bedtime.     . finasteride (PROSCAR) 5 MG tablet Take 1 tablet (5 mg total) by mouth at bedtime. 90 tablet 3  . furosemide (LASIX) 20 MG tablet Take 2 tablets (40 mg total) by mouth daily. (Patient taking differently: Take 20-40 mg by mouth See admin instructions. Alternate 20 mg one day and 40 mg the next) 60 tablet 3  . Glucosamine HCl-MSM (GLUCOSAMINE-MSM PO) Take 1 tablet by mouth daily.    Marland Kitchen latanoprost (XALATAN) 0.005 % ophthalmic solution Place 1 drop into both eyes at bedtime.     . Multiple Vitamins-Minerals (OCUVITE EXTRA PO) Take 1 tablet by mouth daily.    Marland Kitchen neomycin-bacitracin-polymyxin (NEOSPORIN) ointment Apply 1 application topically as needed for wound care.    . ramipril (ALTACE) 2.5 MG capsule Take 2.5 mg by mouth at bedtime.     . ramipril (ALTACE) 5 MG capsule TAKE ONE CAPSULE EACH DAY (Patient taking differently: Take 5 mg by mouth daily. ) 90 capsule 3  . spironolactone (ALDACTONE) 25 MG tablet Take 1 tablet (25 mg total) by mouth at bedtime. 90 tablet   . tamsulosin (FLOMAX) 0.4 MG CAPS capsule Take 0.4 mg by mouth See admin instructions. Take 0.4 mg in the morning may take a second 0.4 mg at night as needed for urinary retention    . timolol (TIMOPTIC) 0.5 %  ophthalmic solution Place 1 drop into both eyes daily.   5  . warfarin (COUMADIN) 3 MG tablet TAKE 2-3 TABLETS DAILY AS DIRECTED BY COUMADIN CLINIC (Patient taking differently: Take 6-9 mg by mouth See admin instructions. Take 6 mg on Mon and Fri.  Take 9 mg on Tue, Wed, Thu, Sat, and Sun) 80 tablet 1  . doxazosin (CARDURA) 2 MG tablet Take 2 mg by mouth daily. In the morning.    . loratadine-pseudoephedrine ([name] 24-HOUR) 10-240 MG 24 hr tablet Take 1 tablet by mouth daily as needed for allergies.     No current facility-administered medications for this encounter.          Allergies  Allergen Reactions  . Grass Extracts [Gramineae Pollens] Other (See Comments)  Sneezing and runny nose  . Milk-Related Compounds     Just Yogurt.  Makes him very lethargic.  Marland Kitchen Amiodarone Anxiety    depression    Social History        Socioeconomic History  . Marital status: Widowed    Spouse name: Not on file  . Number of children: Not on file  . Years of education: Not on file  . Highest education level: Not on file  Occupational History  . 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 Financial resource strain: Not on file  . Food insecurity    Worry: Not on file    Inability: Not on file  . Transportation needs    Medical: Not on file    Non-medical: Not on file  Tobacco Use  . Smoking status: Former Smoker    Packs/day: 0.25    Years: 20.00    Pack years: 5.00    Types: Cigarettes    Quit date: 03/23/1992    Years since quitting: 27.2  . Smokeless tobacco: Never Used  Substance and Sexual Activity  . Alcohol use: Yes    Alcohol/week: 10.0 standard drinks    Types: 10 Glasses of wine per week    Comment: 1-2 drinks daily   . Drug use: No  . Sexual activity: Not on file  Lifestyle  . Physical activity    Days per week: Not on file    Minutes per session: Not on file  . Stress: Not on file  Relationships  . Social  Herbalist on phone: Not on file    Gets together: Not on file    Attends religious service: Not on file    Active member of club or organization: Not on file    Attends meetings of clubs or organizations: Not on file    Relationship status: Not on file  . Intimate partner violence    Fear of current or ex partner: Not on file    Emotionally abused: Not on file    Physically abused: Not on file    Forced sexual activity: Not on file  Other Topics Concern  . Not on file  Social History Narrative   1 caffeine drink daily          Family History  Problem Relation Age of Onset  . Heart disease Mother   . Heart disease Father        before age 38  . AAA (abdominal aortic aneurysm) Father   . Colon cancer Neg Hx     ROS- All systems are reviewed and negative except as per the HPI above  Physical Exam:    Vitals:   06/12/19 1230  BP: 116/72  Pulse: (!) 121  Weight: 87.5 kg  Height: 5\' 10"  (1.778 m)      Wt Readings from Last 3 Encounters:  06/12/19 87.5 kg  06/07/19 87.9 kg  05/25/19 89.4 kg    Labs: Recent Labs       Lab Results  Component Value Date   NA 138 06/07/2019   K 4.1 06/07/2019   CL 106 06/07/2019   CO2 21 (L) 06/07/2019   GLUCOSE 159 (H) 06/07/2019   BUN 15 06/07/2019   CREATININE 1.18 06/07/2019   CALCIUM 9.2 06/07/2019   PHOS 3.2 12/22/2017   MG 2.1 12/22/2017     Recent Labs       Lab Results  Component Value Date   INR 2.6 (H) 06/12/2019  Recent Labs       Lab Results  Component Value Date   CHOL 96 (L) 11/09/2018   HDL 56 11/09/2018   LDLCALC 14 11/09/2018   TRIG 128 11/09/2018       GEN- The patient is well appearing, alert and oriented x 3 today.   Head- normocephalic, atraumatic Eyes-  Sclera clear, conjunctiva pink Ears- hearing intact Oropharynx- clear Neck- supple, no JVP Lymph- no cervical lymphadenopathy Lungs- Clear to ausculation bilaterally,  normal work of breathing Heart- Rapid irregular rate and rhythm, no murmurs, rubs or gallops, PMI not laterally displaced GI- soft, NT, ND, + BS Extremities- no clubbing, cyanosis, or edema MS- no significant deformity or atrophy Skin- no rash or lesion Psych- euthymic mood, full affect Neuro- strength and sensation are intact  EKG-afib with rvr, ILBBB, at 121 bpm, qrs int 110 ms, qtc 479 ms    Assessment and Plan: 1. Paroxysmal afib with recent DCCV with ERAF with RVR Pt cannot take BB's and amiodarone for intolerance, hallucinations and suicidal ideations (remote use in 2015) Dr. Aundra Dubin fears, with his h/o of heart failure, he will decompensate quickly  General education re admit for Tikosyn and what to expect He told me initially that he had checked on price of drug but states he did not do this, but he is prepared to pay the price, " I can afford it" Drugs screened by pharmD, no issues with drug/drug interactions Crcl cal at 56  (creatiinine at 1.19) currently qualifying for 250 mcg dofetilide bid  Kt today at  4.2 , mag is 2.0 He is on warfarin,TEE on 6/11 without any evidence of left atrial thrombus, INR  that day was therapeutic at 4.0, 6/24 at 2.3, and today at 2.6 I discussed the above with Dr. Lovena Le, he is ok with recent TEE and INR  Therapeutic since then without 4 weeks of therapeutic INR's  Dr. Lovena Le is concerned that he may run into issues with prolonged qt and ability to remain on drug but he is willing to try  No benadryl use   To 6E 26  !!! Pt should be a fall risk, he stumbled in the hall while waiting for labs, he has a mild abrasion left elbow.    Geroge Baseman [facility] [address] [place], [place] [address] [phone]   EP Attending  Patient seen and examined. ECG reviewed. Labs reviewed. The patient presents today with atrial fib and an uncontrolled VR, having failed multiple treatment modalities. I have  reviewed the indications/risks/benefits/goals/epectations of initiation of dofetilide and he wishes to proceed. I discussed with Dr. Aundra Dubin as well. He may ultimately require AV node ablation and His bundle pacing.   Mikle Bosworth.D.

## 2019-06-12 NOTE — Plan of Care (Signed)
  Problem: Clinical Measurements: Goal: Ability to maintain clinical measurements within normal limits will improve Outcome: Progressing  Monitoring Potassium and Magnesium while on Tikosyn load.

## 2019-06-12 NOTE — Progress Notes (Addendum)
Primary Care Physician: Altheimer, [name], MD Referring Physician: Dr. Zachery Conch Tavon Magnussen. is a 85 y.o. male with  extensive vascular disease including CAD s/p CABG and redo CABG, AAA s/p repair, PAD, carotid stenosis as  prior AVR , as well as h/o paroxysmal afib, that is in the afib clinic for consideration for Tikosyn admit. He was seen by Dr. Aundra Dubin last week  and found to have ERAF after successful TEE/DCCV  6/11. Pt does  have RVR and has not tolerated BB's  for extreme fatigue or amiodarone in the remote past for suicidal ideations. Dr. 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   Today, he denies symptoms of palpitations, chest pain, shortness of breath, orthopnea, PND, lower extremity edema, dizziness, presyncope, syncope, or neurologic sequela. The patient is tolerating medications without difficulties and is otherwise without complaint today.   Past Medical History:  Diagnosis Date  . AAA (abdominal aortic aneurysm) ([place])   . Atrial fibrillation ([place])   . Atrial fibrillation ([place])   . CAD (coronary artery disease)   . Cataract   . CHF (congestive heart failure) ([place])   . Chronic kidney disease   . Colon polyps    adenomatous  . Diabetes mellitus   . Diverticulosis   . Dysrhythmia    A-Fib  . GERD (gastroesophageal reflux disease)   . Gout   . Hernia, abdominal   . History of aortic valve replacement with bioprosthetic valve    2007  . Hx: UTI (urinary tract infection)   . Hyperlipemia   . Hypertension   . Internal hemorrhoids   . Peripheral vascular disease ([place])   . Shortness of breath    with exertion   Past Surgical History:  Procedure Laterality Date  . ABDOMINAL AORTIC ENDOVASCULAR STENT GRAFT N/A 04/13/2014   Procedure: ABDOMINAL AORTIC ENDOVASCULAR STENT GRAFT;  Surgeon: Serafina Mitchell, MD;  Location: [place];  Service: Vascular;  Laterality: N/A;  . ANGIOPLASTY     Unsure if he had stents put in   . APPENDECTOMY    . CARDIAC VALVE SURGERY    . CARDIOVERSION N/A 06/06/2014   Procedure: CARDIOVERSION;  Surgeon: Larey Dresser, MD;  Location: [facility] ENDOSCOPY;  Service: Cardiovascular;  Laterality: N/A;  . CARDIOVERSION N/A 08/21/2014   Procedure: CARDIOVERSION;  Surgeon: Larey Dresser, MD;  Location: [facility] ENDOSCOPY;  Service: Cardiovascular;  Laterality: N/A;  . CARDIOVERSION N/A 12/22/2017   Procedure: CARDIOVERSION;  Surgeon: Larey Dresser, MD;  Location: [facility] ENDOSCOPY;  Service: Cardiovascular;  Laterality: N/A;  . CARDIOVERSION N/A 05/25/2019   Procedure: CARDIOVERSION;  Surgeon: Larey Dresser, MD;  Location: [facility] ENDOSCOPY;  Service: Cardiovascular;  Laterality: N/A;  . CORONARY ARTERY BYPASS GRAFT    . ENDARTERECTOMY Right 02/02/2018   Procedure: ENDARTERECTOMY CAROTID RIGHT;  Surgeon: Serafina Mitchell, MD;  Location: [place];  Service: Vascular;  Laterality: Right;  . EYE SURGERY Bilateral    cataract surgery  . HERNIA REPAIR     2 operations  . LEFT HEART CATHETERIZATION WITH CORONARY/GRAFT ANGIOGRAM N/A 07/10/2014   Procedure: LEFT HEART CATHETERIZATION WITH Beatrix Fetters;  Surgeon: Larey Dresser, MD;  Location: [facility] CATH LAB;  Service: Cardiovascular;  Laterality: N/A;  . TEE WITHOUT CARDIOVERSION N/A 05/25/2019   Procedure: TRANSESOPHAGEAL ECHOCARDIOGRAM (TEE);  Surgeon: Larey Dresser, MD;  Location: [facility] ENDOSCOPY;  Service: Cardiovascular;  Laterality: N/A;  . TONSILLECTOMY    . VIDEO BRONCHOSCOPY Bilateral 12/25/2015   Procedure: VIDEO BRONCHOSCOPY WITHOUT FLUORO;  Surgeon: Javier Glazier, MD;  Location: [place];  Service: Cardiopulmonary;  Laterality: Bilateral;    Current Outpatient Medications  Medication Sig Dispense Refill  . allopurinol (ZYLOPRIM) 300 MG tablet Take 300 mg by mouth daily.   2  . atorvastatin (LIPITOR) 10 MG tablet TAKE ONE TABLET BY MOUTH ONCE  DAILY (Patient taking differently: Take 10 mg by mouth every other day. ) 30 tablet 10  . atorvastatin (LIPITOR) 20 MG tablet TAKE ONE TABLET EVERY OTHER DAY (Patient taking differently: Take 20 mg by mouth at bedtime. ) 30 tablet 0  . Coenzyme Q10 (CO Q10) 100 MG CAPS Take 100 mg by mouth daily.    Nathaniel [name] doxazosin (CARDURA) 1 MG tablet Take 1 mg by mouth at bedtime.    . empagliflozin (JARDIANCE) 10 MG TABS tablet Take 10 mg by mouth at bedtime.     . Evolocumab (REPATHA SURECLICK) 149 MG/ML SOAJ Inject 1 pen into the skin every 14 (fourteen) days. 2 pen 11  . ezetimibe (ZETIA) 10 MG tablet Take 10 mg by mouth at bedtime.     . finasteride (PROSCAR) 5 MG tablet Take 1 tablet (5 mg total) by mouth at bedtime. 90 tablet 3  . furosemide (LASIX) 20 MG tablet Take 2 tablets (40 mg total) by mouth daily. (Patient taking differently: Take 20-40 mg by mouth See admin instructions. Alternate 20 mg one day and 40 mg the next) 60 tablet 3  . Glucosamine HCl-MSM (GLUCOSAMINE-MSM PO) Take 1 tablet by mouth daily.    Nathaniel [name] latanoprost (XALATAN) 0.005 % ophthalmic solution Place 1 drop into both eyes at bedtime.     . Multiple Vitamins-Minerals (OCUVITE EXTRA PO) Take 1 tablet by mouth daily.    Nathaniel [name] neomycin-bacitracin-polymyxin (NEOSPORIN) ointment Apply 1 application topically as needed for wound care.    . ramipril (ALTACE) 2.5 MG capsule Take 2.5 mg by mouth at bedtime.     . ramipril (ALTACE) 5 MG capsule TAKE ONE CAPSULE EACH DAY (Patient taking differently: Take 5 mg by mouth daily. ) 90 capsule 3  . spironolactone (ALDACTONE) 25 MG tablet Take 1 tablet (25 mg total) by mouth at bedtime. 90 tablet   . tamsulosin (FLOMAX) 0.4 MG CAPS capsule Take 0.4 mg by mouth See admin instructions. Take 0.4 mg in the morning may take a second 0.4 mg at night as needed for urinary retention    . timolol (TIMOPTIC) 0.5 % ophthalmic solution Place 1 drop into both eyes daily.   5  . warfarin (COUMADIN) 3 MG tablet TAKE 2-3 TABLETS  DAILY AS DIRECTED BY COUMADIN CLINIC (Patient taking differently: Take 6-9 mg by mouth See admin instructions. Take 6 mg on Mon and Fri.  Take 9 mg on Tue, Wed, Thu, Sat, and Sun) 80 tablet 1  . doxazosin (CARDURA) 2 MG tablet Take 2 mg by mouth daily. In the morning.    . loratadine-pseudoephedrine ([name] 24-HOUR) 10-240 MG 24 hr tablet Take 1 tablet by mouth daily as needed for allergies.     No current facility-administered medications for this encounter.     Allergies  Allergen Reactions  . Grass Extracts [Gramineae Pollens] Other (See Comments)    Sneezing and runny nose  . Milk-Related Compounds     Just Yogurt.  Makes him very lethargic.  Nathaniel [name]  Amiodarone Anxiety    depression    Social History   Socioeconomic History  . Marital status: Widowed    Spouse name: Not on file  . Number of children: Not on file  . Years of education: Not on file  . Highest education level: Not on file  Occupational History  . 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 Financial resource strain: Not on file  . Food insecurity    Worry: Not on file    Inability: Not on file  . Transportation needs    Medical: Not on file    Non-medical: Not on file  Tobacco Use  . Smoking status: Former Smoker    Packs/day: 0.25    Years: 20.00    Pack years: 5.00    Types: Cigarettes    Quit date: 03/23/1992    Years since quitting: 27.2  . Smokeless tobacco: Never Used  Substance and Sexual Activity  . Alcohol use: Yes    Alcohol/week: 10.0 standard drinks    Types: 10 Glasses of wine per week    Comment: 1-2 drinks daily   . Drug use: No  . Sexual activity: Not on file  Lifestyle  . Physical activity    Days per week: Not on file    Minutes per session: Not on file  . Stress: Not on file  Relationships  . Social Herbalist on phone: Not on file    Gets together: Not on file    Attends religious service: Not on file    Active member of club or organization: Not on file     Attends meetings of clubs or organizations: Not on file    Relationship status: Not on file  . Intimate partner violence    Fear of current or ex partner: Not on file    Emotionally abused: Not on file    Physically abused: Not on file    Forced sexual activity: Not on file  Other Topics Concern  . Not on file  Social History Narrative   1 caffeine drink daily     Family History  Problem Relation Age of Onset  . Heart disease Mother   . Heart disease Father        before age 61  . AAA (abdominal aortic aneurysm) Father   . Colon cancer Neg Hx     ROS- All systems are reviewed and negative except as per the HPI above  Physical Exam: Vitals:   06/12/19 1230  BP: 116/72  Pulse: (!) 121  Weight: 87.5 kg  Height: 5\' 10"  (1.778 m)   Wt Readings from Last 3 Encounters:  06/12/19 87.5 kg  06/07/19 87.9 kg  05/25/19 89.4 kg    Labs: Lab Results  Component Value Date   NA 138 06/07/2019   K 4.1 06/07/2019   CL 106 06/07/2019   CO2 21 (L) 06/07/2019   GLUCOSE 159 (H) 06/07/2019   BUN 15 06/07/2019   CREATININE 1.18 06/07/2019   CALCIUM 9.2 06/07/2019   PHOS 3.2 12/22/2017   MG 2.1 12/22/2017   Lab Results  Component Value Date   INR 2.6 (H) 06/12/2019   Lab Results  Component Value Date   CHOL 96 (L) 11/09/2018   HDL 56 11/09/2018   LDLCALC 14 11/09/2018   TRIG 128 11/09/2018     GEN- The patient is well appearing, alert and oriented x 3 today.   Head- normocephalic, atraumatic Eyes-  Sclera clear, conjunctiva  pink Ears- hearing intact Oropharynx- clear Neck- supple, no JVP Lymph- no cervical lymphadenopathy Lungs- Clear to ausculation bilaterally, normal work of breathing Heart- Rapid irregular rate and rhythm, no murmurs, rubs or gallops, PMI not laterally displaced GI- soft, NT, ND, + BS Extremities- no clubbing, cyanosis, or edema MS- no significant deformity or atrophy Skin- no rash or lesion Psych- euthymic mood, full affect Neuro- strength  and sensation are intact  EKG-afib with rvr, ILBBB, at 121 bpm, qrs int 110 ms, qtc 479 ms    Assessment and Plan: 1. Paroxysmal afib with recent DCCV with ERAF with RVR Pt cannot take BB's and amiodarone for intolerance, hallucinations and suicidal ideations (remote use in 2015) Dr. Aundra Dubin fears, with his h/o of heart failure, he will decompensate quickly  General education re admit for Tikosyn and what to expect He told me initially that he had checked on price of drug but states he did not do this, but he is prepared to pay the price, " I can afford it" Drugs screened by pharmD, no issues with drug/drug interactions Crcl cal at 56  (creatiinine at 1.19) currently qualifying for 250 mcg dofetilide bid  Kt today at  4.2 , mag is 2.0 He is on warfarin,TEE on 6/11 without any evidence of left atrial thrombus, INR  that day was therapeutic at 4.0, 6/24 at 2.3, and today at 2.6 I discussed the above with Dr. Lovena Le, he is ok with recent TEE and INR  Therapeutic since then without 4 weeks of therapeutic INR's  Dr. Lovena Le is concerned that he may run into issues with prolonged qt and ability to remain on drug but he is willing to try  No benadryl use   To 6E 26  !!! Pt should be a fall risk, he stumbled in the hall while waiting for labs, he has a mild abrasion left elbow.    Geroge Baseman , [facility] [address] [place], [place] [address] [phone]

## 2019-06-12 NOTE — Progress Notes (Signed)
Pharmacy Review for Dofetilide (Tikosyn) Initiation  Admit Complaint: 85 y.o. male admitted 06/12/2019 with atrial fibrillation to be initiated on dofetilide.   Assessment:  Patient Exclusion Criteria: If any screening criteria checked as "Yes", then  patient  should NOT receive dofetilide until criteria item is corrected. If "Yes" please indicate correction plan.  YES  NO Patient  Exclusion Criteria Correction Plan  []  [x]  Baseline QTc interval is greater than or equal to 440 msec. IF above YES box checked dofetilide contraindicated unless patient has ICD; then may proceed if QTc 500-550 msec or with known ventricular conduction abnormalities may proceed with QTc 550-600 msec. QTc = 479   []  [x]  Magnesium level is less than 1.8 mEq/l : Last magnesium:  Lab Results  Component Value Date   MG 2.0 06/12/2019         []  [x]  Potassium level is less than 4 mEq/l : Last potassium:  Lab Results  Component Value Date   K 4.2 06/12/2019         []  [x]  Patient is known or suspected to have a digoxin level greater than 2 ng/ml: Lab Results  Component Value Date   DIGOXIN 0.8 09/14/2012      []  [x]  Creatinine clearance less than 20 ml/min (calculated using Cockcroft-Gault, actual body weight and serum creatinine): Estimated Creatinine Clearance: 50.8 mL/min (by C-G formula based on SCr of 1.19 mg/dL).    []  [x]  Patient has received drugs known to prolong the QT intervals within the last 48 hours (phenothiazines, tricyclics or tetracyclic antidepressants, erythromycin, H-1 antihistamines, cisapride, fluoroquinolones, azithromycin). Drugs not listed above may have an, as yet, undetected potential to prolong the QT interval, updated information on QT prolonging agents is available at this website:QT prolonging agents : Instructed to hold Benadryl   []  [x]  Patient received a dose of hydrochlorothiazide (Oretic) alone or in any combination including triamterene (Dyazide, Maxzide) in the last 48  hours.   []  [x]  Patient received a medication known to increase dofetilide plasma concentrations prior to initial dofetilide dose:  . Trimethoprim (Primsol, Proloprim) in the last 36 hours . Verapamil (Calan, Verelan) in the last 36 hours or a sustained release dose in the last 72 hours . Megestrol (Megace) in the last 5 days  . Cimetidine (Tagamet) in the last 6 hours . Ketoconazole (Nizoral) in the last 24 hours . Itraconazole (Sporanox) in the last 48 hours  . Prochlorperazine (Compazine) in the last 36 hours    []  [x]  Patient is known to have a history of torsades de pointes; congenital or acquired long QT syndromes.   []  [x]  Patient has received a Class 1 antiarrhythmic with less than 2 half-lives since last dose. (Disopyramide, Quinidine, Procainamide, Lidocaine, Mexiletine, Flecainide, Propafenone)   []  [x]  Patient has received amiodarone therapy in the past 3 months or amiodarone level is greater than 0.3 ng/ml.    Patient has been appropriately anticoagulated with Warfarin.  Ordering provider was confirmed at [email or web address] if they are not listed on the [place] Prescribers list.  Goal of Therapy: Follow renal function, electrolytes, potential drug interactions, and dose adjustment. Provide education and 1 week supply at discharge.  Plan:  [x]   Physician selected initial dose within range recommended for patients level of renal function - will monitor for response.  []   Physician selected initial dose outside of range recommended for patients level of renal function - will discuss if the dose should be altered at this time.   Select One Calculated  CrCl  Dose q12h  []  > 60 ml/min 500 mcg  [x]  40-60 ml/min 250 mcg  []  20-40 ml/min 125 mcg   2. Follow up QTc after the first 5 doses, renal function, electrolytes (K & Mg) daily x 3 days, dose adjustment, success of initiation and facilitate 1 week discharge supply as clinically indicated.  3. Initiate Tikosyn  education video (Call [phone] and ask for video # 116).  4. Place Enrollment Form on the chart for discharge supply of dofetilide.  Jennine Peddy S. Alford Highland, PharmD, [place] Clinical Staff Pharmacist Eilene Ghazi Stillinger 4:09 PM 06/12/2019

## 2019-06-13 LAB — BASIC METABOLIC PANEL
Anion gap: 10 (ref 5–15)
BUN: 19 mg/dL (ref 8–23)
CO2: 26 mmol/L (ref 22–32)
Calcium: 8.5 mg/dL — ABNORMAL LOW (ref 8.9–10.3)
Chloride: 103 mmol/L (ref 98–111)
Creatinine, Ser: 1.06 mg/dL (ref 0.61–1.24)
GFR calc Af Amer: >60 mL/min (ref >60)
GFR calc non Af Amer: >60 mL/min (ref >60)
Glucose, Bld: 126 mg/dL — ABNORMAL HIGH (ref 70–99)
Potassium: 3.5 mmol/L (ref 3.5–5.1)
Sodium: 139 mmol/L (ref 135–145)

## 2019-06-13 LAB — PROTIME-INR
INR: 3 — ABNORMAL HIGH (ref 0.8–1.2)
Prothrombin Time: 30.4 seconds — ABNORMAL HIGH (ref 11.4–15.2)

## 2019-06-13 LAB — MAGNESIUM: Magnesium: 1.9 mg/dL (ref 1.7–2.4)

## 2019-06-13 MED ORDER — MAGNESIUM SULFATE IN D5W 1-5 GM/100ML-% IV SOLN
1.0000 g | Freq: Once | INTRAVENOUS | Status: AC
  Administered 2019-06-13: 1 g via INTRAVENOUS
  Filled 2019-06-13: qty 100

## 2019-06-13 MED ORDER — HYDROCORTISONE 1 % EX CREA
1.0000 "application " | TOPICAL_CREAM | Freq: Three times a day (TID) | CUTANEOUS | Status: DC | PRN
  Filled 2019-06-13: qty 28

## 2019-06-13 MED ORDER — PROSIGHT PO TABS
1.0000 | ORAL_TABLET | Freq: Every day | ORAL | Status: DC
  Administered 2019-06-13 – 2019-06-15 (×3): 1 via ORAL
  Filled 2019-06-13 (×3): qty 1

## 2019-06-13 MED ORDER — SODIUM CHLORIDE 0.9% FLUSH
3.0000 mL | Freq: Two times a day (BID) | INTRAVENOUS | Status: DC
  Administered 2019-06-13 – 2019-06-15 (×4): 3 mL via INTRAVENOUS

## 2019-06-13 MED ORDER — SODIUM CHLORIDE 0.9 % IV SOLN
250.0000 mL | INTRAVENOUS | Status: DC
  Administered 2019-06-14: 11:00:00 via INTRAVENOUS

## 2019-06-13 MED ORDER — SODIUM CHLORIDE 0.9% FLUSH: 3.0000 mL | INTRAVENOUS | Status: DC | PRN

## 2019-06-13 MED ORDER — POTASSIUM CHLORIDE CRYS ER 20 MEQ PO TBCR
40.0000 meq | EXTENDED_RELEASE_TABLET | ORAL | Status: AC
  Administered 2019-06-13: 20 meq via ORAL
  Administered 2019-06-13: 40 meq via ORAL
  Filled 2019-06-13 (×2): qty 2

## 2019-06-13 NOTE — H&P (View-Only) (Signed)
Progress Note  Patient Name: Nathaniel [name]. Date of Encounter: 06/13/2019  Primary Cardiologist: Dr. Aundra Dubin  Subjective   No complaints, no CP or SOB  Inpatient Medications    Scheduled Meds: . allopurinol  300 mg Oral Daily  . atorvastatin  20 mg Oral QODAY  . atorvastatin  30 mg Oral QODAY  . canagliflozin  100 mg Oral QAC breakfast  . dofetilide  250 mcg Oral BID  . doxazosin  1 mg Oral QHS  . doxazosin  2 mg Oral q morning - 10a  . ezetimibe  10 mg Oral QHS  . finasteride  5 mg Oral QHS  . furosemide  20 mg Oral Daily  . latanoprost  1 drop Both Eyes QHS  . multivitamin  1 tablet Oral Daily  . potassium chloride  40 mEq Oral Q4H  . ramipril  2.5 mg Oral QPM  . ramipril  5 mg Oral q morning - 10a  . sodium chloride flush  3 mL Intravenous Q12H  . spironolactone  25 mg Oral QHS  . tamsulosin  0.4 mg Oral Daily  . timolol  1 drop Both Eyes Daily  . warfarin  6 mg Oral Once per day on Mon Fri  . warfarin  9 mg Oral Once per day on Sun Tue Wed Thu Sat  . Warfarin - Pharmacist Dosing Inpatient   Does not apply q1800   Continuous Infusions: . magnesium sulfate bolus IVPB     PRN Meds: sodium chloride flush   Vital Signs    Vitals:   06/12/19 1500 06/12/19 2050 06/13/19 0643  BP: 120/84 112/60 118/80  Pulse: (!) 103 (!) 112 93  Resp:  18 18  Temp: (!) 97.5 F (36.4 C) 97.8 F (36.6 C) 98.4 F (36.9 C)  TempSrc: Oral Oral Oral  SpO2: 98% 95% 93%  Weight: 88.4 kg    Height: 5\' 10"  (1.778 m)      Intake/Output Summary (Last 24 hours) at 06/13/2019 0726 Last data filed at 06/12/2019 2100 Gross per 24 hour  Intake 360 ml  Output -  Net 360 ml   Last 3 Weights 06/12/2019 06/12/2019 06/07/2019  Weight (lbs) 194 lb 14.4 oz 193 lb 193 lb 12.8 oz  Weight (kg) 88.406 kg 87.544 kg 87.907 kg      Telemetry    AFib, 100-110's, occ PVCs, noted prior to drug as well - Personally Reviewed  ECG    AFib, 100bpm, measured QT 367ms, QTc 452ms, reviewed with Dr.  Lovena Le - Personally Reviewed  Physical Exam   GEN: No acute distress.   Neck: No JVD Cardiac: irreg-irreg, no murmurs, rubs, or gallops.  Respiratory: Clear to auscultation bilaterally. GI: Soft, nontender, non-distended  MS: trace edema; No deformity. Neuro:  Nonfocal  Psych: Normal affect   Labs    High Sensitivity Troponin:  No results for input(s): TROPONINIHS in the last 720 hours.    Cardiac EnzymesNo results for input(s): TROPONINI in the last 168 hours. No results for input(s): TROPIPOC in the last 168 hours.   Chemistry Recent Labs  Lab 06/07/19 1157 06/12/19 1242 06/13/19 0429  NA 138 140 139  K 4.1 4.2 3.5  CL 106 105 103  CO2 21* 25 26  GLUCOSE 159* 114* 126*  BUN 15 19 19   CREATININE 1.18 1.19 1.06  CALCIUM 9.2 8.9 8.5*  PROT 6.6  --   --   ALBUMIN 3.9  --   --   AST 25  --   --  ALT 17  --   --   ALKPHOS 58  --   --   BILITOT 1.0  --   --   GFRNONAA 56* 55* >60  GFRAA >60 >60 >60  ANIONGAP 11 10 10      HematologyNo results for input(s): WBC, RBC, HGB, HCT, MCV, MCH, MCHC, RDW, PLT in the last 168 hours.  BNPNo results for input(s): BNP, PROBNP in the last 168 hours.   DDimer No results for input(s): DDIMER in the last 168 hours.   Radiology    No results found.  Cardiac Studies    05/25/2019 TEE IMPRESSIONS  1. The right ventricle has mildly reduced systolic function. The cavity was mildly enlarged.  2. Left atrial size was moderately dilated.  3. Trivial pericardial effusion is present.  4. Mild calcification of the mitral valve leaflet. No evidence of mitral valve stenosis. Mild mitral regurgitation.  5. There was a bioprosthetic aortic valve that appeared to function normally. No evidence for stenosis or significant regurgitation.  6. The aortic root, ascending aorta and descending aorta are normal in size and structure. Mild plaque in descending thoracic aorta and arch.  7. The left ventricle had a visually estimated ejection fraction  of of 50%. Left ventrical global hypokinesis without regional wall motion abnormalities.  8. No evidence of a thrombus present in the left atrial appendage.  FINDINGS  Left Ventricle: The left ventricle has a visually estimated ejection fraction of of 50%. Left ventrical global hypokinesis without regional wall motion abnormalities.  Right Ventricle: The right ventricle has mildly reduced systolic function. The cavity was mildly enlarged.  Left Atrium: Left atrial size was moderately dilated.   Left Atrial Appendage: No evidence of a thrombus present in the left atrial appendage.  Right Atrium: Right atrial size was mild-moderately dilated. Right atrial pressure is estimated at 10 mmHg.  Interatrial Septum: No atrial level shunt detected by color flow Doppler.  Pericardium: Trivial pericardial effusion is present.  Mitral Valve: The mitral valve is normal in structure. Mild calcification of the mitral valve leaflet. Mitral valve regurgitation is mild by color flow Doppler. No evidence of mitral valve stenosis.  Tricuspid Valve: The tricuspid valve was normal in structure. Tricuspid valve regurgitation is trivial by color flow Doppler.  Aortic Valve: The aortic valve has been repaired/replaced. A bioprosthetic aortic valve valve is present in the aortic position. There was a bioprosthetic aortic valve that appeared to function normally. No evidence for stenosis or significant  regurgitation.  Aorta: The aortic root and ascending aorta are normal in size and structure.  Patient Profile     85 y.o. male w/ PMHX of CAD (CABG and redo CABG, AAA repair, VHD w/ biorpopsthetic AVR, HTN, DM, HLD, chronic CHF (systolic), and paroxysmal AFib   Assessment & Plan    1. Paroxysmal AFib     CHA2DS2Vasc is 6, on warfarin     Tikosyn load in progress      K+ 3.5 (replacement ordered)      Mag 1.9 (replacement ordered)     Creat 1.06, stable     QT stable  INR 3.0  DCCV tomorrow  if not in SR, pt is aware and agreeable       2. CAD     No anginal symptoms     Continue home meds  3. VHD     H/o AVR (bioprosthetic)  4. DM    Continue home meds  5. HTN     Looks OK,  no changes today  5. Chronic CHF (systolic/diastolic)     Recovered LVEF by last echo     Trace edema     Does not appear overtly volume OL currently     Continue home meds  For questions or updates, please contact [place] Please consult [email or web address] for contact info under     Signed, Baldwin Jamaica, PA-C  06/13/2019, 7:26 AM    EP Attending  Patient seen and examined. Agree with above. Ok to continue dofetilide. QT interval is acceptable. We will follow renal function, and electrolytes.  Mikle Bosworth.D.

## 2019-06-13 NOTE — Progress Notes (Addendum)
Progress Note  Patient Name: Nathaniel [name]. Date of Encounter: 06/13/2019  Primary Cardiologist: Dr. Aundra Dubin  Subjective   No complaints, no CP or SOB  Inpatient Medications    Scheduled Meds: . allopurinol  300 mg Oral Daily  . atorvastatin  20 mg Oral QODAY  . atorvastatin  30 mg Oral QODAY  . canagliflozin  100 mg Oral QAC breakfast  . dofetilide  250 mcg Oral BID  . doxazosin  1 mg Oral QHS  . doxazosin  2 mg Oral q morning - 10a  . ezetimibe  10 mg Oral QHS  . finasteride  5 mg Oral QHS  . furosemide  20 mg Oral Daily  . latanoprost  1 drop Both Eyes QHS  . multivitamin  1 tablet Oral Daily  . potassium chloride  40 mEq Oral Q4H  . ramipril  2.5 mg Oral QPM  . ramipril  5 mg Oral q morning - 10a  . sodium chloride flush  3 mL Intravenous Q12H  . spironolactone  25 mg Oral QHS  . tamsulosin  0.4 mg Oral Daily  . timolol  1 drop Both Eyes Daily  . warfarin  6 mg Oral Once per day on Mon Fri  . warfarin  9 mg Oral Once per day on Sun Tue Wed Thu Sat  . Warfarin - Pharmacist Dosing Inpatient   Does not apply q1800   Continuous Infusions: . magnesium sulfate bolus IVPB     PRN Meds: sodium chloride flush   Vital Signs    Vitals:   06/12/19 1500 06/12/19 2050 06/13/19 0643  BP: 120/84 112/60 118/80  Pulse: (!) 103 (!) 112 93  Resp:  18 18  Temp: (!) 97.5 F (36.4 C) 97.8 F (36.6 C) 98.4 F (36.9 C)  TempSrc: Oral Oral Oral  SpO2: 98% 95% 93%  Weight: 88.4 kg    Height: 5\' 10"  (1.778 m)      Intake/Output Summary (Last 24 hours) at 06/13/2019 0726 Last data filed at 06/12/2019 2100 Gross per 24 hour  Intake 360 ml  Output -  Net 360 ml   Last 3 Weights 06/12/2019 06/12/2019 06/07/2019  Weight (lbs) 194 lb 14.4 oz 193 lb 193 lb 12.8 oz  Weight (kg) 88.406 kg 87.544 kg 87.907 kg      Telemetry    AFib, 100-110's, occ PVCs, noted prior to drug as well - Personally Reviewed  ECG    AFib, 100bpm, measured QT 378ms, QTc 492ms, reviewed with Dr.  Lovena Le - Personally Reviewed  Physical Exam   GEN: No acute distress.   Neck: No JVD Cardiac: irreg-irreg, no murmurs, rubs, or gallops.  Respiratory: Clear to auscultation bilaterally. GI: Soft, nontender, non-distended  MS: trace edema; No deformity. Neuro:  Nonfocal  Psych: Normal affect   Labs    High Sensitivity Troponin:  No results for input(s): TROPONINIHS in the last 720 hours.    Cardiac EnzymesNo results for input(s): TROPONINI in the last 168 hours. No results for input(s): TROPIPOC in the last 168 hours.   Chemistry Recent Labs  Lab 06/07/19 1157 06/12/19 1242 06/13/19 0429  NA 138 140 139  K 4.1 4.2 3.5  CL 106 105 103  CO2 21* 25 26  GLUCOSE 159* 114* 126*  BUN 15 19 19   CREATININE 1.18 1.19 1.06  CALCIUM 9.2 8.9 8.5*  PROT 6.6  --   --   ALBUMIN 3.9  --   --   AST 25  --   --  ALT 17  --   --   ALKPHOS 58  --   --   BILITOT 1.0  --   --   GFRNONAA 56* 55* >60  GFRAA >60 >60 >60  ANIONGAP 11 10 10      HematologyNo results for input(s): WBC, RBC, HGB, HCT, MCV, MCH, MCHC, RDW, PLT in the last 168 hours.  BNPNo results for input(s): BNP, PROBNP in the last 168 hours.   DDimer No results for input(s): DDIMER in the last 168 hours.   Radiology    No results found.  Cardiac Studies    05/25/2019 TEE IMPRESSIONS  1. The right ventricle has mildly reduced systolic function. The cavity was mildly enlarged.  2. Left atrial size was moderately dilated.  3. Trivial pericardial effusion is present.  4. Mild calcification of the mitral valve leaflet. No evidence of mitral valve stenosis. Mild mitral regurgitation.  5. There was a bioprosthetic aortic valve that appeared to function normally. No evidence for stenosis or significant regurgitation.  6. The aortic root, ascending aorta and descending aorta are normal in size and structure. Mild plaque in descending thoracic aorta and arch.  7. The left ventricle had a visually estimated ejection fraction  of of 50%. Left ventrical global hypokinesis without regional wall motion abnormalities.  8. No evidence of a thrombus present in the left atrial appendage.  FINDINGS  Left Ventricle: The left ventricle has a visually estimated ejection fraction of of 50%. Left ventrical global hypokinesis without regional wall motion abnormalities.  Right Ventricle: The right ventricle has mildly reduced systolic function. The cavity was mildly enlarged.  Left Atrium: Left atrial size was moderately dilated.   Left Atrial Appendage: No evidence of a thrombus present in the left atrial appendage.  Right Atrium: Right atrial size was mild-moderately dilated. Right atrial pressure is estimated at 10 mmHg.  Interatrial Septum: No atrial level shunt detected by color flow Doppler.  Pericardium: Trivial pericardial effusion is present.  Mitral Valve: The mitral valve is normal in structure. Mild calcification of the mitral valve leaflet. Mitral valve regurgitation is mild by color flow Doppler. No evidence of mitral valve stenosis.  Tricuspid Valve: The tricuspid valve was normal in structure. Tricuspid valve regurgitation is trivial by color flow Doppler.  Aortic Valve: The aortic valve has been repaired/replaced. A bioprosthetic aortic valve valve is present in the aortic position. There was a bioprosthetic aortic valve that appeared to function normally. No evidence for stenosis or significant  regurgitation.  Aorta: The aortic root and ascending aorta are normal in size and structure.  Patient Profile     85 y.o. male w/ PMHX of CAD (CABG and redo CABG, AAA repair, VHD w/ biorpopsthetic AVR, HTN, DM, HLD, chronic CHF (systolic), and paroxysmal AFib   Assessment & Plan    1. Paroxysmal AFib     CHA2DS2Vasc is 6, on warfarin     Tikosyn load in progress      K+ 3.5 (replacement ordered)      Mag 1.9 (replacement ordered)     Creat 1.06, stable     QT stable  INR 3.0  DCCV tomorrow  if not in SR, pt is aware and agreeable       2. CAD     No anginal symptoms     Continue home meds  3. VHD     H/o AVR (bioprosthetic)  4. DM    Continue home meds  5. HTN     Looks OK,  no changes today  5. Chronic CHF (systolic/diastolic)     Recovered LVEF by last echo     Trace edema     Does not appear overtly volume OL currently     Continue home meds  For questions or updates, please contact [place] Please consult [email or web address] for contact info under     Signed, Baldwin Jamaica, PA-C  06/13/2019, 7:26 AM    EP Attending  Patient seen and examined. Agree with above. Ok to continue dofetilide. QT interval is acceptable. We will follow renal function, and electrolytes.  Mikle Bosworth.D.

## 2019-06-13 NOTE — TOC Benefit Eligibility Note (Signed)
Transition of Care [facility]) Benefit Eligibility Note    Patient Details  Name: Nathaniel [name]. MRN: [id number] Date of Birth: 02-08-1932   Medication/Dose: DOFETILIDE  250 MCG BID  Covered?: Yes  Tier: (4 DRUG)  Prescription Coverage Preferred Pharmacy: Roseanne Kaufman with Person/Company/Phone Number:: [facility]  @   [employer] # [phone]  Co-Pay: 45 % OF TOTAL COST  Prior Approval: [employer]([phone] OPT- 5)  Deductible: Met  Additional Notes: TIKOSYN 250 MCG BID, COVER: NON-FORMULARY , PRIOR APPROVAL- YES # [phone]    Memory Argue Phone Number: 06/13/2019, 4:27 PM

## 2019-06-13 NOTE — Progress Notes (Signed)
ANTICOAGULATION CONSULT NOTE - Initial Consult  Pharmacy Consult for Warfarin Indication: atrial fibrillation  Patient Measurements: Height: 5\' 10"  (177.8 cm) Weight: 194 lb 14.4 oz (88.4 kg) IBW/kg (Calculated) : 73  Vital Signs: Temp: 98.4 F (36.9 C) (06/30 0643) Temp Source: Oral (06/30 0643) BP: 118/80 (06/30 0643) Pulse Rate: 93 (06/30 0643)  Labs: Recent Labs    06/12/19 1242 06/13/19 0429  LABPROT 27.0* 30.4*  INR 2.6* 3.0*  CREATININE 1.19 1.06     Medical History: Past Medical History:  Diagnosis Date  . AAA (abdominal aortic aneurysm) ([place])   . Atrial fibrillation ([place])   . Atrial fibrillation ([place])   . CAD (coronary artery disease)   . Cataract   . CHF (congestive heart failure) ([place])   . Chronic kidney disease   . Colon polyps    adenomatous  . Diabetes mellitus   . Diverticulosis   . Dysrhythmia    A-Fib  . GERD (gastroesophageal reflux disease)   . Gout   . Hernia, abdominal   . History of aortic valve replacement with bioprosthetic valve    2007  . Hx: UTI (urinary tract infection)   . Hyperlipemia   . Hypertension   . Internal hemorrhoids   . Peripheral vascular disease ([place])   . Shortness of breath    with exertion   Assessment: CC/HPI: Tikosyn admission  PMH: TEE/CV 6/9.extensive vascular disease including CAD s/p CABG 2007 and redo CABG, AAA s/p repair 5/15, PAD, and carotid stenosis as well as prior AVR 2007 bioprosthetic, , cataracts, CHF, CKD, DM, diverticulosis, GERD, gout, abdominal hernia, HTN, HLD, hemorrhoids,    Anticoag: Warfarin PTA for afib. TEE on 6/11 without any evidence of left atrial thrombus, INR currently therapeutic at 3.  - PTA warfarin: 6mg  M/F and 9mg  all other days.  Goal of Therapy:  INR 2-3 Monitor platelets by anticoagulation protocol: Yes    Plan:  -Tikosyn 250 BID -Continue warfarin 6 mg M/F and 9 mg all other days -Daily INR   Harvel Quale 06/13/2019 10:18 AM

## 2019-06-14 ENCOUNTER — Encounter (HOSPITAL_COMMUNITY): Payer: Self-pay | Admitting: Cardiology

## 2019-06-14 ENCOUNTER — Encounter (HOSPITAL_COMMUNITY)
Admission: RE | Disposition: A | Discharge status: Home / Self Care | Payer: Self-pay | Source: Ambulatory Visit | Attending: Internal Medicine

## 2019-06-14 ENCOUNTER — Inpatient Hospital Stay (HOSPITAL_COMMUNITY): Payer: Medicare Other | Admitting: Anesthesiology

## 2019-06-14 ENCOUNTER — Ambulatory Visit (HOSPITAL_COMMUNITY): Admission: RE | Admit: 2019-06-14 | Payer: Medicare Other | Source: Home / Self Care | Admitting: Cardiology

## 2019-06-14 DIAGNOSIS — I480 Paroxysmal atrial fibrillation: Principal | ICD-10-CM

## 2019-06-14 DIAGNOSIS — I5042 Chronic combined systolic (congestive) and diastolic (congestive) heart failure: Secondary | ICD-10-CM

## 2019-06-14 HISTORY — PX: CARDIOVERSION: SHX1299

## 2019-06-14 LAB — BASIC METABOLIC PANEL
Anion gap: 11 (ref 5–15)
BUN: 14 mg/dL (ref 8–23)
CO2: 27 mmol/L (ref 22–32)
Calcium: 8.8 mg/dL — ABNORMAL LOW (ref 8.9–10.3)
Chloride: 101 mmol/L (ref 98–111)
Creatinine, Ser: 1.14 mg/dL (ref 0.61–1.24)
GFR calc Af Amer: >60 mL/min (ref >60)
GFR calc non Af Amer: 58 mL/min — ABNORMAL LOW (ref >60)
Glucose, Bld: 93 mg/dL (ref 70–99)
Potassium: 4.1 mmol/L (ref 3.5–5.1)
Sodium: 139 mmol/L (ref 135–145)

## 2019-06-14 LAB — PROTIME-INR
INR: 3.7 — ABNORMAL HIGH (ref 0.8–1.2)
Prothrombin Time: 36.3 seconds — ABNORMAL HIGH (ref 11.4–15.2)

## 2019-06-14 LAB — MAGNESIUM: Magnesium: 2.2 mg/dL (ref 1.7–2.4)

## 2019-06-14 SURGERY — CARDIOVERSION
Anesthesia: General

## 2019-06-14 MED ORDER — PROPOFOL 10 MG/ML IV BOLUS
INTRAVENOUS | Status: DC | PRN
  Administered 2019-06-14: 70 mg via INTRAVENOUS

## 2019-06-14 MED ORDER — LIDOCAINE HCL (CARDIAC) PF 100 MG/5ML IV SOSY
PREFILLED_SYRINGE | INTRAVENOUS | Status: DC | PRN
  Administered 2019-06-14: 60 mg via INTRAVENOUS

## 2019-06-14 MED ORDER — SODIUM CHLORIDE 0.9 % IV SOLN
INTRAVENOUS | Status: DC
  Administered 2019-06-14: 10:00:00 via INTRAVENOUS

## 2019-06-14 NOTE — Anesthesia Preprocedure Evaluation (Signed)
Anesthesia Evaluation  Patient identified by MRN, date of birth, ID band Patient awake    Reviewed: Allergy & Precautions, NPO status , Patient's Chart, lab work & pertinent test results  Airway Mallampati: II  TM Distance: >3 FB     Dental   Pulmonary shortness of breath, former smoker,    breath sounds clear to auscultation       Cardiovascular hypertension, + CAD, + Peripheral Vascular Disease and +CHF  + dysrhythmias  Rhythm:Irregular Rate:Normal     Neuro/Psych    GI/Hepatic Neg liver ROS, GERD  ,  Endo/Other  diabetes  Renal/GU Renal disease     Musculoskeletal   Abdominal   Peds  Hematology   Anesthesia Other Findings   Reproductive/Obstetrics                             Anesthesia Physical Anesthesia Plan  ASA: III  Anesthesia Plan: General   Post-op Pain Management:    Induction: Intravenous  PONV Risk Score and Plan: Treatment may vary due to age or medical condition  Airway Management Planned: Nasal Cannula, Simple Face Mask and Mask  Additional Equipment:   Intra-op Plan:   Post-operative Plan:   Informed Consent: I have reviewed the patients History and Physical, chart, labs and discussed the procedure including the risks, benefits and alternatives for the proposed anesthesia with the patient or authorized representative who has indicated his/her understanding and acceptance.     Dental advisory given  Plan Discussed with: CRNA  Anesthesia Plan Comments:         Anesthesia Quick Evaluation

## 2019-06-14 NOTE — Anesthesia Procedure Notes (Signed)
Procedure Name: General with mask airway Date/Time: 06/14/2019 10:51 AM Performed by: Eligha Bridegroom, CRNA Pre-anesthesia Checklist: Patient identified Patient Re-evaluated:Patient Re-evaluated prior to induction Oxygen Delivery Method: Nasal cannula Preoxygenation: Pre-oxygenation with 100% oxygen Induction Type: IV induction

## 2019-06-14 NOTE — Plan of Care (Signed)
  Problem: Education: Goal: Knowledge of General Education information will improve Description: Including pain rating scale, medication(s)/side effects and non-pharmacologic comfort measures Outcome: Progressing   Problem: Clinical Measurements: Goal: Ability to maintain clinical measurements within normal limits will improve Outcome: Progressing   Problem: Safety: Goal: Ability to remain free from injury will improve Outcome: Progressing   

## 2019-06-14 NOTE — Transfer of Care (Signed)
Immediate Anesthesia Transfer of Care Note  Patient: Nathaniel [name].  Procedure(s) Performed: CARDIOVERSION (N/A )  Patient Location: Endoscopy Unit  Anesthesia Type:General  Level of Consciousness: awake, alert  and oriented  Airway & Oxygen Therapy: Patient Spontanous Breathing and Patient connected to nasal cannula oxygen  Post-op Assessment: Report given to RN and Post -op Vital signs reviewed and stable  Post vital signs: Reviewed and stable  Last Vitals:  Vitals Value Taken Time  BP 105/69 06/14/19 1101  Temp 36.2 C 06/14/19 1101  Pulse 61 06/14/19 1104  Resp 28 06/14/19 1104  SpO2 92 % 06/14/19 1104    Last Pain:  Vitals:   06/14/19 1101  TempSrc: Temporal  PainSc:       Patients Stated Pain Goal: 0 (27/25/36 6440)  Complications: No apparent anesthesia complications

## 2019-06-14 NOTE — Interval H&P Note (Signed)
History and Physical Interval Note:  06/14/2019 9:40 AM  [name] [name].  has presented today for surgery, with the diagnosis of AFib.  The various methods of treatment have been discussed with the patient and family. After consideration of risks, benefits and other options for treatment, the patient has consented to  Procedure(s): CARDIOVERSION (N/A) as a surgical intervention.  The patient's history has been reviewed, patient examined, no change in status, stable for surgery.  I have reviewed the patient's chart and labs.  Questions were answered to the patient's satisfaction.     [employer]

## 2019-06-14 NOTE — CV Procedure (Signed)
    Electrical Cardioversion Procedure Note Nathaniel [name] [id number] 09/09/1932  Procedure: Electrical Cardioversion Indications:  Atrial Fibrillation  Time Out: Verified patient identification, verified procedure,medications/allergies/relevent history reviewed, required imaging and test results available.  Performed  Procedure Details  The patient was NPO after midnight. Anesthesia was administered at the beside  by Dr. Nyoka Cowden with 70mg  of propofol.  Cardioversion was performed with synchronized biphasic defibrillation via AP pads with 120 joules.  1 attempt(s) were performed.  The patient converted to normal sinus rhythm. The patient tolerated the procedure well   IMPRESSION:  Successful cardioversion of atrial fibrillation    Nathaniel [name] 06/14/2019, 10:58 AM

## 2019-06-14 NOTE — Progress Notes (Addendum)
Progress Note  Patient Name: Nathaniel [name]. Date of Encounter: 06/14/2019  Primary Cardiologist: Dr. Aundra Dubin  Subjective   No complaints, no CP or SOB, hungry this morning  Inpatient Medications    Scheduled Meds: . allopurinol  300 mg Oral Daily  . atorvastatin  20 mg Oral QODAY  . atorvastatin  30 mg Oral QODAY  . canagliflozin  100 mg Oral QAC breakfast  . dofetilide  250 mcg Oral BID  . doxazosin  1 mg Oral QHS  . doxazosin  2 mg Oral q morning - 10a  . ezetimibe  10 mg Oral QHS  . finasteride  5 mg Oral QHS  . furosemide  20 mg Oral Daily  . latanoprost  1 drop Both Eyes QHS  . multivitamin  1 tablet Oral Daily  . ramipril  2.5 mg Oral QPM  . ramipril  5 mg Oral q morning - 10a  . sodium chloride flush  3 mL Intravenous Q12H  . sodium chloride flush  3 mL Intravenous Q12H  . spironolactone  25 mg Oral QHS  . tamsulosin  0.4 mg Oral Daily  . timolol  1 drop Both Eyes Daily  . warfarin  6 mg Oral Once per day on Mon Fri  . warfarin  9 mg Oral Once per day on Sun Tue Wed Thu Sat  . Warfarin - Pharmacist Dosing Inpatient   Does not apply q1800   Continuous Infusions: . sodium chloride     PRN Meds: hydrocortisone cream, sodium chloride flush, sodium chloride flush   Vital Signs    Vitals:   06/13/19 1300 06/13/19 2039 06/14/19 0500 06/14/19 0539  BP: 115/70 111/79  125/86  Pulse: 93 83  89  Resp:  18  18  Temp: 98 F (36.7 C) 97.8 F (36.6 C)  97.6 F (36.4 C)  TempSrc: Oral Oral  Oral  SpO2: 94% 97%  95%  Weight:   85.9 kg   Height:       No intake or output data in the 24 hours ending 06/14/19 0800 Last 3 Weights 06/14/2019 06/12/2019 06/12/2019  Weight (lbs) 189 lb 6.4 oz 194 lb 14.4 oz 193 lb  Weight (kg) 85.911 kg 88.406 kg 87.544 kg      Telemetry    AFib, 90's generally, intermittently faster, infrequent PVCs - Personally Reviewed  ECG    AFib, 111bpm, QT looks OK - Personally Reviewed  Physical Exam   Exam is unchanged GEN: No acute  distress.   Neck: No JVD Cardiac: irreg-irreg, no murmurs, rubs, or gallops.  Respiratory: Clear to auscultation bilaterally. GI: Soft, nontender, non-distended  MS: trace edema; No deformity. Neuro:  Nonfocal  Psych: Normal affect   Labs    High Sensitivity Troponin:  No results for input(s): TROPONINIHS in the last 720 hours.    Cardiac EnzymesNo results for input(s): TROPONINI in the last 168 hours. No results for input(s): TROPIPOC in the last 168 hours.   Chemistry Recent Labs  Lab 06/07/19 1157 06/12/19 1242 06/13/19 0429 06/14/19 0505  NA 138 140 139 139  K 4.1 4.2 3.5 4.1  CL 106 105 103 101  CO2 21* 25 26 27   GLUCOSE 159* 114* 126* 93  BUN 15 19 19 14   CREATININE 1.18 1.19 1.06 1.14  CALCIUM 9.2 8.9 8.5* 8.8*  PROT 6.6  --   --   --   ALBUMIN 3.9  --   --   --   AST 25  --   --   --  ALT 17  --   --   --   ALKPHOS 58  --   --   --   BILITOT 1.0  --   --   --   GFRNONAA 56* 55* >60 58*  GFRAA >60 >60 >60 >60  ANIONGAP 11 10 10 11      HematologyNo results for input(s): WBC, RBC, HGB, HCT, MCV, MCH, MCHC, RDW, PLT in the last 168 hours.  BNPNo results for input(s): BNP, PROBNP in the last 168 hours.   DDimer No results for input(s): DDIMER in the last 168 hours.   Radiology    No results found.  Cardiac Studies    05/25/2019 TEE IMPRESSIONS  1. The right ventricle has mildly reduced systolic function. The cavity was mildly enlarged.  2. Left atrial size was moderately dilated.  3. Trivial pericardial effusion is present.  4. Mild calcification of the mitral valve leaflet. No evidence of mitral valve stenosis. Mild mitral regurgitation.  5. There was a bioprosthetic aortic valve that appeared to function normally. No evidence for stenosis or significant regurgitation.  6. The aortic root, ascending aorta and descending aorta are normal in size and structure. Mild plaque in descending thoracic aorta and arch.  7. The left ventricle had a visually  estimated ejection fraction of of 50%. Left ventrical global hypokinesis without regional wall motion abnormalities.  8. No evidence of a thrombus present in the left atrial appendage.  FINDINGS  Left Ventricle: The left ventricle has a visually estimated ejection fraction of of 50%. Left ventrical global hypokinesis without regional wall motion abnormalities.  Right Ventricle: The right ventricle has mildly reduced systolic function. The cavity was mildly enlarged.  Left Atrium: Left atrial size was moderately dilated.   Left Atrial Appendage: No evidence of a thrombus present in the left atrial appendage.  Right Atrium: Right atrial size was mild-moderately dilated. Right atrial pressure is estimated at 10 mmHg.  Interatrial Septum: No atrial level shunt detected by color flow Doppler.  Pericardium: Trivial pericardial effusion is present.  Mitral Valve: The mitral valve is normal in structure. Mild calcification of the mitral valve leaflet. Mitral valve regurgitation is mild by color flow Doppler. No evidence of mitral valve stenosis.  Tricuspid Valve: The tricuspid valve was normal in structure. Tricuspid valve regurgitation is trivial by color flow Doppler.  Aortic Valve: The aortic valve has been repaired/replaced. A bioprosthetic aortic valve valve is present in the aortic position. There was a bioprosthetic aortic valve that appeared to function normally. No evidence for stenosis or significant  regurgitation.  Aorta: The aortic root and ascending aorta are normal in size and structure.  Patient Profile     85 y.o. male w/ PMHX of CAD (CABG and redo CABG, AAA repair, VHD w/ biorpopsthetic AVR, HTN, DM, HLD, chronic CHF (systolic), and paroxysmal AFib   Assessment & Plan    1. Paroxysmal AFib     CHA2DS2Vasc is 6, on warfarin     Tikosyn load in progress      K+ 4.1      Mag 2.2     Creat 1.14, stable     QT stable  INR 3.7, pharmacy managing  DCCV today  Anticipate discharge tomorrow       2. CAD     No anginal symptoms     Continue home meds  3. VHD     H/o AVR (bioprosthetic)  4. DM    Continue home meds  5. HTN  Looks OK, no changes today  5. Chronic CHF (systolic/diastolic)     Recovered LVEF by last echo     Trace edema     Does not appear overtly volume OL currently     Continue home meds  For questions or updates, please contact [place] Please consult [email or web address] for contact info under   Signed, Baldwin Jamaica, PA-C  06/14/2019, 8:00 AM    EP Attending  Patient seen and examined. Agree with above. We will plan to proceed with DCCV. His QT interval is stable.  Mikle Bosworth.D.

## 2019-06-14 NOTE — Progress Notes (Signed)
[place] for Warfarin Indication: atrial fibrillation  Patient Measurements: Height: 5\' 10"  (177.8 cm) Weight: 189 lb 6.4 oz (85.9 kg) IBW/kg (Calculated) : 73  Vital Signs: Temp: 97.6 F (36.4 C) (07/01 1148) Temp Source: Oral (07/01 1148) BP: 129/77 (07/01 1148) Pulse Rate: 68 (07/01 1148)  Labs: Recent Labs    06/12/19 1242 06/13/19 0429 06/14/19 0505  LABPROT 27.0* 30.4* 36.3*  INR 2.6* 3.0* 3.7*  CREATININE 1.19 1.06 1.14     Medical History: Past Medical History:  Diagnosis Date  . AAA (abdominal aortic aneurysm) ([place])   . Atrial fibrillation ([place])   . Atrial fibrillation ([place])   . CAD (coronary artery disease)   . Cataract   . CHF (congestive heart failure) ([place])   . Chronic kidney disease   . Colon polyps    adenomatous  . Diabetes mellitus   . Diverticulosis   . Dysrhythmia    A-Fib  . GERD (gastroesophageal reflux disease)   . Gout   . Hernia, abdominal   . History of aortic valve replacement with bioprosthetic valve    2007  . Hx: UTI (urinary tract infection)   . Hyperlipemia   . Hypertension   . Internal hemorrhoids   . Peripheral vascular disease ([place])   . Shortness of breath    with exertion   Assessment: CC/HPI: Tikosyn admission  PMH: TEE/CV 6/9.extensive vascular disease including CAD s/p CABG 2007 and redo CABG, AAA s/p repair 5/15, PAD, and carotid stenosis as well as prior AVR 2007 bioprosthetic, , cataracts, CHF, CKD, DM, diverticulosis, GERD, gout, abdominal hernia, HTN, HLD, hemorrhoids,    Anticoag: Warfarin PTA for afib. TEE on 6/11 without any evidence of left atrial thrombus, INR supra-therapeutic at 3.7  - PTA warfarin: 6mg  M/F and 9mg  all other days.  Goal of Therapy:  INR 2-3 Monitor platelets by anticoagulation protocol: Yes    Plan:  -Tikosyn 250 BID -Hold warfarin today -Daily INR   Tad Moore 06/14/2019 12:25 PM

## 2019-06-14 NOTE — TOC Initial Note (Signed)
Transition of Care (TOC) - Initial/Assessment Note    Patient Details  Name: Nathaniel [name]. MRN: [id number] Date of Birth: 1932/06/16  Transition of Care [facility]) CM/SW Contact:    Bethena Roys, RN Phone Number: 06/14/2019, 4:10 PM  Clinical Narrative:  Pt presented for Tikosyn Load. PTA independent from home. Patient will return home on Tikosyn. Benefits Check submitted for Tikosyn and patient made aware of cost. Rx for 7 day supply no refills to be sent to TOC-Pharmacy and patient wants Rx for 30 day supply with refills  to be sent to [facility]. No further needs from CM at this time.                   Expected Discharge Plan: Home/Self Care Barriers to Discharge: No Barriers Identified   Patient Goals and CMS Choice     Choice offered to / list presented to : NA  Expected Discharge Plan and Services Expected Discharge Plan: Home/Self Care In-house Referral: NA Discharge Planning Services: Medication Assistance(Tikosyn 7 day supply) Post Acute Care Choice: NA Living arrangements for the past 2 months: Single Family Home                           HH Arranged: NA          Prior Living Arrangements/Services Living arrangements for the past 2 months: Single Family Home Lives with:: Self Patient language and need for interpreter reviewed:: Yes Do you feel safe going back to the place where you live?: Yes      Need for Family Participation in Patient Care: Yes (Comment) Care giver support system in place?: Yes (comment)   Criminal Activity/Legal Involvement Pertinent to Current Situation/Hospitalization: No - Comment as needed  Activities of Daily Living Home Assistive Devices/Equipment: None ADL Screening (condition at time of admission) Patient's cognitive ability adequate to safely complete daily activities?: Yes Is the patient deaf or have difficulty hearing?: No Does the patient have difficulty seeing, even when wearing  glasses/contacts?: No Does the patient have difficulty concentrating, remembering, or making decisions?: Yes Patient able to express need for assistance with ADLs?: Yes Does the patient have difficulty dressing or bathing?: No Independently performs ADLs?: Yes (appropriate for developmental age) Does the patient have difficulty walking or climbing stairs?: No Weakness of Legs: None Weakness of Arms/Hands: None  Permission Sought/Granted Permission sought to share information with : Family Supports                Emotional Assessment Appearance:: Appears stated age Attitude/Demeanor/Rapport: Engaged Affect (typically observed): Accepting Orientation: : Oriented to Situation, Oriented to  Time, Oriented to Place, Oriented to Self Alcohol / Substance Use: Not Applicable Psych Involvement: No (comment)  Admission diagnosis:  A Fib tiksyon Patient Active Problem List   Diagnosis Date Noted  . Afib ([place]) 06/12/2019  . Asymptomatic carotid artery stenosis without infarction, right 02/02/2018  . Lung infiltrate 01/06/2018  . Acute bronchiolitis due to human metapneumovirus 01/06/2018  . Acute on chronic combined systolic and diastolic CHF (congestive heart failure) ([place])   . Cough 12/19/2017  . Acute bronchitis 12/19/2017  . Solitary pulmonary nodule 03/23/2016  . Pulmonary nodule 12/25/2015  . Aspiration of foreign body   . Lung nodule < 6cm on CT   . Foreign body aspiration 12/23/2015  . Encounter for therapeutic drug monitoring 03/28/2015  . Chronic systolic CHF (congestive heart failure) ([place]) 06/12/2014  . Chronic diastolic CHF (  congestive heart failure) ([place]) 04/23/2014  . Atrial fibrillation with rapid ventricular response ([place]) 04/20/2014  . AAA (abdominal aortic aneurysm) without rupture ([place]) 04/13/2014  . Occlusion and stenosis of carotid artery without mention of cerebral infarction 04/09/2014  . CAD (coronary artery disease) of artery bypass graft 09/14/2012  .  Abdominal aortic aneurysm ([place]) 09/14/2012  . Peripheral vascular disease with claudication ([place]) 09/14/2012  . Carotid artery disease without cerebral infarction ([place]) 09/14/2012  . History of aortic valve replacement with bioprosthetic valve 09/14/2012  . Essential hypertension, malignant 09/14/2012  . Hyperlipidemia with target LDL less than 70 09/14/2012  . ABDOMINAL AORTIC ANEURYSM 07/06/2009  . CEREBROVASCULAR DISEASE, HX OF 07/06/2009   PCP:  Lorne Skeens, MD Pharmacy:   [place], [place] - [address] [place] [address] Phone: [phone] Fax: [phone]  [facility] [phone] [place], [place] [address] Phone: [phone] Fax: [phone]     Social Determinants of Health (SDOH) Interventions    Readmission Risk Interventions No flowsheet data found.

## 2019-06-15 ENCOUNTER — Telehealth: Payer: Self-pay

## 2019-06-15 LAB — PROTIME-INR
INR: 3.7 — ABNORMAL HIGH (ref 0.8–1.2)
Prothrombin Time: 35.8 seconds — ABNORMAL HIGH (ref 11.4–15.2)

## 2019-06-15 LAB — MAGNESIUM: Magnesium: 2.1 mg/dL (ref 1.7–2.4)

## 2019-06-15 LAB — BASIC METABOLIC PANEL
Anion gap: 8 (ref 5–15)
BUN: 17 mg/dL (ref 8–23)
CO2: 28 mmol/L (ref 22–32)
Calcium: 9.1 mg/dL (ref 8.9–10.3)
Chloride: 102 mmol/L (ref 98–111)
Creatinine, Ser: 1.19 mg/dL (ref 0.61–1.24)
GFR calc Af Amer: >60 mL/min (ref >60)
GFR calc non Af Amer: 55 mL/min — ABNORMAL LOW (ref >60)
Glucose, Bld: 96 mg/dL (ref 70–99)
Potassium: 4.6 mmol/L (ref 3.5–5.1)
Sodium: 138 mmol/L (ref 135–145)

## 2019-06-15 LAB — GLUCOSE, CAPILLARY: Glucose-Capillary: 96 mg/dL (ref 70–99)

## 2019-06-15 MED ORDER — DOFETILIDE 250 MCG PO CAPS: 250.0000 ug | ORAL_CAPSULE | Freq: Two times a day (BID) | ORAL | 6 refills | Status: DC

## 2019-06-15 MED ORDER — DOFETILIDE 250 MCG PO CAPS: 250.0000 ug | ORAL_CAPSULE | Freq: Two times a day (BID) | ORAL | 0 refills | Status: DC

## 2019-06-15 MED FILL — TIKOSYN 250 MCG CAPS: 250 | 7 days supply | Qty: 14 | Fill #0

## 2019-06-15 MED FILL — DOFETILIDE 250 MCG CAPS: 250 | 30 days supply | Qty: 60 | Fill #0

## 2019-06-15 NOTE — Discharge Instructions (Signed)
Information on my medicine - Coumadin   (Warfarin)  Why was Coumadin prescribed for you? Coumadin was prescribed for you because you have a blood clot or a medical condition that can cause an increased risk of forming blood clots. Blood clots can cause serious health problems by blocking the flow of blood to the heart, lung, or brain. Coumadin can prevent harmful blood clots from forming. As a reminder your indication for Coumadin is:   Stroke Prevention Because Of Atrial Fibrillation  What test will check on my response to Coumadin? While on Coumadin (warfarin) you will need to have an INR test regularly to ensure that your dose is keeping you in the desired range. The INR (international normalized ratio) number is calculated from the result of the laboratory test called prothrombin time (PT).  If an INR APPOINTMENT HAS NOT ALREADY BEEN MADE FOR YOU please schedule an appointment to have this lab work done by your health care provider within 7 days. Your INR goal is usually a number between:  2 to 3 or your provider may give you a more narrow range like 2-2.5.  Ask your health care provider during an office visit what your goal INR is.  What  do you need to  know  About  COUMADIN? Take Coumadin (warfarin) exactly as prescribed by your healthcare provider about the same time each day.  DO NOT stop taking without talking to the doctor who prescribed the medication.  Stopping without other blood clot prevention medication to take the place of Coumadin may increase your risk of developing a new clot or stroke.  Get refills before you run out.  What do you do if you miss a dose? If you miss a dose, take it as soon as you remember on the same day then continue your regularly scheduled regimen the next day.  Do not take two doses of Coumadin at the same time.  Important Safety Information A possible side effect of Coumadin (Warfarin) is an increased risk of bleeding. You should call your healthcare  provider right away if you experience any of the following: ? Bleeding from an injury or your nose that does not stop. ? Unusual colored urine (red or dark brown) or unusual colored stools (red or black). ? Unusual bruising for unknown reasons. ? A serious fall or if you hit your head (even if there is no bleeding).  Some foods or medicines interact with Coumadin (warfarin) and might alter your response to warfarin. To help avoid this: ? Eat a balanced diet, maintaining a consistent amount of Vitamin K. ? Notify your provider about major diet changes you plan to make. ? Avoid alcohol or limit your intake to 1 drink for women and 2 drinks for men per day. (1 drink is 5 oz. wine, 12 oz. beer, or 1.5 oz. liquor.)  Make sure that ANY health care provider who prescribes medication for you knows that you are taking Coumadin (warfarin).  Also make sure the healthcare provider who is monitoring your Coumadin knows when you have started a new medication including herbals and non-prescription products.  Coumadin (Warfarin)  Major Drug Interactions  Increased Warfarin Effect Decreased Warfarin Effect  Alcohol (large quantities) Antibiotics (esp. Septra/Bactrim, Flagyl, Cipro) Amiodarone (Cordarone) Aspirin (ASA) Cimetidine (Tagamet) Megestrol (Megace) NSAIDs (ibuprofen, naproxen, etc.) Piroxicam (Feldene) Propafenone (Rythmol SR) Propranolol (Inderal) Isoniazid (INH) Posaconazole (Noxafil) Barbiturates (Phenobarbital) Carbamazepine (Tegretol) Chlordiazepoxide (Librium) Cholestyramine (Questran) Griseofulvin Oral Contraceptives Rifampin Sucralfate (Carafate) Vitamin K   Coumadin (Warfarin) Major Herbal  Interactions  Increased Warfarin Effect Decreased Warfarin Effect  Garlic Ginseng Ginkgo biloba Coenzyme Q10 Green tea St. Johns wort    Coumadin (Warfarin) FOOD Interactions  Eat a consistent number of servings per week of foods HIGH in Vitamin K (1 serving =  cup)  Collards  (cooked, or boiled & drained) Kale (cooked, or boiled & drained) Mustard greens (cooked, or boiled & drained) Parsley *serving size only =  cup Spinach (cooked, or boiled & drained) Swiss chard (cooked, or boiled & drained) Turnip greens (cooked, or boiled & drained)  Eat a consistent number of servings per week of foods MEDIUM-HIGH in Vitamin K (1 serving = 1 cup)  Asparagus (cooked, or boiled & drained) Broccoli (cooked, boiled & drained, or raw & chopped) Brussel sprouts (cooked, or boiled & drained) *serving size only =  cup Lettuce, raw (green leaf, endive, romaine) Spinach, raw Turnip greens, raw & chopped   These websites have more information on Coumadin (warfarin):  FailFactory.se; VeganReport.com.au;  Dofetilide capsules What is this medicine? DOFETILIDE (doe FET il ide) is an antiarrhythmic drug. It helps make your heart beat regularly. This medicine also helps to slow rapid heartbeats. This medicine may be used for other purposes; ask your health care provider or pharmacist if you have questions. COMMON BRAND NAME(S): Tikosyn What should I tell my health care provider before I take this medicine? They need to know if you have any of these conditions:  heart disease  history of irregular heartbeat  history of low levels of potassium or magnesium in the blood  kidney disease  liver disease  an unusual or allergic reaction to dofetilide, other medicines, foods, dyes, or preservatives  pregnant or trying to get pregnant  breast-feeding How should I use this medicine? Take this medicine by mouth with a glass of water. Follow the directions on the prescription label. Do not take with grapefruit juice. You can take it with or without food. If it upsets your stomach, take it with food. Take your medicine at regular intervals. Do not take it more often than directed. Do not stop taking except on your doctor's advice. A special MedGuide will be given  to you by the pharmacist with each prescription and refill. Be sure to read this information carefully each time. Talk to your pediatrician regarding the use of this medicine in children. Special care may be needed. Overdosage: If you think you have taken too much of this medicine contact a poison control center or emergency room at once. NOTE: This medicine is only for you. Do not share this medicine with others. What if I miss a dose? If you miss a dose, skip it. Take your next dose at the normal time. Do not take extra or 2 doses at the same time to make up for the missed dose. What may interact with this medicine? Do not take this medicine with any of the following medications:  cimetidine  cisapride  dolutegravir  dronedarone  hydrochlorothiazide  ketoconazole  megestrol  pimozide  prochlorperazine  thioridazine  trimethoprim  verapamil This medicine may also interact with the following medications:  amiloride  cannabinoids  certain antibiotics like erythromycin or clarithromycin  certain antiviral medicines for HIV or hepatitis  certain medicines for depression, anxiety, or psychotic disorders  digoxin  diltiazem  grapefruit juice  metformin  nefazodone  other medicines that prolong the QT interval (an abnormal heart rhythm)  quinine  triamterene  zafirlukast  ziprasidone This list may not describe all possible  interactions. Give your health care provider a list of all the medicines, herbs, non-prescription drugs, or dietary supplements you use. Also tell them if you smoke, drink alcohol, or use illegal drugs. Some items may interact with your medicine. What should I watch for while using this medicine? Your condition will be monitored carefully while you are receiving this medicine. What side effects may I notice from receiving this medicine? Side effects that you should report to your doctor or health care professional as soon as  possible:  allergic reactions like skin rash, itching or hives, swelling of the face, lips, or tongue  breathing problems  chest pain or chest tightness  dizziness  signs and symptoms of a dangerous change in heartbeat or heart rhythm like chest pain; dizziness; fast or irregular heartbeat; palpitations; feeling faint or lightheaded, falls; breathing problems  signs and symptoms of electrolyte imbalance like severe diarrhea, unusual sweating, vomiting, loss of appetite, increased thirst  swelling of the ankles, legs, or feet  tingling, numbness in the hands or feet Side effects that usually do not require medical attention (report to your doctor or health care professional if they continue or are bothersome):  diarrhea  general ill feeling or flu-like symptoms  headache  nausea  trouble sleeping  stomach pain This list may not describe all possible side effects. Call your doctor for medical advice about side effects. You may report side effects to FDA at 1-800-FDA-1088. Where should I keep my medicine? Keep out of the reach of children. Store at room temperature between 15 and 30 degrees C (59 and 86 degrees F). Throw away any unused medicine after the expiration date. NOTE: This sheet is a summary. It may not cover all possible information. If you have questions about this medicine, talk to your doctor, pharmacist, or health care provider.  2020 Elsevier/Gold Standard (2018-11-21 10:18:48)

## 2019-06-15 NOTE — Care Management Important Message (Signed)
Important Message  Patient Details  Name: Nathaniel [name]. MRN: [id number] Date of Birth: [birth year]   Medicare Important Message Given:  Yes     Shelda Altes 06/15/2019, 2:33 PM

## 2019-06-15 NOTE — Telephone Encounter (Signed)
lmom for prescreen  

## 2019-06-15 NOTE — Anesthesia Postprocedure Evaluation (Signed)
Anesthesia Post Note  Patient: Nathaniel [name].  Procedure(s) Performed: CARDIOVERSION (N/A )     Patient location during evaluation: Endoscopy Anesthesia Type: General Level of consciousness: awake and alert Pain management: pain level controlled Vital Signs Assessment: post-procedure vital signs reviewed and stable Cardiovascular status: stable Postop Assessment: no apparent nausea or vomiting Anesthetic complications: no    Last Vitals:  Vitals:   06/14/19 2106 06/15/19 0600  BP: 92/76 114/65  Pulse: 68 68  Resp: 18 19  Temp: 36.4 C 36.4 C  SpO2: 97% 94%    Last Pain:  Vitals:   06/15/19 0600  TempSrc: Oral  PainSc:                  Abdallah Hern

## 2019-06-15 NOTE — Progress Notes (Signed)
[place] for Warfarin Indication: atrial fibrillation  Patient Measurements: Height: 5\' 10"  (177.8 cm) Weight: 190 lb 3.2 oz (86.3 kg) IBW/kg (Calculated) : 73  Vital Signs: Temp: 97.6 F (36.4 C) (07/02 0600) Temp Source: Oral (07/02 0600) BP: 156/57 (07/02 1022) Pulse Rate: 68 (07/02 0600)  Labs: Recent Labs    06/13/19 0429 06/14/19 0505 06/15/19 0345  LABPROT 30.4* 36.3* 35.8*  INR 3.0* 3.7* 3.7*  CREATININE 1.06 1.14 1.19     Medical History: Past Medical History:  Diagnosis Date  . AAA (abdominal aortic aneurysm) ([place])   . Atrial fibrillation ([place])   . Atrial fibrillation ([place])   . CAD (coronary artery disease)   . Cataract   . CHF (congestive heart failure) ([place])   . Chronic kidney disease   . Colon polyps    adenomatous  . Diabetes mellitus   . Diverticulosis   . Dysrhythmia    A-Fib  . GERD (gastroesophageal reflux disease)   . Gout   . Hernia, abdominal   . History of aortic valve replacement with bioprosthetic valve    2007  . Hx: UTI (urinary tract infection)   . Hyperlipemia   . Hypertension   . Internal hemorrhoids   . Peripheral vascular disease ([place])   . Shortness of breath    with exertion   Assessment: 58 yoM admitted for dofetilide initiation now in NSR s/p DCCV 7/1. INR remains supratherapeutic, likely to discontinue today.   **PTA warfarin: 6mg  M/F and 9mg  all other days.  Goal of Therapy:  INR 2-3 Monitor platelets by anticoagulation protocol: Yes    Plan:  -Hold warfarin again tonight -Daily protime -If discharged, recommend restarting warfarin tomorrow (Friday 7/3) at prior home regimen listed above with close INR follow-up next week   Arrie Senate, PharmD, BCPS Clinical Pharmacist [phone] Please check AMION for all [facility] numbers 06/15/2019

## 2019-06-15 NOTE — Discharge Summary (Addendum)
ELECTROPHYSIOLOGY PROCEDURE DISCHARGE SUMMARY    Patient ID: Nathaniel [name].,  MRN: [id number], DOB/AGE: Aug 24, 1932 85 y.o.  Admit date: 06/12/2019 Discharge date: 06/15/2019  Primary Care Physician: Lorne Skeens, MD  Primary Cardiologist: Dr. Aundra Dubin Electrophysiologist: new, Dr. Lovena Le  Primary Discharge Diagnosis:  1.  Paroxysmal atrial fibrillation status post Tikosyn loading this admission      CHA2DS2Vasc is 6, on Warfarin  Secondary Discharge Diagnosis:  1. CAD 2. VHD     H/o Bioprosthetic AVR 3. DM 4. HTN 5. Chronic combined CHF     Compensated currenty  Allergies  Allergen Reactions  . Grass Extracts [Gramineae Pollens] Other (See Comments)    Sneezing and runny nose  . Milk-Related Compounds Other (See Comments)    Just Yogurt.  Makes him very lethargic.  Marland Kitchen Amiodarone Anxiety    depression     Procedures This Admission:  1.  Tikosyn loading 2.  Direct current cardioversion on 06/14/2019 by Dr Marlou Porch which successfully restored SR.  There were no early apparent complications.   Brief HPI: [name]. is a 85 y.o. male with a past medical history as noted above.  He was  referred to the AFib in the outpatient setting for treatment options of atrial fibrillation.  Risks, benefits, and alternatives to Tikosyn were reviewed with the patient who wished to proceed.    Hospital Course:  The patient was admitted and Tikosyn was initiated.  Renal function and electrolytes were followed during the hospitalization.  His QTc remained stable.  On 06/14/2019 they underwent direct current cardioversion which restored sinus rhythm.  He was monitored until discharge on telemetry which demonstrated SR, infrequent PVCs.  On the day of discharge, the patient was examined by Dr Lovena Le who considered them stable for discharge to home.  Follow-up has been arranged with the AFib clinic and coumadin clinic in 1 week and with Dr Lovena Le in 4 weeks.   Physical Exam: Vitals:   06/14/19 1800 06/14/19 2106 06/15/19 0600 06/15/19 1022  BP: 108/65 92/76 114/65 (!) 156/57  Pulse:  68 68   Resp:  18 19   Temp:  97.6 F (36.4 C) 97.6 F (36.4 C)   TempSrc:   Oral   SpO2:  97% 94%   Weight:   86.3 kg   Height:        GEN- The patient is well appearing, alert and oriented x 3 today.   HEENT: normocephalic, atraumatic; sclera clear, conjunctiva pink; hearing intact; oropharynx clear; neck supple, no JVP Lymph- no cervical lymphadenopathy Lungs- CTA b/l, normal work of breathing.  No wheezes, rales, rhonchi Heart- RRR, no murmurs, rubs or gallops, PMI not laterally displaced GI- soft, non-tender, non-distended Extremities- no clubbing, cyanosis, trace edema MS- no significant deformity or atrophy Skin- warm and dry, no rash or lesion Psych- euthymic mood, full affect Neuro- strength and sensation are intact   Labs:   Lab Results  Component Value Date   WBC 6.8 05/23/2019   HGB 14.8 05/23/2019   HCT 45.9 05/23/2019   MCV 89.6 05/23/2019   PLT 185 05/23/2019    Recent Labs  Lab 06/15/19 0345  NA 138  K 4.6  CL 102  CO2 28  BUN 17  CREATININE 1.19  CALCIUM 9.1  GLUCOSE 96     Discharge Medications:  Allergies as of 06/15/2019      Reactions   Grass Extracts [gramineae Pollens] Other (See Comments)   Sneezing and runny nose  Milk-related Compounds Other (See Comments)   Just Yogurt.  Makes him very lethargic.   Amiodarone Anxiety   depression      Medication List    TAKE these medications   allopurinol 300 MG tablet Commonly known as: ZYLOPRIM Take 300 mg by mouth daily. Notes to patient: TOMORROW   atorvastatin 10 MG tablet Commonly known as: LIPITOR TAKE ONE TABLET BY MOUTH ONCE DAILY What changed:   when to take this  additional instructions   atorvastatin 20 MG tablet Commonly known as: LIPITOR TAKE ONE TABLET EVERY OTHER DAY What changed:   how much to take  how to take this  when to take this  additional  instructions   Co Q10 100 MG Caps Take 100 mg by mouth daily.   dofetilide 250 MCG capsule Commonly known as: TIKOSYN Take 1 capsule (250 mcg total) by mouth 2 (two) times daily.   doxazosin 2 MG tablet Commonly known as: CARDURA Take 2 mg by mouth every morning. Also take 1 mg tablet every night   doxazosin 1 MG tablet Commonly known as: CARDURA Take 1 mg by mouth at bedtime. Also take 2 mg tablet every morning   ezetimibe 10 MG tablet Commonly known as: ZETIA Take 10 mg by mouth at bedtime.   finasteride 5 MG tablet Commonly known as: PROSCAR Take 1 tablet (5 mg total) by mouth at bedtime.   furosemide 40 MG tablet Commonly known as: LASIX Take 40 mg by mouth See admin instructions. Take one tablet (40 mg) by mouth instead of 20 mg tablet every few days. What changed: Another medication with the same name was changed. Make sure you understand how and when to take each.   furosemide 20 MG tablet Commonly known as: LASIX Take 2 tablets (40 mg total) by mouth daily. What changed:   how much to take  when to take this  additional instructions   GLUCOSAMINE-MSM PO Take 1 tablet by mouth daily.   Jardiance 10 MG Tabs tablet Generic drug: empagliflozin Take 10 mg by mouth at bedtime.   latanoprost 0.005 % ophthalmic solution Commonly known as: XALATAN Place 1 drop into both eyes at bedtime.   loratadine-pseudoephedrine 10-240 MG 24 hr tablet Commonly known as: [name] 24-hour Take 1 tablet by mouth daily as needed (seasonal allergies).   OCUVITE EXTRA PO Take 1 tablet by mouth daily.   ramipril 2.5 MG capsule Commonly known as: ALTACE Take 2.5 mg by mouth at bedtime. Also take 5 mg capsule every morning What changed: Another medication with the same name was changed. Make sure you understand how and when to take each.   ramipril 5 MG capsule Commonly known as: ALTACE TAKE ONE CAPSULE EACH DAY What changed: See the new instructions.   Repatha  SureClick 035 MG/ML Soaj Generic drug: Evolocumab Inject 1 pen into the skin every 14 (fourteen) days. What changed: how much to take   spironolactone 25 MG tablet Commonly known as: ALDACTONE Take 1 tablet (25 mg total) by mouth at bedtime.   tamsulosin 0.4 MG Caps capsule Commonly known as: FLOMAX Take 0.4 mg by mouth See admin instructions. Take one capsule (0.4 mg) in the morning, may take a second 0.4 mg at night as needed for urinary retention   timolol 0.5 % ophthalmic solution Commonly known as: TIMOPTIC Place 1 drop into both eyes daily.   warfarin 3 MG tablet Commonly known as: COUMADIN Take as directed. If you are unsure how to take this medication,  talk to your nurse or doctor. Original instructions: TAKE 2-3 TABLETS DAILY AS DIRECTED BY COUMADIN CLINIC What changed:   how much to take  how to take this  when to take this  additional instructions Notes to patient: Do not take any warfarin tonight, resume tomorrow at  Your usual schedule       Disposition:  Home Discharge Instructions    Diet - low sodium heart healthy   Complete by: As directed    Increase activity slowly   Complete by: As directed      Follow-up Information    [facility].   Specialty: Cardiology Why: 06/22/2019 @ 10:00AM Contact information: [address] [id number] [place] [phone]       [facility].   Specialty: Cardiology Why: 06/22/2019 @ 10:30AM Contact information: [address], [address] [place]       Evans Lance, MD Follow up.   Specialty: Cardiology Why: 07/19/2019 @ 10:00AM Contact information: [address] [address] [phone]           Duration of Discharge Encounter: Greater than 30 minutes including physician time.  Venetia Night, PA-C 06/15/2019 1:43 PM    EP Attending  Patient seen and examined. Agree with above. The patient is maintaining NSR and his QT interval is acceptable. He has had no significant arrhythmias. He will be discharged home on dofetilide with followup as outlined above.  Mikle Bosworth.D.

## 2019-06-22 ENCOUNTER — Other Ambulatory Visit: Payer: Self-pay

## 2019-06-22 ENCOUNTER — Other Ambulatory Visit (HOSPITAL_COMMUNITY): Payer: Self-pay | Admitting: *Deleted

## 2019-06-22 ENCOUNTER — Ambulatory Visit (HOSPITAL_COMMUNITY)
Admit: 2019-06-22 | Discharge: 2019-06-22 | Disposition: A | Discharge status: Home / Self Care | Payer: Medicare Other | Source: Ambulatory Visit | Attending: Nurse Practitioner | Admitting: Nurse Practitioner

## 2019-06-22 ENCOUNTER — Encounter (HOSPITAL_COMMUNITY): Payer: Self-pay | Admitting: Nurse Practitioner

## 2019-06-22 VITALS — BP 110/66 | HR 62 | Ht 70.0 in | Wt 192.0 lb

## 2019-06-22 DIAGNOSIS — I130 Hypertensive heart and chronic kidney disease with heart failure and stage 1 through stage 4 chronic kidney disease, or unspecified chronic kidney disease: Secondary | ICD-10-CM | POA: Diagnosis not present

## 2019-06-22 DIAGNOSIS — Z8249 Family history of ischemic heart disease and other diseases of the circulatory system: Secondary | ICD-10-CM | POA: Insufficient documentation

## 2019-06-22 DIAGNOSIS — E785 Hyperlipidemia, unspecified: Secondary | ICD-10-CM | POA: Insufficient documentation

## 2019-06-22 DIAGNOSIS — I2510 Atherosclerotic heart disease of native coronary artery without angina pectoris: Secondary | ICD-10-CM | POA: Insufficient documentation

## 2019-06-22 DIAGNOSIS — Z87891 Personal history of nicotine dependence: Secondary | ICD-10-CM | POA: Diagnosis not present

## 2019-06-22 DIAGNOSIS — Z7901 Long term (current) use of anticoagulants: Secondary | ICD-10-CM | POA: Diagnosis not present

## 2019-06-22 DIAGNOSIS — Z951 Presence of aortocoronary bypass graft: Secondary | ICD-10-CM | POA: Diagnosis not present

## 2019-06-22 DIAGNOSIS — Z888 Allergy status to other drugs, medicaments and biological substances status: Secondary | ICD-10-CM | POA: Diagnosis not present

## 2019-06-22 DIAGNOSIS — N189 Chronic kidney disease, unspecified: Secondary | ICD-10-CM | POA: Diagnosis not present

## 2019-06-22 DIAGNOSIS — I480 Paroxysmal atrial fibrillation: Secondary | ICD-10-CM | POA: Diagnosis not present

## 2019-06-22 DIAGNOSIS — Z953 Presence of xenogenic heart valve: Secondary | ICD-10-CM | POA: Diagnosis not present

## 2019-06-22 DIAGNOSIS — K219 Gastro-esophageal reflux disease without esophagitis: Secondary | ICD-10-CM | POA: Insufficient documentation

## 2019-06-22 DIAGNOSIS — I4819 Other persistent atrial fibrillation: Secondary | ICD-10-CM

## 2019-06-22 DIAGNOSIS — Z7984 Long term (current) use of oral hypoglycemic drugs: Secondary | ICD-10-CM | POA: Diagnosis not present

## 2019-06-22 DIAGNOSIS — Z79899 Other long term (current) drug therapy: Secondary | ICD-10-CM | POA: Insufficient documentation

## 2019-06-22 DIAGNOSIS — Z87440 Personal history of urinary (tract) infections: Secondary | ICD-10-CM | POA: Insufficient documentation

## 2019-06-22 DIAGNOSIS — E1122 Type 2 diabetes mellitus with diabetic chronic kidney disease: Secondary | ICD-10-CM | POA: Insufficient documentation

## 2019-06-22 DIAGNOSIS — I4891 Unspecified atrial fibrillation: Secondary | ICD-10-CM | POA: Diagnosis present

## 2019-06-22 DIAGNOSIS — I509 Heart failure, unspecified: Secondary | ICD-10-CM | POA: Insufficient documentation

## 2019-06-22 DIAGNOSIS — E1151 Type 2 diabetes mellitus with diabetic peripheral angiopathy without gangrene: Secondary | ICD-10-CM | POA: Diagnosis not present

## 2019-06-22 LAB — BASIC METABOLIC PANEL
Anion gap: 8 (ref 5–15)
BUN: 12 mg/dL (ref 8–23)
CO2: 25 mmol/L (ref 22–32)
Calcium: 9 mg/dL (ref 8.9–10.3)
Chloride: 102 mmol/L (ref 98–111)
Creatinine, Ser: 1.07 mg/dL (ref 0.61–1.24)
GFR calc Af Amer: >60 mL/min (ref >60)
GFR calc non Af Amer: >60 mL/min (ref >60)
Glucose, Bld: 153 mg/dL — ABNORMAL HIGH (ref 70–99)
Potassium: 4.3 mmol/L (ref 3.5–5.1)
Sodium: 135 mmol/L (ref 135–145)

## 2019-06-22 LAB — MAGNESIUM: Magnesium: 1.9 mg/dL (ref 1.7–2.4)

## 2019-06-22 MED ORDER — DOFETILIDE 250 MCG PO CAPS: 250.0000 ug | ORAL_CAPSULE | Freq: Two times a day (BID) | ORAL | 6 refills | Status: DC

## 2019-06-22 NOTE — Progress Notes (Signed)
Primary Care Physician: [name], Nathaniel Como, MD Referring Physician: Dr. Zachery Conch Emerson [name]. is a 85 y.o. male with  extensive vascular disease including CAD s/p CABG and redo CABG, AAA s/p repair, PAD, carotid stenosis as  prior AVR , as well as h/o paroxysmal afib, that is in the afib clinic for consideration for Tikosyn admit. He was seen by Dr. Aundra [name] last week  and found to have ERAF after successful TEE/DCCV  6/11. Pt does  have RVR and has not tolerated BB's  for extreme fatigue or amiodarone in the remote past for suicidal ideations. Dr. 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   F/u in afib clinic, 7/9.He did return to SR with dofetilide and DCCV. He feels much improved. His qtc is stable.   Today, he denies symptoms of palpitations, chest [name], shortness of breath, orthopnea, PND, lower extremity edema, dizziness, presyncope, syncope, or neurologic sequela. The patient is tolerating medications without difficulties and is otherwise without complaint today.   Past Medical History:  Diagnosis Date  . AAA (abdominal aortic aneurysm) ([name])   . Atrial fibrillation ([name])   . Atrial fibrillation ([name])   . CAD (coronary artery disease)   . Cataract   . CHF (congestive heart failure) ([name])   . Chronic kidney disease   . Colon polyps    adenomatous  . Diabetes mellitus   . Diverticulosis   . Dysrhythmia    A-Fib  . GERD (gastroesophageal reflux disease)   . Gout   . Hernia, abdominal   . History of aortic valve replacement with bioprosthetic valve    2007  . Hx: UTI (urinary tract infection)   . Hyperlipemia   . Hypertension   . Internal hemorrhoids   . Peripheral vascular disease ([name])   . Shortness of breath    with exertion   Past Surgical History:  Procedure Laterality Date  . ABDOMINAL AORTIC ENDOVASCULAR STENT  GRAFT N/A 04/13/2014   Procedure: ABDOMINAL AORTIC ENDOVASCULAR STENT GRAFT;  Surgeon: Nathaniel Mitchell, MD;  Location: [facility];  Service: Vascular;  Laterality: N/A;  . ANGIOPLASTY     Unsure if he had stents put in   . APPENDECTOMY    . CARDIAC VALVE SURGERY    . CARDIOVERSION N/A 06/06/2014   Procedure: CARDIOVERSION;  Surgeon: Nathaniel Dresser, MD;  Location: [facility] ENDOSCOPY;  Service: Cardiovascular;  Laterality: N/A;  . CARDIOVERSION N/A 08/21/2014   Procedure: CARDIOVERSION;  Surgeon: Nathaniel Dresser, MD;  Location: [facility] ENDOSCOPY;  Service: Cardiovascular;  Laterality: N/A;  . CARDIOVERSION N/A 12/22/2017   Procedure: CARDIOVERSION;  Surgeon: Nathaniel Dresser, MD;  Location: [facility] ENDOSCOPY;  Service: Cardiovascular;  Laterality: N/A;  . CARDIOVERSION N/A 05/25/2019   Procedure: CARDIOVERSION;  Surgeon: Nathaniel Dresser, MD;  Location: [facility] ENDOSCOPY;  Service: Cardiovascular;  Laterality: N/A;  . CARDIOVERSION N/A 06/14/2019   Procedure: CARDIOVERSION;  Surgeon: Nathaniel Pain, MD;  Location: [facility];  Service: Cardiovascular;  Laterality: N/A;  . CORONARY ARTERY BYPASS GRAFT    . ENDARTERECTOMY Right 02/02/2018   Procedure: ENDARTERECTOMY CAROTID RIGHT;  Surgeon: Nathaniel Mitchell, MD;  Location: [facility];  Service: Vascular;  Laterality: Right;  . EYE SURGERY Bilateral    cataract surgery  . HERNIA REPAIR     2 operations  .  LEFT HEART CATHETERIZATION WITH CORONARY/GRAFT ANGIOGRAM N/A 07/10/2014   Procedure: LEFT HEART CATHETERIZATION WITH Nathaniel [name];  Surgeon: Nathaniel Dresser, MD;  Location: [facility] CATH LAB;  Service: Cardiovascular;  Laterality: N/A;  . TEE WITHOUT CARDIOVERSION N/A 05/25/2019   Procedure: TRANSESOPHAGEAL ECHOCARDIOGRAM (TEE);  Surgeon: Nathaniel Dresser, MD;  Location: [facility] ENDOSCOPY;  Service: Cardiovascular;  Laterality: N/A;  . TONSILLECTOMY    . VIDEO BRONCHOSCOPY Bilateral 12/25/2015   Procedure: VIDEO BRONCHOSCOPY WITHOUT FLUORO;  Surgeon: Nathaniel Glazier, MD;   Location: [facility];  Service: Cardiopulmonary;  Laterality: Bilateral;    Current Outpatient Medications  Medication Sig Dispense Refill  . allopurinol (ZYLOPRIM) 300 MG tablet Take 300 mg by mouth daily.   2  . atorvastatin (LIPITOR) 10 MG tablet TAKE ONE TABLET BY MOUTH ONCE DAILY (Patient taking differently: Take 10 mg by mouth every other day. Also take 20 mg tablet every night) 30 tablet 10  . atorvastatin (LIPITOR) 20 MG tablet TAKE ONE TABLET EVERY OTHER DAY (Patient taking differently: Take 20 mg by mouth at bedtime. Also take 10 mg tablet every other morning) 30 tablet 0  . Coenzyme Q10 (CO Q10) 100 MG CAPS Take 100 mg by mouth daily.    Marland Kitchen doxazosin (CARDURA) 1 MG tablet Take 1 mg by mouth at bedtime. Also take 2 mg tablet every morning    . doxazosin (CARDURA) 2 MG tablet Take 2 mg by mouth every morning. Also take 1 mg tablet every night    . empagliflozin (JARDIANCE) 10 MG TABS tablet Take 10 mg by mouth at bedtime.     Marland Kitchen ezetimibe (ZETIA) 10 MG tablet Take 10 mg by mouth at bedtime.     . finasteride (PROSCAR) 5 MG tablet Take 1 tablet (5 mg total) by mouth at bedtime. 90 tablet 3  . furosemide (LASIX) 20 MG tablet Take 2 tablets (40 mg total) by mouth daily. (Patient taking differently: Take 20 mg by mouth See admin instructions. Take one tablet (20 mg) by mouth daily, replace with 40 mg tablet every few days.) 60 tablet 3  . furosemide (LASIX) 40 MG tablet Take 40 mg by mouth See admin instructions. Take one tablet (40 mg) by mouth instead of 20 mg tablet every few days.    . Glucosamine HCl-MSM (GLUCOSAMINE-MSM PO) Take 1 tablet by mouth daily.    Marland Kitchen latanoprost (XALATAN) 0.005 % ophthalmic solution Place 1 drop into both eyes at bedtime.     Marland Kitchen loratadine-pseudoephedrine ([name] 24-HOUR) 10-240 MG 24 hr tablet Take 1 tablet by mouth daily as needed (seasonal allergies).     . Multiple Vitamins-Minerals (OCUVITE EXTRA PO) Take 1 tablet by mouth daily.    . ramipril (ALTACE)  2.5 MG capsule Take 2.5 mg by mouth at bedtime. Also take 5 mg capsule every morning    . ramipril (ALTACE) 5 MG capsule TAKE ONE CAPSULE EACH DAY (Patient taking differently: Take 5 mg by mouth every morning. Also take 2.5 mg capsule every night) 90 capsule 3  . spironolactone (ALDACTONE) 25 MG tablet Take 1 tablet (25 mg total) by mouth at bedtime. 90 tablet   . tamsulosin (FLOMAX) 0.4 MG CAPS capsule Take 0.4 mg by mouth See admin instructions. Take one capsule (0.4 mg) in the morning, may take a second 0.4 mg at night as needed for urinary retention    . timolol (TIMOPTIC) 0.5 % ophthalmic solution Place 1 drop into both eyes daily.   5  . warfarin (COUMADIN) 3 MG  tablet TAKE 2-3 TABLETS DAILY AS DIRECTED BY COUMADIN CLINIC (Patient taking differently: Take 6-9 mg by mouth See admin instructions. Take 2 tablets (6 mg) by mouth on Monday and Friday evening, take 3 tablets (9 mg) on Sunday, Tuesday, Wednesday, Thursday, Saturday evening.) 80 tablet 1  . dofetilide (TIKOSYN) 250 MCG capsule Take 1 capsule (250 mcg total) by mouth 2 (two) times daily. 60 capsule 6  . Evolocumab (REPATHA SURECLICK) 440 MG/ML SOAJ Inject 1 pen into the skin every 14 (fourteen) days. (Patient not taking: Reported on 06/22/2019) 2 pen 11   No current facility-administered medications for this encounter.     Allergies  Allergen Reactions  . Grass Extracts [Gramineae Pollens] Other (See Comments)    Sneezing and runny nose  . Milk-Related Compounds Other (See Comments)    Just Yogurt.  Makes him very lethargic.  Marland Kitchen Amiodarone Anxiety    depression    Social History   Socioeconomic History  . Marital status: Widowed    Spouse name: Not on file  . Number of children: Not on file  . Years of education: Not on file  . Highest education level: Not on file  Occupational History  . 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 Financial resource strain: Not on file  . Food insecurity    Worry: Not on file     Inability: Not on file  . Transportation needs    Medical: Not on file    Non-medical: Not on file  Tobacco Use  . Smoking status: Former Smoker    Packs/day: 0.25    Years: 20.00    Pack years: 5.00    Types: Cigarettes    Quit date: 03/23/1992    Years since quitting: 27.2  . Smokeless tobacco: Never Used  Substance and Sexual Activity  . Alcohol use: Yes    Alcohol/week: 10.0 standard drinks    Types: 10 Glasses of wine per week    Comment: 1-2 drinks daily   . Drug use: No  . Sexual activity: Not on file  Lifestyle  . Physical activity    Days per week: Not on file    Minutes per session: Not on file  . Stress: Not on file  Relationships  . Social Herbalist on phone: Not on file    Gets together: Not on file    Attends religious service: Not on file    Active member of club or organization: Not on file    Attends meetings of clubs or organizations: Not on file    Relationship status: Not on file  . Intimate partner violence    Fear of current or ex partner: Not on file    Emotionally abused: Not on file    Physically abused: Not on file    Forced sexual activity: Not on file  Other Topics Concern  . Not on file  Social History Narrative   1 caffeine drink daily     Family History  Problem Relation Age of Onset  . Heart disease Mother   . Heart disease Father        before age 61  . AAA (abdominal aortic aneurysm) Father   . Colon cancer Neg Hx     ROS- All systems are reviewed and negative except as per the HPI above  Physical Exam: Vitals:   06/22/19 1058  BP: 110/66  Pulse: 62  Weight: 87.1 kg  Height: 5\' 10"  (1.778 m)   Wt  Readings from Last 3 Encounters:  06/22/19 87.1 kg  06/15/19 86.3 kg  06/12/19 87.5 kg    Labs: Lab Results  Component Value Date   NA 135 06/22/2019   K 4.3 06/22/2019   CL 102 06/22/2019   CO2 25 06/22/2019   GLUCOSE 153 (H) 06/22/2019   BUN 12 06/22/2019   CREATININE 1.07 06/22/2019   CALCIUM 9.0  06/22/2019   PHOS 3.2 12/22/2017   MG 1.9 06/22/2019   Lab Results  Component Value Date   INR 3.7 (H) 06/15/2019   Lab Results  Component Value Date   CHOL 96 (L) 11/09/2018   HDL 56 11/09/2018   LDLCALC 14 11/09/2018   TRIG 128 11/09/2018     GEN- The patient is well appearing, alert and oriented x 3 today.   Head- normocephalic, atraumatic Eyes-  Sclera clear, conjunctiva pink Ears- hearing intact Oropharynx- clear Neck- supple, no JVP Lymph- no cervical lymphadenopathy Lungs- Clear to ausculation bilaterally, normal work of breathing Heart- regular rate and rhythm, no murmurs, rubs or gallops, PMI not laterally displaced GI- soft, NT, ND, + BS Extremities- no clubbing, cyanosis, or edema MS- no significant deformity or atrophy Skin- no rash or lesion Psych- euthymic mood, full affect Neuro- strength and sensation are intact  EKG- NSR at 62 bpm, pr int 192 ms, qrs int 118 ms, qtc 432ms    Assessment and Plan: 1. Paroxysmal afib with recent DCCV with ERAF with RVR Pt cannot take BB's and amiodarone for intolerance, hallucinations and suicidal ideations (remote use in 2015) He had a successful admit for dofetilide with return of SR after repeat CV Feels much better in SR General review of Tikosyn precautions   Bmet/mag today Continue warfarin  F/u with Dr. Lovena Le 8/5   Nathaniel [name], [facility] [address] [place],  [address] [phone]

## 2019-06-24 ENCOUNTER — Other Ambulatory Visit: Payer: Self-pay | Admitting: Cardiology

## 2019-07-18 DIAGNOSIS — E782 Mixed hyperlipidemia: Secondary | ICD-10-CM | POA: Diagnosis not present

## 2019-07-18 DIAGNOSIS — E1165 Type 2 diabetes mellitus with hyperglycemia: Secondary | ICD-10-CM | POA: Diagnosis not present

## 2019-07-19 ENCOUNTER — Ambulatory Visit: Payer: Medicare Other | Admitting: Internal Medicine

## 2019-07-24 ENCOUNTER — Telehealth: Payer: Self-pay

## 2019-07-24 NOTE — Telephone Encounter (Signed)
Unable to lmom for overdue inr

## 2019-07-25 DIAGNOSIS — I2510 Atherosclerotic heart disease of native coronary artery without angina pectoris: Secondary | ICD-10-CM | POA: Diagnosis not present

## 2019-07-25 DIAGNOSIS — E782 Mixed hyperlipidemia: Secondary | ICD-10-CM | POA: Diagnosis not present

## 2019-07-25 DIAGNOSIS — E1165 Type 2 diabetes mellitus with hyperglycemia: Secondary | ICD-10-CM | POA: Diagnosis not present

## 2019-07-25 DIAGNOSIS — I10 Essential (primary) hypertension: Secondary | ICD-10-CM | POA: Diagnosis not present

## 2019-07-25 DIAGNOSIS — M109 Gout, unspecified: Secondary | ICD-10-CM | POA: Diagnosis not present

## 2019-07-25 DIAGNOSIS — E1151 Type 2 diabetes mellitus with diabetic peripheral angiopathy without gangrene: Secondary | ICD-10-CM | POA: Diagnosis not present

## 2019-07-25 DIAGNOSIS — N139 Obstructive and reflux uropathy, unspecified: Secondary | ICD-10-CM | POA: Diagnosis not present

## 2019-07-25 DIAGNOSIS — E1159 Type 2 diabetes mellitus with other circulatory complications: Secondary | ICD-10-CM | POA: Diagnosis not present

## 2019-08-01 ENCOUNTER — Telehealth: Payer: Self-pay

## 2019-08-01 NOTE — Telephone Encounter (Signed)
Attempted to contact patient regarding incomplete sleep study.  No answer.

## 2019-09-11 ENCOUNTER — Other Ambulatory Visit: Payer: Self-pay

## 2019-09-11 ENCOUNTER — Encounter (INDEPENDENT_AMBULATORY_CARE_PROVIDER_SITE_OTHER): Payer: Self-pay

## 2019-09-11 ENCOUNTER — Ambulatory Visit (INDEPENDENT_AMBULATORY_CARE_PROVIDER_SITE_OTHER): Payer: Medicare Other | Admitting: *Deleted

## 2019-09-11 DIAGNOSIS — I4891 Unspecified atrial fibrillation: Secondary | ICD-10-CM

## 2019-09-11 DIAGNOSIS — H401122 Primary open-angle glaucoma, left eye, moderate stage: Secondary | ICD-10-CM | POA: Diagnosis not present

## 2019-09-11 DIAGNOSIS — Z961 Presence of intraocular lens: Secondary | ICD-10-CM | POA: Diagnosis not present

## 2019-09-11 DIAGNOSIS — Z5181 Encounter for therapeutic drug level monitoring: Secondary | ICD-10-CM | POA: Diagnosis not present

## 2019-09-11 DIAGNOSIS — H35373 Puckering of macula, bilateral: Secondary | ICD-10-CM | POA: Diagnosis not present

## 2019-09-11 LAB — POCT INR: INR: 2.2 (ref 2.0–3.0)

## 2019-09-11 NOTE — Patient Instructions (Signed)
Description   Continue normal dose of 3 tablets daily except 2 tablets on Mondays and Fridays. Recheck in 2 weeks. Call us with any new medications or concerns: Coumadin Clinic # [phone], Main # [phone].

## 2019-09-12 ENCOUNTER — Other Ambulatory Visit (HOSPITAL_COMMUNITY): Payer: Self-pay | Admitting: Cardiology

## 2019-09-13 DIAGNOSIS — L570 Actinic keratosis: Secondary | ICD-10-CM | POA: Diagnosis not present

## 2019-09-13 DIAGNOSIS — C44212 Basal cell carcinoma of skin of right ear and external auricular canal: Secondary | ICD-10-CM | POA: Diagnosis not present

## 2019-09-13 DIAGNOSIS — D485 Neoplasm of uncertain behavior of skin: Secondary | ICD-10-CM | POA: Diagnosis not present

## 2019-09-26 ENCOUNTER — Other Ambulatory Visit: Payer: Self-pay

## 2019-09-26 ENCOUNTER — Ambulatory Visit (INDEPENDENT_AMBULATORY_CARE_PROVIDER_SITE_OTHER): Payer: Medicare Other | Admitting: *Deleted

## 2019-09-26 DIAGNOSIS — I4891 Unspecified atrial fibrillation: Secondary | ICD-10-CM | POA: Diagnosis not present

## 2019-09-26 DIAGNOSIS — Z5181 Encounter for therapeutic drug level monitoring: Secondary | ICD-10-CM | POA: Diagnosis not present

## 2019-09-26 LAB — POCT INR: INR: 4.1 — AB (ref 2.0–3.0)

## 2019-09-26 NOTE — Patient Instructions (Signed)
Description   Do not take any Warfarin today and tomorrow take 2 tablets then continue normal dose of 3 tablets daily except 2 tablets on Mondays and Fridays. Recheck in 2 weeks. Call us with any new medications or concerns: Coumadin Clinic # [phone], Main # [phone].

## 2019-10-06 ENCOUNTER — Other Ambulatory Visit: Payer: Self-pay | Admitting: Cardiology

## 2019-10-17 DIAGNOSIS — I2510 Atherosclerotic heart disease of native coronary artery without angina pectoris: Secondary | ICD-10-CM | POA: Diagnosis not present

## 2019-10-17 DIAGNOSIS — E782 Mixed hyperlipidemia: Secondary | ICD-10-CM | POA: Diagnosis not present

## 2019-10-17 DIAGNOSIS — E1165 Type 2 diabetes mellitus with hyperglycemia: Secondary | ICD-10-CM | POA: Diagnosis not present

## 2019-10-20 ENCOUNTER — Telehealth (HOSPITAL_COMMUNITY): Payer: Self-pay

## 2019-10-20 NOTE — Telephone Encounter (Signed)
Received a fax from [employer] stating for the patient to be referred to In Lab Study because there was 2 failed HST attempt with WP One and WP 300 have been completed.

## 2019-10-23 ENCOUNTER — Other Ambulatory Visit (HOSPITAL_COMMUNITY): Payer: Self-pay | Admitting: *Deleted

## 2019-10-23 DIAGNOSIS — R0683 Snoring: Secondary | ICD-10-CM

## 2019-11-02 ENCOUNTER — Other Ambulatory Visit (HOSPITAL_COMMUNITY): Payer: Medicare Other

## 2019-11-04 ENCOUNTER — Encounter (HOSPITAL_BASED_OUTPATIENT_CLINIC_OR_DEPARTMENT_OTHER): Payer: Medicare Other | Admitting: Cardiology

## 2019-11-07 DIAGNOSIS — M109 Gout, unspecified: Secondary | ICD-10-CM | POA: Diagnosis not present

## 2019-11-07 DIAGNOSIS — E1159 Type 2 diabetes mellitus with other circulatory complications: Secondary | ICD-10-CM | POA: Diagnosis not present

## 2019-11-07 DIAGNOSIS — Z7984 Long term (current) use of oral hypoglycemic drugs: Secondary | ICD-10-CM | POA: Diagnosis not present

## 2019-11-07 DIAGNOSIS — E782 Mixed hyperlipidemia: Secondary | ICD-10-CM | POA: Diagnosis not present

## 2019-11-07 DIAGNOSIS — I129 Hypertensive chronic kidney disease with stage 1 through stage 4 chronic kidney disease, or unspecified chronic kidney disease: Secondary | ICD-10-CM | POA: Diagnosis not present

## 2019-11-07 DIAGNOSIS — N182 Chronic kidney disease, stage 2 (mild): Secondary | ICD-10-CM | POA: Diagnosis not present

## 2019-11-07 DIAGNOSIS — I2510 Atherosclerotic heart disease of native coronary artery without angina pectoris: Secondary | ICD-10-CM | POA: Diagnosis not present

## 2019-11-07 DIAGNOSIS — E1151 Type 2 diabetes mellitus with diabetic peripheral angiopathy without gangrene: Secondary | ICD-10-CM | POA: Diagnosis not present

## 2019-11-07 DIAGNOSIS — E1122 Type 2 diabetes mellitus with diabetic chronic kidney disease: Secondary | ICD-10-CM | POA: Diagnosis not present

## 2019-11-08 ENCOUNTER — Other Ambulatory Visit (HOSPITAL_COMMUNITY): Payer: Self-pay | Admitting: Cardiology

## 2019-11-20 ENCOUNTER — Telehealth (HOSPITAL_COMMUNITY): Payer: Self-pay

## 2019-11-20 NOTE — Telephone Encounter (Signed)
Spoke with Patient this morning after he left a message on the Triage Line to return call about [facility] Sleep Study. He stated that he spoke with [employer] but misunderstood what they said. He thought that he needed to come into our office for the split night but I informed him that he needed to contact them back for more information. He agreed to call the company back.

## 2019-11-23 ENCOUNTER — Other Ambulatory Visit: Payer: Self-pay | Admitting: Cardiology

## 2019-12-14 ENCOUNTER — Other Ambulatory Visit: Payer: Self-pay

## 2019-12-14 DIAGNOSIS — I714 Abdominal aortic aneurysm, without rupture, unspecified: Secondary | ICD-10-CM

## 2019-12-14 DIAGNOSIS — I6523 Occlusion and stenosis of bilateral carotid arteries: Secondary | ICD-10-CM

## 2019-12-18 ENCOUNTER — Ambulatory Visit (HOSPITAL_COMMUNITY)
Admission: RE | Admit: 2019-12-18 | Discharge: 2019-12-18 | Disposition: A | Discharge status: Home / Self Care | Payer: Medicare Other | Source: Ambulatory Visit | Attending: Surgery | Admitting: Surgery

## 2019-12-18 ENCOUNTER — Ambulatory Visit (INDEPENDENT_AMBULATORY_CARE_PROVIDER_SITE_OTHER)
Admission: RE | Admit: 2019-12-18 | Discharge: 2019-12-18 | Disposition: A | Discharge status: Home / Self Care | Payer: Medicare Other | Source: Ambulatory Visit | Attending: Surgery | Admitting: Surgery

## 2019-12-18 ENCOUNTER — Other Ambulatory Visit: Payer: Self-pay

## 2019-12-18 ENCOUNTER — Ambulatory Visit (INDEPENDENT_AMBULATORY_CARE_PROVIDER_SITE_OTHER): Payer: Medicare Other | Admitting: Surgery

## 2019-12-18 ENCOUNTER — Encounter: Payer: Self-pay | Admitting: Surgery

## 2019-12-18 VITALS — BP 149/79 | HR 74 | Temp 97.9°F | Resp 20 | Ht 70.0 in | Wt 204.6 lb

## 2019-12-18 DIAGNOSIS — I6523 Occlusion and stenosis of bilateral carotid arteries: Secondary | ICD-10-CM | POA: Insufficient documentation

## 2019-12-18 DIAGNOSIS — I714 Abdominal aortic aneurysm, without rupture, unspecified: Secondary | ICD-10-CM

## 2019-12-18 NOTE — Progress Notes (Signed)
[facility] [place]  Patient name: Nathaniel [name]. MRN: [id number] DOB: Sep 23, 1932 Sex: male   REASON FOR VISIT:    Follow up  HISOTRY OF PRESENT ILLNESS:    Nathaniel [name]. is a 86 y.o. male who is back today for follow-up of his abdominal aortic aneurysm on 04/13/2014.  He is also status post right carotid endarterectomy on 02/02/2018 for a high-grade asymptomatic stenosis.  He remains on Coumadin for atrial fibrillation.  He continues to take a statin for hypercholesterolemia.   PAST MEDICAL HISTORY:   Past Medical History:  Diagnosis Date  . AAA (abdominal aortic aneurysm) ([place])   . Atrial fibrillation ([place])   . Atrial fibrillation ([place])   . CAD (coronary artery disease)   . Cataract   . CHF (congestive heart failure) ([place])   . Chronic kidney disease   . Colon polyps    adenomatous  . Diabetes mellitus   . Diverticulosis   . Dysrhythmia    A-Fib  . GERD (gastroesophageal reflux disease)   . Gout   . Hernia, abdominal   . History of aortic valve replacement with bioprosthetic valve    2007  . Hx: UTI (urinary tract infection)   . Hyperlipemia   . Hypertension   . Internal hemorrhoids   . Peripheral vascular disease ([place])   . Shortness of breath    with exertion     FAMILY HISTORY:   Family History  Problem Relation Age of Onset  . Heart disease Mother   . Heart disease Father        before age 17  . AAA (abdominal aortic aneurysm) Father   . Colon cancer Neg Hx     SOCIAL HISTORY:   Social History   Tobacco Use  . Smoking status: Former Smoker    Packs/day: 0.25    Years: 20.00    Pack years: 5.00    Types: Cigarettes    Quit date: 03/23/1992    Years since quitting: 27.7  . Smokeless tobacco: Never Used  Substance Use Topics  . Alcohol use: Yes    Alcohol/week: 10.0 standard drinks    Types: 10 Glasses of wine per week    Comment: 1-2 drinks daily      ALLERGIES:    Allergies  Allergen Reactions  . Grass Extracts [Gramineae Pollens] Other (See Comments)    Sneezing and runny nose  . Milk-Related Compounds Other (See Comments)    Just Yogurt.  Makes him very lethargic.  Marland Kitchen Amiodarone Anxiety    depression  . Metoprolol     Other reaction(s): Lethargy (intolerance)     CURRENT MEDICATIONS:   Current Outpatient Medications  Medication Sig Dispense Refill  . allopurinol (ZYLOPRIM) 300 MG tablet Take 300 mg by mouth daily.   2  . atorvastatin (LIPITOR) 10 MG tablet TAKE ONE TABLET BY MOUTH ONCE DAILY (Patient taking differently: Take 10 mg by mouth every other day. Also take 20 mg tablet every night) 30 tablet 10  . atorvastatin (LIPITOR) 20 MG tablet TAKE ONE TABLET EVERY OTHER DAY 30 tablet 0  . Coenzyme Q10 (CO Q10) 100 MG CAPS Take 100 mg by mouth daily.    Marland Kitchen dofetilide (TIKOSYN) 250 MCG capsule Take 1 capsule (250 mcg total) by mouth 2 (two) times daily. 60 capsule 6  . doxazosin (CARDURA) 1 MG tablet Take 1 mg by mouth at bedtime. Also take 2 mg tablet every morning    . doxazosin (  CARDURA) 2 MG tablet Take 2 mg by mouth every morning. Also take 1 mg tablet every night    . empagliflozin (JARDIANCE) 10 MG TABS tablet Take 10 mg by mouth at bedtime.     . Evolocumab (REPATHA SURECLICK) 188 MG/ML SOAJ Inject 1 pen into the skin every 14 (fourteen) days. 2 pen 11  . ezetimibe (ZETIA) 10 MG tablet Take 10 mg by mouth at bedtime.     . finasteride (PROSCAR) 5 MG tablet Take 1 tablet (5 mg total) by mouth at bedtime. 90 tablet 3  . furosemide (LASIX) 20 MG tablet Take 2 tablets (40 mg total) by mouth daily. (Patient taking differently: Take 20 mg by mouth See admin instructions. Take one tablet (20 mg) by mouth daily, replace with 40 mg tablet every few days.) 60 tablet 3  . furosemide (LASIX) 40 MG tablet Take 40 mg by mouth See admin instructions. Take one tablet (40 mg) by mouth instead of 20 mg tablet every few days.    . Glucosamine HCl-MSM  (GLUCOSAMINE-MSM PO) Take 1 tablet by mouth daily.    Marland Kitchen latanoprost (XALATAN) 0.005 % ophthalmic solution Place 1 drop into both eyes at bedtime.     Marland Kitchen loratadine-pseudoephedrine ([name] 24-HOUR) 10-240 MG 24 hr tablet Take 1 tablet by mouth daily as needed (seasonal allergies).     . Multiple Vitamins-Minerals (OCUVITE EXTRA PO) Take 1 tablet by mouth daily.    . ramipril (ALTACE) 2.5 MG capsule TAKE ONE CAPSULE EACH DAY WITH RAMIPRIL 5MG  (TOTAL DOSE = 7.5MG ) 90 capsule 0  . ramipril (ALTACE) 5 MG capsule TAKE ONE CAPSULE EACH DAY (Patient taking differently: Take 5 mg by mouth every morning. Also take 2.5 mg capsule every night) 90 capsule 3  . spironolactone (ALDACTONE) 25 MG tablet TAKE ONE TABLET EACH DAY 30 tablet 3  . tamsulosin (FLOMAX) 0.4 MG CAPS capsule Take 0.4 mg by mouth See admin instructions. Take one capsule (0.4 mg) in the morning, may take a second 0.4 mg at night as needed for urinary retention    . timolol (TIMOPTIC) 0.5 % ophthalmic solution Place 1 drop into both eyes daily.   5  . warfarin (COUMADIN) 3 MG tablet TAKE 3 TABLETS DAILY EXCEPT 2 TABLETS ONMONDAY AND FRIDAY OR TAKE AS DIRECTED BYCOUMADIN CLINIC 80 tablet 0   No current facility-administered medications for this visit.    REVIEW OF SYSTEMS:   [X]  denotes positive finding, [ ]  denotes negative finding Cardiac  Comments:  Chest pain or chest pressure:    Shortness of breath upon exertion:    Short of breath when lying flat:    Irregular heart rhythm:        Vascular    Pain in calf, thigh, or hip brought on by ambulation:    Pain in feet at night that wakes you up from your sleep:     Blood clot in your veins:    Leg swelling:         Pulmonary    Oxygen at home:    Productive cough:     Wheezing:         Neurologic    Sudden weakness in arms or legs:     Sudden numbness in arms or legs:     Sudden onset of difficulty speaking or slurred speech:    Temporary loss of vision in one eye:       Problems with dizziness:         Gastrointestinal  Blood in stool:     Vomited blood:         Genitourinary    Burning when urinating:     Blood in urine:        Psychiatric    Major depression:         Hematologic    Bleeding problems:    Problems with blood clotting too easily:        Skin    Rashes or ulcers:        Constitutional    Fever or chills:      PHYSICAL EXAM:   Vitals:   12/18/19 0900 12/18/19 0902  BP: (!) 143/70 (!) 149/79  Pulse: 74   Resp: 20   Temp: 97.9 F (36.6 C)   SpO2: 96%   Weight: 204 lb 9.6 oz (92.8 kg)   Height: 5\' 10"  (1.778 m)     GENERAL: The patient is a well-nourished male, in no acute distress. The vital signs are documented above. CARDIAC: There is a regular rate and rhythm.  PULMONARY: Non-labored respirations ABDOMEN: Soft and non-tender with normal pitched bowel sounds.  MUSCULOSKELETAL: There are no major deformities or cyanosis. NEUROLOGIC: No focal weakness or paresthesias are detected. SKIN: There are no ulcers or rashes noted. PSYCHIATRIC: The patient has a normal affect.  STUDIES:   I have ordered and reviewed the following: Endovascular Aortic Repair (EVAR): +----------+----------------+-------------------+-------------------+           Diameter AP (cm)Diameter Trans (cm)Velocities (cm/sec) +----------+----------------+-------------------+-------------------+ Aorta     3.65            4.26               98                  +----------+----------------+-------------------+-------------------+ Right Limb1.57            1.57                                   +----------+----------------+-------------------+-------------------+ Left Limb 1.32            1.39                                   +----------+----------------+-------------------+-------------------+  Carotid: Right Carotid: Velocities in the right ICA are consistent with a 1-39% stenosis.                Decrease in stenosis  category since previous exam.  Left Carotid: Velocities in the left ICA are consistent with a 1-39% stenosis.               Decrease in stenosis category since previous exam.  Vertebrals:  Bilateral vertebral arteries demonstrate antegrade flow. Subclavians: Normal flow hemodynamics were seen in bilateral subclavian              arteries. MEDICAL ISSUES:   AAA: Maximum aortic diameter is 4.2 cm which is stable.  I will repeat his ultrasound in 1 year  Carotid: 1-39% stenosis.  I will repeat his ultrasound in 1 year.    Leia Alf, MD, FACS [facility] [phone] Pager [phone]

## 2019-12-22 ENCOUNTER — Other Ambulatory Visit: Payer: Self-pay | Admitting: *Deleted

## 2019-12-22 DIAGNOSIS — I714 Abdominal aortic aneurysm, without rupture, unspecified: Secondary | ICD-10-CM

## 2019-12-22 DIAGNOSIS — I6523 Occlusion and stenosis of bilateral carotid arteries: Secondary | ICD-10-CM

## 2019-12-30 ENCOUNTER — Other Ambulatory Visit (HOSPITAL_COMMUNITY): Payer: Self-pay | Admitting: Cardiology

## 2019-12-30 DIAGNOSIS — I779 Disorder of arteries and arterioles, unspecified: Secondary | ICD-10-CM

## 2019-12-30 DIAGNOSIS — I5022 Chronic systolic (congestive) heart failure: Secondary | ICD-10-CM

## 2019-12-30 DIAGNOSIS — I480 Paroxysmal atrial fibrillation: Secondary | ICD-10-CM

## 2020-01-11 ENCOUNTER — Other Ambulatory Visit (HOSPITAL_COMMUNITY): Payer: Self-pay | Admitting: Cardiology

## 2020-01-25 ENCOUNTER — Other Ambulatory Visit (HOSPITAL_COMMUNITY): Payer: Self-pay | Admitting: Cardiology

## 2020-01-25 NOTE — Telephone Encounter (Signed)
Last INR check 09/26/2019, was due for recheck 10/10/19 which he No Showed for.  Attempted to contact pt to reschedule follow-up appt [facility] TCB for appt. Will await call back.

## 2020-02-05 DIAGNOSIS — E1165 Type 2 diabetes mellitus with hyperglycemia: Secondary | ICD-10-CM | POA: Diagnosis not present

## 2020-02-05 DIAGNOSIS — E782 Mixed hyperlipidemia: Secondary | ICD-10-CM | POA: Diagnosis not present

## 2020-02-06 NOTE — Telephone Encounter (Signed)
Called pt since he is overdue (last seen in October 2020) and spoke with pt for 10 minutes as he needed directions on how to get here and at the end of the conversation pt states he knows exactly where he will be coming for the appt. He states he will be busy the next three days after tomorrow and need to come at 430pm tomorrow, advised that we do not have any appts at that time and he is more than welcome to come at 330pm, pt states he is not out of Warfarin medication so he be available on Monday. Warfarin refill will be evaluated at visit on 02/12/2020 and pt is aware.

## 2020-02-07 DIAGNOSIS — E1159 Type 2 diabetes mellitus with other circulatory complications: Secondary | ICD-10-CM | POA: Diagnosis not present

## 2020-02-07 DIAGNOSIS — E782 Mixed hyperlipidemia: Secondary | ICD-10-CM | POA: Diagnosis not present

## 2020-02-07 DIAGNOSIS — E1151 Type 2 diabetes mellitus with diabetic peripheral angiopathy without gangrene: Secondary | ICD-10-CM | POA: Diagnosis not present

## 2020-02-07 DIAGNOSIS — E1165 Type 2 diabetes mellitus with hyperglycemia: Secondary | ICD-10-CM | POA: Diagnosis not present

## 2020-02-07 DIAGNOSIS — I10 Essential (primary) hypertension: Secondary | ICD-10-CM | POA: Diagnosis not present

## 2020-02-07 DIAGNOSIS — M109 Gout, unspecified: Secondary | ICD-10-CM | POA: Diagnosis not present

## 2020-02-07 DIAGNOSIS — I2510 Atherosclerotic heart disease of native coronary artery without angina pectoris: Secondary | ICD-10-CM | POA: Diagnosis not present

## 2020-02-12 ENCOUNTER — Other Ambulatory Visit: Payer: Self-pay

## 2020-02-12 ENCOUNTER — Ambulatory Visit (INDEPENDENT_AMBULATORY_CARE_PROVIDER_SITE_OTHER): Payer: Medicare Other | Admitting: *Deleted

## 2020-02-12 DIAGNOSIS — I4891 Unspecified atrial fibrillation: Secondary | ICD-10-CM

## 2020-02-12 DIAGNOSIS — Z5181 Encounter for therapeutic drug level monitoring: Secondary | ICD-10-CM | POA: Diagnosis not present

## 2020-02-12 LAB — POCT INR: INR: 1.7 — AB (ref 2.0–3.0)

## 2020-02-12 NOTE — Patient Instructions (Signed)
Description   Take 3 tablets today and then continue normal dose of 3 tablets daily except 2 tablets on Mondays and Fridays. Recheck in 2 weeks. Call us with any new medications or concerns: [facility] # [phone], Main # [phone].

## 2020-02-27 ENCOUNTER — Other Ambulatory Visit: Payer: Self-pay

## 2020-02-27 ENCOUNTER — Ambulatory Visit (INDEPENDENT_AMBULATORY_CARE_PROVIDER_SITE_OTHER): Payer: Medicare Other | Admitting: *Deleted

## 2020-02-27 DIAGNOSIS — Z5181 Encounter for therapeutic drug level monitoring: Secondary | ICD-10-CM

## 2020-02-27 DIAGNOSIS — I4891 Unspecified atrial fibrillation: Secondary | ICD-10-CM | POA: Diagnosis not present

## 2020-02-27 LAB — POCT INR: INR: 2.4 (ref 2.0–3.0)

## 2020-02-27 NOTE — Patient Instructions (Signed)
Description   Continue normal dose of 3 tablets daily except 2 tablets on Mondays and Fridays. Recheck in 4 weeks. Call us with any new medications or concerns: [facility] # [phone], Main # [phone].

## 2020-04-16 ENCOUNTER — Other Ambulatory Visit: Payer: Self-pay

## 2020-04-16 ENCOUNTER — Ambulatory Visit (INDEPENDENT_AMBULATORY_CARE_PROVIDER_SITE_OTHER): Payer: Medicare Other | Admitting: *Deleted

## 2020-04-16 DIAGNOSIS — Z5181 Encounter for therapeutic drug level monitoring: Secondary | ICD-10-CM | POA: Diagnosis not present

## 2020-04-16 DIAGNOSIS — I4891 Unspecified atrial fibrillation: Secondary | ICD-10-CM

## 2020-04-16 LAB — POCT INR: INR: 1.3 — AB (ref 2.0–3.0)

## 2020-04-16 MED ORDER — WARFARIN SODIUM 3 MG PO TABS: ORAL_TABLET | ORAL | 1 refills | Status: DC

## 2020-04-16 NOTE — Patient Instructions (Addendum)
Description   Today and tomorrow take 3.5 tablets then continue normal dose of 3 tablets daily except 2 tablets on Mondays and Fridays. Recheck in 2 weeks per pt request. Call us with any new medications or concerns: [facility] # [phone], Main # [phone].

## 2020-04-30 ENCOUNTER — Other Ambulatory Visit: Payer: Self-pay

## 2020-04-30 ENCOUNTER — Ambulatory Visit (INDEPENDENT_AMBULATORY_CARE_PROVIDER_SITE_OTHER): Payer: Medicare Other | Admitting: *Deleted

## 2020-04-30 DIAGNOSIS — Z5181 Encounter for therapeutic drug level monitoring: Secondary | ICD-10-CM | POA: Diagnosis not present

## 2020-04-30 DIAGNOSIS — B079 Viral wart, unspecified: Secondary | ICD-10-CM | POA: Diagnosis not present

## 2020-04-30 DIAGNOSIS — Z85828 Personal history of other malignant neoplasm of skin: Secondary | ICD-10-CM | POA: Diagnosis not present

## 2020-04-30 DIAGNOSIS — B078 Other viral warts: Secondary | ICD-10-CM | POA: Diagnosis not present

## 2020-04-30 DIAGNOSIS — C44329 Squamous cell carcinoma of skin of other parts of face: Secondary | ICD-10-CM | POA: Diagnosis not present

## 2020-04-30 DIAGNOSIS — I4891 Unspecified atrial fibrillation: Secondary | ICD-10-CM | POA: Diagnosis not present

## 2020-04-30 DIAGNOSIS — L578 Other skin changes due to chronic exposure to nonionizing radiation: Secondary | ICD-10-CM | POA: Diagnosis not present

## 2020-04-30 DIAGNOSIS — D485 Neoplasm of uncertain behavior of skin: Secondary | ICD-10-CM | POA: Diagnosis not present

## 2020-04-30 LAB — POCT INR: INR: 2.6 (ref 2.0–3.0)

## 2020-04-30 NOTE — Patient Instructions (Signed)
Description   Continue taking 3 tablets daily except 2 tablets on Mondays and Fridays. Recheck in 3 weeks. Call us with any new medications or concerns: [facility] # [phone], Main # [phone].

## 2020-05-01 DIAGNOSIS — E1165 Type 2 diabetes mellitus with hyperglycemia: Secondary | ICD-10-CM | POA: Diagnosis not present

## 2020-05-01 DIAGNOSIS — E782 Mixed hyperlipidemia: Secondary | ICD-10-CM | POA: Diagnosis not present

## 2020-05-02 DIAGNOSIS — E1165 Type 2 diabetes mellitus with hyperglycemia: Secondary | ICD-10-CM | POA: Diagnosis not present

## 2020-05-06 DIAGNOSIS — E1159 Type 2 diabetes mellitus with other circulatory complications: Secondary | ICD-10-CM | POA: Diagnosis not present

## 2020-05-06 DIAGNOSIS — E1165 Type 2 diabetes mellitus with hyperglycemia: Secondary | ICD-10-CM | POA: Diagnosis not present

## 2020-05-06 DIAGNOSIS — E782 Mixed hyperlipidemia: Secondary | ICD-10-CM | POA: Diagnosis not present

## 2020-05-06 DIAGNOSIS — M109 Gout, unspecified: Secondary | ICD-10-CM | POA: Diagnosis not present

## 2020-05-06 DIAGNOSIS — I10 Essential (primary) hypertension: Secondary | ICD-10-CM | POA: Diagnosis not present

## 2020-05-06 DIAGNOSIS — E1151 Type 2 diabetes mellitus with diabetic peripheral angiopathy without gangrene: Secondary | ICD-10-CM | POA: Diagnosis not present

## 2020-05-06 DIAGNOSIS — I2510 Atherosclerotic heart disease of native coronary artery without angina pectoris: Secondary | ICD-10-CM | POA: Diagnosis not present

## 2020-05-27 DIAGNOSIS — Z961 Presence of intraocular lens: Secondary | ICD-10-CM | POA: Diagnosis not present

## 2020-05-27 DIAGNOSIS — H35373 Puckering of macula, bilateral: Secondary | ICD-10-CM | POA: Diagnosis not present

## 2020-05-27 DIAGNOSIS — H401122 Primary open-angle glaucoma, left eye, moderate stage: Secondary | ICD-10-CM | POA: Diagnosis not present

## 2020-05-28 ENCOUNTER — Ambulatory Visit (INDEPENDENT_AMBULATORY_CARE_PROVIDER_SITE_OTHER): Payer: Medicare Other | Admitting: *Deleted

## 2020-05-28 ENCOUNTER — Other Ambulatory Visit: Payer: Self-pay

## 2020-05-28 DIAGNOSIS — I4891 Unspecified atrial fibrillation: Secondary | ICD-10-CM | POA: Diagnosis not present

## 2020-05-28 DIAGNOSIS — Z5181 Encounter for therapeutic drug level monitoring: Secondary | ICD-10-CM | POA: Diagnosis not present

## 2020-05-28 LAB — POCT INR: INR: 1.3 — AB (ref 2.0–3.0)

## 2020-05-28 NOTE — Patient Instructions (Addendum)
Description   Today and tomorrow take 3.5 tablets then continue taking 3 tablets daily except 2 tablets on Mondays and Fridays. Recheck in 2 weeks. Call us with any new medications or concerns: [facility] # [phone], Main # [phone].

## 2020-06-14 ENCOUNTER — Ambulatory Visit (INDEPENDENT_AMBULATORY_CARE_PROVIDER_SITE_OTHER): Payer: Medicare Other | Admitting: Pharmacist

## 2020-06-14 ENCOUNTER — Other Ambulatory Visit: Payer: Self-pay

## 2020-06-14 DIAGNOSIS — I4891 Unspecified atrial fibrillation: Secondary | ICD-10-CM | POA: Diagnosis not present

## 2020-06-14 DIAGNOSIS — Z5181 Encounter for therapeutic drug level monitoring: Secondary | ICD-10-CM

## 2020-06-14 LAB — POCT INR: INR: 2.1 (ref 2.0–3.0)

## 2020-06-14 NOTE — Patient Instructions (Signed)
Continue taking 3 tablets daily except 2 tablets on Mondays and Fridays. Recheck in 3 weeks. Call us with any new medications or concerns: [facility] # [phone], Main # [phone].

## 2020-06-28 ENCOUNTER — Other Ambulatory Visit (HOSPITAL_COMMUNITY): Payer: Self-pay | Admitting: Nurse Practitioner

## 2020-07-01 ENCOUNTER — Other Ambulatory Visit: Payer: Self-pay | Admitting: Cardiology

## 2020-07-05 ENCOUNTER — Other Ambulatory Visit: Payer: Self-pay

## 2020-07-05 ENCOUNTER — Ambulatory Visit (INDEPENDENT_AMBULATORY_CARE_PROVIDER_SITE_OTHER): Payer: Medicare Other

## 2020-07-05 DIAGNOSIS — Z5181 Encounter for therapeutic drug level monitoring: Secondary | ICD-10-CM

## 2020-07-05 DIAGNOSIS — I4891 Unspecified atrial fibrillation: Secondary | ICD-10-CM | POA: Diagnosis not present

## 2020-07-05 LAB — POCT INR: INR: 2 (ref 2.0–3.0)

## 2020-07-05 MED ORDER — WARFARIN SODIUM 3 MG PO TABS: ORAL_TABLET | ORAL | 0 refills | Status: DC

## 2020-07-05 NOTE — Patient Instructions (Signed)
Continue taking 3 tablets daily except 2 tablets on Mondays and Fridays. Recheck in 4 weeks. Call us with any new medications or concerns: [facility] # [phone], Main # [phone].

## 2020-07-22 ENCOUNTER — Other Ambulatory Visit: Payer: Self-pay | Admitting: Cardiology

## 2020-08-01 DIAGNOSIS — E1165 Type 2 diabetes mellitus with hyperglycemia: Secondary | ICD-10-CM | POA: Diagnosis not present

## 2020-08-01 DIAGNOSIS — E782 Mixed hyperlipidemia: Secondary | ICD-10-CM | POA: Diagnosis not present

## 2020-08-02 ENCOUNTER — Ambulatory Visit (INDEPENDENT_AMBULATORY_CARE_PROVIDER_SITE_OTHER): Payer: Medicare Other | Admitting: *Deleted

## 2020-08-02 ENCOUNTER — Other Ambulatory Visit: Payer: Self-pay

## 2020-08-02 DIAGNOSIS — Z5181 Encounter for therapeutic drug level monitoring: Secondary | ICD-10-CM | POA: Diagnosis not present

## 2020-08-02 DIAGNOSIS — I4891 Unspecified atrial fibrillation: Secondary | ICD-10-CM

## 2020-08-02 LAB — POCT INR: INR: 1.4 — AB (ref 2.0–3.0)

## 2020-08-02 NOTE — Patient Instructions (Addendum)
Description   Take 3 tablet today and 4 tablets tomorrow, then continue to take 3 tablets daily except for 2 tablets on Mondays and Fridays. Recheck INR in 10 days.  Call us with any new medications or concerns: [facility] # [phone], Main # [phone].

## 2020-08-07 DIAGNOSIS — E782 Mixed hyperlipidemia: Secondary | ICD-10-CM | POA: Diagnosis not present

## 2020-08-07 DIAGNOSIS — I6523 Occlusion and stenosis of bilateral carotid arteries: Secondary | ICD-10-CM | POA: Diagnosis not present

## 2020-08-07 DIAGNOSIS — I70209 Unspecified atherosclerosis of native arteries of extremities, unspecified extremity: Secondary | ICD-10-CM | POA: Diagnosis not present

## 2020-08-07 DIAGNOSIS — I2510 Atherosclerotic heart disease of native coronary artery without angina pectoris: Secondary | ICD-10-CM | POA: Diagnosis not present

## 2020-08-07 DIAGNOSIS — I130 Hypertensive heart and chronic kidney disease with heart failure and stage 1 through stage 4 chronic kidney disease, or unspecified chronic kidney disease: Secondary | ICD-10-CM | POA: Diagnosis not present

## 2020-08-07 DIAGNOSIS — E1122 Type 2 diabetes mellitus with diabetic chronic kidney disease: Secondary | ICD-10-CM | POA: Diagnosis not present

## 2020-08-07 DIAGNOSIS — N183 Chronic kidney disease, stage 3 unspecified: Secondary | ICD-10-CM | POA: Diagnosis not present

## 2020-08-07 DIAGNOSIS — N139 Obstructive and reflux uropathy, unspecified: Secondary | ICD-10-CM | POA: Diagnosis not present

## 2020-08-07 DIAGNOSIS — I5022 Chronic systolic (congestive) heart failure: Secondary | ICD-10-CM | POA: Diagnosis not present

## 2020-08-08 ENCOUNTER — Other Ambulatory Visit: Payer: Self-pay | Admitting: Internal Medicine

## 2020-08-13 ENCOUNTER — Ambulatory Visit (INDEPENDENT_AMBULATORY_CARE_PROVIDER_SITE_OTHER): Payer: Medicare Other

## 2020-08-13 ENCOUNTER — Other Ambulatory Visit: Payer: Self-pay

## 2020-08-13 DIAGNOSIS — Z5181 Encounter for therapeutic drug level monitoring: Secondary | ICD-10-CM | POA: Diagnosis not present

## 2020-08-13 DIAGNOSIS — I4891 Unspecified atrial fibrillation: Secondary | ICD-10-CM

## 2020-08-13 LAB — POCT INR: INR: 1.5 — AB (ref 2.0–3.0)

## 2020-08-13 NOTE — Patient Instructions (Signed)
Description   Take 4 tablets today and tomorrow, then resume same dosage 3 tablets daily except for 2 tablets on Mondays and Fridays. Recheck INR in 2 weeks.  Call us with any new medications or concerns: [facility] # [phone], Main # [phone].

## 2020-08-21 DIAGNOSIS — Z23 Encounter for immunization: Secondary | ICD-10-CM | POA: Diagnosis not present

## 2020-08-22 ENCOUNTER — Telehealth (HOSPITAL_COMMUNITY): Payer: Self-pay

## 2020-08-22 NOTE — Telephone Encounter (Signed)
Patient called wanting to pick up medical records;records left up front for pick up

## 2020-08-27 ENCOUNTER — Ambulatory Visit (INDEPENDENT_AMBULATORY_CARE_PROVIDER_SITE_OTHER): Payer: Medicare Other | Admitting: *Deleted

## 2020-08-27 ENCOUNTER — Other Ambulatory Visit: Payer: Self-pay

## 2020-08-27 DIAGNOSIS — I4891 Unspecified atrial fibrillation: Secondary | ICD-10-CM | POA: Diagnosis not present

## 2020-08-27 DIAGNOSIS — Z5181 Encounter for therapeutic drug level monitoring: Secondary | ICD-10-CM

## 2020-08-27 LAB — POCT INR: INR: 1.4 — AB (ref 2.0–3.0)

## 2020-08-27 NOTE — Patient Instructions (Signed)
Description   Take 4 tablets today and tomorrow, then start taking 3 tablets daily. Recheck INR in 10 days.  Call us with any new medications or concerns: [facility] # [phone], Main # [phone].

## 2020-09-06 ENCOUNTER — Other Ambulatory Visit: Payer: Self-pay

## 2020-09-06 ENCOUNTER — Ambulatory Visit (INDEPENDENT_AMBULATORY_CARE_PROVIDER_SITE_OTHER): Payer: Medicare Other | Admitting: *Deleted

## 2020-09-06 DIAGNOSIS — I4891 Unspecified atrial fibrillation: Secondary | ICD-10-CM

## 2020-09-06 DIAGNOSIS — Z5181 Encounter for therapeutic drug level monitoring: Secondary | ICD-10-CM | POA: Diagnosis not present

## 2020-09-06 LAB — POCT INR: INR: 2 (ref 2.0–3.0)

## 2020-09-06 NOTE — Patient Instructions (Signed)
Description   Continue taking 3 tablets daily. Recheck INR in 2 weeks.  Call us with any new medications or concerns: [facility] # [phone], Main # [phone].

## 2020-09-10 ENCOUNTER — Other Ambulatory Visit: Payer: Self-pay

## 2020-09-10 ENCOUNTER — Ambulatory Visit (HOSPITAL_COMMUNITY)
Admission: RE | Admit: 2020-09-10 | Discharge: 2020-09-10 | Disposition: A | Discharge status: Home / Self Care | Payer: Medicare Other | Source: Ambulatory Visit | Attending: Cardiology | Admitting: Cardiology

## 2020-09-10 VITALS — BP 142/80 | HR 78 | Ht 70.0 in | Wt 194.6 lb

## 2020-09-10 DIAGNOSIS — K219 Gastro-esophageal reflux disease without esophagitis: Secondary | ICD-10-CM | POA: Insufficient documentation

## 2020-09-10 DIAGNOSIS — I480 Paroxysmal atrial fibrillation: Secondary | ICD-10-CM | POA: Insufficient documentation

## 2020-09-10 DIAGNOSIS — Z7901 Long term (current) use of anticoagulants: Secondary | ICD-10-CM | POA: Diagnosis not present

## 2020-09-10 DIAGNOSIS — Z79899 Other long term (current) drug therapy: Secondary | ICD-10-CM | POA: Diagnosis not present

## 2020-09-10 DIAGNOSIS — E785 Hyperlipidemia, unspecified: Secondary | ICD-10-CM | POA: Insufficient documentation

## 2020-09-10 DIAGNOSIS — I739 Peripheral vascular disease, unspecified: Secondary | ICD-10-CM | POA: Insufficient documentation

## 2020-09-10 DIAGNOSIS — Z7984 Long term (current) use of oral hypoglycemic drugs: Secondary | ICD-10-CM | POA: Insufficient documentation

## 2020-09-10 DIAGNOSIS — I110 Hypertensive heart disease with heart failure: Secondary | ICD-10-CM | POA: Diagnosis not present

## 2020-09-10 DIAGNOSIS — G4733 Obstructive sleep apnea (adult) (pediatric): Secondary | ICD-10-CM | POA: Insufficient documentation

## 2020-09-10 DIAGNOSIS — I25810 Atherosclerosis of coronary artery bypass graft(s) without angina pectoris: Secondary | ICD-10-CM | POA: Diagnosis not present

## 2020-09-10 DIAGNOSIS — I6529 Occlusion and stenosis of unspecified carotid artery: Secondary | ICD-10-CM | POA: Insufficient documentation

## 2020-09-10 DIAGNOSIS — M109 Gout, unspecified: Secondary | ICD-10-CM | POA: Insufficient documentation

## 2020-09-10 DIAGNOSIS — I5022 Chronic systolic (congestive) heart failure: Secondary | ICD-10-CM | POA: Diagnosis not present

## 2020-09-10 DIAGNOSIS — Z953 Presence of xenogenic heart valve: Secondary | ICD-10-CM | POA: Diagnosis not present

## 2020-09-10 DIAGNOSIS — Z951 Presence of aortocoronary bypass graft: Secondary | ICD-10-CM | POA: Diagnosis not present

## 2020-09-10 DIAGNOSIS — I5042 Chronic combined systolic (congestive) and diastolic (congestive) heart failure: Secondary | ICD-10-CM | POA: Diagnosis not present

## 2020-09-10 DIAGNOSIS — E119 Type 2 diabetes mellitus without complications: Secondary | ICD-10-CM | POA: Insufficient documentation

## 2020-09-10 LAB — BASIC METABOLIC PANEL
Anion gap: 7 (ref 5–15)
BUN: 13 mg/dL (ref 8–23)
CO2: 24 mmol/L (ref 22–32)
Calcium: 8.6 mg/dL — ABNORMAL LOW (ref 8.9–10.3)
Chloride: 107 mmol/L (ref 98–111)
Creatinine, Ser: 1.02 mg/dL (ref 0.61–1.24)
GFR calc Af Amer: >60 mL/min (ref >60)
GFR calc non Af Amer: >60 mL/min (ref >60)
Glucose, Bld: 205 mg/dL — ABNORMAL HIGH (ref 70–99)
Potassium: 4.1 mmol/L (ref 3.5–5.1)
Sodium: 138 mmol/L (ref 135–145)

## 2020-09-10 LAB — LIPID PANEL
Cholesterol: 208 mg/dL — ABNORMAL HIGH (ref 0–200)
HDL: 46 mg/dL (ref >40)
LDL Cholesterol: 129 mg/dL — ABNORMAL HIGH (ref 0–99)
Total CHOL/HDL Ratio: 4.5 RATIO
Triglycerides: 164 mg/dL — ABNORMAL HIGH (ref <150)
VLDL: 33 mg/dL (ref 0–40)

## 2020-09-10 LAB — CBC
HCT: 49 % (ref 39.0–52.0)
Hemoglobin: 14.8 g/dL (ref 13.0–17.0)
MCH: 30 pg (ref 26.0–34.0)
MCHC: 30.2 g/dL (ref 30.0–36.0)
MCV: 99.4 fL (ref 80.0–100.0)
Platelets: 163 10*3/uL (ref 150–400)
RBC: 4.93 MIL/uL (ref 4.22–5.81)
RDW: 13.2 % (ref 11.5–15.5)
WBC: 5.4 10*3/uL (ref 4.0–10.5)
nRBC: 0 % (ref 0.0–0.2)

## 2020-09-10 LAB — MAGNESIUM: Magnesium: 1.9 mg/dL (ref 1.7–2.4)

## 2020-09-10 NOTE — Patient Instructions (Addendum)
Try over the counter Melatonin for sleep.   Labs today We will only contact you if something comes back abnormal or we need to make some changes. Otherwise no news is good news!   You will be contacted to complete a Sleep Study.   Your physician has requested that you have an echocardiogram. Echocardiography is a painless test that uses sound waves to create images of your heart. It provides your doctor with information about the size and shape of your heart and how well your heart's chambers and valves are working. This procedure takes approximately one hour. There are no restrictions for this procedure.   Your physician recommends that you schedule a follow-up appointment in: 3 months with an ECHO   Please call office at [phone] option 2 if you have any questions or concerns.    At the [facility], you and your health needs are our priority. As part of our continuing mission to provide you with exceptional heart care, we have created designated Provider Care Teams. These Care Teams include your primary Cardiologist (physician) and Advanced Practice Providers (APPs- Physician Assistants and Nurse Practitioners) who all work together to provide you with the care you need, when you need it.   You may see any of the following providers on your designated Care Team at your next follow up: Marland Kitchen Dr Glori Bickers . Dr [name] . Darrick Grinder, NP . Lyda Jester, PA . Audry Riles, PharmD   Please be sure to bring in all your medications bottles to every appointment.

## 2020-09-10 NOTE — Progress Notes (Signed)
Patient ID: Nathaniel [name]., male   DOB: [birth year], 86 y.o.   MRN: [id number] PCP: Dr. Elyse Hsu Cardiology: Dr. Aundra Dubin  86 y.o. with history of extensive vascular disease including CAD s/p CABG and redo CABG, AAA s/p repair, PAD, and carotid stenosis as well as prior AVR presents for followup of CAD and atrial fibrillation.   He has had documentation of significant peripheral arterial disease.  He gets bilateral calf soreness after walking for about 15 minutes.  This resolves with rest, no rest pain.  No pedal ulcers, though when he gets cuts on his feet, they heal slowly.  Peripheral arterial dopplers done in 10/14 showed > 50% focal SFA stenoses bilaterally.  He has carotid artery disease.  No stroke-like symptoms.  He had a endovascular AAA repair in 5/15.    After his AAA repair, he was noted to be in atrial fibrillation.  He had had a prior episode of atrial fibrillation after his cardiac surgery in 2007.  Atrial fibrillation was rate-controlled.  He was started on warfarin and Toprol XL.  He developed exertional dyspnea and fatigue in atrial fibrillation. I cardioverted him back to NSR on 06/06/14.  I also had him get an echocardiogram in 6/15.  This showed EF worsened to 25-30% with stable bioprosthetic aortic valve, mildly dilated RV with normal systolic function.  Given the fall in EF, I took him for Nathaniel [name] in 7/15.  This showed stable anatomy.  The LIMA-LAD was patent, the sequential SVG-OM/ramus/D was patent only to ramus but this is the same as the prior cath, and the SVG-PDA was patent with a long 50-60% mid-graft stenosis.  No intervention.  Of note, at cath he was noted to be back in atrial fibrillation.  I then started him on amiodarone.  I took Nathaniel [name] for [place] again on 08/21/14.  This was successful.  He has been off amiodarone due to side effects (hallucinations).  Echo in 1/16 showed recovery of EF to 55-60%.    He was admitted in 1/19 with bronchitis/wheezing/dyspnea and was found to be in  atrial fibrillation with RVR.  He tested positive for metapneumovirus.  Echo showed EF 45-50% with normal bioprosthetic aortic valve. He was diuresed for volume overload while in the [name]. He had DCCV to NSR.    Carotid dopplers in 1/19 showed 80-99% RICA stenosis, he had right CEA in 6/19 with no complications.   At appointment in early 6/20, Nathaniel [name] was noted to be back in atrial fibrillation with RVR and mild CHF.  He was started on Toprol XL and Lasix was increased.  He had TEE-guided DCCV to NSR in 6/20.  TEE showed EF 50%, mild diffuse hypokinesis, mildly decreased RV systolic function, normally functioning bioprosthetic aortic valve.   In 2021, he was admitted for Tikosyn initiation.   I have not seen him in the office for over a year.  He feels like he has stayed in NSR on Tikosyn.  He is in NSR today.  He is no longer taking Repatha (says he got out of the habit).  He has been taking Lasix every other day.  No dyspnea walking on flat ground.  Goes to the driving range to hit golf balls twice a week.  No chest pain/tightness. No claudication. Weight is stable. Main complaint is difficulty sleeping at night and daytime sleepiness.   ECG: NSR, inferolateral TWIs, QTc 435 msec  Labs (5/15): K 4.3, creatinine 0.89 Labs (6/15): K 4.3, creatinine 1.4, BNP 112 Labs (  7/15): K 4.4, creatinine 1.2, HCT 40.2 Labs (8/15): K 4, creatinine 1.2, BNP 113, LFTs and TSH normal Labs (10/15): K 4.4, creatinine 1.4 Labs (5/16): K 4.7, creatinine 1.18, HDL 52, LDL 98 Labs (6/16): LDL 118, LFTs normal Labs (1/17): HCT 40.2, K 3.7, creatinine 1.04 Labs (1/19): K 4.9, creatinine 1.19 Labs (2/19): K 4.2, creatinine 0.89 Labs (3/19): LDL 86, HDL 45, TGs 245 Labs (5/19): LDL 110, HDL 39 Labs (6/19): K 4.1, creatinine 1.07 Labs (11/19): LDL 14, HDL 56 Labs (6/20): K 4.1, creatinine 1.0 Labs (7/20): K 4.3, creatinine 1.07  PMH: 1. PAD: Peripheral arterial dopplers in 2012 showed > 75% bilateral SFA  stenosis. Peripheral arterial dopplers in 10/14 showed > 50% focal bilateral SFA stenoses.  2. AAA: [place] 4/15 with 4.4 cm AAA but concern for penetrating ulcers. CTA abdomen showed 4.4 cm AAA with penetrating ulcers and possible pseudoaneurysms. Now s/p endovascular repair of AAA in 9/37 with no complications.  3. Carotid stenosis: Carotid dopplers (4/15) with 60-79% bilateral ICA stenosis. Carotid dopplers (11/15) with 60-79% bilateral ICA stenosis.  Carotid dopplers 6/16 with 60-79% bilateral ICA stenosis. - Carotid dopplers (1/18): 60-79% RICA stenosis.  - Carotid dopplers (1/19): 80-99% RICA stenosis => Right CEA in 2/19.  - Carotid dopplers (11/19): 40-59% bilateral stenosis.  - Carotid dopplers (1/21): 1-39% BICA stenosis.  4. CAD: CABG 1989 with LIMA-LAD, SVG-D, seq SVG-ramus and OM, sequential SVG-PDA/PLV. SVG-PDA/PLV found to be occluded on cath prior to AVR, so patient had SVG-PDA with AVR in 2007. LHC (7/15) with LIMA-LAD patent with 40-50% stenosis in LAD after touchdown, sequential SVG-ramus/OM/diagonal with only the ramus branch still intact (known from prior cath), SVG-PDA from original surgery TO, SVG-PDA from redo surgery with long 50-60% mid-graft stenosis.  No target for intervention.  5. Severe aortic stenosis: Bioprosthetic AVR in 2007.  6. Atrial fibrillation: Paroxysmal. Initially noted after cardiac surgery, then again after AAA repair.  DCCV to NSR 06/06/14.  DCCV to NSR again 08/21/14.  - DCCV to NSR in 1/19.  - DCCV to NSR in 6/20.  - Now on Tikosyn.  7. Type II diabetes  8. Gout  9. GERD  10. Hyperlipidemia  11. HTN 12. GERD  13. Chronic systolic CHF: Echo (1/69) with EF worsened to 25-30% with grade II diastolic dysfunction, stable bioprosthetic aortic valve, mildly dilated RV with normal systolic function.  Echo (1/16) with EF 55-60%, basal inferior hypokinesis, mild LVH, bioprosthetic aortic valve with mean gradient 13 mmHg, mild Nathaniel, severe LAE.  - Echo (1/19): EF  45-50%, mild Nathaniel, normal bioprosthetic aortic valve.  - TEE (6/20): EF 50% with mild diffuse hypokinesis, mildly decreased RV systolic function with mild RV enlargement, bioprosthetic aortic valve looked ok, mild Nathaniel.  14. BPH  15. Lung nodules: Followed by pulmonary.   SH: Widower, 2 children, raised his 3 grand-daughters, lives in [place], [place].  Occasional ETOH, no smoking.   FH: Father died from ruptured AAA.   ROS: All systems reviewed and negative except as per HPI.   Current Outpatient Medications  Medication Sig Dispense Refill  . allopurinol (ZYLOPRIM) 300 MG tablet Take 300 mg by mouth daily.   2  . atorvastatin (LIPITOR) 10 MG tablet TAKE ONE TABLET DAILY 30 tablet 10  . atorvastatin (LIPITOR) 20 MG tablet TAKE ONE TABLET EVERY OTHER DAY 30 tablet 0  . Coenzyme Q10 (CO Q10) 100 MG CAPS Take 100 mg by mouth daily.    Marland Kitchen dofetilide (TIKOSYN) 250 MCG capsule Take 1 capsule (  250 mcg total) by mouth 2 (two) times daily. appt required for refill [phone] 60 capsule 0  . doxazosin (CARDURA) 1 MG tablet Take 1 mg by mouth at bedtime. Also take 2 mg tablet every morning    . doxazosin (CARDURA) 2 MG tablet Take 2 mg by mouth every morning. Also take 1 mg tablet every night    . empagliflozin (JARDIANCE) 10 MG TABS tablet Take 10 mg by mouth at bedtime.     . Evolocumab (REPATHA SURECLICK) 161 MG/ML SOAJ Inject 1 pen into the skin every 14 (fourteen) days. 2 pen 11  . ezetimibe (ZETIA) 10 MG tablet Take 10 mg by mouth at bedtime.     . finasteride (PROSCAR) 5 MG tablet Take 1 tablet (5 mg total) by mouth at bedtime. 90 tablet 3  . furosemide (LASIX) 20 MG tablet TAKE TWO TABLETS EVERY DAY 60 tablet 3  . furosemide (LASIX) 40 MG tablet Take 40 mg by mouth See admin instructions. Take one tablet (40 mg) by mouth instead of 20 mg tablet every few days.    . Glucosamine HCl-MSM (GLUCOSAMINE-MSM PO) Take 1 tablet by mouth daily.    Marland Kitchen latanoprost (XALATAN) 0.005 % ophthalmic solution  Place 1 drop into both eyes at bedtime.     Marland Kitchen loratadine-pseudoephedrine ([name] 24-HOUR) 10-240 MG 24 hr tablet Take 1 tablet by mouth daily as needed (seasonal allergies).     . Multiple Vitamins-Minerals (OCUVITE EXTRA PO) Take 1 tablet by mouth daily.    . ramipril (ALTACE) 2.5 MG capsule TAKE ONE CAPSULE EACH DAY WITH RAMIPRIL 5MG  TO EQUAL 7.5MG  90 capsule 0  . ramipril (ALTACE) 5 MG capsule TAKE ONE CAPSULE EACH DAY (Patient taking differently: Take 5 mg by mouth every morning. Also take 2.5 mg capsule every night) 90 capsule 3  . spironolactone (ALDACTONE) 25 MG tablet TAKE ONE TABLET EACH DAY 30 tablet 3  . tamsulosin (FLOMAX) 0.4 MG CAPS capsule Take 0.4 mg by mouth See admin instructions. Take one capsule (0.4 mg) in the morning, may take a second 0.4 mg at night as needed for urinary retention    . timolol (TIMOPTIC) 0.5 % ophthalmic solution Place 1 drop into both eyes daily.   5  . warfarin (COUMADIN) 3 MG tablet TAKE 3 TABLETS DAILY EXCEPT 2 TABLETS ONMONDAY AND FRIDAY OR AS DIRECTED BY CLINIC 90 tablet 0   No current facility-administered medications for this encounter.    BP (!) 142/80 (BP Location: Left Arm, Patient Position: Sitting, Cuff Size: Normal)   Pulse 78   Ht 5\' 10"  (1.778 m)   Wt 88.3 kg (194 lb 9.6 oz)   SpO2 96%   BMI 27.92 kg/m   General: NAD Neck: JVP 7-8 cm, no thyromegaly or thyroid nodule.  Lungs: Clear to auscultation bilaterally with normal respiratory effort. CV: Nondisplaced PMI.  Heart regular S1/S2, no S3/S4, 2/6 SEM RUSB.  Trace ankle edema.  No carotid bruit.  Normal pedal pulses.  Abdomen: Soft, nontender, no hepatosplenomegaly, no distention.  Skin: Intact without lesions or rashes.  Neurologic: Alert and oriented x 3.  Psych: Normal affect. Extremities: No clubbing or cyanosis.  HEENT: Normal.   Assessment/Plan:  1. CAD: s/p CABG and redo CABG.  Given fall in EF, I did a cardiac cath in 7/15 as documented above. Unfortunately, despite  significant coronary disease, there were no good interventional targets.  No recent chest pain.  - Continue atorvastatin, Zetia.  I will refer back to lipid clinic to  restart Repatha.   - He is on warfarin in setting of stable CAD so does not need ASA 81.   2. Hyperlipidemia: He is taking atorvastatin + Zetia.  - Restart Repatha.  3. PAD: Patient denies claudication or foot ulcers. Peripheral arterial dopplers showed > 50% bilateral focal SFA stenoses in the past.  He is not a cilostazol candidate with CHF. He is followed by Dr. Trula Slade at [facility].  4. Carotid stenosis: S/p right CEA in 2/19.  Followed by Dr. Trula Slade at [facility].  5. AAA: Successful endovascular repair in 5/15.  Followed by Dr. Trula Slade at [facility].  6. Bioprosthetic AVR: Valve stable on 6/20 TEE.  7. Atrial fibrillation: Possible CNS side effects with amiodarone.  He has not tolerated beta blockers due to profound fatigue.  Given worsening of CHF with atrial fibrillation and difficulty with rate control, I think that we need to maintain NSR.  He is now remaining in NSR on Tikosyn.  QTc on ECG ok today.  - Continue Tikosyn, check BMET and Mg today.  - Continue warfarin.  8. Chronic primarily diastolic CHF: Suspect ischemic cardiomyopathy.  EF 25-30% in 6/15 but had normalized to 55-60% by 1/16.  Down to 45-50% on 1/19 echo but was in the setting of atrial fibrillation.  TEE in 6/20 showed EF 50%.  Not significantly volume overloaded, NYHA class II symptoms.  - Continue Lasix at 40 mg qod, BMET today.  - Continue ramipril 5 qam/2.5 qpm.  - Continue spironolactone 25 mg daily.  - I will arrange for repeat echo.  9. HTN: BP generally controlled.    10. Diabetes: He is on Jardiance.   11. OSA: Daytime sleepiness and fatigue.   - I will arrange for sleep study.   Followup in 3 months with echo.   [name] 09/10/2020

## 2020-09-10 NOTE — Progress Notes (Addendum)
Patient Name: Amarri Satterly        DOB: 4/24/12/1931      Height: 5'10    Weight: 194lb  Office Name: [facility]         Referring Provider: Dr [name]  Today's Date: 09/10/2020  Date:   STOP BANG RISK ASSESSMENT S (snore) Have you been told that you snore?     YES   T (tired) Are you often tired, fatigued, or sleepy during the day?   YES  O (obstruction) Do you stop breathing, choke, or gasp during sleep? NO   P (pressure) Do you have or are you being treated for high blood pressure? NO   B (BMI) Is your body index greater than 35 kg/m? NO   A (age) Are you 34 years old or older? YES   N (neck) Do you have a neck circumference greater than 16 inches?   NA   G (gender) Are you a male? YES   TOTAL STOP/BANG YES ANSWERS 4                                                                       For Office Use Only              Procedure Order Form    YES to 3+ Stop Bang questions OR two clinical symptoms - patient qualifies for [employer] (CPT 50932)     Submit: This Form + Patient Face Sheet + Clinical Note via [facility] or Fax: [phone]

## 2020-09-11 ENCOUNTER — Telehealth (HOSPITAL_COMMUNITY): Payer: Self-pay

## 2020-09-11 ENCOUNTER — Telehealth: Payer: Self-pay | Admitting: Pharmacist

## 2020-09-11 MED ORDER — REPATHA SURECLICK 140 MG/ML ~~LOC~~ SOAJ: 1.0000 "pen " | SUBCUTANEOUS | 11 refills | Status: DC

## 2020-09-11 MED ORDER — ATORVASTATIN CALCIUM 20 MG PO TABS
20.0000 mg | ORAL_TABLET | Freq: Every day | ORAL | 11 refills | Status: DC
Start: 2020-09-11 — End: 2021-01-23

## 2020-09-11 NOTE — Telephone Encounter (Signed)
Malena Edman, RN  09/11/2020 10:14 AM EDT Back to Top    Patient advised and verbalized understanding. Message sent to [facility]

## 2020-09-11 NOTE — Telephone Encounter (Signed)
LDL has increased from 14 to 129. Called pt who states he stopped taking his Repatha injections because he did not like taking so many medications. Discussed the importance of Repatha and he is willing to resume. He is still taking ezetimibe 10mg  daily and atorvastatin 20mg  or 30mg  on alternating days.  Refill for Repatha sent in to pharmacy and confirmed copay $30. Advised pt he can change atorvastatin to 20mg  every day for ease of dosing, he will also continue on ezetimibe. Pt verbalized understanding.

## 2020-09-11 NOTE — Telephone Encounter (Signed)
-----   Message from Larey Dresser, MD sent at 09/10/2020  2:01 PM EDT ----- Mr Masi needs to restart Repatha.  Please contact lipid clinic to get him back on this med.

## 2020-09-12 ENCOUNTER — Ambulatory Visit: Payer: Medicare Other

## 2020-09-19 DIAGNOSIS — Z23 Encounter for immunization: Secondary | ICD-10-CM | POA: Diagnosis not present

## 2020-09-20 ENCOUNTER — Ambulatory Visit (INDEPENDENT_AMBULATORY_CARE_PROVIDER_SITE_OTHER): Payer: Medicare Other | Admitting: Pharmacist

## 2020-09-20 ENCOUNTER — Other Ambulatory Visit: Payer: Self-pay

## 2020-09-20 DIAGNOSIS — Z5181 Encounter for therapeutic drug level monitoring: Secondary | ICD-10-CM

## 2020-09-20 DIAGNOSIS — I4891 Unspecified atrial fibrillation: Secondary | ICD-10-CM | POA: Diagnosis not present

## 2020-09-20 LAB — POCT INR: INR: 2.1 (ref 2.0–3.0)

## 2020-09-20 NOTE — Patient Instructions (Signed)
Description   Continue taking 3 tablets daily. Recheck INR in 4 weeks.  Call us with any new medications or concerns: [facility] # [phone], Main # [phone].

## 2020-10-01 DIAGNOSIS — H35373 Puckering of macula, bilateral: Secondary | ICD-10-CM | POA: Diagnosis not present

## 2020-10-01 DIAGNOSIS — Z961 Presence of intraocular lens: Secondary | ICD-10-CM | POA: Diagnosis not present

## 2020-10-01 DIAGNOSIS — H401122 Primary open-angle glaucoma, left eye, moderate stage: Secondary | ICD-10-CM | POA: Diagnosis not present

## 2020-10-04 ENCOUNTER — Other Ambulatory Visit (HOSPITAL_COMMUNITY): Payer: Self-pay | Admitting: Cardiology

## 2020-10-14 ENCOUNTER — Ambulatory Visit (HOSPITAL_COMMUNITY)
Admission: RE | Admit: 2020-10-14 | Discharge: 2020-10-14 | Disposition: A | Discharge status: Home / Self Care | Payer: Medicare Other | Source: Ambulatory Visit | Attending: Cardiology | Admitting: Cardiology

## 2020-10-14 ENCOUNTER — Other Ambulatory Visit: Payer: Self-pay

## 2020-10-14 VITALS — BP 142/68 | HR 78 | Wt 199.0 lb

## 2020-10-14 DIAGNOSIS — I714 Abdominal aortic aneurysm, without rupture: Secondary | ICD-10-CM | POA: Diagnosis not present

## 2020-10-14 DIAGNOSIS — Z951 Presence of aortocoronary bypass graft: Secondary | ICD-10-CM | POA: Diagnosis not present

## 2020-10-14 DIAGNOSIS — I779 Disorder of arteries and arterioles, unspecified: Secondary | ICD-10-CM | POA: Diagnosis not present

## 2020-10-14 DIAGNOSIS — Z8249 Family history of ischemic heart disease and other diseases of the circulatory system: Secondary | ICD-10-CM | POA: Diagnosis not present

## 2020-10-14 DIAGNOSIS — I480 Paroxysmal atrial fibrillation: Secondary | ICD-10-CM | POA: Diagnosis not present

## 2020-10-14 DIAGNOSIS — E785 Hyperlipidemia, unspecified: Secondary | ICD-10-CM | POA: Diagnosis not present

## 2020-10-14 DIAGNOSIS — I5032 Chronic diastolic (congestive) heart failure: Secondary | ICD-10-CM

## 2020-10-14 DIAGNOSIS — I110 Hypertensive heart disease with heart failure: Secondary | ICD-10-CM | POA: Insufficient documentation

## 2020-10-14 DIAGNOSIS — I5042 Chronic combined systolic (congestive) and diastolic (congestive) heart failure: Secondary | ICD-10-CM | POA: Diagnosis not present

## 2020-10-14 DIAGNOSIS — E119 Type 2 diabetes mellitus without complications: Secondary | ICD-10-CM | POA: Diagnosis not present

## 2020-10-14 DIAGNOSIS — Z953 Presence of xenogenic heart valve: Secondary | ICD-10-CM | POA: Diagnosis not present

## 2020-10-14 DIAGNOSIS — Z7901 Long term (current) use of anticoagulants: Secondary | ICD-10-CM | POA: Diagnosis not present

## 2020-10-14 DIAGNOSIS — I2510 Atherosclerotic heart disease of native coronary artery without angina pectoris: Secondary | ICD-10-CM | POA: Insufficient documentation

## 2020-10-14 DIAGNOSIS — I6523 Occlusion and stenosis of bilateral carotid arteries: Secondary | ICD-10-CM | POA: Insufficient documentation

## 2020-10-14 DIAGNOSIS — Z79899 Other long term (current) drug therapy: Secondary | ICD-10-CM | POA: Insufficient documentation

## 2020-10-14 NOTE — Progress Notes (Addendum)
Pt walked in c/o "hearing heart beat in ear" and SOB, feels he may be in a-fib.  VS stable, wt is up 5 lbs since 9/28, pt does not weight at home, EKG obtained shows NSR HR 72, ReDS 35%  Discussed w/Dr Aundra Dubin and he then came in to see pt.    ReDS Vest / Clip - 10/14/20 1200      ReDS Vest / Clip   Station Marker C    Ruler Value 29    ReDS Value Range Low volume    ReDS Actual Value 35           Orders placed per Dr Aundra Dubin, AVS completed and reviewed with pt.  ITAMAR home sleep study given to patient, all instructions explained, and CLOU[facility]AT registration complete.

## 2020-10-14 NOTE — Progress Notes (Signed)
Patient ID: Zahid Carneiro., male   DOB: 08-Dec-1932, 86 y.o.   MRN: [id number] PCP: Dr. Elyse Hsu Cardiology: Dr. Aundra Dubin  86 y.o. with history of extensive vascular disease including CAD s/p CABG and redo CABG, AAA s/p repair, PAD, and carotid stenosis as well as prior AVR presents for followup of CAD and atrial fibrillation.   He has had documentation of significant peripheral arterial disease.  He gets bilateral calf soreness after walking for about 15 minutes.  This resolves with rest, no rest pain.  No pedal ulcers, though when he gets cuts on his feet, they heal slowly.  Peripheral arterial dopplers done in 10/14 showed > 50% focal SFA stenoses bilaterally.  He has carotid artery disease.  No stroke-like symptoms.  He had a endovascular AAA repair in 5/15.    After his AAA repair, he was noted to be in atrial fibrillation.  He had had a prior episode of atrial fibrillation after his cardiac surgery in 2007.  Atrial fibrillation was rate-controlled.  He was started on warfarin and Toprol XL.  He developed exertional dyspnea and fatigue in atrial fibrillation. I cardioverted him back to NSR on 06/06/14.  I also had him get an echocardiogram in 6/15.  This showed EF worsened to 25-30% with stable bioprosthetic aortic valve, mildly dilated RV with normal systolic function.  Given the fall in EF, I took him for [facility] in 7/15.  This showed stable anatomy.  The LIMA-LAD was patent, the sequential SVG-OM/ramus/D was patent only to ramus but this is the same as the prior cath, and the SVG-PDA was patent with a long 50-60% mid-graft stenosis.  No intervention.  Of note, at cath he was noted to be back in atrial fibrillation.  I then started him on amiodarone.  I took Mr Weida for [place] again on 08/21/14.  This was successful.  He has been off amiodarone due to side effects (hallucinations).  Echo in 1/16 showed recovery of EF to 55-60%.    He was admitted in 1/19 with bronchitis/wheezing/dyspnea and was found to be in  atrial fibrillation with RVR.  He tested positive for metapneumovirus.  Echo showed EF 45-50% with normal bioprosthetic aortic valve. He was diuresed for volume overload while in the hospital. He had DCCV to NSR.    Carotid dopplers in 1/19 showed 80-99% RICA stenosis, he had right CEA in 9/02 with no complications.   At appointment in early 6/20, Mr Meegan was noted to be back in atrial fibrillation with RVR and mild CHF.  He was started on Toprol XL and Lasix was increased.  He had TEE-guided DCCV to NSR in 6/20.  TEE showed EF 50%, mild diffuse hypokinesis, mildly decreased RV systolic function, normally functioning bioprosthetic aortic valve.   In 2021, he was admitted for Tikosyn initiation.   Patient presents today for a work-in visit.  He has had palpitations and has been "hearing my heart beat" in his right ear.  No chest pain.  Remains active, playing some golf.  No dyspnea walking on flat ground, no orthopnea/PND.  Also reports falling asleep easily during the day/significant daytime sleepiness. LDL high in 9/21 but had been off Repatha, has restarted.   REDS clip 35%  ECG: NSR, inferolateral ST changes, QTc 462 msec  Labs (5/15): K 4.3, creatinine 0.89 Labs (6/15): K 4.3, creatinine 1.4, BNP 112 Labs (7/15): K 4.4, creatinine 1.2, HCT 40.2 Labs (8/15): K 4, creatinine 1.2, BNP 113, LFTs and TSH normal Labs (10/15): K  4.4, creatinine 1.4 Labs (5/16): K 4.7, creatinine 1.18, HDL 52, LDL 98 Labs (6/16): LDL 118, LFTs normal Labs (1/17): HCT 40.2, K 3.7, creatinine 1.04 Labs (1/19): K 4.9, creatinine 1.19 Labs (2/19): K 4.2, creatinine 0.89 Labs (3/19): LDL 86, HDL 45, TGs 245 Labs (5/19): LDL 110, HDL 39 Labs (6/19): K 4.1, creatinine 1.07 Labs (11/19): LDL 14, HDL 56 Labs (6/20): K 4.1, creatinine 1.0 Labs (7/20): K 4.3, creatinine 1.07 Labs (9/21): LDL 129, hgb 14.8, K 4.1, creatinine 1.02  PMH: 1. PAD: Peripheral arterial dopplers in 2012 showed > 75% bilateral SFA  stenosis. Peripheral arterial dopplers in 10/14 showed > 50% focal bilateral SFA stenoses.  2. AAA: [place] 4/15 with 4.4 cm AAA but concern for penetrating ulcers. CTA abdomen showed 4.4 cm AAA with penetrating ulcers and possible pseudoaneurysms. Now s/p endovascular repair of AAA in 3/32 with no complications.  3. Carotid stenosis: Carotid dopplers (4/15) with 60-79% bilateral ICA stenosis. Carotid dopplers (11/15) with 60-79% bilateral ICA stenosis.  Carotid dopplers 6/16 with 60-79% bilateral ICA stenosis. - Carotid dopplers (1/18): 60-79% RICA stenosis.  - Carotid dopplers (1/19): 80-99% RICA stenosis => Right CEA in 2/19.  - Carotid dopplers (11/19): 40-59% bilateral stenosis.  - Carotid dopplers (1/21): 1-39% BICA stenosis.  4. CAD: CABG 1989 with LIMA-LAD, SVG-D, seq SVG-ramus and OM, sequential SVG-PDA/PLV. SVG-PDA/PLV found to be occluded on cath prior to AVR, so patient had SVG-PDA with AVR in 2007. LHC (7/15) with LIMA-LAD patent with 40-50% stenosis in LAD after touchdown, sequential SVG-ramus/OM/diagonal with only the ramus branch still intact (known from prior cath), SVG-PDA from original surgery TO, SVG-PDA from redo surgery with long 50-60% mid-graft stenosis.  No target for intervention.  5. Severe aortic stenosis: Bioprosthetic AVR in 2007.  6. Atrial fibrillation: Paroxysmal. Initially noted after cardiac surgery, then again after AAA repair.  DCCV to NSR 06/06/14.  DCCV to NSR again 08/21/14.  - DCCV to NSR in 1/19.  - DCCV to NSR in 6/20.  - Now on Tikosyn.  7. Type II diabetes  8. Gout  9. GERD  10. Hyperlipidemia  11. HTN 12. GERD  13. Chronic systolic CHF: Echo (9/51) with EF worsened to 25-30% with grade II diastolic dysfunction, stable bioprosthetic aortic valve, mildly dilated RV with normal systolic function.  Echo (1/16) with EF 55-60%, basal inferior hypokinesis, mild LVH, bioprosthetic aortic valve with mean gradient 13 mmHg, mild MR, severe LAE.  - Echo (1/19): EF  45-50%, mild MR, normal bioprosthetic aortic valve.  - TEE (6/20): EF 50% with mild diffuse hypokinesis, mildly decreased RV systolic function with mild RV enlargement, bioprosthetic aortic valve looked ok, mild MR.  14. BPH  15. Lung nodules: Followed by pulmonary.   SH: Widower, 2 children, raised his 3 grand-daughters, lives in [place], [place].  Occasional ETOH, no smoking.   FH: Father died from ruptured AAA.   ROS: All systems reviewed and negative except as per HPI.   Current Outpatient Medications  Medication Sig Dispense Refill  . allopurinol (ZYLOPRIM) 300 MG tablet Take 300 mg by mouth daily.   2  . atorvastatin (LIPITOR) 20 MG tablet Take 1 tablet (20 mg total) by mouth daily. 30 tablet 11  . Coenzyme Q10 (CO Q10) 100 MG CAPS Take 100 mg by mouth daily.    Marland Kitchen dofetilide (TIKOSYN) 250 MCG capsule Take 1 capsule (250 mcg total) by mouth 2 (two) times daily. appt required for refill [phone] 60 capsule 0  . doxazosin (CARDURA) 1 MG  tablet Take 1 mg by mouth at bedtime. Also take 2 mg tablet every morning    . doxazosin (CARDURA) 2 MG tablet Take 2 mg by mouth every morning. Also take 1 mg tablet every night    . empagliflozin (JARDIANCE) 10 MG TABS tablet Take 10 mg by mouth at bedtime.     . Evolocumab (REPATHA SURECLICK) 497 MG/ML SOAJ Inject 1 pen into the skin every 14 (fourteen) days. 2 mL 11  . ezetimibe (ZETIA) 10 MG tablet Take 10 mg by mouth at bedtime.     . finasteride (PROSCAR) 5 MG tablet Take 1 tablet (5 mg total) by mouth at bedtime. 90 tablet 3  . furosemide (LASIX) 20 MG tablet Take 20 mg by mouth daily.    . Glucosamine HCl-MSM (GLUCOSAMINE-MSM PO) Take 1 tablet by mouth daily.    Marland Kitchen latanoprost (XALATAN) 0.005 % ophthalmic solution Place 1 drop into both eyes at bedtime.     Marland Kitchen loratadine-pseudoephedrine ([name] 24-HOUR) 10-240 MG 24 hr tablet Take 1 tablet by mouth daily as needed (seasonal allergies).     . Multiple Vitamins-Minerals (OCUVITE EXTRA  PO) Take 1 tablet by mouth daily.    . ramipril (ALTACE) 2.5 MG capsule TAKE ONE CAPSULE EACH DAY WITH RAMIPRIL 5MG  TO EQUAL 7.5MG  90 capsule 0  . ramipril (ALTACE) 5 MG capsule TAKE ONE CAPSULE EACH DAY 90 capsule 3  . spironolactone (ALDACTONE) 25 MG tablet TAKE ONE TABLET EACH DAY 30 tablet 3  . tamsulosin (FLOMAX) 0.4 MG CAPS capsule Take 0.4 mg by mouth See admin instructions. Take one capsule (0.4 mg) in the morning, may take a second 0.4 mg at night as needed for urinary retention    . timolol (TIMOPTIC) 0.5 % ophthalmic solution Place 1 drop into both eyes daily.   5  . warfarin (COUMADIN) 3 MG tablet TAKE 3 TABLETS DAILY EXCEPT 2 TABLETS ONMONDAY AND FRIDAY OR AS DIRECTED BY CLINIC 90 tablet 0   No current facility-administered medications for this encounter.    BP (!) 142/68   Pulse 78   Wt 90.3 kg (199 lb)   SpO2 96%   BMI 28.55 kg/m   General: NAD Neck: JVP 8 cm, no thyromegaly or thyroid nodule.  Lungs: Clear to auscultation bilaterally with normal respiratory effort. CV: Nondisplaced PMI.  Heart regular S1/S2, no S3/S4, 2/6 SEM RUSB.  Trace ankle edema.  No carotid bruit.  Normal pedal pulses.  Abdomen: Soft, nontender, no hepatosplenomegaly, no distention.  Skin: Intact without lesions or rashes.  Neurologic: Alert and oriented x 3.  Psych: Normal affect. Extremities: No clubbing or cyanosis.  HEENT: Normal.   Assessment/Plan:  1. CAD: s/p CABG and redo CABG.  Given fall in EF, I did a cardiac cath in 7/15 as documented above. Unfortunately, despite significant coronary disease, there were no good interventional targets.  No recent chest pain.  - Continue atorvastatin, Zetia.  LDL was high in 9/21 but off Repatha, has now restarted.  Repeat lipids next appt.  - He is on warfarin in setting of stable CAD so does not need ASA 81.   2. Hyperlipidemia: He is taking atorvastatin + Zetia and has restarted Repatha.  - Lipids next appt.  3. PAD: Patient denies claudication or  foot ulcers. Peripheral arterial dopplers showed > 50% bilateral focal SFA stenoses in the past.  He is not a cilostazol candidate with CHF. He is followed by Dr. Trula Slade at [facility].  4. Carotid stenosis: S/p right CEA in  2/19.  Followed by Dr. Trula Slade at [facility]. He has been hearing his heart beat in his right ear.   - I will arrange for repeat carotid dopplers, ?if this is related to worsening carotid disease.  5. AAA: Successful endovascular repair in 5/15.  Followed by Dr. Trula Slade at [facility].  6. Bioprosthetic AVR: Valve stable on 6/20 TEE.  7. Atrial fibrillation: Possible CNS side effects with amiodarone.  He has not tolerated beta blockers due to profound fatigue.  Given worsening of CHF with atrial fibrillation and difficulty with rate control, I think that we need to maintain NSR.  He is now remaining in NSR on Tikosyn.  QTc on ECG ok today.  - Continue Tikosyn, recent BMET ok.   - Continue warfarin.  8. Chronic primarily diastolic CHF: Suspect ischemic cardiomyopathy.  EF 25-30% in 6/15 but had normalized to 55-60% by 1/16.  Down to 45-50% on 1/19 echo but was in the setting of atrial fibrillation.  TEE in 6/20 showed EF 50%.  NYHA class II symptoms. Mild volume overload with REDS clip 35%.  - Increase Lasix to 40 mg daily x 3 days then back to 20 mg daily.  Cut back on sodium intake.   - Continue ramipril 5 qam/2.5 qpm.  - Continue spironolactone 25 mg daily.  - He will need repeat echo at next appointment.  9. HTN: BP generally controlled.    10. Diabetes: He is on Jardiance.   11. OSA: Daytime sleepiness and fatigue, still awaiting sleep study.   - I will try again to arrange for sleep study, not done yet.   Followup as scheduled in 12/21 with echo.   [name] 10/14/2020

## 2020-10-14 NOTE — Patient Instructions (Addendum)
Increase Furosemide to 40 mg Daily for 3 days, if not feeling better by the end of the week please call us  Your physician has requested that you have a carotid duplex. This test is an ultrasound of the carotid arteries in your neck. It looks at blood flow through these arteries that supply the brain with blood. Allow one hour for this exam. There are no restrictions or special instructions. [facility] WILL CALL YOU TO SCHEDULE THIS  Your provider has recommended that you have a home sleep study.  We have provided you with the equipment in our office today. Please download the app and follow the instructions. YOUR PIN NUMBER IS: [id number]. Once you have completed the test you just dispose of the equipment, the information is automatically uploaded to [place] via blue-tooth technology. If your test is positive for sleep apnea and you need a home CPAP machine you will be contacted by Dr Theodosia Blender office [facility]) to set this up.  Keep follow up as scheduled  If you have any questions or concerns before your next appointment please send [place] a message through [place] or call our office at [phone].    TO LEAVE A MESSAGE FOR THE NURSE SELECT OPTION 2, PLEASE LEAVE A MESSAGE INCLUDING: . YOUR NAME . DATE OF BIRTH . CALL BACK NUMBER . REASON FOR CALL**this is important as we prioritize the call backs  YOU WILL RECEIVE A CALL BACK THE SAME DAY AS LONG AS YOU CALL BEFORE 4:00 PM

## 2020-10-18 ENCOUNTER — Ambulatory Visit (INDEPENDENT_AMBULATORY_CARE_PROVIDER_SITE_OTHER): Payer: Medicare Other | Admitting: Pharmacist

## 2020-10-18 ENCOUNTER — Other Ambulatory Visit: Payer: Self-pay

## 2020-10-18 DIAGNOSIS — Z5181 Encounter for therapeutic drug level monitoring: Secondary | ICD-10-CM | POA: Diagnosis not present

## 2020-10-18 DIAGNOSIS — I4891 Unspecified atrial fibrillation: Secondary | ICD-10-CM | POA: Diagnosis not present

## 2020-10-18 LAB — POCT INR: INR: 3.7 — AB (ref 2.0–3.0)

## 2020-10-18 NOTE — Patient Instructions (Signed)
Skip your coumadin tomorrow then continue taking 3 tablets daily. Recheck INR in 3 weeks.  Call us with any new medications or concerns: [facility] # [phone], Main # [phone].

## 2020-11-06 ENCOUNTER — Ambulatory Visit (HOSPITAL_COMMUNITY)
Admission: RE | Admit: 2020-11-06 | Discharge: 2020-11-06 | Disposition: A | Discharge status: Home / Self Care | Payer: Medicare Other | Source: Ambulatory Visit | Attending: Cardiology | Admitting: Cardiology

## 2020-11-06 ENCOUNTER — Other Ambulatory Visit: Payer: Self-pay

## 2020-11-06 ENCOUNTER — Ambulatory Visit (INDEPENDENT_AMBULATORY_CARE_PROVIDER_SITE_OTHER): Payer: Medicare Other | Admitting: *Deleted

## 2020-11-06 DIAGNOSIS — I4891 Unspecified atrial fibrillation: Secondary | ICD-10-CM | POA: Diagnosis not present

## 2020-11-06 DIAGNOSIS — I779 Disorder of arteries and arterioles, unspecified: Secondary | ICD-10-CM | POA: Insufficient documentation

## 2020-11-06 LAB — POCT INR: INR: 2.9 (ref 2.0–3.0)

## 2020-11-06 NOTE — Patient Instructions (Signed)
Description   Continue taking 3 tablets daily. Recheck INR in 4  weeks.  Call us with any new medications or concerns: [facility] # [phone], Main # [phone].

## 2020-11-15 ENCOUNTER — Telehealth (HOSPITAL_COMMUNITY): Payer: Self-pay

## 2020-11-15 NOTE — Telephone Encounter (Signed)
Malena Edman, RN  11/15/2020 10:13 AM EST Back to Top    Patient advised and verbalized understanding. Wants to ask questions at appt on 12/17 about the follow up   Valeda Malm, RN  11/13/2020 9:55 AM EST     LM for patient to return call to discuss results.

## 2020-11-15 NOTE — Telephone Encounter (Signed)
-----   Message from Larey Dresser, MD sent at 11/10/2020 11:24 AM EST ----- Internal carotids without significant stenosis but >50% common carotid stenosis on left.  Would make sure he has followup with vascular surgery (has seen in past).

## 2020-11-16 ENCOUNTER — Other Ambulatory Visit (HOSPITAL_COMMUNITY): Payer: Self-pay | Admitting: Cardiology

## 2020-11-22 DIAGNOSIS — E1159 Type 2 diabetes mellitus with other circulatory complications: Secondary | ICD-10-CM | POA: Diagnosis not present

## 2020-11-22 DIAGNOSIS — I10 Essential (primary) hypertension: Secondary | ICD-10-CM | POA: Diagnosis not present

## 2020-11-22 DIAGNOSIS — E782 Mixed hyperlipidemia: Secondary | ICD-10-CM | POA: Diagnosis not present

## 2020-11-22 DIAGNOSIS — E1151 Type 2 diabetes mellitus with diabetic peripheral angiopathy without gangrene: Secondary | ICD-10-CM | POA: Diagnosis not present

## 2020-11-22 DIAGNOSIS — I2510 Atherosclerotic heart disease of native coronary artery without angina pectoris: Secondary | ICD-10-CM | POA: Diagnosis not present

## 2020-11-22 DIAGNOSIS — M109 Gout, unspecified: Secondary | ICD-10-CM | POA: Diagnosis not present

## 2020-11-22 DIAGNOSIS — E1165 Type 2 diabetes mellitus with hyperglycemia: Secondary | ICD-10-CM | POA: Diagnosis not present

## 2020-11-29 ENCOUNTER — Ambulatory Visit (HOSPITAL_COMMUNITY)
Admission: RE | Admit: 2020-11-29 | Discharge: 2020-11-29 | Disposition: A | Discharge status: Home / Self Care | Payer: Medicare Other | Source: Ambulatory Visit | Attending: Cardiology | Admitting: Cardiology

## 2020-11-29 ENCOUNTER — Encounter (HOSPITAL_COMMUNITY): Payer: Self-pay | Admitting: Cardiology

## 2020-11-29 ENCOUNTER — Ambulatory Visit (HOSPITAL_BASED_OUTPATIENT_CLINIC_OR_DEPARTMENT_OTHER)
Admission: RE | Admit: 2020-11-29 | Discharge: 2020-11-29 | Disposition: A | Discharge status: Home / Self Care | Payer: Medicare Other | Source: Ambulatory Visit | Attending: Cardiology | Admitting: Cardiology

## 2020-11-29 ENCOUNTER — Other Ambulatory Visit: Payer: Self-pay

## 2020-11-29 ENCOUNTER — Ambulatory Visit (INDEPENDENT_AMBULATORY_CARE_PROVIDER_SITE_OTHER): Payer: Medicare Other | Admitting: Pharmacist

## 2020-11-29 VITALS — BP 130/80 | HR 91 | Wt 201.4 lb

## 2020-11-29 DIAGNOSIS — Z953 Presence of xenogenic heart valve: Secondary | ICD-10-CM | POA: Insufficient documentation

## 2020-11-29 DIAGNOSIS — E785 Hyperlipidemia, unspecified: Secondary | ICD-10-CM | POA: Diagnosis not present

## 2020-11-29 DIAGNOSIS — I779 Disorder of arteries and arterioles, unspecified: Secondary | ICD-10-CM | POA: Insufficient documentation

## 2020-11-29 DIAGNOSIS — Z5181 Encounter for therapeutic drug level monitoring: Secondary | ICD-10-CM

## 2020-11-29 DIAGNOSIS — I5022 Chronic systolic (congestive) heart failure: Secondary | ICD-10-CM

## 2020-11-29 DIAGNOSIS — Z7901 Long term (current) use of anticoagulants: Secondary | ICD-10-CM | POA: Diagnosis not present

## 2020-11-29 DIAGNOSIS — I480 Paroxysmal atrial fibrillation: Secondary | ICD-10-CM | POA: Insufficient documentation

## 2020-11-29 DIAGNOSIS — Z79899 Other long term (current) drug therapy: Secondary | ICD-10-CM | POA: Insufficient documentation

## 2020-11-29 DIAGNOSIS — Z8774 Personal history of (corrected) congenital malformations of heart and circulatory system: Secondary | ICD-10-CM | POA: Insufficient documentation

## 2020-11-29 DIAGNOSIS — I739 Peripheral vascular disease, unspecified: Secondary | ICD-10-CM | POA: Diagnosis not present

## 2020-11-29 DIAGNOSIS — I2510 Atherosclerotic heart disease of native coronary artery without angina pectoris: Secondary | ICD-10-CM | POA: Diagnosis not present

## 2020-11-29 DIAGNOSIS — Z7984 Long term (current) use of oral hypoglycemic drugs: Secondary | ICD-10-CM | POA: Insufficient documentation

## 2020-11-29 DIAGNOSIS — I4891 Unspecified atrial fibrillation: Secondary | ICD-10-CM | POA: Diagnosis not present

## 2020-11-29 DIAGNOSIS — I5042 Chronic combined systolic (congestive) and diastolic (congestive) heart failure: Secondary | ICD-10-CM | POA: Insufficient documentation

## 2020-11-29 DIAGNOSIS — I5032 Chronic diastolic (congestive) heart failure: Secondary | ICD-10-CM

## 2020-11-29 DIAGNOSIS — E119 Type 2 diabetes mellitus without complications: Secondary | ICD-10-CM | POA: Insufficient documentation

## 2020-11-29 DIAGNOSIS — I110 Hypertensive heart disease with heart failure: Secondary | ICD-10-CM | POA: Insufficient documentation

## 2020-11-29 DIAGNOSIS — Z87898 Personal history of other specified conditions: Secondary | ICD-10-CM | POA: Insufficient documentation

## 2020-11-29 DIAGNOSIS — Z951 Presence of aortocoronary bypass graft: Secondary | ICD-10-CM | POA: Insufficient documentation

## 2020-11-29 DIAGNOSIS — I482 Chronic atrial fibrillation, unspecified: Secondary | ICD-10-CM

## 2020-11-29 LAB — PROTIME-INR
INR: 4.9 (ref 0.8–1.2)
Prothrombin Time: 44.2 seconds — ABNORMAL HIGH (ref 11.4–15.2)

## 2020-11-29 LAB — BASIC METABOLIC PANEL
Anion gap: 8 (ref 5–15)
BUN: 16 mg/dL (ref 8–23)
CO2: 28 mmol/L (ref 22–32)
Calcium: 8.8 mg/dL — ABNORMAL LOW (ref 8.9–10.3)
Chloride: 100 mmol/L (ref 98–111)
Creatinine, Ser: 1.23 mg/dL (ref 0.61–1.24)
GFR, Estimated: 57 mL/min — ABNORMAL LOW (ref >60)
Glucose, Bld: 134 mg/dL — ABNORMAL HIGH (ref 70–99)
Potassium: 4.4 mmol/L (ref 3.5–5.1)
Sodium: 136 mmol/L (ref 135–145)

## 2020-11-29 LAB — LIPID PANEL
Cholesterol: 94 mg/dL (ref 0–200)
HDL: 47 mg/dL (ref >40)
LDL Cholesterol: 20 mg/dL (ref 0–99)
Total CHOL/HDL Ratio: 2 RATIO
Triglycerides: 133 mg/dL (ref <150)
VLDL: 27 mg/dL (ref 0–40)

## 2020-11-29 LAB — MAGNESIUM: Magnesium: 1.9 mg/dL (ref 1.7–2.4)

## 2020-11-29 MED ORDER — METOPROLOL SUCCINATE ER 25 MG PO TB24: 25.0000 mg | ORAL_TABLET | Freq: Every day | ORAL | 11 refills | Status: DC

## 2020-11-29 NOTE — Patient Instructions (Addendum)
Description   Spoke with patient over the phone.  Instructed to hold dose today and tomorrow then resume schedule of 9mg  daily.  [facility] on [facility] as well as with Coumadin clinic on Wednesday.  Advised if they take INR on Tuesday he does not need to see [place] on Wednesday.  Instructed patient to increase greens this weekend.  Call us with any new medications or concerns: [facility] # [phone], Main # [phone].

## 2020-11-29 NOTE — Patient Instructions (Addendum)
START Metoprolol 25 mg, one tab daily  Labs today We will only contact you if something comes back abnormal or we need to make some changes. Otherwise no news is good news!  Your physician recommends that you schedule a follow-up appointment in: 2-3 days for a nurse visit  Your physician recommends that you schedule a follow-up appointment in: 1 months with Dr Aundra Dubin with echo  Your physician has requested that you have an echocardiogram. Echocardiography is a painless test that uses sound waves to create images of your heart. It provides your doctor with information about the size and shape of your heart and how well your heart's chambers and valves are working. This procedure takes approximately one hour. There are no restrictions for this procedure.   You have been referred to [facility]-further evaluation of carotid disease  -They will be in contact for an appointment  If you have any questions or concerns before your next appointment please send [place] a message through [place] or call our office at [phone].    TO LEAVE A MESSAGE FOR THE NURSE SELECT OPTION 2, PLEASE LEAVE A MESSAGE INCLUDING: . YOUR NAME . DATE OF BIRTH . CALL BACK NUMBER . REASON FOR CALL**this is important as we prioritize the call backs  YOU WILL RECEIVE A CALL BACK THE SAME DAY AS LONG AS YOU CALL BEFORE 4:00 PM

## 2020-11-29 NOTE — H&P (View-Only) (Signed)
Patient ID: Nathaniel [name]., male   DOB: 05/04/1932, 86 y.o.   MRN: [id number] PCP: Dr. Elyse Hsu Cardiology: Dr. Aundra Dubin  86 y.o. with history of extensive vascular disease including CAD s/p CABG and redo CABG, AAA s/p repair, PAD, and carotid stenosis as well as prior AVR presents for followup of CAD and atrial fibrillation.   He has had documentation of significant peripheral arterial disease.  He gets bilateral calf soreness after walking for about 15 minutes.  This resolves with rest, no rest pain.  No pedal ulcers, though when he gets cuts on his feet, they heal slowly.  Peripheral arterial dopplers done in 10/14 showed > 50% focal SFA stenoses bilaterally.  He has carotid artery disease.  No stroke-like symptoms.  He had a endovascular AAA repair in 5/15.    After his AAA repair, he was noted to be in atrial fibrillation.  He had had a prior episode of atrial fibrillation after his cardiac surgery in 2007.  Atrial fibrillation was rate-controlled.  He was started on warfarin and Toprol XL.  He developed exertional dyspnea and fatigue in atrial fibrillation. I cardioverted him back to NSR on 06/06/14.  I also had him get an echocardiogram in 6/15.  This showed EF worsened to 25-30% with stable bioprosthetic aortic valve, mildly dilated RV with normal systolic function.  Given the fall in EF, I took him for [employer] [facility] in 7/15.  This showed stable anatomy.  The LIMA-LAD was patent, the sequential SVG-OM/ramus/D was patent only to ramus but this is the same as the prior cath, and the SVG-PDA was patent with a long 50-60% mid-graft stenosis.  No intervention.  Of note, at cath he was noted to be back in atrial fibrillation.  I then started him on amiodarone.  I took Mr Neyland for [place] again on 08/21/14.  This was successful.  He has been off amiodarone due to side effects (hallucinations).  Echo in 1/16 showed recovery of EF to 55-60%.    He was admitted in 1/19 with bronchitis/wheezing/dyspnea and was found to be in  atrial fibrillation with RVR.  He tested positive for metapneumovirus.  Echo showed EF 45-50% with normal bioprosthetic aortic valve. He was diuresed for volume overload while in the hospital. He had DCCV to NSR.    Carotid dopplers in 1/19 showed 80-99% RICA stenosis, he had right CEA in 9/37 with no complications.   At appointment in early 6/20, Mr Bollard was noted to be back in atrial fibrillation with RVR and mild CHF.  He was started on Toprol XL and Lasix was increased.  He had TEE-guided DCCV to NSR in 6/20.  TEE showed EF 50%, mild diffuse hypokinesis, mildly decreased RV systolic function, normally functioning bioprosthetic aortic valve.   In 2021, he was admitted for Tikosyn initiation.   Here today for follow up and found to be in rapid atrial fibrillation. He feels he just went out of rhythm before appointment today. Has some SOB today, able to walk about 50-75 yards before becoming winded. Prior to today has had no significant SOB. No CP, edema, PND/orthopnnea, or dizziness. Does not weigh at home.  Taking all medications.  Going to the driving range 1-2 times/week.  Looking at prior notes, he appears to have been in NSR at his 12/10 appt with Dr. Elyse Hsu.    ECG today 11/29/20: atrial fibrillation 122 bpm,  QTc 485 msec (personally reviewed).  Labs (5/15): K 4.3, creatinine 0.89 Labs (6/15): K 4.3, creatinine 1.4,  BNP 112 Labs (7/15): K 4.4, creatinine 1.2, HCT 40.2 Labs (8/15): K 4, creatinine 1.2, BNP 113, LFTs and TSH normal Labs (10/15): K 4.4, creatinine 1.4 Labs (5/16): K 4.7, creatinine 1.18, HDL 52, LDL 98 Labs (6/16): LDL 118, LFTs normal Labs (1/17): HCT 40.2, K 3.7, creatinine 1.04 Labs (1/19): K 4.9, creatinine 1.19 Labs (2/19): K 4.2, creatinine 0.89 Labs (3/19): LDL 86, HDL 45, TGs 245 Labs (5/19): LDL 110, HDL 39 Labs (6/19): K 4.1, creatinine 1.07 Labs (11/19): LDL 14, HDL 56 Labs (6/20): K 4.1, creatinine 1.0 Labs (7/20): K 4.3, creatinine 1.07 Labs (9/21):  LDL 129, hgb 14.8, K 4.1, creatinine 1.02  PMH: 1. PAD: Peripheral arterial dopplers in 2012 showed > 75% bilateral SFA stenosis. Peripheral arterial dopplers in 10/14 showed > 50% focal bilateral SFA stenoses.  2. AAA: [place] 4/15 with 4.4 cm AAA but concern for penetrating ulcers. CTA abdomen showed 4.4 cm AAA with penetrating ulcers and possible pseudoaneurysms. Now s/p endovascular repair of AAA in 7/82 with no complications.  3. Carotid stenosis: Carotid dopplers (4/15) with 60-79% bilateral ICA stenosis. Carotid dopplers (11/15) with 60-79% bilateral ICA stenosis.  Carotid dopplers 6/16 with 60-79% bilateral ICA stenosis. - Carotid dopplers (1/18): 60-79% RICA stenosis.  - Carotid dopplers (1/19): 80-99% RICA stenosis => Right CEA in 2/19.  - Carotid dopplers (11/19): 40-59% bilateral stenosis.  - Carotid dopplers (1/21): 1-39% BICA stenosis.  4. CAD: CABG 1989 with LIMA-LAD, SVG-D, seq SVG-ramus and OM, sequential SVG-PDA/PLV. SVG-PDA/PLV found to be occluded on cath prior to AVR, so patient had SVG-PDA with AVR in 2007. LHC (7/15) with LIMA-LAD patent with 40-50% stenosis in LAD after touchdown, sequential SVG-ramus/OM/diagonal with only the ramus branch still intact (known from prior cath), SVG-PDA from original surgery TO, SVG-PDA from redo surgery with long 50-60% mid-graft stenosis.  No target for intervention.  5. Severe aortic stenosis: Bioprosthetic AVR in 2007.  6. Atrial fibrillation: Paroxysmal. Initially noted after cardiac surgery, then again after AAA repair.  DCCV to NSR 06/06/14.  DCCV to NSR again 08/21/14.  - DCCV to NSR in 1/19.  - DCCV to NSR in 6/20.  - Now on Tikosyn.  7. Type II diabetes  8. Gout  9. GERD  10. Hyperlipidemia  11. HTN 12. GERD  13. Chronic systolic CHF: Echo (9/56) with EF worsened to 25-30% with grade II diastolic dysfunction, stable bioprosthetic aortic valve, mildly dilated RV with normal systolic function.  Echo (1/16) with EF 55-60%, basal inferior  hypokinesis, mild LVH, bioprosthetic aortic valve with mean gradient 13 mmHg, mild MR, severe LAE.  - Echo (1/19): EF 45-50%, mild MR, normal bioprosthetic aortic valve.  - TEE (6/20): EF 50% with mild diffuse hypokinesis, mildly decreased RV systolic function with mild RV enlargement, bioprosthetic aortic valve looked ok, mild MR.  14. BPH  15. Lung nodules: Followed by pulmonary.   SH: Widower, 2 children, raised his 3 grand-daughters, lives in [place], [place].  Occasional ETOH, no smoking.   FH: Father died from ruptured AAA.   ROS: All systems reviewed and negative except as per HPI.   Current Outpatient Medications  Medication Sig Dispense Refill  . allopurinol (ZYLOPRIM) 300 MG tablet Take 300 mg by mouth daily.   2  . atorvastatin (LIPITOR) 20 MG tablet Take 1 tablet (20 mg total) by mouth daily. 30 tablet 11  . Coenzyme Q10 (CO Q10) 100 MG CAPS Take 100 mg by mouth daily.    Marland Kitchen dofetilide (TIKOSYN) 250 MCG capsule Take  1 capsule (250 mcg total) by mouth 2 (two) times daily. appt required for refill [phone] 60 capsule 0  . doxazosin (CARDURA) 1 MG tablet Take 1 mg by mouth at bedtime. Also take 2 mg tablet every morning    . doxazosin (CARDURA) 2 MG tablet Take 2 mg by mouth every morning. Also take 1 mg tablet every night    . empagliflozin (JARDIANCE) 10 MG TABS tablet Take 10 mg by mouth at bedtime.     . Evolocumab (REPATHA SURECLICK) 283 MG/ML SOAJ Inject 1 pen into the skin every 14 (fourteen) days. 2 mL 11  . ezetimibe (ZETIA) 10 MG tablet Take 10 mg by mouth at bedtime.     . finasteride (PROSCAR) 5 MG tablet Take 1 tablet (5 mg total) by mouth at bedtime. 90 tablet 3  . furosemide (LASIX) 20 MG tablet Take 20 mg by mouth daily.    . Glucosamine HCl-MSM (GLUCOSAMINE-MSM PO) Take 1 tablet by mouth daily.    Marland Kitchen latanoprost (XALATAN) 0.005 % ophthalmic solution Place 1 drop into both eyes at bedtime.     Marland Kitchen loratadine-pseudoephedrine ([name] 24-HOUR) 10-240 MG 24  hr tablet Take 1 tablet by mouth daily as needed (seasonal allergies).     . Multiple Vitamins-Minerals (OCUVITE EXTRA PO) Take 1 tablet by mouth daily.    . ramipril (ALTACE) 2.5 MG capsule TAKE ONE CAPSULE EACH DAY WITH RAMIPRIL 5MG  TO EQUAL 7.5MG  90 capsule 0  . ramipril (ALTACE) 5 MG capsule TAKE ONE CAPSULE EACH DAY 90 capsule 3  . spironolactone (ALDACTONE) 25 MG tablet TAKE ONE TABLET DAILY 30 tablet 3  . tamsulosin (FLOMAX) 0.4 MG CAPS capsule Take 0.4 mg by mouth See admin instructions. Take one capsule (0.4 mg) in the morning, may take a second 0.4 mg at night as needed for urinary retention    . timolol (TIMOPTIC) 0.5 % ophthalmic solution Place 1 drop into both eyes daily.   5  . warfarin (COUMADIN) 3 MG tablet TAKE 3 TABLETS DAILY EXCEPT 2 TABLETS ONMONDAY AND FRIDAY OR AS DIRECTED BY CLINIC 90 tablet 0  . metoprolol succinate (TOPROL XL) 25 MG 24 hr tablet Take 1 tablet (25 mg total) by mouth daily. 30 tablet 11   No current facility-administered medications for this encounter.    BP 130/80   Pulse 91   Wt 91.4 kg (201 lb 6.4 oz)   SpO2 96%   BMI 28.90 kg/m    Wt Readings from Last 3 Encounters:  11/29/20 91.4 kg (201 lb 6.4 oz)  10/14/20 90.3 kg (199 lb)  09/10/20 88.3 kg (194 lb 9.6 oz)   PHYSICAL EXAM: General: NAD Neck: No JVD, no thyromegaly or thyroid nodule.  Lungs: Clear to auscultation bilaterally with normal respiratory effort. CV: Nondisplaced PMI.  Heart tachy, irregular S1/S2, no S3/S4, 2/6 SEM RUSB.  No peripheral edema.  No carotid bruit.  Normal pedal pulses.  Abdomen: Soft, nontender, no hepatosplenomegaly, no distention.  Skin: Intact without lesions or rashes.  Neurologic: Alert and oriented x 3.  Psych: Normal affect. Extremities: No clubbing or cyanosis.  HEENT: Normal.   Assessment/Plan:  1. CAD: s/p CABG and redo CABG.  Given fall in EF, I did a cardiac cath in 7/15 as documented above. Unfortunately, despite significant coronary disease,  there were no good interventional targets.  No recent chest pain.  - Continue atorvastatin, Zetia, and Repatha.  Check lipids today.  - He is on warfarin in setting of stable CAD so does  not need ASA 81.   2. Hyperlipidemia: He is taking atorvastatin + Zetia and has restarted Repatha.  - Lipids today.  3. PAD: Patient denies claudication or foot ulcers. Peripheral arterial dopplers showed > 50% bilateral focal SFA stenoses in the past.  He is not a cilostazol candidate with CHF. He is followed by Dr. Trula Slade at [facility].  - No current claudication symptoms. 4. Carotid stenosis: S/p right CEA in 2/19.  Followed by Dr. Trula Slade at [facility]. Carotid Dopplers completed 11/21-->bilateral ICA without significant stenosis but >50% common carotid stenosis on left. - Make sure he has followup with [facility].  5. AAA: Successful endovascular repair in 5/15.  Followed by Dr. Trula Slade at [facility].  6. Bioprosthetic AVR: Valve stable on 6/20 TEE.  7. Atrial fibrillation: Possible CNS side effects with amiodarone.  He has not tolerated beta blockers due to profound fatigue.  Given CHF with atrial fibrillation in the past, and difficulty with rate control, I think that we need to maintain NSR.  Appointment with PCP last week showed HR of 53, presumably sinus rhythm. He is now back in atrial fibrillation despite Tikosyn, HR 122. QTc on ECG ok today.  He thinks that AF may have begun today.  - Continue Tikosyn.   - Continue warfarin.  - Start Toprol XL 25 mg daily (has not tolerated beta blockers in past due to fatigue, but ideally would like to try low-dose to lower HR and encourage conversion to sinus rhythm). - BMET, magnesium, INR today - Repeat EKG & INR next week. If he is still in atrial fibrillation, will need to schedule DCCV. His INRs have been therapeutic for > 1 month.  8. Chronic primarily diastolic CHF: Suspect ischemic cardiomyopathy.  EF 25-30% in 6/15 but had normalized to 55-60% by 1/16.  Down to 45-50% on 1/19 echo but  was in the setting of atrial fibrillation.  TEE in 6/20 showed EF 50%.  NYHA class II-III symptoms, confounded with recent atrial fibrillation and associated SOB. Volume stable today on exam.  - Continue lasix 20 mg daily. BMET today.   - Continue ramipril 5 qam/2.5 qpm.  - Continue spironolactone 25 mg daily.  - Unable to complete echo today due to atrial fibrillation with RVR. He will need repeat echo at next appointment in 1 month. 9. HTN: BP generally controlled.    10. Diabetes: He is on Jardiance.  No GU symptoms. 11. OSA: Daytime sleepiness and fatigue - has app on I-pad for sleep study, awaiting appointment at [facility] to help with download of study.   Follow-up next week for INR check & EKG. If he is still in atrial fibrillation, but rate controlled and not highly symptomatic, will schedule DCCV for week after Christmas. If he is in atrial fibrillation, not rate controlled and highly symptomatic, will schedule DCCV for next week.  Will need stat INR draw day of DCCV.   [name] 11/30/2020

## 2020-11-29 NOTE — Progress Notes (Signed)
Patient ID: Nathaniel Paye., male   DOB: 11/20/1932, 86 y.o.   MRN: [id number] PCP: Dr. Elyse Hsu Cardiology: Dr. Aundra Dubin  86 y.o. with history of extensive vascular disease including CAD s/p CABG and redo CABG, AAA s/p repair, PAD, and carotid stenosis as well as prior AVR presents for followup of CAD and atrial fibrillation.   He has had documentation of significant peripheral arterial disease.  He gets bilateral calf soreness after walking for about 15 minutes.  This resolves with rest, no rest pain.  No pedal ulcers, though when he gets cuts on his feet, they heal slowly.  Peripheral arterial dopplers done in 10/14 showed > 50% focal SFA stenoses bilaterally.  He has carotid artery disease.  No stroke-like symptoms.  He had a endovascular AAA repair in 5/15.    After his AAA repair, he was noted to be in atrial fibrillation.  He had had a prior episode of atrial fibrillation after his cardiac surgery in 2007.  Atrial fibrillation was rate-controlled.  He was started on warfarin and Toprol XL.  He developed exertional dyspnea and fatigue in atrial fibrillation. I cardioverted him back to NSR on 06/06/14.  I also had him get an echocardiogram in 6/15.  This showed EF worsened to 25-30% with stable bioprosthetic aortic valve, mildly dilated RV with normal systolic function.  Given the fall in EF, I took him for [facility] in 7/15.  This showed stable anatomy.  The LIMA-LAD was patent, the sequential SVG-OM/ramus/D was patent only to ramus but this is the same as the prior cath, and the SVG-PDA was patent with a long 50-60% mid-graft stenosis.  No intervention.  Of note, at cath he was noted to be back in atrial fibrillation.  I then started him on amiodarone.  I took Nathaniel [name] for [place] again on 08/21/14.  This was successful.  He has been off amiodarone due to side effects (hallucinations).  Echo in 1/16 showed recovery of EF to 55-60%.    He was admitted in 1/19 with bronchitis/wheezing/dyspnea and was found to be in  atrial fibrillation with RVR.  He tested positive for metapneumovirus.  Echo showed EF 45-50% with normal bioprosthetic aortic valve. He was diuresed for volume overload while in the hospital. He had DCCV to NSR.    Carotid dopplers in 1/19 showed 80-99% RICA stenosis, he had right CEA in 7/51 with no complications.   At appointment in early 6/20, Nathaniel [name] was noted to be back in atrial fibrillation with RVR and mild CHF.  He was started on Toprol XL and Lasix was increased.  He had TEE-guided DCCV to NSR in 6/20.  TEE showed EF 50%, mild diffuse hypokinesis, mildly decreased RV systolic function, normally functioning bioprosthetic aortic valve.   In 2021, he was admitted for Tikosyn initiation.   Here today for follow up and found to be in rapid atrial fibrillation. He feels he just went out of rhythm before appointment today. Has some SOB today, able to walk about 50-75 yards before becoming winded. Prior to today has had no significant SOB. No CP, edema, PND/orthopnnea, or dizziness. Does not weigh at home.  Taking all medications.  Going to the driving range 1-2 times/week.  Looking at prior notes, he appears to have been in NSR at his 12/10 appt with Dr. Elyse Hsu.    ECG today 11/29/20: atrial fibrillation 122 bpm,  QTc 485 msec (personally reviewed).  Labs (5/15): K 4.3, creatinine 0.89 Labs (6/15): K 4.3, creatinine 1.4,  BNP 112 Labs (7/15): K 4.4, creatinine 1.2, HCT 40.2 Labs (8/15): K 4, creatinine 1.2, BNP 113, LFTs and TSH normal Labs (10/15): K 4.4, creatinine 1.4 Labs (5/16): K 4.7, creatinine 1.18, HDL 52, LDL 98 Labs (6/16): LDL 118, LFTs normal Labs (1/17): HCT 40.2, K 3.7, creatinine 1.04 Labs (1/19): K 4.9, creatinine 1.19 Labs (2/19): K 4.2, creatinine 0.89 Labs (3/19): LDL 86, HDL 45, TGs 245 Labs (5/19): LDL 110, HDL 39 Labs (6/19): K 4.1, creatinine 1.07 Labs (11/19): LDL 14, HDL 56 Labs (6/20): K 4.1, creatinine 1.0 Labs (7/20): K 4.3, creatinine 1.07 Labs (9/21):  LDL 129, hgb 14.8, K 4.1, creatinine 1.02  PMH: 1. PAD: Peripheral arterial dopplers in 2012 showed > 75% bilateral SFA stenosis. Peripheral arterial dopplers in 10/14 showed > 50% focal bilateral SFA stenoses.  2. AAA: [place] 4/15 with 4.4 cm AAA but concern for penetrating ulcers. CTA abdomen showed 4.4 cm AAA with penetrating ulcers and possible pseudoaneurysms. Now s/p endovascular repair of AAA in 8/29 with no complications.  3. Carotid stenosis: Carotid dopplers (4/15) with 60-79% bilateral ICA stenosis. Carotid dopplers (11/15) with 60-79% bilateral ICA stenosis.  Carotid dopplers 6/16 with 60-79% bilateral ICA stenosis. - Carotid dopplers (1/18): 60-79% RICA stenosis.  - Carotid dopplers (1/19): 80-99% RICA stenosis => Right CEA in 2/19.  - Carotid dopplers (11/19): 40-59% bilateral stenosis.  - Carotid dopplers (1/21): 1-39% BICA stenosis.  4. CAD: CABG 1989 with LIMA-LAD, SVG-D, seq SVG-ramus and OM, sequential SVG-PDA/PLV. SVG-PDA/PLV found to be occluded on cath prior to AVR, so patient had SVG-PDA with AVR in 2007. LHC (7/15) with LIMA-LAD patent with 40-50% stenosis in LAD after touchdown, sequential SVG-ramus/OM/diagonal with only the ramus branch still intact (known from prior cath), SVG-PDA from original surgery TO, SVG-PDA from redo surgery with long 50-60% mid-graft stenosis.  No target for intervention.  5. Severe aortic stenosis: Bioprosthetic AVR in 2007.  6. Atrial fibrillation: Paroxysmal. Initially noted after cardiac surgery, then again after AAA repair.  DCCV to NSR 06/06/14.  DCCV to NSR again 08/21/14.  - DCCV to NSR in 1/19.  - DCCV to NSR in 6/20.  - Now on Tikosyn.  7. Type II diabetes  8. Gout  9. GERD  10. Hyperlipidemia  11. HTN 12. GERD  13. Chronic systolic CHF: Echo (9/37) with EF worsened to 25-30% with grade II diastolic dysfunction, stable bioprosthetic aortic valve, mildly dilated RV with normal systolic function.  Echo (1/16) with EF 55-60%, basal inferior  hypokinesis, mild LVH, bioprosthetic aortic valve with mean gradient 13 mmHg, mild Nathaniel, severe LAE.  - Echo (1/19): EF 45-50%, mild Nathaniel, normal bioprosthetic aortic valve.  - TEE (6/20): EF 50% with mild diffuse hypokinesis, mildly decreased RV systolic function with mild RV enlargement, bioprosthetic aortic valve looked ok, mild Nathaniel.  14. BPH  15. Lung nodules: Followed by pulmonary.   SH: Widower, 2 children, raised his 3 grand-daughters, lives in [place], [place].  Occasional ETOH, no smoking.   FH: Father died from ruptured AAA.   ROS: All systems reviewed and negative except as per HPI.   Current Outpatient Medications  Medication Sig Dispense Refill  . allopurinol (ZYLOPRIM) 300 MG tablet Take 300 mg by mouth daily.   2  . atorvastatin (LIPITOR) 20 MG tablet Take 1 tablet (20 mg total) by mouth daily. 30 tablet 11  . Coenzyme Q10 (CO Q10) 100 MG CAPS Take 100 mg by mouth daily.    Marland Kitchen dofetilide (TIKOSYN) 250 MCG capsule Take  1 capsule (250 mcg total) by mouth 2 (two) times daily. appt required for refill [phone] 60 capsule 0  . doxazosin (CARDURA) 1 MG tablet Take 1 mg by mouth at bedtime. Also take 2 mg tablet every morning    . doxazosin (CARDURA) 2 MG tablet Take 2 mg by mouth every morning. Also take 1 mg tablet every night    . empagliflozin (JARDIANCE) 10 MG TABS tablet Take 10 mg by mouth at bedtime.     . Evolocumab (REPATHA SURECLICK) 696 MG/ML SOAJ Inject 1 pen into the skin every 14 (fourteen) days. 2 mL 11  . ezetimibe (ZETIA) 10 MG tablet Take 10 mg by mouth at bedtime.     . finasteride (PROSCAR) 5 MG tablet Take 1 tablet (5 mg total) by mouth at bedtime. 90 tablet 3  . furosemide (LASIX) 20 MG tablet Take 20 mg by mouth daily.    . Glucosamine HCl-MSM (GLUCOSAMINE-MSM PO) Take 1 tablet by mouth daily.    Marland Kitchen latanoprost (XALATAN) 0.005 % ophthalmic solution Place 1 drop into both eyes at bedtime.     Marland Kitchen loratadine-pseudoephedrine ([name] 24-HOUR) 10-240 MG 24  hr tablet Take 1 tablet by mouth daily as needed (seasonal allergies).     . Multiple Vitamins-Minerals (OCUVITE EXTRA PO) Take 1 tablet by mouth daily.    . ramipril (ALTACE) 2.5 MG capsule TAKE ONE CAPSULE EACH DAY WITH RAMIPRIL 5MG  TO EQUAL 7.5MG  90 capsule 0  . ramipril (ALTACE) 5 MG capsule TAKE ONE CAPSULE EACH DAY 90 capsule 3  . spironolactone (ALDACTONE) 25 MG tablet TAKE ONE TABLET DAILY 30 tablet 3  . tamsulosin (FLOMAX) 0.4 MG CAPS capsule Take 0.4 mg by mouth See admin instructions. Take one capsule (0.4 mg) in the morning, may take a second 0.4 mg at night as needed for urinary retention    . timolol (TIMOPTIC) 0.5 % ophthalmic solution Place 1 drop into both eyes daily.   5  . warfarin (COUMADIN) 3 MG tablet TAKE 3 TABLETS DAILY EXCEPT 2 TABLETS ONMONDAY AND FRIDAY OR AS DIRECTED BY CLINIC 90 tablet 0  . metoprolol succinate (TOPROL XL) 25 MG 24 hr tablet Take 1 tablet (25 mg total) by mouth daily. 30 tablet 11   No current facility-administered medications for this encounter.    BP 130/80   Pulse 91   Wt 91.4 kg (201 lb 6.4 oz)   SpO2 96%   BMI 28.90 kg/m    Wt Readings from Last 3 Encounters:  11/29/20 91.4 kg (201 lb 6.4 oz)  10/14/20 90.3 kg (199 lb)  09/10/20 88.3 kg (194 lb 9.6 oz)   PHYSICAL EXAM: General: NAD Neck: No JVD, no thyromegaly or thyroid nodule.  Lungs: Clear to auscultation bilaterally with normal respiratory effort. CV: Nondisplaced PMI.  Heart tachy, irregular S1/S2, no S3/S4, 2/6 SEM RUSB.  No peripheral edema.  No carotid bruit.  Normal pedal pulses.  Abdomen: Soft, nontender, no hepatosplenomegaly, no distention.  Skin: Intact without lesions or rashes.  Neurologic: Alert and oriented x 3.  Psych: Normal affect. Extremities: No clubbing or cyanosis.  HEENT: Normal.   Assessment/Plan:  1. CAD: s/p CABG and redo CABG.  Given fall in EF, I did a cardiac cath in 7/15 as documented above. Unfortunately, despite significant coronary disease,  there were no good interventional targets.  No recent chest pain.  - Continue atorvastatin, Zetia, and Repatha.  Check lipids today.  - He is on warfarin in setting of stable CAD so does  not need ASA 81.   2. Hyperlipidemia: He is taking atorvastatin + Zetia and has restarted Repatha.  - Lipids today.  3. PAD: Patient denies claudication or foot ulcers. Peripheral arterial dopplers showed > 50% bilateral focal SFA stenoses in the past.  He is not a cilostazol candidate with CHF. He is followed by Dr. Trula Slade at [facility].  - No current claudication symptoms. 4. Carotid stenosis: S/p right CEA in 2/19.  Followed by Dr. Trula Slade at [facility]. Carotid Dopplers completed 11/21-->bilateral ICA without significant stenosis but >50% common carotid stenosis on left. - Make sure he has followup with [facility].  5. AAA: Successful endovascular repair in 5/15.  Followed by Dr. Trula Slade at [facility].  6. Bioprosthetic AVR: Valve stable on 6/20 TEE.  7. Atrial fibrillation: Possible CNS side effects with amiodarone.  He has not tolerated beta blockers due to profound fatigue.  Given CHF with atrial fibrillation in the past, and difficulty with rate control, I think that we need to maintain NSR.  Appointment with PCP last week showed HR of 53, presumably sinus rhythm. He is now back in atrial fibrillation despite Tikosyn, HR 122. QTc on ECG ok today.  He thinks that AF may have begun today.  - Continue Tikosyn.   - Continue warfarin.  - Start Toprol XL 25 mg daily (has not tolerated beta blockers in past due to fatigue, but ideally would like to try low-dose to lower HR and encourage conversion to sinus rhythm). - BMET, magnesium, INR today - Repeat EKG & INR next week. If he is still in atrial fibrillation, will need to schedule DCCV. His INRs have been therapeutic for > 1 month.  8. Chronic primarily diastolic CHF: Suspect ischemic cardiomyopathy.  EF 25-30% in 6/15 but had normalized to 55-60% by 1/16.  Down to 45-50% on 1/19 echo but  was in the setting of atrial fibrillation.  TEE in 6/20 showed EF 50%.  NYHA class II-III symptoms, confounded with recent atrial fibrillation and associated SOB. Volume stable today on exam.  - Continue lasix 20 mg daily. BMET today.   - Continue ramipril 5 qam/2.5 qpm.  - Continue spironolactone 25 mg daily.  - Unable to complete echo today due to atrial fibrillation with RVR. He will need repeat echo at next appointment in 1 month. 9. HTN: BP generally controlled.    10. Diabetes: He is on Jardiance.  No GU symptoms. 11. OSA: Daytime sleepiness and fatigue - has app on I-pad for sleep study, awaiting appointment at [place] to help with download of study.   Follow-up next week for INR check & EKG. If he is still in atrial fibrillation, but rate controlled and not highly symptomatic, will schedule DCCV for week after Christmas. If he is in atrial fibrillation, not rate controlled and highly symptomatic, will schedule DCCV for next week.  Will need stat INR draw day of DCCV.   [name] 11/30/2020

## 2020-12-03 ENCOUNTER — Ambulatory Visit (HOSPITAL_COMMUNITY)
Admission: RE | Admit: 2020-12-03 | Discharge: 2020-12-03 | Disposition: A | Discharge status: Home / Self Care | Payer: Medicare Other | Source: Ambulatory Visit | Attending: Internal Medicine | Admitting: Internal Medicine

## 2020-12-03 ENCOUNTER — Other Ambulatory Visit: Payer: Self-pay

## 2020-12-03 ENCOUNTER — Encounter (HOSPITAL_COMMUNITY): Payer: Self-pay

## 2020-12-03 VITALS — BP 130/84 | HR 83 | Wt 200.6 lb

## 2020-12-03 DIAGNOSIS — I5022 Chronic systolic (congestive) heart failure: Secondary | ICD-10-CM | POA: Diagnosis not present

## 2020-12-03 LAB — PROTIME-INR
INR: 2.8 — ABNORMAL HIGH (ref 0.8–1.2)
Prothrombin Time: 28.9 seconds — ABNORMAL HIGH (ref 11.4–15.2)

## 2020-12-04 ENCOUNTER — Ambulatory Visit (INDEPENDENT_AMBULATORY_CARE_PROVIDER_SITE_OTHER): Payer: Medicare Other | Admitting: Internal Medicine

## 2020-12-04 DIAGNOSIS — Z5181 Encounter for therapeutic drug level monitoring: Secondary | ICD-10-CM | POA: Diagnosis not present

## 2020-12-04 NOTE — Patient Instructions (Addendum)
Description   Spoke with patient over the phone. Instructed pt to continue taking 3 tablets daily. Recheck INR the day of cardioversion.  Call us with any new medications or concerns: [facility] # [phone], Main # [phone].

## 2020-12-10 ENCOUNTER — Other Ambulatory Visit (HOSPITAL_COMMUNITY): Payer: Medicare Other

## 2020-12-11 ENCOUNTER — Other Ambulatory Visit (HOSPITAL_COMMUNITY)
Admission: RE | Admit: 2020-12-11 | Discharge: 2020-12-11 | Disposition: A | Discharge status: Home / Self Care | Payer: Medicare Other | Source: Ambulatory Visit | Attending: Cardiology | Admitting: Cardiology

## 2020-12-11 DIAGNOSIS — Z20822 Contact with and (suspected) exposure to covid-19: Secondary | ICD-10-CM | POA: Diagnosis not present

## 2020-12-11 DIAGNOSIS — Z01812 Encounter for preprocedural laboratory examination: Secondary | ICD-10-CM | POA: Insufficient documentation

## 2020-12-11 LAB — SARS CORONAVIRUS 2 (TAT 6-24 HRS): SARS Coronavirus 2: NEGATIVE

## 2020-12-12 ENCOUNTER — Ambulatory Visit (HOSPITAL_COMMUNITY)
Admission: RE | Admit: 2020-12-12 | Discharge: 2020-12-12 | Disposition: A | Discharge status: Home / Self Care | Payer: Medicare Other | Attending: Cardiology | Admitting: Cardiology

## 2020-12-12 ENCOUNTER — Telehealth: Payer: Self-pay | Admitting: *Deleted

## 2020-12-12 ENCOUNTER — Ambulatory Visit (HOSPITAL_COMMUNITY): Payer: Medicare Other | Admitting: Anesthesiology

## 2020-12-12 ENCOUNTER — Encounter (HOSPITAL_COMMUNITY)
Admission: RE | Disposition: A | Discharge status: Home / Self Care | Payer: Self-pay | Source: Home / Self Care | Attending: Cardiology

## 2020-12-12 ENCOUNTER — Other Ambulatory Visit: Payer: Self-pay

## 2020-12-12 ENCOUNTER — Ambulatory Visit (INDEPENDENT_AMBULATORY_CARE_PROVIDER_SITE_OTHER): Payer: Medicare Other | Admitting: *Deleted

## 2020-12-12 ENCOUNTER — Encounter (HOSPITAL_COMMUNITY): Payer: Self-pay | Admitting: Cardiology

## 2020-12-12 DIAGNOSIS — Z79899 Other long term (current) drug therapy: Secondary | ICD-10-CM | POA: Insufficient documentation

## 2020-12-12 DIAGNOSIS — I2510 Atherosclerotic heart disease of native coronary artery without angina pectoris: Secondary | ICD-10-CM | POA: Diagnosis not present

## 2020-12-12 DIAGNOSIS — I110 Hypertensive heart disease with heart failure: Secondary | ICD-10-CM | POA: Insufficient documentation

## 2020-12-12 DIAGNOSIS — I4891 Unspecified atrial fibrillation: Secondary | ICD-10-CM

## 2020-12-12 DIAGNOSIS — Z7901 Long term (current) use of anticoagulants: Secondary | ICD-10-CM | POA: Diagnosis not present

## 2020-12-12 DIAGNOSIS — Z5181 Encounter for therapeutic drug level monitoring: Secondary | ICD-10-CM

## 2020-12-12 DIAGNOSIS — I480 Paroxysmal atrial fibrillation: Secondary | ICD-10-CM | POA: Insufficient documentation

## 2020-12-12 DIAGNOSIS — I714 Abdominal aortic aneurysm, without rupture: Secondary | ICD-10-CM | POA: Diagnosis not present

## 2020-12-12 DIAGNOSIS — Z953 Presence of xenogenic heart valve: Secondary | ICD-10-CM | POA: Diagnosis not present

## 2020-12-12 DIAGNOSIS — Z951 Presence of aortocoronary bypass graft: Secondary | ICD-10-CM | POA: Diagnosis not present

## 2020-12-12 DIAGNOSIS — E1151 Type 2 diabetes mellitus with diabetic peripheral angiopathy without gangrene: Secondary | ICD-10-CM | POA: Insufficient documentation

## 2020-12-12 DIAGNOSIS — Z7984 Long term (current) use of oral hypoglycemic drugs: Secondary | ICD-10-CM | POA: Insufficient documentation

## 2020-12-12 DIAGNOSIS — K219 Gastro-esophageal reflux disease without esophagitis: Secondary | ICD-10-CM | POA: Diagnosis not present

## 2020-12-12 DIAGNOSIS — I5032 Chronic diastolic (congestive) heart failure: Secondary | ICD-10-CM | POA: Insufficient documentation

## 2020-12-12 DIAGNOSIS — E785 Hyperlipidemia, unspecified: Secondary | ICD-10-CM | POA: Insufficient documentation

## 2020-12-12 DIAGNOSIS — G4733 Obstructive sleep apnea (adult) (pediatric): Secondary | ICD-10-CM | POA: Diagnosis not present

## 2020-12-12 HISTORY — PX: CARDIOVERSION: SHX1299

## 2020-12-12 LAB — POCT I-STAT, CHEM 8
BUN: 20 mg/dL (ref 8–23)
Calcium, Ion: 1.15 mmol/L (ref 1.15–1.40)
Chloride: 106 mmol/L (ref 98–111)
Creatinine, Ser: 1 mg/dL (ref 0.61–1.24)
Glucose, Bld: 127 mg/dL — ABNORMAL HIGH (ref 70–99)
HCT: 46 % (ref 39.0–52.0)
Hemoglobin: 15.6 g/dL (ref 13.0–17.0)
Potassium: 4.3 mmol/L (ref 3.5–5.1)
Sodium: 141 mmol/L (ref 135–145)
TCO2: 26 mmol/L (ref 22–32)

## 2020-12-12 LAB — PROTIME-INR
INR: 6.3 (ref 0.9–1.2)
Prothrombin Time: 62.6 s — ABNORMAL HIGH (ref 9.1–12.0)

## 2020-12-12 LAB — POCT INR: INR: 6.4 — AB (ref 2.0–3.0)

## 2020-12-12 SURGERY — CARDIOVERSION
Anesthesia: General

## 2020-12-12 MED ORDER — SODIUM CHLORIDE 0.9 % IV SOLN: INTRAVENOUS | Status: DC

## 2020-12-12 MED ORDER — LIDOCAINE 2% (20 MG/ML) 5 ML SYRINGE
INTRAMUSCULAR | Status: DC | PRN
  Administered 2020-12-12: 20 mg via INTRAVENOUS

## 2020-12-12 MED ORDER — PROPOFOL 500 MG/50ML IV EMUL
INTRAVENOUS | Status: DC | PRN
  Administered 2020-12-12: 60 mg via INTRAVENOUS

## 2020-12-12 NOTE — Patient Instructions (Addendum)
Description    Called and spoke to pt instructed him to hold warfarin on 12/30, 12/31 and 1/1 then start taking warfarin 3 tablets daily except for 2 tablets on Mondays and Fridays. Recheck INR in 1 week.  Call us with any new medications or concerns: [facility] # [phone], Main # [phone].

## 2020-12-12 NOTE — Interval H&P Note (Signed)
History and Physical Interval Note:  12/12/2020 2:43 PM  Nathaniel [name].  has presented today for surgery, with the diagnosis of AFIB.  The various methods of treatment have been discussed with the patient and family. After consideration of risks, benefits and other options for treatment, the patient has consented to  Procedure(s): CARDIOVERSION (N/A) as a surgical intervention.  The patient's history has been reviewed, patient examined, no change in status, stable for surgery.  I have reviewed the patient's chart and labs.  Questions were answered to the patient's satisfaction.     Marius Betts [employer]

## 2020-12-12 NOTE — Anesthesia Postprocedure Evaluation (Signed)
Anesthesia Post Note  Patient: Nathaniel [name].  Procedure(s) Performed: CARDIOVERSION (N/A )     Patient location during evaluation: Endoscopy Anesthesia Type: General Level of consciousness: awake and alert Pain management: pain level controlled Vital Signs Assessment: post-procedure vital signs reviewed and stable Respiratory status: spontaneous breathing, nonlabored ventilation and respiratory function stable Cardiovascular status: blood pressure returned to baseline and stable Postop Assessment: no apparent nausea or vomiting Anesthetic complications: no   No complications documented.  Last Vitals:  Vitals:   12/12/20 1511 12/12/20 1520  BP: (!) 112/49 124/70  Pulse: (!) 50 64  Resp: 19 (!) 21  Temp:    SpO2: 96% 99%    Last Pain:  Vitals:   12/12/20 1520  TempSrc:   PainSc: 0-No pain                 [name],E. Chaysen Tillman

## 2020-12-12 NOTE — Transfer of Care (Signed)
Immediate Anesthesia Transfer of Care Note  Patient: Nathaniel [name].  Procedure(s) Performed: CARDIOVERSION (N/A )  Patient Location: Endoscopy Unit  Anesthesia Type:General  Level of Consciousness: awake, alert  and oriented  Airway & Oxygen Therapy: Patient Spontanous Breathing and Patient connected to nasal cannula oxygen  Post-op Assessment: Report given to RN and Post -op Vital signs reviewed and stable  Post vital signs: Reviewed and stable  Last Vitals:  Vitals Value Taken Time  BP 90/42 12/12/20 1455  Temp 36.3 C 12/12/20 1455  Pulse 51 12/12/20 1455  Resp 17 12/12/20 1455  SpO2 98 % 12/12/20 1455    Last Pain:  Vitals:   12/12/20 1455  TempSrc: Temporal  PainSc: 0-No pain         Complications: No complications documented.

## 2020-12-12 NOTE — Telephone Encounter (Signed)
Pt stated that he would be late to get his INR check. Pt stated his granddaughter is driving him today and will be at his house at 10:30 to pick him up. Pt also stated he had some V8 juice this morning and was wondering if he could still have DCCV. Informed pt that he would need to ask Dr. Aundra Dubin and call his office. Pt stated that he tried calling and will try again. Messaged and updated Ranae Palms from Dr. Claris Gladden office who stated she spoke with Dr. Aundra Dubin and he is okay to still do cardioversion even though pt had V8 juice this morning. Attempted to call pt to update him no answer. Will update pt when he comes to have INR checked.

## 2020-12-12 NOTE — Anesthesia Preprocedure Evaluation (Addendum)
Anesthesia Evaluation  Patient identified by MRN, date of birth, ID band Patient awake    Reviewed: Allergy & Precautions, NPO status , Patient's Chart, lab work & pertinent test results, reviewed documented beta blocker date and time   Airway Mallampati: II  TM Distance: >3 FB Neck ROM: Full    Dental no notable dental hx.    Pulmonary former smoker,  Quit smoking 1993, 5 pack year history    Pulmonary exam normal breath sounds clear to auscultation       Cardiovascular hypertension, Pt. on medications and Pt. on home beta blockers + CAD, + Peripheral Vascular Disease and + DOE  Normal cardiovascular exam+ dysrhythmias (coumadin, multiple prior cardioversions) Atrial Fibrillation + Valvular Problems/Murmurs (s/p AVR, no issues with valve on 2020 echo)  Rhythm:Regular Rate:Normal  2015 endovascular aortic stent  S/p AVR  Last echo 2020: 1. The right ventricle has mildly reduced systolic function. The cavity  was mildly enlarged.  2. Left atrial size was moderately dilated.  3. Trivial pericardial effusion is present.  4. Mild calcification of the mitral valve leaflet. No evidence of mitral  valve stenosis. Mild mitral regurgitation.  5. There was a bioprosthetic aortic valve that appeared to function  normally. No evidence for stenosis or significant regurgitation.  6. The aortic root, ascending aorta and descending aorta are normal in  size and structure. Mild plaque in descending thoracic aorta and arch.  7. The left ventricle had a visually estimated ejection fraction of of  50%. Left ventrical global hypokinesis without regional wall motion  abnormalities.  8. No evidence of a thrombus present in the left atrial appendage.    Neuro/Psych negative neurological ROS  negative psych ROS   GI/Hepatic Neg liver ROS, Diverticulosis    Endo/Other  negative endocrine ROSdiabetes  Renal/GU Renal InsufficiencyRenal  diseaseCKD, Cr 1.23  negative genitourinary   Musculoskeletal negative musculoskeletal ROS (+)   Abdominal   Peds  Hematology negative hematology ROS (+)   Anesthesia Other Findings   Reproductive/Obstetrics negative OB ROS                            Anesthesia Physical Anesthesia Plan  ASA: III  Anesthesia Plan: General   Post-op Pain Management:    Induction: Intravenous  PONV Risk Score and Plan: TIVA and Treatment may vary due to age or medical condition  Airway Management Planned: Natural Airway and Mask  Additional Equipment: None  Intra-op Plan:   Post-operative Plan:   Informed Consent: I have reviewed the patients History and Physical, chart, labs and discussed the procedure including the risks, benefits and alternatives for the proposed anesthesia with the patient or authorized representative who has indicated his/her understanding and acceptance.     Dental advisory given  Plan Discussed with: CRNA  Anesthesia Plan Comments: (INR 6.3- cardiologist aware and wishes to proceed Drank V8 juice at 8:30am- will wait 6h)       Anesthesia Quick Evaluation

## 2020-12-12 NOTE — Procedures (Signed)
Electrical Cardioversion Procedure Note Nathaniel [name] [id number] [birth year]  Procedure: Electrical Cardioversion Indications:  Atrial Fibrillation  Procedure Details Consent: Risks of procedure as well as the alternatives and risks of each were explained to the (patient/caregiver).  Consent for procedure obtained. Time Out: Verified patient identification, verified procedure, site/side was marked, verified correct patient position, special equipment/implants available, medications/allergies/relevent history reviewed, required imaging and test results available.  Performed  Patient placed on cardiac monitor, pulse oximetry, supplemental oxygen as necessary.  Sedation given: Propofol per anesthesiology Pacer pads placed anterior and posterior chest.  Cardioverted 1 time(s).  Cardioverted at [place].  Evaluation Findings: Post procedure EKG shows: NSR Complications: None Patient did tolerate procedure well.   [name] 12/12/2020, 2:54 PM

## 2020-12-19 ENCOUNTER — Ambulatory Visit (INDEPENDENT_AMBULATORY_CARE_PROVIDER_SITE_OTHER): Payer: Medicare Other

## 2020-12-19 ENCOUNTER — Other Ambulatory Visit: Payer: Self-pay

## 2020-12-19 DIAGNOSIS — I4891 Unspecified atrial fibrillation: Secondary | ICD-10-CM

## 2020-12-19 DIAGNOSIS — Z5181 Encounter for therapeutic drug level monitoring: Secondary | ICD-10-CM | POA: Diagnosis not present

## 2020-12-19 LAB — POCT INR: INR: 4.2 — AB (ref 2.0–3.0)

## 2020-12-19 NOTE — Patient Instructions (Signed)
Description   Skip today's dosage of Warfarin, then start taking warfarin 3 tablets daily except for 2 tablets on Mondays, Wednesdays and Fridays. Recheck INR in 1 week.  Call us with any new medications or concerns: [facility] # [phone], Main # [phone].

## 2020-12-25 ENCOUNTER — Other Ambulatory Visit (HOSPITAL_COMMUNITY): Payer: Self-pay | Admitting: Nurse Practitioner

## 2020-12-27 ENCOUNTER — Ambulatory Visit (INDEPENDENT_AMBULATORY_CARE_PROVIDER_SITE_OTHER): Payer: Medicare Other

## 2020-12-27 ENCOUNTER — Other Ambulatory Visit: Payer: Self-pay

## 2020-12-27 DIAGNOSIS — Z5181 Encounter for therapeutic drug level monitoring: Secondary | ICD-10-CM | POA: Diagnosis not present

## 2020-12-27 DIAGNOSIS — I4891 Unspecified atrial fibrillation: Secondary | ICD-10-CM | POA: Diagnosis not present

## 2020-12-27 LAB — POCT INR: INR: 1.6 — AB (ref 2.0–3.0)

## 2020-12-27 NOTE — Patient Instructions (Signed)
-  Take 3 tablets warfarin tonight -Try not to have any greens for the next day or 2 - START NEW DOSAGE of warfarin 3 tablets daily except for 2 tablets on Mondays, Wednesdays and Fridays.  -Recheck INR in 2 weeks. Call us with any new medications or concerns: [facility] # [phone], Main # [phone].

## 2020-12-30 ENCOUNTER — Encounter (HOSPITAL_COMMUNITY): Payer: Self-pay

## 2020-12-30 ENCOUNTER — Other Ambulatory Visit (HOSPITAL_COMMUNITY): Payer: Medicare Other

## 2021-01-01 ENCOUNTER — Encounter (HOSPITAL_COMMUNITY): Payer: Self-pay | Admitting: Cardiology

## 2021-01-01 ENCOUNTER — Ambulatory Visit (HOSPITAL_COMMUNITY)
Admission: RE | Admit: 2021-01-01 | Discharge: 2021-01-01 | Disposition: A | Discharge status: Home / Self Care | Payer: Medicare Other | Source: Ambulatory Visit | Attending: Cardiology | Admitting: Cardiology

## 2021-01-01 ENCOUNTER — Ambulatory Visit (HOSPITAL_BASED_OUTPATIENT_CLINIC_OR_DEPARTMENT_OTHER)
Admission: RE | Admit: 2021-01-01 | Discharge: 2021-01-01 | Disposition: A | Discharge status: Home / Self Care | Payer: Medicare Other | Source: Ambulatory Visit | Attending: Cardiology | Admitting: Cardiology

## 2021-01-01 ENCOUNTER — Other Ambulatory Visit: Payer: Self-pay

## 2021-01-01 VITALS — BP 128/70 | HR 90 | Wt 197.2 lb

## 2021-01-01 DIAGNOSIS — R400 Somnolence: Secondary | ICD-10-CM | POA: Insufficient documentation

## 2021-01-01 DIAGNOSIS — Z951 Presence of aortocoronary bypass graft: Secondary | ICD-10-CM | POA: Insufficient documentation

## 2021-01-01 DIAGNOSIS — Z7901 Long term (current) use of anticoagulants: Secondary | ICD-10-CM | POA: Diagnosis not present

## 2021-01-01 DIAGNOSIS — E785 Hyperlipidemia, unspecified: Secondary | ICD-10-CM | POA: Insufficient documentation

## 2021-01-01 DIAGNOSIS — Z8679 Personal history of other diseases of the circulatory system: Secondary | ICD-10-CM | POA: Diagnosis not present

## 2021-01-01 DIAGNOSIS — I4891 Unspecified atrial fibrillation: Secondary | ICD-10-CM | POA: Diagnosis not present

## 2021-01-01 DIAGNOSIS — E1151 Type 2 diabetes mellitus with diabetic peripheral angiopathy without gangrene: Secondary | ICD-10-CM | POA: Diagnosis not present

## 2021-01-01 DIAGNOSIS — I5032 Chronic diastolic (congestive) heart failure: Secondary | ICD-10-CM

## 2021-01-01 DIAGNOSIS — I480 Paroxysmal atrial fibrillation: Secondary | ICD-10-CM | POA: Insufficient documentation

## 2021-01-01 DIAGNOSIS — Z79899 Other long term (current) drug therapy: Secondary | ICD-10-CM | POA: Diagnosis not present

## 2021-01-01 DIAGNOSIS — Z7984 Long term (current) use of oral hypoglycemic drugs: Secondary | ICD-10-CM | POA: Diagnosis not present

## 2021-01-01 DIAGNOSIS — I2510 Atherosclerotic heart disease of native coronary artery without angina pectoris: Secondary | ICD-10-CM | POA: Insufficient documentation

## 2021-01-01 DIAGNOSIS — I6523 Occlusion and stenosis of bilateral carotid arteries: Secondary | ICD-10-CM | POA: Diagnosis not present

## 2021-01-01 DIAGNOSIS — Z953 Presence of xenogenic heart valve: Secondary | ICD-10-CM | POA: Insufficient documentation

## 2021-01-01 DIAGNOSIS — I110 Hypertensive heart disease with heart failure: Secondary | ICD-10-CM | POA: Insufficient documentation

## 2021-01-01 LAB — BASIC METABOLIC PANEL
Anion gap: 9 (ref 5–15)
BUN: 18 mg/dL (ref 8–23)
CO2: 25 mmol/L (ref 22–32)
Calcium: 9.1 mg/dL (ref 8.9–10.3)
Chloride: 105 mmol/L (ref 98–111)
Creatinine, Ser: 1.1 mg/dL (ref 0.61–1.24)
GFR, Estimated: >60 mL/min (ref >60)
Glucose, Bld: 133 mg/dL — ABNORMAL HIGH (ref 70–99)
Potassium: 4.3 mmol/L (ref 3.5–5.1)
Sodium: 139 mmol/L (ref 135–145)

## 2021-01-01 LAB — LIPID PANEL
Cholesterol: 113 mg/dL (ref 0–200)
HDL: 49 mg/dL (ref >40)
LDL Cholesterol: 45 mg/dL (ref 0–99)
Total CHOL/HDL Ratio: 2.3 RATIO
Triglycerides: 96 mg/dL (ref <150)
VLDL: 19 mg/dL (ref 0–40)

## 2021-01-01 LAB — BRAIN NATRIURETIC PEPTIDE: B Natriuretic Peptide: 183.8 pg/mL — ABNORMAL HIGH (ref 0.0–100.0)

## 2021-01-01 LAB — ECHOCARDIOGRAM COMPLETE
AR max vel: 1.7 cm2
AV Area VTI: 1.76 cm2
AV Area mean vel: 1.64 cm2
AV Mean grad: 7.8 mmHg
AV Peak grad: 13.8 mmHg
Ao pk vel: 1.86 m/s
S' Lateral: 4 cm

## 2021-01-01 MED ORDER — METOPROLOL SUCCINATE ER 25 MG PO TB24: 25.0000 mg | ORAL_TABLET | Freq: Two times a day (BID) | ORAL | 11 refills | Status: DC

## 2021-01-01 MED ORDER — FUROSEMIDE 20 MG PO TABS: 40.0000 mg | ORAL_TABLET | Freq: Every day | ORAL | 3 refills | Status: DC

## 2021-01-01 MED ORDER — EZETIMIBE 10 MG PO TABS: 10.0000 mg | ORAL_TABLET | Freq: Every day | ORAL | 3 refills | Status: DC

## 2021-01-01 NOTE — Progress Notes (Signed)
Patient ID: Nathaniel [name]., male   DOB: 07-Jun-1932, 87 y.o.   MRN: [id number] PCP: Dr. Elyse [name] Cardiology: Dr. Aundra [name]  87 y.o. with history of extensive vascular disease including CAD s/p CABG and redo CABG, AAA s/p repair, PAD, and carotid stenosis as well as prior AVR presents for followup of CAD and atrial fibrillation.   He has had documentation of significant peripheral arterial disease.  He gets bilateral calf soreness after walking for about 15 minutes.  This resolves with rest, no rest pain.  No pedal ulcers, though when he gets cuts on his feet, they heal slowly.  Peripheral arterial dopplers done in 10/14 showed > 50% focal SFA stenoses bilaterally.  He has carotid artery disease.  No stroke-like symptoms.  He had a endovascular AAA repair in 5/15.    After his AAA repair, he was noted to be in atrial fibrillation.  He had had a prior episode of atrial fibrillation after his cardiac surgery in 2007.  Atrial fibrillation was rate-controlled.  He was started on warfarin and Toprol XL.  He developed exertional dyspnea and fatigue in atrial fibrillation. I cardioverted him back to NSR on 06/06/14.  I also had him get an echocardiogram in 6/15.  This showed EF worsened to 25-30% with stable bioprosthetic aortic valve, mildly dilated RV with normal systolic function.  Given the fall in EF, I took him for [facility] in 7/15.  This showed stable anatomy.  The LIMA-LAD was patent, the sequential SVG-OM/ramus/D was patent only to ramus but this is the same as the prior cath, and the SVG-PDA was patent with a long 50-60% mid-graft stenosis.  No intervention.  Of note, at cath he was noted to be back in atrial fibrillation.  I then started him on amiodarone.  I took Nathaniel [name] for [place] again on 08/21/14.  This was successful.  He has been off amiodarone due to side effects (hallucinations).  Echo in 1/16 showed recovery of EF to 55-60%.    He was admitted in 1/19 with bronchitis/wheezing/dyspnea and was found to be in  atrial fibrillation with RVR.  He tested positive for metapneumovirus.  Echo showed EF 45-50% with normal bioprosthetic aortic valve. He was diuresed for volume overload while in the hospital. He had DCCV to NSR.    Carotid dopplers in 1/19 showed 80-99% RICA stenosis, he had right CEA in 2/20 with no complications.   At appointment in early 6/20, Nathaniel [name] was noted to be back in atrial fibrillation with RVR and mild CHF.  He was started on Toprol XL and Lasix was increased.  He had TEE-guided DCCV to NSR in 6/20.  TEE showed EF 50%, mild diffuse hypokinesis, mildly decreased RV systolic function, normally functioning bioprosthetic aortic valve.   In 2021, he was admitted for Tikosyn initiation.  In 12/21, he went into atrial fibrillation again and was cardioverted back to NSR.   Echo was done today and reviewed, EF 45-50%, diffuse hypokinesis, mildly decreased RV systolic function, moderate biatrial enlargement, bioprosthetic valve with mean gradient 8 mmHg.    He presents today for followup of CHF and atrial fibrillation.   He is back in atrial fibrillation today.  He felt better after his cardioversion for a couple of days then exertional dyspnea returned.  He did not notice his heart fluttering.  Weight is down 4 lbs.  He is short of breath walking 75 yards if he walks briskly.  No chest pain.  He is fatigued easily.  No  orthopnea/PND. No lightheadedness.    ECG: atrial fibrillation 108 bpm,  QTc 415 msec, IVCD 122 msec (personally reviewed).  Labs (5/15): K 4.3, creatinine 0.89 Labs (6/15): K 4.3, creatinine 1.4, BNP 112 Labs (7/15): K 4.4, creatinine 1.2, HCT 40.2 Labs (8/15): K 4, creatinine 1.2, BNP 113, LFTs and TSH normal Labs (10/15): K 4.4, creatinine 1.4 Labs (5/16): K 4.7, creatinine 1.18, HDL 52, LDL 98 Labs (6/16): LDL 118, LFTs normal Labs (1/17): HCT 40.2, K 3.7, creatinine 1.04 Labs (1/19): K 4.9, creatinine 1.19 Labs (2/19): K 4.2, creatinine 0.89 Labs (3/19): LDL 86, HDL  45, TGs 245 Labs (5/19): LDL 110, HDL 39 Labs (6/19): K 4.1, creatinine 1.07 Labs (11/19): LDL 14, HDL 56 Labs (6/20): K 4.1, creatinine 1.0 Labs (7/20): K 4.3, creatinine 1.07 Labs (9/21): LDL 129, hgb 14.8, K 4.1, creatinine 1.02 Labs (12/21): LDL 20, HDL 46, K 4.4, creatinine 1.23  PMH: 1. PAD: Peripheral arterial dopplers in 2012 showed > 75% bilateral SFA stenosis. Peripheral arterial dopplers in 10/14 showed > 50% focal bilateral SFA stenoses.  2. AAA: [place] 4/15 with 4.4 cm AAA but concern for penetrating ulcers. CTA abdomen showed 4.4 cm AAA with penetrating ulcers and possible pseudoaneurysms. Now s/p endovascular repair of AAA in 2/45 with no complications.  3. Carotid stenosis: Carotid dopplers (4/15) with 60-79% bilateral ICA stenosis. Carotid dopplers (11/15) with 60-79% bilateral ICA stenosis.  Carotid dopplers 6/16 with 60-79% bilateral ICA stenosis. - Carotid dopplers (1/18): 60-79% RICA stenosis.  - Carotid dopplers (1/19): 80-99% RICA stenosis => Right CEA in 2/19.  - Carotid dopplers (11/19): 40-59% bilateral stenosis.  - Carotid dopplers (1/21): 1-39% BICA stenosis.  4. CAD: CABG 1989 with LIMA-LAD, SVG-D, seq SVG-ramus and OM, sequential SVG-PDA/PLV. SVG-PDA/PLV found to be occluded on cath prior to AVR, so patient had SVG-PDA with AVR in 2007. LHC (7/15) with LIMA-LAD patent with 40-50% stenosis in LAD after touchdown, sequential SVG-ramus/OM/diagonal with only the ramus branch still intact (known from prior cath), SVG-PDA from original surgery TO, SVG-PDA from redo surgery with long 50-60% mid-graft stenosis.  No target for intervention.  5. Severe aortic stenosis: Bioprosthetic AVR in 2007.  6. Atrial fibrillation: Paroxysmal. Initially noted after cardiac surgery, then again after AAA repair.  DCCV to NSR 06/06/14.  DCCV to NSR again 08/21/14.  - DCCV to NSR in 1/19.  - DCCV to NSR in 6/20.  - Now on Tikosyn.  - DCCV to NSR 12/21.  7. Type II diabetes  8. Gout  9. GERD   10. Hyperlipidemia  11. HTN 12. GERD  13. Chronic systolic CHF: Echo (8/09) with EF worsened to 25-30% with grade II diastolic dysfunction, stable bioprosthetic aortic valve, mildly dilated RV with normal systolic function.  Echo (1/16) with EF 55-60%, basal inferior hypokinesis, mild LVH, bioprosthetic aortic valve with mean gradient 13 mmHg, mild Nathaniel, severe LAE.  - Echo (1/19): EF 45-50%, mild Nathaniel, normal bioprosthetic aortic valve.  - TEE (6/20): EF 50% with mild diffuse hypokinesis, mildly decreased RV systolic function with mild RV enlargement, bioprosthetic aortic valve looked ok, mild Nathaniel. - Echo (1/22):  EF 45-50%, diffuse hypokinesis, mildly decreased RV systolic function, moderate biatrial enlargement, bioprosthetic valve with mean gradient 8 mmHg.   14. BPH  15. Lung nodules: Followed by pulmonary.   SH: Widower, 2 children, raised his 3 grand-daughters, lives in [place], [place].  Occasional ETOH, no smoking.   FH: Father died from ruptured AAA.   ROS: All systems reviewed and negative except  as per HPI.   Current Outpatient Medications  Medication Sig Dispense Refill  . allopurinol (ZYLOPRIM) 300 MG tablet Take 300 mg by mouth daily.   2  . atorvastatin (LIPITOR) 20 MG tablet Take 1 tablet (20 mg total) by mouth daily. (Patient taking differently: Take 10-20 mg by mouth See admin instructions. Take 20 mg every morning and additional 10 mg every other night) 30 tablet 11  . Coenzyme Q10 (CO Q10) 100 MG CAPS Take 200 mg by mouth daily.    Marland Kitchen dofetilide (TIKOSYN) 250 MCG capsule TAKE ONE CAPSULE TWICE A DAY 60 capsule 0  . doxazosin (CARDURA) 1 MG tablet Take 1 mg by mouth at bedtime.    Marland Kitchen doxazosin (CARDURA) 2 MG tablet Take 2 mg by mouth every morning.    . empagliflozin (JARDIANCE) 10 MG TABS tablet Take 10 mg by mouth at bedtime.     . Evolocumab (REPATHA SURECLICK) 220 MG/ML SOAJ Inject 1 pen into the skin every 14 (fourteen) days. 2 mL 11  . latanoprost (XALATAN) 0.005  % ophthalmic solution Place 1 drop into both eyes daily.    Marland Kitchen loratadine-pseudoephedrine ([name] 24-HOUR) 10-240 MG 24 hr tablet Take 1 tablet by mouth daily as needed (seasonal allergies).     . melatonin 3 MG TABS tablet Take 3 mg by mouth at bedtime.    . Misc Natural Products (GLUCOSAMINE CHONDROITIN TRIPLE) TABS Take 1 tablet by mouth daily.    . ramipril (ALTACE) 2.5 MG capsule TAKE ONE CAPSULE EACH DAY WITH RAMIPRIL 5MG  TO EQUAL 7.5MG  90 capsule 0  . spironolactone (ALDACTONE) 25 MG tablet TAKE ONE TABLET DAILY 30 tablet 3  . tamsulosin (FLOMAX) 0.4 MG CAPS capsule Take 0.4 mg by mouth 2 (two) times daily.    . timolol (TIMOPTIC) 0.5 % ophthalmic solution Place 1 drop into both eyes at bedtime.  5  . warfarin (COUMADIN) 3 MG tablet TAKE 3 TABLETS DAILY EXCEPT 2 TABLETS ONMONDAY AND FRIDAY OR AS DIRECTED BY CLINIC (Patient taking differently: Take 9 mg by mouth at bedtime.) 90 tablet 0  . ezetimibe (ZETIA) 10 MG tablet Take 1 tablet (10 mg total) by mouth at bedtime. 30 tablet 3  . furosemide (LASIX) 20 MG tablet Take 2 tablets (40 mg total) by mouth daily. 60 tablet 3  . metoprolol succinate (TOPROL XL) 25 MG 24 hr tablet Take 1 tablet (25 mg total) by mouth in the morning and at bedtime. 60 tablet 11   No current facility-administered medications for this encounter.    BP 128/70   Pulse 90   Wt 89.4 kg (197 lb 3.2 oz)   SpO2 96%   BMI 28.30 kg/m    Wt Readings from Last 3 Encounters:  01/01/21 89.4 kg (197 lb 3.2 oz)  12/12/20 88.5 kg (195 lb)  12/03/20 91 kg (200 lb 9.6 oz)   PHYSICAL EXAM: General: NAD Neck: JVP 8-9 cm, no thyromegaly or thyroid nodule.  Lungs: Clear to auscultation bilaterally with normal respiratory effort. CV: Nondisplaced PMI.  Heart irregular S1/S2, no S3/S4, no murmur.  1+ right ankle edema.  No carotid bruit.  Unable to palpate pedal pulses.  Abdomen: Soft, nontender, no hepatosplenomegaly, no distention.  Skin: Intact without lesions or rashes.   Neurologic: Alert and oriented x 3.  Psych: Normal affect. Extremities: No clubbing or cyanosis.  HEENT: Normal.   Assessment/Plan:  1. CAD: s/p CABG and redo CABG.  Given fall in EF, I did a cardiac cath in 7/15 as  documented above. Unfortunately, despite significant coronary disease, there were no good interventional targets.  No recent chest pain.  - Continue atorvastatin, Zetia, and Repatha.  Good lipids in 12/21.  - He is on warfarin in setting of stable CAD so does not need ASA 81.   2. Hyperlipidemia: He is taking atorvastatin + Zetia and Repatha.  - Good lipids 12/21.  3. PAD: Patient denies claudication or foot ulcers. Peripheral arterial dopplers showed > 50% bilateral focal SFA stenoses in the past.  He is not a cilostazol candidate with CHF. He is followed by Dr. Trula Slade at [facility]. No current claudication symptoms. - Needs followup at [facility] for peripheral arterial dopplers.  4. Carotid stenosis: S/p right CEA in 2/19.  Followed by Dr. Trula Slade at [facility]. Carotid Dopplers completed 11/21-->bilateral ICA without significant stenosis but >50% common carotid stenosis on left. - Make sure he has followup with [facility].  5. AAA: Successful endovascular repair in 5/15.  Followed by Dr. Trula Slade at [facility].  6. Bioprosthetic AVR: Valve stable on today's echo.   7. Atrial fibrillation: Possible CNS side effects with amiodarone.  Given worsening CHF with atrial fibrillation and difficulty with rate control, I think that we need to maintain NSR.  He is now back in atrial fibrillation despite Tikosyn and successful DCCV in 12/21, HR around 100. QTc on ECG ok today.  He thinks atrial fibrillation began a couple of days after DCCV (started feeling worse).  - Continue Tikosyn.   - Continue warfarin.  - Increase Toprol XL to 25 mg bid. - I am going to have him see EP, would consider him for atrial fibrillation => Tikosyn is not holding NSR for long and he is symptomatic with worse CHF when in atrial fibrillation.   - After he is seen by EP, hopefully in the next couple of weeks, I will see him back and will need to decide on repeat DCCV.  His INR was 1.6 recently, so he may need TEE.   8. Chronic primarily diastolic CHF: Suspect ischemic cardiomyopathy.  EF 25-30% in 6/15 but had normalized to 55-60% by 1/16.  Down to 45-50% on 1/19 echo but was in the setting of atrial fibrillation.  TEE in 6/20 showed EF 50%.  Echo was done today, showing EF 45-50%.  NYHA class III symptoms, worse in atrial fibrillation.  He is mildly volume overloaded on exam.   - Increase Lasix to 40 mg daily.  BMET today and in 10 days.   - Continue ramipril 5 qam/2.5 qpm.  - Continue spironolactone 25 mg daily.  - Continue Jardiance 10 mg daily.  9. HTN: BP generally controlled.    10. Diabetes: He is on Jardiance.  No GU symptoms. 11. OSA: Daytime sleepiness and fatigue - has app on I-pad for sleep study, awaiting appointment at [facility] to help with download of study.   I would like him to see EP regarding AF ablation in the next couple of weeks, then I will see him back in 3 wks to decide on the next steps.   [name] 01/01/2021

## 2021-01-01 NOTE — Progress Notes (Signed)
  Echocardiogram 2D Echocardiogram has been performed.  Nathaniel [name] 01/01/2021, 12:17 PM

## 2021-01-01 NOTE — Patient Instructions (Addendum)
INCREASE Toprol XL 25mg  (1 tablet) twice daily  INCREASE Lasix 40mg  (2 tablets) daily  A referral for Paramedicine has been placed. A Education officer, museum will reach out.  Labs done today, your results will be available in MyChart, we will contact you for abnormal readings.  Your physician recommends that you schedule repeat labs in 2 weeks and 2 months  Your physician recommends that you schedule a follow-up appointment with EP has been scheduled  Your physician recommends that you schedule a follow-up appointment in: 3 weeks with Dr. Aundra Dubin  If you have any questions or concerns before your next appointment please send [place] a message through [facility] or call our office at [phone].    TO LEAVE A MESSAGE FOR THE NURSE SELECT OPTION 2, PLEASE LEAVE A MESSAGE INCLUDING: . YOUR NAME . DATE OF BIRTH . CALL BACK NUMBER . REASON FOR CALL**this is important as we prioritize the call backs  YOU WILL RECEIVE A CALL BACK THE SAME DAY AS LONG AS YOU CALL BEFORE 4:00 PM

## 2021-01-02 DIAGNOSIS — N323 Diverticulum of bladder: Secondary | ICD-10-CM | POA: Diagnosis not present

## 2021-01-02 DIAGNOSIS — N401 Enlarged prostate with lower urinary tract symptoms: Secondary | ICD-10-CM | POA: Diagnosis not present

## 2021-01-02 DIAGNOSIS — N138 Other obstructive and reflux uropathy: Secondary | ICD-10-CM | POA: Diagnosis not present

## 2021-01-10 ENCOUNTER — Ambulatory Visit (INDEPENDENT_AMBULATORY_CARE_PROVIDER_SITE_OTHER): Payer: Medicare Other

## 2021-01-10 ENCOUNTER — Other Ambulatory Visit: Payer: Self-pay

## 2021-01-10 DIAGNOSIS — Z5181 Encounter for therapeutic drug level monitoring: Secondary | ICD-10-CM

## 2021-01-10 DIAGNOSIS — I4891 Unspecified atrial fibrillation: Secondary | ICD-10-CM

## 2021-01-10 LAB — POCT INR: INR: 1.6 — AB (ref 2.0–3.0)

## 2021-01-10 NOTE — Patient Instructions (Signed)
-  Take 3 tablets warfarin tonight - continue of warfarin 3 tablets daily except for 2 tablets on Mondays, Wednesdays and Fridays.  -Recheck INR in 2 weeks. Call us with any new medications or concerns: [facility] # [phone], Main # [phone].

## 2021-01-13 ENCOUNTER — Telehealth (HOSPITAL_COMMUNITY): Payer: Self-pay | Admitting: Licensed Clinical Social Worker

## 2021-01-13 NOTE — Telephone Encounter (Signed)
CSW received consult from MD to have pt added to [place] called pt and explained program to him.  Pt reports he is not interested in program at this time.   Encouraged pt to reach out if he has concerns or changes his mind regarding enrollment  Will continue to follow and assist as needed  Jorge Ny, [place] Worker [facility] Desk#: [phone] Cell#: [phone]

## 2021-01-15 ENCOUNTER — Ambulatory Visit: Payer: Medicare Other | Admitting: Internal Medicine

## 2021-01-23 ENCOUNTER — Ambulatory Visit (HOSPITAL_COMMUNITY)
Admission: RE | Admit: 2021-01-23 | Discharge: 2021-01-23 | Disposition: A | Discharge status: Home / Self Care | Payer: Medicare Other | Source: Ambulatory Visit | Attending: Cardiology | Admitting: Cardiology

## 2021-01-23 ENCOUNTER — Other Ambulatory Visit (HOSPITAL_COMMUNITY): Payer: Self-pay | Admitting: *Deleted

## 2021-01-23 ENCOUNTER — Ambulatory Visit (INDEPENDENT_AMBULATORY_CARE_PROVIDER_SITE_OTHER): Payer: Medicare Other | Admitting: *Deleted

## 2021-01-23 ENCOUNTER — Other Ambulatory Visit: Payer: Self-pay

## 2021-01-23 VITALS — BP 110/68 | HR 116 | Wt 201.1 lb

## 2021-01-23 DIAGNOSIS — I4891 Unspecified atrial fibrillation: Secondary | ICD-10-CM | POA: Diagnosis not present

## 2021-01-23 DIAGNOSIS — I739 Peripheral vascular disease, unspecified: Secondary | ICD-10-CM | POA: Insufficient documentation

## 2021-01-23 DIAGNOSIS — I110 Hypertensive heart disease with heart failure: Secondary | ICD-10-CM | POA: Diagnosis not present

## 2021-01-23 DIAGNOSIS — Z7984 Long term (current) use of oral hypoglycemic drugs: Secondary | ICD-10-CM | POA: Insufficient documentation

## 2021-01-23 DIAGNOSIS — I2510 Atherosclerotic heart disease of native coronary artery without angina pectoris: Secondary | ICD-10-CM | POA: Insufficient documentation

## 2021-01-23 DIAGNOSIS — E785 Hyperlipidemia, unspecified: Secondary | ICD-10-CM | POA: Insufficient documentation

## 2021-01-23 DIAGNOSIS — Z79899 Other long term (current) drug therapy: Secondary | ICD-10-CM | POA: Insufficient documentation

## 2021-01-23 DIAGNOSIS — I480 Paroxysmal atrial fibrillation: Secondary | ICD-10-CM | POA: Diagnosis not present

## 2021-01-23 DIAGNOSIS — Z7901 Long term (current) use of anticoagulants: Secondary | ICD-10-CM | POA: Insufficient documentation

## 2021-01-23 DIAGNOSIS — Z953 Presence of xenogenic heart valve: Secondary | ICD-10-CM | POA: Insufficient documentation

## 2021-01-23 DIAGNOSIS — I447 Left bundle-branch block, unspecified: Secondary | ICD-10-CM | POA: Insufficient documentation

## 2021-01-23 DIAGNOSIS — Z951 Presence of aortocoronary bypass graft: Secondary | ICD-10-CM | POA: Insufficient documentation

## 2021-01-23 DIAGNOSIS — I5042 Chronic combined systolic (congestive) and diastolic (congestive) heart failure: Secondary | ICD-10-CM | POA: Insufficient documentation

## 2021-01-23 DIAGNOSIS — Z5181 Encounter for therapeutic drug level monitoring: Secondary | ICD-10-CM | POA: Diagnosis not present

## 2021-01-23 DIAGNOSIS — E119 Type 2 diabetes mellitus without complications: Secondary | ICD-10-CM | POA: Insufficient documentation

## 2021-01-23 DIAGNOSIS — I5022 Chronic systolic (congestive) heart failure: Secondary | ICD-10-CM | POA: Diagnosis not present

## 2021-01-23 LAB — CBC
HCT: 46.9 % (ref 39.0–52.0)
Hemoglobin: 14.7 g/dL (ref 13.0–17.0)
MCH: 29.5 pg (ref 26.0–34.0)
MCHC: 31.3 g/dL (ref 30.0–36.0)
MCV: 94.2 fL (ref 80.0–100.0)
Platelets: 169 10*3/uL (ref 150–400)
RBC: 4.98 MIL/uL (ref 4.22–5.81)
RDW: 12.9 % (ref 11.5–15.5)
WBC: 6.7 10*3/uL (ref 4.0–10.5)
nRBC: 0 % (ref 0.0–0.2)

## 2021-01-23 LAB — BASIC METABOLIC PANEL
Anion gap: 9 (ref 5–15)
BUN: 17 mg/dL (ref 8–23)
CO2: 25 mmol/L (ref 22–32)
Calcium: 8.4 mg/dL — ABNORMAL LOW (ref 8.9–10.3)
Chloride: 106 mmol/L (ref 98–111)
Creatinine, Ser: 1.12 mg/dL (ref 0.61–1.24)
GFR, Estimated: >60 mL/min (ref >60)
Glucose, Bld: 132 mg/dL — ABNORMAL HIGH (ref 70–99)
Potassium: 4.4 mmol/L (ref 3.5–5.1)
Sodium: 140 mmol/L (ref 135–145)

## 2021-01-23 LAB — POCT INR: INR: 1.5 — AB (ref 2.0–3.0)

## 2021-01-23 NOTE — Patient Instructions (Signed)
Description   Today take 4 tablets then start taking 3 tablets daily except for 2 tablets on Mondays and Fridays. Recheck INR in 2 weeks. Call us with any new medications or concerns: [facility] # [phone], Main # [phone].

## 2021-01-23 NOTE — H&P (View-Only) (Signed)
Patient ID: Nathaniel Hortman., male   DOB: 12-Aug-1932, 87 y.o.   MRN: [id number] PCP: Dr. Elyse Hsu Cardiology: Dr. Aundra Dubin  87 y.o. with history of extensive vascular disease including CAD s/p CABG and redo CABG, AAA s/p repair, PAD, and carotid stenosis as well as prior AVR presents for followup of CAD and atrial fibrillation.   He has had documentation of significant peripheral arterial disease.  He gets bilateral calf soreness after walking for about 15 minutes.  This resolves with rest, no rest pain.  No pedal ulcers, though when he gets cuts on his feet, they heal slowly.  Peripheral arterial dopplers done in 10/14 showed > 50% focal SFA stenoses bilaterally.  He has carotid artery disease.  No stroke-like symptoms.  He had a endovascular AAA repair in 5/15.    After his AAA repair, he was noted to be in atrial fibrillation.  He had had a prior episode of atrial fibrillation after his cardiac surgery in 2007.  Atrial fibrillation was rate-controlled.  He was started on warfarin and Toprol XL.  He developed exertional dyspnea and fatigue in atrial fibrillation. I cardioverted him back to NSR on 06/06/14.  I also had him get an echocardiogram in 6/15.  This showed EF worsened to 25-30% with stable bioprosthetic aortic valve, mildly dilated RV with normal systolic function.  Given the fall in EF, I took him for [facility] in 7/15.  This showed stable anatomy.  The LIMA-LAD was patent, the sequential SVG-OM/ramus/D was patent only to ramus but this is the same as the prior cath, and the SVG-PDA was patent with a long 50-60% mid-graft stenosis.  No intervention.  Of note, at cath he was noted to be back in atrial fibrillation.  I then started him on amiodarone.  I took Nathaniel [name] for [place] again on 08/21/14.  This was successful.  He has been off amiodarone due to side effects (hallucinations).  Echo in 1/16 showed recovery of EF to 55-60%.    He was admitted in 1/19 with bronchitis/wheezing/dyspnea and was found to be in  atrial fibrillation with RVR.  He tested positive for metapneumovirus.  Echo showed EF 45-50% with normal bioprosthetic aortic valve. He was diuresed for volume overload while in the hospital. He had DCCV to NSR.    Carotid dopplers in 1/19 showed 80-99% RICA stenosis, he had right CEA in 4/01 with no complications.   At appointment in early 6/20, Nathaniel [name] was noted to be back in atrial fibrillation with RVR and mild CHF.  He was started on Toprol XL and Lasix was increased.  He had TEE-guided DCCV to NSR in 6/20.  TEE showed EF 50%, mild diffuse hypokinesis, mildly decreased RV systolic function, normally functioning bioprosthetic aortic valve.   In 2021, he was admitted for Tikosyn initiation.  In 12/21, he went into atrial fibrillation again and was cardioverted back to NSR.   Echo in 1/22 showed EF 45-50%, diffuse hypokinesis, mildly decreased RV systolic function, moderate biatrial enlargement, bioprosthetic valve with mean gradient 8 mmHg.    He presents today for followup of CHF and atrial fibrillation.  He went back into atrial fibrillation after last cardioversion and remains in atrial fibrillation today, mild RVR rate 100s.  He missed his appointment with EP.  He is more fatigued in atrial fibrillation.  Short of breath when he "rushes" or walks up stairs.  Does ok walking at a steady pace on flat ground.  No orthopnea/PND.  No chest pain. Weight  is up 4 lbs.  He is only taking 20 mg daily of Lasix and only taking Toprol XL 25 mg daily.   ECG: atrial fibrillation, QTc 486 msec, iLBBB (personally reviewed).  Labs (5/15): K 4.3, creatinine 0.89 Labs (6/15): K 4.3, creatinine 1.4, BNP 112 Labs (7/15): K 4.4, creatinine 1.2, HCT 40.2 Labs (8/15): K 4, creatinine 1.2, BNP 113, LFTs and TSH normal Labs (10/15): K 4.4, creatinine 1.4 Labs (5/16): K 4.7, creatinine 1.18, HDL 52, LDL 98 Labs (6/16): LDL 118, LFTs normal Labs (1/17): HCT 40.2, K 3.7, creatinine 1.04 Labs (1/19): K 4.9,  creatinine 1.19 Labs (2/19): K 4.2, creatinine 0.89 Labs (3/19): LDL 86, HDL 45, TGs 245 Labs (5/19): LDL 110, HDL 39 Labs (6/19): K 4.1, creatinine 1.07 Labs (11/19): LDL 14, HDL 56 Labs (6/20): K 4.1, creatinine 1.0 Labs (7/20): K 4.3, creatinine 1.07 Labs (9/21): LDL 129, hgb 14.8, K 4.1, creatinine 1.02 Labs (12/21): LDL 20, HDL 46, K 4.4, creatinine 1.23 Labs (1/22): LDL 45, HDL 49, K 4.3, creatinine 1.1, BNP 184  PMH: 1. PAD: Peripheral arterial dopplers in 2012 showed > 75% bilateral SFA stenosis. Peripheral arterial dopplers in 10/14 showed > 50% focal bilateral SFA stenoses.  2. AAA: [place] 4/15 with 4.4 cm AAA but concern for penetrating ulcers. CTA abdomen showed 4.4 cm AAA with penetrating ulcers and possible pseudoaneurysms. Now s/p endovascular repair of AAA in 4/09 with no complications.  3. Carotid stenosis: Carotid dopplers (4/15) with 60-79% bilateral ICA stenosis. Carotid dopplers (11/15) with 60-79% bilateral ICA stenosis.  Carotid dopplers 6/16 with 60-79% bilateral ICA stenosis. - Carotid dopplers (1/18): 60-79% RICA stenosis.  - Carotid dopplers (1/19): 80-99% RICA stenosis => Right CEA in 2/19.  - Carotid dopplers (11/19): 40-59% bilateral stenosis.  - Carotid dopplers (1/21): 1-39% BICA stenosis.  4. CAD: CABG 1989 with LIMA-LAD, SVG-D, seq SVG-ramus and OM, sequential SVG-PDA/PLV. SVG-PDA/PLV found to be occluded on cath prior to AVR, so patient had SVG-PDA with AVR in 2007. LHC (7/15) with LIMA-LAD patent with 40-50% stenosis in LAD after touchdown, sequential SVG-ramus/OM/diagonal with only the ramus branch still intact (known from prior cath), SVG-PDA from original surgery TO, SVG-PDA from redo surgery with long 50-60% mid-graft stenosis.  No target for intervention.  5. Severe aortic stenosis: Bioprosthetic AVR in 2007.  6. Atrial fibrillation: Paroxysmal. Initially noted after cardiac surgery, then again after AAA repair.  DCCV to NSR 06/06/14.  DCCV to NSR again  08/21/14.  - DCCV to NSR in 1/19.  - DCCV to NSR in 6/20.  - Now on Tikosyn.  - DCCV to NSR 12/21.  7. Type II diabetes  8. Gout  9. GERD  10. Hyperlipidemia  11. HTN 12. GERD  13. Chronic systolic CHF: Echo (8/11) with EF worsened to 25-30% with grade II diastolic dysfunction, stable bioprosthetic aortic valve, mildly dilated RV with normal systolic function.  Echo (1/16) with EF 55-60%, basal inferior hypokinesis, mild LVH, bioprosthetic aortic valve with mean gradient 13 mmHg, mild Nathaniel, severe LAE.  - Echo (1/19): EF 45-50%, mild Nathaniel, normal bioprosthetic aortic valve.  - TEE (6/20): EF 50% with mild diffuse hypokinesis, mildly decreased RV systolic function with mild RV enlargement, bioprosthetic aortic valve looked ok, mild Nathaniel. - Echo (1/22):  EF 45-50%, diffuse hypokinesis, mildly decreased RV systolic function, moderate biatrial enlargement, bioprosthetic valve with mean gradient 8 mmHg.   14. BPH  15. Lung nodules: Followed by pulmonary.   SH: Widower, 2 children, raised his 3 grand-daughters, lives  in [place], [place].  Occasional ETOH, no smoking.   FH: Father died from ruptured AAA.   ROS: All systems reviewed and negative except as per HPI.   Current Outpatient Medications  Medication Sig Dispense Refill  . allopurinol (ZYLOPRIM) 300 MG tablet Take 300 mg by mouth daily.   2  . atorvastatin (LIPITOR) 10 MG tablet Take 10 mg by mouth every other day. Along with 20 mg tab to equal 30 mg every other day    . atorvastatin (LIPITOR) 20 MG tablet Take 20 mg by mouth daily.    . Coenzyme Q10 (CO Q10) 100 MG CAPS Take 200 mg by mouth daily.    Marland Kitchen dofetilide (TIKOSYN) 250 MCG capsule TAKE ONE CAPSULE TWICE A DAY 60 capsule 0  . doxazosin (CARDURA) 1 MG tablet Take 1 mg by mouth at bedtime.    Marland Kitchen doxazosin (CARDURA) 2 MG tablet Take 2 mg by mouth every morning.    . empagliflozin (JARDIANCE) 10 MG TABS tablet Take 10 mg by mouth at bedtime.     . Evolocumab (REPATHA SURECLICK) 638  MG/ML SOAJ Inject 1 pen into the skin every 14 (fourteen) days. 2 mL 11  . ezetimibe (ZETIA) 10 MG tablet Take 1 tablet (10 mg total) by mouth at bedtime. 30 tablet 3  . furosemide (LASIX) 20 MG tablet Take 2 tablets (40 mg total) by mouth daily. 60 tablet 3  . latanoprost (XALATAN) 0.005 % ophthalmic solution Place 1 drop into both eyes daily.    Marland Kitchen loratadine-pseudoephedrine ([name] 24-HOUR) 10-240 MG 24 hr tablet Take 1 tablet by mouth daily as needed (seasonal allergies).     . melatonin 3 MG TABS tablet Take 3 mg by mouth at bedtime.    . metoprolol succinate (TOPROL XL) 25 MG 24 hr tablet Take 1 tablet (25 mg total) by mouth in the morning and at bedtime. 60 tablet 11  . Misc Natural Products (GLUCOSAMINE CHONDROITIN TRIPLE) TABS Take 1 tablet by mouth daily.    . ramipril (ALTACE) 5 MG capsule Take 5 mg by mouth in the morning.    Marland Kitchen spironolactone (ALDACTONE) 25 MG tablet TAKE ONE TABLET DAILY 30 tablet 3  . tamsulosin (FLOMAX) 0.4 MG CAPS capsule Take 0.4 mg by mouth 2 (two) times daily.    . timolol (TIMOPTIC) 0.5 % ophthalmic solution Place 1 drop into both eyes at bedtime.  5  . warfarin (COUMADIN) 3 MG tablet TAKE 3 TABLETS DAILY EXCEPT 2 TABLETS ONMONDAY AND FRIDAY OR AS DIRECTED BY CLINIC (Patient taking differently: Take 9 mg by mouth at bedtime.) 90 tablet 0   No current facility-administered medications for this encounter.    BP 110/68   Pulse (!) 116   Wt 91.2 kg (201 lb 2 oz)   SpO2 94%   BMI 28.86 kg/m    Wt Readings from Last 3 Encounters:  01/23/21 91.2 kg (201 lb 2 oz)  01/01/21 89.4 kg (197 lb 3.2 oz)  12/12/20 88.5 kg (195 lb)   PHYSICAL EXAM: General: NAD Neck: JVP 8-9 cm, no thyromegaly or thyroid nodule.  Lungs: Clear to auscultation bilaterally with normal respiratory effort. CV: Nondisplaced PMI.  Heart mildly tachy, irregular S1/S2, no S3/S4, no murmur.  1+ right ankle edema.  No carotid bruit.  Unable to palpate pedal pulses.  Abdomen: Soft,  nontender, no hepatosplenomegaly, no distention.  Skin: Intact without lesions or rashes.  Neurologic: Alert and oriented x 3.  Psych: Normal affect. Extremities: No clubbing or cyanosis.  HEENT: Normal.   Assessment/Plan:  1. CAD: s/p CABG and redo CABG.  Given fall in EF, I did a cardiac cath in 7/15 as documented above. Unfortunately, despite significant coronary disease, there were no good interventional targets.  No recent chest pain.  - Continue atorvastatin, Zetia, and Repatha.  Good lipids in 12/21.  - He is on warfarin in setting of stable CAD so does not need ASA 81.   2. Hyperlipidemia: He is taking atorvastatin + Zetia and Repatha.  - Good lipids 12/21.  3. PAD: Patient denies claudication or foot ulcers. Peripheral arterial dopplers showed > 50% bilateral focal SFA stenoses in the past.  He is not a cilostazol candidate with CHF. He is followed by Dr. Trula Slade at [facility]. No current claudication symptoms. - Needs followup at [facility] for peripheral arterial dopplers.  4. Carotid stenosis: S/p right CEA in 2/19.  Followed by Dr. Trula Slade at [facility]. Carotid Dopplers completed 11/21-->bilateral ICA without significant stenosis but >50% common carotid stenosis on left. - Make sure he has followup with [facility].  5. AAA: Successful endovascular repair in 5/15.  Followed by Dr. Trula Slade at [facility].  6. Bioprosthetic AVR: Valve stable on 1/22 echo.   7. Atrial fibrillation: Possible CNS side effects with amiodarone.  Given worsening CHF with atrial fibrillation and difficulty with rate control, I think that we need to maintain NSR.  He is now back in atrial fibrillation despite Tikosyn and successful DCCV in 12/21, HR in 100s. QTc on ECG ok today.  He thinks atrial fibrillation began a couple of days after last DCCV (started feeling worse).  He missed his EP appointment to discuss ablation.  - Continue Tikosyn, I do not think he has other anti-arrhythmic options (did not tolerate amiodarone).   - Continue  warfarin, INR subtherapeutic recently and warfarin increased.  - Increase Toprol XL to 25 mg bid (only taking once daily).  - I am going to reschedule an EP appointment, would consider him for atrial fibrillation => Tikosyn is not holding NSR for long and he is symptomatic with worse CHF when in atrial fibrillation.  - I am going to go ahead and schedule TEE-guided DCCV (does not tolerate AF well, worsening CHF).  Will need TEE as INR has not been consistently therapeutic. We discussed risks/benefits and he agrees to procedure. Will need INR the am of procedure.  8. Chronic primarily diastolic CHF: Suspect ischemic cardiomyopathy.  EF 25-30% in 6/15 but had normalized to 55-60% by 1/16.  Down to 45-50% on 1/19 echo but was in the setting of atrial fibrillation.  TEE in 6/20 showed EF 50%.  Echo in 1/22 showed EF 45-50%.  NYHA class III symptoms, worse in atrial fibrillation.  He is mildly volume overloaded on exam.   - Increase Lasix to 40 mg daily (still only taking 20 mg daily).  BMET today and in 10 days.   - Decrease ramipril to 5 mg daily with increase in Toprol XL.  - Continue spironolactone 25 mg daily.  - Continue Jardiance 10 mg daily.  - Increasing Toprol XL to 25 mg bid.  9. HTN: BP generally controlled.    10. Diabetes: He is on Jardiance.  No GU symptoms. 11. OSA: Daytime sleepiness and fatigue - has app on I-pad for sleep study, awaiting appointment at [place] to help with download of study.   Followup with EP to discuss AF ablation options, TEE-DCCV next week, followup AF clinic 1 month, see me back in 2 months.  [name] 01/23/2021

## 2021-01-23 NOTE — Progress Notes (Signed)
Patient ID: Nathaniel [name]., male   DOB: 1932-11-09, 87 y.o.   MRN: [id number] PCP: Dr. Elyse Hsu Cardiology: Dr. Aundra Dubin  87 y.o. with history of extensive vascular disease including CAD s/p CABG and redo CABG, AAA s/p repair, PAD, and carotid stenosis as well as prior AVR presents for followup of CAD and atrial fibrillation.   He has had documentation of significant peripheral arterial disease.  He gets bilateral calf soreness after walking for about 15 minutes.  This resolves with rest, no rest pain.  No pedal ulcers, though when he gets cuts on his feet, they heal slowly.  Peripheral arterial dopplers done in 10/14 showed > 50% focal SFA stenoses bilaterally.  He has carotid artery disease.  No stroke-like symptoms.  He had a endovascular AAA repair in 5/15.    After his AAA repair, he was noted to be in atrial fibrillation.  He had had a prior episode of atrial fibrillation after his cardiac surgery in 2007.  Atrial fibrillation was rate-controlled.  He was started on warfarin and Toprol XL.  He developed exertional dyspnea and fatigue in atrial fibrillation. I cardioverted him back to NSR on 06/06/14.  I also had him get an echocardiogram in 6/15.  This showed EF worsened to 25-30% with stable bioprosthetic aortic valve, mildly dilated RV with normal systolic function.  Given the fall in EF, I took him for [facility] in 7/15.  This showed stable anatomy.  The LIMA-LAD was patent, the sequential SVG-OM/ramus/D was patent only to ramus but this is the same as the prior cath, and the SVG-PDA was patent with a long 50-60% mid-graft stenosis.  No intervention.  Of note, at cath he was noted to be back in atrial fibrillation.  I then started him on amiodarone.  I took Nathaniel [name] for [place] again on 08/21/14.  This was successful.  He has been off amiodarone due to side effects (hallucinations).  Echo in 1/16 showed recovery of EF to 55-60%.    He was admitted in 1/19 with bronchitis/wheezing/dyspnea and was found to be in  atrial fibrillation with RVR.  He tested positive for metapneumovirus.  Echo showed EF 45-50% with normal bioprosthetic aortic valve. He was diuresed for volume overload while in the hospital. He had DCCV to NSR.    Carotid dopplers in 1/19 showed 80-99% RICA stenosis, he had right CEA in 2/42 with no complications.   At appointment in early 6/20, Nathaniel [name] was noted to be back in atrial fibrillation with RVR and mild CHF.  He was started on Toprol XL and Lasix was increased.  He had TEE-guided DCCV to NSR in 6/20.  TEE showed EF 50%, mild diffuse hypokinesis, mildly decreased RV systolic function, normally functioning bioprosthetic aortic valve.   In 2021, he was admitted for Tikosyn initiation.  In 12/21, he went into atrial fibrillation again and was cardioverted back to NSR.   Echo in 1/22 showed EF 45-50%, diffuse hypokinesis, mildly decreased RV systolic function, moderate biatrial enlargement, bioprosthetic valve with mean gradient 8 mmHg.    He presents today for followup of CHF and atrial fibrillation.  He went back into atrial fibrillation after last cardioversion and remains in atrial fibrillation today, mild RVR rate 100s.  He missed his appointment with EP.  He is more fatigued in atrial fibrillation.  Short of breath when he "rushes" or walks up stairs.  Does ok walking at a steady pace on flat ground.  No orthopnea/PND.  No chest pain. Weight  is up 4 lbs.  He is only taking 20 mg daily of Lasix and only taking Toprol XL 25 mg daily.   ECG: atrial fibrillation, QTc 486 msec, iLBBB (personally reviewed).  Labs (5/15): K 4.3, creatinine 0.89 Labs (6/15): K 4.3, creatinine 1.4, BNP 112 Labs (7/15): K 4.4, creatinine 1.2, HCT 40.2 Labs (8/15): K 4, creatinine 1.2, BNP 113, LFTs and TSH normal Labs (10/15): K 4.4, creatinine 1.4 Labs (5/16): K 4.7, creatinine 1.18, HDL 52, LDL 98 Labs (6/16): LDL 118, LFTs normal Labs (1/17): HCT 40.2, K 3.7, creatinine 1.04 Labs (1/19): K 4.9,  creatinine 1.19 Labs (2/19): K 4.2, creatinine 0.89 Labs (3/19): LDL 86, HDL 45, TGs 245 Labs (5/19): LDL 110, HDL 39 Labs (6/19): K 4.1, creatinine 1.07 Labs (11/19): LDL 14, HDL 56 Labs (6/20): K 4.1, creatinine 1.0 Labs (7/20): K 4.3, creatinine 1.07 Labs (9/21): LDL 129, hgb 14.8, K 4.1, creatinine 1.02 Labs (12/21): LDL 20, HDL 46, K 4.4, creatinine 1.23 Labs (1/22): LDL 45, HDL 49, K 4.3, creatinine 1.1, BNP 184  PMH: 1. PAD: Peripheral arterial dopplers in 2012 showed > 75% bilateral SFA stenosis. Peripheral arterial dopplers in 10/14 showed > 50% focal bilateral SFA stenoses.  2. AAA: [place] 4/15 with 4.4 cm AAA but concern for penetrating ulcers. CTA abdomen showed 4.4 cm AAA with penetrating ulcers and possible pseudoaneurysms. Now s/p endovascular repair of AAA in 7/89 with no complications.  3. Carotid stenosis: Carotid dopplers (4/15) with 60-79% bilateral ICA stenosis. Carotid dopplers (11/15) with 60-79% bilateral ICA stenosis.  Carotid dopplers 6/16 with 60-79% bilateral ICA stenosis. - Carotid dopplers (1/18): 60-79% RICA stenosis.  - Carotid dopplers (1/19): 80-99% RICA stenosis => Right CEA in 2/19.  - Carotid dopplers (11/19): 40-59% bilateral stenosis.  - Carotid dopplers (1/21): 1-39% BICA stenosis.  4. CAD: CABG 1989 with LIMA-LAD, SVG-D, seq SVG-ramus and OM, sequential SVG-PDA/PLV. SVG-PDA/PLV found to be occluded on cath prior to AVR, so patient had SVG-PDA with AVR in 2007. LHC (7/15) with LIMA-LAD patent with 40-50% stenosis in LAD after touchdown, sequential SVG-ramus/OM/diagonal with only the ramus branch still intact (known from prior cath), SVG-PDA from original surgery TO, SVG-PDA from redo surgery with long 50-60% mid-graft stenosis.  No target for intervention.  5. Severe aortic stenosis: Bioprosthetic AVR in 2007.  6. Atrial fibrillation: Paroxysmal. Initially noted after cardiac surgery, then again after AAA repair.  DCCV to NSR 06/06/14.  DCCV to NSR again  08/21/14.  - DCCV to NSR in 1/19.  - DCCV to NSR in 6/20.  - Now on Tikosyn.  - DCCV to NSR 12/21.  7. Type II diabetes  8. Gout  9. GERD  10. Hyperlipidemia  11. HTN 12. GERD  13. Chronic systolic CHF: Echo (3/81) with EF worsened to 25-30% with grade II diastolic dysfunction, stable bioprosthetic aortic valve, mildly dilated RV with normal systolic function.  Echo (1/16) with EF 55-60%, basal inferior hypokinesis, mild LVH, bioprosthetic aortic valve with mean gradient 13 mmHg, mild Nathaniel, severe LAE.  - Echo (1/19): EF 45-50%, mild Nathaniel, normal bioprosthetic aortic valve.  - TEE (6/20): EF 50% with mild diffuse hypokinesis, mildly decreased RV systolic function with mild RV enlargement, bioprosthetic aortic valve looked ok, mild Nathaniel. - Echo (1/22):  EF 45-50%, diffuse hypokinesis, mildly decreased RV systolic function, moderate biatrial enlargement, bioprosthetic valve with mean gradient 8 mmHg.   14. BPH  15. Lung nodules: Followed by pulmonary.   SH: Widower, 2 children, raised his 3 grand-daughters, lives  in [place], [place].  Occasional ETOH, no smoking.   FH: Father died from ruptured AAA.   ROS: All systems reviewed and negative except as per HPI.   Current Outpatient Medications  Medication Sig Dispense Refill  . allopurinol (ZYLOPRIM) 300 MG tablet Take 300 mg by mouth daily.   2  . atorvastatin (LIPITOR) 10 MG tablet Take 10 mg by mouth every other day. Along with 20 mg tab to equal 30 mg every other day    . atorvastatin (LIPITOR) 20 MG tablet Take 20 mg by mouth daily.    . Coenzyme Q10 (CO Q10) 100 MG CAPS Take 200 mg by mouth daily.    Marland Kitchen dofetilide (TIKOSYN) 250 MCG capsule TAKE ONE CAPSULE TWICE A DAY 60 capsule 0  . doxazosin (CARDURA) 1 MG tablet Take 1 mg by mouth at bedtime.    Marland Kitchen doxazosin (CARDURA) 2 MG tablet Take 2 mg by mouth every morning.    . empagliflozin (JARDIANCE) 10 MG TABS tablet Take 10 mg by mouth at bedtime.     . Evolocumab (REPATHA SURECLICK) 782  MG/ML SOAJ Inject 1 pen into the skin every 14 (fourteen) days. 2 mL 11  . ezetimibe (ZETIA) 10 MG tablet Take 1 tablet (10 mg total) by mouth at bedtime. 30 tablet 3  . furosemide (LASIX) 20 MG tablet Take 2 tablets (40 mg total) by mouth daily. 60 tablet 3  . latanoprost (XALATAN) 0.005 % ophthalmic solution Place 1 drop into both eyes daily.    Marland Kitchen loratadine-pseudoephedrine ([name] 24-HOUR) 10-240 MG 24 hr tablet Take 1 tablet by mouth daily as needed (seasonal allergies).     . melatonin 3 MG TABS tablet Take 3 mg by mouth at bedtime.    . metoprolol succinate (TOPROL XL) 25 MG 24 hr tablet Take 1 tablet (25 mg total) by mouth in the morning and at bedtime. 60 tablet 11  . Misc Natural Products (GLUCOSAMINE CHONDROITIN TRIPLE) TABS Take 1 tablet by mouth daily.    . ramipril (ALTACE) 5 MG capsule Take 5 mg by mouth in the morning.    Marland Kitchen spironolactone (ALDACTONE) 25 MG tablet TAKE ONE TABLET DAILY 30 tablet 3  . tamsulosin (FLOMAX) 0.4 MG CAPS capsule Take 0.4 mg by mouth 2 (two) times daily.    . timolol (TIMOPTIC) 0.5 % ophthalmic solution Place 1 drop into both eyes at bedtime.  5  . warfarin (COUMADIN) 3 MG tablet TAKE 3 TABLETS DAILY EXCEPT 2 TABLETS ONMONDAY AND FRIDAY OR AS DIRECTED BY CLINIC (Patient taking differently: Take 9 mg by mouth at bedtime.) 90 tablet 0   No current facility-administered medications for this encounter.    BP 110/68   Pulse (!) 116   Wt 91.2 kg (201 lb 2 oz)   SpO2 94%   BMI 28.86 kg/m    Wt Readings from Last 3 Encounters:  01/23/21 91.2 kg (201 lb 2 oz)  01/01/21 89.4 kg (197 lb 3.2 oz)  12/12/20 88.5 kg (195 lb)   PHYSICAL EXAM: General: NAD Neck: JVP 8-9 cm, no thyromegaly or thyroid nodule.  Lungs: Clear to auscultation bilaterally with normal respiratory effort. CV: Nondisplaced PMI.  Heart mildly tachy, irregular S1/S2, no S3/S4, no murmur.  1+ right ankle edema.  No carotid bruit.  Unable to palpate pedal pulses.  Abdomen: Soft,  nontender, no hepatosplenomegaly, no distention.  Skin: Intact without lesions or rashes.  Neurologic: Alert and oriented x 3.  Psych: Normal affect. Extremities: No clubbing or cyanosis.  HEENT: Normal.   Assessment/Plan:  1. CAD: s/p CABG and redo CABG.  Given fall in EF, I did a cardiac cath in 7/15 as documented above. Unfortunately, despite significant coronary disease, there were no good interventional targets.  No recent chest pain.  - Continue atorvastatin, Zetia, and Repatha.  Good lipids in 12/21.  - He is on warfarin in setting of stable CAD so does not need ASA 81.   2. Hyperlipidemia: He is taking atorvastatin + Zetia and Repatha.  - Good lipids 12/21.  3. PAD: Patient denies claudication or foot ulcers. Peripheral arterial dopplers showed > 50% bilateral focal SFA stenoses in the past.  He is not a cilostazol candidate with CHF. He is followed by Dr. Trula Slade at [facility]. No current claudication symptoms. - Needs followup at [facility] for peripheral arterial dopplers.  4. Carotid stenosis: S/p right CEA in 2/19.  Followed by Dr. Trula Slade at [facility]. Carotid Dopplers completed 11/21-->bilateral ICA without significant stenosis but >50% common carotid stenosis on left. - Make sure he has followup with [facility].  5. AAA: Successful endovascular repair in 5/15.  Followed by Dr. Trula Slade at [facility].  6. Bioprosthetic AVR: Valve stable on 1/22 echo.   7. Atrial fibrillation: Possible CNS side effects with amiodarone.  Given worsening CHF with atrial fibrillation and difficulty with rate control, I think that we need to maintain NSR.  He is now back in atrial fibrillation despite Tikosyn and successful DCCV in 12/21, HR in 100s. QTc on ECG ok today.  He thinks atrial fibrillation began a couple of days after last DCCV (started feeling worse).  He missed his EP appointment to discuss ablation.  - Continue Tikosyn, I do not think he has other anti-arrhythmic options (did not tolerate amiodarone).   - Continue  warfarin, INR subtherapeutic recently and warfarin increased.  - Increase Toprol XL to 25 mg bid (only taking once daily).  - I am going to reschedule an EP appointment, would consider him for atrial fibrillation => Tikosyn is not holding NSR for long and he is symptomatic with worse CHF when in atrial fibrillation.  - I am going to go ahead and schedule TEE-guided DCCV (does not tolerate AF well, worsening CHF).  Will need TEE as INR has not been consistently therapeutic. We discussed risks/benefits and he agrees to procedure. Will need INR the am of procedure.  8. Chronic primarily diastolic CHF: Suspect ischemic cardiomyopathy.  EF 25-30% in 6/15 but had normalized to 55-60% by 1/16.  Down to 45-50% on 1/19 echo but was in the setting of atrial fibrillation.  TEE in 6/20 showed EF 50%.  Echo in 1/22 showed EF 45-50%.  NYHA class III symptoms, worse in atrial fibrillation.  He is mildly volume overloaded on exam.   - Increase Lasix to 40 mg daily (still only taking 20 mg daily).  BMET today and in 10 days.   - Decrease ramipril to 5 mg daily with increase in Toprol XL.  - Continue spironolactone 25 mg daily.  - Continue Jardiance 10 mg daily.  - Increasing Toprol XL to 25 mg bid.  9. HTN: BP generally controlled.    10. Diabetes: He is on Jardiance.  No GU symptoms. 11. OSA: Daytime sleepiness and fatigue - has app on I-pad for sleep study, awaiting appointment at [facility] to help with download of study.   Followup with EP to discuss AF ablation options, TEE-DCCV next week, followup AF clinic 1 month, see me back in 2 months.  [name] 01/23/2021

## 2021-01-23 NOTE — Patient Instructions (Signed)
Decrease Ramipril to 5 mg Daily  Increase Metoprolol to 25 mg Twice daily   Increase Furosemide to 40 mg (2 tabs) Daily  Your physician has requested that you have a TEE/Cardioversion. During a TEE, sound waves are used to create images of your heart. It provides your doctor with information about the size and shape of your heart and how well your heart's chambers and valves are working. In this test, a transducer is attached to the end of a flexible tube that is guided down you throat and into your esophagus (the tube leading from your mouth to your stomach) to get a more detailed image of your heart. Once the TEE has determined that a blood clot is not present, the cardioversion begins. Electrical Cardioversion uses a jolt of electricity to your heart either through paddles or wired patches attached to your chest. This is a controlled, usually prescheduled, procedure. This procedure is done at the hospital and you are not awake during the procedure. You usually go home the day of the procedure. Please see the instruction sheet given to you today for more information.  You have been referred to Dr Curt Bears on Feb 22 at 11:00 am  You have been referred to A-fib clinic on March 10 at 9:30 am  Your physician recommends that you schedule a follow-up appointment in: 2 months  If you have any questions or concerns before your next appointment please send [place] a message through [employer] or call our office at [phone].    TO LEAVE A MESSAGE FOR THE NURSE SELECT OPTION 2, PLEASE LEAVE A MESSAGE INCLUDING: . YOUR NAME . DATE OF BIRTH . CALL BACK NUMBER . REASON FOR CALL**this is important as we prioritize the call backs  [place] AS LONG AS YOU CALL BEFORE 4:00 PM  At the [facility], you and your health needs are our priority. As part of our continuing mission to provide you with exceptional heart care, we have created designated Provider Care Teams.  These Care Teams include your primary Cardiologist (physician) and Advanced Practice Providers (APPs- Physician Assistants and Nurse Practitioners) who all work together to provide you with the care you need, when you need it.   You may see any of the following providers on your designated Care Team at your next follow up: Marland Kitchen Dr Glori Bickers . Dr [name] . Darrick Grinder, NP . Lyda Jester, [place] . Audry Riles, PharmD   Please be sure to bring in all your medications bottles to every appointment.    Cardioversion instructions  You are scheduled for a TEE/Cardioversion on Wed 01/29/21 with Dr. Aundra Dubin.  Please arrive at the [facility] (Main Entrance A) at [facility]: [address] [place], [place] [address] at 10:00 am.  DIET: Nothing to eat or drink after midnight except a sip of water with medications (see medication instructions below)  Medication Instructions: Hold Furosemide, Spironolactone, and Jardiance Wed 2/16 AM  Continue your anticoagulant: Coumadin as directed **GO to Coumadin Clinic on 2/16 at 9:00 am   Covid Test: on Monday 2/14 any time between 8 am and 3 pm at:   [place], [place] must have a responsible person to drive you home and stay in the waiting area during your procedure. Failure to do so could result in cancellation.  Bring your insurance cards.  *Special Note: Every effort is made to have your procedure  done on time. Occasionally there are emergencies that occur at the hospital that may cause delays. Please be patient if a delay does occur.

## 2021-01-23 NOTE — Progress Notes (Signed)
Coordinated appointments for patient to see Dr Curt Bears and the [facility], as well as set up tee/dccv along with covid test and coumadin check.  All appointments and instructions placed on AVS and reviewed with pt in detail, he verbalizes understanding, if unable to arrange transportation for covid test or tee/dccv he will call us back to let us know

## 2021-01-27 ENCOUNTER — Ambulatory Visit (HOSPITAL_COMMUNITY)
Admission: RE | Admit: 2021-01-27 | Discharge: 2021-01-27 | Disposition: A | Discharge status: Home / Self Care | Payer: Medicare Other | Source: Ambulatory Visit | Attending: Cardiology | Admitting: Cardiology

## 2021-01-27 ENCOUNTER — Other Ambulatory Visit (HOSPITAL_COMMUNITY)
Admission: RE | Admit: 2021-01-27 | Discharge: 2021-01-27 | Disposition: A | Discharge status: Home / Self Care | Payer: Medicare Other | Source: Ambulatory Visit | Attending: Cardiology | Admitting: Cardiology

## 2021-01-27 ENCOUNTER — Other Ambulatory Visit: Payer: Self-pay

## 2021-01-27 DIAGNOSIS — Z20822 Contact with and (suspected) exposure to covid-19: Secondary | ICD-10-CM | POA: Insufficient documentation

## 2021-01-27 DIAGNOSIS — Z01812 Encounter for preprocedural laboratory examination: Secondary | ICD-10-CM | POA: Insufficient documentation

## 2021-01-27 DIAGNOSIS — I714 Abdominal aortic aneurysm, without rupture, unspecified: Secondary | ICD-10-CM

## 2021-01-28 LAB — SARS CORONAVIRUS 2 (TAT 6-24 HRS): SARS Coronavirus 2: NEGATIVE

## 2021-01-29 ENCOUNTER — Ambulatory Visit (HOSPITAL_COMMUNITY)
Admission: RE | Admit: 2021-01-29 | Discharge: 2021-01-29 | Disposition: A | Discharge status: Home / Self Care | Payer: Medicare Other | Attending: Cardiology | Admitting: Cardiology

## 2021-01-29 ENCOUNTER — Ambulatory Visit (HOSPITAL_COMMUNITY): Payer: Medicare Other | Admitting: Registered Nurse

## 2021-01-29 ENCOUNTER — Other Ambulatory Visit (HOSPITAL_COMMUNITY): Payer: Self-pay | Admitting: Cardiology

## 2021-01-29 ENCOUNTER — Ambulatory Visit (HOSPITAL_BASED_OUTPATIENT_CLINIC_OR_DEPARTMENT_OTHER)
Admission: RE | Admit: 2021-01-29 | Discharge: 2021-01-29 | Disposition: A | Discharge status: Home / Self Care | Payer: Medicare Other | Source: Ambulatory Visit | Attending: Cardiology | Admitting: Cardiology

## 2021-01-29 ENCOUNTER — Ambulatory Visit (INDEPENDENT_AMBULATORY_CARE_PROVIDER_SITE_OTHER): Payer: Medicare Other | Admitting: *Deleted

## 2021-01-29 ENCOUNTER — Encounter (HOSPITAL_COMMUNITY): Payer: Self-pay | Admitting: Cardiology

## 2021-01-29 ENCOUNTER — Encounter (HOSPITAL_COMMUNITY)
Admission: RE | Disposition: A | Discharge status: Home / Self Care | Payer: Self-pay | Source: Home / Self Care | Attending: Cardiology

## 2021-01-29 ENCOUNTER — Other Ambulatory Visit: Payer: Self-pay

## 2021-01-29 DIAGNOSIS — I340 Nonrheumatic mitral (valve) insufficiency: Secondary | ICD-10-CM

## 2021-01-29 DIAGNOSIS — I5042 Chronic combined systolic (congestive) and diastolic (congestive) heart failure: Secondary | ICD-10-CM | POA: Diagnosis not present

## 2021-01-29 DIAGNOSIS — G4733 Obstructive sleep apnea (adult) (pediatric): Secondary | ICD-10-CM | POA: Diagnosis not present

## 2021-01-29 DIAGNOSIS — Z7901 Long term (current) use of anticoagulants: Secondary | ICD-10-CM | POA: Insufficient documentation

## 2021-01-29 DIAGNOSIS — Z951 Presence of aortocoronary bypass graft: Secondary | ICD-10-CM | POA: Diagnosis not present

## 2021-01-29 DIAGNOSIS — E785 Hyperlipidemia, unspecified: Secondary | ICD-10-CM | POA: Insufficient documentation

## 2021-01-29 DIAGNOSIS — I4891 Unspecified atrial fibrillation: Secondary | ICD-10-CM | POA: Diagnosis not present

## 2021-01-29 DIAGNOSIS — Z953 Presence of xenogenic heart valve: Secondary | ICD-10-CM | POA: Insufficient documentation

## 2021-01-29 DIAGNOSIS — Z79899 Other long term (current) drug therapy: Secondary | ICD-10-CM | POA: Diagnosis not present

## 2021-01-29 DIAGNOSIS — I2510 Atherosclerotic heart disease of native coronary artery without angina pectoris: Secondary | ICD-10-CM | POA: Diagnosis not present

## 2021-01-29 DIAGNOSIS — E1151 Type 2 diabetes mellitus with diabetic peripheral angiopathy without gangrene: Secondary | ICD-10-CM | POA: Diagnosis not present

## 2021-01-29 DIAGNOSIS — Z7984 Long term (current) use of oral hypoglycemic drugs: Secondary | ICD-10-CM | POA: Diagnosis not present

## 2021-01-29 DIAGNOSIS — I480 Paroxysmal atrial fibrillation: Secondary | ICD-10-CM | POA: Insufficient documentation

## 2021-01-29 DIAGNOSIS — K219 Gastro-esophageal reflux disease without esophagitis: Secondary | ICD-10-CM | POA: Diagnosis not present

## 2021-01-29 DIAGNOSIS — Z5181 Encounter for therapeutic drug level monitoring: Secondary | ICD-10-CM

## 2021-01-29 DIAGNOSIS — I110 Hypertensive heart disease with heart failure: Secondary | ICD-10-CM | POA: Insufficient documentation

## 2021-01-29 DIAGNOSIS — I714 Abdominal aortic aneurysm, without rupture: Secondary | ICD-10-CM | POA: Diagnosis not present

## 2021-01-29 HISTORY — PX: CARDIOVERSION: SHX1299

## 2021-01-29 HISTORY — PX: TEE WITHOUT CARDIOVERSION: SHX5443

## 2021-01-29 LAB — POCT INR: INR: 2 (ref 2.0–3.0)

## 2021-01-29 SURGERY — ECHOCARDIOGRAM, TRANSESOPHAGEAL
Anesthesia: General

## 2021-01-29 MED ORDER — WARFARIN - PHYSICIAN DOSING INPATIENT: Freq: Every day | Status: DC

## 2021-01-29 MED ORDER — WARFARIN SODIUM 3 MG PO TABS: ORAL_TABLET | ORAL | 0 refills | Status: DC

## 2021-01-29 MED ORDER — EPHEDRINE SULFATE-NACL 50-0.9 MG/10ML-% IV SOSY
PREFILLED_SYRINGE | INTRAVENOUS | Status: DC | PRN
  Administered 2021-01-29 (×2): 10 mg via INTRAVENOUS

## 2021-01-29 MED ORDER — METOPROLOL SUCCINATE ER 25 MG PO TB24: 25.0000 mg | ORAL_TABLET | Freq: Every day | ORAL | 11 refills | Status: DC

## 2021-01-29 MED ORDER — LIDOCAINE 2% (20 MG/ML) 5 ML SYRINGE
INTRAMUSCULAR | Status: DC | PRN
  Administered 2021-01-29: 60 mg via INTRAVENOUS

## 2021-01-29 MED ORDER — PROPOFOL 10 MG/ML IV BOLUS
INTRAVENOUS | Status: DC | PRN
  Administered 2021-01-29: 20 mg via INTRAVENOUS

## 2021-01-29 MED ORDER — PROPOFOL 500 MG/50ML IV EMUL
INTRAVENOUS | Status: DC | PRN
  Administered 2021-01-29: 75 ug/kg/min via INTRAVENOUS

## 2021-01-29 MED ORDER — WARFARIN SODIUM 2 MG PO TABS
2.0000 mg | ORAL_TABLET | ORAL | Status: AC
  Administered 2021-01-29: 2 mg via ORAL
  Filled 2021-01-29: qty 1

## 2021-01-29 MED ORDER — PHENYLEPHRINE 40 MCG/ML (10ML) SYRINGE FOR IV PUSH (FOR BLOOD PRESSURE SUPPORT)
PREFILLED_SYRINGE | INTRAVENOUS | Status: DC | PRN
  Administered 2021-01-29 (×2): 80 ug via INTRAVENOUS
  Administered 2021-01-29: 120 ug via INTRAVENOUS

## 2021-01-29 MED ORDER — SODIUM CHLORIDE 0.9 % IV SOLN: INTRAVENOUS | Status: DC

## 2021-01-29 NOTE — CV Procedure (Signed)
Procedure: TEE   Sedation: Per anesthesiology.  Indication: Atrial fibrillation.   Findings: Please see echo section for full report.  Normal LV size and wall thickness, EF 45-50% with diffuse mild hypokinesis.  Normal right ventricular size and systolic function.  Moderate left atrial dilation with no LA appendage thrombus.  Moderate right atrial enlargement.  Mild TR, peak RV-RA gradient 18 mmHg.  Mild mitral regurgitation, moderate mitral annular calcification, no mitral stenosis.  Bioprosthetic aortic valve with no regurgitation, mean gradient 6 mmHg. The valve opens well.  No PFO or ASD by color doppler.  Normal caliber thoracic aorta with grade 3 plaque in the descending thoracic aorta.   May proceed with DCCV.   Nathaniel [name] 01/29/2021 11:38 AM

## 2021-01-29 NOTE — Anesthesia Postprocedure Evaluation (Signed)
Anesthesia Post Note  Patient: Nathaniel [name].  Procedure(s) Performed: TRANSESOPHAGEAL ECHOCARDIOGRAM (TEE) (N/A ) CARDIOVERSION (N/A )     Patient location during evaluation: Endoscopy Anesthesia Type: General Level of consciousness: awake and alert Pain management: pain level controlled Vital Signs Assessment: post-procedure vital signs reviewed and stable Respiratory status: spontaneous breathing, nonlabored ventilation and respiratory function stable Cardiovascular status: blood pressure returned to baseline and stable Postop Assessment: no apparent nausea or vomiting Anesthetic complications: no   No complications documented.  Last Vitals:  Vitals:   01/29/21 1150 01/29/21 1200  BP: (!) 98/45 117/64  Pulse: 64 63  Resp: 16 15  Temp:    SpO2: 96% 99%    Last Pain:  Vitals:   01/29/21 1200  TempSrc:   PainSc: 0-No pain                 Marguis Mathieson,W. EDMOND

## 2021-01-29 NOTE — Procedures (Signed)
Electrical Cardioversion Procedure Note Ricard Faulkner [id number] November 23, 1932  Procedure: Electrical Cardioversion Indications:  Atrial Fibrillation  Procedure Details Consent: Risks of procedure as well as the alternatives and risks of each were explained to the (patient/caregiver).  Consent for procedure obtained. Time Out: Verified patient identification, verified procedure, site/side was marked, verified correct patient position, special equipment/implants available, medications/allergies/relevent history reviewed, required imaging and test results available.  Performed  Patient placed on cardiac monitor, pulse oximetry, supplemental oxygen as necessary.  Sedation given: Per anesthesiology Pacer pads placed anterior and posterior chest.  Cardioverted 1 time(s).  Cardioverted at [place].  Evaluation Findings: Post procedure EKG shows: NSR Complications: None Patient did tolerate procedure well.   [name] 01/29/2021, 11:39 AM

## 2021-01-29 NOTE — Anesthesia Preprocedure Evaluation (Signed)
Anesthesia Evaluation  Patient identified by MRN, date of birth, ID band Patient awake    Reviewed: Allergy & Precautions, H&P , NPO status , Patient's Chart, lab work & pertinent test results, reviewed documented beta blocker date and time   Airway Mallampati: II  TM Distance: >3 FB Neck ROM: Full    Dental no notable dental hx. (+) Teeth Intact, Dental Advisory Given   Pulmonary neg pulmonary ROS, former smoker,    Pulmonary exam normal breath sounds clear to auscultation       Cardiovascular hypertension, Pt. on medications and Pt. on home beta blockers + CAD, + Peripheral Vascular Disease and +CHF  + dysrhythmias Atrial Fibrillation  Rhythm:Irregular Rate:Normal     Neuro/Psych negative neurological ROS  negative psych ROS   GI/Hepatic Neg liver ROS, GERD  ,  Endo/Other  diabetes  Renal/GU negative Renal ROS  negative genitourinary   Musculoskeletal   Abdominal   Peds  Hematology negative hematology ROS (+)   Anesthesia Other Findings   Reproductive/Obstetrics negative OB ROS                             Anesthesia Physical Anesthesia Plan  ASA: III  Anesthesia Plan: General   Post-op Pain Management:    Induction: Intravenous  PONV Risk Score and Plan: 2 and Propofol infusion and Treatment may vary due to age or medical condition  Airway Management Planned: Nasal Cannula  Additional Equipment:   Intra-op Plan:   Post-operative Plan:   Informed Consent: I have reviewed the patients History and Physical, chart, labs and discussed the procedure including the risks, benefits and alternatives for the proposed anesthesia with the patient or authorized representative who has indicated his/her understanding and acceptance.     Dental advisory given  Plan Discussed with: CRNA  Anesthesia Plan Comments:         Anesthesia Quick Evaluation

## 2021-01-29 NOTE — Interval H&P Note (Signed)
History and Physical Interval Note:  01/29/2021 11:19 AM  Nathaniel [name] [name].  has presented today for surgery, with the diagnosis of AFIB.  The various methods of treatment have been discussed with the patient and family. After consideration of risks, benefits and other options for treatment, the patient has consented to  Procedure(s): TRANSESOPHAGEAL ECHOCARDIOGRAM (TEE) (N/A) CARDIOVERSION (N/A) as a surgical intervention.  The patient's history has been reviewed, patient examined, no change in status, stable for surgery.  I have reviewed the patient's chart and labs.  Questions were answered to the patient's satisfaction.     Lajoy Vanamburg [employer]

## 2021-01-29 NOTE — Patient Instructions (Signed)
Description    Take 4 tablets of warfarin today and then continue to take 3 tablets daily except for 2 tablets on Mondays and Fridays. Recheck INR in 1 week post cardioversion. Coumadin clinic [phone].

## 2021-01-29 NOTE — Anesthesia Procedure Notes (Signed)
Procedure Name: MAC Date/Time: 01/29/2021 11:22 AM Performed by: Trinna Post., CRNA Pre-anesthesia Checklist: Patient identified, Emergency Drugs available, Suction available, Patient being monitored and Timeout performed Patient Re-evaluated:Patient Re-evaluated prior to induction Oxygen Delivery Method: Nasal cannula Preoxygenation: Pre-oxygenation with 100% oxygen Induction Type: IV induction Placement Confirmation: positive ETCO2

## 2021-01-29 NOTE — Discharge Instructions (Signed)
Electrical Cardioversion Electrical cardioversion is the delivery of a jolt of electricity to restore a normal rhythm to the heart. A rhythm that is too fast or is not regular keeps the heart from pumping well. In this procedure, sticky patches or metal paddles are placed on the chest to deliver electricity to the heart from a device. This procedure may be done in an emergency if:  There is low or no blood pressure as a result of the heart rhythm.  Normal rhythm must be restored as fast as possible to protect the brain and heart from further damage.  It may save a life. This may also be a scheduled procedure for irregular or fast heart rhythms that are not immediately life-threatening. Tell a health care provider about:  Any allergies you have.  All medicines you are taking, including vitamins, herbs, eye drops, creams, and over-the-counter medicines.  Any problems you or family members have had with anesthetic medicines.  Any blood disorders you have.  Any surgeries you have had.  Any medical conditions you have.  Whether you are pregnant or may be pregnant. What are the risks? Generally, this is a safe procedure. However, problems may occur, including:  Allergic reactions to medicines.  A blood clot that breaks free and travels to other parts of your body.  The possible return of an abnormal heart rhythm within hours or days after the procedure.  Your heart stopping (cardiac arrest). This is rare. What happens before the procedure? Medicines  Your health care provider may have you start taking: ? Blood-thinning medicines (anticoagulants) so your blood does not clot as easily. ? Medicines to help stabilize your heart rate and rhythm.  Ask your health care provider about: ? Changing or stopping your regular medicines. This is especially important if you are taking diabetes medicines or blood thinners. ? Taking medicines such as aspirin and ibuprofen. These medicines can  thin your blood. Do not take these medicines unless your health care provider tells you to take them. ? Taking over-the-counter medicines, vitamins, herbs, and supplements. General instructions  Follow instructions from your health care provider about eating or drinking restrictions.  Plan to have someone take you home from the hospital or clinic.  If you will be going home right after the procedure, plan to have someone with you for 24 hours.  Ask your health care provider what steps will be taken to help prevent infection. These may include washing your skin with a germ-killing soap. What happens during the procedure?  An IV will be inserted into one of your veins.  Sticky patches (electrodes) or metal paddles may be placed on your chest.  You will be given a medicine to help you relax (sedative).  An electrical shock will be delivered. The procedure may vary among health care providers and hospitals.   What can I expect after the procedure?  Your blood pressure, heart rate, breathing rate, and blood oxygen level will be monitored until you leave the hospital or clinic.  Your heart rhythm will be watched to make sure it does not change.  You may have some redness on the skin where the shocks were given. Follow these instructions at home:  Do not drive for 24 hours if you were given a sedative during your procedure.  Take over-the-counter and prescription medicines only as told by your health care provider.  Ask your health care provider how to check your pulse. Check it often.  Rest for 48 hours after the procedure  or as told by your health care provider.  Avoid or limit your caffeine use as told by your health care provider.  Keep all follow-up visits as told by your health care provider. This is important. Contact a health care provider if:  You feel like your heart is beating too quickly or your pulse is not regular.  You have a serious muscle cramp that does not go  away. Get help right away if:  You have discomfort in your chest.  You are dizzy or you feel faint.  You have trouble breathing or you are short of breath.  Your speech is slurred.  You have trouble moving an arm or leg on one side of your body.  Your fingers or toes turn cold or blue. Summary  Electrical cardioversion is the delivery of a jolt of electricity to restore a normal rhythm to the heart.  This procedure may be done right away in an emergency or may be a scheduled procedure if the condition is not an emergency.  Generally, this is a safe procedure.  After the procedure, check your pulse often as told by your health care provider. This information is not intended to replace advice given to you by your health care provider. Make sure you discuss any questions you have with your health care provider. Document Revised: 07/03/2019 Document Reviewed: 07/03/2019 Elsevier Patient Education  Nathaniel [name]. Transesophageal Echocardiogram Transesophageal echocardiogram (TEE) is a test that uses sound waves to take pictures of your heart. TEE is done by passing a small probe attached to a flexible tube down the part of the body that moves food from your mouth to your stomach (esophagus). The pictures give clear images of your heart. This can help your doctor see if there are problems with your heart. Tell a doctor about:  Any allergies you have.  All medicines you are taking. This includes vitamins, herbs, eye drops, creams, and over-the-counter medicines.  Any problems you or family members have had with anesthetic medicines.  Any blood disorders you have.  Any surgeries you have had.  Any medical conditions you have.  Any swallowing problems.  Whether you have or have had a blockage in the part of the body that moves food from your mouth to your stomach.  Whether you are pregnant or may be pregnant. What are the risks? In general, this is a safe procedure. But,  problems may occur, such as:  Damage to nearby structures or organs.  A tear in the part of the body that moves food from your mouth to your stomach.  Irregular heartbeat.  Hoarse voice or trouble swallowing.  Bleeding. What happens before the procedure? Medicines  Ask your doctor about changing or stopping: ? Your normal medicines. ? Vitamins, herbs, and supplements. ? Over-the-counter medicines.  Do not take aspirin or ibuprofen unless you are told to. General instructions  Follow instructions from your doctor about what you cannot eat or drink.  You will take out any dentures or dental retainers.  Plan to have a responsible adult take you home from the hospital or clinic.  Plan to have a responsible adult care for you for the time you are told after you leave the hospital or clinic. This is important. What happens during the procedure?  An IV will be put into one of your veins.  You may be given: ? A sedative. This medicine helps you relax. ? A medicine to numb the back of your throat. This may be  sprayed or gargled.  Your blood pressure, heart rate, and breathing will be watched.  You may be asked to lie on your left side.  A bite block will be placed in your mouth. This keeps you from biting the tube.  The tip of the probe will be placed into the back of your mouth.  You will be asked to swallow.  Your doctor will take pictures of your heart.  The probe and bite block will be taken out after the test is done. The procedure may vary among doctors and hospitals.   What can I expect after the procedure?  You will be monitored until you leave the hospital or clinic. This includes checking your blood pressure, heart rate, breathing rate, and blood oxygen level.  Your throat may feel sore and numb. This will get better over time. You will not be allowed to eat or drink until the numbness has gone away.  It is common to have a sore throat for a day or two.  It  is up to you to get the results of your procedure. Ask how to get your results when they are ready. Follow these instructions at home:  If you were given a sedative during your procedure, do not drive or use machines until your doctor says that it is safe.  Return to your normal activities when your doctor says that it is safe.  Keep all follow-up visits. Summary  TEE is a test that uses sound waves to take pictures of your heart.  You will be given a medicine to help you relax.  Do not drive or use machines until your doctor says that it is safe. This information is not intended to replace advice given to you by your health care provider. Make sure you discuss any questions you have with your health care provider. Document Revised: 07/23/2020 Document Reviewed: 07/23/2020 Elsevier Patient Education  Nathaniel [name]. 1. Take regular warfarin dose (3 mg) tonight.  2. Decrease Toprol XL (metoprolol) to 25 mg once daily.

## 2021-01-29 NOTE — Transfer of Care (Signed)
Immediate Anesthesia Transfer of Care Note  Patient: Nathaniel [name].  Procedure(s) Performed: TRANSESOPHAGEAL ECHOCARDIOGRAM (TEE) (N/A ) CARDIOVERSION (N/A )  Patient Location: PACU and Endoscopy Unit  Anesthesia Type:General  Level of Consciousness: awake and drowsy  Airway & Oxygen Therapy: Patient Spontanous Breathing  Post-op Assessment: Report given to RN and Post -op Vital signs reviewed and stable  Post vital signs: Reviewed and stable  Last Vitals:  Vitals Value Taken Time  BP    Temp    Pulse 70 01/29/21 1142  Resp 19 01/29/21 1142  SpO2 97 % 01/29/21 1142  Vitals shown include unvalidated device data.  Last Pain:  Vitals:   01/29/21 1050  TempSrc: Temporal  PainSc: 0-No pain         Complications: No complications documented.

## 2021-01-29 NOTE — Progress Notes (Signed)
  Echocardiogram Echocardiogram Transesophageal has been performed.  Fidel Levy 01/29/2021, 11:53 AM

## 2021-01-30 ENCOUNTER — Encounter (HOSPITAL_COMMUNITY): Payer: Self-pay | Admitting: Cardiology

## 2021-01-31 LAB — GLUCOSE, CAPILLARY: Glucose-Capillary: 127 mg/dL — ABNORMAL HIGH (ref 70–99)

## 2021-02-04 ENCOUNTER — Ambulatory Visit (INDEPENDENT_AMBULATORY_CARE_PROVIDER_SITE_OTHER): Payer: Medicare Other | Admitting: Cardiology

## 2021-02-04 ENCOUNTER — Encounter: Payer: Self-pay | Admitting: Cardiology

## 2021-02-04 ENCOUNTER — Other Ambulatory Visit: Payer: Self-pay

## 2021-02-04 ENCOUNTER — Ambulatory Visit (INDEPENDENT_AMBULATORY_CARE_PROVIDER_SITE_OTHER): Payer: Medicare Other | Admitting: *Deleted

## 2021-02-04 VITALS — BP 122/76 | HR 58 | Ht 70.5 in | Wt 199.4 lb

## 2021-02-04 DIAGNOSIS — I4819 Other persistent atrial fibrillation: Secondary | ICD-10-CM | POA: Diagnosis not present

## 2021-02-04 DIAGNOSIS — I4891 Unspecified atrial fibrillation: Secondary | ICD-10-CM | POA: Diagnosis not present

## 2021-02-04 DIAGNOSIS — Z5181 Encounter for therapeutic drug level monitoring: Secondary | ICD-10-CM

## 2021-02-04 LAB — POCT INR: INR: 2.6 (ref 2.0–3.0)

## 2021-02-04 NOTE — Patient Instructions (Addendum)
Description   Continue taking Warfarin 3 tablets daily except for 2 tablets on Mondays and Fridays. Recheck INR in 2 weeks. Coumadin clinic [phone].

## 2021-02-04 NOTE — Patient Instructions (Signed)
Medication Instructions:  Your physician recommends that you continue on your current medications as directed. Please refer to the Current Medication list given to you today.  *If you need a refill on your cardiac medications before your next appointment, please call your pharmacy*   Lab Work: None ordered   Testing/Procedures: None ordered   Follow-Up: At [facility], you and your health needs are our priority.  As part of our continuing mission to provide you with exceptional heart care, we have created designated Provider Care Teams.  These Care Teams include your primary Cardiologist (physician) and Advanced Practice Providers (APPs -  Physician Assistants and Nurse Practitioners) who all work together to provide you with the care you need, when you need it.   Your next appointment:   3 month(s)  The format for your next appointment:   In Person  Provider:   Allegra Lai, MD    Thank you for choosing [facility]!!   Trinidad Curet, RN [phone]   Other Instructions   Cardiac Ablation Cardiac ablation is a procedure to destroy (ablate) some heart tissue that is sending bad signals. These bad signals cause problems in heart rhythm. The heart has many areas that make these signals. If there are problems in these areas, they can make the heart beat in a way that is not normal. Destroying some tissues can help make the heart rhythm normal. Tell your doctor about:  Any allergies you have.  All medicines you are taking. These include vitamins, herbs, eye drops, creams, and over-the-counter medicines.  Any problems you or family members have had with medicines that make you fall asleep (anesthetics).  Any blood disorders you have.  Any surgeries you have had.  Any medical conditions you have, such as kidney failure.  Whether you are pregnant or may be pregnant. What are the risks? This is a safe procedure. But problems may occur,  including:  Infection.  Bruising and bleeding.  Bleeding into the chest.  Stroke or blood clots.  Damage to nearby areas of your body.  Allergies to medicines or dyes.  The need for a pacemaker if the normal system is damaged.  Failure of the procedure to treat the problem. What happens before the procedure? Medicines Ask your doctor about:  Changing or stopping your normal medicines. This is important.  Taking aspirin and ibuprofen. Do not take these medicines unless your doctor tells you to take them.  Taking other medicines, vitamins, herbs, and supplements. General instructions  Follow instructions from your doctor about what you cannot eat or drink.  Plan to have someone take you home from the hospital or clinic.  If you will be going home right after the procedure, plan to have someone with you for 24 hours.  Ask your doctor what steps will be taken to prevent infection. What happens during the procedure?  An IV tube will be put into one of your veins.  You will be given a medicine to help you relax.  The skin on your neck or groin will be numbed.  A cut (incision) will be made in your neck or groin. A needle will be put through your cut and into a large vein.  A tube (catheter) will be put into the needle. The tube will be moved to your heart.  Dye may be put through the tube. This helps your doctor see your heart.  Small devices (electrodes) on the tube will send out signals.  A type of energy will  be used to destroy some heart tissue.  The tube will be taken out.  Pressure will be held on your cut. This helps stop bleeding.  A bandage will be put over your cut. The exact procedure may vary among doctors and hospitals.   What happens after the procedure?  You will be watched until you leave the hospital or clinic. This includes checking your heart rate, breathing rate, oxygen, and blood pressure.  Your cut will be watched for bleeding. You will  need to lie still for a few hours.  Do not drive for 24 hours or as long as your doctor tells you. Summary  Cardiac ablation is a procedure to destroy some heart tissue. This is done to treat heart rhythm problems.  Tell your doctor about any medical conditions you may have. Tell him or her about all medicines you are taking to treat them.  This is a safe procedure. But problems may occur. These include infection, bruising, bleeding, and damage to nearby areas of your body.  Follow what your doctor tells you about food and drink. You may also be told to change or stop some of your medicines.  After the procedure, do not drive for 24 hours or as long as your doctor tells you. This information is not intended to replace advice given to you by your health care provider. Make sure you discuss any questions you have with your health care provider. Document Revised: 11/02/2019 Document Reviewed: 11/02/2019 Elsevier Patient Education  2021 [employer].

## 2021-02-04 NOTE — Progress Notes (Signed)
Electrophysiology Office Note   Date:  02/04/2021   ID:  Nathaniel [name]., DOB 1932/03/12, MRN [id number]  PCP:  Lorne Skeens, MD  Cardiologist:  Aundra Dubin Primary Electrophysiologist:  Ediberto Sens Meredith Leeds, MD    Chief Complaint: AF   History of Present Illness: Nathaniel [name]. is a 87 y.o. male who is being seen today for the evaluation of AF at the request of Altheimer, [name], MD. Presenting today for electrophysiology evaluation.  He has a history significant for coronary artery disease status post CABG with redo CABG, AAA status post repair, PAD, and carotid stenosis status post right CEA.  He also has a prior AVR.  He presents today for work-up of his atrial fibrillation.    He had an endovascular AAA repair May 2015.  After his repair he was noted to be in atrial fibrillation.  He also had a an episode of atrial fibrillation after cardiac surgery in 2007.  His atrial fibrillation was rate controlled.  He was started on warfarin and Toprol-XL.  He had exertional dyspnea and fatigue.  He was cardioverted 06/06/2014.  His EF was found to be 25 to 30% with a stable bioprosthetic aortic valve and a mildly dilated RV.  He was started on amiodarone and had a repeat cardioversion 08/21/2014.  Unfortunately he developed side effects of amiodarone including hallucinations.  Amiodarone was then stopped.  He was admitted January 2019 with bronchitis and dyspnea and was found to be in atrial fibrillation with rapid rates.  Echo showed an ejection fraction of 45 to 50% with a normal bioprosthetic aortic valve.  He was noted be back in atrial fibrillation June 2020 with rapid rates and mild heart failure.  He was started on metoprolol and Lasix.  He had a cardioversion.  Ejection fraction was found to be 50% of the time.  In 2021, he was started on dofetilide.  And was cardioverted back to sinus rhythm.  He presented to heart failure clinic 01/23/2021 and was unfortunately back in atrial fibrillation  with heart rates in the low 100s.  He feels quite a bit more fatigued in atrial fibrillation.  He also gets short of breath.  Today, he denies symptoms of palpitations, chest pain, shortness of breath, orthopnea, PND, lower extremity edema, claudication, dizziness, presyncope, syncope, bleeding, or neurologic sequela. The patient is tolerating medications without difficulties.    Past Medical History:  Diagnosis Date  . AAA (abdominal aortic aneurysm) ([place])   . Atrial fibrillation ([place])   . Atrial fibrillation ([place])   . CAD (coronary artery disease)   . Cataract   . CHF (congestive heart failure) ([place])   . Chronic kidney disease   . Colon polyps    adenomatous  . Diabetes mellitus   . Diverticulosis   . Dysrhythmia    A-Fib  . GERD (gastroesophageal reflux disease)   . Gout   . Hernia, abdominal   . History of aortic valve replacement with bioprosthetic valve    2007  . Hx: UTI (urinary tract infection)   . Hyperlipemia   . Hypertension   . Internal hemorrhoids   . Peripheral vascular disease ([place])   . Shortness of breath    with exertion   Past Surgical History:  Procedure Laterality Date  . ABDOMINAL AORTIC ENDOVASCULAR STENT GRAFT N/A 04/13/2014   Procedure: ABDOMINAL AORTIC ENDOVASCULAR STENT GRAFT;  Surgeon: Serafina Mitchell, MD;  Location: [place];  Service: Vascular;  Laterality: N/A;  . ANGIOPLASTY  Unsure if he had stents put in   . APPENDECTOMY    . CARDIAC VALVE SURGERY    . CARDIOVERSION N/A 06/06/2014   Procedure: CARDIOVERSION;  Surgeon: Larey Dresser, MD;  Location: [facility] ENDOSCOPY;  Service: Cardiovascular;  Laterality: N/A;  . CARDIOVERSION N/A 08/21/2014   Procedure: CARDIOVERSION;  Surgeon: Larey Dresser, MD;  Location: [place];  Service: Cardiovascular;  Laterality: N/A;  . CARDIOVERSION N/A 12/22/2017   Procedure: CARDIOVERSION;  Surgeon: Larey Dresser, MD;  Location: [facility] ENDOSCOPY;  Service: Cardiovascular;  Laterality: N/A;  . CARDIOVERSION N/A  05/25/2019   Procedure: CARDIOVERSION;  Surgeon: Larey Dresser, MD;  Location: [facility] ENDOSCOPY;  Service: Cardiovascular;  Laterality: N/A;  . CARDIOVERSION N/A 06/14/2019   Procedure: CARDIOVERSION;  Surgeon: Jerline Pain, MD;  Location: [facility] ENDOSCOPY;  Service: Cardiovascular;  Laterality: N/A;  . CARDIOVERSION N/A 12/12/2020   Procedure: CARDIOVERSION;  Surgeon: Larey Dresser, MD;  [facility];  Service: Cardiovascular;  Laterality: N/A;  . CARDIOVERSION N/A 01/29/2021   Procedure: CARDIOVERSION;  Surgeon: Larey Dresser, MD;  Location: [facility] ENDOSCOPY;  Service: Cardiovascular;  Laterality: N/A;  . CORONARY ARTERY BYPASS GRAFT    . ENDARTERECTOMY Right 02/02/2018   Procedure: ENDARTERECTOMY CAROTID RIGHT;  Surgeon: Serafina Mitchell, MD;  Location: [place];  Service: Vascular;  Laterality: Right;  . EYE SURGERY Bilateral    cataract surgery  . HERNIA REPAIR     2 operations  . LEFT HEART CATHETERIZATION WITH CORONARY/GRAFT ANGIOGRAM N/A 07/10/2014   Procedure: LEFT HEART CATHETERIZATION WITH Beatrix Fetters;  Surgeon: Larey Dresser, MD;  Location[facility];  Service: Cardiovascular;  Laterality: N/A;  . TEE WITHOUT CARDIOVERSION N/A 05/25/2019   Procedure: TRANSESOPHAGEAL ECHOCARDIOGRAM (TEE);  Surgeon: Larey Dresser, MD;  Location[facility] ENDOSCOPY;  Service: Cardiovascular;  Laterality: N/A;  . TEE WITHOUT CARDIOVERSION N/A 01/29/2021   Procedure: TRANSESOPHAGEAL ECHOCARDIOGRAM (TEE);  Surgeon: Larey Dresser, MD;  Location: [facility] ENDOSCOPY;  Service: Cardiovascular;  Laterality: N/A;  . TONSILLECTOMY    . VIDEO BRONCHOSCOPY Bilateral 12/25/2015   Procedure: VIDEO BRONCHOSCOPY WITHOUT FLUORO;  Surgeon: Javier Glazier, MD;  Location: [place];  Service: Cardiopulmonary;  Laterality: Bilateral;     Current Outpatient Medications  Medication Sig Dispense Refill  . allopurinol (ZYLOPRIM) 300 MG tablet Take 300 mg by mouth daily.   2  . atorvastatin (LIPITOR) 10 MG  tablet Take 10 mg by mouth every other day. Take 10 mg every other night for a total of 30 mg    . atorvastatin (LIPITOR) 20 MG tablet Take 20 mg by mouth daily.    . Coenzyme Q10 (CO Q10) 200 MG CAPS Take 200 mg by mouth daily.    Marland Kitchen dofetilide (TIKOSYN) 250 MCG capsule TAKE ONE CAPSULE TWICE A DAY 60 capsule 0  . doxazosin (CARDURA) 1 MG tablet Take 1 mg by mouth at bedtime.    Marland Kitchen doxazosin (CARDURA) 2 MG tablet Take 2 mg by mouth every morning.    . empagliflozin (JARDIANCE) 10 MG TABS tablet Take 10 mg by mouth daily.    . Evolocumab (REPATHA SURECLICK) 277 MG/ML SOAJ Inject 1 pen into the skin every 14 (fourteen) days. 2 mL 11  . ezetimibe (ZETIA) 10 MG tablet Take 1 tablet (10 mg total) by mouth at bedtime. 30 tablet 3  . furosemide (LASIX) 20 MG tablet Take 20-40 mg by mouth as directed. Rotating every other day; 20mg  one day and 40mg  the next day    .  latanoprost (XALATAN) 0.005 % ophthalmic solution Place 1 drop into both eyes daily.    Marland Kitchen loratadine-pseudoephedrine ([name] 24-HOUR) 10-240 MG 24 hr tablet Take 1 tablet by mouth daily as needed (seasonal allergies).     . melatonin 3 MG TABS tablet Take 3 mg by mouth at bedtime. 30 min before bed    . metoprolol succinate (TOPROL XL) 25 MG 24 hr tablet Take 1 tablet (25 mg total) by mouth daily. 60 tablet 11  . Misc Natural Products (GLUCOSAMINE CHONDROITIN TRIPLE) TABS Take 1 tablet by mouth daily.    . ramipril (ALTACE) 5 MG capsule Take 2.5-5 mg by mouth See admin instructions. Take 5 mg in the morning and 2.5 mg at bedtime daily    . spironolactone (ALDACTONE) 25 MG tablet TAKE ONE TABLET DAILY 30 tablet 3  . tamsulosin (FLOMAX) 0.4 MG CAPS capsule Take 0.4 mg by mouth 2 (two) times daily.    . timolol (TIMOPTIC) 0.5 % ophthalmic solution Place 1 drop into both eyes at bedtime.  5  . warfarin (COUMADIN) 3 MG tablet Take 2-3 tablets by mouth daily or as instructed by the coumadin clinic. 90 tablet 0   No current facility-administered  medications for this visit.    Allergies:   Grass extracts [gramineae pollens], Milk-related compounds, and Amiodarone   Social History:  The patient  reports that he quit smoking about 28 years ago. His smoking use included cigarettes. He has a 5.00 pack-year smoking history. He has never used smokeless tobacco. He reports current alcohol use of about 10.0 standard drinks of alcohol per week. He reports that he does not use drugs.   Family History:  The patient's family history includes AAA (abdominal aortic aneurysm) in his father; Heart disease in his father and mother.    ROS:  Please see the history of present illness.   Otherwise, review of systems is positive for none.   All other systems are reviewed and negative.    PHYSICAL EXAM: VS:  BP 122/76   Pulse (!) 58   Ht 5' 10.5" (1.791 m)   Wt 199 lb 6.4 oz (90.4 kg)   SpO2 97%   BMI 28.21 kg/m  , BMI Body mass index is 28.21 kg/m. GEN: Well nourished, well developed, in no acute distress  HEENT: normal  Neck: no JVD, carotid bruits, or masses Cardiac: RRR; no murmurs, rubs, or gallops,no edema  Respiratory:  clear to auscultation bilaterally, normal work of breathing GI: soft, nontender, nondistended, + BS MS: no deformity or atrophy  Skin: warm and dry Neuro:  Strength and sensation are intact Psych: euthymic mood, full affect  EKG:  EKG is ordered today. Personal review of the ekg ordered shows sinus rhythm, PVC  Recent Labs: 11/29/2020: Magnesium 1.9 01/01/2021: B Natriuretic Peptide 183.8 01/23/2021: BUN 17; Creatinine, Ser 1.12; Hemoglobin 14.7; Platelets 169; Potassium 4.4; Sodium 140    Lipid Panel     Component Value Date/Time   CHOL 113 01/01/2021 1313   CHOL 96 (L) 11/09/2018 0913   TRIG 96 01/01/2021 1313   TRIG 113 05/08/2009 0000   HDL 49 01/01/2021 1313   HDL 56 11/09/2018 0913   CHOLHDL 2.3 01/01/2021 1313   VLDL 19 01/01/2021 1313   LDLCALC 45 01/01/2021 1313   LDLCALC 14 11/09/2018 0913    LDLDIRECT 98 05/08/2009 0000     Wt Readings from Last 3 Encounters:  02/04/21 199 lb 6.4 oz (90.4 kg)  01/29/21 198 lb (89.8 kg)  01/23/21  201 lb 2 oz (91.2 kg)      Other studies Reviewed: Additional studies/ records that were reviewed today include: TTE 01/01/21  Review of the above records today demonstrates:  1. Left ventricular ejection fraction, by estimation, is 45 to 50%. The  left ventricle has mildly decreased function. The left ventricle  demonstrates global hypokinesis. Left ventricular diastolic parameters are  indeterminate.  2. Right ventricular systolic function is normal. The right ventricular  size is normal. There is normal pulmonary artery systolic pressure. The  estimated right ventricular systolic pressure is 71.2 mmHg.  3. Right atrial size was moderately dilated.  4. Left atrial size was moderately dilated.  5. The mitral valve is normal in structure. Mild mitral valve  regurgitation. No evidence of mitral stenosis. Moderate mitral annular  calcification.  6. Bioprosthetic aortic valve with mean gradient 8 mmHg (no significant  stenosis). No significant regurgitation.  7. Aortic dilatation noted. There is mild dilatation of the ascending  aorta, measuring 37 mm.  8. The inferior vena cava is dilated in size with >50% respiratory  variability, suggesting right atrial pressure of 8 mmHg.  9. The patient was in atrial fibrillation.    ASSESSMENT AND PLAN:  1.  Persistent atrial fibrillation: Unfortunately had side effects with amiodarone.  He is currently on Tikosyn, but has had more episodes of atrial fibrillation.  High risk medication monitoring performed.  CHA2DS2-VASc of 6.  He does not tolerate atrial fibrillation and he goes into heart failure when he is out of rhythm.  I have told him that he would be a candidate for AF ablation, though his risk would be higher.  Risk include bleeding, tamponade, heart block, stroke, damage to chest organs.   He states that he feels well today.  He says that he was under quite a bit of stress each time he went into atrial fibrillation.  He would like to give it a little bit longer to see if he goes back into atrial fibrillation.  If he does, ablation may be reasonable.  2.  Hypertension: Currently well controlled  3.  Coronary artery disease status post CABG: Has had a redo CABG.  Plan per primary cardiology.  4.  Peripheral arterial disease: Followed by vascular surgery.  5.  Chronic systolic heart failure: Likely ischemic in nature.  Currently on optimal medical therapy per heart failure cardiology.  Case discussed with primary cardiology  Current medicines are reviewed at length with the patient today.   The patient does not have concerns regarding his medicines.  The following changes were made today:  none  Labs/ tests ordered today include:  Orders Placed This Encounter  Procedures  . EKG 12-Lead     Disposition:   FU with Raihan Kimmel 3 months  Signed, Isam Unrein Meredith Leeds, MD  02/04/2021 11:56 AM     [facility] [address] [place] [address] [phone] (office) [phone] (fax)

## 2021-02-10 ENCOUNTER — Other Ambulatory Visit: Payer: Self-pay

## 2021-02-10 ENCOUNTER — Encounter: Payer: Self-pay | Admitting: Surgery

## 2021-02-10 ENCOUNTER — Ambulatory Visit (INDEPENDENT_AMBULATORY_CARE_PROVIDER_SITE_OTHER): Payer: Medicare Other | Admitting: Surgery

## 2021-02-10 VITALS — BP 99/60 | HR 67 | Temp 97.9°F | Resp 20 | Ht 70.5 in | Wt 196.0 lb

## 2021-02-10 DIAGNOSIS — I714 Abdominal aortic aneurysm, without rupture, unspecified: Secondary | ICD-10-CM

## 2021-02-10 DIAGNOSIS — I6523 Occlusion and stenosis of bilateral carotid arteries: Secondary | ICD-10-CM | POA: Diagnosis not present

## 2021-02-10 NOTE — Progress Notes (Signed)
[facility] [place]  Patient name: Nathaniel [name]. MRN: [id number] DOB: May 03, 1932 Sex: male   REASON FOR VISIT:    Follow up  HISOTRY OF PRESENT ILLNESS:    Nathaniel [name]. is a 87 y.o. male who is back today for follow-up of his abdominal aortic aneurysm on 04/13/2014.  He is also status post right carotid endarterectomy on 02/02/2018 for a high-grade asymptomatic stenosis.  He recently underwent ablation for AFIB.He remains on Coumadin.  He continues to take a statin for hypercholesterolemia.   PAST MEDICAL HISTORY:   Past Medical History:  Diagnosis Date  . AAA (abdominal aortic aneurysm) ([place])   . Atrial fibrillation ([place])   . Atrial fibrillation ([place])   . CAD (coronary artery disease)   . Cataract   . CHF (congestive heart failure) ([place])   . Chronic kidney disease   . Colon polyps    adenomatous  . Diabetes mellitus   . Diverticulosis   . Dysrhythmia    A-Fib  . GERD (gastroesophageal reflux disease)   . Gout   . Hernia, abdominal   . History of aortic valve replacement with bioprosthetic valve    2007  . Hx: UTI (urinary tract infection)   . Hyperlipemia   . Hypertension   . Internal hemorrhoids   . Peripheral vascular disease ([place])   . Shortness of breath    with exertion     FAMILY HISTORY:   Family History  Problem Relation Age of Onset  . Heart disease Mother   . Heart disease Father        before age 35  . AAA (abdominal aortic aneurysm) Father   . Colon cancer Neg Hx     SOCIAL HISTORY:   Social History   Tobacco Use  . Smoking status: Former Smoker    Packs/day: 0.25    Years: 20.00    Pack years: 5.00    Types: Cigarettes    Quit date: 03/23/1992    Years since quitting: 28.9  . Smokeless tobacco: Never Used  Substance Use Topics  . Alcohol use: Yes    Alcohol/week: 10.0 standard drinks    Types: 10 Glasses of wine per week    Comment: 1-2 drinks daily       ALLERGIES:   Allergies  Allergen Reactions  . Grass Extracts [Gramineae Pollens] Other (See Comments)    Sneezing and runny nose  . Milk-Related Compounds Other (See Comments)    Just Yogurt.  Makes him very lethargic.  Marland Kitchen Amiodarone Anxiety    depression     CURRENT MEDICATIONS:   Current Outpatient Medications  Medication Sig Dispense Refill  . allopurinol (ZYLOPRIM) 300 MG tablet Take 300 mg by mouth daily.   2  . atorvastatin (LIPITOR) 10 MG tablet Take 10 mg by mouth every other day. Take 10 mg every other night for a total of 30 mg    . atorvastatin (LIPITOR) 20 MG tablet Take 20 mg by mouth daily.    . Coenzyme Q10 (CO Q10) 200 MG CAPS Take 200 mg by mouth daily.    Marland Kitchen dofetilide (TIKOSYN) 250 MCG capsule TAKE ONE CAPSULE TWICE A DAY 60 capsule 0  . doxazosin (CARDURA) 1 MG tablet Take 1 mg by mouth at bedtime.    Marland Kitchen doxazosin (CARDURA) 2 MG tablet Take 2 mg by mouth every morning.    . empagliflozin (JARDIANCE) 10 MG TABS tablet Take 10 mg by mouth daily.    Marland Kitchen  Evolocumab (REPATHA SURECLICK) 703 MG/ML SOAJ Inject 1 pen into the skin every 14 (fourteen) days. 2 mL 11  . ezetimibe (ZETIA) 10 MG tablet Take 1 tablet (10 mg total) by mouth at bedtime. 30 tablet 3  . furosemide (LASIX) 20 MG tablet Take 20-40 mg by mouth as directed. Rotating every other day; 20mg  one day and 40mg  the next day    . latanoprost (XALATAN) 0.005 % ophthalmic solution Place 1 drop into both eyes daily.    Marland Kitchen loratadine-pseudoephedrine ([name] 24-HOUR) 10-240 MG 24 hr tablet Take 1 tablet by mouth daily as needed (seasonal allergies).     . melatonin 3 MG TABS tablet Take 3 mg by mouth at bedtime. 30 min before bed    . metoprolol succinate (TOPROL XL) 25 MG 24 hr tablet Take 1 tablet (25 mg total) by mouth daily. 60 tablet 11  . Misc Natural Products (GLUCOSAMINE CHONDROITIN TRIPLE) TABS Take 1 tablet by mouth daily.    . ramipril (ALTACE) 5 MG capsule Take 2.5-5 mg by mouth See admin  instructions. Take 5 mg in the morning and 2.5 mg at bedtime daily    . spironolactone (ALDACTONE) 25 MG tablet TAKE ONE TABLET DAILY 30 tablet 3  . tamsulosin (FLOMAX) 0.4 MG CAPS capsule Take 0.4 mg by mouth 2 (two) times daily.    . timolol (TIMOPTIC) 0.5 % ophthalmic solution Place 1 drop into both eyes at bedtime.  5  . warfarin (COUMADIN) 3 MG tablet Take 2-3 tablets by mouth daily or as instructed by the coumadin clinic. 90 tablet 0   No current facility-administered medications for this visit.    REVIEW OF SYSTEMS:   [X]  denotes positive finding, [ ]  denotes negative finding Cardiac  Comments:  Chest pain or chest pressure:    Shortness of breath upon exertion:    Short of breath when lying flat:    Irregular heart rhythm:        Vascular    Pain in calf, thigh, or hip brought on by ambulation:    Pain in feet at night that wakes you up from your sleep:     Blood clot in your veins:    Leg swelling:         Pulmonary    Oxygen at home:    Productive cough:     Wheezing:         Neurologic    Sudden weakness in arms or legs:     Sudden numbness in arms or legs:     Sudden onset of difficulty speaking or slurred speech:    Temporary loss of vision in one eye:     Problems with dizziness:         Gastrointestinal    Blood in stool:     Vomited blood:         Genitourinary    Burning when urinating:     Blood in urine:        Psychiatric    Major depression:         Hematologic    Bleeding problems:    Problems with blood clotting too easily:        Skin    Rashes or ulcers:        Constitutional    Fever or chills:      PHYSICAL EXAM:   Vitals:   02/10/21 1434 02/10/21 1438  BP: 110/75 99/60  Pulse: 67   Resp: 20   Temp: 97.9 F (  36.6 C)   SpO2: 96%   Weight: 88.9 kg   Height: 5' 10.5" (1.791 m)     GENERAL: The patient is a well-nourished male, in no acute distress. The vital signs are documented above. CARDIAC: There is a regular rate  and rhythm.  VASCULAR: non-palpable pedal pulses PULMONARY: Non-labored respirations ABDOMEN: Soft and non-tender with normal pitched bowel sounds.  MUSCULOSKELETAL: There are no major deformities or cyanosis. NEUROLOGIC: No focal weakness or paresthesias are detected. SKIN: There are no ulcers or rashes noted. PSYCHIATRIC: The patient has a normal affect.  STUDIES:   I have reviewed the following: Endovascular Aortic Repair (EVAR):  +----------+----------------+-------------------+-------------------+       Diameter AP (cm)Diameter Trans (cm)Velocities (cm/sec)  +----------+----------------+-------------------+-------------------+  Aorta   3.58      4.11        31           +----------+----------------+-------------------+-------------------+  Right Limb1.50      1.74        58           +----------+----------------+-------------------+-------------------+  Left Limb 1.46      1.48        79           +----------+----------------+-------------------+-------------------+      Summary:  Abdominal Aorta: Patent endovascular aneurysm repair with no evidence of  endoleak.    CAROTID:  <39% stenosis bilateral MEDICAL ISSUES:   AAA:  Maximum diameter is 4.1 cm with tno endoleak.  F/U duplex in 1 year  Carotid:  Surveillance duplex in 6 1 year  Leia Alf, MD, FACS [facility] [phone] Pager [phone]

## 2021-02-18 ENCOUNTER — Other Ambulatory Visit: Payer: Self-pay

## 2021-02-18 ENCOUNTER — Ambulatory Visit (INDEPENDENT_AMBULATORY_CARE_PROVIDER_SITE_OTHER): Payer: Medicare Other

## 2021-02-18 DIAGNOSIS — I4891 Unspecified atrial fibrillation: Secondary | ICD-10-CM

## 2021-02-18 DIAGNOSIS — Z5181 Encounter for therapeutic drug level monitoring: Secondary | ICD-10-CM

## 2021-02-18 LAB — POCT INR: INR: 4 — AB (ref 2.0–3.0)

## 2021-02-18 NOTE — Patient Instructions (Signed)
-   Since you've already taken your warfarin today, please have a serving of greens today,  - skip warfarin tomorrow, then  - continue taking Warfarin 3 tablets daily except for 2 tablets on Mondays and Fridays.  - Recheck INR in 2 weeks.  Coumadin clinic [phone].

## 2021-02-20 ENCOUNTER — Other Ambulatory Visit: Payer: Self-pay

## 2021-02-20 ENCOUNTER — Emergency Department (HOSPITAL_COMMUNITY)
Admission: EM | Admit: 2021-02-20 | Discharge: 2021-02-20 | Disposition: A | Discharge status: Home / Self Care | Payer: Medicare Other | Attending: Emergency Medicine | Admitting: Emergency Medicine

## 2021-02-20 ENCOUNTER — Encounter (HOSPITAL_COMMUNITY): Payer: Self-pay | Admitting: Nurse Practitioner

## 2021-02-20 ENCOUNTER — Emergency Department (HOSPITAL_COMMUNITY): Payer: Medicare Other

## 2021-02-20 ENCOUNTER — Encounter (HOSPITAL_COMMUNITY): Payer: Self-pay | Admitting: *Deleted

## 2021-02-20 ENCOUNTER — Ambulatory Visit (HOSPITAL_BASED_OUTPATIENT_CLINIC_OR_DEPARTMENT_OTHER)
Admission: RE | Admit: 2021-02-20 | Discharge: 2021-02-20 | Disposition: A | Discharge status: Home / Self Care | Payer: Medicare Other | Source: Ambulatory Visit | Attending: Nurse Practitioner | Admitting: Nurse Practitioner

## 2021-02-20 VITALS — BP 82/50 | HR 125 | Ht 70.5 in | Wt 196.0 lb

## 2021-02-20 DIAGNOSIS — I959 Hypotension, unspecified: Secondary | ICD-10-CM | POA: Diagnosis not present

## 2021-02-20 DIAGNOSIS — Z20822 Contact with and (suspected) exposure to covid-19: Secondary | ICD-10-CM | POA: Insufficient documentation

## 2021-02-20 DIAGNOSIS — I4891 Unspecified atrial fibrillation: Secondary | ICD-10-CM

## 2021-02-20 DIAGNOSIS — Z79899 Other long term (current) drug therapy: Secondary | ICD-10-CM | POA: Diagnosis not present

## 2021-02-20 DIAGNOSIS — Z7901 Long term (current) use of anticoagulants: Secondary | ICD-10-CM | POA: Insufficient documentation

## 2021-02-20 DIAGNOSIS — Z87891 Personal history of nicotine dependence: Secondary | ICD-10-CM | POA: Insufficient documentation

## 2021-02-20 DIAGNOSIS — Z951 Presence of aortocoronary bypass graft: Secondary | ICD-10-CM | POA: Insufficient documentation

## 2021-02-20 DIAGNOSIS — I2510 Atherosclerotic heart disease of native coronary artery without angina pectoris: Secondary | ICD-10-CM | POA: Diagnosis not present

## 2021-02-20 DIAGNOSIS — J3489 Other specified disorders of nose and nasal sinuses: Secondary | ICD-10-CM | POA: Insufficient documentation

## 2021-02-20 DIAGNOSIS — E1122 Type 2 diabetes mellitus with diabetic chronic kidney disease: Secondary | ICD-10-CM | POA: Diagnosis not present

## 2021-02-20 DIAGNOSIS — Z95810 Presence of automatic (implantable) cardiac defibrillator: Secondary | ICD-10-CM | POA: Insufficient documentation

## 2021-02-20 DIAGNOSIS — I130 Hypertensive heart and chronic kidney disease with heart failure and stage 1 through stage 4 chronic kidney disease, or unspecified chronic kidney disease: Secondary | ICD-10-CM | POA: Insufficient documentation

## 2021-02-20 DIAGNOSIS — I480 Paroxysmal atrial fibrillation: Secondary | ICD-10-CM | POA: Diagnosis not present

## 2021-02-20 DIAGNOSIS — N189 Chronic kidney disease, unspecified: Secondary | ICD-10-CM | POA: Insufficient documentation

## 2021-02-20 DIAGNOSIS — Z8249 Family history of ischemic heart disease and other diseases of the circulatory system: Secondary | ICD-10-CM | POA: Diagnosis not present

## 2021-02-20 DIAGNOSIS — D6869 Other thrombophilia: Secondary | ICD-10-CM | POA: Diagnosis not present

## 2021-02-20 DIAGNOSIS — I5043 Acute on chronic combined systolic (congestive) and diastolic (congestive) heart failure: Secondary | ICD-10-CM | POA: Insufficient documentation

## 2021-02-20 LAB — URINALYSIS, ROUTINE W REFLEX MICROSCOPIC
Bilirubin Urine: NEGATIVE
Glucose, UA: >=500 mg/dL — AB
Hgb urine dipstick: NEGATIVE
Ketones, ur: NEGATIVE mg/dL
Leukocytes,Ua: NEGATIVE
Nitrite: NEGATIVE
Protein, ur: NEGATIVE mg/dL
Specific Gravity, Urine: 1.013 (ref 1.005–1.030)
pH: 5 (ref 5.0–8.0)

## 2021-02-20 LAB — COMPREHENSIVE METABOLIC PANEL
ALT: 22 U/L (ref 0–44)
AST: 23 U/L (ref 15–41)
Albumin: 3.8 g/dL (ref 3.5–5.0)
Alkaline Phosphatase: 59 U/L (ref 38–126)
Anion gap: 9 (ref 5–15)
BUN: 19 mg/dL (ref 8–23)
CO2: 23 mmol/L (ref 22–32)
Calcium: 8.8 mg/dL — ABNORMAL LOW (ref 8.9–10.3)
Chloride: 104 mmol/L (ref 98–111)
Creatinine, Ser: 1.28 mg/dL — ABNORMAL HIGH (ref 0.61–1.24)
GFR, Estimated: 55 mL/min — ABNORMAL LOW (ref >60)
Glucose, Bld: 130 mg/dL — ABNORMAL HIGH (ref 70–99)
Potassium: 4.1 mmol/L (ref 3.5–5.1)
Sodium: 136 mmol/L (ref 135–145)
Total Bilirubin: 1.1 mg/dL (ref 0.3–1.2)
Total Protein: 6.4 g/dL — ABNORMAL LOW (ref 6.5–8.1)

## 2021-02-20 LAB — CBC WITH DIFFERENTIAL/PLATELET
Abs Immature Granulocytes: 0.03 10*3/uL (ref 0.00–0.07)
Basophils Absolute: 0 10*3/uL (ref 0.0–0.1)
Basophils Relative: 1 %
Eosinophils Absolute: 0.2 10*3/uL (ref 0.0–0.5)
Eosinophils Relative: 2 %
HCT: 45.1 % (ref 39.0–52.0)
Hemoglobin: 14.7 g/dL (ref 13.0–17.0)
Immature Granulocytes: 0 %
Lymphocytes Relative: 18 %
Lymphs Abs: 1.3 10*3/uL (ref 0.7–4.0)
MCH: 29.9 pg (ref 26.0–34.0)
MCHC: 32.6 g/dL (ref 30.0–36.0)
MCV: 91.9 fL (ref 80.0–100.0)
Monocytes Absolute: 0.6 10*3/uL (ref 0.1–1.0)
Monocytes Relative: 8 %
Neutro Abs: 5.2 10*3/uL (ref 1.7–7.7)
Neutrophils Relative %: 71 %
Platelets: 212 10*3/uL (ref 150–400)
RBC: 4.91 MIL/uL (ref 4.22–5.81)
RDW: 12.7 % (ref 11.5–15.5)
WBC: 7.4 10*3/uL (ref 4.0–10.5)
nRBC: 0 % (ref 0.0–0.2)

## 2021-02-20 LAB — TROPONIN I (HIGH SENSITIVITY)
Troponin I (High Sensitivity): 9 ng/L (ref <18)
Troponin I (High Sensitivity): 8 ng/L (ref <18)

## 2021-02-20 LAB — I-STAT CHEM 8, ED
BUN: 21 mg/dL (ref 8–23)
Calcium, Ion: 0.94 mmol/L — ABNORMAL LOW (ref 1.15–1.40)
Chloride: 105 mmol/L (ref 98–111)
Creatinine, Ser: 1.2 mg/dL (ref 0.61–1.24)
Glucose, Bld: 130 mg/dL — ABNORMAL HIGH (ref 70–99)
HCT: 44 % (ref 39.0–52.0)
Hemoglobin: 15 g/dL (ref 13.0–17.0)
Potassium: 4 mmol/L (ref 3.5–5.1)
Sodium: 135 mmol/L (ref 135–145)
TCO2: 22 mmol/L (ref 22–32)

## 2021-02-20 LAB — RESP PANEL BY RT-PCR (FLU A&B, COVID) ARPGX2
Influenza A by PCR: NEGATIVE
Influenza B by PCR: NEGATIVE
SARS Coronavirus 2 by RT PCR: NEGATIVE

## 2021-02-20 LAB — BRAIN NATRIURETIC PEPTIDE: B Natriuretic Peptide: 217.9 pg/mL — ABNORMAL HIGH (ref 0.0–100.0)

## 2021-02-20 LAB — PROTIME-INR
INR: 2.3 — ABNORMAL HIGH (ref 0.8–1.2)
Prothrombin Time: 24.7 seconds — ABNORMAL HIGH (ref 11.4–15.2)

## 2021-02-20 LAB — LACTIC ACID, PLASMA
Lactic Acid, Venous: 1.6 mmol/L (ref 0.5–1.9)
Lactic Acid, Venous: 1.5 mmol/L (ref 0.5–1.9)

## 2021-02-20 MED ORDER — FENTANYL CITRATE (PF) 100 MCG/2ML IJ SOLN
25.0000 ug | Freq: Once | INTRAMUSCULAR | Status: AC
  Administered 2021-02-20: 25 ug via INTRAVENOUS
  Filled 2021-02-20: qty 2

## 2021-02-20 MED ORDER — ETOMIDATE 2 MG/ML IV SOLN
8.0000 mg | Freq: Once | INTRAVENOUS | Status: AC
  Administered 2021-02-20: 8 mg via INTRAVENOUS
  Filled 2021-02-20: qty 10

## 2021-02-20 MED ORDER — SODIUM CHLORIDE 0.9 % IV BOLUS
1000.0000 mL | Freq: Once | INTRAVENOUS | Status: AC
  Administered 2021-02-20: 1000 mL via INTRAVENOUS

## 2021-02-20 NOTE — ED Provider Notes (Signed)
[facility] EMERGENCY DEPARTMENT Provider Note   CSN: [id number] Arrival date & time: 02/20/21  0923     History Chief Complaint  Patient presents with  . Atrial Fibrillation    Nathaniel Bethel Elmor Kost. is a 87 y.o. male.  HPI      87 year old male with history of atrial fibrillation, CHF, history of aortic valve replacement with bioprosthetic valve in 2007, hypertension, hyperlipidemia, peripheral arterial disease, carotid artery disease, coronary artery disease, diabetes who presents from atrial fibrillation clinic with atrial fibrillation and hypotension.   He had a cardioversion in February and saw Dr. Curt Bears and was in sinus rhythm, however today he arrived to afib clinic for appointment and was lightheaded and found to be in atrial fibrillation with rates in the 120s and blood pressure 82/50.  Per cardiology note, he has had an escalation of his atrial fibrillation over the last several months despite being on Tikosyn, but has been currently tolerating beta-blockers which he had not been able to tolerate in the past.  Patient reports that early this morning/late last night he had one episode of lightheadedness that resolved after he sat down and drink some water.  He reports that he will occasionally have episodes like this.  He then felt improved, however again began to feel lightheaded when he went to his cardiology appointment this morning.  On arrival to the cardiology appointment he was found to be hypotensive and near syncopal.  He was sent to the emergency department for further evaluation.  He reports that he has been eating and drinking normally, denies nausea, vomiting, diarrhea, black or bloody stools, fevers, shortness of breath, chest pain.  Reports that this time he feels essentially back to baseline.  Denies any acute change in medications with the exception that he has been taking [name] for the last couple of days for allergy symptoms which she  describes as very mild cough and rhinorrhea.  He was not aware that [name] could cause atrial fibrillation.     Past Medical History:  Diagnosis Date  . AAA (abdominal aortic aneurysm) ([place])   . Atrial fibrillation ([place])   . Atrial fibrillation ([place])   . CAD (coronary artery disease)   . Cataract   . CHF (congestive heart failure) ([place])   . Chronic kidney disease   . Colon polyps    adenomatous  . Diabetes mellitus   . Diverticulosis   . Dysrhythmia    A-Fib  . GERD (gastroesophageal reflux disease)   . Gout   . Hernia, abdominal   . History of aortic valve replacement with bioprosthetic valve    2007  . Hx: UTI (urinary tract infection)   . Hyperlipemia   . Hypertension   . Internal hemorrhoids   . Peripheral vascular disease ([place])   . Shortness of breath    with exertion    Patient Active Problem List   Diagnosis Date Noted  . Afib ([place]) 06/12/2019  . Asymptomatic carotid artery stenosis without infarction, right 02/02/2018  . Lung infiltrate 01/06/2018  . Acute bronchiolitis due to human metapneumovirus 01/06/2018  . Acute on chronic combined systolic and diastolic CHF (congestive heart failure) ([place])   . Cough 12/19/2017  . Acute bronchitis 12/19/2017  . Solitary pulmonary nodule 03/23/2016  . Pulmonary nodule 12/25/2015  . Aspiration of foreign body   . Lung nodule < 6cm on CT   . Foreign body aspiration 12/23/2015  . Encounter for therapeutic drug monitoring 03/28/2015  . Chronic  systolic CHF (congestive heart failure) ([place]) 06/12/2014  . Chronic diastolic CHF (congestive heart failure) ([place]) 04/23/2014  . Atrial fibrillation with rapid ventricular response ([place]) 04/20/2014  . AAA (abdominal aortic aneurysm) without rupture ([place]) 04/13/2014  . Occlusion and stenosis of carotid artery without mention of cerebral infarction 04/09/2014  . CAD (coronary artery disease) of artery bypass graft 09/14/2012  . Abdominal aortic aneurysm ([place]) 09/14/2012  .  Peripheral vascular disease with claudication ([place]) 09/14/2012  . Carotid artery disease without cerebral infarction ([place]) 09/14/2012  . History of aortic valve replacement with bioprosthetic valve 09/14/2012  . Essential hypertension, malignant 09/14/2012  . Hyperlipidemia with target LDL less than 70 09/14/2012  . ABDOMINAL AORTIC ANEURYSM 07/06/2009  . CEREBROVASCULAR DISEASE, HX OF 07/06/2009    Past Surgical History:  Procedure Laterality Date  . ABDOMINAL AORTIC ENDOVASCULAR STENT GRAFT N/A 04/13/2014   Procedure: ABDOMINAL AORTIC ENDOVASCULAR STENT GRAFT;  Surgeon: Serafina Mitchell, MD;  Location: [place];  Service: Vascular;  Laterality: N/A;  . ANGIOPLASTY     Unsure if he had stents put in   . APPENDECTOMY    . CARDIAC VALVE SURGERY    . CARDIOVERSION N/A 06/06/2014   Procedure: CARDIOVERSION;  Surgeon: Larey Dresser, MD;  Location: [facility] ENDOSCOPY;  Service: Cardiovascular;  Laterality: N/A;  . CARDIOVERSION N/A 08/21/2014   Procedure: CARDIOVERSION;  Surgeon: Larey Dresser, MD;  Location: [place];  Service: Cardiovascular;  Laterality: N/A;  . CARDIOVERSION N/A 12/22/2017   Procedure: CARDIOVERSION;  Surgeon: Larey Dresser, MD;  Location: [facility] ENDOSCOPY;  Service: Cardiovascular;  Laterality: N/A;  . CARDIOVERSION N/A 05/25/2019   Procedure: CARDIOVERSION;  Surgeon: Larey Dresser, MD;  Location: [facility] ENDOSCOPY;  Service: Cardiovascular;  Laterality: N/A;  . CARDIOVERSION N/A 06/14/2019   Procedure: CARDIOVERSION;  Surgeon: Jerline Pain, MD;  Location: [facility] ENDOSCOPY;  Service: Cardiovascular;  Laterality: N/A;  . CARDIOVERSION N/A 12/12/2020   Procedure: CARDIOVERSION;  Surgeon: Larey Dresser, MD;  Location: [facility] ENDOSCOPY;  Service: Cardiovascular;  Laterality: N/A;  . CARDIOVERSION N/A 01/29/2021   Procedure: CARDIOVERSION;  Surgeon: Larey Dresser, MD;  Location: [facility] ENDOSCOPY;  Service: Cardiovascular;  Laterality: N/A;  . CORONARY ARTERY BYPASS GRAFT    . ENDARTERECTOMY  Right 02/02/2018   Procedure: ENDARTERECTOMY CAROTID RIGHT;  Surgeon: Serafina Mitchell, MD;  Location: [place];  Service: Vascular;  Laterality: Right;  . EYE SURGERY Bilateral    cataract surgery  . HERNIA REPAIR     2 operations  . LEFT HEART CATHETERIZATION WITH CORONARY/GRAFT ANGIOGRAM N/A 07/10/2014   Procedure: LEFT HEART CATHETERIZATION WITH Beatrix Fetters;  Surgeon: Larey Dresser, MD;  Location: [facility] CATH LAB;  Service: Cardiovascular;  Laterality: N/A;  . TEE WITHOUT CARDIOVERSION N/A 05/25/2019   Procedure: TRANSESOPHAGEAL ECHOCARDIOGRAM (TEE);  Surgeon: Larey Dresser, MD;  Location: [facility] ENDOSCOPY;  Service: Cardiovascular;  Laterality: N/A;  . TEE WITHOUT CARDIOVERSION N/A 01/29/2021   Procedure: TRANSESOPHAGEAL ECHOCARDIOGRAM (TEE);  Surgeon: Larey Dresser, MD;  [facility]COPY;  Service: Cardiovascular;  Laterality: N/A;  . TONSILLECTOMY    . VIDEO BRONCHOSCOPY Bilateral 12/25/2015   Procedure: VIDEO BRONCHOSCOPY WITHOUT FLUORO;  Surgeon: Javier Glazier, MD;  Location: [place];  Service: Cardiopulmonary;  Laterality: Bilateral;       Family History  Problem Relation Age of Onset  . Heart disease Mother   . Heart disease Father        before age 3  . AAA (abdominal aortic aneurysm) Father   .  Colon cancer Neg Hx     Social History   Tobacco Use  . Smoking status: Former Smoker    Packs/day: 0.25    Years: 20.00    Pack years: 5.00    Types: Cigarettes    Quit date: 03/23/1992    Years since quitting: 28.9  . Smokeless tobacco: Never Used  Vaping Use  . Vaping Use: Never used  Substance Use Topics  . Alcohol use: Yes    Alcohol/week: 10.0 standard drinks    Types: 10 Glasses of wine per week    Comment: 1-2 drinks daily   . Drug use: No    Home Medications Prior to Admission medications   Medication Sig Start Date End Date Taking? Authorizing Provider  allopurinol (ZYLOPRIM) 300 MG tablet Take 300 mg by mouth daily.  05/13/17    [provider]  atorvastatin (LIPITOR) 10 MG tablet Take 10 mg by mouth every other day. Take 10 mg every other night for a total of 30 mg    [provider]  atorvastatin (LIPITOR) 20 MG tablet Take 20 mg by mouth daily.    [provider]  Coenzyme Q10 (CO Q10) 200 MG CAPS Take 200 mg by mouth daily.    [provider]  dofetilide (TIKOSYN) 250 MCG capsule TAKE ONE CAPSULE TWICE A DAY 01/29/21   Larey Dresser, MD  doxazosin (CARDURA) 1 MG tablet Take 1 mg by mouth at bedtime.    [provider]  doxazosin (CARDURA) 2 MG tablet Take 2 mg by mouth every morning.    [provider]  empagliflozin (JARDIANCE) 10 MG TABS tablet Take 10 mg by mouth daily. 07/14/18   [provider]  Evolocumab (REPATHA SURECLICK) 950 MG/ML SOAJ Inject 1 pen into the skin every 14 (fourteen) days. 09/11/20   Larey Dresser, MD  ezetimibe (ZETIA) 10 MG tablet Take 1 tablet (10 mg total) by mouth at bedtime. 01/01/21   Larey Dresser, MD  furosemide (LASIX) 20 MG tablet Take 20-40 mg by mouth as directed. Rotating every other day; 20mg  one day and 40mg  the next day    [provider]  latanoprost (XALATAN) 0.005 % ophthalmic solution Place 1 drop into both eyes daily.    [provider]  loratadine-pseudoephedrine ([name] 24-HOUR) 10-240 MG 24 hr tablet Take 1 tablet by mouth daily as needed (seasonal allergies).     [provider]  melatonin 3 MG TABS tablet Take 3 mg by mouth at bedtime. 30 min before bed    [provider]  metoprolol succinate (TOPROL XL) 25 MG 24 hr tablet Take 1 tablet (25 mg total) by mouth daily. Patient taking differently: Take 25 mg by mouth 2 (two) times daily. 01/29/21   Larey Dresser, MD  Misc Natural Products (GLUCOSAMINE CHONDROITIN TRIPLE) TABS Take 1 tablet by mouth daily.    [provider]  ramipril (ALTACE) 5 MG capsule Take 2.5-5 mg by mouth See admin instructions. Take  5 mg in the morning and 2.5 mg at bedtime daily    [provider]  spironolactone (ALDACTONE) 25 MG tablet TAKE ONE TABLET DAILY 11/18/20   Larey Dresser, MD  tamsulosin (FLOMAX) 0.4 MG CAPS capsule Take 0.4 mg by mouth 2 (two) times daily.    [provider]  timolol (TIMOPTIC) 0.5 % ophthalmic solution Place 1 drop into both eyes at bedtime. 05/13/17   [provider]  warfarin (COUMADIN) 3 MG tablet Take 2-3  tablets by mouth daily or as instructed by the coumadin clinic. 01/29/21   Larey Dresser, MD    Allergies    Grass extracts [gramineae pollens], Milk-related compounds, and Amiodarone  Review of Systems   Review of Systems  Constitutional: Negative for fever.  HENT: Positive for rhinorrhea. Negative for sore throat.   Eyes: Negative for visual disturbance.  Respiratory: Negative for shortness of breath. Cough: mild.   Cardiovascular: Negative for chest pain.  Gastrointestinal: Negative for abdominal pain, diarrhea, nausea and vomiting.  Genitourinary: Negative for difficulty urinating and dysuria.  Musculoskeletal: Negative for back pain and neck stiffness.  Skin: Negative for rash.  Neurological: Positive for light-headedness. Negative for syncope and headaches.    Physical Exam Updated Vital Signs BP 135/70   Pulse 77   Temp 97.9 F (36.6 C) (Oral)   Resp 19   Ht 5' 10.5" (1.791 m)   Wt 88.9 kg   SpO2 98%   BMI 27.73 kg/m   Physical Exam Vitals and nursing note reviewed.  Constitutional:      General: He is not in acute distress.    Appearance: He is well-developed. He is not diaphoretic.  HENT:     Head: Normocephalic and atraumatic.  Eyes:     Conjunctiva/sclera: Conjunctivae normal.  Cardiovascular:     Rate and Rhythm: Normal rate and regular rhythm.     Heart sounds: Normal heart sounds. No murmur heard. No friction rub. No gallop.   Pulmonary:     Effort: Pulmonary effort is normal. No respiratory distress.     Breath  sounds: Normal breath sounds. No wheezing or rales.  Abdominal:     General: There is no distension.     Palpations: Abdomen is soft.     Tenderness: There is no abdominal tenderness. There is no guarding.  Musculoskeletal:     Cervical back: Normal range of motion.  Skin:    General: Skin is warm and dry.  Neurological:     Mental Status: He is alert and oriented to person, place, and time.     ED Results / Procedures / Treatments   Labs (all labs ordered are listed, but only abnormal results are displayed) Labs Reviewed  COMPREHENSIVE METABOLIC PANEL - Abnormal; Notable for the following components:      Result Value   Glucose, Bld 130 (*)    Creatinine, Ser 1.28 (*)    Calcium 8.8 (*)    Total Protein 6.4 (*)    GFR, Estimated 55 (*)    All other components within normal limits  PROTIME-INR - Abnormal; Notable for the following components:   Prothrombin Time 24.7 (*)    INR 2.3 (*)    All other components within normal limits  BRAIN NATRIURETIC PEPTIDE - Abnormal; Notable for the following components:   B Natriuretic Peptide 217.9 (*)    All other components within normal limits  URINALYSIS, ROUTINE W REFLEX MICROSCOPIC - Abnormal; Notable for the following components:   Glucose, UA >=500 (*)    Bacteria, UA RARE (*)    All other components within normal limits  I-STAT CHEM 8, ED - Abnormal; Notable for the following components:   Glucose, Bld 130 (*)    Calcium, Ion 0.94 (*)    All other components within normal limits  RESP PANEL BY RT-PCR (FLU A&B, COVID) ARPGX2  URINE CULTURE  CBC WITH DIFFERENTIAL/PLATELET  LACTIC ACID, PLASMA  LACTIC ACID, PLASMA  TROPONIN I (HIGH SENSITIVITY)  TROPONIN I (HIGH  SENSITIVITY)    EKG EKG Interpretation  Date/Time:  Thursday February 20 2021 10:18:09 EST Ventricular Rate:  99 PR Interval:    QRS Duration: 114 QT Interval:  392 QTC Calculation: 504 R Axis:   79 Text Interpretation: Atrial fibrillation Incomplete left  bundle branch block Prolonged QT interval No significant change since last tracing Confirmed by Gareth Morgan [phone]) on 02/20/2021 1:59:47 PM   Radiology DG Chest Portable 1 View  Result Date: 02/20/2021 CLINICAL DATA:  Hypotension EXAM: PORTABLE CHEST 1 VIEW COMPARISON:  None. FINDINGS: Sternotomy wires overlie normal cardiac silhouette. No effusion, infiltrate or pneumothorax. Lungs are hyperinflated. No interval change. IMPRESSION: Hyperinflated lungs.  No acute findings. Electronically Signed   By: Suzy Bouchard M.D.   On: 02/20/2021 10:43    Procedures .Critical Care Performed by: Gareth Morgan, MD Authorized by: Gareth Morgan, MD   Critical care provider statement:    Critical care time (minutes):  45   Critical care was time spent personally by me on the following activities:  Discussions with consultants, evaluation of patient's response to treatment, examination of patient, ordering and performing treatments and interventions, ordering and review of laboratory studies, ordering and review of radiographic studies, pulse oximetry, re-evaluation of patient's condition, obtaining history from patient or surrogate and review of old charts .Sedation  Date/Time: 02/20/2021 11:13 PM Performed by: Gareth Morgan, MD Authorized by: Gareth Morgan, MD   Consent:    Consent obtained:  Verbal   Consent given by:  Patient   Risks discussed:  Allergic reaction, dysrhythmia, inadequate sedation, nausea, prolonged hypoxia resulting in organ damage, prolonged sedation necessitating reversal, respiratory compromise necessitating ventilatory assistance and intubation and vomiting   Alternatives discussed:  Analgesia without sedation, anxiolysis and regional anesthesia Universal protocol:    Procedure explained and questions answered to patient or proxy's satisfaction: yes     Relevant documents present and verified: yes     Test results available: yes     Imaging studies  available: yes     Required blood products, implants, devices, and special equipment available: yes     Site/side marked: yes     Immediately prior to procedure, a time out was called: yes     Patient identity confirmed:  Verbally with patient, anonymous protocol, patient vented/unresponsive and arm band Indications:    Procedure performed:  Cardioversion   Procedure necessitating sedation performed by:  Physician performing sedation Pre-sedation assessment:    Time since last food or drink:  8   ASA classification: class 3 - patient with severe systemic disease     Mouth opening:  3 or more finger widths   Thyromental distance:  4 finger widths   Mallampati score:  III - soft palate, base of uvula visible   Neck mobility: normal     Pre-sedation assessments completed and reviewed: airway patency, cardiovascular function, hydration status, mental status, nausea/vomiting, pain level, respiratory function and temperature   Immediate pre-procedure details:    Reassessment: Patient reassessed immediately prior to procedure     Reviewed: vital signs, relevant labs/tests and NPO status     Verified: bag valve mask available, emergency equipment available, intubation equipment available, IV patency confirmed, oxygen available and suction available   Procedure details (see MAR for exact dosages):    Preoxygenation:  Nasal cannula   Sedation:  Propofol and etomidate   Intended level of sedation: deep   Intra-procedure monitoring:  Blood pressure monitoring, cardiac monitor, continuous pulse oximetry, frequent LOC assessments, frequent vital sign  checks and continuous capnometry   Intra-procedure events: none     Total Provider sedation time (minutes):  20 Post-procedure details:    Attendance: Constant attendance by certified staff until patient recovered     Recovery: Patient returned to pre-procedure baseline     Post-sedation assessments completed and reviewed: airway patency, cardiovascular  function, hydration status, mental status, nausea/vomiting, pain level, respiratory function and temperature     Patient is stable for discharge or admission: yes     Procedure completion:  Tolerated well, no immediate complications .Cardioversion  Date/Time: 02/20/2021 11:14 PM Performed by: Gareth Morgan, MD Authorized by: Gareth Morgan, MD   Consent:    Consent obtained:  Written   Consent given by:  Patient   Risks discussed:  Death, cutaneous burn, induced arrhythmia and pain   Alternatives discussed:  No treatment Pre-procedure details:    Cardioversion basis:  Elective   Rhythm:  Atrial fibrillation   Electrode placement:  Anterior-posterior Patient sedated: Yes. Refer to sedation procedure documentation for details of sedation.  Attempt one:    Cardioversion mode:  Synchronous   Shock (Joules):  150   Shock outcome:  Conversion to normal sinus rhythm Post-procedure details:    Patient status:  Alert   Patient tolerance of procedure:  Tolerated well, no immediate complications     Medications Ordered in ED Medications  sodium chloride 0.9 % bolus 1,000 mL (0 mLs Intravenous Stopped 02/20/21 1103)  etomidate (AMIDATE) injection 8 mg (8 mg Intravenous Given 02/20/21 1609)  fentaNYL (SUBLIMAZE) injection 25 mcg (25 mcg Intravenous Given 02/20/21 1607)    ED Course  I have reviewed the triage vital signs and the nursing notes.  Pertinent labs & imaging results that were available during my care of the patient were reviewed by me and considered in my medical decision making (see chart for details).    MDM Rules/Calculators/A&P                          87 year old male with history of atrial fibrillation, CHF, history of aortic valve replacement with bioprosthetic valve in 2007, hypertension, hyperlipidemia, peripheral arterial disease, carotid artery disease, coronary artery disease, diabetes who presents from atrial fibrillation clinic with atrial fibrillation and  hypotension.  No leukocytosis, no infectious symptoms, doubt sepsis as etiology of hypotension. No chest pain or dyspnea, is therapeutic on coumadin and doubt PE. Troponin negative. CXR without pneumonia or CHF. No signs or symptoms to suggest pericardial tamponade, AAA, GI bleed, dissection. Denies being dehydrated however BP improved significantly with 1 L of fluid. Initally discussed possible admission with hospitalist given hypotension to 80s on arrival, and consulted Cardiology.  Cardiology ([place] PA-C in discussion with Dr. Baird Kay and Dr. Aundra Dubin) recommends cardioversion in the ED and discharge home.  Converted to normal sinus rhythm with etomidate sedation. Will continue to observe for one hour post sedation to ensure return to baseline and continued sinus rhythm. UA pending at time of transfer of care.         Final Clinical Impression(s) / ED Diagnoses Final diagnoses:  Atrial fibrillation with RVR (HCC)  Hypotension, unspecified hypotension type    Rx / DC Orders ED Discharge Orders    None       Gareth Morgan, MD 02/20/21 2321

## 2021-02-20 NOTE — ED Notes (Signed)
Patient awake, alert, following commands, talking with the MD.

## 2021-02-20 NOTE — ED Notes (Signed)
Consents for procedural sedation and cardioversion signed by patient and RN.

## 2021-02-20 NOTE — ED Notes (Addendum)
synchronized cardioversion 150 joules with MD and RT at bedside. NSR noted and recorded on monitor following procedure.

## 2021-02-20 NOTE — Progress Notes (Signed)
Patient well known to EP team.   Pt has history of intermittent orthostatic symptoms, worse when in AF. Had lightheadedness this am when standing from couch.   In setting of HRs in 120-130s with activity and BP in upper 80s in AF clinic, pt was sent to ED for urgent cardioversion.   Nothing further to offer from EP team this admission. We recommend cardioversion and home with close outpatient follow up.   If BP remains soft in NSR, can consider decrease of HTN meds.   Above discussed with Dr. Curt Bears and Dr. Aundra Dubin, who was also made aware of patient.   I also personally discussed the above with the patient. He feels this was brought on by claritin-d and still does not feel he is ready for ablation.   Nathaniel [name] [address]" [place], PA-C  02/20/2021 1:20 PM

## 2021-02-20 NOTE — ED Notes (Signed)
Please call grandaughter, [phone], asap

## 2021-02-20 NOTE — Progress Notes (Signed)
Primary Care Physician: Lorne Skeens, MD Referring Physician: Dr. Aundra Dubin EP: Dr. Zachery Conch Lenard Kampf. is a 87 y.o. male with  extensive vascular disease including CAD s/p CABG and redo CABG, AAA s/p repair, PAD, carotid stenosis as  prior AVR , as well as h/o paroxysmal afib, that is in the afib clinic for consideration for Tikosyn admit. He was seen by Dr. Aundra Dubin last week  and found to have ERAF after successful TEE/DCCV  6/11. Pt does  have RVR and has not tolerated BB's  for extreme fatigue or amiodarone in the remote past for suicidal ideations. Dr. 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   F/u in afib clinic, 02/20/21. He has had a cardioversion in February and saw Dr. Curt Bears in f/u and was in [place]. On arrival to clinic  this am, he became dizzy and was slumping forward and the nurse caught him and put in a wheelchair. He was found to be in afib with v rates in the 120's and BP on presentation of 82/50. He also had a dizzy spell at home this am but reports that he has felt good for the last several days, so was surprised that he was back in afib. He has had an escalation of afib over the last several months which he does not tolerate, despite being on tikosyn. He is currently tolerating BB's which he has not been able to tolerate in the past.  However,  he cannot tolerate amiodarone. After sitting, v rates slowed but BP still at 98/60, no room to uptitrate BB. Dr.Camnitz did offer the pt an ablation on his last visit but the pt was undecided. He is on coumadin with a CHA2DS2VASc score of at least 6.  Today, he denies symptoms of palpitations, chest pain, shortness of breath, orthopnea, PND, lower extremity edema, dizziness, presyncope, syncope, or neurologic sequela. The patient is tolerating medications without difficulties and is  otherwise without complaint today.   Past Medical History:  Diagnosis Date  . AAA (abdominal aortic aneurysm) ([place])   . Atrial fibrillation ([place])   . Atrial fibrillation ([place])   . CAD (coronary artery disease)   . Cataract   . CHF (congestive heart failure) ([place])   . Chronic kidney disease   . Colon polyps    adenomatous  . Diabetes mellitus   . Diverticulosis   . Dysrhythmia    A-Fib  . GERD (gastroesophageal reflux disease)   . Gout   . Hernia, abdominal   . History of aortic valve replacement with bioprosthetic valve    2007  . Hx: UTI (urinary tract infection)   . Hyperlipemia   . Hypertension   . Internal hemorrhoids   . Peripheral vascular disease ([place])   . Shortness of breath    with exertion   Past Surgical History:  Procedure Laterality Date  . ABDOMINAL AORTIC ENDOVASCULAR STENT GRAFT N/A 04/13/2014   Procedure: ABDOMINAL AORTIC ENDOVASCULAR STENT GRAFT;  Surgeon: Serafina Mitchell, MD;  Location: [place];  Service: Vascular;  Laterality: N/A;  . ANGIOPLASTY     Unsure if he had stents put in   . APPENDECTOMY    . CARDIAC VALVE SURGERY    . CARDIOVERSION N/A 06/06/2014   Procedure: CARDIOVERSION;  Surgeon: Larey Dresser, MD;  Location: [place];  Service: Cardiovascular;  Laterality: N/A;  . CARDIOVERSION N/A 08/21/2014   Procedure: CARDIOVERSION;  Surgeon: Larey Dresser, MD;  Location: [place];  Service: Cardiovascular;  Laterality: N/A;  . CARDIOVERSION N/A 12/22/2017   Procedure: CARDIOVERSION;  Surgeon: Larey Dresser, MD;  Location: [facility] ENDOSCOPY;  Service: Cardiovascular;  Laterality: N/A;  . CARDIOVERSION N/A 05/25/2019   Procedure: CARDIOVERSION;  Surgeon: Larey Dresser, MD;  Location: [place];  Service: Cardiovascular;  Laterality: N/A;  . CARDIOVERSION N/A 06/14/2019   Procedure: CARDIOVERSION;  Surgeon: Jerline Pain, MD;  Location: [facility] ENDOSCOPY;  Service: Cardiovascular;  Laterality: N/A;  . CARDIOVERSION N/A 12/12/2020   Procedure:  CARDIOVERSION;  Surgeon: Larey Dresser, MD;  Location: [facility] ENDOSCOPY;  Service: Cardiovascular;  Laterality: N/A;  . CARDIOVERSION N/A 01/29/2021   Procedure: CARDIOVERSION;  Surgeon: Larey Dresser, MD;  Location: [facility] ENDOSCOPY;  Service: Cardiovascular;  Laterality: N/A;  . CORONARY ARTERY BYPASS GRAFT    . ENDARTERECTOMY Right 02/02/2018   Procedure: ENDARTERECTOMY CAROTID RIGHT;  Surgeon: Serafina Mitchell, MD;  Location: [place];  Service: Vascular;  Laterality: Right;  . EYE SURGERY Bilateral    cataract surgery  . HERNIA REPAIR     2 operations  . LEFT HEART CATHETERIZATION WITH CORONARY/GRAFT ANGIOGRAM N/A 07/10/2014   Procedure: LEFT HEART CATHETERIZATION WITH Beatrix Fetters;  Surgeon: Larey Dresser, MD;  Location: [facility] CATH LAB;  Service: Cardiovascular;  Laterality: N/A;  . TEE WITHOUT CARDIOVERSION N/A 05/25/2019   Procedure: TRANSESOPHAGEAL ECHOCARDIOGRAM (TEE);  Surgeon: Larey Dresser, MD;  Location: [facility] ENDOSCOPY;  Service: Cardiovascular;  Laterality: N/A;  . TEE WITHOUT CARDIOVERSION N/A 01/29/2021   Procedure: TRANSESOPHAGEAL ECHOCARDIOGRAM (TEE);  Surgeon: Larey Dresser, MD;  Location: [facility] ENDOSCOPY;  Service: Cardiovascular;  Laterality: N/A;  . TONSILLECTOMY    . VIDEO BRONCHOSCOPY Bilateral 12/25/2015   Procedure: VIDEO BRONCHOSCOPY WITHOUT FLUORO;  Surgeon: Javier Glazier, MD;  Location: [place];  Service: Cardiopulmonary;  Laterality: Bilateral;    Current Outpatient Medications  Medication Sig Dispense Refill  . allopurinol (ZYLOPRIM) 300 MG tablet Take 300 mg by mouth daily.   2  . atorvastatin (LIPITOR) 10 MG tablet Take 10 mg by mouth every other day. Take 10 mg every other night for a total of 30 mg    . atorvastatin (LIPITOR) 20 MG tablet Take 20 mg by mouth daily.    . Coenzyme Q10 (CO Q10) 200 MG CAPS Take 200 mg by mouth daily.    Marland Kitchen dofetilide (TIKOSYN) 250 MCG capsule TAKE ONE CAPSULE TWICE A DAY 60 capsule 0  . doxazosin (CARDURA) 1 MG  tablet Take 1 mg by mouth at bedtime.    Marland Kitchen doxazosin (CARDURA) 2 MG tablet Take 2 mg by mouth every morning.    . empagliflozin (JARDIANCE) 10 MG TABS tablet Take 10 mg by mouth daily.    . Evolocumab (REPATHA SURECLICK) 601 MG/ML SOAJ Inject 1 pen into the skin every 14 (fourteen) days. 2 mL 11  . ezetimibe (ZETIA) 10 MG tablet Take 1 tablet (10 mg total) by mouth at bedtime. 30 tablet 3  . furosemide (LASIX) 20 MG tablet Take 20-40 mg by mouth as directed. Rotating every other day; 20mg  one day and 40mg  the next day    . latanoprost (XALATAN) 0.005 % ophthalmic solution Place 1 drop into both eyes daily.    Marland Kitchen loratadine-pseudoephedrine ([name] 24-HOUR) 10-240 MG 24 hr tablet Take 1 tablet by mouth daily as needed (  seasonal allergies).     . melatonin 3 MG TABS tablet Take 3 mg by mouth at bedtime. 30 min before bed    . metoprolol succinate (TOPROL XL) 25 MG 24 hr tablet Take 1 tablet (25 mg total) by mouth daily. 60 tablet 11  . Misc Natural Products (GLUCOSAMINE CHONDROITIN TRIPLE) TABS Take 1 tablet by mouth daily.    . ramipril (ALTACE) 5 MG capsule Take 2.5-5 mg by mouth See admin instructions. Take 5 mg in the morning and 2.5 mg at bedtime daily    . spironolactone (ALDACTONE) 25 MG tablet TAKE ONE TABLET DAILY 30 tablet 3  . tamsulosin (FLOMAX) 0.4 MG CAPS capsule Take 0.4 mg by mouth 2 (two) times daily.    . timolol (TIMOPTIC) 0.5 % ophthalmic solution Place 1 drop into both eyes at bedtime.  5  . warfarin (COUMADIN) 3 MG tablet Take 2-3 tablets by mouth daily or as instructed by the coumadin clinic. 90 tablet 0   No current facility-administered medications for this encounter.    Allergies  Allergen Reactions  . Grass Extracts [Gramineae Pollens] Other (See Comments)    Sneezing and runny nose  . Milk-Related Compounds Other (See Comments)    Just Yogurt.  Makes him very lethargic.  Marland Kitchen Amiodarone Anxiety    depression    Social History   Socioeconomic History  .  Marital status: Widowed    Spouse name: Not on file  . Number of children: Not on file  . Years of education: Not on file  . Highest education level: Not on file  Occupational History  . Occupation: [employer]   Tobacco Use  . Smoking status: Former Smoker    Packs/day: 0.25    Years: 20.00    Pack years: 5.00    Types: Cigarettes    Quit date: 03/23/1992    Years since quitting: 28.9  . Smokeless tobacco: Never Used  Vaping Use  . Vaping Use: Never used  Substance and Sexual Activity  . Alcohol use: Yes    Alcohol/week: 10.0 standard drinks    Types: 10 Glasses of wine per week    Comment: 1-2 drinks daily   . Drug use: No  . Sexual activity: Not on file  Other Topics Concern  . Not on file  Social History Narrative   1 caffeine drink daily    Social Determinants of Health   Financial Resource Strain: Not on file  Food Insecurity: Not on file  Transportation Needs: Not on file  Physical Activity: Not on file  Stress: Not on file  Social Connections: Not on file  Intimate Partner Violence: Not on file    Family History  Problem Relation Age of Onset  . Heart disease Mother   . Heart disease Father        before age 63  . AAA (abdominal aortic aneurysm) Father   . Colon cancer Neg Hx     ROS- All systems are reviewed and negative except as per the HPI above  Physical Exam: There were no vitals filed for this visit. Wt Readings from Last 3 Encounters:  02/10/21 88.9 kg  02/04/21 90.4 kg  01/29/21 89.8 kg    Labs: Lab Results  Component Value Date   NA 140 01/23/2021   K 4.4 01/23/2021   CL 106 01/23/2021   CO2 25 01/23/2021   GLUCOSE 132 (H) 01/23/2021   BUN 17 01/23/2021   CREATININE 1.12 01/23/2021   CALCIUM 8.4 (L) 01/23/2021  PHOS 3.2 12/22/2017   MG 1.9 11/29/2020   Lab Results  Component Value Date   INR 4.0 (A) 02/18/2021   Lab Results  Component Value Date   CHOL 113 01/01/2021   HDL 49 01/01/2021   LDLCALC 45 01/01/2021    TRIG 96 01/01/2021     GEN- The patient is well appearing, alert and oriented x 3 today.   Head- normocephalic, atraumatic Eyes-  Sclera clear, conjunctiva pink Ears- hearing intact Oropharynx- clear Neck- supple, no JVP Lymph- no cervical lymphadenopathy Lungs- Clear to ausculation bilaterally, normal work of breathing Heart-irregular rate and rhythm, no murmurs, rubs or gallops, PMI not laterally displaced GI- soft, NT, ND, + BS Extremities- no clubbing, cyanosis, or edema MS- no significant deformity or atrophy Skin- no rash or lesion Psych- euthymic mood, full affect Neuro- strength and sensation are intact  EKG - afib at 125 bpm on presentation, by pulse ox, his v rates do slow with sitting,to the 80's ,  but BP remains low at 98/60.     Assessment and Plan: 1. Paroxysmal afib with recent DCCV with ERAF with RVR Pt cannot take  amiodarone for intolerance, hallucinations and suicidal ideations (remote use in 2015) Cannot titrate rate control meds for hypotension Pt did dive himself here and as symptomatic as he was on presentation, do not feel comfortable letting him drive home  Spoke to Dr. Aundra Dubin( Dr. Curt Bears in hospital but in a procedure) Dr. Aundra Dubin feels he should go to the ER as  with his h/o of heart failure, he  decompensates quickly  He is on warfarin, no recent missed doses  He will need to discuss ablation again with Dr. Curt Bears    To ER     Geroge Baseman. [place], [facility] [address] [place], [place] [address] [phone]

## 2021-02-20 NOTE — ED Notes (Addendum)
ED Provider at bedside. Patient remains A/O, NSR on monitor.

## 2021-02-20 NOTE — ED Triage Notes (Signed)
Patient states he was seen in the a-fib clinic today for follow on a-fib. staets he has taken clartin-D for the past 2 days and he thinks that is what has caused him to go into a-fib. C/o sob denies pain.

## 2021-02-20 NOTE — ED Notes (Signed)
Patient calling daughter for ride home.

## 2021-02-20 NOTE — ED Provider Notes (Signed)
Patient signed to me by Dr. Billy Fischer.  Patient is not back to his baseline.  He remains in sinus rhythm.  Will discharge home   Lacretia Leigh, MD 02/20/21 1744

## 2021-02-21 LAB — ECHO TEE
AV Mean grad: 6 mmHg
AV Peak grad: 10.6 mmHg
Ao pk vel: 1.63 m/s

## 2021-02-22 ENCOUNTER — Other Ambulatory Visit (HOSPITAL_COMMUNITY): Payer: Self-pay | Admitting: Cardiology

## 2021-02-22 LAB — URINE CULTURE: Culture: NO GROWTH

## 2021-02-25 ENCOUNTER — Encounter (HOSPITAL_COMMUNITY): Payer: Self-pay | Admitting: Nurse Practitioner

## 2021-02-25 ENCOUNTER — Other Ambulatory Visit: Payer: Self-pay

## 2021-02-25 ENCOUNTER — Ambulatory Visit (HOSPITAL_COMMUNITY)
Admit: 2021-02-25 | Discharge: 2021-02-25 | Disposition: A | Discharge status: Home / Self Care | Payer: Medicare Other | Attending: Nurse Practitioner | Admitting: Nurse Practitioner

## 2021-02-25 VITALS — BP 112/70 | HR 56 | Ht 70.5 in | Wt 198.8 lb

## 2021-02-25 DIAGNOSIS — I480 Paroxysmal atrial fibrillation: Secondary | ICD-10-CM | POA: Insufficient documentation

## 2021-02-25 DIAGNOSIS — Z8249 Family history of ischemic heart disease and other diseases of the circulatory system: Secondary | ICD-10-CM | POA: Diagnosis not present

## 2021-02-25 DIAGNOSIS — I509 Heart failure, unspecified: Secondary | ICD-10-CM | POA: Diagnosis not present

## 2021-02-25 DIAGNOSIS — I4891 Unspecified atrial fibrillation: Secondary | ICD-10-CM | POA: Diagnosis not present

## 2021-02-25 DIAGNOSIS — Z951 Presence of aortocoronary bypass graft: Secondary | ICD-10-CM | POA: Diagnosis not present

## 2021-02-25 DIAGNOSIS — I25110 Atherosclerotic heart disease of native coronary artery with unstable angina pectoris: Secondary | ICD-10-CM | POA: Insufficient documentation

## 2021-02-25 DIAGNOSIS — D6869 Other thrombophilia: Secondary | ICD-10-CM | POA: Diagnosis not present

## 2021-02-25 DIAGNOSIS — I6529 Occlusion and stenosis of unspecified carotid artery: Secondary | ICD-10-CM | POA: Insufficient documentation

## 2021-02-25 DIAGNOSIS — I959 Hypotension, unspecified: Secondary | ICD-10-CM | POA: Insufficient documentation

## 2021-02-25 DIAGNOSIS — Z79899 Other long term (current) drug therapy: Secondary | ICD-10-CM | POA: Diagnosis not present

## 2021-02-25 DIAGNOSIS — Z87891 Personal history of nicotine dependence: Secondary | ICD-10-CM | POA: Insufficient documentation

## 2021-02-25 DIAGNOSIS — I739 Peripheral vascular disease, unspecified: Secondary | ICD-10-CM | POA: Insufficient documentation

## 2021-02-25 DIAGNOSIS — Z7901 Long term (current) use of anticoagulants: Secondary | ICD-10-CM | POA: Insufficient documentation

## 2021-02-25 DIAGNOSIS — I110 Hypertensive heart disease with heart failure: Secondary | ICD-10-CM | POA: Insufficient documentation

## 2021-02-25 DIAGNOSIS — Z7984 Long term (current) use of oral hypoglycemic drugs: Secondary | ICD-10-CM | POA: Insufficient documentation

## 2021-02-25 DIAGNOSIS — Z954 Presence of other heart-valve replacement: Secondary | ICD-10-CM | POA: Insufficient documentation

## 2021-02-25 MED ORDER — RAMIPRIL 2.5 MG PO CAPS: 2.5000 mg | ORAL_CAPSULE | Freq: Two times a day (BID) | ORAL | 3 refills | Status: DC

## 2021-02-25 NOTE — Patient Instructions (Signed)
Decrease ramipril to 2.5mg  twice a day

## 2021-02-25 NOTE — Progress Notes (Signed)
Primary Care Physician: Lorne Skeens, MD Referring Physician: Dr. Aundra Dubin EP: Dr. Zachery Conch Nathaniel [name]. is a 87 y.o. male with  extensive vascular disease including CAD s/p CABG and redo CABG, AAA s/p repair, PAD, carotid stenosis as  prior AVR , as well as h/o paroxysmal afib, that is in the afib clinic for consideration for Tikosyn admit. He was seen by Dr. Aundra Dubin last week  and found to have ERAF after successful TEE/DCCV  6/11. Pt does  have RVR and has not tolerated BB's  for extreme fatigue or amiodarone in the remote past for suicidal ideations. Dr. 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   F/u in afib clinic, 02/20/21. He has had a cardioversion in February and saw Dr. Curt Bears in f/u and was in [place]. On arrival to clinic  this am, he became dizzy and was slumping forward and the nurse caught him and put in a wheelchair. He was found to be in afib with v rates in the 120's and BP on presentation of 82/50. He also had a dizzy spell at home this am but reports that he has felt good for the last several days, so was surprised that he was back in afib. He has had an escalation of afib over the last several months which he does not tolerate, despite being on tikosyn. He is currently tolerating BB's which he has not been able to tolerate in the past.  However,  he cannot tolerate amiodarone. After sitting, v rates slowed but BP still at 98/60, no room to uptitrate BB. Dr.Camnitz did offer the pt an ablation on his last visit but the pt was undecided. He is on coumadin with a CHA2DS2VASc score of at least 6.  F/u ER, 02/25/21,  visit for urgent cardioversion. It was successful and remains in [place]. He feels  the several doses of [name] that he took the previous week was the trigger for afib. There was also a note from [facility], [place],  to consider  reducing  BP meds,  If BP was soft today, as he becomes hypotensive in afib. Will reduce ACE to 2.5 mg bid  for BP 112/70 today. He still is not interested in an ablation.    Today, he denies symptoms of palpitations, chest pain, shortness of breath, orthopnea, PND, lower extremity edema, dizziness, presyncope, syncope, or neurologic sequela. The patient is tolerating medications without difficulties and is otherwise without complaint today.   Past Medical History:  Diagnosis Date  . AAA (abdominal aortic aneurysm) ([place])   . Atrial fibrillation ([place])   . Atrial fibrillation ([place])   . CAD (coronary artery disease)   . Cataract   . CHF (congestive heart failure) ([place])   . Chronic kidney disease   . Colon polyps    adenomatous  . Diabetes mellitus   . Diverticulosis   . Dysrhythmia    A-Fib  . GERD (gastroesophageal reflux disease)   . Gout   . Hernia, abdominal   . History of aortic valve replacement with bioprosthetic valve    2007  . Hx: UTI (urinary tract infection)   . Hyperlipemia   . Hypertension   . Internal hemorrhoids   . Peripheral vascular disease ([place])   . Shortness of breath    with exertion  Past Surgical History:  Procedure Laterality Date  . ABDOMINAL AORTIC ENDOVASCULAR STENT GRAFT N/A 04/13/2014   Procedure: ABDOMINAL AORTIC ENDOVASCULAR STENT GRAFT;  Surgeon: Serafina Mitchell, MD;  Location: [place];  Service: Vascular;  Laterality: N/A;  . ANGIOPLASTY     Unsure if he had stents put in   . APPENDECTOMY    . CARDIAC VALVE SURGERY    . CARDIOVERSION N/A 06/06/2014   Procedure: CARDIOVERSION;  Surgeon: Larey Dresser, MD;  Location: [facility] ENDOSCOPY;  Service: Cardiovascular;  Laterality: N/A;  . CARDIOVERSION N/A 08/21/2014   Procedure: CARDIOVERSION;  Surgeon: Larey Dresser, MD;  Location: [place];  Service: Cardiovascular;  Laterality: N/A;  . CARDIOVERSION N/A 12/22/2017   Procedure: CARDIOVERSION;  Surgeon: Larey Dresser, MD;  Location: [facility] ENDOSCOPY;   Service: Cardiovascular;  Laterality: N/A;  . CARDIOVERSION N/A 05/25/2019   Procedure: CARDIOVERSION;  Surgeon: Larey Dresser, MD;  Location: [facility] ENDOSCOPY;  Service: Cardiovascular;  Laterality: N/A;  . CARDIOVERSION N/A 06/14/2019   Procedure: CARDIOVERSION;  Surgeon: Jerline Pain, MD;  Location: [facility] ENDOSCOPY;  Service: Cardiovascular;  Laterality: N/A;  . CARDIOVERSION N/A 12/12/2020   Procedure: CARDIOVERSION;  Surgeon: Larey Dresser, MD;  Location: [facility] ENDOSCOPY;  Service: Cardiovascular;  Laterality: N/A;  . CARDIOVERSION N/A 01/29/2021   Procedure: CARDIOVERSION;  Surgeon: Larey Dresser, MD;  Location: [facility] ENDOSCOPY;  Service: Cardiovascular;  Laterality: N/A;  . CORONARY ARTERY BYPASS GRAFT    . ENDARTERECTOMY Right 02/02/2018   Procedure: ENDARTERECTOMY CAROTID RIGHT;  Surgeon: Serafina Mitchell, MD;  Location: [place];  Service: Vascular;  Laterality: Right;  . EYE SURGERY Bilateral    cataract surgery  . HERNIA REPAIR     2 operations  . LEFT HEART CATHETERIZATION WITH CORONARY/GRAFT ANGIOGRAM N/A 07/10/2014   Procedure: LEFT HEART CATHETERIZATION WITH Beatrix Fetters;  Surgeon: Larey Dresser, MD;  Location: [facility] CATH LAB;  Service: Cardiovascular;  Laterality: N/A;  . TEE WITHOUT CARDIOVERSION N/A 05/25/2019   Procedure: TRANSESOPHAGEAL ECHOCARDIOGRAM (TEE);  Surgeon: Larey Dresser, MD;  Location: [facility] ENDOSCOPY;  Service: Cardiovascular;  Laterality: N/A;  . TEE WITHOUT CARDIOVERSION N/A 01/29/2021   Procedure: TRANSESOPHAGEAL ECHOCARDIOGRAM (TEE);  Surgeon: Larey Dresser, MD;  Location: [facility] ENDOSCOPY;  Service: Cardiovascular;  Laterality: N/A;  . TONSILLECTOMY    . VIDEO BRONCHOSCOPY Bilateral 12/25/2015   Procedure: VIDEO BRONCHOSCOPY WITHOUT FLUORO;  Surgeon: Javier Glazier, MD;  Location: [place];  Service: Cardiopulmonary;  Laterality: Bilateral;    Current Outpatient Medications  Medication Sig Dispense Refill  . allopurinol (ZYLOPRIM) 300 MG tablet  Take 300 mg by mouth daily.   2  . atorvastatin (LIPITOR) 10 MG tablet Take 10 mg by mouth every other day. Take 10 mg every other night for a total of 30 mg    . atorvastatin (LIPITOR) 20 MG tablet Take 20 mg by mouth daily.    . Coenzyme Q10 (CO Q10) 200 MG CAPS Take 200 mg by mouth daily.    Marland Kitchen dofetilide (TIKOSYN) 250 MCG capsule TAKE ONE CAPSULE TWICE A DAY 60 capsule 0  . doxazosin (CARDURA) 1 MG tablet Take 1 mg by mouth at bedtime.    Marland Kitchen doxazosin (CARDURA) 2 MG tablet Take 2 mg by mouth every morning.    . empagliflozin (JARDIANCE) 10 MG TABS tablet Take 10 mg by mouth daily.    . Evolocumab (REPATHA SURECLICK) 706 MG/ML SOAJ Inject 1 pen into the skin every 14 (fourteen) days. 2 mL 11  .  ezetimibe (ZETIA) 10 MG tablet Take 1 tablet (10 mg total) by mouth at bedtime. 30 tablet 3  . furosemide (LASIX) 20 MG tablet Take 20-40 mg by mouth as directed. Rotating every other day; 20mg  one day and 40mg  the next day    . latanoprost (XALATAN) 0.005 % ophthalmic solution Place 1 drop into both eyes daily.    . melatonin 3 MG TABS tablet Take 3 mg by mouth at bedtime. 30 min before bed    . metoprolol succinate (TOPROL XL) 25 MG 24 hr tablet Take 1 tablet (25 mg total) by mouth daily. (Patient taking differently: Take 25 mg by mouth 2 (two) times daily.) 60 tablet 11  . Misc Natural Products (GLUCOSAMINE CHONDROITIN TRIPLE) TABS Take 1 tablet by mouth daily.    Marland Kitchen spironolactone (ALDACTONE) 25 MG tablet TAKE ONE TABLET DAILY 30 tablet 3  . tamsulosin (FLOMAX) 0.4 MG CAPS capsule Take 0.4 mg by mouth 2 (two) times daily.    . timolol (TIMOPTIC) 0.5 % ophthalmic solution Place 1 drop into both eyes at bedtime.  5  . warfarin (COUMADIN) 3 MG tablet Take 2-3 tablets by mouth daily or as instructed by the coumadin clinic. 90 tablet 0  . ramipril (ALTACE) 2.5 MG capsule Take 1 capsule (2.5 mg total) by mouth 2 (two) times daily. 90 capsule 3   No current facility-administered medications for this  encounter.    Allergies  Allergen Reactions  . Grass Extracts [Gramineae Pollens] Other (See Comments)    Sneezing and runny nose  . Milk-Related Compounds Other (See Comments)    Just Yogurt.  Makes him very lethargic.  Marland Kitchen Amiodarone Anxiety    depression    Social History   Socioeconomic History  . Marital status: Widowed    Spouse name: Not on file  . Number of children: Not on file  . Years of education: Not on file  . Highest education level: Not on file  Occupational History  . Occupation: [employer]   Tobacco Use  . Smoking status: Former Smoker    Packs/day: 0.25    Years: 20.00    Pack years: 5.00    Types: Cigarettes    Quit date: 03/23/1992    Years since quitting: 28.9  . Smokeless tobacco: Never Used  Vaping Use  . Vaping Use: Never used  Substance and Sexual Activity  . Alcohol use: Yes    Alcohol/week: 10.0 standard drinks    Types: 10 Glasses of wine per week    Comment: 1-2 drinks daily   . Drug use: No  . Sexual activity: Not on file  Other Topics Concern  . Not on file  Social History Narrative   1 caffeine drink daily    Social Determinants of Health   Financial Resource Strain: Not on file  Food Insecurity: Not on file  Transportation Needs: Not on file  Physical Activity: Not on file  Stress: Not on file  Social Connections: Not on file  Intimate Partner Violence: Not on file    Family History  Problem Relation Age of Onset  . Heart disease Mother   . Heart disease Father        before age 83  . AAA (abdominal aortic aneurysm) Father   . Colon cancer Neg Hx     ROS- All systems are reviewed and negative except as per the HPI above  Physical Exam: Vitals:   02/25/21 1421  BP: 112/70  Pulse: (!) 56  Weight: 90.2  kg  Height: 5' 10.5" (1.791 m)   Wt Readings from Last 3 Encounters:  02/25/21 90.2 kg  02/20/21 88.9 kg  02/20/21 88.9 kg    Labs: Lab Results  Component Value Date   NA 135 02/20/2021   K 4.0  02/20/2021   CL 105 02/20/2021   CO2 23 02/20/2021   GLUCOSE 130 (H) 02/20/2021   BUN 21 02/20/2021   CREATININE 1.20 02/20/2021   CALCIUM 8.8 (L) 02/20/2021   PHOS 3.2 12/22/2017   MG 1.9 11/29/2020   Lab Results  Component Value Date   INR 2.3 (H) 02/20/2021   Lab Results  Component Value Date   CHOL 113 01/01/2021   HDL 49 01/01/2021   LDLCALC 45 01/01/2021   TRIG 96 01/01/2021     GEN- The patient is well appearing, alert and oriented x 3 today.   Head- normocephalic, atraumatic Eyes-  Sclera clear, conjunctiva pink Ears- hearing intact Oropharynx- clear Neck- supple, no JVP Lymph- no cervical lymphadenopathy Lungs- Clear to ausculation bilaterally, normal work of breathing Heart regular rate and rhythm, no murmurs, rubs or gallops, PMI not laterally displaced GI- soft, NT, ND, + BS Extremities- no clubbing, cyanosis, or edema MS- no significant deformity or atrophy Skin- no rash or lesion Psych- euthymic mood, full affect Neuro- strength and sensation are intact  EKG - Sinus brady at 56 bpm, with PAC's. Pr int 192 ms, qrs int 118 ms, qtc 465 ms    Assessment and Plan: 1. Paroxysmal afib with recent DCCV with ERAF with RVR Pt cannot take  amiodarone for intolerance, hallucinations and suicidal ideations (remote use in 2015)  He had successful CV in the ER 02/20/21 He is not interested in an ablation and feels his last breakthrough was due to use of decongestants, which he plans to avoid  Continue on warfarin for CHA2DS2VASc score of of at least 5.    2. Soft BP's with hypotension when in afib ER  recommendation was to reduce BP meds Will lower ramipril to 2.5 mg bid instead of 5 am/2.5 PM  F/u with Dr. Aundra Dubin 4/15 and Dr. Ileana Ladd 5/24  afib clinic as needed    Geroge Baseman. Satine Hausner, [facility] [address] [place], [place] [address] [phone]

## 2021-02-28 DIAGNOSIS — E1151 Type 2 diabetes mellitus with diabetic peripheral angiopathy without gangrene: Secondary | ICD-10-CM | POA: Diagnosis not present

## 2021-02-28 DIAGNOSIS — I129 Hypertensive chronic kidney disease with stage 1 through stage 4 chronic kidney disease, or unspecified chronic kidney disease: Secondary | ICD-10-CM | POA: Diagnosis not present

## 2021-02-28 DIAGNOSIS — E782 Mixed hyperlipidemia: Secondary | ICD-10-CM | POA: Diagnosis not present

## 2021-02-28 DIAGNOSIS — E1122 Type 2 diabetes mellitus with diabetic chronic kidney disease: Secondary | ICD-10-CM | POA: Diagnosis not present

## 2021-02-28 DIAGNOSIS — E1159 Type 2 diabetes mellitus with other circulatory complications: Secondary | ICD-10-CM | POA: Diagnosis not present

## 2021-02-28 DIAGNOSIS — I2510 Atherosclerotic heart disease of native coronary artery without angina pectoris: Secondary | ICD-10-CM | POA: Diagnosis not present

## 2021-02-28 DIAGNOSIS — N182 Chronic kidney disease, stage 2 (mild): Secondary | ICD-10-CM | POA: Diagnosis not present

## 2021-02-28 DIAGNOSIS — E1165 Type 2 diabetes mellitus with hyperglycemia: Secondary | ICD-10-CM | POA: Diagnosis not present

## 2021-02-28 DIAGNOSIS — M109 Gout, unspecified: Secondary | ICD-10-CM | POA: Diagnosis not present

## 2021-03-04 ENCOUNTER — Other Ambulatory Visit: Payer: Self-pay

## 2021-03-04 ENCOUNTER — Ambulatory Visit (HOSPITAL_COMMUNITY)
Admission: RE | Admit: 2021-03-04 | Discharge: 2021-03-04 | Disposition: A | Discharge status: Home / Self Care | Payer: Medicare Other | Source: Ambulatory Visit | Attending: Cardiology | Admitting: Cardiology

## 2021-03-04 ENCOUNTER — Ambulatory Visit (INDEPENDENT_AMBULATORY_CARE_PROVIDER_SITE_OTHER): Payer: Medicare Other | Admitting: *Deleted

## 2021-03-04 DIAGNOSIS — E785 Hyperlipidemia, unspecified: Secondary | ICD-10-CM | POA: Insufficient documentation

## 2021-03-04 DIAGNOSIS — Z5181 Encounter for therapeutic drug level monitoring: Secondary | ICD-10-CM | POA: Diagnosis not present

## 2021-03-04 DIAGNOSIS — I4891 Unspecified atrial fibrillation: Secondary | ICD-10-CM

## 2021-03-04 DIAGNOSIS — I5032 Chronic diastolic (congestive) heart failure: Secondary | ICD-10-CM

## 2021-03-04 LAB — LIPID PANEL
Cholesterol: 79 mg/dL (ref 0–200)
HDL: 49 mg/dL (ref >40)
LDL Cholesterol: 15 mg/dL (ref 0–99)
Total CHOL/HDL Ratio: 1.6 RATIO
Triglycerides: 75 mg/dL (ref <150)
VLDL: 15 mg/dL (ref 0–40)

## 2021-03-04 LAB — BASIC METABOLIC PANEL
Anion gap: 6 (ref 5–15)
BUN: 23 mg/dL (ref 8–23)
CO2: 29 mmol/L (ref 22–32)
Calcium: 9.2 mg/dL (ref 8.9–10.3)
Chloride: 103 mmol/L (ref 98–111)
Creatinine, Ser: 1.1 mg/dL (ref 0.61–1.24)
GFR, Estimated: >60 mL/min (ref >60)
Glucose, Bld: 117 mg/dL — ABNORMAL HIGH (ref 70–99)
Potassium: 4.4 mmol/L (ref 3.5–5.1)
Sodium: 138 mmol/L (ref 135–145)

## 2021-03-04 LAB — POCT INR: INR: 2.1 (ref 2.0–3.0)

## 2021-03-04 NOTE — Patient Instructions (Signed)
Description   -continue taking Warfarin 3 tablets daily except for 2 tablets on Mondays and Fridays.  - Recheck INR in 3 weeks.  Coumadin clinic [phone].

## 2021-03-08 ENCOUNTER — Other Ambulatory Visit (HOSPITAL_COMMUNITY): Payer: Self-pay | Admitting: Cardiology

## 2021-03-26 ENCOUNTER — Other Ambulatory Visit: Payer: Self-pay

## 2021-03-26 ENCOUNTER — Ambulatory Visit (INDEPENDENT_AMBULATORY_CARE_PROVIDER_SITE_OTHER): Payer: Medicare Other

## 2021-03-26 DIAGNOSIS — Z5181 Encounter for therapeutic drug level monitoring: Secondary | ICD-10-CM | POA: Diagnosis not present

## 2021-03-26 DIAGNOSIS — I4891 Unspecified atrial fibrillation: Secondary | ICD-10-CM

## 2021-03-26 LAB — POCT INR: INR: 2.7 (ref 2.0–3.0)

## 2021-03-26 NOTE — Patient Instructions (Signed)
Description   Continue on same dosage of Warfarin 3 tablets daily except for 2 tablets on Mondays and Fridays. Recheck INR in 4 weeks. Coumadin clinic [phone].

## 2021-03-28 ENCOUNTER — Ambulatory Visit (HOSPITAL_COMMUNITY)
Admission: RE | Admit: 2021-03-28 | Discharge: 2021-03-28 | Disposition: A | Discharge status: Home / Self Care | Payer: Medicare Other | Source: Ambulatory Visit | Attending: Cardiology | Admitting: Cardiology

## 2021-03-28 ENCOUNTER — Encounter (HOSPITAL_COMMUNITY): Payer: Self-pay | Admitting: Cardiology

## 2021-03-28 ENCOUNTER — Other Ambulatory Visit: Payer: Self-pay

## 2021-03-28 VITALS — BP 128/70 | HR 51 | Wt 198.4 lb

## 2021-03-28 DIAGNOSIS — E785 Hyperlipidemia, unspecified: Secondary | ICD-10-CM | POA: Insufficient documentation

## 2021-03-28 DIAGNOSIS — I5042 Chronic combined systolic (congestive) and diastolic (congestive) heart failure: Secondary | ICD-10-CM | POA: Insufficient documentation

## 2021-03-28 DIAGNOSIS — Z79899 Other long term (current) drug therapy: Secondary | ICD-10-CM | POA: Insufficient documentation

## 2021-03-28 DIAGNOSIS — I739 Peripheral vascular disease, unspecified: Secondary | ICD-10-CM | POA: Insufficient documentation

## 2021-03-28 DIAGNOSIS — Z953 Presence of xenogenic heart valve: Secondary | ICD-10-CM | POA: Diagnosis not present

## 2021-03-28 DIAGNOSIS — I6521 Occlusion and stenosis of right carotid artery: Secondary | ICD-10-CM | POA: Insufficient documentation

## 2021-03-28 DIAGNOSIS — Z7984 Long term (current) use of oral hypoglycemic drugs: Secondary | ICD-10-CM | POA: Insufficient documentation

## 2021-03-28 DIAGNOSIS — E1151 Type 2 diabetes mellitus with diabetic peripheral angiopathy without gangrene: Secondary | ICD-10-CM | POA: Diagnosis not present

## 2021-03-28 DIAGNOSIS — I2510 Atherosclerotic heart disease of native coronary artery without angina pectoris: Secondary | ICD-10-CM | POA: Insufficient documentation

## 2021-03-28 DIAGNOSIS — Z7901 Long term (current) use of anticoagulants: Secondary | ICD-10-CM | POA: Insufficient documentation

## 2021-03-28 DIAGNOSIS — Z8774 Personal history of (corrected) congenital malformations of heart and circulatory system: Secondary | ICD-10-CM | POA: Insufficient documentation

## 2021-03-28 DIAGNOSIS — I480 Paroxysmal atrial fibrillation: Secondary | ICD-10-CM

## 2021-03-28 DIAGNOSIS — Z951 Presence of aortocoronary bypass graft: Secondary | ICD-10-CM | POA: Diagnosis not present

## 2021-03-28 DIAGNOSIS — I714 Abdominal aortic aneurysm, without rupture: Secondary | ICD-10-CM | POA: Diagnosis not present

## 2021-03-28 DIAGNOSIS — I110 Hypertensive heart disease with heart failure: Secondary | ICD-10-CM | POA: Diagnosis not present

## 2021-03-28 DIAGNOSIS — I5022 Chronic systolic (congestive) heart failure: Secondary | ICD-10-CM | POA: Diagnosis not present

## 2021-03-28 DIAGNOSIS — G4719 Other hypersomnia: Secondary | ICD-10-CM | POA: Diagnosis not present

## 2021-03-28 DIAGNOSIS — G4733 Obstructive sleep apnea (adult) (pediatric): Secondary | ICD-10-CM | POA: Diagnosis not present

## 2021-03-28 LAB — BASIC METABOLIC PANEL
Anion gap: 6 (ref 5–15)
BUN: 23 mg/dL (ref 8–23)
CO2: 27 mmol/L (ref 22–32)
Calcium: 9 mg/dL (ref 8.9–10.3)
Chloride: 104 mmol/L (ref 98–111)
Creatinine, Ser: 1.18 mg/dL (ref 0.61–1.24)
GFR, Estimated: >60 mL/min (ref >60)
Glucose, Bld: 113 mg/dL — ABNORMAL HIGH (ref 70–99)
Potassium: 4.4 mmol/L (ref 3.5–5.1)
Sodium: 137 mmol/L (ref 135–145)

## 2021-03-28 LAB — CBC
HCT: 48.2 % (ref 39.0–52.0)
Hemoglobin: 15.4 g/dL (ref 13.0–17.0)
MCH: 29.6 pg (ref 26.0–34.0)
MCHC: 32 g/dL (ref 30.0–36.0)
MCV: 92.7 fL (ref 80.0–100.0)
Platelets: 173 10*3/uL (ref 150–400)
RBC: 5.2 MIL/uL (ref 4.22–5.81)
RDW: 13.6 % (ref 11.5–15.5)
WBC: 7.2 10*3/uL (ref 4.0–10.5)
nRBC: 0 % (ref 0.0–0.2)

## 2021-03-28 LAB — MAGNESIUM: Magnesium: 2.2 mg/dL (ref 1.7–2.4)

## 2021-03-28 NOTE — Progress Notes (Signed)
Height: 5'10"    Weight: 198 lb BMI: 28.07  Today's Date: 03/28/2021  STOP BANG RISK ASSESSMENT S (snore) Have you been told that you snore?     YES   T (tired) Are you often tired, fatigued, or sleepy during the day?   YES  O (obstruction) Do you stop breathing, choke, or gasp during sleep? NO   P (pressure) Do you have or are you being treated for high blood pressure? YES   B (BMI) Is your body index greater than 35 kg/m? NO   A (age) Are you 85 years old or older? YES   N (neck) Do you have a neck circumference greater than 16 inches?      G (gender) Are you a male? YES   TOTAL STOP/BANG "YES" ANSWERS 5                                                                       For Office Use Only              Procedure Order Form    YES to 3+ Stop Bang questions OR two clinical symptoms - patient qualifies for WatchPAT (CPT 95800)      Clinical Notes: Will consult Sleep Specialist and refer for management of therapy due to patient increased risk of Sleep Apnea. Ordering a sleep study due to the following two clinical symptoms: Excessive daytime sleepiness G47.10 / Loud snoring R06.83

## 2021-03-28 NOTE — Patient Instructions (Signed)
Stop Doxazosin  Please follow up with your urologist regarding this  Labs done today, we will call you for abnormal results  Your physician has recommended that you have a sleep study. This test records several body functions during sleep, including: brain activity, eye movement, oxygen and carbon dioxide blood levels, heart rate and rhythm, breathing rate and rhythm, the flow of air through your mouth and nose, snoring, body muscle movements, and chest and belly movement.  Your physician recommends that you schedule a follow-up appointment in: 3 months  If you have any questions or concerns before your next appointment please send [place] a message through [place] or call our office at [phone].    TO LEAVE A MESSAGE FOR THE NURSE SELECT OPTION 2, PLEASE LEAVE A MESSAGE INCLUDING: . YOUR NAME . DATE OF BIRTH . CALL BACK NUMBER . REASON FOR CALL**this is important as we prioritize the call backs  [place] AS LONG AS YOU CALL BEFORE 4:00 PM  At the [facility], you and your health needs are our priority. As part of our continuing mission to provide you with exceptional heart care, we have created designated Provider Care Teams. These Care Teams include your primary Cardiologist (physician) and Advanced Practice Providers (APPs- Physician Assistants and Nurse Practitioners) who all work together to provide you with the care you need, when you need it.   You may see any of the following providers on your designated Care Team at your next follow up: Marland Kitchen Dr Glori Bickers . Dr [name] . Dr Vickki Muff . Darrick Grinder, NP . Lyda Jester, [place] . Audry Riles, PharmD   Please be sure to bring in all your medications bottles to every appointment.

## 2021-03-30 NOTE — Progress Notes (Signed)
Patient ID: Mathew Postiglione., male   DOB: 13-Apr-1932, 87 y.o.   MRN: [id number] PCP: Dr. Elyse Hsu Cardiology: Dr. Aundra Dubin  87 y.o. with history of extensive vascular disease including CAD s/p CABG and redo CABG, AAA s/p repair, PAD, and carotid stenosis as well as prior AVR presents for followup of CAD and atrial fibrillation.   He has had documentation of significant peripheral arterial disease.  He gets bilateral calf soreness after walking for about 15 minutes.  This resolves with rest, no rest pain.  No pedal ulcers, though when he gets cuts on his feet, they heal slowly.  Peripheral arterial dopplers done in 10/14 showed > 50% focal SFA stenoses bilaterally.  He has carotid artery disease.  No stroke-like symptoms.  He had a endovascular AAA repair in 5/15.    After his AAA repair, he was noted to be in atrial fibrillation.  He had had a prior episode of atrial fibrillation after his cardiac surgery in 2007.  Atrial fibrillation was rate-controlled.  He was started on warfarin and Toprol XL.  He developed exertional dyspnea and fatigue in atrial fibrillation. I cardioverted him back to NSR on 06/06/14.  I also had him get an echocardiogram in 6/15.  This showed EF worsened to 25-30% with stable bioprosthetic aortic valve, mildly dilated RV with normal systolic function.  Given the fall in EF, I took him for [facility] in 7/15.  This showed stable anatomy.  The LIMA-LAD was patent, the sequential SVG-OM/ramus/D was patent only to ramus but this is the same as the prior cath, and the SVG-PDA was patent with a long 50-60% mid-graft stenosis.  No intervention.  Of note, at cath he was noted to be back in atrial fibrillation.  I then started him on amiodarone.  I took Mr Pounders for [place] again on 08/21/14.  This was successful.  He has been off amiodarone due to side effects (hallucinations).  Echo in 1/16 showed recovery of EF to 55-60%.    He was admitted in 1/19 with bronchitis/wheezing/dyspnea and was found to be in  atrial fibrillation with RVR.  He tested positive for metapneumovirus.  Echo showed EF 45-50% with normal bioprosthetic aortic valve. He was diuresed for volume overload while in the hospital. He had DCCV to NSR.    Carotid dopplers in 1/19 showed 80-99% RICA stenosis, he had right CEA in 3/81 with no complications.   At appointment in early 6/20, Mr Geske was noted to be back in atrial fibrillation with RVR and mild CHF.  He was started on Toprol XL and Lasix was increased.  He had TEE-guided DCCV to NSR in 6/20.  TEE showed EF 50%, mild diffuse hypokinesis, mildly decreased RV systolic function, normally functioning bioprosthetic aortic valve.   In 2021, he was admitted for Tikosyn initiation.  In 12/21, he went into atrial fibrillation again and was cardioverted back to NSR.   Echo in 1/22 showed EF 45-50%, diffuse hypokinesis, mildly decreased RV systolic function, moderate biatrial enlargement, bioprosthetic valve with mean gradient 8 mmHg.    In 2/22, he went into atrial fibrillation and had TEE-guided DCCV back to NSR.  TEE showed EF 45-50%.    In 3/22, he was back in the ER with atrial fibrillation triggered by [name] most likely.  He had a cardioversion back to NSR.   He presents today for followup of CHF and atrial fibrillation.  He is in NSR today.  No palpitations since his last cardioversion.  Breathing is better  when in NSR.  No dyspnea walking on flat ground.  Gets short of breath walking up hills.  No chest pain.  No lightheadedness.  No orthopnea/PND.    ECG: NSR, iLBBB, QTc 453 msec (personally reviewed)  Labs (5/15): K 4.3, creatinine 0.89 Labs (6/15): K 4.3, creatinine 1.4, BNP 112 Labs (7/15): K 4.4, creatinine 1.2, HCT 40.2 Labs (8/15): K 4, creatinine 1.2, BNP 113, LFTs and TSH normal Labs (10/15): K 4.4, creatinine 1.4 Labs (5/16): K 4.7, creatinine 1.18, HDL 52, LDL 98 Labs (6/16): LDL 118, LFTs normal Labs (1/17): HCT 40.2, K 3.7, creatinine 1.04 Labs (1/19): K  4.9, creatinine 1.19 Labs (2/19): K 4.2, creatinine 0.89 Labs (3/19): LDL 86, HDL 45, TGs 245 Labs (5/19): LDL 110, HDL 39 Labs (6/19): K 4.1, creatinine 1.07 Labs (11/19): LDL 14, HDL 56 Labs (6/20): K 4.1, creatinine 1.0 Labs (7/20): K 4.3, creatinine 1.07 Labs (9/21): LDL 129, hgb 14.8, K 4.1, creatinine 1.02 Labs (12/21): LDL 20, HDL 46, K 4.4, creatinine 1.23 Labs (1/22): LDL 45, HDL 49, K 4.3, creatinine 1.1, BNP 184 Labs (3/22): K 4.4, creatinine 1.10, LDL 15, HDL 49  PMH: 1. PAD: Peripheral arterial dopplers in 2012 showed > 75% bilateral SFA stenosis. Peripheral arterial dopplers in 10/14 showed > 50% focal bilateral SFA stenoses.  2. AAA: [place] 4/15 with 4.4 cm AAA but concern for penetrating ulcers. CTA abdomen showed 4.4 cm AAA with penetrating ulcers and possible pseudoaneurysms. Now s/p endovascular repair of AAA in 9/32 with no complications.  3. Carotid stenosis: Carotid dopplers (4/15) with 60-79% bilateral ICA stenosis. Carotid dopplers (11/15) with 60-79% bilateral ICA stenosis.  Carotid dopplers 6/16 with 60-79% bilateral ICA stenosis. - Carotid dopplers (1/18): 60-79% RICA stenosis.  - Carotid dopplers (1/19): 80-99% RICA stenosis => Right CEA in 2/19.  - Carotid dopplers (11/19): 40-59% bilateral stenosis.  - Carotid dopplers (1/21): 1-39% BICA stenosis.  4. CAD: CABG 1989 with LIMA-LAD, SVG-D, seq SVG-ramus and OM, sequential SVG-PDA/PLV. SVG-PDA/PLV found to be occluded on cath prior to AVR, so patient had SVG-PDA with AVR in 2007. LHC (7/15) with LIMA-LAD patent with 40-50% stenosis in LAD after touchdown, sequential SVG-ramus/OM/diagonal with only the ramus branch still intact (known from prior cath), SVG-PDA from original surgery TO, SVG-PDA from redo surgery with long 50-60% mid-graft stenosis.  No target for intervention.  5. Severe aortic stenosis: Bioprosthetic AVR in 2007.  6. Atrial fibrillation: Paroxysmal. Initially noted after cardiac surgery, then again after  AAA repair.  DCCV to NSR 06/06/14.  DCCV to NSR again 08/21/14.  - DCCV to NSR in 1/19.  - DCCV to NSR in 6/20.  - Now on Tikosyn.  - DCCV to NSR 12/21.  - DCCV to NSR 2/22 - DCCV to NSR 3/22 7. Type II diabetes  8. Gout  9. GERD  10. Hyperlipidemia  11. HTN 12. GERD  13. Chronic systolic CHF: Echo (3/55) with EF worsened to 25-30% with grade II diastolic dysfunction, stable bioprosthetic aortic valve, mildly dilated RV with normal systolic function.  Echo (1/16) with EF 55-60%, basal inferior hypokinesis, mild LVH, bioprosthetic aortic valve with mean gradient 13 mmHg, mild MR, severe LAE.  - Echo (1/19): EF 45-50%, mild MR, normal bioprosthetic aortic valve.  - TEE (6/20): EF 50% with mild diffuse hypokinesis, mildly decreased RV systolic function with mild RV enlargement, bioprosthetic aortic valve looked ok, mild MR. - Echo (1/22):  EF 45-50%, diffuse hypokinesis, mildly decreased RV systolic function, moderate biatrial enlargement, bioprosthetic valve with  mean gradient 8 mmHg.   - TEE (2/22): EF 45-50%, RV normal, moderate biatrial enlargement, bioprosthetic AoV with mean gradient 6 mmHg, mild MR.  14. BPH  15. Lung nodules: Followed by pulmonary.   SH: Widower, 2 children, raised his 3 grand-daughters, lives in [place], [place].  Occasional ETOH, no smoking.   FH: Father died from ruptured AAA.   ROS: All systems reviewed and negative except as per HPI.   Current Outpatient Medications  Medication Sig Dispense Refill  . allopurinol (ZYLOPRIM) 300 MG tablet Take 300 mg by mouth daily.   2  . atorvastatin (LIPITOR) 10 MG tablet Take 10 mg by mouth every other day. Take 10 mg every other night for a total of 30 mg    . atorvastatin (LIPITOR) 20 MG tablet Take 20 mg by mouth daily.    . Coenzyme Q10 (CO Q10) 200 MG CAPS Take 200 mg by mouth daily.    Marland Kitchen dofetilide (TIKOSYN) 250 MCG capsule TAKE ONE CAPSULE TWICE A DAY 60 capsule 0  . empagliflozin (JARDIANCE) 10 MG TABS tablet  Take 10 mg by mouth daily.    . Evolocumab (REPATHA SURECLICK) 355 MG/ML SOAJ Inject 1 pen into the skin every 14 (fourteen) days. 2 mL 11  . ezetimibe (ZETIA) 10 MG tablet Take 1 tablet (10 mg total) by mouth at bedtime. 30 tablet 3  . furosemide (LASIX) 20 MG tablet Take 20-40 mg by mouth as directed. Rotating every other day; 20mg  one day and 40mg  the next day    . latanoprost (XALATAN) 0.005 % ophthalmic solution Place 1 drop into both eyes daily.    . melatonin 3 MG TABS tablet Take 3 mg by mouth at bedtime. 30 min before bed    . Metoprolol Succinate 25 MG CS24 Take 25 mg by mouth 2 (two) times daily.    . Misc Natural Products (GLUCOSAMINE CHONDROITIN TRIPLE) TABS Take 1 tablet by mouth daily.    . ramipril (ALTACE) 2.5 MG capsule Take 1 capsule (2.5 mg total) by mouth 2 (two) times daily. 90 capsule 3  . spironolactone (ALDACTONE) 25 MG tablet TAKE ONE TABLET DAILY 30 tablet 3  . tamsulosin (FLOMAX) 0.4 MG CAPS capsule Take 0.4 mg by mouth 2 (two) times daily.    . timolol (TIMOPTIC) 0.5 % ophthalmic solution Place 1 drop into both eyes at bedtime.  5  . warfarin (COUMADIN) 3 MG tablet TAKE 3 TABLETS DAILY EXCEPT 2 TABLETS ON MONDAY AND FRIDAY OR AS DIRECTED BY CLINIC 90 tablet 2   No current facility-administered medications for this encounter.    BP 128/70   Pulse (!) 51   Wt 90 kg (198 lb 6.4 oz)   SpO2 97%   BMI 28.07 kg/m    Wt Readings from Last 3 Encounters:  03/28/21 90 kg (198 lb 6.4 oz)  02/25/21 90.2 kg (198 lb 12.8 oz)  02/20/21 88.9 kg (196 lb)   PHYSICAL EXAM: General: NAD Neck: No JVD, no thyromegaly or thyroid nodule.  Lungs: Clear to auscultation bilaterally with normal respiratory effort. CV: Nondisplaced PMI.  Heart regular S1/S2, no S3/S4, no murmur.  1+ ankle edema.  No carotid bruit.  Normal pedal pulses.  Abdomen: Soft, nontender, no hepatosplenomegaly, no distention.  Skin: Intact without lesions or rashes.  Neurologic: Alert and oriented x 3.   Psych: Normal affect. Extremities: No clubbing or cyanosis.  HEENT: Normal.   Assessment/Plan:  1. CAD: s/p CABG and redo CABG.  Given fall in EF,  I did a cardiac cath in 7/15 as documented above. Unfortunately, despite significant coronary disease, there were no good interventional targets.  No recent chest pain.  - Continue atorvastatin, Zetia, and Repatha.  Good lipids in 12/21.  - He is on warfarin in setting of stable CAD so does not need ASA 81.   2. Hyperlipidemia: He is taking atorvastatin + Zetia and Repatha.  - Good lipids 12/21.  3. PAD: Patient denies claudication or foot ulcers. Peripheral arterial dopplers showed > 50% bilateral focal SFA stenoses in the past.  He is not a cilostazol candidate with CHF. He is followed by Dr. Trula Slade at [facility], had recent appt. No current claudication symptoms.  4. Carotid stenosis: S/p right CEA in 2/19.  Followed by Dr. Trula Slade at [facility]. Carotid Dopplers completed 11/21-->bilateral ICA without significant stenosis but >50% common carotid stenosis on left. - He had followup recently at [facility].  5. AAA: Successful endovascular repair in 5/15.  Followed by Dr. Trula Slade at [facility], recent appt.  6. Bioprosthetic AVR: Valve stable on 2/22 TEE.  7. Atrial fibrillation: Possible CNS side effects with amiodarone.  Given worsening CHF with atrial fibrillation and difficulty with rate control, I think that we need to maintain NSR.  He is on Tikosyn. Several breakthrough episodes in the last 6 months.  He saw EP and was offered ablation but decided against it.  - Continue Tikosyn, I do not think he has other anti-arrhythmic options (did not tolerate amiodarone).  QTc ok on ECG today.  - Continue warfarin. CBC today.  - Continue Toprol XL 25 mg bid.  8. Chronic primarily diastolic CHF: Suspect ischemic cardiomyopathy.  EF 25-30% in 6/15 but had normalized to 55-60% by 1/16.  Down to 45-50% on 1/19 echo but was in the setting of atrial fibrillation.  TEE in 6/20 showed EF  50%. TEE in 2/22 showed EF 45-50%.  NYHA class II-III symptoms, worse in atrial fibrillation.  He is not significantly volume overloaded today and weight is down.  He tolerates AF poorly, with worsening of HF symptoms.   - Continue Lasix 40 daily alternating with 20 mg daily.     - Continue ramipril 2.5 mg bid.   - Continue spironolactone 25 mg daily.  - Continue Jardiance 10 mg daily.  - Continue Toprol XL 25 mg bid.  9. HTN: BP generally controlled.    10. Diabetes: He is on Jardiance.  No GU symptoms. 11. OSA: Daytime sleepiness and fatigue - I will arrange for home sleep study.   Followup in 3 months.   [name] 03/30/2021

## 2021-04-02 DIAGNOSIS — H401122 Primary open-angle glaucoma, left eye, moderate stage: Secondary | ICD-10-CM | POA: Diagnosis not present

## 2021-04-02 DIAGNOSIS — H35373 Puckering of macula, bilateral: Secondary | ICD-10-CM | POA: Diagnosis not present

## 2021-04-02 DIAGNOSIS — Z961 Presence of intraocular lens: Secondary | ICD-10-CM | POA: Diagnosis not present

## 2021-04-11 ENCOUNTER — Other Ambulatory Visit (HOSPITAL_COMMUNITY): Payer: Self-pay | Admitting: Cardiology

## 2021-04-23 ENCOUNTER — Ambulatory Visit (INDEPENDENT_AMBULATORY_CARE_PROVIDER_SITE_OTHER): Payer: Medicare Other

## 2021-04-23 ENCOUNTER — Other Ambulatory Visit: Payer: Self-pay

## 2021-04-23 DIAGNOSIS — Z5181 Encounter for therapeutic drug level monitoring: Secondary | ICD-10-CM

## 2021-04-23 DIAGNOSIS — I4891 Unspecified atrial fibrillation: Secondary | ICD-10-CM

## 2021-04-23 LAB — POCT INR: INR: 3.4 — AB (ref 2.0–3.0)

## 2021-04-23 NOTE — Patient Instructions (Signed)
Description   Skip today's dosage of Warfarin, then resume same dosage of Warfarin 3 tablets daily except for 2 tablets on Mondays and Fridays. Recheck INR in 3 weeks. Coumadin clinic [phone].

## 2021-05-06 ENCOUNTER — Ambulatory Visit (INDEPENDENT_AMBULATORY_CARE_PROVIDER_SITE_OTHER): Payer: Medicare Other | Admitting: Cardiology

## 2021-05-06 ENCOUNTER — Other Ambulatory Visit: Payer: Self-pay

## 2021-05-06 ENCOUNTER — Encounter: Payer: Self-pay | Admitting: Cardiology

## 2021-05-06 VITALS — BP 100/62 | HR 73 | Ht 70.5 in | Wt 194.0 lb

## 2021-05-06 DIAGNOSIS — I4819 Other persistent atrial fibrillation: Secondary | ICD-10-CM

## 2021-05-06 DIAGNOSIS — I6523 Occlusion and stenosis of bilateral carotid arteries: Secondary | ICD-10-CM

## 2021-05-06 NOTE — Progress Notes (Signed)
Electrophysiology Office Note   Date:  05/06/2021   ID:  [name] Sani Madariaga., DOB 19-Sep-1932, MRN [id number]  PCP:  Lorne Skeens, MD  Cardiologist:  Aundra Dubin Primary Electrophysiologist:  Milo Solana Meredith Leeds, MD    Chief Complaint: AF   History of Present Illness: Nathaniel [name]. is a 87 y.o. male who is being seen today for the evaluation of AF at the request of Altheimer, [name], MD. Presenting today for electrophysiology evaluation.  He has a history significant for coronary artery disease status post CABG with redo CABG, AAA status postrepair, PAD, carotid stenosis status post right CEA.  He also has had a prior AVR.  He had endovascular AAA repair in 2015.  After his repair he was noted to be in atrial fibrillation.  He had an episode of atrial fibrillation after cardiac surgery in 2007.  His atrial fibrillation was rate controlled.  He was cardioverted in 2015.  His ejection fraction was found to be 25 to 30% with a stable bioprosthetic aortic valve and a mildly dilated RV.  He was started on amiodarone with a repeat cardioversion.  Unfortunately developed side effects of amiodarone including hallucinations.  He was admitted in 2021 and loaded on dofetilide.  He was cardioverted back to sinus rhythm.  He has a history significant for coronary artery disease status post CABG with redo CABG, AAA status post repair, PAD, and carotid stenosis status post right CEA.  He also has a prior AVR.  He presents today for work-up of his atrial fibrillation.    Today, denies symptoms of palpitations, chest pain, shortness of breath, orthopnea, PND, lower extremity edema, claudication, dizziness, presyncope, syncope, bleeding, or neurologic sequela. The patient is tolerating medications without difficulties.  He is in sinus rhythm today.  He had an episode of atrial fibrillation in March where he felt quite weak and fatigued.  His blood pressure was also low.  He was sent to the emergency room and  was cardioverted.  He has not had any atrial fibrillation since that time.  He was taking [name], which might have been a trigger.   Past Medical History:  Diagnosis Date  . AAA (abdominal aortic aneurysm) ([place])   . Atrial fibrillation ([place])   . Atrial fibrillation ([place])   . CAD (coronary artery disease)   . Cataract   . CHF (congestive heart failure) ([place])   . Chronic kidney disease   . Colon polyps    adenomatous  . Diabetes mellitus   . Diverticulosis   . Dysrhythmia    A-Fib  . GERD (gastroesophageal reflux disease)   . Gout   . Hernia, abdominal   . History of aortic valve replacement with bioprosthetic valve    2007  . Hx: UTI (urinary tract infection)   . Hyperlipemia   . Hypertension   . Internal hemorrhoids   . Peripheral vascular disease ([place])   . Shortness of breath    with exertion   Past Surgical History:  Procedure Laterality Date  . ABDOMINAL AORTIC ENDOVASCULAR STENT GRAFT N/A 04/13/2014   Procedure: ABDOMINAL AORTIC ENDOVASCULAR STENT GRAFT;  Surgeon: Serafina Mitchell, MD;  Location: [facility];  Service: Vascular;  Laterality: N/A;  . ANGIOPLASTY     Unsure if he had stents put in   . APPENDECTOMY    . CARDIAC VALVE SURGERY    . CARDIOVERSION N/A 06/06/2014   Procedure: CARDIOVERSION;  Surgeon: Larey Dresser, MD;  Location: [place];  Service: Cardiovascular;  Laterality: N/A;  . CARDIOVERSION N/A 08/21/2014   Procedure: CARDIOVERSION;  Surgeon: Larey Dresser, MD;  Location: [place];  Service: Cardiovascular;  Laterality: N/A;  . CARDIOVERSION N/A 12/22/2017   Procedure: CARDIOVERSION;  Surgeon: Larey Dresser, MD;  Location: [facility] ENDOSCOPY;  Service: Cardiovascular;  Laterality: N/A;  . CARDIOVERSION N/A 05/25/2019   Procedure: CARDIOVERSION;  Surgeon: Larey Dresser, MD;  Location: [place];  Service: Cardiovascular;  Laterality: N/A;  . CARDIOVERSION N/A 06/14/2019   Procedure: CARDIOVERSION;  Surgeon: Jerline Pain, MD;  Location: [facility]  ENDOSCOPY;  Service: Cardiovascular;  Laterality: N/A;  . CARDIOVERSION N/A 12/12/2020   Procedure: CARDIOVERSION;  Surgeon: Larey Dresser, MD;  Location: [facility] ENDOSCOPY;  Service: Cardiovascular;  Laterality: N/A;  . CARDIOVERSION N/A 01/29/2021   Procedure: CARDIOVERSION;  Surgeon: Larey Dresser, MD;  Location: [facility] ENDOSCOPY;  Service: Cardiovascular;  Laterality: N/A;  . CORONARY ARTERY BYPASS GRAFT    . ENDARTERECTOMY Right 02/02/2018   Procedure: ENDARTERECTOMY CAROTID RIGHT;  Surgeon: Serafina Mitchell, MD;  Location: [place];  Service: Vascular;  Laterality: Right;  . EYE SURGERY Bilateral    cataract surgery  . HERNIA REPAIR     2 operations  . LEFT HEART CATHETERIZATION WITH CORONARY/GRAFT ANGIOGRAM N/A 07/10/2014   Procedure: LEFT HEART CATHETERIZATION WITH Beatrix Fetters;  Surgeon: Larey Dresser, MD;  Location: [facility] CATH LAB;  Service: Cardiovascular;  Laterality: N/A;  . TEE WITHOUT CARDIOVERSION N/A 05/25/2019   Procedure: TRANSESOPHAGEAL ECHOCARDIOGRAM (TEE);  Surgeon: Larey Dresser, MD;  Location: [facility] ENDOSCOPY;  Service: Cardiovascular;  Laterality: N/A;  . TEE WITHOUT CARDIOVERSION N/A 01/29/2021   Procedure: TRANSESOPHAGEAL ECHOCARDIOGRAM (TEE);  Surgeon: Larey Dresser, MD;  Location: [facility] ENDOSCOPY;  Service: Cardiovascular;  Laterality: N/A;  . TONSILLECTOMY    . VIDEO BRONCHOSCOPY Bilateral 12/25/2015   Procedure: VIDEO BRONCHOSCOPY WITHOUT FLUORO;  Surgeon: Javier Glazier, MD;  Location: [place];  Service: Cardiopulmonary;  Laterality: Bilateral;     Current Outpatient Medications  Medication Sig Dispense Refill  . allopurinol (ZYLOPRIM) 300 MG tablet Take 300 mg by mouth daily.   2  . atorvastatin (LIPITOR) 10 MG tablet Take 10 mg by mouth every other day. Take 10 mg every other night for a total of 30 mg    . atorvastatin (LIPITOR) 20 MG tablet Take 20 mg by mouth daily.    . Coenzyme Q10 (CO Q10) 200 MG CAPS Take 200 mg by mouth daily.    Marland Kitchen  dofetilide (TIKOSYN) 250 MCG capsule TAKE ONE CAPSULE TWICE A DAY 60 capsule 11  . empagliflozin (JARDIANCE) 10 MG TABS tablet Take 10 mg by mouth daily.    . Evolocumab (REPATHA SURECLICK) 419 MG/ML SOAJ Inject 1 pen into the skin every 14 (fourteen) days. 2 mL 11  . ezetimibe (ZETIA) 10 MG tablet Take 1 tablet (10 mg total) by mouth at bedtime. 30 tablet 3  . furosemide (LASIX) 20 MG tablet Take 20-40 mg by mouth as directed. Rotating every other day; 20mg  one day and 40mg  the next day    . latanoprost (XALATAN) 0.005 % ophthalmic solution Place 1 drop into both eyes daily.    . melatonin 3 MG TABS tablet Take 3 mg by mouth at bedtime. 30 min before bed    . Metoprolol Succinate 25 MG CS24 Take 25 mg by mouth 2 (two) times daily.    . Misc Natural Products (GLUCOSAMINE CHONDROITIN TRIPLE) TABS Take 1 tablet by mouth daily.    Marland Kitchen  ramipril (ALTACE) 2.5 MG capsule Take 1 capsule (2.5 mg total) by mouth 2 (two) times daily. 90 capsule 3  . spironolactone (ALDACTONE) 25 MG tablet TAKE ONE TABLET DAILY 30 tablet 3  . tamsulosin (FLOMAX) 0.4 MG CAPS capsule Take 0.4 mg by mouth 2 (two) times daily.    . timolol (TIMOPTIC) 0.5 % ophthalmic solution Place 1 drop into both eyes at bedtime.  5  . warfarin (COUMADIN) 3 MG tablet TAKE 3 TABLETS DAILY EXCEPT 2 TABLETS ON MONDAY AND FRIDAY OR AS DIRECTED BY CLINIC 90 tablet 2   No current facility-administered medications for this visit.    Allergies:   Grass extracts [gramineae pollens], Milk-related compounds, and Amiodarone   Social History:  The patient  reports that he quit smoking about 29 years ago. His smoking use included cigarettes. He has a 5.00 pack-year smoking history. He has never used smokeless tobacco. He reports current alcohol use of about 10.0 standard drinks of alcohol per week. He reports that he does not use drugs.   Family History:  The patient's family history includes AAA (abdominal aortic aneurysm) in his father; Heart disease in  his father and mother.   ROS:  Please see the history of present illness.   Otherwise, review of systems is positive for none.   All other systems are reviewed and negative.   PHYSICAL EXAM: VS:  BP 100/62   Pulse 73   Ht 5' 10.5" (1.791 m)   Wt 194 lb (88 kg)   BMI 27.44 kg/m  , BMI Body mass index is 27.44 kg/m. GEN: Well nourished, well developed, in no acute distress  HEENT: normal  Neck: no JVD, carotid bruits, or masses Cardiac: RRR; no murmurs, rubs, or gallops,no edema  Respiratory:  clear to auscultation bilaterally, normal work of breathing GI: soft, nontender, nondistended, + BS MS: no deformity or atrophy  Skin: warm and dry Neuro:  Strength and sensation are intact Psych: euthymic mood, full affect  EKG:  EKG is ordered today. Personal review of the ekg ordered shows sinus rhythm, rate 73  Recent Labs: 02/20/2021: ALT 22; B Natriuretic Peptide 217.9 03/28/2021: BUN 23; Creatinine, Ser 1.18; Hemoglobin 15.4; Magnesium 2.2; Platelets 173; Potassium 4.4; Sodium 137    Lipid Panel     Component Value Date/Time   CHOL 79 03/04/2021 0857   CHOL 96 (L) 11/09/2018 0913   TRIG 75 03/04/2021 0857   TRIG 113 05/08/2009 0000   HDL 49 03/04/2021 0857   HDL 56 11/09/2018 0913   CHOLHDL 1.6 03/04/2021 0857   VLDL 15 03/04/2021 0857   LDLCALC 15 03/04/2021 0857   LDLCALC 14 11/09/2018 0913   LDLDIRECT 98 05/08/2009 0000     Wt Readings from Last 3 Encounters:  05/06/21 194 lb (88 kg)  03/28/21 198 lb 6.4 oz (90 kg)  02/25/21 198 lb 12.8 oz (90.2 kg)      Other studies Reviewed: Additional studies/ records that were reviewed today include: TTE 01/01/21  Review of the above records today demonstrates:  1. Left ventricular ejection fraction, by estimation, is 45 to 50%. The  left ventricle has mildly decreased function. The left ventricle  demonstrates global hypokinesis. Left ventricular diastolic parameters are  indeterminate.  2. Right ventricular systolic  function is normal. The right ventricular  size is normal. There is normal pulmonary artery systolic pressure. The  estimated right ventricular systolic pressure is 16.1 mmHg.  3. Right atrial size was moderately dilated.  4. Left atrial size  was moderately dilated.  5. The mitral valve is normal in structure. Mild mitral valve  regurgitation. No evidence of mitral stenosis. Moderate mitral annular  calcification.  6. Bioprosthetic aortic valve with mean gradient 8 mmHg (no significant  stenosis). No significant regurgitation.  7. Aortic dilatation noted. There is mild dilatation of the ascending  aorta, measuring 37 mm.  8. The inferior vena cava is dilated in size with >50% respiratory  variability, suggesting right atrial pressure of 8 mmHg.  9. The patient was in atrial fibrillation.    ASSESSMENT AND PLAN:  1.  Persistent atrial fibrillation: Had side effects on amiodarone.  Currently on Tikosyn.  High risk medication monitoring.  CHA2DS2-VASc of 6.  He did have 1 other episode of atrial fibrillation, but had a cardioversion.  It was thought that this was due to him taking [name].  He has not had any further episodes.  Unfortunately, due to his and vascular disease, he would be a poor candidate for ablation.  We Chas Axel hold off for now.  2.  Hypertension: Currently well controlled  3.  Coronary artery disease: Status post CABG with redo CABG.  No current chest pain.  Plan per primary cardiology.    4.  Peripheral arterial disease: Currently followed by vascular surgery  5.  Chronic systolic heart failure: Likely ischemic in nature.  Currently on optimal medical therapy per primary cardiology.    Case discussed with primary cardiology   Current medicines are reviewed at length with the patient today.   The patient does not have concerns regarding his medicines.  The following changes were made today:  none  Labs/ tests ordered today include:  Orders Placed This  Encounter  Procedures  . EKG 12-Lead     Disposition:   FU with Jeralynn Vaquera 6 months  Signed, Azaria Stegman Meredith Leeds, MD  05/06/2021 11:16 AM     [facility] [address] [place] [address] [phone] (office) [phone] (fax)

## 2021-05-10 ENCOUNTER — Other Ambulatory Visit (HOSPITAL_COMMUNITY): Payer: Self-pay | Admitting: Cardiology

## 2021-05-15 ENCOUNTER — Other Ambulatory Visit: Payer: Self-pay

## 2021-05-15 ENCOUNTER — Ambulatory Visit (INDEPENDENT_AMBULATORY_CARE_PROVIDER_SITE_OTHER): Payer: Medicare Other | Admitting: *Deleted

## 2021-05-15 DIAGNOSIS — Z5181 Encounter for therapeutic drug level monitoring: Secondary | ICD-10-CM | POA: Diagnosis not present

## 2021-05-15 DIAGNOSIS — I4891 Unspecified atrial fibrillation: Secondary | ICD-10-CM

## 2021-05-15 LAB — POCT INR: INR: 2.2 (ref 2.0–3.0)

## 2021-05-15 NOTE — Patient Instructions (Signed)
Description   Continue same dosage of Warfarin 3 tablets daily except for 2 tablets on Mondays and Fridays. Recheck INR in 4 weeks. Coumadin clinic [phone].

## 2021-05-28 ENCOUNTER — Encounter (HOSPITAL_BASED_OUTPATIENT_CLINIC_OR_DEPARTMENT_OTHER): Payer: Medicare Other | Admitting: Cardiology

## 2021-06-06 DIAGNOSIS — E1165 Type 2 diabetes mellitus with hyperglycemia: Secondary | ICD-10-CM | POA: Diagnosis not present

## 2021-06-06 DIAGNOSIS — N182 Chronic kidney disease, stage 2 (mild): Secondary | ICD-10-CM | POA: Diagnosis not present

## 2021-06-06 DIAGNOSIS — E782 Mixed hyperlipidemia: Secondary | ICD-10-CM | POA: Diagnosis not present

## 2021-06-06 DIAGNOSIS — M109 Gout, unspecified: Secondary | ICD-10-CM | POA: Diagnosis not present

## 2021-06-06 DIAGNOSIS — I129 Hypertensive chronic kidney disease with stage 1 through stage 4 chronic kidney disease, or unspecified chronic kidney disease: Secondary | ICD-10-CM | POA: Diagnosis not present

## 2021-06-06 DIAGNOSIS — E1159 Type 2 diabetes mellitus with other circulatory complications: Secondary | ICD-10-CM | POA: Diagnosis not present

## 2021-06-06 DIAGNOSIS — E1122 Type 2 diabetes mellitus with diabetic chronic kidney disease: Secondary | ICD-10-CM | POA: Diagnosis not present

## 2021-06-06 DIAGNOSIS — I2510 Atherosclerotic heart disease of native coronary artery without angina pectoris: Secondary | ICD-10-CM | POA: Diagnosis not present

## 2021-06-12 ENCOUNTER — Other Ambulatory Visit: Payer: Self-pay

## 2021-06-12 ENCOUNTER — Ambulatory Visit (INDEPENDENT_AMBULATORY_CARE_PROVIDER_SITE_OTHER): Payer: Medicare Other

## 2021-06-12 DIAGNOSIS — I4891 Unspecified atrial fibrillation: Secondary | ICD-10-CM | POA: Diagnosis not present

## 2021-06-12 LAB — POCT INR: INR: 3.9 — AB (ref 2.0–3.0)

## 2021-06-12 NOTE — Patient Instructions (Signed)
Description   Pt has already taken 2 tablets today, skip tomorrow's dosage of Warfarin, then resume same dosage of Warfarin 3 tablets daily except for 2 tablets on Mondays and Fridays. Recheck INR in 2 weeks, pt requests 3 week follow-up. Coumadin clinic [phone].

## 2021-07-01 ENCOUNTER — Other Ambulatory Visit: Payer: Self-pay

## 2021-07-01 ENCOUNTER — Encounter (HOSPITAL_COMMUNITY): Payer: Self-pay | Admitting: Cardiology

## 2021-07-01 ENCOUNTER — Ambulatory Visit (HOSPITAL_COMMUNITY)
Admission: RE | Admit: 2021-07-01 | Discharge: 2021-07-01 | Disposition: A | Discharge status: Home / Self Care | Payer: Medicare Other | Source: Ambulatory Visit | Attending: Cardiology | Admitting: Cardiology

## 2021-07-01 VITALS — BP 120/70 | HR 57 | Wt 197.6 lb

## 2021-07-01 DIAGNOSIS — E1151 Type 2 diabetes mellitus with diabetic peripheral angiopathy without gangrene: Secondary | ICD-10-CM | POA: Diagnosis not present

## 2021-07-01 DIAGNOSIS — Z79899 Other long term (current) drug therapy: Secondary | ICD-10-CM | POA: Insufficient documentation

## 2021-07-01 DIAGNOSIS — I5042 Chronic combined systolic (congestive) and diastolic (congestive) heart failure: Secondary | ICD-10-CM | POA: Diagnosis not present

## 2021-07-01 DIAGNOSIS — Z951 Presence of aortocoronary bypass graft: Secondary | ICD-10-CM | POA: Insufficient documentation

## 2021-07-01 DIAGNOSIS — E785 Hyperlipidemia, unspecified: Secondary | ICD-10-CM | POA: Insufficient documentation

## 2021-07-01 DIAGNOSIS — Z8774 Personal history of (corrected) congenital malformations of heart and circulatory system: Secondary | ICD-10-CM | POA: Diagnosis not present

## 2021-07-01 DIAGNOSIS — G4733 Obstructive sleep apnea (adult) (pediatric): Secondary | ICD-10-CM | POA: Diagnosis not present

## 2021-07-01 DIAGNOSIS — I110 Hypertensive heart disease with heart failure: Secondary | ICD-10-CM | POA: Insufficient documentation

## 2021-07-01 DIAGNOSIS — I5022 Chronic systolic (congestive) heart failure: Secondary | ICD-10-CM

## 2021-07-01 DIAGNOSIS — Z7901 Long term (current) use of anticoagulants: Secondary | ICD-10-CM | POA: Diagnosis not present

## 2021-07-01 DIAGNOSIS — I2510 Atherosclerotic heart disease of native coronary artery without angina pectoris: Secondary | ICD-10-CM | POA: Insufficient documentation

## 2021-07-01 DIAGNOSIS — I480 Paroxysmal atrial fibrillation: Secondary | ICD-10-CM | POA: Diagnosis not present

## 2021-07-01 DIAGNOSIS — Z953 Presence of xenogenic heart valve: Secondary | ICD-10-CM | POA: Insufficient documentation

## 2021-07-01 LAB — BASIC METABOLIC PANEL
Anion gap: 6 (ref 5–15)
BUN: 28 mg/dL — ABNORMAL HIGH (ref 8–23)
CO2: 27 mmol/L (ref 22–32)
Calcium: 9.1 mg/dL (ref 8.9–10.3)
Chloride: 104 mmol/L (ref 98–111)
Creatinine, Ser: 1.23 mg/dL (ref 0.61–1.24)
GFR, Estimated: 57 mL/min — ABNORMAL LOW (ref >60)
Glucose, Bld: 157 mg/dL — ABNORMAL HIGH (ref 70–99)
Potassium: 4.3 mmol/L (ref 3.5–5.1)
Sodium: 137 mmol/L (ref 135–145)

## 2021-07-01 LAB — CBC
HCT: 45.1 % (ref 39.0–52.0)
Hemoglobin: 14.5 g/dL (ref 13.0–17.0)
MCH: 30.1 pg (ref 26.0–34.0)
MCHC: 32.2 g/dL (ref 30.0–36.0)
MCV: 93.8 fL (ref 80.0–100.0)
Platelets: 186 10*3/uL (ref 150–400)
RBC: 4.81 MIL/uL (ref 4.22–5.81)
RDW: 13.8 % (ref 11.5–15.5)
WBC: 6.3 10*3/uL (ref 4.0–10.5)
nRBC: 0 % (ref 0.0–0.2)

## 2021-07-01 LAB — MAGNESIUM: Magnesium: 2.3 mg/dL (ref 1.7–2.4)

## 2021-07-01 NOTE — Patient Instructions (Signed)
BE SURE to take Repatha every two weeks  Labs today We will only contact you if something comes back abnormal or we need to make some changes. Otherwise no news is good news!  Your physician recommends that you schedule a follow-up appointment in: 3 months with Dr Aundra Dubin   Do the following things EVERYDAY: Weigh yourself in the morning before breakfast. Write it down and keep it in a log. Take your medicines as prescribed Eat low salt foods--Limit salt (sodium) to 2000 mg per day.  Stay as active as you can everyday Limit all fluids for the day to less than 2 liters  milAt the [facility], you and your health needs are our priority. As part of our continuing mission to provide you with exceptional heart care, we have created designated Provider Care Teams. These Care Teams include your primary Cardiologist (physician) and Advanced Practice Providers (APPs- Physician Assistants and Nurse Practitioners) who all work together to provide you with the care you need, when you need it.   You may see any of the following providers on your designated Care Team at your next follow up: Dr Glori Bickers Dr [name] Dr Patrice Paradise, NP Lyda Jester, [place] Ginnie Smart Audry Riles, PharmD   Please be sure to bring in all your medications bottles to every appointment.   If you have any questions or concerns before your next appointment please send [place] a message through [place] or call our office at [phone].    TO LEAVE A MESSAGE FOR THE NURSE SELECT OPTION 2, PLEASE LEAVE A MESSAGE INCLUDING: YOUR NAME DATE OF BIRTH CALL BACK NUMBER REASON FOR CALL**this is important as we prioritize the call backs  YOU WILL RECEIVE A CALL BACK THE SAME DAY AS LONG AS YOU CALL BEFORE 4:00 PM

## 2021-07-01 NOTE — Progress Notes (Signed)
Patient ID: Nathaniel Garriga., male   DOB: Dec 09, 1932, 87 y.o.   MRN: [id number] PCP: Dr. Elyse Hsu Cardiology: Dr. Aundra Dubin  87 y.o. with history of extensive vascular disease including CAD s/p CABG and redo CABG, AAA s/p repair, PAD, and carotid stenosis as well as prior AVR presents for followup of CAD and atrial fibrillation.   He has had documentation of significant peripheral arterial disease.  He gets bilateral calf soreness after walking for about 15 minutes.  This resolves with rest, no rest pain.  No pedal ulcers, though when he gets cuts on his feet, they heal slowly.  Peripheral arterial dopplers done in 10/14 showed > 50% focal SFA stenoses bilaterally.  He has carotid artery disease.  No stroke-like symptoms.  He had a endovascular AAA repair in 5/15.    After his AAA repair, he was noted to be in atrial fibrillation.  He had had a prior episode of atrial fibrillation after his cardiac surgery in 2007.  Atrial fibrillation was rate-controlled.  He was started on warfarin and Toprol XL.  He developed exertional dyspnea and fatigue in atrial fibrillation. I cardioverted him back to NSR on 06/06/14.  I also had him get an echocardiogram in 6/15.  This showed EF worsened to 25-30% with stable bioprosthetic aortic valve, mildly dilated RV with normal systolic function.  Given the fall in EF, I took him for [facility] in 7/15.  This showed stable anatomy.  The LIMA-LAD was patent, the sequential SVG-OM/ramus/D was patent only to ramus but this is the same as the prior cath, and the SVG-PDA was patent with a long 50-60% mid-graft stenosis.  No intervention.  Of note, at cath he was noted to be back in atrial fibrillation.  I then started him on amiodarone.  I took Nathaniel [name] for [place] again on 08/21/14.  This was successful.  He has been off amiodarone due to side effects (hallucinations).  Echo in 1/16 showed recovery of EF to 55-60%.    He was admitted in 85/19 with bronchitis/wheezing/dyspnea and was found to be in  atrial fibrillation with RVR.  He tested positive for metapneumovirus.  Echo showed EF 45-50% with normal bioprosthetic aortic valve. He was diuresed for volume overload while in the hospital. He had DCCV to NSR.    Carotid dopplers in 1/19 showed 80-99% RICA stenosis, he had right CEA in 4/78 with no complications.   At appointment in early 6/20, Nathaniel [name] was noted to be back in atrial fibrillation with RVR and mild CHF.  He was started on Toprol XL and Lasix was increased.  He had TEE-guided DCCV to NSR in 6/20.  TEE showed EF 50%, mild diffuse hypokinesis, mildly decreased RV systolic function, normally functioning bioprosthetic aortic valve.   In 2021, he was admitted for Tikosyn initiation.  In 12/21, he went into atrial fibrillation again and was cardioverted back to NSR.   Echo in 1/22 showed EF 45-50%, diffuse hypokinesis, mildly decreased RV systolic function, moderate biatrial enlargement, bioprosthetic valve with mean gradient 8 mmHg.    In 2/22, he went into atrial fibrillation and had TEE-guided DCCV back to NSR.  TEE showed EF 45-50%.    In 3/22, he was back in the ER with atrial fibrillation triggered by [name] most likely.  He had a cardioversion back to NSR.   He presents today for followup of CHF and atrial fibrillation.  He is in NSR today.  No palpitations.  He is not getting much exercise, has  not played golf recently.  No chest pain.  No significant exertional dyspnea.  No orthopnea/PND.  Weight down 1 lb.  He has not been taking Repatha regularly.   ECG: NSR, iLBBB 116 msec, QTc 453 msec (personally reviewed)  Labs (5/15): K 4.3, creatinine 0.89 Labs (6/15): K 4.3, creatinine 1.4, BNP 112 Labs (7/15): K 4.4, creatinine 1.2, HCT 40.2 Labs (8/15): K 4, creatinine 1.2, BNP 113, LFTs and TSH normal Labs (10/15): K 4.4, creatinine 1.4 Labs (5/16): K 4.7, creatinine 1.18, HDL 52, LDL 98 Labs (6/16): LDL 118, LFTs normal Labs (1/17): HCT 40.2, K 3.7, creatinine 1.04 Labs  (1/19): K 4.9, creatinine 1.19 Labs (2/19): K 4.2, creatinine 0.89 Labs (3/19): LDL 86, HDL 45, TGs 245 Labs (5/19): LDL 110, HDL 39 Labs (6/19): K 4.1, creatinine 1.07 Labs (11/19): LDL 14, HDL 56 Labs (6/20): K 4.1, creatinine 1.0 Labs (7/20): K 4.3, creatinine 1.07 Labs (9/21): LDL 129, hgb 14.8, K 4.1, creatinine 1.02 Labs (12/21): LDL 20, HDL 46, K 4.4, creatinine 1.23 Labs (1/22): LDL 45, HDL 49, K 4.3, creatinine 1.1, BNP 184 Labs (3/22): K 4.4, creatinine 1.10, LDL 15, HDL 49 Labs (6/22): K 4.3, creatinine 1.11, LDL 121  PMH: 1. PAD: Peripheral arterial dopplers in 2012 showed > 75% bilateral SFA stenosis. Peripheral arterial dopplers in 10/14 showed > 50% focal bilateral SFA stenoses.  2. AAA: [place] 4/15 with 4.4 cm AAA but concern for penetrating ulcers. CTA abdomen showed 4.4 cm AAA with penetrating ulcers and possible pseudoaneurysms. Now s/p endovascular repair of AAA in 8/78 with no complications.  3. Carotid stenosis: Carotid dopplers (4/15) with 60-79% bilateral ICA stenosis. Carotid dopplers (11/15) with 60-79% bilateral ICA stenosis.  Carotid dopplers 6/16 with 60-79% bilateral ICA stenosis. - Carotid dopplers (1/18): 60-79% RICA stenosis.  - Carotid dopplers (1/19): 80-99% RICA stenosis => Right CEA in 2/19.  - Carotid dopplers (11/19): 40-59% bilateral stenosis.  - Carotid dopplers (1/21): 1-39% BICA stenosis.  4. CAD: CABG 1989 with LIMA-LAD, SVG-D, seq SVG-ramus and OM, sequential SVG-PDA/PLV. SVG-PDA/PLV found to be occluded on cath prior to AVR, so patient had SVG-PDA with AVR in 2007. LHC (7/15) with LIMA-LAD patent with 40-50% stenosis in LAD after touchdown, sequential SVG-ramus/OM/diagonal with only the ramus branch still intact (known from prior cath), SVG-PDA from original surgery TO, SVG-PDA from redo surgery with long 50-60% mid-graft stenosis.  No target for intervention.  5. Severe aortic stenosis: Bioprosthetic AVR in 2007.  6. Atrial fibrillation: Paroxysmal.  Initially noted after cardiac surgery, then again after AAA repair.  DCCV to NSR 06/06/14.  DCCV to NSR again 08/21/14.  - DCCV to NSR in 1/19.  - DCCV to NSR in 6/20.  - Now on Tikosyn.  - DCCV to NSR 12/21.  - DCCV to NSR 2/22 - DCCV to NSR 3/22 7. Type II diabetes  8. Gout  9. GERD  10. Hyperlipidemia  11. HTN 12. GERD  13. Chronic systolic CHF: Echo (6/76) with EF worsened to 25-30% with grade II diastolic dysfunction, stable bioprosthetic aortic valve, mildly dilated RV with normal systolic function.  Echo (1/16) with EF 55-60%, basal inferior hypokinesis, mild LVH, bioprosthetic aortic valve with mean gradient 13 mmHg, mild Nathaniel, severe LAE.  - Echo (1/19): EF 45-50%, mild Nathaniel, normal bioprosthetic aortic valve.  - TEE (6/20): EF 50% with mild diffuse hypokinesis, mildly decreased RV systolic function with mild RV enlargement, bioprosthetic aortic valve looked ok, mild Nathaniel. - Echo (1/22):  EF 45-50%, diffuse hypokinesis, mildly  decreased RV systolic function, moderate biatrial enlargement, bioprosthetic valve with mean gradient 8 mmHg.   - TEE (2/22): EF 45-50%, RV normal, moderate biatrial enlargement, bioprosthetic AoV with mean gradient 6 mmHg, mild Nathaniel.  14. BPH  15. Lung nodules: Followed by pulmonary.   SH: Widower, 2 children, raised his 3 grand-daughters, lives in [place], [place].  Occasional ETOH, no smoking.   FH: Father died from ruptured AAA.   ROS: All systems reviewed and negative except as per HPI.   Current Outpatient Medications  Medication Sig Dispense Refill   allopurinol (ZYLOPRIM) 300 MG tablet Take 300 mg by mouth daily.   2   atorvastatin (LIPITOR) 10 MG tablet TAKE ONE TABLET DAILY 30 tablet 11   atorvastatin (LIPITOR) 20 MG tablet Take 20 mg by mouth daily.     Coenzyme Q10 (CO Q10) 200 MG CAPS Take 200 mg by mouth daily.     dofetilide (TIKOSYN) 250 MCG capsule TAKE ONE CAPSULE TWICE A DAY 60 capsule 11   empagliflozin (JARDIANCE) 10 MG TABS tablet  Take 10 mg by mouth daily.     Evolocumab (REPATHA SURECLICK) 856 MG/ML SOAJ Inject 1 pen into the skin every 14 (fourteen) days. 2 mL 11   ezetimibe (ZETIA) 10 MG tablet Take 1 tablet (10 mg total) by mouth at bedtime. 30 tablet 3   furosemide (LASIX) 20 MG tablet Take 20-40 mg by mouth as directed. Rotating every other day; 20mg  one day and 40mg  the next day     latanoprost (XALATAN) 0.005 % ophthalmic solution Place 1 drop into both eyes daily.     melatonin 3 MG TABS tablet Take 3 mg by mouth at bedtime. 30 min before bed     Metoprolol Succinate 25 MG CS24 Take 25 mg by mouth 2 (two) times daily.     Misc Natural Products (GLUCOSAMINE CHONDROITIN TRIPLE) TABS Take 1 tablet by mouth daily.     ramipril (ALTACE) 2.5 MG capsule Take 1 capsule (2.5 mg total) by mouth 2 (two) times daily. 90 capsule 3   spironolactone (ALDACTONE) 25 MG tablet TAKE ONE TABLET DAILY 30 tablet 3   tamsulosin (FLOMAX) 0.4 MG CAPS capsule Take 0.4 mg by mouth 2 (two) times daily.     timolol (TIMOPTIC) 0.5 % ophthalmic solution Place 1 drop into both eyes at bedtime.  5   warfarin (COUMADIN) 3 MG tablet TAKE 3 TABLETS DAILY EXCEPT 2 TABLETS ON MONDAY AND FRIDAY OR AS DIRECTED BY CLINIC 90 tablet 2   No current facility-administered medications for this encounter.    BP 120/70   Pulse (!) 57   Wt 89.6 kg (197 lb 9.6 oz)   SpO2 94%   BMI 27.95 kg/m    Wt Readings from Last 3 Encounters:  07/01/21 89.6 kg (197 lb 9.6 oz)  05/06/21 88 kg (194 lb)  03/28/21 90 kg (198 lb 6.4 oz)   PHYSICAL EXAM: General: NAD Neck: No JVD, no thyromegaly or thyroid nodule.  Lungs: Clear to auscultation bilaterally with normal respiratory effort. CV: Nondisplaced PMI.  Heart regular S1/S2, no S3/S4, no murmur.  No peripheral edema.  No carotid bruit.  Normal pedal pulses.  Abdomen: Soft, nontender, no hepatosplenomegaly, no distention.  Skin: Intact without lesions or rashes.  Neurologic: Alert and oriented x 3.  Psych:  Normal affect. Extremities: No clubbing or cyanosis.  HEENT: Normal.   Assessment/Plan:  1. CAD: s/p CABG and redo CABG.  Given fall in EF, I did a cardiac cath  in 7/15 as documented above. Unfortunately, despite significant coronary disease, there were no good interventional targets.  No recent chest pain.  - Continue atorvastatin, Zetia, and Repatha => needs to be more regular with taking Repatha.  - He is on warfarin in setting of stable CAD so does not need ASA 81.   2. Hyperlipidemia: He is taking atorvastatin + Zetia and Repatha. Needs to be more regular with taking Repatha, LDL up to 121 in 6/22.  - Take Repatha every 2 wks and check lipids at next appointment in 3 months.  3. PAD: Patient denies claudication or foot ulcers. Peripheral arterial dopplers showed > 50% bilateral focal SFA stenoses in the past.  He is not a cilostazol candidate with CHF. He is followed by Dr. Trula Slade at [facility], had recent appt. No current claudication symptoms.  4. Carotid stenosis: S/p right CEA in 2/19.  Followed by Dr. Trula Slade at [facility]. Carotid Dopplers completed 11/21-->bilateral ICA without significant stenosis but >50% common carotid stenosis on left. - Followup at [facility].  5. AAA: Successful endovascular repair in 5/15.  Followed by Dr. Trula Slade at [facility].  6. Bioprosthetic AVR: Valve stable on 2/22 TEE.  7. Atrial fibrillation: Possible CNS side effects with amiodarone.  Given worsening CHF with atrial fibrillation and difficulty with rate control, I think that we need to maintain NSR.  He is on Tikosyn. Several breakthrough episodes in the last 6 months.  He saw EP and was offered ablation but decided against it. He is in NSR.  - Continue Tikosyn, I do not think he has other anti-arrhythmic options (did not tolerate amiodarone).  QTc ok on ECG today.  - Continue warfarin. CBC today.  - Continue Toprol XL 25 mg bid.  8. Chronic primarily diastolic CHF: Suspect ischemic cardiomyopathy.  EF 25-30% in 6/15 but had  normalized to 55-60% by 1/16.  Down to 45-50% on 1/19 echo but was in the setting of atrial fibrillation.  TEE in 6/20 showed EF 50%. TEE in 2/22 showed EF 45-50%.  NYHA class II-III symptoms, worse in atrial fibrillation.  He is not significantly volume overloaded today and weight is down.  He tolerates AF poorly, with worsening of HF symptoms.   - Continue Lasix 40 daily alternating with 20 mg daily.     - Continue ramipril 2.5 mg bid.   - Continue spironolactone 25 mg daily.  - Continue Jardiance 10 mg daily.  - Continue Toprol XL 25 mg bid.  9. HTN: BP generally controlled.    10. Diabetes: He is on Jardiance.  No GU symptoms. 11. OSA: Daytime sleepiness and fatigue. He says that he will not use CPAP so we will defer sleep study.   Followup in 3 months.   [name] 07/01/2021

## 2021-07-03 ENCOUNTER — Ambulatory Visit (INDEPENDENT_AMBULATORY_CARE_PROVIDER_SITE_OTHER): Payer: Medicare Other | Admitting: *Deleted

## 2021-07-03 ENCOUNTER — Other Ambulatory Visit: Payer: Self-pay

## 2021-07-03 DIAGNOSIS — Z5181 Encounter for therapeutic drug level monitoring: Secondary | ICD-10-CM

## 2021-07-03 DIAGNOSIS — I4891 Unspecified atrial fibrillation: Secondary | ICD-10-CM | POA: Diagnosis not present

## 2021-07-03 LAB — POCT INR: INR: 2.2 (ref 2.0–3.0)

## 2021-07-03 NOTE — Patient Instructions (Signed)
Description   Continue taking Warfarin 3 tablets daily except for 2 tablets on Mondays and Fridays. Recheck INR in 4 weeks. Coumadin clinic [phone].

## 2021-07-31 ENCOUNTER — Ambulatory Visit (INDEPENDENT_AMBULATORY_CARE_PROVIDER_SITE_OTHER): Payer: Medicare Other

## 2021-07-31 ENCOUNTER — Other Ambulatory Visit: Payer: Self-pay

## 2021-07-31 DIAGNOSIS — Z5181 Encounter for therapeutic drug level monitoring: Secondary | ICD-10-CM

## 2021-07-31 DIAGNOSIS — I4891 Unspecified atrial fibrillation: Secondary | ICD-10-CM

## 2021-07-31 LAB — POCT INR: INR: 2.5 (ref 2.0–3.0)

## 2021-07-31 NOTE — Patient Instructions (Signed)
Description   Continue taking Warfarin 3 tablets daily except for 2 tablets on Mondays and Fridays. Recheck INR in 5 weeks. Coumadin clinic [phone].

## 2021-08-05 DIAGNOSIS — H401122 Primary open-angle glaucoma, left eye, moderate stage: Secondary | ICD-10-CM | POA: Diagnosis not present

## 2021-08-05 DIAGNOSIS — H35373 Puckering of macula, bilateral: Secondary | ICD-10-CM | POA: Diagnosis not present

## 2021-08-05 DIAGNOSIS — Z961 Presence of intraocular lens: Secondary | ICD-10-CM | POA: Diagnosis not present

## 2021-08-21 DIAGNOSIS — L821 Other seborrheic keratosis: Secondary | ICD-10-CM | POA: Diagnosis not present

## 2021-08-21 DIAGNOSIS — Z85828 Personal history of other malignant neoplasm of skin: Secondary | ICD-10-CM | POA: Diagnosis not present

## 2021-08-21 DIAGNOSIS — D1801 Hemangioma of skin and subcutaneous tissue: Secondary | ICD-10-CM | POA: Diagnosis not present

## 2021-08-21 DIAGNOSIS — D2261 Melanocytic nevi of right upper limb, including shoulder: Secondary | ICD-10-CM | POA: Diagnosis not present

## 2021-08-21 DIAGNOSIS — L570 Actinic keratosis: Secondary | ICD-10-CM | POA: Diagnosis not present

## 2021-08-21 DIAGNOSIS — L812 Freckles: Secondary | ICD-10-CM | POA: Diagnosis not present

## 2021-08-21 DIAGNOSIS — D485 Neoplasm of uncertain behavior of skin: Secondary | ICD-10-CM | POA: Diagnosis not present

## 2021-08-21 DIAGNOSIS — C4441 Basal cell carcinoma of skin of scalp and neck: Secondary | ICD-10-CM | POA: Diagnosis not present

## 2021-09-03 ENCOUNTER — Telehealth: Payer: Self-pay | Admitting: *Deleted

## 2021-09-03 NOTE — Telephone Encounter (Signed)
Called pt since he canceled tomorrow's appt due to showing up on the wrong day and not willing to wait to be worked in to be seen. Greeter, Otho Ket, stated he could not wait and he prefers to cancel appt and get rescheduled. While on the phone she told pt to call back to reschedule. Since he hasn't called I called pt but there was no answer and there was no voicemail. Will have to call pt back at a later date.

## 2021-09-05 ENCOUNTER — Ambulatory Visit (INDEPENDENT_AMBULATORY_CARE_PROVIDER_SITE_OTHER): Payer: Medicare Other

## 2021-09-05 ENCOUNTER — Other Ambulatory Visit: Payer: Self-pay

## 2021-09-05 DIAGNOSIS — Z5181 Encounter for therapeutic drug level monitoring: Secondary | ICD-10-CM | POA: Diagnosis not present

## 2021-09-05 DIAGNOSIS — I4891 Unspecified atrial fibrillation: Secondary | ICD-10-CM | POA: Diagnosis not present

## 2021-09-05 LAB — POCT INR: INR: 2 (ref 2.0–3.0)

## 2021-09-05 NOTE — Patient Instructions (Signed)
Description   Continue taking Warfarin 3 tablets daily except for 2 tablets on Mondays and Fridays. Recheck INR in 5 weeks. Coumadin clinic [phone].

## 2021-09-18 ENCOUNTER — Other Ambulatory Visit (HOSPITAL_COMMUNITY): Payer: Self-pay | Admitting: Cardiology

## 2021-09-18 DIAGNOSIS — Z85828 Personal history of other malignant neoplasm of skin: Secondary | ICD-10-CM | POA: Diagnosis not present

## 2021-09-18 DIAGNOSIS — C4441 Basal cell carcinoma of skin of scalp and neck: Secondary | ICD-10-CM | POA: Diagnosis not present

## 2021-10-01 ENCOUNTER — Encounter (HOSPITAL_COMMUNITY): Payer: Medicare Other | Admitting: Cardiology

## 2021-10-03 ENCOUNTER — Other Ambulatory Visit: Payer: Self-pay

## 2021-10-03 ENCOUNTER — Encounter (HOSPITAL_COMMUNITY): Payer: Self-pay | Admitting: Cardiology

## 2021-10-03 ENCOUNTER — Ambulatory Visit (HOSPITAL_COMMUNITY)
Admission: RE | Admit: 2021-10-03 | Discharge: 2021-10-03 | Disposition: A | Discharge status: Home / Self Care | Payer: Medicare Other | Source: Ambulatory Visit | Attending: Cardiology | Admitting: Cardiology

## 2021-10-03 VITALS — BP 116/58 | HR 60 | Wt 200.8 lb

## 2021-10-03 DIAGNOSIS — I110 Hypertensive heart disease with heart failure: Secondary | ICD-10-CM | POA: Insufficient documentation

## 2021-10-03 DIAGNOSIS — Z7984 Long term (current) use of oral hypoglycemic drugs: Secondary | ICD-10-CM | POA: Insufficient documentation

## 2021-10-03 DIAGNOSIS — G4733 Obstructive sleep apnea (adult) (pediatric): Secondary | ICD-10-CM | POA: Insufficient documentation

## 2021-10-03 DIAGNOSIS — E785 Hyperlipidemia, unspecified: Secondary | ICD-10-CM | POA: Diagnosis not present

## 2021-10-03 DIAGNOSIS — I5042 Chronic combined systolic (congestive) and diastolic (congestive) heart failure: Secondary | ICD-10-CM | POA: Diagnosis not present

## 2021-10-03 DIAGNOSIS — Z79899 Other long term (current) drug therapy: Secondary | ICD-10-CM | POA: Diagnosis not present

## 2021-10-03 DIAGNOSIS — Z953 Presence of xenogenic heart valve: Secondary | ICD-10-CM | POA: Diagnosis not present

## 2021-10-03 DIAGNOSIS — I2510 Atherosclerotic heart disease of native coronary artery without angina pectoris: Secondary | ICD-10-CM | POA: Insufficient documentation

## 2021-10-03 DIAGNOSIS — I480 Paroxysmal atrial fibrillation: Secondary | ICD-10-CM | POA: Diagnosis not present

## 2021-10-03 DIAGNOSIS — Z7901 Long term (current) use of anticoagulants: Secondary | ICD-10-CM | POA: Diagnosis not present

## 2021-10-03 DIAGNOSIS — I5022 Chronic systolic (congestive) heart failure: Secondary | ICD-10-CM

## 2021-10-03 DIAGNOSIS — Z8774 Personal history of (corrected) congenital malformations of heart and circulatory system: Secondary | ICD-10-CM | POA: Insufficient documentation

## 2021-10-03 DIAGNOSIS — Z951 Presence of aortocoronary bypass graft: Secondary | ICD-10-CM | POA: Insufficient documentation

## 2021-10-03 DIAGNOSIS — E119 Type 2 diabetes mellitus without complications: Secondary | ICD-10-CM | POA: Insufficient documentation

## 2021-10-03 LAB — LIPID PANEL
Cholesterol: 203 mg/dL — ABNORMAL HIGH (ref 0–200)
HDL: 49 mg/dL (ref >40)
LDL Cholesterol: 113 mg/dL — ABNORMAL HIGH (ref 0–99)
Total CHOL/HDL Ratio: 4.1 RATIO
Triglycerides: 203 mg/dL — ABNORMAL HIGH (ref <150)
VLDL: 41 mg/dL — ABNORMAL HIGH (ref 0–40)

## 2021-10-03 LAB — BASIC METABOLIC PANEL
Anion gap: 11 (ref 5–15)
BUN: 21 mg/dL (ref 8–23)
CO2: 22 mmol/L (ref 22–32)
Calcium: 9 mg/dL (ref 8.9–10.3)
Chloride: 104 mmol/L (ref 98–111)
Creatinine, Ser: 1.19 mg/dL (ref 0.61–1.24)
GFR, Estimated: 59 mL/min — ABNORMAL LOW (ref >60)
Glucose, Bld: 207 mg/dL — ABNORMAL HIGH (ref 70–99)
Potassium: 4 mmol/L (ref 3.5–5.1)
Sodium: 137 mmol/L (ref 135–145)

## 2021-10-03 LAB — MAGNESIUM: Magnesium: 1.8 mg/dL (ref 1.7–2.4)

## 2021-10-03 NOTE — Progress Notes (Signed)
Patient ID: Nathaniel [name]., male   DOB: [birth year], 87 y.o.   MRN: [id number] PCP: Dr. Elyse Hsu Cardiology: Dr. Aundra Dubin  87 y.o. with history of extensive vascular disease including CAD s/p CABG and redo CABG, AAA s/p repair, PAD, and carotid stenosis as well as prior AVR presents for followup of CAD and atrial fibrillation.   He has had documentation of significant peripheral arterial disease.  He gets bilateral calf soreness after walking for about 15 minutes.  This resolves with rest, no rest pain.  No pedal ulcers, though when he gets cuts on his feet, they heal slowly.  Peripheral arterial dopplers done in 10/14 showed > 50% focal SFA stenoses bilaterally.  He has carotid artery disease.  No stroke-like symptoms.  He had a endovascular AAA repair in 5/15.    After his AAA repair, he was noted to be in atrial fibrillation.  He had had a prior episode of atrial fibrillation after his cardiac surgery in 2007.  Atrial fibrillation was rate-controlled.  He was started on warfarin and Toprol XL.  He developed exertional dyspnea and fatigue in atrial fibrillation. I cardioverted him back to NSR on 06/06/14.  I also had him get an echocardiogram in 6/15.  This showed EF worsened to 25-30% with stable bioprosthetic aortic valve, mildly dilated RV with normal systolic function.  Given the fall in EF, I took him for [facility] in 7/15.  This showed stable anatomy.  The LIMA-LAD was patent, the sequential SVG-OM/ramus/D was patent only to ramus but this is the same as the prior cath, and the SVG-PDA was patent with a long 50-60% mid-graft stenosis.  No intervention.  Of note, at cath he was noted to be back in atrial fibrillation.  I then started him on amiodarone.  I took Nathaniel [name] for [place] again on 08/21/14.  This was successful.  He has been off amiodarone due to side effects (hallucinations).  Echo in 1/16 showed recovery of EF to 55-60%.    He was admitted in 1/19 with bronchitis/wheezing/dyspnea and was found to be in  atrial fibrillation with RVR.  He tested positive for metapneumovirus.  Echo showed EF 45-50% with normal bioprosthetic aortic valve. He was diuresed for volume overload while in the hospital. He had DCCV to NSR.    Carotid dopplers in 1/19 showed 80-99% RICA stenosis, he had right CEA in 6/96 with no complications.   At appointment in early 6/20, Nathaniel [name] was noted to be back in atrial fibrillation with RVR and mild CHF.  He was started on Toprol XL and Lasix was increased.  He had TEE-guided DCCV to NSR in 6/20.  TEE showed EF 50%, mild diffuse hypokinesis, mildly decreased RV systolic function, normally functioning bioprosthetic aortic valve.   In 2021, he was admitted for Tikosyn initiation.  In 12/21, he went into atrial fibrillation again and was cardioverted back to NSR.   Echo in 1/22 showed EF 45-50%, diffuse hypokinesis, mildly decreased RV systolic function, moderate biatrial enlargement, bioprosthetic valve with mean gradient 8 mmHg.    In 2/22, he went into atrial fibrillation and had TEE-guided DCCV back to NSR.  TEE showed EF 45-50%.    In 3/22, he was back in the ER with atrial fibrillation triggered by [name] most likely.  He had a cardioversion back to NSR.   He presents today for followup of CHF and atrial fibrillation.  He is in NSR today.  No palpitations.  Still not taking Repatha regularly.  No  dyspnea walking on flat ground.  He does get short of breath at times walking up hills and stairs.  Weight up 3 lbs.  No chest pain.  No BRBPR/melena. Still active trading stocks.     ECG: NSR, iLBBB 114 msec, QTc 461 msec (personally reviewed)  Labs (5/15): K 4.3, creatinine 0.89 Labs (6/15): K 4.3, creatinine 1.4, BNP 112 Labs (7/15): K 4.4, creatinine 1.2, HCT 40.2 Labs (8/15): K 4, creatinine 1.2, BNP 113, LFTs and TSH normal Labs (10/15): K 4.4, creatinine 1.4 Labs (5/16): K 4.7, creatinine 1.18, HDL 52, LDL 98 Labs (6/16): LDL 118, LFTs normal Labs (1/17): HCT 40.2, K  3.7, creatinine 1.04 Labs (1/19): K 4.9, creatinine 1.19 Labs (2/19): K 4.2, creatinine 0.89 Labs (3/19): LDL 86, HDL 45, TGs 245 Labs (5/19): LDL 110, HDL 39 Labs (6/19): K 4.1, creatinine 1.07 Labs (11/19): LDL 14, HDL 56 Labs (6/20): K 4.1, creatinine 1.0 Labs (7/20): K 4.3, creatinine 1.07 Labs (9/21): LDL 129, hgb 14.8, K 4.1, creatinine 1.02 Labs (12/21): LDL 20, HDL 46, K 4.4, creatinine 1.23 Labs (1/22): LDL 45, HDL 49, K 4.3, creatinine 1.1, BNP 184 Labs (3/22): K 4.4, creatinine 1.10, LDL 15, HDL 49 Labs (6/22): K 4.3, creatinine 1.11, LDL 121 Labs (7/22): K 4.3, creatinine 1.23, hgb 14.5  PMH: 1. PAD: Peripheral arterial dopplers in 2012 showed > 75% bilateral SFA stenosis. Peripheral arterial dopplers in 10/14 showed > 50% focal bilateral SFA stenoses.  2. AAA: [place] 4/15 with 4.4 cm AAA but concern for penetrating ulcers. CTA abdomen showed 4.4 cm AAA with penetrating ulcers and possible pseudoaneurysms. Now s/p endovascular repair of AAA in 7/90 with no complications.  3. Carotid stenosis: Carotid dopplers (4/15) with 60-79% bilateral ICA stenosis. Carotid dopplers (11/15) with 60-79% bilateral ICA stenosis.  Carotid dopplers 6/16 with 60-79% bilateral ICA stenosis. - Carotid dopplers (1/18): 60-79% RICA stenosis.  - Carotid dopplers (1/19): 80-99% RICA stenosis => Right CEA in 2/19.  - Carotid dopplers (11/19): 40-59% bilateral stenosis.  - Carotid dopplers (1/21): 1-39% BICA stenosis.  4. CAD: CABG 1989 with LIMA-LAD, SVG-D, seq SVG-ramus and OM, sequential SVG-PDA/PLV. SVG-PDA/PLV found to be occluded on cath prior to AVR, so patient had SVG-PDA with AVR in 2007. LHC (7/15) with LIMA-LAD patent with 40-50% stenosis in LAD after touchdown, sequential SVG-ramus/OM/diagonal with only the ramus branch still intact (known from prior cath), SVG-PDA from original surgery TO, SVG-PDA from redo surgery with long 50-60% mid-graft stenosis.  No target for intervention.  5. Severe aortic  stenosis: Bioprosthetic AVR in 2007.  6. Atrial fibrillation: Paroxysmal. Initially noted after cardiac surgery, then again after AAA repair.  DCCV to NSR 06/06/14.  DCCV to NSR again 08/21/14.  - DCCV to NSR in 1/19.  - DCCV to NSR in 6/20.  - Now on Tikosyn.  - DCCV to NSR 12/21.  - DCCV to NSR 2/22 - DCCV to NSR 3/22 7. Type II diabetes  8. Gout  9. GERD  10. Hyperlipidemia  11. HTN 12. GERD  13. Chronic systolic CHF: Echo (2/40) with EF worsened to 25-30% with grade II diastolic dysfunction, stable bioprosthetic aortic valve, mildly dilated RV with normal systolic function.  Echo (1/16) with EF 55-60%, basal inferior hypokinesis, mild LVH, bioprosthetic aortic valve with mean gradient 13 mmHg, mild Nathaniel, severe LAE.  - Echo (1/19): EF 45-50%, mild Nathaniel, normal bioprosthetic aortic valve.  - TEE (6/20): EF 50% with mild diffuse hypokinesis, mildly decreased RV systolic function with mild RV enlargement,  bioprosthetic aortic valve looked ok, mild Nathaniel. - Echo (1/22):  EF 45-50%, diffuse hypokinesis, mildly decreased RV systolic function, moderate biatrial enlargement, bioprosthetic valve with mean gradient 8 mmHg.   - TEE (2/22): EF 45-50%, RV normal, moderate biatrial enlargement, bioprosthetic AoV with mean gradient 6 mmHg, mild Nathaniel.  14. BPH  15. Lung nodules: Followed by pulmonary.   SH: Widower, 2 children, raised his 3 grand-daughters, lives in [place], [place].  Occasional ETOH, no smoking.   FH: Father died from ruptured AAA.   ROS: All systems reviewed and negative except as per HPI.   Current Outpatient Medications  Medication Sig Dispense Refill   allopurinol (ZYLOPRIM) 300 MG tablet Take 300 mg by mouth daily.   2   atorvastatin (LIPITOR) 10 MG tablet TAKE ONE TABLET DAILY 30 tablet 11   atorvastatin (LIPITOR) 20 MG tablet Take 20 mg by mouth daily.     Coenzyme Q10 (CO Q10) 200 MG CAPS Take 200 mg by mouth daily.     dofetilide (TIKOSYN) 250 MCG capsule TAKE ONE CAPSULE  TWICE A DAY 60 capsule 11   empagliflozin (JARDIANCE) 10 MG TABS tablet Take 10 mg by mouth daily.     Evolocumab (REPATHA SURECLICK) 756 MG/ML SOAJ Inject 1 pen into the skin every 14 (fourteen) days. 2 mL 11   ezetimibe (ZETIA) 10 MG tablet Take 1 tablet (10 mg total) by mouth at bedtime. 30 tablet 3   furosemide (LASIX) 20 MG tablet Take 20-40 mg by mouth as directed. Rotating every other day; 20mg  one day and 40mg  the next day     latanoprost (XALATAN) 0.005 % ophthalmic solution Place 1 drop into both eyes daily.     Metoprolol Succinate 25 MG CS24 Take 25 mg by mouth 2 (two) times daily.     Misc Natural Products (GLUCOSAMINE CHONDROITIN TRIPLE) TABS Take 1 tablet by mouth daily.     ramipril (ALTACE) 2.5 MG capsule Take 1 capsule (2.5 mg total) by mouth 2 (two) times daily. 90 capsule 3   spironolactone (ALDACTONE) 25 MG tablet TAKE ONE TABLET DAILY 30 tablet 3   tamsulosin (FLOMAX) 0.4 MG CAPS capsule Take 0.4 mg by mouth 2 (two) times daily.     timolol (TIMOPTIC) 0.5 % ophthalmic solution Place 1 drop into both eyes at bedtime.  5   warfarin (COUMADIN) 3 MG tablet TAKE 3 TABLETS DAILY EXCEPT 2 TABLETS ONMONDAY AND FRIDAY OR AS DIRECTED BY CLINIC 90 tablet 2   No current facility-administered medications for this encounter.    BP (!) 116/58   Pulse 60   Wt 91.1 kg (200 lb 12.8 oz)   SpO2 96%   BMI 28.40 kg/m    Wt Readings from Last 3 Encounters:  10/03/21 91.1 kg (200 lb 12.8 oz)  07/01/21 89.6 kg (197 lb 9.6 oz)  05/06/21 88 kg (194 lb)   PHYSICAL EXAM: General: NAD Neck: No JVD, no thyromegaly or thyroid nodule.  Lungs: Clear to auscultation bilaterally with normal respiratory effort. CV: Nondisplaced PMI.  Heart regular S1/S2, no S3/S4, 2/6 SEM RUSB with clear S2.  Trace ankle edema.  No carotid bruit.  Difficult to palpate pedal pulses.  Abdomen: Soft, nontender, no hepatosplenomegaly, no distention.  Skin: Intact without lesions or rashes.  Neurologic: Alert and  oriented x 3.  Psych: Normal affect. Extremities: No clubbing or cyanosis.  HEENT: Normal.   Assessment/Plan:  1. CAD: s/p CABG and redo CABG.  Given fall in EF, I did a cardiac  cath in 7/15 as documented above. Unfortunately, despite significant coronary disease, there were no good interventional targets.  No recent chest pain.  - Continue atorvastatin, Zetia, and Repatha => needs to be more regular with taking Repatha.  - He is on warfarin in setting of stable CAD so does not need ASA 81.   2. Hyperlipidemia: He is taking atorvastatin + Zetia and Repatha. Needs to be more regular with taking Repatha (has not been taking recently).  - Check lipids today.   - Needs to take Repatha every 2 wks, he will write it on his calendar.  3. PAD: Patient denies claudication or foot ulcers. Peripheral arterial dopplers showed > 50% bilateral focal SFA stenoses in the past.  He is not a cilostazol candidate with CHF. He is followed by Dr. Trula Slade at [facility], had recent appt. No current claudication symptoms.  - Has appointment at [facility] in 12/22.  4. Carotid stenosis: S/p right CEA in 2/19.  Followed by Dr. Trula Slade at [facility]. Carotid Dopplers completed 11/21-->bilateral ICA without significant stenosis but >50% common carotid stenosis on left. - Followup at [facility] in 12/22.  5. AAA: Successful endovascular repair in 5/15.  Followed by Dr. Trula Slade at [facility].  6. Bioprosthetic AVR: Valve stable on 2/22 TEE.  7. Atrial fibrillation: Possible CNS side effects with amiodarone.  Given worsening CHF with atrial fibrillation and difficulty with rate control, I think that we need to maintain NSR.  He is on Tikosyn. Several breakthrough episodes in the last 6 months.  He saw EP and was offered ablation but decided against it. He is in NSR.   - Avoid OTC stimulants (had AF with use of [name]).  - Continue Tikosyn, I do not think he has other anti-arrhythmic options (did not tolerate amiodarone).  QTc ok on ECG today.  - Continue  warfarin.  - Continue Toprol XL 25 mg bid.  8. Chronic primarily diastolic CHF: Suspect ischemic cardiomyopathy.  EF 25-30% in 6/15 but had normalized to 55-60% by 1/16.  Down to 45-50% on 1/19 echo but was in the setting of atrial fibrillation.  TEE in 6/20 showed EF 50%. TEE in 2/22 showed EF 45-50%.  NYHA class II-III symptoms, worse in atrial fibrillation.  He is not significantly volume overloaded today and weight is down.  He tolerates AF poorly, with worsening of HF symptoms.   - Continue Lasix 40 daily alternating with 20 mg daily.     - Continue ramipril 2.5 mg bid.   - Continue spironolactone 25 mg daily.  - Continue Jardiance 10 mg daily.  - Continue Toprol XL 25 mg bid.  9. HTN: BP generally controlled.    10. Diabetes: He is on Jardiance.  No GU symptoms. 11. OSA: Daytime sleepiness and fatigue. He says that he will not use CPAP so we will defer sleep study.   Followup in 4 months.   [name] 10/03/2021

## 2021-10-03 NOTE — Patient Instructions (Signed)
Labs today We will only contact you if something comes back abnormal or we need to make some changes. Otherwise no news is good news!  Your physician recommends that you schedule a follow-up appointment in: 4 months with Dr Aundra Dubin  Please call office at [phone] option 2 if you have any questions or concerns.   At the [facility], you and your health needs are our priority. As part of our continuing mission to provide you with exceptional heart care, we have created designated Provider Care Teams. These Care Teams include your primary Cardiologist (physician) and Advanced Practice Providers (APPs- Physician Assistants and Nurse Practitioners) who all work together to provide you with the care you need, when you need it.   You may see any of the following providers on your designated Care Team at your next follow up: Dr Glori Bickers Dr Haynes Kerns, NP Lyda Jester, [place] [facility] [place], [place] Audry Riles, PharmD   Please be sure to bring in all your medications bottles to every appointment.

## 2021-10-09 ENCOUNTER — Other Ambulatory Visit: Payer: Self-pay

## 2021-10-09 ENCOUNTER — Ambulatory Visit: Payer: Medicare Other

## 2021-10-09 DIAGNOSIS — I4891 Unspecified atrial fibrillation: Secondary | ICD-10-CM

## 2021-10-09 DIAGNOSIS — Z5181 Encounter for therapeutic drug level monitoring: Secondary | ICD-10-CM | POA: Diagnosis not present

## 2021-10-09 LAB — POCT INR: INR: 2.6 (ref 2.0–3.0)

## 2021-10-09 NOTE — Patient Instructions (Signed)
Description   Continue on same dosage of Warfarin 3 tablets daily except for 2 tablets on Mondays and Fridays. Recheck INR in 6 weeks. Coumadin clinic [phone].

## 2021-10-10 ENCOUNTER — Inpatient Hospital Stay (HOSPITAL_BASED_OUTPATIENT_CLINIC_OR_DEPARTMENT_OTHER)
Admission: EM | Admit: 2021-10-10 | Discharge: 2021-10-14 | DRG: 565 | Disposition: A | Discharge status: Skilled Nursing Facility | Payer: Medicare Other | Attending: Internal Medicine | Admitting: Internal Medicine

## 2021-10-10 ENCOUNTER — Other Ambulatory Visit: Payer: Self-pay

## 2021-10-10 ENCOUNTER — Encounter (HOSPITAL_BASED_OUTPATIENT_CLINIC_OR_DEPARTMENT_OTHER): Payer: Self-pay

## 2021-10-10 DIAGNOSIS — N182 Chronic kidney disease, stage 2 (mild): Secondary | ICD-10-CM | POA: Diagnosis present

## 2021-10-10 DIAGNOSIS — R7989 Other specified abnormal findings of blood chemistry: Secondary | ICD-10-CM | POA: Diagnosis present

## 2021-10-10 DIAGNOSIS — I471 Supraventricular tachycardia: Secondary | ICD-10-CM | POA: Diagnosis present

## 2021-10-10 DIAGNOSIS — Z87891 Personal history of nicotine dependence: Secondary | ICD-10-CM

## 2021-10-10 DIAGNOSIS — E785 Hyperlipidemia, unspecified: Secondary | ICD-10-CM | POA: Diagnosis present

## 2021-10-10 DIAGNOSIS — Z951 Presence of aortocoronary bypass graft: Secondary | ICD-10-CM

## 2021-10-10 DIAGNOSIS — K219 Gastro-esophageal reflux disease without esophagitis: Secondary | ICD-10-CM | POA: Diagnosis present

## 2021-10-10 DIAGNOSIS — Z20822 Contact with and (suspected) exposure to covid-19: Secondary | ICD-10-CM | POA: Diagnosis present

## 2021-10-10 DIAGNOSIS — R54 Age-related physical debility: Secondary | ICD-10-CM | POA: Diagnosis present

## 2021-10-10 DIAGNOSIS — N401 Enlarged prostate with lower urinary tract symptoms: Secondary | ICD-10-CM | POA: Diagnosis present

## 2021-10-10 DIAGNOSIS — E1151 Type 2 diabetes mellitus with diabetic peripheral angiopathy without gangrene: Secondary | ICD-10-CM | POA: Diagnosis not present

## 2021-10-10 DIAGNOSIS — Z91011 Allergy to milk products: Secondary | ICD-10-CM

## 2021-10-10 DIAGNOSIS — Z9109 Other allergy status, other than to drugs and biological substances: Secondary | ICD-10-CM | POA: Diagnosis not present

## 2021-10-10 DIAGNOSIS — I5042 Chronic combined systolic (congestive) and diastolic (congestive) heart failure: Secondary | ICD-10-CM | POA: Diagnosis present

## 2021-10-10 DIAGNOSIS — R338 Other retention of urine: Secondary | ICD-10-CM | POA: Diagnosis present

## 2021-10-10 DIAGNOSIS — E1122 Type 2 diabetes mellitus with diabetic chronic kidney disease: Secondary | ICD-10-CM | POA: Diagnosis not present

## 2021-10-10 DIAGNOSIS — I130 Hypertensive heart and chronic kidney disease with heart failure and stage 1 through stage 4 chronic kidney disease, or unspecified chronic kidney disease: Secondary | ICD-10-CM | POA: Diagnosis present

## 2021-10-10 DIAGNOSIS — G4733 Obstructive sleep apnea (adult) (pediatric): Secondary | ICD-10-CM | POA: Diagnosis present

## 2021-10-10 DIAGNOSIS — I4819 Other persistent atrial fibrillation: Secondary | ICD-10-CM | POA: Diagnosis not present

## 2021-10-10 DIAGNOSIS — L89891 Pressure ulcer of other site, stage 1: Secondary | ICD-10-CM | POA: Diagnosis present

## 2021-10-10 DIAGNOSIS — I6522 Occlusion and stenosis of left carotid artery: Secondary | ICD-10-CM | POA: Diagnosis not present

## 2021-10-10 DIAGNOSIS — I959 Hypotension, unspecified: Secondary | ICD-10-CM | POA: Diagnosis not present

## 2021-10-10 DIAGNOSIS — Z953 Presence of xenogenic heart valve: Secondary | ICD-10-CM

## 2021-10-10 DIAGNOSIS — Y92000 Kitchen of unspecified non-institutional (private) residence as  the place of occurrence of the external cause: Secondary | ICD-10-CM

## 2021-10-10 DIAGNOSIS — M6258 Muscle wasting and atrophy, not elsewhere classified, other site: Secondary | ICD-10-CM | POA: Diagnosis not present

## 2021-10-10 DIAGNOSIS — I2510 Atherosclerotic heart disease of native coronary artery without angina pectoris: Secondary | ICD-10-CM | POA: Diagnosis present

## 2021-10-10 DIAGNOSIS — N400 Enlarged prostate without lower urinary tract symptoms: Secondary | ICD-10-CM

## 2021-10-10 DIAGNOSIS — Z7901 Long term (current) use of anticoagulants: Secondary | ICD-10-CM

## 2021-10-10 DIAGNOSIS — N3000 Acute cystitis without hematuria: Secondary | ICD-10-CM | POA: Diagnosis present

## 2021-10-10 DIAGNOSIS — Z743 Need for continuous supervision: Secondary | ICD-10-CM | POA: Diagnosis not present

## 2021-10-10 DIAGNOSIS — Z79899 Other long term (current) drug therapy: Secondary | ICD-10-CM

## 2021-10-10 DIAGNOSIS — I5043 Acute on chronic combined systolic (congestive) and diastolic (congestive) heart failure: Secondary | ICD-10-CM

## 2021-10-10 DIAGNOSIS — R791 Abnormal coagulation profile: Secondary | ICD-10-CM | POA: Diagnosis present

## 2021-10-10 DIAGNOSIS — Z888 Allergy status to other drugs, medicaments and biological substances status: Secondary | ICD-10-CM | POA: Diagnosis not present

## 2021-10-10 DIAGNOSIS — W19XXXA Unspecified fall, initial encounter: Secondary | ICD-10-CM | POA: Diagnosis present

## 2021-10-10 DIAGNOSIS — B9620 Unspecified Escherichia coli [E. coli] as the cause of diseases classified elsewhere: Secondary | ICD-10-CM | POA: Diagnosis present

## 2021-10-10 DIAGNOSIS — Z8679 Personal history of other diseases of the circulatory system: Secondary | ICD-10-CM

## 2021-10-10 DIAGNOSIS — R339 Retention of urine, unspecified: Secondary | ICD-10-CM

## 2021-10-10 DIAGNOSIS — M109 Gout, unspecified: Secondary | ICD-10-CM | POA: Diagnosis present

## 2021-10-10 DIAGNOSIS — I10 Essential (primary) hypertension: Secondary | ICD-10-CM | POA: Diagnosis present

## 2021-10-10 DIAGNOSIS — I447 Left bundle-branch block, unspecified: Secondary | ICD-10-CM | POA: Diagnosis present

## 2021-10-10 DIAGNOSIS — I480 Paroxysmal atrial fibrillation: Secondary | ICD-10-CM | POA: Diagnosis not present

## 2021-10-10 DIAGNOSIS — M6282 Rhabdomyolysis: Secondary | ICD-10-CM | POA: Diagnosis not present

## 2021-10-10 DIAGNOSIS — Z8249 Family history of ischemic heart disease and other diseases of the circulatory system: Secondary | ICD-10-CM

## 2021-10-10 DIAGNOSIS — T796XXA Traumatic ischemia of muscle, initial encounter: Secondary | ICD-10-CM | POA: Diagnosis not present

## 2021-10-10 DIAGNOSIS — I5033 Acute on chronic diastolic (congestive) heart failure: Secondary | ICD-10-CM | POA: Diagnosis not present

## 2021-10-10 DIAGNOSIS — I493 Ventricular premature depolarization: Secondary | ICD-10-CM | POA: Diagnosis present

## 2021-10-10 DIAGNOSIS — Z7984 Long term (current) use of oral hypoglycemic drugs: Secondary | ICD-10-CM

## 2021-10-10 DIAGNOSIS — L8990 Pressure ulcer of unspecified site, unspecified stage: Secondary | ICD-10-CM | POA: Insufficient documentation

## 2021-10-10 DIAGNOSIS — R2689 Other abnormalities of gait and mobility: Secondary | ICD-10-CM | POA: Diagnosis not present

## 2021-10-10 DIAGNOSIS — Z86010 Personal history of colonic polyps: Secondary | ICD-10-CM

## 2021-10-10 DIAGNOSIS — M6281 Muscle weakness (generalized): Secondary | ICD-10-CM | POA: Diagnosis not present

## 2021-10-10 DIAGNOSIS — I5032 Chronic diastolic (congestive) heart failure: Secondary | ICD-10-CM | POA: Diagnosis not present

## 2021-10-10 LAB — URINALYSIS, ROUTINE W REFLEX MICROSCOPIC
Bilirubin Urine: NEGATIVE
Glucose, UA: >1000 mg/dL — AB
Ketones, ur: NEGATIVE mg/dL
Nitrite: NEGATIVE
Protein, ur: 30 mg/dL — AB
Specific Gravity, Urine: 1.012 (ref 1.005–1.030)
WBC, UA: >50 WBC/hpf — ABNORMAL HIGH (ref 0–5)
pH: 5.5 (ref 5.0–8.0)

## 2021-10-10 LAB — CBC WITH DIFFERENTIAL/PLATELET
Abs Immature Granulocytes: 0.08 10*3/uL — ABNORMAL HIGH (ref 0.00–0.07)
Basophils Absolute: 0.1 10*3/uL (ref 0.0–0.1)
Basophils Relative: 0 %
Eosinophils Absolute: 0 10*3/uL (ref 0.0–0.5)
Eosinophils Relative: 0 %
HCT: 45.9 % (ref 39.0–52.0)
Hemoglobin: 15.2 g/dL (ref 13.0–17.0)
Immature Granulocytes: 1 %
Lymphocytes Relative: 6 %
Lymphs Abs: 1 10*3/uL (ref 0.7–4.0)
MCH: 30.4 pg (ref 26.0–34.0)
MCHC: 33.1 g/dL (ref 30.0–36.0)
MCV: 91.8 fL (ref 80.0–100.0)
Monocytes Absolute: 1.1 10*3/uL — ABNORMAL HIGH (ref 0.1–1.0)
Monocytes Relative: 6 %
Neutro Abs: 15.2 10*3/uL — ABNORMAL HIGH (ref 1.7–7.7)
Neutrophils Relative %: 87 %
Platelets: 162 10*3/uL (ref 150–400)
RBC: 5 MIL/uL (ref 4.22–5.81)
RDW: 13.1 % (ref 11.5–15.5)
WBC: 17.5 10*3/uL — ABNORMAL HIGH (ref 4.0–10.5)
nRBC: 0 % (ref 0.0–0.2)

## 2021-10-10 LAB — COMPREHENSIVE METABOLIC PANEL
ALT: 56 U/L — ABNORMAL HIGH (ref 0–44)
AST: 265 U/L — ABNORMAL HIGH (ref 15–41)
Albumin: 4 g/dL (ref 3.5–5.0)
Alkaline Phosphatase: 58 U/L (ref 38–126)
Anion gap: 8 (ref 5–15)
BUN: 15 mg/dL (ref 8–23)
CO2: 28 mmol/L (ref 22–32)
Calcium: 9.1 mg/dL (ref 8.9–10.3)
Chloride: 99 mmol/L (ref 98–111)
Creatinine, Ser: 1.09 mg/dL (ref 0.61–1.24)
GFR, Estimated: >60 mL/min (ref >60)
Glucose, Bld: 128 mg/dL — ABNORMAL HIGH (ref 70–99)
Potassium: 4.1 mmol/L (ref 3.5–5.1)
Sodium: 135 mmol/L (ref 135–145)
Total Bilirubin: 2.6 mg/dL — ABNORMAL HIGH (ref 0.3–1.2)
Total Protein: 6.9 g/dL (ref 6.5–8.1)

## 2021-10-10 LAB — PROTIME-INR
INR: 1.6 — ABNORMAL HIGH (ref 0.8–1.2)
Prothrombin Time: 19.4 seconds — ABNORMAL HIGH (ref 11.4–15.2)

## 2021-10-10 LAB — CK: Total CK: 12793 U/L — ABNORMAL HIGH (ref 49–397)

## 2021-10-10 MED ORDER — LACTATED RINGERS IV SOLN: INTRAVENOUS | Status: DC

## 2021-10-10 MED ORDER — SODIUM BICARBONATE 8.4 % IV SOLN
INTRAVENOUS | Status: DC
  Filled 2021-10-10: qty 1000

## 2021-10-10 MED ORDER — SODIUM CHLORIDE 0.9 % IV BOLUS
500.0000 mL | Freq: Once | INTRAVENOUS | Status: AC
  Administered 2021-10-10: 500 mL via INTRAVENOUS

## 2021-10-10 MED ORDER — LIDOCAINE HCL URETHRAL/MUCOSAL 2 % EX GEL
1.0000 "application " | Freq: Once | CUTANEOUS | Status: AC
  Administered 2021-10-10: 1 via URETHRAL
  Filled 2021-10-10: qty 11

## 2021-10-10 MED ORDER — STERILE WATER FOR INJECTION IV SOLN: INTRAVENOUS | Status: DC

## 2021-10-10 MED ORDER — SODIUM CHLORIDE 0.9 % IV SOLN
1.0000 g | Freq: Once | INTRAVENOUS | Status: AC
  Administered 2021-10-10: 1 g via INTRAVENOUS
  Filled 2021-10-10: qty 10

## 2021-10-10 MED ORDER — SODIUM CHLORIDE 0.9 % IV BOLUS: 1000.0000 mL | Freq: Once | INTRAVENOUS | Status: DC

## 2021-10-10 NOTE — ED Triage Notes (Signed)
Pt is present for urinary retention that started on Thursday evening. Pt states, "I am having a hard time starting a stream and keeping a stream going". Hx of urinary retention. Pt last voided five minutes ago but does not feel like he completely emptied his bladder. Also concerned for UTI due to some weakness.

## 2021-10-10 NOTE — Plan of Care (Addendum)
87 yo M with h/o CHF.  Pt with UTI, urinary retention, fall on ground for 6h.  Now has rhabdo.  H/o CHF, high risk of decompensation given high IVF rate needed to treat rhabdo.  Will put in for med-surg bed.  [facility] will assume care on arrival to accepting facility. Until arrival, care as per EDP. However, [facility] available 24/7 for questions and assistance.  Nursing staff, please page [place] and Consults [phone]) as soon as the patient arrives the hospital.

## 2021-10-10 NOTE — ED Provider Notes (Signed)
[facility] EMERGENCY DEPT Provider Note   CSN: [id number] Arrival date & time: 10/10/21  1854     History Chief Complaint  Patient presents with   Urinary Retention    Nathaniel [name]. is a 87 y.o. male.  Pt presents to the ED today with urinary retention.  Pt said it started on Thursday evening (10/27).  Pt said he urinates just a little bit, but does not feel like he has completely emptied his bladder.  Pt's son also said pt fell this morning and was on the ground for at least 6 hours.  Pt is on coumadin for a hx of afib, but denies any head injury.  He denies any pain other than in his bladder.  Pt did not tell me about the fall.  The son had to take me aside outside the room to tell me.  Pt finally admits he fell.  Pt said he kept trying to pull himself up, but was unable to get up.  Pt lives alone.  His niece found him on the ground.      Past Medical History:  Diagnosis Date   AAA (abdominal aortic aneurysm)    Atrial fibrillation (HCC)    Atrial fibrillation (HCC)    CAD (coronary artery disease)    Cataract    CHF (congestive heart failure) (HCC)    Chronic kidney disease    Colon polyps    adenomatous   Diabetes mellitus    Diverticulosis    Dysrhythmia    A-Fib   GERD (gastroesophageal reflux disease)    Gout    Hernia, abdominal    History of aortic valve replacement with bioprosthetic valve    2007   Hx: UTI (urinary tract infection)    Hyperlipemia    Hypertension    Internal hemorrhoids    Peripheral vascular disease ([place])    Shortness of breath    with exertion    Patient Active Problem List   Diagnosis Date Noted   Rhabdomyolysis 10/10/2021   Afib ([place]) 06/12/2019   Asymptomatic carotid artery stenosis without infarction, right 02/02/2018   Lung infiltrate 01/06/2018   Acute bronchiolitis due to human metapneumovirus 01/06/2018   Acute on chronic combined systolic and diastolic CHF (congestive heart failure) (HCC)    Cough  12/19/2017   Acute bronchitis 12/19/2017   Solitary pulmonary nodule 03/23/2016   Pulmonary nodule 12/25/2015   Aspiration of foreign body    Lung nodule < 6cm on CT    Foreign body aspiration 12/23/2015   Encounter for therapeutic drug monitoring 08/67/6195   Chronic systolic CHF (congestive heart failure) ([place]) 06/12/2014   Chronic diastolic CHF (congestive heart failure) ([place]) 04/23/2014   Atrial fibrillation with rapid ventricular response ([place]) 04/20/2014   AAA (abdominal aortic aneurysm) without rupture 04/13/2014   Occlusion and stenosis of carotid artery without mention of cerebral infarction 04/09/2014   CAD (coronary artery disease) of artery bypass graft 09/14/2012   Abdominal aortic aneurysm ([place]) 09/14/2012   Peripheral vascular disease with claudication ([place]) 09/14/2012   Carotid artery disease without cerebral infarction ([place]) 09/14/2012   History of aortic valve replacement with bioprosthetic valve 09/14/2012   Essential hypertension, malignant 09/14/2012   Hyperlipidemia with target LDL less than 70 09/14/2012   ABDOMINAL AORTIC ANEURYSM 07/06/2009   CEREBROVASCULAR DISEASE, HX OF 07/06/2009    Past Surgical History:  Procedure Laterality Date   ABDOMINAL AORTIC ENDOVASCULAR STENT GRAFT N/A 04/13/2014   Procedure: ABDOMINAL AORTIC ENDOVASCULAR STENT GRAFT;  Surgeon: Serafina Mitchell, MD;  Location: [facility];  Service: Vascular;  Laterality: N/A;   ANGIOPLASTY     Unsure if he had stents put in    [place] N/A 06/06/2014   Procedure: CARDIOVERSION;  Surgeon: Larey Dresser, MD;  Location: [facility];  Service: Cardiovascular;  Laterality: N/A;   CARDIOVERSION N/A 08/21/2014   Procedure: CARDIOVERSION;  Surgeon: Larey Dresser, MD;  Location: [facility];  Service: Cardiovascular;  Laterality: N/A;   CARDIOVERSION N/A 12/22/2017   Procedure: CARDIOVERSION;  Surgeon: Larey Dresser, MD;  Location: [facility] ENDOSCOPY;  Service:  Cardiovascular;  Laterality: N/A;   CARDIOVERSION N/A 05/25/2019   Procedure: CARDIOVERSION;  Surgeon: Larey Dresser, MD;  Location: [place];  Service: Cardiovascular;  Laterality: N/A;   CARDIOVERSION N/A 06/14/2019   Procedure: CARDIOVERSION;  Surgeon: Jerline Pain, MD;  Location: [place];  Service: Cardiovascular;  Laterality: N/A;   CARDIOVERSION N/A 12/12/2020   Procedure: CARDIOVERSION;  Surgeon: Larey Dresser, MD;  Location: [facility] ENDOSCOPY;  Service: Cardiovascular;  Laterality: N/A;   CARDIOVERSION N/A 01/29/2021   Procedure: CARDIOVERSION;  Surgeon: Larey Dresser, MD;  Location: [place];  Service: Cardiovascular;  Laterality: N/A;   CORONARY ARTERY BYPASS GRAFT     ENDARTERECTOMY Right 02/02/2018   Procedure: ENDARTERECTOMY CAROTID RIGHT;  Surgeon: Serafina Mitchell, MD;  Location: [facility] OR;  Service: Vascular;  Laterality: Right;   EYE SURGERY Bilateral    cataract surgery   HERNIA REPAIR     2 operations   LEFT HEART CATHETERIZATION WITH CORONARY/GRAFT ANGIOGRAM N/A 07/10/2014   Procedure: LEFT HEART CATHETERIZATION WITH Beatrix Fetters;  Surgeon: Larey Dresser, MD;  Location: [facility] CATH LAB;  Service: Cardiovascular;  Laterality: N/A;   TEE WITHOUT CARDIOVERSION N/A 05/25/2019   Procedure: TRANSESOPHAGEAL ECHOCARDIOGRAM (TEE);  Surgeon: Larey Dresser, MD;  Location: [facility] ENDOSCOPY;  Service: Cardiovascular;  Laterality: N/A;   TEE WITHOUT CARDIOVERSION N/A 01/29/2021   Procedure: TRANSESOPHAGEAL ECHOCARDIOGRAM (TEE);  Surgeon: Larey Dresser, MD;  Location: [facility] ENDOSCOPY;  Service: Cardiovascular;  Laterality: N/A;   TONSILLECTOMY     VIDEO BRONCHOSCOPY Bilateral 12/25/2015   Procedure: VIDEO BRONCHOSCOPY WITHOUT FLUORO;  Surgeon: Javier Glazier, MD;  Location: [place];  Service: Cardiopulmonary;  Laterality: Bilateral;       Family History  Problem Relation Age of Onset   Heart disease Mother    Heart disease Father        before age 80    AAA (abdominal aortic aneurysm) Father    Colon cancer Neg Hx     Social History   Tobacco Use   Smoking status: Former    Packs/day: 0.25    Years: 20.00    Pack years: 5.00    Types: Cigarettes    Quit date: 03/23/1992    Years since quitting: 29.5   Smokeless tobacco: Never  Vaping Use   Vaping Use: Never used  Substance Use Topics   Alcohol use: Yes    Alcohol/week: 10.0 standard drinks    Types: 10 Glasses of wine per week    Comment: 1-2 drinks daily    Drug use: No    Home Medications Prior to Admission medications   Medication Sig Start Date End Date Taking? Authorizing Provider  allopurinol (ZYLOPRIM) 300 MG tablet Take 300 mg by mouth daily.  05/13/17   [provider]  atorvastatin (LIPITOR) 10 MG tablet TAKE ONE  TABLET DAILY 05/13/21   Larey Dresser, MD  atorvastatin (LIPITOR) 20 MG tablet Take 20 mg by mouth daily.    [provider]  Coenzyme Q10 (CO Q10) 200 MG CAPS Take 200 mg by mouth daily.    [provider]  dofetilide (TIKOSYN) 250 MCG capsule TAKE ONE CAPSULE TWICE A DAY 04/15/21   Larey Dresser, MD  empagliflozin (JARDIANCE) 10 MG TABS tablet Take 10 mg by mouth daily. 07/14/18   [provider]  Evolocumab (REPATHA SURECLICK) 749 MG/ML SOAJ Inject 1 pen into the skin every 14 (fourteen) days. 09/11/20   Larey Dresser, MD  ezetimibe (ZETIA) 10 MG tablet Take 1 tablet (10 mg total) by mouth at bedtime. 01/01/21   Larey Dresser, MD  furosemide (LASIX) 20 MG tablet Take 20-40 mg by mouth as directed. Rotating every other day; 20mg  one day and 40mg  the next day    [provider]  latanoprost (XALATAN) 0.005 % ophthalmic solution Place 1 drop into both eyes daily.    [provider]  Metoprolol Succinate 25 MG CS24 Take 25 mg by mouth 2 (two) times daily.    [provider]  Misc Natural Products (GLUCOSAMINE CHONDROITIN TRIPLE) TABS Take 1 tablet by mouth daily.    [provider]   ramipril (ALTACE) 2.5 MG capsule Take 1 capsule (2.5 mg total) by mouth 2 (two) times daily. 02/25/21   Sherran Needs, NP  spironolactone (ALDACTONE) 25 MG tablet TAKE ONE TABLET DAILY 11/18/20   Larey Dresser, MD  tamsulosin (FLOMAX) 0.4 MG CAPS capsule Take 0.4 mg by mouth 2 (two) times daily.    [provider]  timolol (TIMOPTIC) 0.5 % ophthalmic solution Place 1 drop into both eyes at bedtime. 05/13/17   [provider]  warfarin (COUMADIN) 3 MG tablet TAKE 3 TABLETS DAILY EXCEPT 2 TABLETS ONMONDAY AND FRIDAY OR AS DIRECTED BY CLINIC 09/19/21   Larey Dresser, MD    Allergies    Grass extracts [gramineae pollens], Milk-related compounds, and Amiodarone  Review of Systems   Review of Systems  Genitourinary:  Positive for difficulty urinating.  All other systems reviewed and are negative.  Physical Exam Updated Vital Signs BP (!) 154/64   Pulse (!) 102   Temp 98.9 F (37.2 C) (Oral)   Resp 20   Ht 5\' 10"  (1.778 m)   Wt 89.4 kg   SpO2 100%   BMI 28.27 kg/m   Physical Exam Vitals and nursing note reviewed.  Constitutional:      Appearance: Normal appearance.  HENT:     Head: Normocephalic and atraumatic.     Right Ear: External ear normal.     Left Ear: External ear normal.     Nose: Nose normal.     Mouth/Throat:     Mouth: Mucous membranes are moist.     Pharynx: Oropharynx is clear.  Eyes:     Extraocular Movements: Extraocular movements intact.     Conjunctiva/sclera: Conjunctivae normal.     Pupils: Pupils are equal, round, and reactive to light.  Cardiovascular:     Rate and Rhythm: Tachycardia present. Rhythm irregular.     Pulses: Normal pulses.     Heart sounds: Normal heart sounds.  Pulmonary:     Effort: Pulmonary effort is normal.     Breath sounds: Normal breath sounds.  Abdominal:     General: Abdomen is flat. Bowel sounds are normal.     Palpations: Abdomen  is soft.     Tenderness: There is abdominal tenderness in the  suprapubic area.  Musculoskeletal:        General: Normal range of motion.     Cervical back: Normal range of motion and neck supple.  Skin:    General: Skin is warm.     Capillary Refill: Capillary refill takes less than 2 seconds.  Neurological:     General: No focal deficit present.     Mental Status: He is alert and oriented to person, place, and time.  Psychiatric:        Mood and Affect: Mood normal.        Behavior: Behavior normal.    ED Results / Procedures / Treatments   Labs (all labs ordered are listed, but only abnormal results are displayed) Labs Reviewed  URINALYSIS, ROUTINE W REFLEX MICROSCOPIC - Abnormal; Notable for the following components:      Result Value   APPearance HAZY (*)    Glucose, UA >1,000 (*)    Hgb urine dipstick LARGE (*)    Protein, ur 30 (*)    Leukocytes,Ua MODERATE (*)    WBC, UA >50 (*)    Bacteria, UA MANY (*)    All other components within normal limits  CBC WITH DIFFERENTIAL/PLATELET - Abnormal; Notable for the following components:   WBC 17.5 (*)    Neutro Abs 15.2 (*)    Monocytes Absolute 1.1 (*)    Abs Immature Granulocytes 0.08 (*)    All other components within normal limits  COMPREHENSIVE METABOLIC PANEL - Abnormal; Notable for the following components:   Glucose, Bld 128 (*)    AST 265 (*)    ALT 56 (*)    Total Bilirubin 2.6 (*)    All other components within normal limits  CK - Abnormal; Notable for the following components:   Total CK 12,793 (*)    All other components within normal limits  PROTIME-INR - Abnormal; Notable for the following components:   Prothrombin Time 19.4 (*)    INR 1.6 (*)    All other components within normal limits  URINE CULTURE  RESP PANEL BY RT-PCR (FLU A&B, COVID) [id number]  BASIC METABOLIC PANEL  CK    EKG None  Radiology No results found.  Procedures Procedures   Medications Ordered in ED Medications  lactated ringers infusion (has no administration in time range)   lidocaine (XYLOCAINE) 2 % jelly 1 application (1 application Urethral Given 10/10/21 2100)  sodium chloride 0.9 % bolus 500 mL (500 mLs Intravenous New Bag/Given 10/10/21 2144)  cefTRIAXone (ROCEPHIN) 1 g in sodium chloride 0.9 % 100 mL IVPB (1 g Intravenous New Bag/Given 10/10/21 2216)    ED Course  I have reviewed the triage vital signs and the nursing notes.  Pertinent labs & imaging results that were available during my care of the patient were reviewed by me and considered in my medical decision making (see chart for details).    MDM Rules/Calculators/A&P                           Pt denies any trauma from the fall.  He did not hit his head.   A foley was placed and he is feeling much more comfortable.  Pt does have a uti and is given a dose of rocephin.  He is in rhabdo.  Bicarb drip started.  We don't have sterile water here at [facility], so it was  started with D5 per pharmacy recommendation.  However, hospitalist wants to hold on the bicarb, so he did not get the bicarb as of yet.  Pt d/w Dr. Alcario Drought (triad) for admission.   Final Clinical Impression(s) / ED Diagnoses Final diagnoses:  Urinary retention  Acute cystitis without hematuria  Traumatic rhabdomyolysis, initial encounter ([place])  Anticoagulated on Coumadin    Rx / DC Orders ED Discharge Orders     None        Isla Pence, MD 10/10/21 2246

## 2021-10-11 ENCOUNTER — Encounter (HOSPITAL_COMMUNITY): Payer: Self-pay | Admitting: Internal Medicine

## 2021-10-11 DIAGNOSIS — W19XXXA Unspecified fall, initial encounter: Secondary | ICD-10-CM | POA: Diagnosis present

## 2021-10-11 DIAGNOSIS — G4733 Obstructive sleep apnea (adult) (pediatric): Secondary | ICD-10-CM | POA: Diagnosis present

## 2021-10-11 DIAGNOSIS — M6281 Muscle weakness (generalized): Secondary | ICD-10-CM | POA: Diagnosis not present

## 2021-10-11 DIAGNOSIS — N3000 Acute cystitis without hematuria: Secondary | ICD-10-CM

## 2021-10-11 DIAGNOSIS — B9620 Unspecified Escherichia coli [E. coli] as the cause of diseases classified elsewhere: Secondary | ICD-10-CM | POA: Diagnosis present

## 2021-10-11 DIAGNOSIS — Z9109 Other allergy status, other than to drugs and biological substances: Secondary | ICD-10-CM | POA: Diagnosis not present

## 2021-10-11 DIAGNOSIS — N182 Chronic kidney disease, stage 2 (mild): Secondary | ICD-10-CM | POA: Diagnosis present

## 2021-10-11 DIAGNOSIS — Z91011 Allergy to milk products: Secondary | ICD-10-CM | POA: Diagnosis not present

## 2021-10-11 DIAGNOSIS — I5032 Chronic diastolic (congestive) heart failure: Secondary | ICD-10-CM | POA: Diagnosis not present

## 2021-10-11 DIAGNOSIS — L8990 Pressure ulcer of unspecified site, unspecified stage: Secondary | ICD-10-CM | POA: Insufficient documentation

## 2021-10-11 DIAGNOSIS — Z888 Allergy status to other drugs, medicaments and biological substances status: Secondary | ICD-10-CM | POA: Diagnosis not present

## 2021-10-11 DIAGNOSIS — I471 Supraventricular tachycardia: Secondary | ICD-10-CM | POA: Diagnosis present

## 2021-10-11 DIAGNOSIS — I130 Hypertensive heart and chronic kidney disease with heart failure and stage 1 through stage 4 chronic kidney disease, or unspecified chronic kidney disease: Secondary | ICD-10-CM | POA: Diagnosis present

## 2021-10-11 DIAGNOSIS — I4819 Other persistent atrial fibrillation: Secondary | ICD-10-CM | POA: Diagnosis not present

## 2021-10-11 DIAGNOSIS — M6282 Rhabdomyolysis: Secondary | ICD-10-CM | POA: Diagnosis present

## 2021-10-11 DIAGNOSIS — Z743 Need for continuous supervision: Secondary | ICD-10-CM | POA: Diagnosis not present

## 2021-10-11 DIAGNOSIS — T796XXA Traumatic ischemia of muscle, initial encounter: Principal | ICD-10-CM

## 2021-10-11 DIAGNOSIS — I10 Essential (primary) hypertension: Secondary | ICD-10-CM

## 2021-10-11 DIAGNOSIS — L89891 Pressure ulcer of other site, stage 1: Secondary | ICD-10-CM | POA: Diagnosis present

## 2021-10-11 DIAGNOSIS — Z20822 Contact with and (suspected) exposure to covid-19: Secondary | ICD-10-CM | POA: Diagnosis present

## 2021-10-11 DIAGNOSIS — M6258 Muscle wasting and atrophy, not elsewhere classified, other site: Secondary | ICD-10-CM | POA: Diagnosis not present

## 2021-10-11 DIAGNOSIS — I6522 Occlusion and stenosis of left carotid artery: Secondary | ICD-10-CM | POA: Diagnosis present

## 2021-10-11 DIAGNOSIS — R339 Retention of urine, unspecified: Secondary | ICD-10-CM | POA: Diagnosis not present

## 2021-10-11 DIAGNOSIS — E1122 Type 2 diabetes mellitus with diabetic chronic kidney disease: Secondary | ICD-10-CM | POA: Diagnosis present

## 2021-10-11 DIAGNOSIS — I2510 Atherosclerotic heart disease of native coronary artery without angina pectoris: Secondary | ICD-10-CM | POA: Diagnosis present

## 2021-10-11 DIAGNOSIS — I480 Paroxysmal atrial fibrillation: Secondary | ICD-10-CM

## 2021-10-11 DIAGNOSIS — Y92000 Kitchen of unspecified non-institutional (private) residence as  the place of occurrence of the external cause: Secondary | ICD-10-CM | POA: Diagnosis not present

## 2021-10-11 DIAGNOSIS — N400 Enlarged prostate without lower urinary tract symptoms: Secondary | ICD-10-CM

## 2021-10-11 DIAGNOSIS — R338 Other retention of urine: Secondary | ICD-10-CM | POA: Diagnosis present

## 2021-10-11 DIAGNOSIS — R2689 Other abnormalities of gait and mobility: Secondary | ICD-10-CM | POA: Diagnosis not present

## 2021-10-11 DIAGNOSIS — I5033 Acute on chronic diastolic (congestive) heart failure: Secondary | ICD-10-CM | POA: Diagnosis not present

## 2021-10-11 DIAGNOSIS — E785 Hyperlipidemia, unspecified: Secondary | ICD-10-CM | POA: Diagnosis present

## 2021-10-11 DIAGNOSIS — E1151 Type 2 diabetes mellitus with diabetic peripheral angiopathy without gangrene: Secondary | ICD-10-CM | POA: Diagnosis present

## 2021-10-11 DIAGNOSIS — I5042 Chronic combined systolic (congestive) and diastolic (congestive) heart failure: Secondary | ICD-10-CM | POA: Diagnosis present

## 2021-10-11 DIAGNOSIS — Z951 Presence of aortocoronary bypass graft: Secondary | ICD-10-CM | POA: Diagnosis not present

## 2021-10-11 DIAGNOSIS — N401 Enlarged prostate with lower urinary tract symptoms: Secondary | ICD-10-CM | POA: Diagnosis present

## 2021-10-11 DIAGNOSIS — I959 Hypotension, unspecified: Secondary | ICD-10-CM | POA: Diagnosis not present

## 2021-10-11 DIAGNOSIS — Z953 Presence of xenogenic heart valve: Secondary | ICD-10-CM | POA: Diagnosis not present

## 2021-10-11 DIAGNOSIS — R54 Age-related physical debility: Secondary | ICD-10-CM | POA: Diagnosis present

## 2021-10-11 LAB — CBC WITH DIFFERENTIAL/PLATELET
Abs Immature Granulocytes: 0.1 10*3/uL — ABNORMAL HIGH (ref 0.00–0.07)
Basophils Absolute: 0.1 10*3/uL (ref 0.0–0.1)
Basophils Relative: 0 %
Eosinophils Absolute: 0 10*3/uL (ref 0.0–0.5)
Eosinophils Relative: 0 %
HCT: 39.9 % (ref 39.0–52.0)
Hemoglobin: 13.5 g/dL (ref 13.0–17.0)
Immature Granulocytes: 1 %
Lymphocytes Relative: 9 %
Lymphs Abs: 1.5 10*3/uL (ref 0.7–4.0)
MCH: 31.2 pg (ref 26.0–34.0)
MCHC: 33.8 g/dL (ref 30.0–36.0)
MCV: 92.1 fL (ref 80.0–100.0)
Monocytes Absolute: 1.4 10*3/uL — ABNORMAL HIGH (ref 0.1–1.0)
Monocytes Relative: 9 %
Neutro Abs: 13 10*3/uL — ABNORMAL HIGH (ref 1.7–7.7)
Neutrophils Relative %: 81 %
Platelets: 147 10*3/uL — ABNORMAL LOW (ref 150–400)
RBC: 4.33 MIL/uL (ref 4.22–5.81)
RDW: 13.1 % (ref 11.5–15.5)
WBC: 16 10*3/uL — ABNORMAL HIGH (ref 4.0–10.5)
nRBC: 0 % (ref 0.0–0.2)

## 2021-10-11 LAB — BASIC METABOLIC PANEL
Anion gap: 7 (ref 5–15)
Anion gap: 6 (ref 5–15)
BUN: 13 mg/dL (ref 8–23)
BUN: 13 mg/dL (ref 8–23)
CO2: 25 mmol/L (ref 22–32)
CO2: 27 mmol/L (ref 22–32)
Calcium: 8.3 mg/dL — ABNORMAL LOW (ref 8.9–10.3)
Calcium: 8.2 mg/dL — ABNORMAL LOW (ref 8.9–10.3)
Chloride: 102 mmol/L (ref 98–111)
Chloride: 101 mmol/L (ref 98–111)
Creatinine, Ser: 1.07 mg/dL (ref 0.61–1.24)
Creatinine, Ser: 1.24 mg/dL (ref 0.61–1.24)
GFR, Estimated: >60 mL/min (ref >60)
GFR, Estimated: 56 mL/min — ABNORMAL LOW (ref >60)
Glucose, Bld: 127 mg/dL — ABNORMAL HIGH (ref 70–99)
Glucose, Bld: 146 mg/dL — ABNORMAL HIGH (ref 70–99)
Potassium: 4 mmol/L (ref 3.5–5.1)
Potassium: 4 mmol/L (ref 3.5–5.1)
Sodium: 134 mmol/L — ABNORMAL LOW (ref 135–145)
Sodium: 134 mmol/L — ABNORMAL LOW (ref 135–145)

## 2021-10-11 LAB — HEPATIC FUNCTION PANEL
ALT: 50 U/L — ABNORMAL HIGH (ref 0–44)
AST: 203 U/L — ABNORMAL HIGH (ref 15–41)
Albumin: 3 g/dL — ABNORMAL LOW (ref 3.5–5.0)
Alkaline Phosphatase: 58 U/L (ref 38–126)
Bilirubin, Direct: 0.3 mg/dL — ABNORMAL HIGH (ref 0.0–0.2)
Indirect Bilirubin: 2 mg/dL — ABNORMAL HIGH (ref 0.3–0.9)
Total Bilirubin: 2.3 mg/dL — ABNORMAL HIGH (ref 0.3–1.2)
Total Protein: 5.7 g/dL — ABNORMAL LOW (ref 6.5–8.1)

## 2021-10-11 LAB — GLUCOSE, CAPILLARY
Glucose-Capillary: 121 mg/dL — ABNORMAL HIGH (ref 70–99)
Glucose-Capillary: 120 mg/dL — ABNORMAL HIGH (ref 70–99)
Glucose-Capillary: 146 mg/dL — ABNORMAL HIGH (ref 70–99)
Glucose-Capillary: 156 mg/dL — ABNORMAL HIGH (ref 70–99)

## 2021-10-11 LAB — RESP PANEL BY RT-PCR (FLU A&B, COVID) ARPGX2
Influenza A by PCR: NEGATIVE
Influenza B by PCR: NEGATIVE
SARS Coronavirus 2 by RT PCR: NEGATIVE

## 2021-10-11 LAB — MAGNESIUM
Magnesium: 1.8 mg/dL (ref 1.7–2.4)
Magnesium: 2.4 mg/dL (ref 1.7–2.4)

## 2021-10-11 LAB — CK
Total CK: 6282 U/L — ABNORMAL HIGH (ref 49–397)
Total CK: 7754 U/L — ABNORMAL HIGH (ref 49–397)

## 2021-10-11 MED ORDER — RAMIPRIL 2.5 MG PO CAPS
2.5000 mg | ORAL_CAPSULE | Freq: Two times a day (BID) | ORAL | Status: DC
  Administered 2021-10-11: 2.5 mg via ORAL
  Filled 2021-10-11: qty 1

## 2021-10-11 MED ORDER — LACTATED RINGERS IV SOLN: INTRAVENOUS | Status: AC

## 2021-10-11 MED ORDER — EZETIMIBE 10 MG PO TABS
10.0000 mg | ORAL_TABLET | Freq: Every day | ORAL | Status: DC
  Administered 2021-10-11 – 2021-10-13 (×3): 10 mg via ORAL
  Filled 2021-10-11 (×3): qty 1

## 2021-10-11 MED ORDER — WARFARIN - PHARMACIST DOSING INPATIENT: Freq: Every day | Status: DC

## 2021-10-11 MED ORDER — MAGNESIUM SULFATE 2 GM/50ML IV SOLN
2.0000 g | Freq: Once | INTRAVENOUS | Status: AC
  Administered 2021-10-11: 2 g via INTRAVENOUS
  Filled 2021-10-11 (×2): qty 50

## 2021-10-11 MED ORDER — METOPROLOL SUCCINATE ER 25 MG PO TB24
25.0000 mg | ORAL_TABLET | Freq: Two times a day (BID) | ORAL | Status: DC
  Administered 2021-10-11 – 2021-10-13 (×5): 25 mg via ORAL
  Filled 2021-10-11 (×5): qty 1

## 2021-10-11 MED ORDER — SODIUM CHLORIDE 0.9% FLUSH
3.0000 mL | Freq: Two times a day (BID) | INTRAVENOUS | Status: DC
  Administered 2021-10-11 – 2021-10-14 (×4): 3 mL via INTRAVENOUS

## 2021-10-11 MED ORDER — TAMSULOSIN HCL 0.4 MG PO CAPS
0.4000 mg | ORAL_CAPSULE | Freq: Two times a day (BID) | ORAL | Status: DC
  Administered 2021-10-11 – 2021-10-14 (×8): 0.4 mg via ORAL
  Filled 2021-10-11 (×8): qty 1

## 2021-10-11 MED ORDER — DOFETILIDE 250 MCG PO CAPS: 250.0000 ug | ORAL_CAPSULE | Freq: Two times a day (BID) | ORAL | Status: DC

## 2021-10-11 MED ORDER — CEFTRIAXONE SODIUM 1 G IJ SOLR
1.0000 g | INTRAMUSCULAR | Status: DC
Start: 2021-10-11 — End: 2021-10-15
  Administered 2021-10-11 – 2021-10-13 (×3): 1 g via INTRAVENOUS
  Filled 2021-10-11 (×4): qty 10

## 2021-10-11 MED ORDER — SODIUM CHLORIDE 0.9% FLUSH: 3.0000 mL | INTRAVENOUS | Status: DC | PRN

## 2021-10-11 MED ORDER — SODIUM CHLORIDE 0.9 % IV SOLN: 250.0000 mL | INTRAVENOUS | Status: DC | PRN

## 2021-10-11 MED ORDER — CHLORHEXIDINE GLUCONATE CLOTH 2 % EX PADS
6.0000 | MEDICATED_PAD | Freq: Every day | CUTANEOUS | Status: DC
  Administered 2021-10-11 – 2021-10-13 (×3): 6 via TOPICAL

## 2021-10-11 MED ORDER — DOFETILIDE 250 MCG PO CAPS
250.0000 ug | ORAL_CAPSULE | Freq: Two times a day (BID) | ORAL | Status: DC
  Administered 2021-10-11 – 2021-10-14 (×7): 250 ug via ORAL
  Filled 2021-10-11 (×9): qty 1

## 2021-10-11 MED ORDER — SPIRONOLACTONE 25 MG PO TABS
25.0000 mg | ORAL_TABLET | Freq: Every day | ORAL | Status: DC
  Administered 2021-10-11: 25 mg via ORAL
  Filled 2021-10-11: qty 1

## 2021-10-11 MED ORDER — WARFARIN SODIUM 5 MG PO TABS
10.0000 mg | ORAL_TABLET | Freq: Once | ORAL | Status: AC
  Administered 2021-10-11: 10 mg via ORAL
  Filled 2021-10-11: qty 2

## 2021-10-11 MED ORDER — TIMOLOL MALEATE 0.5 % OP SOLN
1.0000 [drp] | Freq: Every day | OPHTHALMIC | Status: DC
  Administered 2021-10-11 – 2021-10-12 (×2): 1 [drp] via OPHTHALMIC
  Filled 2021-10-11 (×2): qty 5

## 2021-10-11 MED ORDER — LATANOPROST 0.005 % OP SOLN
1.0000 [drp] | Freq: Every day | OPHTHALMIC | Status: DC
  Administered 2021-10-11 – 2021-10-13 (×3): 1 [drp] via OPHTHALMIC
  Filled 2021-10-11 (×2): qty 2.5

## 2021-10-11 MED ORDER — INSULIN ASPART 100 UNIT/ML IJ SOLN
0.0000 [IU] | Freq: Three times a day (TID) | INTRAMUSCULAR | Status: DC
  Administered 2021-10-11 – 2021-10-12 (×3): 1 [IU] via SUBCUTANEOUS
  Administered 2021-10-14: 2 [IU] via SUBCUTANEOUS
  Administered 2021-10-14: 3 [IU] via SUBCUTANEOUS

## 2021-10-11 NOTE — Progress Notes (Addendum)
Cardiology Consultation:   Patient ID: Nathaniel [name]. MRN: [id number]; DOB: [birth year]   Admission date: 10/10/2021  PCP:  Lorne Skeens, MD   [facility] Providers Cardiologist:  None  Electrophysiologist:  Will Meredith Leeds, MD      Chief Complaint:  restart of Tikosyn in the setting of rhabdomyolysis  Patient Profile:   Nathaniel [name]. is a 87 y.o. male with with a history of CAD s/p CABG  and prior redo CABG with LIMA-LAD was patent, sequential SVG-OM/ramus/D patent to ramus SVG-PDA was patent with 50-60% mid-graft stenosis;  PAD, AAA s/p repair 2015, CAS and HFmrEF 45-50%.  HX of AF with 7 DCCV since 2015 and 3 since Tikosyn initiation in 2021.  He is being seen 10/11/2021 for the evaluation of atrial fibrillation.  History of Present Illness:   Nathaniel [name] notes that he is feeling he might have died after his fall this past 03/16/23.  Patient notes that he has not had any issues with atrial fibrillation since he stopped taking claritin D (and taking Tikoysn).  He notes that he has been having more issues with urination.  2023-03-16 PM, around 4:30 PM when to urinate and fell in his kitchen before he could reach the restroom. No aura, notse this was a mechanical fall where he caught his leg on something.  No LOC.  Took him 6 hours to get to a phone to call for help.  Has had no chest pain, chest pressure, chest tightness, chest stinging.   No shortness of breath, DOE .  No PND or orthopnea.  No bendopnea, weight gain, leg swelling , or abdominal swelling.  Principally, prior to his fall, he just noted weakness and lethargy that has been progressing.  No syncope or near syncope. He feels funny heart beats when they occurred (in AF).    In the ED patient received medications continuous IVF, Magnesium, patients succinate, ramipril, aldactone, and zetia.  Started ceftriaxone and a Foley Catheter.  Key labs notable for K of 4, normal creatinine, CK 12,793-> 6,282. UA with large  glucose moderate leuk, hazy, with many bacteria and WBCs.  Cardiology called for evaluation as he has missed four doses of his Tikosyn 250 mcg BID.  Has missed his warfarin and his INR was 1.6.    Past Medical History:  Diagnosis Date   AAA (abdominal aortic aneurysm)    Atrial fibrillation (HCC)    Atrial fibrillation (HCC)    CAD (coronary artery disease)    Cataract    CHF (congestive heart failure) (HCC)    Chronic kidney disease    Colon polyps    adenomatous   Diabetes mellitus    Diverticulosis    Dysrhythmia    A-Fib   GERD (gastroesophageal reflux disease)    Gout    Hernia, abdominal    History of aortic valve replacement with bioprosthetic valve    2007   Hx: UTI (urinary tract infection)    Hyperlipemia    Hypertension    Internal hemorrhoids    Peripheral vascular disease (HCC)    Shortness of breath    with exertion    Past Surgical History:  Procedure Laterality Date   ABDOMINAL AORTIC ENDOVASCULAR STENT GRAFT N/A 04/13/2014   Procedure: ABDOMINAL AORTIC ENDOVASCULAR STENT GRAFT;  Surgeon: Serafina Mitchell, MD;  Location: [facility] OR;  Service: Vascular;  Laterality: N/A;   ANGIOPLASTY     Unsure if he had stents put in    APPENDECTOMY  CARDIAC VALVE SURGERY     CARDIOVERSION N/A 06/06/2014   Procedure: CARDIOVERSION;  Surgeon: Larey Dresser, MD;  Location: [place];  Service: Cardiovascular;  Laterality: N/A;   CARDIOVERSION N/A 08/21/2014   Procedure: CARDIOVERSION;  Surgeon: Larey Dresser, MD;  Location: [place];  Service: Cardiovascular;  Laterality: N/A;   CARDIOVERSION N/A 12/22/2017   Procedure: CARDIOVERSION;  Surgeon: Larey Dresser, MD;  Location: [facility] ENDOSCOPY;  Service: Cardiovascular;  Laterality: N/A;   CARDIOVERSION N/A 05/25/2019   Procedure: CARDIOVERSION;  Surgeon: Larey Dresser, MD;  Location: [place];  Service: Cardiovascular;  Laterality: N/A;   CARDIOVERSION N/A 06/14/2019   Procedure: CARDIOVERSION;  Surgeon: Jerline Pain, MD;  Location: [facility] ENDOSCOPY;  Service: Cardiovascular;  Laterality: N/A;   CARDIOVERSION N/A 12/12/2020   Procedure: CARDIOVERSION;  Surgeon: Larey Dresser, MD;  Location: [facility] ENDOSCOPY;  Service: Cardiovascular;  Laterality: N/A;   CARDIOVERSION N/A 01/29/2021   Procedure: CARDIOVERSION;  Surgeon: Larey Dresser, MD;  Location: [place];  Service: Cardiovascular;  Laterality: N/A;   CORONARY ARTERY BYPASS GRAFT     ENDARTERECTOMY Right 02/02/2018   Procedure: ENDARTERECTOMY CAROTID RIGHT;  Surgeon: Serafina Mitchell, MD;  Location[facility] OR;  Service: Vascular;  Laterality: Right;   EYE SURGERY Bilateral    cataract surgery   HERNIA REPAIR     2 operations   LEFT HEART CATHETERIZATION WITH CORONARY/GRAFT ANGIOGRAM N/A 07/10/2014   Procedure: LEFT HEART CATHETERIZATION WITH Beatrix Fetters;  Surgeon: Larey Dresser, MD;  Location: [facility] CATH LAB;  Service: Cardiovascular;  Laterality: N/A;   TEE WITHOUT CARDIOVERSION N/A 05/25/2019   Procedure: TRANSESOPHAGEAL ECHOCARDIOGRAM (TEE);  Surgeon: Larey Dresser, MD;  Location: [facility] ENDOSCOPY;  Service: Cardiovascular;  Laterality: N/A;   TEE WITHOUT CARDIOVERSION N/A 01/29/2021   Procedure: TRANSESOPHAGEAL ECHOCARDIOGRAM (TEE);  Surgeon: Larey Dresser, MD;  Location: [facility] ENDOSCOPY;  Service: Cardiovascular;  Laterality: N/A;   TONSILLECTOMY     VIDEO BRONCHOSCOPY Bilateral 12/25/2015   Procedure: VIDEO BRONCHOSCOPY WITHOUT FLUORO;  Surgeon: Javier Glazier, MD;  Location: [place];  Service: Cardiopulmonary;  Laterality: Bilateral;     Medications Prior to Admission: Prior to Admission medications   Medication Sig Start Date End Date Taking? Authorizing Provider  allopurinol (ZYLOPRIM) 300 MG tablet Take 300 mg by mouth daily.  05/13/17   [provider]  atorvastatin (LIPITOR) 10 MG tablet TAKE ONE TABLET DAILY 05/13/21   Larey Dresser, MD  atorvastatin (LIPITOR) 20 MG tablet Take 20 mg by mouth daily.     [provider]  Coenzyme Q10 (CO Q10) 200 MG CAPS Take 200 mg by mouth daily.    [provider]  dofetilide (TIKOSYN) 250 MCG capsule TAKE ONE CAPSULE TWICE A DAY 04/15/21   Larey Dresser, MD  empagliflozin (JARDIANCE) 10 MG TABS tablet Take 10 mg by mouth daily. 07/14/18   [provider]  Evolocumab (REPATHA SURECLICK) 315 MG/ML SOAJ Inject 1 pen into the skin every 14 (fourteen) days. 09/11/20   Larey Dresser, MD  ezetimibe (ZETIA) 10 MG tablet Take 1 tablet (10 mg total) by mouth at bedtime. 01/01/21   Larey Dresser, MD  furosemide (LASIX) 20 MG tablet Take 20-40 mg by mouth as directed. Rotating every other day; 20mg  one day and 40mg  the next day    [provider]  latanoprost (XALATAN) 0.005 % ophthalmic solution Place 1 drop into both eyes daily.    [provider]  Metoprolol  Succinate 25 MG CS24 Take 25 mg by mouth 2 (two) times daily.    [provider]  Misc Natural Products (GLUCOSAMINE CHONDROITIN TRIPLE) TABS Take 1 tablet by mouth daily.    [provider]  ramipril (ALTACE) 2.5 MG capsule Take 1 capsule (2.5 mg total) by mouth 2 (two) times daily. 02/25/21   Sherran Needs, NP  spironolactone (ALDACTONE) 25 MG tablet TAKE ONE TABLET DAILY 11/18/20   Larey Dresser, MD  tamsulosin (FLOMAX) 0.4 MG CAPS capsule Take 0.4 mg by mouth 2 (two) times daily.    [provider]  timolol (TIMOPTIC) 0.5 % ophthalmic solution Place 1 drop into both eyes at bedtime. 05/13/17   [provider]  warfarin (COUMADIN) 3 MG tablet TAKE 3 TABLETS DAILY EXCEPT 2 TABLETS ONMONDAY AND FRIDAY OR AS DIRECTED BY CLINIC 09/19/21   Larey Dresser, MD     Allergies:    Allergies  Allergen Reactions   Grass Extracts [Gramineae Pollens] Other (See Comments)    Sneezing and runny nose   Milk-Related Compounds Other (See Comments)    Just Yogurt.  Makes him very lethargic.   Amiodarone Anxiety    depression    Social  History:   Social History   Socioeconomic History   Marital status: Widowed    Spouse name: Not on file   Number of children: Not on file   Years of education: Not on file   Highest education level: Not on file  Occupational History   Occupation: [employer]   Tobacco Use   Smoking status: Former    Packs/day: 0.25    Years: 20.00    Pack years: 5.00    Types: Cigarettes    Quit date: 03/23/1992    Years since quitting: 29.5   Smokeless tobacco: Never  Vaping Use   Vaping Use: Never used  Substance and Sexual Activity   Alcohol use: Yes    Alcohol/week: 10.0 standard drinks    Types: 10 Glasses of wine per week    Comment: 1-2 drinks daily    Drug use: No   Sexual activity: Not Currently  Other Topics Concern   Not on file  Social History Narrative   1 caffeine drink daily    Social Determinants of Health   Financial Resource Strain: Not on file  Food Insecurity: Not on file  Transportation Needs: Not on file  Physical Activity: Not on file  Stress: Not on file  Social Connections: Not on file  Intimate Partner Violence: Not on file    Family History:   The patient's family history includes AAA (abdominal aortic aneurysm) in his father; Heart disease in his father and mother. There is no history of Colon cancer.    ROS:  Please see the history of present illness.  All other ROS reviewed and negative.     Physical Exam/Data:   Vitals:   10/10/21 2230 10/11/21 0023 10/11/21 0601 10/11/21 0953  BP: (!) 154/64 119/76 (!) 134/56 (!) 104/53  Pulse:  (!) 102 95 71  Resp:  18 17 (!) 24  Temp:   99.2 F (37.3 C) 99.8 F (37.7 C)  TempSrc:   Oral Oral  SpO2:  93% 93% 95%  Weight:      Height:        Intake/Output Summary (Last 24 hours) at 10/11/2021 1132 Last data filed at 10/11/2021 0600 Gross per 24 hour  Intake 970.16 ml  Output 500 ml  Net 470.16 ml  Last 3 Weights 10/10/2021 10/03/2021 07/01/2021  Weight (lbs) 197 lb 200 lb 12.8 oz 197 lb 9.6  oz  Weight (kg) 89.359 kg 91.082 kg 89.631 kg     Body mass index is 28.27 kg/m.  General:  Elderly male well developed, in no acute distress HEENT: normal Neck: no JVD Vascular: No carotid bruits; Distal pulses 2+ bilaterally   Cardiac:  normal S1, S2; regular bradycardia systolic crescendo murmur Lungs:  clear to auscultation bilaterally, no wheezing, rhonchi or rales  Abd: soft, nontender, no hepatomegaly  Ext: no edema Musculoskeletal:  No deformities, BUE and BLE strength normal and equal Skin: warm and dry  Neuro:  CNs 2-12 intact, no focal abnormalities noted Psych:  Normal affect    EKG:  ED ECG not on the floor; ECG has been reordered, last QTC 461 from 10/03/21.  Laboratory Data:  High Sensitivity Troponin:  No results for input(s): TROPONINIHS in the last 720 hours.    Chemistry Recent Labs  Lab 10/10/21 2128 10/11/21 0847  NA 135 134*  K 4.1 4.0  CL 99 102  CO2 28 25  GLUCOSE 128* 127*  BUN 15 13  CREATININE 1.09 1.07  CALCIUM 9.1 8.3*  GFRNONAA >60 >60  ANIONGAP 8 7    Recent Labs  Lab 10/10/21 2128 10/11/21 0847  PROT 6.9 5.7*  ALBUMIN 4.0 3.0*  AST 265* 203*  ALT 56* 50*  ALKPHOS 58 58  BILITOT 2.6* 2.3*   Lipids No results for input(s): CHOL, TRIG, HDL, LABVLDL, LDLCALC, CHOLHDL in the last 168 hours. Hematology Recent Labs  Lab 10/10/21 2128 10/11/21 0847  WBC 17.5* 16.0*  RBC 5.00 4.33  HGB 15.2 13.5  HCT 45.9 39.9  MCV 91.8 92.1  MCH 30.4 31.2  MCHC 33.1 33.8  RDW 13.1 13.1  PLT 162 147*   Thyroid No results for input(s): TSH, FREET4 in the last 168 hours. BNPNo results for input(s): BNP, PROBNP in the last 168 hours.  DDimer No results for input(s): DDIMER in the last 168 hours.   Radiology/Studies:  No results found.   Assessment and Plan:   Atrial Fibrillation CHADSVASC 6 on warfarin and subtherapeutic INR CAD, PAD, and HF - I have re-ordered a STAT ECG and we will get K and Mg this PM - If QTC 440 or less, and  stable labs, we will potentially Tikoysyn at home dose for this PM and repeat EKG 2 hours after per protocol - If QTc has increased, we will hold off and monitor as he receives IVF - warfarin is restarted - I have held off her ACI and MRA at this time    For questions or updates, please contact [place] Please consult [email or web address] for contact info under     Signed, Werner Lean, MD  10/11/2021 11:32 AM   ECG 444 and similar to prior (LBBB from iLBBB).  We have reordered his Tikosyn and have an ECG to follow.  Rudean Haskell, MD [id number], [place] [id number] [phone]  1:33 PM

## 2021-10-11 NOTE — Progress Notes (Signed)
Patient seen and examined.  87 year old gentleman with known history of coronary artery disease status post CABG, bioprosthetic aortic valve, paroxysmal atrial fibrillation on Tikosyn status post cardioversion, diabetes, peripheral vascular disease and BPH presented with fall at home, 6 hours on the floor.  He was found to have rhabdomyolysis with CK level of more than 12,000, WBC count 17.5.  Missed dose of Coumadin with INR 1.6 and missed doses of Tikosyn.  Clinically stabilizing.  Symptoms improved today.  Remains on IV fluids with already improving CK levels. Called and discussed case with cardiology service to resume missed doses of Tikosyn.  Telemetry ordered.  Sinus rhythm.  Started on Tikosyn with close monitoring with subsequent EKG and electrolyte replacement. Magnesium 1.8, 2 g of magnesium rider ordered. PT OT ordered.  Anticipate patient will need a skilled nursing facility for short-term rehab before returning home.  Patient with significant findings, needing be dosing of Tikosyn with frequent EKG and readjustment.  He will need to stay in the hospital as inpatient to monitor, treat high risk medications and is stabilized.  Total time spent: 30 minutes.  No charge visit.  Same-day admission.

## 2021-10-11 NOTE — Progress Notes (Signed)
ANTICOAGULATION CONSULT NOTE - Initial Consult  Pharmacy Consult for warfarin Indication: atrial fibrillation  Allergies  Allergen Reactions   Grass Extracts [Gramineae Pollens] Other (See Comments)    Sneezing and runny nose   Milk-Related Compounds Other (See Comments)    Just Yogurt.  Makes him very lethargic.   Amiodarone Anxiety    depression   Patient Measurements: Height: 5\' 10"  (177.8 cm) Weight: 89.4 kg (197 lb) IBW/kg (Calculated) : 73  Vital Signs: Temp: 99.2 F (37.3 C) (10/29 0601) Temp Source: Oral (10/29 0601) BP: 134/56 (10/29 0601) Pulse Rate: 95 (10/29 0601)  Labs: Recent Labs    10/09/21 0852 10/10/21 2128  HGB  --  15.2  HCT  --  45.9  PLT  --  162  LABPROT  --  19.4*  INR 2.6 1.6*  CREATININE  --  1.09  CKTOTAL  --  12,793*    Estimated Creatinine Clearance: 53.8 mL/min (by C-G formula based on SCr of 1.09 mg/dL).   Medical History: Past Medical History:  Diagnosis Date   AAA (abdominal aortic aneurysm)    Atrial fibrillation (HCC)    Atrial fibrillation (HCC)    CAD (coronary artery disease)    Cataract    CHF (congestive heart failure) (HCC)    Chronic kidney disease    Colon polyps    adenomatous   Diabetes mellitus    Diverticulosis    Dysrhythmia    A-Fib   GERD (gastroesophageal reflux disease)    Gout    Hernia, abdominal    History of aortic valve replacement with bioprosthetic valve    2007   Hx: UTI (urinary tract infection)    Hyperlipemia    Hypertension    Internal hemorrhoids    Peripheral vascular disease (HCC)    Shortness of breath    with exertion    Medications:  Medications Prior to Admission  Medication Sig Dispense Refill Last Dose   allopurinol (ZYLOPRIM) 300 MG tablet Take 300 mg by mouth daily.   2    atorvastatin (LIPITOR) 10 MG tablet TAKE ONE TABLET DAILY 30 tablet 11    atorvastatin (LIPITOR) 20 MG tablet Take 20 mg by mouth daily.      Coenzyme Q10 (CO Q10) 200 MG CAPS Take 200 mg by  mouth daily.      dofetilide (TIKOSYN) 250 MCG capsule TAKE ONE CAPSULE TWICE A DAY 60 capsule 11    empagliflozin (JARDIANCE) 10 MG TABS tablet Take 10 mg by mouth daily.      Evolocumab (REPATHA SURECLICK) 270 MG/ML SOAJ Inject 1 pen into the skin every 14 (fourteen) days. 2 mL 11    ezetimibe (ZETIA) 10 MG tablet Take 1 tablet (10 mg total) by mouth at bedtime. 30 tablet 3    furosemide (LASIX) 20 MG tablet Take 20-40 mg by mouth as directed. Rotating every other day; 20mg  one day and 40mg  the next day      latanoprost (XALATAN) 0.005 % ophthalmic solution Place 1 drop into both eyes daily.      Metoprolol Succinate 25 MG CS24 Take 25 mg by mouth 2 (two) times daily.      Misc Natural Products (GLUCOSAMINE CHONDROITIN TRIPLE) TABS Take 1 tablet by mouth daily.      ramipril (ALTACE) 2.5 MG capsule Take 1 capsule (2.5 mg total) by mouth 2 (two) times daily. 90 capsule 3    spironolactone (ALDACTONE) 25 MG tablet TAKE ONE TABLET DAILY 30 tablet 3  tamsulosin (FLOMAX) 0.4 MG CAPS capsule Take 0.4 mg by mouth 2 (two) times daily.      timolol (TIMOPTIC) 0.5 % ophthalmic solution Place 1 drop into both eyes at bedtime.  5    warfarin (COUMADIN) 3 MG tablet TAKE 3 TABLETS DAILY EXCEPT 2 TABLETS ONMONDAY AND FRIDAY OR AS DIRECTED BY CLINIC 90 tablet 2     Assessment: 48 yom with PMH significant for CAD s/p CABG, bioprosthetic aortic valve, afib, PVD, and DM. Patient presents s/p fall at home, no loss of consciousness or head injury. Patient on warfarin PTA.   PTA Dosing: 6MG  MF and 9MG  AOD (Weekly Total 57 MG)   Most recent INR 1.6 (subtherapeutic) and Hgb 15.2. No noted signs of bleed. Cards has been consulted as patient is on Tikosyn and has missed doses over the past two days. ECG shows patient in NSR with QTC 445. Will await cardiology input to restart tikosyn. Of note, patient reports poor diet recently.   Goal of Therapy:  INR 2-3 Monitor platelets by anticoagulation protocol: Yes    Plan:  Warfarin 10MG  x1 today  Daily INR and CBC  Monitor for s/sx of bleed  F/u cardiology recommendations   Cephus Slater, PharmD, MBA Pharmacy Resident [phone] 10/11/2021 7:56 AM

## 2021-10-11 NOTE — H&P (Addendum)
History and Physical    Nathaniel [name]. ZTI:[id number] DOB: 05/12/1932 DOA: 10/10/2021  PCP: Lorne Skeens, MD  Patient coming from: Home.  Chief Complaint: Weakness fall and difficulty urinating.  HPI: Nathaniel [name]. is a 87 y.o. male with known history of CAD status post CABG, bioprosthetic aortic valve, atrial fibrillation, peripheral vascular disease, diabetes mellitus states he has been finding it difficult to urinate over the last 2 days and also has been feeling weak more than usual with no chest pain or shortness of breath denies nausea vomiting or diarrhea.  He had a fall at home in his kitchen and took 6 hours before he could call for help.  Did not hit his head or lose consciousness.  ED Course: In the ER patient appears nonfocal and had to be placed on Foley cath after patient was found to have urinary retention.  UA was concerning for UTI was placed on antibiotics.  Patient's labs show CK level of 12,000 WBC of 17.5 COVID test negative EKG showing sinus bradycardia with heart rate around 59 bpm.  Patient was given fluid bolus for the rhabdomyolysis also was placed on ceftriaxone for possible UTI.  Patient admitted for further work-up.  INR was 1.6.  Review of Systems: As per HPI, rest all negative.   Past Medical History:  Diagnosis Date   AAA (abdominal aortic aneurysm)    Atrial fibrillation (HCC)    Atrial fibrillation (HCC)    CAD (coronary artery disease)    Cataract    CHF (congestive heart failure) (HCC)    Chronic kidney disease    Colon polyps    adenomatous   Diabetes mellitus    Diverticulosis    Dysrhythmia    A-Fib   GERD (gastroesophageal reflux disease)    Gout    Hernia, abdominal    History of aortic valve replacement with bioprosthetic valve    2007   Hx: UTI (urinary tract infection)    Hyperlipemia    Hypertension    Internal hemorrhoids    Peripheral vascular disease (HCC)    Shortness of breath    with exertion    Past Surgical  History:  Procedure Laterality Date   ABDOMINAL AORTIC ENDOVASCULAR STENT GRAFT N/A 04/13/2014   Procedure: ABDOMINAL AORTIC ENDOVASCULAR STENT GRAFT;  Surgeon: Serafina Mitchell, MD;  Location: [place];  Service: Vascular;  Laterality: N/A;   ANGIOPLASTY     Unsure if he had stents put in    [place] N/A 06/06/2014   Procedure: CARDIOVERSION;  Surgeon: Larey Dresser, MD;  Location: [place];  Service: Cardiovascular;  Laterality: N/A;   CARDIOVERSION N/A 08/21/2014   Procedure: CARDIOVERSION;  Surgeon: Larey Dresser, MD;  Location: [place];  Service: Cardiovascular;  Laterality: N/A;   CARDIOVERSION N/A 12/22/2017   Procedure: CARDIOVERSION;  Surgeon: Larey Dresser, MD;  Location: [place];  Service: Cardiovascular;  Laterality: N/A;   CARDIOVERSION N/A 05/25/2019   Procedure: CARDIOVERSION;  Surgeon: Larey Dresser, MD;  Location: [place];  Service: Cardiovascular;  Laterality: N/A;   CARDIOVERSION N/A 06/14/2019   Procedure: CARDIOVERSION;  Surgeon: Jerline Pain, MD;  Location: [facility] ENDOSCOPY;  Service: Cardiovascular;  Laterality: N/A;   CARDIOVERSION N/A 12/12/2020   Procedure: CARDIOVERSION;  Surgeon: Larey Dresser, MD;  Location: [place];  Service: Cardiovascular;  Laterality: N/A;   CARDIOVERSION N/A 01/29/2021   Procedure: CARDIOVERSION;  Surgeon: Aundra Dubin,  Elby Showers, MD;  Location: [place] ENDOSCOPY;  Service: Cardiovascular;  Laterality: N/A;   CORONARY ARTERY BYPASS GRAFT     ENDARTERECTOMY Right 02/02/2018   Procedure: ENDARTERECTOMY CAROTID RIGHT;  Surgeon: Serafina Mitchell, MD;  Location: [facility] OR;  Service: Vascular;  Laterality: Right;   EYE SURGERY Bilateral    cataract surgery   HERNIA REPAIR     2 operations   LEFT HEART CATHETERIZATION WITH CORONARY/GRAFT ANGIOGRAM N/A 07/10/2014   Procedure: LEFT HEART CATHETERIZATION WITH Beatrix Fetters;  Surgeon: Larey Dresser, MD;  Location: [facility] CATH LAB;   Service: Cardiovascular;  Laterality: N/A;   TEE WITHOUT CARDIOVERSION N/A 05/25/2019   Procedure: TRANSESOPHAGEAL ECHOCARDIOGRAM (TEE);  Surgeon: Larey Dresser, MD;  Location: [facility] ENDOSCOPY;  Service: Cardiovascular;  Laterality: N/A;   TEE WITHOUT CARDIOVERSION N/A 01/29/2021   Procedure: TRANSESOPHAGEAL ECHOCARDIOGRAM (TEE);  Surgeon: Larey Dresser, MD;  Location[facility] ENDOSCOPY;  Service: Cardiovascular;  Laterality: N/A;   TONSILLECTOMY     VIDEO BRONCHOSCOPY Bilateral 12/25/2015   Procedure: VIDEO BRONCHOSCOPY WITHOUT FLUORO;  Surgeon: Javier Glazier, MD;  Location: [place];  Service: Cardiopulmonary;  Laterality: Bilateral;     reports that he quit smoking about 29 years ago. His smoking use included cigarettes. He has a 5.00 pack-year smoking history. He has never used smokeless tobacco. He reports current alcohol use of about 10.0 standard drinks per week. He reports that he does not use drugs.  Allergies  Allergen Reactions   Grass Extracts [Gramineae Pollens] Other (See Comments)    Sneezing and runny nose   Milk-Related Compounds Other (See Comments)    Just Yogurt.  Makes him very lethargic.   Amiodarone Anxiety    depression    Family History  Problem Relation Age of Onset   Heart disease Mother    Heart disease Father        before age 23   AAA (abdominal aortic aneurysm) Father    Colon cancer Neg Hx     Prior to Admission medications   Medication Sig Start Date End Date Taking? Authorizing Provider  allopurinol (ZYLOPRIM) 300 MG tablet Take 300 mg by mouth daily.  05/13/17   [provider]  atorvastatin (LIPITOR) 10 MG tablet TAKE ONE TABLET DAILY 05/13/21   Larey Dresser, MD  atorvastatin (LIPITOR) 20 MG tablet Take 20 mg by mouth daily.    [provider]  Coenzyme Q10 (CO Q10) 200 MG CAPS Take 200 mg by mouth daily.    [provider]  dofetilide (TIKOSYN) 250 MCG capsule TAKE ONE CAPSULE TWICE A DAY 04/15/21   Larey Dresser, MD  empagliflozin (JARDIANCE) 10 MG TABS tablet Take 10 mg by mouth daily. 07/14/18   [provider]  Evolocumab (REPATHA SURECLICK) 062 MG/ML SOAJ Inject 1 pen into the skin every 14 (fourteen) days. 09/11/20   Larey Dresser, MD  ezetimibe (ZETIA) 10 MG tablet Take 1 tablet (10 mg total) by mouth at bedtime. 01/01/21   Larey Dresser, MD  furosemide (LASIX) 20 MG tablet Take 20-40 mg by mouth as directed. Rotating every other day; 20mg  one day and 40mg  the next day    [provider]  latanoprost (XALATAN) 0.005 % ophthalmic solution Place 1 drop into both eyes daily.    [provider]  Metoprolol Succinate 25 MG CS24 Take 25 mg by mouth 2 (two) times daily.    [provider]  Misc Natural Products (GLUCOSAMINE CHONDROITIN TRIPLE) TABS Take  1 tablet by mouth daily.    [provider]  ramipril (ALTACE) 2.5 MG capsule Take 1 capsule (2.5 mg total) by mouth 2 (two) times daily. 02/25/21   Sherran Needs, NP  spironolactone (ALDACTONE) 25 MG tablet TAKE ONE TABLET DAILY 11/18/20   Larey Dresser, MD  tamsulosin (FLOMAX) 0.4 MG CAPS capsule Take 0.4 mg by mouth 2 (two) times daily.    [provider]  timolol (TIMOPTIC) 0.5 % ophthalmic solution Place 1 drop into both eyes at bedtime. 05/13/17   [provider]  warfarin (COUMADIN) 3 MG tablet TAKE 3 TABLETS DAILY EXCEPT 2 TABLETS ONMONDAY AND FRIDAY OR AS DIRECTED BY CLINIC 09/19/21   Larey Dresser, MD    Physical Exam: Constitutional: Moderately built and nourished. Vitals:   10/10/21 2057 10/10/21 2230 10/11/21 0023 10/11/21 0601  BP: (!) 171/79 (!) 154/64 119/76 (!) 134/56  Pulse: (!) 102  (!) 102 95  Resp: 20  18 17   Temp:    99.2 F (37.3 C)  TempSrc:    Oral  SpO2: 100%  93% 93%  Weight:      Height:       Eyes: Anicteric no pallor. ENMT: No discharge from the ears eyes nose and mouth. Neck: No mass felt.  No neck rigidity. Respiratory: No rhonchi or  crepitations. Cardiovascular: S1-S2 heard. Abdomen: Soft nontender bowel sound present. Musculoskeletal: No edema. Skin: No rash. Neurologic: Alert awake oriented to time place and person.  Moves all extremities. Psychiatric: Appears normal.  Normal affect.   Labs on Admission: I have personally reviewed following labs and imaging studies  CBC: Recent Labs  Lab 10/10/21 2128  WBC 17.5*  NEUTROABS 15.2*  HGB 15.2  HCT 45.9  MCV 91.8  PLT 662   Basic Metabolic Panel: Recent Labs  Lab 10/10/21 2128  NA 135  K 4.1  CL 99  CO2 28  GLUCOSE 128*  BUN 15  CREATININE 1.09  CALCIUM 9.1   GFR: Estimated Creatinine Clearance: 53.8 mL/min (by C-G formula based on SCr of 1.09 mg/dL). Liver Function Tests: Recent Labs  Lab 10/10/21 2128  AST 265*  ALT 56*  ALKPHOS 58  BILITOT 2.6*  PROT 6.9  ALBUMIN 4.0   No results for input(s): LIPASE, AMYLASE in the last 168 hours. No results for input(s): AMMONIA in the last 168 hours. Coagulation Profile: Recent Labs  Lab 10/09/21 0852 10/10/21 2128  INR 2.6 1.6*   Cardiac Enzymes: Recent Labs  Lab 10/10/21 2128  CKTOTAL 12,793*   BNP (last 3 results) No results for input(s): PROBNP in the last 8760 hours. HbA1C: No results for input(s): HGBA1C in the last 72 hours. CBG: No results for input(s): GLUCAP in the last 168 hours. Lipid Profile: No results for input(s): CHOL, HDL, LDLCALC, TRIG, CHOLHDL, LDLDIRECT in the last 72 hours. Thyroid Function Tests: No results for input(s): TSH, T4TOTAL, FREET4, T3FREE, THYROIDAB in the last 72 hours. Anemia Panel: No results for input(s): VITAMINB12, FOLATE, FERRITIN, TIBC, IRON, RETICCTPCT in the last 72 hours. Urine analysis:    Component Value Date/Time   COLORURINE YELLOW 10/10/2021 2116   APPEARANCEUR HAZY (A) 10/10/2021 2116   LABSPEC 1.012 10/10/2021 2116   PHURINE 5.5 10/10/2021 2116   GLUCOSEU >1,000 (A) 10/10/2021 2116   HGBUR LARGE (A) 10/10/2021 2116    BILIRUBINUR NEGATIVE 10/10/2021 2116   [place] NEGATIVE 10/10/2021 2116   PROTEINUR 30 (A) 10/10/2021 2116   UROBILINOGEN 0.2 04/12/2014 1244   NITRITE NEGATIVE  10/10/2021 2116   LEUKOCYTESUR MODERATE (A) 10/10/2021 2116   Sepsis Labs: @LABRCNTIP (procalcitonin:4,lacticidven:4) ) Recent Results (from the past 240 hour(s))  Resp Panel by RT-PCR (Flu A&B, Covid) Urine, Clean Catch     Status: None   Collection Time: 10/11/21 12:44 AM   Specimen: Urine, Clean Catch; Nasopharyngeal(NP) swabs in vial transport medium  Result Value Ref Range Status   SARS Coronavirus 2 by RT PCR NEGATIVE NEGATIVE Final    Comment: (NOTE) SARS-CoV-2 target nucleic acids are NOT DETECTED.  The SARS-CoV-2 RNA is generally detectable in upper respiratory specimens during the acute phase of infection. The lowest concentration of SARS-CoV-2 viral copies this assay can detect is 138 copies/mL. A negative result does not preclude SARS-Cov-2 infection and should not be used as the sole basis for treatment or other patient management decisions. A negative result may occur with  improper specimen collection/handling, submission of specimen other than nasopharyngeal swab, presence of viral mutation(s) within the areas targeted by this assay, and inadequate number of viral copies(<138 copies/mL). A negative result must be combined with clinical observations, patient history, and epidemiological information. The expected result is Negative.  Fact Sheet for Patients:  EntrepreneurPulse.com.au  Fact Sheet for Healthcare Providers:  IncredibleEmployment.be  This test is no t yet approved or cleared by the [place] FDA and  has been authorized for detection and/or diagnosis of SARS-CoV-2 by FDA under an Emergency Use Authorization (EUA). This EUA will remain  in effect (meaning this test can be used) for the duration of the COVID-19 declaration under Section 564(b)(1) of the  Act, 21 U.S.C.section 360bbb-3(b)(1), unless the authorization is terminated  or revoked sooner.       Influenza A by PCR NEGATIVE NEGATIVE Final   Influenza B by PCR NEGATIVE NEGATIVE Final    Comment: (NOTE) The Xpert Xpress SARS-CoV-2/FLU/RSV plus assay is intended as an aid in the diagnosis of influenza from Nasopharyngeal swab specimens and should not be used as a sole basis for treatment. Nasal washings and aspirates are unacceptable for Xpert Xpress SARS-CoV-2/FLU/RSV testing.  Fact Sheet for Patients: EntrepreneurPulse.com.au  Fact Sheet for Healthcare Providers: IncredibleEmployment.be  This test is not yet approved or cleared by the [place] FDA and has been authorized for detection and/or diagnosis of SARS-CoV-2 by FDA under an Emergency Use Authorization (EUA). This EUA will remain in effect (meaning this test can be used) for the duration of the COVID-19 declaration under Section 564(b)(1) of the Act, 21 U.S.C. section 360bbb-3(b)(1), unless the authorization is terminated or revoked.  Performed at [facility], [address], [place], [place] [address]      Radiological Exams on Admission: No results found.  EKG: Independently reviewed.  Sinus bradycardia heart rate around 59 bpm.  Assessment/Plan Principal Problem:   Rhabdomyolysis Active Problems:   Essential hypertension, malignant   Chronic diastolic CHF (congestive heart failure) (HCC)   Pressure injury of skin   Urinary retention   Acute cystitis without hematuria    Rhabdomyolysis after fall for which patient is receiving fluids.  No other patient has history of CHF closely monitor respiratory status.  Follow CK levels. Urinary retention with possible UTI on Foley catheter and ceftriaxone.  Follow urine cultures.  Will need urology referral. History of A. fib on Coumadin and Tikosyn and metoprolol. History of CAD denies any chest  pain. Hyperlipidemia holding statins due to rhabdomyolysis at this time. History of bioprosthetic aortic valve replacement. History of peripheral vascular disease. Diabetes mellitus type 2 we  will keep patient on sliding scale coverage. Leukocytosis likely reactive. History of CHF holding Lasix due to patient receiving fluids for rhabdomyolysis. Elevated LFTs likely from rhabdomyolysis.  Abdomen appears benign.  Follow LFTs.   DVT prophylaxis: Coumadin. Code Status: Full code. Family Communication: Discussed with patient. Disposition Plan: Home. Consults called: Physical therapy. Admission status: Observation.   Rise Patience MD [facility] Pager [phone].  If 7PM-7AM, please contact night-coverage [email or web address] Password [email or web address]  10/11/2021, 6:30 AM

## 2021-10-11 NOTE — ED Notes (Signed)
Report called to Ashley, RN.

## 2021-10-11 NOTE — Progress Notes (Signed)
At [place] notified that Gean Birchwood MD would be patients admitting MD and that he was aware that patient is on floor.

## 2021-10-11 NOTE — Progress Notes (Signed)
Pharmacy: Dofetilide (Tikosyn) - Initial Consult Assessment and Electrolyte Replacement  Pharmacy consulted to assist in monitoring and replacing electrolytes in this 87 y.o. male admitted on 10/10/2021 undergoing dofetilide PTA continuation. Continue dofetilide dose: 10/29 at 2200.   Assessment:  Patient Exclusion Criteria: If any screening criteria checked as "Yes", then  patient  should NOT receive dofetilide until criteria item is corrected.  If "Yes" please indicate correction plan.  YES  NO Patient  Exclusion Criteria Correction Plan   [x]   []   Baseline QTc interval is greater than or equal to 440 msec. IF above YES box checked dofetilide contraindicated unless patient has ICD; then may proceed if QTc 500-550 msec or with known ventricular conduction abnormalities may proceed with QTc 550-600 msec. QTc = 0.44 Cardiology following. Ok with restarting PTA dose of Tikosyn. Will monitor closely. QT prolonging medications minimized.    []   [x]   Patient is known or suspected to have a digoxin level greater than 2 ng/ml: Lab Results  Component Value Date   DIGOXIN 0.8 09/14/2012       []   [x]   Creatinine clearance less than 20 ml/min (calculated using Cockcroft-Gault, actual body weight and serum creatinine): Estimated Creatinine Clearance: 54.8 mL/min (by C-G formula based on SCr of 1.07 mg/dL).     []   [x]  Patient has received drugs known to prolong the QT intervals within the last 48 hours (phenothiazines, tricyclics or tetracyclic antidepressants, erythromycin, H-1 antihistamines, cisapride, fluoroquinolones, azithromycin). Drugs not listed above may have an, as yet, undetected potential to prolong the QT interval, updated information on QT prolonging agents is available at this website:QT prolonging agents or www.crediblemeds.org    []   [x]   Patient received a dose of hydrochlorothiazide (Oretic) alone or in any combination including triamterene (Dyazide, Maxzide) in the  last 48 hours.    []   [x]  Patient received a medication known to increase dofetilide plasma concentrations prior to initial dofetilide dose:  Trimethoprim (Primsol, Proloprim) in the last 36 hours Verapamil (Calan, Verelan) in the last 36 hours or a sustained release dose in the last 72 hours Megestrol (Megace) in the last 5 days  Cimetidine (Tagamet) in the last 6 hours Ketoconazole (Nizoral) in the last 24 hours Itraconazole (Sporanox) in the last 48 hours  Prochlorperazine (Compazine) in the last 36 hours     []   [x]   Patient is known to have a history of torsades de pointes; congenital or acquired long QT syndromes.    []   [x]   Patient has received a Class 1 antiarrhythmic with less than 2 half-lives since last dose. (Disopyramide, Quinidine, Procainamide, Lidocaine, Mexiletine, Flecainide, Propafenone)    []   [x]   Patient has received amiodarone therapy in the past 3 months or amiodarone level is greater than 0.3 ng/ml.    Patient has been appropriately anticoagulated with warfarin.  Labs:    Component Value Date/Time   K 4.0 10/11/2021 0847   MG 1.8 10/11/2021 5621     Plan: Potassium: K >/= 4: Appropriate to initiate Tikosyn, no replacement needed    Magnesium: Mg 1.8-2: Give Mg 2 gm IV x1 to prevent Mg from dropping below 1.8 - do not need to recheck Mg. Appropriate to initiate Tikosyn  Update: Patient presented with multiple days of poor appetite and indicated he may have missed doses of tikosyn. Unsure of number of doses missed. Cardiology was consulted for evaluation and an ECG was ordered. Patient in NSR and on warfarin PTA. Warfarin has been continued (INR 1.6,  Goal 2-3) and Tikosyn will be restarted inpatient.

## 2021-10-11 NOTE — ED Notes (Signed)
Called [facility] to advise that patient has a bed ready, [facility] room 24

## 2021-10-11 NOTE — Plan of Care (Signed)

## 2021-10-11 NOTE — Evaluation (Signed)
Physical Therapy Evaluation Patient Details Name: Nathaniel [name]. MRN: [id number] DOB: 05-15-1932 Today's Date: 10/11/2021  History of Present Illness  87 y/o male presented to ED on 10/28 for urinary retention and fall and was on ground for at least 6 hours. Found to have UTI and rhabdomyolysis. PMH: Afib, AAA, CAD, CHF, CKD, DM type 2, GERD, HLD, HTN, PVD  Clinical Impression  PTA, patient lives alone and reports independence with use of cane. Patient reports 2 falls in past week but denies any more in past 6 months. Patient requires modA for ambulation with use of cane and HHAx1. Patient with poor safety awareness and refuses use of RW during session. Patient presents with generalized weakness, impaired balance, decreased activity tolerance. Patient will benefit from skilled PT services during acute stay to address listed deficits. Recommend SNF at discharge to maximize functional mobility and safety prior to returning home.        Recommendations for follow up therapy are one component of a multi-disciplinary discharge planning process, led by the attending physician.  Recommendations may be updated based on patient status, additional functional criteria and insurance authorization.  Follow Up Recommendations Skilled nursing-short term rehab (<3 hours/day)    Assistance Recommended at Discharge Frequent or constant Supervision/Assistance  Functional Status Assessment Patient has had a recent decline in their functional status and/or demonstrates limited ability to make significant improvements in function in a reasonable and predictable amount of time  Equipment Recommendations  Rolling Keyion Knack (2 wheels)    Recommendations for Other Services       Precautions / Restrictions Precautions Precautions: Fall Precaution Comments: foley Restrictions Weight Bearing Restrictions: No      Mobility  Bed Mobility Overal bed mobility: Needs Assistance Bed Mobility: Supine to Sit;Sit to  Supine     Supine to sit: Mod assist Sit to supine: Min guard   General bed mobility comments: modA for trunk elevation and repositioning hips towards EOB    Transfers Overall transfer level: Needs assistance Equipment used: Straight cane Transfers: Sit to/from Stand Sit to Stand: Mod assist           General transfer comment: modA to power up into standing    Ambulation/Gait Ambulation/Gait assistance: Mod assist Gait Distance (Feet): 10 Feet Assistive device: Straight cane;1 person hand held assist Gait Pattern/deviations: Step-to pattern;Decreased stride length;Knee flexed in stance - right;Knee flexed in stance - left;Shuffle;Trunk flexed Gait velocity: decreased   General Gait Details: very unsteady, poor safety awareness. Requires modA for short ambulation. Staggering L and R  Stairs            Wheelchair Mobility    Modified Rankin (Stroke Patients Only)       Balance Overall balance assessment: Needs assistance;History of Falls Sitting-balance support: Single extremity supported;Feet supported Sitting balance-Leahy Scale: Fair     Standing balance support: Single extremity supported;During functional activity Standing balance-Leahy Scale: Poor Standing balance comment: requires modA for balance during ambulation                             Pertinent Vitals/Pain Pain Assessment: Faces Faces Pain Scale: Hurts even more Pain Location: perineum - foley placement Pain Descriptors / Indicators: Aching;Grimacing;Discomfort Pain Intervention(s): Monitored during session;Repositioned    Home Living Family/patient expects to be discharged to:: Private residence Living Arrangements: Alone Available Help at Discharge: Family;Available PRN/intermittently Type of Home: House Home Access: Stairs to enter Entrance Stairs-Rails: None Entrance Stairs-Number of  Steps: 3   Home Layout: One level Home Equipment: Cane - single point;Shower seat -  built in      Prior Function Prior Level of Function : Independent/Modified Independent;Driving;History of Falls (last six months)             Mobility Comments: ambulates with cane recently. Reports 2 falls in past week but denies any more in last 6 months. Drives ADLs Comments: manages own medications and finances     Hand Dominance        Extremity/Trunk Assessment   Upper Extremity Assessment Upper Extremity Assessment: Generalized weakness    Lower Extremity Assessment Lower Extremity Assessment: Generalized weakness    Cervical / Trunk Assessment Cervical / Trunk Assessment: Kyphotic  Communication   Communication: No difficulties  Cognition Arousal/Alertness: Awake/alert Behavior During Therapy: WFL for tasks assessed/performed Overall Cognitive Status: Impaired/Different from baseline Area of Impairment: Safety/judgement;Problem solving                         Safety/Judgement: Decreased awareness of safety;Decreased awareness of deficits   Problem Solving: Slow processing;Requires verbal cues          General Comments      Exercises     Assessment/Plan    PT Assessment Patient needs continued PT services  PT Problem List Decreased strength;Decreased activity tolerance;Decreased balance;Decreased mobility;Decreased safety awareness;Decreased knowledge of use of DME       PT Treatment Interventions DME instruction;Gait training;Stair training;Therapeutic activities;Functional mobility training;Therapeutic exercise;Balance training;Patient/family education    PT Goals (Current goals can be found in the Care Plan section)  Acute Rehab PT Goals Patient Stated Goal: to go home PT Goal Formulation: With patient/family Time For Goal Achievement: 10/25/21 Potential to Achieve Goals: Fair    Frequency Min 3X/week   Barriers to discharge Decreased caregiver support      Co-evaluation               AM-PAC PT "6 Clicks" Mobility   Outcome Measure Help needed turning from your back to your side while in a flat bed without using bedrails?: A Little Help needed moving from lying on your back to sitting on the side of a flat bed without using bedrails?: A Lot Help needed moving to and from a bed to a chair (including a wheelchair)?: A Lot Help needed standing up from a chair using your arms (e.g., wheelchair or bedside chair)?: A Lot Help needed to walk in hospital room?: A Lot Help needed climbing 3-5 steps with a railing? : Total 6 Click Score: 12    End of Session Equipment Utilized During Treatment: Gait belt Activity Tolerance: Patient tolerated treatment well Patient left: in bed;with call bell/phone within reach;with bed alarm set Nurse Communication: Mobility status PT Visit Diagnosis: Unsteadiness on feet (R26.81);Muscle weakness (generalized) (M62.81);History of falling (Z91.81)    Time: 7510-2585 PT Time Calculation (min) (ACUTE ONLY): 16 min   Charges:              Jaidalyn Schillo A. Gilford Rile PT, DPT Acute Rehabilitation Services Pager [phone] Office [phone]   Linna Hoff 10/11/2021, 1:17 PM

## 2021-10-12 DIAGNOSIS — I4819 Other persistent atrial fibrillation: Secondary | ICD-10-CM

## 2021-10-12 DIAGNOSIS — T796XXA Traumatic ischemia of muscle, initial encounter: Secondary | ICD-10-CM | POA: Diagnosis not present

## 2021-10-12 LAB — BASIC METABOLIC PANEL
Anion gap: 9 (ref 5–15)
BUN: 16 mg/dL (ref 8–23)
CO2: 22 mmol/L (ref 22–32)
Calcium: 8.2 mg/dL — ABNORMAL LOW (ref 8.9–10.3)
Chloride: 103 mmol/L (ref 98–111)
Creatinine, Ser: 1.11 mg/dL (ref 0.61–1.24)
GFR, Estimated: >60 mL/min (ref >60)
Glucose, Bld: 110 mg/dL — ABNORMAL HIGH (ref 70–99)
Potassium: 4 mmol/L (ref 3.5–5.1)
Sodium: 134 mmol/L — ABNORMAL LOW (ref 135–145)

## 2021-10-12 LAB — CK
Total CK: 8225 U/L — ABNORMAL HIGH (ref 49–397)
Total CK: 7535 U/L — ABNORMAL HIGH (ref 49–397)

## 2021-10-12 LAB — GLUCOSE, CAPILLARY
Glucose-Capillary: 96 mg/dL (ref 70–99)
Glucose-Capillary: 128 mg/dL — ABNORMAL HIGH (ref 70–99)
Glucose-Capillary: 181 mg/dL — ABNORMAL HIGH (ref 70–99)
Glucose-Capillary: 156 mg/dL — ABNORMAL HIGH (ref 70–99)

## 2021-10-12 LAB — CBC
HCT: 38.8 % — ABNORMAL LOW (ref 39.0–52.0)
Hemoglobin: 12.4 g/dL — ABNORMAL LOW (ref 13.0–17.0)
MCH: 30 pg (ref 26.0–34.0)
MCHC: 32 g/dL (ref 30.0–36.0)
MCV: 93.7 fL (ref 80.0–100.0)
Platelets: 125 10*3/uL — ABNORMAL LOW (ref 150–400)
RBC: 4.14 MIL/uL — ABNORMAL LOW (ref 4.22–5.81)
RDW: 13 % (ref 11.5–15.5)
WBC: 12.6 10*3/uL — ABNORMAL HIGH (ref 4.0–10.5)
nRBC: 0 % (ref 0.0–0.2)

## 2021-10-12 LAB — MAGNESIUM: Magnesium: 2.1 mg/dL (ref 1.7–2.4)

## 2021-10-12 LAB — PROTIME-INR
INR: 1.3 — ABNORMAL HIGH (ref 0.8–1.2)
Prothrombin Time: 16.3 seconds — ABNORMAL HIGH (ref 11.4–15.2)

## 2021-10-12 MED ORDER — LACTATED RINGERS IV SOLN: INTRAVENOUS | Status: DC

## 2021-10-12 MED ORDER — MELATONIN 3 MG PO TABS
3.0000 mg | ORAL_TABLET | Freq: Every evening | ORAL | Status: DC | PRN
  Administered 2021-10-12 – 2021-10-13 (×2): 3 mg via ORAL
  Filled 2021-10-12 (×2): qty 1

## 2021-10-12 MED ORDER — SPIRONOLACTONE 12.5 MG HALF TABLET
12.5000 mg | ORAL_TABLET | Freq: Every day | ORAL | Status: DC
  Filled 2021-10-12: qty 1

## 2021-10-12 MED ORDER — WARFARIN SODIUM 5 MG PO TABS
10.0000 mg | ORAL_TABLET | Freq: Once | ORAL | Status: AC
  Administered 2021-10-12: 10 mg via ORAL
  Filled 2021-10-12: qty 2

## 2021-10-12 NOTE — Progress Notes (Addendum)
Progress Note  Patient Name: Nathaniel [name]. Date of Encounter: 10/12/2021  Primary Cardiologist: McClain/Camnitz  Subjective   Overnight EKG not down after Tikosyn dose.  Rare PVCs and one run on SVT.  NO chest pain, SOB, palpitations. He is working with his family to get things sorted at home while he recovers.  Inpatient Medications    Scheduled Meds:  Chlorhexidine Gluconate Cloth  6 each Topical Daily   dofetilide  250 mcg Oral BID   ezetimibe  10 mg Oral QHS   insulin aspart  0-9 Units Subcutaneous TID WC   latanoprost  1 drop Both Eyes QHS   metoprolol succinate  25 mg Oral BID   sodium chloride flush  3 mL Intravenous Q12H   tamsulosin  0.4 mg Oral BID   timolol  1 drop Both Eyes QHS   Warfarin - Pharmacist Dosing Inpatient   Does not apply q1600   Continuous Infusions:  sodium chloride     cefTRIAXone (ROCEPHIN)  IV 1 g (10/11/21 2320)   PRN Meds: sodium chloride, sodium chloride flush   Vital Signs    Vitals:   10/11/21 2104 10/12/21 0439 10/12/21 0716 10/12/21 0815  BP: (!) 112/54 (!) 114/44 (!) 139/56 (!) 120/56  Pulse: 65 (!) 58 62 62  Resp: 19   16  Temp: 98.2 F (36.8 C) 98.7 F (37.1 C)  98.4 F (36.9 C)  TempSrc: Oral Oral  Oral  SpO2: 95% 92% 93% 92%  Weight:      Height:        Intake/Output Summary (Last 24 hours) at 10/12/2021 0911 Last data filed at 10/12/2021 0457 Gross per 24 hour  Intake 2164.78 ml  Output 1900 ml  Net 264.78 ml   Filed Weights   10/10/21 1909  Weight: 89.4 kg    Telemetry    SR with rare PVCs and one run on SVT, No AF - Personally Reviewed  ECG    SR with Qtc 445 - Personally Reviewed  Physical Exam   GEN: No acute distress.   Neck: No JVD Cardiac: RRR, no murmurs, rubs, or gallops.  Respiratory: Clear to auscultation bilaterally. GI: Soft, nontender, non-distended  MS: No edema; No deformity. Neuro:  Nonfocal  Psych: Normal affect   Labs    Chemistry Recent Labs  Lab 10/10/21 2128  10/11/21 0847 10/11/21 1709 10/12/21 0443  NA 135 134* 134* 134*  K 4.1 4.0 4.0 4.0  CL 99 102 101 103  CO2 28 25 27 22   GLUCOSE 128* 127* 146* 110*  BUN 15 13 13 16   CREATININE 1.09 1.07 1.24 1.11  CALCIUM 9.1 8.3* 8.2* 8.2*  PROT 6.9 5.7*  --   --   ALBUMIN 4.0 3.0*  --   --   AST 265* 203*  --   --   ALT 56* 50*  --   --   ALKPHOS 58 58  --   --   BILITOT 2.6* 2.3*  --   --   GFRNONAA >60 >60 56* >60  ANIONGAP 8 7 6 9      Hematology Recent Labs  Lab 10/10/21 2128 10/11/21 0847 10/12/21 0443  WBC 17.5* 16.0* 12.6*  RBC 5.00 4.33 4.14*  HGB 15.2 13.5 12.4*  HCT 45.9 39.9 38.8*  MCV 91.8 92.1 93.7  MCH 30.4 31.2 30.0  MCHC 33.1 33.8 32.0  RDW 13.1 13.1 13.0  PLT 162 147* 125*    Cardiac EnzymesNo results for input(s): TROPONINI in the  last 168 hours. No results for input(s): TROPIPOC in the last 168 hours.   BNPNo results for input(s): BNP, PROBNP in the last 168 hours.   DDimer No results for input(s): DDIMER in the last 168 hours.   Radiology    No results found.   Patient Profile     87 y.o. male with AF on Tikoysn stopped in the setting of fall and subsequent rhabdmyolysis (no AKI through course; 4 doses missed)  Assessment & Plan    Atrial Fibrillation CHADSVASC 6 on warfarin and subtherapeutic INR CAD, PAD, and HF - I have re-ordered a STAT ECG (ECGs have not been done as per Tikoysn protocol- 250 dose, has received 2 doses) - no hyperkalemia and stable CK (8000-> 7000-> 8000), I have started back half of his aldactone, continue to hold ACEi - warfarin is restarted - will reach out to day time in regard to monitoring   For questions or updates, please contact [facility] Please consult [email or web address] for contact info under Cardiology/STEMI.      Signed, Werner Lean, MD  10/12/2021, 9:11 AM     QRc is 455 ms with rare PVC; < 15% increase and less than 500 ms.  We will continue medication for now; discussed with primary team  and Pharm D team; will attempt to transfer to floor that has more familiarity with Tikoysn re-loading.  DC of aldactone.  Rudean Haskell, MD [address], [address] [place], [place] [address] [phone]  10:42 AM

## 2021-10-12 NOTE — Progress Notes (Signed)
ANTICOAGULATION CONSULT NOTE - Initial Consult  Pharmacy Consult for warfarin Indication: atrial fibrillation  Allergies  Allergen Reactions   Grass Extracts [Gramineae Pollens] Other (See Comments)    Sneezing and runny nose   Milk-Related Compounds Other (See Comments)    Just Yogurt.  Makes him very lethargic.   Amiodarone Anxiety    depression   Patient Measurements: Height: 5\' 10"  (177.8 cm) Weight: 89.4 kg (197 lb) IBW/kg (Calculated) : 73  Vital Signs: Temp: 98.4 F (36.9 C) (10/30 0815) Temp Source: Oral (10/30 0815) BP: 120/56 (10/30 0815) Pulse Rate: 62 (10/30 0815)  Labs: Recent Labs    10/10/21 2128 10/11/21 0847 10/11/21 1709 10/12/21 0443  HGB 15.2 13.5  --  12.4*  HCT 45.9 39.9  --  38.8*  PLT 162 147*  --  125*  LABPROT 19.4*  --   --  16.3*  INR 1.6*  --   --  1.3*  CREATININE 1.09 1.07 1.24 1.11  CKTOTAL 12,793* 6,282* 7,754* 8,225*     Estimated Creatinine Clearance: 52.8 mL/min (by C-G formula based on SCr of 1.11 mg/dL).   Medical History: Past Medical History:  Diagnosis Date   AAA (abdominal aortic aneurysm)    Atrial fibrillation (HCC)    Atrial fibrillation (HCC)    CAD (coronary artery disease)    Cataract    CHF (congestive heart failure) (HCC)    Chronic kidney disease    Colon polyps    adenomatous   Diabetes mellitus    Diverticulosis    Dysrhythmia    A-Fib   GERD (gastroesophageal reflux disease)    Gout    Hernia, abdominal    History of aortic valve replacement with bioprosthetic valve    2007   Hx: UTI (urinary tract infection)    Hyperlipemia    Hypertension    Internal hemorrhoids    Peripheral vascular disease (HCC)    Shortness of breath    with exertion    Medications:  Medications Prior to Admission  Medication Sig Dispense Refill Last Dose   allopurinol (ZYLOPRIM) 300 MG tablet Take 300 mg by mouth daily.   2 10/09/2021   atorvastatin (LIPITOR) 10 MG tablet TAKE ONE TABLET DAILY (Patient taking  differently: Take 10 mg by mouth daily.) 30 tablet 11 10/09/2021   atorvastatin (LIPITOR) 20 MG tablet Take 20 mg by mouth every other day. Along with 10 mg to total 30 mg on alternate days   10/08/2021   atorvastatin (LIPITOR) 20 MG tablet Take 20 mg by mouth daily.   10/09/2021   Coenzyme Q10 (CO Q10) 200 MG CAPS Take 200 mg by mouth daily.   10/09/2021   dofetilide (TIKOSYN) 250 MCG capsule TAKE ONE CAPSULE TWICE A DAY (Patient taking differently: Take 250 mcg by mouth 2 (two) times daily.) 60 capsule 11 10/09/2021   empagliflozin (JARDIANCE) 10 MG TABS tablet Take 10 mg by mouth daily.   10/08/2021   Evolocumab (REPATHA SURECLICK) 500 MG/ML SOAJ Inject 1 pen into the skin every 14 (fourteen) days. (Patient taking differently: Inject 140 mg into the skin every 14 (fourteen) days.) 2 mL 11 10/08/2021   ezetimibe (ZETIA) 10 MG tablet Take 1 tablet (10 mg total) by mouth at bedtime. 30 tablet 3 10/08/2021   furosemide (LASIX) 20 MG tablet Take 20-40 mg by mouth as directed. Rotating every other day; 20mg  one day and 40mg  the next day   10/09/2021   latanoprost (XALATAN) 0.005 % ophthalmic solution Place 1 drop  into both eyes daily.   10/09/2021   Metoprolol Succinate 25 MG CS24 Take 25 mg by mouth 2 (two) times daily.   10/09/2021 at 9 am   Misc Natural Products (GLUCOSAMINE CHONDROITIN TRIPLE) TABS Take 1 tablet by mouth daily.   10/08/2021   ramipril (ALTACE) 2.5 MG capsule Take 1 capsule (2.5 mg total) by mouth 2 (two) times daily. (Patient taking differently: Take 5 mg by mouth daily.) 90 capsule 3 10/09/2021   spironolactone (ALDACTONE) 25 MG tablet TAKE ONE TABLET DAILY (Patient taking differently: Take 25 mg by mouth daily.) 30 tablet 3 10/08/2021   tamsulosin (FLOMAX) 0.4 MG CAPS capsule Take 0.4 mg by mouth 2 (two) times daily.   10/09/2021   timolol (TIMOPTIC) 0.5 % ophthalmic solution Place 1 drop into both eyes at bedtime.  5 10/09/2021   warfarin (COUMADIN) 3 MG tablet TAKE 3 TABLETS  DAILY EXCEPT 2 TABLETS ONMONDAY AND FRIDAY OR AS DIRECTED BY CLINIC (Patient taking differently: Take 6-9 mg by mouth See admin instructions. Takes 6 mg on Monday and Friday ,then 9 mg on all other days) 90 tablet 2 10/09/2021 at 9 am    Assessment: 13 yom with PMH significant for CAD s/p CABG, bioprosthetic aortic valve, afib, PVD, and DM. Patient presents s/p fall at home, no loss of consciousness or head injury. Patient on warfarin PTA.   PTA Dosing: 6MG  MF and 9MG  AOD (Weekly Total 57 MG)   Most recent INR 1.3 (subtherapeutic) s/p warfarin 10MG  on 10/29. Current Hgb 12.6 downtrending. No noted signs of bleed. Patient remains in NSR and received first dose of Tikosyn on 10/29 @2200 . Of note, patient reports poor diet recently.   Goal of Therapy:  INR 2-3 Monitor platelets by anticoagulation protocol: Yes   Plan:  Warfarin 10MG  x1 today  Daily INR and CBC  Monitor for s/sx of bleed  Monitor ECG and plan per cardiology   Cephus Slater, PharmD, [facility] Resident [phone] 10/12/2021 10:30 AM

## 2021-10-12 NOTE — Progress Notes (Signed)
PROGRESS NOTE    Nathaniel [name].  VEL:[id number] DOB: 1932-05-30 DOA: 10/10/2021 PCP: Lorne Skeens, MD    Brief Narrative:  87 year old gentleman with known history of coronary artery disease status post CABG, bioprosthetic aortic valve, paroxysmal atrial fibrillation on Tikosyn status post cardioversion, diabetes, peripheral vascular disease and BPH presented with fall at home, 6 hours on the floor.  He was found to have rhabdomyolysis with CK level of more than 12,000, WBC count 17.5.  Missed dose of Coumadin with INR 1.6 and missed doses of Tikosyn.  Patient admitted to the hospital with rhabdomyolysis, missed Tikosyn doses and acute urinary retention.   Assessment & Plan:   Principal Problem:   Rhabdomyolysis Active Problems:   Essential hypertension, malignant   Chronic diastolic CHF (congestive heart failure) (HCC)   Pressure injury of skin   Urinary retention   Acute cystitis without hematuria  Acute traumatic rhabdomyolysis: Clinically improving.  Urine output is adequate.  Renal functions are adequate. Continue IV fluids.  Intake and output monitoring.  Continue monitor CK levels.  With clinical improvement, serum levels expected to lag behind.  Paroxysmal atrial fibrillation: Treated with cardioversion and subsequently on Tikosyn.  Patient on Coumadin.  Missed 3-4 doses of Tikosyn. Started with reloading protocol, followed by cardiology and pharmacy.  Electrolytes are adequate. Currently in sinus rhythm. Reducing Coumadin.  Acute urinary retention with history of BPH: Foley catheter placed while in the ER.  He does have history of prostatism and urinary retention.  He wants to have a voiding trial today.  He thinks he can urinate. DC Foley catheter.  Give voiding trial.  If retains again, will discharge with Foley catheter and request outpatient follow-up with urology.  Chronic diastolic heart failure: Euvolemic.  On maintenance IV fluids.  Essential  hypertension: Blood pressures fairly stable at this time.  Physical deconditioning and frailty: Work with PT OT.  Suspected UTI: Less likely.  Urine culture pending.  Rocephin for 3 days.  DVT prophylaxis:  warfarin (COUMADIN) tablet 10 mg   Code Status: Full code Family Communication: None Disposition Plan: Status is: Inpatient  Remains inpatient appropriate because: High risk medication adjustment, monitoring of toxicity.  IV fluids.         Consultants:  Cardiology  Procedures:  None  Antimicrobials:  Rocephin 10/28---   Subjective: Patient seen and examined.  Today he denies any complaints.  He wants me to take his Foley catheter out and he thinks he can urinate.  Denies any nausea vomiting.  No palpitation or chest pain. Telemetry with sinus rhythm.  Objective: Vitals:   10/11/21 2104 10/12/21 0439 10/12/21 0716 10/12/21 0815  BP: (!) 112/54 (!) 114/44 (!) 139/56 (!) 120/56  Pulse: 65 (!) 58 62 62  Resp: 19   16  Temp: 98.2 F (36.8 C) 98.7 F (37.1 C)  98.4 F (36.9 C)  TempSrc: Oral Oral  Oral  SpO2: 95% 92% 93% 92%  Weight:      Height:        Intake/Output Summary (Last 24 hours) at 10/12/2021 1232 Last data filed at 10/12/2021 0457 Gross per 24 hour  Intake 2164.78 ml  Output 1400 ml  Net 764.78 ml   Filed Weights   10/10/21 1909  Weight: 89.4 kg    Examination:  General exam: Appears calm and comfortable  Respiratory system: Clear to auscultation. Respiratory effort normal. Cardiovascular system: S1 & S2 heard, RRR. No JVD, murmurs, rubs, gallops or clicks. No pedal edema. Gastrointestinal system:  Abdomen is nondistended, soft and nontender. No organomegaly or masses felt. Normal bowel sounds heard. Central nervous system: Alert and oriented. No focal neurological deficits. Extremities: Symmetric 5 x 5 power. Chronic venous stasis changes both legs.  No edema. Foley catheter with clear urine.   Data Reviewed: I have personally  reviewed following labs and imaging studies  CBC: Recent Labs  Lab 10/10/21 2128 10/11/21 0847 10/12/21 0443  WBC 17.5* 16.0* 12.6*  NEUTROABS 15.2* 13.0*  --   HGB 15.2 13.5 12.4*  HCT 45.9 39.9 38.8*  MCV 91.8 92.1 93.7  PLT 162 147* 161*   Basic Metabolic Panel: Recent Labs  Lab 10/10/21 2128 10/11/21 0629 10/11/21 0847 10/11/21 1709 10/12/21 0443  NA 135  --  134* 134* 134*  K 4.1  --  4.0 4.0 4.0  CL 99  --  102 101 103  CO2 28  --  25 27 22   GLUCOSE 128*  --  127* 146* 110*  BUN 15  --  13 13 16   CREATININE 1.09  --  1.07 1.24 1.11  CALCIUM 9.1  --  8.3* 8.2* 8.2*  MG  --  1.8  --  2.4 2.1   GFR: Estimated Creatinine Clearance: 52.8 mL/min (by C-G formula based on SCr of 1.11 mg/dL). Liver Function Tests: Recent Labs  Lab 10/10/21 2128 10/11/21 0847  AST 265* 203*  ALT 56* 50*  ALKPHOS 58 58  BILITOT 2.6* 2.3*  PROT 6.9 5.7*  ALBUMIN 4.0 3.0*   No results for input(s): LIPASE, AMYLASE in the last 168 hours. No results for input(s): AMMONIA in the last 168 hours. Coagulation Profile: Recent Labs  Lab 10/09/21 0852 10/10/21 2128 10/12/21 0443  INR 2.6 1.6* 1.3*   Cardiac Enzymes: Recent Labs  Lab 10/10/21 2128 10/11/21 0847 10/11/21 1709 10/12/21 0443  CKTOTAL 12,793* 6,282* 7,754* 8,225*   BNP (last 3 results) No results for input(s): PROBNP in the last 8760 hours. HbA1C: No results for input(s): HGBA1C in the last 72 hours. CBG: Recent Labs  Lab 10/11/21 1222 10/11/21 1738 10/11/21 2102 10/12/21 0816 10/12/21 1147  GLUCAP 120* 146* 156* 96 128*   Lipid Profile: No results for input(s): CHOL, HDL, LDLCALC, TRIG, CHOLHDL, LDLDIRECT in the last 72 hours. Thyroid Function Tests: No results for input(s): TSH, T4TOTAL, FREET4, T3FREE, THYROIDAB in the last 72 hours. Anemia Panel: No results for input(s): VITAMINB12, FOLATE, FERRITIN, TIBC, IRON, RETICCTPCT in the last 72 hours. Sepsis Labs: No results for input(s): PROCALCITON,  LATICACIDVEN in the last 168 hours.  Recent Results (from the past 240 hour(s))  Urine Culture     Status: Abnormal (Preliminary result)   Collection Time: 10/10/21  9:16 PM   Specimen: Urine, Catheterized  Result Value Ref Range Status   Specimen Description   Final    URINE, CATHETERIZED Performed at [facility], [address] [place], [place] [address]    Special Requests   Final    NONE Performed at [facility], [address] [place], [place] [address]    Culture (A)  Final    >=100,000 COLONIES/mL ESCHERICHIA COLI SUSCEPTIBILITIES TO FOLLOW Performed at [facility] [address], [place], [place] [address]    Report Status PENDING  Incomplete  Resp Panel by RT-PCR (Flu A&B, Covid) Urine, Clean Catch     Status: None   Collection Time: 10/11/21 12:44 AM   Specimen: Urine, Clean Catch; Nasopharyngeal(NP) swabs in vial transport medium  Result Value Ref Range  Status   SARS Coronavirus 2 by RT PCR NEGATIVE NEGATIVE Final    Comment: (NOTE) SARS-CoV-2 target nucleic acids are NOT DETECTED.  The SARS-CoV-2 RNA is generally detectable in upper respiratory specimens during the acute phase of infection. The lowest concentration of SARS-CoV-2 viral copies this assay can detect is 138 copies/mL. A negative result does not preclude SARS-Cov-2 infection and should not be used as the sole basis for treatment or other patient management decisions. A negative result may occur with  improper specimen collection/handling, submission of specimen other than nasopharyngeal swab, presence of viral mutation(s) within the areas targeted by this assay, and inadequate number of viral copies(<138 copies/mL). A negative result must be combined with clinical observations, patient history, and epidemiological information. The expected result is Negative.  Fact Sheet for Patients:  EntrepreneurPulse.com.au  Fact Sheet for  Healthcare Providers:  IncredibleEmployment.be  This test is no t yet approved or cleared by the [place] FDA and  has been authorized for detection and/or diagnosis of SARS-CoV-2 by FDA under an Emergency Use Authorization (EUA). This EUA will remain  in effect (meaning this test can be used) for the duration of the COVID-19 declaration under Section 564(b)(1) of the Act, 21 U.S.C.section 360bbb-3(b)(1), unless the authorization is terminated  or revoked sooner.       Influenza A by PCR NEGATIVE NEGATIVE Final   Influenza B by PCR NEGATIVE NEGATIVE Final    Comment: (NOTE) The Xpert Xpress SARS-CoV-2/FLU/RSV plus assay is intended as an aid in the diagnosis of influenza from Nasopharyngeal swab specimens and should not be used as a sole basis for treatment. Nasal washings and aspirates are unacceptable for Xpert Xpress SARS-CoV-2/FLU/RSV testing.  Fact Sheet for Patients: EntrepreneurPulse.com.au  Fact Sheet for Healthcare Providers: IncredibleEmployment.be  This test is not yet approved or cleared by the [place] FDA and has been authorized for detection and/or diagnosis of SARS-CoV-2 by FDA under an Emergency Use Authorization (EUA). This EUA will remain in effect (meaning this test can be used) for the duration of the COVID-19 declaration under Section 564(b)(1) of the Act, 21 U.S.C. section 360bbb-3(b)(1), unless the authorization is terminated or revoked.  Performed at [facility], [address] [place], [place] [address]          Radiology Studies: No results found.      Scheduled Meds:  Chlorhexidine Gluconate Cloth  6 each Topical Daily   dofetilide  250 mcg Oral BID   ezetimibe  10 mg Oral QHS   insulin aspart  0-9 Units Subcutaneous TID WC   latanoprost  1 drop Both Eyes QHS   metoprolol succinate  25 mg Oral BID   sodium chloride flush  3 mL Intravenous Q12H    tamsulosin  0.4 mg Oral BID   timolol  1 drop Both Eyes QHS   warfarin  10 mg Oral ONCE-1600   Warfarin - Pharmacist Dosing Inpatient   Does not apply q1600   Continuous Infusions:  sodium chloride     cefTRIAXone (ROCEPHIN)  IV 1 g (10/11/21 2320)   lactated ringers 100 mL/hr at 10/12/21 1126     LOS: 1 day    Time spent: 35 minutes    Barb Merino, MD [facility] Pager [phone]

## 2021-10-12 NOTE — NC FL2 (Signed)
[place] LEVEL OF CARE SCREENING TOOL     IDENTIFICATION  Patient Name: Nathaniel [name]. Birthdate: September 10, 1932 Sex: male Admission Date (Current Location): 10/10/2021  [facility] and [place] Number:  Herbalist and Address:  [facility], [place] [address] [place], [place] [address]      Provider Number: [id number]  Attending Physician Name and Address:  Barb Merino, MD  Relative Name and Phone Number:  Norwin, Aleman [phone]    Current Level of Care: Hospital Recommended Level of Care: [place] Prior Approval Number:    Date Approved/Denied:   PASRR Number: [id number]  Discharge Plan: SNF    Current Diagnoses: Patient Active Problem List   Diagnosis Date Noted   Pressure injury of skin 10/11/2021   Urinary retention 10/11/2021   Acute cystitis without hematuria    Rhabdomyolysis 10/10/2021   Afib ([place]) 06/12/2019   Asymptomatic carotid artery stenosis without infarction, right 02/02/2018   Lung infiltrate 01/06/2018   Acute bronchiolitis due to human metapneumovirus 01/06/2018   Acute on chronic combined systolic and diastolic CHF (congestive heart failure) ([place])    Cough 12/19/2017   Acute bronchitis 12/19/2017   Solitary pulmonary nodule 03/23/2016   Pulmonary nodule 12/25/2015   Aspiration of foreign body    Lung nodule < 6cm on CT    Foreign body aspiration 12/23/2015   Encounter for therapeutic drug monitoring 32/11/2481   Chronic systolic CHF (congestive heart failure) ([place]) 06/12/2014   Chronic diastolic CHF (congestive heart failure) ([place]) 04/23/2014   Atrial fibrillation with rapid ventricular response ([place]) 04/20/2014   AAA (abdominal aortic aneurysm) without rupture 04/13/2014   Occlusion and stenosis of carotid artery without mention of cerebral infarction 04/09/2014   CAD (coronary artery disease) of artery bypass graft 09/14/2012   Abdominal aortic aneurysm ([place])  09/14/2012   Peripheral vascular disease with claudication ([place]) 09/14/2012   Carotid artery disease without cerebral infarction ([place]) 09/14/2012   History of aortic valve replacement with bioprosthetic valve 09/14/2012   Essential hypertension, malignant 09/14/2012   Hyperlipidemia with target LDL less than 70 09/14/2012   ABDOMINAL AORTIC ANEURYSM 07/06/2009   CEREBROVASCULAR DISEASE, HX OF 07/06/2009    Orientation RESPIRATION BLADDER Height & Weight     Self, Time, Situation, Place  Normal Indwelling catheter, Continent Weight: 197 lb (89.4 kg) Height:  5\' 10"  (177.8 cm)  BEHAVIORAL SYMPTOMS/MOOD NEUROLOGICAL BOWEL NUTRITION STATUS      Continent Diet (see discharge summary)  AMBULATORY STATUS COMMUNICATION OF NEEDS Skin   Limited Assist Verbally Normal                       Personal Care Assistance Level of Assistance  Bathing, Feeding, Dressing Bathing Assistance: Limited assistance Feeding assistance: Independent Dressing Assistance: Limited assistance     Functional Limitations Info  Sight, Hearing, Speech Sight Info: Adequate Hearing Info: Adequate Speech Info: Adequate    SPECIAL CARE FACTORS FREQUENCY  PT (By licensed PT), OT (By licensed OT)     PT Frequency: 5x week OT Frequency: 5x week            Contractures Contractures Info: Not present    Additional Factors Info  Code Status, Allergies Code Status Info: full Allergies Info: Grass Extracts (Gramineae Pollens), Milk-related Compounds, Amiodarone           Current Medications (10/12/2021):  This is the current hospital active medication list Current Facility-Administered Medications  Medication Dose Route  Frequency Provider Last Rate Last Admin   0.9 %  sodium chloride infusion  250 mL Intravenous PRN Chandrasekhar, Mahesh A, MD       cefTRIAXone (ROCEPHIN) 1 g in sodium chloride 0.9 % 100 mL IVPB  1 g Intravenous Q24H Rise Patience, MD 200 mL/hr at 10/11/21 2320 1 g at 10/11/21  2320   Chlorhexidine Gluconate Cloth 2 % PADS 6 each  6 each Topical Daily Barb Merino, MD   6 each at 10/12/21 1016   dofetilide (TIKOSYN) capsule 250 mcg  250 mcg Oral BID Chandrasekhar, Mahesh A, MD   250 mcg at 10/12/21 0900   ezetimibe (ZETIA) tablet 10 mg  10 mg Oral QHS Rise Patience, MD   10 mg at 10/11/21 2223   insulin aspart (novoLOG) injection 0-9 Units  0-9 Units Subcutaneous TID WC Rise Patience, MD   1 Units at 10/12/21 1203   lactated ringers infusion   Intravenous Continuous Barb Merino, MD 100 mL/hr at 10/12/21 1126 New Bag at 10/12/21 1126   latanoprost (XALATAN) 0.005 % ophthalmic solution 1 drop  1 drop Both Eyes QHS Rise Patience, MD   1 drop at 10/11/21 2224   metoprolol succinate (TOPROL-XL) 24 hr tablet 25 mg  25 mg Oral BID Rise Patience, MD   25 mg at 10/12/21 0949   sodium chloride flush (NS) 0.9 % injection 3 mL  3 mL Intravenous Q12H Chandrasekhar, Mahesh A, MD   3 mL at 10/12/21 0951   sodium chloride flush (NS) 0.9 % injection 3 mL  3 mL Intravenous PRN Chandrasekhar, Mahesh A, MD       tamsulosin (FLOMAX) capsule 0.4 mg  0.4 mg Oral BID Rise Patience, MD   0.4 mg at 10/12/21 0900   timolol (TIMOPTIC) 0.5 % ophthalmic solution 1 drop  1 drop Both Eyes QHS Rise Patience, MD   1 drop at 10/11/21 2223   warfarin (COUMADIN) tablet 10 mg  10 mg Oral ONCE-1600 [facility], [facility]       Warfarin - Pharmacist Dosing Inpatient   Does not apply q1600 [facility], [facility]         Discharge Medications: Please see discharge summary for a list of discharge medications.  Relevant Imaging Results:  Relevant Lab Results:   Additional Information SSN: [id number].  Pt is vaccinated for covid with both boosters.  Joanne Chars, LCSW

## 2021-10-12 NOTE — Progress Notes (Signed)
Pharmacy: Dofetilide (Tikosyn) - Follow Up Assessment and Electrolyte Replacement  Pharmacy consulted to assist in monitoring and replacing electrolytes in this 87 y.o. male admitted on 10/10/2021 undergoing dofetilide continuation of home dose. First dofetilide dose inpatient: 10/29 @2200   Labs:    Component Value Date/Time   K 4.0 10/12/2021 0443   MG 2.1 10/12/2021 0443     Plan: Potassium: K >/= 4: No additional supplementation needed  Magnesium: Mg > 2: No additional supplementation needed   Thank you for allowing pharmacy to participate in this patient's care   Cephus Slater, PharmD, [place] Resident [phone] 10/12/2021 8:11 AM

## 2021-10-12 NOTE — TOC Initial Note (Signed)
Transition of Care (TOC) - Initial/Assessment Note    Patient Details  Name: Nathaniel [name]. MRN: [id number] Date of Birth: February 08, 1932  Transition of Care [facility]) CM/SW Contact:    Joanne Chars, LCSW Phone Number: 10/12/2021, 12:46 PM  Clinical Narrative:  CSW met with pt and his granddaughter [name] in room to discuss recommendation for SNF.  Permission given to speak with granddaughter present and also to communicate with son Ulice Dash who is not local.  Pt asked questions about SNF, initially states he cannot go because he cannot be away from his day to day financial investing activities.  Pt reports he lives alone, has family support.  CSW reviewed PT recommendations with pt, discussed safe discharge options.  Granddaughter shared that she can provide support but not 24/7.  Fairly lengthy discussion, son Ulice Dash was texted by Granddaughter and his opinion was pt needs to go to SNF.  Eventually pt agreeable to have his referral faxed out, choice document given, pt and family will discuss any other options. Pt is vaccinated for covid with 2 boosters.                 Expected Discharge Plan:  (Pt has not fully agreed to SNF but is considering it) Barriers to Discharge: Continued Medical Work up, SNF Pending bed offer   Patient Goals and CMS Choice Patient states their goals for this hospitalization and ongoing recovery are:: be able to continue his financial investing every day CMS Medicare.gov Compare Post Acute Care list provided to:: Patient Represenative (must comment) Choice offered to / list presented to :  (granddaughter)  Expected Discharge Plan and Services Expected Discharge Plan:  (Pt has not fully agreed to SNF but is considering it) In-house Referral: Clinical Social Work   Post Acute Care Choice:  (TBD) Living arrangements for the past 2 months: Single Family Home                                      Prior Living Arrangements/Services Living arrangements for the  past 2 months: Single Family Home Lives with:: Self Patient language and need for interpreter reviewed:: Yes Do you feel safe going back to the place where you live?: Yes      Need for Family Participation in Patient Care: Yes (Comment) Care giver support system in place?: Yes (comment) Current home services: Other (comment) (none) Criminal Activity/Legal Involvement Pertinent to Current Situation/Hospitalization: No - Comment as needed  Activities of Daily Living Home Assistive Devices/Equipment: Cane (specify quad or straight), CBG Meter, Shower chair with back ADL Screening (condition at time of admission) Patient's cognitive ability adequate to safely complete daily activities?: Yes Is the patient deaf or have difficulty hearing?: No Does the patient have difficulty seeing, even when wearing glasses/contacts?: No Does the patient have difficulty concentrating, remembering, or making decisions?: No Patient able to express need for assistance with ADLs?: Yes Does the patient have difficulty dressing or bathing?: No Independently performs ADLs?: Yes (appropriate for developmental age) Does the patient have difficulty walking or climbing stairs?: Yes Weakness of Legs: Left Weakness of Arms/Hands: None  Permission Sought/Granted Permission sought to share information with : Family Supports Permission granted to share information with : Yes, Verbal Permission Granted  Share Information with NAME: granddaughter [name], son Ulice Dash  Permission granted to share info w AGENCY: SNF        Emotional Assessment Appearance:: Appears  stated age Attitude/Demeanor/Rapport: Engaged Affect (typically observed): Appropriate Orientation: : Oriented to Self, Oriented to Place, Oriented to  Time, Oriented to Situation Alcohol / Substance Use: Not Applicable Psych Involvement: No (comment)  Admission diagnosis:  Rhabdomyolysis [M62.82] Urinary retention [R33.9] Acute cystitis without hematuria  [N30.00] Anticoagulated on Coumadin [Z79.01] Traumatic rhabdomyolysis, initial encounter ([place]) [T79.6XXA] Patient Active Problem List   Diagnosis Date Noted   Pressure injury of skin 10/11/2021   Urinary retention 10/11/2021   Acute cystitis without hematuria    Rhabdomyolysis 10/10/2021   Afib ([place]) 06/12/2019   Asymptomatic carotid artery stenosis without infarction, right 02/02/2018   Lung infiltrate 01/06/2018   Acute bronchiolitis due to human metapneumovirus 01/06/2018   Acute on chronic combined systolic and diastolic CHF (congestive heart failure) (HCC)    Cough 12/19/2017   Acute bronchitis 12/19/2017   Solitary pulmonary nodule 03/23/2016   Pulmonary nodule 12/25/2015   Aspiration of foreign body    Lung nodule < 6cm on CT    Foreign body aspiration 12/23/2015   Encounter for therapeutic drug monitoring 93/10/2161   Chronic systolic CHF (congestive heart failure) ([place]) 06/12/2014   Chronic diastolic CHF (congestive heart failure) ([place]) 04/23/2014   Atrial fibrillation with rapid ventricular response ([place]) 04/20/2014   AAA (abdominal aortic aneurysm) without rupture 04/13/2014   Occlusion and stenosis of carotid artery without mention of cerebral infarction 04/09/2014   CAD (coronary artery disease) of artery bypass graft 09/14/2012   Abdominal aortic aneurysm ([place]) 09/14/2012   Peripheral vascular disease with claudication ([place]) 09/14/2012   Carotid artery disease without cerebral infarction ([place]) 09/14/2012   History of aortic valve replacement with bioprosthetic valve 09/14/2012   Essential hypertension, malignant 09/14/2012   Hyperlipidemia with target LDL less than 70 09/14/2012   ABDOMINAL AORTIC ANEURYSM 07/06/2009   CEREBROVASCULAR DISEASE, HX OF 07/06/2009   PCP:  Lorne Skeens, MD Pharmacy:   [facility], [place] - [address] [place] [address] Phone: [phone] Fax: [phone]     Social Determinants of  Health (SDOH) Interventions    Readmission Risk Interventions No flowsheet data found.

## 2021-10-13 DIAGNOSIS — T796XXA Traumatic ischemia of muscle, initial encounter: Secondary | ICD-10-CM | POA: Diagnosis not present

## 2021-10-13 DIAGNOSIS — I480 Paroxysmal atrial fibrillation: Secondary | ICD-10-CM | POA: Diagnosis not present

## 2021-10-13 DIAGNOSIS — M6282 Rhabdomyolysis: Secondary | ICD-10-CM

## 2021-10-13 DIAGNOSIS — I5033 Acute on chronic diastolic (congestive) heart failure: Secondary | ICD-10-CM | POA: Diagnosis not present

## 2021-10-13 LAB — BASIC METABOLIC PANEL
Anion gap: 7 (ref 5–15)
BUN: 16 mg/dL (ref 8–23)
CO2: 26 mmol/L (ref 22–32)
Calcium: 8.2 mg/dL — ABNORMAL LOW (ref 8.9–10.3)
Chloride: 102 mmol/L (ref 98–111)
Creatinine, Ser: 1.1 mg/dL (ref 0.61–1.24)
GFR, Estimated: >60 mL/min (ref >60)
Glucose, Bld: 158 mg/dL — ABNORMAL HIGH (ref 70–99)
Potassium: 3.7 mmol/L (ref 3.5–5.1)
Sodium: 135 mmol/L (ref 135–145)

## 2021-10-13 LAB — GLUCOSE, CAPILLARY
Glucose-Capillary: 128 mg/dL — ABNORMAL HIGH (ref 70–99)
Glucose-Capillary: 166 mg/dL — ABNORMAL HIGH (ref 70–99)
Glucose-Capillary: 194 mg/dL — ABNORMAL HIGH (ref 70–99)
Glucose-Capillary: 170 mg/dL — ABNORMAL HIGH (ref 70–99)
Glucose-Capillary: 187 mg/dL — ABNORMAL HIGH (ref 70–99)

## 2021-10-13 LAB — CBC
HCT: 37.9 % — ABNORMAL LOW (ref 39.0–52.0)
Hemoglobin: 12.4 g/dL — ABNORMAL LOW (ref 13.0–17.0)
MCH: 30.2 pg (ref 26.0–34.0)
MCHC: 32.7 g/dL (ref 30.0–36.0)
MCV: 92.4 fL (ref 80.0–100.0)
Platelets: 143 10*3/uL — ABNORMAL LOW (ref 150–400)
RBC: 4.1 MIL/uL — ABNORMAL LOW (ref 4.22–5.81)
RDW: 12.8 % (ref 11.5–15.5)
WBC: 7.6 10*3/uL (ref 4.0–10.5)
nRBC: 0 % (ref 0.0–0.2)

## 2021-10-13 LAB — URINE CULTURE: Culture: >=100000 — AB

## 2021-10-13 LAB — PROTIME-INR
INR: 1.6 — ABNORMAL HIGH (ref 0.8–1.2)
Prothrombin Time: 18.6 seconds — ABNORMAL HIGH (ref 11.4–15.2)

## 2021-10-13 LAB — CK
Total CK: 5152 U/L — ABNORMAL HIGH (ref 49–397)
Total CK: 4524 U/L — ABNORMAL HIGH (ref 49–397)

## 2021-10-13 LAB — MAGNESIUM: Magnesium: 1.9 mg/dL (ref 1.7–2.4)

## 2021-10-13 MED ORDER — TIMOLOL MALEATE 0.5 % OP SOLN: 1.0000 [drp] | Freq: Every day | OPHTHALMIC | Status: DC

## 2021-10-13 MED ORDER — POTASSIUM CHLORIDE CRYS ER 20 MEQ PO TBCR
40.0000 meq | EXTENDED_RELEASE_TABLET | Freq: Two times a day (BID) | ORAL | Status: DC
  Administered 2021-10-13 – 2021-10-14 (×3): 40 meq via ORAL
  Filled 2021-10-13 (×3): qty 2

## 2021-10-13 MED ORDER — WARFARIN SODIUM 5 MG PO TABS
7.0000 mg | ORAL_TABLET | Freq: Once | ORAL | Status: AC
  Administered 2021-10-13: 7 mg via ORAL
  Filled 2021-10-13: qty 1

## 2021-10-13 MED ORDER — MAGNESIUM SULFATE 2 GM/50ML IV SOLN
2.0000 g | Freq: Once | INTRAVENOUS | Status: AC
  Administered 2021-10-13: 2 g via INTRAVENOUS
  Filled 2021-10-13: qty 50

## 2021-10-13 MED ORDER — SPIRONOLACTONE 25 MG PO TABS
25.0000 mg | ORAL_TABLET | Freq: Every day | ORAL | Status: DC
  Administered 2021-10-13 – 2021-10-14 (×2): 25 mg via ORAL
  Filled 2021-10-13 (×2): qty 1

## 2021-10-13 MED ORDER — MAGNESIUM OXIDE -MG SUPPLEMENT 400 (240 MG) MG PO TABS
800.0000 mg | ORAL_TABLET | Freq: Two times a day (BID) | ORAL | Status: AC
  Administered 2021-10-13: 800 mg via ORAL
  Filled 2021-10-13 (×2): qty 2

## 2021-10-13 MED ORDER — METOPROLOL SUCCINATE ER 25 MG PO TB24
25.0000 mg | ORAL_TABLET | Freq: Every day | ORAL | Status: DC
  Administered 2021-10-14: 25 mg via ORAL
  Filled 2021-10-13: qty 1

## 2021-10-13 MED ORDER — FUROSEMIDE 40 MG PO TABS
40.0000 mg | ORAL_TABLET | Freq: Every day | ORAL | Status: DC
  Administered 2021-10-13 – 2021-10-14 (×2): 40 mg via ORAL
  Filled 2021-10-13 (×2): qty 1

## 2021-10-13 NOTE — Progress Notes (Signed)
Spoke with Jonni Sanger with EP. Advised okay to administer Tikosyn this morning.

## 2021-10-13 NOTE — Progress Notes (Signed)
[place] [place] for warfarin Indication: atrial fibrillation  Allergies  Allergen Reactions   Grass Extracts [Gramineae Pollens] Other (See Comments)    Sneezing and runny nose   Milk-Related Compounds Other (See Comments)    Just Yogurt.  Makes him very lethargic.   Amiodarone Anxiety    depression   Patient Measurements: Height: 5\' 10"  (177.8 cm) Weight: 90 kg (198 lb 6.6 oz) IBW/kg (Calculated) : 73  Vital Signs: Temp: 98.4 F (36.9 C) (10/31 0413) Temp Source: Oral (10/31 0413) BP: 140/63 (10/31 0841) Pulse Rate: 66 (10/31 0841)  Labs: Recent Labs    10/10/21 2128 10/11/21 0847 10/11/21 1709 10/12/21 0443 10/12/21 1623 10/13/21 0454  HGB 15.2 13.5  --  12.4*  --  12.4*  HCT 45.9 39.9  --  38.8*  --  37.9*  PLT 162 147*  --  125*  --  143*  LABPROT 19.4*  --   --  16.3*  --  18.6*  INR 1.6*  --   --  1.3*  --  1.6*  CREATININE 1.09 1.07 1.24 1.11  --  1.10  CKTOTAL 12,793* 6,282* 7,754* 8,225* 7,535* 5,152*     Estimated Creatinine Clearance: 53.4 mL/min (by C-G formula based on SCr of 1.1 mg/dL).   Medical History: Past Medical History:  Diagnosis Date   AAA (abdominal aortic aneurysm)    Atrial fibrillation (HCC)    Atrial fibrillation (HCC)    CAD (coronary artery disease)    Cataract    CHF (congestive heart failure) (HCC)    Chronic kidney disease    Colon polyps    adenomatous   Diabetes mellitus    Diverticulosis    Dysrhythmia    A-Fib   GERD (gastroesophageal reflux disease)    Gout    Hernia, abdominal    History of aortic valve replacement with bioprosthetic valve    2007   Hx: UTI (urinary tract infection)    Hyperlipemia    Hypertension    Internal hemorrhoids    Peripheral vascular disease (HCC)    Shortness of breath    with exertion    Medications:  Medications Prior to Admission  Medication Sig Dispense Refill Last Dose   allopurinol (ZYLOPRIM) 300 MG tablet Take 300 mg by mouth daily.    2 10/09/2021   atorvastatin (LIPITOR) 10 MG tablet TAKE ONE TABLET DAILY (Patient taking differently: Take 10 mg by mouth daily.) 30 tablet 11 10/09/2021   atorvastatin (LIPITOR) 20 MG tablet Take 20 mg by mouth every other day. Along with 10 mg to total 30 mg on alternate days   10/08/2021   atorvastatin (LIPITOR) 20 MG tablet Take 20 mg by mouth daily.   10/09/2021   Coenzyme Q10 (CO Q10) 200 MG CAPS Take 200 mg by mouth daily.   10/09/2021   dofetilide (TIKOSYN) 250 MCG capsule TAKE ONE CAPSULE TWICE A DAY (Patient taking differently: Take 250 mcg by mouth 2 (two) times daily.) 60 capsule 11 10/09/2021   empagliflozin (JARDIANCE) 10 MG TABS tablet Take 10 mg by mouth daily.   10/08/2021   Evolocumab (REPATHA SURECLICK) 496 MG/ML SOAJ Inject 1 pen into the skin every 14 (fourteen) days. (Patient taking differently: Inject 140 mg into the skin every 14 (fourteen) days.) 2 mL 11 10/08/2021   ezetimibe (ZETIA) 10 MG tablet Take 1 tablet (10 mg total) by mouth at bedtime. 30 tablet 3 10/08/2021   furosemide (LASIX) 20 MG tablet Take 20-40 mg by  mouth as directed. Rotating every other day; 20mg  one day and 40mg  the next day   10/09/2021   latanoprost (XALATAN) 0.005 % ophthalmic solution Place 1 drop into both eyes daily.   10/09/2021   Metoprolol Succinate 25 MG CS24 Take 25 mg by mouth 2 (two) times daily.   10/09/2021 at 9 am   Misc Natural Products (GLUCOSAMINE CHONDROITIN TRIPLE) TABS Take 1 tablet by mouth daily.   10/08/2021   ramipril (ALTACE) 2.5 MG capsule Take 1 capsule (2.5 mg total) by mouth 2 (two) times daily. (Patient taking differently: Take 5 mg by mouth daily.) 90 capsule 3 10/09/2021   spironolactone (ALDACTONE) 25 MG tablet TAKE ONE TABLET DAILY (Patient taking differently: Take 25 mg by mouth daily.) 30 tablet 3 10/08/2021   tamsulosin (FLOMAX) 0.4 MG CAPS capsule Take 0.4 mg by mouth 2 (two) times daily.   10/09/2021   timolol (TIMOPTIC) 0.5 % ophthalmic solution Place 1 drop  into both eyes at bedtime.  5 10/09/2021   warfarin (COUMADIN) 3 MG tablet TAKE 3 TABLETS DAILY EXCEPT 2 TABLETS ONMONDAY AND FRIDAY OR AS DIRECTED BY CLINIC (Patient taking differently: Take 6-9 mg by mouth See admin instructions. Takes 6 mg on Monday and Friday ,then 9 mg on all other days) 90 tablet 2 10/09/2021 at 9 am    Assessment: 31 yom with PMH significant for CAD s/p CABG, bioprosthetic aortic valve, afib, PVD, and DM. Patient presents s/p fall at home, no loss of consciousness or head injury. Patient on warfarin PTA.   PTA Dosing: 6MG  MF and 9MG  AOD (Weekly Total 57 MG)   INR today is subtherapeutic at 1.6 but trending up - will give slight boosted dose and hopefully resume home regimen tomorrow.  Goal of Therapy:  INR 2-3 Monitor platelets by anticoagulation protocol: Yes   Plan:  Warfarin 7mg  x1 today  Daily INR  Arrie Senate, PharmD, BCPS, [facility] Clinical Pharmacist [phone] Please check AMION for all [place] numbers 10/13/2021

## 2021-10-13 NOTE — TOC Progression Note (Signed)
Transition of Care (TOC) - Progression Note    Patient Details  Name: Nathaniel [name]. MRN: [id number] Date of Birth: 30-Dec-1931  Transition of Care [facility]) CM/SW [place], [place] Phone Number: 10/13/2021, 11:30 AM  Clinical Narrative:     CSW met with patient at bedside and provided SNF bed offers. Patient accepted Medicare ratings list with accepted SNF bed offers to review. Patient currently wants to speak with his son to see if he is able to provide 24/7 supervision at home. If not, patient chose SNF placement at [facility]. [facility] confirmed they could accept patient for SNF tomorrow if medically ready. CSW informed patient. Patient confirmed he will call CSW after speaking with son to confirm dc plan. Home with son vrs. SNF. CSW will continue to follow and assist with patients dc planning needs.  Expected Discharge Plan:  (Pt has not fully agreed to SNF but is considering it) Barriers to Discharge: Continued Medical Work up, SNF Pending bed offer  Expected Discharge Plan and Services Expected Discharge Plan:  (Pt has not fully agreed to SNF but is considering it) In-house Referral: Clinical Social Work   Post Acute Care Choice:  (TBD) Living arrangements for the past 2 months: Single Family Home                                       Social Determinants of Health (SDOH) Interventions    Readmission Risk Interventions No flowsheet data found.

## 2021-10-13 NOTE — Progress Notes (Addendum)
Notified by RN patient bradycardic. Asymptomatic.  Reviewed telemetry. Rhythm sinus bradycardia, rate upper 40s - low 50s  Will reduce metoprolol succinate from 25 mg BID to 25 mg daily

## 2021-10-13 NOTE — Progress Notes (Signed)
PT Cancellation Note  Patient Details Name: Nathaniel [name]. MRN: [id number] DOB: [birth year]   Cancelled Treatment:    Reason Eval/Treat Not Completed: Other (comment).  Pt is unable to tolerate therapy, declined from fatigue after walking with mobility tech.  Follow up as time and pt allow.   Ramond Dial 10/13/2021, 4:24 PM  Mee Hives, PT PhD Acute Rehab Dept. Number: [place] and [place]

## 2021-10-13 NOTE — Progress Notes (Signed)
Morning EKG reviewed  EKG reviewed  Shows remains in NSR with stable QTc at ~440-450 ms.  Continue Tikosyn 250 mcg BID.   Pt will not require monitoring pass 11/1 am dose if QTc remains stable.     Shirley Friar, PA-C  Pager: [phone]  10/13/2021 1:15 PM

## 2021-10-13 NOTE — Progress Notes (Signed)
PROGRESS NOTE    Deiontae Rabel [name].  OVF:[id number] DOB: 08-03-1932 DOA: 10/10/2021 PCP: Lorne Skeens, MD    Brief Narrative:  87 year old gentleman with known history of coronary artery disease status post CABG, bioprosthetic aortic valve, paroxysmal atrial fibrillation on Tikosyn status post cardioversion, diabetes, peripheral vascular disease and BPH presented with fall at home, 6 hours on the floor.  He was found to have rhabdomyolysis with CK level of more than 12,000, WBC count 17.5.  Missed dose of Coumadin with INR 1.6 and missed doses of Tikosyn.  Patient admitted to the hospital with rhabdomyolysis, missed Tikosyn doses and acute urinary retention.   Assessment & Plan:   Principal Problem:   Rhabdomyolysis Active Problems:   Essential hypertension, malignant   Chronic diastolic CHF (congestive heart failure) (HCC)   Pressure injury of skin   Urinary retention   Acute cystitis without hematuria  Acute traumatic rhabdomyolysis: Clinically improving.  Urine output is adequate.  Renal functions are adequate.  Treated with IV fluids.  Now with some fluid overload.  Discontinuing IV fluids. Intake and output monitoring.  CK levels are trending down.  As per cardiology suggestion, discontinuing IV fluids.  Paroxysmal atrial fibrillation: Treated with cardioversion and subsequently on Tikosyn.  Patient on Coumadin.  Missed 3-4 doses of Tikosyn. Started with reloading protocol, followed by cardiology and pharmacy.  Electrolytes are adequate. Currently in sinus rhythm. On Coumadin. Tolerating Tikosyn so far.  Cardiology recommended monitoring until dose for 11/1.  Acute urinary retention with history of BPH: Foley catheter placed while in the ER.  He does have history of prostatism and urinary retention. Patient insisted on voiding trial, Foley catheter removed 10/30.  He has significant retention.  Straight cath today, if needed for more than 3 times, reinsert Foley catheter  and plan to discharge with Foley catheter with outpatient follow-up.  Flomax 4 mg twice a day.   Chronic diastolic heart failure: Noted to be shortness of breath on mobility.  IV fluid discontinued.  Started back on Lasix and Aldactone.    Essential hypertension: Blood pressures fairly stable at this time.  Physical deconditioning and frailty: Work with PT OT.  Acute UTI present on admission: Urine culture growing E. coli.  Sensitive to Rocephin.  Continue IV Rocephin while in the hospital.  Will treat as complicated UTI with Keflex for 2 weeks.  DVT prophylaxis:  warfarin (COUMADIN) tablet 7 mg   Code Status: Full code Family Communication: Granddaughter on phone 10/30. Disposition Plan: Status is: Inpatient  Remains inpatient appropriate because: High risk medication adjustment, monitoring of toxicity.  IV fluids.         Consultants:  Cardiology  Procedures:  None  Antimicrobials:  Rocephin 10/28---   Subjective: Patient seen and examined.  No overnight events.  Remains sinus rhythm.  EKG is stable.  Feels short of breath on mobility.  Denies any chest pain or shortness of breath at rest.  Denies any palpitations.  Pain He still feels some retention of urine after urinating after removal of catheter.  Objective: Vitals:   10/13/21 0001 10/13/21 0413 10/13/21 0841 10/13/21 1229  BP: (!) 119/49 (!) 145/61 140/63 (!) 163/77  Pulse: 60 65 66 61  Resp:  18  18  Temp: 98.5 F (36.9 C) 98.4 F (36.9 C)  98 F (36.7 C)  TempSrc: Oral Oral  Oral  SpO2: 95% 92%  99%  Weight:  90 kg    Height:        Intake/Output  Summary (Last 24 hours) at 10/13/2021 1313 Last data filed at 10/13/2021 1245 Gross per 24 hour  Intake 2228.47 ml  Output 950 ml  Net 1278.47 ml   Filed Weights   10/10/21 1909 10/13/21 0413  Weight: 89.4 kg 90 kg    Examination:  General exam: Appears calm and comfortable  Slightly anxious. Respiratory system: Clear to auscultation.  Respiratory effort normal. Cardiovascular system: S1 & S2 heard, RRR. No JVD, murmurs, rubs, gallops or clicks.  Chronic stasis changes on the legs. Gastrointestinal system: Abdomen is nondistended, soft and nontender. No organomegaly or masses felt. Normal bowel sounds heard. Central nervous system: Alert and oriented. No focal neurological deficits. Extremities: Symmetric 5 x 5 power. Chronic venous stasis changes both legs.  No edema.   Data Reviewed: I have personally reviewed following labs and imaging studies  CBC: Recent Labs  Lab 10/10/21 2128 10/11/21 0847 10/12/21 0443 10/13/21 0454  WBC 17.5* 16.0* 12.6* 7.6  NEUTROABS 15.2* 13.0*  --   --   HGB 15.2 13.5 12.4* 12.4*  HCT 45.9 39.9 38.8* 37.9*  MCV 91.8 92.1 93.7 92.4  PLT 162 147* 125* 053*   Basic Metabolic Panel: Recent Labs  Lab 10/10/21 2128 10/11/21 0629 10/11/21 0847 10/11/21 1709 10/12/21 0443 10/13/21 0454  NA 135  --  134* 134* 134* 135  K 4.1  --  4.0 4.0 4.0 3.7  CL 99  --  102 101 103 102  CO2 28  --  25 27 22 26   GLUCOSE 128*  --  127* 146* 110* 158*  BUN 15  --  13 13 16 16   CREATININE 1.09  --  1.07 1.24 1.11 1.10  CALCIUM 9.1  --  8.3* 8.2* 8.2* 8.2*  MG  --  1.8  --  2.4 2.1 1.9   GFR: Estimated Creatinine Clearance: 53.4 mL/min (by C-G formula based on SCr of 1.1 mg/dL). Liver Function Tests: Recent Labs  Lab 10/10/21 2128 10/11/21 0847  AST 265* 203*  ALT 56* 50*  ALKPHOS 58 58  BILITOT 2.6* 2.3*  PROT 6.9 5.7*  ALBUMIN 4.0 3.0*   No results for input(s): LIPASE, AMYLASE in the last 168 hours. No results for input(s): AMMONIA in the last 168 hours. Coagulation Profile: Recent Labs  Lab 10/09/21 0852 10/10/21 2128 10/12/21 0443 10/13/21 0454  INR 2.6 1.6* 1.3* 1.6*   Cardiac Enzymes: Recent Labs  Lab 10/11/21 0847 10/11/21 1709 10/12/21 0443 10/12/21 1623 10/13/21 0454  CKTOTAL 6,282* 7,754* 8,225* 7,535* 5,152*   BNP (last 3 results) No results for input(s):  PROBNP in the last 8760 hours. HbA1C: No results for input(s): HGBA1C in the last 72 hours. CBG: Recent Labs  Lab 10/12/21 1147 10/12/21 1647 10/12/21 2106 10/13/21 0736 10/13/21 1145  GLUCAP 128* 181* 156* 128* 166*   Lipid Profile: No results for input(s): CHOL, HDL, LDLCALC, TRIG, CHOLHDL, LDLDIRECT in the last 72 hours. Thyroid Function Tests: No results for input(s): TSH, T4TOTAL, FREET4, T3FREE, THYROIDAB in the last 72 hours. Anemia Panel: No results for input(s): VITAMINB12, FOLATE, FERRITIN, TIBC, IRON, RETICCTPCT in the last 72 hours. Sepsis Labs: No results for input(s): PROCALCITON, LATICACIDVEN in the last 168 hours.  Recent Results (from the past 240 hour(s))  Urine Culture     Status: Abnormal   Collection Time: 10/10/21  9:16 PM   Specimen: Urine, Catheterized  Result Value Ref Range Status   Specimen Description   Final    URINE, CATHETERIZED Performed at [facility], [address], [place], [place] [address]    Special Requests   Final    NONE Performed at [facility], [address], [place], [place] [address]    Culture >=100,000 COLONIES/mL ESCHERICHIA COLI (A)  Final   Report Status 10/13/2021 FINAL  Final   Organism ID, Bacteria ESCHERICHIA COLI (A)  Final      Susceptibility   Escherichia coli - MIC*    AMPICILLIN >=32 RESISTANT Resistant     CEFAZOLIN >=64 RESISTANT Resistant     CEFEPIME <=0.12 SENSITIVE Sensitive     CEFTRIAXONE <=0.25 SENSITIVE Sensitive     CIPROFLOXACIN <=0.25 SENSITIVE Sensitive     GENTAMICIN <=1 SENSITIVE Sensitive     IMIPENEM <=0.25 SENSITIVE Sensitive     NITROFURANTOIN <=16 SENSITIVE Sensitive     TRIMETH/SULFA <=20 SENSITIVE Sensitive     AMPICILLIN/SULBACTAM >=32 RESISTANT Resistant     PIP/TAZO <=4 SENSITIVE Sensitive     * >=100,000 COLONIES/mL ESCHERICHIA COLI  Resp Panel by RT-PCR (Flu A&B, Covid) Urine, Clean Catch     Status: None   Collection Time: 10/11/21  12:44 AM   Specimen: Urine, Clean Catch; Nasopharyngeal(NP) swabs in vial transport medium  Result Value Ref Range Status   SARS Coronavirus 2 by RT PCR NEGATIVE NEGATIVE Final    Comment: (NOTE) SARS-CoV-2 target nucleic acids are NOT DETECTED.  The SARS-CoV-2 RNA is generally detectable in upper respiratory specimens during the acute phase of infection. The lowest concentration of SARS-CoV-2 viral copies this assay can detect is 138 copies/mL. A negative result does not preclude SARS-Cov-2 infection and should not be used as the sole basis for treatment or other patient management decisions. A negative result may occur with  improper specimen collection/handling, submission of specimen other than nasopharyngeal swab, presence of viral mutation(s) within the areas targeted by this assay, and inadequate number of viral copies(<138 copies/mL). A negative result must be combined with clinical observations, patient history, and epidemiological information. The expected result is Negative.  Fact Sheet for Patients:  EntrepreneurPulse.com.au  Fact Sheet for Healthcare Providers:  IncredibleEmployment.be  This test is no t yet approved or cleared by the [place] FDA and  has been authorized for detection and/or diagnosis of SARS-CoV-2 by FDA under an Emergency Use Authorization (EUA). This EUA will remain  in effect (meaning this test can be used) for the duration of the COVID-19 declaration under Section 564(b)(1) of the Act, 21 U.S.C.section 360bbb-3(b)(1), unless the authorization is terminated  or revoked sooner.       Influenza A by PCR NEGATIVE NEGATIVE Final   Influenza B by PCR NEGATIVE NEGATIVE Final    Comment: (NOTE) The Xpert Xpress SARS-CoV-2/FLU/RSV plus assay is intended as an aid in the diagnosis of influenza from Nasopharyngeal swab specimens and should not be used as a sole basis for treatment. Nasal washings and aspirates  are unacceptable for Xpert Xpress SARS-CoV-2/FLU/RSV testing.  Fact Sheet for Patients: EntrepreneurPulse.com.au  Fact Sheet for Healthcare Providers: IncredibleEmployment.be  This test is not yet approved or cleared by the [place] FDA and has been authorized for detection and/or diagnosis of SARS-CoV-2 by FDA under an Emergency Use Authorization (EUA). This EUA will remain in effect (meaning this test can be used) for the duration of the COVID-19 declaration under Section 564(b)(1) of the Act, 21 U.S.C. section 360bbb-3(b)(1), unless the authorization is terminated or revoked.  Performed at [facility], [address], [place], [place] [address]  Radiology Studies: No results found.      Scheduled Meds:  Chlorhexidine Gluconate Cloth  6 each Topical Daily   dofetilide  250 mcg Oral BID   ezetimibe  10 mg Oral QHS   furosemide  40 mg Oral Daily   insulin aspart  0-9 Units Subcutaneous TID WC   latanoprost  1 drop Both Eyes QHS   magnesium oxide  800 mg Oral BID   metoprolol succinate  25 mg Oral BID   potassium chloride  40 mEq Oral BID   sodium chloride flush  3 mL Intravenous Q12H   spironolactone  25 mg Oral Daily   tamsulosin  0.4 mg Oral BID   timolol  1 drop Both Eyes QHS   warfarin  7 mg Oral ONCE-1600   Warfarin - Pharmacist Dosing Inpatient   Does not apply q1600   Continuous Infusions:  sodium chloride     cefTRIAXone (ROCEPHIN)  IV 1 g (10/12/21 2051)     LOS: 2 days    Time spent: 30 minutes    Barb Merino, MD [facility] Pager [phone]

## 2021-10-13 NOTE — Progress Notes (Signed)
Spoke with CHF on call and Nathaniel [name] with EP concerning heart rate dropping into the 40's and 50's. Pt was asymptomatic and I was advised to continue to monitor. Advised by Nathaniel [name] that we may ultimately need to decrease Coreg but would see what HR is at time it is due.

## 2021-10-13 NOTE — Progress Notes (Signed)
Pharmacy: Dofetilide (Tikosyn) - Follow Up Assessment and Electrolyte Replacement  Pharmacy consulted to assist in monitoring and replacing electrolytes in this 87 y.o. male admitted on 10/10/2021 undergoing dofetilide continuation of home dose. First dofetilide dose inpatient: 10/29 @2200   Labs:    Component Value Date/Time   K 3.7 10/13/2021 0454   MG 1.9 10/13/2021 0454     Plan: Potassium: K 3.5-3.7:  Give KCl 60 mEq po x1  - 72mEq x2 ordered by MD  Magnesium: Mg 1.8-2: Give Mg 2 gm IV x1    Thank you for allowing pharmacy to participate in this patient's care   Arrie Senate, PharmD, BCPS, [facility] Clinical Pharmacist [phone] Please check AMION for all [place] numbers 10/13/2021

## 2021-10-13 NOTE — Progress Notes (Signed)
Mobility Specialist Progress Note    10/13/21 1229  Mobility  Activity Ambulated in hall  Level of Assistance +2 (takes two people) (chair follow)  Assistive Device [employer] Ambulated (ft) 105 ft  Mobility Ambulated with assistance in hallway  Mobility Response Tolerated fair  Mobility performed by Mobility specialist  $Mobility charge 1 Mobility   Pre-Mobility: 60 HR, 120/51 BP During Mobility: 91% SpO2 Post-Mobility: 66 HR, 163/77 BP  Pt received in bed and agreeable. Became progressively SOB and had 2 LOB from kicking his cane. Encouraged pursed lip breathing but decided to turn around and ride back in chair because of SOB. RN was notified. Returned to bed with call bell in reach. Pt stated he did not feel tired just like he was breathing hard.   Hildred Alamin Mobility Specialist  Mobility Specialist Phone: [phone]

## 2021-10-13 NOTE — Consult Note (Addendum)
ELECTROPHYSIOLOGY CONSULT NOTE    Patient ID: Nathaniel [name]. MRN: [id number], DOB/AGE: 07/28/1932 87 y.o.  Admit date: 10/10/2021 Date of Consult: 10/13/2021  Primary Physician: Lorne Skeens, MD Primary Cardiologist: None  Electrophysiologist: Dr. Curt Bears  Referring Provider: Dr. Gasper Sells  Patient Profile: Nathaniel [name]. is a 87 y.o. male with a history of CAD s/p CABG  and prior redo CABG with LIMA-LAD was patent, sequential SVG-OM/ramus/D patent to ramus SVG-PDA was patent with 50-60% mid-graft stenosis;  PAD, AAA s/p repair 2015, CAS and HFmrEF 45-50%.  HX of AF with 7 DCCV since 2015 and 3 since Tikosyn initiation in 2021. who is being seen today for the evaluation of AF and tikosyn resumption at the request of Dr. Gasper Sells.  HPI:  Nathaniel Maffeo. is a 87 y.o. male with medical history as above.   He presented to [name] 10/28 after a fall on Thursday. Around 4:30 PM when to urinate and fell in his kitchen before he could reach the restroom. No aura, notse this was a mechanical fall where he caught his leg on something.  No LOC.  Took him 6 hours to get to a phone to call for help.  He denied CP, SOB, orthopnea, or PND. No bendopnea, weight gain, leg swelling, or abdominal swelling. He did notice weakness and some lethargy prior to the fall. He denies syncope or near syncope.   In the ED started on IVF, Ceftriaxone and a foley catheter for Rhabdo, +UA, and urinary retention. Pertinent labs included K of 4, normal creatinine, CK 12,793-> 6,282. UA with large glucose moderate leuk, hazy, with many bacteria and WBCs. During the coarse of his fall and his admission, He missed 4 doses of Tikosyn 250 mcg BID, so cardiology was consulted for assistance restarting.   This am, pt is to receive his 4th dose of restarting Tikosyn. EKGs thus far have been stable. K 3.7, Mg 1.9. Supp ordered. Pt is feeling well this am.   CK down to 5152. Cr stable at 1.1  Past Medical  History:  Diagnosis Date   AAA (abdominal aortic aneurysm)    Atrial fibrillation (HCC)    Atrial fibrillation (HCC)    CAD (coronary artery disease)    Cataract    CHF (congestive heart failure) (HCC)    Chronic kidney disease    Colon polyps    adenomatous   Diabetes mellitus    Diverticulosis    Dysrhythmia    A-Fib   GERD (gastroesophageal reflux disease)    Gout    Hernia, abdominal    History of aortic valve replacement with bioprosthetic valve    2007   Hx: UTI (urinary tract infection)    Hyperlipemia    Hypertension    Internal hemorrhoids    Peripheral vascular disease (HCC)    Shortness of breath    with exertion     Surgical History:  Past Surgical History:  Procedure Laterality Date   ABDOMINAL AORTIC ENDOVASCULAR STENT GRAFT N/A 04/13/2014   Procedure: ABDOMINAL AORTIC ENDOVASCULAR STENT GRAFT;  Surgeon: Serafina Mitchell, MD;  Location: [place];  Service: Vascular;  Laterality: N/A;   ANGIOPLASTY     Unsure if he had stents put in    [place] N/A 06/06/2014   Procedure: CARDIOVERSION;  Surgeon: Larey Dresser, MD;  Location: [place];  Service: Cardiovascular;  Laterality: N/A;   CARDIOVERSION N/A 08/21/2014  Procedure: CARDIOVERSION;  Surgeon: Larey Dresser, MD;  Location: [place];  Service: Cardiovascular;  Laterality: N/A;   CARDIOVERSION N/A 12/22/2017   Procedure: CARDIOVERSION;  Surgeon: Larey Dresser, MD;  Location: [facility] ENDOSCOPY;  Service: Cardiovascular;  Laterality: N/A;   CARDIOVERSION N/A 05/25/2019   Procedure: CARDIOVERSION;  Surgeon: Larey Dresser, MD;  Location: [place];  Service: Cardiovascular;  Laterality: N/A;   CARDIOVERSION N/A 06/14/2019   Procedure: CARDIOVERSION;  Surgeon: Jerline Pain, MD;  Location: [facility] ENDOSCOPY;  Service: Cardiovascular;  Laterality: N/A;   CARDIOVERSION N/A 12/12/2020   Procedure: CARDIOVERSION;  Surgeon: Larey Dresser, MD;  Location: [facility]  ENDOSCOPY;  Service: Cardiovascular;  Laterality: N/A;   CARDIOVERSION N/A 01/29/2021   Procedure: CARDIOVERSION;  Surgeon: Larey Dresser, MD;  Location: [place];  Service: Cardiovascular;  Laterality: N/A;   CORONARY ARTERY BYPASS GRAFT     ENDARTERECTOMY Right 02/02/2018   Procedure: ENDARTERECTOMY CAROTID RIGHT;  Surgeon: Serafina Mitchell, MD;  Location: [facility] OR;  Service: Vascular;  Laterality: Right;   EYE SURGERY Bilateral    cataract surgery   HERNIA REPAIR     2 operations   LEFT HEART CATHETERIZATION WITH CORONARY/GRAFT ANGIOGRAM N/A 07/10/2014   Procedure: LEFT HEART CATHETERIZATION WITH Beatrix Fetters;  Surgeon: Larey Dresser, MD;  Location: [facility] CATH LAB;  Service: Cardiovascular;  Laterality: N/A;   TEE WITHOUT CARDIOVERSION N/A 05/25/2019   Procedure: TRANSESOPHAGEAL ECHOCARDIOGRAM (TEE);  Surgeon: Larey Dresser, MD;  Location: [facility] ENDOSCOPY;  Service: Cardiovascular;  Laterality: N/A;   TEE WITHOUT CARDIOVERSION N/A 01/29/2021   Procedure: TRANSESOPHAGEAL ECHOCARDIOGRAM (TEE);  Surgeon: Larey Dresser, MD;  Location: [facility] ENDOSCOPY;  Service: Cardiovascular;  Laterality: N/A;   TONSILLECTOMY     VIDEO BRONCHOSCOPY Bilateral 12/25/2015   Procedure: VIDEO BRONCHOSCOPY WITHOUT FLUORO;  Surgeon: Javier Glazier, MD;  Location: [place];  Service: Cardiopulmonary;  Laterality: Bilateral;     Medications Prior to Admission  Medication Sig Dispense Refill Last Dose   allopurinol (ZYLOPRIM) 300 MG tablet Take 300 mg by mouth daily.   2 10/09/2021   atorvastatin (LIPITOR) 10 MG tablet TAKE ONE TABLET DAILY (Patient taking differently: Take 10 mg by mouth daily.) 30 tablet 11 10/09/2021   atorvastatin (LIPITOR) 20 MG tablet Take 20 mg by mouth every other day. Along with 10 mg to total 30 mg on alternate days   10/08/2021   atorvastatin (LIPITOR) 20 MG tablet Take 20 mg by mouth daily.   10/09/2021   Coenzyme Q10 (CO Q10) 200 MG CAPS Take 200 mg by mouth daily.    10/09/2021   dofetilide (TIKOSYN) 250 MCG capsule TAKE ONE CAPSULE TWICE A DAY (Patient taking differently: Take 250 mcg by mouth 2 (two) times daily.) 60 capsule 11 10/09/2021   empagliflozin (JARDIANCE) 10 MG TABS tablet Take 10 mg by mouth daily.   10/08/2021   Evolocumab (REPATHA SURECLICK) 888 MG/ML SOAJ Inject 1 pen into the skin every 14 (fourteen) days. (Patient taking differently: Inject 140 mg into the skin every 14 (fourteen) days.) 2 mL 11 10/08/2021   ezetimibe (ZETIA) 10 MG tablet Take 1 tablet (10 mg total) by mouth at bedtime. 30 tablet 3 10/08/2021   furosemide (LASIX) 20 MG tablet Take 20-40 mg by mouth as directed. Rotating every other day; 20mg  one day and 40mg  the next day   10/09/2021   latanoprost (XALATAN) 0.005 % ophthalmic solution Place 1 drop into both eyes daily.   10/09/2021   Metoprolol Succinate  25 MG CS24 Take 25 mg by mouth 2 (two) times daily.   10/09/2021 at 9 am   Misc Natural Products (GLUCOSAMINE CHONDROITIN TRIPLE) TABS Take 1 tablet by mouth daily.   10/08/2021   ramipril (ALTACE) 2.5 MG capsule Take 1 capsule (2.5 mg total) by mouth 2 (two) times daily. (Patient taking differently: Take 5 mg by mouth daily.) 90 capsule 3 10/09/2021   spironolactone (ALDACTONE) 25 MG tablet TAKE ONE TABLET DAILY (Patient taking differently: Take 25 mg by mouth daily.) 30 tablet 3 10/08/2021   tamsulosin (FLOMAX) 0.4 MG CAPS capsule Take 0.4 mg by mouth 2 (two) times daily.   10/09/2021   timolol (TIMOPTIC) 0.5 % ophthalmic solution Place 1 drop into both eyes at bedtime.  5 10/09/2021   warfarin (COUMADIN) 3 MG tablet TAKE 3 TABLETS DAILY EXCEPT 2 TABLETS ONMONDAY AND FRIDAY OR AS DIRECTED BY CLINIC (Patient taking differently: Take 6-9 mg by mouth See admin instructions. Takes 6 mg on Monday and Friday ,then 9 mg on all other days) 90 tablet 2 10/09/2021 at 9 am    Inpatient Medications:   Chlorhexidine Gluconate Cloth  6 each Topical Daily   dofetilide  250 mcg Oral BID    ezetimibe  10 mg Oral QHS   insulin aspart  0-9 Units Subcutaneous TID WC   latanoprost  1 drop Both Eyes QHS   magnesium oxide  800 mg Oral BID   metoprolol succinate  25 mg Oral BID   potassium chloride  40 mEq Oral BID   sodium chloride flush  3 mL Intravenous Q12H   tamsulosin  0.4 mg Oral BID   timolol  1 drop Both Eyes QHS   Warfarin - Pharmacist Dosing Inpatient   Does not apply q1600    Allergies:  Allergies  Allergen Reactions   Grass Extracts [Gramineae Pollens] Other (See Comments)    Sneezing and runny nose   Milk-Related Compounds Other (See Comments)    Just Yogurt.  Makes him very lethargic.   Amiodarone Anxiety    depression    Social History   Socioeconomic History   Marital status: Widowed    Spouse name: Not on file   Number of children: Not on file   Years of education: Not on file   Highest education level: Not on file  Occupational History   Occupation: [employer]   Tobacco Use   Smoking status: Former    Packs/day: 0.25    Years: 20.00    Pack years: 5.00    Types: Cigarettes    Quit date: 03/23/1992    Years since quitting: 29.5   Smokeless tobacco: Never  Vaping Use   Vaping Use: Never used  Substance and Sexual Activity   Alcohol use: Yes    Alcohol/week: 10.0 standard drinks    Types: 10 Glasses of wine per week    Comment: 1-2 drinks daily    Drug use: No   Sexual activity: Not Currently  Other Topics Concern   Not on file  Social History Narrative   1 caffeine drink daily    Social Determinants of Health   Financial Resource Strain: Not on file  Food Insecurity: Not on file  Transportation Needs: Not on file  Physical Activity: Not on file  Stress: Not on file  Social Connections: Not on file  Intimate Partner Violence: Not on file     Family History  Problem Relation Age of Onset   Heart disease Mother  Heart disease Father        before age 55   AAA (abdominal aortic aneurysm) Father    Colon cancer Neg Hx       Review of Systems: All other systems reviewed and are otherwise negative except as noted above.  Physical Exam: Vitals:   10/12/21 1434 10/12/21 2014 10/13/21 0001 10/13/21 0413  BP: (!) 127/49 132/69 (!) 119/49 (!) 145/61  Pulse: 61 71 60 65  Resp:  18  18  Temp: 98.7 F (37.1 C) 98.5 F (36.9 C) 98.5 F (36.9 C) 98.4 F (36.9 C)  TempSrc: Oral Oral Oral Oral  SpO2: 95% 97% 95% 92%  Weight:    90 kg  Height:        GEN- The patient is elderly appearing, alert and oriented x 3 today.   HEENT: normocephalic, atraumatic; sclera clear, conjunctiva pink; hearing intact; oropharynx clear; neck supple Lungs- Clear to ausculation bilaterally, normal work of breathing.  No wheezes, rales, rhonchi Heart- Regular rate and rhythm, no murmurs, rubs or gallops GI- soft, non-tender, non-distended, bowel sounds present Extremities- no clubbing, cyanosis, or edema; DP/PT/radial pulses 2+ bilaterally MS- no significant deformity or atrophy Skin- warm and dry, no rash or lesion Psych- euthymic mood, full affect Neuro- strength and sensation are intact  Labs:   Lab Results  Component Value Date   WBC 7.6 10/13/2021   HGB 12.4 (L) 10/13/2021   HCT 37.9 (L) 10/13/2021   MCV 92.4 10/13/2021   PLT 143 (L) 10/13/2021    Recent Labs  Lab 10/11/21 0847 10/11/21 1709 10/13/21 0454  NA 134*   < > 135  K 4.0   < > 3.7  CL 102   < > 102  CO2 25   < > 26  BUN 13   < > 16  CREATININE 1.07   < > 1.10  CALCIUM 8.3*   < > 8.2*  PROT 5.7*  --   --   BILITOT 2.3*  --   --   ALKPHOS 58  --   --   ALT 50*  --   --   AST 203*  --   --   GLUCOSE 127*   < > 158*   < > = values in this interval not displayed.      Radiology/Studies: No results found.  EKG: last night showed NSR at 74 bpm, with stable QTc ~ 440-450  (personally reviewed)  TELEMETRY: NSR with occasional ectopy (personally reviewed)  Assessment/Plan: 1.  Paroxysmal atrial fibrillation NSR this am EKGs thus far back  on Tikosyn have been stable. Keep K > 4.0 and Mg > 2.0  K and Mg supped this am.  Continue tikosyn 250 mcg BID On coumadin for CHA2DS2-VASc of at least 6. INR 1.6 this am.   Will need to be monitored for at least 3 more doses including this am. If QTc stable after am dose 10/14/2021, Will be stable to continue tikosyn without further inpatient monitoring.   For questions or updates, please contact [facility] Please consult [email or web address] for contact info under Cardiology/STEMI.  Jacalyn Lefevre, PA-C  10/13/2021 8:27 AM  EP Attending  Patient seen and examined. Agree with the findings as noted above. The patient was admitted with a mild case of rhabdo and his dofetilide was initially held. His kidney function has been stable. He has been restarted on dofetilide. His exam is notable for an elderly appearing man in NAD with clear lungs. No edema.  Labs reviewed. Tele with NSR. He will continue dofetilide and I would anticipate DC home on Tuesday if QT is ok.  Carleene Overlie Daziah Hesler,MD

## 2021-10-13 NOTE — TOC Progression Note (Addendum)
Transition of Care (TOC) - Progression Note    Patient Details  Name: Nathaniel [name]. MRN: [id number] Date of Birth: [birth year]  Transition of Care [facility]) CM/SW Contact  Nathaniel [name], [place] Phone Number: 10/13/2021, 4:25 PM  Clinical Narrative:    [facility] CSW reached out to the patient's son [name] [phone] to see if he spoke with his father regarding the discharge plan and PT's recommendations and CSW left a voicemail for him to return the call when they get a chance.  Patient's son Nathaniel [name] returned the call to discuss his dad and the discharge plan. Nathaniel [name] reported that he will be out of town until next Tuesday and won't be able to help assist until then and that rehab would be helpful for his dad to build his strength back.  [facility] CSW spoke with Nathaniel [name] at bedside about SNF rehab at time of discharge. Nathaniel [name] reported that [facility], [facility], [facility] and [facility] are all facilities he is considering. Nathaniel [name] would like the CSW to follow up tomorrow around 11:20am when his granddaughter may come by for a visit. CSW will see Nathaniel [name] tomorrow to follow up from conversation today.  CSW will continue to follow throughout discharge.   Expected Discharge Plan:  (Pt has not fully agreed to SNF but is considering it) Barriers to Discharge: Continued Medical Work up, SNF Pending bed offer  Expected Discharge Plan and Services Expected Discharge Plan:  (Pt has not fully agreed to SNF but is considering it) In-house Referral: Clinical Social Work   Post Acute Care Choice:  (TBD) Living arrangements for the past 2 months: Single Family Home                                       Social Determinants of Health (SDOH) Interventions    Readmission Risk Interventions No flowsheet data found.  Nathaniel [name], MSW, [place] Heart Failure Social Worker

## 2021-10-13 NOTE — Progress Notes (Addendum)
Advanced Heart Failure Rounding Note  PCP-Cardiologist: None   Subjective:    Admitted after fall at home. Had been down 6 hr. Now w/ rhabdomyolysis and found to have Ecoli UTI. Missed several doses of Tikosyn. Starting reload. EP following.   On Tikosyn 250 mcg bid. QT/QTc on last night's EKG  400/444 ms K 3.7 Mg 1.9   CK downtrending, 12,793 on admit>>>5,152 today  SCr ok, 1.10   Home lasix and Jardiance on hold. Getting spiro. Mild fluid overload on exam.   Feels ok today. Denies dyspnea.   NSR on tele, HR 58 bpm.   Objective:   Weight Range: 90 kg Body mass index is 28.47 kg/m.   Vital Signs:   Temp:  [98.4 F (36.9 C)-98.7 F (37.1 C)] 98.4 F (36.9 C) (10/31 0413) Pulse Rate:  [60-71] 66 (10/31 0841) Resp:  [18] 18 (10/31 0413) BP: (119-145)/(49-69) 140/63 (10/31 0841) SpO2:  [92 %-97 %] 92 % (10/31 0413) Weight:  [90 kg] 90 kg (10/31 0413) Last BM Date: 10/10/21  Weight change: Filed Weights   10/10/21 1909 10/13/21 0413  Weight: 89.4 kg 90 kg    Intake/Output:   Intake/Output Summary (Last 24 hours) at 10/13/2021 0959 Last data filed at 10/13/2021 0756 Gross per 24 hour  Intake 2448.47 ml  Output 50 ml  Net 2398.47 ml      Physical Exam    General:  Well appearing elderly WM. No resp difficulty HEENT: Normal Neck: Supple. JVD elevated . Carotids 2+ bilat; no bruits. No lymphadenopathy or thyromegaly appreciated. Cor: PMI nondisplaced. Regular rate & rhythm. No rubs, gallops or murmurs. Lungs: Clear Abdomen: Soft, nontender, nondistended. No hepatosplenomegaly. No bruits or masses. Good bowel sounds. Extremities: No cyanosis, clubbing, rash, edema Neuro: Alert & orientedx3, cranial nerves grossly intact. moves all 4 extremities w/o difficulty. Affect pleasant   Telemetry   Sinus bradycardia 58 bpm   EKG    EKG 10/30 NSR 74 bpm, incomplete LBBB QT/QTcB 400/444 ms   Labs    CBC Recent Labs    10/10/21 2128 10/11/21 0847  10/12/21 0443 10/13/21 0454  WBC 17.5* 16.0* 12.6* 7.6  NEUTROABS 15.2* 13.0*  --   --   HGB 15.2 13.5 12.4* 12.4*  HCT 45.9 39.9 38.8* 37.9*  MCV 91.8 92.1 93.7 92.4  PLT 162 147* 125* 751*   Basic Metabolic Panel Recent Labs    10/12/21 0443 10/13/21 0454  NA 134* 135  K 4.0 3.7  CL 103 102  CO2 22 26  GLUCOSE 110* 158*  BUN 16 16  CREATININE 1.11 1.10  CALCIUM 8.2* 8.2*  MG 2.1 1.9   Liver Function Tests Recent Labs    10/10/21 2128 10/11/21 0847  AST 265* 203*  ALT 56* 50*  ALKPHOS 58 58  BILITOT 2.6* 2.3*  PROT 6.9 5.7*  ALBUMIN 4.0 3.0*   No results for input(s): LIPASE, AMYLASE in the last 72 hours. Cardiac Enzymes Recent Labs    10/12/21 0443 10/12/21 1623 10/13/21 0454  CKTOTAL 8,225* 7,535* 5,152*    BNP: BNP (last 3 results) Recent Labs    01/01/21 1313 02/20/21 0949  BNP 183.8* 217.9*    ProBNP (last 3 results) No results for input(s): PROBNP in the last 8760 hours.   D-Dimer No results for input(s): DDIMER in the last 72 hours. Hemoglobin A1C No results for input(s): HGBA1C in the last 72 hours. Fasting Lipid Panel No results for input(s): CHOL, HDL, LDLCALC, TRIG, CHOLHDL, LDLDIRECT in  the last 72 hours. Thyroid Function Tests No results for input(s): TSH, T4TOTAL, T3FREE, THYROIDAB in the last 72 hours.  Invalid input(s): FREET3  Other results:   Imaging    No results found.   Medications:     Scheduled Medications:  Chlorhexidine Gluconate Cloth  6 each Topical Daily   dofetilide  250 mcg Oral BID   ezetimibe  10 mg Oral QHS   insulin aspart  0-9 Units Subcutaneous TID WC   latanoprost  1 drop Both Eyes QHS   magnesium oxide  800 mg Oral BID   metoprolol succinate  25 mg Oral BID   potassium chloride  40 mEq Oral BID   sodium chloride flush  3 mL Intravenous Q12H   spironolactone  25 mg Oral Daily   tamsulosin  0.4 mg Oral BID   timolol  1 drop Both Eyes QHS   warfarin  7 mg Oral ONCE-1600   Warfarin -  Pharmacist Dosing Inpatient   Does not apply q1600    Infusions:  sodium chloride     cefTRIAXone (ROCEPHIN)  IV 1 g (10/12/21 2051)   lactated ringers 100 mL/hr at 10/13/21 0956    PRN Medications: sodium chloride, melatonin, sodium chloride flush   Assessment/Plan   1. Rhabdomyolysis: Weakness leading to fall at home, may have been related to E coli UTI. Down x 6 hr. Initial CK 12,793. No AKI. Improving w/ IVFs. CK down to 5,152 today.  - management per IM  2. Atrial fibrillation: Possible CNS side effects with amiodarone.  Given worsening CHF with atrial fibrillation and difficulty with rate control, I think that we need to maintain NSR.  He is on Tikosyn. Several breakthrough episodes in the last 6 months.  He saw EP and was offered ablation but decided against it. He is currently in NSR/SB.  Missed several doses of Tikosyn recently given fall at home. Down x 6 hrs.  - Reloading Tikosyn, c/w 250 mgc bid. QT/QTc stable 400/444 ms. EP following  - Serial EKGs for QT monitoring  - Keep K > 4.0 and Mg > 2.0  - Continue warfarin. INR 1.3 today  - Continue Toprol XL 25 mg bid.  - Avoid OTC stimulants (had AF with use of [name]).  3. Chronic Primarily Diastolic CHF: Suspect ischemic cardiomyopathy.  EF 25-30% in 6/15 but had normalized to 55-60% by 1/16.  Down to 45-50% on 1/19 echo but was in the setting of atrial fibrillation.  TEE in 6/20 showed EF 50%. TEE in 2/22 showed EF 45-50%.  NYHA class II-III symptoms, worse in atrial fibrillation.  He tolerates AF poorly, with worsening of HF symptoms. On Tikosyn for rhythm control, in SR. Diuretics and Jardiance held on admit. Getting IVFs for Rhabdomyolysis. Now w/ mild fluid overload. SCr stable at 1.10.  - restart PO lasix today, 40 mg daily  - Stop IVFs  - continue spiro 25 mg  - continue Toprol XL 25 mg bid  - Holding Jardiance w/ UTI  4. CAD: s/p CABG and redo CABG.  Given fall in EF, I did a cardiac cath in 7/15 as documented  above. Unfortunately, despite significant coronary disease, there were no good interventional targets.  He denies recent chest pain.  - Zetia, atorvastatin on hold with elevated CK.  He is on Repatha at home.  - He is on warfarin in setting of stable CAD so does not need ASA 81.   5. Hyperlipidemia: He is taking atorvastatin + Zetia and Repatha  but has not been consistent w/ Repatha use. Recent LP 10/22 w/ elevated LDL 113 - Needs to improve compliance w/ Repatha at take every 2 wks 6. PAD: Patient denies claudication or foot ulcers. Peripheral arterial dopplers showed > 50% bilateral focal SFA stenoses in the past.  He is not a cilostazol candidate with CHF. He is followed by Dr. Trula Slade at [facility], had recent appt. No current claudication symptoms.  - Has appointment at [facility] in 12/22.  7. Carotid stenosis: S/p right CEA in 2/19.  Followed by Dr. Trula Slade at [facility]. Carotid Dopplers completed 11/21-->bilateral ICA without significant stenosis but >50% common carotid stenosis on left. - Followup at [facility] in 12/22.  8. AAA: Successful endovascular repair in 5/15.  Followed by Dr. Trula Slade at [facility].  9. Bioprosthetic AVR: Valve stable on 2/22 TEE.  10. HTN: controlled on current regimen  11. Diabetes: He is on Jardiance at home.  Held on admit due to suspected UTI. - insulin per IM  12. OSA: Daytime sleepiness and fatigue. He says that he will not use CPAP so we will defer sleep study.  13. E coli UTI: Getting IV ceftriaxone. Holding [place].   Length of Stay: 2  Lyda Jester, PA-C  10/13/2021, 9:59 AM  Advanced Heart Failure Team Pager [phone] (M-F; 7a - 5p)  Please contact [facility] for night-coverage after hours (5p -7a ) and weekends on [email or web address]  Patient seen with PA, agree with the above note.   Feels better today but still weak overall.  CK coming down, now around 5000 with stable creatinine.    UA suggestive of UTI, has E coli growing on urine culture.   General: NAD Neck: JVP  8-9 cm, no thyromegaly or thyroid nodule.  Lungs: Clear to auscultation bilaterally with normal respiratory effort. CV: Nondisplaced PMI.  Heart regular S1/S2, no S3/S4, no murmur.  Trace ankle edema.  Abdomen: Soft, nontender, no hepatosplenomegaly, no distention.  Skin: Intact without lesions or rashes.  Neurologic: Alert and oriented x 3.  Psych: Normal affect. Extremities: No clubbing or cyanosis.  HEENT: Normal.   Suspect E coli UTI developed with weakness then fall and unable to get up.  Developed traumatic rhabdomyolysis.  CK has trended down (5000 now) with stable creatinine.  On exam, he is now mildly volume overloaded with fluid resuscitation.  - Can stop IVF at this point.  - Add back home Lasix 40 mg daily and spironolactone 25 mg daily.  - Treading E coli UTI with ceftriaxone.  - With UTI, will need to stop Jardiance.  Would keep him off at this point.  - If BP remains stable, can restart home ramipril tomorrow.  - Hold atorvastatin while CK elevated.   Missed several Tikosyn doses, reloading. QTc ok and he is in NSR.   [name] 10/13/2021 10:47 AM

## 2021-10-14 DIAGNOSIS — R262 Difficulty in walking, not elsewhere classified: Secondary | ICD-10-CM | POA: Diagnosis not present

## 2021-10-14 DIAGNOSIS — I6522 Occlusion and stenosis of left carotid artery: Secondary | ICD-10-CM | POA: Diagnosis not present

## 2021-10-14 DIAGNOSIS — E1122 Type 2 diabetes mellitus with diabetic chronic kidney disease: Secondary | ICD-10-CM | POA: Diagnosis not present

## 2021-10-14 DIAGNOSIS — I4819 Other persistent atrial fibrillation: Secondary | ICD-10-CM | POA: Diagnosis not present

## 2021-10-14 DIAGNOSIS — B9620 Unspecified Escherichia coli [E. coli] as the cause of diseases classified elsewhere: Secondary | ICD-10-CM | POA: Diagnosis not present

## 2021-10-14 DIAGNOSIS — I480 Paroxysmal atrial fibrillation: Secondary | ICD-10-CM | POA: Diagnosis not present

## 2021-10-14 DIAGNOSIS — M6258 Muscle wasting and atrophy, not elsewhere classified, other site: Secondary | ICD-10-CM | POA: Diagnosis not present

## 2021-10-14 DIAGNOSIS — N400 Enlarged prostate without lower urinary tract symptoms: Secondary | ICD-10-CM | POA: Diagnosis not present

## 2021-10-14 DIAGNOSIS — I5042 Chronic combined systolic (congestive) and diastolic (congestive) heart failure: Secondary | ICD-10-CM | POA: Diagnosis not present

## 2021-10-14 DIAGNOSIS — T796XXA Traumatic ischemia of muscle, initial encounter: Secondary | ICD-10-CM | POA: Diagnosis not present

## 2021-10-14 DIAGNOSIS — I959 Hypotension, unspecified: Secondary | ICD-10-CM | POA: Diagnosis not present

## 2021-10-14 DIAGNOSIS — R338 Other retention of urine: Secondary | ICD-10-CM | POA: Diagnosis not present

## 2021-10-14 DIAGNOSIS — N182 Chronic kidney disease, stage 2 (mild): Secondary | ICD-10-CM | POA: Diagnosis not present

## 2021-10-14 DIAGNOSIS — L89891 Pressure ulcer of other site, stage 1: Secondary | ICD-10-CM | POA: Diagnosis not present

## 2021-10-14 DIAGNOSIS — I509 Heart failure, unspecified: Secondary | ICD-10-CM | POA: Diagnosis not present

## 2021-10-14 DIAGNOSIS — N390 Urinary tract infection, site not specified: Secondary | ICD-10-CM | POA: Diagnosis not present

## 2021-10-14 DIAGNOSIS — I471 Supraventricular tachycardia: Secondary | ICD-10-CM | POA: Diagnosis not present

## 2021-10-14 DIAGNOSIS — I152 Hypertension secondary to endocrine disorders: Secondary | ICD-10-CM | POA: Diagnosis not present

## 2021-10-14 DIAGNOSIS — R2689 Other abnormalities of gait and mobility: Secondary | ICD-10-CM | POA: Diagnosis not present

## 2021-10-14 DIAGNOSIS — N3000 Acute cystitis without hematuria: Secondary | ICD-10-CM | POA: Diagnosis not present

## 2021-10-14 DIAGNOSIS — I130 Hypertensive heart and chronic kidney disease with heart failure and stage 1 through stage 4 chronic kidney disease, or unspecified chronic kidney disease: Secondary | ICD-10-CM | POA: Diagnosis not present

## 2021-10-14 DIAGNOSIS — Z743 Need for continuous supervision: Secondary | ICD-10-CM | POA: Diagnosis not present

## 2021-10-14 DIAGNOSIS — E1159 Type 2 diabetes mellitus with other circulatory complications: Secondary | ICD-10-CM | POA: Diagnosis not present

## 2021-10-14 DIAGNOSIS — R5381 Other malaise: Secondary | ICD-10-CM | POA: Diagnosis not present

## 2021-10-14 DIAGNOSIS — M6281 Muscle weakness (generalized): Secondary | ICD-10-CM | POA: Diagnosis not present

## 2021-10-14 DIAGNOSIS — I5032 Chronic diastolic (congestive) heart failure: Secondary | ICD-10-CM | POA: Diagnosis not present

## 2021-10-14 DIAGNOSIS — Z20822 Contact with and (suspected) exposure to covid-19: Secondary | ICD-10-CM | POA: Diagnosis not present

## 2021-10-14 DIAGNOSIS — E1151 Type 2 diabetes mellitus with diabetic peripheral angiopathy without gangrene: Secondary | ICD-10-CM | POA: Diagnosis not present

## 2021-10-14 DIAGNOSIS — M6282 Rhabdomyolysis: Secondary | ICD-10-CM | POA: Diagnosis not present

## 2021-10-14 LAB — MAGNESIUM: Magnesium: 1.9 mg/dL (ref 1.7–2.4)

## 2021-10-14 LAB — RESP PANEL BY RT-PCR (FLU A&B, COVID) ARPGX2
Influenza A by PCR: NEGATIVE
Influenza B by PCR: NEGATIVE
SARS Coronavirus 2 by RT PCR: NEGATIVE

## 2021-10-14 LAB — CK
Total CK: 2938 U/L — ABNORMAL HIGH (ref 49–397)
Total CK: 2251 U/L — ABNORMAL HIGH (ref 49–397)

## 2021-10-14 LAB — PROTIME-INR
INR: 2.1 — ABNORMAL HIGH (ref 0.8–1.2)
Prothrombin Time: 23.9 seconds — ABNORMAL HIGH (ref 11.4–15.2)

## 2021-10-14 LAB — CBC
HCT: 38.4 % — ABNORMAL LOW (ref 39.0–52.0)
Hemoglobin: 12.8 g/dL — ABNORMAL LOW (ref 13.0–17.0)
MCH: 30.7 pg (ref 26.0–34.0)
MCHC: 33.3 g/dL (ref 30.0–36.0)
MCV: 92.1 fL (ref 80.0–100.0)
Platelets: 162 10*3/uL (ref 150–400)
RBC: 4.17 MIL/uL — ABNORMAL LOW (ref 4.22–5.81)
RDW: 12.6 % (ref 11.5–15.5)
WBC: 7.2 10*3/uL (ref 4.0–10.5)
nRBC: 0 % (ref 0.0–0.2)

## 2021-10-14 LAB — BASIC METABOLIC PANEL
Anion gap: 7 (ref 5–15)
BUN: 15 mg/dL (ref 8–23)
CO2: 28 mmol/L (ref 22–32)
Calcium: 8.5 mg/dL — ABNORMAL LOW (ref 8.9–10.3)
Chloride: 101 mmol/L (ref 98–111)
Creatinine, Ser: 1.05 mg/dL (ref 0.61–1.24)
GFR, Estimated: >60 mL/min (ref >60)
Glucose, Bld: 122 mg/dL — ABNORMAL HIGH (ref 70–99)
Potassium: 4.3 mmol/L (ref 3.5–5.1)
Sodium: 136 mmol/L (ref 135–145)

## 2021-10-14 LAB — GLUCOSE, CAPILLARY
Glucose-Capillary: 131 mg/dL — ABNORMAL HIGH (ref 70–99)
Glucose-Capillary: 155 mg/dL — ABNORMAL HIGH (ref 70–99)
Glucose-Capillary: 211 mg/dL — ABNORMAL HIGH (ref 70–99)

## 2021-10-14 MED ORDER — MAGNESIUM SULFATE 2 GM/50ML IV SOLN
2.0000 g | Freq: Once | INTRAVENOUS | Status: AC
  Administered 2021-10-14: 2 g via INTRAVENOUS
  Filled 2021-10-14: qty 50

## 2021-10-14 MED ORDER — RAMIPRIL 2.5 MG PO CAPS
2.5000 mg | ORAL_CAPSULE | Freq: Two times a day (BID) | ORAL | Status: DC
  Administered 2021-10-14: 2.5 mg via ORAL
  Filled 2021-10-14 (×2): qty 1

## 2021-10-14 MED ORDER — WARFARIN SODIUM 5 MG PO TABS
6.0000 mg | ORAL_TABLET | Freq: Once | ORAL | Status: AC
  Administered 2021-10-14: 6 mg via ORAL
  Filled 2021-10-14: qty 1

## 2021-10-14 MED ORDER — FUROSEMIDE 40 MG PO TABS: 40.0000 mg | ORAL_TABLET | Freq: Every day | ORAL | Status: DC

## 2021-10-14 MED ORDER — CEPHALEXIN 500 MG PO CAPS: 500.0000 mg | ORAL_CAPSULE | Freq: Two times a day (BID) | ORAL | 0 refills | Status: AC

## 2021-10-14 MED ORDER — METOPROLOL SUCCINATE ER 25 MG PO TB24
25.0000 mg | ORAL_TABLET | Freq: Two times a day (BID) | ORAL | Status: DC
  Filled 2021-10-14: qty 1

## 2021-10-14 NOTE — Progress Notes (Signed)
Morning EKG reviewed   Shows remains in NSR at with stable QTc in 440-460 ms range  Continue Tikosyn 250 mcg BID.   He has finished his reload and does not require further timed EKGs unless his QT appears to prolong by Telemetry.   EP will follow at a distance if remains inpatient. He may continue his PTA Tikosyn dose at discharge.   Shirley Friar, PA-C  Pager: [phone]  10/14/2021 11:01 AM

## 2021-10-14 NOTE — TOC Progression Note (Signed)
Transition of Care (TOC) - Progression Note    Patient Details  Name: Nathaniel [name]. MRN: [id number] Date of Birth: Apr 28, 1932  Transition of Care [facility]) CM/SW Contact  Everleigh Colclasure, [place] Phone Number: 10/14/2021, 11:47 AM  Clinical Narrative:    HF CSW spoke with [name] and his granddaughter, [name] at bedside today to follow up on the discussion of rehab and the discharge plan. [name] is medically ready for discharge via the treatment team and attending MD. After further conversation [name] is agreeable to go to [facility]. CSW reached out to Star at [facility] and she has a semi-private room available today and when a private room becomes available he may get a private room. CSW spoke with Gerald Stabs the Retail buyer who put in a rapid COVID test. CSW will work on [place] today to [place].  CSW will continue to follow throughout discharge.   Expected Discharge Plan:  (Pt has not fully agreed to SNF but is considering it) Barriers to Discharge: Continued Medical Work up, SNF Pending bed offer  Expected Discharge Plan and Services Expected Discharge Plan:  (Pt has not fully agreed to SNF but is considering it) In-house Referral: Clinical Social Work   Post Acute Care Choice:  (TBD) Living arrangements for the past 2 months: Single Family Home                                       Social Determinants of Health (SDOH) Interventions Food Insecurity Interventions: Intervention Not Indicated Financial Strain Interventions: Intervention Not Indicated Housing Interventions: Intervention Not Indicated Transportation Interventions: Intervention Not Indicated  Readmission Risk Interventions No flowsheet data found.  Jupiter Boys, MSW, [place] Heart Failure Social Worker

## 2021-10-14 NOTE — Discharge Summary (Signed)
Physician Discharge Summary  Nathaniel [name]. KKX:[id number] DOB: 06-17-1932 DOA: 10/10/2021  PCP: Lorne Skeens, MD  Admit date: 10/10/2021 Discharge date: 10/14/2021  Admitted From: Home Disposition: Skilled nursing rehab  Recommendations for Outpatient Follow-up:  Follow up with PCP in 1-2 weeks Please obtain BMP/CBC/magnesium/phosphorus in one week Continue to monitor for any urinary retention.  He will schedule follow-up with urology.  If he returned significantly, may even need a Foley catheter.  Home Health: N/A Equipment/Devices: N/A  Discharge Condition: Stable CODE STATUS: Full code Diet recommendation: Low-salt, low-carb diet  Discharge summary: 87 year old gentleman with known history of coronary artery disease status post CABG, bioprosthetic aortic valve, paroxysmal atrial fibrillation on Tikosyn status post cardioversion, type 2 diabetes , peripheral vascular disease and BPH presented with fall at home, 6 hours on the floor.  He was found to have rhabdomyolysis with CK level of more than 12,000, WBC count 17.5.  Missed dose of Coumadin with INR 1.6 and missed doses of Tikosyn. Patient admitted to the hospital with rhabdomyolysis, missed Tikosyn doses and acute urinary retention.  Patient was treated for different conditions as below.     Assessment & plan of care:  Acute traumatic rhabdomyolysis: Clinically improving.  Urine output is adequate.  Renal functions are adequate.  Treated with IV fluids and is stopped due to fluid overload.  CK is appropriately trending down.  With his clinical improvement, expect to normalize.  LFTs are normal.  Rhabdomyolysis not due to statins.  He will resume a statin.   Paroxysmal atrial fibrillation: Treated with cardioversion and subsequently on Tikosyn.  Patient on Coumadin.  Missed 3-4 doses of Tikosyn. Patient was followed by EP cardiology and restarted on Tikosyn with protocol.   Lites are adequate. Currently in sinus  rhythm. On Coumadin. Continue Tikosyn 250 mg twice a day without need for further inpatient monitoring.  Please check electrolytes including magnesium and phosphorus in 1 week.  Acute urinary retention with history of BPH: Possibly aggravated by UTI. Foley catheter placed while in the ER.  He does have history of prostatism and urinary retention. Patient insisted on voiding trial, Foley catheter removed 10/30.  He does have intermittent urinary retention, he voids multiple times and able to maintain without catheter. Patient declined to have Foley catheter.  When he is able to go to bathroom or stand up to urinate he is able to urinate with minimal retention. Continue Flomax 4 mg twice a day.  He will schedule follow-up with his urologist. If significant retention, he may need a Foley catheter and sooner follow-up.   Chronic diastolic heart failure: Euvolemic. Cardiology recommended to go back on Lasix 40 mg daily, spironolactone 25 mg daily, metoprolol 25 mg twice daily, ramipril 2.5 mg twice daily.  Compensated now.   Essential hypertension: Blood pressures fairly stable at this time.   Physical deconditioning and frailty: Work with PT OT.  Refer to skilled nursing rehab.   Acute UTI present on admission: Urine culture growing E. coli.  Sensitive to Rocephin.  Received Rocephin for 3 days.  Will treat as complicated UTI, Keflex for 7 more days to complete 10 days of therapy.    Type 2 diabetes, well controlled on Jardiance: Known A1c of 7.  Jardiance was discontinued due to acute UTI.  Patient does not agree to go on sliding scale insulin.  We will put on carb controlled diet.  Patient is medically stabilized and cleared by cardiology.  He is able to go to skilled rehab  today.   Discharge Diagnoses:  Principal Problem:   Rhabdomyolysis Active Problems:   Essential hypertension, malignant   Chronic diastolic CHF (congestive heart failure) (HCC)   Pressure injury of skin   Urinary  retention   Acute cystitis without hematuria    Discharge Instructions  Discharge Instructions     Ambulatory referral to Urology   Complete by: As directed    Call MD for:  difficulty breathing, headache or visual disturbances   Complete by: As directed    Diet - low sodium heart healthy   Complete by: As directed    Diet Carb Modified   Complete by: As directed    Increase activity slowly   Complete by: As directed    No wound care   Complete by: As directed       Allergies as of 10/14/2021       Reactions   Grass Extracts [gramineae Pollens] Other (See Comments)   Sneezing and runny nose   Milk-related Compounds Other (See Comments)   Just Yogurt.  Makes him very lethargic.   Amiodarone Anxiety   depression        Medication List     STOP taking these medications    empagliflozin 10 MG Tabs tablet Commonly known as: JARDIANCE       TAKE these medications    allopurinol 300 MG tablet Commonly known as: ZYLOPRIM Take 300 mg by mouth daily.   atorvastatin 20 MG tablet Commonly known as: LIPITOR Take 20 mg by mouth every other day. Along with 10 mg to total 30 mg on alternate days   atorvastatin 20 MG tablet Commonly known as: LIPITOR Take 20 mg by mouth daily.   atorvastatin 10 MG tablet Commonly known as: LIPITOR TAKE ONE TABLET DAILY   cephALEXin 500 MG capsule Commonly known as: KEFLEX Take 1 capsule (500 mg total) by mouth 2 (two) times daily for 7 days.   Co Q10 200 MG Caps Take 200 mg by mouth daily.   dofetilide 250 MCG capsule Commonly known as: TIKOSYN TAKE ONE CAPSULE TWICE A DAY   ezetimibe 10 MG tablet Commonly known as: ZETIA Take 1 tablet (10 mg total) by mouth at bedtime.   furosemide 40 MG tablet Commonly known as: LASIX Take 1 tablet (40 mg total) by mouth daily. Start taking on: October 15, 2021 What changed:  medication strength how much to take when to take this additional instructions   Glucosamine  Chondroitin Triple Tabs Take 1 tablet by mouth daily.   latanoprost 0.005 % ophthalmic solution Commonly known as: XALATAN Place 1 drop into both eyes daily.   Metoprolol Succinate 25 MG Cs24 Take 25 mg by mouth 2 (two) times daily.   ramipril 2.5 MG capsule Commonly known as: ALTACE Take 1 capsule (2.5 mg total) by mouth 2 (two) times daily. What changed:  how much to take when to take this   Repatha SureClick 979 MG/ML Soaj Generic drug: Evolocumab Inject 1 pen into the skin every 14 (fourteen) days. What changed: how much to take   spironolactone 25 MG tablet Commonly known as: ALDACTONE TAKE ONE TABLET DAILY   tamsulosin 0.4 MG Caps capsule Commonly known as: FLOMAX Take 0.4 mg by mouth 2 (two) times daily.   timolol 0.5 % ophthalmic solution Commonly known as: TIMOPTIC Place 1 drop into both eyes at bedtime.   warfarin 3 MG tablet Commonly known as: COUMADIN Take as directed. If you are unsure how to take this medication,  talk to your nurse or doctor. Original instructions: TAKE 3 TABLETS DAILY EXCEPT 2 TABLETS ONMONDAY AND FRIDAY OR AS DIRECTED BY CLINIC What changed:  how much to take how to take this when to take this additional instructions        Follow-up Information     [place], [name], MD Follow up in 2 week(s).   Specialty: Endocrinology Contact information: [address] [place] [address] [phone]         Constance Haw, MD .   Specialty: Cardiology Contact information: [address] [place] [address] [place] [address] [phone]                Allergies  Allergen Reactions   Grass Extracts [Gramineae Pollens] Other (See Comments)    Sneezing and runny nose   Milk-Related Compounds Other (See Comments)    Just Yogurt.  Makes him very lethargic.   Amiodarone Anxiety    depression    Consultations: Cardiology   Procedures/Studies: No results found. (Echo, Carotid, EGD, Colonoscopy,  ERCP)    Subjective: Patient seen and examined.  Himself denies any complaints.  He wanted to walk and go to the sink by himself but he could not do it.  He knows his limitations. When he stood up, he was able to urinate without problem.  I discussed with him about difficulties with urination and whether he should be discharged with Foley catheter.  He believes with improvement of UTI he will not need Foley catheter.  He does not agree with catheterization, he tells me that he will urinate more frequently and avoid catheter.  Currently without retention.   Discharge Exam: Vitals:   10/14/21 1036 10/14/21 1039  BP: (!) 147/68 (!) 147/68  Pulse:  60  Resp:  16  Temp:  (!) 97.1 F (36.2 C)  SpO2:  92%   Vitals:   10/14/21 0731 10/14/21 0827 10/14/21 1036 10/14/21 1039  BP: (!) 164/75 (!) 164/75 (!) 147/68 (!) 147/68  Pulse: 75 69  60  Resp: 19   16  Temp: 97.6 F (36.4 C)   (!) 97.1 F (36.2 C)  TempSrc: Oral   Oral  SpO2: 91%   92%  Weight:      Height:        General: Pt is alert, awake, not in acute distress On room air. Cardiovascular: RRR, S1/S2 +, no rubs, no gallops Respiratory: CTA bilaterally, no wheezing, no rhonchi Abdominal: Soft, NT, ND, bowel sounds + Extremities: no edema, no cyanosis    The results of significant diagnostics from this hospitalization (including imaging, microbiology, ancillary and laboratory) are listed below for reference.     Microbiology: Recent Results (from the past 240 hour(s))  Urine Culture     Status: Abnormal   Collection Time: 10/10/21  9:16 PM   Specimen: Urine, Catheterized  Result Value Ref Range Status   Specimen Description   Final    URINE, CATHETERIZED Performed at [facility], [address] [place], [place] [address]    Special Requests   Final    NONE Performed at [facility], [address] [place], [place] [address]    Culture >=100,000 COLONIES/mL ESCHERICHIA COLI  (A)  Final   Report Status 10/13/2021 FINAL  Final   Organism ID, Bacteria ESCHERICHIA COLI (A)  Final      Susceptibility   Escherichia coli - MIC*    AMPICILLIN >=32 RESISTANT Resistant     CEFAZOLIN >=64  RESISTANT Resistant     CEFEPIME <=0.12 SENSITIVE Sensitive     CEFTRIAXONE <=0.25 SENSITIVE Sensitive     CIPROFLOXACIN <=0.25 SENSITIVE Sensitive     GENTAMICIN <=1 SENSITIVE Sensitive     IMIPENEM <=0.25 SENSITIVE Sensitive     NITROFURANTOIN <=16 SENSITIVE Sensitive     TRIMETH/SULFA <=20 SENSITIVE Sensitive     AMPICILLIN/SULBACTAM >=32 RESISTANT Resistant     PIP/TAZO <=4 SENSITIVE Sensitive     * >=100,000 COLONIES/mL ESCHERICHIA COLI  Resp Panel by RT-PCR (Flu A&B, Covid) Urine, Clean Catch     Status: None   Collection Time: 10/11/21 12:44 AM   Specimen: Urine, Clean Catch; Nasopharyngeal(NP) swabs in vial transport medium  Result Value Ref Range Status   SARS Coronavirus 2 by RT PCR NEGATIVE NEGATIVE Final    Comment: (NOTE) SARS-CoV-2 target nucleic acids are NOT DETECTED.  The SARS-CoV-2 RNA is generally detectable in upper respiratory specimens during the acute phase of infection. The lowest concentration of SARS-CoV-2 viral copies this assay can detect is 138 copies/mL. A negative result does not preclude SARS-Cov-2 infection and should not be used as the sole basis for treatment or other patient management decisions. A negative result may occur with  improper specimen collection/handling, submission of specimen other than nasopharyngeal swab, presence of viral mutation(s) within the areas targeted by this assay, and inadequate number of viral copies(<138 copies/mL). A negative result must be combined with clinical observations, patient history, and epidemiological information. The expected result is Negative.  Fact Sheet for Patients:  EntrepreneurPulse.com.au  Fact Sheet for Healthcare Providers:   IncredibleEmployment.be  This test is no t yet approved or cleared by the [place] FDA and  has been authorized for detection and/or diagnosis of SARS-CoV-2 by FDA under an Emergency Use Authorization (EUA). This EUA will remain  in effect (meaning this test can be used) for the duration of the COVID-19 declaration under Section 564(b)(1) of the Act, 21 U.S.C.section 360bbb-3(b)(1), unless the authorization is terminated  or revoked sooner.       Influenza A by PCR NEGATIVE NEGATIVE Final   Influenza B by PCR NEGATIVE NEGATIVE Final    Comment: (NOTE) The Xpert Xpress SARS-CoV-2/FLU/RSV plus assay is intended as an aid in the diagnosis of influenza from Nasopharyngeal swab specimens and should not be used as a sole basis for treatment. Nasal washings and aspirates are unacceptable for Xpert Xpress SARS-CoV-2/FLU/RSV testing.  Fact Sheet for Patients: EntrepreneurPulse.com.au  Fact Sheet for Healthcare Providers: IncredibleEmployment.be  This test is not yet approved or cleared by the [place] FDA and has been authorized for detection and/or diagnosis of SARS-CoV-2 by FDA under an Emergency Use Authorization (EUA). This EUA will remain in effect (meaning this test can be used) for the duration of the COVID-19 declaration under Section 564(b)(1) of the Act, 21 U.S.C. section 360bbb-3(b)(1), unless the authorization is terminated or revoked.  Performed at [facility], [address] [place], [place] [address]      Labs: BNP (last 3 results) Recent Labs    01/01/21 1313 02/20/21 0949  BNP 183.8* 191.4*   Basic Metabolic Panel: Recent Labs  Lab 10/11/21 0629 10/11/21 0847 10/11/21 1709 10/12/21 0443 10/13/21 0454 10/14/21 0448  NA  --  134* 134* 134* 135 136  K  --  4.0 4.0 4.0 3.7 4.3  CL  --  102 101 103 102 101  CO2  --  25 27 22 26 28   GLUCOSE  --  127* 146* 110*  158*  122*  BUN  --  13 13 16 16 15   CREATININE  --  1.07 1.24 1.11 1.10 1.05  CALCIUM  --  8.3* 8.2* 8.2* 8.2* 8.5*  MG 1.8  --  2.4 2.1 1.9 1.9   Liver Function Tests: Recent Labs  Lab 10/10/21 2128 10/11/21 0847  AST 265* 203*  ALT 56* 50*  ALKPHOS 58 58  BILITOT 2.6* 2.3*  PROT 6.9 5.7*  ALBUMIN 4.0 3.0*   No results for input(s): LIPASE, AMYLASE in the last 168 hours. No results for input(s): AMMONIA in the last 168 hours. CBC: Recent Labs  Lab 10/10/21 2128 10/11/21 0847 10/12/21 0443 10/13/21 0454 10/14/21 0448  WBC 17.5* 16.0* 12.6* 7.6 7.2  NEUTROABS 15.2* 13.0*  --   --   --   HGB 15.2 13.5 12.4* 12.4* 12.8*  HCT 45.9 39.9 38.8* 37.9* 38.4*  MCV 91.8 92.1 93.7 92.4 92.1  PLT 162 147* 125* 143* 162   Cardiac Enzymes: Recent Labs  Lab 10/12/21 0443 10/12/21 1623 10/13/21 0454 10/13/21 1645 10/14/21 0448  CKTOTAL 8,225* 7,535* 5,152* 4,524* 2,938*   BNP: Invalid input(s): POCBNP CBG: Recent Labs  Lab 10/13/21 1612 10/13/21 1728 10/13/21 2128 10/14/21 0759 10/14/21 1131  GLUCAP 194* 170* 187* 131* 155*   D-Dimer No results for input(s): DDIMER in the last 72 hours. Hgb A1c No results for input(s): HGBA1C in the last 72 hours. Lipid Profile No results for input(s): CHOL, HDL, LDLCALC, TRIG, CHOLHDL, LDLDIRECT in the last 72 hours. Thyroid function studies No results for input(s): TSH, T4TOTAL, T3FREE, THYROIDAB in the last 72 hours.  Invalid input(s): FREET3 Anemia work up No results for input(s): VITAMINB12, FOLATE, FERRITIN, TIBC, IRON, RETICCTPCT in the last 72 hours. Urinalysis    Component Value Date/Time   COLORURINE YELLOW 10/10/2021 2116   APPEARANCEUR HAZY (A) 10/10/2021 2116   LABSPEC 1.012 10/10/2021 2116   PHURINE 5.5 10/10/2021 2116   GLUCOSEU >1,000 (A) 10/10/2021 2116   HGBUR LARGE (A) 10/10/2021 2116   BILIRUBINUR NEGATIVE 10/10/2021 2116   [place] NEGATIVE 10/10/2021 2116   PROTEINUR 30 (A) 10/10/2021 2116    UROBILINOGEN 0.2 04/12/2014 1244   NITRITE NEGATIVE 10/10/2021 2116   LEUKOCYTESUR MODERATE (A) 10/10/2021 2116   Sepsis Labs Invalid input(s): PROCALCITONIN,  WBC,  LACTICIDVEN Microbiology Recent Results (from the past 240 hour(s))  Urine Culture     Status: Abnormal   Collection Time: 10/10/21  9:16 PM   Specimen: Urine, Catheterized  Result Value Ref Range Status   Specimen Description   Final    URINE, CATHETERIZED Performed at [facility], [address] [place], [place] [address]    Special Requests   Final    NONE Performed at [facility], [address], [place], [place] [address]    Culture >=100,000 COLONIES/mL ESCHERICHIA COLI (A)  Final   Report Status 10/13/2021 FINAL  Final   Organism ID, Bacteria ESCHERICHIA COLI (A)  Final      Susceptibility   Escherichia coli - MIC*    AMPICILLIN >=32 RESISTANT Resistant     CEFAZOLIN >=64 RESISTANT Resistant     CEFEPIME <=0.12 SENSITIVE Sensitive     CEFTRIAXONE <=0.25 SENSITIVE Sensitive     CIPROFLOXACIN <=0.25 SENSITIVE Sensitive     GENTAMICIN <=1 SENSITIVE Sensitive     IMIPENEM <=0.25 SENSITIVE Sensitive     NITROFURANTOIN <=16 SENSITIVE Sensitive     TRIMETH/SULFA <=20 SENSITIVE Sensitive     AMPICILLIN/SULBACTAM >=32 RESISTANT Resistant  PIP/TAZO <=4 SENSITIVE Sensitive     * >=100,000 COLONIES/mL ESCHERICHIA COLI  Resp Panel by RT-PCR (Flu A&B, Covid) Urine, Clean Catch     Status: None   Collection Time: 10/11/21 12:44 AM   Specimen: Urine, Clean Catch; Nasopharyngeal(NP) swabs in vial transport medium  Result Value Ref Range Status   SARS Coronavirus 2 by RT PCR NEGATIVE NEGATIVE Final    Comment: (NOTE) SARS-CoV-2 target nucleic acids are NOT DETECTED.  The SARS-CoV-2 RNA is generally detectable in upper respiratory specimens during the acute phase of infection. The lowest concentration of SARS-CoV-2 viral copies this assay can detect is 138 copies/mL. A  negative result does not preclude SARS-Cov-2 infection and should not be used as the sole basis for treatment or other patient management decisions. A negative result may occur with  improper specimen collection/handling, submission of specimen other than nasopharyngeal swab, presence of viral mutation(s) within the areas targeted by this assay, and inadequate number of viral copies(<138 copies/mL). A negative result must be combined with clinical observations, patient history, and epidemiological information. The expected result is Negative.  Fact Sheet for Patients:  EntrepreneurPulse.com.au  Fact Sheet for Healthcare Providers:  IncredibleEmployment.be  This test is no t yet approved or cleared by the [place] FDA and  has been authorized for detection and/or diagnosis of SARS-CoV-2 by FDA under an Emergency Use Authorization (EUA). This EUA will remain  in effect (meaning this test can be used) for the duration of the COVID-19 declaration under Section 564(b)(1) of the Act, 21 U.S.C.section 360bbb-3(b)(1), unless the authorization is terminated  or revoked sooner.       Influenza A by PCR NEGATIVE NEGATIVE Final   Influenza B by PCR NEGATIVE NEGATIVE Final    Comment: (NOTE) The Xpert Xpress SARS-CoV-2/FLU/RSV plus assay is intended as an aid in the diagnosis of influenza from Nasopharyngeal swab specimens and should not be used as a sole basis for treatment. Nasal washings and aspirates are unacceptable for Xpert Xpress SARS-CoV-2/FLU/RSV testing.  Fact Sheet for Patients: EntrepreneurPulse.com.au  Fact Sheet for Healthcare Providers: IncredibleEmployment.be  This test is not yet approved or cleared by the [place] FDA and has been authorized for detection and/or diagnosis of SARS-CoV-2 by FDA under an Emergency Use Authorization (EUA). This EUA will remain in effect (meaning this test can  be used) for the duration of the COVID-19 declaration under Section 564(b)(1) of the Act, 21 U.S.C. section 360bbb-3(b)(1), unless the authorization is terminated or revoked.  Performed at [facility], [address], [place], [place] [address]      Time coordinating discharge: 45 minutes  SIGNED:   Barb Merino, MD  [facility] 10/14/2021, 12:13 PM

## 2021-10-14 NOTE — Progress Notes (Signed)
Called report to Diane at [facility], [facility]. Patient scheduled to be picked up by [facility] at 7:30.

## 2021-10-14 NOTE — TOC Transition Note (Addendum)
Transition of Care [facility]) - CM/SW Discharge Note   Patient Details  Name: Nathaniel [name]. MRN: [id number] Date of Birth: 10-29-1932  [facility]) CM/SW Contact:  [place], [place] Phone Number: 10/14/2021, 12:31 PM   Clinical Narrative:    Patient will DC to: [facility], SNF Anticipated DC date: 10/14/21 Family notified: Granddaughter, Publishing copy by: Corey Harold   Per MD patient ready for DC to . RN to call report prior to discharge [phone] room# [id number]). RN, patient, patient's family, and facility notified of DC. Discharge Summary and FL2 sent to facility. COVID test results are negative. DC packet on chart. Ambulance transport requested for patient eta roughly 7:30pm.   CSW will sign off for now as social work intervention is no longer needed. Please consult [place] again if new needs arise.     Final next level of care: Skilled Nursing Facility Barriers to Discharge: No Barriers Identified   Patient Goals and CMS Choice Patient states their goals for this hospitalization and ongoing recovery are:: be able to continue his financial investing every day CMS Medicare.gov Compare Post Acute Care list provided to:: Patient Choice offered to / list presented to : Patient  Discharge Placement PASRR number recieved: 10/14/21            Patient chooses bed at: [facility] Patient to be transferred to facility by: [facility] Name of family member notified: [name], granddaughter Patient and family notified of of transfer: 10/14/21  Discharge Plan and Services In-house Referral: Clinical Social Work   Post Acute Care Choice:  (TBD)                               Social Determinants of Health (SDOH) Interventions Food Insecurity Interventions: Intervention Not Indicated Financial Strain Interventions: Intervention Not Indicated Housing Interventions: Intervention Not Indicated Transportation Interventions: Intervention Not Indicated   Readmission Risk  Interventions No flowsheet data found.  Alaiyah Bollman, MSW, [place] Heart Failure Social Worker

## 2021-10-14 NOTE — Progress Notes (Signed)
Physical Therapy Treatment Patient Details Name: Nathaniel [name]. MRN: [id number] DOB: 1932/04/18 Today's Date: 10/14/2021   History of Present Illness 87 y/o male presented to ED on 10/28 for urinary retention and fall and was on ground for at least 6 hours. Found to have UTI and rhabdomyolysis. PMH: Afib, AAA, CAD, CHF, CKD, DM type 2, GERD, HLD, HTN, PVD    PT Comments    Pt was seen for mobility on side of bed to stand with SPC and maneuver in the room.  Took two 30' walks, with dense cues needed for safety and navigation of room, requiring HHA as well or would hold the furniture.  Pt finally was given a RW for a short walk with much better control of path and safety to move in a confined space.  He is motivated to recover, looking forward to going to SNF for completion of strengthening.   Follow him for goals of PT in plan of care, to focus on standing balance, safety with quick directional changes and continue to work on the AD needed for safest home use.  Family is in to assist him, and will be available when rehab is completed.  Pt is distracted by his frequent urges to urinate and this will be a need to manage his safety with sudden urges to rush to bathroom.  Using a urinal for now, recommend to continue this.    Recommendations for follow up therapy are one component of a multi-disciplinary discharge planning process, led by the attending physician.  Recommendations may be updated based on patient status, additional functional criteria and insurance authorization.  Follow Up Recommendations  Skilled nursing-short term rehab (<3 hours/day)     Assistance Recommended at Discharge Frequent or constant Supervision/Assistance  Equipment Recommendations  Rolling walker (2 wheels)    Recommendations for Other Services       Precautions / Restrictions Precautions Precautions: Fall Precaution Comments: using urinal frequently for urgency issues Restrictions Weight Bearing  Restrictions: No     Mobility  Bed Mobility Overal bed mobility: Needs Assistance Bed Mobility: Supine to Sit;Sit to Supine     Supine to sit: Min assist Sit to supine: Min assist   General bed mobility comments: min to support trunk up and then cues to finish scooting out, loses focus on task without prompting    Transfers Overall transfer level: Needs assistance Equipment used: Straight cane;1 person hand held assist Transfers: Sit to/from Stand Sit to Stand: Min assist           General transfer comment: min assist with cues for hand placement every trial and PT helping to power up    Ambulation/Gait Ambulation/Gait assistance: Min guard Gait Distance (Feet): 80 Feet (40 x 2) Assistive device: 1 person hand held assist;Straight cane Gait Pattern/deviations: Step-through pattern;Decreased stride length;Wide base of support Gait velocity: decreased Gait velocity interpretation: <1.31 ft/sec, indicative of household ambulator General Gait Details: steps with HHA are more controlled and will reach for furniture if not using HHA.  Pt tends to run near obstacles and is more controlled when PT finally gave him a RW for stability, straight path observed   Stairs             Wheelchair Mobility    Modified Rankin (Stroke Patients Only)       Balance Overall balance assessment: Needs assistance;History of Falls Sitting-balance support: Feet supported Sitting balance-Leahy Scale: Fair     Standing balance support: Bilateral upper extremity supported;During functional activity  Standing balance-Leahy Scale: Poor                              Cognition Arousal/Alertness: Awake/alert Behavior During Therapy: WFL for tasks assessed/performed Overall Cognitive Status: Within Functional Limits for tasks assessed Area of Impairment: Safety/judgement;Problem solving;Attention;Orientation                 Orientation Level: Time Current Attention  Level: Selective     Safety/Judgement: Decreased awareness of safety;Decreased awareness of deficits   Problem Solving: Slow processing;Requires verbal cues;Requires tactile cues General Comments: loses track of time and has to be redirected for tasks        Exercises      General Comments General comments (skin integrity, edema, etc.): pt is up to walk with help, unable to safely navigate alone with SPC or with RW.  Less assistance and more control of gait on RW      Pertinent Vitals/Pain Pain Assessment: No/denies pain    Home Living                          Prior Function            PT Goals (current goals can now be found in the care plan section) Acute Rehab PT Goals Patient Stated Goal: to go home Progress towards PT goals: Progressing toward goals    Frequency    Min 3X/week      PT Plan Current plan remains appropriate    Co-evaluation              AM-PAC PT "6 Clicks" Mobility   Outcome Measure  Help needed turning from your back to your side while in a flat bed without using bedrails?: None Help needed moving from lying on your back to sitting on the side of a flat bed without using bedrails?: A Little Help needed moving to and from a bed to a chair (including a wheelchair)?: A Little Help needed standing up from a chair using your arms (e.g., wheelchair or bedside chair)?: A Little Help needed to walk in hospital room?: A Little Help needed climbing 3-5 steps with a railing? : Total 6 Click Score: 17    End of Session Equipment Utilized During Treatment: Gait belt Activity Tolerance: Patient tolerated treatment well Patient left: in bed;with call bell/phone within reach;with bed alarm set Nurse Communication: Mobility status PT Visit Diagnosis: Unsteadiness on feet (R26.81);Muscle weakness (generalized) (M62.81);Difficulty in walking, not elsewhere classified (R26.2)     Time: 2119-4174 PT Time Calculation (min) (ACUTE ONLY):  36 min  Charges:  $Gait Training: 8-22 mins $Therapeutic Activity: 8-22 mins                Ramond Dial 10/14/2021, 1:47 PM  Mee Hives, PT PhD Acute Rehab Dept. Number: [place] [facility] [place]

## 2021-10-14 NOTE — Progress Notes (Signed)
Patient refusing medications. States he has already received them when he has not or states he has never taken them and is not going to start now.

## 2021-10-14 NOTE — Progress Notes (Signed)
Pharmacy: Dofetilide (Tikosyn) - Follow Up Assessment and Electrolyte Replacement  Pharmacy consulted to assist in monitoring and replacing electrolytes in this 87 y.o. male admitted on 10/10/2021 undergoing dofetilide continuation of home dose. First dofetilide dose inpatient: 10/29 @2200   Labs:    Component Value Date/Time   K 4.3 10/14/2021 0448   MG 1.9 10/14/2021 0448     Plan: Potassium: K >/= 4: No additional supplementation needed   Magnesium: Mg 1.8-2: Give Mg 2 gm IV x1    Thank you for allowing pharmacy to participate in this patient's care   Arrie Senate, PharmD, BCPS, [facility] Clinical Pharmacist [phone] Please check AMION for all [place] numbers 10/14/2021

## 2021-10-14 NOTE — Progress Notes (Signed)
Pt was discharged to [facility] via [facility] in fair condition .

## 2021-10-14 NOTE — Progress Notes (Signed)
[place] for warfarin Indication: atrial fibrillation  Allergies  Allergen Reactions   Grass Extracts [Gramineae Pollens] Other (See Comments)    Sneezing and runny nose   Milk-Related Compounds Other (See Comments)    Just Yogurt.  Makes him very lethargic.   Amiodarone Anxiety    depression   Patient Measurements: Height: 5\' 10"  (177.8 cm) Weight: 90.7 kg (199 lb 15.3 oz) IBW/kg (Calculated) : 73  Vital Signs: Temp: 98 F (36.7 C) (11/01 0333) Temp Source: Oral (11/01 0333) BP: 147/74 (11/01 0333) Pulse Rate: 55 (11/01 0333)  Labs: Recent Labs    10/12/21 0443 10/12/21 1623 10/13/21 0454 10/13/21 1645 10/14/21 0448  HGB 12.4*  --  12.4*  --  12.8*  HCT 38.8*  --  37.9*  --  38.4*  PLT 125*  --  143*  --  162  LABPROT 16.3*  --  18.6*  --  23.9*  INR 1.3*  --  1.6*  --  2.1*  CREATININE 1.11  --  1.10  --  1.05  CKTOTAL 8,225*   < > 5,152* 4,524* 2,938*   < > = values in this interval not displayed.     Estimated Creatinine Clearance: 56.2 mL/min (by C-G formula based on SCr of 1.05 mg/dL).   Medical History: Past Medical History:  Diagnosis Date   AAA (abdominal aortic aneurysm)    Atrial fibrillation (HCC)    Atrial fibrillation (HCC)    CAD (coronary artery disease)    Cataract    CHF (congestive heart failure) (HCC)    Chronic kidney disease    Colon polyps    adenomatous   Diabetes mellitus    Diverticulosis    Dysrhythmia    A-Fib   GERD (gastroesophageal reflux disease)    Gout    Hernia, abdominal    History of aortic valve replacement with bioprosthetic valve    2007   Hx: UTI (urinary tract infection)    Hyperlipemia    Hypertension    Internal hemorrhoids    Peripheral vascular disease (HCC)    Shortness of breath    with exertion    Medications:  Medications Prior to Admission  Medication Sig Dispense Refill Last Dose   allopurinol (ZYLOPRIM) 300 MG tablet Take 300 mg by mouth daily.    2 10/09/2021   atorvastatin (LIPITOR) 10 MG tablet TAKE ONE TABLET DAILY (Patient taking differently: Take 10 mg by mouth daily.) 30 tablet 11 10/09/2021   atorvastatin (LIPITOR) 20 MG tablet Take 20 mg by mouth every other day. Along with 10 mg to total 30 mg on alternate days   10/08/2021   atorvastatin (LIPITOR) 20 MG tablet Take 20 mg by mouth daily.   10/09/2021   Coenzyme Q10 (CO Q10) 200 MG CAPS Take 200 mg by mouth daily.   10/09/2021   dofetilide (TIKOSYN) 250 MCG capsule TAKE ONE CAPSULE TWICE A DAY (Patient taking differently: Take 250 mcg by mouth 2 (two) times daily.) 60 capsule 11 10/09/2021   empagliflozin (JARDIANCE) 10 MG TABS tablet Take 10 mg by mouth daily.   10/08/2021   Evolocumab (REPATHA SURECLICK) 932 MG/ML SOAJ Inject 1 pen into the skin every 14 (fourteen) days. (Patient taking differently: Inject 140 mg into the skin every 14 (fourteen) days.) 2 mL 11 10/08/2021   ezetimibe (ZETIA) 10 MG tablet Take 1 tablet (10 mg total) by mouth at bedtime. 30 tablet 3 10/08/2021   furosemide (LASIX) 20 MG tablet Take  20-40 mg by mouth as directed. Rotating every other day; 20mg  one day and 40mg  the next day   10/09/2021   latanoprost (XALATAN) 0.005 % ophthalmic solution Place 1 drop into both eyes daily.   10/09/2021   Metoprolol Succinate 25 MG CS24 Take 25 mg by mouth 2 (two) times daily.   10/09/2021 at 9 am   Misc Natural Products (GLUCOSAMINE CHONDROITIN TRIPLE) TABS Take 1 tablet by mouth daily.   10/08/2021   ramipril (ALTACE) 2.5 MG capsule Take 1 capsule (2.5 mg total) by mouth 2 (two) times daily. (Patient taking differently: Take 5 mg by mouth daily.) 90 capsule 3 10/09/2021   spironolactone (ALDACTONE) 25 MG tablet TAKE ONE TABLET DAILY (Patient taking differently: Take 25 mg by mouth daily.) 30 tablet 3 10/08/2021   tamsulosin (FLOMAX) 0.4 MG CAPS capsule Take 0.4 mg by mouth 2 (two) times daily.   10/09/2021   timolol (TIMOPTIC) 0.5 % ophthalmic solution Place 1 drop into  both eyes at bedtime.  5 10/09/2021   warfarin (COUMADIN) 3 MG tablet TAKE 3 TABLETS DAILY EXCEPT 2 TABLETS ONMONDAY AND FRIDAY OR AS DIRECTED BY CLINIC (Patient taking differently: Take 6-9 mg by mouth See admin instructions. Takes 6 mg on Monday and Friday ,then 9 mg on all other days) 90 tablet 2 10/09/2021 at 9 am    Assessment: 39 yom with PMH significant for CAD s/p CABG, bioprosthetic aortic valve, afib, PVD, and DM. Patient presents s/p fall at home, no loss of consciousness or head injury. Patient on warfarin PTA.   PTA Dosing: 6MG  MF and 9MG  AOD (Weekly Total 57 MG)   INR today has trended up from 1.6 to 2.1. Will give slightly reduced dose from home regimen to prevent supratherapeutic rise. CBC stable.  Goal of Therapy:  INR 2-3 Monitor platelets by anticoagulation protocol: Yes   Plan:  Warfarin 6mg  x1 today  Daily INR  Arrie Senate, PharmD, BCPS, [facility] Clinical Pharmacist [phone] Please check AMION for all [place] numbers 10/14/2021

## 2021-10-14 NOTE — Progress Notes (Addendum)
Advanced Heart Failure Rounding Note  PCP-Cardiologist: None   Subjective:    Admitted after fall at home. Had been down 6 hr. Now w/ rhabdomyolysis and found to have Ecoli UTI. Missed several doses of Tikosyn. Starting reload. EP following.   On Tikosyn 250 mcg bid. In NSR w/ few PVCs. QT/QTc on last night's EKG stable, 472/451ms K 4.3 Mg 1.9   CK continues to trend down, 12,793 on admit>>>5,152>>2,938 today  SCr normal 1.05   Lasix and spiro added back yesterday. Off jardiance w/ UTI.   Toprol reduced yesterday from BID to once daily given bradycardia. HR improved, currently in 60s this am. BP moderately elevated 140s-160s.    Objective:   Weight Range: 90.7 kg Body mass index is 28.69 kg/m.   Vital Signs:   Temp:  [97.6 F (36.4 C)-98 F (36.7 C)] 97.6 F (36.4 C) (11/01 0731) Pulse Rate:  [55-75] 75 (11/01 0731) Resp:  [16-19] 19 (11/01 0731) BP: (135-164)/(63-95) 164/75 (11/01 0731) SpO2:  [90 %-99 %] 91 % (11/01 0731) Weight:  [90.7 kg] 90.7 kg (11/01 0333) Last BM Date: 10/10/21  Weight change: Filed Weights   10/10/21 1909 10/13/21 0413 10/14/21 0333  Weight: 89.4 kg 90 kg 90.7 kg    Intake/Output:   Intake/Output Summary (Last 24 hours) at 10/14/2021 0745 Last data filed at 10/14/2021 0604 Gross per 24 hour  Intake 1044.18 ml  Output 1820 ml  Net -775.82 ml      Physical Exam   General:  Well appearing elderly WM. No respiratory difficulty HEENT: normal Neck: supple. JVD 8 cm. Carotids 2+ bilat; no bruits. No lymphadenopathy or thyromegaly appreciated. Cor: PMI nondisplaced. Regular rate & rhythm. No rubs, gallops or murmurs. Lungs: clear Abdomen: soft, nontender, nondistended. No hepatosplenomegaly. No bruits or masses. Good bowel sounds. Extremities: no cyanosis, clubbing, rash, edema Neuro: alert & oriented x 3, cranial nerves grossly intact. moves all 4 extremities w/o difficulty. Affect pleasant.   Telemetry   NSR 60s w/  occasional PVCs   EKG    EKG 10/31 SB 57 bpm, incomplete LBBB QT/QTcB 472/459 ms  Labs    CBC Recent Labs    10/11/21 0847 10/12/21 0443 10/13/21 0454 10/14/21 0448  WBC 16.0*   < > 7.6 7.2  NEUTROABS 13.0*  --   --   --   HGB 13.5   < > 12.4* 12.8*  HCT 39.9   < > 37.9* 38.4*  MCV 92.1   < > 92.4 92.1  PLT 147*   < > 143* 162   < > = values in this interval not displayed.   Basic Metabolic Panel Recent Labs    10/13/21 0454 10/14/21 0448  NA 135 136  K 3.7 4.3  CL 102 101  CO2 26 28  GLUCOSE 158* 122*  BUN 16 15  CREATININE 1.10 1.05  CALCIUM 8.2* 8.5*  MG 1.9 1.9   Liver Function Tests Recent Labs    10/11/21 0847  AST 203*  ALT 50*  ALKPHOS 58  BILITOT 2.3*  PROT 5.7*  ALBUMIN 3.0*   No results for input(s): LIPASE, AMYLASE in the last 72 hours. Cardiac Enzymes Recent Labs    10/13/21 0454 10/13/21 1645 10/14/21 0448  CKTOTAL 5,152* 4,524* 2,938*    BNP: BNP (last 3 results) Recent Labs    01/01/21 1313 02/20/21 0949  BNP 183.8* 217.9*    ProBNP (last 3 results) No results for input(s): PROBNP in the last 8760 hours.  D-Dimer No results for input(s): DDIMER in the last 72 hours. Hemoglobin A1C No results for input(s): HGBA1C in the last 72 hours. Fasting Lipid Panel No results for input(s): CHOL, HDL, LDLCALC, TRIG, CHOLHDL, LDLDIRECT in the last 72 hours. Thyroid Function Tests No results for input(s): TSH, T4TOTAL, T3FREE, THYROIDAB in the last 72 hours.  Invalid input(s): FREET3  Other results:   Imaging    No results found.   Medications:     Scheduled Medications:  Chlorhexidine Gluconate Cloth  6 each Topical Daily   dofetilide  250 mcg Oral BID   ezetimibe  10 mg Oral QHS   furosemide  40 mg Oral Daily   insulin aspart  0-9 Units Subcutaneous TID WC   latanoprost  1 drop Both Eyes QHS   magnesium oxide  800 mg Oral BID   metoprolol succinate  25 mg Oral Daily   potassium chloride  40 mEq Oral BID    sodium chloride flush  3 mL Intravenous Q12H   spironolactone  25 mg Oral Daily   tamsulosin  0.4 mg Oral BID   timolol  1 drop Both Eyes Daily   warfarin  6 mg Oral ONCE-1600   Warfarin - Pharmacist Dosing Inpatient   Does not apply q1600    Infusions:  sodium chloride     cefTRIAXone (ROCEPHIN)  IV 1 g (10/13/21 2327)   magnesium sulfate bolus IVPB      PRN Medications: sodium chloride, melatonin, sodium chloride flush   Assessment/Plan   1. Rhabdomyolysis: Weakness leading to fall at home, may have been related to E coli UTI. Down x 6 hr. Initial CK 12,793. No AKI. Improving w/ IVFs. CK trending down, 2,938 today  - management per IM  2. Atrial fibrillation: Possible CNS side effects with amiodarone.  Given worsening CHF with atrial fibrillation and difficulty with rate control, I think that we need to maintain NSR.  He is on Tikosyn. Several breakthrough episodes in the last 6 months.  He saw EP and was offered ablation but decided against it. He is currently in NSR/SB.  Missed several doses of Tikosyn recently given fall at home. Down x 6 hrs.  - Reloading Tikosyn and currently in NSR. C/w 250 mgc bid. QT/QTc stable  472/459 ms. EP following  - Serial EKGs for QT monitoring  - Keep K > 4.0 and Mg > 2.0  - Continue warfarin. INR 2.1 today  - Continue Toprol XL 25 mg daily  - Avoid OTC stimulants (had AF with use of [name]).  3. Chronic Primarily Diastolic CHF: Suspect ischemic cardiomyopathy.  EF 25-30% in 6/15 but had normalized to 55-60% by 1/16.  Down to 45-50% on 1/19 echo but was in the setting of atrial fibrillation.  TEE in 6/20 showed EF 50%. TEE in 2/22 showed EF 45-50%.  NYHA class II-III symptoms, worse in atrial fibrillation.  He tolerates AF poorly, with worsening of HF symptoms. On Tikosyn for rhythm control, in SR. Diuretics and Jardiance initially held on admit.  - PO Lasix restarted, continue 40 mg daily  - continue spiro 25 mg  - continue Toprol XL 25 mg  daily  - restart ramipril 2.5 mg bid  - Will stay off Jardiance w/ UTI  4. CAD: s/p CABG and redo CABG.  Given fall in EF, I did a cardiac cath in 7/15 as documented above. Unfortunately, despite significant coronary disease, there were no good interventional targets.  He denies recent chest pain.  - Zetia,  atorvastatin on hold with elevated CK.  He is on Repatha at home.  - He is on warfarin in setting of stable CAD so does not need ASA 81.   5. Hyperlipidemia: He is taking atorvastatin + Zetia and Repatha but has not been consistent w/ Repatha use. Recent LP 10/22 w/ elevated LDL 113 - Needs to improve compliance w/ Repatha at take every 2 wks 6. PAD: Patient denies claudication or foot ulcers. Peripheral arterial dopplers showed > 50% bilateral focal SFA stenoses in the past.  He is not a cilostazol candidate with CHF. He is followed by Dr. Trula Slade at [facility], had recent appt. No current claudication symptoms.  - Has appointment at [facility] in 12/22.  7. Carotid stenosis: S/p right CEA in 2/19.  Followed by Dr. Trula Slade at [facility]. Carotid Dopplers completed 11/21-->bilateral ICA without significant stenosis but >50% common carotid stenosis on left. - Followup at [facility] in 12/22.  8. AAA: Successful endovascular repair in 5/15.  Followed by Dr. Trula Slade at [facility].  9. Bioprosthetic AVR: Valve stable on 2/22 TEE.  10. HTN: Moderately elevated this am. Renal fx stable - restart ramipril 2.5 mg bid  11. Diabetes: was on Jardiance PTA. This has been discontinued given UTI. - insulin per IM  12. OSA: Daytime sleepiness and fatigue. He says that he will not use CPAP so we will defer sleep study.  13. E coli UTI: Getting IV ceftriaxone. [place] .   Length of Stay: [address] PA-C  10/14/2021, 7:45 AM  Advanced Heart Failure Team Pager [phone] (M-F; 7a - 5p)  Please contact [facility] for night-coverage after hours (5p -7a ) and weekends on [email or web address]  Patient seen with PA, agree with the  above note.   CK continues to trend down.  He remains in NSR with stable QTc.  Feels stronger today.  BP elevated.   General: NAD Neck: JVP 8 cm, no thyromegaly or thyroid nodule.  Lungs: Clear to auscultation bilaterally with normal respiratory effort. CV: Nondisplaced PMI.  Heart regular S1/S2, no S3/S4, no murmur.  No peripheral edema.   Abdomen: Soft, nontender, no hepatosplenomegaly, no distention.  Skin: Intact without lesions or rashes.  Neurologic: Alert and oriented x 3.  Psych: Normal affect. Extremities: No clubbing or cyanosis.  HEENT: Normal.   Restart home ramipril today with stable creatinine and elevated BP.  Will stay off Jardiance with UTI.  Continue home Lasix.   Should be able to restart atorvastatin at discharge with downtrending CK (traumatic rhabdo, not due to statin).   He remains in NSR on dofetilide, QTc ok today. No further QTc monitoring needed after this morning's dose.   Antibiotic course for complicated E coli UTI per internal med.   Weak, would recommend rehab stay.   I think he is ready for discharge to SNF/rehab facility when available.  Will need followup with me.  Cardiac meds for discharge: ramipril 2.5 bid, Lasix 40 mg daily, dofetilide 250 bid, spironolactone 25 daily, Zetia 10, restart atorvastatin, Toprol XL 25 bid, Repatha, warfarin.   [name] 10/14/2021 12:03 PM

## 2021-10-14 NOTE — Progress Notes (Addendum)
Electrophysiology Rounding Note  Patient Name: Nathaniel [name]. Date of Encounter: 10/14/2021  Primary Cardiologist: None  Electrophysiologist: Shaira Sova Meredith Leeds, MD    Subjective   Pt  remains in NSR  on Tikosyn 250 mcg BID   QTc from EKG last pm shows stable QTc at ~450 ms  The patient is doing well today.  At this time, the patient denies chest pain, shortness of breath, or any new concerns.  Inpatient Medications    Scheduled Meds:  Chlorhexidine Gluconate Cloth  6 each Topical Daily   dofetilide  250 mcg Oral BID   ezetimibe  10 mg Oral QHS   furosemide  40 mg Oral Daily   insulin aspart  0-9 Units Subcutaneous TID WC   latanoprost  1 drop Both Eyes QHS   magnesium oxide  800 mg Oral BID   metoprolol succinate  25 mg Oral Daily   potassium chloride  40 mEq Oral BID   sodium chloride flush  3 mL Intravenous Q12H   spironolactone  25 mg Oral Daily   tamsulosin  0.4 mg Oral BID   timolol  1 drop Both Eyes Daily   warfarin  6 mg Oral ONCE-1600   Warfarin - Pharmacist Dosing Inpatient   Does not apply q1600   Continuous Infusions:  sodium chloride     cefTRIAXone (ROCEPHIN)  IV 1 g (10/13/21 2327)   magnesium sulfate bolus IVPB     PRN Meds: sodium chloride, melatonin, sodium chloride flush   Vital Signs    Vitals:   10/13/21 1354 10/13/21 2333 10/14/21 0333 10/14/21 0731  BP: (!) 145/83 (!) 135/95 (!) 147/74 (!) 164/75  Pulse: (!) 57 (!) 57 (!) 55 75  Resp: 18  16 19   Temp: 98 F (36.7 C)  98 F (36.7 C) 97.6 F (36.4 C)  TempSrc: Oral  Oral Oral  SpO2:  94% 90% 91%  Weight:   90.7 kg   Height:        Intake/Output Summary (Last 24 hours) at 10/14/2021 0734 Last data filed at 10/14/2021 0604 Gross per 24 hour  Intake 1044.18 ml  Output 1820 ml  Net -775.82 ml   Filed Weights   10/10/21 1909 10/13/21 0413 10/14/21 0333  Weight: 89.4 kg 90 kg 90.7 kg    Physical Exam    GEN- The patient is well appearing, alert and oriented x 3 today.    Head- normocephalic, atraumatic Eyes-  Sclera clear, conjunctiva pink Ears- hearing intact Oropharynx- clear Neck- supple Lungs- Clear to ausculation bilaterally, normal work of breathing Heart- Regular rate and rhythm, no murmurs, rubs or gallops GI- soft, NT, ND, + BS Extremities- no clubbing, cyanosis, or edema Skin- no rash or lesion Psych- euthymic mood, full affect Neuro- strength and sensation are intact  Labs    CBC Recent Labs    10/11/21 0847 10/12/21 0443 10/13/21 0454 10/14/21 0448  WBC 16.0*   < > 7.6 7.2  NEUTROABS 13.0*  --   --   --   HGB 13.5   < > 12.4* 12.8*  HCT 39.9   < > 37.9* 38.4*  MCV 92.1   < > 92.4 92.1  PLT 147*   < > 143* 162   < > = values in this interval not displayed.   Basic Metabolic Panel Recent Labs    10/13/21 0454 10/14/21 0448  NA 135 136  K 3.7 4.3  CL 102 101  CO2 26 28  GLUCOSE 158*  122*  BUN 16 15  CREATININE 1.10 1.05  CALCIUM 8.2* 8.5*  MG 1.9 1.9    Potassium  Date/Time Value Ref Range Status  10/14/2021 04:48 AM 4.3 3.5 - 5.1 mmol/L Final   Magnesium  Date/Time Value Ref Range Status  10/14/2021 04:48 AM 1.9 1.7 - 2.4 mg/dL Final    Comment:    Performed at [facility], [place] [address], [place], [place] [address]    Telemetry    Sinus brady/NSR 50-70s mostly (personally reviewed)  Radiology    No results found.   Patient Profile     Nathaniel [name]. is a 87 y.o. male with a past medical history significant for persistent atrial fibrillation.  They were admitted for fall and rhabdo. Cardiology consulted for tikosyn resumption. .   Assessment & Plan    Persistent atrial fibrillation Pt  remains in NSR  on Tikosyn 250 mcg BID  Continue Coumadin K 4.3. Mg 1.9. Continue po Mg CHA2DS2-VASc is at least 6.   Nathaniel [name] Nathaniel [name] not require any further tikosyn monitoring after this am dose if QTc remains stable and may be discharged on his PTA dose of 250 mcg BID.   Outpatient follow up made.   For  questions or updates, please contact [place] Please consult [email or web address] for contact info under Cardiology/STEMI.  Signed, Shirley Friar, PA-C  10/14/2021, 7:34 AM   I have seen and examined this patient with Nathaniel [name].  Agree with above, note added to reflect my findings.  On exam, RRR, no murmurs.  Patient status post dofetilide load.  QTC is remained stable.  Nathaniel [name] has had his sixth dose.  No further Tikosyn monitoring is needed.  Outpatient follow-up has been made.  EP to sign off.  Nathaniel [name] M. Tighe Gitto MD 10/14/2021 12:11 PM

## 2021-10-14 NOTE — Progress Notes (Signed)
Bladder scan done & obtained 435 cc.Dr.Gardner was notified & ordered to do in & out .Was about to do in & out but pt.said he has to void first.He  voided 100 cc & bladder scan done again & obtained 259cc.Will endorse to day shift nurse to notify MD if he want to order in & out.

## 2021-10-16 DIAGNOSIS — R262 Difficulty in walking, not elsewhere classified: Secondary | ICD-10-CM | POA: Diagnosis not present

## 2021-10-16 DIAGNOSIS — N400 Enlarged prostate without lower urinary tract symptoms: Secondary | ICD-10-CM | POA: Diagnosis not present

## 2021-10-16 DIAGNOSIS — T796XXA Traumatic ischemia of muscle, initial encounter: Secondary | ICD-10-CM | POA: Diagnosis not present

## 2021-10-16 DIAGNOSIS — R338 Other retention of urine: Secondary | ICD-10-CM | POA: Diagnosis not present

## 2021-10-16 DIAGNOSIS — R5381 Other malaise: Secondary | ICD-10-CM | POA: Diagnosis not present

## 2021-10-16 DIAGNOSIS — E1159 Type 2 diabetes mellitus with other circulatory complications: Secondary | ICD-10-CM | POA: Diagnosis not present

## 2021-10-16 DIAGNOSIS — I509 Heart failure, unspecified: Secondary | ICD-10-CM | POA: Diagnosis not present

## 2021-10-16 DIAGNOSIS — I480 Paroxysmal atrial fibrillation: Secondary | ICD-10-CM | POA: Diagnosis not present

## 2021-10-16 DIAGNOSIS — N390 Urinary tract infection, site not specified: Secondary | ICD-10-CM | POA: Diagnosis not present

## 2021-10-16 DIAGNOSIS — I152 Hypertension secondary to endocrine disorders: Secondary | ICD-10-CM | POA: Diagnosis not present

## 2021-10-16 DIAGNOSIS — B9620 Unspecified Escherichia coli [E. coli] as the cause of diseases classified elsewhere: Secondary | ICD-10-CM | POA: Diagnosis not present

## 2021-10-21 NOTE — Progress Notes (Signed)
PCP:  Lorne Skeens, MD Primary Cardiologist: None Electrophysiologist: Nathaniel Meredith Leeds, MD   [name] is a 87 y.o. male seen today for Nathaniel Meredith Leeds, MD for post hospital follow up.    Admitted 10/28 - 11/1 after fall with prolonged down time leading to [place]. Had +CK without AKI. Tikosyn was missed for four doses and we monitored while in hospital for resumption. QTc remained stable.   Since discharge from hospital the patient reports doing well.  he denies chest pain, palpitations, dyspnea, PND, orthopnea, nausea, vomiting, dizziness, syncope, edema, weight gain, or early satiety.  Past Medical History:  Diagnosis Date   AAA (abdominal aortic aneurysm)    Atrial fibrillation (HCC)    Atrial fibrillation (HCC)    CAD (coronary artery disease)    Cataract    CHF (congestive heart failure) (HCC)    Chronic kidney disease    Colon polyps    adenomatous   Diabetes mellitus    Diverticulosis    Dysrhythmia    A-Fib   GERD (gastroesophageal reflux disease)    Gout    Hernia, abdominal    History of aortic valve replacement with bioprosthetic valve    2007   Hx: UTI (urinary tract infection)    Hyperlipemia    Hypertension    Internal hemorrhoids    Peripheral vascular disease (HCC)    Shortness of breath    with exertion   Past Surgical History:  Procedure Laterality Date   ABDOMINAL AORTIC ENDOVASCULAR STENT GRAFT N/A 04/13/2014   Procedure: ABDOMINAL AORTIC ENDOVASCULAR STENT GRAFT;  Surgeon: Serafina Mitchell, MD;  Location: [facility];  Service: Vascular;  Laterality: N/A;   ANGIOPLASTY     Unsure if he had stents put in    [place] N/A 06/06/2014   Procedure: CARDIOVERSION;  Surgeon: Larey Dresser, MD;  Location: [facility];  Service: Cardiovascular;  Laterality: N/A;   CARDIOVERSION N/A 08/21/2014   Procedure: CARDIOVERSION;  Surgeon: Larey Dresser, MD;  Location: [facility];  Service:  Cardiovascular;  Laterality: N/A;   CARDIOVERSION N/A 12/22/2017   Procedure: CARDIOVERSION;  Surgeon: Larey Dresser, MD;  Location: [place];  Service: Cardiovascular;  Laterality: N/A;   CARDIOVERSION N/A 05/25/2019   Procedure: CARDIOVERSION;  Surgeon: Larey Dresser, MD;  Location: [place];  Service: Cardiovascular;  Laterality: N/A;   CARDIOVERSION N/A 06/14/2019   Procedure: CARDIOVERSION;  Surgeon: Jerline Pain, MD;  Location: [facility] ENDOSCOPY;  Service: Cardiovascular;  Laterality: N/A;   CARDIOVERSION N/A 12/12/2020   Procedure: CARDIOVERSION;  Surgeon: Larey Dresser, MD;  Location: [place];  Service: Cardiovascular;  Laterality: N/A;   CARDIOVERSION N/A 01/29/2021   Procedure: CARDIOVERSION;  Surgeon: Larey Dresser, MD;  Location: [place];  Service: Cardiovascular;  Laterality: N/A;   CORONARY ARTERY BYPASS GRAFT     ENDARTERECTOMY Right 02/02/2018   Procedure: ENDARTERECTOMY CAROTID RIGHT;  Surgeon: Serafina Mitchell, MD;  Location: [facility] OR;  Service: Vascular;  Laterality: Right;   EYE SURGERY Bilateral    cataract surgery   HERNIA REPAIR     2 operations   LEFT HEART CATHETERIZATION WITH CORONARY/GRAFT ANGIOGRAM N/A 07/10/2014   Procedure: LEFT HEART CATHETERIZATION WITH Beatrix Fetters;  Surgeon: Larey Dresser, MD;  Location[facility] CATH LAB;  Service: Cardiovascular;  Laterality: N/A;   TEE WITHOUT CARDIOVERSION N/A 05/25/2019   Procedure: TRANSESOPHAGEAL ECHOCARDIOGRAM (TEE);  Surgeon: [name]  S, MD;  Location: [place];  Service: Cardiovascular;  Laterality: N/A;   TEE WITHOUT CARDIOVERSION N/A 01/29/2021   Procedure: TRANSESOPHAGEAL ECHOCARDIOGRAM (TEE);  Surgeon: Larey Dresser, MD;  Location: [facility]OSCOPY;  Service: Cardiovascular;  Laterality: N/A;   TONSILLECTOMY     VIDEO BRONCHOSCOPY Bilateral 12/25/2015   Procedure: VIDEO BRONCHOSCOPY WITHOUT FLUORO;  Surgeon: Javier Glazier, MD;  Location: [place];  Service:  Cardiopulmonary;  Laterality: Bilateral;    Current Outpatient Medications  Medication Sig Dispense Refill   allopurinol (ZYLOPRIM) 300 MG tablet Take 300 mg by mouth daily.   2   atorvastatin (LIPITOR) 10 MG tablet TAKE ONE TABLET DAILY (Patient taking differently: Take 10 mg by mouth daily.) 30 tablet 11   atorvastatin (LIPITOR) 20 MG tablet Take 20 mg by mouth every other day. Along with 10 mg to total 30 mg on alternate days     atorvastatin (LIPITOR) 20 MG tablet Take 20 mg by mouth daily.     cephALEXin (KEFLEX) 500 MG capsule Take 1 capsule (500 mg total) by mouth 2 (two) times daily for 7 days. 20 capsule 0   Coenzyme Q10 (CO Q10) 200 MG CAPS Take 200 mg by mouth daily.     dofetilide (TIKOSYN) 250 MCG capsule TAKE ONE CAPSULE TWICE A DAY (Patient taking differently: Take 250 mcg by mouth 2 (two) times daily.) 60 capsule 11   Evolocumab (REPATHA SURECLICK) 956 MG/ML SOAJ Inject 1 pen into the skin every 14 (fourteen) days. (Patient taking differently: Inject 140 mg into the skin every 14 (fourteen) days.) 2 mL 11   ezetimibe (ZETIA) 10 MG tablet Take 1 tablet (10 mg total) by mouth at bedtime. 30 tablet 3   furosemide (LASIX) 40 MG tablet Take 1 tablet (40 mg total) by mouth daily. 30 tablet    latanoprost (XALATAN) 0.005 % ophthalmic solution Place 1 drop into both eyes daily.     Metoprolol Succinate 25 MG CS24 Take 25 mg by mouth 2 (two) times daily.     Misc Natural Products (GLUCOSAMINE CHONDROITIN TRIPLE) TABS Take 1 tablet by mouth daily.     ramipril (ALTACE) 2.5 MG capsule Take 1 capsule (2.5 mg total) by mouth 2 (two) times daily. (Patient taking differently: Take 5 mg by mouth daily.) 90 capsule 3   spironolactone (ALDACTONE) 25 MG tablet TAKE ONE TABLET DAILY (Patient taking differently: Take 25 mg by mouth daily.) 30 tablet 3   tamsulosin (FLOMAX) 0.4 MG CAPS capsule Take 0.4 mg by mouth 2 (two) times daily.     timolol (TIMOPTIC) 0.5 % ophthalmic solution Place 1 drop into  both eyes at bedtime.  5   warfarin (COUMADIN) 3 MG tablet TAKE 3 TABLETS DAILY EXCEPT 2 TABLETS ONMONDAY AND FRIDAY OR AS DIRECTED BY CLINIC (Patient taking differently: Take 6-9 mg by mouth See admin instructions. Takes 6 mg on Monday and Friday ,then 9 mg on all other days) 90 tablet 2   No current facility-administered medications for this visit.    Allergies  Allergen Reactions   Grass Extracts [Gramineae Pollens] Other (See Comments)    Sneezing and runny nose   Milk-Related Compounds Other (See Comments)    Just Yogurt.  Makes him very lethargic.   Amiodarone Anxiety    depression    Social History   Socioeconomic History   Marital status: Widowed    Spouse name: Not on file   Number of children: Not on file   Years of education: Not on  file   Highest education level: Not on file  Occupational History   Occupation: [employer]   Tobacco Use   Smoking status: Former    Packs/day: 0.25    Years: 20.00    Pack years: 5.00    Types: Cigarettes    Quit date: 03/23/1992    Years since quitting: 29.6   Smokeless tobacco: Never  Vaping Use   Vaping Use: Never used  Substance and Sexual Activity   Alcohol use: Yes    Alcohol/week: 10.0 standard drinks    Types: 10 Glasses of wine per week    Comment: 1-2 drinks daily    Drug use: No   Sexual activity: Not Currently  Other Topics Concern   Not on file  Social History Narrative   1 caffeine drink daily    Social Determinants of Health   Financial Resource Strain: Low Risk    Difficulty of Paying Living Expenses: Not very hard  Food Insecurity: No Food Insecurity   Worried About Charity fundraiser in the Last Year: Never true   Ran Out of Food in the Last Year: Never true  Transportation Needs: No Transportation Needs   Lack of Transportation (Medical): No   Lack of Transportation (Non-Medical): No  Physical Activity: Not on file  Stress: Not on file  Social Connections: Not on file  Intimate Partner  Violence: Not on file     Review of Systems: All other systems reviewed and are otherwise negative except as noted above.  Physical Exam: There were no vitals filed for this visit.  GEN- The patient is well appearing, alert and oriented x 3 today.   HEENT: normocephalic, atraumatic; sclera clear, conjunctiva pink; hearing intact; oropharynx clear; neck supple, no JVP Lymph- no cervical lymphadenopathy Lungs- Clear to ausculation bilaterally, normal work of breathing.  No wheezes, rales, rhonchi Heart- Regular rate and rhythm, no murmurs, rubs or gallops, PMI not laterally displaced GI- soft, non-tender, non-distended, bowel sounds present, no hepatosplenomegaly Extremities- no clubbing, cyanosis, or edema; DP/PT/radial pulses 2+ bilaterally MS- no significant deformity or atrophy Skin- warm and dry, no rash or lesion Psych- euthymic mood, full affect Neuro- strength and sensation are intact  EKG is ordered. Personal review of EKG from today shows NSR with stable QTc  Additional studies reviewed include: Previous EP office and recent admission notes.   Assessment and Plan:  1. Persistent atrial fibrillation EKG today shows NSR with stable QTc back on tikosyn Continue tikosyn 250 mcg BID Countinue coumadin for CHA2DS2VASC is at least 6. Labs today.  2. CAD s/p CABG Denies s/s ischemia  3. PAD  Followed by vascular  4. Chronic systolic CHF Follows with Dr. Aundra Dubin   5. Urinary Retention Following with Urology.  Follow up with Dr. Curt Bears in 3 months   Shirley Friar, PA-C  10/21/21 10:36 AM

## 2021-10-22 ENCOUNTER — Encounter: Payer: Self-pay | Admitting: Student

## 2021-10-22 ENCOUNTER — Ambulatory Visit (INDEPENDENT_AMBULATORY_CARE_PROVIDER_SITE_OTHER): Payer: Medicare Other | Admitting: Student

## 2021-10-22 ENCOUNTER — Other Ambulatory Visit: Payer: Self-pay

## 2021-10-22 VITALS — BP 112/64 | HR 77 | Ht 70.5 in | Wt 199.0 lb

## 2021-10-22 DIAGNOSIS — I4891 Unspecified atrial fibrillation: Secondary | ICD-10-CM

## 2021-10-22 DIAGNOSIS — I480 Paroxysmal atrial fibrillation: Secondary | ICD-10-CM | POA: Diagnosis not present

## 2021-10-22 DIAGNOSIS — I5022 Chronic systolic (congestive) heart failure: Secondary | ICD-10-CM | POA: Diagnosis not present

## 2021-10-22 DIAGNOSIS — R339 Retention of urine, unspecified: Secondary | ICD-10-CM

## 2021-10-22 DIAGNOSIS — I6523 Occlusion and stenosis of bilateral carotid arteries: Secondary | ICD-10-CM

## 2021-10-22 LAB — BASIC METABOLIC PANEL
BUN/Creatinine Ratio: 19 (ref 10–24)
BUN: 26 mg/dL (ref 8–27)
CO2: 25 mmol/L (ref 20–29)
Calcium: 9.4 mg/dL (ref 8.6–10.2)
Chloride: 100 mmol/L (ref 96–106)
Creatinine, Ser: 1.34 mg/dL — ABNORMAL HIGH (ref 0.76–1.27)
Glucose: 133 mg/dL — ABNORMAL HIGH (ref 70–99)
Potassium: 5.3 mmol/L — ABNORMAL HIGH (ref 3.5–5.2)
Sodium: 137 mmol/L (ref 134–144)
eGFR: 51 mL/min/{1.73_m2} — ABNORMAL LOW (ref >59)

## 2021-10-22 LAB — MAGNESIUM: Magnesium: 2 mg/dL (ref 1.6–2.3)

## 2021-10-22 NOTE — Patient Instructions (Signed)
Medication Instructions:  Your physician recommends that you continue on your current medications as directed. Please refer to the Current Medication list given to you today.  *If you need a refill on your cardiac medications before your next appointment, please call your pharmacy*   Lab Work: TODAY: BMET, Mg  If you have labs (blood work) drawn today and your tests are completely normal, you will receive your results only by: [place] (if you have MyChart) OR A paper copy in the mail If you have any lab test that is abnormal or we need to change your treatment, we will call you to review the results.   Follow-Up: At [facility], you and your health needs are our priority.  As part of our continuing mission to provide you with exceptional heart care, we have created designated Provider Care Teams.  These Care Teams include your primary Cardiologist (physician) and Advanced Practice Providers (APPs -  Physician Assistants and Nurse Practitioners) who all work together to provide you with the care you need, when you need it.  We recommend signing up for the patient portal called "MyChart".  Sign up information is provided on this After Visit Summary.  MyChart is used to connect with patients for Virtual Visits (Telemedicine).  Patients are able to view lab/test results, encounter notes, upcoming appointments, etc.  Non-urgent messages can be sent to your provider as well.   To learn more about what you can do with MyChart, go to [email or web address].    Your next appointment:   3 month(s)  The format for your next appointment:   In Person  Provider:   You may see Will Meredith Leeds, MD or one of the following Advanced Practice Providers on your designated Care Team:   Tommye Standard, [place] [name] "[facility]" [place], [place]

## 2021-10-23 ENCOUNTER — Other Ambulatory Visit: Payer: Self-pay

## 2021-10-23 DIAGNOSIS — I480 Paroxysmal atrial fibrillation: Secondary | ICD-10-CM

## 2021-10-23 MED ORDER — SPIRONOLACTONE 25 MG PO TABS: 12.5000 mg | ORAL_TABLET | Freq: Every day | ORAL | 3 refills | Status: DC

## 2021-10-23 NOTE — Addendum Note (Signed)
Addended by: Glean Salen on: 10/23/2021 03:56 PM   Modules accepted: Orders

## 2021-10-30 ENCOUNTER — Ambulatory Visit (HOSPITAL_COMMUNITY)
Admit: 2021-10-30 | Discharge: 2021-10-30 | Disposition: A | Discharge status: Home / Self Care | Payer: Medicare Other | Attending: Cardiology | Admitting: Cardiology

## 2021-10-30 ENCOUNTER — Encounter (HOSPITAL_COMMUNITY): Payer: Self-pay | Admitting: Cardiology

## 2021-10-30 ENCOUNTER — Other Ambulatory Visit: Payer: Self-pay

## 2021-10-30 ENCOUNTER — Other Ambulatory Visit: Payer: Medicare Other

## 2021-10-30 VITALS — BP 118/68 | HR 61 | Wt 197.8 lb

## 2021-10-30 DIAGNOSIS — I5032 Chronic diastolic (congestive) heart failure: Secondary | ICD-10-CM | POA: Insufficient documentation

## 2021-10-30 DIAGNOSIS — I6523 Occlusion and stenosis of bilateral carotid arteries: Secondary | ICD-10-CM | POA: Diagnosis not present

## 2021-10-30 DIAGNOSIS — I110 Hypertensive heart disease with heart failure: Secondary | ICD-10-CM | POA: Insufficient documentation

## 2021-10-30 DIAGNOSIS — Z953 Presence of xenogenic heart valve: Secondary | ICD-10-CM | POA: Diagnosis not present

## 2021-10-30 DIAGNOSIS — I4891 Unspecified atrial fibrillation: Secondary | ICD-10-CM | POA: Diagnosis not present

## 2021-10-30 DIAGNOSIS — Z79899 Other long term (current) drug therapy: Secondary | ICD-10-CM | POA: Insufficient documentation

## 2021-10-30 DIAGNOSIS — Z8774 Personal history of (corrected) congenital malformations of heart and circulatory system: Secondary | ICD-10-CM | POA: Insufficient documentation

## 2021-10-30 DIAGNOSIS — E785 Hyperlipidemia, unspecified: Secondary | ICD-10-CM | POA: Insufficient documentation

## 2021-10-30 DIAGNOSIS — I739 Peripheral vascular disease, unspecified: Secondary | ICD-10-CM | POA: Insufficient documentation

## 2021-10-30 DIAGNOSIS — E119 Type 2 diabetes mellitus without complications: Secondary | ICD-10-CM | POA: Diagnosis not present

## 2021-10-30 DIAGNOSIS — Z9181 History of falling: Secondary | ICD-10-CM | POA: Diagnosis not present

## 2021-10-30 DIAGNOSIS — Z951 Presence of aortocoronary bypass graft: Secondary | ICD-10-CM | POA: Insufficient documentation

## 2021-10-30 DIAGNOSIS — I447 Left bundle-branch block, unspecified: Secondary | ICD-10-CM | POA: Diagnosis not present

## 2021-10-30 DIAGNOSIS — I480 Paroxysmal atrial fibrillation: Secondary | ICD-10-CM | POA: Diagnosis not present

## 2021-10-30 DIAGNOSIS — I2510 Atherosclerotic heart disease of native coronary artery without angina pectoris: Secondary | ICD-10-CM | POA: Insufficient documentation

## 2021-10-30 DIAGNOSIS — Z87440 Personal history of urinary (tract) infections: Secondary | ICD-10-CM | POA: Insufficient documentation

## 2021-10-30 DIAGNOSIS — Z7901 Long term (current) use of anticoagulants: Secondary | ICD-10-CM | POA: Diagnosis not present

## 2021-10-30 DIAGNOSIS — G4733 Obstructive sleep apnea (adult) (pediatric): Secondary | ICD-10-CM | POA: Insufficient documentation

## 2021-10-30 LAB — BASIC METABOLIC PANEL
BUN/Creatinine Ratio: 25 — ABNORMAL HIGH (ref 10–24)
BUN: 28 mg/dL — ABNORMAL HIGH (ref 8–27)
CO2: 26 mmol/L (ref 20–29)
Calcium: 9.2 mg/dL (ref 8.6–10.2)
Chloride: 100 mmol/L (ref 96–106)
Creatinine, Ser: 1.14 mg/dL (ref 0.76–1.27)
Glucose: 97 mg/dL (ref 70–99)
Potassium: 4.5 mmol/L (ref 3.5–5.2)
Sodium: 136 mmol/L (ref 134–144)
eGFR: 62 mL/min/{1.73_m2} (ref >59)

## 2021-10-30 NOTE — Patient Instructions (Signed)
Good to see you today  Lab work done today we will call you with abnormal results  No medication changes . Continue medications as before  Your physician recommends that you schedule a follow-up appointment in: 3 months  If you have any questions or concerns before your next appointment please send [place] a message through [place] or call our office at [phone].    TO LEAVE A MESSAGE FOR THE NURSE SELECT OPTION 2, PLEASE LEAVE A MESSAGE INCLUDING: YOUR NAME DATE OF BIRTH CALL BACK NUMBER REASON FOR CALL**this is important as we prioritize the call backs  YOU WILL RECEIVE A CALL BACK THE SAME DAY AS LONG AS YOU CALL BEFORE 4:00 PM  At the [facility], you and your health needs are our priority. As part of our continuing mission to provide you with exceptional heart care, we have created designated Provider Care Teams. These Care Teams include your primary Cardiologist (physician) and Advanced Practice Providers (APPs- Physician Assistants and Nurse Practitioners) who all work together to provide you with the care you need, when you need it.   You may see any of the following providers on your designated Care Team at your next follow up: Dr Glori Bickers Dr Haynes Kerns, NP Lyda Jester, [facility] [place], [place] Audry Riles, PharmD   Please be sure to bring in all your medications bottles to every appointment.

## 2021-10-30 NOTE — Progress Notes (Signed)
Patient ID: Nathaniel [name]., male   DOB: Jun 16, 1932, 87 y.o.   MRN: [id number] PCP: Dr. Elyse Hsu Cardiology: Dr. Aundra Dubin  87 y.o. with history of extensive vascular disease including CAD s/p CABG and redo CABG, AAA s/p repair, PAD, and carotid stenosis as well as prior AVR presents for followup of CAD and atrial fibrillation.   He has had documentation of significant peripheral arterial disease.  He gets bilateral calf soreness after walking for about 15 minutes.  This resolves with rest, no rest pain.  No pedal ulcers, though when he gets cuts on his feet, they heal slowly.  Peripheral arterial dopplers done in 10/14 showed > 50% focal SFA stenoses bilaterally.  He has carotid artery disease.  No stroke-like symptoms.  He had a endovascular AAA repair in 5/15.    After his AAA repair, he was noted to be in atrial fibrillation.  He had had a prior episode of atrial fibrillation after his cardiac surgery in 2007.  Atrial fibrillation was rate-controlled.  He was started on warfarin and Toprol XL.  He developed exertional dyspnea and fatigue in atrial fibrillation. I cardioverted him back to NSR on 06/06/14.  I also had him get an echocardiogram in 6/15.  This showed EF worsened to 25-30% with stable bioprosthetic aortic valve, mildly dilated RV with normal systolic function.  Given the fall in EF, I took him for [facility] in 7/15.  This showed stable anatomy.  The LIMA-LAD was patent, the sequential SVG-OM/ramus/D was patent only to ramus but this is the same as the prior cath, and the SVG-PDA was patent with a long 50-60% mid-graft stenosis.  No intervention.  Of note, at cath he was noted to be back in atrial fibrillation.  I then started him on amiodarone.  I took [name] for [place] again on 08/21/14.  This was successful.  He has been off amiodarone due to side effects (hallucinations).  Echo in 1/16 showed recovery of EF to 55-60%.    He was admitted in 1/19 with bronchitis/wheezing/dyspnea and was found to be in  atrial fibrillation with RVR.  He tested positive for metapneumovirus.  Echo showed EF 45-50% with normal bioprosthetic aortic valve. He was diuresed for volume overload while in the hospital. He had DCCV to NSR.    Carotid dopplers in 1/19 showed 80-99% RICA stenosis, he had right CEA in 4/94 with no complications.   At appointment in early 6/20, [name] was noted to be back in atrial fibrillation with RVR and mild CHF.  He was started on Toprol XL and Lasix was increased.  He had TEE-guided DCCV to NSR in 6/20.  TEE showed EF 50%, mild diffuse hypokinesis, mildly decreased RV systolic function, normally functioning bioprosthetic aortic valve.   In 2021, he was admitted for Tikosyn initiation.  In 12/21, he went into atrial fibrillation again and was cardioverted back to NSR.   Echo in 1/22 showed EF 45-50%, diffuse hypokinesis, mildly decreased RV systolic function, moderate biatrial enlargement, bioprosthetic valve with mean gradient 8 mmHg.    In 2/22, he went into atrial fibrillation and had TEE-guided DCCV back to NSR.  TEE showed EF 45-50%.    In 3/22, he was back in the ER with atrial fibrillation triggered by [name] most likely.  He had a cardioversion back to NSR.   In 10/22, patient developed urinary retention and an E coli UTI.  In the setting of this, he had a fall at home and was unable to  get up due to weakness for hours.  He developed rhabdomyolysis.  This resolved with minimal renal damage with IV fluid hydration and he was discharged to rehab.    He presents today for followup of CHF and atrial fibrillation.  He is now back home again.  He remains in NSR.  He is getting a LifeAlert device as he lives at home alone.  He is moving slower than in the past but denies dyspnea or chest pain with his usual activities.  No palpitations.  Weight down 3 lbs.   ECG: NSR, iLBBB inferolateral TWIs, QTc 418 msec (personally reviewed)  Labs (5/15): K 4.3, creatinine 0.89 Labs (6/15): K  4.3, creatinine 1.4, BNP 112 Labs (7/15): K 4.4, creatinine 1.2, HCT 40.2 Labs (8/15): K 4, creatinine 1.2, BNP 113, LFTs and TSH normal Labs (10/15): K 4.4, creatinine 1.4 Labs (5/16): K 4.7, creatinine 1.18, HDL 52, LDL 98 Labs (6/16): LDL 118, LFTs normal Labs (1/17): HCT 40.2, K 3.7, creatinine 1.04 Labs (1/19): K 4.9, creatinine 1.19 Labs (2/19): K 4.2, creatinine 0.89 Labs (3/19): LDL 86, HDL 45, TGs 245 Labs (5/19): LDL 110, HDL 39 Labs (6/19): K 4.1, creatinine 1.07 Labs (11/19): LDL 14, HDL 56 Labs (6/20): K 4.1, creatinine 1.0 Labs (7/20): K 4.3, creatinine 1.07 Labs (9/21): LDL 129, hgb 14.8, K 4.1, creatinine 1.02 Labs (12/21): LDL 20, HDL 46, K 4.4, creatinine 1.23 Labs (1/22): LDL 45, HDL 49, K 4.3, creatinine 1.1, BNP 184 Labs (3/22): K 4.4, creatinine 1.10, LDL 15, HDL 49 Labs (6/22): K 4.3, creatinine 1.11, LDL 121 Labs (7/22): K 4.3, creatinine 1.23, hgb 14.5 Labs (11/22): K 4.5, creatinine 1.14  PMH: 1. PAD: Peripheral arterial dopplers in 2012 showed > 75% bilateral SFA stenosis. Peripheral arterial dopplers in 10/14 showed > 50% focal bilateral SFA stenoses.  2. AAA: [place] 4/15 with 4.4 cm AAA but concern for penetrating ulcers. CTA abdomen showed 4.4 cm AAA with penetrating ulcers and possible pseudoaneurysms. Now s/p endovascular repair of AAA in 4/09 with no complications.  3. Carotid stenosis: Carotid dopplers (4/15) with 60-79% bilateral ICA stenosis. Carotid dopplers (11/15) with 60-79% bilateral ICA stenosis.  Carotid dopplers 6/16 with 60-79% bilateral ICA stenosis. - Carotid dopplers (1/18): 60-79% RICA stenosis.  - Carotid dopplers (1/19): 80-99% RICA stenosis => Right CEA in 2/19.  - Carotid dopplers (11/19): 40-59% bilateral stenosis.  - Carotid dopplers (1/21): 1-39% BICA stenosis.  4. CAD: CABG 1989 with LIMA-LAD, SVG-D, seq SVG-ramus and OM, sequential SVG-PDA/PLV. SVG-PDA/PLV found to be occluded on cath prior to AVR, so patient had SVG-PDA with AVR in  2007. LHC (7/15) with LIMA-LAD patent with 40-50% stenosis in LAD after touchdown, sequential SVG-ramus/OM/diagonal with only the ramus branch still intact (known from prior cath), SVG-PDA from original surgery TO, SVG-PDA from redo surgery with long 50-60% mid-graft stenosis.  No target for intervention.  5. Severe aortic stenosis: Bioprosthetic AVR in 2007.  6. Atrial fibrillation: Paroxysmal. Initially noted after cardiac surgery, then again after AAA repair.  DCCV to NSR 06/06/14.  DCCV to NSR again 08/21/14.  - DCCV to NSR in 1/19.  - DCCV to NSR in 6/20.  - Now on Tikosyn.  - DCCV to NSR 12/21.  - DCCV to NSR 2/22 - DCCV to NSR 3/22 7. Type II diabetes  8. Gout  9. GERD  10. Hyperlipidemia  11. HTN 12. GERD  13. Chronic systolic CHF: Echo (7/35) with EF worsened to 25-30% with grade II diastolic dysfunction, stable bioprosthetic aortic  valve, mildly dilated RV with normal systolic function.  Echo (1/16) with EF 55-60%, basal inferior hypokinesis, mild LVH, bioprosthetic aortic valve with mean gradient 13 mmHg, mild MR, severe LAE.  - Echo (1/19): EF 45-50%, mild MR, normal bioprosthetic aortic valve.  - TEE (6/20): EF 50% with mild diffuse hypokinesis, mildly decreased RV systolic function with mild RV enlargement, bioprosthetic aortic valve looked ok, mild MR. - Echo (1/22):  EF 45-50%, diffuse hypokinesis, mildly decreased RV systolic function, moderate biatrial enlargement, bioprosthetic valve with mean gradient 8 mmHg.   - TEE (2/22): EF 45-50%, RV normal, moderate biatrial enlargement, bioprosthetic AoV with mean gradient 6 mmHg, mild MR.  14. BPH  15. Lung nodules: Followed by pulmonary.  13. Fall with rhabdomyolysis (10/22)   SH: Widower, 2 children, raised his 3 grand-daughters, lives in [place], [place].  Occasional ETOH, no smoking.   FH: Father died from ruptured AAA.   ROS: All systems reviewed and negative except as per HPI.   Current Outpatient Medications   Medication Sig Dispense Refill   allopurinol (ZYLOPRIM) 300 MG tablet Take 300 mg by mouth daily.   2   atorvastatin (LIPITOR) 20 MG tablet Take 20 mg by mouth every other day. Along with 10 mg to total 30 mg on alternate days     atorvastatin (LIPITOR) 20 MG tablet Take 20 mg by mouth daily.     Coenzyme Q10 (CO Q10) 200 MG CAPS Take 200 mg by mouth daily.     dofetilide (TIKOSYN) 250 MCG capsule TAKE ONE CAPSULE TWICE A DAY 60 capsule 11   Evolocumab (REPATHA SURECLICK) 017 MG/ML SOAJ Inject 1 pen into the skin every 14 (fourteen) days. 2 mL 11   ezetimibe (ZETIA) 10 MG tablet Take 1 tablet (10 mg total) by mouth at bedtime. 30 tablet 3   furosemide (LASIX) 40 MG tablet Take 1 tablet (40 mg total) by mouth daily. 30 tablet    latanoprost (XALATAN) 0.005 % ophthalmic solution Place 1 drop into both eyes daily.     Metoprolol Succinate 25 MG CS24 Take 25 mg by mouth 2 (two) times daily.     Misc Natural Products (GLUCOSAMINE CHONDROITIN TRIPLE) TABS Take 1 tablet by mouth daily.     ramipril (ALTACE) 2.5 MG capsule Take 1 capsule (2.5 mg total) by mouth 2 (two) times daily. 90 capsule 3   spironolactone (ALDACTONE) 25 MG tablet Take 0.5 tablets (12.5 mg total) by mouth daily. 45 tablet 3   tamsulosin (FLOMAX) 0.4 MG CAPS capsule Take 0.4 mg by mouth 2 (two) times daily.     timolol (TIMOPTIC) 0.5 % ophthalmic solution Place 1 drop into both eyes at bedtime.  5   warfarin (COUMADIN) 3 MG tablet TAKE 3 TABLETS DAILY EXCEPT 2 TABLETS ONMONDAY AND FRIDAY OR AS DIRECTED BY CLINIC (Patient taking differently: Take 6-9 mg by mouth See admin instructions. Takes 6 mg on Monday and Friday ,then 9 mg on all other days) 90 tablet 2   No current facility-administered medications for this encounter.    BP 118/68   Pulse 61   Wt 89.7 kg (197 lb 12.8 oz)   SpO2 95%   BMI 27.98 kg/m    Wt Readings from Last 3 Encounters:  10/30/21 89.7 kg (197 lb 12.8 oz)  10/22/21 90.3 kg (199 lb)  10/14/21 90.7 kg  (199 lb 15.3 oz)   PHYSICAL EXAM: General: NAD Neck: No JVD, no thyromegaly or thyroid nodule.  Lungs: Clear to auscultation bilaterally with  normal respiratory effort. CV: Nondisplaced PMI.  Heart regular S1/S2, no S3/S4, 1/6 early SEM RUSB.  No peripheral edema.  No carotid bruit.  Unable to palpate pedal pulses.  Abdomen: Soft, nontender, no hepatosplenomegaly, no distention.  Skin: Intact without lesions or rashes.  Neurologic: Alert and oriented x 3.  Psych: Normal affect. Extremities: No clubbing or cyanosis.  HEENT: Normal.   Assessment/Plan:  1. CAD: s/p CABG and redo CABG.  Given fall in EF, I did a cardiac cath in 7/15 as documented above. Unfortunately, despite significant coronary disease, there were no good interventional targets.  No recent chest pain.  - Continue atorvastatin, Zetia, and Repatha => He has restarted taking Repatha regularly.  - He is on warfarin in setting of stable CAD so does not need ASA 81.   2. Hyperlipidemia: He is taking atorvastatin + Zetia and Repatha. Now taking Repatha regularly.  - Check lipids in a couple of months.  3. PAD: Patient denies claudication or foot ulcers. Peripheral arterial dopplers showed > 50% bilateral focal SFA stenoses in the past.  He is not a cilostazol candidate with CHF. He is followed by Dr. Trula Slade at [facility], had recent appt. No current claudication symptoms.  - Has appointment at [facility] in 12/22.  4. Carotid stenosis: S/p right CEA in 2/19.  Followed by Dr. Trula Slade at [facility]. Carotid Dopplers completed 11/21-->bilateral ICA without significant stenosis but >50% common carotid stenosis on left. - Followup at [facility] in 12/22.  5. AAA: Successful endovascular repair in 5/15.  Followed by Dr. Trula Slade at [facility].  6. Bioprosthetic AVR: Valve stable on 2/22 TEE.  7. Atrial fibrillation: Possible CNS side effects with amiodarone.  Given worsening CHF with atrial fibrillation and difficulty with rate control, I think that we need to maintain  NSR.  He is on Tikosyn. Several breakthrough episodes in the last 6 months.  He saw EP and was offered ablation but decided against it. He is in NSR.   - Avoid OTC stimulants (had AF with use of [name]).  - Continue Tikosyn, I do not think he has other anti-arrhythmic options (did not tolerate amiodarone).  QTc ok on ECG today.  - Continue warfarin.  - Continue Toprol XL 25 mg bid.  8. Chronic primarily diastolic CHF: Suspect ischemic cardiomyopathy.  EF 25-30% in 6/15 but had normalized to 55-60% by 1/16.  Down to 45-50% on 1/19 echo but was in the setting of atrial fibrillation.  TEE in 6/20 showed EF 50%. TEE in 2/22 showed EF 45-50%.  NYHA class II-III symptoms, worse in atrial fibrillation.  He is not significantly volume overloaded today and weight is down.  He tolerates AF poorly, with worsening of HF symptoms.   - Continue Lasix 40 daily. Today's BMET was stable.      - Continue ramipril 2.5 mg bid.   - Continue spironolactone 25 mg daily.  - Off Jardiance with recent E coli UTI.  Will leave off given severe episode with fall and rhabdomyolysis.  - Continue Toprol XL 25 mg bid.  9. HTN: BP generally controlled.    10. Diabetes: Jardiance stopped with recent E coli UTI.  11. OSA: Daytime sleepiness and fatigue. He says that he will not use CPAP so we will defer sleep study.   Followup in 3 months.   [name] 10/30/2021

## 2021-11-13 ENCOUNTER — Telehealth (HOSPITAL_COMMUNITY): Payer: Self-pay | Admitting: *Deleted

## 2021-11-13 NOTE — Telephone Encounter (Signed)
Pt spoke with Adline Potter Monday about finding a PCP. Pt asked if Dr.McLean recommended anyone Dr.McLean asked Nira Conn to have social work help find pt a PCP.    Routed to [employer] to assist.

## 2021-11-18 ENCOUNTER — Telehealth: Payer: Self-pay

## 2021-11-18 NOTE — Telephone Encounter (Signed)
I have tried to reach out to patient to schedule a 40 minute new patient appt with Dr. Yong Channel (per staff message between Dr. Yong Channel and Dr. Aundra Dubin.  Patients phone rung.  No Answer and no VM set up.  I will try again at a later time.

## 2021-11-21 ENCOUNTER — Other Ambulatory Visit: Payer: Self-pay

## 2021-11-21 ENCOUNTER — Telehealth: Payer: Self-pay | Admitting: Cardiology

## 2021-11-21 ENCOUNTER — Ambulatory Visit: Payer: Medicare Other

## 2021-11-21 DIAGNOSIS — Z5181 Encounter for therapeutic drug level monitoring: Secondary | ICD-10-CM

## 2021-11-21 DIAGNOSIS — I4891 Unspecified atrial fibrillation: Secondary | ICD-10-CM | POA: Diagnosis not present

## 2021-11-21 LAB — PROTIME-INR
INR: >10 (ref 0.9–1.2)
INR: 8.6 (ref 0.8–1.2)
Prothrombin Time: 107 s — ABNORMAL HIGH (ref 9.1–12.0)
Prothrombin Time: 70.8 seconds — ABNORMAL HIGH (ref 11.4–15.2)

## 2021-11-21 LAB — POCT INR: INR: 8 — AB (ref 2.0–3.0)

## 2021-11-21 NOTE — Patient Instructions (Signed)
Description   Called and instructed pt to hold Warfarin until 11/24/21 and eat greens. Recheck INR on 11/24/21. Instructed to go to ER if signs/symptoms of bleeding occur.  Normal dosage: Warfarin 3 tablets daily except for 2 tablets on Mondays and Fridays. Coumadin clinic [phone].

## 2021-11-21 NOTE — Telephone Encounter (Signed)
Spoke with Estill Bamberg who reports pt with critical PT/INR of 8.6.  Will forward to anti-coag clinic for further review and recommendation.

## 2021-11-21 NOTE — Telephone Encounter (Signed)
Nathaniel [name] with [facility] is calling to report critical INR results.

## 2021-11-24 ENCOUNTER — Ambulatory Visit: Payer: Medicare Other | Admitting: *Deleted

## 2021-11-24 ENCOUNTER — Other Ambulatory Visit: Payer: Self-pay

## 2021-11-24 DIAGNOSIS — Z5181 Encounter for therapeutic drug level monitoring: Secondary | ICD-10-CM | POA: Diagnosis not present

## 2021-11-24 DIAGNOSIS — I4891 Unspecified atrial fibrillation: Secondary | ICD-10-CM | POA: Diagnosis not present

## 2021-11-24 LAB — POCT INR: INR: 2.9 (ref 2.0–3.0)

## 2021-11-24 NOTE — Patient Instructions (Signed)
Description   Continue taking Warfarin 3 tablets daily except for 2 tablets on Mondays and Fridays. Recheck INR in 1 week. Coumadin clinic [phone].

## 2021-11-25 DIAGNOSIS — Z85828 Personal history of other malignant neoplasm of skin: Secondary | ICD-10-CM | POA: Diagnosis not present

## 2021-11-25 DIAGNOSIS — L570 Actinic keratosis: Secondary | ICD-10-CM | POA: Diagnosis not present

## 2021-12-05 ENCOUNTER — Other Ambulatory Visit: Payer: Self-pay

## 2021-12-05 ENCOUNTER — Ambulatory Visit (INDEPENDENT_AMBULATORY_CARE_PROVIDER_SITE_OTHER): Payer: Medicare Other

## 2021-12-05 DIAGNOSIS — I4891 Unspecified atrial fibrillation: Secondary | ICD-10-CM

## 2021-12-05 DIAGNOSIS — H35373 Puckering of macula, bilateral: Secondary | ICD-10-CM | POA: Diagnosis not present

## 2021-12-05 DIAGNOSIS — Z5181 Encounter for therapeutic drug level monitoring: Secondary | ICD-10-CM

## 2021-12-05 DIAGNOSIS — Z961 Presence of intraocular lens: Secondary | ICD-10-CM | POA: Diagnosis not present

## 2021-12-05 DIAGNOSIS — H401122 Primary open-angle glaucoma, left eye, moderate stage: Secondary | ICD-10-CM | POA: Diagnosis not present

## 2021-12-05 LAB — POCT INR: INR: 2 (ref 2.0–3.0)

## 2021-12-05 NOTE — Patient Instructions (Signed)
Description   Take 2.5 tablets today and then continue taking Warfarin 3 tablets daily except for 2 tablets on Mondays and Fridays. Recheck INR in 3 weeks. Coumadin clinic [phone].

## 2021-12-25 ENCOUNTER — Other Ambulatory Visit (HOSPITAL_COMMUNITY): Payer: Self-pay

## 2021-12-25 ENCOUNTER — Other Ambulatory Visit: Payer: Self-pay | Admitting: Cardiology

## 2021-12-25 MED ORDER — ATORVASTATIN CALCIUM 20 MG PO TABS: ORAL_TABLET | ORAL | 11 refills | Status: DC

## 2021-12-26 ENCOUNTER — Other Ambulatory Visit: Payer: Self-pay

## 2021-12-26 ENCOUNTER — Ambulatory Visit: Payer: Medicare Other

## 2021-12-26 DIAGNOSIS — Z5181 Encounter for therapeutic drug level monitoring: Secondary | ICD-10-CM

## 2021-12-26 DIAGNOSIS — I4891 Unspecified atrial fibrillation: Secondary | ICD-10-CM

## 2021-12-26 LAB — POCT INR: INR: 1.4 — AB (ref 2.0–3.0)

## 2021-12-26 NOTE — Patient Instructions (Signed)
Description   Take 3 tablets today and 3.5 tablets tomorrow and then continue taking Warfarin 3 tablets daily except for 2 tablets on Mondays and Fridays. Recheck INR in 10 days. Coumadin clinic [phone].

## 2022-01-09 ENCOUNTER — Ambulatory Visit (INDEPENDENT_AMBULATORY_CARE_PROVIDER_SITE_OTHER): Payer: Medicare Other | Admitting: *Deleted

## 2022-01-09 ENCOUNTER — Other Ambulatory Visit: Payer: Self-pay

## 2022-01-09 ENCOUNTER — Other Ambulatory Visit (HOSPITAL_COMMUNITY): Payer: Self-pay | Admitting: *Deleted

## 2022-01-09 DIAGNOSIS — I4891 Unspecified atrial fibrillation: Secondary | ICD-10-CM | POA: Diagnosis not present

## 2022-01-09 DIAGNOSIS — Z5181 Encounter for therapeutic drug level monitoring: Secondary | ICD-10-CM | POA: Diagnosis not present

## 2022-01-09 LAB — POCT INR: INR: 1.5 — AB (ref 2.0–3.0)

## 2022-01-09 MED ORDER — TAMSULOSIN HCL 0.4 MG PO CAPS: 0.4000 mg | ORAL_CAPSULE | Freq: Two times a day (BID) | ORAL | 0 refills | Status: DC

## 2022-01-09 NOTE — Patient Instructions (Signed)
Description    Take 3 tablets of warfarin today and then START taking warfarin 3 tablets daily except for 2 tablets on Fridays. Recheck INR in 1.5 weeks. Coumadin Clinic [phone].

## 2022-01-19 DIAGNOSIS — H35373 Puckering of macula, bilateral: Secondary | ICD-10-CM | POA: Diagnosis not present

## 2022-01-19 DIAGNOSIS — Z961 Presence of intraocular lens: Secondary | ICD-10-CM | POA: Diagnosis not present

## 2022-01-19 DIAGNOSIS — H401122 Primary open-angle glaucoma, left eye, moderate stage: Secondary | ICD-10-CM | POA: Diagnosis not present

## 2022-01-21 ENCOUNTER — Other Ambulatory Visit: Payer: Self-pay

## 2022-01-21 ENCOUNTER — Ambulatory Visit: Payer: Medicare Other | Admitting: *Deleted

## 2022-01-21 DIAGNOSIS — Z5181 Encounter for therapeutic drug level monitoring: Secondary | ICD-10-CM | POA: Diagnosis not present

## 2022-01-21 DIAGNOSIS — I4891 Unspecified atrial fibrillation: Secondary | ICD-10-CM | POA: Diagnosis not present

## 2022-01-21 LAB — POCT INR: INR: 1.5 — AB (ref 2.0–3.0)

## 2022-01-21 NOTE — Progress Notes (Signed)
PCP:  Pcp, No Primary Cardiologist: None Electrophysiologist: Will Meredith Leeds, MD   [name] is a 88 y.o. male seen today for Will Meredith Leeds, MD for routine electrophysiology followup.  Since last being seen in our clinic the patient reports doing very well.  he denies chest pain, palpitations, dyspnea, PND, orthopnea, nausea, vomiting, dizziness, syncope, edema, weight gain, or early satiety.  Past Medical History:  Diagnosis Date   AAA (abdominal aortic aneurysm)    Atrial fibrillation (HCC)    Atrial fibrillation (HCC)    CAD (coronary artery disease)    Cataract    CHF (congestive heart failure) (HCC)    Chronic kidney disease    Colon polyps    adenomatous   Diabetes mellitus    Diverticulosis    Dysrhythmia    A-Fib   GERD (gastroesophageal reflux disease)    Gout    Hernia, abdominal    History of aortic valve replacement with bioprosthetic valve    2007   Hx: UTI (urinary tract infection)    Hyperlipemia    Hypertension    Internal hemorrhoids    Peripheral vascular disease (HCC)    Shortness of breath    with exertion   Past Surgical History:  Procedure Laterality Date   ABDOMINAL AORTIC ENDOVASCULAR STENT GRAFT N/A 04/13/2014   Procedure: ABDOMINAL AORTIC ENDOVASCULAR STENT GRAFT;  Surgeon: Serafina Mitchell, MD;  Location: [place];  Service: Vascular;  Laterality: N/A;   ANGIOPLASTY     Unsure if he had stents put in    [place] N/A 06/06/2014   Procedure: CARDIOVERSION;  Surgeon: Larey Dresser, MD;  Location: [place];  Service: Cardiovascular;  Laterality: N/A;   CARDIOVERSION N/A 08/21/2014   Procedure: CARDIOVERSION;  Surgeon: Larey Dresser, MD;  Location: [place];  Service: Cardiovascular;  Laterality: N/A;   CARDIOVERSION N/A 12/22/2017   Procedure: CARDIOVERSION;  Surgeon: Larey Dresser, MD;  Location: [place];  Service: Cardiovascular;  Laterality: N/A;   CARDIOVERSION N/A  05/25/2019   Procedure: CARDIOVERSION;  Surgeon: Larey Dresser, MD;  Location: [place];  Service: Cardiovascular;  Laterality: N/A;   CARDIOVERSION N/A 06/14/2019   Procedure: CARDIOVERSION;  Surgeon: Jerline Pain, MD;  Location: [facility] ENDOSCOPY;  Service: Cardiovascular;  Laterality: N/A;   CARDIOVERSION N/A 12/12/2020   Procedure: CARDIOVERSION;  Surgeon: Larey Dresser, MD;  Location: [place];  Service: Cardiovascular;  Laterality: N/A;   CARDIOVERSION N/A 01/29/2021   Procedure: CARDIOVERSION;  Surgeon: Larey Dresser, MD;  Location: [place];  Service: Cardiovascular;  Laterality: N/A;   CORONARY ARTERY BYPASS GRAFT     ENDARTERECTOMY Right 02/02/2018   Procedure: ENDARTERECTOMY CAROTID RIGHT;  Surgeon: Serafina Mitchell, MD;  Location: [facility] OR;  Service: Vascular;  Laterality: Right;   EYE SURGERY Bilateral    cataract surgery   HERNIA REPAIR     2 operations   LEFT HEART CATHETERIZATION WITH CORONARY/GRAFT ANGIOGRAM N/A 07/10/2014   Procedure: LEFT HEART CATHETERIZATION WITH Beatrix Fetters;  Surgeon: Larey Dresser, MD;  Location: [facility] CATH LAB;  Service: Cardiovascular;  Laterality: N/A;   TEE WITHOUT CARDIOVERSION N/A 05/25/2019   Procedure: TRANSESOPHAGEAL ECHOCARDIOGRAM (TEE);  Surgeon: Larey Dresser, MD;  Location[facility];  Service: Cardiovascular;  Laterality: N/A;   TEE WITHOUT CARDIOVERSION N/A 01/29/2021   Procedure: TRANSESOPHAGEAL ECHOCARDIOGRAM (TEE);  Surgeon: Larey Dresser, MD;  Location: [facility] ENDOSCOPY;  Service: Cardiovascular;  Laterality: N/A;   TONSILLECTOMY     VIDEO BRONCHOSCOPY Bilateral 12/25/2015   Procedure: VIDEO BRONCHOSCOPY WITHOUT FLUORO;  Surgeon: Javier Glazier, MD;  Location: [place];  Service: Cardiopulmonary;  Laterality: Bilateral;    Current Outpatient Medications  Medication Sig Dispense Refill   allopurinol (ZYLOPRIM) 300 MG tablet Take 300 mg by mouth daily.   2   atorvastatin (LIPITOR) 20 MG tablet Take 1  tab daily every other day ALTERNATING with 1.5 tabs daily every other day 45 tablet 11   Coenzyme Q10 (CO Q10) 200 MG CAPS Take 200 mg by mouth daily.     dofetilide (TIKOSYN) 250 MCG capsule TAKE ONE CAPSULE TWICE A DAY 60 capsule 11   Evolocumab (REPATHA SURECLICK) 315 MG/ML SOAJ Inject 1 pen into the skin every 14 (fourteen) days. 2 mL 11   ezetimibe (ZETIA) 10 MG tablet Take 1 tablet (10 mg total) by mouth at bedtime. 30 tablet 3   furosemide (LASIX) 40 MG tablet Take 1 tablet (40 mg total) by mouth daily. 30 tablet    latanoprost (XALATAN) 0.005 % ophthalmic solution Place 1 drop into both eyes daily.     Metoprolol Succinate 25 MG CS24 Take 25 mg by mouth 2 (two) times daily.     Misc Natural Products (GLUCOSAMINE CHONDROITIN TRIPLE) TABS Take 1 tablet by mouth daily.     ramipril (ALTACE) 2.5 MG capsule Take 1 capsule (2.5 mg total) by mouth 2 (two) times daily. 90 capsule 3   spironolactone (ALDACTONE) 25 MG tablet Take 0.5 tablets (12.5 mg total) by mouth daily. 45 tablet 3   tamsulosin (FLOMAX) 0.4 MG CAPS capsule Take 1 capsule (0.4 mg total) by mouth 2 (two) times daily. 60 capsule 0   timolol (TIMOPTIC) 0.5 % ophthalmic solution Place 1 drop into both eyes at bedtime.  5   warfarin (COUMADIN) 3 MG tablet TAKE 3 TABLETS DAILY EXCEPT 2 TABLETS ONMONDAY AND FRIDAY OR AS DIRECTED BY CLINIC (Patient taking differently: Take 6-9 mg by mouth See admin instructions. Takes 6 mg on Monday and Friday ,then 9 mg on all other days) 90 tablet 2   No current facility-administered medications for this visit.    Allergies  Allergen Reactions   Grass Extracts [Gramineae Pollens] Other (See Comments)    Sneezing and runny nose   Milk-Related Compounds Other (See Comments)    Just Yogurt.  Makes him very lethargic.   Amiodarone Anxiety    depression    Social History   Socioeconomic History   Marital status: Widowed    Spouse name: Not on file   Number of children: Not on file   Years of  education: Not on file   Highest education level: Not on file  Occupational History   Occupation: [employer]   Tobacco Use   Smoking status: Former    Packs/day: 0.25    Years: 20.00    Pack years: 5.00    Types: Cigarettes    Quit date: 03/23/1992    Years since quitting: 29.8   Smokeless tobacco: Never  Vaping Use   Vaping Use: Never used  Substance and Sexual Activity   Alcohol use: Yes    Alcohol/week: 10.0 standard drinks    Types: 10 Glasses of wine per week    Comment: 1-2 drinks daily    Drug use: No   Sexual activity: Not Currently  Other Topics Concern   Not on file  Social History Narrative   1  caffeine drink daily    Social Determinants of Health   Financial Resource Strain: Low Risk    Difficulty of Paying Living Expenses: Not very hard  Food Insecurity: No Food Insecurity   Worried About Charity fundraiser in the Last Year: Never true   Ran Out of Food in the Last Year: Never true  Transportation Needs: No Transportation Needs   Lack of Transportation (Medical): No   Lack of Transportation (Non-Medical): No  Physical Activity: Not on file  Stress: Not on file  Social Connections: Not on file  Intimate Partner Violence: Not on file     Review of Systems: All other systems reviewed and are otherwise negative except as noted above.  Physical Exam: Vitals:   01/28/22 1037  BP: 124/70  Pulse: (!) 102  SpO2: 95%  Weight: 196 lb 3.2 oz (89 kg)  Height: 5' 10.5" (1.791 m)    GEN- The patient is well appearing, alert and oriented x 3 today.   HEENT: normocephalic, atraumatic; sclera clear, conjunctiva pink; hearing intact; oropharynx clear; neck supple, no JVP Lymph- no cervical lymphadenopathy Lungs- Clear to ausculation bilaterally, normal work of breathing.  No wheezes, rales, rhonchi Heart- Irregularly irregular rate and rhythm, no murmurs, rubs or gallops, PMI not laterally displaced GI- soft, non-tender, non-distended, bowel sounds present,  no hepatosplenomegaly Extremities- no clubbing, cyanosis, or edema; DP/PT/radial pulses 2+ bilaterally MS- no significant deformity or atrophy Skin- warm and dry, no rash or lesion Psych- euthymic mood, full affect Neuro- strength and sensation are intact  EKG is ordered. Personal review of EKG from today shows AF with variable rate, average 102 bpm  Additional studies reviewed include: Previous EP office notes.   Assessment and Plan:  1. Persistent atrial fibrillation EKG today shows AF with stable QTc on tikosyn Continue tikosyn 250 mcg BID Countinue coumadin for CHA2DS2VASC is at least 6. Labs today. He doesn't believe me when I tell him he's in fib today, because he feels so well   2. CAD s/p CABG Denies s/s ischemia   3. PAD  Followed by vascular   4. Chronic systolic CHF Follows with Dr. Aundra Dubin    5. Urinary Retention Following with Urology.  Follow up with Dr. Curt Bears in 6 months. Repeat EKG at HF visit next week. I expect he goes in and out.   Shirley Friar, PA-C  01/21/22 10:50 AM

## 2022-01-21 NOTE — Patient Instructions (Signed)
Description   Take 4 tablets of warfarin today and then START taking the dose you should be taking, which is, warfarin 3 tablets daily except for 2 tablets on Fridays. Recheck INR in 1 week. [facility] [phone].

## 2022-01-28 ENCOUNTER — Ambulatory Visit (INDEPENDENT_AMBULATORY_CARE_PROVIDER_SITE_OTHER): Payer: Medicare Other | Admitting: *Deleted

## 2022-01-28 ENCOUNTER — Encounter: Payer: Self-pay | Admitting: Student

## 2022-01-28 ENCOUNTER — Other Ambulatory Visit: Payer: Self-pay

## 2022-01-28 ENCOUNTER — Ambulatory Visit (INDEPENDENT_AMBULATORY_CARE_PROVIDER_SITE_OTHER): Payer: Medicare Other | Admitting: Student

## 2022-01-28 VITALS — BP 124/70 | HR 102 | Ht 70.5 in | Wt 196.2 lb

## 2022-01-28 DIAGNOSIS — R339 Retention of urine, unspecified: Secondary | ICD-10-CM

## 2022-01-28 DIAGNOSIS — I5032 Chronic diastolic (congestive) heart failure: Secondary | ICD-10-CM | POA: Diagnosis not present

## 2022-01-28 DIAGNOSIS — I4891 Unspecified atrial fibrillation: Secondary | ICD-10-CM

## 2022-01-28 DIAGNOSIS — Z5181 Encounter for therapeutic drug level monitoring: Secondary | ICD-10-CM

## 2022-01-28 LAB — POCT INR: INR: 2.6 (ref 2.0–3.0)

## 2022-01-28 NOTE — Patient Instructions (Signed)
Description   Continue taking warfarin 3 tablets daily except for 2 tablets on Fridays. Recheck INR in 3 weeks. [facility] [phone].

## 2022-01-28 NOTE — Patient Instructions (Signed)
Medication Instructions:  Your physician recommends that you continue on your current medications as directed. Please refer to the Current Medication list given to you today.  *If you need a refill on your cardiac medications before your next appointment, please call your pharmacy*   Lab Work: None If you have labs (blood work) drawn today and your tests are completely normal, you will receive your results only by: [place] (if you have MyChart) OR A paper copy in the mail If you have any lab test that is abnormal or we need to change your treatment, we will call you to review the results.   Follow-Up: At [facility], you and your health needs are our priority.  As part of our continuing mission to provide you with exceptional heart care, we have created designated Provider Care Teams.  These Care Teams include your primary Cardiologist (physician) and Advanced Practice Providers (APPs -  Physician Assistants and Nurse Practitioners) who all work together to provide you with the care you need, when you need it.  We recommend signing up for the patient portal called "MyChart".  Sign up information is provided on this After Visit Summary.  MyChart is used to connect with patients for Virtual Visits (Telemedicine).  Patients are able to view lab/test results, encounter notes, upcoming appointments, etc.  Non-urgent messages can be sent to your provider as well.   To learn more about what you can do with MyChart, go to [email or web address].    Your next appointment:   6 month(s)  The format for your next appointment:   In Person  Provider:   You may see Will Meredith Leeds, MD or one of the following Advanced Practice Providers on your designated Care Team:   Tommye Standard, [place] [name] "[facility]" [place], [place]

## 2022-02-04 ENCOUNTER — Encounter (HOSPITAL_COMMUNITY): Payer: Medicare Other | Admitting: Cardiology

## 2022-02-10 ENCOUNTER — Other Ambulatory Visit (HOSPITAL_COMMUNITY): Payer: Self-pay

## 2022-02-10 ENCOUNTER — Encounter (HOSPITAL_COMMUNITY): Payer: Self-pay | Admitting: Cardiology

## 2022-02-10 ENCOUNTER — Ambulatory Visit (HOSPITAL_COMMUNITY)
Admission: RE | Admit: 2022-02-10 | Discharge: 2022-02-10 | Disposition: A | Discharge status: Home / Self Care | Payer: Medicare Other | Source: Ambulatory Visit | Attending: Cardiology | Admitting: Cardiology

## 2022-02-10 ENCOUNTER — Other Ambulatory Visit: Payer: Self-pay

## 2022-02-10 VITALS — BP 138/80 | HR 97 | Wt 200.6 lb

## 2022-02-10 DIAGNOSIS — I5022 Chronic systolic (congestive) heart failure: Secondary | ICD-10-CM | POA: Diagnosis not present

## 2022-02-10 DIAGNOSIS — I4819 Other persistent atrial fibrillation: Secondary | ICD-10-CM | POA: Diagnosis not present

## 2022-02-10 DIAGNOSIS — Z953 Presence of xenogenic heart valve: Secondary | ICD-10-CM | POA: Diagnosis not present

## 2022-02-10 DIAGNOSIS — I2510 Atherosclerotic heart disease of native coronary artery without angina pectoris: Secondary | ICD-10-CM | POA: Diagnosis not present

## 2022-02-10 DIAGNOSIS — I480 Paroxysmal atrial fibrillation: Secondary | ICD-10-CM | POA: Insufficient documentation

## 2022-02-10 DIAGNOSIS — E1151 Type 2 diabetes mellitus with diabetic peripheral angiopathy without gangrene: Secondary | ICD-10-CM | POA: Insufficient documentation

## 2022-02-10 DIAGNOSIS — I110 Hypertensive heart disease with heart failure: Secondary | ICD-10-CM | POA: Diagnosis not present

## 2022-02-10 DIAGNOSIS — I4891 Unspecified atrial fibrillation: Secondary | ICD-10-CM

## 2022-02-10 DIAGNOSIS — E785 Hyperlipidemia, unspecified: Secondary | ICD-10-CM | POA: Insufficient documentation

## 2022-02-10 DIAGNOSIS — I714 Abdominal aortic aneurysm, without rupture, unspecified: Secondary | ICD-10-CM | POA: Insufficient documentation

## 2022-02-10 DIAGNOSIS — Z7901 Long term (current) use of anticoagulants: Secondary | ICD-10-CM | POA: Insufficient documentation

## 2022-02-10 DIAGNOSIS — G4733 Obstructive sleep apnea (adult) (pediatric): Secondary | ICD-10-CM | POA: Diagnosis not present

## 2022-02-10 DIAGNOSIS — Z79899 Other long term (current) drug therapy: Secondary | ICD-10-CM | POA: Diagnosis not present

## 2022-02-10 DIAGNOSIS — Z951 Presence of aortocoronary bypass graft: Secondary | ICD-10-CM | POA: Diagnosis not present

## 2022-02-10 DIAGNOSIS — I5042 Chronic combined systolic (congestive) and diastolic (congestive) heart failure: Secondary | ICD-10-CM | POA: Insufficient documentation

## 2022-02-10 LAB — BASIC METABOLIC PANEL
Anion gap: 8 (ref 5–15)
BUN: 20 mg/dL (ref 8–23)
CO2: 28 mmol/L (ref 22–32)
Calcium: 9.3 mg/dL (ref 8.9–10.3)
Chloride: 101 mmol/L (ref 98–111)
Creatinine, Ser: 1.16 mg/dL (ref 0.61–1.24)
GFR, Estimated: >60 mL/min (ref >60)
Glucose, Bld: 111 mg/dL — ABNORMAL HIGH (ref 70–99)
Potassium: 4.3 mmol/L (ref 3.5–5.1)
Sodium: 137 mmol/L (ref 135–145)

## 2022-02-10 LAB — CBC
HCT: 42.6 % (ref 39.0–52.0)
Hemoglobin: 14.3 g/dL (ref 13.0–17.0)
MCH: 30.5 pg (ref 26.0–34.0)
MCHC: 33.6 g/dL (ref 30.0–36.0)
MCV: 90.8 fL (ref 80.0–100.0)
Platelets: 162 10*3/uL (ref 150–400)
RBC: 4.69 MIL/uL (ref 4.22–5.81)
RDW: 13.1 % (ref 11.5–15.5)
WBC: 6.5 10*3/uL (ref 4.0–10.5)
nRBC: 0 % (ref 0.0–0.2)

## 2022-02-10 LAB — PROTIME-INR
INR: 2.3 — ABNORMAL HIGH (ref 0.8–1.2)
Prothrombin Time: 24.9 seconds — ABNORMAL HIGH (ref 11.4–15.2)

## 2022-02-10 LAB — BRAIN NATRIURETIC PEPTIDE: B Natriuretic Peptide: 209.8 pg/mL — ABNORMAL HIGH (ref 0.0–100.0)

## 2022-02-10 NOTE — Patient Instructions (Addendum)
Thank you for your visit today.  CHECK YOUR DOSE OF LASIX WHEN YOU GET HOME. IF YOU ARE TAKING 40 MG Twice daily PLEASE INCREASE TO 60 MG Twice daily. PLEASE CALL THE OFFICE TO LET us KNOW   Labs done today, your results will be available in MyChart, we will contact you for abnormal readings.   You are scheduled for a TEE/Cardioversion/TEE Cardioversion on Monday March 6th  with Dr. Dr. Aundra Dubin.  Please arrive at the [facility] (Main Entrance A) at [facility]: [address] [place], [place] [address] at 11.30 am. (1 hour prior to procedure unless lab work is needed; if lab work is needed arrive 1.5 hours ahead)  DIET: Nothing to eat or drink after midnight except a sip of water with medications (see medication instructions below)  FYI: For your safety, and to allow [place] to monitor your vital signs accurately during the surgery/procedure we request that   if you have artificial nails, gel coating, SNS etc. Please have those removed prior to your surgery/procedure. Not having the nail coverings /polish removed may result in cancellation or delay of your surgery/procedure.   Medication Instructions: Hold Lasix nad Spironolactone morning of the procedure.  Continue your anticoagulant: Warafrin You will need to continue your anticoagulant after your procedure until you  are told by your  Provider that it is safe to stop   Labs: If patient is on Coumadin, patient needs pt/INR, CBC, BMET within 3 days (No pt/INR needed for patients taking Xarelto, Eliquis, Pradaxa) For patients receiving anesthesia for TEE and all Cardioversion patients: BMET, CBC within 1 week  You must have a responsible person to drive you home and stay in the waiting area during your procedure. Failure to do so could result in cancellation.  Bring your insurance cards.  *Special Note: Every effort is made to have your procedure done on time. Occasionally there are emergencies that occur at the hospital that may  cause delays. Please be patient if a delay does occur.   You need to start taking your [place] physician recommends that you schedule a follow-up appointment in: 3weeks.  If you have any questions or concerns before your next appointment please send [place] a message through [facility] or call our office at [phone].    TO LEAVE A MESSAGE FOR THE NURSE SELECT OPTION 2, PLEASE LEAVE A MESSAGE INCLUDING: YOUR NAME DATE OF BIRTH CALL BACK NUMBER REASON FOR CALL**this is important as we prioritize the call backs  YOU WILL RECEIVE A CALL BACK THE SAME DAY AS LONG AS YOU CALL BEFORE 4:00 PM  At the [facility], you and your health needs are our priority. As part of our continuing mission to provide you with exceptional heart care, we have created designated Provider Care Teams. These Care Teams include your primary Cardiologist (physician) and Advanced Practice Providers (APPs- Physician Assistants and Nurse Practitioners) who all work together to provide you with the care you need, when you need it.   You may see any of the following providers on your designated Care Team at your next follow up: Dr Glori Bickers Dr Haynes Kerns, NP Lyda Jester, [place] [facility], [place] Audry Riles, PharmD   Please be sure to bring in all your medications bottles to every appointment.

## 2022-02-11 NOTE — Progress Notes (Signed)
Patient ID: Kylon Philbrook., male   DOB: [birth year], 88 y.o.   MRN: [id number] PCP: Dr. Elyse Hsu Cardiology: Dr. Aundra Dubin  88 y.o. with history of extensive vascular disease including CAD s/p CABG and redo CABG, AAA s/p repair, PAD, and carotid stenosis as well as prior AVR presents for followup of CAD and atrial fibrillation.   He has had documentation of significant peripheral arterial disease.  He gets bilateral calf soreness after walking for about 15 minutes.  This resolves with rest, no rest pain.  No pedal ulcers, though when he gets cuts on his feet, they heal slowly.  Peripheral arterial dopplers done in 10/14 showed > 50% focal SFA stenoses bilaterally.  He has carotid artery disease.  No stroke-like symptoms.  He had a endovascular AAA repair in 5/15.    After his AAA repair, he was noted to be in atrial fibrillation.  He had had a prior episode of atrial fibrillation after his cardiac surgery in 2007.  Atrial fibrillation was rate-controlled.  He was started on warfarin and Toprol XL.  He developed exertional dyspnea and fatigue in atrial fibrillation. I cardioverted him back to NSR on 06/06/14.  I also had him get an echocardiogram in 6/15.  This showed EF worsened to 25-30% with stable bioprosthetic aortic valve, mildly dilated RV with normal systolic function.  Given the fall in EF, I took him for [facility] in 7/15.  This showed stable anatomy.  The LIMA-LAD was patent, the sequential SVG-OM/ramus/D was patent only to ramus but this is the same as the prior cath, and the SVG-PDA was patent with a long 50-60% mid-graft stenosis.  No intervention.  Of note, at cath he was noted to be back in atrial fibrillation.  I then started him on amiodarone.  I took Mr Han[place]alt for [place] again on 08/21/14.  This was successful.  He has been off amiodarone due to side effects (hallucinations).  Echo in 1/16 showed recovery of EF to 55-60%.    He was admitted in 1/19 with bronchitis/wheezing/dyspnea and was found to be in  atrial fibrillation with RVR.  He tested positive for metapneumovirus.  Echo showed EF 45-50% with normal bioprosthetic aortic valve. He was diuresed for volume overload while in the hospital. He had DCCV to NSR.    Carotid dopplers in 1/19 showed 80-99% RICA stenosis, he had right CEA in 1/01 with no complications.   At appointment in early 6/20, Mr Etchison was noted to be back in atrial fibrillation with RVR and mild CHF.  He was started on Toprol XL and Lasix was increased.  He had TEE-guided DCCV to NSR in 6/20.  TEE showed EF 50%, mild diffuse hypokinesis, mildly decreased RV systolic function, normally functioning bioprosthetic aortic valve.   In 2021, he was admitted for Tikosyn initiation.  In 12/21, he went into atrial fibrillation again and was cardioverted back to NSR.   Echo in 1/22 showed EF 45-50%, diffuse hypokinesis, mildly decreased RV systolic function, moderate biatrial enlargement, bioprosthetic valve with mean gradient 8 mmHg.    In 2/22, he went into atrial fibrillation and had TEE-guided DCCV back to NSR.  TEE showed EF 45-50%.    In 3/22, he was back in the ER with atrial fibrillation triggered by [name] most likely.  He had a cardioversion back to NSR.   In 10/22, patient developed urinary retention and an E coli UTI.  In the setting of this, he had a fall at home and was unable to  get up due to weakness for hours.  He developed rhabdomyolysis.  This resolved with minimal renal damage with IV fluid hydration and he was discharged to rehab.    He presents today for followup of CHF and atrial fibrillation.  He was noted to be in atrial fibrillation last week at his EP followup.  He remains in atrial fibrillation today with HR 90s.  He is now living at [facility] in independent living. Weight up 3 lbs. He has developed increased lower extremity edema over the last few weeks. He is short of breath walking a long distance.  No orthopnea/PND.  No chest pain. He does not feel  palpitations.    ECG: Atrial fibrillation, inferolateral TWIs,  QTc 464 (personally reviewed)  Labs (5/15): K 4.3, creatinine 0.89 Labs (6/15): K 4.3, creatinine 1.4, BNP 112 Labs (7/15): K 4.4, creatinine 1.2, HCT 40.2 Labs (8/15): K 4, creatinine 1.2, BNP 113, LFTs and TSH normal Labs (10/15): K 4.4, creatinine 1.4 Labs (5/16): K 4.7, creatinine 1.18, HDL 52, LDL 98 Labs (6/16): LDL 118, LFTs normal Labs (1/17): HCT 40.2, K 3.7, creatinine 1.04 Labs (1/19): K 4.9, creatinine 1.19 Labs (2/19): K 4.2, creatinine 0.89 Labs (3/19): LDL 86, HDL 45, TGs 245 Labs (5/19): LDL 110, HDL 39 Labs (6/19): K 4.1, creatinine 1.07 Labs (11/19): LDL 14, HDL 56 Labs (6/20): K 4.1, creatinine 1.0 Labs (7/20): K 4.3, creatinine 1.07 Labs (9/21): LDL 129, hgb 14.8, K 4.1, creatinine 1.02 Labs (12/21): LDL 20, HDL 46, K 4.4, creatinine 1.23 Labs (1/22): LDL 45, HDL 49, K 4.3, creatinine 1.1, BNP 184 Labs (3/22): K 4.4, creatinine 1.10, LDL 15, HDL 49 Labs (6/22): K 4.3, creatinine 1.11, LDL 121 Labs (7/22): K 4.3, creatinine 1.23, hgb 14.5 Labs (11/22): K 4.5, creatinine 1.14  PMH: 1. PAD: Peripheral arterial dopplers in 2012 showed > 75% bilateral SFA stenosis. Peripheral arterial dopplers in 10/14 showed > 50% focal bilateral SFA stenoses.  2. AAA: [place] 4/15 with 4.4 cm AAA but concern for penetrating ulcers. CTA abdomen showed 4.4 cm AAA with penetrating ulcers and possible pseudoaneurysms. Now s/p endovascular repair of AAA in 9/89 with no complications.  3. Carotid stenosis: Carotid dopplers (4/15) with 60-79% bilateral ICA stenosis. Carotid dopplers (11/15) with 60-79% bilateral ICA stenosis.  Carotid dopplers 6/16 with 60-79% bilateral ICA stenosis. - Carotid dopplers (1/18): 60-79% RICA stenosis.  - Carotid dopplers (1/19): 80-99% RICA stenosis => Right CEA in 2/19.  - Carotid dopplers (11/19): 40-59% bilateral stenosis.  - Carotid dopplers (1/21): 1-39% BICA stenosis.  4. CAD: CABG 1989 with  LIMA-LAD, SVG-D, seq SVG-ramus and OM, sequential SVG-PDA/PLV. SVG-PDA/PLV found to be occluded on cath prior to AVR, so patient had SVG-PDA with AVR in 2007. LHC (7/15) with LIMA-LAD patent with 40-50% stenosis in LAD after touchdown, sequential SVG-ramus/OM/diagonal with only the ramus branch still intact (known from prior cath), SVG-PDA from original surgery TO, SVG-PDA from redo surgery with long 50-60% mid-graft stenosis.  No target for intervention.  5. Severe aortic stenosis: Bioprosthetic AVR in 2007.  6. Atrial fibrillation: Paroxysmal. Initially noted after cardiac surgery, then again after AAA repair.  DCCV to NSR 06/06/14.  DCCV to NSR again 08/21/14.  - DCCV to NSR in 1/19.  - DCCV to NSR in 6/20.  - Now on Tikosyn.  - DCCV to NSR 12/21.  - DCCV to NSR 2/22 - DCCV to NSR 3/22 7. Type II diabetes  8. Gout  9. GERD  10. Hyperlipidemia  11. HTN 12.  GERD  13. Chronic systolic CHF: Echo (1/49) with EF worsened to 25-30% with grade II diastolic dysfunction, stable bioprosthetic aortic valve, mildly dilated RV with normal systolic function.  Echo (1/16) with EF 55-60%, basal inferior hypokinesis, mild LVH, bioprosthetic aortic valve with mean gradient 13 mmHg, mild MR, severe LAE.  - Echo (1/19): EF 45-50%, mild MR, normal bioprosthetic aortic valve.  - TEE (6/20): EF 50% with mild diffuse hypokinesis, mildly decreased RV systolic function with mild RV enlargement, bioprosthetic aortic valve looked ok, mild MR. - Echo (1/22):  EF 45-50%, diffuse hypokinesis, mildly decreased RV systolic function, moderate biatrial enlargement, bioprosthetic valve with mean gradient 8 mmHg.   - TEE (2/22): EF 45-50%, RV normal, moderate biatrial enlargement, bioprosthetic AoV with mean gradient 6 mmHg, mild MR.  14. BPH  15. Lung nodules: Followed by pulmonary.  25. Fall with rhabdomyolysis (10/22)   SH: Widower, 2 children, raised his 3 grand-daughters, lives in [place], [place].  Occasional ETOH, no  smoking.   FH: Father died from ruptured AAA.   ROS: All systems reviewed and negative except as per HPI.   Current Outpatient Medications  Medication Sig Dispense Refill   allopurinol (ZYLOPRIM) 300 MG tablet Take 300 mg by mouth daily.   2   atorvastatin (LIPITOR) 20 MG tablet Take 1 tab daily every other day ALTERNATING with 1.5 tabs daily every other day 45 tablet 11   Coenzyme Q10 (CO Q10) 200 MG CAPS Take 200 mg by mouth daily.     dofetilide (TIKOSYN) 250 MCG capsule TAKE ONE CAPSULE TWICE A DAY 60 capsule 11   doxazosin (CARDURA) 1 MG tablet 1 Tablet(s) By Mouth Every Evening     ezetimibe (ZETIA) 10 MG tablet Take 1 tablet (10 mg total) by mouth at bedtime. 30 tablet 3   furosemide (LASIX) 40 MG tablet Take 1 tablet (40 mg total) by mouth daily. 30 tablet    latanoprost (XALATAN) 0.005 % ophthalmic solution Place 1 drop into both eyes daily.     Metoprolol Succinate 25 MG CS24 Take 25 mg by mouth 2 (two) times daily.     Misc Natural Products (GLUCOSAMINE CHONDROITIN TRIPLE) TABS Take 1 tablet by mouth daily.     ramipril (ALTACE) 2.5 MG capsule Take 1 capsule (2.5 mg total) by mouth 2 (two) times daily. 90 capsule 3   spironolactone (ALDACTONE) 25 MG tablet Take 0.5 tablets (12.5 mg total) by mouth daily. (Patient taking differently: Take 25 mg by mouth daily.) 45 tablet 3   tamsulosin (FLOMAX) 0.4 MG CAPS capsule Take 1 capsule (0.4 mg total) by mouth 2 (two) times daily. 60 capsule 0   timolol (TIMOPTIC) 0.5 % ophthalmic solution Place 1 drop into both eyes at bedtime.  5   warfarin (COUMADIN) 3 MG tablet TAKE 3 TABLETS DAILY EXCEPT 2 TABLETS ONMONDAY AND FRIDAY OR AS DIRECTED BY CLINIC 90 tablet 2   Evolocumab (REPATHA SURECLICK) 702 MG/ML SOAJ Inject 1 pen into the skin every 14 (fourteen) days. (Patient not taking: Reported on 02/10/2022) 2 mL 11   No current facility-administered medications for this encounter.    BP 138/80    Pulse 97    Wt 91 kg (200 lb 9.6 oz)    SpO2  96%    BMI 28.38 kg/m    Wt Readings from Last 3 Encounters:  02/10/22 91 kg (200 lb 9.6 oz)  01/28/22 89 kg (196 lb 3.2 oz)  10/30/21 89.7 kg (197 lb 12.8 oz)   PHYSICAL  EXAM: General: NAD Neck: JVP 8-9 cm, no thyromegaly or thyroid nodule.  Lungs: Clear to auscultation bilaterally with normal respiratory effort. CV: Nondisplaced PMI.  Heart irregular S1/S2, no S3/S4, no murmur.  1+ edema to knees.  No carotid bruit.  Normal pedal pulses.  Abdomen: Soft, nontender, no hepatosplenomegaly, no distention.  Skin: Intact without lesions or rashes.  Neurologic: Alert and oriented x 3.  Psych: Normal affect. Extremities: No clubbing or cyanosis.  HEENT: Normal.   Assessment/Plan:  1. CAD: s/p CABG and redo CABG.  Given fall in EF, I did a cardiac cath in 7/15 as documented above. Unfortunately, despite significant coronary disease, there were no good interventional targets.  No recent chest pain.  - Continue atorvastatin, Zetia, and Repatha => he needs to restart Repatha, has not been taking regularly.   - He is on warfarin in setting of stable CAD so does not need ASA 81.   2. Hyperlipidemia: He is taking atorvastatin + Zetia and Repatha. Not taking Repatha regularly.  - He will restart regular Repatha, recheck lipids in 2 months.  3. PAD: Patient denies claudication or foot ulcers. Peripheral arterial dopplers showed > 50% bilateral focal SFA stenoses in the past.  He is not a cilostazol candidate with CHF. He is followed by Dr. Trula Slade at [facility]. No current claudication symptoms.  - Needs followup at [facility].  4. Carotid stenosis: S/p right CEA in 2/19.  Followed by Dr. Trula Slade at [facility]. Carotid Dopplers completed 11/21-->bilateral ICA without significant stenosis but >50% common carotid stenosis on left. - Needs followup at [facility].  5. AAA: Successful endovascular repair in 5/15.  Followed by Dr. Trula Slade at [facility].  6. Bioprosthetic AVR: Valve stable on 2/22 TEE.  7. Atrial fibrillation: Possible CNS  side effects with amiodarone.  Given worsening CHF with atrial fibrillation and difficulty with rate control, I think that we need to maintain NSR.  He is on Tikosyn. He saw EP and was offered ablation but decided against it. He has been back in atrial fibrillation, likely for at least a couple of weeks. I suspect AF is making CHF/volume overload worse.  - Continue Tikosyn, I do not think he has other anti-arrhythmic options (did not tolerate amiodarone).  QTc ok on ECG today.  - Continue warfarin.  - Continue Toprol XL 25 mg bid.  - He needs cardioversion back to NSR.  INR was 1.5 on 2/8, 2.6 on 2/15.  Given subtherapeutic INR relatively recently, I will arrange for TEE-guided DCCV to allow earlier DCCV. We discussed risks/benefits and he agrees to procedure. Check INR on day of DCCV.  8. Chronic primarily diastolic CHF: Suspect ischemic cardiomyopathy.  EF 25-30% in 6/15 but had normalized to 55-60% by 1/16.  Down to 45-50% on 1/19 echo but was in the setting of atrial fibrillation.  TEE in 6/20 showed EF 50%. TEE in 2/22 showed EF 45-50%.  NYHA class II-III symptoms, worse in atrial fibrillation.  He is volume overloaded today in the setting of recurrent atrial fibrillation.   - Increase Lasix to 60 mg bid (says he has been taking 40 mg bid at home).  BMET/BNP today and BMET in 10 days.      - Continue ramipril 2.5 mg bid.   - Continue spironolactone 25 mg daily.  - Off Jardiance with E coli UTI.   - Continue Toprol XL 25 mg bid.  9. HTN: BP generally controlled.    10. Diabetes: Jardiance stopped with E coli UTI.  11.  OSA: Daytime sleepiness and fatigue. He says that he will not use CPAP so we will defer sleep study.   Followup with APP after DCCV.   [name] 02/11/2022

## 2022-02-12 ENCOUNTER — Telehealth (HOSPITAL_COMMUNITY): Payer: Self-pay | Admitting: Cardiology

## 2022-02-12 MED ORDER — FUROSEMIDE 20 MG PO TABS: ORAL_TABLET | ORAL | 6 refills | Status: DC

## 2022-02-12 NOTE — Telephone Encounter (Signed)
Pt aware.

## 2022-02-12 NOTE — Telephone Encounter (Signed)
Attempted to return call no answer unable to leave VM ?[phone]  ?

## 2022-02-12 NOTE — Telephone Encounter (Signed)
Increase Lasix to 40 bid x 2 days then 40 qam/20 qpm after that.  ?

## 2022-02-12 NOTE — Telephone Encounter (Signed)
Pt called to report his dosage of lasix was only 20 mg BID and not 40 mg BID ? ?-should he still increase to 60 BID?  ?

## 2022-02-13 NOTE — Progress Notes (Signed)
Phone: [phone]   Subjective:  Patient presents today to establish care.  Prior patient of Dr. Rosana Hoes urologist and Dr. Elyse Hsu endocrine. Has not had recent PCP.  Chief Complaint  Patient presents with   Establish Care   Hypertension   Atrial Fibrillation   Congestive Heart Failure   See problem oriented charting  The following were reviewed and entered/updated in epic: Past Medical History:  Diagnosis Date   AAA (abdominal aortic aneurysm)    Atrial fibrillation (HCC)    Atrial fibrillation (HCC)    CAD (coronary artery disease)    Cataract    CHF (congestive heart failure) (HCC)    Chronic kidney disease    Colon polyps    adenomatous   Diabetes mellitus    Diverticulosis    Dysrhythmia    A-Fib   GERD (gastroesophageal reflux disease)    Gout    Hernia, abdominal    History of aortic valve replacement with bioprosthetic valve    2007   Hx: UTI (urinary tract infection)    Hyperlipemia    Hypertension    Internal hemorrhoids    Peripheral vascular disease (HCC)    Shortness of breath    with exertion   Patient Active Problem List   Diagnosis Date Noted   Controlled type 2 diabetes mellitus with complication, without long-term current use of insulin ([place]) 02/19/2022    Priority: High   Afib ([place]) 06/12/2019    Priority: High   Chronic combined systolic and diastolic heart failure ([place]) 06/12/2014    Priority: High   Atrial fibrillation with rapid ventricular response ([place]) 04/20/2014    Priority: High   CAD (coronary artery disease) of artery bypass graft 09/14/2012    Priority: High   Carotid artery disease without cerebral infarction ([place]) 09/14/2012    Priority: High   History of aortic valve replacement with bioprosthetic valve 09/14/2012    Priority: High   Aortic atherosclerosis ([place]) 02/19/2022    Priority: Medium    Chronic gout without tophus 02/19/2022    Priority: Medium    Urinary retention 10/11/2021    Priority: Medium     Asymptomatic carotid artery stenosis without infarction, right 02/02/2018    Priority: Medium    Solitary pulmonary nodule 03/23/2016    Priority: Medium    Pulmonary nodule 12/25/2015    Priority: Medium    AAA (abdominal aortic aneurysm) without rupture 04/13/2014    Priority: Medium    Occlusion and stenosis of carotid artery without mention of cerebral infarction 04/09/2014    Priority: Medium    Abdominal aortic aneurysm 09/14/2012    Priority: Medium    Peripheral vascular disease with claudication ([place]) 09/14/2012    Priority: Medium    Essential hypertension, malignant 09/14/2012    Priority: Medium    Hyperlipidemia with target LDL less than 70 09/14/2012    Priority: Medium    Secondary hypercoagulable state ([place]) 02/19/2022    Priority: Low   Rhabdomyolysis 10/10/2021    Priority: Low   Aspiration of foreign body     Priority: Low   Foreign body aspiration 12/23/2015    Priority: Low   Encounter for therapeutic drug monitoring 03/28/2015    Priority: Low   Past Surgical History:  Procedure Laterality Date   ABDOMINAL AORTIC ENDOVASCULAR STENT GRAFT N/A 04/13/2014   Procedure: ABDOMINAL AORTIC ENDOVASCULAR STENT GRAFT;  Surgeon: Serafina Mitchell, MD;  Location: [facility] OR;  Service: Vascular;  Laterality: N/A;   ANGIOPLASTY  Unsure if he had stents put in    [place] N/A 06/06/2014   Procedure: CARDIOVERSION;  Surgeon: Larey Dresser, MD;  Location: [place];  Service: Cardiovascular;  Laterality: N/A;   CARDIOVERSION N/A 08/21/2014   Procedure: CARDIOVERSION;  Surgeon: Larey Dresser, MD;  Location: [place];  Service: Cardiovascular;  Laterality: N/A;   CARDIOVERSION N/A 12/22/2017   Procedure: CARDIOVERSION;  Surgeon: Larey Dresser, MD;  Location: [facility] ENDOSCOPY;  Service: Cardiovascular;  Laterality: N/A;   CARDIOVERSION N/A 05/25/2019   Procedure: CARDIOVERSION;  Surgeon: Larey Dresser, MD;  Location: [place];  Service: Cardiovascular;  Laterality: N/A;   CARDIOVERSION N/A 06/14/2019   Procedure: CARDIOVERSION;  Surgeon: Jerline Pain, MD;  Location: [facility] ENDOSCOPY;  Service: Cardiovascular;  Laterality: N/A;   CARDIOVERSION N/A 12/12/2020   Procedure: CARDIOVERSION;  Surgeon: Larey Dresser, MD;  Location: [facility] ENDOSCOPY;  Service: Cardiovascular;  Laterality: N/A;   CARDIOVERSION N/A 01/29/2021   Procedure: CARDIOVERSION;  Surgeon: Larey Dresser, MD;  Location: [place];  Service: Cardiovascular;  Laterality: N/A;   CORONARY ARTERY BYPASS GRAFT     ENDARTERECTOMY Right 02/02/2018   Procedure: ENDARTERECTOMY CAROTID RIGHT;  Surgeon: Serafina Mitchell, MD;  Location: [facility] OR;  Service: Vascular;  Laterality: Right;   EYE SURGERY Bilateral    cataract surgery   HERNIA REPAIR     2 operations   LEFT HEART CATHETERIZATION WITH CORONARY/GRAFT ANGIOGRAM N/A 07/10/2014   Procedure: LEFT HEART CATHETERIZATION WITH Beatrix Fetters;  Surgeon: Larey Dresser, MD;  Location: [facility] CATH LAB;  Service: Cardiovascular;  Laterality: N/A;   TEE WITHOUT CARDIOVERSION N/A 05/25/2019   Procedure: TRANSESOPHAGEAL ECHOCARDIOGRAM (TEE);  Surgeon: Larey Dresser, MD;  Location: [facility] ENDOSCOPY;  Service: Cardiovascular;  Laterality: N/A;   TEE WITHOUT CARDIOVERSION N/A 01/29/2021   Procedure: TRANSESOPHAGEAL ECHOCARDIOGRAM (TEE);  Surgeon: Larey Dresser, MD;  Location: [facility] ENDOSCOPY;  Service: Cardiovascular;  Laterality: N/A;   TONSILLECTOMY     VIDEO BRONCHOSCOPY Bilateral 12/25/2015   Procedure: VIDEO BRONCHOSCOPY WITHOUT FLUORO;  Surgeon: Javier Glazier, MD;  Location: [place];  Service: Cardiopulmonary;  Laterality: Bilateral;    Family History  Problem Relation Age of Onset   Heart disease Mother    Heart disease Father        before age 49   AAA (abdominal aortic aneurysm) Father    Colon cancer Neg Hx     Medications- reviewed and updated Current Outpatient Medications   Medication Sig Dispense Refill   Coenzyme Q10 (CO Q10) 200 MG CAPS Take 200 mg by mouth daily.     dofetilide (TIKOSYN) 250 MCG capsule TAKE ONE CAPSULE TWICE A DAY 60 capsule 11   doxazosin (CARDURA) 1 MG tablet Daily after supper 90 tablet 3   furosemide (LASIX) 20 MG tablet Take 2 tablets (40 mg total) by mouth in the morning AND 1 tablet (20 mg total) every evening. 90 tablet 6   latanoprost (XALATAN) 0.005 % ophthalmic solution Place 1 drop into both eyes daily.     metoprolol tartrate (LOPRESSOR) 25 MG tablet Take 1 tablet (25 mg total) by mouth 2 (two) times daily. 180 tablet 3   Misc Natural Products (GLUCOSAMINE CHONDROITIN TRIPLE) TABS Take 1 tablet by mouth daily.     ramipril (ALTACE) 2.5 MG capsule Take 1 capsule (2.5 mg total) by mouth 2 (two) times daily. 90 capsule Belva  140 MG/ML SOAJ INJECT 1 PEN INTO SKIN EVERY 14 DAYS 2 mL 11   spironolactone (ALDACTONE) 25 MG tablet Take 0.5 tablets (12.5 mg total) by mouth daily. (Patient taking differently: Take 25 mg by mouth daily.) 45 tablet 3   timolol (TIMOPTIC) 0.5 % ophthalmic solution Place 1 drop into both eyes at bedtime.  5   warfarin (COUMADIN) 3 MG tablet TAKE 3 TABLETS DAILY EXCEPT 2 TABLETS ONMONDAY AND FRIDAY OR AS DIRECTED BY CLINIC 90 tablet 2   allopurinol (ZYLOPRIM) 300 MG tablet Take 1 tablet (300 mg total) by mouth daily. 90 tablet 3   atorvastatin (LIPITOR) 20 MG tablet Take 1 tab daily every other day ALTERNATING with 1.5 tabs daily every other day 135 tablet 3   doxazosin (CARDURA) 2 MG tablet Take 1 tablet (2 mg total) by mouth every morning. 1 Tablet(s) By Mouth Every Evening 90 tablet 3   ezetimibe (ZETIA) 10 MG tablet Take 1 tablet (10 mg total) by mouth at bedtime. 90 tablet 3   tamsulosin (FLOMAX) 0.4 MG CAPS capsule Take 1 capsule (0.4 mg total) by mouth 2 (two) times daily. 180 capsule 3   No current facility-administered medications for this visit.    Allergies-reviewed and  updated Allergies  Allergen Reactions   Grass Extracts [Gramineae Pollens] Other (See Comments)    Sneezing and runny nose   Milk-Related Compounds Other (See Comments)    Just Yogurt.  Makes him very lethargic.   Amiodarone Anxiety    depression    Social History   Social History Narrative   Widowed (lives alone at [place])- wife died at 85 (cancer at 37). Daughter 27, son 19 in 2023. 5 grandkids. 5 greatgrandkids.    -when patient were 65- started caring for 3 of his grandkids when daughter was not in a good place in the airforce.       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       Hobbies: golf, enjoys football and basketball at [employer]    Objective  Objective:  BP 112/73    Pulse 97    Temp 98.1 F (36.7 C) (Temporal)    Ht 5' 10.5" (1.791 m)    Wt 198 lb 9.6 oz (90.1 kg)    SpO2 96%    BMI 28.09 kg/m  Gen: NAD, resting comfortably CV: RRR no murmurs rubs or gallops Lungs: CTAB no crackles, wheeze, rhonchi Ext: trace to 1+ edema Skin: warm, dry Neuro: Normal gait-walks without assistive device.  Hard of hearing   Assessment and Plan:   # CHF- systolic and diastolic combined-follows with Dr. Aundra Dubin #hypertension S: medication: spironolactone 25 mg daily (per patient) metoprolol 25 mg BID, cardura 1 mg, ramipril 2.5 mg BID and Lasix 40 mg in morning and 20 mg in the evening  Patient reports stable edema and no increased shortness of breath.  He reports being compliant with Lasix and other medications listed above.  Weight appears roughly stable in recent months  A/P: CHF appears euvolemic-continue current medication  Hypertension- Controlled. Continue current medications.    #CAD- CABG and redo CABG -also has bioprosthetic aortic valve replacement history #PAD- >50% bilateral focal SFA stenosis- not cilostazol candidate with CHF #Carotid stenosis- s/p CEA in 2019- follows with Dr. Trula Slade #hyperlipidemia/aortic atherosclerosis S: Medication:Zetia 10 mg at  bedtime,  atorvastatin 20 mg and 30 mg on alternating days (coQ10), also prescribed repatha- has a hard time mentally self injecting- and #s go up  No chest pain or  shortness of breath reported.  He did not report claudication today though we did not specifically discuss this Lab Results  Component Value Date   CHOL 203 (H) 10/03/2021   HDL 49 10/03/2021   LDLCALC 113 (H) 10/03/2021   LDLDIRECT 98 05/08/2009   TRIG 203 (H) 10/03/2021   CHOLHDL 4.1 10/03/2021    A/P: CAD appears stable/asymptomatic-continue Zetia, atorvastatin and he is going to take his Repatha more consistently.  Also continue Eliquis without aspirin per cardiology  PAD and carotid stenosis likely stable-LDL goal would be 70 or less in light of these diagnosis and CAD-last levels were mildly elevated and once again encouraged him to take his Repatha consistently  # atrial fibrillation S:medication: dofetilide/tikosyn 250 mcg BID, metoprolol 25 mg XL, coumadin - had been managed by cardiology clinic -history of cardioversion with plan for repeat in march 2023 -CNS effects on amiodarone in past -difficulty with rate control in past so plan has been to stay out of a fib  Was out of coumadin for several days- this is delaying cardioversion- has levels checked tomorrow but unlikely to be at goal A/P: Atrial fibrillation appears to be present today-he is working on getting his Coumadin levels to goal through cardiology clinic.  With heart rate high normal today-and tendency toward RVR-he is try to get set up for cardioversion -Secondary hypercoagulable state noted as consequence of atrial fibrillation  # Diabetes- prior followed by Dr. Elyse Hsu A1C- 05/20/2021- 7.0 S: Medication:none.  Patient thinks he may have been on Januvia in the past but I do not see on most recent endocrine note -UTI on jardiance  A/P: I may assume care of this depending on patient's level of control-for now continue without medicine update A1c  #  BPH S:medication: tamsulosin 0.5mg  BID and cardura 2 mg in the morning and 1 mg in the evening-confirmed on last note with endocrine A/P: Patient reports reasonable control-continue current medication  # AAA S:endovascular repair 2015- follows with Dr. Trula Slade now  A/P: Has regular follow-up and imaging with vascular surgery-we will try to maintain risk factors such as hypertension to avoid progression  #Gout S: 0 flares in years on allopurinol 300mg  A/P: Doing well-we will come back for uric acid level with labs  # Chest CT- with lung nodule 01/05/18- this was showing stability at 2 years- needed 5 year follow up-due for a final scan.  -We ordered follow-up chest CT today   Recommended follow up: Return in about 4 months (around 06/21/2022) for follow up- or sooner if needed. Future Appointments  Date Time Provider [place]  02/20/2022  1:30 PM [facility]  03/05/2022  8:30 AM [facility]  03/06/2022  1:30 PM [facility] PA/NP [facility] None  06/23/2022  3:00 PM Marin Olp, MD [facility]    Meds ordered this encounter  Medications   allopurinol (ZYLOPRIM) 300 MG tablet    Sig: Take 1 tablet (300 mg total) by mouth daily.    Dispense:  90 tablet    Refill:  3   ezetimibe (ZETIA) 10 MG tablet    Sig: Take 1 tablet (10 mg total) by mouth at bedtime.    Dispense:  90 tablet    Refill:  3   atorvastatin (LIPITOR) 20 MG tablet    Sig: Take 1 tab daily every other day ALTERNATING with 1.5 tabs daily every other day    Dispense:  135 tablet    Refill:  3  doxazosin (CARDURA) 2 MG tablet    Sig: Take 1 tablet (2 mg total) by mouth every morning. 1 Tablet(s) By Mouth Every Evening    Dispense:  90 tablet    Refill:  3   doxazosin (CARDURA) 1 MG tablet    Sig: Daily after supper    Dispense:  90 tablet    Refill:  3   metoprolol tartrate (LOPRESSOR) 25 MG tablet    Sig: Take 1 tablet (25 mg total) by mouth 2 (two) times  daily.    Dispense:  180 tablet    Refill:  3   tamsulosin (FLOMAX) 0.4 MG CAPS capsule    Sig: Take 1 capsule (0.4 mg total) by mouth 2 (two) times daily.    Dispense:  180 capsule    Refill:  3    Further refills need to come from PCP.    Time Spent: 61 minutes of total time (4:30 PM- 5:31PM, ) was spent on the date of the encounter performing the following actions: chart review prior to seeing the patient, obtaining history, performing a medically necessary exam, counseling on the treatment plan, placing orders, and documenting in our EHR.    I,Jada Bradford,acting as a scribe for Garret Reddish, MD.,have documented all relevant documentation on the behalf of Garret Reddish, MD,as directed by  Garret Reddish, MD while in the presence of Garret Reddish, MD.   I, Garret Reddish, MD, have reviewed all documentation for this visit. The documentation on 02/19/22 for the exam, diagnosis, procedures, and orders are all accurate and complete.   Return precautions advised. Garret Reddish, MD

## 2022-02-16 ENCOUNTER — Encounter (HOSPITAL_COMMUNITY): Payer: Self-pay | Admitting: Cardiology

## 2022-02-16 ENCOUNTER — Encounter (HOSPITAL_COMMUNITY): Payer: Self-pay | Admitting: Anesthesiology

## 2022-02-16 ENCOUNTER — Ambulatory Visit (HOSPITAL_COMMUNITY): Admission: RE | Admit: 2022-02-16 | Payer: Medicare Other | Source: Ambulatory Visit | Admitting: Cardiology

## 2022-02-16 ENCOUNTER — Other Ambulatory Visit (HOSPITAL_COMMUNITY): Payer: Self-pay

## 2022-02-16 ENCOUNTER — Telehealth: Payer: Self-pay | Admitting: *Deleted

## 2022-02-16 ENCOUNTER — Telehealth (HOSPITAL_COMMUNITY): Payer: Self-pay

## 2022-02-16 ENCOUNTER — Ambulatory Visit (HOSPITAL_COMMUNITY)
Admission: RE | Admit: 2022-02-16 | Discharge: 2022-02-16 | Disposition: A | Discharge status: Home / Self Care | Payer: Medicare Other | Attending: Cardiology | Admitting: Cardiology

## 2022-02-16 ENCOUNTER — Encounter (HOSPITAL_COMMUNITY)
Admission: RE | Disposition: A | Discharge status: Home / Self Care | Payer: Self-pay | Source: Home / Self Care | Attending: Cardiology

## 2022-02-16 DIAGNOSIS — Z539 Procedure and treatment not carried out, unspecified reason: Secondary | ICD-10-CM | POA: Diagnosis not present

## 2022-02-16 DIAGNOSIS — E119 Type 2 diabetes mellitus without complications: Secondary | ICD-10-CM | POA: Diagnosis not present

## 2022-02-16 DIAGNOSIS — I480 Paroxysmal atrial fibrillation: Secondary | ICD-10-CM

## 2022-02-16 DIAGNOSIS — I4891 Unspecified atrial fibrillation: Secondary | ICD-10-CM | POA: Insufficient documentation

## 2022-02-16 LAB — PROTIME-INR
INR: 1.3 — ABNORMAL HIGH (ref 0.8–1.2)
Prothrombin Time: 16 seconds — ABNORMAL HIGH (ref 11.4–15.2)

## 2022-02-16 LAB — GLUCOSE, CAPILLARY: Glucose-Capillary: 123 mg/dL — ABNORMAL HIGH (ref 70–99)

## 2022-02-16 SURGERY — CANCELLED PROCEDURE

## 2022-02-16 MED ORDER — SODIUM CHLORIDE 0.9 % IV SOLN: INTRAVENOUS | Status: DC

## 2022-02-16 NOTE — Anesthesia Preprocedure Evaluation (Deleted)
Anesthesia Evaluation  ?Patient identified by MRN, date of birth, ID band ?Patient awake ? ? ? ?Reviewed: ?Allergy & Precautions, NPO status , Patient's Chart, lab work & pertinent test results ? ?Airway ?Mallampati: II ? ?TM Distance: >3 FB ?Neck ROM: Full ? ? ? Dental ?no notable dental hx. ? ?  ?Pulmonary ?neg pulmonary ROS, former smoker,  ?  ?Pulmonary exam normal ? ? ? ? ? ? ? Cardiovascular ?hypertension, Pt. on medications and Pt. on home beta blockers ?+ CAD, + Peripheral Vascular Disease and +CHF  ?+ dysrhythmias Atrial Fibrillation + Valvular Problems/Murmurs (s/p AVR 2007)  ?Rhythm:Irregular Rate:Normal ? ? ?  ?Neuro/Psych ?negative neurological ROS ? negative psych ROS  ? GI/Hepatic ?Neg liver ROS, GERD  ,  ?Endo/Other  ?diabetes ? Renal/GU ?  ?negative genitourinary ?  ?Musculoskeletal ?negative musculoskeletal ROS ?(+)  ? Abdominal ?Normal abdominal exam  (+)   ?Peds ? Hematology ?negative hematology ROS ?(+)   ?Anesthesia Other Findings ? ? Reproductive/Obstetrics ? ?  ? ? ? ? ? ? ? ? ? ? ? ? ? ?  ?  ? ? ? ? ? ? ? ? ?Anesthesia Physical ?Anesthesia Plan ? ?ASA: 3 ? ?Anesthesia Plan: MAC  ? ?Post-op Pain Management:   ? ?Induction: Intravenous ? ?PONV Risk Score and Plan: 1 and Propofol infusion and Treatment may vary due to age or medical condition ? ?Airway Management Planned: Simple Face Mask, Natural Airway and Nasal Cannula ? ?Additional Equipment: None ? ?Intra-op Plan:  ? ?Post-operative Plan:  ? ?Informed Consent: I have reviewed the patients History and Physical, chart, labs and discussed the procedure including the risks, benefits and alternatives for the proposed anesthesia with the patient or authorized representative who has indicated his/her understanding and acceptance.  ? ? ? ?Dental advisory given ? ?Plan Discussed with: CRNA ? ?Anesthesia Plan Comments:   ? ? ? ? ? ? ?Anesthesia Quick Evaluation ? ?

## 2022-02-16 NOTE — Telephone Encounter (Addendum)
Pt contacted and aware of date for rescheduled appointment  ? ?Tried calling patient about his rescheduled DCCV/TEE on 03/02/2022 @ 12.00. Was unable to leave message. Will try again later. ?

## 2022-02-16 NOTE — Progress Notes (Signed)
Pt presented today for TEE/DCCV. INR sub therapeutic. Per MD Delon [place] cancelled.  ?

## 2022-02-16 NOTE — Telephone Encounter (Signed)
Called since he pending DCCV on 03/02/2022 per [facility] message. Pt's INR was 1.3 today when he went to have a DCCV today and it was canceled.  ? ?Spoke with pt and advised him to take 4 tablets of Warfarin tonight, then resume taking 3 tablets daily except 2 tablets on Fridays. He verbalized understanding. He stated since moving to [place] he has been eating a lot of leafy veggies cause that is primarily what they serve. Re-educated on green intake and warfarin and he verbalized understanding. Also, advised pt that he will need a weekly checks until procedure and he verbalized understanding.  ? ?Also, moved pt's INR appt to Thursday from Wednesday and confirmed. Pt states his only sister is on Hospice in [place] and may have to go there if the inevitable happens and he states Dr. Aundra Dubin is aware if he has to cancel the upcoming procedure.  ? ? ?

## 2022-02-18 ENCOUNTER — Other Ambulatory Visit (HOSPITAL_COMMUNITY): Payer: Self-pay | Admitting: Cardiology

## 2022-02-18 ENCOUNTER — Other Ambulatory Visit (HOSPITAL_COMMUNITY): Payer: Self-pay | Admitting: *Deleted

## 2022-02-19 ENCOUNTER — Telehealth: Payer: Self-pay

## 2022-02-19 ENCOUNTER — Encounter: Payer: Self-pay | Admitting: Family Medicine

## 2022-02-19 ENCOUNTER — Ambulatory Visit (INDEPENDENT_AMBULATORY_CARE_PROVIDER_SITE_OTHER): Payer: Medicare Other | Admitting: Family Medicine

## 2022-02-19 VITALS — BP 112/73 | HR 97 | Temp 98.1°F | Ht 70.5 in | Wt 198.6 lb

## 2022-02-19 DIAGNOSIS — I5042 Chronic combined systolic (congestive) and diastolic (congestive) heart failure: Secondary | ICD-10-CM | POA: Diagnosis not present

## 2022-02-19 DIAGNOSIS — I10 Essential (primary) hypertension: Secondary | ICD-10-CM

## 2022-02-19 DIAGNOSIS — E785 Hyperlipidemia, unspecified: Secondary | ICD-10-CM | POA: Diagnosis not present

## 2022-02-19 DIAGNOSIS — R911 Solitary pulmonary nodule: Secondary | ICD-10-CM

## 2022-02-19 DIAGNOSIS — E118 Type 2 diabetes mellitus with unspecified complications: Secondary | ICD-10-CM

## 2022-02-19 DIAGNOSIS — I25810 Atherosclerosis of coronary artery bypass graft(s) without angina pectoris: Secondary | ICD-10-CM | POA: Diagnosis not present

## 2022-02-19 DIAGNOSIS — I4891 Unspecified atrial fibrillation: Secondary | ICD-10-CM | POA: Diagnosis not present

## 2022-02-19 DIAGNOSIS — N401 Enlarged prostate with lower urinary tract symptoms: Secondary | ICD-10-CM | POA: Diagnosis not present

## 2022-02-19 DIAGNOSIS — D6869 Other thrombophilia: Secondary | ICD-10-CM

## 2022-02-19 DIAGNOSIS — I779 Disorder of arteries and arterioles, unspecified: Secondary | ICD-10-CM

## 2022-02-19 DIAGNOSIS — R338 Other retention of urine: Secondary | ICD-10-CM

## 2022-02-19 DIAGNOSIS — I700 Atherosclerosis of aorta: Secondary | ICD-10-CM | POA: Diagnosis not present

## 2022-02-19 DIAGNOSIS — M1A9XX0 Chronic gout, unspecified, without tophus (tophi): Secondary | ICD-10-CM | POA: Diagnosis not present

## 2022-02-19 DIAGNOSIS — I5043 Acute on chronic combined systolic (congestive) and diastolic (congestive) heart failure: Secondary | ICD-10-CM

## 2022-02-19 MED ORDER — ATORVASTATIN CALCIUM 20 MG PO TABS: ORAL_TABLET | ORAL | 3 refills | Status: DC

## 2022-02-19 MED ORDER — TAMSULOSIN HCL 0.4 MG PO CAPS: 0.4000 mg | ORAL_CAPSULE | Freq: Two times a day (BID) | ORAL | 3 refills | Status: DC

## 2022-02-19 MED ORDER — ALLOPURINOL 300 MG PO TABS: 300.0000 mg | ORAL_TABLET | Freq: Every day | ORAL | 3 refills | Status: DC

## 2022-02-19 MED ORDER — DOXAZOSIN MESYLATE 1 MG PO TABS
ORAL_TABLET | ORAL | 3 refills | Status: DC
Start: 2022-02-19 — End: 2022-07-15

## 2022-02-19 MED ORDER — METOPROLOL TARTRATE 25 MG PO TABS: 25.0000 mg | ORAL_TABLET | Freq: Two times a day (BID) | ORAL | 3 refills | Status: DC

## 2022-02-19 MED ORDER — DOXAZOSIN MESYLATE 2 MG PO TABS: 2.0000 mg | ORAL_TABLET | ORAL | 3 refills | Status: DC

## 2022-02-19 MED ORDER — EZETIMIBE 10 MG PO TABS: 10.0000 mg | ORAL_TABLET | Freq: Every day | ORAL | 3 refills | Status: DC

## 2022-02-19 NOTE — Patient Instructions (Addendum)
Health Maintenance Due  ?Topic Date Due  ? TETANUS/TDAP  ? ?recommended at pharmacy ? Never done  ? Zoster Vaccines- Shingrix (1 of 2) ? ?Please check with your pharmacy to see if they have the shingrix vaccine. If they do- please get this immunization and update [place] by phone call or mychart with dates you receive the vaccine ? Never done  ? Pneumonia Vaccine 62+ Years old (1 - PCV) ? ?Consider future visit Never done  ? ?Schedule a lab visit at the check out desk within 2 weeks. Return for future fasting labs meaning nothing but water after midnight please. Ok to take your medications with water.  ? ?We will call you within two weeks about your referral for ct scan to follow up on lung nodule. If you do not hear within 2 weeks, give [place] a call.  ? ?Recommended follow up: Return in about 4 months (around 06/21/2022) for follow up- or sooner if needed. ?

## 2022-02-19 NOTE — Telephone Encounter (Signed)
Called pt because he missed his anticoagulation appt this morning at 8:30am. Pt stated he knew he had an appt this morning; however, he has not taken his Warfarin for the past 3-4 days and did not see any point in coming today. Pt explained that he ran out of Warfarin a few days ago, contacted [facility] for a refill but he typically gets Warfarin delivered and pharmacy was unable to deliver medication. I educated Mr Hardage on the importance of coming today to have INR checked because his INR is probably very low due to 3-4 missed doses. I made him aware of risk. Unfortunately, pt did not agree to come today because he "does not see the need when he knows his INR is low". After long discussion and education, pt agreed to come tomorrow to have INR checked. ? ?Pt has an appt today to become established with Dr Yong Channel. Appt note states "Dr. Yong Channel to take over script for Coum from Cardiologist". Will follow-up on today's office visit with Dr Yong Channel to determine if pt's Coumadin will be established at [facility] office in the future.  ?

## 2022-02-20 ENCOUNTER — Encounter (HOSPITAL_COMMUNITY): Payer: Self-pay | Admitting: Cardiology

## 2022-02-20 ENCOUNTER — Telehealth (HOSPITAL_COMMUNITY): Payer: Self-pay | Admitting: *Deleted

## 2022-02-20 NOTE — Telephone Encounter (Signed)
Pt called to cancel TEE/DCCV his sister passed away and the funeral will be in  ?[place] on the day of the procedure. Pt said he wanted to wait until after the funeral to call back and r/s appt. Endo notified appt cancelled.  ?

## 2022-03-02 ENCOUNTER — Ambulatory Visit (HOSPITAL_COMMUNITY): Admit: 2022-03-02 | Payer: Medicare Other | Admitting: Cardiology

## 2022-03-02 ENCOUNTER — Encounter (HOSPITAL_COMMUNITY): Payer: Self-pay

## 2022-03-02 SURGERY — ECHOCARDIOGRAM, TRANSESOPHAGEAL
Anesthesia: General

## 2022-03-02 NOTE — Progress Notes (Signed)
Patient ID: Nathaniel Pulis., male   DOB: December 21, 1931, 88 y.o.   MRN: [id number] ?PCP: Dr. Elyse Hsu ?Cardiology: Dr. Aundra Dubin ? ?88 y.o. with history of extensive vascular disease including CAD s/p CABG and redo CABG, AAA s/p repair, PAD, and carotid stenosis as well as prior AVR presents for followup of CAD and atrial fibrillation.   Nathaniel [name] has had documentation of significant peripheral arterial disease.  Nathaniel [name] gets bilateral calf soreness after walking for about 15 minutes.  This resolves with rest, no rest pain.  No pedal ulcers, though when Nathaniel [name] gets cuts on his feet, they heal slowly.  Peripheral arterial dopplers done in 10/14 showed > 50% focal SFA stenoses bilaterally.  Nathaniel [name] has carotid artery disease.  No stroke-like symptoms.  Nathaniel [name] had a endovascular AAA repair in 5/15.   ? ?After his AAA repair, Nathaniel [name] was noted to be in atrial fibrillation.  Nathaniel [name] had had a prior episode of atrial fibrillation after his cardiac surgery in 2007.  Atrial fibrillation was rate-controlled.  Nathaniel [name] was started on warfarin and Toprol XL.  Nathaniel [name] developed exertional dyspnea and fatigue in atrial fibrillation. I cardioverted him back to NSR on 06/06/14.  I also had him get an echocardiogram in 6/15.  This showed EF worsened to 25-30% with stable bioprosthetic aortic valve, mildly dilated RV with normal systolic function.  Given the fall in EF, I took him for [facility] in 7/15.  This showed stable anatomy.  The LIMA-LAD was patent, the sequential SVG-OM/ramus/D was patent only to ramus but this is the same as the prior cath, and the SVG-PDA was patent with a long 50-60% mid-graft stenosis.  No intervention.  Of note, at cath Nathaniel [name] was noted to be back in atrial fibrillation.  I then started him on amiodarone.  I took [name] for [place] again on 08/21/14.  This was successful.  Nathaniel [name] has been off amiodarone due to side effects (hallucinations).  Echo in 1/16 showed recovery of EF to 55-60%.   ? ?Nathaniel [name] was admitted in 1/19 with bronchitis/wheezing/dyspnea and was found to be in  atrial fibrillation with RVR.  Nathaniel [name] tested positive for metapneumovirus.  Echo showed EF 45-50% with normal bioprosthetic aortic valve. Nathaniel [name] was diuresed for volume overload while in the hospital. Nathaniel [name] had DCCV to NSR.   ? ?Carotid dopplers in 1/19 showed 80-99% RICA stenosis, Nathaniel [name] had right CEA in 2/02 with no complications.  ? ?At appointment in early 6/20, [name] was noted to be back in atrial fibrillation with RVR and mild CHF.  Nathaniel [name] was started on Toprol XL and Lasix was increased.  Nathaniel [name] had TEE-guided DCCV to NSR in 6/20.  TEE showed EF 50%, mild diffuse hypokinesis, mildly decreased RV systolic function, normally functioning bioprosthetic aortic valve.  ? ?In 2021, Nathaniel [name] was admitted for Tikosyn initiation.  In 12/21, Nathaniel [name] went into atrial fibrillation again and was cardioverted back to NSR.  ? ?Echo in 1/22 showed EF 45-50%, diffuse hypokinesis, mildly decreased RV systolic function, moderate biatrial enlargement, bioprosthetic valve with mean gradient 8 mmHg.   ? ?In 2/22, Nathaniel [name] went into atrial fibrillation and had TEE-guided DCCV back to NSR.  TEE showed EF 45-50%.   ? ?In 3/22, Nathaniel [name] was back in the ER with atrial fibrillation triggered by [name] most likely.  Nathaniel [name] had a cardioversion back to NSR.  ? ?In 10/22, patient developed urinary retention and an E coli UTI.  In the setting of this, Nathaniel [name] had a fall at home and was unable to  get up due to weakness for hours.  Nathaniel [name] developed rhabdomyolysis.  This resolved with minimal renal damage with IV fluid hydration and Nathaniel [name] was discharged to rehab.  ? ?Follow up 2/23 Nathaniel [name] was in atrial fibrillation, TEE/DCCV arranged however INR subtherapeutic. Nathaniel [name] cancelled rescheduled procedure due to a death in the family. ? ?Today Nathaniel [name] returns for HF follow up. Overall feeling fine. Nathaniel [name] is at [facility] independent living and is eating more frequently. Nathaniel [name] is SOB walking further distances on flat ground. Nathaniel [name] does not weigh at facility but feels Nathaniel [name] has more fluid in legs, also with a wound to left leg  that is not healing. Denies palpitations, abnormal bleeding, CP, dizziness, or PND/Orthopnea. Appetite ok. No fever or chills. Taking all medications. Nathaniel [name] did not increase his Lasix as instructed las visit. ? ?ECG: Atrial fibrillation,  QTc 494 (personally reviewed) ? ?Labs (5/15): K 4.3, creatinine 0.89 ?Labs (6/15): K 4.3, creatinine 1.4, BNP 112 ?Labs (7/15): K 4.4, creatinine 1.2, HCT 40.2 ?Labs (8/15): K 4, creatinine 1.2, BNP 113, LFTs and TSH normal ?Labs (10/15): K 4.4, creatinine 1.4 ?Labs (5/16): K 4.7, creatinine 1.18, HDL 52, LDL 98 ?Labs (6/16): LDL 118, LFTs normal ?Labs (1/17): HCT 40.2, K 3.7, creatinine 1.04 ?Labs (1/19): K 4.9, creatinine 1.19 ?Labs (2/19): K 4.2, creatinine 0.89 ?Labs (3/19): LDL 86, HDL 45, TGs 245 ?Labs (5/19): LDL 110, HDL 39 ?Labs (6/19): K 4.1, creatinine 1.07 ?Labs (11/19): LDL 14, HDL 56 ?Labs (6/20): K 4.1, creatinine 1.0 ?Labs (7/20): K 4.3, creatinine 1.07 ?Labs (9/21): LDL 129, hgb 14.8, K 4.1, creatinine 1.02 ?Labs (12/21): LDL 20, HDL 46, K 4.4, creatinine 1.23 ?Labs (1/22): LDL 45, HDL 49, K 4.3, creatinine 1.1, BNP 184 ?Labs (3/22): K 4.4, creatinine 1.10, LDL 15, HDL 49 ?Labs (6/22): K 4.3, creatinine 1.11, LDL 121 ?Labs (7/22): K 4.3, creatinine 1.23, hgb 14.5 ?Labs (11/22): K 4.5, creatinine 1.14 ?Labs (2/23): K 4.3, creatinine 1.16 ? ?PMH: ?1. PAD: Peripheral arterial dopplers in 2012 showed > 75% bilateral SFA stenosis. Peripheral arterial dopplers in 10/14 showed > 50% focal bilateral SFA stenoses.  ?2. AAA: [place] 4/15 with 4.4 cm AAA but concern for penetrating ulcers. CTA abdomen showed 4.4 cm AAA with penetrating ulcers and possible pseudoaneurysms. Now s/p endovascular repair of AAA in 9/16 with no complications.  ?3. Carotid stenosis: Carotid dopplers (4/15) with 60-79% bilateral ICA stenosis. Carotid dopplers (11/15) with 60-79% bilateral ICA stenosis.  Carotid dopplers 6/16 with 60-79% bilateral ICA stenosis. ?- Carotid dopplers (1/18): 60-79% RICA  stenosis.  ?- Carotid dopplers (1/19): 80-99% RICA stenosis => Right CEA in 2/19.  ?- Carotid dopplers (11/19): 40-59% bilateral stenosis.  ?- Carotid dopplers (1/21): 1-39% BICA stenosis.  ?4. CAD: CABG 1989 with LIMA-LAD, SVG-D, seq SVG-ramus and OM, sequential SVG-PDA/PLV. SVG-PDA/PLV found to be occluded on cath prior to AVR, so patient had SVG-PDA with AVR in 2007. LHC (7/15) with LIMA-LAD patent with 40-50% stenosis in LAD after touchdown, sequential SVG-ramus/OM/diagonal with only the ramus branch still intact (known from prior cath), SVG-PDA from original surgery TO, SVG-PDA from redo surgery with long 50-60% mid-graft stenosis.  No target for intervention.  ?5. Severe aortic stenosis: Bioprosthetic AVR in 2007.  ?6. Atrial fibrillation: Paroxysmal. Initially noted after cardiac surgery, then again after AAA repair.  DCCV to NSR 06/06/14.  DCCV to NSR again 08/21/14.  ?- DCCV to NSR in 1/19.  ?- DCCV to NSR in 6/20.  ?- Now on Tikosyn.  ?- DCCV to NSR 12/21.  ?-  DCCV to NSR 2/22 ?- DCCV to NSR 3/22 ?7. Type II diabetes  ?8. Gout  ?9. GERD  ?10. Hyperlipidemia  ?11. HTN ?12. GERD  ?13. Chronic systolic CHF: Echo (1/84) with EF worsened to 25-30% with grade II diastolic dysfunction, stable bioprosthetic aortic valve, mildly dilated RV with normal systolic function.  Echo (1/16) with EF 55-60%, basal inferior hypokinesis, mild LVH, bioprosthetic aortic valve with mean gradient 13 mmHg, mild MR, severe LAE.  ?- Echo (1/19): EF 45-50%, mild MR, normal bioprosthetic aortic valve.  ?- TEE (6/20): EF 50% with mild diffuse hypokinesis, mildly decreased RV systolic function with mild RV enlargement, bioprosthetic aortic valve looked ok, mild MR. ?- Echo (1/22):  EF 45-50%, diffuse hypokinesis, mildly decreased RV systolic function, moderate biatrial enlargement, bioprosthetic valve with mean gradient 8 mmHg.   ?- TEE (2/22): EF 45-50%, RV normal, moderate biatrial enlargement, bioprosthetic AoV with mean gradient 6 mmHg,  mild MR.  ?14. BPH  ?15. Lung nodules: Followed by pulmonary.  ?16. Fall with rhabdomyolysis (10/22)  ? ?SH: Widower, 2 children, raised his 3 grand-daughters, lives in [place], [place].  Occasional ETOH,

## 2022-03-02 NOTE — H&P (View-Only) (Signed)
Patient ID: Nathaniel Bady., male   DOB: October 03, 1932, 88 y.o.   MRN: [id number] ?PCP: Dr. Elyse Hsu ?Cardiology: Dr. Aundra Dubin ? ?88 y.o. with history of extensive vascular disease including CAD s/p CABG and redo CABG, AAA s/p repair, PAD, and carotid stenosis as well as prior AVR presents for followup of CAD and atrial fibrillation.   He has had documentation of significant peripheral arterial disease.  He gets bilateral calf soreness after walking for about 15 minutes.  This resolves with rest, no rest pain.  No pedal ulcers, though when he gets cuts on his feet, they heal slowly.  Peripheral arterial dopplers done in 10/14 showed > 50% focal SFA stenoses bilaterally.  He has carotid artery disease.  No stroke-like symptoms.  He had a endovascular AAA repair in 5/15.   ? ?After his AAA repair, he was noted to be in atrial fibrillation.  He had had a prior episode of atrial fibrillation after his cardiac surgery in 2007.  Atrial fibrillation was rate-controlled.  He was started on warfarin and Toprol XL.  He developed exertional dyspnea and fatigue in atrial fibrillation. I cardioverted him back to NSR on 06/06/14.  I also had him get an echocardiogram in 6/15.  This showed EF worsened to 25-30% with stable bioprosthetic aortic valve, mildly dilated RV with normal systolic function.  Given the fall in EF, I took him for [facility] in 7/15.  This showed stable anatomy.  The LIMA-LAD was patent, the sequential SVG-OM/ramus/D was patent only to ramus but this is the same as the prior cath, and the SVG-PDA was patent with a long 50-60% mid-graft stenosis.  No intervention.  Of note, at cath he was noted to be back in atrial fibrillation.  I then started him on amiodarone.  I took Mr Sax for [place] again on 08/21/14.  This was successful.  He has been off amiodarone due to side effects (hallucinations).  Echo in 1/16 showed recovery of EF to 55-60%.   ? ?He was admitted in 1/19 with bronchitis/wheezing/dyspnea and was found to be in  atrial fibrillation with RVR.  He tested positive for metapneumovirus.  Echo showed EF 45-50% with normal bioprosthetic aortic valve. He was diuresed for volume overload while in the hospital. He had DCCV to NSR.   ? ?Carotid dopplers in 1/19 showed 80-99% RICA stenosis, he had right CEA in 9/47 with no complications.  ? ?At appointment in early 6/20, Mr Denis was noted to be back in atrial fibrillation with RVR and mild CHF.  He was started on Toprol XL and Lasix was increased.  He had TEE-guided DCCV to NSR in 6/20.  TEE showed EF 50%, mild diffuse hypokinesis, mildly decreased RV systolic function, normally functioning bioprosthetic aortic valve.  ? ?In 2021, he was admitted for Tikosyn initiation.  In 12/21, he went into atrial fibrillation again and was cardioverted back to NSR.  ? ?Echo in 1/22 showed EF 45-50%, diffuse hypokinesis, mildly decreased RV systolic function, moderate biatrial enlargement, bioprosthetic valve with mean gradient 8 mmHg.   ? ?In 2/22, he went into atrial fibrillation and had TEE-guided DCCV back to NSR.  TEE showed EF 45-50%.   ? ?In 3/22, he was back in the ER with atrial fibrillation triggered by [name] most likely.  He had a cardioversion back to NSR.  ? ?In 10/22, patient developed urinary retention and an E coli UTI.  In the setting of this, he had a fall at home and was unable to  get up due to weakness for hours.  He developed rhabdomyolysis.  This resolved with minimal renal damage with IV fluid hydration and he was discharged to rehab.  ? ?Follow up 2/23 he was in atrial fibrillation, TEE/DCCV arranged however INR subtherapeutic. He cancelled rescheduled procedure due to a death in the family. ? ?Today he returns for HF follow up. Overall feeling fine. He is at [facility] independent living and is eating more frequently. He is SOB walking further distances on flat ground. He does not weigh at facility but feels he has more fluid in legs, also with a wound to left leg  that is not healing. Denies palpitations, abnormal bleeding, CP, dizziness, or PND/Orthopnea. Appetite ok. No fever or chills. Taking all medications. He did not increase his Lasix as instructed las visit. ? ?ECG: Atrial fibrillation,  QTc 494 (personally reviewed) ? ?Labs (5/15): K 4.3, creatinine 0.89 ?Labs (6/15): K 4.3, creatinine 1.4, BNP 112 ?Labs (7/15): K 4.4, creatinine 1.2, HCT 40.2 ?Labs (8/15): K 4, creatinine 1.2, BNP 113, LFTs and TSH normal ?Labs (10/15): K 4.4, creatinine 1.4 ?Labs (5/16): K 4.7, creatinine 1.18, HDL 52, LDL 98 ?Labs (6/16): LDL 118, LFTs normal ?Labs (1/17): HCT 40.2, K 3.7, creatinine 1.04 ?Labs (1/19): K 4.9, creatinine 1.19 ?Labs (2/19): K 4.2, creatinine 0.89 ?Labs (3/19): LDL 86, HDL 45, TGs 245 ?Labs (5/19): LDL 110, HDL 39 ?Labs (6/19): K 4.1, creatinine 1.07 ?Labs (11/19): LDL 14, HDL 56 ?Labs (6/20): K 4.1, creatinine 1.0 ?Labs (7/20): K 4.3, creatinine 1.07 ?Labs (9/21): LDL 129, hgb 14.8, K 4.1, creatinine 1.02 ?Labs (12/21): LDL 20, HDL 46, K 4.4, creatinine 1.23 ?Labs (1/22): LDL 45, HDL 49, K 4.3, creatinine 1.1, BNP 184 ?Labs (3/22): K 4.4, creatinine 1.10, LDL 15, HDL 49 ?Labs (6/22): K 4.3, creatinine 1.11, LDL 121 ?Labs (7/22): K 4.3, creatinine 1.23, hgb 14.5 ?Labs (11/22): K 4.5, creatinine 1.14 ?Labs (2/23): K 4.3, creatinine 1.16 ? ?PMH: ?1. PAD: Peripheral arterial dopplers in 2012 showed > 75% bilateral SFA stenosis. Peripheral arterial dopplers in 10/14 showed > 50% focal bilateral SFA stenoses.  ?2. AAA: [place] 4/15 with 4.4 cm AAA but concern for penetrating ulcers. CTA abdomen showed 4.4 cm AAA with penetrating ulcers and possible pseudoaneurysms. Now s/p endovascular repair of AAA in 4/09 with no complications.  ?3. Carotid stenosis: Carotid dopplers (4/15) with 60-79% bilateral ICA stenosis. Carotid dopplers (11/15) with 60-79% bilateral ICA stenosis.  Carotid dopplers 6/16 with 60-79% bilateral ICA stenosis. ?- Carotid dopplers (1/18): 60-79% RICA  stenosis.  ?- Carotid dopplers (1/19): 80-99% RICA stenosis => Right CEA in 2/19.  ?- Carotid dopplers (11/19): 40-59% bilateral stenosis.  ?- Carotid dopplers (1/21): 1-39% BICA stenosis.  ?4. CAD: CABG 1989 with LIMA-LAD, SVG-D, seq SVG-ramus and OM, sequential SVG-PDA/PLV. SVG-PDA/PLV found to be occluded on cath prior to AVR, so patient had SVG-PDA with AVR in 2007. LHC (7/15) with LIMA-LAD patent with 40-50% stenosis in LAD after touchdown, sequential SVG-ramus/OM/diagonal with only the ramus branch still intact (known from prior cath), SVG-PDA from original surgery TO, SVG-PDA from redo surgery with long 50-60% mid-graft stenosis.  No target for intervention.  ?5. Severe aortic stenosis: Bioprosthetic AVR in 2007.  ?6. Atrial fibrillation: Paroxysmal. Initially noted after cardiac surgery, then again after AAA repair.  DCCV to NSR 06/06/14.  DCCV to NSR again 08/21/14.  ?- DCCV to NSR in 1/19.  ?- DCCV to NSR in 6/20.  ?- Now on Tikosyn.  ?- DCCV to NSR 12/21.  ?-  DCCV to NSR 2/22 ?- DCCV to NSR 3/22 ?7. Type II diabetes  ?8. Gout  ?9. GERD  ?10. Hyperlipidemia  ?11. HTN ?12. GERD  ?13. Chronic systolic CHF: Echo (4/93) with EF worsened to 25-30% with grade II diastolic dysfunction, stable bioprosthetic aortic valve, mildly dilated RV with normal systolic function.  Echo (1/16) with EF 55-60%, basal inferior hypokinesis, mild LVH, bioprosthetic aortic valve with mean gradient 13 mmHg, mild MR, severe LAE.  ?- Echo (1/19): EF 45-50%, mild MR, normal bioprosthetic aortic valve.  ?- TEE (6/20): EF 50% with mild diffuse hypokinesis, mildly decreased RV systolic function with mild RV enlargement, bioprosthetic aortic valve looked ok, mild MR. ?- Echo (1/22):  EF 45-50%, diffuse hypokinesis, mildly decreased RV systolic function, moderate biatrial enlargement, bioprosthetic valve with mean gradient 8 mmHg.   ?- TEE (2/22): EF 45-50%, RV normal, moderate biatrial enlargement, bioprosthetic AoV with mean gradient 6 mmHg,  mild MR.  ?14. BPH  ?15. Lung nodules: Followed by pulmonary.  ?16. Fall with rhabdomyolysis (10/22)  ? ?SH: Widower, 2 children, raised his 3 grand-daughters, lives in [place], [place].  Occasional ETOH,

## 2022-03-05 ENCOUNTER — Other Ambulatory Visit: Payer: Medicare Other

## 2022-03-06 ENCOUNTER — Ambulatory Visit (HOSPITAL_COMMUNITY)
Admission: RE | Admit: 2022-03-06 | Discharge: 2022-03-06 | Disposition: A | Discharge status: Home / Self Care | Payer: Medicare Other | Source: Ambulatory Visit | Attending: Family Medicine | Admitting: Family Medicine

## 2022-03-06 ENCOUNTER — Other Ambulatory Visit: Payer: Self-pay

## 2022-03-06 ENCOUNTER — Telehealth: Payer: Self-pay

## 2022-03-06 ENCOUNTER — Other Ambulatory Visit (HOSPITAL_COMMUNITY): Payer: Self-pay | Admitting: *Deleted

## 2022-03-06 ENCOUNTER — Encounter (HOSPITAL_COMMUNITY): Payer: Self-pay

## 2022-03-06 VITALS — BP 96/54 | HR 98 | Wt 201.1 lb

## 2022-03-06 DIAGNOSIS — S81802A Unspecified open wound, left lower leg, initial encounter: Secondary | ICD-10-CM | POA: Insufficient documentation

## 2022-03-06 DIAGNOSIS — I480 Paroxysmal atrial fibrillation: Secondary | ICD-10-CM | POA: Diagnosis not present

## 2022-03-06 DIAGNOSIS — E1151 Type 2 diabetes mellitus with diabetic peripheral angiopathy without gangrene: Secondary | ICD-10-CM | POA: Insufficient documentation

## 2022-03-06 DIAGNOSIS — I5032 Chronic diastolic (congestive) heart failure: Secondary | ICD-10-CM

## 2022-03-06 DIAGNOSIS — I110 Hypertensive heart disease with heart failure: Secondary | ICD-10-CM | POA: Diagnosis not present

## 2022-03-06 DIAGNOSIS — X58XXXA Exposure to other specified factors, initial encounter: Secondary | ICD-10-CM | POA: Diagnosis not present

## 2022-03-06 DIAGNOSIS — Z7901 Long term (current) use of anticoagulants: Secondary | ICD-10-CM | POA: Diagnosis not present

## 2022-03-06 DIAGNOSIS — I714 Abdominal aortic aneurysm, without rupture, unspecified: Secondary | ICD-10-CM

## 2022-03-06 DIAGNOSIS — Z09 Encounter for follow-up examination after completed treatment for conditions other than malignant neoplasm: Secondary | ICD-10-CM | POA: Diagnosis not present

## 2022-03-06 DIAGNOSIS — Z79899 Other long term (current) drug therapy: Secondary | ICD-10-CM | POA: Insufficient documentation

## 2022-03-06 DIAGNOSIS — I10 Essential (primary) hypertension: Secondary | ICD-10-CM | POA: Diagnosis not present

## 2022-03-06 DIAGNOSIS — R0602 Shortness of breath: Secondary | ICD-10-CM | POA: Diagnosis not present

## 2022-03-06 DIAGNOSIS — I5042 Chronic combined systolic (congestive) and diastolic (congestive) heart failure: Secondary | ICD-10-CM | POA: Diagnosis not present

## 2022-03-06 DIAGNOSIS — I4819 Other persistent atrial fibrillation: Secondary | ICD-10-CM

## 2022-03-06 DIAGNOSIS — I779 Disorder of arteries and arterioles, unspecified: Secondary | ICD-10-CM | POA: Diagnosis not present

## 2022-03-06 DIAGNOSIS — I2510 Atherosclerotic heart disease of native coronary artery without angina pectoris: Secondary | ICD-10-CM | POA: Diagnosis not present

## 2022-03-06 DIAGNOSIS — R5383 Other fatigue: Secondary | ICD-10-CM | POA: Insufficient documentation

## 2022-03-06 DIAGNOSIS — Z951 Presence of aortocoronary bypass graft: Secondary | ICD-10-CM | POA: Insufficient documentation

## 2022-03-06 DIAGNOSIS — G4733 Obstructive sleep apnea (adult) (pediatric): Secondary | ICD-10-CM | POA: Diagnosis not present

## 2022-03-06 DIAGNOSIS — I4891 Unspecified atrial fibrillation: Secondary | ICD-10-CM

## 2022-03-06 DIAGNOSIS — E785 Hyperlipidemia, unspecified: Secondary | ICD-10-CM | POA: Insufficient documentation

## 2022-03-06 DIAGNOSIS — I739 Peripheral vascular disease, unspecified: Secondary | ICD-10-CM | POA: Insufficient documentation

## 2022-03-06 DIAGNOSIS — Z953 Presence of xenogenic heart valve: Secondary | ICD-10-CM | POA: Diagnosis not present

## 2022-03-06 DIAGNOSIS — E119 Type 2 diabetes mellitus without complications: Secondary | ICD-10-CM

## 2022-03-06 DIAGNOSIS — Z8774 Personal history of (corrected) congenital malformations of heart and circulatory system: Secondary | ICD-10-CM | POA: Insufficient documentation

## 2022-03-06 LAB — PROTIME-INR
INR: 2.3 — ABNORMAL HIGH (ref 0.8–1.2)
Prothrombin Time: 25.5 seconds — ABNORMAL HIGH (ref 11.4–15.2)

## 2022-03-06 LAB — BASIC METABOLIC PANEL
Anion gap: 6 (ref 5–15)
BUN: 23 mg/dL (ref 8–23)
CO2: 27 mmol/L (ref 22–32)
Calcium: 8.8 mg/dL — ABNORMAL LOW (ref 8.9–10.3)
Chloride: 103 mmol/L (ref 98–111)
Creatinine, Ser: 1.24 mg/dL (ref 0.61–1.24)
GFR, Estimated: 56 mL/min — ABNORMAL LOW (ref >60)
Glucose, Bld: 205 mg/dL — ABNORMAL HIGH (ref 70–99)
Potassium: 4.1 mmol/L (ref 3.5–5.1)
Sodium: 136 mmol/L (ref 135–145)

## 2022-03-06 LAB — BRAIN NATRIURETIC PEPTIDE: B Natriuretic Peptide: 172 pg/mL — ABNORMAL HIGH (ref 0.0–100.0)

## 2022-03-06 MED ORDER — FUROSEMIDE 20 MG PO TABS: 40.0000 mg | ORAL_TABLET | Freq: Two times a day (BID) | ORAL | 6 refills | Status: DC

## 2022-03-06 NOTE — Addendum Note (Signed)
Encounter addended by: Scarlette Calico, RN on: 03/06/2022 2:24 PM ? Actions taken: Visit diagnoses modified, Order list changed, Diagnosis association updated

## 2022-03-06 NOTE — Patient Instructions (Signed)
Increase Furosemide to 60 mg (3 tabs) in AM and 40 mg (2 tabs) in PM FOR 3 DAYS ONLY, then take 40 mg (2 tabs) Twice daily  ? ?Labs done today, we will call you for abnormal results ? ?Your physician has requested that you have a TEE/Cardioversion. During a TEE, sound waves are used to create images of your heart. It provides your doctor with information about the size and shape of your heart and how well your heart?s chambers and valves are working. In this test, a transducer is attached to the end of a flexible tube that is guided down you throat and into your esophagus (the tube leading from your mouth to your stomach) to get a more detailed image of your heart. Once the TEE has determined that a blood clot is not present, the cardioversion begins. Electrical Cardioversion uses a jolt of electricity to your heart either through paddles or wired patches attached to your chest. This is a controlled, usually prescheduled, procedure. This procedure is done at the hospital and you are not awake during the procedure. You usually go home the day of the procedure. Please see the instruction sheet given to you today for more information. SEE INSTRUCTIONS BELOW ? ?You have been referred to the [facility], they will call you for an appointment ? ?Your physician recommends that you schedule a follow-up appointment in: 2-3 weeks after your cardioversion ? ?If you have any questions or concerns before your next appointment please send [place] a message through [place] or call our office at [phone].   ? ?TO LEAVE A MESSAGE FOR THE NURSE SELECT OPTION 2, PLEASE LEAVE A MESSAGE INCLUDING: ?YOUR NAME ?DATE OF BIRTH ?CALL BACK NUMBER ?REASON FOR CALL**this is important as we prioritize the call backs ? ?YOU WILL RECEIVE A CALL BACK THE SAME DAY AS LONG AS YOU CALL BEFORE 4:00 PM ? ?At the [facility], you and your health needs are our priority. As part of our continuing mission to provide you with exceptional  heart care, we have created designated Provider Care Teams. These Care Teams include your primary Cardiologist (physician) and Advanced Practice Providers (APPs- Physician Assistants and Nurse Practitioners) who all work together to provide you with the care you need, when you need it.  ? ?You may see any of the following providers on your designated Care Team at your next follow up: ?Dr Glori Bickers ?Dr [name] ?Darrick Grinder, NP ?Lyda Jester, PA ?Jessica Milford,NP ?Marlyce Huge, PA ?Audry Riles, PharmD ? ? ?Please be sure to bring in all your medications bottles to every appointment.  ? ? ? ?CARDIOVERSION INSTRUCTIONS: ? ?You are scheduled for a TEE Cardioversion on Thursday 03/19/22 with Dr. Aundra Dubin.   ? ?Please arrive at the [facility] (Main Entrance A) at [facility]: [address] [place], [place] [address] at 11:30 am ? ?DIET: Nothing to eat or drink after midnight except a sip of water with medications (see medication instructions below) ? ? ?Medication Instructions: ?Hold FUROSEMIDE Thursday 4/6 AM ? ?Continue your anticoagulant: COUMADIN, please do not miss any doses ? ? ?You must have a responsible person to drive you home and stay in the waiting area during your procedure. Failure to do so could result in cancellation. ? ?Interior and spatial designer cards. ? ?*Special Note: Every effort is made to have your procedure done on time. Occasionally there are emergencies that occur at the hospital that may cause delays. Please be patient if a delay  does occur.  ? ? ? ?

## 2022-03-06 NOTE — Telephone Encounter (Addendum)
Received message below:  ? ?Schub, Marsh Dolly, RN  P Cv Div [facility] Anticoag ?Hey guys, we have him sch for a TEE/DCCV on 4/6 at 1 pm with Dr Aundra Dubin, if you can please check INR that AM prior to him arriving at hospital that would be great, he is supposed to be at hospital at 11:30 that morning, thanks so much.  ? ?Pt has scheduled anticoagulation appt on 03/10/22. Will place note on appt to schedule his following anticoagulation appt on 03/19/22 - the day of TEE/DCCV.  ?

## 2022-03-10 ENCOUNTER — Other Ambulatory Visit: Payer: Self-pay

## 2022-03-10 ENCOUNTER — Ambulatory Visit (INDEPENDENT_AMBULATORY_CARE_PROVIDER_SITE_OTHER): Payer: Medicare Other

## 2022-03-10 ENCOUNTER — Ambulatory Visit
Admission: RE | Admit: 2022-03-10 | Discharge: 2022-03-10 | Disposition: A | Discharge status: Home / Self Care | Payer: Medicare Other | Source: Ambulatory Visit | Attending: Family Medicine | Admitting: Family Medicine

## 2022-03-10 DIAGNOSIS — I4891 Unspecified atrial fibrillation: Secondary | ICD-10-CM

## 2022-03-10 DIAGNOSIS — Z5181 Encounter for therapeutic drug level monitoring: Secondary | ICD-10-CM | POA: Diagnosis not present

## 2022-03-10 DIAGNOSIS — R911 Solitary pulmonary nodule: Secondary | ICD-10-CM | POA: Diagnosis not present

## 2022-03-10 DIAGNOSIS — I700 Atherosclerosis of aorta: Secondary | ICD-10-CM | POA: Diagnosis not present

## 2022-03-10 DIAGNOSIS — J9811 Atelectasis: Secondary | ICD-10-CM | POA: Diagnosis not present

## 2022-03-10 LAB — POCT INR: INR: 1.7 — AB (ref 2.0–3.0)

## 2022-03-10 NOTE — Patient Instructions (Addendum)
Description   ?Take 4 tablets today, then start taking Warfarin 3 tablets daily. Recheck INR in 1 week. [facility] [phone].  ?  ?  ?  ?

## 2022-03-11 ENCOUNTER — Other Ambulatory Visit: Payer: Self-pay | Admitting: Family Medicine

## 2022-03-11 ENCOUNTER — Encounter (HOSPITAL_COMMUNITY): Payer: Self-pay | Admitting: Cardiology

## 2022-03-11 DIAGNOSIS — R911 Solitary pulmonary nodule: Secondary | ICD-10-CM

## 2022-03-19 ENCOUNTER — Encounter: Payer: Self-pay | Admitting: *Deleted

## 2022-03-19 ENCOUNTER — Other Ambulatory Visit: Payer: Self-pay | Admitting: *Deleted

## 2022-03-19 ENCOUNTER — Other Ambulatory Visit: Payer: Self-pay

## 2022-03-19 ENCOUNTER — Ambulatory Visit (HOSPITAL_BASED_OUTPATIENT_CLINIC_OR_DEPARTMENT_OTHER): Payer: Self-pay | Admitting: Anesthesiology

## 2022-03-19 ENCOUNTER — Ambulatory Visit (HOSPITAL_COMMUNITY)
Admission: RE | Admit: 2022-03-19 | Discharge: 2022-03-19 | Disposition: A | Discharge status: Home / Self Care | Payer: Self-pay | Attending: Cardiology | Admitting: Cardiology

## 2022-03-19 ENCOUNTER — Other Ambulatory Visit: Payer: Self-pay | Admitting: Radiation Oncology

## 2022-03-19 ENCOUNTER — Ambulatory Visit (HOSPITAL_BASED_OUTPATIENT_CLINIC_OR_DEPARTMENT_OTHER)
Admission: RE | Admit: 2022-03-19 | Discharge: 2022-03-19 | Disposition: A | Discharge status: Home / Self Care | Payer: Self-pay | Source: Ambulatory Visit | Attending: Cardiology | Admitting: Cardiology

## 2022-03-19 ENCOUNTER — Encounter (HOSPITAL_COMMUNITY)
Admission: RE | Disposition: A | Discharge status: Home / Self Care | Payer: Self-pay | Source: Home / Self Care | Attending: Cardiology

## 2022-03-19 ENCOUNTER — Ambulatory Visit (INDEPENDENT_AMBULATORY_CARE_PROVIDER_SITE_OTHER): Payer: Self-pay

## 2022-03-19 ENCOUNTER — Ambulatory Visit (HOSPITAL_COMMUNITY): Payer: Self-pay | Admitting: Anesthesiology

## 2022-03-19 DIAGNOSIS — R911 Solitary pulmonary nodule: Secondary | ICD-10-CM

## 2022-03-19 DIAGNOSIS — Z7901 Long term (current) use of anticoagulants: Secondary | ICD-10-CM | POA: Insufficient documentation

## 2022-03-19 DIAGNOSIS — I110 Hypertensive heart disease with heart failure: Secondary | ICD-10-CM

## 2022-03-19 DIAGNOSIS — I480 Paroxysmal atrial fibrillation: Secondary | ICD-10-CM | POA: Insufficient documentation

## 2022-03-19 DIAGNOSIS — Z5181 Encounter for therapeutic drug level monitoring: Secondary | ICD-10-CM

## 2022-03-19 DIAGNOSIS — E1151 Type 2 diabetes mellitus with diabetic peripheral angiopathy without gangrene: Secondary | ICD-10-CM | POA: Insufficient documentation

## 2022-03-19 DIAGNOSIS — I2510 Atherosclerotic heart disease of native coronary artery without angina pectoris: Secondary | ICD-10-CM

## 2022-03-19 DIAGNOSIS — G4733 Obstructive sleep apnea (adult) (pediatric): Secondary | ICD-10-CM | POA: Insufficient documentation

## 2022-03-19 DIAGNOSIS — I4891 Unspecified atrial fibrillation: Secondary | ICD-10-CM

## 2022-03-19 DIAGNOSIS — Z953 Presence of xenogenic heart valve: Secondary | ICD-10-CM | POA: Insufficient documentation

## 2022-03-19 DIAGNOSIS — I081 Rheumatic disorders of both mitral and tricuspid valves: Secondary | ICD-10-CM

## 2022-03-19 DIAGNOSIS — Z951 Presence of aortocoronary bypass graft: Secondary | ICD-10-CM | POA: Insufficient documentation

## 2022-03-19 DIAGNOSIS — I5042 Chronic combined systolic (congestive) and diastolic (congestive) heart failure: Secondary | ICD-10-CM | POA: Insufficient documentation

## 2022-03-19 DIAGNOSIS — E785 Hyperlipidemia, unspecified: Secondary | ICD-10-CM | POA: Insufficient documentation

## 2022-03-19 DIAGNOSIS — Z79899 Other long term (current) drug therapy: Secondary | ICD-10-CM | POA: Insufficient documentation

## 2022-03-19 DIAGNOSIS — I6523 Occlusion and stenosis of bilateral carotid arteries: Secondary | ICD-10-CM | POA: Insufficient documentation

## 2022-03-19 DIAGNOSIS — I340 Nonrheumatic mitral (valve) insufficiency: Secondary | ICD-10-CM

## 2022-03-19 HISTORY — PX: CARDIOVERSION: SHX1299

## 2022-03-19 HISTORY — PX: TEE WITHOUT CARDIOVERSION: SHX5443

## 2022-03-19 LAB — POCT I-STAT, CHEM 8
BUN: 31 mg/dL — ABNORMAL HIGH (ref 8–23)
Calcium, Ion: 0.99 mmol/L — ABNORMAL LOW (ref 1.15–1.40)
Chloride: 107 mmol/L (ref 98–111)
Creatinine, Ser: 1.1 mg/dL (ref 0.61–1.24)
Glucose, Bld: 129 mg/dL — ABNORMAL HIGH (ref 70–99)
HCT: 41 % (ref 39.0–52.0)
Hemoglobin: 13.9 g/dL (ref 13.0–17.0)
Potassium: 4.6 mmol/L (ref 3.5–5.1)
Sodium: 138 mmol/L (ref 135–145)
TCO2: 26 mmol/L (ref 22–32)

## 2022-03-19 LAB — POCT INR: INR: 2.7 (ref 2.0–3.0)

## 2022-03-19 SURGERY — TRANSESOPHAGEAL ECHOCARDIOGRAM (TEE)
Anesthesia: Monitor Anesthesia Care

## 2022-03-19 MED ORDER — SODIUM CHLORIDE 0.9 % IV SOLN
INTRAVENOUS | Status: AC | PRN
  Administered 2022-03-19: 500 mL via INTRAMUSCULAR

## 2022-03-19 MED ORDER — EPHEDRINE SULFATE-NACL 50-0.9 MG/10ML-% IV SOSY
PREFILLED_SYRINGE | INTRAVENOUS | Status: DC | PRN
  Administered 2022-03-19: 15 mg via INTRAVENOUS

## 2022-03-19 MED ORDER — LIDOCAINE 2% (20 MG/ML) 5 ML SYRINGE
INTRAMUSCULAR | Status: DC | PRN
  Administered 2022-03-19: 40 mg via INTRAVENOUS

## 2022-03-19 MED ORDER — PROPOFOL 500 MG/50ML IV EMUL
INTRAVENOUS | Status: DC | PRN
  Administered 2022-03-19: 125 ug/kg/min via INTRAVENOUS

## 2022-03-19 MED ORDER — PROPOFOL 10 MG/ML IV BOLUS
INTRAVENOUS | Status: DC | PRN
  Administered 2022-03-19 (×3): 20 mg via INTRAVENOUS

## 2022-03-19 MED ORDER — PHENYLEPHRINE 40 MCG/ML (10ML) SYRINGE FOR IV PUSH (FOR BLOOD PRESSURE SUPPORT)
PREFILLED_SYRINGE | INTRAVENOUS | Status: DC | PRN
Start: 2022-03-19 — End: 2022-03-19
  Administered 2022-03-19: 80 ug via INTRAVENOUS

## 2022-03-19 NOTE — Transfer of Care (Signed)
Immediate Anesthesia Transfer of Care Note ? ?Patient: Nathaniel [name]. ? ?Procedure(s) Performed: TRANSESOPHAGEAL ECHOCARDIOGRAM (TEE) ?CARDIOVERSION ? ?Patient Location: PACU and Endoscopy Unit ? ?Anesthesia Type:General ? ?Level of Consciousness: drowsy, patient cooperative and responds to stimulation ? ?Airway & Oxygen Therapy: Patient Spontanous Breathing ? ?Post-op Assessment: Report given to RN and Post -op Vital signs reviewed and stable ? ?Post vital signs: Reviewed and stable ? ?Last Vitals:  ?Vitals Value Taken Time  ?BP 124/81 03/19/22 1324  ?Temp    ?Pulse 70 03/19/22 1325  ?Resp 19 03/19/22 1325  ?SpO2 94 % 03/19/22 1325  ?Vitals shown include unvalidated device data. ? ?Last Pain:  ?Vitals:  ? 03/19/22 1117  ?TempSrc: Oral  ?PainSc: 0-No pain  ?   ? ?  ? ?Complications: No notable events documented. ?

## 2022-03-19 NOTE — Procedures (Signed)
Electrical Cardioversion Procedure Note ?Nathaniel [name]. ?[id number] ?September 22, 1932  ? ?Procedure: Electrical Cardioversion ?Indications:  Atrial Fibrillation ? ?Procedure Details ?Consent: Risks of procedure as well as the alternatives and risks of each were explained to the (patient/caregiver).  Consent for procedure obtained. ?Time Out: Verified patient identification, verified procedure, site/side was marked, verified correct patient position, special equipment/implants available, medications/allergies/relevent history reviewed, required imaging and test results available.  Performed ? ?Patient placed on cardiac monitor, pulse oximetry, supplemental oxygen as necessary.  ?Sedation given:  Propofol per anesthesiology ?Pacer pads placed anterior and posterior chest. ? ?Cardioverted 1 time(s).  ?Cardioverted at 200J. ? ?Evaluation ?Findings: Post procedure EKG shows: NSR ?Complications: None ?Patient did tolerate procedure well. ? ? ?[name] ?03/19/2022, 1:24 PM ? ? ? ?

## 2022-03-19 NOTE — Interval H&P Note (Signed)
History and Physical Interval Note: ? ?03/19/2022 ?12:57 PM ? ?Nathaniel [name].  has presented today for surgery, with the diagnosis of AFIB.  The various methods of treatment have been discussed with the patient and family. After consideration of risks, benefits and other options for treatment, the patient has consented to  Procedure(s): ?TRANSESOPHAGEAL ECHOCARDIOGRAM (TEE) (N/A) ?CARDIOVERSION (N/A) as a surgical intervention.  The patient's history has been reviewed, patient examined, no change in status, stable for surgery.  I have reviewed the patient's chart and labs.  Questions were answered to the patient's satisfaction.   ? ? ?Nathaniel [name] ? ? ?

## 2022-03-19 NOTE — Discharge Instructions (Signed)
TEE ? ?YOU HAD AN CARDIAC PROCEDURE TODAY: Refer to the procedure report and other information in the discharge instructions given to you for any specific questions about what was found during the examination. If this information does not answer your questions, please call [place] office at [phone] to clarify.  ? ?DIET: Your first meal following the procedure should be a light meal and then it is ok to progress to your normal diet. A half-sandwich or bowl of soup is an example of a good first meal. Heavy or fried foods are harder to digest and may make you feel nauseous or bloated. Drink plenty of fluids but you should avoid alcoholic beverages for 24 hours. If you had a esophageal dilation, please see attached instructions for diet.  ? ?ACTIVITY: Your care partner should take you home directly after the procedure. You should plan to take it easy, moving slowly for the rest of the day. You can resume normal activity the day after the procedure however YOU SHOULD NOT DRIVE, use power tools, machinery or perform tasks that involve climbing or major physical exertion for 24 hours (because of the sedation medicines used during the test).  ? ?SYMPTOMS TO REPORT IMMEDIATELY: ?A cardiologist can be reached at any hour. Please call [phone] for any of the following symptoms:  ?Vomiting of blood or coffee ground material  ?New, significant abdominal pain  ?New, significant chest pain or pain under the shoulder blades  ?Painful or persistently difficult swallowing  ?New shortness of breath  ?Black, tarry-looking or red, bloody stools ? ?FOLLOW UP:  ?Please also call with any specific questions about appointments or follow up tests. ? Electrical Cardioversion ? ?Electrical cardioversion is the delivery of a jolt of electricity to restore a normal rhythm to the heart. A rhythm that is too fast or is not regular keeps the heart from pumping well. In this procedure, sticky patches or metal paddles are placed on  the chest to deliver electricity to the heart from a device.  If this information does not answer your questions, please call [place] office at [phone] to clarify.  ? ?Follow these instructions at home: ?You may have some redness on the skin where the shocks were given.  You may apply over-the-counter hydrocortisone cream or aloe vera to alleviate skin irritation. ?YOU SHOULD NOT DRIVE, use power tools, machinery or perform tasks that involve climbing or major physical exertion for 24 hours (because of the sedation medicines used during the test).  ?Take over-the-counter and prescription medicines only as told by your health care provider. ?Ask your health care provider how to check your pulse. Check it often. ?Rest for 48 hours after the procedure or as told by your health care provider. ?Avoid or limit your caffeine use as told by your health care provider. ?Keep all follow-up visits as told by your health care provider. This is important. ? ?FOLLOW UP:  ?Please also call with any specific questions about appointments or follow up tests.   ?

## 2022-03-19 NOTE — Anesthesia Preprocedure Evaluation (Signed)
Anesthesia Evaluation  ?Patient identified by MRN, date of birth, ID band ?Patient awake ? ? ? ?Reviewed: ?Allergy & Precautions, H&P , NPO status , Patient's Chart, lab work & pertinent test results, reviewed documented beta blocker date and time  ? ?Airway ?Mallampati: II ? ?TM Distance: >3 FB ?Neck ROM: Full ? ? ? Dental ?no notable dental hx. ?(+) Teeth Intact, Dental Advisory Given ?  ?Pulmonary ?neg pulmonary ROS, former smoker,  ?  ?Pulmonary exam normal ?breath sounds clear to auscultation ? ? ? ? ? ? Cardiovascular ?hypertension, Pt. on medications and Pt. on home beta blockers ?+ CAD, + Peripheral Vascular Disease and +CHF  ?+ dysrhythmias Atrial Fibrillation  ?Rhythm:Irregular Rate:Normal ? ? ?  ?Neuro/Psych ?negative neurological ROS ? negative psych ROS  ? GI/Hepatic ?Neg liver ROS, GERD  ,  ?Endo/Other  ?diabetes, Type 2 ? Renal/GU ?negative Renal ROS  ?negative genitourinary ?  ?Musculoskeletal ? ? Abdominal ?  ?Peds ? Hematology ?negative hematology ROS ?(+)   ?Anesthesia Other Findings ? ? Reproductive/Obstetrics ?negative OB ROS ? ?  ? ? ? ? ? ? ? ? ? ? ? ? ? ?  ?  ? ? ? ? ? ? ? ? ?Anesthesia Physical ? ?Anesthesia Plan ? ?ASA: III ? ?Anesthesia Plan: MAC  ? ?Post-op Pain Management:   ? ?Induction: Intravenous ? ?PONV Risk Score and Plan: 1 and Propofol infusion and Treatment may vary due to age or medical condition ? ?Airway Management Planned: Nasal Cannula ? ?Additional Equipment:  ? ?Intra-op Plan:  ? ?Post-operative Plan:  ? ?Informed Consent: I have reviewed the patients History and Physical, chart, labs and discussed the procedure including the risks, benefits and alternatives for the proposed anesthesia with the patient or authorized representative who has indicated his/her understanding and acceptance.  ? ? ? ?Dental advisory given ? ?Plan Discussed with: CRNA ? ?Anesthesia Plan Comments:   ? ? ? ? ? ? ?Anesthesia Quick Evaluation ? ?

## 2022-03-19 NOTE — Progress Notes (Signed)
The proposed treatment discussed in cancer conference is for discussion purpose only and is not a binding recommendation. The patient was not physically examined nor present for their treatment options. Therefore, final treatment plans cannot be decided.  ?

## 2022-03-19 NOTE — Patient Instructions (Addendum)
Description   ?Resume taking Warfarin 3 tablets daily except 2 tablets on Fridays.  ?Recheck INR 1 week post /[facility]. [facility] [phone].  ?  ?   ?

## 2022-03-19 NOTE — Progress Notes (Signed)
I updated Dr. Yong Channel on cancer conference recommendations.  ?

## 2022-03-19 NOTE — Anesthesia Procedure Notes (Signed)
Procedure Name: [place] ?Date/Time: 03/19/2022 1:03 PM ?Performed by: Janace Litten, CRNA ?Pre-anesthesia Checklist: Patient identified, Emergency Drugs available, Suction available and Patient being monitored ?Patient Re-evaluated:Patient Re-evaluated prior to induction ?Oxygen Delivery Method: Nasal cannula ? ? ? ? ?

## 2022-03-19 NOTE — Progress Notes (Signed)
?  Echocardiogram ?Echocardiogram Transesophageal has been performed. ? ?Nathaniel [name] ?03/19/2022, 1:42 PM ?

## 2022-03-19 NOTE — CV Procedure (Signed)
Procedure: TEE ? ?Sedation: Per anesthesiology ? ?Indication: Atrial fibrillation.  ? ?Findings: Please see echo section for full report.  Normal LV size with mild LV hypertrophy, EF 50-55%.  Normal RV size with mildly decreased systolic function.  Moderate left atrial enlargement, no LA appendage thrombus. Moderate right atrial enlargement.  No PFO/ASD by color doppler.  Mild tricuspid regurgitation, peak RV-RA gradient 19 mmHg.  Bioprosthetic aortic valve with mean gradient 6 mmHg, no significant stenosis or regurgitation.  Mild mitral regurgitation.  The posterior leaflet of the mitral valve is calcified and restricted, mild MS (mean gradient 3 mmHg).  Normal caliber thoracic aorta with grade 3 plaque in the descending thoracic aorta.  ? ?May proceed to DCCV.  ? ?[name] ?03/19/2022 ?1:24 PM ? ?

## 2022-03-19 NOTE — Progress Notes (Signed)
Oncology Nurse Navigator Documentation ? ? ?  03/19/2022  ? 10:00 AM  ?Oncology Nurse Navigator Flowsheets  ?Navigator Location [facility]  ?Barriers/Navigation Needs Coordination of Care/Nathaniel [name]'s case was discussed at cancer conference today.  Recommendations are to get PET scan and then to see radiation oncology for possible SBRT.   ?Interventions Coordination of Care  ?Acuity Level 2-Minimal Needs (1-2 Barriers Identified)  ?Time Spent with Patient 30  ?  ?

## 2022-03-20 ENCOUNTER — Encounter: Payer: Self-pay | Admitting: *Deleted

## 2022-03-20 ENCOUNTER — Encounter (HOSPITAL_COMMUNITY): Payer: Self-pay | Admitting: Cardiology

## 2022-03-20 NOTE — Progress Notes (Signed)
Oncology Nurse Navigator Documentation ? ? ?  03/20/2022  ?  2:00 PM 03/19/2022  ? 10:00 AM  ?Oncology Nurse Navigator Flowsheets  ?Navigator Follow Up Date: 03/23/2022   ?Navigator Follow Up Reason: Patient Call   ?Navigator Location [facility]  ?Referral Date to RadOnc/MedOnc 03/19/2022   ?Navigator Encounter Type Telephone   ?Telephone Outgoing Call   ?Barriers/Navigation Needs Coordination of Care;Education/I notified Dr. Yong Channel of cancer conference recommendations and he would like Dr. Tammi Klippel with rad onc to see patient. Dr. Tammi Klippel ordered pet scan.  I called radiology and get this scheduled. I called patient to update but was unable to reach him nor leave a vm message. I called his son and left a vm message to call me with my name and phone number.  I also notified Dr. Tammi Klippel of the pet scan appt and to get clarification on when he would like to see Nathaniel [name].  Coordination of Care  ?Education Other   ?Interventions Coordination of Care;Education Coordination of Care  ?Acuity Level 2-Minimal Needs (1-2 Barriers Identified) Level 2-Minimal Needs (1-2 Barriers Identified)  ?Coordination of Care Appts   ?Education Method Verbal   ?Time Spent with Patient 45 30  ?  ?

## 2022-03-20 NOTE — Anesthesia Postprocedure Evaluation (Signed)
Anesthesia Post Note ? ?Patient: Nathaniel [name]. ? ?Procedure(s) Performed: TRANSESOPHAGEAL ECHOCARDIOGRAM (TEE) ?CARDIOVERSION ? ?  ? ?Patient location during evaluation: PACU ?Anesthesia Type: MAC ?Level of consciousness: awake and alert ?Pain management: pain level controlled ?Vital Signs Assessment: post-procedure vital signs reviewed and stable ?Respiratory status: spontaneous breathing, nonlabored ventilation and respiratory function stable ?Cardiovascular status: blood pressure returned to baseline and stable ?Postop Assessment: no apparent nausea or vomiting ?Anesthetic complications: no ? ? ?No notable events documented. ? ?Last Vitals:  ?Vitals:  ? 03/19/22 1334 03/19/22 1352  ?BP: (!) 118/45 (!) 126/56  ?Pulse: 69 68  ?Resp: (!) 22 19  ?Temp:    ?SpO2: 96% 99%  ?  ?Last Pain:  ?Vitals:  ? 03/19/22 1324  ?TempSrc: Temporal  ?PainSc: 0-No pain  ? ?Pain Goal:   ? ?  ?  ?  ?  ?  ?  ?  ? ?Lynda Rainwater ? ? ? ? ?

## 2022-03-20 NOTE — Progress Notes (Signed)
Oncology Nurse Navigator Documentation ? ? ?  03/20/2022  ?  3:00 PM 03/20/2022  ?  2:00 PM 03/19/2022  ? 10:00 AM  ?Oncology Nurse Navigator Flowsheets  ?Navigator Follow Up Date:  03/23/2022   ?Navigator Follow Up Reason:  Patient Call   ?Navigator Location [facility]  ?Referral Date to RadOnc/MedOnc  03/19/2022   ?Navigator Encounter Type Telephone Telephone   ?Telephone Incoming Call Outgoing Call   ?Barriers/Navigation Needs Education/I received a call back from patients son. I explained to him what was discussed at cancer conference and the plan of care.  He states he will get in touch with his dad about everything we discussed and pet scan and pre-procedure instructions.  I gave him my name and phone number to call with any questions.  I will update dr Tammi Klippel and dr Retail banker.  Coordination of Care;Education Coordination of Care  ?Education Other Other   ?Interventions  Coordination of Care;Education Coordination of Care  ?Acuity Level 2-Minimal Needs (1-2 Barriers Identified) Level 2-Minimal Needs (1-2 Barriers Identified) Level 2-Minimal Needs (1-2 Barriers Identified)  ?Coordination of Care  Appts   ?Education Method Verbal Verbal   ?Time Spent with Patient 30 45 30  ?  ?

## 2022-03-23 ENCOUNTER — Encounter: Payer: Self-pay | Admitting: *Deleted

## 2022-03-23 NOTE — Progress Notes (Signed)
Oncology Nurse Navigator Documentation ? ? ?  03/23/2022  ? 12:00 PM 03/20/2022  ?  3:00 PM 03/20/2022  ?  2:00 PM 03/19/2022  ? 10:00 AM  ?Oncology Nurse Navigator Flowsheets  ?Navigator Follow Up Date: 03/31/2022  03/23/2022   ?Navigator Follow Up Reason: Radiology  Patient Call   ?Navigator Location [facility]  ?Referral Date to RadOnc/MedOnc   03/19/2022   ?Navigator Encounter Type Appt/Treatment Plan Review Telephone Telephone   ?Telephone  Incoming Call Outgoing Call   ?Treatment Phase Abnormal Scans     ?Barriers/Navigation Needs Coordination of Care/I followed up on Mr. Tanguma schedule. He is set up for pet scan and rad onc next week.  Will follow up on scan and Dr. Johny Shears note.  Education Coordination of Care;Education Coordination of Care  ?Education  Other Other   ?Interventions Coordination of Care  Coordination of Care;Education Coordination of Care  ?Acuity Level 2-Minimal Needs (1-2 Barriers Identified) Level 2-Minimal Needs (1-2 Barriers Identified) Level 2-Minimal Needs (1-2 Barriers Identified) Level 2-Minimal Needs (1-2 Barriers Identified)  ?Coordination of Care Other  Appts   ?Education Method  Verbal Verbal   ?Time Spent with Patient [phone]  ?  ?

## 2022-03-25 ENCOUNTER — Telehealth: Payer: Self-pay

## 2022-03-25 NOTE — Telephone Encounter (Signed)
-----   Message from Shirley Friar, PA-C sent at 03/17/2022  1:25 PM EDT ----- ?Regarding: FW: Patient taking spironolactone incorrectly ?Please confirm patient should take spiro 12.5 mg daily , will need BMET end of this or early next week please! ? ?----- Message ----- ?From: Isidor Holts, RPH ?Sent: 03/17/2022   1:09 PM EDT ?To: Shirley Friar, PA-C ?Subject: Patient taking spironolactone incorrectly     ? ?Hello, ? ?Upon doing a medication history for Mr. Boettner for an upcoming procedure we discovered he was taking spironolactone differently than prescribed: 25mg  daily rather than 12.5mg  daily. Here is the note from the pharmacy tech: ? ?"Pt has written on his medicine list to take Spironolactone 25 mg qd (our records reflect the same instructions). Last pharmacy records show to take 12.5 mg daily. Pt confirmed he has been taking 25 mg qd and that his bottle specifies12.5 mg qd instructions. After today pt intends to start taking 25 mg every other day. I instructed patient to contact his dr to confirm what he should be doing." ? ?My investigation revealed this: ? ?"On 10/23/21 the patient was instructed over the phone by a CMA to hold spironolactone for two days then resume at one-half tablet daily due to hyperkalemia of 5.3 and the prescription was changed to 25mg , one-half tablet daily. On 01/28/22 a CMA documented the patient was taking it differently than prescribed(which is highlighted yellow on the PTA med list): 25mg , 1 tablet daily. It remained that way until the med history done 03/17/22. I don't see any documentation that a provider noticed this and clarified for the patient." ? ?Please follow-up with the patient, ? ?Romeo Rabon  ?Medication History Program - Coordinator ? ? ?

## 2022-03-25 NOTE — Telephone Encounter (Signed)
I spoke with pt and he stated that he was told to take a whole tablet 25mg  every other day and that's what he's been doing. I advised him that we want him to take 12.5mg  everyday. He will start taking it that way. He is scheduled to come on 4/13 for a Coumadin check and I told him we would get labs (BMET) that day to check his kidney function.  ?He agreed to do this.  ?

## 2022-03-26 ENCOUNTER — Ambulatory Visit (INDEPENDENT_AMBULATORY_CARE_PROVIDER_SITE_OTHER): Payer: Self-pay

## 2022-03-26 DIAGNOSIS — I4891 Unspecified atrial fibrillation: Secondary | ICD-10-CM

## 2022-03-26 DIAGNOSIS — Z5181 Encounter for therapeutic drug level monitoring: Secondary | ICD-10-CM

## 2022-03-26 LAB — POCT INR: INR: 1.7 — AB (ref 2.0–3.0)

## 2022-03-26 NOTE — Patient Instructions (Signed)
Description   ?Take 3.5 tablets today and 3 tablets tomorrow and then resume taking Warfarin 3 tablets daily except 2 tablets on Fridays.  ?Recheck INR in 2 weeks.  ?Coumadin Clinic [phone].  ?  ?   ?

## 2022-03-27 ENCOUNTER — Other Ambulatory Visit (HOSPITAL_COMMUNITY): Payer: Self-pay

## 2022-03-27 MED ORDER — RAMIPRIL 2.5 MG PO CAPS
2.5000 mg | ORAL_CAPSULE | Freq: Two times a day (BID) | ORAL | 4 refills | Status: DC
Start: 2022-03-27 — End: 2022-08-24

## 2022-03-30 ENCOUNTER — Ambulatory Visit (HOSPITAL_COMMUNITY)
Admission: RE | Admit: 2022-03-30 | Discharge: 2022-03-30 | Disposition: A | Discharge status: Home / Self Care | Payer: Self-pay | Source: Ambulatory Visit | Attending: Radiation Oncology | Admitting: Radiation Oncology

## 2022-03-30 DIAGNOSIS — R911 Solitary pulmonary nodule: Secondary | ICD-10-CM | POA: Insufficient documentation

## 2022-03-30 LAB — ECHO TEE
AV Mean grad: 5 mmHg
AV Peak grad: 8.2 mmHg
Ao pk vel: 1.43 m/s

## 2022-03-30 LAB — GLUCOSE, CAPILLARY: Glucose-Capillary: 117 mg/dL — ABNORMAL HIGH (ref 70–99)

## 2022-03-30 MED ORDER — FLUDEOXYGLUCOSE F - 18 (FDG) INJECTION
10.0300 | Freq: Once | INTRAVENOUS | Status: AC | PRN
  Administered 2022-03-30: 10.03 via INTRAVENOUS

## 2022-03-31 NOTE — Progress Notes (Signed)
Thoracic Location of Tumor / Histology: Lung Nodule ? ?CT Scan: stability at 2 years. (01/05/2018) ?COMPARISON:   ?FINDINGS:  (03/10/2022)  ? ?Cardiovascular: Atherosclerotic calcifications aorta, coronary arteries, and proximal great vessels. Mitral annular calcification.  Prior AVR. Post CABG. Aneurysmal dilatation ascending thoracic aorta ?4.0 cm transverse image 67. Enlargement of cardiac chambers. No pericardial effusion. ? ?Mediastinum/Nodes: Esophagus unremarkable. Scattered normal sized mediastinal lymph nodes. Base of cervical region normal appearance. ?No thoracic adenopathy. ?  ?Lungs/Pleura: Interval increase in size of a slightly irregular opacity at the RIGHT apex 14 x 11 mm, previously 6 mm diameter, cannot exclude developing neoplasm. Minimal central cavitation within lesion. Central peribronchial thickening. Bibasilar ?atelectasis. Remaining lungs clear. No pulmonary infiltrate, pleural effusion, or pneumothorax. ?  ?Upper Abdomen: Visualized upper abdomen unremarkable ?  ?Musculoskeletal: No acute osseous findings. ?  ?IMPRESSION: ?Interval increase in size of a slightly irregular opacity at the RIGHT apex 14 x 11 mm, previously 6 mm diameter, concerning for developing pulmonary neoplasm; recommend referral to multi ?disciplinary thoracic clinic for management. ?Bibasilar atelectasis and bronchitic changes. ?Extensive atherosclerotic calcifications including coronary arteries with prior AVR and CABG. ?Aneurysmal dilatation of ascending thoracic aorta 4.0 cm transverse.  Aortic Atherosclerosis (ICD10-I70.0). ?  ?03/30/2022 NM PET:   ? IMPRESSION: ? ?1. Low level metabolic activity within the irregular pulmonary nodule in the right lung apex, nonspecific and possibly reflecting sequela of an infectious/inflammatory process. However, given the ?low-level metabolic activity and its interval increase in size from 2019 to 2023 a low-grade pulmonary adenocarcinoma is a differential consideration.  Recommend attention on follow-up dedicated chest CT ?to assess stability. ?  ?2. No other evidence of suspicious hypermetabolic disease in the neck, chest, abdomen or pelvis. ? ?Tobacco/Marijuana/Snuff/ETOH use: Former smoker (03/23/1992).  No drug or smokeless tobacco use and yes to alcohol use. ? ?Past/Anticipated interventions by cardiothoracic surgery, if any:  ?Past/Anticipated interventions by medical oncology, if any: NA ? ?Signs/Symptoms ?Weight changes, if any: Notice 5 lb weight loss over past week. ?Respiratory complaints, if any:  SOB with exertion ?Hemoptysis, if any: No ?Pain issues, if any:  No ? ?SAFETY ISSUES: ?Prior radiation? No ?Pacemaker/ICD?  No ?Possible current pregnancy? Male ?Is the patient on methotrexate? No ? ?Current Complaints / other details:    ?

## 2022-04-01 ENCOUNTER — Encounter: Payer: Self-pay | Admitting: *Deleted

## 2022-04-01 ENCOUNTER — Ambulatory Visit
Admission: RE | Admit: 2022-04-01 | Discharge: 2022-04-01 | Disposition: A | Discharge status: Home / Self Care | Payer: Self-pay | Source: Ambulatory Visit | Attending: Radiation Oncology | Admitting: Radiation Oncology

## 2022-04-01 ENCOUNTER — Other Ambulatory Visit: Payer: Self-pay

## 2022-04-01 VITALS — BP 174/67 | HR 82 | Temp 97.0°F | Resp 18 | Ht 70.0 in | Wt 192.2 lb

## 2022-04-01 DIAGNOSIS — I714 Abdominal aortic aneurysm, without rupture, unspecified: Secondary | ICD-10-CM | POA: Insufficient documentation

## 2022-04-01 DIAGNOSIS — I700 Atherosclerosis of aorta: Secondary | ICD-10-CM | POA: Insufficient documentation

## 2022-04-01 DIAGNOSIS — E1122 Type 2 diabetes mellitus with diabetic chronic kidney disease: Secondary | ICD-10-CM | POA: Insufficient documentation

## 2022-04-01 DIAGNOSIS — I2510 Atherosclerotic heart disease of native coronary artery without angina pectoris: Secondary | ICD-10-CM | POA: Insufficient documentation

## 2022-04-01 DIAGNOSIS — Z7901 Long term (current) use of anticoagulants: Secondary | ICD-10-CM | POA: Insufficient documentation

## 2022-04-01 DIAGNOSIS — Z87891 Personal history of nicotine dependence: Secondary | ICD-10-CM | POA: Insufficient documentation

## 2022-04-01 DIAGNOSIS — I509 Heart failure, unspecified: Secondary | ICD-10-CM | POA: Insufficient documentation

## 2022-04-01 DIAGNOSIS — C3411 Malignant neoplasm of upper lobe, right bronchus or lung: Secondary | ICD-10-CM

## 2022-04-01 DIAGNOSIS — Z79899 Other long term (current) drug therapy: Secondary | ICD-10-CM | POA: Insufficient documentation

## 2022-04-01 DIAGNOSIS — M109 Gout, unspecified: Secondary | ICD-10-CM | POA: Insufficient documentation

## 2022-04-01 DIAGNOSIS — I7121 Aneurysm of the ascending aorta, without rupture: Secondary | ICD-10-CM | POA: Insufficient documentation

## 2022-04-01 DIAGNOSIS — I081 Rheumatic disorders of both mitral and tricuspid valves: Secondary | ICD-10-CM | POA: Insufficient documentation

## 2022-04-01 DIAGNOSIS — N323 Diverticulum of bladder: Secondary | ICD-10-CM | POA: Insufficient documentation

## 2022-04-01 DIAGNOSIS — I4891 Unspecified atrial fibrillation: Secondary | ICD-10-CM | POA: Insufficient documentation

## 2022-04-01 DIAGNOSIS — E785 Hyperlipidemia, unspecified: Secondary | ICD-10-CM | POA: Insufficient documentation

## 2022-04-01 DIAGNOSIS — N189 Chronic kidney disease, unspecified: Secondary | ICD-10-CM | POA: Insufficient documentation

## 2022-04-01 DIAGNOSIS — K219 Gastro-esophageal reflux disease without esophagitis: Secondary | ICD-10-CM | POA: Insufficient documentation

## 2022-04-01 DIAGNOSIS — I130 Hypertensive heart and chronic kidney disease with heart failure and stage 1 through stage 4 chronic kidney disease, or unspecified chronic kidney disease: Secondary | ICD-10-CM | POA: Insufficient documentation

## 2022-04-01 DIAGNOSIS — E1136 Type 2 diabetes mellitus with diabetic cataract: Secondary | ICD-10-CM | POA: Insufficient documentation

## 2022-04-01 DIAGNOSIS — R911 Solitary pulmonary nodule: Secondary | ICD-10-CM | POA: Insufficient documentation

## 2022-04-01 NOTE — Progress Notes (Signed)
I followed up with rad onc to see if Nathaniel [name] needs med onc assistance for his tx.  PA notified me that no med onc at this time.   ?

## 2022-04-01 NOTE — Progress Notes (Signed)
?Radiation Oncology         [phone] ?________________________________ ? ?Initial outpatient Consultation ? ?Name: Nathaniel [name]. MRN: [id number]  ?Date of Service: 04/01/2022 DOB: March 16, 1932 ? ?LF:YBOFBP, Brayton Mars, MD  Marin Olp, MD  ? ?REFERRING PHYSICIAN: Marin Olp, MD ? ?DIAGNOSIS: 88 yo gentleman with a putative clinical stage IA2 (13 mm) non-small cell carcinoma of the right upper lung. ? ?  ICD-10-CM   ?1. 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)  C34.11   ?  ? ? ?HISTORY OF PRESENT ILLNESS: Nathaniel [name]. is a 88 y.o. male seen at the request of Dr. Yong Channel.  He has a history of bilateral lung nodules (6 mm RUL apical, 4 mm LLL) incidentally noted on imaging in January 2017 and had remained stable on surveillance imaging through 2019 so the recommendation was for a follow-up scan at 5 years to confirm stability.  On his most recent CT chest from 03/10/2022, there was noted significant increased size of the RUL nodule, now measuring 14 x 11 mm, concerning for a developing pulmonary neoplasm.  His case and imaging were discussed and reviewed in the multidisciplinary thoracic oncology conference and the recommendation was for a PET scan for further evaluation.  The PET scan was performed on 03/30/2022 and shows low-level activity in the RUL nodule measuring 13 x 12 mm with an SUV max of 1.8, suspicious for a low-grade pulmonary adenocarcinoma versus inflammatory/infectious etiology. ? ?He has been referred today to discuss the potential role for radiotherapy in the treatment of his disease. ? ?PREVIOUS RADIATION THERAPY: No ? ?PAST MEDICAL HISTORY:  ?Past Medical History:  ?Diagnosis Date  ? AAA (abdominal aortic aneurysm) ([place])   ? Atrial fibrillation ([place])   ? Atrial fibrillation ([place])   ? CAD (coronary artery disease)   ? Cataract   ? CHF (congestive heart failure) ([place])   ? Chronic kidney disease   ? Colon polyps   ? adenomatous  ? Diabetes mellitus   ? Diverticulosis   ?  Dysrhythmia   ? A-Fib  ? GERD (gastroesophageal reflux disease)   ? Gout   ? Hernia, abdominal   ? History of aortic valve replacement with bioprosthetic valve   ? 2007  ? Hx: UTI (urinary tract infection)   ? Hyperlipemia   ? Hypertension   ? Internal hemorrhoids   ? Peripheral vascular disease ([place])   ? Shortness of breath   ? with exertion  ?   ? ?PAST SURGICAL HISTORY: ?Past Surgical History:  ?Procedure Laterality Date  ? ABDOMINAL AORTIC ENDOVASCULAR STENT GRAFT N/A 04/13/2014  ? Procedure: ABDOMINAL AORTIC ENDOVASCULAR STENT GRAFT;  Surgeon: Serafina Mitchell, MD;  Location: [place];  Service: Vascular;  Laterality: N/A;  ? ANGIOPLASTY    ? Unsure if he had stents put in   ? APPENDECTOMY    ? CARDIAC VALVE SURGERY    ? CARDIOVERSION N/A 06/06/2014  ? Procedure: CARDIOVERSION;  Surgeon: Larey Dresser, MD;  Location: [place];  Service: Cardiovascular;  Laterality: N/A;  ? CARDIOVERSION N/A 08/21/2014  ? Procedure: CARDIOVERSION;  Surgeon: Larey Dresser, MD;  Location: [place];  Service: Cardiovascular;  Laterality: N/A;  ? CARDIOVERSION N/A 12/22/2017  ? Procedure: CARDIOVERSION;  Surgeon: Larey Dresser, MD;  Location: [facility] ENDOSCOPY;  Service: Cardiovascular;  Laterality: N/A;  ? CARDIOVERSION N/A 05/25/2019  ? Procedure: CARDIOVERSION;  Surgeon: Larey Dresser, MD;  Location: [facility] ENDOSCOPY;  Service: Cardiovascular;  Laterality: N/A;  ? CARDIOVERSION N/A 06/14/2019  ? Procedure: CARDIOVERSION;  Surgeon: Jerline Pain, MD;  Location: [facility] ENDOSCOPY;  Service: Cardiovascular;  Laterality: N/A;  ? CARDIOVERSION N/A 12/12/2020  ? Procedure: CARDIOVERSION;  Surgeon: Larey Dresser, MD;  Location: [facility] ENDOSCOPY;  Service: Cardiovascular;  Laterality: N/A;  ? CARDIOVERSION N/A 01/29/2021  ? Procedure: CARDIOVERSION;  Surgeon: Larey Dresser, MD;  Location: [facility] ENDOSCOPY;  Service: Cardiovascular;  Laterality: N/A;  ? CARDIOVERSION N/A 03/19/2022  ? Procedure: CARDIOVERSION;  Surgeon: Larey Dresser, MD;  Location:  [facility] ENDOSCOPY;  Service: Cardiovascular;  Laterality: N/A;  ? CORONARY ARTERY BYPASS GRAFT    ? ENDARTERECTOMY Right 02/02/2018  ? Procedure: ENDARTERECTOMY CAROTID RIGHT;  Surgeon: Serafina Mitchell, MD;  Location: [facility] OR;  Service: Vascular;  Laterality: Right;  ? EYE SURGERY Bilateral   ? cataract surgery  ? HERNIA REPAIR    ? 2 operations  ? LEFT HEART CATHETERIZATION WITH CORONARY/GRAFT ANGIOGRAM N/A 07/10/2014  ? Procedure: LEFT HEART CATHETERIZATION WITH Beatrix Fetters;  Surgeon: Larey Dresser, MD;  Location: [facility] CATH LAB;  Service: Cardiovascular;  Laterality: N/A;  ? TEE WITHOUT CARDIOVERSION N/A 05/25/2019  ? Procedure: TRANSESOPHAGEAL ECHOCARDIOGRAM (TEE);  Surgeon: Larey Dresser, MD;  Location: [facility] ENDOSCOPY;  Service: Cardiovascular;  Laterality: N/A;  ? TEE WITHOUT CARDIOVERSION N/A 01/29/2021  ? Procedure: TRANSESOPHAGEAL ECHOCARDIOGRAM (TEE);  Surgeon: Larey Dresser, MD;  Location[facility] ENDOSCOPY;  Service: Cardiovascular;  Laterality: N/A;  ? TEE WITHOUT CARDIOVERSION N/A 03/19/2022  ? Procedure: TRANSESOPHAGEAL ECHOCARDIOGRAM (TEE);  Surgeon: Larey Dresser, MD;  Location: [facility] ENDOSCOPY;  Service: Cardiovascular;  Laterality: N/A;  ? TONSILLECTOMY    ? VIDEO BRONCHOSCOPY Bilateral 12/25/2015  ? Procedure: VIDEO BRONCHOSCOPY WITHOUT FLUORO;  Surgeon: Javier Glazier, MD;  Location: [place];  Service: Cardiopulmonary;  Laterality: Bilateral;  ? ? ?FAMILY HISTORY:  ?Family History  ?Problem Relation Age of Onset  ? Heart disease Mother   ? Heart disease Father   ?     before age 14  ? AAA (abdominal aortic aneurysm) Father   ? Colon cancer Neg Hx   ? ? ?SOCIAL HISTORY: He is a retired from [employer] but has continued working full-time, self-employed, as a Music therapist.  He is widowed and recently moved into [facility] independent living and is in the process of selling his home.  He lost his wife at age 77, to ovarian cancer. ?Social History  ? ?Socioeconomic History  ? Marital  status: Widowed  ?  Spouse name: Not on file  ? Number of children: Not on file  ? Years of education: Not on file  ? Highest education level: Not on file  ?Occupational History  ? 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 Smoking status: Former  ?  Packs/day: 0.25  ?  Years: 20.00  ?  Pack years: 5.00  ?  Types: Cigarettes  ?  Quit date: 03/23/1992  ?  Years since quitting: 30.0  ?  Passive exposure: Past  ? Smokeless tobacco: Never  ?Vaping Use  ? Vaping Use: Never used  ?Substance and Sexual Activity  ? Alcohol use: Yes  ?  Alcohol/week: 5.0 standard drinks  ?  Types: 5 Glasses of wine per week  ? Drug use: No  ? Sexual activity: Not Currently  ?Other Topics Concern  ? Not on file  ?Social History Narrative  ? Widowed (lives alone at [place])- wife died at 72 (cancer at 9). Daughter 61,  son 15 in 2023. 5 grandkids. 5 greatgrandkids.   ? -when patient were 26- started caring for 3 of his grandkids when daughter was not in a good place in the airforce.   ?   ? 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   ?   ? Hobbies: golf, enjoys football and basketball at [employer]  ? ?Social Determinants of Health  ? ?Financial Resource Strain: Low Risk   ? Difficulty of Paying Living Expenses: Not very hard  ?Food Insecurity: No Food Insecurity  ? Worried About Charity fundraiser in the Last Year: Never true  ? Ran Out of Food in the Last Year: Never true  ?Transportation Needs: No Transportation Needs  ? Lack of Transportation (Medical): No  ? Lack of Transportation (Non-Medical): No  ?Physical Activity: Not on file  ?Stress: Not on file  ?Social Connections: Not on file  ?Intimate Partner Violence: Not on file  ? ? ?ALLERGIES: Grass extracts [gramineae pollens], Milk-related compounds, and Amiodarone ? ?MEDICATIONS:  ?Current Outpatient Medications  ?Medication Sig Dispense Refill  ? allopurinol (ZYLOPRIM) 300 MG tablet Take 1 tablet (300 mg total) by mouth daily. 90 tablet 3  ? atorvastatin (LIPITOR) 10 MG tablet  Take 10 mg by mouth 3 (three) times a week. Take with 20 mg dose to equal 30 mg 3 days weekly    ? atorvastatin (LIPITOR) 20 MG tablet Take 1 tab daily every other day ALTERNATING with 1.5 tabs daily every o

## 2022-04-02 ENCOUNTER — Encounter: Payer: Self-pay | Admitting: *Deleted

## 2022-04-02 NOTE — Progress Notes (Signed)
Oncology Nurse Navigator Documentation ? ? ?  04/02/2022  ? 11:00 AM 03/23/2022  ? 12:00 PM 03/20/2022  ?  3:00 PM 03/20/2022  ?  2:00 PM 03/19/2022  ? 10:00 AM  ?Oncology Nurse Navigator Flowsheets  ?Abnormal Finding Date 03/10/2022      ?Diagnosis Status Additional Work Up      ?Navigator Follow Up Date: 04/06/2022 03/31/2022  03/23/2022   ?Navigator Follow Up Reason: Review Note Radiology  Patient Call   ?Navigator Location [facility]  ?Referral Date to RadOnc/MedOnc    03/19/2022   ?Navigator Encounter Type Appt/Treatment Plan Review Appt/Treatment Plan Review Telephone Telephone   ?Telephone   Incoming Call Outgoing Call   ?Treatment Phase Abnormal Scans Abnormal Scans     ?Barriers/Navigation Needs Coordination of Care Coordination of Care Education Coordination of Care;Education Coordination of Care  ?Education   Other Other   ?Interventions Coordination of Care/I followed up on Mr. Dunn plan of care. He saw Rad Onc and is set up with Dr. Valeta Harms for tissue dx on /24 Coordination of Care  Coordination of Care;Education Coordination of Care  ?Acuity Level 2-Minimal Needs (1-2 Barriers Identified) Level 2-Minimal Needs (1-2 Barriers Identified) Level 2-Minimal Needs (1-2 Barriers Identified) Level 2-Minimal Needs (1-2 Barriers Identified) Level 2-Minimal Needs (1-2 Barriers Identified)  ?Coordination of Care Other Other  Appts   ?Education Method   Verbal Verbal   ?Time Spent with Patient 30 30 30  45 30  ?  ?

## 2022-04-05 DIAGNOSIS — Z87891 Personal history of nicotine dependence: Secondary | ICD-10-CM | POA: Diagnosis not present

## 2022-04-05 DIAGNOSIS — C3411 Malignant neoplasm of upper lobe, right bronchus or lung: Secondary | ICD-10-CM | POA: Diagnosis not present

## 2022-04-06 ENCOUNTER — Ambulatory Visit (INDEPENDENT_AMBULATORY_CARE_PROVIDER_SITE_OTHER): Payer: Self-pay | Admitting: Pulmonary Disease

## 2022-04-06 ENCOUNTER — Encounter: Payer: Self-pay | Admitting: Pulmonary Disease

## 2022-04-06 VITALS — BP 118/64 | HR 64 | Temp 98.0°F | Ht 67.0 in | Wt 190.0 lb

## 2022-04-06 DIAGNOSIS — Z87891 Personal history of nicotine dependence: Secondary | ICD-10-CM

## 2022-04-06 DIAGNOSIS — R942 Abnormal results of pulmonary function studies: Secondary | ICD-10-CM

## 2022-04-06 DIAGNOSIS — R911 Solitary pulmonary nodule: Secondary | ICD-10-CM

## 2022-04-06 NOTE — Patient Instructions (Signed)
Thank you for visiting Dr. Valeta Harms at [facility] Pul[facility]. ?Today we recommend the following: ? ?I agree with you getting SBRT treatments with Dr. Tammi Klippel.  ? ?Return if symptoms worsen or fail to improve. See me if needed.  ? ? ? ?Please do your part to reduce the spread of COVID-19.  ?

## 2022-04-06 NOTE — Progress Notes (Signed)
? ?Synopsis: Referred in April 2023 for lung nodule by Marin Olp, MD ? ?Subjective:  ? ?PATIENT ID: Nathaniel [name]. GENDER: male DOB: 03-08-1932, MRN: [id number] ? ?Chief Complaint  ?Patient presents with  ? Consult  ?  Consult for a lung nodule. Pt cant remember what side. Pt states he has SOB at time but nothing to severe.   ? ? ?This is an 88 year old gentleman, past medical history of AAA, atrial fibrillation, aortic valve replacement on Coumadin, history of hypertension, hyperlipidemia.  I spoke with Dr. Tammi Klippel about his case and reviewed his CT imaging together as well as nuclear medicine pet imaging he has a right upper lobe pulmonary nodule concerning for primary bronchogenic carcinoma.  Due to his other medical morbidities we talked about the pros and cons about whether or not he should undergo tissue biopsy and consideration of robotic assisted navigational bronchoscopy and tissue sampling versus empiric treatment with SBRT.  Today in the office we had the same discussion with the patient and answered all of his questions.  Patient is a retired Chief Technology Officer. ? ? ? ? ?Past Medical History:  ?Diagnosis Date  ? AAA (abdominal aortic aneurysm) ([place])   ? Atrial fibrillation ([place])   ? Atrial fibrillation ([place])   ? CAD (coronary artery disease)   ? Cataract   ? CHF (congestive heart failure) ([place])   ? Chronic kidney disease   ? Colon polyps   ? adenomatous  ? Diabetes mellitus   ? Diverticulosis   ? Dysrhythmia   ? A-Fib  ? GERD (gastroesophageal reflux disease)   ? Gout   ? Hernia, abdominal   ? History of aortic valve replacement with bioprosthetic valve   ? 2007  ? Hx: UTI (urinary tract infection)   ? Hyperlipemia   ? Hypertension   ? Internal hemorrhoids   ? Peripheral vascular disease ([place])   ? Shortness of breath   ? with exertion  ?  ? ?Family History  ?Problem Relation Age of Onset  ? Heart disease Mother   ? Heart disease Father   ?     before age 73  ? AAA (abdominal aortic aneurysm) Father   ?  Colon cancer Neg Hx   ?  ? ?Past Surgical History:  ?Procedure Laterality Date  ? ABDOMINAL AORTIC ENDOVASCULAR STENT GRAFT N/A 04/13/2014  ? Procedure: ABDOMINAL AORTIC ENDOVASCULAR STENT GRAFT;  Surgeon: Serafina Mitchell, MD;  Location: [place];  Service: Vascular;  Laterality: N/A;  ? ANGIOPLASTY    ? Unsure if he had stents put in   ? APPENDECTOMY    ? CARDIAC VALVE SURGERY    ? CARDIOVERSION N/A 06/06/2014  ? Procedure: CARDIOVERSION;  Surgeon: Larey Dresser, MD;  Location: [place];  Service: Cardiovascular;  Laterality: N/A;  ? CARDIOVERSION N/A 08/21/2014  ? Procedure: CARDIOVERSION;  Surgeon: Larey Dresser, MD;  Location: [place];  Service: Cardiovascular;  Laterality: N/A;  ? CARDIOVERSION N/A 12/22/2017  ? Procedure: CARDIOVERSION;  Surgeon: Larey Dresser, MD;  Location: [facility] ENDOSCOPY;  Service: Cardiovascular;  Laterality: N/A;  ? CARDIOVERSION N/A 05/25/2019  ? Procedure: CARDIOVERSION;  Surgeon: Larey Dresser, MD;  Location: [facility] ENDOSCOPY;  Service: Cardiovascular;  Laterality: N/A;  ? CARDIOVERSION N/A 06/14/2019  ? Procedure: CARDIOVERSION;  Surgeon: Jerline Pain, MD;  Location: [facility] ENDOSCOPY;  Service: Cardiovascular;  Laterality: N/A;  ? CARDIOVERSION N/A 12/12/2020  ? Procedure: CARDIOVERSION;  Surgeon: Larey Dresser, MD;  Location: [facility] ENDOSCOPY;  Service:  Cardiovascular;  Laterality: N/A;  ? CARDIOVERSION N/A 01/29/2021  ? Procedure: CARDIOVERSION;  Surgeon: Larey Dresser, MD;  Location: [facility] ENDOSCOPY;  Service: Cardiovascular;  Laterality: N/A;  ? CARDIOVERSION N/A 03/19/2022  ? Procedure: CARDIOVERSION;  Surgeon: Larey Dresser, MD;  Location: [facility] ENDOSCOPY;  Service: Cardiovascular;  Laterality: N/A;  ? CORONARY ARTERY BYPASS GRAFT    ? ENDARTERECTOMY Right 02/02/2018  ? Procedure: ENDARTERECTOMY CAROTID RIGHT;  Surgeon: Serafina Mitchell, MD;  Location: [facility] OR;  Service: Vascular;  Laterality: Right;  ? EYE SURGERY Bilateral   ? cataract surgery  ? HERNIA REPAIR    ? 2 operations  ? LEFT  HEART CATHETERIZATION WITH CORONARY/GRAFT ANGIOGRAM N/A 07/10/2014  ? Procedure: LEFT HEART CATHETERIZATION WITH Beatrix Fetters;  Surgeon: Larey Dresser, MD;  Location: [facility] CATH LAB;  Service: Cardiovascular;  Laterality: N/A;  ? TEE WITHOUT CARDIOVERSION N/A 05/25/2019  ? Procedure: TRANSESOPHAGEAL ECHOCARDIOGRAM (TEE);  Surgeon: Larey Dresser, MD;  Location: [facility] ENDOSCOPY;  Service: Cardiovascular;  Laterality: N/A;  ? TEE WITHOUT CARDIOVERSION N/A 01/29/2021  ? Procedure: TRANSESOPHAGEAL ECHOCARDIOGRAM (TEE);  Surgeon: Larey Dresser, MD;  Location: [facility] ENDOSCOPY;  Service: Cardiovascular;  Laterality: N/A;  ? TEE WITHOUT CARDIOVERSION N/A 03/19/2022  ? Procedure: TRANSESOPHAGEAL ECHOCARDIOGRAM (TEE);  Surgeon: Larey Dresser, MD;  Location: [facility] ENDOSCOPY;  Service: Cardiovascular;  Laterality: N/A;  ? TONSILLECTOMY    ? VIDEO BRONCHOSCOPY Bilateral 12/25/2015  ? Procedure: VIDEO BRONCHOSCOPY WITHOUT FLUORO;  Surgeon: Javier Glazier, MD;  Location: [place];  Service: Cardiopulmonary;  Laterality: Bilateral;  ? ? ?Social History  ? ?Socioeconomic History  ? Marital status: Widowed  ?  Spouse name: Not on file  ? Number of children: Not on file  ? Years of education: Not on file  ? Highest education level: Not on file  ?Occupational History  ? 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 Smoking status: Former  ?  Packs/day: 0.25  ?  Years: 20.00  ?  Pack years: 5.00  ?  Types: Cigarettes  ?  Quit date: 03/23/1992  ?  Years since quitting: 30.0  ?  Passive exposure: Past  ? Smokeless tobacco: Never  ?Vaping Use  ? Vaping Use: Never used  ?Substance and Sexual Activity  ? Alcohol use: Yes  ?  Alcohol/week: 5.0 standard drinks  ?  Types: 5 Glasses of wine per week  ? Drug use: No  ? Sexual activity: Not Currently  ?Other Topics Concern  ? Not on file  ?Social History Narrative  ? Widowed (lives alone at [place])- wife died at 58 (cancer at 44). Daughter 58, son 71 in 2023. 5 grandkids. 5  greatgrandkids.   ? -when patient were 52- started caring for 3 of his grandkids when daughter was not in a good place in the airforce.   ?   ? 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   ?   ? Hobbies: golf, enjoys football and basketball at [employer]  ? ?Social Determinants of Health  ? ?Financial Resource Strain: Low Risk   ? Difficulty of Paying Living Expenses: Not very hard  ?Food Insecurity: No Food Insecurity  ? Worried About Charity fundraiser in the Last Year: Never true  ? Ran Out of Food in the Last Year: Never true  ?Transportation Needs: No Transportation Needs  ? Lack of Transportation (Medical): No  ? Lack of Transportation (Non-Medical): No  ?Physical Activity: Not on file  ?Stress: Not on file  ?  Social Connections: Not on file  ?Intimate Partner Violence: Not on file  ?  ? ?Allergies  ?Allergen Reactions  ? Grass Extracts [Gramineae Pollens] Other (See Comments)  ?  Sneezing and runny nose  ? Milk-Related Compounds Other (See Comments)  ?  Just Yogurt.  Makes him very lethargic.  ? Amiodarone Anxiety  ?  depression  ?  ? ?Outpatient Medications Prior to Visit  ?Medication Sig Dispense Refill  ? allopurinol (ZYLOPRIM) 300 MG tablet Take 1 tablet (300 mg total) by mouth daily. 90 tablet 3  ? atorvastatin (LIPITOR) 10 MG tablet Take 10 mg by mouth 3 (three) times a week. Take with 20 mg dose to equal 30 mg 3 days weekly    ? atorvastatin (LIPITOR) 20 MG tablet Take 1 tab daily every other day ALTERNATING with 1.5 tabs daily every other day (Patient taking differently: Take 20 mg by mouth daily.) 135 tablet 3  ? Coenzyme Q10 (CO Q10) 200 MG CAPS Take 200 mg by mouth daily.    ? dofetilide (TIKOSYN) 250 MCG capsule TAKE ONE CAPSULE TWICE A DAY 60 capsule 11  ? dorzolamide-timolol (COSOPT) 22.3-6.8 MG/ML ophthalmic solution Place 1 drop into both eyes 2 (two) times daily.    ? doxazosin (CARDURA) 1 MG tablet Daily after supper (Patient taking differently: Take 1 mg by mouth at bedtime.) 90 tablet 3  ?  doxazosin (CARDURA) 2 MG tablet Take 2 mg by mouth daily.    ? ezetimibe (ZETIA) 10 MG tablet Take 1 tablet (10 mg total) by mouth at bedtime. 90 tablet 3  ? latanoprost (XALATAN) 0.005 % ophthalmic solution

## 2022-04-08 NOTE — Progress Notes (Signed)
Patient ID: Nathaniel [name]., male   DOB: 1932-04-30, 88 y.o.   MRN: [id number] ?PCP: Dr. Elyse Hsu ?Cardiology: Dr. Aundra Dubin ? ?88 y.o. with history of extensive vascular disease including CAD s/p CABG and redo CABG, AAA s/p endovascular repair in 2015, PAD, and carotid stenosis as well as prior AVR.                            ?After his AAA repair, he was noted to be in atrial fibrillation.  He had had a prior episode of atrial fibrillation after his cardiac surgery in 2007.  Atrial fibrillation was rate-controlled.  He was started on warfarin and Toprol XL.  He developed exertional dyspnea and fatigue and was cardioverted back to SR 06/06/14.  Echo 06/15 EF worsened to 25-30% with stable bioprosthetic aortic valve, mildly dilated RV with normal systolic function.  [place] 07/15 showed stable anatomy.  The LIMA-LAD was patent, the sequential SVG-OM/ramus/D was patent only to ramus but this is the same as the prior cath, and the SVG-PDA was patent with a long 50-60% mid-graft stenosis.  No intervention.  Of note, at cath he was noted to be back in atrial fibrillation.  He was started on amiodarone and had DCCV again on 08/21/14.  This was successful.  He has been off amiodarone due to side effects (hallucinations).  Echo in 1/16 showed recovery of EF to 55-60%.   ? ?He was admitted in 1/19 with bronchitis/wheezing/dyspnea and was found to be in atrial fibrillation with RVR.  He tested positive for metapneumovirus.  Echo showed EF 45-50% with normal  bioprosthetic aortic valve. He was diuresed for volume overload while in the hospital. He had DCCV to NSR.   ? ?Carotid dopplers in 1/19 showed 80-99% RICA stenosis, he had right CEA in 1/63 with no complications.  ? ?At appointment in early 6/20, [name] was noted to be back in atrial fibrillation with RVR and mild CHF.  He was started on Toprol XL and Lasix was increased.  He had TEE-guided DCCV to NSR in 6/20.  TEE showed EF 50%, mild diffuse hypokinesis, mildly decreased RV systolic function, normally functioning bioprosthetic aortic valve.  ? ?In 2021, he was admitted for Tikosyn initiation.  In 12/21, he went into atrial fibrillation again and was cardioverted back to NSR.  ? ?Echo in 1/22 showed EF 45-50%, diffuse hypokinesis, mildly decreased RV systolic function, moderate biatrial enlargement, bioprosthetic valve with mean gradient 8 mmHg.   ? ?In 2/22, he went into atrial fibrillation and had TEE-guided DCCV back to NSR.  TEE showed EF 45-50%.   ? ?In 3/22, he was back in the ER with atrial fibrillation triggered by [name] most likely.  He had a cardioversion back to NSR.  ? ?In 10/22, patient developed urinary retention and an E coli UTI.  In the setting of this, he had a fall at home and was unable to get up due to weakness for hours.  He developed rhabdomyolysis.  This resolved with minimal renal damage with IV fluid hydration and he was discharged to rehab.  ? ?Follow up 2/23 he was in atrial fibrillation, TEE/DCCV arranged however INR subtherapeutic. He cancelled rescheduled procedure due to a death in the family. ? ?Seen for f/u 03/06/22. Remained in AF. Volume overloaded. Lasix increased for 3 days. Underwent TEE guided DCCV on 04/06.  ? ?Recently diagnosed with stage IA2 NSCLC RUL. Saw Radiation Oncology and Pulmonary and decided  on empiric treatment with SBRT. ? ?Here today for f/u post cardioversion.  Recently moved into senior living facility. Has been walking more than usual and notes  improvement in activity tolerance. Less dyspnea with exertion. Has some chronic daytime fatigue. No orthopnea or PND. Down 7 lb from last visit. No CP or palpitations. Taking all medications without issue.  ? ?ECG: Sinus brady 57 bpm ? ?Labs (5/15): K 4.3, creatinine 0.89 ?Labs (6/15): K 4.3, creatinine 1.4, BNP 112 ?Labs (7/15): K 4.4, creatinine 1.2, HCT 40.2 ?Labs (8/15): K 4, creatinine 1.2, BNP 113, LFTs and TSH normal ?Labs (10/15): K 4.4, creatinine 1.4 ?Labs (5/16): K 4.7, creatinine 1.18, HDL 52, LDL 98 ?Labs (6/16): LDL 118, LFTs normal ?Labs (1/17): HCT 40.2, K 3.7, creatinine 1.04 ?Labs (1/19): K 4.9, creatinine 1.19 ?Labs (2/19): K 4.2, creatinine 0.89 ?Labs (3/19): LDL 86, HDL 45, TGs 245 ?Labs (5/19): LDL 110, HDL 39 ?Labs (6/19): K 4.1, creatinine 1.07 ?Labs (11/19): LDL 14, HDL 56 ?Labs (6/20): K 4.1, creatinine 1.0 ?Labs (7/20): K 4.3, creatinine 1.07 ?Labs (9/21): LDL 129, hgb 14.8, K 4.1, creatinine 1.02 ?Labs (12/21): LDL 20, HDL 46, K 4.4, creatinine 1.23 ?Labs (1/22): LDL 45, HDL 49, K 4.3, creatinine 1.1, BNP 184 ?Labs (3/22): K 4.4, creatinine 1.10, LDL 15, HDL 49 ?Labs (6/22): K 4.3, creatinine 1.11, LDL 121 ?Labs (7/22): K 4.3, creatinine 1.23, hgb 14.5 ?Labs (11/22): K 4.5, creatinine 1.14 ?Labs (2/23): K 4.3, creatinine 1.16 ? ?PMH: ?1. PAD: Peripheral arterial dopplers in 2012 showed > 75% bilateral SFA stenosis. Peripheral arterial dopplers in 10/14 showed > 50% focal bilateral SFA stenoses.  ?2. AAA: [place] 4/15 with 4.4 cm AAA but concern for penetrating ulcers. CTA abdomen showed 4.4 cm AAA with penetrating ulcers and possible pseudoaneurysms. Now s/p endovascular repair of AAA in 2/67 with no complications.  ?3. Carotid stenosis: Carotid dopplers (4/15) with 60-79% bilateral ICA stenosis. Carotid dopplers (11/15) with 60-79% bilateral ICA stenosis.  Carotid dopplers 6/16 with 60-79% bilateral ICA stenosis. ?- Carotid dopplers (1/18): 60-79% RICA stenosis.  ?- Carotid dopplers (1/19):  80-99% RICA stenosis => Right CEA in 2/19.  ?- Carotid dopplers (11/19): 40-59% bilateral stenosis.  ?- Carotid dopplers (1/21): 1-39% BICA stenosis.  ?4. CAD: CABG 1989 with LIMA-LAD, SVG-D, seq SVG-ramus and OM, sequential SVG-PDA/PLV. SVG-PDA/PLV found to be occluded on cath prior to AVR, so patient had SVG-PDA with AVR in 2007. LHC (7/15) with LIMA-LAD patent with 40-50% stenosis in LAD after touchdown, sequential SVG-ramus/OM/diagonal with only the ramus branch still intact (known from prior cath), SVG-PDA from original surgery TO, SVG-PDA from redo surgery with long 50-60% mid-graft stenosis.  No target for intervention.  ?5. Severe aortic stenosis: Bioprosthetic AVR in 2007.  ?6. Atrial fibrillation: Paroxysmal. Initially noted after cardiac surgery, then again after AAA repair.  DCCV to NSR 06/06/14.  DCCV to NSR again 08/21/14.  ?- DCCV to NSR in 1/19.  ?- DCCV to NSR in 6/20.  ?- Now on Tikosyn.  ?- DCCV to NSR 12/21.  ?- DCCV to NSR 2/22 ?- DCCV to NSR 3/22 ?7. Type II diabetes  ?8. Gout  ?9. GERD  ?10. Hyperlipidemia  ?11. HTN ?12. GERD  ?13. Chronic systolic CHF: Echo (1/24) with EF worsened to 25-30% with grade II diastolic dysfunction, stable bioprosthetic aortic valve, mildly dilated RV with normal systolic function.  Echo (1/16) with EF 55-60%, basal inferior hypokinesis, mild LVH, bioprosthetic aortic valve with mean gradient 13 mmHg, mild MR, severe LAE.  ?-  Echo (1/19): EF 45-50%, mild MR, normal bioprosthetic aortic valve.  ?- TEE (6/20): EF 50% with mild diffuse hypokinesis, mildly decreased RV systolic function with mild RV enlargement, bioprosthetic aortic valve looked ok, mild MR. ?- Echo (1/22):  EF 45-50%, diffuse hypokinesis, mildly decreased RV systolic function, moderate biatrial enlargement, bioprosthetic valve with mean gradient 8 mmHg.   ?- TEE (2/22): EF 45-50%, RV normal, moderate biatrial enlargement, bioprosthetic AoV with mean gradient 6 mmHg, mild MR.  ?14. BPH  ?15. Lung nodules:  Followed by pulmonary.  ?16. Fall with rhabdomyolysis (10/22)  ? ?SH: Widower, 2 children, raised his 3 grand-daughters, lives in [place], [place].  Occasional ETOH, no smoking.  ? ?FH: Father died f

## 2022-04-09 ENCOUNTER — Encounter (HOSPITAL_COMMUNITY): Payer: Self-pay

## 2022-04-09 ENCOUNTER — Ambulatory Visit (HOSPITAL_COMMUNITY)
Admission: RE | Admit: 2022-04-09 | Discharge: 2022-04-09 | Disposition: A | Discharge status: Home / Self Care | Payer: Self-pay | Source: Ambulatory Visit | Attending: Physician Assistant | Admitting: Physician Assistant

## 2022-04-09 ENCOUNTER — Ambulatory Visit (INDEPENDENT_AMBULATORY_CARE_PROVIDER_SITE_OTHER): Payer: Self-pay

## 2022-04-09 VITALS — BP 148/72 | HR 59 | Wt 194.2 lb

## 2022-04-09 DIAGNOSIS — I2510 Atherosclerotic heart disease of native coronary artery without angina pectoris: Secondary | ICD-10-CM | POA: Insufficient documentation

## 2022-04-09 DIAGNOSIS — I110 Hypertensive heart disease with heart failure: Secondary | ICD-10-CM | POA: Insufficient documentation

## 2022-04-09 DIAGNOSIS — Z8774 Personal history of (corrected) congenital malformations of heart and circulatory system: Secondary | ICD-10-CM | POA: Insufficient documentation

## 2022-04-09 DIAGNOSIS — I739 Peripheral vascular disease, unspecified: Secondary | ICD-10-CM

## 2022-04-09 DIAGNOSIS — B9620 Unspecified Escherichia coli [E. coli] as the cause of diseases classified elsewhere: Secondary | ICD-10-CM | POA: Insufficient documentation

## 2022-04-09 DIAGNOSIS — I480 Paroxysmal atrial fibrillation: Secondary | ICD-10-CM | POA: Insufficient documentation

## 2022-04-09 DIAGNOSIS — I6523 Occlusion and stenosis of bilateral carotid arteries: Secondary | ICD-10-CM

## 2022-04-09 DIAGNOSIS — I4891 Unspecified atrial fibrillation: Secondary | ICD-10-CM

## 2022-04-09 DIAGNOSIS — I714 Abdominal aortic aneurysm, without rupture, unspecified: Secondary | ICD-10-CM | POA: Insufficient documentation

## 2022-04-09 DIAGNOSIS — Z7901 Long term (current) use of anticoagulants: Secondary | ICD-10-CM | POA: Insufficient documentation

## 2022-04-09 DIAGNOSIS — Z79899 Other long term (current) drug therapy: Secondary | ICD-10-CM | POA: Insufficient documentation

## 2022-04-09 DIAGNOSIS — Z953 Presence of xenogenic heart valve: Secondary | ICD-10-CM | POA: Insufficient documentation

## 2022-04-09 DIAGNOSIS — E782 Mixed hyperlipidemia: Secondary | ICD-10-CM

## 2022-04-09 DIAGNOSIS — I5032 Chronic diastolic (congestive) heart failure: Secondary | ICD-10-CM

## 2022-04-09 DIAGNOSIS — E119 Type 2 diabetes mellitus without complications: Secondary | ICD-10-CM | POA: Insufficient documentation

## 2022-04-09 DIAGNOSIS — Z951 Presence of aortocoronary bypass graft: Secondary | ICD-10-CM | POA: Insufficient documentation

## 2022-04-09 DIAGNOSIS — I6529 Occlusion and stenosis of unspecified carotid artery: Secondary | ICD-10-CM | POA: Insufficient documentation

## 2022-04-09 DIAGNOSIS — G4733 Obstructive sleep apnea (adult) (pediatric): Secondary | ICD-10-CM | POA: Insufficient documentation

## 2022-04-09 DIAGNOSIS — N390 Urinary tract infection, site not specified: Secondary | ICD-10-CM | POA: Insufficient documentation

## 2022-04-09 DIAGNOSIS — Z5181 Encounter for therapeutic drug level monitoring: Secondary | ICD-10-CM

## 2022-04-09 DIAGNOSIS — E785 Hyperlipidemia, unspecified: Secondary | ICD-10-CM | POA: Insufficient documentation

## 2022-04-09 DIAGNOSIS — I5042 Chronic combined systolic (congestive) and diastolic (congestive) heart failure: Secondary | ICD-10-CM | POA: Insufficient documentation

## 2022-04-09 LAB — POCT INR: INR: 1.2 — AB (ref 2.0–3.0)

## 2022-04-09 MED ORDER — FUROSEMIDE 20 MG PO TABS: 40.0000 mg | ORAL_TABLET | ORAL | 3 refills | Status: DC | PRN

## 2022-04-09 MED ORDER — FUROSEMIDE 20 MG PO TABS: 40.0000 mg | ORAL_TABLET | Freq: Two times a day (BID) | ORAL | 6 refills | Status: DC

## 2022-04-09 MED ORDER — SPIRONOLACTONE 25 MG PO TABS: 25.0000 mg | ORAL_TABLET | Freq: Every day | ORAL | 5 refills | Status: DC

## 2022-04-09 NOTE — Patient Instructions (Signed)
Description   ?Take 4 tablets today and then START taking Warfarin 3 tablets daily.   ?Recheck INR in 2 weeks.  ?Cou[facility]in Clinic [phone].  ?  ?   ?

## 2022-04-09 NOTE — Patient Instructions (Addendum)
Thank you for coming in today ? ?TAKE Spirolactone 25 mg 1 tablet daily  ? ? ?Your physician recommends that you schedule a follow-up appointment in:  ?3 months with Dr. Aundra Dubin ? ?Your physician recommends that you return for lab work in: 2 weeks BMET  ? ?At the [facility], you and your health needs are our priority. As part of our continuing mission to provide you with exceptional heart care, we have created designated Provider Care Teams. These Care Teams include your primary Cardiologist (physician) and Advanced Practice Providers (APPs- Physician Assistants and Nurse Practitioners) who all work together to provide you with the care you need, when you need it.  ? ?You may see any of the following providers on your designated Care Team at your next follow up: ?Dr Glori Bickers ?Dr [name] ?Darrick Grinder, NP ?Lyda Jester, PA ?Jessica Milford,NP ?Marlyce Huge, PA ?Audry Riles, PharmD ? ? ?Please be sure to bring in all your medications bottles to every appointment.  ? ?If you have any questions or concerns before your next appointment please send [place] a message through Susan [place] or call our office at [phone].   ? ?TO LEAVE A MESSAGE FOR THE NURSE SELECT OPTION 2, PLEASE LEAVE A MESSAGE INCLUDING: ?YOUR NAME ?DATE OF BIRTH ?CALL BACK NUMBER ?REASON FOR CALL**this is important as we prioritize the call backs ? ?YOU WILL RECEIVE A CALL BACK THE SAME DAY AS LONG AS YOU CALL BEFORE 4:00 PM ? ? ? ?

## 2022-04-10 ENCOUNTER — Other Ambulatory Visit: Payer: Self-pay

## 2022-04-10 ENCOUNTER — Ambulatory Visit
Admission: RE | Admit: 2022-04-10 | Discharge: 2022-04-10 | Disposition: A | Discharge status: Home / Self Care | Payer: Self-pay | Source: Ambulatory Visit | Attending: Radiation Oncology | Admitting: Radiation Oncology

## 2022-04-10 DIAGNOSIS — Z510 Encounter for antineoplastic radiation therapy: Secondary | ICD-10-CM | POA: Insufficient documentation

## 2022-04-10 DIAGNOSIS — C3411 Malignant neoplasm of upper lobe, right bronchus or lung: Secondary | ICD-10-CM | POA: Diagnosis not present

## 2022-04-10 DIAGNOSIS — Z87891 Personal history of nicotine dependence: Secondary | ICD-10-CM | POA: Diagnosis not present

## 2022-04-11 NOTE — Progress Notes (Signed)
?  Radiation Oncology         [phone]) [phone] ?________________________________ ? ?Name: Sulo Janczak. MRN: [id number]  ?Date: 04/10/2022  DOB: [birth year] ? ?STEREOTACTIC BODY RADIOTHERAPY ?SIMULATION AND TREATMENT PLANNING NOTE ? ?  ICD-10-CM   ?1. 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)  C34.11   ?  ? ? ?DIAGNOSIS:  88 yo gentleman with a putative clinical stage IA2 (13 mm) non-small cell carcinoma of the right upper lung. ? ?NARRATIVE:  The patient was brought to the [facility].  Identity was confirmed.  All relevant records and images related to the planned course of therapy were reviewed.  The patient freely provided informed written consent to proceed with treatment after reviewing the details related to the planned course of therapy. The consent form was witnessed and verified by the simulation staff.  Then, the patient was set-up in a stable reproducible  supine position for radiation therapy.  A BodyFix immobilization pillow was fabricated for reproducible positioning.  Then I personally applied the abdominal compression paddle to limit respiratory excursion.  4D respiratoy motion management CT images were obtained.  Surface markings were placed.  The CT images were loaded into the planning software.  Then, using Cine, MIP, and standard views, the internal target volume (ITV) and planning target volumes (PTV) were delinieated, and avoidance structures were contoured.  Treatment planning then occurred.  The radiation prescription was entered and confirmed.  A total of two complex treatment devices were fabricated in the form of the BodyFix immobilization pillow and a neck accuform cushion.  I have requested : 3D Simulation  I have requested a DVH of the following structures: Heart, Lungs, Esophagus, Chest Wall, Brachial Plexus, Major Blood Vessels, and targets. ? ?SPECIAL TREATMENT PROCEDURE:  The planned course of therapy using radiation constitutes a special treatment procedure.  Special care is required in the management of this patient for the following reasons. This treatment constitutes a Special Treatment Procedure for the following reason: [ High dose per fraction requiring special monitoring for increased toxicities of treatment including daily imaging..  The special nature of the planned course of radiotherapy will require increased physician supervision and oversight to ensure patient's safety with optimal treatment outcomes.  This requires extended time and effort.   ? ?RESPIRATORY MOTION MANAGEMENT SIMULATION:  In order to account for effect of respiratory motion on target structures and other organs in the planning and delivery of radiotherapy, this patient underwent respiratory motion management simulation.  To accomplish this, when the patient was brought to the CT simulation planning suite, 4D respiratoy motion management CT images were obtained.  The CT images were loaded into the planning software.  Then, using a variety of tools including Cine, MIP, and standard views, the target volume and planning target volumes (PTV) were delineated.  Avoidance structures were contoured.  Treatment planning then occurred.  Dose volume histograms were generated and reviewed for each of the requested structure.  The resulting plan was carefully reviewed and approved today. ? ?PLAN:  The patient will receive 54 Gy in 3 fractions. ? ?________________________________ ? ?Sheral Apley Tammi Klippel, M.D. ? ?

## 2022-04-16 ENCOUNTER — Telehealth: Payer: Self-pay | Admitting: Family Medicine

## 2022-04-16 ENCOUNTER — Other Ambulatory Visit (HOSPITAL_COMMUNITY): Payer: Self-pay | Admitting: Cardiology

## 2022-04-16 DIAGNOSIS — Z87891 Personal history of nicotine dependence: Secondary | ICD-10-CM | POA: Diagnosis not present

## 2022-04-16 DIAGNOSIS — Z510 Encounter for antineoplastic radiation therapy: Secondary | ICD-10-CM | POA: Diagnosis present

## 2022-04-16 DIAGNOSIS — C3411 Malignant neoplasm of upper lobe, right bronchus or lung: Secondary | ICD-10-CM | POA: Diagnosis not present

## 2022-04-16 DIAGNOSIS — I4891 Unspecified atrial fibrillation: Secondary | ICD-10-CM

## 2022-04-16 NOTE — Telephone Encounter (Signed)
Prescription refill request received for warfarin ?Lov: 04/09/22 ?Next INR check: 04/23/22 ?Warfarin tablet strength: 3mg  ? ?Appropriate dose and refill sent to requested pharmacy.  ?

## 2022-04-16 NOTE — Telephone Encounter (Signed)
Copied from [place] [phone]. Topic: Medicare AWV ?>> Apr 16, 2022  2:49 PM Harris-Coley, Hannah Beat wrote: ?Reason for CRM: Attempted to schedule AWV. Unable to LVM.  Will try at later time. ?

## 2022-04-22 ENCOUNTER — Ambulatory Visit
Admission: RE | Admit: 2022-04-22 | Discharge: 2022-04-22 | Disposition: A | Discharge status: Home / Self Care | Payer: Self-pay | Source: Ambulatory Visit | Attending: Radiation Oncology | Admitting: Radiation Oncology

## 2022-04-22 ENCOUNTER — Other Ambulatory Visit: Payer: Self-pay

## 2022-04-22 DIAGNOSIS — C3411 Malignant neoplasm of upper lobe, right bronchus or lung: Secondary | ICD-10-CM

## 2022-04-22 DIAGNOSIS — Z510 Encounter for antineoplastic radiation therapy: Secondary | ICD-10-CM | POA: Diagnosis not present

## 2022-04-22 LAB — RAD ONC ARIA SESSION SUMMARY
Course Elapsed Days: 0
Plan Fractions Treated to Date: 1
Plan Prescribed Dose Per Fraction: 18 Gy
Plan Total Fractions Prescribed: 3
Plan Total Prescribed Dose: 54 Gy
Reference Point Dosage Given to Date: 18 Gy
Reference Point Session Dosage Given: 18 Gy
Session Number: 1

## 2022-04-23 ENCOUNTER — Ambulatory Visit: Payer: Self-pay

## 2022-04-23 ENCOUNTER — Ambulatory Visit (INDEPENDENT_AMBULATORY_CARE_PROVIDER_SITE_OTHER): Payer: Self-pay | Admitting: *Deleted

## 2022-04-23 DIAGNOSIS — I4891 Unspecified atrial fibrillation: Secondary | ICD-10-CM

## 2022-04-23 DIAGNOSIS — Z5181 Encounter for therapeutic drug level monitoring: Secondary | ICD-10-CM

## 2022-04-23 LAB — POCT INR: INR: 3.1 — AB (ref 2.0–3.0)

## 2022-04-23 NOTE — Patient Instructions (Signed)
Description   ?Today take 2 tablets of warfarin then continue taking Warfarin 3 tablets daily. Recheck INR in 3 weeks. Coumadin Clinic [phone].  ?  ?  ?

## 2022-04-24 ENCOUNTER — Other Ambulatory Visit: Payer: Self-pay

## 2022-04-24 ENCOUNTER — Ambulatory Visit
Admission: RE | Admit: 2022-04-24 | Discharge: 2022-04-24 | Disposition: A | Discharge status: Home / Self Care | Payer: Self-pay | Source: Ambulatory Visit | Attending: Radiation Oncology | Admitting: Radiation Oncology

## 2022-04-24 DIAGNOSIS — C3411 Malignant neoplasm of upper lobe, right bronchus or lung: Secondary | ICD-10-CM

## 2022-04-24 DIAGNOSIS — Z510 Encounter for antineoplastic radiation therapy: Secondary | ICD-10-CM | POA: Diagnosis not present

## 2022-04-24 LAB — RAD ONC ARIA SESSION SUMMARY
Course Elapsed Days: 2
Plan Fractions Treated to Date: 2
Plan Prescribed Dose Per Fraction: 18 Gy
Plan Total Fractions Prescribed: 3
Plan Total Prescribed Dose: 54 Gy
Reference Point Dosage Given to Date: 36 Gy
Reference Point Session Dosage Given: 18 Gy
Session Number: 2

## 2022-04-27 ENCOUNTER — Ambulatory Visit
Admission: RE | Admit: 2022-04-27 | Discharge: 2022-04-27 | Disposition: A | Discharge status: Home / Self Care | Payer: Self-pay | Source: Ambulatory Visit | Attending: Radiation Oncology | Admitting: Radiation Oncology

## 2022-04-27 ENCOUNTER — Other Ambulatory Visit: Payer: Self-pay

## 2022-04-27 DIAGNOSIS — Z510 Encounter for antineoplastic radiation therapy: Secondary | ICD-10-CM | POA: Diagnosis not present

## 2022-04-27 DIAGNOSIS — Z87891 Personal history of nicotine dependence: Secondary | ICD-10-CM | POA: Diagnosis not present

## 2022-04-27 DIAGNOSIS — C3411 Malignant neoplasm of upper lobe, right bronchus or lung: Secondary | ICD-10-CM | POA: Diagnosis not present

## 2022-04-27 LAB — RAD ONC ARIA SESSION SUMMARY
Course Elapsed Days: 5
Plan Fractions Treated to Date: 3
Plan Prescribed Dose Per Fraction: 18 Gy
Plan Total Fractions Prescribed: 3
Plan Total Prescribed Dose: 54 Gy
Reference Point Dosage Given to Date: 54 Gy
Reference Point Session Dosage Given: 18 Gy
Session Number: 3

## 2022-04-28 ENCOUNTER — Other Ambulatory Visit (HOSPITAL_COMMUNITY): Payer: Self-pay

## 2022-04-28 ENCOUNTER — Ambulatory Visit (HOSPITAL_COMMUNITY)
Admission: RE | Admit: 2022-04-28 | Discharge: 2022-04-28 | Disposition: A | Discharge status: Home / Self Care | Payer: Self-pay | Source: Ambulatory Visit | Attending: Cardiology | Admitting: Cardiology

## 2022-04-28 DIAGNOSIS — I5032 Chronic diastolic (congestive) heart failure: Secondary | ICD-10-CM | POA: Insufficient documentation

## 2022-04-28 LAB — BASIC METABOLIC PANEL
Anion gap: 7 (ref 5–15)
BUN: 35 mg/dL — ABNORMAL HIGH (ref 8–23)
CO2: 24 mmol/L (ref 22–32)
Calcium: 9.2 mg/dL (ref 8.9–10.3)
Chloride: 107 mmol/L (ref 98–111)
Creatinine, Ser: 1.12 mg/dL (ref 0.61–1.24)
GFR, Estimated: >60 mL/min (ref >60)
Glucose, Bld: 118 mg/dL — ABNORMAL HIGH (ref 70–99)
Potassium: 4.7 mmol/L (ref 3.5–5.1)
Sodium: 138 mmol/L (ref 135–145)

## 2022-04-29 ENCOUNTER — Ambulatory Visit: Payer: Self-pay

## 2022-05-05 ENCOUNTER — Ambulatory Visit (INDEPENDENT_AMBULATORY_CARE_PROVIDER_SITE_OTHER): Payer: Self-pay | Admitting: Physician Assistant

## 2022-05-05 ENCOUNTER — Telehealth: Payer: Self-pay

## 2022-05-05 VITALS — BP 162/70 | HR 82 | Temp 98.8°F | Ht 67.0 in | Wt 190.0 lb

## 2022-05-05 DIAGNOSIS — R300 Dysuria: Secondary | ICD-10-CM

## 2022-05-05 DIAGNOSIS — R310 Gross hematuria: Secondary | ICD-10-CM

## 2022-05-05 LAB — POCT URINALYSIS DIPSTICK
Bilirubin, UA: NEGATIVE
Glucose, UA: NEGATIVE
Ketones, UA: NEGATIVE
Nitrite, UA: NEGATIVE
Protein, UA: POSITIVE — AB
Spec Grav, UA: >=1.03 — AB (ref 1.010–1.025)
Urobilinogen, UA: 0.2 E.U./dL
pH, UA: 5 (ref 5.0–8.0)

## 2022-05-05 NOTE — Telephone Encounter (Signed)
Patient has called back in.  States he received a voicemail in regard to UTI.    Patient states he is having a lot of discomfort.   Is there something that can be prescribed for discomfort while waiting on culture to come back?  [facility] uses [facility].

## 2022-05-05 NOTE — Progress Notes (Signed)
Subjective:    Patient ID: Nathaniel [name]., male    DOB: 07-11-1932, 88 y.o.   MRN: [id number]  Chief Complaint  Patient presents with   Hematuria    Pt c/o blood in urine; pt noticed little traces since his last treatment a week ago; pt c/o pain when urinating     HPI Patient is in today for hematuria. Patient is currently being treated for putative cancer of right upper lobe of lung with Gabbs Garvin. Last radiation treatment was on 04/27/2022 (8 days ago)  Started to notice blood in urine on 05/01/2022 Notices pain just prior to urinating, "sputters" / stops and starts while urinating "Worse" then it "gets better" or he would've been seen sooner +Nocturia 4-6 times last night, bright red blood almost every time, no clots No fevers / chills / body aches, no dizziness, no n/v/severe abd pain  Hx of bladder infection years ago presented similarly; no hx renal stones   Past Medical History:  Diagnosis Date   AAA (abdominal aortic aneurysm) (HCC)    Atrial fibrillation (HCC)    Atrial fibrillation (HCC)    CAD (coronary artery disease)    Cataract    CHF (congestive heart failure) (HCC)    Chronic kidney disease    Colon polyps    adenomatous   Diabetes mellitus    Diverticulosis    Dysrhythmia    A-Fib   GERD (gastroesophageal reflux disease)    Gout    Hernia, abdominal    History of aortic valve replacement with bioprosthetic valve    2007   Hx: UTI (urinary tract infection)    Hyperlipemia    Hypertension    Internal hemorrhoids    Peripheral vascular disease ([place])    Shortness of breath    with exertion    Past Surgical History:  Procedure Laterality Date   ABDOMINAL AORTIC ENDOVASCULAR STENT GRAFT N/A 04/13/2014   Procedure: ABDOMINAL AORTIC ENDOVASCULAR STENT GRAFT;  Surgeon: Serafina Mitchell, MD;  Location: [place];  Service: Vascular;  Laterality: N/A;   ANGIOPLASTY     Unsure if he had stents put in    [place] N/A 06/06/2014   Procedure: CARDIOVERSION;  Surgeon: Larey Dresser, MD;  Location: [facility] ENDOSCOPY;  Service: Cardiovascular;  Laterality: N/A;   CARDIOVERSION N/A 08/21/2014   Procedure: CARDIOVERSION;  Surgeon: Larey Dresser, MD;  Location: [place];  Service: Cardiovascular;  Laterality: N/A;   CARDIOVERSION N/A 12/22/2017   Procedure: CARDIOVERSION;  Surgeon: Larey Dresser, MD;  Location: [place];  Service: Cardiovascular;  Laterality: N/A;   CARDIOVERSION N/A 05/25/2019   Procedure: CARDIOVERSION;  Surgeon: Larey Dresser, MD;  Location: [facility] ENDOSCOPY;  Service: Cardiovascular;  Laterality: N/A;   CARDIOVERSION N/A 06/14/2019   Procedure: CARDIOVERSION;  Surgeon: Jerline Pain, MD;  Location: [facility] ENDOSCOPY;  Service: Cardiovascular;  Laterality: N/A;   CARDIOVERSION N/A 12/12/2020   Procedure: CARDIOVERSION;  Surgeon: Larey Dresser, MD;  Location: [facility] ENDOSCOPY;  Service: Cardiovascular;  Laterality: N/A;   CARDIOVERSION N/A 01/29/2021   Procedure: CARDIOVERSION;  Surgeon: Larey Dresser, MD;  Location: [facility] ENDOSCOPY;  Service: Cardiovascular;  Laterality: N/A;   CARDIOVERSION N/A 03/19/2022   Procedure: CARDIOVERSION;  Surgeon: Larey Dresser, MD;  Location: [place];  Service: Cardiovascular;  Laterality: N/A;   CORONARY ARTERY BYPASS GRAFT     ENDARTERECTOMY Right 02/02/2018   Procedure:  ENDARTERECTOMY CAROTID RIGHT;  Surgeon: Serafina Mitchell, MD;  Location: [facility] OR;  Service: Vascular;  Laterality: Right;   EYE SURGERY Bilateral    cataract surgery   HERNIA REPAIR     2 operations   LEFT HEART CATHETERIZATION WITH CORONARY/GRAFT ANGIOGRAM N/A 07/10/2014   Procedure: LEFT HEART CATHETERIZATION WITH Beatrix Fetters;  Surgeon: Larey Dresser, MD;  Location: [facility] CATH LAB;  Service: Cardiovascular;  Laterality: N/A;   TEE WITHOUT CARDIOVERSION N/A 05/25/2019   Procedure: TRANSESOPHAGEAL ECHOCARDIOGRAM (TEE);  Surgeon: Larey Dresser, MD;  Location: [facility]  ENDOSCOPY;  Service: Cardiovascular;  Laterality: N/A;   TEE WITHOUT CARDIOVERSION N/A 01/29/2021   Procedure: TRANSESOPHAGEAL ECHOCARDIOGRAM (TEE);  Surgeon: Larey Dresser, MD;  Location: [facility] ENDOSCOPY;  Service: Cardiovascular;  Laterality: N/A;   TEE WITHOUT CARDIOVERSION N/A 03/19/2022   Procedure: TRANSESOPHAGEAL ECHOCARDIOGRAM (TEE);  Surgeon: Larey Dresser, MD;  Location: [facility] ENDOSCOPY;  Service: Cardiovascular;  Laterality: N/A;   TONSILLECTOMY     VIDEO BRONCHOSCOPY Bilateral 12/25/2015   Procedure: VIDEO BRONCHOSCOPY WITHOUT FLUORO;  Surgeon: Javier Glazier, MD;  Location: [place];  Service: Cardiopulmonary;  Laterality: Bilateral;    Family History  Problem Relation Age of Onset   Heart disease Mother    Heart disease Father        before age 81   AAA (abdominal aortic aneurysm) Father    Colon cancer Neg Hx     Social History   Tobacco Use   Smoking status: Former    Packs/day: 0.25    Years: 20.00    Pack years: 5.00    Types: Cigarettes    Quit date: 03/23/1992    Years since quitting: 30.1    Passive exposure: Past   Smokeless tobacco: Never  Vaping Use   Vaping Use: Never used  Substance Use Topics   Alcohol use: Yes    Alcohol/week: 5.0 standard drinks    Types: 5 Glasses of wine per week   Drug use: No     Allergies  Allergen Reactions   Grass Extracts [Gramineae Pollens] Other (See Comments)    Sneezing and runny nose   Milk-Related Compounds Other (See Comments)    Just Yogurt.  Makes him very lethargic.   Amiodarone Anxiety    depression    Review of Systems NEGATIVE UNLESS OTHERWISE INDICATED IN HPI      Objective:     BP (!) 162/70 (BP Location: Right Arm) Comment: Manual  Pulse 82   Temp 98.8 F (37.1 C) (Temporal)   Ht 5\' 7"  (1.702 m)   Wt 190 lb (86.2 kg)   SpO2 98%   BMI 29.76 kg/m   Wt Readings from Last 3 Encounters:  05/05/22 190 lb (86.2 kg)  04/09/22 194 lb 3.2 oz (88.1 kg)  04/06/22 190 lb (86.2 kg)     BP Readings from Last 3 Encounters:  05/05/22 (!) 162/70  04/09/22 (!) 148/72  04/06/22 118/64     Physical Exam Constitutional:      General: He is not in acute distress.    Appearance: Normal appearance.  Cardiovascular:     Rate and Rhythm: Normal rate and regular rhythm.     Pulses: Normal pulses.     Heart sounds: Normal heart sounds.  Pulmonary:     Effort: Pulmonary effort is normal.     Breath sounds: Normal breath sounds.  Abdominal:     General: Abdomen is flat. Bowel sounds are normal.     Palpations:  Abdomen is soft. There is no mass.     Tenderness: There is no abdominal tenderness. There is no right CVA tenderness, left CVA tenderness or guarding.  Neurological:     Mental Status: He is alert.  Psychiatric:        Mood and Affect: Mood normal.       Assessment & Plan:   Problem List Items Addressed This Visit   None Visit Diagnoses     Gross hematuria    -  Primary   Relevant Orders   POCT urinalysis dipstick (Completed)   Urine Culture   Dysuria          1. Gross hematuria 2. Dysuria Urinalysis and symptoms concerning for acute uncomplicated cystitis.  Prior history shows resistant organism in urine culture.  Discussed with patient's PCP and we are going to hold on antibiotics until his urine culture comes back.  Patient advised to continue pushing fluids.  ER precautions advised.   Return if symptoms worsen or fail to improve.  This note was prepared with assistance of Systems analyst. Occasional wrong-word or sound-a-like substitutions may have occurred due to the inherent limitations of voice recognition software.    Dee Maday M Elizeo Rodriques, PA-C

## 2022-05-05 NOTE — Patient Instructions (Signed)
Good to meet you today! I am going to talk with Dr. Yong Channel about appropriate treatment and get back with you this afternoon. We are going to send a urine culture and call with results in the next few days.  If any sudden severe pain, fever, dizziness, vomiting develops, please present to the nearest ED.

## 2022-05-06 ENCOUNTER — Other Ambulatory Visit: Payer: Self-pay | Admitting: Physician Assistant

## 2022-05-06 NOTE — Telephone Encounter (Signed)
LVM for pt with Alyssa's recommendations and call back number with any questions or concerns.

## 2022-05-06 NOTE — Telephone Encounter (Signed)
See note and advise °

## 2022-05-08 ENCOUNTER — Other Ambulatory Visit: Payer: Self-pay | Admitting: Physician Assistant

## 2022-05-08 LAB — URINE CULTURE
MICRO NUMBER:: 13433367
SPECIMEN QUALITY:: ADEQUATE

## 2022-05-08 MED ORDER — NITROFURANTOIN MONOHYD MACRO 100 MG PO CAPS: 100.0000 mg | ORAL_CAPSULE | Freq: Two times a day (BID) | ORAL | 0 refills | Status: AC

## 2022-05-08 NOTE — Telephone Encounter (Signed)
Pt was calling to check on his labs. I advised pt to try the AZO, per previous msg, until he hears back from [place].

## 2022-05-11 ENCOUNTER — Encounter: Payer: Self-pay | Admitting: Radiation Oncology

## 2022-05-12 NOTE — Telephone Encounter (Signed)
See Lab Notes 

## 2022-05-13 ENCOUNTER — Ambulatory Visit (INDEPENDENT_AMBULATORY_CARE_PROVIDER_SITE_OTHER): Payer: Self-pay | Admitting: *Deleted

## 2022-05-13 DIAGNOSIS — I4891 Unspecified atrial fibrillation: Secondary | ICD-10-CM

## 2022-05-13 DIAGNOSIS — Z5181 Encounter for therapeutic drug level monitoring: Secondary | ICD-10-CM

## 2022-05-13 LAB — POCT INR: INR: 2.5 (ref 2.0–3.0)

## 2022-05-13 NOTE — Patient Instructions (Addendum)
Description   Continue taking Warfarin 3 tablets daily. Recheck INR in 4 weeks  Coumadin Clinic [phone].

## 2022-05-21 DIAGNOSIS — C44319 Basal cell carcinoma of skin of other parts of face: Secondary | ICD-10-CM | POA: Diagnosis not present

## 2022-05-21 DIAGNOSIS — L820 Inflamed seborrheic keratosis: Secondary | ICD-10-CM | POA: Diagnosis not present

## 2022-05-21 DIAGNOSIS — C44519 Basal cell carcinoma of skin of other part of trunk: Secondary | ICD-10-CM | POA: Diagnosis not present

## 2022-05-21 DIAGNOSIS — D1801 Hemangioma of skin and subcutaneous tissue: Secondary | ICD-10-CM | POA: Diagnosis not present

## 2022-05-21 DIAGNOSIS — Z85828 Personal history of other malignant neoplasm of skin: Secondary | ICD-10-CM | POA: Diagnosis not present

## 2022-05-21 DIAGNOSIS — L821 Other seborrheic keratosis: Secondary | ICD-10-CM | POA: Diagnosis not present

## 2022-05-21 DIAGNOSIS — L812 Freckles: Secondary | ICD-10-CM | POA: Diagnosis not present

## 2022-05-21 DIAGNOSIS — D485 Neoplasm of uncertain behavior of skin: Secondary | ICD-10-CM | POA: Diagnosis not present

## 2022-06-05 ENCOUNTER — Encounter: Payer: Self-pay | Admitting: *Deleted

## 2022-06-10 ENCOUNTER — Ambulatory Visit (INDEPENDENT_AMBULATORY_CARE_PROVIDER_SITE_OTHER): Payer: BLUE CROSS/BLUE SHIELD

## 2022-06-10 DIAGNOSIS — Z5181 Encounter for therapeutic drug level monitoring: Secondary | ICD-10-CM

## 2022-06-10 DIAGNOSIS — I4891 Unspecified atrial fibrillation: Secondary | ICD-10-CM

## 2022-06-10 LAB — POCT INR: INR: 1.9 — AB (ref 2.0–3.0)

## 2022-06-10 NOTE — Patient Instructions (Signed)
Description   Take 3.5 tablets today and then continue taking Warfarin 3 tablets daily. Recheck INR in 4 weeks  Coumadin Clinic [phone].

## 2022-06-15 ENCOUNTER — Telehealth: Payer: Self-pay | Admitting: Family Medicine

## 2022-06-15 NOTE — Telephone Encounter (Signed)
Pt is requesting rx from a previous appt for UTI. He states it is back and would like to get back on this rx. I advised an appt but he states he would rather not come in due to already having an appt scheduled. Please advise  MEDICATION:nitrofurantoin, macrocrystal-monohydrate, (MACROBID) 100 MG capsule  PHARMACY: [place], [place] - [address] Phone:  [phone]  Fax:  [phone]

## 2022-06-15 NOTE — Telephone Encounter (Signed)
On almost all patients I want a visit as well as ua/urine culture- most certainly in male patients- he will need visit on Wednesday with one of my colleagues or with me on Thursday on return  Worsening symptoms he should seek care immediately

## 2022-06-15 NOTE — Telephone Encounter (Signed)
FYI

## 2022-06-17 NOTE — Telephone Encounter (Signed)
FYI--Attempted to call to schedule patient with Jeanie Sewer on 07/06 for OV, but received no answer. I was unable to leave a VM due to mailbox being full.

## 2022-06-17 NOTE — Telephone Encounter (Signed)
See below

## 2022-06-23 ENCOUNTER — Encounter: Payer: Self-pay | Admitting: Family Medicine

## 2022-06-23 ENCOUNTER — Ambulatory Visit (INDEPENDENT_AMBULATORY_CARE_PROVIDER_SITE_OTHER): Payer: Medicare Other | Admitting: Family Medicine

## 2022-06-23 VITALS — BP 110/54 | HR 65 | Temp 98.8°F | Ht 67.0 in | Wt 194.0 lb

## 2022-06-23 DIAGNOSIS — R319 Hematuria, unspecified: Secondary | ICD-10-CM | POA: Diagnosis not present

## 2022-06-23 DIAGNOSIS — E118 Type 2 diabetes mellitus with unspecified complications: Secondary | ICD-10-CM

## 2022-06-23 DIAGNOSIS — I25810 Atherosclerosis of coronary artery bypass graft(s) without angina pectoris: Secondary | ICD-10-CM

## 2022-06-23 DIAGNOSIS — I5042 Chronic combined systolic (congestive) and diastolic (congestive) heart failure: Secondary | ICD-10-CM

## 2022-06-23 DIAGNOSIS — I779 Disorder of arteries and arterioles, unspecified: Secondary | ICD-10-CM

## 2022-06-23 DIAGNOSIS — M1A9XX0 Chronic gout, unspecified, without tophus (tophi): Secondary | ICD-10-CM

## 2022-06-23 DIAGNOSIS — I4891 Unspecified atrial fibrillation: Secondary | ICD-10-CM

## 2022-06-23 NOTE — Progress Notes (Signed)
Phone [phone] In person visit   Subjective:   Nathaniel [name]. is a 88 y.o. year old very pleasant male patient who presents for/with See problem oriented charting Chief Complaint  Patient presents with   Follow-up    Past Medical History-  Patient Active Problem List   Diagnosis Date Noted   Controlled type 2 diabetes mellitus with complication, without long-term current use of insulin ([place]) 02/19/2022    Priority: High   Afib ([place]) 06/12/2019    Priority: High   Chronic combined systolic and diastolic heart failure ([place]) 06/12/2014    Priority: High   CAD (coronary artery disease) of artery bypass graft 09/14/2012    Priority: High   Carotid artery disease without cerebral infarction ([place]) 09/14/2012    Priority: High   History of aortic valve replacement with bioprosthetic valve 09/14/2012    Priority: High   Aortic atherosclerosis ([place]) 02/19/2022    Priority: Medium    Chronic gout without tophus 02/19/2022    Priority: Medium    BPH (benign prostatic hyperplasia) 10/11/2021    Priority: Medium    Asymptomatic carotid artery stenosis without infarction, right 02/02/2018    Priority: Medium    Solitary pulmonary nodule 03/23/2016    Priority: Medium    Pulmonary nodule 12/25/2015    Priority: Medium    AAA (abdominal aortic aneurysm) without rupture ([place]) 04/13/2014    Priority: Medium    Occlusion and stenosis of carotid artery without mention of cerebral infarction 04/09/2014    Priority: Medium    Abdominal aortic aneurysm ([place]) 09/14/2012    Priority: Medium    Peripheral vascular disease with claudication ([place]) 09/14/2012    Priority: Medium    Essential hypertension 09/14/2012    Priority: Medium    Hyperlipidemia with target LDL less than 70 09/14/2012    Priority: Medium    Secondary hypercoagulable state ([place]) 02/19/2022    Priority: Low   Rhabdomyolysis 10/10/2021    Priority: Low   Aspiration of foreign body     Priority: Low   Foreign  body aspiration 12/23/2015    Priority: Low   Encounter for therapeutic drug monitoring 03/28/2015    Priority: Low   Putative cancer of right upper lobe of lung ([place]) 04/01/2022    Medications- reviewed and updated Current Outpatient Medications  Medication Sig Dispense Refill   allopurinol (ZYLOPRIM) 300 MG tablet Take 1 tablet (300 mg total) by mouth daily. 90 tablet 3   atorvastatin (LIPITOR) 10 MG tablet Take 10 mg by mouth 3 (three) times a week. Take with 20 mg dose to equal 30 mg 3 days weekly     atorvastatin (LIPITOR) 20 MG tablet Take 1 tab daily every other day ALTERNATING with 1.5 tabs daily every other day (Patient taking differently: Take 20 mg by mouth daily.) 135 tablet 3   Coenzyme Q10 (CO Q10) 200 MG CAPS Take 200 mg by mouth daily.     dofetilide (TIKOSYN) 250 MCG capsule TAKE ONE CAPSULE TWICE A DAY 60 capsule 11   dorzolamide-timolol (COSOPT) 22.3-6.8 MG/ML ophthalmic solution Place 1 drop into both eyes 2 (two) times daily.     doxazosin (CARDURA) 1 MG tablet Daily after supper (Patient taking differently: Take 1 mg by mouth at bedtime.) 90 tablet 3   doxazosin (CARDURA) 2 MG tablet Take 2 mg by mouth daily.     ezetimibe (ZETIA) 10 MG tablet Take 1 tablet (10 mg total) by mouth at bedtime. 90 tablet 3  furosemide (LASIX) 20 MG tablet Take 2 tablets (40 mg total) by mouth 2 (two) times daily. 120 tablet 6   latanoprost (XALATAN) 0.005 % ophthalmic solution Place 1 drop into the left eye at bedtime.     metoprolol tartrate (LOPRESSOR) 25 MG tablet Take 25 mg by mouth 2 (two) times daily.     Misc Natural Products (GLUCOSAMINE CHONDROITIN TRIPLE) TABS Take 1 tablet by mouth daily.     ramipril (ALTACE) 2.5 MG capsule Take 1 capsule (2.5 mg total) by mouth 2 (two) times daily. 90 capsule 4   REPATHA SURECLICK 465 MG/ML SOAJ INJECT 1 PEN INTO SKIN EVERY 14 DAYS 2 mL 11   spironolactone (ALDACTONE) 25 MG tablet Take 1 tablet (25 mg total) by mouth daily. 30 tablet 5    tamsulosin (FLOMAX) 0.4 MG CAPS capsule Take 1 capsule (0.4 mg total) by mouth 2 (two) times daily. 180 capsule 3   warfarin (COUMADIN) 3 MG tablet Take 3 tablets (9mg ) daily or as directed by [facility] 90 tablet 2   No current facility-administered medications for this visit.     Objective:  BP (!) 110/54   Pulse 65   Temp 98.8 F (37.1 C)   Ht 5\' 7"  (1.702 m)   Wt 194 lb (88 kg)   SpO2 94%   BMI 30.38 kg/m  Gen: NAD, resting comfortably CV: Regular rate but sounds irregular rhythm (cannot prove a fib without EKG) no murmurs rubs or gallops Lungs: CTAB no crackles, wheeze, rhonchi Abdomen: soft/nontender/nondistended/normal bowel sounds. No rebound or guarding.  Ext: trace edema Skin: warm, dry     Assessment and Plan   #prevnar 20 2nd visit planned but he wants to wait to make sure medicare kicks back in first  #Blood in urine/gross hematuria- noted in may and started on macrobid for UTI by our office. Symptoms resolved and then recurred- had another short course  -ordered urine microscopic and culture- he may have to come back for these if cant urinate  #difficult situation - moved last thanksgiving and with transtion reports he ended up getting dropped by medicare A+[employer]- they dont call him back even when he leaves messages -waiting to pay bills in case there is back pay- was advised by lawyer and attorney- still stressful for him  #Lung nodule- listed as putative cancer of right upper lung- ? Cancer- had follow up CT earlier this year- we referred to Dr. Valeta Harms who referred to Dr. Tammi Klippel- he had pet scan and they opted for SBRT- he has done well with this.   #CHF-systolic and diastolic mild-follows with Dr. Aundra Dubin #hypertension S: medication: spironolactone 25 mg daily (per patient) metoprolol 25 mg BID, cardura 1 mg, ramipril 2.5 mg BID and Lasix 40 mg in morning and 20 mg in the evening A/P: weight down 4 lbs and no increased edema- CHF appears stable- continue  current meds. BP well controlled- continue current medicine  #CAD- CABGand redo CABG -also has bioprosthetic aortic valve replacement history #PAD- >50% bilateral focal SFA stenosis- not cilostazol candidate with CHF #Carotid stenosis- s/p CEA in 2019- follows with Dr. Trula Slade- has appointment with Dr. Aundra Dubin soon #hyperlipidemia S: Medication:Zetia 10 mg at bedtime,  atorvastatin 20 mg and 30 mg on alternating days (coQ10), also prescribed repatha- has a hard time mentally self injecting- and #s go up- twice in past 2.5 months - some SOB but no chest pain.   Not on aspirin as a result of being on coumadin. Denies claudication Lab  Results  Component Value Date   CHOL 203 (H) 10/03/2021   HDL 49 10/03/2021   LDLCALC 113 (H) 10/03/2021   LDLDIRECT 98 05/08/2009   TRIG 203 (H) 10/03/2021   CHOLHDL 4.1 10/03/2021    A/P: CAD with stable SOB- continue current medicine -continue follow up with vascular about carotid stenosis with CEA history- offered to order today but he wants to call vascular- continue current meds for risk factor modification.  PAD_ denies claudication- continue current meds Lipids above goal- encouraged consistent use of repatha- does good job with atorvastatin and zetia  # atrial fibrillation S:medication: dofetilide/tikosyn 250 mcg BID, metoprolol 25 mg BID, coumadin - had been managed by cardiology clinic -history of cardioversion with plan for repeat in march 2023 -CNS effects on amiodarone in past -difficulty with rate control in past so plan has been to stay out of a fib A/P: Controlled. Continue current medications.    # Diabetes- prior followed by Dr. Elyse Hsu A1C- 05/20/2021- 7.0 S: Medication:none  -UTI on jardiance   A/P: hopefully stable- update a1c today- release labs form march (he did not come back for labs we ordered then0. Continue current meds for now  # AAA S:endovascular repair 2015- follows with Dr. Trula Slade now  A/P: has done well- continue  BP control- enocuraged follow up   #Gout S: no flares in years on allopurinol 300mg - he stopped a month ago and no episodes A/P:he wants to stay off med for now if recurrence we will need to restart - next labs will order uric acid (if order labs separate from march labs may not get completed in lab)  Recommended follow up: Return in about 4 months (around 10/24/2022) for followup or sooner if needed.Schedule b4 you leave. Future Appointments  Date Time Provider [place]  07/08/2022  8:45 AM [facility]  07/15/2022 11:20 AM Larey Dresser, MD [facility] None    Lab/Order associations:   ICD-10-CM   1. Hematuria, unspecified type  R31.9 Urine Culture    Urine Microscopic    2. Controlled type 2 diabetes mellitus with complication, without long-term current use of insulin (HCC)  E11.8     3. Chronic combined systolic and diastolic heart failure (HCC)  I50.42     4. Carotid artery disease without cerebral infarction (HCC)  I77.9     5. Coronary artery disease involving coronary bypass graft of native heart without angina pectoris  I25.810     6. Atrial fibrillation, unspecified type ([place])  I48.91       No orders of the defined types were placed in this encounter.   Return precautions advised.  Garret Reddish, MD

## 2022-06-23 NOTE — Patient Instructions (Addendum)
Health Maintenance Due  Topic Date Due   OPHTHALMOLOGY EXAM -have results sent to [place] next month Never done   Call Dr. Trula Slade with vascular for follow up  Please stop by lab before you go - labs for march visit If you have mychart- we will send your results within 3 business days of [place] receiving them.  If you do not have mychart- we will call you about results within 5 business days of [place] receiving them.  *please also note that you will see labs on mychart as soon as they post. I will later go in and write notes on them- will say "notes from Dr. Yong Channel"   Recommended follow up: Return in about 4 months (around 10/24/2022) for followup or sooner if needed.Schedule b4 you leave.

## 2022-06-24 LAB — COMPREHENSIVE METABOLIC PANEL
ALT: 12 U/L (ref 0–53)
AST: 20 U/L (ref 0–37)
Albumin: 4.4 g/dL (ref 3.5–5.2)
Alkaline Phosphatase: 53 U/L (ref 39–117)
BUN: 20 mg/dL (ref 6–23)
CO2: 27 mEq/L (ref 19–32)
Calcium: 9.7 mg/dL (ref 8.4–10.5)
Chloride: 99 mEq/L (ref 96–112)
Creatinine, Ser: 1.24 mg/dL (ref 0.40–1.50)
GFR: 52.01 mL/min — ABNORMAL LOW (ref >60.00)
Glucose, Bld: 91 mg/dL (ref 70–99)
Potassium: 4.4 mEq/L (ref 3.5–5.1)
Sodium: 134 mEq/L — ABNORMAL LOW (ref 135–145)
Total Bilirubin: 0.9 mg/dL (ref 0.2–1.2)
Total Protein: 7.9 g/dL (ref 6.0–8.3)

## 2022-06-24 LAB — URINE CULTURE
MICRO NUMBER:: 13631314
SPECIMEN QUALITY:: ADEQUATE

## 2022-06-24 LAB — CBC WITH DIFFERENTIAL/PLATELET
Basophils Absolute: 0.1 10*3/uL (ref 0.0–0.1)
Basophils Relative: 0.9 % (ref 0.0–3.0)
Eosinophils Absolute: 0.4 10*3/uL (ref 0.0–0.7)
Eosinophils Relative: 5.7 % — ABNORMAL HIGH (ref 0.0–5.0)
HCT: 40.9 % (ref 39.0–52.0)
Hemoglobin: 13.7 g/dL (ref 13.0–17.0)
Lymphocytes Relative: 22.3 % (ref 12.0–46.0)
Lymphs Abs: 1.4 10*3/uL (ref 0.7–4.0)
MCHC: 33.5 g/dL (ref 30.0–36.0)
MCV: 90.4 fl (ref 78.0–100.0)
Monocytes Absolute: 0.6 10*3/uL (ref 0.1–1.0)
Monocytes Relative: 8.9 % (ref 3.0–12.0)
Neutro Abs: 4 10*3/uL (ref 1.4–7.7)
Neutrophils Relative %: 62.2 % (ref 43.0–77.0)
Platelets: 220 10*3/uL (ref 150.0–400.0)
RBC: 4.52 Mil/uL (ref 4.22–5.81)
RDW: 13.9 % (ref 11.5–15.5)
WBC: 6.3 10*3/uL (ref 4.0–10.5)

## 2022-06-24 LAB — URINALYSIS, MICROSCOPIC ONLY

## 2022-06-24 LAB — HEMOGLOBIN A1C: Hgb A1c MFr Bld: 6.9 % — ABNORMAL HIGH (ref 4.6–6.5)

## 2022-06-24 LAB — URIC ACID: Uric Acid, Serum: 5.5 mg/dL (ref 4.0–7.8)

## 2022-06-26 ENCOUNTER — Other Ambulatory Visit: Payer: Self-pay

## 2022-06-26 DIAGNOSIS — R319 Hematuria, unspecified: Secondary | ICD-10-CM

## 2022-07-04 ENCOUNTER — Other Ambulatory Visit (HOSPITAL_COMMUNITY): Payer: Self-pay | Admitting: Cardiology

## 2022-07-08 ENCOUNTER — Ambulatory Visit (INDEPENDENT_AMBULATORY_CARE_PROVIDER_SITE_OTHER): Payer: Self-pay

## 2022-07-08 DIAGNOSIS — I4891 Unspecified atrial fibrillation: Secondary | ICD-10-CM

## 2022-07-08 DIAGNOSIS — Z5181 Encounter for therapeutic drug level monitoring: Secondary | ICD-10-CM

## 2022-07-08 LAB — POCT INR: INR: 2 (ref 2.0–3.0)

## 2022-07-08 NOTE — Patient Instructions (Signed)
Description   Take 3.5 tablets today and then continue taking Warfarin 3 tablets daily. Recheck INR in 4 weeks  [facility] [phone].

## 2022-07-15 ENCOUNTER — Telehealth (HOSPITAL_COMMUNITY): Payer: Self-pay | Admitting: Cardiology

## 2022-07-15 ENCOUNTER — Ambulatory Visit (HOSPITAL_COMMUNITY)
Admission: RE | Admit: 2022-07-15 | Discharge: 2022-07-15 | Disposition: A | Discharge status: Home / Self Care | Payer: Self-pay | Source: Ambulatory Visit | Attending: Cardiology | Admitting: Cardiology

## 2022-07-15 ENCOUNTER — Encounter (HOSPITAL_COMMUNITY): Payer: Self-pay | Admitting: Cardiology

## 2022-07-15 VITALS — BP 104/60 | HR 50 | Wt 193.2 lb

## 2022-07-15 DIAGNOSIS — Z951 Presence of aortocoronary bypass graft: Secondary | ICD-10-CM | POA: Insufficient documentation

## 2022-07-15 DIAGNOSIS — Z923 Personal history of irradiation: Secondary | ICD-10-CM | POA: Insufficient documentation

## 2022-07-15 DIAGNOSIS — I480 Paroxysmal atrial fibrillation: Secondary | ICD-10-CM

## 2022-07-15 DIAGNOSIS — G4733 Obstructive sleep apnea (adult) (pediatric): Secondary | ICD-10-CM | POA: Insufficient documentation

## 2022-07-15 DIAGNOSIS — E119 Type 2 diabetes mellitus without complications: Secondary | ICD-10-CM | POA: Insufficient documentation

## 2022-07-15 DIAGNOSIS — I110 Hypertensive heart disease with heart failure: Secondary | ICD-10-CM | POA: Insufficient documentation

## 2022-07-15 DIAGNOSIS — Z79899 Other long term (current) drug therapy: Secondary | ICD-10-CM | POA: Insufficient documentation

## 2022-07-15 DIAGNOSIS — I2510 Atherosclerotic heart disease of native coronary artery without angina pectoris: Secondary | ICD-10-CM | POA: Insufficient documentation

## 2022-07-15 DIAGNOSIS — Z953 Presence of xenogenic heart valve: Secondary | ICD-10-CM | POA: Insufficient documentation

## 2022-07-15 DIAGNOSIS — C3490 Malignant neoplasm of unspecified part of unspecified bronchus or lung: Secondary | ICD-10-CM | POA: Insufficient documentation

## 2022-07-15 DIAGNOSIS — I5042 Chronic combined systolic (congestive) and diastolic (congestive) heart failure: Secondary | ICD-10-CM

## 2022-07-15 DIAGNOSIS — Z7901 Long term (current) use of anticoagulants: Secondary | ICD-10-CM | POA: Insufficient documentation

## 2022-07-15 DIAGNOSIS — I714 Abdominal aortic aneurysm, without rupture, unspecified: Secondary | ICD-10-CM | POA: Insufficient documentation

## 2022-07-15 DIAGNOSIS — E785 Hyperlipidemia, unspecified: Secondary | ICD-10-CM | POA: Insufficient documentation

## 2022-07-15 DIAGNOSIS — I739 Peripheral vascular disease, unspecified: Secondary | ICD-10-CM | POA: Insufficient documentation

## 2022-07-15 LAB — BASIC METABOLIC PANEL
Anion gap: 5 (ref 5–15)
BUN: 19 mg/dL (ref 8–23)
CO2: 27 mmol/L (ref 22–32)
Calcium: 8.9 mg/dL (ref 8.9–10.3)
Chloride: 105 mmol/L (ref 98–111)
Creatinine, Ser: 1.11 mg/dL (ref 0.61–1.24)
GFR, Estimated: >60 mL/min (ref >60)
Glucose, Bld: 109 mg/dL — ABNORMAL HIGH (ref 70–99)
Potassium: 4.5 mmol/L (ref 3.5–5.1)
Sodium: 137 mmol/L (ref 135–145)

## 2022-07-15 LAB — BRAIN NATRIURETIC PEPTIDE: B Natriuretic Peptide: 164.3 pg/mL — ABNORMAL HIGH (ref 0.0–100.0)

## 2022-07-15 MED ORDER — METOPROLOL SUCCINATE ER 25 MG PO TB24: 25.0000 mg | ORAL_TABLET | Freq: Every day | ORAL | 3 refills | Status: DC

## 2022-07-15 MED ORDER — METOPROLOL TARTRATE 25 MG PO TABS: 25.0000 mg | ORAL_TABLET | Freq: Every day | ORAL | 3 refills | Status: DC

## 2022-07-15 MED ORDER — FUROSEMIDE 20 MG PO TABS: 40.0000 mg | ORAL_TABLET | Freq: Two times a day (BID) | ORAL | 3 refills | Status: DC

## 2022-07-15 NOTE — Patient Instructions (Signed)
Change Toprol XL to 25 mg Daily  Labs done today, your results will be available in MyChart, we will contact you for abnormal readings.  Your physician recommends that you schedule a follow-up appointment in: 3 months  If you have any questions or concerns before your next appointment please send [place] a message through [place] or call our office at [phone].    TO LEAVE A MESSAGE FOR THE NURSE SELECT OPTION 2, PLEASE LEAVE A MESSAGE INCLUDING: YOUR NAME DATE OF BIRTH CALL BACK NUMBER REASON FOR CALL**this is important as we prioritize the call backs  YOU WILL RECEIVE A CALL BACK THE SAME DAY AS LONG AS YOU CALL BEFORE 4:00 PM  At the [facility], you and your health needs are our priority. As part of our continuing mission to provide you with exceptional heart care, we have created designated Provider Care Teams. These Care Teams include your primary Cardiologist (physician) and Advanced Practice Providers (APPs- Physician Assistants and Nurse Practitioners) who all work together to provide you with the care you need, when you need it.   You may see any of the following providers on your designated Care Team at your next follow up: Dr Glori Bickers Dr Haynes Kerns, NP Lyda Jester, [place] [facility] [place], [place] Audry Riles, PharmD   Please be sure to bring in all your medications bottles to every appointment.

## 2022-07-15 NOTE — Telephone Encounter (Signed)
Pharmacy called to verify metoprolol  script  Per [place], RN who was assisting Dr Aundra Dubin during clinic confirmed patient does NOT need metoprolol tartrate he will need metoprolol succinate XL 25 mg daily  Script verbally clarified with pharmacy

## 2022-07-15 NOTE — Progress Notes (Signed)
ReDS Vest / Clip - 07/15/22 1200       ReDS Vest / Clip   Station Marker C    Ruler Value 30    ReDS Value Range Moderate volume overload    ReDS Actual Value 37

## 2022-07-16 NOTE — Progress Notes (Signed)
Patient ID: Mcdonald Reiling., male   DOB: 12/25/1931, 88 y.o.   MRN: [id number] PCP: Dr. Elyse Hsu Cardiology: Dr. Aundra Dubin  88 y.o. with history of extensive vascular disease including CAD s/p CABG and redo CABG, AAA s/p endovascular repair in 2015, PAD, and carotid stenosis as well as prior AVR.                            After his AAA repair, he was noted to be in atrial fibrillation.  He had had a prior episode of atrial fibrillation after his cardiac surgery in 2007.  Atrial fibrillation was rate-controlled.  He was started on warfarin and Toprol XL.  He developed exertional dyspnea and fatigue and was cardioverted back to SR 06/06/14.  Echo 06/15 EF worsened to 25-30% with stable bioprosthetic aortic valve, mildly dilated RV with normal systolic function.  [place] 07/15 showed stable anatomy.  The LIMA-LAD was patent, the sequential SVG-OM/ramus/D was patent only to ramus but this is the same as the prior cath, and the SVG-PDA was patent with a long 50-60% mid-graft stenosis.  No intervention.  Of note, at cath he was noted to be back in atrial fibrillation.  He was started on amiodarone and had DCCV again on 08/21/14.  This was successful.  He has been off amiodarone due to side effects (hallucinations).  Echo in 1/16 showed recovery of EF to 55-60%.    He was admitted in 1/19 with bronchitis/wheezing/dyspnea and was found to be in atrial fibrillation with RVR.  He tested positive for metapneumovirus.  Echo showed EF 45-50% with normal  bioprosthetic aortic valve. He was diuresed for volume overload while in the hospital. He had DCCV to NSR.    Carotid dopplers in 1/19 showed 80-99% RICA stenosis, he had right CEA in 3/87 with no complications.   At appointment in early 6/20, Mr Kreis was noted to be back in atrial fibrillation with RVR and mild CHF.  He was started on Toprol XL and Lasix was increased.  He had TEE-guided DCCV to NSR in 6/20.  TEE showed EF 50%, mild diffuse hypokinesis, mildly decreased RV systolic function, normally functioning bioprosthetic aortic valve.   In 2021, he was admitted for Tikosyn initiation.  In 12/21, he went into atrial fibrillation again and was cardioverted back to NSR.   Echo in 1/22 showed EF 45-50%, diffuse hypokinesis, mildly decreased RV systolic function, moderate biatrial enlargement, bioprosthetic valve with mean gradient 8 mmHg.    In 2/22, he went into atrial fibrillation and had TEE-guided DCCV back to NSR.  TEE showed EF 45-50%.    In 3/22, he was back in the ER with atrial fibrillation triggered by [name] most likely.  He had a cardioversion back to NSR.   In 10/22, patient developed urinary retention and an E coli UTI.  In the setting of this, he had a fall at home and was unable to get up due to weakness for hours.  He developed rhabdomyolysis.  This resolved with minimal renal damage with IV fluid hydration and he was discharged to rehab.   Follow up 2/23 he was in atrial fibrillation, TEE/DCCV arranged however INR subtherapeutic. He cancelled rescheduled procedure due to a death in the family.  Seen for f/u 03/06/22. Remained in AF. Volume overloaded. Lasix increased for 3 days. Underwent TEE guided DCCV on 03/19/22.  TEE showed EF 50-55%, mildly decreased RV function, normal bioprosthetic aortic valve, mild MR and  mild mitral stenosis (mean gradient 3 mmHg).    Recently diagnosed with stage IA2 NSCLC RUL. Saw Radiation Oncology and Pulmonary and decided on empiric  treatment with SBRT.  He has completed radiation.   He returns for followup of CHF, PAF, and CAD.  He is living at [facility] now.  Weight down 1 lb.  He is in NSR today.  No chest pain.  He gets short of breath with long walks.  He has been doing to the driving range and putting green.  No orthopnea/PND.  Still very involved with stock trading.   ECG (personally reviewed): NSR, iLBBB, QRS 116 msec, QTc 450 msec  Labs (5/15): K 4.3, creatinine 0.89 Labs (6/15): K 4.3, creatinine 1.4, BNP 112 Labs (7/15): K 4.4, creatinine 1.2, HCT 40.2 Labs (8/15): K 4, creatinine 1.2, BNP 113, LFTs and TSH normal Labs (10/15): K 4.4, creatinine 1.4 Labs (5/16): K 4.7, creatinine 1.18, HDL 52, LDL 98 Labs (6/16): LDL 118, LFTs normal Labs (1/17): HCT 40.2, K 3.7, creatinine 1.04 Labs (1/19): K 4.9, creatinine 1.19 Labs (2/19): K 4.2, creatinine 0.89 Labs (3/19): LDL 86, HDL 45, TGs 245 Labs (5/19): LDL 110, HDL 39 Labs (6/19): K 4.1, creatinine 1.07 Labs (11/19): LDL 14, HDL 56 Labs (6/20): K 4.1, creatinine 1.0 Labs (7/20): K 4.3, creatinine 1.07 Labs (9/21): LDL 129, hgb 14.8, K 4.1, creatinine 1.02 Labs (12/21): LDL 20, HDL 46, K 4.4, creatinine 1.23 Labs (1/22): LDL 45, HDL 49, K 4.3, creatinine 1.1, BNP 184 Labs (3/22): K 4.4, creatinine 1.10, LDL 15, HDL 49 Labs (6/22): K 4.3, creatinine 1.11, LDL 121 Labs (7/22): K 4.3, creatinine 1.23, hgb 14.5 Labs (11/22): K 4.5, creatinine 1.14 Labs (2/23): K 4.3, creatinine 1.16 Labs (7/23): K 4.4, creatinine 1.24  REDS clip 37%  PMH: 1. PAD: Peripheral arterial dopplers in 2012 showed > 75% bilateral SFA stenosis. Peripheral arterial dopplers in 10/14 showed > 50% focal bilateral SFA stenoses.  2. AAA: [place] 4/15 with 4.4 cm AAA but concern for penetrating ulcers. CTA abdomen showed 4.4 cm AAA with penetrating ulcers and possible pseudoaneurysms. Now s/p endovascular repair of AAA in 0/92 with no complications.  3. Carotid stenosis: Carotid dopplers  (4/15) with 60-79% bilateral ICA stenosis. Carotid dopplers (11/15) with 60-79% bilateral ICA stenosis.  Carotid dopplers 6/16 with 60-79% bilateral ICA stenosis. - Carotid dopplers (1/18): 60-79% RICA stenosis.  - Carotid dopplers (1/19): 80-99% RICA stenosis => Right CEA in 2/19.  - Carotid dopplers (11/19): 40-59% bilateral stenosis.  - Carotid dopplers (1/21): 1-39% BICA stenosis.  4. CAD: CABG 1989 with LIMA-LAD, SVG-D, seq SVG-ramus and OM, sequential SVG-PDA/PLV. SVG-PDA/PLV found to be occluded on cath prior to AVR, so patient had SVG-PDA with AVR in 2007. LHC (7/15) with LIMA-LAD patent with 40-50% stenosis in LAD after touchdown, sequential SVG-ramus/OM/diagonal with only the ramus branch still intact (known from prior cath), SVG-PDA from original surgery TO, SVG-PDA from redo surgery with long 50-60% mid-graft stenosis.  No target for intervention.  5. Severe aortic stenosis: Bioprosthetic AVR in 2007.  6. Atrial fibrillation: Paroxysmal. Initially noted after cardiac surgery, then again after AAA repair.  DCCV to NSR 06/06/14.  DCCV to NSR again 08/21/14.  - DCCV to NSR in 1/19.  - DCCV to NSR in 6/20.  - Now on Tikosyn.  - DCCV to NSR 12/21.  - DCCV to NSR 2/22 - DCCV to NSR 3/22 - DCCV to NSR 4/23 7. Type II diabetes  8. Gout  9. GERD  10. Hyperlipidemia  11. HTN 12. GERD  13. Chronic systolic CHF: Echo (6/31) with EF worsened to 25-30% with grade II diastolic dysfunction, stable bioprosthetic aortic valve, mildly dilated RV with normal systolic function.  Echo (1/16) with EF 55-60%, basal inferior hypokinesis, mild LVH, bioprosthetic aortic valve with mean gradient 13 mmHg, mild MR, severe LAE.  - Echo (1/19): EF 45-50%, mild MR, normal bioprosthetic aortic valve.  - TEE (6/20): EF 50% with mild diffuse hypokinesis, mildly decreased RV systolic function with mild RV enlargement, bioprosthetic aortic valve looked ok, mild MR. - Echo (1/22):  EF 45-50%, diffuse hypokinesis, mildly  decreased RV systolic function, moderate biatrial enlargement, bioprosthetic valve with mean gradient 8 mmHg.   - TEE (2/22): EF 45-50%, RV normal, moderate biatrial enlargement, bioprosthetic AoV with mean gradient 6 mmHg, mild MR.  - TEE (4/23): EF 50-55%, mildly decreased RV function, normal bioprosthetic aortic valve, mild MR and mild mitral stenosis (mean gradient 3 mmHg) 14. BPH  15. Nonsmall cell lung cancer: Suspected by imaging, treated in 2023 with radiation.  7. Fall with rhabdomyolysis (10/22)   SH: Widower, 2 children, raised his 3 grand-daughters, lives in [place], [place].  Occasional ETOH, no smoking.   FH: Father died from ruptured AAA.   ROS: All systems reviewed and negative except as per HPI.   Current Outpatient Medications  Medication Sig Dispense Refill   atorvastatin (LIPITOR) 20 MG tablet Take 20 mg by mouth every other day. Alternating with 10 mg     Coenzyme Q10 (CO Q10) 200 MG CAPS Take 200 mg by mouth daily.     dofetilide (TIKOSYN) 250 MCG capsule TAKE ONE CAPSULE TWICE A DAY 60 capsule 11   dorzolamide-timolol (COSOPT) 22.3-6.8 MG/ML ophthalmic solution Place 1 drop into both eyes 2 (two) times daily.     doxazosin (CARDURA) 2 MG tablet Take 2 mg by mouth daily. And 1 mg in the evening     ezetimibe (ZETIA) 10 MG tablet Take 1 tablet (10 mg total) by mouth at bedtime. 90 tablet 3   latanoprost (XALATAN) 0.005 % ophthalmic solution Place 1 drop into the left eye at bedtime.     Misc Natural Products (GLUCOSAMINE CHONDROITIN TRIPLE) TABS Take 1 tablet by mouth daily.     ramipril (ALTACE) 2.5 MG capsule Take 1 capsule (2.5 mg total) by mouth 2 (two) times daily. 90 capsule 4   REPATHA SURECLICK 497 MG/ML SOAJ INJECT 1 PEN INTO SKIN EVERY 14 DAYS 2 mL 11   spironolactone (ALDACTONE) 25 MG tablet Take 1 tablet (25 mg total) by mouth daily. 30 tablet 5   tamsulosin (FLOMAX) 0.4 MG CAPS capsule Take 1 capsule (0.4 mg total) by mouth 2 (two) times daily. 180  capsule 3   warfarin (COUMADIN) 3 MG tablet Take 3 tablets (9mg ) daily or as directed by [facility] 90 tablet 2   furosemide (LASIX) 20 MG tablet Take 2 tablets (40 mg total) by mouth 2 (two) times daily. Please cancel all previous orders for current medication. Change in dosage or pill size. 180 tablet 3   metoprolol succinate (TOPROL-XL) 25 MG 24 hr tablet Take 1 tablet (25 mg total) by mouth daily. 90 tablet 3   No current facility-administered medications for this encounter.   BP 104/60   Pulse (!) 50   Wt 87.6 kg (193 lb 3.2 oz)   SpO2 96%   BMI 30.26 kg/m    Wt Readings from Last 3 Encounters:  07/15/22 87.6 kg (193 lb  3.2 oz)  06/23/22 88 kg (194 lb)  05/05/22 86.2 kg (190 lb)   Physical Exam: General: NAD Neck: JVP 8 cm, no thyromegaly or thyroid nodule.  Lungs: Clear to auscultation bilaterally with normal respiratory effort. CV: Nondisplaced PMI.  Heart regular S1/S2, no S3/S4, 2/6 early SEM RUSB.  1+ ankle edema.  No carotid bruit.  Normal pedal pulses.  Abdomen: Soft, nontender, no hepatosplenomegaly, no distention.  Skin: Intact without lesions or rashes.  Neurologic: Alert and oriented x 3.  Psych: Normal affect. Extremities: No clubbing or cyanosis.  HEENT: Normal.   Assessment/Plan:  1. CAD: s/p CABG and redo CABG.  Given fall in EF, I did a cardiac cath in 7/15 as documented above. Unfortunately, despite significant coronary disease, there were no good interventional targets.  No recent chest pain.  - Continue atorvastatin and Zetia.   - He just started back on Repatha.  - He is on warfarin in setting of stable CAD so does not need ASA 81.   2. Hyperlipidemia: He is taking atorvastatin + Zetia and just started back on Repatha (took at dose 2 days ago).  - Check lipids in 2 months.  3. PAD: Patient denies claudication or foot ulcers. Peripheral arterial dopplers showed > 50% bilateral focal SFA stenoses in the past.  He is not a cilostazol candidate with  CHF. He is followed by Dr. Trula Slade at [facility]. No current claudication symptoms.  - Recommended he arrange f/u at [facility] 4. Carotid stenosis: S/p right CEA in 2/19.  Followed by Dr. Trula Slade at [facility]. Carotid Dopplers completed 11/21-->bilateral ICA without significant stenosis but >50% common carotid stenosis on left. - Needs followup at [facility].  5. AAA: Successful endovascular repair in 5/15.  Followed by Dr. Trula Slade at [facility].  6. Bioprosthetic AVR: Valve stable on 04/23 TEE 7. Atrial fibrillation: Possible CNS side effects with amiodarone.  He is on Tikosyn. He saw EP and was offered ablation but decided against it.  TEE-guided DCCV in 4/23, remains in NSR today. QTc ok on ECG.  - Continue Tikosyn, I do not think he has other anti-arrhythmic options (did not tolerate amiodarone).   - Continue warfarin.  - Continue Toprol XL 25 mg bid.  8. Chronic primarily diastolic CHF: Suspect ischemic cardiomyopathy.  EF 25-30% in 6/15 but had normalized to 55-60% by 1/16.  Down to 45-50% on 1/19 echo but was in the setting of atrial fibrillation.  TEE in 6/20 showed EF 50%. TEE in 2/22 showed EF 45-50%.  TEE 04/23 with EF 50-55%, RV mildly reduced, mean gradient 6 mmHg across aortic valve prosthesis, mild MR. NYHA class II-III symptoms. Mild volume overload by exam and REDs clip.  - Increase Lasix to 40 mg bid.  BMET/BNP today and BMET in 10 days.  - Continue ramipril 2.5 mg bid.   - Continue spironolactone 25 mg daily.  - Off Jardiance with E coli UTI.   - With bradycardia, he can decrease Toprol XL to 25 mg daily.  9. HTN: BP controlled.   10. Diabetes: Jardiance stopped with E coli UTI.  11. OSA: Daytime sleepiness and fatigue. He says that he will not use CPAP so we will defer sleep study.  12. NSCLC RUL: He has completed radiation.   Follow-up: 3 months with APP.   [name]  07/16/2022

## 2022-07-24 ENCOUNTER — Ambulatory Visit (HOSPITAL_COMMUNITY)
Admission: RE | Admit: 2022-07-24 | Discharge: 2022-07-24 | Disposition: A | Discharge status: Home / Self Care | Payer: Self-pay | Source: Ambulatory Visit | Attending: Cardiology | Admitting: Cardiology

## 2022-07-24 DIAGNOSIS — I5042 Chronic combined systolic (congestive) and diastolic (congestive) heart failure: Secondary | ICD-10-CM | POA: Insufficient documentation

## 2022-07-24 LAB — BASIC METABOLIC PANEL
Anion gap: 8 (ref 5–15)
BUN: 27 mg/dL — ABNORMAL HIGH (ref 8–23)
CO2: 26 mmol/L (ref 22–32)
Calcium: 9 mg/dL (ref 8.9–10.3)
Chloride: 104 mmol/L (ref 98–111)
Creatinine, Ser: 1.31 mg/dL — ABNORMAL HIGH (ref 0.61–1.24)
GFR, Estimated: 52 mL/min — ABNORMAL LOW (ref >60)
Glucose, Bld: 124 mg/dL — ABNORMAL HIGH (ref 70–99)
Potassium: 4.5 mmol/L (ref 3.5–5.1)
Sodium: 138 mmol/L (ref 135–145)

## 2022-08-03 NOTE — Progress Notes (Signed)
  Radiation Oncology         [phone]) [phone] ________________________________  Name: Nathaniel [name]. MRN: [id number]  Date: 05/11/2022  DOB: 23-Jul-1932  End of Treatment Note  Diagnosis:   88 yo gentleman with a putative clinical stage IA2 (13 mm) non-small cell carcinoma of the right upper lung.     Indication for treatment:  Curative, Definitive SBRT       Radiation treatment dates:   04/22/22-04/27/22  Site/dose:   The target was treated to 54 Gy in 3 fractions of 18 Gy  Beams/energy:   The patient was treated using stereotactic body radiotherapy according to a 3D conformal radiotherapy plan.  Volumetric arc fields were employed to deliver 6 MV X-rays.  Image guidance was performed with per fraction cone beam CT prior to treatment under personal MD supervision.  Immobilization was achieved using BodyFix Pillow.  Narrative: The patient tolerated radiation treatment relatively well.     Plan: The patient has completed radiation treatment. The patient will return to radiation oncology clinic for routine followup in one month. I advised them to call or return sooner if they have any questions or concerns related to their recovery or treatment. ________________________________  Sheral [name]. Tammi Klippel, M.D.

## 2022-08-04 ENCOUNTER — Telehealth: Payer: Self-pay | Admitting: *Deleted

## 2022-08-04 ENCOUNTER — Other Ambulatory Visit: Payer: Self-pay | Admitting: Urology

## 2022-08-04 DIAGNOSIS — C3411 Malignant neoplasm of upper lobe, right bronchus or lung: Secondary | ICD-10-CM

## 2022-08-04 NOTE — Telephone Encounter (Signed)
CALLED PATIENT TO INFORM OF CT FOR 08-07-22- ARRIVAL TIME - 12:45 PM @ [facility], PATIENT TO HAVE WATER ONLY- 4 HRS. PRIOR TO TEST, PATIENT TO RECEIVE RESULTS FROM ASHLYN BRUNING ON 08-11-22 @ 8:30 AM VIA TELEPHONE, SPOKE WITH PATIENT AND HE IS AWARE OF THESE APPTS. AND THE INSTRUCTIONS.

## 2022-08-05 ENCOUNTER — Ambulatory Visit (INDEPENDENT_AMBULATORY_CARE_PROVIDER_SITE_OTHER): Payer: Self-pay

## 2022-08-05 DIAGNOSIS — Z5181 Encounter for therapeutic drug level monitoring: Secondary | ICD-10-CM

## 2022-08-05 DIAGNOSIS — I4891 Unspecified atrial fibrillation: Secondary | ICD-10-CM

## 2022-08-05 LAB — POCT INR: INR: 2.7 (ref 2.0–3.0)

## 2022-08-05 NOTE — Patient Instructions (Signed)
Description   Continue taking Warfarin 3 tablets daily. Recheck INR in 5 weeks  Coumadin Clinic [phone].

## 2022-08-07 ENCOUNTER — Ambulatory Visit (HOSPITAL_COMMUNITY)
Admission: RE | Admit: 2022-08-07 | Discharge: 2022-08-07 | Disposition: A | Discharge status: Home / Self Care | Payer: Self-pay | Source: Ambulatory Visit | Attending: Urology | Admitting: Urology

## 2022-08-07 DIAGNOSIS — C3411 Malignant neoplasm of upper lobe, right bronchus or lung: Secondary | ICD-10-CM | POA: Insufficient documentation

## 2022-08-07 MED ORDER — IOHEXOL 300 MG/ML  SOLN
100.0000 mL | Freq: Once | INTRAMUSCULAR | Status: AC | PRN
  Administered 2022-08-07: 100 mL via INTRAVENOUS

## 2022-08-07 MED ORDER — SODIUM CHLORIDE (PF) 0.9 % IJ SOLN
INTRAMUSCULAR | Status: AC
  Filled 2022-08-07: qty 50

## 2022-08-10 ENCOUNTER — Telehealth: Payer: Self-pay

## 2022-08-10 NOTE — Telephone Encounter (Signed)
Left message reminder in reference to patient's 8:30am-08/11/22 telephone appointment. I requested that patient return my call [phone] so that I may complete the nursing portion of this appointment.

## 2022-08-11 ENCOUNTER — Ambulatory Visit
Admission: RE | Admit: 2022-08-11 | Discharge: 2022-08-11 | Disposition: A | Discharge status: Home / Self Care | Payer: Self-pay | Source: Ambulatory Visit | Attending: Urology | Admitting: Urology

## 2022-08-11 ENCOUNTER — Encounter: Payer: Self-pay | Admitting: Urology

## 2022-08-11 ENCOUNTER — Telehealth: Payer: Self-pay

## 2022-08-11 DIAGNOSIS — C3411 Malignant neoplasm of upper lobe, right bronchus or lung: Secondary | ICD-10-CM

## 2022-08-11 NOTE — Progress Notes (Signed)
Radiation Oncology         [phone]) [phone] ________________________________  Name: Freddie Apley. MRN: [id number]  Date: 08/11/2022  DOB: 20-Dec-1931  Post Treatment Note  CC: Marin Olp, MD  Marin Olp, MD  Diagnosis:   88 yo gentleman with a putative clinical stage IA2 (13 mm) non-small cell carcinoma of the right upper lung.     Interval Since Last Radiation:  3 months   04/22/22-04/27/22:  The target was treated to 54 Gy in 3 fractions of 18 Gy  Narrative:  I spoke with the patient's son to conduct his routine scheduled follow up visit to review results of his recent post-treatment CT Chest scan via telephone to spare the patient unnecessary potential exposure in the healthcare setting during the current COVID-19 pandemic.  The patient was notified in advance and gave permission to proceed with this visit format. He tolerated radiation treatment well, without complaints.                               On review of systems, obtained from his son, Ulice Dash, the patient remains without complaints. He has not complained of any increased cough, shortness of breath, dyspnea or chest pain. He has not had recent fevers, chills, night sweats or unintentional weight loss. He remains active as a Barista, is quite pleased with his progress to date.  ALLERGIES:  is allergic to grass extracts [gramineae pollens], milk-related compounds, and amiodarone.  Meds: Current Outpatient Medications  Medication Sig Dispense Refill   atorvastatin (LIPITOR) 20 MG tablet Take 20 mg by mouth every other day. Alternating with 10 mg     Coenzyme Q10 (CO Q10) 200 MG CAPS Take 200 mg by mouth daily.     dofetilide (TIKOSYN) 250 MCG capsule TAKE ONE CAPSULE TWICE A DAY 60 capsule 11   dorzolamide-timolol (COSOPT) 22.3-6.8 MG/ML ophthalmic solution Place 1 drop into both eyes 2 (two) times daily.     doxazosin (CARDURA) 2 MG tablet Take 2 mg by mouth daily. And 1 mg in the evening     ezetimibe  (ZETIA) 10 MG tablet Take 1 tablet (10 mg total) by mouth at bedtime. 90 tablet 3   furosemide (LASIX) 20 MG tablet Take 2 tablets (40 mg total) by mouth 2 (two) times daily. Please cancel all previous orders for current medication. Change in dosage or pill size. 180 tablet 3   latanoprost (XALATAN) 0.005 % ophthalmic solution Place 1 drop into the left eye at bedtime.     metoprolol succinate (TOPROL-XL) 25 MG 24 hr tablet Take 1 tablet (25 mg total) by mouth daily. 90 tablet 3   Misc Natural Products (GLUCOSAMINE CHONDROITIN TRIPLE) TABS Take 1 tablet by mouth daily.     ramipril (ALTACE) 2.5 MG capsule Take 1 capsule (2.5 mg total) by mouth 2 (two) times daily. 90 capsule 4   REPATHA SURECLICK 010 MG/ML SOAJ INJECT 1 PEN INTO SKIN EVERY 14 DAYS 2 mL 11   spironolactone (ALDACTONE) 25 MG tablet Take 1 tablet (25 mg total) by mouth daily. 30 tablet 5   tamsulosin (FLOMAX) 0.4 MG CAPS capsule Take 1 capsule (0.4 mg total) by mouth 2 (two) times daily. 180 capsule 3   warfarin (COUMADIN) 3 MG tablet Take 3 tablets (9mg ) daily or as directed by Coumadin Clinic 90 tablet 2   No current facility-administered medications for this encounter.    Physical  Findings:  vitals were not taken for this visit.  Pain Assessment Pain Score: 0-No pain/10 Unable to assess due to telephone follow up visit format.  Lab Findings: Lab Results  Component Value Date   WBC 6.3 06/23/2022   HGB 13.7 06/23/2022   HCT 40.9 06/23/2022   MCV 90.4 06/23/2022   PLT 220.0 06/23/2022     Radiographic Findings: CT Chest W Contrast  Result Date: 08/09/2022 CLINICAL DATA:  Non-small cell lung cancer, assess treatment response. EXAM: CT CHEST WITH CONTRAST TECHNIQUE: Multidetector CT imaging of the chest was performed during intravenous contrast administration. RADIATION DOSE REDUCTION: This exam was performed according to the departmental dose-optimization program which includes automated exposure control, adjustment of  the mA and/or kV according to patient size and/or use of iterative reconstruction technique. CONTRAST:  167mL OMNIPAQUE IOHEXOL 300 MG/ML  SOLN COMPARISON:  PET-CT 03/30/2022. FINDINGS: Cardiovascular: Heart is mildly enlarged. There is no pericardial effusion. Patient is status post aortic valve replacement. Aorta is normal in size. There are atherosclerotic calcifications of the aorta. Mediastinum/Nodes: No enlarged mediastinal, hilar, or axillary lymph nodes. Thyroid gland, trachea, and esophagus demonstrate no significant findings. Lungs/Pleura: Mixed ground-glass and airspace focal opacity in the right upper lobe has increased in size now measuring 2.0 x 4.3 cm (previously 1.2 x 1.1 cm). There is also new patchy ground-glass opacity in the left lung apex image 5/22. There is stable scarring in the lung bases. No new pulmonary nodules. No pleural effusion or pneumothorax. Upper Abdomen: No acute abnormality. Musculoskeletal: Sternotomy wires are present. No acute fracture or focal osseous lesion. IMPRESSION: 1. Mixed ground-glass and airspace focal opacity in the right upper lobe has significantly increased in size now measuring up to 4.3 cm (previously 1.2 cm). Findings may represent worsening neoplasm, infection, and/or hemorrhage. Please correlate clinically. No new separate pulmonary nodules. Aortic Atherosclerosis (ICD10-I70.0). Electronically Signed   By: Ronney Asters M.D.   On: 08/09/2022 15:44    Impression/Plan: 1. 88 yo gentleman with a putative clinical stage IA2 (13 mm) non-small cell carcinoma of the right upper lung. He appears to have recovered well from the effects of his recent SBRT lung and is without complaints. The recent follow up CT Chest scan from 08/07/22 does show some increased inflammation in the treatment field, which is probably exacerbated by his immunotherapy that he is on for his cholesterol. If he is asymptomatic, we don't need to do a thing but if he is having cough and/or  increased shortness of breath, I will need to prescribe steroids to calm it down and we will get a follow up scan in 3 months regardless.    Nicholos Johns, PA-C

## 2022-08-11 NOTE — Progress Notes (Signed)
Telephone appointment. I spoke w/ patient's son Mr. Dontel Harshberger, verified his identity and began nursing interview. No issues reported at this time.  Meaningful use complete.  Reminded patient's son of patient's 8:30am-08/11/22 telephone appointment w/ Ashlyn Bruning PA-C. I left my extension [phone] in case patient needs anything. Son verbalized understanding.  Patient contact [phone]- Son Elius Etheredge

## 2022-08-11 NOTE — Telephone Encounter (Signed)
Called to inquire how patient is feeling post [place] for lung cancer. Left a voicemail w/ my extension [phone] and requested that patient return my call.

## 2022-08-11 NOTE — Telephone Encounter (Signed)
Voicemail reminder for patient's 8:30am-08/11/22 telephone appointment w/ Ashlyn Bruning PA-C. I left my extension [phone] and requested that patient return my call to complete the nursing portion of this appt.

## 2022-08-20 ENCOUNTER — Inpatient Hospital Stay (HOSPITAL_COMMUNITY)
Admission: EM | Admit: 2022-08-20 | Discharge: 2022-08-24 | DRG: 281 | Disposition: A | Discharge status: Home / Self Care | Payer: Medicare Other | Attending: Cardiology | Admitting: Cardiology

## 2022-08-20 ENCOUNTER — Inpatient Hospital Stay (HOSPITAL_COMMUNITY): Payer: Medicare Other

## 2022-08-20 ENCOUNTER — Emergency Department (HOSPITAL_COMMUNITY): Payer: Medicare Other

## 2022-08-20 ENCOUNTER — Encounter (HOSPITAL_COMMUNITY): Payer: Self-pay | Admitting: Internal Medicine

## 2022-08-20 ENCOUNTER — Other Ambulatory Visit: Payer: Self-pay

## 2022-08-20 DIAGNOSIS — I5042 Chronic combined systolic (congestive) and diastolic (congestive) heart failure: Secondary | ICD-10-CM | POA: Diagnosis present

## 2022-08-20 DIAGNOSIS — I252 Old myocardial infarction: Secondary | ICD-10-CM | POA: Diagnosis not present

## 2022-08-20 DIAGNOSIS — Z953 Presence of xenogenic heart valve: Secondary | ICD-10-CM

## 2022-08-20 DIAGNOSIS — E1151 Type 2 diabetes mellitus with diabetic peripheral angiopathy without gangrene: Secondary | ICD-10-CM | POA: Diagnosis present

## 2022-08-20 DIAGNOSIS — Z87891 Personal history of nicotine dependence: Secondary | ICD-10-CM | POA: Diagnosis not present

## 2022-08-20 DIAGNOSIS — Z79899 Other long term (current) drug therapy: Secondary | ICD-10-CM | POA: Diagnosis not present

## 2022-08-20 DIAGNOSIS — I214 Non-ST elevation (NSTEMI) myocardial infarction: Secondary | ICD-10-CM | POA: Diagnosis not present

## 2022-08-20 DIAGNOSIS — N390 Urinary tract infection, site not specified: Secondary | ICD-10-CM | POA: Diagnosis present

## 2022-08-20 DIAGNOSIS — I480 Paroxysmal atrial fibrillation: Secondary | ICD-10-CM | POA: Diagnosis present

## 2022-08-20 DIAGNOSIS — Z923 Personal history of irradiation: Secondary | ICD-10-CM | POA: Diagnosis not present

## 2022-08-20 DIAGNOSIS — I5021 Acute systolic (congestive) heart failure: Secondary | ICD-10-CM

## 2022-08-20 DIAGNOSIS — Z8679 Personal history of other diseases of the circulatory system: Secondary | ICD-10-CM | POA: Diagnosis not present

## 2022-08-20 DIAGNOSIS — Z91011 Allergy to milk products: Secondary | ICD-10-CM

## 2022-08-20 DIAGNOSIS — Z8249 Family history of ischemic heart disease and other diseases of the circulatory system: Secondary | ICD-10-CM

## 2022-08-20 DIAGNOSIS — I25110 Atherosclerotic heart disease of native coronary artery with unstable angina pectoris: Secondary | ICD-10-CM | POA: Diagnosis present

## 2022-08-20 DIAGNOSIS — M109 Gout, unspecified: Secondary | ICD-10-CM | POA: Diagnosis present

## 2022-08-20 DIAGNOSIS — E1122 Type 2 diabetes mellitus with diabetic chronic kidney disease: Secondary | ICD-10-CM | POA: Diagnosis not present

## 2022-08-20 DIAGNOSIS — Z888 Allergy status to other drugs, medicaments and biological substances status: Secondary | ICD-10-CM | POA: Diagnosis not present

## 2022-08-20 DIAGNOSIS — I130 Hypertensive heart and chronic kidney disease with heart failure and stage 1 through stage 4 chronic kidney disease, or unspecified chronic kidney disease: Secondary | ICD-10-CM | POA: Diagnosis present

## 2022-08-20 DIAGNOSIS — N1831 Chronic kidney disease, stage 3a: Secondary | ICD-10-CM | POA: Diagnosis present

## 2022-08-20 DIAGNOSIS — E785 Hyperlipidemia, unspecified: Secondary | ICD-10-CM | POA: Diagnosis present

## 2022-08-20 DIAGNOSIS — I5031 Acute diastolic (congestive) heart failure: Secondary | ICD-10-CM

## 2022-08-20 DIAGNOSIS — C3491 Malignant neoplasm of unspecified part of right bronchus or lung: Secondary | ICD-10-CM | POA: Diagnosis present

## 2022-08-20 DIAGNOSIS — I2510 Atherosclerotic heart disease of native coronary artery without angina pectoris: Secondary | ICD-10-CM | POA: Diagnosis not present

## 2022-08-20 DIAGNOSIS — Z7901 Long term (current) use of anticoagulants: Secondary | ICD-10-CM

## 2022-08-20 DIAGNOSIS — I4891 Unspecified atrial fibrillation: Secondary | ICD-10-CM

## 2022-08-20 DIAGNOSIS — I110 Hypertensive heart disease with heart failure: Secondary | ICD-10-CM | POA: Diagnosis not present

## 2022-08-20 DIAGNOSIS — K219 Gastro-esophageal reflux disease without esophagitis: Secondary | ICD-10-CM | POA: Diagnosis not present

## 2022-08-20 DIAGNOSIS — B9620 Unspecified Escherichia coli [E. coli] as the cause of diseases classified elsewhere: Secondary | ICD-10-CM | POA: Diagnosis not present

## 2022-08-20 DIAGNOSIS — I509 Heart failure, unspecified: Secondary | ICD-10-CM | POA: Diagnosis not present

## 2022-08-20 LAB — CBC
HCT: 43.6 % (ref 39.0–52.0)
Hemoglobin: 14.1 g/dL (ref 13.0–17.0)
MCH: 30.4 pg (ref 26.0–34.0)
MCHC: 32.3 g/dL (ref 30.0–36.0)
MCV: 94 fL (ref 80.0–100.0)
Platelets: 195 10*3/uL (ref 150–400)
RBC: 4.64 MIL/uL (ref 4.22–5.81)
RDW: 12.5 % (ref 11.5–15.5)
WBC: 7.7 10*3/uL (ref 4.0–10.5)
nRBC: 0 % (ref 0.0–0.2)

## 2022-08-20 LAB — ECHOCARDIOGRAM COMPLETE
AR max vel: 2.47 cm2
AV Area VTI: 2.49 cm2
AV Area mean vel: 2.49 cm2
AV Mean grad: 3 mmHg
AV Peak grad: 5.9 mmHg
Ao pk vel: 1.21 m/s
Calc EF: 35.1 %
Height: 70 in
S' Lateral: 3.4 cm
Single Plane A2C EF: 12.7 %
Single Plane A4C EF: 51.8 %
Weight: 3072 oz

## 2022-08-20 LAB — COMPREHENSIVE METABOLIC PANEL
ALT: 18 U/L (ref 0–44)
AST: 52 U/L — ABNORMAL HIGH (ref 15–41)
Albumin: 3.7 g/dL (ref 3.5–5.0)
Alkaline Phosphatase: 46 U/L (ref 38–126)
Anion gap: 11 (ref 5–15)
BUN: 23 mg/dL (ref 8–23)
CO2: 24 mmol/L (ref 22–32)
Calcium: 9.3 mg/dL (ref 8.9–10.3)
Chloride: 101 mmol/L (ref 98–111)
Creatinine, Ser: 1.28 mg/dL — ABNORMAL HIGH (ref 0.61–1.24)
GFR, Estimated: 54 mL/min — ABNORMAL LOW (ref >60)
Glucose, Bld: 235 mg/dL — ABNORMAL HIGH (ref 70–99)
Potassium: 4.8 mmol/L (ref 3.5–5.1)
Sodium: 136 mmol/L (ref 135–145)
Total Bilirubin: 1.1 mg/dL (ref 0.3–1.2)
Total Protein: 6.9 g/dL (ref 6.5–8.1)

## 2022-08-20 LAB — BRAIN NATRIURETIC PEPTIDE: B Natriuretic Peptide: 704.2 pg/mL — ABNORMAL HIGH (ref 0.0–100.0)

## 2022-08-20 LAB — HEPARIN LEVEL (UNFRACTIONATED): Heparin Unfractionated: 0.19 IU/mL — ABNORMAL LOW (ref 0.30–0.70)

## 2022-08-20 LAB — PROTIME-INR
INR: 1.8 — ABNORMAL HIGH (ref 0.8–1.2)
Prothrombin Time: 21 seconds — ABNORMAL HIGH (ref 11.4–15.2)

## 2022-08-20 LAB — TROPONIN I (HIGH SENSITIVITY)
Troponin I (High Sensitivity): 3666 ng/L (ref <18)
Troponin I (High Sensitivity): 3866 ng/L (ref <18)

## 2022-08-20 LAB — MAGNESIUM: Magnesium: 2 mg/dL (ref 1.7–2.4)

## 2022-08-20 MED ORDER — ATORVASTATIN CALCIUM 10 MG PO TABS: 10.0000 mg | ORAL_TABLET | ORAL | Status: DC

## 2022-08-20 MED ORDER — ASPIRIN 81 MG PO TBEC
81.0000 mg | DELAYED_RELEASE_TABLET | Freq: Every day | ORAL | Status: DC
  Administered 2022-08-22 – 2022-08-23 (×2): 81 mg via ORAL
  Filled 2022-08-20 (×3): qty 1

## 2022-08-20 MED ORDER — SODIUM CHLORIDE 0.9% FLUSH: 3.0000 mL | Freq: Two times a day (BID) | INTRAVENOUS | Status: DC

## 2022-08-20 MED ORDER — TAMSULOSIN HCL 0.4 MG PO CAPS
0.4000 mg | ORAL_CAPSULE | Freq: Two times a day (BID) | ORAL | Status: DC
  Administered 2022-08-20 – 2022-08-24 (×8): 0.4 mg via ORAL
  Filled 2022-08-20 (×8): qty 1

## 2022-08-20 MED ORDER — ATORVASTATIN CALCIUM 10 MG PO TABS
10.0000 mg | ORAL_TABLET | ORAL | Status: DC
  Administered 2022-08-21: 10 mg via ORAL
  Filled 2022-08-20: qty 1

## 2022-08-20 MED ORDER — SODIUM CHLORIDE 0.9 % IV SOLN: 250.0000 mL | INTRAVENOUS | Status: DC | PRN

## 2022-08-20 MED ORDER — SPIRONOLACTONE 25 MG PO TABS
25.0000 mg | ORAL_TABLET | Freq: Every day | ORAL | Status: DC
  Administered 2022-08-21: 25 mg via ORAL
  Filled 2022-08-20: qty 1

## 2022-08-20 MED ORDER — EZETIMIBE 10 MG PO TABS
10.0000 mg | ORAL_TABLET | Freq: Every day | ORAL | Status: DC
  Administered 2022-08-20 – 2022-08-23 (×4): 10 mg via ORAL
  Filled 2022-08-20 (×6): qty 1

## 2022-08-20 MED ORDER — SODIUM CHLORIDE 0.9% FLUSH
3.0000 mL | Freq: Two times a day (BID) | INTRAVENOUS | Status: DC
  Administered 2022-08-21 – 2022-08-23 (×3): 3 mL via INTRAVENOUS

## 2022-08-20 MED ORDER — ASPIRIN 81 MG PO CHEW
324.0000 mg | CHEWABLE_TABLET | Freq: Once | ORAL | Status: AC
Start: 2022-08-20 — End: 2022-08-20
  Administered 2022-08-20: 324 mg via ORAL
  Filled 2022-08-20: qty 4

## 2022-08-20 MED ORDER — ACETAMINOPHEN 325 MG PO TABS: 650.0000 mg | ORAL_TABLET | ORAL | Status: DC | PRN

## 2022-08-20 MED ORDER — HEPARIN (PORCINE) 25000 UT/250ML-% IV SOLN
1300.0000 [IU]/h | INTRAVENOUS | Status: DC
  Administered 2022-08-20: 1050 [IU]/h via INTRAVENOUS
  Filled 2022-08-20: qty 250

## 2022-08-20 MED ORDER — METOPROLOL SUCCINATE ER 25 MG PO TB24
25.0000 mg | ORAL_TABLET | Freq: Every day | ORAL | Status: DC
  Filled 2022-08-20: qty 1

## 2022-08-20 MED ORDER — HEPARIN (PORCINE) 25000 UT/250ML-% IV SOLN: 12.0000 [IU]/kg/h | INTRAVENOUS | Status: DC

## 2022-08-20 MED ORDER — HEPARIN SODIUM (PORCINE) 5000 UNIT/ML IJ SOLN: 4000.0000 [IU] | Freq: Once | INTRAMUSCULAR | Status: DC

## 2022-08-20 MED ORDER — ATORVASTATIN CALCIUM 10 MG PO TABS
20.0000 mg | ORAL_TABLET | ORAL | Status: DC
  Administered 2022-08-20: 20 mg via ORAL
  Filled 2022-08-20: qty 2

## 2022-08-20 MED ORDER — DOFETILIDE 250 MCG PO CAPS
250.0000 ug | ORAL_CAPSULE | Freq: Two times a day (BID) | ORAL | Status: DC
  Administered 2022-08-20 – 2022-08-24 (×9): 250 ug via ORAL
  Filled 2022-08-20 (×9): qty 1

## 2022-08-20 MED ORDER — RAMIPRIL 1.25 MG PO CAPS: 2.5000 mg | ORAL_CAPSULE | Freq: Two times a day (BID) | ORAL | Status: DC

## 2022-08-20 MED ORDER — SODIUM CHLORIDE 0.9% FLUSH: 3.0000 mL | INTRAVENOUS | Status: DC | PRN

## 2022-08-20 NOTE — Progress Notes (Signed)
ANTICOAGULATION CONSULT NOTE - Initial Consult  Pharmacy Consult for Heparin Indication: chest pain/ACS, afib  Allergies  Allergen Reactions   Grass Extracts [Gramineae Pollens] Other (See Comments)    Sneezing and runny nose   Milk-Related Compounds Other (See Comments)    Just Yogurt.  Makes him very lethargic.   Amiodarone Anxiety    depression    Patient Measurements: Height: 5\' 10"  (177.8 cm) Weight: 84.7 kg (186 lb 11.2 oz) IBW/kg (Calculated) : 73 Heparin Dosing Weight: 87 kg  Vital Signs: Temp: 97.7 F (36.5 C) (09/07 2144) Temp Source: Oral (09/07 2144) BP: 118/105 (09/07 2144) Pulse Rate: 115 (09/07 2144)  Labs: Recent Labs    08/20/22 1000 08/20/22 1212 08/20/22 2151  HGB 14.1  --   --   HCT 43.6  --   --   PLT 195  --   --   LABPROT  --  21.0*  --   INR  --  1.8*  --   HEPARINUNFRC  --   --  0.19*  CREATININE 1.28*  --   --   TROPONINIHS 3,666* 3,866*  --      Estimated Creatinine Clearance: 41.2 mL/min (A) (by C-G formula based on SCr of 1.28 mg/dL (H)).   Medical History: Past Medical History:  Diagnosis Date   AAA (abdominal aortic aneurysm) (HCC)    Atrial fibrillation (HCC)    Atrial fibrillation (HCC)    CAD (coronary artery disease)    Cataract    CHF (congestive heart failure) (HCC)    Chronic kidney disease    Colon polyps    adenomatous   Diabetes mellitus    Diverticulosis    Dysrhythmia    A-Fib   GERD (gastroesophageal reflux disease)    Gout    Hernia, abdominal    History of aortic valve replacement with bioprosthetic valve    2007   Hx: UTI (urinary tract infection)    Hyperlipemia    Hypertension    Internal hemorrhoids    Peripheral vascular disease (HCC)    Shortness of breath    with exertion    Medications:  See electronic med rec  Assessment: 88 y.o. M presents with ACS and thinks in afib. Pt on warfarin PTA for afib. Admission INR 1.8. CBC ok on admission. Starting heparin gtt while being worked up  for ACS.  Initial HL 0.19 (subtherapeutic)  Goal of Therapy:  Heparin level 0.3-0.7 units/ml Monitor platelets by anticoagulation protocol: Yes   Plan:  Increase Heparin gtt to 1300 units/hr Will f/u heparin level in 8 hours Daily heparin level, INR, and CBC  Alanda Slim, PharmD, [facility] Clinical Pharmacist Please see AMION for all Pharmacists' Contact Phone Numbers 08/20/2022, 10:30 PM

## 2022-08-20 NOTE — ED Triage Notes (Signed)
Pt. Stated, Donnald Garre been getting SOB and it seems like its getting worse. I put another persons watch that tells you and it says Im in AFib

## 2022-08-20 NOTE — ED Provider Triage Note (Signed)
Emergency Medicine Provider Triage Evaluation Note  Nathaniel [name]. , a 88 y.o. male  was evaluated in triage.  Pt complains of shortness of breath. States that he began feeling short of breath this morning and someone he was with gave him their smart watch and it said he was in afib. Hx of same, anticoagulated with warfarin and compliant with same. Denies chest pain, leg pain, or leg swelling.  Review of Systems  Positive:  Negative:   Physical Exam  BP (!) 131/116 (BP Location: Left Arm)   Pulse 68   Temp 97.8 F (36.6 C) (Oral)   Resp 18   Ht 5\' 10"  (1.778 m)   Wt 87.1 kg   SpO2 98%   BMI 27.55 kg/m  Gen:   Awake, no distress   Resp:  Normal effort  MSK:   Moves extremities without difficulty  Other:    Medical Decision Making  Medically screening exam initiated at 9:46 AM.  Appropriate orders placed.  Nathaniel Gurry [name]. was informed that the remainder of the evaluation will be completed by another provider, this initial triage assessment does not replace that evaluation, and the importance of remaining in the ED until their evaluation is complete.  EKG HR 115   Nestor Lewandowsky 08/20/22 5320

## 2022-08-20 NOTE — H&P (Addendum)
Advanced Heart Failure Team History and Physical Note   PCP:  Marin Olp, MD  PCP-Cardiology: Dr. Aundra Dubin     Reason for Admission: NSTEMI, Acute on Chronic Diastolic Heart Failure and Atrial Fibrillation    HPI:    88 y.o. male with history of extensive vascular disease including CAD s/p CABG and redo CABG w/ AVR, AAA s/p endovascular repair in 2015, PAD, carotid stenosis s/p Rt CEA, chronic systolic/diastolic heart failure as well as difficult to control paroxsymal atrial fibrillation. Multiple cardioversion's. Previously failed amiodarone due to side effects (hallucinations). He has been on Tikosys but c/w breakthrough Afib. Most recently underwent TEE guided DCCV on 03/19/22.  TEE showed EF 50-55%, mildly decreased RV function, normal bioprosthetic aortic valve, mild MR and mild mitral stenosis (mean gradient 3 mmHg).  He saw EP and was offered ablation but decided against it.  Recently diagnosed with stage IA2 NSCLC RUL. Saw Radiation Oncology and Pulmonary and decided on empiric treatment with SBRT.  He has completed radiation.   He f/u CT scan 8/25 which showed mixed ground-glass and airspace focal opacity in the right upper Lobe, significantly increased in size now measuring up to 4.3 cm (previously 1.2 cm). Per provider note, findings felt related to inflammation. Given lack of symptoms, plans were made for repeat chest CT in 3 months.   Recently seen in [facility] for routine f/u on 8/2. EKG at visit showed NSR. He had mild volume overload w/ NYHA Class II-early III symptoms. ReDs 37%. Lasix increased to 40 mg bid. No longer on SGLT2i given h/o urinary retention and E coli UTi.  Now presents to the ED w/ complaints of dyspnea. Prior to arriving, he had used an apple watch which detected afib. In ED, EKG confirmed Afib 79 bpm, w/ repolarization abnormalities suggestive of severe global ischemia (LM/MVD), QTc ok at 431 ms. Hs trop elevated [id number]. He has been started on IV  heparin. BNP elevated at 704. Remainder of labs unremarkable. WBC 7.7, Hgb 14.1, Na 136, K 4.8, Mg 2.0, CO2 24, SCr 1.28, INR 1.8, AST mildly elevated 52.  CXR shows right apical masslike density, as seen on recent CT and PET CT. No frank edema.   He reports he started to notice progressive exertional dyspnea ~1 wk ago + 3 day history of new CP c/w unstable angina. Intermittent. Substernal chest pressure, exacerbated by exertion. He is currently CP free on IV heparin.    Of note, last LHC was 06/2014 which showed significant coronary disease w/ no good interventional targets (LIMA-LAD patent with 40-50% stenosis in LAD after touchdown, sequential SVG-ramus/OM/diagonal with only the ramus branch still intact (known from prior cath), SVG-PDA from original surgery TO, SVG-PDA from redo surgery with long 50-60% mid-graft stenosis).   We discussed CODE status. Wishes to remain Full CODE.    Review of Systems: [y] = yes, [ ]  = no   General: Weight gain [ ] ; Weight loss [ ] ; Anorexia [ ] ; Fatigue [ ] ; Fever [ ] ; Chills [ ] ; Weakness [ ]   Cardiac: Chest pain/pressure [ Y]; Resting SOB [Y ]; Exertional SOB [ Y]; Orthopnea [ ] ; Pedal Edema [ Y]; Palpitations [ ] ; Syncope [ ] ; Presyncope [ ] ; Paroxysmal nocturnal dyspnea[ ]   Pulmonary: Cough [ ] ; Wheezing[ ] ; Hemoptysis[ ] ; Sputum [ ] ; Snoring [ ]   GI: Vomiting[ ] ; Dysphagia[ ] ; Melena[ ] ; Hematochezia [ ] ; Heartburn[ ] ; Abdominal pain [ ] ; Constipation [ ] ; Diarrhea [ ] ; BRBPR [ ]   GU: Hematuria[ ] ;  Dysuria [ ] ; Nocturia[ ]   Vascular: Pain in legs with walking [ ] ; Pain in feet with lying flat [ ] ; Non-healing sores [ ] ; Stroke [ ] ; TIA [ ] ; Slurred speech [ ] ;  Neuro: Headaches[ ] ; Vertigo[ ] ; Seizures[ ] ; Paresthesias[ ] ;Blurred vision [ ] ; Diplopia [ ] ; Vision changes [ ]   Ortho/Skin: Arthritis [ ] ; Joint pain [ ] ; Muscle pain [ ] ; Joint swelling [ ] ; Back Pain [ ] ; Rash [ ]   Psych: Depression[ ] ; Anxiety[ ]   Heme: Bleeding problems [ ] ; Clotting  disorders [ ] ; Anemia [ ]   Endocrine: Diabetes [ ] ; Thyroid dysfunction[ ]    Home Medications Prior to Admission medications   Medication Sig Start Date End Date Taking? Authorizing Provider  atorvastatin (LIPITOR) 20 MG tablet Take 20 mg by mouth every other day. Alternating with 10 mg    [provider]  Coenzyme Q10 (CO Q10) 200 MG CAPS Take 200 mg by mouth daily.    [provider]  dofetilide (TIKOSYN) 250 MCG capsule TAKE ONE CAPSULE TWICE A DAY 07/06/22   Larey Dresser, MD  dorzolamide-timolol (COSOPT) 22.3-6.8 MG/ML ophthalmic solution Place 1 drop into both eyes 2 (two) times daily.    [provider]  doxazosin (CARDURA) 2 MG tablet Take 2 mg by mouth daily. And 1 mg in the evening    [provider]  ezetimibe (ZETIA) 10 MG tablet Take 1 tablet (10 mg total) by mouth at bedtime. 02/19/22   Marin Olp, MD  furosemide (LASIX) 20 MG tablet Take 2 tablets (40 mg total) by mouth 2 (two) times daily. Please cancel all previous orders for current medication. Change in dosage or pill size. 07/15/22   Larey Dresser, MD  latanoprost (XALATAN) 0.005 % ophthalmic solution Place 1 drop into the left eye at bedtime.    [provider]  metoprolol succinate (TOPROL-XL) 25 MG 24 hr tablet Take 1 tablet (25 mg total) by mouth daily. 07/15/22   Larey Dresser, MD  Misc Natural Products (GLUCOSAMINE CHONDROITIN TRIPLE) TABS Take 1 tablet by mouth daily.    [provider]  ramipril (ALTACE) 2.5 MG capsule Take 1 capsule (2.5 mg total) by mouth 2 (two) times daily. 03/27/22   Larey Dresser, MD  REPATHA SURECLICK 371 MG/ML SOAJ INJECT 1 PEN INTO SKIN EVERY 14 DAYS 02/18/22   Larey Dresser, MD  spironolactone (ALDACTONE) 25 MG tablet Take 1 tablet (25 mg total) by mouth daily. 04/09/22   Joette Catching, PA-C  tamsulosin (FLOMAX) 0.4 MG CAPS capsule Take 1 capsule (0.4 mg total) by mouth 2 (two) times daily. 02/19/22   Marin Olp, MD   warfarin (COUMADIN) 3 MG tablet Take 3 tablets (9mg ) daily or as directed by [facility] 04/16/22   Constance Haw, MD    Past Medical History: Past Medical History:  Diagnosis Date   AAA (abdominal aortic aneurysm) ([place])    Atrial fibrillation ([place])    Atrial fibrillation ([place])    CAD (coronary artery disease)    Cataract    CHF (congestive heart failure) (HCC)    Chronic kidney disease    Colon polyps    adenomatous   Diabetes mellitus    Diverticulosis    Dysrhythmia    A-Fib   GERD (gastroesophageal reflux disease)    Gout    Hernia, abdominal    History of aortic valve replacement with bioprosthetic valve    2007   Hx: UTI (  urinary tract infection)    Hyperlipemia    Hypertension    Internal hemorrhoids    Peripheral vascular disease (HCC)    Shortness of breath    with exertion    Past Surgical History: Past Surgical History:  Procedure Laterality Date   ABDOMINAL AORTIC ENDOVASCULAR STENT GRAFT N/A 04/13/2014   Procedure: ABDOMINAL AORTIC ENDOVASCULAR STENT GRAFT;  Surgeon: Serafina Mitchell, MD;  Location: [place];  Service: Vascular;  Laterality: N/A;   ANGIOPLASTY     Unsure if he had stents put in    [place] N/A 06/06/2014   Procedure: CARDIOVERSION;  Surgeon: Larey Dresser, MD;  Location: [place];  Service: Cardiovascular;  Laterality: N/A;   CARDIOVERSION N/A 08/21/2014   Procedure: CARDIOVERSION;  Surgeon: Larey Dresser, MD;  Location: [place];  Service: Cardiovascular;  Laterality: N/A;   CARDIOVERSION N/A 12/22/2017   Procedure: CARDIOVERSION;  Surgeon: Larey Dresser, MD;  Location: [facility] ENDOSCOPY;  Service: Cardiovascular;  Laterality: N/A;   CARDIOVERSION N/A 05/25/2019   Procedure: CARDIOVERSION;  Surgeon: Larey Dresser, MD;  Location: [place];  Service: Cardiovascular;  Laterality: N/A;   CARDIOVERSION N/A 06/14/2019   Procedure: CARDIOVERSION;  Surgeon: Jerline Pain, MD;   Location: [facility] ENDOSCOPY;  Service: Cardiovascular;  Laterality: N/A;   CARDIOVERSION N/A 12/12/2020   Procedure: CARDIOVERSION;  Surgeon: Larey Dresser, MD;  Location: [facility] ENDOSCOPY;  Service: Cardiovascular;  Laterality: N/A;   CARDIOVERSION N/A 01/29/2021   Procedure: CARDIOVERSION;  Surgeon: Larey Dresser, MD;  Location: [facility] ENDOSCOPY;  Service: Cardiovascular;  Laterality: N/A;   CARDIOVERSION N/A 03/19/2022   Procedure: CARDIOVERSION;  Surgeon: Larey Dresser, MD;  Location: [facility] ENDOSCOPY;  Service: Cardiovascular;  Laterality: N/A;   CORONARY ARTERY BYPASS GRAFT     ENDARTERECTOMY Right 02/02/2018   Procedure: ENDARTERECTOMY CAROTID RIGHT;  Surgeon: Serafina Mitchell, MD;  Location: [facility] OR;  Service: Vascular;  Laterality: Right;   EYE SURGERY Bilateral    cataract surgery   HERNIA REPAIR     2 operations   LEFT HEART CATHETERIZATION WITH CORONARY/GRAFT ANGIOGRAM N/A 07/10/2014   Procedure: LEFT HEART CATHETERIZATION WITH Beatrix Fetters;  Surgeon: Larey Dresser, MD;  Location: [facility] CATH LAB;  Service: Cardiovascular;  Laterality: N/A;   TEE WITHOUT CARDIOVERSION N/A 05/25/2019   Procedure: TRANSESOPHAGEAL ECHOCARDIOGRAM (TEE);  Surgeon: Larey Dresser, MD;  Location: [facility] ENDOSCOPY;  Service: Cardiovascular;  Laterality: N/A;   TEE WITHOUT CARDIOVERSION N/A 01/29/2021   Procedure: TRANSESOPHAGEAL ECHOCARDIOGRAM (TEE);  Surgeon: Larey Dresser, MD;  Location: [facility] ENDOSCOPY;  Service: Cardiovascular;  Laterality: N/A;   TEE WITHOUT CARDIOVERSION N/A 03/19/2022   Procedure: TRANSESOPHAGEAL ECHOCARDIOGRAM (TEE);  Surgeon: Larey Dresser, MD;  Location: [facility] ENDOSCOPY;  Service: Cardiovascular;  Laterality: N/A;   TONSILLECTOMY     VIDEO BRONCHOSCOPY Bilateral 12/25/2015   Procedure: VIDEO BRONCHOSCOPY WITHOUT FLUORO;  Surgeon: Javier Glazier, MD;  Location: [place];  Service: Cardiopulmonary;  Laterality: Bilateral;    Family History:  Family History  Problem Relation Age of  Onset   Heart disease Mother    Heart disease Father        before age 38   AAA (abdominal aortic aneurysm) Father    Colon cancer Neg Hx     Social History: Social History   Socioeconomic History   Marital status: Widowed    Spouse name: Not on file   Number of children:  Not on file   Years of education: Not on file   Highest education level: Not on file  Occupational History   Occupation: [employer]   Tobacco Use   Smoking status: Former    Packs/day: 0.25    Years: 20.00    Total pack years: 5.00    Types: Cigarettes    Quit date: 03/23/1992    Years since quitting: 30.4    Passive exposure: Past   Smokeless tobacco: Never  Vaping Use   Vaping Use: Never used  Substance and Sexual Activity   Alcohol use: Yes    Alcohol/week: 5.0 standard drinks of alcohol    Types: 5 Glasses of wine per week   Drug use: No   Sexual activity: Not Currently  Other Topics Concern   Not on file  Social History Narrative   Widowed (lives alone at [place])- wife died at 34 (cancer at 102). Daughter 75, son 21 in 2023. 5 grandkids. 5 greatgrandkids.    -when patient were 10- started caring for 3 of his grandkids when daughter was not in a good place in the airforce.       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       Hobbies: golf, enjoys football and basketball at [place] Strain: Low Risk  (10/13/2021)   Overall Financial Resource Strain (CARDIA)    Difficulty of Paying Living Expenses: Not very hard  Food Insecurity: No Food Insecurity (10/13/2021)   Hunger Vital Sign    Worried About Running Out of Food in the Last Year: Never true    Ran Out of Food in the Last Year: Never true  Transportation Needs: No Transportation Needs (10/13/2021)   PRAPARE - Hydrologist (Medical): No    Lack of Transportation (Non-Medical): No  Physical Activity: Not on file  Stress: Not on file  Social  Connections: Not on file    Allergies:  Allergies  Allergen Reactions   Grass Extracts [Gramineae Pollens] Other (See Comments)    Sneezing and runny nose   Milk-Related Compounds Other (See Comments)    Just Yogurt.  Makes him very lethargic.   Amiodarone Anxiety    depression    Objective:    Vital Signs:   Temp:  [97.8 F (36.6 C)] 97.8 F (36.6 C) (09/07 0945) Pulse Rate:  [68-90] 90 (09/07 1200) Resp:  [18-21] 21 (09/07 1200) BP: (106-131)/(71-116) 116/71 (09/07 1200) SpO2:  [97 %-99 %] 97 % (09/07 1200) Weight:  [87.1 kg] 87.1 kg (09/07 0940)   Filed Weights   08/20/22 0940  Weight: 87.1 kg     Physical Exam     General:  Elderly WM well appearing. No respiratory difficulty HEENT: Normal Neck: Supple. JVD 8 cm. Carotids 2+ bilat; no bruits. No lymphadenopathy or thyromegaly appreciated. Cor: PMI nondisplaced. Irregularly irregular rhythm. No rubs, gallops or murmurs. Lungs: Clear Abdomen: obese, Mildly distended, soft, nontender. No hepatosplenomegaly. No bruits or masses. Good bowel sounds. Extremities: No cyanosis, clubbing, rash, trace b/l ankle edema Neuro: Alert & oriented x 3, cranial nerves grossly intact. moves all 4 extremities w/o difficulty. Affect pleasant.   Telemetry   Afib w/ CVR, 70s   EKG   Afib 79 bpm, w/ repolarization abnormalities suggestive of severe global ischemia (LM/MVD), QTc ok at 431 ms  Labs     Basic Metabolic Panel: Recent Labs  Lab 08/20/22 1000  NA 136  K 4.8  CL 101  CO2 24  GLUCOSE 235*  BUN 23  CREATININE 1.28*  CALCIUM 9.3    Liver Function Tests: Recent Labs  Lab 08/20/22 1000  AST 52*  ALT 18  ALKPHOS 46  BILITOT 1.1  PROT 6.9  ALBUMIN 3.7   No results for input(s): "LIPASE", "AMYLASE" in the last 168 hours. No results for input(s): "AMMONIA" in the last 168 hours.  CBC: Recent Labs  Lab 08/20/22 1000  WBC 7.7  HGB 14.1  HCT 43.6  MCV 94.0  PLT 195    Cardiac Enzymes: No results  for input(s): "CKTOTAL", "CKMB", "CKMBINDEX", "TROPONINI" in the last 168 hours.  BNP: BNP (last 3 results) Recent Labs    02/10/22 1259 03/06/22 1400 07/15/22 1206  BNP 209.8* 172.0* 164.3*    ProBNP (last 3 results) No results for input(s): "PROBNP" in the last 8760 hours.   CBG: No results for input(s): "GLUCAP" in the last 168 hours.  Coagulation Studies: No results for input(s): "LABPROT", "INR" in the last 72 hours.  Imaging: DG Chest 2 View  Result Date: 08/20/2022 CLINICAL DATA:  Shortness of breath, chest tightness EXAM: CHEST - 2 VIEW COMPARISON:  02/20/2021.  PET CT 03/30/2022.  Chest CT 08/07/2022. FINDINGS: Prior CABG. Heart is normal size. Right apical density again noted as seen on recent PET CT and chest CT. No confluent opacity on the left. Mild elevation of the right hemidiaphragm. No effusions or acute bony abnormality. IMPRESSION: Right apical masslike density again noted as seen on recent CT and PET CT. Prior CABG. Electronically Signed   By: Rolm Baptise M.D.   On: 08/20/2022 10:14     Patient Profile   88 y.o. male with history of extensive vascular disease including CAD s/p CABG and redo CABG w/ AVR, AAA s/p endovascular repair in 2015, PAD, carotid stenosis s/p Rt CEA, chronic systolic and diastolic heart failure as well as difficult to control paroxsymal atrial fibrillation, recently diagnosed with stage IA2 NSCLC RUL treated w/ SBRT, presenting w/ unstable angina, ruled in for NSTEMI. Back in Afib w/ CVR.   Assessment/Plan   NSTEMI, Known CAD  - s/p CABG and Redo CABG  - Last LHC 06/2014 showed significant coronary disease w/ no good interventional targets (LIMA-LAD patent with 40-50% stenosis in LAD after touchdown, sequential SVG-ramus/OM/diagonal with only the ramus branch still intact (known from prior cath), SVG-PDA from original surgery TO, SVG-PDA from redo surgery with long 50-60% mid-graft stenosis).  - 3 day h/o new CP c/w [place] - Hs trop  [phone] - ACS protocol>>continue IV heparin, received 324 mg dose of ASA in ED  - 81 mg ASA daily starting tomorrow  - continue Atorva 20 QOD + Zetia. Continue Repatha at d/c  - continue home Toprol XL  - 2D echo  - NPO at midnight for [facility]   2.  Acute on Heart Failure, H/o HFrEF>>HFimEF  - Echo 6/15 EF 30-35% - Echo 1/16 EF normalized to 55-60% - Echo 1/19 EF 45-50% (in setting of afib) - TEE 6/20 EF 50% - TEE 2/22 EF 45-50% - TEE 4/23 EF 50-55%, RV mildly reduced  - recent progression of dyspnea, but suspect combination of coronary ischemia + mild fluid overload on exam - BNP 704 but no frank edema on CXR, will check ReDs. Suspect he will need another dose of IV Lasix tonight - Check 2D echo  - Continue ramipril 2.5 mg bid.   - Continue spironolactone 25 mg daily.  -  Off Jardiance with E coli UTI.  - Continue Toprol XL  25 mg daily  - no unna boots w/ PAD   3. PAF  - difficult to control, failed multiple cardioversoins - failed amiodarone due to CNS SE - on Tikosyn but w/ breakthrough afib - offered ablation by EP but he declined  - s/p DCCV 4/23>>NSR - back in Afib on admit, CVR 70s. QTc ok at 431 ms  - hold coumadin for cath, INR 1.8. Continue heparin gtt  - continue Tikosyn for now. Follow QTc. Monitor K and Mg  - reattempt DCCV after cath if no spontaneous conversion   4. NSCLC RUL  - completed SBRT  - followed by radiation oncology  - recent f/u CT 8/23 w/ signs of  disease progression vs ? Inflammation  - CXR also w/ increased size of RUL opacity - will ask Dr. Valeta Harms to review images, to help guide how aggressive we should be in treating his current cardiac issues. If concern for disease progression then best approach may be palliative    5. HTN - controlled on current regimen - continue GDMT/home regimen per above   6. Bioprosthetic AVR - Valve stable on 04/23 TEE - Update 2D echo   7. HLD  - LDL Goal < 55  - on Repatha, Atorva QOD + Zetia - check  FLP in AM   8. Type 2DM - recent A1c 6.9  - SSI  - off SLGT2i given UTIs   9. PAD: - AAA s/p EVR, LE PAD + Carotid Artery Disease s/p Rt CEA  - No current claudication symptoms.  - followed by Dr. Trula Slade  - continue ASA, lipid lower agents per above   10. CODE STATUS - discussed code status - Full code for now   Lyda Jester, PA-C 08/20/2022, 12:59 PM  Advanced Heart Failure Team Pager [phone] (M-F; 7a - 5p)  Please contact [facility] for night-coverage after hours (4p -7a ) and weekends on [email or web address]  Patient seen and examined with the above-signed Advanced Practice Provider and/or Housestaff. I personally reviewed laboratory data, imaging studies and relevant notes. I independently examined the patient and formulated the important aspects of the plan. I have edited the note to reflect any of my changes or salient points. I have personally discussed the plan with the patient and/or family.  88 y/o male with PAF (on Tikosyn), CAD s/p re-do CABG, PAD, DM2, HFmrEF.  Presented to ED with several days of progressive exertional angina. Found to be in AF with CVR.   Hstrop 3600 -> 3800. No current CP. ECG nonspecific ST-Ts. Echo today EF 45-50%  Currently feeling better. No CP  INR 1.8  General: Elderly . No resp difficulty HEENT: normal Neck: supple. no JVD. Carotids 2+ bilat; no bruits. No lymphadenopathy or thryomegaly appreciated. Cor: PMI nondisplaced. Irregular rate & rhythm. No rubs, gallops or murmurs. Lungs: clear Abdomen: soft, nontender, nondistended. No hepatosplenomegaly. No bruits or masses. Good bowel sounds. Extremities: no cyanosis, clubbing, rash, edema Neuro: alert & orientedx3, cranial nerves grossly intact. moves all 4 extremities w/o difficulty. Affect pleasant  He has recurrent AF in setting of NSTEMI. Currently CP free. Hstrop flat. On heparin/ASA. Will plan R/L cath tomorrow INR permitting. Continue Tikosyn, if remains in AF will need TEE and  DC-CV prior to d/c. D/w Dr. Aundra Dubin.   Glori Bickers, MD  5:44 PM

## 2022-08-20 NOTE — ED Notes (Signed)
Pt noted to be newly tachycardic, afib 100-130bpm, asymptomatic, EKG shot, MD notified.

## 2022-08-20 NOTE — Progress Notes (Signed)
Heart Failure Navigator Progress Note  Assessed for Heart & Vascular TOC clinic readiness.  Patient does not meet criteria due to Advanced heart failure team.    Earnestine Leys, BSN, RN Heart Failure Nurse Navigator Secure Chat Only

## 2022-08-20 NOTE — Progress Notes (Signed)
REDS reading taken on left side given radiation on the right side: 29% REDS reading taken on the right side for comparison: 29%  REDS Clip  READING = 29 (20-35% = normal     >35% = volume overload)   CHEST RULER = 35 in Clip Station = D

## 2022-08-20 NOTE — Progress Notes (Signed)
  Echocardiogram 2D Echocardiogram has been performed.  Nathaniel [name] 08/20/2022, 4:37 PM

## 2022-08-20 NOTE — ED Provider Notes (Addendum)
[facility] EMERGENCY DEPARTMENT Provider Note   CSN: [id number] Arrival date & time: 08/20/22  0932     History  Chief Complaint  Patient presents with   Shortness of Breath   Nathaniel [place]em[place] Loa Jr. is a 88 y.o. male.  Patient is an 88 year old male with a past medical history of paroxysmal A-fib on Coumadin, CAD status post CABG, hypertension, hyperlipidemia presenting to the emergency department with shortness of breath and palpitations.  The patient states he has had increasing dyspnea on exertion over the last few days.  He states that today his heart felt like it was beating irregularly and he borrowed somebody's Apple Watch that showed that his heart was in A-fib.  He states that he has a history of paroxysmal A-fib but has not been in A-fib for the last 9 months.  He states that he thought it was previously medication induced.  He states he has not been having any chest pain.  He reports occasional lightheadedness but denies any syncope.  He denies any nausea, vomiting or diarrhea.  He denies any recent medication changes.  The history is provided by the patient.  Shortness of Breath Nathaniel Fibrillation Associated symptoms include shortness of breath.       Home Medications Prior to Admission medications   Medication Sig Start Date End Date Taking? Authorizing Provider  atorvastatin (LIPITOR) 20 MG tablet Take 20 mg by mouth every other day. Alternating with 30mg    Yes [provider]  Coenzyme Q10 (CO Q10) 200 MG CAPS Take 200 mg by mouth daily.   Yes [provider]  dofetilide (TIKOSYN) 250 MCG capsule TAKE ONE CAPSULE TWICE A DAY 07/06/22  Yes Larey Dresser, MD  dorzolamide-timolol (COSOPT) 22.3-6.8 MG/ML ophthalmic solution Place 1 drop into both eyes 2 (two) times daily.   Yes [provider]  doxazosin (CARDURA) 2 MG tablet Take 2 mg by mouth daily. And 1 mg in the evening   Yes [provider]   ezetimibe (ZETIA) 10 MG tablet Take 1 tablet (10 mg total) by mouth at bedtime. 02/19/22  Yes Marin Olp, MD  furosemide (LASIX) 20 MG tablet Take 2 tablets (40 mg total) by mouth 2 (two) times daily. Please cancel all previous orders for current medication. Change in dosage or pill size. 07/15/22  Yes Larey Dresser, MD  latanoprost (XALATAN) 0.005 % ophthalmic solution Place 1 drop into the left eye at bedtime.   Yes [provider]  metoprolol succinate (TOPROL-XL) 25 MG 24 hr tablet Take 1 tablet (25 mg total) by mouth daily. 07/15/22  Yes Larey Dresser, MD  Misc Natural Products (GLUCOSAMINE CHONDROITIN TRIPLE) TABS Take 1 tablet by mouth daily.   Yes [provider]  ramipril (ALTACE) 2.5 MG capsule Take 1 capsule (2.5 mg total) by mouth 2 (two) times daily. 03/27/22  Yes Larey Dresser, MD  REPATHA SURECLICK 106 MG/ML SOAJ INJECT 1 PEN INTO SKIN EVERY 14 DAYS 02/18/22  Yes Larey Dresser, MD  spironolactone (ALDACTONE) 25 MG tablet Take 1 tablet (25 mg total) by mouth daily. 04/09/22  Yes Joette Catching, PA-C  tamsulosin (FLOMAX) 0.4 MG CAPS capsule Take 1 capsule (0.4 mg total) by mouth 2 (two) times daily. 02/19/22  Yes Marin Olp, MD  warfarin (COUMADIN) 3 MG tablet Take 3 tablets (9mg ) daily or as directed by [facility] Patient taking differently: Take 3 mg by mouth daily. Take 3  tablets (9mg ) daily or as directed by [facility] 04/16/22  Yes Camnitz, Ocie Doyne, MD      Allergies    Grass extracts [gramineae pollens], Milk-related compounds, and Amiodarone    Review of Systems   Review of Systems  Respiratory:  Positive for shortness of breath.     Physical Exam Updated Vital Signs BP (!) 125/99   Pulse 79   Temp 97.9 F (36.6 C)   Resp (!) 26   Ht 5\' 10"  (1.778 m)   Wt 87.1 kg   SpO2 100%   BMI 27.55 kg/m  Physical Exam Vitals and nursing note reviewed.  Constitutional:      General: He is not in acute distress.     Appearance: He is well-developed.  HENT:     Head: Normocephalic and atraumatic.     Mouth/Throat:     Mouth: Mucous membranes are moist.     Pharynx: Oropharynx is clear.  Eyes:     Extraocular Movements: Extraocular movements intact.  Cardiovascular:     Rate and Rhythm: Normal rate. Rhythm irregular.     Pulses: Normal pulses.  Pulmonary:     Effort: Pulmonary effort is normal.     Breath sounds: Normal breath sounds.  Abdominal:     Palpations: Abdomen is soft.     Tenderness: There is no abdominal tenderness.  Musculoskeletal:        General: Normal range of motion.     Cervical back: Normal range of motion and neck supple.     Right lower leg: No edema.     Left lower leg: No edema.  Skin:    General: Skin is warm and dry.  Neurological:     General: No focal deficit present.     Mental Status: He is alert and oriented to person, place, and time.  Psychiatric:        Mood and Affect: Mood normal.        Behavior: Behavior normal.     ED Results / Procedures / Treatments   Labs (all labs ordered are listed, but only abnormal results are displayed) Labs Reviewed  COMPREHENSIVE METABOLIC PANEL - Abnormal; Notable for the following components:      Result Value   Glucose, Bld 235 (*)    Creatinine, Ser 1.28 (*)    AST 52 (*)    GFR, Estimated 54 (*)    All other components within normal limits  PROTIME-INR - Abnormal; Notable for the following components:   Prothrombin Time 21.0 (*)    INR 1.8 (*)    All other components within normal limits  BRAIN NATRIURETIC PEPTIDE - Abnormal; Notable for the following components:   B Natriuretic Peptide 704.2 (*)    All other components within normal limits  TROPONIN I (HIGH SENSITIVITY) - Abnormal; Notable for the following components:   Troponin I (High Sensitivity) 3,666 (*)    All other components within normal limits  TROPONIN I (HIGH SENSITIVITY) - Abnormal; Notable for the following components:   Troponin I (High  Sensitivity) 3,866 (*)    All other components within normal limits  CBC  MAGNESIUM  HEPARIN LEVEL (UNFRACTIONATED)    EKG EKG Interpretation  Date/Time:  Thursday August 20 2022 09:41:09 EDT Ventricular Rate:  114 PR Interval:    QRS Duration: 140 QT Interval:  360 QTC Calculation: 496 R Axis:   76 Text Interpretation: Nathaniel fibrillation with rapid ventricular response Non-specific intra-ventricular conduction block T wave abnormality, consider inferolateral  ischemia Abnormal ECG  New inferolateral t-wave inversions with ST depression in II, V5 and V6 compared to prior EKG Confirmed by Oneal Deputy [phone]) on 08/20/2022 12:08:23 PM  Radiology DG Chest 2 View  Result Date: 08/20/2022 CLINICAL DATA:  Shortness of breath, chest tightness EXAM: CHEST - 2 VIEW COMPARISON:  02/20/2021.  PET CT 03/30/2022.  Chest CT 08/07/2022. FINDINGS: Prior CABG. Heart is normal size. Right apical density again noted as seen on recent PET CT and chest CT. No confluent opacity on the left. Mild elevation of the right hemidiaphragm. No effusions or acute bony abnormality. IMPRESSION: Right apical masslike density again noted as seen on recent CT and PET CT. Prior CABG. Electronically Signed   By: Rolm Baptise M.D.   On: 08/20/2022 10:14    Procedures .Critical Care  Performed by: Ottie Glazier, DO Authorized by: Ottie Glazier, DO   Critical care provider statement:    Critical care time (minutes):  35   Critical care time was exclusive of:  Separately billable procedures and treating other patients   Critical care was necessary to treat or prevent imminent or life-threatening deterioration of the following conditions:  Cardiac failure   Critical care was time spent personally by me on the following activities:  Development of treatment plan with patient or surrogate, discussions with consultants, examination of patient, evaluation of patient's response to treatment, obtaining history  from patient or surrogate, ordering and performing treatments and interventions, ordering and review of laboratory studies, ordering and review of radiographic studies and re-evaluation of patient's condition     Medications Ordered in ED Medications  heparin ADULT infusion 100 units/mL (25000 units/247mL) (1,050 Units/hr Intravenous New Bag/Given 08/20/22 1339)  sodium chloride flush (NS) 0.9 % injection 3 mL (has no administration in time range)  sodium chloride flush (NS) 0.9 % injection 3 mL (has no administration in time range)  0.9 %  sodium chloride infusion (has no administration in time range)  acetaminophen (TYLENOL) tablet 650 mg (has no administration in time range)  aspirin EC tablet 81 mg (has no administration in time range)  atorvastatin (LIPITOR) tablet 20 mg (has no administration in time range)  dofetilide (TIKOSYN) capsule 250 mcg (has no administration in time range)  ezetimibe (ZETIA) tablet 10 mg (has no administration in time range)  metoprolol succinate (TOPROL-XL) 24 hr tablet 25 mg (has no administration in time range)  spironolactone (ALDACTONE) tablet 25 mg (has no administration in time range)  tamsulosin (FLOMAX) capsule 0.4 mg (has no administration in time range)  sodium chloride flush (NS) 0.9 % injection 3 mL (has no administration in time range)  atorvastatin (LIPITOR) tablet 10 mg (has no administration in time range)  aspirin chewable tablet 324 mg (324 mg Oral Given 08/20/22 1240)    ED Course/ Medical Decision Making/ A&P                           Medical Decision Making This patient presents to the ED with chief complaint(s) of shortness of breath and palpitations with pertinent past medical history of A-fib, CAD which further complicates the presenting complaint. The complaint involves an extensive differential diagnosis and also carries with it a high risk of complications and morbidity.    The differential diagnosis includes ACS, arrhythmia, CHF,  pneumonia less likely as the patient has had no fevers or cough, aortic aneurysm rupture unlikely as the patient has no chest or abdominal pain, electrolyte  abnormality  Additional history obtained: Additional history obtained from family Records reviewed previous admission documents  ED Course and Reassessment: Patient was initially evaluated by rapid assessment provider and had labs and EKG performed.  EKG shows new inferior lateral T wave inversions, labs are pending at this time.  Chest x-ray is without significant pulmonary edema.  He will additionally have INR performed to evaluate for appropriate coagulation in the setting of his Coumadin  Independent labs interpretation:  The following labs were independently interpreted: Elevated troponin, mildly elevated BNP  Independent visualization of imaging: - I independently visualized the following imaging with scope of interpretation limited to determining acute life threatening conditions related to emergency care: Chest x-ray, which revealed mass like density as seen on prior CT otherwise no acute changes  Consultation: - Consulted or discussed management/test interpretation w/ external professional: cardiology  Consideration for admission or further workup: Patient's work-up showed signs concerning for NSTEMI with new T wave inversions inferior laterally with mild ST depressions.  His troponin was elevated greater than 3600.  Patient was given aspirin.  Because the patient's INR is mildly below therapeutic, he will be started on a heparin drip.  Cardiology evaluated the patient at bedside and plans for admission for further management. Social Determinants of health: N/A    Amount and/or Complexity of Data Reviewed Labs: ordered.  Risk OTC drugs. Prescription drug management. Decision regarding hospitalization.           Final Clinical Impression(s) / ED Diagnoses Final diagnoses:  NSTEMI (non-ST elevated myocardial  infarction) ([place])  Paroxysmal Nathaniel fibrillation [facility])    Rx / DC Orders ED Discharge Orders     None         Ottie Glazier, DO 08/20/22 1556    [place], [place], DO 08/20/22 [phone]

## 2022-08-20 NOTE — Progress Notes (Signed)
ANTICOAGULATION CONSULT NOTE - Initial Consult  Pharmacy Consult for Heparin Indication: chest pain/ACS, afib  Allergies  Allergen Reactions   Grass Extracts [Gramineae Pollens] Other (See Comments)    Sneezing and runny nose   Milk-Related Compounds Other (See Comments)    Just Yogurt.  Makes him very lethargic.   Amiodarone Anxiety    depression    Patient Measurements: Height: 5\' 10"  (177.8 cm) Weight: 87.1 kg (192 lb) IBW/kg (Calculated) : 73 Heparin Dosing Weight: 87 kg  Vital Signs: Temp: 97.8 F (36.6 C) (09/07 0945) Temp Source: Oral (09/07 0945) BP: 116/71 (09/07 1200) Pulse Rate: 90 (09/07 1200)  Labs: Recent Labs    08/20/22 1000  HGB 14.1  HCT 43.6  PLT 195  CREATININE 1.28*  TROPONINIHS 3,666*    Estimated Creatinine Clearance: 41.2 mL/min (A) (by C-G formula based on SCr of 1.28 mg/dL (H)).   Medical History: Past Medical History:  Diagnosis Date   AAA (abdominal aortic aneurysm) (HCC)    Atrial fibrillation (HCC)    Atrial fibrillation (HCC)    CAD (coronary artery disease)    Cataract    CHF (congestive heart failure) (HCC)    Chronic kidney disease    Colon polyps    adenomatous   Diabetes mellitus    Diverticulosis    Dysrhythmia    A-Fib   GERD (gastroesophageal reflux disease)    Gout    Hernia, abdominal    History of aortic valve replacement with bioprosthetic valve    2007   Hx: UTI (urinary tract infection)    Hyperlipemia    Hypertension    Internal hemorrhoids    Peripheral vascular disease (HCC)    Shortness of breath    with exertion    Medications:  See electronic med rec  Assessment: 88 y.o. M presents with ACS and thinks in afib. Pt on warfarin PTA for afib. Admission INR 1.8. CBC ok on admission. Starting heparin gtt while being worked up for ACS.  Goal of Therapy:  Heparin level 0.3-0.7 units/ml Monitor platelets by anticoagulation protocol: Yes   Plan:  Heparin gtt at 1050 units/hr Will f/u heparin  level in 8 hours Daily heparin level, INR, and CBC  Sherlon Handing, PharmD, BCPS Please see amion for complete clinical pharmacist phone list 08/20/2022,12:19 PM

## 2022-08-21 ENCOUNTER — Ambulatory Visit (HOSPITAL_COMMUNITY): Admit: 2022-08-21 | Payer: Self-pay | Admitting: Cardiology

## 2022-08-21 ENCOUNTER — Inpatient Hospital Stay (HOSPITAL_COMMUNITY)
Admission: EM | Disposition: A | Discharge status: Home / Self Care | Payer: Self-pay | Source: Home / Self Care | Attending: Cardiology

## 2022-08-21 DIAGNOSIS — I4891 Unspecified atrial fibrillation: Secondary | ICD-10-CM

## 2022-08-21 DIAGNOSIS — I2510 Atherosclerotic heart disease of native coronary artery without angina pectoris: Secondary | ICD-10-CM

## 2022-08-21 HISTORY — PX: RIGHT HEART CATH AND CORONARY/GRAFT ANGIOGRAPHY: CATH118265

## 2022-08-21 LAB — LIPID PANEL
Cholesterol: 92 mg/dL (ref 0–200)
HDL: 38 mg/dL — ABNORMAL LOW (ref >40)
LDL Cholesterol: 31 mg/dL (ref 0–99)
Total CHOL/HDL Ratio: 2.4 RATIO
Triglycerides: 114 mg/dL (ref <150)
VLDL: 23 mg/dL (ref 0–40)

## 2022-08-21 LAB — HEPARIN LEVEL (UNFRACTIONATED): Heparin Unfractionated: 0.38 IU/mL (ref 0.30–0.70)

## 2022-08-21 LAB — BASIC METABOLIC PANEL
Anion gap: 12 (ref 5–15)
BUN: 22 mg/dL (ref 8–23)
CO2: 24 mmol/L (ref 22–32)
Calcium: 9.3 mg/dL (ref 8.9–10.3)
Chloride: 101 mmol/L (ref 98–111)
Creatinine, Ser: 1.24 mg/dL (ref 0.61–1.24)
GFR, Estimated: 56 mL/min — ABNORMAL LOW (ref >60)
Glucose, Bld: 149 mg/dL — ABNORMAL HIGH (ref 70–99)
Potassium: 4.4 mmol/L (ref 3.5–5.1)
Sodium: 137 mmol/L (ref 135–145)

## 2022-08-21 LAB — CBC
HCT: 40.8 % (ref 39.0–52.0)
Hemoglobin: 13.6 g/dL (ref 13.0–17.0)
MCH: 30.4 pg (ref 26.0–34.0)
MCHC: 33.3 g/dL (ref 30.0–36.0)
MCV: 91.1 fL (ref 80.0–100.0)
Platelets: 162 10*3/uL (ref 150–400)
RBC: 4.48 MIL/uL (ref 4.22–5.81)
RDW: 12.3 % (ref 11.5–15.5)
WBC: 7.2 10*3/uL (ref 4.0–10.5)
nRBC: 0 % (ref 0.0–0.2)

## 2022-08-21 LAB — PROTIME-INR
INR: 1.6 — ABNORMAL HIGH (ref 0.8–1.2)
Prothrombin Time: 18.9 seconds — ABNORMAL HIGH (ref 11.4–15.2)

## 2022-08-21 LAB — MAGNESIUM: Magnesium: 1.9 mg/dL (ref 1.7–2.4)

## 2022-08-21 SURGERY — RIGHT HEART CATH AND CORONARY/GRAFT ANGIOGRAPHY
Anesthesia: LOCAL

## 2022-08-21 MED ORDER — DORZOLAMIDE HCL-TIMOLOL MAL 2-0.5 % OP SOLN
1.0000 [drp] | Freq: Two times a day (BID) | OPHTHALMIC | Status: DC
  Administered 2022-08-21 – 2022-08-24 (×6): 1 [drp] via OPHTHALMIC
  Filled 2022-08-21: qty 10

## 2022-08-21 MED ORDER — WARFARIN SODIUM 5 MG PO TABS
10.0000 mg | ORAL_TABLET | Freq: Once | ORAL | Status: AC
  Administered 2022-08-21: 10 mg via ORAL
  Filled 2022-08-21: qty 2

## 2022-08-21 MED ORDER — HEPARIN (PORCINE) IN NACL 1000-0.9 UT/500ML-% IV SOLN
INTRAVENOUS | Status: DC | PRN
  Administered 2022-08-21 (×2): 500 mL

## 2022-08-21 MED ORDER — HEPARIN SODIUM (PORCINE) 1000 UNIT/ML IJ SOLN
INTRAMUSCULAR | Status: AC
  Filled 2022-08-21: qty 10

## 2022-08-21 MED ORDER — ACETAMINOPHEN 325 MG PO TABS: 650.0000 mg | ORAL_TABLET | ORAL | Status: DC | PRN

## 2022-08-21 MED ORDER — METOPROLOL SUCCINATE ER 25 MG PO TB24: 25.0000 mg | ORAL_TABLET | Freq: Every day | ORAL | Status: DC

## 2022-08-21 MED ORDER — SODIUM CHLORIDE 0.9 % IV SOLN: 250.0000 mL | INTRAVENOUS | Status: DC | PRN

## 2022-08-21 MED ORDER — LIDOCAINE HCL (PF) 1 % IJ SOLN
INTRAMUSCULAR | Status: AC
  Filled 2022-08-21: qty 30

## 2022-08-21 MED ORDER — LATANOPROST 0.005 % OP SOLN
1.0000 [drp] | Freq: Every day | OPHTHALMIC | Status: DC
  Administered 2022-08-21 – 2022-08-23 (×3): 1 [drp] via OPHTHALMIC
  Filled 2022-08-21: qty 2.5

## 2022-08-21 MED ORDER — SODIUM CHLORIDE 0.9% FLUSH
3.0000 mL | INTRAVENOUS | Status: DC | PRN
  Administered 2022-08-24: 3 mL via INTRAVENOUS

## 2022-08-21 MED ORDER — ATORVASTATIN CALCIUM 10 MG PO TABS
20.0000 mg | ORAL_TABLET | Freq: Every day | ORAL | Status: DC
  Administered 2022-08-22 – 2022-08-24 (×3): 20 mg via ORAL
  Filled 2022-08-21 (×3): qty 2

## 2022-08-21 MED ORDER — LABETALOL HCL 5 MG/ML IV SOLN
10.0000 mg | INTRAVENOUS | Status: AC | PRN
Start: 2022-08-21 — End: 2022-08-21

## 2022-08-21 MED ORDER — ONDANSETRON HCL 4 MG/2ML IJ SOLN: 4.0000 mg | Freq: Four times a day (QID) | INTRAMUSCULAR | Status: DC | PRN

## 2022-08-21 MED ORDER — FENTANYL CITRATE (PF) 100 MCG/2ML IJ SOLN
INTRAMUSCULAR | Status: AC
  Filled 2022-08-21: qty 2

## 2022-08-21 MED ORDER — VERAPAMIL HCL 2.5 MG/ML IV SOLN
INTRAVENOUS | Status: AC
  Filled 2022-08-21: qty 2

## 2022-08-21 MED ORDER — LIDOCAINE HCL (PF) 1 % IJ SOLN
INTRAMUSCULAR | Status: DC | PRN
  Administered 2022-08-21: 2 mL
  Administered 2022-08-21: 5 mL

## 2022-08-21 MED ORDER — MIDAZOLAM HCL 2 MG/2ML IJ SOLN
INTRAMUSCULAR | Status: DC | PRN
  Administered 2022-08-21: .5 mg via INTRAVENOUS

## 2022-08-21 MED ORDER — MIDAZOLAM HCL 2 MG/2ML IJ SOLN
INTRAMUSCULAR | Status: AC
  Filled 2022-08-21: qty 2

## 2022-08-21 MED ORDER — ATORVASTATIN CALCIUM 80 MG PO TABS: 80.0000 mg | ORAL_TABLET | ORAL | Status: DC

## 2022-08-21 MED ORDER — WARFARIN - PHARMACIST DOSING INPATIENT: Freq: Every day | Status: DC

## 2022-08-21 MED ORDER — SODIUM CHLORIDE 0.9 % IV SOLN: INTRAVENOUS | Status: AC

## 2022-08-21 MED ORDER — ASPIRIN 81 MG PO CHEW
81.0000 mg | CHEWABLE_TABLET | ORAL | Status: AC
  Administered 2022-08-21: 81 mg via ORAL
  Filled 2022-08-21: qty 1

## 2022-08-21 MED ORDER — MAGNESIUM SULFATE 2 GM/50ML IV SOLN
2.0000 g | Freq: Once | INTRAVENOUS | Status: AC
  Administered 2022-08-21: 2 g via INTRAVENOUS
  Filled 2022-08-21: qty 50

## 2022-08-21 MED ORDER — HEPARIN (PORCINE) IN NACL 1000-0.9 UT/500ML-% IV SOLN
INTRAVENOUS | Status: AC
  Filled 2022-08-21: qty 1000

## 2022-08-21 MED ORDER — SODIUM CHLORIDE 0.9% FLUSH
3.0000 mL | Freq: Two times a day (BID) | INTRAVENOUS | Status: DC
  Administered 2022-08-22 – 2022-08-24 (×3): 3 mL via INTRAVENOUS

## 2022-08-21 MED ORDER — FENTANYL CITRATE (PF) 100 MCG/2ML IJ SOLN
INTRAMUSCULAR | Status: DC | PRN
Start: 2022-08-21 — End: 2022-08-21
  Administered 2022-08-21: 25 ug via INTRAVENOUS

## 2022-08-21 MED ORDER — SODIUM CHLORIDE 0.9 % IV BOLUS
INTRAVENOUS | Status: AC | PRN
  Administered 2022-08-21: 250 mL via INTRAVENOUS

## 2022-08-21 MED ORDER — HYDRALAZINE HCL 20 MG/ML IJ SOLN: 10.0000 mg | INTRAMUSCULAR | Status: AC | PRN

## 2022-08-21 MED ORDER — METOPROLOL SUCCINATE ER 50 MG PO TB24: 50.0000 mg | ORAL_TABLET | Freq: Once | ORAL | Status: DC

## 2022-08-21 MED ORDER — METOPROLOL SUCCINATE ER 50 MG PO TB24
50.0000 mg | ORAL_TABLET | Freq: Two times a day (BID) | ORAL | Status: DC
  Administered 2022-08-21 (×2): 50 mg via ORAL
  Filled 2022-08-21 (×4): qty 1

## 2022-08-21 MED ORDER — IOHEXOL 350 MG/ML SOLN
INTRAVENOUS | Status: DC | PRN
  Administered 2022-08-21: 50 mL

## 2022-08-21 MED ORDER — HEPARIN (PORCINE) 25000 UT/250ML-% IV SOLN
1300.0000 [IU]/h | INTRAVENOUS | Status: DC
  Administered 2022-08-21 – 2022-08-23 (×3): 1300 [IU]/h via INTRAVENOUS
  Filled 2022-08-21 (×3): qty 250

## 2022-08-21 MED ORDER — SODIUM CHLORIDE 0.9% FLUSH: 3.0000 mL | INTRAVENOUS | Status: DC | PRN

## 2022-08-21 MED ORDER — SODIUM CHLORIDE 0.9 % IV SOLN: INTRAVENOUS | Status: DC

## 2022-08-21 SURGICAL SUPPLY — 14 items
CATH BALLN WEDGE 5F 110CM (CATHETERS) ×1 IMPLANT
CATH INFINITI 5 FR IM (CATHETERS) ×1 IMPLANT
CATH INFINITI 5FR MULTPACK ANG (CATHETERS) ×1 IMPLANT
CLOSURE MYNX CONTROL 5F (Vascular Products) ×1 IMPLANT
ELECT DEFIB PAD ADLT CADENCE (PAD) ×1 IMPLANT
KIT HEART LEFT (KITS) ×2 IMPLANT
KIT MICROPUNCTURE NIT STIFF (SHEATH) ×1 IMPLANT
MAT PREVALON FULL STRYKER (MISCELLANEOUS) ×1 IMPLANT
PACK CARDIAC CATHETERIZATION (CUSTOM PROCEDURE TRAY) ×2 IMPLANT
SHEATH GLIDE SLENDER 4/5FR (SHEATH) ×1 IMPLANT
SHEATH PINNACLE 5F 10CM (SHEATH) ×1 IMPLANT
SHEATH PROBE COVER 6X72 (BAG) ×1 IMPLANT
TRANSDUCER W/STOPCOCK (MISCELLANEOUS) ×2 IMPLANT
WIRE EMERALD 3MM-J .035X150CM (WIRE) ×1 IMPLANT

## 2022-08-21 NOTE — Progress Notes (Addendum)
Addendum:  Patient s/p cath this morning. Patient with known CAD, likely has new graft that has failed. No good targets, will continue medical management for now.   Orders received to restart heparin and warfarin this afternoon. Heparin was previously at goal on 1300 units/hr.   Patient on 9mg  of warfarin daily prior to admit, will give a little extra tonight.   08/21/2022 2:33 PM  ANTICOAGULATION CONSULT NOTE   Pharmacy Consult for Heparin Indication: chest pain/ACS, afib  Allergies  Allergen Reactions   Grass Extracts [Gramineae Pollens] Other (See Comments)    Sneezing and runny nose   Milk-Related Compounds Other (See Comments)    Just Yogurt.  Makes him very lethargic.   Amiodarone Anxiety    depression    Patient Measurements: Height: 5\' 10"  (177.8 cm) Weight: 84.4 kg (186 lb 1.6 oz) IBW/kg (Calculated) : 73 Heparin Dosing Weight: 87 kg  Vital Signs: Temp: 98 F (36.7 C) (09/08 0402) Temp Source: Oral (09/08 0402) BP: 117/61 (09/08 0402) Pulse Rate: 92 (09/08 0402)  Labs: Recent Labs    08/20/22 1000 08/20/22 1212 08/20/22 2151 08/21/22 0704  HGB 14.1  --   --  13.6  HCT 43.6  --   --  40.8  PLT 195  --   --  162  LABPROT  --  21.0*  --  18.9*  INR  --  1.8*  --  1.6*  HEPARINUNFRC  --   --  0.19* 0.38  CREATININE 1.28*  --   --   --   TROPONINIHS 3,666* 3,866*  --   --      Estimated Creatinine Clearance: 41.2 mL/min (A) (by C-G formula based on SCr of 1.28 mg/dL (H)).   Medical History: Past Medical History:  Diagnosis Date   AAA (abdominal aortic aneurysm) (HCC)    Atrial fibrillation (HCC)    Atrial fibrillation (HCC)    CAD (coronary artery disease)    Cataract    CHF (congestive heart failure) (HCC)    Chronic kidney disease    Colon polyps    adenomatous   Diabetes mellitus    Diverticulosis    Dysrhythmia    A-Fib   GERD (gastroesophageal reflux disease)    Gout    Hernia, abdominal    History of aortic valve replacement with  bioprosthetic valve    2007   Hx: UTI (urinary tract infection)    Hyperlipemia    Hypertension    Internal hemorrhoids    Peripheral vascular disease (HCC)    Shortness of breath    with exertion    Assessment: 88 y.o. M presents with ACS and thinks in afib. Pt on warfarin PTA for afib. Admission INR 1.8. CBC ok on admission. Starting heparin gtt while being worked up for ACS.  Heparin level now at goal this morning at 0.38, INR down to 1.6. CBC stable overnight. No bleeding issues noted.   Goal of Therapy:  Heparin level 0.3-0.7 units/ml Monitor platelets by anticoagulation protocol: Yes   Plan:  Continue Heparin gtt at 1300 units/hr Plan for heart cath today 9/8 at noon - will follow up plan this afternoon Daily heparin level, INR, and CBC  Erin Hearing PharmD., BCPS Clinical Pharmacist 08/21/2022 8:22 AM

## 2022-08-21 NOTE — Interval H&P Note (Signed)
History and Physical Interval Note:  08/21/2022 12:07 PM  [place].  has presented today for surgery, with the diagnosis of nstemi.  The various methods of treatment have been discussed with the patient and family. After consideration of risks, benefits and other options for treatment, the patient has consented to  Procedure(s): RIGHT/LEFT HEART CATH AND CORONARY/GRAFT ANGIOGRAPHY (N/A) as a surgical intervention.  The patient's history has been reviewed, patient examined, no change in status, stable for surgery.  I have reviewed the patient's chart and labs.  Questions were answered to the patient's satisfaction.     Alara Daniel [employer]

## 2022-08-21 NOTE — Progress Notes (Addendum)
Advanced Heart Failure Rounding Note  PCP-Cardiologist: None   Subjective:   Presented yesterday with worsening CP and exertional dyspnea for ~3 days  This morning on ambulation to bathroom, pt experienced some CP. HR briefly up to 160's. EKG showed a fib RVR in 130's. CP resolved with return to bed. Minimal CP present, intermittent back pain, resolved with repositioning. Denies SOB.   In a fib 90s-130s while in room. Spoke with patient about amiodarone with previous history, strongly against trying again.  Plan for L/R heart cath today.  Objective:   Weight Range: 84.4 kg Body mass index is 26.7 kg/m.   Vital Signs:   Temp:  [97.5 F (36.4 C)-98.1 F (36.7 C)] 98 F (36.7 C) (09/08 0402) Pulse Rate:  [62-133] 92 (09/08 0402) Resp:  [18-26] 18 (09/08 0402) BP: (99-131)/(55-116) 117/61 (09/08 0402) SpO2:  [93 %-100 %] 93 % (09/07 2350) Weight:  [84.4 kg-87.1 kg] 84.4 kg (09/08 0500) Last BM Date : 08/20/22  Weight change: Filed Weights   08/20/22 0940 08/20/22 2144 08/21/22 0500  Weight: 87.1 kg 84.7 kg 84.4 kg    Intake/Output:   Intake/Output Summary (Last 24 hours) at 08/21/2022 0821 Last data filed at 08/21/2022 0409 Gross per 24 hour  Intake 523.94 ml  Output --  Net 523.94 ml     Physical Exam    General:  well appearing. No respiratory difficulty HEENT: normal Neck: supple. JVD ~8. Carotids 2+ bilat; no bruits. No lymphadenopathy or thyromegaly appreciated. Cor: PMI nondisplaced. Irregular rate & rhythm. No rubs, gallops or murmurs. Lungs: clear Abdomen: soft, nontender, nondistended. No hepatosplenomegaly. No bruits or masses. Good bowel sounds. Extremities: no cyanosis, clubbing, rash, edema  Neuro: alert & oriented x 3, cranial nerves grossly intact. moves all 4 extremities w/o difficulty. Affect pleasant.   Telemetry   A fib 90s-130s  EKG    A fib RVR 128 bpm Non-specific intra-ventricular conduction block Minimal voltage criteria for LVH,  may be normal variant  Labs    CBC Recent Labs    08/20/22 1000 08/21/22 0704  WBC 7.7 7.2  HGB 14.1 13.6  HCT 43.6 40.8  MCV 94.0 91.1  PLT 195 191   Basic Metabolic Panel Recent Labs    08/20/22 1000 08/20/22 1212  NA 136  --   K 4.8  --   CL 101  --   CO2 24  --   GLUCOSE 235*  --   BUN 23  --   CREATININE 1.28*  --   CALCIUM 9.3  --   MG  --  2.0   Liver Function Tests Recent Labs    08/20/22 1000  AST 52*  ALT 18  ALKPHOS 46  BILITOT 1.1  PROT 6.9  ALBUMIN 3.7   No results for input(s): "LIPASE", "AMYLASE" in the last 72 hours. Cardiac Enzymes No results for input(s): "CKTOTAL", "CKMB", "CKMBINDEX", "TROPONINI" in the last 72 hours.  BNP: BNP (last 3 results) Recent Labs    03/06/22 1400 07/15/22 1206 08/20/22 1230  BNP 172.0* 164.3* 704.2*    ProBNP (last 3 results) No results for input(s): "PROBNP" in the last 8760 hours.   D-Dimer No results for input(s): "DDIMER" in the last 72 hours. Hemoglobin A1C No results for input(s): "HGBA1C" in the last 72 hours. Fasting Lipid Panel No results for input(s): "CHOL", "HDL", "LDLCALC", "TRIG", "CHOLHDL", "LDLDIRECT" in the last 72 hours. Thyroid Function Tests No results for input(s): "TSH", "T4TOTAL", "T3FREE", "THYROIDAB" in the last 72  hours.  Invalid input(s): "FREET3"  Other results:   Imaging    ECHOCARDIOGRAM COMPLETE  Result Date: 08/20/2022    ECHOCARDIOGRAM REPORT   Patient Name:   Nathaniel [name]. Date of Exam: 08/20/2022 Medical Rec #:  [id number]        Height:       70.0 in Accession #:    [id number]       Weight:       192.0 lb Date of Birth:  1932-05-10        BSA:          2.051 m Patient Age:    88 years         BP:           125/99 mmHg Patient Gender: M                HR:           104 bpm. Exam Location:  Inpatient Procedure: 2D Echo, Cardiac Doppler and Color Doppler Indications:    CHF-acute diastolic  History:        Patient has prior history of Echocardiogram  examinations, most                 recent 03/19/2022. CAD, Prior CABG, Arrythmias:Atrial                 Fibrillation; Signs/Symptoms:Shortness of Breath.                 Aortic Valve: bioprosthetic valve is present in the aortic                 position.  Sonographer:    Clayton Lefort RDCS (AE) Referring Phys: [place] Comments: Suboptimal parasternal window and suboptimal subcostal window. IMPRESSIONS  1. Pt in atrial fibrillation during the study.  2. Left ventricular ejection fraction, by estimation, is 45 to 50%. The left ventricle has mildly decreased function. The left ventricle demonstrates global hypokinesis. Left ventricular diastolic function could not be evaluated.  3. Right ventricular systolic function is normal. The right ventricular size is normal.  4. Left atrial size was moderately dilated.  5. The mitral valve is normal in structure. Mild mitral valve regurgitation. No evidence of mitral stenosis. Moderate mitral annular calcification.  6. The aortic valve has been repaired/replaced. Aortic valve regurgitation is not visualized. No aortic stenosis is present. There is a bioprosthetic valve present in the aortic position.  7. Aortic dilatation noted. There is mild dilatation of the ascending aorta, measuring 40 mm.  8. The inferior vena cava is normal in size with greater than 50% respiratory variability, suggesting right atrial pressure of 3 mmHg. FINDINGS  Left Ventricle: Left ventricular ejection fraction, by estimation, is 45 to 50%. The left ventricle has mildly decreased function. The left ventricle demonstrates global hypokinesis. The left ventricular internal cavity size was normal in size. There is  no left ventricular hypertrophy. Left ventricular diastolic function could not be evaluated due to atrial fibrillation. Left ventricular diastolic function could not be evaluated. Right Ventricle: The right ventricular size is normal. Right ventricular systolic function  is normal. Left Atrium: Left atrial size was moderately dilated. Right Atrium: Right atrial size was normal in size. Pericardium: There is no evidence of pericardial effusion. Mitral Valve: The mitral valve is normal in structure. Moderate mitral annular calcification. Mild mitral valve regurgitation. No evidence of mitral valve stenosis. Tricuspid Valve: The tricuspid valve is normal in structure.  Tricuspid valve regurgitation is trivial. No evidence of tricuspid stenosis. Aortic Valve: The aortic valve has been repaired/replaced. Aortic valve regurgitation is not visualized. No aortic stenosis is present. Aortic valve mean gradient measures 3.0 mmHg. Aortic valve peak gradient measures 5.9 mmHg. Aortic valve area, by VTI measures 2.49 cm. There is a bioprosthetic valve present in the aortic position. Pulmonic Valve: The pulmonic valve was not well visualized. Pulmonic valve regurgitation is not visualized. No evidence of pulmonic stenosis. Aorta: Aortic dilatation noted. There is mild dilatation of the ascending aorta, measuring 40 mm. Venous: The inferior vena cava is normal in size with greater than 50% respiratory variability, suggesting right atrial pressure of 3 mmHg. IAS/Shunts: No atrial level shunt detected by color flow Doppler. Additional Comments: Pt in atrial fibrillation during the study.  LEFT VENTRICLE PLAX 2D LVIDd:         4.20 cm LVIDs:         3.40 cm LV PW:         1.10 cm LV IVS:        1.10 cm LVOT diam:     2.40 cm LV SV:         48 LV SV Index:   23 LVOT Area:     4.52 cm  LV Volumes (MOD) LV vol d, MOD A2C: 105.0 ml LV vol d, MOD A4C: 139.0 ml LV vol s, MOD A2C: 91.7 ml LV vol s, MOD A4C: 67.0 ml LV SV MOD A2C:     13.3 ml LV SV MOD A4C:     139.0 ml LV SV MOD BP:      43.5 ml RIGHT VENTRICLE             IVC RV Basal diam:  3.60 cm     IVC diam: 1.80 cm RV S prime:     10.10 cm/s TAPSE (M-mode): 1.3 cm LEFT ATRIUM             Index        RIGHT ATRIUM           Index LA diam:        4.50  cm 2.19 cm/m   RA Area:     19.80 cm LA Vol (A2C):   98.6 ml 48.06 ml/m  RA Volume:   50.10 ml  24.42 ml/m LA Vol (A4C):   80.7 ml 39.34 ml/m LA Biplane Vol: 89.2 ml 43.48 ml/m  AORTIC VALVE AV Area (Vmax):    2.47 cm AV Area (Vmean):   2.49 cm AV Area (VTI):     2.49 cm AV Vmax:           121.33 cm/s AV Vmean:          76.300 cm/s AV VTI:            0.193 m AV Peak Grad:      5.9 mmHg AV Mean Grad:      3.0 mmHg LVOT Vmax:         66.17 cm/s LVOT Vmean:        41.967 cm/s LVOT VTI:          0.106 m LVOT/AV VTI ratio: 0.55  AORTA Ao Root diam: 3.60 cm Ao Asc diam:  4.00 cm  SHUNTS Systemic VTI:  0.11 m Systemic Diam: 2.40 cm Kirk Ruths MD Electronically signed by Kirk Ruths MD Signature Date/Time: 08/20/2022/4:48:36 PM    Final    DG Chest 2 View  Result Date: 08/20/2022  CLINICAL DATA:  Shortness of breath, chest tightness EXAM: CHEST - 2 VIEW COMPARISON:  02/20/2021.  PET CT 03/30/2022.  Chest CT 08/07/2022. FINDINGS: Prior CABG. Heart is normal size. Right apical density again noted as seen on recent PET CT and chest CT. No confluent opacity on the left. Mild elevation of the right hemidiaphragm. No effusions or acute bony abnormality. IMPRESSION: Right apical masslike density again noted as seen on recent CT and PET CT. Prior CABG. Electronically Signed   By: Rolm Baptise M.D.   On: 08/20/2022 10:14     Medications:     Scheduled Medications:  aspirin EC  81 mg Oral Daily   atorvastatin  10 mg Oral QODAY   atorvastatin  20 mg Oral QODAY   dofetilide  250 mcg Oral BID   ezetimibe  10 mg Oral QHS   metoprolol succinate  25 mg Oral Daily   sodium chloride flush  3 mL Intravenous Q12H   sodium chloride flush  3 mL Intravenous Q12H   spironolactone  25 mg Oral Daily   tamsulosin  0.4 mg Oral BID    Infusions:  sodium chloride     sodium chloride     sodium chloride 10 mL/hr at 08/21/22 0546   heparin 1,300 Units/hr (08/21/22 0409)    PRN Medications: sodium chloride, sodium  chloride, acetaminophen, sodium chloride flush, sodium chloride flush    Patient Profile  88 y.o. male with history of extensive vascular disease including CAD s/p CABG and redo CABG w/ AVR, AAA s/p endovascular repair in 2015, PAD, carotid stenosis s/p Rt CEA, chronic systolic and diastolic heart failure as well as difficult to control paroxsymal atrial fibrillation, recently diagnosed with stage IA2 NSCLC RUL treated w/ SBRT, presenting w/ unstable angina, ruled in for NSTEMI. Back in Afib w/ CVR.   Assessment/Plan   NSTEMI, Known CAD  - s/p CABG and Redo CABG  - Last LHC 06/2014 showed significant coronary disease w/ no good interventional targets (LIMA-LAD patent with 40-50% stenosis in LAD after touchdown, sequential SVG-ramus/OM/diagonal with only the ramus branch still intact (known from prior cath), SVG-PDA from original surgery TO, SVG-PDA from redo surgery with long 50-60% mid-graft stenosis).  - 3 day h/o new CP c/w [place] - Hs trop [phone] - ACS protocol>>continue IV heparin, received 324 mg dose of ASA in ED  - Start 81 mg ASA daily - continue Atorva 20 QOD + Zetia. Continue Repatha at d/c  - Against trying amiodarone. On 25 metoprolol daily, will increase to 50mg  BID to try and better rate control until Monday.  - Plan for TEE/DCCV Monday - Limited options, hesitant to use dilt gtt with BP and EF - NPO for [facility] today   2.  Acute on Heart Failure, H/o HFrEF>>HFimEF  - Echo 6/15 EF 30-35% - Echo 1/16 EF normalized to 55-60% - Echo 1/19 EF 45-50% (in setting of afib) - TEE 6/20 EF 50% - TEE 2/22 EF 45-50% - TEE 4/23 EF 50-55%, RV mildly reduced  - recent progression of dyspnea, but suspect combination of coronary ischemia + mild fluid overload on exam - BNP 704 but no frank edema on CXR, will check ReDs. Suspect he will need another dose of IV Lasix tonight - Echo yesterday: EF 45-50%, LV global hypokinesis, RS systolic function normal. LA mod dilated. Mild MR, Trivial  TR - Hold ramipril 2.5 mg bid for cath, can start back after  - Continue spironolactone 25 mg daily.  - Off Jardiance with  E coli UTI.  - Continue Toprol XL increasing to 50 mg BID - no unna boots w/ PAD    3. PAF  - difficult to control, failed multiple cardioversoins - failed amiodarone due to CNS SE, feels strongly against trying again - on Tikosyn but w/ breakthrough afib - offered ablation by EP but he declined  - s/p DCCV 4/23>>NSR - back in Afib on admit, CVR 70s. QTc ok at 431 ms  - hold coumadin for cath, INR 1.6. Continue heparin gtt  - continue Tikosyn for now. Follow QTc. Monitor K and Mg  - reattempt DCCV after cath if no spontaneous conversion    4. NSCLC RUL  - completed SBRT  - followed by radiation oncology  - recent f/u CT 8/23 w/ signs of disease progression vs ? Inflammation  - CXR also w/ increased size of RUL opacity - will ask Dr. Valeta Harms to review images, to help guide how aggressive we should be in treating his current cardiac issues. If concern for disease progression then best approach may be palliative     5. HTN - controlled on current regimen - continue GDMT/home regimen per above    6. Bioprosthetic AVR - Valve stable on 04/23 TEE - Update 2D echo: bioprosthetic valve present in the aortic position, no AVR seen   7. HLD  - LDL Goal < 55  - on Repatha, Atorva QOD + Zetia - check FLP in AM    8. Type 2DM - recent A1c 6.9  - SSI  - off SLGT2i given UTIs    9. PAD: - AAA s/p EVR, LE PAD + Carotid Artery Disease s/p Rt CEA  - No current claudication symptoms.  - followed by Dr. Trula Slade  - continue ASA, lipid lower agents per above   10. CODE STATUS - discussed code status - Full code for now   Length of Stay: [place], AGACNP-BC  08/21/2022, 8:21 AM  Advanced Heart Failure Team Pager [phone] (M-F; 7a - 5p)  Please contact [facility] for night-coverage after hours (5p -7a ) and weekends on [email or web address]  Patient seen with NP,  agree with the above note.   RHC/LHC today:  1. Normal filling pressures.  2. Known occlusion of native coronaries and SVG-RCA from original CABG.  3. New occlusion of SVG-PDA from CABG#2.  4. Long 99% stenosis in the SVG-ramus (was sequential SVG-ramus/OM/D but OM and D branches noted to be lost on prior cath).  Ramus is relatively small vessel.  5. Patent LIMA-LAD with collaterals to RCA territory.   He remains in atrial fibrillation, rate around 100.    General: NAD Neck: No JVD, no thyromegaly or thyroid nodule.  Lungs: Clear to auscultation bilaterally with normal respiratory effort. CV: Nondisplaced PMI.  Heart irregular S1/S2, no S3/S4, 1/6 SEM RUSB.  No peripheral edema.   Abdomen: Soft, nontender, no hepatosplenomegaly, no distention.  Skin: Intact without lesions or rashes.  Neurologic: Alert and oriented x 3.  Psych: Normal affect. Extremities: No clubbing or cyanosis.  HEENT: Normal.   Patient presented with recurrent atrial fibrillation/RVR in setting of NSTEMI.  Echo stable with EF 45-50% this admission.   Cath today, suspect culprit was either occlusion of SVG-PDA or 99% stenosis of SVG-ramus. Discussed with Dr. Ali Lowe, probably not much benefit to intervention on SVG-ramus.  Small territory covered and complex disease in the SVG.  Plan medical management.  - Continue heparin gtt as we get him back on warfarin and  INR back to therapeutic range.  - Continue atorvastatin 20 mg daily, does not tolerate higher dose.  Restart Zetia and continue Repatha at home. Check lipids in am.   He will need TEE-guided DCCV to get him back into NSR on Monday.  Continue dofetilide and heparin/warfarin overlap.  Have increased Toprol XL to 50 mg bid to rate control him.  He did not tolerate amiodarone in the past and does not want to retry it.  Will get ECG in am.   [name] 08/21/2022

## 2022-08-22 LAB — BASIC METABOLIC PANEL
Anion gap: 8 (ref 5–15)
BUN: 17 mg/dL (ref 8–23)
CO2: 24 mmol/L (ref 22–32)
Calcium: 8.9 mg/dL (ref 8.9–10.3)
Chloride: 102 mmol/L (ref 98–111)
Creatinine, Ser: 1.12 mg/dL (ref 0.61–1.24)
GFR, Estimated: >60 mL/min (ref >60)
Glucose, Bld: 119 mg/dL — ABNORMAL HIGH (ref 70–99)
Potassium: 4.4 mmol/L (ref 3.5–5.1)
Sodium: 134 mmol/L — ABNORMAL LOW (ref 135–145)

## 2022-08-22 LAB — CBC
HCT: 41.1 % (ref 39.0–52.0)
Hemoglobin: 13.7 g/dL (ref 13.0–17.0)
MCH: 30.4 pg (ref 26.0–34.0)
MCHC: 33.3 g/dL (ref 30.0–36.0)
MCV: 91.1 fL (ref 80.0–100.0)
Platelets: 137 10*3/uL — ABNORMAL LOW (ref 150–400)
RBC: 4.51 MIL/uL (ref 4.22–5.81)
RDW: 12.3 % (ref 11.5–15.5)
WBC: 6.5 10*3/uL (ref 4.0–10.5)
nRBC: 0 % (ref 0.0–0.2)

## 2022-08-22 LAB — LIPID PANEL
Cholesterol: 81 mg/dL (ref 0–200)
HDL: 36 mg/dL — ABNORMAL LOW (ref >40)
LDL Cholesterol: 26 mg/dL (ref 0–99)
Total CHOL/HDL Ratio: 2.3 RATIO
Triglycerides: 97 mg/dL (ref <150)
VLDL: 19 mg/dL (ref 0–40)

## 2022-08-22 LAB — HEPARIN LEVEL (UNFRACTIONATED): Heparin Unfractionated: 0.44 IU/mL (ref 0.30–0.70)

## 2022-08-22 LAB — MAGNESIUM: Magnesium: 2 mg/dL (ref 1.7–2.4)

## 2022-08-22 LAB — PROTIME-INR
INR: 1.5 — ABNORMAL HIGH (ref 0.8–1.2)
Prothrombin Time: 17.6 seconds — ABNORMAL HIGH (ref 11.4–15.2)

## 2022-08-22 MED ORDER — METOPROLOL SUCCINATE ER 25 MG PO TB24
25.0000 mg | ORAL_TABLET | Freq: Two times a day (BID) | ORAL | Status: DC
  Administered 2022-08-22 – 2022-08-23 (×4): 25 mg via ORAL
  Filled 2022-08-22 (×4): qty 1

## 2022-08-22 MED ORDER — WARFARIN SODIUM 5 MG PO TABS
12.0000 mg | ORAL_TABLET | Freq: Once | ORAL | Status: AC
  Administered 2022-08-22: 12 mg via ORAL
  Filled 2022-08-22: qty 1

## 2022-08-22 NOTE — Progress Notes (Signed)
Pt had a 2.32 second pause, resting comfortably, asymptomatic, and still in atrial fibrillation. Humphrey Rolls, MD paged. Will continue to monitor.  Elaina Hoops, RN

## 2022-08-22 NOTE — Progress Notes (Signed)
[place] for Heparin and warfarin Indication: chest pain/ACS, afib  Allergies  Allergen Reactions   Grass Extracts [Gramineae Pollens] Other (See Comments)    Sneezing and runny nose   Milk-Related Compounds Other (See Comments)    Just Yogurt.  Makes him very lethargic.   Amiodarone Anxiety    depression    Patient Measurements: Height: 5\' 10"  (177.8 cm) Weight: 83.6 kg (184 lb 6.4 oz) IBW/kg (Calculated) : 73 Heparin Dosing Weight: 87 kg  Vital Signs: Temp: 97.5 F (36.4 C) (09/09 0514) Temp Source: Oral (09/09 0514) BP: 101/74 (09/09 0514) Pulse Rate: 49 (09/09 0514)  Labs: Recent Labs    08/20/22 1000 08/20/22 1212 08/20/22 2151 08/21/22 0704 08/22/22 0254  HGB 14.1  --   --  13.6 13.7  HCT 43.6  --   --  40.8 41.1  PLT 195  --   --  162 137*  LABPROT  --  21.0*  --  18.9* 17.6*  INR  --  1.8*  --  1.6* 1.5*  HEPARINUNFRC  --   --  0.19* 0.38 0.44  CREATININE 1.28*  --   --  1.24 1.12  TROPONINIHS 3,666* 3,866*  --   --   --      Estimated Creatinine Clearance: 47.1 mL/min (by C-G formula based on SCr of 1.12 mg/dL).   Medical History: Past Medical History:  Diagnosis Date   AAA (abdominal aortic aneurysm) (HCC)    Atrial fibrillation (HCC)    Atrial fibrillation (HCC)    CAD (coronary artery disease)    Cataract    CHF (congestive heart failure) (HCC)    Chronic kidney disease    Colon polyps    adenomatous   Diabetes mellitus    Diverticulosis    Dysrhythmia    A-Fib   GERD (gastroesophageal reflux disease)    Gout    Hernia, abdominal    History of aortic valve replacement with bioprosthetic valve    2007   Hx: UTI (urinary tract infection)    Hyperlipemia    Hypertension    Internal hemorrhoids    Peripheral vascular disease (HCC)    Shortness of breath    with exertion    Assessment: 88 y.o. M presents with ACS and afib. Pt on warfarin PTA for afib. Admission INR 1.8. Pharmacy consulted to  dose heparin gtt. Patient is s/p heart cath on 9/8 showing occlusion of SVG. Team planning to manage medically and will restart warfarin. Plan for DCCV on 9/11.  Heparin level therapeutic at 0.44. INR is subtherapeutic at 1.5. No issues with infusion reported, Hgb stable, plt 137. No signs or symptoms of bleeding noted.  Goal of Therapy:  Heparin level 0.3-0.7 units/ml INR goal 2-3 Monitor platelets by anticoagulation protocol: Yes   Plan:  Continue heparin gtt at 1300 units/hr Give warfarin 12 mg x1 dose Monitor daily heparin level, INR, and CBC Monitor for s/sx of bleeding  Louanne Belton, PharmD, [facility] PGY1 Pharmacy Resident 08/22/2022 8:19 AM

## 2022-08-22 NOTE — Progress Notes (Addendum)
Paged cardiology, QTC 508 this AM.   Spoke with cardiology and verbally advised that pt is okay to receive Tikosyn this AM.

## 2022-08-22 NOTE — Progress Notes (Signed)
Paged cardiology concerning hypotension. Pt is asymptomatic, will continue to check on blood pressure.

## 2022-08-22 NOTE — Progress Notes (Signed)
   Received page with concern for Qtc given patient is on Tikosyn. EKG this morning shows atrial fibrillation with Qtc of 508s. QRS interval 150. Reviewed EKG with Dr. Quentin Ore. Automatic read is overestimating QTc, especially when you correct for QRS length. OK to proceed with Tikosyn dosing.  Darreld Mclean, PA-C 08/22/2022 8:11 AM

## 2022-08-22 NOTE — Progress Notes (Signed)
Mobility Specialist - Progress Note   08/22/22 1132  Mobility  Activity Ambulated with assistance in hallway  Level of Assistance Contact guard assist, steadying assist  Assistive Device Other (Comment) (Iv pole/hand rails)  Distance Ambulated (ft) 360 ft  Activity Response Tolerated fair  $Mobility charge 1 Mobility    During mobility: 123 HR Post-mobility:116 HR, 98% SpO2  Pt was received in bed and agreeable to mobility. Pt required x1 rest break d/t SOB. Pt was returned to bed with all needs met.   Larey Seat

## 2022-08-22 NOTE — Progress Notes (Signed)
His metoprolol was not given due to low BP (89/64). Repeat BP on opposite arm (66/49). Patient pull up and reposition in bed and BP came up to 103/63. Clinical instructor present and follow with nurse caring for the patient.

## 2022-08-22 NOTE — Progress Notes (Signed)
Patient ID: Nathaniel [name]., male   DOB: 12/15/1931, 88 y.o.   MRN: [id number]     Advanced Heart Failure Rounding Note  PCP-Cardiologist: None   Subjective:    Presented with worsening CP and exertional dyspnea for ~3 days, found to have NSTEMI and to be in atrial fibrillation with RVR.   LHC/RHC 9/8:  1. Normal filling pressures.  2. Known occlusion of native coronaries and SVG-RCA from original CABG.  3. New occlusion of SVG-PDA from CABG#2.  4. Long 99% stenosis in the SVG-ramus (was sequential SVG-ramus/OM/D but OM and D branches noted to be lost on prior cath).  Ramus is relatively small vessel. Medical management.  5. Patent LIMA-LAD with collaterals to RCA territory.   HR 100s this morning, still in atrial fibrillation. SBP around 100. No chest pain or dyspnea.   Objective:   Weight Range: 83.6 kg Body mass index is 26.46 kg/m.   Vital Signs:   Temp:  [97.5 F (36.4 C)-97.7 F (36.5 C)] 97.7 F (36.5 C) (09/09 0946) Pulse Rate:  [49-130] 93 (09/09 0946) Resp:  [16-18] 18 (09/09 0946) BP: (89-113)/(52-81) 89/64 (09/09 0946) SpO2:  [93 %-99 %] 99 % (09/09 0946) Weight:  [83.6 kg] 83.6 kg (09/09 0514) Last BM Date : 08/21/22  Weight change: Filed Weights   08/20/22 2144 08/21/22 0500 08/22/22 0514  Weight: 84.7 kg 84.4 kg 83.6 kg    Intake/Output:   Intake/Output Summary (Last 24 hours) at 08/22/2022 1031 Last data filed at 08/22/2022 0448 Gross per 24 hour  Intake 1370.23 ml  Output --  Net 1370.23 ml     Physical Exam    General: NAD Neck: No JVD, no thyromegaly or thyroid nodule.  Lungs: Clear to auscultation bilaterally with normal respiratory effort. CV: Nondisplaced PMI.  Heart irregular S1/S2, no S3/S4, no murmur.  No peripheral edema.   Abdomen: Soft, nontender, no hepatosplenomegaly, no distention.  Skin: Intact without lesions or rashes.  Neurologic: Alert and oriented x 3.  Psych: Normal affect. Extremities: No clubbing or cyanosis.  HEENT:  Normal.   Telemetry   A fib 100s (personally reviewed)  Labs    CBC Recent Labs    08/21/22 0704 08/22/22 0254  WBC 7.2 6.5  HGB 13.6 13.7  HCT 40.8 41.1  MCV 91.1 91.1  PLT 162 578*   Basic Metabolic Panel Recent Labs    08/21/22 0704 08/22/22 0254  NA 137 134*  K 4.4 4.4  CL 101 102  CO2 24 24  GLUCOSE 149* 119*  BUN 22 17  CREATININE 1.24 1.12  CALCIUM 9.3 8.9  MG 1.9 2.0   Liver Function Tests Recent Labs    08/20/22 1000  AST 52*  ALT 18  ALKPHOS 46  BILITOT 1.1  PROT 6.9  ALBUMIN 3.7   No results for input(s): "LIPASE", "AMYLASE" in the last 72 hours. Cardiac Enzymes No results for input(s): "CKTOTAL", "CKMB", "CKMBINDEX", "TROPONINI" in the last 72 hours.  BNP: BNP (last 3 results) Recent Labs    03/06/22 1400 07/15/22 1206 08/20/22 1230  BNP 172.0* 164.3* 704.2*    ProBNP (last 3 results) No results for input(s): "PROBNP" in the last 8760 hours.   D-Dimer No results for input(s): "DDIMER" in the last 72 hours. Hemoglobin A1C No results for input(s): "HGBA1C" in the last 72 hours. Fasting Lipid Panel Recent Labs    08/22/22 0254  CHOL 81  HDL 36*  LDLCALC 26  TRIG 97  CHOLHDL 2.3  Thyroid Function Tests No results for input(s): "TSH", "T4TOTAL", "T3FREE", "THYROIDAB" in the last 72 hours.  Invalid input(s): "FREET3"  Other results:   Imaging    CARDIAC CATHETERIZATION  Result Date: 08/21/2022   Ost RCA to Prox RCA lesion is 100% stenosed.   Mid LM to Prox LAD lesion is 100% stenosed.   Ost Cx to Prox Cx lesion is 100% stenosed.   Origin lesion is 100% stenosed.   Origin lesion is 100% stenosed.   Prox Graft to Mid Graft lesion is 99% stenosed.   Dist RCA lesion is 100% stenosed. 1. Normal filling pressures. 2. Known occlusion of native coronaries and SVG-RCA from original CABG. 3. New occlusion of SVG-PDA from CABG#2. 4. Long 99% stenosis in the SVG-ramus (was sequential SVG-ramus/OM/D but OM and D branches noted to be  lost on prior cath).  Ramus is relatively small vessel. 5. Patent LIMA-LAD with collaterals to RCA territory. Discussed with Dr. Ali Lowe, probably not much benefit to intervention on SVG-ramus.  Small territory covered and complex disease in SVG. Medical management.     Medications:     Scheduled Medications:  aspirin EC  81 mg Oral Daily   atorvastatin  20 mg Oral Daily   dofetilide  250 mcg Oral BID   dorzolamide-timolol  1 drop Both Eyes BID   ezetimibe  10 mg Oral QHS   latanoprost  1 drop Left Eye QHS   metoprolol succinate  25 mg Oral BID   sodium chloride flush  3 mL Intravenous Q12H   sodium chloride flush  3 mL Intravenous Q12H   sodium chloride flush  3 mL Intravenous Q12H   tamsulosin  0.4 mg Oral BID   Warfarin - Pharmacist Dosing Inpatient   Does not apply q1600    Infusions:  sodium chloride     sodium chloride     heparin 1,300 Units/hr (08/22/22 0448)    PRN Medications: sodium chloride, sodium chloride, acetaminophen, ondansetron (ZOFRAN) IV, sodium chloride flush, sodium chloride flush    Patient Profile  88 y.o. male with history of extensive vascular disease including CAD s/p CABG and redo CABG w/ AVR, AAA s/p endovascular repair in 2015, PAD, carotid stenosis s/p Rt CEA, chronic systolic and diastolic heart failure as well as difficult to control paroxsymal atrial fibrillation, recently diagnosed with stage IA2 NSCLC RUL treated w/ SBRT, presenting w/ unstable angina, ruled in for NSTEMI. Back in Afib w/ CVR.   Assessment/Plan   1.  NSTEMI:  Known CAD s/p CABG and Redo CABG. LHC 06/2014 showed significant coronary disease w/ no good interventional targets (LIMA-LAD patent with 40-50% stenosis in LAD after touchdown, sequential SVG-ramus/OM/diagonal with only the ramus branch still intact (known from prior cath), SVG-PDA from original surgery TO, SVG-PDA from redo surgery with long 50-60% mid-graft stenosis). NSTEMI this admission, repeat cath showed occlusion  of the 2nd SVG-PDA and 99% stenosis in the SVG-ramus.  Reviewed with Dr. Ali Lowe, probably not much benefit to intervention on SVG-ramus.  Small territory covered and complex disease in the SVG.  Plan medical management. LDL excellent at 26.  - Continue ASA 81 daily for now, can stop at discharge when INR therapeutic on warfarin.  - continue Atorva 20 QOD + Zetia. Continue Repatha at d/c  2.  Chronic HF with mid-range EF: Echo this admission with stable EF 45-50%, LV global hypokinesis, RV systolic function normal. Filling pressures normal on RHC this admission, does not look volume overloaded on exam.  - Hold ramipril  2.5 mg bid and spironolactone with low BP.  - Off Jardiance with E coli UTI.  - Continue Toprol XL at 25 mg BID 3. Atrial fibrillation: Difficult to control, failed multiple cardioversions.  Failed amiodarone due to CNS SE, feels strongly against trying again. Offered ablation by EP but he declined. Had been primarily in NSR on Tikosyn.  In setting of NSTEMI, he developed recurrent AF.  Remains in AF This morning.  - on Tikosyn, will continue. ECG reviewed this morning by Dr. Quentin Ore, QTc ok.  - Continue heparin gtt + warfarin while INR subtherapeutic (1.5 today).  - TEE-guided DCCV on Monday.  - Rate reasonable this morning in 100s, continue Toprol XL 25 mg bid.  4. NSCLC RUL: Completed SBRT.  Followed by radiation oncology. Followup CT in 8/23 reviewed with pulmonary, no acute change and will be followed as outpatient.  5. Bioprosthetic AVR: Stable on echo this admission.  6. Type 2DM: Recent A1c 6.9  - SSI  - off SLGT2i given UTIs  7. PAD:  AAA s/p EVR, LE PAD + Carotid Artery Disease s/p Rt CEA  - No current claudication symptoms.  - followed by Dr. Trula Slade  - continue ASA, lipid lowering agents per above   [name] 08/22/2022

## 2022-08-23 LAB — CBC
HCT: 38.7 % — ABNORMAL LOW (ref 39.0–52.0)
Hemoglobin: 13 g/dL (ref 13.0–17.0)
MCH: 30.6 pg (ref 26.0–34.0)
MCHC: 33.6 g/dL (ref 30.0–36.0)
MCV: 91.1 fL (ref 80.0–100.0)
Platelets: 146 10*3/uL — ABNORMAL LOW (ref 150–400)
RBC: 4.25 MIL/uL (ref 4.22–5.81)
RDW: 12.3 % (ref 11.5–15.5)
WBC: 7.8 10*3/uL (ref 4.0–10.5)
nRBC: 0 % (ref 0.0–0.2)

## 2022-08-23 LAB — BASIC METABOLIC PANEL
Anion gap: 9 (ref 5–15)
BUN: 17 mg/dL (ref 8–23)
CO2: 26 mmol/L (ref 22–32)
Calcium: 8.9 mg/dL (ref 8.9–10.3)
Chloride: 101 mmol/L (ref 98–111)
Creatinine, Ser: 1.17 mg/dL (ref 0.61–1.24)
GFR, Estimated: 60 mL/min — ABNORMAL LOW (ref >60)
Glucose, Bld: 150 mg/dL — ABNORMAL HIGH (ref 70–99)
Potassium: 4.2 mmol/L (ref 3.5–5.1)
Sodium: 136 mmol/L (ref 135–145)

## 2022-08-23 LAB — HEPARIN LEVEL (UNFRACTIONATED): Heparin Unfractionated: 0.39 IU/mL (ref 0.30–0.70)

## 2022-08-23 LAB — PROTIME-INR
INR: 2 — ABNORMAL HIGH (ref 0.8–1.2)
INR: 2.2 — ABNORMAL HIGH (ref 0.8–1.2)
Prothrombin Time: 22.5 seconds — ABNORMAL HIGH (ref 11.4–15.2)
Prothrombin Time: 24.1 seconds — ABNORMAL HIGH (ref 11.4–15.2)

## 2022-08-23 LAB — MAGNESIUM: Magnesium: 1.9 mg/dL (ref 1.7–2.4)

## 2022-08-23 MED ORDER — SODIUM CHLORIDE 0.45 % IV SOLN
INTRAVENOUS | Status: DC
Start: 2022-08-23 — End: 2022-08-24
  Administered 2022-08-24: 20 mL/h via INTRAVENOUS

## 2022-08-23 MED ORDER — MAGNESIUM SULFATE 2 GM/50ML IV SOLN
2.0000 g | Freq: Once | INTRAVENOUS | Status: AC
  Administered 2022-08-23: 2 g via INTRAVENOUS
  Filled 2022-08-23: qty 50

## 2022-08-23 MED ORDER — WARFARIN SODIUM 5 MG PO TABS
9.0000 mg | ORAL_TABLET | Freq: Once | ORAL | Status: AC
  Administered 2022-08-23: 9 mg via ORAL
  Filled 2022-08-23: qty 2

## 2022-08-23 NOTE — H&P (View-Only) (Signed)
Patient ID: Nathaniel Cross., male   DOB: Apr 27, 1932, 88 y.o.   MRN: [id number]     Advanced Heart Failure Rounding Note  PCP-Cardiologist: None   Subjective:    Presented with worsening CP and exertional dyspnea for ~3 days, found to have NSTEMI and to be in atrial fibrillation with RVR.   LHC/RHC 9/8:  1. Normal filling pressures.  2. Known occlusion of native coronaries and SVG-RCA from original CABG.  3. New occlusion of SVG-PDA from CABG#2.  4. Long 99% stenosis in the SVG-ramus (was sequential SVG-ramus/OM/D but OM and D branches noted to be lost on prior cath).  Ramus is relatively small vessel. Medical management.  5. Patent LIMA-LAD with collaterals to RCA territory.   HR 90s this morning, still in atrial fibrillation. SBP around 100. No chest pain or dyspnea.   Objective:   Weight Range: 83.3 kg Body mass index is 26.36 kg/m.   Vital Signs:   Temp:  [97.7 F (36.5 C)-98.7 F (37.1 C)] 98.1 F (36.7 C) (09/10 0514) Pulse Rate:  [63-109] 96 (09/10 0920) Resp:  [16-18] 18 (09/10 0514) BP: (89-105)/(56-82) 105/71 (09/10 0920) SpO2:  [93 %-99 %] 96 % (09/10 0514) Weight:  [83.3 kg] 83.3 kg (09/10 0514) Last BM Date : 08/22/22  Weight change: Filed Weights   08/21/22 0500 08/22/22 0514 08/23/22 0514  Weight: 84.4 kg 83.6 kg 83.3 kg    Intake/Output:   Intake/Output Summary (Last 24 hours) at 08/23/2022 0929 Last data filed at 08/22/2022 1700 Gross per 24 hour  Intake 628.73 ml  Output --  Net 628.73 ml     Physical Exam    General: NAD Neck: No JVD, no thyromegaly or thyroid nodule.  Lungs: Clear to auscultation bilaterally with normal respiratory effort. CV: Nondisplaced PMI.  Heart irregular S1/S2, no S3/S4, no murmur.  No peripheral edema.   Abdomen: Soft, nontender, no hepatosplenomegaly, no distention.  Skin: Intact without lesions or rashes.  Neurologic: Alert and oriented x 3.  Psych: Normal affect. Extremities: No clubbing or cyanosis.  HEENT:  Normal.   Telemetry   A fib 90s-100s (personally reviewed)  Labs    CBC Recent Labs    08/22/22 0254 08/23/22 0419  WBC 6.5 7.8  HGB 13.7 13.0  HCT 41.1 38.7*  MCV 91.1 91.1  PLT 137* 263*   Basic Metabolic Panel Recent Labs    08/22/22 0254 08/23/22 0419  NA 134* 136  K 4.4 4.2  CL 102 101  CO2 24 26  GLUCOSE 119* 150*  BUN 17 17  CREATININE 1.12 1.17  CALCIUM 8.9 8.9  MG 2.0 1.9   Liver Function Tests Recent Labs    08/20/22 1000  AST 52*  ALT 18  ALKPHOS 46  BILITOT 1.1  PROT 6.9  ALBUMIN 3.7   No results for input(s): "LIPASE", "AMYLASE" in the last 72 hours. Cardiac Enzymes No results for input(s): "CKTOTAL", "CKMB", "CKMBINDEX", "TROPONINI" in the last 72 hours.  BNP: BNP (last 3 results) Recent Labs    03/06/22 1400 07/15/22 1206 08/20/22 1230  BNP 172.0* 164.3* 704.2*    ProBNP (last 3 results) No results for input(s): "PROBNP" in the last 8760 hours.   D-Dimer No results for input(s): "DDIMER" in the last 72 hours. Hemoglobin A1C No results for input(s): "HGBA1C" in the last 72 hours. Fasting Lipid Panel Recent Labs    08/22/22 0254  CHOL 81  HDL 36*  LDLCALC 26  TRIG 97  CHOLHDL 2.3  Thyroid Function Tests No results for input(s): "TSH", "T4TOTAL", "T3FREE", "THYROIDAB" in the last 72 hours.  Invalid input(s): "FREET3"  Other results:   Imaging    No results found.   Medications:     Scheduled Medications:  aspirin EC  81 mg Oral Daily   atorvastatin  20 mg Oral Daily   dofetilide  250 mcg Oral BID   dorzolamide-timolol  1 drop Both Eyes BID   ezetimibe  10 mg Oral QHS   latanoprost  1 drop Left Eye QHS   metoprolol succinate  25 mg Oral BID   sodium chloride flush  3 mL Intravenous Q12H   sodium chloride flush  3 mL Intravenous Q12H   sodium chloride flush  3 mL Intravenous Q12H   tamsulosin  0.4 mg Oral BID   warfarin  9 mg Oral ONCE-1600   Warfarin - Pharmacist Dosing Inpatient   Does not apply  q1600    Infusions:  sodium chloride     sodium chloride     magnesium sulfate bolus IVPB      PRN Medications: sodium chloride, sodium chloride, acetaminophen, ondansetron (ZOFRAN) IV, sodium chloride flush, sodium chloride flush    Patient Profile  88 y.o. male with history of extensive vascular disease including CAD s/p CABG and redo CABG w/ AVR, AAA s/p endovascular repair in 2015, PAD, carotid stenosis s/p Rt CEA, chronic systolic and diastolic heart failure as well as difficult to control paroxsymal atrial fibrillation, recently diagnosed with stage IA2 NSCLC RUL treated w/ SBRT, presenting w/ unstable angina, ruled in for NSTEMI. Back in Afib w/ CVR.   Assessment/Plan   1.  NSTEMI:  Known CAD s/p CABG and Redo CABG. LHC 06/2014 showed significant coronary disease w/ no good interventional targets (LIMA-LAD patent with 40-50% stenosis in LAD after touchdown, sequential SVG-ramus/OM/diagonal with only the ramus branch still intact (known from prior cath), SVG-PDA from original surgery TO, SVG-PDA from redo surgery with long 50-60% mid-graft stenosis). NSTEMI this admission, repeat cath showed occlusion of the 2nd SVG-PDA and 99% stenosis in the SVG-ramus.  Reviewed with Dr. Ali Lowe, probably not much benefit to intervention on SVG-ramus.  Small territory covered and complex disease in the SVG.  Plan medical management. LDL excellent at 26.  - Continue ASA 81 daily for now, can stop at discharge when INR therapeutic on warfarin.  - continue Atorva 20 QOD + Zetia. Continue Repatha at d/c  2.  Chronic HF with mid-range EF: Echo this admission with stable EF 45-50%, LV global hypokinesis, RV systolic function normal. Filling pressures normal on RHC this admission, does not look volume overloaded on exam.  - Hold ramipril 2.5 mg bid and spironolactone with low BP.  - Off Jardiance with E coli UTI.  - Continue Toprol XL at 25 mg BID 3. Atrial fibrillation: Difficult to control, failed  multiple cardioversions.  Failed amiodarone due to CNS SE, feels strongly against trying again. Offered ablation by EP but he declined. Had been primarily in NSR on Tikosyn.  In setting of NSTEMI, he developed recurrent AF.  Remains in AF this morning.  - on Tikosyn, will continue. QTc ok on this morning's ECG.  - INR 2 on warfarin, can stop heparin gtt.   - TEE-guided DCCV on Monday. Discussed risks/benefits with patient and he agrees to procedure.  - Rate reasonable this morning in 90s-100s, continue Toprol XL 25 mg bid.  4. NSCLC RUL: Completed SBRT.  Followed by radiation oncology. Followup CT in 8/23 reviewed  with pulmonary, no acute change and will be followed as outpatient.  5. Bioprosthetic AVR: Stable on echo this admission.  6. Type 2DM: Recent A1c 6.9  - SSI  - off SLGT2i given UTIs  7. PAD:  AAA s/p EVR, LE PAD + Carotid Artery Disease s/p Rt CEA  - No current claudication symptoms.  - followed by Dr. Trula Slade  - continue ASA, lipid lowering agents per above   [name] 08/23/2022

## 2022-08-23 NOTE — Progress Notes (Signed)
Paged cardiology and advised pt had 6 beat run of v tach. Pt asymptomatic.

## 2022-08-23 NOTE — Anesthesia Preprocedure Evaluation (Signed)
Anesthesia Evaluation  Patient identified by MRN, date of birth, ID band Patient awake    Reviewed: Allergy & Precautions, NPO status , Patient's Chart, lab work & pertinent test results, reviewed documented beta blocker date and time   Airway Mallampati: II  TM Distance: >3 FB Neck ROM: Full    Dental  (+) Dental Advisory Given, Chipped,    Pulmonary neg pulmonary ROS, former smoker,    Pulmonary exam normal breath sounds clear to auscultation       Cardiovascular hypertension, Pt. on home beta blockers and Pt. on medications + CAD, + Past MI, + CABG, + Peripheral Vascular Disease and +CHF  Normal cardiovascular examDysrhythmias: on coumadin. Atrial Fibrillation + Valvular Problems/Murmurs (s/p AVR)  Rhythm:Irregular Rate:Normal  AAA s/p EVAR 2015  TTE 2023 1. Pt in atrial fibrillation during the study.  2. Left ventricular ejection fraction, by estimation, is 45 to 50%. The  left ventricle has mildly decreased function. The left ventricle  demonstrates global hypokinesis. Left ventricular diastolic function could  not be evaluated.  3. Right ventricular systolic function is normal. The right ventricular  size is normal.  4. Left atrial size was moderately dilated.  5. The mitral valve is normal in structure. Mild mitral valve  regurgitation. No evidence of mitral stenosis. Moderate mitral annular  calcification.  6. The aortic valve has been repaired/replaced. Aortic valve  regurgitation is not visualized. No aortic stenosis is present. There is a  bioprosthetic valve present in the aortic position.  7. Aortic dilatation noted. There is mild dilatation of the ascending  aorta, measuring 40 mm.  8. The inferior vena cava is normal in size with greater than 50%  respiratory variability, suggesting right atrial pressure of 3 mmHg.  Cath 2023 1. Normal filling pressures.  2. Known occlusion of native coronaries and  SVG-RCA from original CABG.  3. New occlusion of SVG-PDA from CABG#2.  4. Long 99% stenosis in the SVG-ramus (was sequential SVG-ramus/OM/D but OM and D branches noted to be lost on prior cath).  Ramus is relatively small vessel.  5. Patent LIMA-LAD with collaterals to RCA territory.     Neuro/Psych negative neurological ROS  negative psych ROS   GI/Hepatic Neg liver ROS, GERD  ,  Endo/Other  diabetes, Type 2  Renal/GU   negative genitourinary   Musculoskeletal negative musculoskeletal ROS (+)   Abdominal   Peds  Hematology negative hematology ROS (+)   Anesthesia Other Findings   Reproductive/Obstetrics                            Anesthesia Physical Anesthesia Plan  ASA: 3  Anesthesia Plan: MAC   Post-op Pain Management:    Induction: Intravenous  PONV Risk Score and Plan: Propofol infusion and Treatment may vary due to age or medical condition  Airway Management Planned: Natural Airway  Additional Equipment:   Intra-op Plan:   Post-operative Plan:   Informed Consent: I have reviewed the patients History and Physical, chart, labs and discussed the procedure including the risks, benefits and alternatives for the proposed anesthesia with the patient or authorized representative who has indicated his/her understanding and acceptance.     Dental advisory given  Plan Discussed with: CRNA  Anesthesia Plan Comments:         Anesthesia Quick Evaluation

## 2022-08-23 NOTE — Progress Notes (Signed)
Miner for Heparin and warfarin Indication: chest pain/ACS, afib  Allergies  Allergen Reactions   Grass Extracts [Gramineae Pollens] Other (See Comments)    Sneezing and runny nose   Milk-Related Compounds Other (See Comments)    Just Yogurt.  Makes him very lethargic.   Amiodarone Anxiety    depression    Patient Measurements: Height: 5\' 10"  (177.8 cm) Weight: 83.3 kg (183 lb 11.2 oz) IBW/kg (Calculated) : 73 Heparin Dosing Weight: 87 kg  Vital Signs: Temp: 98.1 F (36.7 C) (09/10 0514) Temp Source: Oral (09/10 0514) BP: 93/68 (09/10 0514) Pulse Rate: 94 (09/10 0514)  Labs: Recent Labs     0000 08/20/22 1000 08/20/22 1212 08/20/22 2151 08/21/22 0704 08/22/22 0254 08/23/22 0419  HGB  --  14.1  --   --  13.6 13.7 13.0  HCT  --  43.6  --   --  40.8 41.1 38.7*  PLT  --  195  --   --  162 137* 146*  LABPROT   < >  --  21.0*  --  18.9* 17.6* 22.5*  INR   < >  --  1.8*  --  1.6* 1.5* 2.0*  HEPARINUNFRC  --   --   --    < > 0.38 0.44 0.39  CREATININE  --  1.28*  --   --  1.24 1.12 1.17  TROPONINIHS  --  3,666* 3,866*  --   --   --   --    < > = values in this interval not displayed.     Estimated Creatinine Clearance: 45.1 mL/min (by C-G formula based on SCr of 1.17 mg/dL).   Medical History: Past Medical History:  Diagnosis Date   AAA (abdominal aortic aneurysm) (HCC)    Atrial fibrillation (HCC)    Atrial fibrillation (HCC)    CAD (coronary artery disease)    Cataract    CHF (congestive heart failure) (HCC)    Chronic kidney disease    Colon polyps    adenomatous   Diabetes mellitus    Diverticulosis    Dysrhythmia    A-Fib   GERD (gastroesophageal reflux disease)    Gout    Hernia, abdominal    History of aortic valve replacement with bioprosthetic valve    2007   Hx: UTI (urinary tract infection)    Hyperlipemia    Hypertension    Internal hemorrhoids    Peripheral vascular disease (HCC)    Shortness  of breath    with exertion    Assessment: 88 y.o. M presents with ACS and afib. Pt on warfarin PTA for afib (takes 9 mg daily). Admission INR 1.8. Pharmacy consulted to dose heparin gtt. Patient is s/p heart cath on 9/8 showing occlusion of SVG. Team planning to manage medically and will restart warfarin. Plan for DCCV on 9/11.  Heparin level therapeutic at 0.39. INR is therapeutic at 2. No issues with infusion reported, Hgb stable, plt 146. No signs or symptoms of bleeding noted.  Goal of Therapy:  Heparin level 0.3-0.7 units/ml INR goal 2-3 Monitor platelets by anticoagulation protocol: Yes   Plan:  Continue heparin gtt at 1300 units/hr Give warfarin 9 mg x1 dose Monitor daily heparin level, INR, and CBC Monitor for s/sx of bleeding  Louanne Belton, PharmD, [facility] PGY1 Pharmacy Resident 08/23/2022 7:38 AM

## 2022-08-23 NOTE — Plan of Care (Signed)
  Problem: Cardiovascular: Goal: Ability to achieve and maintain adequate cardiovascular perfusion will improve Outcome: Progressing   Problem: Health Behavior/Discharge Planning: Goal: Ability to safely manage health-related needs after discharge will improve Outcome: Progressing   Problem: Activity: Goal: Ability to return to baseline activity level will improve Outcome: Progressing   Problem: Education: Goal: Knowledge of General Education information will improve Description: Including pain rating scale, medication(s)/side effects and non-pharmacologic comfort measures Outcome: Progressing   Problem: Safety: Goal: Ability to remain free from injury will improve Outcome: Progressing

## 2022-08-23 NOTE — Progress Notes (Signed)
Patient ID: Nathaniel [name]., male   DOB: 01-26-1932, 88 y.o.   MRN: [id number]     Advanced Heart Failure Rounding Note  PCP-Cardiologist: None   Subjective:    Presented with worsening CP and exertional dyspnea for ~3 days, found to have NSTEMI and to be in atrial fibrillation with RVR.   LHC/RHC 9/8:  1. Normal filling pressures.  2. Known occlusion of native coronaries and SVG-RCA from original CABG.  3. New occlusion of SVG-PDA from CABG#2.  4. Long 99% stenosis in the SVG-ramus (was sequential SVG-ramus/OM/D but OM and D branches noted to be lost on prior cath).  Ramus is relatively small vessel. Medical management.  5. Patent LIMA-LAD with collaterals to RCA territory.   HR 90s this morning, still in atrial fibrillation. SBP around 100. No chest pain or dyspnea.   Objective:   Weight Range: 83.3 kg Body mass index is 26.36 kg/m.   Vital Signs:   Temp:  [97.7 F (36.5 C)-98.7 F (37.1 C)] 98.1 F (36.7 C) (09/10 0514) Pulse Rate:  [63-109] 96 (09/10 0920) Resp:  [16-18] 18 (09/10 0514) BP: (89-105)/(56-82) 105/71 (09/10 0920) SpO2:  [93 %-99 %] 96 % (09/10 0514) Weight:  [83.3 kg] 83.3 kg (09/10 0514) Last BM Date : 08/22/22  Weight change: Filed Weights   08/21/22 0500 08/22/22 0514 08/23/22 0514  Weight: 84.4 kg 83.6 kg 83.3 kg    Intake/Output:   Intake/Output Summary (Last 24 hours) at 08/23/2022 0929 Last data filed at 08/22/2022 1700 Gross per 24 hour  Intake 628.73 ml  Output --  Net 628.73 ml     Physical Exam    General: NAD Neck: No JVD, no thyromegaly or thyroid nodule.  Lungs: Clear to auscultation bilaterally with normal respiratory effort. CV: Nondisplaced PMI.  Heart irregular S1/S2, no S3/S4, no murmur.  No peripheral edema.   Abdomen: Soft, nontender, no hepatosplenomegaly, no distention.  Skin: Intact without lesions or rashes.  Neurologic: Alert and oriented x 3.  Psych: Normal affect. Extremities: No clubbing or cyanosis.  HEENT:  Normal.   Telemetry   A fib 90s-100s (personally reviewed)  Labs    CBC Recent Labs    08/22/22 0254 08/23/22 0419  WBC 6.5 7.8  HGB 13.7 13.0  HCT 41.1 38.7*  MCV 91.1 91.1  PLT 137* 384*   Basic Metabolic Panel Recent Labs    08/22/22 0254 08/23/22 0419  NA 134* 136  K 4.4 4.2  CL 102 101  CO2 24 26  GLUCOSE 119* 150*  BUN 17 17  CREATININE 1.12 1.17  CALCIUM 8.9 8.9  MG 2.0 1.9   Liver Function Tests Recent Labs    08/20/22 1000  AST 52*  ALT 18  ALKPHOS 46  BILITOT 1.1  PROT 6.9  ALBUMIN 3.7   No results for input(s): "LIPASE", "AMYLASE" in the last 72 hours. Cardiac Enzymes No results for input(s): "CKTOTAL", "CKMB", "CKMBINDEX", "TROPONINI" in the last 72 hours.  BNP: BNP (last 3 results) Recent Labs    03/06/22 1400 07/15/22 1206 08/20/22 1230  BNP 172.0* 164.3* 704.2*    ProBNP (last 3 results) No results for input(s): "PROBNP" in the last 8760 hours.   D-Dimer No results for input(s): "DDIMER" in the last 72 hours. Hemoglobin A1C No results for input(s): "HGBA1C" in the last 72 hours. Fasting Lipid Panel Recent Labs    08/22/22 0254  CHOL 81  HDL 36*  LDLCALC 26  TRIG 97  CHOLHDL 2.3  Thyroid Function Tests No results for input(s): "TSH", "T4TOTAL", "T3FREE", "THYROIDAB" in the last 72 hours.  Invalid input(s): "FREET3"  Other results:   Imaging    No results found.   Medications:     Scheduled Medications:  aspirin EC  81 mg Oral Daily   atorvastatin  20 mg Oral Daily   dofetilide  250 mcg Oral BID   dorzolamide-timolol  1 drop Both Eyes BID   ezetimibe  10 mg Oral QHS   latanoprost  1 drop Left Eye QHS   metoprolol succinate  25 mg Oral BID   sodium chloride flush  3 mL Intravenous Q12H   sodium chloride flush  3 mL Intravenous Q12H   sodium chloride flush  3 mL Intravenous Q12H   tamsulosin  0.4 mg Oral BID   warfarin  9 mg Oral ONCE-1600   Warfarin - Pharmacist Dosing Inpatient   Does not apply  q1600    Infusions:  sodium chloride     sodium chloride     magnesium sulfate bolus IVPB      PRN Medications: sodium chloride, sodium chloride, acetaminophen, ondansetron (ZOFRAN) IV, sodium chloride flush, sodium chloride flush    Patient Profile  88 y.o. male with history of extensive vascular disease including CAD s/p CABG and redo CABG w/ AVR, AAA s/p endovascular repair in 2015, PAD, carotid stenosis s/p Rt CEA, chronic systolic and diastolic heart failure as well as difficult to control paroxsymal atrial fibrillation, recently diagnosed with stage IA2 NSCLC RUL treated w/ SBRT, presenting w/ unstable angina, ruled in for NSTEMI. Back in Afib w/ CVR.   Assessment/Plan   1.  NSTEMI:  Known CAD s/p CABG and Redo CABG. LHC 06/2014 showed significant coronary disease w/ no good interventional targets (LIMA-LAD patent with 40-50% stenosis in LAD after touchdown, sequential SVG-ramus/OM/diagonal with only the ramus branch still intact (known from prior cath), SVG-PDA from original surgery TO, SVG-PDA from redo surgery with long 50-60% mid-graft stenosis). NSTEMI this admission, repeat cath showed occlusion of the 2nd SVG-PDA and 99% stenosis in the SVG-ramus.  Reviewed with Dr. Ali Lowe, probably not much benefit to intervention on SVG-ramus.  Small territory covered and complex disease in the SVG.  Plan medical management. LDL excellent at 26.  - Continue ASA 81 daily for now, can stop at discharge when INR therapeutic on warfarin.  - continue Atorva 20 QOD + Zetia. Continue Repatha at d/c  2.  Chronic HF with mid-range EF: Echo this admission with stable EF 45-50%, LV global hypokinesis, RV systolic function normal. Filling pressures normal on RHC this admission, does not look volume overloaded on exam.  - Hold ramipril 2.5 mg bid and spironolactone with low BP.  - Off Jardiance with E coli UTI.  - Continue Toprol XL at 25 mg BID 3. Atrial fibrillation: Difficult to control, failed  multiple cardioversions.  Failed amiodarone due to CNS SE, feels strongly against trying again. Offered ablation by EP but he declined. Had been primarily in NSR on Tikosyn.  In setting of NSTEMI, he developed recurrent AF.  Remains in AF this morning.  - on Tikosyn, will continue. QTc ok on this morning's ECG.  - INR 2 on warfarin, can stop heparin gtt.   - TEE-guided DCCV on Monday. Discussed risks/benefits with patient and he agrees to procedure.  - Rate reasonable this morning in 90s-100s, continue Toprol XL 25 mg bid.  4. NSCLC RUL: Completed SBRT.  Followed by radiation oncology. Followup CT in 8/23 reviewed  with pulmonary, no acute change and will be followed as outpatient.  5. Bioprosthetic AVR: Stable on echo this admission.  6. Type 2DM: Recent A1c 6.9  - SSI  - off SLGT2i given UTIs  7. PAD:  AAA s/p EVR, LE PAD + Carotid Artery Disease s/p Rt CEA  - No current claudication symptoms.  - followed by Dr. Trula Slade  - continue ASA, lipid lowering agents per above   [name] 08/23/2022

## 2022-08-23 NOTE — Progress Notes (Signed)
Mobility Specialist - Progress Note   08/23/22 1341  Mobility  Activity Ambulated with assistance in hallway  Level of Assistance Contact guard assist, steadying assist  Assistive Device Front wheel walker  Distance Ambulated (ft) 360 ft  Activity Response Tolerated fair  $Mobility charge 1 Mobility    Post-mobility: 100%SpO2  Pt was received in bed and agreeable to mobility. Pt c/o SOB throughout ambulation and was at 100% SpO2 upon return to room. Pt was returned to bed with all needs met and RN notified.   Larey Seat

## 2022-08-23 NOTE — Progress Notes (Signed)
Paged cardiology on call, heart rate increasing to 120's in A.Fib.

## 2022-08-24 ENCOUNTER — Encounter (HOSPITAL_COMMUNITY)
Admission: EM | Disposition: A | Discharge status: Home / Self Care | Payer: Self-pay | Source: Home / Self Care | Attending: Cardiology

## 2022-08-24 ENCOUNTER — Inpatient Hospital Stay (HOSPITAL_COMMUNITY): Payer: Medicare Other | Admitting: Anesthesiology

## 2022-08-24 ENCOUNTER — Inpatient Hospital Stay (HOSPITAL_COMMUNITY): Payer: Medicare Other

## 2022-08-24 ENCOUNTER — Encounter (HOSPITAL_COMMUNITY): Payer: Self-pay | Admitting: Cardiology

## 2022-08-24 DIAGNOSIS — I110 Hypertensive heart disease with heart failure: Secondary | ICD-10-CM

## 2022-08-24 DIAGNOSIS — I4891 Unspecified atrial fibrillation: Secondary | ICD-10-CM

## 2022-08-24 DIAGNOSIS — I340 Nonrheumatic mitral (valve) insufficiency: Secondary | ICD-10-CM

## 2022-08-24 DIAGNOSIS — I252 Old myocardial infarction: Secondary | ICD-10-CM

## 2022-08-24 DIAGNOSIS — I2510 Atherosclerotic heart disease of native coronary artery without angina pectoris: Secondary | ICD-10-CM

## 2022-08-24 DIAGNOSIS — Z87891 Personal history of nicotine dependence: Secondary | ICD-10-CM

## 2022-08-24 DIAGNOSIS — I509 Heart failure, unspecified: Secondary | ICD-10-CM

## 2022-08-24 HISTORY — PX: CARDIOVERSION: SHX1299

## 2022-08-24 HISTORY — PX: TEE WITHOUT CARDIOVERSION: SHX5443

## 2022-08-24 LAB — BASIC METABOLIC PANEL
Anion gap: 7 (ref 5–15)
BUN: 17 mg/dL (ref 8–23)
CO2: 25 mmol/L (ref 22–32)
Calcium: 8.7 mg/dL — ABNORMAL LOW (ref 8.9–10.3)
Chloride: 103 mmol/L (ref 98–111)
Creatinine, Ser: 1.19 mg/dL (ref 0.61–1.24)
GFR, Estimated: 59 mL/min — ABNORMAL LOW (ref >60)
Glucose, Bld: 180 mg/dL — ABNORMAL HIGH (ref 70–99)
Potassium: 4.1 mmol/L (ref 3.5–5.1)
Sodium: 135 mmol/L (ref 135–145)

## 2022-08-24 LAB — GLUCOSE, CAPILLARY: Glucose-Capillary: 136 mg/dL — ABNORMAL HIGH (ref 70–99)

## 2022-08-24 LAB — POCT I-STAT EG7
Acid-Base Excess: 4 mmol/L — ABNORMAL HIGH (ref 0.0–2.0)
Bicarbonate: 29.8 mmol/L — ABNORMAL HIGH (ref 20.0–28.0)
Calcium, Ion: 1.22 mmol/L (ref 1.15–1.40)
HCT: 38 % — ABNORMAL LOW (ref 39.0–52.0)
Hemoglobin: 12.9 g/dL — ABNORMAL LOW (ref 13.0–17.0)
O2 Saturation: 63 %
Potassium: 4.6 mmol/L (ref 3.5–5.1)
Sodium: 137 mmol/L (ref 135–145)
TCO2: 31 mmol/L (ref 22–32)
pCO2, Ven: 49.8 mmHg (ref 44–60)
pH, Ven: 7.385 (ref 7.25–7.43)
pO2, Ven: 34 mmHg (ref 32–45)

## 2022-08-24 LAB — LIPOPROTEIN A (LPA): Lipoprotein (a): 249 nmol/L — ABNORMAL HIGH (ref <75.0)

## 2022-08-24 LAB — CBC
HCT: 37.2 % — ABNORMAL LOW (ref 39.0–52.0)
Hemoglobin: 12.2 g/dL — ABNORMAL LOW (ref 13.0–17.0)
MCH: 30.3 pg (ref 26.0–34.0)
MCHC: 32.8 g/dL (ref 30.0–36.0)
MCV: 92.3 fL (ref 80.0–100.0)
Platelets: 165 10*3/uL (ref 150–400)
RBC: 4.03 MIL/uL — ABNORMAL LOW (ref 4.22–5.81)
RDW: 12.4 % (ref 11.5–15.5)
WBC: 7.3 10*3/uL (ref 4.0–10.5)
nRBC: 0 % (ref 0.0–0.2)

## 2022-08-24 LAB — PROTIME-INR
INR: 3.3 — ABNORMAL HIGH (ref 0.8–1.2)
Prothrombin Time: 32.9 seconds — ABNORMAL HIGH (ref 11.4–15.2)

## 2022-08-24 LAB — MAGNESIUM: Magnesium: 2.1 mg/dL (ref 1.7–2.4)

## 2022-08-24 SURGERY — CARDIOVERSION
Anesthesia: Monitor Anesthesia Care

## 2022-08-24 MED ORDER — METOPROLOL SUCCINATE ER 25 MG PO TB24: 25.0000 mg | ORAL_TABLET | Freq: Every day | ORAL | 3 refills | Status: DC

## 2022-08-24 MED ORDER — WARFARIN SODIUM 5 MG PO TABS: 6.0000 mg | ORAL_TABLET | Freq: Once | ORAL | Status: DC

## 2022-08-24 MED ORDER — METOPROLOL SUCCINATE ER 25 MG PO TB24
25.0000 mg | ORAL_TABLET | Freq: Every day | ORAL | Status: DC
  Filled 2022-08-24: qty 1

## 2022-08-24 MED ORDER — WARFARIN SODIUM 3 MG PO TABS: ORAL_TABLET | ORAL | 2 refills | Status: DC

## 2022-08-24 MED ORDER — FUROSEMIDE 20 MG PO TABS: 40.0000 mg | ORAL_TABLET | Freq: Every day | ORAL | 5 refills | Status: DC

## 2022-08-24 MED ORDER — FUROSEMIDE 40 MG PO TABS
40.0000 mg | ORAL_TABLET | Freq: Every day | ORAL | Status: DC
  Administered 2022-08-24: 40 mg via ORAL
  Filled 2022-08-24: qty 1

## 2022-08-24 MED ORDER — PROPOFOL 500 MG/50ML IV EMUL
INTRAVENOUS | Status: DC | PRN
  Administered 2022-08-24: 100 ug/kg/min via INTRAVENOUS

## 2022-08-24 MED ORDER — SPIRONOLACTONE 25 MG PO TABS
25.0000 mg | ORAL_TABLET | Freq: Every day | ORAL | Status: DC
  Administered 2022-08-24: 25 mg via ORAL
  Filled 2022-08-24: qty 1

## 2022-08-24 MED ORDER — WARFARIN SODIUM 1 MG PO TABS
1.5000 mg | ORAL_TABLET | Freq: Once | ORAL | Status: AC
  Administered 2022-08-24: 1.5 mg via ORAL
  Filled 2022-08-24 (×2): qty 1

## 2022-08-24 MED ORDER — RAMIPRIL 2.5 MG PO CAPS: 2.5000 mg | ORAL_CAPSULE | Freq: Every day | ORAL | Status: DC

## 2022-08-24 MED ORDER — RAMIPRIL 2.5 MG PO CAPS: 2.5000 mg | ORAL_CAPSULE | Freq: Every day | ORAL | 3 refills | Status: DC

## 2022-08-24 MED ORDER — ATORVASTATIN CALCIUM 20 MG PO TABS: 20.0000 mg | ORAL_TABLET | Freq: Every day | ORAL | 5 refills | Status: DC

## 2022-08-24 MED ORDER — RAMIPRIL 2.5 MG PO CAPS: 2.5000 mg | ORAL_CAPSULE | Freq: Two times a day (BID) | ORAL | 4 refills | Status: DC

## 2022-08-24 MED ORDER — PHENYLEPHRINE 80 MCG/ML (10ML) SYRINGE FOR IV PUSH (FOR BLOOD PRESSURE SUPPORT)
PREFILLED_SYRINGE | INTRAVENOUS | Status: DC | PRN
  Administered 2022-08-24 (×3): 160 ug via INTRAVENOUS

## 2022-08-24 MED ORDER — EPHEDRINE SULFATE-NACL 50-0.9 MG/10ML-% IV SOSY
PREFILLED_SYRINGE | INTRAVENOUS | Status: DC | PRN
  Administered 2022-08-24: 5 mg via INTRAVENOUS

## 2022-08-24 MED ORDER — WARFARIN SODIUM 5 MG PO TABS: 5.0000 mg | ORAL_TABLET | Freq: Once | ORAL | Status: DC

## 2022-08-24 MED FILL — Heparin Sodium (Porcine) Inj 1000 Unit/ML: INTRAMUSCULAR | Qty: 10 | Status: AC

## 2022-08-24 MED FILL — Verapamil HCl IV Soln 2.5 MG/ML: INTRAVENOUS | Qty: 2 | Status: AC

## 2022-08-24 NOTE — Care Management Important Message (Signed)
Important Message  Patient Details  Name: Nathaniel [name]. MRN: [id number] Date of Birth: Oct 05, 1932   Medicare Important Message Given:  Yes     Shelda Altes 08/24/2022, 10:07 AM

## 2022-08-24 NOTE — Transfer of Care (Signed)
Immediate Anesthesia Transfer of Care Note  Patient: Nathaniel [name].  Procedure(s) Performed: CARDIOVERSION TRANSESOPHAGEAL ECHOCARDIOGRAM (TEE)  Patient Location: PACU  Anesthesia Type:MAC  Level of Consciousness: awake, alert  and oriented  Airway & Oxygen Therapy: Patient Spontanous Breathing and Patient connected to nasal cannula oxygen  Post-op Assessment: Report given to RN and Post -op Vital signs reviewed and stable  Post vital signs: stable  Last Vitals:  Vitals Value Taken Time  BP 79/51 08/24/22 0900  Temp    Pulse 49 08/24/22 0901  Resp 24 08/24/22 0901  SpO2 98 % 08/24/22 0901  Vitals shown include unvalidated device data.  Last Pain:  Vitals:   08/24/22 0757  TempSrc: Temporal  PainSc: 0-No pain      Patients Stated Pain Goal: 0 (63/01/60 1093)  Complications: No notable events documented.

## 2022-08-24 NOTE — Anesthesia Procedure Notes (Signed)
Procedure Name: MAC Date/Time: 08/24/2022 8:25 AM  Performed by: Griffin Dakin, CRNAPre-anesthesia Checklist: Patient identified, Emergency Drugs available, Suction available, Patient being monitored and Timeout performed Patient Re-evaluated:Patient Re-evaluated prior to induction Oxygen Delivery Method: Nasal cannula Induction Type: IV induction Placement Confirmation: breath sounds checked- equal and bilateral and positive ETCO2 Dental Injury: Teeth and Oropharynx as per pre-operative assessment

## 2022-08-24 NOTE — Procedures (Addendum)
Electrical Cardioversion Procedure Note Coltrane Tugwell [id number] 10-01-1932  Procedure: Electrical Cardioversion Indications:  Atrial Fibrillation  Procedure Details Consent: Risks of procedure as well as the alternatives and risks of each were explained to the (patient/caregiver).  Consent for procedure obtained. Time Out: Verified patient identification, verified procedure, site/side was marked, verified correct patient position, special equipment/implants available, medications/allergies/relevent history reviewed, required imaging and test results available.  Performed  Patient placed on cardiac monitor, pulse oximetry, supplemental oxygen as necessary.  Sedation given:  Propofol per anesthesiology Pacer pads placed anterior and posterior chest.  Cardioverted 1 time(s).  Cardioverted at [place].  Evaluation Findings: Post procedure EKG shows: NSR Complications: None Patient did tolerate procedure well.   [name] 08/24/2022, 8:46 AM

## 2022-08-24 NOTE — Anesthesia Postprocedure Evaluation (Signed)
Anesthesia Post Note  Patient: Nathaniel [name].  Procedure(s) Performed: CARDIOVERSION TRANSESOPHAGEAL ECHOCARDIOGRAM (TEE)     Patient location during evaluation: Endoscopy Anesthesia Type: MAC Level of consciousness: awake and alert Pain management: pain level controlled Vital Signs Assessment: post-procedure vital signs reviewed and stable Respiratory status: spontaneous breathing, nonlabored ventilation, respiratory function stable and patient connected to nasal cannula oxygen Cardiovascular status: blood pressure returned to baseline and stable Postop Assessment: no apparent nausea or vomiting Anesthetic complications: no   No notable events documented.  Last Vitals:  Vitals:   08/24/22 1058 08/24/22 1300  BP: (!) 96/51 (!) 98/50  Pulse: (!) 50 61  Resp:  18  Temp:  36.6 C  SpO2: 95% 98%    Last Pain:  Vitals:   08/24/22 1300  TempSrc: Oral  PainSc:                  [name]

## 2022-08-24 NOTE — Discharge Summary (Signed)
Advanced Heart Failure Team  Discharge Summary   Patient ID: Nathaniel [name]. MRN: [id number], DOB/AGE: 01-24-1932 88 y.o. Admit date: 08/20/2022 D/C date:     08/24/2022   Primary Discharge Diagnoses:  NSTEMI CAD s/p CABG  Chronic HF w/ mid-range EF Atrial fibrillation w/ RVR NSCLC RUL Bioprosthetic AVR Type 2DM PAD  Hospital Course:  88 y.o. male with history of extensive vascular disease including CAD s/p CABG and redo CABG w/ AVR, AAA s/p endovascular repair in 2015, PAD, carotid stenosis s/p Rt CEA, chronic systolic/diastolic heart failure as well as difficult to control paroxsymal atrial fibrillation. Multiple cardioversion's. Previously failed amiodarone due to side effects (hallucinations). He has been on Tikosys but c/w breakthrough Afib. Most recently underwent TEE guided DCCV on 03/19/22.  TEE showed EF 50-55%, mildly decreased RV function, normal bioprosthetic aortic valve, mild MR and mild mitral stenosis (mean gradient 3 mmHg).  He saw EP and was offered ablation but decided against it. Recently diagnosed with stage IA2 NSCLC RUL. Saw Radiation Oncology and Pulmonary and decided on empiric treatment with SBRT.  9/8 admitted to ED w/ NSTEMI and a fib RVR. In ED, EKG confirmed Afib 79 bpm. Hs trop elevated 3,666>>3,866.   Underwent R/LHC 9/8: normal filling pressures. Known occlusion of native coronaries and SVG-RCA from original CABG. New occlusion of SVG-PDA from CABG#2. Long 99% stenosis in the SVG-ramus. Ramus is relatively small vessel. Patent LIMA-LAD with collaterals to RCA territory. Discussed with Dr. Ali Lowe, wouldn't benefit from intervention, medically managed.   Difficult to control A fib RVR. Pt refused amiodarone. Underwent successful TEE-DCCV today. TEE showed EF around 45% with normal RV. Follow-up EKG showed NSR.   Pt remained in NSR, brief hypotension post DCCV, felt to be secondary to sedation. Resolved, patient asymptomatic feeling well, felt appropriate to  discharge. Pt will continue to be followed closely in the HF clinic. Dr Aundra Dubin evaluated and deemed appropriate for discharge. F/u scheduled.   Detailed hospital problem list: 1.  NSTEMI:  Known CAD s/p CABG and Redo CABG. LHC 06/2014 showed significant coronary disease w/ no good interventional targets (LIMA-LAD patent with 40-50% stenosis in LAD after touchdown, sequential SVG-ramus/OM/diagonal with only the ramus branch still intact (known from prior cath), SVG-PDA from original surgery TO, SVG-PDA from redo surgery with long 50-60% mid-graft stenosis). NSTEMI this admission, repeat cath showed occlusion of the 2nd SVG-PDA and 99% stenosis in the SVG-ramus.  Reviewed with Dr. Ali Lowe, probably not much benefit to intervention on SVG-ramus.  Small territory covered and complex disease in the SVG.  Plan medical management. LDL excellent at 26.  - Stop ASA, INR supratherapeutic on warfarin 3.3. Titrated accordingly - continue Atorva 20 QOD + Zetia. Continue Repatha at d/c  2.  Chronic HF with mid-range EF: Echo this admission with stable EF 45-50%, LV global hypokinesis, RV systolic function normal. Filling pressures normal on RHC this admission, does not look volume overloaded on exam.  - ramipril 2.5 mg daily can restart tomorrow - Continue spironolactone 25mg  daily - Off Jardiance with E coli UTI.  - Toprol XL at 25 mg daily, start tomorrow - Start lasix 40 mg daily 3. Atrial fibrillation: Difficult to control, failed multiple cardioversions. Failed amiodarone due to CNS SE, feels strongly against trying again. Offered ablation by EP but he declined. Had been primarily in NSR on Tikosyn.  In setting of NSTEMI, he developed recurrent AF.  - on Tikosyn, will continue. QTc ok on this morning's ECG.  - INR 3.3  on warfarin, hep gtt stopped yesterday, stopping ASA today - Successful TEE-guided DCCV today, follow-up EKG shows NSR - Continue Toprol XL 25 mg daily 4. NSCLC RUL: Completed SBRT.  Followed  by radiation oncology. Followup CT in 8/23 reviewed with pulmonary, no acute change and will be followed as outpatient.  5. Bioprosthetic AVR: Stable on echo this admission.  6. Type 2DM: Recent A1c 6.9  - SSI  - off SLGT2i given UTIs  7. PAD:  AAA s/p EVR, LE PAD + Carotid Artery Disease s/p Rt CEA  - No current claudication symptoms.  - followed by Dr. Trula Slade  - stop ASA, lipid lowering agents per above   Discharge Weight Range: 83.3kg Discharge Vitals: Blood pressure (!) 98/50, pulse 61, temperature 97.8 F (36.6 C), temperature source Oral, resp. rate 18, height 5\' 10"  (1.778 m), weight 83.3 kg, SpO2 98 %.  Labs: Lab Results  Component Value Date   WBC 7.3 08/24/2022   HGB 12.2 (L) 08/24/2022   HCT 37.2 (L) 08/24/2022   MCV 92.3 08/24/2022   PLT 165 08/24/2022    Recent Labs  Lab 08/20/22 1000 08/21/22 0704 08/24/22 0351  NA 136   < > 135  K 4.8   < > 4.1  CL 101   < > 103  CO2 24   < > 25  BUN 23   < > 17  CREATININE 1.28*   < > 1.19  CALCIUM 9.3   < > 8.7*  PROT 6.9  --   --   BILITOT 1.1  --   --   ALKPHOS 46  --   --   ALT 18  --   --   AST 52*  --   --   GLUCOSE 235*   < > 180*   < > = values in this interval not displayed.   Lab Results  Component Value Date   CHOL 81 08/22/2022   HDL 36 (L) 08/22/2022   LDLCALC 26 08/22/2022   TRIG 97 08/22/2022   BNP (last 3 results) Recent Labs    03/06/22 1400 07/15/22 1206 08/20/22 1230  BNP 172.0* 164.3* 704.2*    ProBNP (last 3 results) No results for input(s): "PROBNP" in the last 8760 hours.   Diagnostic Studies/Procedures   Echo 9/7:  EF 45-50%, LV global hypokinesis, RV systolic function normal.   R/LHC 9/8:  normal filling pressures. Known occlusion of native coronaries and SVG-RCA from original CABG. New occlusion of SVG-PDA from CABG#2. Long 99% stenosis in the SVG-ramus. Ramus is relatively small vessel. Patent LIMA-LAD with collaterals to RCA territory. Discussed with Dr. Ali Lowe,  wouldn't benefit from intervention, medically managed.   TEE 9/11: Normal LV size and wall thickness.  EF 45%, diffuse hypokinesis.  Normal RV size and systolic function. Moderate left atrial enlargement, no LA appendage thrombus.  Mild right atrial enlargement.  No PFO or ASD by color doppler.  Trivial TR, peak RV-RA gradient 18 mmHg. Calcified mitral annulus with mild-moderate MR.  Bioprosthetic aortic valve with no stenosis or regurgitation.  Normal caliber thoracic aorta with grade 3 plaque. No LA appendage thrombus  DCCV 9/11: Cardioverted 1 time(s).  Cardioverted at [place]. Findings: Post procedure EKG shows: NSR Complications: None Patient did tolerate procedure well.  Discharge Medications   Allergies as of 08/24/2022       Reactions   Grass Extracts [gramineae Pollens] Other (See Comments)   Sneezing and runny nose   Milk-related Compounds Other (See Comments)   Just  Yogurt.  Makes him very lethargic.   Amiodarone Anxiety   depression        Medication List     STOP taking these medications    doxazosin 2 MG tablet Commonly known as: CARDURA       TAKE these medications    atorvastatin 20 MG tablet Commonly known as: LIPITOR Take 1 tablet (20 mg total) by mouth daily. Alternating with 30mg  What changed: when to take this   Co Q10 200 MG Caps Take 200 mg by mouth daily.   dofetilide 250 MCG capsule Commonly known as: TIKOSYN TAKE ONE CAPSULE TWICE A DAY   dorzolamide-timolol 22.3-6.8 MG/ML ophthalmic solution Commonly known as: COSOPT Place 1 drop into both eyes 2 (two) times daily.   ezetimibe 10 MG tablet Commonly known as: ZETIA Take 1 tablet (10 mg total) by mouth at bedtime.   furosemide 20 MG tablet Commonly known as: LASIX Take 2 tablets (40 mg total) by mouth daily. Please cancel all previous orders for current medication. Change in dosage or pill size. What changed: when to take this   Glucosamine Chondroitin Triple Tabs Take 1 tablet by  mouth daily.   latanoprost 0.005 % ophthalmic solution Commonly known as: XALATAN Place 1 drop into the left eye at bedtime.   metoprolol succinate 25 MG 24 hr tablet Commonly known as: TOPROL-XL Take 1 tablet (25 mg total) by mouth daily. Start taking on: August 25, 2022   ramipril 2.5 MG capsule Commonly known as: ALTACE Take 1 capsule (2.5 mg total) by mouth 2 (two) times daily. Start taking on: August 25, 5169   Repatha SureClick 017 MG/ML Soaj Generic drug: Evolocumab INJECT 1 PEN INTO SKIN EVERY 14 DAYS   spironolactone 25 MG tablet Commonly known as: ALDACTONE Take 1 tablet (25 mg total) by mouth daily.   tamsulosin 0.4 MG Caps capsule Commonly known as: FLOMAX Take 1 capsule (0.4 mg total) by mouth 2 (two) times daily.   warfarin 3 MG tablet Commonly known as: COUMADIN Take as directed. If you are unsure how to take this medication, talk to your nurse or doctor. Original instructions: Tomorrow (9/12) take 2 tablets (6mg ) Starting 9/13 take 3 tablets (9mg ) daily or as directed by [facility] What changed: additional instructions        Disposition   The patient will be discharged in stable condition to home. Discharge Instructions     (HEART FAILURE PATIENTS) Call MD:  Anytime you have any of the following symptoms: 1) 3 pound weight gain in 24 hours or 5 pounds in 1 week 2) shortness of breath, with or without a dry hacking cough 3) swelling in the hands, feet or stomach 4) if you have to sleep on extra pillows at night in order to breathe.   Complete by: As directed    Diet - low sodium heart healthy   Complete by: As directed    Heart Failure patients record your daily weight using the same scale at the same time of day   Complete by: As directed    Increase activity slowly   Complete by: As directed    STOP any activity that causes chest pain, shortness of breath, dizziness, sweating, or exessive weakness   Complete by: As directed         Follow-up Information     [facility] Follow up on 09/09/2022.   Specialty: Cardiology Why: [facility] 10:30 am Entrance C,  Free Valet Parking Please bring all medications to appointment Contact information: [address] [id number] [place] [phone]                  Duration of Discharge Encounter: Greater than 35 minutes   Signed, Earnie Larsson, AGACNP-BC  08/24/2022, 2:45 PM

## 2022-08-24 NOTE — Progress Notes (Signed)
Echocardiogram Echocardiogram Transesophageal has been performed.  Fidel Levy 08/24/2022, 9:01 AM

## 2022-08-24 NOTE — Progress Notes (Addendum)
[place] for Heparin and warfarin Indication: chest pain/ACS, afib  Allergies  Allergen Reactions   Grass Extracts [Gramineae Pollens] Other (See Comments)    Sneezing and runny nose   Milk-Related Compounds Other (See Comments)    Just Yogurt.  Makes him very lethargic.   Amiodarone Anxiety    depression    Patient Measurements: Height: 5\' 10"  (177.8 cm) Weight: 83.3 kg (183 lb 10.3 oz) IBW/kg (Calculated) : 73 Heparin Dosing Weight: 87 kg  Vital Signs: Temp: 98.1 F (36.7 C) (09/11 0429) Temp Source: Oral (09/11 0429) BP: 105/68 (09/11 0429) Pulse Rate: 100 (09/11 0429)  Labs: Recent Labs    08/21/22 0704 08/22/22 0254 08/23/22 0419 08/23/22 1301 08/24/22 0351  HGB 13.6 13.7 13.0  --  12.2*  HCT 40.8 41.1 38.7*  --  37.2*  PLT 162 137* 146*  --  165  LABPROT 18.9* 17.6* 22.5* 24.1* 32.9*  INR 1.6* 1.5* 2.0* 2.2* 3.3*  HEPARINUNFRC 0.38 0.44 0.39  --   --   CREATININE 1.24 1.12 1.17  --  1.19     Estimated Creatinine Clearance: 44.3 mL/min (by C-G formula based on SCr of 1.19 mg/dL).   Medical History: Past Medical History:  Diagnosis Date   AAA (abdominal aortic aneurysm) (HCC)    Atrial fibrillation (HCC)    Atrial fibrillation (HCC)    CAD (coronary artery disease)    Cataract    CHF (congestive heart failure) (HCC)    Chronic kidney disease    Colon polyps    adenomatous   Diabetes mellitus    Diverticulosis    Dysrhythmia    A-Fib   GERD (gastroesophageal reflux disease)    Gout    Hernia, abdominal    History of aortic valve replacement with bioprosthetic valve    2007   Hx: UTI (urinary tract infection)    Hyperlipemia    Hypertension    Internal hemorrhoids    Peripheral vascular disease (HCC)    Shortness of breath    with exertion    Assessment: 88 y.o. M presents with ACS and afib. Pt on warfarin PTA for afib (takes 9 mg daily). Admission INR 1.8. Pharmacy consulted to dose heparin gtt.  Patient is s/p heart cath on 9/8 showing occlusion of SVG. Team planning to manage medically and will restart warfarin. Plan for TEE guided DCCV today.  INR is supratherapeutic at 3.3 with large rise in the last 24 hours. Will reduce today's dose. No issues with infusion reported, CBC stable. No signs or symptoms of bleeding noted. Pt may be going home today after DCCV. Given sharp increase in INR with high risk of clot after DCCV, will attempt to balance risks. Recommendations for discharge are below.  Goal of Therapy:  Heparin level 0.3-0.7 units/ml INR goal 2-3 Monitor platelets by anticoagulation protocol: Yes   Plan:  Give warfarin 1.5 mg x1 dose today, then 6 mg x1 dose on 9/12, then resume PTA regimen Monitor daily heparin level, INR, and CBC Monitor for s/sx of bleeding  Thank you for allowing pharmacy to participate in this patient's care.  Reatha Harps, PharmD PGY2 Pharmacy Resident 08/24/2022 6:43 AM Check [email or web address] for unit specific pharmacy number

## 2022-08-24 NOTE — Progress Notes (Addendum)
Patient ID: Nathaniel [name]., male   DOB: 1932/12/14, 88 y.o.   MRN: [id number]     Advanced Heart Failure Rounding Note  PCP-Cardiologist: None   Subjective:    Presented with worsening CP and exertional dyspnea for ~3 days, found to have NSTEMI and to be in atrial fibrillation with RVR.   LHC/RHC 9/8:  1. Normal filling pressures.  2. Known occlusion of native coronaries and SVG-RCA from original CABG.  3. New occlusion of SVG-PDA from CABG#2.  4. Long 99% stenosis in the SVG-ramus (was sequential SVG-ramus/OM/D but OM and D branches noted to be lost on prior cath).  Ramus is relatively small vessel. Medical management.  5. Patent LIMA-LAD with collaterals to RCA territory.   HR 70s-90s this morning, still in atrial fibrillation. Feels better. SBP around 100. Denies CP and SOB.   Objective:   Weight Range: 83.3 kg Body mass index is 26.35 kg/m.   Vital Signs:   Temp:  [97.8 F (36.6 C)-98.1 F (36.7 C)] 98.1 F (36.7 C) (09/11 0429) Pulse Rate:  [58-105] 100 (09/11 0429) Resp:  [18-19] 18 (09/11 0429) BP: (100-114)/(64-83) 105/68 (09/11 0429) SpO2:  [96 %-100 %] 97 % (09/11 0429) Weight:  [83.3 kg] 83.3 kg (09/11 0429) Last BM Date : 08/22/22  Weight change: Filed Weights   08/22/22 0514 08/23/22 0514 08/24/22 0429  Weight: 83.6 kg 83.3 kg 83.3 kg    Intake/Output:   Intake/Output Summary (Last 24 hours) at 08/24/2022 0705 Last data filed at 08/23/2022 2000 Gross per 24 hour  Intake 342.52 ml  Output --  Net 342.52 ml     Physical Exam  General:  elderly appearing. No respiratory difficulty HEENT: normal Neck: supple. No JVD. Carotids 2+ bilat; no bruits. No lymphadenopathy or thyromegaly appreciated. Cor: PMI nondisplaced. Irregular rate & rhythm. No rubs, gallops or murmurs. Lungs: clear Abdomen: soft, nontender, nondistended. No hepatosplenomegaly. No bruits or masses. Good bowel sounds. Extremities: no cyanosis, clubbing, rash, edema  Neuro: alert &  oriented x 3, cranial nerves grossly intact. moves all 4 extremities w/o difficulty. Affect pleasant.   Telemetry   A fib 70s-90s (personally reviewed)  Labs    CBC Recent Labs    08/23/22 0419 08/24/22 0351  WBC 7.8 7.3  HGB 13.0 12.2*  HCT 38.7* 37.2*  MCV 91.1 92.3  PLT 146* 846   Basic Metabolic Panel Recent Labs    08/23/22 0419 08/24/22 0351  NA 136 135  K 4.2 4.1  CL 101 103  CO2 26 25  GLUCOSE 150* 180*  BUN 17 17  CREATININE 1.17 1.19  CALCIUM 8.9 8.7*  MG 1.9 2.1   Liver Function Tests No results for input(s): "AST", "ALT", "ALKPHOS", "BILITOT", "PROT", "ALBUMIN" in the last 72 hours.  No results for input(s): "LIPASE", "AMYLASE" in the last 72 hours. Cardiac Enzymes No results for input(s): "CKTOTAL", "CKMB", "CKMBINDEX", "TROPONINI" in the last 72 hours.  BNP: BNP (last 3 results) Recent Labs    03/06/22 1400 07/15/22 1206 08/20/22 1230  BNP 172.0* 164.3* 704.2*    ProBNP (last 3 results) No results for input(s): "PROBNP" in the last 8760 hours.   D-Dimer No results for input(s): "DDIMER" in the last 72 hours. Hemoglobin A1C No results for input(s): "HGBA1C" in the last 72 hours. Fasting Lipid Panel Recent Labs    08/22/22 0254  CHOL 81  HDL 36*  LDLCALC 26  TRIG 97  CHOLHDL 2.3   Thyroid Function Tests No results for input(s): "  TSH", "T4TOTAL", "T3FREE", "THYROIDAB" in the last 72 hours.  Invalid input(s): "FREET3"  Other results:   Imaging    No results found.   Medications:     Scheduled Medications:  aspirin EC  81 mg Oral Daily   atorvastatin  20 mg Oral Daily   dofetilide  250 mcg Oral BID   dorzolamide-timolol  1 drop Both Eyes BID   ezetimibe  10 mg Oral QHS   latanoprost  1 drop Left Eye QHS   metoprolol succinate  25 mg Oral BID   sodium chloride flush  3 mL Intravenous Q12H   sodium chloride flush  3 mL Intravenous Q12H   sodium chloride flush  3 mL Intravenous Q12H   tamsulosin  0.4 mg Oral BID    warfarin  5 mg Oral ONCE-1600   Warfarin - Pharmacist Dosing Inpatient   Does not apply q1600    Infusions:  sodium chloride 20 mL/hr (08/24/22 0528)   sodium chloride     sodium chloride      PRN Medications: sodium chloride, sodium chloride, acetaminophen, ondansetron (ZOFRAN) IV, sodium chloride flush, sodium chloride flush    Patient Profile  88 y.o. male with history of extensive vascular disease including CAD s/p CABG and redo CABG w/ AVR, AAA s/p endovascular repair in 2015, PAD, carotid stenosis s/p Rt CEA, chronic systolic and diastolic heart failure as well as difficult to control paroxsymal atrial fibrillation, recently diagnosed with stage IA2 NSCLC RUL treated w/ SBRT, presenting w/ unstable angina, ruled in for NSTEMI. Back in Afib w/ CVR.   Assessment/Plan   1.  NSTEMI:  Known CAD s/p CABG and Redo CABG. LHC 06/2014 showed significant coronary disease w/ no good interventional targets (LIMA-LAD patent with 40-50% stenosis in LAD after touchdown, sequential SVG-ramus/OM/diagonal with only the ramus branch still intact (known from prior cath), SVG-PDA from original surgery TO, SVG-PDA from redo surgery with long 50-60% mid-graft stenosis). NSTEMI this admission, repeat cath showed occlusion of the 2nd SVG-PDA and 99% stenosis in the SVG-ramus.  Reviewed with Dr. Ali Lowe, probably not much benefit to intervention on SVG-ramus.  Small territory covered and complex disease in the SVG.  Plan medical management. LDL excellent at 26.  - Stop ASA, INR supratherapeutic on warfarin 3.3 - continue Atorva 20 QOD + Zetia. Continue Repatha at d/c  2.  Chronic HF with mid-range EF: Echo this admission with stable EF 45-50%, LV global hypokinesis, RV systolic function normal. Filling pressures normal on RHC this admission, does not look volume overloaded on exam.  - Hold ramipril 2.5 mg bid and spironolactone with low BP.  - Off Jardiance with E coli UTI.  - Continue Toprol XL at 25 mg  BID 3. Atrial fibrillation: Difficult to control, failed multiple cardioversions.  Failed amiodarone due to CNS SE, feels strongly against trying again. Offered ablation by EP but he declined. Had been primarily in NSR on Tikosyn.  In setting of NSTEMI, he developed recurrent AF.  Remains in AF this morning.  - on Tikosyn, will continue. QTc ok on this morning's ECG.  - INR 3.3 on warfarin, hep gtt stopped yesterday, stopping ASA today - TEE-guided DCCV today  - Continue Toprol XL 25 mg bid.  4. NSCLC RUL: Completed SBRT.  Followed by radiation oncology. Followup CT in 8/23 reviewed with pulmonary, no acute change and will be followed as outpatient.  5. Bioprosthetic AVR: Stable on echo this admission.  6. Type 2DM: Recent A1c 6.9  - SSI  -  off SLGT2i given UTIs  7. PAD:  AAA s/p EVR, LE PAD + Carotid Artery Disease s/p Rt CEA  - No current claudication symptoms.  - followed by Dr. Trula Slade  - stop ASA, lipid lowering agents per above   Earnie Larsson, AGACNP-BC  08/24/2022  Advanced Heart Failure Team Pager [phone] (M-F; 7a - 5p)  Please contact [facility] for night-coverage after hours (4p -7a ) and weekends on [email or web address]  Patient seen with NP, agree with the above note.   TEE-DCCV done today, patient is back in NSR.  TEE showed EF around 45% with normal RV, mild-moderate MR, normal bioprosthetic aortic valve, no LA appendage thrombus.   No complaints this morning.   General: NAD Neck: No JVD, no thyromegaly or thyroid nodule.  Lungs: Clear to auscultation bilaterally with normal respiratory effort. CV: Nondisplaced PMI.  Heart irregular S1/S2, no S3/S4, no murmur.  No peripheral edema.  .  Abdomen: Soft, nontender, no hepatosplenomegaly, no distention.  Skin: Intact without lesions or rashes.  Neurologic: Alert and oriented x 3.  Psych: Normal affect. Extremities: No clubbing or cyanosis.  HEENT: Normal.   Patient now back in NSR.  Watch this morning, if he remains in NSR  and stable, can go home later today. Needs close followup in CHF clinic.   Meds for discharge: Tikosyn 250 mcg bid, warfarin per coumadin clinic, Repatha, atorvastatin 20 daily, Zetia 10 daily, spironolactone 25 daily, Lasix 40 daily, ramipril 2.5 bid (start tomorrow), Toprol XL 25 daily (start tomorrow).  Would not take aspirin with therapeutic warfarin.   [name] 08/24/2022 8:58 AM .

## 2022-08-24 NOTE — Progress Notes (Signed)
Mobility Specialist - Progress Note   08/24/22 1138  Mobility  Activity Ambulated with assistance in hallway  Level of Assistance Contact guard assist, steadying assist  Assistive Device Front wheel walker  Distance Ambulated (ft) 360 ft  Activity Response Tolerated well  $Mobility charge 1 Mobility    Pre-mobility:53 HR,97/57 BP, 96% SpO2 During mobility: 78 HR Post-mobility: 61 HR,93/58  BP,98% SpO2  Pt was received in bed and agreeable to mobility. No c/o pain throughout ambulation. Pt was returned to bed with all needs met.   Larey Seat

## 2022-08-24 NOTE — CV Procedure (Signed)
Procedure: TEE  Sedation: Per anesthesiology  Indication: Atrial fibrillation.   Findings: Please see echo section for full report.  Normal LV size and wall thickness.  EF 45%, diffuse hypokinesis.  Normal RV size and systolic function. Moderate left atrial enlargement, no LA appendage thrombus.  Mild right atrial enlargement.  No PFO or ASD by color doppler.  Trivial TR, peak RV-RA gradient 18 mmHg. Calcified mitral annulus with mild-moderate MR.  Bioprosthetic aortic valve with no stenosis or regurgitation.  Normal caliber thoracic aorta with grade 3 plaque.   No LA appendage thrombus, may cardiovert.   Nathaniel [name]. 08/24/2022 8:45 AM

## 2022-08-24 NOTE — Interval H&P Note (Signed)
History and Physical Interval Note:  08/24/2022 8:29 AM  Nathaniel [name].  has presented today for surgery, with the diagnosis of atrial fibrillation.  The various methods of treatment have been discussed with the patient and family. After consideration of risks, benefits and other options for treatment, the patient has consented to  Procedure(s): CARDIOVERSION (N/A) TRANSESOPHAGEAL ECHOCARDIOGRAM (TEE) (N/A) as a surgical intervention.  The patient's history has been reviewed, patient examined, no change in status, stable for surgery.  I have reviewed the patient's chart and labs.  Questions were answered to the patient's satisfaction.     Nathaniel [name]

## 2022-08-25 ENCOUNTER — Telehealth: Payer: Self-pay | Admitting: *Deleted

## 2022-08-25 LAB — POCT I-STAT EG7
Acid-Base Excess: 4 mmol/L — ABNORMAL HIGH (ref 0.0–2.0)
Bicarbonate: 29.9 mmol/L — ABNORMAL HIGH (ref 20.0–28.0)
Calcium, Ion: 1.23 mmol/L (ref 1.15–1.40)
HCT: 38 % — ABNORMAL LOW (ref 39.0–52.0)
Hemoglobin: 12.9 g/dL — ABNORMAL LOW (ref 13.0–17.0)
O2 Saturation: 62 %
Potassium: 4.6 mmol/L (ref 3.5–5.1)
Sodium: 136 mmol/L (ref 135–145)
TCO2: 31 mmol/L (ref 22–32)
pCO2, Ven: 49.3 mmHg (ref 44–60)
pH, Ven: 7.391 (ref 7.25–7.43)
pO2, Ven: 33 mmHg (ref 32–45)

## 2022-08-25 NOTE — Telephone Encounter (Signed)
Transition Care Management Unsuccessful Follow-up Telephone Call  Date of discharge and from where:  08/24/22  Attempts:  1st Attempt  Reason for unsuccessful TCM follow-up call:  Left voice message

## 2022-08-28 ENCOUNTER — Encounter (HOSPITAL_COMMUNITY): Payer: Self-pay

## 2022-09-09 ENCOUNTER — Ambulatory Visit (INDEPENDENT_AMBULATORY_CARE_PROVIDER_SITE_OTHER): Payer: Self-pay

## 2022-09-09 ENCOUNTER — Encounter (HOSPITAL_COMMUNITY): Payer: Self-pay

## 2022-09-09 ENCOUNTER — Ambulatory Visit
Admission: RE | Admit: 2022-09-09 | Discharge: 2022-09-09 | Disposition: A | Discharge status: Home / Self Care | Payer: Self-pay | Source: Ambulatory Visit | Attending: Internal Medicine | Admitting: Internal Medicine

## 2022-09-09 VITALS — BP 106/70 | HR 113 | Wt 189.6 lb

## 2022-09-09 DIAGNOSIS — Z5181 Encounter for therapeutic drug level monitoring: Secondary | ICD-10-CM

## 2022-09-09 DIAGNOSIS — C3490 Malignant neoplasm of unspecified part of unspecified bronchus or lung: Secondary | ICD-10-CM | POA: Insufficient documentation

## 2022-09-09 DIAGNOSIS — Z8774 Personal history of (corrected) congenital malformations of heart and circulatory system: Secondary | ICD-10-CM | POA: Insufficient documentation

## 2022-09-09 DIAGNOSIS — I4819 Other persistent atrial fibrillation: Secondary | ICD-10-CM

## 2022-09-09 DIAGNOSIS — Z923 Personal history of irradiation: Secondary | ICD-10-CM | POA: Insufficient documentation

## 2022-09-09 DIAGNOSIS — I5042 Chronic combined systolic (congestive) and diastolic (congestive) heart failure: Secondary | ICD-10-CM

## 2022-09-09 DIAGNOSIS — I739 Peripheral vascular disease, unspecified: Secondary | ICD-10-CM

## 2022-09-09 DIAGNOSIS — C3491 Malignant neoplasm of unspecified part of right bronchus or lung: Secondary | ICD-10-CM

## 2022-09-09 DIAGNOSIS — I252 Old myocardial infarction: Secondary | ICD-10-CM | POA: Insufficient documentation

## 2022-09-09 DIAGNOSIS — I110 Hypertensive heart disease with heart failure: Secondary | ICD-10-CM | POA: Insufficient documentation

## 2022-09-09 DIAGNOSIS — Z953 Presence of xenogenic heart valve: Secondary | ICD-10-CM | POA: Insufficient documentation

## 2022-09-09 DIAGNOSIS — Z7901 Long term (current) use of anticoagulants: Secondary | ICD-10-CM | POA: Insufficient documentation

## 2022-09-09 DIAGNOSIS — Z79899 Other long term (current) drug therapy: Secondary | ICD-10-CM | POA: Insufficient documentation

## 2022-09-09 DIAGNOSIS — I25810 Atherosclerosis of coronary artery bypass graft(s) without angina pectoris: Secondary | ICD-10-CM | POA: Insufficient documentation

## 2022-09-09 DIAGNOSIS — I2510 Atherosclerotic heart disease of native coronary artery without angina pectoris: Secondary | ICD-10-CM

## 2022-09-09 DIAGNOSIS — I714 Abdominal aortic aneurysm, without rupture, unspecified: Secondary | ICD-10-CM | POA: Insufficient documentation

## 2022-09-09 DIAGNOSIS — Z952 Presence of prosthetic heart valve: Secondary | ICD-10-CM

## 2022-09-09 DIAGNOSIS — Z87440 Personal history of urinary (tract) infections: Secondary | ICD-10-CM | POA: Insufficient documentation

## 2022-09-09 DIAGNOSIS — I4891 Unspecified atrial fibrillation: Secondary | ICD-10-CM | POA: Insufficient documentation

## 2022-09-09 DIAGNOSIS — E119 Type 2 diabetes mellitus without complications: Secondary | ICD-10-CM

## 2022-09-09 LAB — CBC
HCT: 39.4 % (ref 39.0–52.0)
Hemoglobin: 12.7 g/dL — ABNORMAL LOW (ref 13.0–17.0)
MCH: 30 pg (ref 26.0–34.0)
MCHC: 32.2 g/dL (ref 30.0–36.0)
MCV: 93.1 fL (ref 80.0–100.0)
Platelets: 202 10*3/uL (ref 150–400)
RBC: 4.23 MIL/uL (ref 4.22–5.81)
RDW: 12.5 % (ref 11.5–15.5)
WBC: 6.9 10*3/uL (ref 4.0–10.5)
nRBC: 0 % (ref 0.0–0.2)

## 2022-09-09 LAB — BASIC METABOLIC PANEL
Anion gap: 5 (ref 5–15)
BUN: 19 mg/dL (ref 8–23)
CO2: 29 mmol/L (ref 22–32)
Calcium: 9.3 mg/dL (ref 8.9–10.3)
Chloride: 103 mmol/L (ref 98–111)
Creatinine, Ser: 1.13 mg/dL (ref 0.61–1.24)
GFR, Estimated: >60 mL/min (ref >60)
Glucose, Bld: 165 mg/dL — ABNORMAL HIGH (ref 70–99)
Potassium: 4.8 mmol/L (ref 3.5–5.1)
Sodium: 137 mmol/L (ref 135–145)

## 2022-09-09 LAB — PROTIME-INR
INR: 1.9 — ABNORMAL HIGH (ref 0.8–1.2)
Prothrombin Time: 21.2 seconds — ABNORMAL HIGH (ref 11.4–15.2)

## 2022-09-09 LAB — POCT INR: INR: 1.8 — AB (ref 2.0–3.0)

## 2022-09-09 LAB — BRAIN NATRIURETIC PEPTIDE: B Natriuretic Peptide: 696.6 pg/mL — ABNORMAL HIGH (ref 0.0–100.0)

## 2022-09-09 MED ORDER — METOPROLOL SUCCINATE ER 25 MG PO TB24: 25.0000 mg | ORAL_TABLET | Freq: Two times a day (BID) | ORAL | 3 refills | Status: DC

## 2022-09-09 NOTE — Patient Instructions (Addendum)
Description   Take 3.5 tablets today and then continue taking Warfarin 3 tablets daily. Stay consistent with greens each week. Recheck INR in 3 weeks  [facility] [phone].

## 2022-09-09 NOTE — Addendum Note (Signed)
Encounter addended by: Rafael Bihari, FNP on: 09/09/2022 1:54 PM  Actions taken: Clinical Note Signed

## 2022-09-09 NOTE — Progress Notes (Addendum)
Patient ID: Nathaniel Botts., male   DOB: Dec 19, 1931, 88 y.o.   MRN: [id number] PCP: Dr. Elyse [name] Cardiology: Dr. Aundra [name]  88 y.o. with history of extensive vascular disease including CAD s/p CABG and redo CABG, AAA s/p endovascular repair in 2015, PAD, and carotid stenosis as well as prior AVR.                            After his AAA repair, he was noted to be in atrial fibrillation.  He had had a prior episode of atrial fibrillation after his cardiac surgery in 2007.  Atrial fibrillation was rate-controlled.  He was started on warfarin and Toprol XL.  He developed exertional dyspnea and fatigue and was cardioverted back to SR 06/06/14.  Echo 06/15 EF worsened to 25-30% with stable bioprosthetic aortic valve, mildly dilated RV with normal systolic function.  N[name] 07/15 showed stable anatomy.  The LIMA-LAD was patent, the sequential SVG-OM/ramus/D was patent only to ramus but this is the same as the prior cath, and the SVG-PDA was patent with a long 50-60% mid-graft stenosis.  No intervention.  Of note, at cath he was noted to be back in atrial fibrillation.  He was started on amiodarone and had DCCV again on 08/21/14.  This was successful.  He has been off amiodarone due to side effects (hallucinations).  Echo in 1/16 showed recovery of EF to 55-60%.    He was admitted in 1/19 with bronchitis/wheezing/dyspnea and was found to be in atrial fibrillation with RVR.  He tested positive for metapneumovirus.  Echo showed EF 45-50% with normal  bioprosthetic aortic valve. He was diuresed for volume overload while in the hospital. He had DCCV to NSR.    Carotid dopplers in 1/19 showed 80-99% RICA stenosis, he had right CEA in 4/31 with no complications.   At appointment in early 6/20, [name] was noted to be back in atrial fibrillation with RVR and mild CHF.  He was started on Toprol XL and Lasix was increased.  He had TEE-guided DCCV to NSR in 6/20.  TEE showed EF 50%, mild diffuse hypokinesis, mildly decreased RV systolic function, normally functioning bioprosthetic aortic valve.   In 2021, he was admitted for Tikosyn initiation.  In 12/21, he went into atrial fibrillation again and was cardioverted back to NSR.   Echo in 1/22 showed EF 45-50%, diffuse hypokinesis, mildly decreased RV systolic function, moderate biatrial enlargement, bioprosthetic valve with mean gradient 8 mmHg.    In 2/22, he went into atrial fibrillation and had TEE-guided DCCV back to NSR.  TEE showed EF 45-50%.    In 3/22, he was back in the ER with atrial fibrillation triggered by [name] most likely.  He had a cardioversion back to NSR.   In 10/22, patient developed urinary retention and an E coli UTI.  In the setting of this, he had a fall at home and was unable to get up due to weakness for hours.  He developed rhabdomyolysis.  This resolved with minimal renal damage with IV fluid hydration and he was discharged to rehab.   Follow up 2/23 he was in atrial fibrillation, TEE/DCCV arranged however INR subtherapeutic. He cancelled rescheduled procedure due to a death in the family.  Follow up 3/23. Remained in AF. Volume overloaded. Lasix increased for 3 days. Underwent TEE guided DCCV on 03/19/22.  TEE showed EF 50-55%, mildly decreased RV function, normal bioprosthetic aortic valve, mild Nathaniel and mild  mitral stenosis (mean gradient 3 mmHg).    Recently diagnosed with stage IA2 NSCLC RUL. Saw Radiation Oncology and Pulmonary and decided on empiric treatment  with SBRT.  He has completed radiation.   Admitted 9/23 with NSTEMI and AF with RVR. Underwent R/LHC showing normal filling pressures, known occlusion of native coronaries and SVG-RCA from original CABG. New occlusion of SVG-PDA from CABG#2. Long 99% stenosis in the SVG-ramus. Ramus is relatively small vessel. Patent LIMA-LAD with collaterals to RCA territory. Case was discussed with [name], wouldn't benefit from intervention, medically managed. Patient declined re-challenge of amiodarone and was able to have successful TEE/DCCV to NSR. TEE showed EF 45% with normal RV, mild-moderate Nathaniel, normal bioprosthetic aortic valve, no LA appendage thrombus. Discharged home, weight 184 lbs.  Today he returns for post hospitalization HF follow up. Overall feeling poorly. He is SOB after walking 25-30 yards but not specifically fatigued. [name]. Denies CP, dizziness, edema, or PND/Orthopnea. Appetite ok. No fever or chills. Weight at home 186 pounds. Taking all medications. Enjoys going to the driving range and stock trading.  ECG (personally reviewed): atrial fibrillation, 116 bpm  Labs (5/15): K 4.3, creatinine 0.89 Labs (6/15): K 4.3, creatinine 1.4, BNP 112 Labs (7/15): K 4.4, creatinine 1.2, HCT 40.2 Labs (8/15): K 4, creatinine 1.2, BNP 113, LFTs and TSH normal Labs (10/15): K 4.4, creatinine 1.4 Labs (5/16): K 4.7, creatinine 1.18, HDL 52, LDL 98 Labs (6/16): LDL 118, LFTs normal Labs (1/17): HCT 40.2, K 3.7, creatinine 1.04 Labs (1/19): K 4.9, creatinine 1.19 Labs (2/19): K 4.2, creatinine 0.89 Labs (3/19): LDL 86, HDL 45, TGs 245 Labs (5/19): LDL 110, HDL 39 Labs (6/19): K 4.1, creatinine 1.07 Labs (11/19): LDL 14, HDL 56 Labs (6/20): K 4.1, creatinine 1.0 Labs (7/20): K 4.3, creatinine 1.07 Labs (9/21): LDL 129, hgb 14.8, K 4.1, creatinine 1.02 Labs (12/21): LDL 20, HDL 46, K 4.4, creatinine 1.23 Labs (1/22): LDL 45, HDL 49, K 4.3, creatinine 1.1, BNP  184 Labs (3/22): K 4.4, creatinine 1.10, LDL 15, HDL 49 Labs (6/22): K 4.3, creatinine 1.11, LDL 121 Labs (7/22): K 4.3, creatinine 1.23, hgb 14.5 Labs (11/22): K 4.5, creatinine 1.14 Labs (2/23): K 4.3, creatinine 1.16 Labs (7/23): K 4.4, creatinine 1.24 Labs (9/23): K 4.1, creatinine 1.19  PMH: 1. PAD: Peripheral arterial dopplers in 2012 showed > 75% bilateral SFA stenosis. Peripheral arterial dopplers in 10/14 showed > 50% focal bilateral SFA stenoses.  2. AAA: [place] 4/15 with 4.4 cm AAA but concern for penetrating ulcers. CTA abdomen showed 4.4 cm AAA with penetrating ulcers and possible pseudoaneurysms. Now s/p endovascular repair of AAA in 9/93 with no complications.  3. Carotid stenosis: Carotid dopplers (4/15) with 60-79% bilateral ICA stenosis. Carotid dopplers (11/15) with 60-79% bilateral ICA stenosis.  Carotid dopplers 6/16 with 60-79% bilateral ICA stenosis. - Carotid dopplers (1/18): 60-79% RICA stenosis.  - Carotid dopplers (1/19): 80-99% RICA stenosis => Right CEA in 2/19.  - Carotid dopplers (11/19): 40-59% bilateral stenosis.  - Carotid dopplers (1/21): 1-39% BICA stenosis.  4. CAD: CABG 1989 with LIMA-LAD, SVG-D, seq SVG-ramus and OM, sequential SVG-PDA/PLV. SVG-PDA/PLV found to be occluded on cath prior to AVR, so patient had SVG-PDA with AVR in 2007. LHC (7/15) with LIMA-LAD patent with 40-50% stenosis in LAD after touchdown, sequential SVG-ramus/OM/diagonal with only the ramus branch still intact (known from prior cath), SVG-PDA from original surgery TO, SVG-PDA from redo surgery with long 50-60% mid-graft stenosis.  No target  for intervention.  - LHC (9/23): known occlusion of native coronaries and SVG-RCA from original CABG, new occlusion of SVG-PDA from CABG#2, long 99% stenosis in the SVG-ramus (was sequential SVG-ramus/OM/D but OM and D branches noted to be lost on prior cath), ramus is relatively small vessel, patent LIMA-LAD with collaterals to RCA territory. Case  reviewed with [name], probably not much benefit to intervention on SVG-ramus. Small territory covered and complex disease in SVG. Medical management advised. 5. Severe aortic stenosis: Bioprosthetic AVR in 2007.  6. Atrial fibrillation: Paroxysmal. Initially noted after cardiac surgery, then again after AAA repair.  DCCV to NSR 06/06/14.  DCCV to NSR again 08/21/14.  - DCCV to NSR in 1/19.  - DCCV to NSR in 6/20.  - Now on Tikosyn.  - DCCV to NSR 12/21.  - DCCV to NSR 2/22. - DCCV to NSR 3/22. - DCCV to NSR 4/23. - DCCV to NSR 9/23. 7. Type II diabetes  8. Gout  9. GERD  10. Hyperlipidemia  11. HTN 12. GERD  13. Chronic systolic CHF: Echo (7/56) with EF worsened to 25-30% with grade II diastolic dysfunction, stable bioprosthetic aortic valve, mildly dilated RV with normal systolic function.  Echo (1/16) with EF 55-60%, basal inferior hypokinesis, mild LVH, bioprosthetic aortic valve with mean gradient 13 mmHg, mild Nathaniel, severe LAE.  - Echo (1/19): EF 45-50%, mild Nathaniel, normal bioprosthetic aortic valve.  - TEE (6/20): EF 50% with mild diffuse hypokinesis, mildly decreased RV systolic function with mild RV enlargement, bioprosthetic aortic valve looked ok, mild Nathaniel. - Echo (1/22):  EF 45-50%, diffuse hypokinesis, mildly decreased RV systolic function, moderate biatrial enlargement, bioprosthetic valve with mean gradient 8 mmHg.   - TEE (2/22): EF 45-50%, RV normal, moderate biatrial enlargement, bioprosthetic AoV with mean gradient 6 mmHg, mild Nathaniel.  - TEE (4/23): EF 50-55%, mildly decreased RV function, normal bioprosthetic aortic valve, mild Nathaniel and mild mitral stenosis (mean gradient 3 mmHg) - TEE (9/23): EF 45%, RV normal, no LAA thrombus, no PFO/ASD, mild to moderate Nathaniel w/ moderate MAC, AoV without stenosis/regurgitation 14. BPH  15. Nonsmall cell lung cancer: Suspected by imaging, treated in 2023 with radiation.  70. Fall with rhabdomyolysis (10/22)   SH: Widower, 2 children, raised his  3 grand-daughters, lives in [place], [place].  Occasional ETOH, no smoking.   FH: Father died from ruptured AAA.   ROS: All systems reviewed and negative except as per HPI.   Current Outpatient Medications  Medication Sig Dispense Refill   atorvastatin (LIPITOR) 20 MG tablet Take 1 tablet (20 mg total) by mouth daily. Alternating with 56m 30 tablet 5   Coenzyme Q10 (CO Q10) 200 MG CAPS Take 200 mg by mouth daily.     dofetilide (TIKOSYN) 250 MCG capsule TAKE ONE CAPSULE TWICE A DAY 60 capsule 11   dorzolamide-timolol (COSOPT) 22.3-6.8 MG/ML ophthalmic solution Place 1 drop into both eyes 2 (two) times daily.     ezetimibe (ZETIA) 10 MG tablet Take 1 tablet (10 mg total) by mouth at bedtime. 90 tablet 3   furosemide (LASIX) 20 MG tablet Patient takes 2 tablets by mouth in the morning and 1 tablet at night.     latanoprost (XALATAN) 0.005 % ophthalmic solution Place 1 drop into the left eye at bedtime.     metoprolol succinate (TOPROL-XL) 25 MG 24 hr tablet Take 1 tablet (25 mg total) by mouth daily. 90 tablet 3   Misc Natural Products (GLUCOSAMINE CHONDROITIN TRIPLE) TABS Take 1 tablet  by mouth daily.     ramipril (ALTACE) 2.5 MG capsule Take 1 capsule (2.5 mg total) by mouth daily. 90 capsule 3   REPATHA SURECLICK 140 MG/ML SOAJ INJECT 1 PEN INTO SKIN EVERY 14 DAYS 2 mL 11   spironolactone (ALDACTONE) 25 MG tablet Take 1 tablet (25 mg total) by mouth daily. 30 tablet 5   tamsulosin (FLOMAX) 0.4 MG CAPS capsule Take 1 capsule (0.4 mg total) by mouth 2 (two) times daily. 180 capsule 3   warfarin (COUMADIN) 3 MG tablet Tomorrow (9/12) take 2 tablets (6mg) Starting 9/13 take 3 tablets (9mg) daily or as directed by [facility] 90 tablet 2   No current facility-administered medications for this encounter.   BP 106/70   Pulse (!) 113   Wt 86 kg (189 lb 9.6 oz)   SpO2 97%   BMI 27.20 kg/m    Wt Readings from Last 3 Encounters:  09/09/22 86 kg (189 lb 9.6 oz)  08/24/22 83.3 kg  (183 lb 10.3 oz)  07/15/22 87.6 kg (193 lb 3.2 oz)   Physical Exam: General:  NAD. No resp difficulty, elderly, walked into clinic. HEENT: Normal Neck: Supple. No JVD. Carotids 2+ bilat; no bruits. No lymphadenopathy or thryomegaly appreciated. Cor: PMI nondisplaced. Irregular rate & rhythm. No rubs, gallops or murmurs. Lungs: Clear Abdomen: Soft, nontender, nondistended. No hepatosplenomegaly. No bruits or masses. Good bowel sounds. Extremities: No cyanosis, clubbing, rash, edema Neuro: Alert & oriented x 3, cranial nerves grossly intact. Moves all 4 extremities w/o difficulty. Affect pleasant.  Assessment/Plan:  1.  CAD: s/p CABG and Redo CABG. LHC 06/2014 showed significant coronary disease w/ no good interventional targets (LIMA-LAD patent with 40-50% stenosis in LAD after touchdown, sequential SVG-ramus/OM/diagonal with only the ramus branch still intact (known from prior cath), SVG-PDA from original surgery TO, SVG-PDA from redo surgery with long 50-60% mid-graft stenosis). NSTEMI this admission (9/23), repeat LHC showed occlusion of the 2nd SVG-PDA and 99% stenosis in the SVG-ramus.  Reviewed with Dr. Thukkani, probably not much benefit to intervention on SVG-ramus.  Small territory covered and complex disease in the SVG.  Plan medical management.  - Now off ASA. - Continue atorvastatin 20 mg QOD + Zetia.  - Continue Repatha. LDL excellent at 26.  2.  Chronic HF with mid-range EF: Echo this admission with stable EF 45-50%, LV global hypokinesis, RV systolic function normal. Filling pressures normal on RHC this admission (9/23). NYHA III-IIIb, he is not volume overloaded today. Suspect worsening symptoms due to return of AF (see below). - Increase Toprol XL to 25 mg bid. - Continue Lasix 40 mg q am/20 mg qpm. BMET and BNP today. - Continue ramipril 2.5 mg daily. - Continue spironolactone 25 mg daily. - Off Jardiance with recent E coli UTI.  - Continue Toprol XL 25 mg daily. 3. Atrial  fibrillation: Difficult to control, failed multiple cardioversions.  Failed amiodarone due to CNS SE, feels strongly against trying again. Offered ablation by EP but he declined. Had been primarily in NSR on Tikosyn.  In setting of NSTEMI, he developed recurrent AF. S/p TEE/DCCV with successful conversion to NSR. Unfortunately he is in atrial fibrillation today, HR 116 bpm. We discussed re-challenge with amiodarone, he strongly declined. Discussed repeating DCCV and referring to EP/AF clinic to discuss options. Age may be prohibitive for ablation candidacy. He is agreeable for re-attempt at cardioversion. Check INR, if subtherapeutic, will need a TEE prior to cardioverson. Discussed with Dr. McLean, will arrange for DCCV (+/-   TEE) soon. - Continue Tikosyn 250 mcg bid. QTc 525 msec on ECG today. - Increase Toprol as above.  - Off ASA with warfarin. INR followed by PCP. Check INR and CBC today. 4. NSCLC RUL: Completed SBRT.  Followed by radiation oncology. Followup CT in 8/23 reviewed with pulmonary, no acute changes 5. Bioprosthetic AVR: Stable on recent echo and TEE. 6. Type 2DM: Recent A1c 6.9. - off SLGT2i given UTIs.  7. PAD:  AAA s/p EVR, LE PAD + Carotid Artery Disease s/p Rt CEA. No current claudication symptoms.  - Followed by Dr. Trula Slade. - Continue lipid lowering agents per above   Follow up with APP 2 weeks after cardioversion.  [facility] FNP-BC 09/09/2022

## 2022-09-09 NOTE — Patient Instructions (Addendum)
Labs done today. We will contact you only if your labs are abnormal.  INCREASE Metoprolol to 25mg  (1 tablet) by mouth 2 times daily.   No other medication changes were made. Please continue all current medications as prescribed.  Your physician recommends that you schedule a follow-up appointment in: 2 weeks post cath with our NP/PA.   If you have any questions or concerns before your next appointment please send [place] a message through [place] or call our office at [phone].    TO LEAVE A MESSAGE FOR THE NURSE SELECT OPTION 2, PLEASE LEAVE A MESSAGE INCLUDING: YOUR NAME DATE OF BIRTH CALL BACK NUMBER REASON FOR CALL**this is important as we prioritize the call backs  YOU WILL RECEIVE A CALL BACK THE SAME DAY AS LONG AS YOU CALL BEFORE 4:00 PM   Do the following things EVERYDAY: Weigh yourself in the morning before breakfast. Write it down and keep it in a log. Take your medicines as prescribed Eat low salt foods--Limit salt (sodium) to 2000 mg per day.  Stay as active as you can everyday Limit all fluids for the day to less than 2 liters   At the [facility], you and your health needs are our priority. As part of our continuing mission to provide you with exceptional heart care, we have created designated Provider Care Teams. These Care Teams include your primary Cardiologist (physician) and Advanced Practice Providers (APPs- Physician Assistants and Nurse Practitioners) who all work together to provide you with the care you need, when you need it.   You may see any of the following providers on your designated Care Team at your next follow up: Dr Glori Bickers Dr Haynes Kerns, NP Lyda Jester, [place] Audry Riles, PharmD   Please be sure to bring in all your medications bottles to every appointment.   You are scheduled for a Cardioversion on 09/23/22 with Dr. Aundra Dubin.  Please arrive at the [facility] (Main Entrance A) at [facility]:  [address] [place], [place] [address] at 9:00 am. (1 hour prior to procedure unless lab work is needed; if lab work is needed arrive 1.5 hours ahead)  DIET: Nothing to eat or drink after midnight except a sip of water with medications (see medication instructions below)  FYI: For your safety, and to allow [place] to monitor your vital signs accurately during the surgery/procedure we request that   if you have artificial nails, gel coating, SNS etc. Please have those removed prior to your surgery/procedure. Not having the nail coverings /polish removed may result in cancellation or delay of your surgery/procedure.  Medication Instructions: Hold Lasix the day of your procedure.   You must have a responsible person to drive you home and stay in the waiting area during your procedure. Failure to do so could result in cancellation.  Bring your insurance cards.  *Special Note: Every effort is made to have your procedure done on time. Occasionally there are emergencies that occur at the hospital that may cause delays. Please be patient if a delay does occur.

## 2022-09-09 NOTE — H&P (View-Only) (Signed)
Patient ID: Nathaniel [name]., male   DOB: Dec 19, 1931, 88 y.o.   MRN: [id number] PCP: Dr. Elyse [name] Cardiology: Dr. Aundra [name]  88 y.o. with history of extensive vascular disease including CAD s/p CABG and redo CABG, AAA s/p endovascular repair in 2015, PAD, and carotid stenosis as well as prior AVR.                            After his AAA repair, he was noted to be in atrial fibrillation.  He had had a prior episode of atrial fibrillation after his cardiac surgery in 2007.  Atrial fibrillation was rate-controlled.  He was started on warfarin and Toprol XL.  He developed exertional dyspnea and fatigue and was cardioverted back to SR 06/06/14.  Echo 06/15 EF worsened to 25-30% with stable bioprosthetic aortic valve, mildly dilated RV with normal systolic function.  [place] 07/15 showed stable anatomy.  The LIMA-LAD was patent, the sequential SVG-OM/ramus/D was patent only to ramus but this is the same as the prior cath, and the SVG-PDA was patent with a long 50-60% mid-graft stenosis.  No intervention.  Of note, at cath he was noted to be back in atrial fibrillation.  He was started on amiodarone and had DCCV again on 08/21/14.  This was successful.  He has been off amiodarone due to side effects (hallucinations).  Echo in 1/16 showed recovery of EF to 55-60%.    He was admitted in 1/19 with bronchitis/wheezing/dyspnea and was found to be in atrial fibrillation with RVR.  He tested positive for metapneumovirus.  Echo showed EF 45-50% with normal  bioprosthetic aortic valve. He was diuresed for volume overload while in the hospital. He had DCCV to NSR.    Carotid dopplers in 1/19 showed 80-99% RICA stenosis, he had right CEA in 4/31 with no complications.   At appointment in early 6/20, Nathaniel [name] was noted to be back in atrial fibrillation with RVR and mild CHF.  He was started on Toprol XL and Lasix was increased.  He had TEE-guided DCCV to NSR in 6/20.  TEE showed EF 50%, mild diffuse hypokinesis, mildly decreased RV systolic function, normally functioning bioprosthetic aortic valve.   In 2021, he was admitted for Tikosyn initiation.  In 12/21, he went into atrial fibrillation again and was cardioverted back to NSR.   Echo in 1/22 showed EF 45-50%, diffuse hypokinesis, mildly decreased RV systolic function, moderate biatrial enlargement, bioprosthetic valve with mean gradient 8 mmHg.    In 2/22, he went into atrial fibrillation and had TEE-guided DCCV back to NSR.  TEE showed EF 45-50%.    In 3/22, he was back in the ER with atrial fibrillation triggered by [name] most likely.  He had a cardioversion back to NSR.   In 10/22, patient developed urinary retention and an E coli UTI.  In the setting of this, he had a fall at home and was unable to get up due to weakness for hours.  He developed rhabdomyolysis.  This resolved with minimal renal damage with IV fluid hydration and he was discharged to rehab.   Follow up 2/23 he was in atrial fibrillation, TEE/DCCV arranged however INR subtherapeutic. He cancelled rescheduled procedure due to a death in the family.  Follow up 3/23. Remained in AF. Volume overloaded. Lasix increased for 3 days. Underwent TEE guided DCCV on 03/19/22.  TEE showed EF 50-55%, mildly decreased RV function, normal bioprosthetic aortic valve, mild Nathaniel and mild  mitral stenosis (mean gradient 3 mmHg).    Recently diagnosed with stage IA2 NSCLC RUL. Saw Radiation Oncology and Pulmonary and decided on empiric treatment  with SBRT.  He has completed radiation.   Admitted 9/23 with NSTEMI and AF with RVR. Underwent R/LHC showing normal filling pressures, known occlusion of native coronaries and SVG-RCA from original CABG. New occlusion of SVG-PDA from CABG#2. Long 99% stenosis in the SVG-ramus. Ramus is relatively small vessel. Patent LIMA-LAD with collaterals to RCA territory. Case was discussed with Nathaniel [name], wouldn't benefit from intervention, medically managed. Patient declined re-challenge of amiodarone and was able to have successful TEE/DCCV to NSR. TEE showed EF 45% with normal RV, mild-moderate Nathaniel, normal bioprosthetic aortic valve, no LA appendage thrombus. Discharged home, weight 184 lbs.  Today he returns for post hospitalization HF follow up. Overall feeling poorly. He is SOB after walking 25-30 yards but not specifically fatigued. He lives at [facility] assisted living. Denies CP, dizziness, edema, or PND/Orthopnea. Appetite ok. No fever or chills. Weight at home 186 pounds. Taking all medications. Enjoys going to the driving range and stock trading.  ECG (personally reviewed): atrial fibrillation, 116 bpm  Labs (5/15): K 4.3, creatinine 0.89 Labs (6/15): K 4.3, creatinine 1.4, BNP 112 Labs (7/15): K 4.4, creatinine 1.2, HCT 40.2 Labs (8/15): K 4, creatinine 1.2, BNP 113, LFTs and TSH normal Labs (10/15): K 4.4, creatinine 1.4 Labs (5/16): K 4.7, creatinine 1.18, HDL 52, LDL 98 Labs (6/16): LDL 118, LFTs normal Labs (1/17): HCT 40.2, K 3.7, creatinine 1.04 Labs (1/19): K 4.9, creatinine 1.19 Labs (2/19): K 4.2, creatinine 0.89 Labs (3/19): LDL 86, HDL 45, TGs 245 Labs (5/19): LDL 110, HDL 39 Labs (6/19): K 4.1, creatinine 1.07 Labs (11/19): LDL 14, HDL 56 Labs (6/20): K 4.1, creatinine 1.0 Labs (7/20): K 4.3, creatinine 1.07 Labs (9/21): LDL 129, hgb 14.8, K 4.1, creatinine 1.02 Labs (12/21): LDL 20, HDL 46, K 4.4, creatinine 1.23 Labs (1/22): LDL 45, HDL 49, K 4.3, creatinine 1.1, BNP  184 Labs (3/22): K 4.4, creatinine 1.10, LDL 15, HDL 49 Labs (6/22): K 4.3, creatinine 1.11, LDL 121 Labs (7/22): K 4.3, creatinine 1.23, hgb 14.5 Labs (11/22): K 4.5, creatinine 1.14 Labs (2/23): K 4.3, creatinine 1.16 Labs (7/23): K 4.4, creatinine 1.24 Labs (9/23): K 4.1, creatinine 1.19  PMH: 1. PAD: Peripheral arterial dopplers in 2012 showed > 75% bilateral SFA stenosis. Peripheral arterial dopplers in 10/14 showed > 50% focal bilateral SFA stenoses.  2. AAA: [place] 4/15 with 4.4 cm AAA but concern for penetrating ulcers. CTA abdomen showed 4.4 cm AAA with penetrating ulcers and possible pseudoaneurysms. Now s/p endovascular repair of AAA in 9/93 with no complications.  3. Carotid stenosis: Carotid dopplers (4/15) with 60-79% bilateral ICA stenosis. Carotid dopplers (11/15) with 60-79% bilateral ICA stenosis.  Carotid dopplers 6/16 with 60-79% bilateral ICA stenosis. - Carotid dopplers (1/18): 60-79% RICA stenosis.  - Carotid dopplers (1/19): 80-99% RICA stenosis => Right CEA in 2/19.  - Carotid dopplers (11/19): 40-59% bilateral stenosis.  - Carotid dopplers (1/21): 1-39% BICA stenosis.  4. CAD: CABG 1989 with LIMA-LAD, SVG-D, seq SVG-ramus and OM, sequential SVG-PDA/PLV. SVG-PDA/PLV found to be occluded on cath prior to AVR, so patient had SVG-PDA with AVR in 2007. LHC (7/15) with LIMA-LAD patent with 40-50% stenosis in LAD after touchdown, sequential SVG-ramus/OM/diagonal with only the ramus branch still intact (known from prior cath), SVG-PDA from original surgery TO, SVG-PDA from redo surgery with long 50-60% mid-graft stenosis.  No target  for intervention.  - LHC (9/23): known occlusion of native coronaries and SVG-RCA from original CABG, new occlusion of SVG-PDA from CABG#2, long 99% stenosis in the SVG-ramus (was sequential SVG-ramus/OM/D but OM and D branches noted to be lost on prior cath), ramus is relatively small vessel, patent LIMA-LAD with collaterals to RCA territory. Case  reviewed with Nathaniel [name], probably not much benefit to intervention on SVG-ramus. Small territory covered and complex disease in SVG. Medical management advised. 5. Severe aortic stenosis: Bioprosthetic AVR in 2007.  6. Atrial fibrillation: Paroxysmal. Initially noted after cardiac surgery, then again after AAA repair.  DCCV to NSR 06/06/14.  DCCV to NSR again 08/21/14.  - DCCV to NSR in 1/19.  - DCCV to NSR in 6/20.  - Now on Tikosyn.  - DCCV to NSR 12/21.  - DCCV to NSR 2/22. - DCCV to NSR 3/22. - DCCV to NSR 4/23. - DCCV to NSR 9/23. 7. Type II diabetes  8. Gout  9. GERD  10. Hyperlipidemia  11. HTN 12. GERD  13. Chronic systolic CHF: Echo (7/56) with EF worsened to 25-30% with grade II diastolic dysfunction, stable bioprosthetic aortic valve, mildly dilated RV with normal systolic function.  Echo (1/16) with EF 55-60%, basal inferior hypokinesis, mild LVH, bioprosthetic aortic valve with mean gradient 13 mmHg, mild Nathaniel, severe LAE.  - Echo (1/19): EF 45-50%, mild Nathaniel, normal bioprosthetic aortic valve.  - TEE (6/20): EF 50% with mild diffuse hypokinesis, mildly decreased RV systolic function with mild RV enlargement, bioprosthetic aortic valve looked ok, mild Nathaniel. - Echo (1/22):  EF 45-50%, diffuse hypokinesis, mildly decreased RV systolic function, moderate biatrial enlargement, bioprosthetic valve with mean gradient 8 mmHg.   - TEE (2/22): EF 45-50%, RV normal, moderate biatrial enlargement, bioprosthetic AoV with mean gradient 6 mmHg, mild Nathaniel.  - TEE (4/23): EF 50-55%, mildly decreased RV function, normal bioprosthetic aortic valve, mild Nathaniel and mild mitral stenosis (mean gradient 3 mmHg) - TEE (9/23): EF 45%, RV normal, no LAA thrombus, no PFO/ASD, mild to moderate Nathaniel w/ moderate MAC, AoV without stenosis/regurgitation 14. BPH  15. Nonsmall cell lung cancer: Suspected by imaging, treated in 2023 with radiation.  70. Fall with rhabdomyolysis (10/22)   SH: Widower, 2 children, raised his  3 grand-daughters, lives in [place], [place].  Occasional ETOH, no smoking.   FH: Father died from ruptured AAA.   ROS: All systems reviewed and negative except as per HPI.   Current Outpatient Medications  Medication Sig Dispense Refill   atorvastatin (LIPITOR) 20 MG tablet Take 1 tablet (20 mg total) by mouth daily. Alternating with 56m 30 tablet 5   Coenzyme Q10 (CO Q10) 200 MG CAPS Take 200 mg by mouth daily.     dofetilide (TIKOSYN) 250 MCG capsule TAKE ONE CAPSULE TWICE A DAY 60 capsule 11   dorzolamide-timolol (COSOPT) 22.3-6.8 MG/ML ophthalmic solution Place 1 drop into both eyes 2 (two) times daily.     ezetimibe (ZETIA) 10 MG tablet Take 1 tablet (10 mg total) by mouth at bedtime. 90 tablet 3   furosemide (LASIX) 20 MG tablet Patient takes 2 tablets by mouth in the morning and 1 tablet at night.     latanoprost (XALATAN) 0.005 % ophthalmic solution Place 1 drop into the left eye at bedtime.     metoprolol succinate (TOPROL-XL) 25 MG 24 hr tablet Take 1 tablet (25 mg total) by mouth daily. 90 tablet 3   Misc Natural Products (GLUCOSAMINE CHONDROITIN TRIPLE) TABS Take 1 tablet  by mouth daily.     ramipril (ALTACE) 2.5 MG capsule Take 1 capsule (2.5 mg total) by mouth daily. 90 capsule 3   REPATHA SURECLICK 032 MG/ML SOAJ INJECT 1 PEN INTO SKIN EVERY 14 DAYS 2 mL 11   spironolactone (ALDACTONE) 25 MG tablet Take 1 tablet (25 mg total) by mouth daily. 30 tablet 5   tamsulosin (FLOMAX) 0.4 MG CAPS capsule Take 1 capsule (0.4 mg total) by mouth 2 (two) times daily. 180 capsule 3   warfarin (COUMADIN) 3 MG tablet Tomorrow (9/12) take 2 tablets (63m) Starting 9/13 take 3 tablets (946m daily or as directed by [facility] 90 tablet 2   No current facility-administered medications for this encounter.   BP 106/70   Pulse (!) 113   Wt 86 kg (189 lb 9.6 oz)   SpO2 97%   BMI 27.20 kg/m    Wt Readings from Last 3 Encounters:  09/09/22 86 kg (189 lb 9.6 oz)  08/24/22 83.3 kg  (183 lb 10.3 oz)  07/15/22 87.6 kg (193 lb 3.2 oz)   Physical Exam: General:  NAD. No resp difficulty, elderly, walked into clinic. HEENT: Normal Neck: Supple. No JVD. Carotids 2+ bilat; no bruits. No lymphadenopathy or thryomegaly appreciated. Cor: PMI nondisplaced. Irregular rate & rhythm. No rubs, gallops or murmurs. Lungs: Clear Abdomen: Soft, nontender, nondistended. No hepatosplenomegaly. No bruits or masses. Good bowel sounds. Extremities: No cyanosis, clubbing, rash, edema Neuro: Alert & oriented x 3, cranial nerves grossly intact. Moves all 4 extremities w/o difficulty. Affect pleasant.  Assessment/Plan:  1.  CAD: s/p CABG and Redo CABG. LHC 06/2014 showed significant coronary disease w/ no good interventional targets (LIMA-LAD patent with 40-50% stenosis in LAD after touchdown, sequential SVG-ramus/OM/diagonal with only the ramus branch still intact (known from prior cath), SVG-PDA from original surgery TO, SVG-PDA from redo surgery with long 50-60% mid-graft stenosis). NSTEMI this admission (9/23), repeat LHC showed occlusion of the 2nd SVG-PDA and 99% stenosis in the SVG-ramus.  Reviewed with Dr. ThAli Loweprobably not much benefit to intervention on SVG-ramus.  Small territory covered and complex disease in the SVG.  Plan medical management.  - Now off ASA. - Continue atorvastatin 20 mg QOD + Zetia.  - Continue Repatha. LDL excellent at 26.  2.  Chronic HF with mid-range EF: Echo this admission with stable EF 45-50%, LV global hypokinesis, RV systolic function normal. Filling pressures normal on RHC this admission (9/23). NYHA III-IIIb, he is not volume overloaded today. Suspect worsening symptoms due to return of AF (see below). - Increase Toprol XL to 25 mg bid. - Continue Lasix 40 mg q am/20 mg qpm. BMET and BNP today. - Continue ramipril 2.5 mg daily. - Continue spironolactone 25 mg daily. - Off Jardiance with recent E coli UTI.  - Continue Toprol XL 25 mg daily. 3. Atrial  fibrillation: Difficult to control, failed multiple cardioversions.  Failed amiodarone due to CNS SE, feels strongly against trying again. Offered ablation by EP but he declined. Had been primarily in NSR on Tikosyn.  In setting of NSTEMI, he developed recurrent AF. S/p TEE/DCCV with successful conversion to NSR. Unfortunately he is in atrial fibrillation today, HR 116 bpm. We discussed re-challenge with amiodarone, he strongly declined. Discussed repeating DCCV and referring to EP/AF clinic to discuss options. Age may be prohibitive for ablation candidacy. He is agreeable for re-attempt at cardioversion. Check INR, if subtherapeutic, will need a TEE prior to cardioverson. Discussed with Dr. McAundra Dubinwill arrange for DCCV (+/-  TEE) soon. - Continue Tikosyn 250 mcg bid. QTc 525 msec on ECG today. - Increase Toprol as above.  - Off ASA with warfarin. INR followed by PCP. Check INR and CBC today. 4. NSCLC RUL: Completed SBRT.  Followed by radiation oncology. Followup CT in 8/23 reviewed with pulmonary, no acute changes 5. Bioprosthetic AVR: Stable on recent echo and TEE. 6. Type 2DM: Recent A1c 6.9. - off SLGT2i given UTIs.  7. PAD:  AAA s/p EVR, LE PAD + Carotid Artery Disease s/p Rt CEA. No current claudication symptoms.  - Followed by Dr. Trula Slade. - Continue lipid lowering agents per above   Follow up with APP 2 weeks after cardioversion.  Maricela Bo [facility] FNP-BC 09/09/2022

## 2022-09-16 ENCOUNTER — Encounter (HOSPITAL_COMMUNITY): Payer: Self-pay | Admitting: Physician Assistant

## 2022-09-16 ENCOUNTER — Ambulatory Visit (HOSPITAL_COMMUNITY)
Admission: RE | Admit: 2022-09-16 | Discharge: 2022-09-16 | Disposition: A | Discharge status: Home / Self Care | Payer: Self-pay | Source: Ambulatory Visit | Attending: Nurse Practitioner | Admitting: Nurse Practitioner

## 2022-09-16 VITALS — BP 102/70 | HR 112 | Ht 70.0 in | Wt 186.0 lb

## 2022-09-16 DIAGNOSIS — I5022 Chronic systolic (congestive) heart failure: Secondary | ICD-10-CM | POA: Insufficient documentation

## 2022-09-16 DIAGNOSIS — I130 Hypertensive heart and chronic kidney disease with heart failure and stage 1 through stage 4 chronic kidney disease, or unspecified chronic kidney disease: Secondary | ICD-10-CM | POA: Insufficient documentation

## 2022-09-16 DIAGNOSIS — Z79899 Other long term (current) drug therapy: Secondary | ICD-10-CM | POA: Insufficient documentation

## 2022-09-16 DIAGNOSIS — I739 Peripheral vascular disease, unspecified: Secondary | ICD-10-CM | POA: Insufficient documentation

## 2022-09-16 DIAGNOSIS — I714 Abdominal aortic aneurysm, without rupture, unspecified: Secondary | ICD-10-CM | POA: Insufficient documentation

## 2022-09-16 DIAGNOSIS — E1122 Type 2 diabetes mellitus with diabetic chronic kidney disease: Secondary | ICD-10-CM | POA: Insufficient documentation

## 2022-09-16 DIAGNOSIS — I4819 Other persistent atrial fibrillation: Secondary | ICD-10-CM | POA: Insufficient documentation

## 2022-09-16 DIAGNOSIS — Z7901 Long term (current) use of anticoagulants: Secondary | ICD-10-CM | POA: Insufficient documentation

## 2022-09-16 DIAGNOSIS — N189 Chronic kidney disease, unspecified: Secondary | ICD-10-CM | POA: Insufficient documentation

## 2022-09-16 DIAGNOSIS — Z952 Presence of prosthetic heart valve: Secondary | ICD-10-CM | POA: Insufficient documentation

## 2022-09-16 DIAGNOSIS — I2510 Atherosclerotic heart disease of native coronary artery without angina pectoris: Secondary | ICD-10-CM | POA: Insufficient documentation

## 2022-09-16 DIAGNOSIS — Z951 Presence of aortocoronary bypass graft: Secondary | ICD-10-CM | POA: Insufficient documentation

## 2022-09-16 DIAGNOSIS — D6869 Other thrombophilia: Secondary | ICD-10-CM

## 2022-09-16 NOTE — Progress Notes (Signed)
Primary Care Physician: Marin Olp, MD Referring Physician: Dr. Aundra Dubin EP: Dr. Pearlean Brownie Janes [place]. is a 88 y.o. male with  extensive vascular disease including CAD s/p CABG and redo CABG, AAA s/p repair, PAD, carotid stenosis as  prior AVR , as well as h/o paroxysmal afib, that is in the afib clinic for consideration for Tikosyn admit. He was seen by Dr. Aundra Dubin last week  and found to have ERAF after successful TEE/DCCV  6/11. Pt does  have RVR and has not tolerated BB's  for extreme fatigue or amiodarone in the remote past for suicidal ideations. Dr. 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   F/u in afib clinic, 02/20/21. He has had a cardioversion in February and saw Dr. Curt Bears in f/u and was in IXL. On arrival to clinic  this am, he became dizzy and was slumping forward and the nurse caught him and put in a wheelchair. He was found to be in afib with v rates in the 120's and BP on presentation of 82/50. He also had a dizzy spell at home this am but reports that he has felt good for the last several days, so was surprised that he was back in afib. He has had an escalation of afib over the last several months which he does not tolerate, despite being on tikosyn. He is currently tolerating BB's which he has not been able to tolerate in the past.  However,  he cannot tolerate amiodarone. After sitting, v rates slowed but BP still at 98/60, no room to uptitrate BB. Dr.Camnitz did offer the pt an ablation on his last visit but the pt was undecided. He is on coumadin with a CHA2DS2VASc score of at least 6.  F/u ER, 02/25/21,  visit for urgent cardioversion. It was successful and remains in SR. He feels  the several doses of [name] that he took the previous week was the trigger for afib. There was also a note from [facility] [place],  to consider  reducing  BP meds,  If BP was soft today, as he becomes hypotensive in afib. Will reduce ACE to 2.5 mg bid  for BP 112/70 today. He still is not interested in an ablation.    Follow up in the AF clinic 09/16/22. Patient has had several cardioversions since in the past two years. Most recently during a hospitalization 08/24/22. He was found to be back in afib at follow up 09/09/22 and his metoprolol was increased. He is scheduled for DCCV on 09/23/22. He does feel SOB with exertion and has palpitations. He does note a 3 lbs weight loss.   Today, he denies symptoms of chest pain, orthopnea, PND, dizziness, presyncope, syncope, or neurologic sequela. The patient is tolerating medications without difficulties and is otherwise without complaint today.   Past Medical History:  Diagnosis Date   AAA (abdominal aortic aneurysm) (HCC)    Atrial fibrillation (HCC)    Atrial fibrillation (HCC)    CAD (coronary artery disease)    Cataract    CHF (congestive heart failure) (HCC)    Chronic kidney disease    Colon polyps    adenomatous   Diabetes mellitus    Diverticulosis    Dysrhythmia    A-Fib   GERD (gastroesophageal reflux disease)  Gout    Hernia, abdominal    History of aortic valve replacement with bioprosthetic valve    2007   Hx: UTI (urinary tract infection)    Hyperlipemia    Hypertension    Internal hemorrhoids    Peripheral vascular disease (HCC)    Shortness of breath    with exertion   Past Surgical History:  Procedure Laterality Date   ABDOMINAL AORTIC ENDOVASCULAR STENT GRAFT N/A 04/13/2014   Procedure: ABDOMINAL AORTIC ENDOVASCULAR STENT GRAFT;  Surgeon: Serafina Mitchell, MD;  Location: [place];  Service: Vascular;  Laterality: N/A;   ANGIOPLASTY     Unsure if he had stents put in    [place] N/A 06/06/2014   Procedure: CARDIOVERSION;  Surgeon: Larey Dresser, MD;  Location: [place];  Service: Cardiovascular;  Laterality:  N/A;   CARDIOVERSION N/A 08/21/2014   Procedure: CARDIOVERSION;  Surgeon: Larey Dresser, MD;  Location: [place];  Service: Cardiovascular;  Laterality: N/A;   CARDIOVERSION N/A 12/22/2017   Procedure: CARDIOVERSION;  Surgeon: Larey Dresser, MD;  Location: [place];  Service: Cardiovascular;  Laterality: N/A;   CARDIOVERSION N/A 05/25/2019   Procedure: CARDIOVERSION;  Surgeon: Larey Dresser, MD;  Location: [place];  Service: Cardiovascular;  Laterality: N/A;   CARDIOVERSION N/A 06/14/2019   Procedure: CARDIOVERSION;  Surgeon: Jerline Pain, MD;  Location: [facility] ENDOSCOPY;  Service: Cardiovascular;  Laterality: N/A;   CARDIOVERSION N/A 12/12/2020   Procedure: CARDIOVERSION;  Surgeon: Larey Dresser, MD;  Location: [facility] ENDOSCOPY;  Service: Cardiovascular;  Laterality: N/A;   CARDIOVERSION N/A 01/29/2021   Procedure: CARDIOVERSION;  Surgeon: Larey Dresser, MD;  Location: [facility] ENDOSCOPY;  Service: Cardiovascular;  Laterality: N/A;   CARDIOVERSION N/A 03/19/2022   Procedure: CARDIOVERSION;  Surgeon: Larey Dresser, MD;  Location: [facility] ENDOSCOPY;  Service: Cardiovascular;  Laterality: N/A;   CARDIOVERSION N/A 08/24/2022   Procedure: CARDIOVERSION;  Surgeon: Larey Dresser, MD;  Location: [facility] ENDOSCOPY;  Service: Cardiovascular;  Laterality: N/A;   CORONARY ARTERY BYPASS GRAFT     ENDARTERECTOMY Right 02/02/2018   Procedure: ENDARTERECTOMY CAROTID RIGHT;  Surgeon: Serafina Mitchell, MD;  Location: [facility] OR;  Service: Vascular;  Laterality: Right;   EYE SURGERY Bilateral    cataract surgery   HERNIA REPAIR     2 operations   LEFT HEART CATHETERIZATION WITH CORONARY/GRAFT ANGIOGRAM N/A 07/10/2014   Procedure: LEFT HEART CATHETERIZATION WITH Beatrix Fetters;  Surgeon: Larey Dresser, MD;  Location: [facility] CATH LAB;  Service: Cardiovascular;  Laterality: N/A;   RIGHT HEART CATH AND CORONARY/GRAFT ANGIOGRAPHY N/A 08/21/2022   Procedure: RIGHT HEART CATH AND CORONARY/GRAFT ANGIOGRAPHY;   Surgeon: Larey Dresser, MD;  Location: [place] CV LAB;  Service: Cardiovascular;  Laterality: N/A;   TEE WITHOUT CARDIOVERSION N/A 05/25/2019   Procedure: TRANSESOPHAGEAL ECHOCARDIOGRAM (TEE);  Surgeon: Larey Dresser, MD;  Location: [facility] ENDOSCOPY;  Service: Cardiovascular;  Laterality: N/A;   TEE WITHOUT CARDIOVERSION N/A 01/29/2021   Procedure: TRANSESOPHAGEAL ECHOCARDIOGRAM (TEE);  Surgeon: Larey Dresser, MD;  Location: [facility] ENDOSCOPY;  Service: Cardiovascular;  Laterality: N/A;   TEE WITHOUT CARDIOVERSION N/A 03/19/2022   Procedure: TRANSESOPHAGEAL ECHOCARDIOGRAM (TEE);  Surgeon: Larey Dresser, MD;  Location: [facility] ENDOSCOPY;  Service: Cardiovascular;  Laterality: N/A;   TEE WITHOUT CARDIOVERSION N/A 08/24/2022   Procedure: TRANSESOPHAGEAL ECHOCARDIOGRAM (TEE);  Surgeon: Larey Dresser, MD;  Location: [facility] ENDOSCOPY;  Service: Cardiovascular;  Laterality: N/A;  TONSILLECTOMY     VIDEO BRONCHOSCOPY Bilateral 12/25/2015   Procedure: VIDEO BRONCHOSCOPY WITHOUT FLUORO;  Surgeon: Javier Glazier, MD;  Location: [place];  Service: Cardiopulmonary;  Laterality: Bilateral;    Current Outpatient Medications  Medication Sig Dispense Refill   atorvastatin (LIPITOR) 20 MG tablet Take 1 tablet (20 mg total) by mouth daily. Alternating with 30mg  30 tablet 5   Coenzyme Q10 (CO Q10) 200 MG CAPS Take 200 mg by mouth daily.     dofetilide (TIKOSYN) 250 MCG capsule TAKE ONE CAPSULE TWICE A DAY 60 capsule 11   dorzolamide-timolol (COSOPT) 22.3-6.8 MG/ML ophthalmic solution Place 1 drop into both eyes 2 (two) times daily.     ezetimibe (ZETIA) 10 MG tablet Take 1 tablet (10 mg total) by mouth at bedtime. 90 tablet 3   furosemide (LASIX) 20 MG tablet Patient takes 2 tablets by mouth in the morning and 1 tablet at night.     latanoprost (XALATAN) 0.005 % ophthalmic solution Place 1 drop into the left eye at bedtime.     metoprolol succinate (TOPROL-XL) 25 MG 24 hr tablet Take 1 tablet (25 mg total) by  mouth 2 (two) times daily. 180 tablet 3   Misc Natural Products (GLUCOSAMINE CHONDROITIN TRIPLE) TABS Take 1 tablet by mouth daily.     ramipril (ALTACE) 2.5 MG capsule Take 1 capsule (2.5 mg total) by mouth daily. 90 capsule 3   REPATHA SURECLICK 564 MG/ML SOAJ INJECT 1 PEN INTO SKIN EVERY 14 DAYS 2 mL 11   spironolactone (ALDACTONE) 25 MG tablet Take 1 tablet (25 mg total) by mouth daily. 30 tablet 5   tamsulosin (FLOMAX) 0.4 MG CAPS capsule Take 1 capsule (0.4 mg total) by mouth 2 (two) times daily. 180 capsule 3   warfarin (COUMADIN) 3 MG tablet Tomorrow (9/12) take 2 tablets (6mg ) Starting 9/13 take 3 tablets (9mg ) daily or as directed by Coumadin Clinic 90 tablet 2   No current facility-administered medications for this encounter.    Allergies  Allergen Reactions   Barbiturates    Grass Extracts [Gramineae Pollens] Other (See Comments)    Sneezing and runny nose   Milk-Related Compounds Other (See Comments)    Just Yogurt.  Makes him very lethargic.   Amiodarone Anxiety    depression    Social History   Socioeconomic History   Marital status: Widowed    Spouse name: Not on file   Number of children: Not on file   Years of education: Not on file   Highest education level: Not on file  Occupational History   Occupation: [employer]or   Tobacco Use   Smoking status: Former    Packs/day: 0.25    Years: 20.00    Total pack years: 5.00    Types: Cigarettes    Quit date: 03/23/1992    Years since quitting: 30.5    Passive exposure: Past   Smokeless tobacco: Never   Tobacco comments:    Former smoker 09/16/22  Vaping Use   Vaping Use: Never used  Substance and Sexual Activity   Alcohol use: Yes    Alcohol/week: 3.0 standard drinks of alcohol    Types: 3 Glasses of wine per week   Drug use: No   Sexual activity: Not Currently  Other Topics Concern   Not on file  Social History Narrative   Widowed (lives alone at [place])- wife died at 52 (cancer at 55). Daughter  31, son 54 in 2023. 5 grandkids. 5 greatgrandkids.    -  when patient were 40- started caring for 3 of his grandkids when daughter was not in a good place in the airforce.       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       Hobbies: golf, enjoys football and basketball at [employer] Strain: Low Risk  (10/13/2021)   Overall Financial Resource Strain (CARDIA)    Difficulty of Paying Living Expenses: Not very hard  Food Insecurity: No Food Insecurity (08/21/2022)   Hunger Vital Sign    Worried About Running Out of Food in the Last Year: Never true    Ran Out of Food in the Last Year: Never true  Transportation Needs: No Transportation Needs (08/21/2022)   PRAPARE - Hydrologist (Medical): No    Lack of Transportation (Non-Medical): No  Physical Activity: Not on file  Stress: Not on file  Social Connections: Not on file  Intimate Partner Violence: Not At Risk (08/21/2022)   Humiliation, Afraid, Rape, and Kick questionnaire    Fear of Current or Ex-Partner: No    Emotionally Abused: No    Physically Abused: No    Sexually Abused: No    Family History  Problem Relation Age of Onset   Heart disease Mother    Heart disease Father        before age 74   AAA (abdominal aortic aneurysm) Father    Colon cancer Neg Hx     ROS- All systems are reviewed and negative except as per the HPI above  Physical Exam: Vitals:   09/16/22 1506  BP: 102/70  Pulse: (!) 112  Weight: 84.4 kg  Height: 5\' 10"  (1.778 m)    Wt Readings from Last 3 Encounters:  09/16/22 84.4 kg  09/09/22 86 kg  08/24/22 83.3 kg    Labs: Lab Results  Component Value Date   NA 137 09/09/2022   K 4.8 09/09/2022   CL 103 09/09/2022   CO2 29 09/09/2022   GLUCOSE 165 (H) 09/09/2022   BUN 19 09/09/2022   CREATININE 1.13 09/09/2022   CALCIUM 9.3 09/09/2022   PHOS 3.2 12/22/2017   MG 2.1 08/24/2022   Lab Results  Component Value  Date   INR 1.9 (H) 09/09/2022   Lab Results  Component Value Date   CHOL 81 08/22/2022   HDL 36 (L) 08/22/2022   LDLCALC 26 08/22/2022   TRIG 97 08/22/2022    GEN- The patient is a well appearing elderly male, alert and oriented x 3 today.   HEENT-head normocephalic, atraumatic, sclera clear, conjunctiva pink, hearing intact, trachea midline. Lungs- Clear to ausculation bilaterally, normal work of breathing Heart- irregular rate and rhythm, no murmurs, rubs or gallops  GI- soft, NT, ND, + BS Extremities- no clubbing, cyanosis, or edema MS- no significant deformity or atrophy Skin- no rash or lesion Psych- euthymic mood, full affect Neuro- strength and sensation are intact   EKG -  Afib, IVCD Vent. rate 112 BPM PR interval * ms QRS duration 136 ms QT/QTcB 392/535 ms   Assessment and Plan: 1. Persistent afib  Pt failed amiodarone due to side effects of hallucinations and suicidal ideations (remote use in 2015). He does not want to rechallenge this medication.  Scheduled for DCCV on 09/23/22 Continue dofetilide 250 mcg BID Continue Toprol 25 mg BID, will not increase given borderline BP.  His options for rhythm control are limited. He inquires about ablation, likely not  a good candidate given age and comorbidities. ? AV node ablation. Will get him follow up with Dr Curt Bears post DCCV.  Continue warfarin for CHA2DS2VASc score of of at least 6.    2. Chronic HFmrEF EF 45-50% Followed in [facility] Weight down 3 lbs, fluid status appears stable.   3. Bioprosthetic AV Followed by Dr Aundra Dubin  4. PAD AAA s/p EVR Carotid artery disease s/p CEA Followed by Dr Trula Slade  5. CAD S/p CABG [facility] 08/2022, medical management recommended.  No anginal symptoms today.   Follow up with Dr Curt Bears at next available appointment. [facility] as scheduled post DCCV.    [facility] [address] [place], [place] [address] [phone]

## 2022-09-22 ENCOUNTER — Telehealth (HOSPITAL_COMMUNITY): Payer: Self-pay | Admitting: Cardiology

## 2022-09-22 ENCOUNTER — Other Ambulatory Visit (HOSPITAL_COMMUNITY): Payer: Self-pay

## 2022-09-22 DIAGNOSIS — I4819 Other persistent atrial fibrillation: Secondary | ICD-10-CM

## 2022-09-22 NOTE — Telephone Encounter (Signed)
2nd attempt to reach pt. No answer/left detailed vm.

## 2022-09-22 NOTE — Telephone Encounter (Signed)
I called pt and left vm for return.  You are scheduled for a Cardioversion on 09/23/22 with Dr. Aundra Dubin.  Please arrive at the [facility] (Main Entrance A) at [facility]: [address] [place], [place] [address] at 9:00 am. (1 hour prior to procedure unless lab work is needed; if lab work is needed arrive 1.5 hours ahead)   DIET: Nothing to eat or drink after midnight except a sip of water with medications (see medication instructions below)   FYI: For your safety, and to allow [place] to monitor your vital signs accurately during the surgery/procedure we request that   if you have artificial nails, gel coating, SNS etc. Please have those removed prior to your surgery/procedure. Not having the nail coverings /polish removed may result in cancellation or delay of your surgery/procedure.   Medication Instructions: Hold Lasix the day of your procedure.    You must have a responsible person to drive you home and stay in the waiting area during your procedure. Failure to do so could result in cancellation.   Bring your insurance cards.   *Special Note: Every effort is made to have your procedure done on time. Occasionally there are emergencies that occur at the hospital that may cause delays. Please be patient if a delay does occur

## 2022-09-22 NOTE — Telephone Encounter (Signed)
Please call pt  w/details regarding procedure scheduled 10/11

## 2022-09-23 ENCOUNTER — Ambulatory Visit (HOSPITAL_COMMUNITY): Payer: Self-pay | Admitting: Anesthesiology

## 2022-09-23 ENCOUNTER — Ambulatory Visit (INDEPENDENT_AMBULATORY_CARE_PROVIDER_SITE_OTHER): Payer: Self-pay

## 2022-09-23 ENCOUNTER — Ambulatory Visit (HOSPITAL_COMMUNITY)
Admission: RE | Admit: 2022-09-23 | Discharge: 2022-09-23 | Disposition: A | Discharge status: Home / Self Care | Payer: Self-pay | Attending: Cardiology | Admitting: Cardiology

## 2022-09-23 ENCOUNTER — Ambulatory Visit (HOSPITAL_BASED_OUTPATIENT_CLINIC_OR_DEPARTMENT_OTHER): Payer: Self-pay | Admitting: Anesthesiology

## 2022-09-23 ENCOUNTER — Other Ambulatory Visit: Payer: Self-pay

## 2022-09-23 ENCOUNTER — Encounter (HOSPITAL_COMMUNITY)
Admission: RE | Disposition: A | Discharge status: Home / Self Care | Payer: Self-pay | Source: Home / Self Care | Attending: Cardiology

## 2022-09-23 ENCOUNTER — Ambulatory Visit (HOSPITAL_BASED_OUTPATIENT_CLINIC_OR_DEPARTMENT_OTHER)
Admission: RE | Admit: 2022-09-23 | Discharge: 2022-09-23 | Disposition: A | Discharge status: Home / Self Care | Payer: Self-pay | Source: Home / Self Care | Attending: Cardiology | Admitting: Cardiology

## 2022-09-23 DIAGNOSIS — I340 Nonrheumatic mitral (valve) insufficiency: Secondary | ICD-10-CM | POA: Insufficient documentation

## 2022-09-23 DIAGNOSIS — I714 Abdominal aortic aneurysm, without rupture, unspecified: Secondary | ICD-10-CM | POA: Insufficient documentation

## 2022-09-23 DIAGNOSIS — Z87891 Personal history of nicotine dependence: Secondary | ICD-10-CM

## 2022-09-23 DIAGNOSIS — E1151 Type 2 diabetes mellitus with diabetic peripheral angiopathy without gangrene: Secondary | ICD-10-CM | POA: Insufficient documentation

## 2022-09-23 DIAGNOSIS — I4891 Unspecified atrial fibrillation: Secondary | ICD-10-CM

## 2022-09-23 DIAGNOSIS — I4819 Other persistent atrial fibrillation: Secondary | ICD-10-CM

## 2022-09-23 DIAGNOSIS — I110 Hypertensive heart disease with heart failure: Secondary | ICD-10-CM

## 2022-09-23 DIAGNOSIS — Z951 Presence of aortocoronary bypass graft: Secondary | ICD-10-CM | POA: Insufficient documentation

## 2022-09-23 DIAGNOSIS — Z5181 Encounter for therapeutic drug level monitoring: Secondary | ICD-10-CM

## 2022-09-23 DIAGNOSIS — I252 Old myocardial infarction: Secondary | ICD-10-CM

## 2022-09-23 DIAGNOSIS — I6523 Occlusion and stenosis of bilateral carotid arteries: Secondary | ICD-10-CM | POA: Insufficient documentation

## 2022-09-23 DIAGNOSIS — I2510 Atherosclerotic heart disease of native coronary artery without angina pectoris: Secondary | ICD-10-CM | POA: Insufficient documentation

## 2022-09-23 DIAGNOSIS — Z7901 Long term (current) use of anticoagulants: Secondary | ICD-10-CM | POA: Insufficient documentation

## 2022-09-23 DIAGNOSIS — Z953 Presence of xenogenic heart valve: Secondary | ICD-10-CM | POA: Insufficient documentation

## 2022-09-23 DIAGNOSIS — Z79899 Other long term (current) drug therapy: Secondary | ICD-10-CM | POA: Insufficient documentation

## 2022-09-23 DIAGNOSIS — I480 Paroxysmal atrial fibrillation: Secondary | ICD-10-CM | POA: Insufficient documentation

## 2022-09-23 DIAGNOSIS — I509 Heart failure, unspecified: Secondary | ICD-10-CM

## 2022-09-23 HISTORY — PX: TEE WITHOUT CARDIOVERSION: SHX5443

## 2022-09-23 HISTORY — PX: CARDIOVERSION: SHX1299

## 2022-09-23 LAB — PROTIME-INR
INR: 5.2 (ref 0.8–1.2)
Prothrombin Time: 47.6 seconds — ABNORMAL HIGH (ref 11.4–15.2)

## 2022-09-23 SURGERY — CARDIOVERSION
Anesthesia: General

## 2022-09-23 MED ORDER — PHENYLEPHRINE 80 MCG/ML (10ML) SYRINGE FOR IV PUSH (FOR BLOOD PRESSURE SUPPORT)
PREFILLED_SYRINGE | INTRAVENOUS | Status: DC | PRN
  Administered 2022-09-23: 160 ug via INTRAVENOUS
  Administered 2022-09-23 (×2): 320 ug via INTRAVENOUS

## 2022-09-23 MED ORDER — SODIUM CHLORIDE 0.9 % IV SOLN: INTRAVENOUS | Status: DC

## 2022-09-23 MED ORDER — PROPOFOL 500 MG/50ML IV EMUL
INTRAVENOUS | Status: DC | PRN
  Administered 2022-09-23: 100 ug/kg/min via INTRAVENOUS

## 2022-09-23 MED ORDER — EPHEDRINE SULFATE-NACL 50-0.9 MG/10ML-% IV SOSY
PREFILLED_SYRINGE | INTRAVENOUS | Status: DC | PRN
  Administered 2022-09-23: 15 mg via INTRAVENOUS
  Administered 2022-09-23: 10 mg via INTRAVENOUS

## 2022-09-23 NOTE — Patient Instructions (Signed)
Description   Called spoke with pt, advised to hold today and tomorrow's dosage of Warfarin, then resume same dosage of Warfarin 3 tablets daily.  Keep scheduled appt on 09/30/22 for INR recheck.  Stay consistent with greens each week. Coumadin Clinic [phone].

## 2022-09-23 NOTE — Transfer of Care (Signed)
Immediate Anesthesia Transfer of Care Note  Patient: Nathaniel [name].  Procedure(s) Performed: CARDIOVERSION TRANSESOPHAGEAL ECHOCARDIOGRAM (TEE)  Patient Location: PACU  Anesthesia Type:MAC  Level of Consciousness: drowsy  Airway & Oxygen Therapy: Patient Spontanous Breathing and Patient connected to nasal cannula oxygen  Post-op Assessment: Report given to RN  Post vital signs: Reviewed and stable  Last Vitals:  Vitals Value Taken Time  BP 91/57 09/23/22 1056  Temp    Pulse 77 09/23/22 1058  Resp 21 09/23/22 1058  SpO2 99 % 09/23/22 1058  Vitals shown include unvalidated device data.  Last Pain:  Vitals:   09/23/22 0932  TempSrc: Tympanic  PainSc: 0-No pain         Complications: No notable events documented.

## 2022-09-23 NOTE — CV Procedure (Signed)
Procedure: TEE  Sedation: Per anesthesiology  Indication: Atrial fibrillation  Findings: Please see echo section for full report.  Normal LV size with normal wall thickness.  EF 45%, diffuse hypokinesis.  Normal RV size with mildly decreased systolic function.  Mild left atrial enlargement, no LA appendage thrombus.  Mild right atrial enlargement.  No PFO or ASD by color doppler. Trivial TR.  Moderate mitral regurgitation with ERO 0.2 cm^2 and no flow reversal in the pulmonary vein doppler pattern.  The posterior mitral leaflet is thickened and restricted.  Bioprosthetic aortic valve with no significant stenosis or regurgitation, mean gradient 6 mmHg.  Normal caliber thoracic aorta with grade 3 plaque in the descending thoracic aorta.   Impression: No LA appendage thrombus, may proceed to DCCV.   Nathaniel [name] 09/23/2022 10:51 AM

## 2022-09-23 NOTE — Discharge Instructions (Signed)
Will need warfarin adjustment by coumadin clinic.  We will contact.

## 2022-09-23 NOTE — Progress Notes (Signed)
CRITICAL RESULT PROVIDER NOTIFICATION  Test performed and critical result:  INR- 5.2  Date and time result received:  09/23/2022,1013  Provider name/title: Dr. Aundra Dubin  Date and time provider notified: 09/23/2022, 1014  Date and time provider responded: 09/23/2022, 1014  Provider response:In department and No new orders

## 2022-09-23 NOTE — Progress Notes (Signed)
  Echocardiogram Echocardiogram Transesophageal has been performed.  Johny Chess 09/23/2022, 10:56 AM

## 2022-09-23 NOTE — Anesthesia Preprocedure Evaluation (Addendum)
Anesthesia Evaluation  Patient identified by MRN, date of birth, ID band Patient awake    Reviewed: Allergy & Precautions, H&P , NPO status , Patient's Chart, lab work & pertinent test results, reviewed documented beta blocker date and time   Airway Mallampati: III  TM Distance: >3 FB Neck ROM: Full    Dental no notable dental hx. (+) Teeth Intact, Dental Advisory Given   Pulmonary former smoker   Pulmonary exam normal breath sounds clear to auscultation       Cardiovascular hypertension, Pt. on medications and Pt. on home beta blockers + CAD, + Past MI, + CABG, + Peripheral Vascular Disease and +CHF  + dysrhythmias Atrial Fibrillation  Rhythm:Irregular Rate:Normal     Neuro/Psych negative neurological ROS  negative psych ROS   GI/Hepatic Neg liver ROS,GERD  ,,  Endo/Other  diabetes    Renal/GU Renal diseasenegative Renal ROS  negative genitourinary   Musculoskeletal   Abdominal   Peds  Hematology negative hematology ROS (+)   Anesthesia Other Findings   Reproductive/Obstetrics negative OB ROS                             Anesthesia Physical Anesthesia Plan  ASA: 3  Anesthesia Plan: General   Post-op Pain Management: Minimal or no pain anticipated   Induction: Intravenous  PONV Risk Score and Plan: 2 and Propofol infusion and Treatment may vary due to age or medical condition  Airway Management Planned: Natural Airway and Nasal Cannula  Additional Equipment:   Intra-op Plan:   Post-operative Plan:   Informed Consent: I have reviewed the patients History and Physical, chart, labs and discussed the procedure including the risks, benefits and alternatives for the proposed anesthesia with the patient or authorized representative who has indicated his/her understanding and acceptance.     Dental advisory given  Plan Discussed with: CRNA  Anesthesia Plan Comments:         Anesthesia Quick Evaluation

## 2022-09-23 NOTE — Anesthesia Procedure Notes (Signed)
Procedure Name: MAC Date/Time: 09/23/2022 10:20 AM  Performed by: Barrington Ellison, CRNAPre-anesthesia Checklist: Patient identified, Emergency Drugs available, Patient being monitored, Suction available and Timeout performed Patient Re-evaluated:Patient Re-evaluated prior to induction Oxygen Delivery Method: Nasal cannula

## 2022-09-23 NOTE — Anesthesia Postprocedure Evaluation (Signed)
Anesthesia Post Note  Patient: Nathaniel [name].  Procedure(s) Performed: CARDIOVERSION TRANSESOPHAGEAL ECHOCARDIOGRAM (TEE)     Patient location during evaluation: Endoscopy Anesthesia Type: General Level of consciousness: awake and alert Pain management: pain level controlled Vital Signs Assessment: post-procedure vital signs reviewed and stable Respiratory status: spontaneous breathing, nonlabored ventilation and respiratory function stable Cardiovascular status: blood pressure returned to baseline and stable Postop Assessment: no apparent nausea or vomiting Anesthetic complications: no  No notable events documented.  Last Vitals:  Vitals:   09/23/22 1055 09/23/22 1110  BP: (!) 91/57   Pulse: 71 63  Resp: 19 (!) 22  Temp: (!) 36.3 C 36.5 C  SpO2: 97% 96%    Last Pain:  Vitals:   09/23/22 1110  TempSrc:   PainSc: 0-No pain                 Destanee Bedonie,W. EDMOND

## 2022-09-23 NOTE — Procedures (Signed)
Electrical Cardioversion Procedure Note Nathaniel [name] [id number] Jan 09, 1932  Procedure: Electrical Cardioversion Indications:  Atrial Fibrillation  Procedure Details Consent: Risks of procedure as well as the alternatives and risks of each were explained to the (patient/caregiver).  Consent for procedure obtained. Time Out: Verified patient identification, verified procedure, site/side was marked, verified correct patient position, special equipment/implants available, medications/allergies/relevent history reviewed, required imaging and test results available.  Performed  Patient placed on cardiac monitor, pulse oximetry, supplemental oxygen as necessary.  Sedation given:  Propofol per anesthesiology Pacer pads placed anterior and posterior chest.  Cardioverted 1 time(s).  Cardioverted at [place].  Evaluation Findings: Post procedure EKG shows: NSR Complications: None Patient did tolerate procedure well.  If he does not hold NSR, will need to consider AV nodal ablation/BiV pacing if he has poorly controlled AF.   Nathaniel [name] 09/23/2022, 10:51 AM

## 2022-09-23 NOTE — Interval H&P Note (Signed)
History and Physical Interval Note:  09/23/2022 10:17 AM  Nathaniel [name].  has presented today for surgery, with the diagnosis of atrial fibrillation.  The various methods of treatment have been discussed with the patient and family. After consideration of risks, benefits and other options for treatment, the patient has consented to  Procedure(s): CARDIOVERSION (N/A) TRANSESOPHAGEAL ECHOCARDIOGRAM (TEE) (N/A) as a surgical intervention.  The patient's history has been reviewed, patient examined, no change in status, stable for surgery.  I have reviewed the patient's chart and labs.  Questions were answered to the patient's satisfaction.     Aurther Harlin [employer]

## 2022-09-26 ENCOUNTER — Encounter (HOSPITAL_COMMUNITY): Payer: Self-pay | Admitting: Cardiology

## 2022-09-30 ENCOUNTER — Ambulatory Visit: Payer: Self-pay | Attending: Internal Medicine

## 2022-09-30 DIAGNOSIS — I4891 Unspecified atrial fibrillation: Secondary | ICD-10-CM

## 2022-09-30 DIAGNOSIS — Z5181 Encounter for therapeutic drug level monitoring: Secondary | ICD-10-CM

## 2022-09-30 LAB — POCT INR: INR: 1.9 — AB (ref 2.0–3.0)

## 2022-09-30 NOTE — Patient Instructions (Signed)
Description   Take 3.5 tablets today and then resume same dosage of Warfarin 3 tablets daily. Recheck INR in 2 weeks.  Stay consistent with greens each week. Coumadin Clinic [phone].

## 2022-10-06 NOTE — Progress Notes (Incomplete)
Patient ID: Nathaniel Michalowski., male   DOB: 1932/04/14, 88 y.o.   MRN: [id number] PCP: Dr. Elyse Hsu Cardiology: Dr. Aundra Dubin  88 y.o. with history of extensive vascular disease including CAD s/p CABG and redo CABG, AAA s/p endovascular repair in 2015, PAD, and carotid stenosis as well as prior AVR.                            After his AAA repair, he was noted to be in atrial fibrillation.  He had had a prior episode of atrial fibrillation after his cardiac surgery in 2007.  Atrial fibrillation was rate-controlled.  He was started on warfarin and Toprol XL.  He developed exertional dyspnea and fatigue and was cardioverted back to SR 06/06/14.  Echo 06/15 EF worsened to 25-30% with stable bioprosthetic aortic valve, mildly dilated RV with normal systolic function.  [place] 07/15 showed stable anatomy.  The LIMA-LAD was patent, the sequential SVG-OM/ramus/D was patent only to ramus but this is the same as the prior cath, and the SVG-PDA was patent with a long 50-60% mid-graft stenosis.  No intervention.  Of note, at cath he was noted to be back in atrial fibrillation.  He was started on amiodarone and had DCCV again on 08/21/14.  This was successful.  He has been off amiodarone due to side effects (hallucinations).  Echo in 1/16 showed recovery of EF to 55-60%.    He was admitted in 1/19 with bronchitis/wheezing/dyspnea and was found to be in atrial fibrillation with RVR.  He tested positive for metapneumovirus.  Echo showed EF 45-50% with normal  bioprosthetic aortic valve. He was diuresed for volume overload while in the hospital. He had DCCV to NSR.    Carotid dopplers in 1/19 showed 80-99% RICA stenosis, he had right CEA in 4/65 with no complications.   At appointment in early 6/20, Nathaniel [name] was noted to be back in atrial fibrillation with RVR and mild CHF.  He was started on Toprol XL and Lasix was increased.  He had TEE-guided DCCV to NSR in 6/20.  TEE showed EF 50%, mild diffuse hypokinesis, mildly decreased RV systolic function, normally functioning bioprosthetic aortic valve.   In 2021, he was admitted for Tikosyn initiation.  In 12/21, he went into atrial fibrillation again and was cardioverted back to NSR.   Echo in 1/22 showed EF 45-50%, diffuse hypokinesis, mildly decreased RV systolic function, moderate biatrial enlargement, bioprosthetic valve with mean gradient 8 mmHg.    In 2/22, he went into atrial fibrillation and had TEE-guided DCCV back to NSR.  TEE showed EF 45-50%.    In 3/22, he was back in the ER with atrial fibrillation triggered by [name] most likely.  He had a cardioversion back to NSR.   In 10/22, patient developed urinary retention and an E coli UTI.  In the setting of this, he had a fall at home and was unable to get up due to weakness for hours.  He developed rhabdomyolysis.  This resolved with minimal renal damage with IV fluid hydration and he was discharged to rehab.   Follow up 2/23 he was in atrial fibrillation, TEE/DCCV arranged however INR subtherapeutic. He cancelled rescheduled procedure due to a death in the family.  Follow up 3/23. Remained in AF. Volume overloaded. Lasix increased for 3 days. Underwent TEE guided DCCV on 03/19/22.  TEE showed EF 50-55%, mildly decreased RV function, normal bioprosthetic aortic valve, mild Nathaniel and mild  mitral stenosis (mean gradient 3 mmHg).    Recently diagnosed with stage IA2 NSCLC RUL. Saw Radiation Oncology and Pulmonary and decided on empiric treatment  with SBRT.  He has completed radiation.   Admitted 9/23 with NSTEMI and AF with RVR. Underwent R/LHC showing normal filling pressures, known occlusion of native coronaries and SVG-RCA from original CABG. New occlusion of SVG-PDA from CABG#2. Long 99% stenosis in the SVG-ramus. Ramus is relatively small vessel. Patent LIMA-LAD with collaterals to RCA territory. Case was discussed with Dr. Ali Lowe, wouldn't benefit from intervention, medically managed. Patient declined re-challenge of amiodarone and was able to have successful TEE/DCCV to NSR. TEE showed EF 45% with normal RV, mild-moderate Nathaniel, normal bioprosthetic aortic valve, no LA appendage thrombus. Discharged home, weight 184 lbs.  Today he returns for post hospitalization HF follow up. Overall feeling poorly. He is SOB after walking 25-30 yards but not specifically fatigued. He lives at [facility] assisted living. Denies CP, dizziness, edema, or PND/Orthopnea. Appetite ok. No fever or chills. Weight at home 186 pounds. Taking all medications. Enjoys going to the driving range and stock trading.  ECG (personally reviewed): atrial fibrillation, 116 bpm  Labs (5/15): K 4.3, creatinine 0.89 Labs (6/15): K 4.3, creatinine 1.4, BNP 112 Labs (7/15): K 4.4, creatinine 1.2, HCT 40.2 Labs (8/15): K 4, creatinine 1.2, BNP 113, LFTs and TSH normal Labs (10/15): K 4.4, creatinine 1.4 Labs (5/16): K 4.7, creatinine 1.18, HDL 52, LDL 98 Labs (6/16): LDL 118, LFTs normal Labs (1/17): HCT 40.2, K 3.7, creatinine 1.04 Labs (1/19): K 4.9, creatinine 1.19 Labs (2/19): K 4.2, creatinine 0.89 Labs (3/19): LDL 86, HDL 45, TGs 245 Labs (5/19): LDL 110, HDL 39 Labs (6/19): K 4.1, creatinine 1.07 Labs (11/19): LDL 14, HDL 56 Labs (6/20): K 4.1, creatinine 1.0 Labs (7/20): K 4.3, creatinine 1.07 Labs (9/21): LDL 129, hgb 14.8, K 4.1, creatinine 1.02 Labs (12/21): LDL 20, HDL 46, K 4.4, creatinine 1.23 Labs (1/22): LDL 45, HDL 49, K 4.3, creatinine 1.1, BNP  184 Labs (3/22): K 4.4, creatinine 1.10, LDL 15, HDL 49 Labs (6/22): K 4.3, creatinine 1.11, LDL 121 Labs (7/22): K 4.3, creatinine 1.23, hgb 14.5 Labs (11/22): K 4.5, creatinine 1.14 Labs (2/23): K 4.3, creatinine 1.16 Labs (7/23): K 4.4, creatinine 1.24 Labs (9/23): K 4.1, creatinine 1.19  PMH: 1. PAD: Peripheral arterial dopplers in 2012 showed > 75% bilateral SFA stenosis. Peripheral arterial dopplers in 10/14 showed > 50% focal bilateral SFA stenoses.  2. AAA: [place] 4/15 with 4.4 cm AAA but concern for penetrating ulcers. CTA abdomen showed 4.4 cm AAA with penetrating ulcers and possible pseudoaneurysms. Now s/p endovascular repair of AAA in 9/93 with no complications.  3. Carotid stenosis: Carotid dopplers (4/15) with 60-79% bilateral ICA stenosis. Carotid dopplers (11/15) with 60-79% bilateral ICA stenosis.  Carotid dopplers 6/16 with 60-79% bilateral ICA stenosis. - Carotid dopplers (1/18): 60-79% RICA stenosis.  - Carotid dopplers (1/19): 80-99% RICA stenosis => Right CEA in 2/19.  - Carotid dopplers (11/19): 40-59% bilateral stenosis.  - Carotid dopplers (1/21): 1-39% BICA stenosis.  4. CAD: CABG 1989 with LIMA-LAD, SVG-D, seq SVG-ramus and OM, sequential SVG-PDA/PLV. SVG-PDA/PLV found to be occluded on cath prior to AVR, so patient had SVG-PDA with AVR in 2007. LHC (7/15) with LIMA-LAD patent with 40-50% stenosis in LAD after touchdown, sequential SVG-ramus/OM/diagonal with only the ramus branch still intact (known from prior cath), SVG-PDA from original surgery TO, SVG-PDA from redo surgery with long 50-60% mid-graft stenosis.  No target  for intervention.  - LHC (9/23): known occlusion of native coronaries and SVG-RCA from original CABG, new occlusion of SVG-PDA from CABG#2, long 99% stenosis in the SVG-ramus (was sequential SVG-ramus/OM/D but OM and D branches noted to be lost on prior cath), ramus is relatively small vessel, patent LIMA-LAD with collaterals to RCA territory. Case  reviewed with Dr. Ali Lowe, probably not much benefit to intervention on SVG-ramus. Small territory covered and complex disease in SVG. Medical management advised. 5. Severe aortic stenosis: Bioprosthetic AVR in 2007.  6. Atrial fibrillation: Paroxysmal. Initially noted after cardiac surgery, then again after AAA repair.  DCCV to NSR 06/06/14.  DCCV to NSR again 08/21/14.  - DCCV to NSR in 1/19.  - DCCV to NSR in 6/20.  - Now on Tikosyn.  - DCCV to NSR 12/21.  - DCCV to NSR 2/22. - DCCV to NSR 3/22. - DCCV to NSR 4/23. - DCCV to NSR 9/23. 7. Type II diabetes  8. Gout  9. GERD  10. Hyperlipidemia  11. HTN 12. GERD  13. Chronic systolic CHF: Echo (6/96) with EF worsened to 25-30% with grade II diastolic dysfunction, stable bioprosthetic aortic valve, mildly dilated RV with normal systolic function.  Echo (1/16) with EF 55-60%, basal inferior hypokinesis, mild LVH, bioprosthetic aortic valve with mean gradient 13 mmHg, mild Nathaniel, severe LAE.  - Echo (1/19): EF 45-50%, mild Nathaniel, normal bioprosthetic aortic valve.  - TEE (6/20): EF 50% with mild diffuse hypokinesis, mildly decreased RV systolic function with mild RV enlargement, bioprosthetic aortic valve looked ok, mild Nathaniel. - Echo (1/22):  EF 45-50%, diffuse hypokinesis, mildly decreased RV systolic function, moderate biatrial enlargement, bioprosthetic valve with mean gradient 8 mmHg.   - TEE (2/22): EF 45-50%, RV normal, moderate biatrial enlargement, bioprosthetic AoV with mean gradient 6 mmHg, mild Nathaniel.  - TEE (4/23): EF 50-55%, mildly decreased RV function, normal bioprosthetic aortic valve, mild Nathaniel and mild mitral stenosis (mean gradient 3 mmHg) - TEE (9/23): EF 45%, RV normal, no LAA thrombus, no PFO/ASD, mild to moderate Nathaniel w/ moderate MAC, AoV without stenosis/regurgitation 14. BPH  15. Nonsmall cell lung cancer: Suspected by imaging, treated in 2023 with radiation.  3. Fall with rhabdomyolysis (10/22)   SH: Widower, 2 children, raised his  3 grand-daughters, lives in [place], [place].  Occasional ETOH, no smoking.   FH: Father died from ruptured AAA.   ROS: All systems reviewed and negative except as per HPI.   Current Outpatient Medications  Medication Sig Dispense Refill   atorvastatin (LIPITOR) 10 MG tablet Take 10 mg by mouth every other day. Take with 20 mg to equal 30 mg every other day     atorvastatin (LIPITOR) 20 MG tablet Take 1 tablet (20 mg total) by mouth daily. Alternating with 58m (Patient taking differently: Take 20 mg by mouth daily.) 30 tablet 5   Coenzyme Q10 (CO Q10) 200 MG CAPS Take 200 mg by mouth daily.     dofetilide (TIKOSYN) 250 MCG capsule TAKE ONE CAPSULE TWICE A DAY 60 capsule 11   dorzolamide-timolol (COSOPT) 22.3-6.8 MG/ML ophthalmic solution Place 1 drop into both eyes every evening.     ezetimibe (ZETIA) 10 MG tablet Take 1 tablet (10 mg total) by mouth at bedtime. 90 tablet 3   furosemide (LASIX) 20 MG tablet Take 20-40 mg by mouth See admin instructions. Take 40 mg in the morning and 20 mg at night     latanoprost (XALATAN) 0.005 % ophthalmic solution Place 1 drop into the  left eye at bedtime.     METAMUCIL FIBER PO Take 1 Dose by mouth 2 (two) times a week.     metoprolol succinate (TOPROL-XL) 25 MG 24 hr tablet Take 1 tablet (25 mg total) by mouth 2 (two) times daily. 180 tablet 3   Misc Natural Products (GLUCOSAMINE CHONDROITIN TRIPLE) TABS Take 1 tablet by mouth daily.     ramipril (ALTACE) 2.5 MG capsule Take 1 capsule (2.5 mg total) by mouth daily. (Patient taking differently: Take 2.5 mg by mouth 2 (two) times daily.) 90 capsule 3   REPATHA SURECLICK 450 MG/ML SOAJ INJECT 1 PEN INTO SKIN EVERY 14 DAYS 2 mL 11   spironolactone (ALDACTONE) 25 MG tablet Take 1 tablet (25 mg total) by mouth daily. 30 tablet 5   tamsulosin (FLOMAX) 0.4 MG CAPS capsule Take 1 capsule (0.4 mg total) by mouth 2 (two) times daily. 180 capsule 3   warfarin (COUMADIN) 3 MG tablet Tomorrow (9/12) take 2  tablets (43m) Starting 9/13 take 3 tablets (9106m daily or as directed by [facility] (Patient taking differently: Take 9 mg by mouth daily.) 90 tablet 2   No current facility-administered medications for this visit.   There were no vitals taken for this visit.   Wt Readings from Last 3 Encounters:  09/23/22 83.9 kg (185 lb)  09/16/22 84.4 kg (186 lb)  09/09/22 86 kg (189 lb 9.6 oz)   Physical Exam: General:  NAD. No resp difficulty, elderly, walked into clinic. HEENT: Normal Neck: Supple. No JVD. Carotids 2+ bilat; no bruits. No lymphadenopathy or thryomegaly appreciated. Cor: PMI nondisplaced. Irregular rate & rhythm. No rubs, gallops or murmurs. Lungs: Clear Abdomen: Soft, nontender, nondistended. No hepatosplenomegaly. No bruits or masses. Good bowel sounds. Extremities: No cyanosis, clubbing, rash, edema Neuro: Alert & oriented x 3, cranial nerves grossly intact. Moves all 4 extremities w/o difficulty. Affect pleasant.  Assessment/Plan:  1.  CAD: s/p CABG and Redo CABG. LHC 06/2014 showed significant coronary disease w/ no good interventional targets (LIMA-LAD patent with 40-50% stenosis in LAD after touchdown, sequential SVG-ramus/OM/diagonal with only the ramus branch still intact (known from prior cath), SVG-PDA from original surgery TO, SVG-PDA from redo surgery with long 50-60% mid-graft stenosis). NSTEMI this admission (9/23), repeat LHC showed occlusion of the 2nd SVG-PDA and 99% stenosis in the SVG-ramus.  Reviewed with Dr. ThAli Loweprobably not much benefit to intervention on SVG-ramus.  Small territory covered and complex disease in the SVG.  Plan medical management.  - Now off ASA. - Continue atorvastatin 20 mg QOD + Zetia.  - Continue Repatha. LDL excellent at 26.  2.  Chronic HF with mid-range EF: Echo this admission with stable EF 45-50%, LV global hypokinesis, RV systolic function normal. Filling pressures normal on RHC this admission (9/23). NYHA III-IIIb, he is not  volume overloaded today. Suspect worsening symptoms due to return of AF (see below). - Increase Toprol XL to 25 mg bid. - Continue Lasix 40 mg q am/20 mg qpm. BMET and BNP today. - Continue ramipril 2.5 mg daily. - Continue spironolactone 25 mg daily. - Off Jardiance with recent E coli UTI.  - Continue Toprol XL 25 mg daily. 3. Atrial fibrillation: Difficult to control, failed multiple cardioversions.  Failed amiodarone due to CNS SE, feels strongly against trying again. Offered ablation by EP but he declined. Had been primarily in NSR on Tikosyn.  In setting of NSTEMI, he developed recurrent AF. S/p TEE/DCCV with successful conversion to NSR. Unfortunately he is in  atrial fibrillation today, HR 116 bpm. We discussed re-challenge with amiodarone, he strongly declined. Discussed repeating DCCV and referring to EP/AF clinic to discuss options. Age may be prohibitive for ablation candidacy. He is agreeable for re-attempt at cardioversion. Check INR, if subtherapeutic, will need a TEE prior to cardioverson. Discussed with Dr. Aundra Dubin, will arrange for DCCV (+/- TEE) soon. - Continue Tikosyn 250 mcg bid. QTc 525 msec on ECG today. - Increase Toprol as above.  - Off ASA with warfarin. INR followed by PCP. Check INR and CBC today. 4. NSCLC RUL: Completed SBRT.  Followed by radiation oncology. Followup CT in 8/23 reviewed with pulmonary, no acute changes 5. Bioprosthetic AVR: Stable on recent echo and TEE. 6. Type 2DM: Recent A1c 6.9. - off SLGT2i given UTIs.  7. PAD:  AAA s/p EVR, LE PAD + Carotid Artery Disease s/p Rt CEA. No current claudication symptoms.  - Followed by Dr. Trula Slade. - Continue lipid lowering agents per above   Follow up with APP 2 weeks after cardioversion.  Maricela [facility] FNP-BC 10/06/2022

## 2022-10-07 ENCOUNTER — Encounter (HOSPITAL_COMMUNITY): Payer: Self-pay

## 2022-10-07 ENCOUNTER — Ambulatory Visit (HOSPITAL_COMMUNITY)
Admission: RE | Admit: 2022-10-07 | Discharge: 2022-10-07 | Disposition: A | Discharge status: Home / Self Care | Payer: Self-pay | Source: Ambulatory Visit | Attending: Family Medicine | Admitting: Family Medicine

## 2022-10-07 VITALS — BP 106/66 | HR 54 | Wt 191.0 lb

## 2022-10-07 DIAGNOSIS — E785 Hyperlipidemia, unspecified: Secondary | ICD-10-CM | POA: Insufficient documentation

## 2022-10-07 DIAGNOSIS — I110 Hypertensive heart disease with heart failure: Secondary | ICD-10-CM | POA: Insufficient documentation

## 2022-10-07 DIAGNOSIS — R351 Nocturia: Secondary | ICD-10-CM | POA: Insufficient documentation

## 2022-10-07 DIAGNOSIS — I5042 Chronic combined systolic (congestive) and diastolic (congestive) heart failure: Secondary | ICD-10-CM

## 2022-10-07 DIAGNOSIS — N400 Enlarged prostate without lower urinary tract symptoms: Secondary | ICD-10-CM | POA: Insufficient documentation

## 2022-10-07 DIAGNOSIS — Z85118 Personal history of other malignant neoplasm of bronchus and lung: Secondary | ICD-10-CM | POA: Insufficient documentation

## 2022-10-07 DIAGNOSIS — C3491 Malignant neoplasm of unspecified part of right bronchus or lung: Secondary | ICD-10-CM

## 2022-10-07 DIAGNOSIS — I2510 Atherosclerotic heart disease of native coronary artery without angina pectoris: Secondary | ICD-10-CM | POA: Insufficient documentation

## 2022-10-07 DIAGNOSIS — I480 Paroxysmal atrial fibrillation: Secondary | ICD-10-CM | POA: Insufficient documentation

## 2022-10-07 DIAGNOSIS — I739 Peripheral vascular disease, unspecified: Secondary | ICD-10-CM | POA: Insufficient documentation

## 2022-10-07 DIAGNOSIS — I214 Non-ST elevation (NSTEMI) myocardial infarction: Secondary | ICD-10-CM | POA: Insufficient documentation

## 2022-10-07 DIAGNOSIS — Z923 Personal history of irradiation: Secondary | ICD-10-CM | POA: Insufficient documentation

## 2022-10-07 DIAGNOSIS — Z952 Presence of prosthetic heart valve: Secondary | ICD-10-CM

## 2022-10-07 DIAGNOSIS — Z7901 Long term (current) use of anticoagulants: Secondary | ICD-10-CM | POA: Insufficient documentation

## 2022-10-07 DIAGNOSIS — I6523 Occlusion and stenosis of bilateral carotid arteries: Secondary | ICD-10-CM | POA: Insufficient documentation

## 2022-10-07 DIAGNOSIS — K219 Gastro-esophageal reflux disease without esophagitis: Secondary | ICD-10-CM | POA: Insufficient documentation

## 2022-10-07 DIAGNOSIS — I447 Left bundle-branch block, unspecified: Secondary | ICD-10-CM | POA: Insufficient documentation

## 2022-10-07 DIAGNOSIS — I25810 Atherosclerosis of coronary artery bypass graft(s) without angina pectoris: Secondary | ICD-10-CM | POA: Insufficient documentation

## 2022-10-07 DIAGNOSIS — Z79899 Other long term (current) drug therapy: Secondary | ICD-10-CM | POA: Insufficient documentation

## 2022-10-07 DIAGNOSIS — Z953 Presence of xenogenic heart valve: Secondary | ICD-10-CM | POA: Insufficient documentation

## 2022-10-07 DIAGNOSIS — I4891 Unspecified atrial fibrillation: Secondary | ICD-10-CM | POA: Insufficient documentation

## 2022-10-07 DIAGNOSIS — I714 Abdominal aortic aneurysm, without rupture, unspecified: Secondary | ICD-10-CM | POA: Insufficient documentation

## 2022-10-07 DIAGNOSIS — E119 Type 2 diabetes mellitus without complications: Secondary | ICD-10-CM | POA: Insufficient documentation

## 2022-10-07 DIAGNOSIS — Z8774 Personal history of (corrected) congenital malformations of heart and circulatory system: Secondary | ICD-10-CM | POA: Insufficient documentation

## 2022-10-07 DIAGNOSIS — Z951 Presence of aortocoronary bypass graft: Secondary | ICD-10-CM | POA: Insufficient documentation

## 2022-10-07 DIAGNOSIS — R001 Bradycardia, unspecified: Secondary | ICD-10-CM | POA: Insufficient documentation

## 2022-10-07 DIAGNOSIS — I5022 Chronic systolic (congestive) heart failure: Secondary | ICD-10-CM | POA: Insufficient documentation

## 2022-10-07 DIAGNOSIS — Z87440 Personal history of urinary (tract) infections: Secondary | ICD-10-CM | POA: Insufficient documentation

## 2022-10-07 LAB — BASIC METABOLIC PANEL
Anion gap: 5 (ref 5–15)
BUN: 25 mg/dL — ABNORMAL HIGH (ref 8–23)
CO2: 30 mmol/L (ref 22–32)
Calcium: 8.5 mg/dL — ABNORMAL LOW (ref 8.9–10.3)
Chloride: 103 mmol/L (ref 98–111)
Creatinine, Ser: 1.31 mg/dL — ABNORMAL HIGH (ref 0.61–1.24)
GFR, Estimated: 52 mL/min — ABNORMAL LOW (ref >60)
Glucose, Bld: 145 mg/dL — ABNORMAL HIGH (ref 70–99)
Potassium: 4.2 mmol/L (ref 3.5–5.1)
Sodium: 138 mmol/L (ref 135–145)

## 2022-10-07 LAB — BRAIN NATRIURETIC PEPTIDE: B Natriuretic Peptide: 696.6 pg/mL — ABNORMAL HIGH (ref 0.0–100.0)

## 2022-10-07 MED ORDER — METOPROLOL SUCCINATE ER 25 MG PO TB24: 25.0000 mg | ORAL_TABLET | Freq: Every day | ORAL | 3 refills | Status: DC

## 2022-10-07 NOTE — Patient Instructions (Addendum)
Great to see you today! Labwork today --we will only call with you abnormal. Decrease Metoprolol to 25 mg daily. Take afternoon dose of Lasix at 4pm. Prescription given for compression hose. Decided against this and called back to not wear compression hose. Please call our clinic in November to schedule an appt with Dr. Aundra Dubin in January or February.   At the [facility], you and your health needs are our priority. We have a designated team specialized in the treatment of Heart Failure. This Care Team includes your primary Heart Failure Specialized Cardiologist (physician), Advanced Practice Providers (APPs- Physician Assistants and Nurse Practitioners), and Pharmacist who all work together to provide you with the care you need, when you need it.   You may see any of the following providers on your designated Care Team at your next follow up:  Dr. Glori Bickers Dr. [name] Dr. Roxana Hires, NP Lyda Jester, [place] [facility], [place] Forestine Na, NP Audry Riles, PharmD   Please be sure to bring in all your medications bottles to every appointment.   Need to Contact us:  If you have any questions or concerns before your next appointment please send [place] a message through [place] or call our office at [phone].    TO LEAVE A MESSAGE FOR THE NURSE SELECT OPTION 2, PLEASE LEAVE A MESSAGE INCLUDING: YOUR NAME DATE OF BIRTH CALL BACK NUMBER REASON FOR CALL**this is important as we prioritize the call backs  YOU WILL RECEIVE A CALL BACK THE SAME DAY AS LONG AS YOU CALL BEFORE 4:00 PM

## 2022-10-07 NOTE — Addendum Note (Signed)
Encounter addended by: Rafael Bihari, FNP on: 10/07/2022 1:35 PM  Actions taken: Clinical Note Signed

## 2022-10-12 ENCOUNTER — Encounter: Payer: Self-pay | Admitting: Cardiology

## 2022-10-12 ENCOUNTER — Ambulatory Visit: Payer: Self-pay | Attending: Cardiology | Admitting: Cardiology

## 2022-10-12 VITALS — BP 110/66 | HR 107 | Ht 70.0 in | Wt 189.2 lb

## 2022-10-12 DIAGNOSIS — I4819 Other persistent atrial fibrillation: Secondary | ICD-10-CM

## 2022-10-12 DIAGNOSIS — D6869 Other thrombophilia: Secondary | ICD-10-CM

## 2022-10-12 MED ORDER — METOPROLOL SUCCINATE ER 50 MG PO TB24: 50.0000 mg | ORAL_TABLET | Freq: Every day | ORAL | 3 refills | Status: DC

## 2022-10-12 NOTE — Patient Instructions (Addendum)
Medication Instructions:  Your physician has recommended you make the following change in your medication:   ** Stop Metoprolol 25mg   ** Begin Metoprolol Succinate 50mg - 1 tablet by mouth daily  *If you need a refill on your cardiac medications before your next appointment, please call your pharmacy*   Lab Work: None ordered.  If you have labs (blood work) drawn today and your tests are completely normal, you will receive your results only by: [place] (if you have MyChart) OR A paper copy in the mail If you have any lab test that is abnormal or we need to change your treatment, we will call you to review the results.   Testing/Procedures: None ordered.    Follow-Up: At [facility], you and your health needs are our priority.  As part of our continuing mission to provide you with exceptional heart care, we have created designated Provider Care Teams.  These Care Teams include your primary Cardiologist (physician) and Advanced Practice Providers (APPs -  Physician Assistants and Nurse Practitioners) who all work together to provide you with the care you need, when you need it.  We recommend signing up for the patient portal called "MyChart".  Sign up information is provided on this After Visit Summary.  MyChart is used to connect with patients for Virtual Visits (Telemedicine).  Patients are able to view lab/test results, encounter notes, upcoming appointments, etc.  Non-urgent messages can be sent to your provider as well.   To learn more about what you can do with MyChart, go to [email or web address].    Your next appointment:   To be determined.  Dr Curt Bears will discuss with Dr Aundra Dubin  Important Information About [place]

## 2022-10-12 NOTE — Progress Notes (Signed)
Electrophysiology Office Note   Date:  10/12/2022   ID:  Muneer Leider., DOB 1932/04/24, MRN [id number]  PCP:  Marin Olp, MD  Cardiologist:  Aundra Dubin Primary Electrophysiologist:  Lue [place] Meredith Leeds, MD    Chief Complaint: AF   History of Present Illness: Abelino Tippin. is a 88 y.o. male who is being seen today for the evaluation of AF at the request of Marin Olp, MD. Presenting today for electrophysiology evaluation.  He has a history of significant coronary artery disease post CABG with redo CABG, AAA postrepair, PAD, carotid stenosis post right CEA, prior AVR.  He had endovascular AAA repair in 2015.  After his repair he was noted to be in atrial fibrillation.  He had a cardioversion in 2015.  His ejection fraction found to be 25 to 30%.  He had a stable bioprosthetic aortic valve and mildly dilated right ventricle.  He was started on amiodarone with repeat cardioversion.  He developed side effects to amiodarone.  He has since been loaded on dofetilide.  September 2023 he had non-STEMI associated with rapid atrial fibrillation.  He was found to have an occlusion 99% of SVG to ramus.  This was managed medically.  He had a TEE and cardioversion.  He unfortunately went back into atrial fibrillation and required repeat TEE and cardioversion    Today, denies symptoms of palpitations, chest pain, shortness of breath, orthopnea, PND, lower extremity edema, claudication, dizziness, presyncope, syncope, bleeding, or neurologic sequela. The patient is tolerating medications without difficulties.  Today he feels well.  He has no chest pain or shortness of breath.  Unfortunately he is in atrial fibrillation.  He was unaware of his arrhythmia.  He states that he felt poorly over the weekend, but feels fine today.  He thinks that he felt poorly due to being upset about a football game.   Past Medical History:  Diagnosis Date   AAA (abdominal aortic aneurysm) (HCC)    Atrial  fibrillation (HCC)    Atrial fibrillation (HCC)    CAD (coronary artery disease)    Cataract    CHF (congestive heart failure) (HCC)    Chronic kidney disease    Colon polyps    adenomatous   Diabetes mellitus    Diverticulosis    Dysrhythmia    A-Fib   GERD (gastroesophageal reflux disease)    Gout    Hernia, abdominal    History of aortic valve replacement with bioprosthetic valve    2007   Hx: UTI (urinary tract infection)    Hyperlipemia    Hypertension    Internal hemorrhoids    Peripheral vascular disease (HCC)    Shortness of breath    with exertion   Past Surgical History:  Procedure Laterality Date   ABDOMINAL AORTIC ENDOVASCULAR STENT GRAFT N/A 04/13/2014   Procedure: ABDOMINAL AORTIC ENDOVASCULAR STENT GRAFT;  Surgeon: Serafina Mitchell, MD;  Location: [place];  Service: Vascular;  Laterality: N/A;   ANGIOPLASTY     Unsure if he had stents put in    [place] N/A 06/06/2014   Procedure: CARDIOVERSION;  Surgeon: Larey Dresser, MD;  Location: [place];  Service: Cardiovascular;  Laterality: N/A;   CARDIOVERSION N/A 08/21/2014   Procedure: CARDIOVERSION;  Surgeon: Larey Dresser, MD;  Location: [place];  Service: Cardiovascular;  Laterality: N/A;   CARDIOVERSION N/A 12/22/2017   Procedure: CARDIOVERSION;  Surgeon: Aundra Dubin,  Elby Showers, MD;  Location: [place];  Service: Cardiovascular;  Laterality: N/A;   CARDIOVERSION N/A 05/25/2019   Procedure: CARDIOVERSION;  Surgeon: Larey Dresser, MD;  Location: [place];  Service: Cardiovascular;  Laterality: N/A;   CARDIOVERSION N/A 06/14/2019   Procedure: CARDIOVERSION;  Surgeon: Jerline Pain, MD;  Location: [facility] ENDOSCOPY;  Service: Cardiovascular;  Laterality: N/A;   CARDIOVERSION N/A 12/12/2020   Procedure: CARDIOVERSION;  Surgeon: Larey Dresser, MD;  Location: [place];  Service: Cardiovascular;  Laterality: N/A;   CARDIOVERSION N/A 01/29/2021   Procedure:  CARDIOVERSION;  Surgeon: Larey Dresser, MD;  Location: [place];  Service: Cardiovascular;  Laterality: N/A;   CARDIOVERSION N/A 03/19/2022   Procedure: CARDIOVERSION;  Surgeon: Larey Dresser, MD;  Location: [place];  Service: Cardiovascular;  Laterality: N/A;   CARDIOVERSION N/A 08/24/2022   Procedure: CARDIOVERSION;  Surgeon: Larey Dresser, MD;  Location: [facility] ENDOSCOPY;  Service: Cardiovascular;  Laterality: N/A;   CARDIOVERSION N/A 09/23/2022   Procedure: CARDIOVERSION;  Surgeon: Larey Dresser, MD;  Location: [place];  Service: Cardiovascular;  Laterality: N/A;   CORONARY ARTERY BYPASS GRAFT     ENDARTERECTOMY Right 02/02/2018   Procedure: ENDARTERECTOMY CAROTID RIGHT;  Surgeon: Serafina Mitchell, MD;  Location: [facility] OR;  Service: Vascular;  Laterality: Right;   EYE SURGERY Bilateral    cataract surgery   HERNIA REPAIR     2 operations   LEFT HEART CATHETERIZATION WITH CORONARY/GRAFT ANGIOGRAM N/A 07/10/2014   Procedure: LEFT HEART CATHETERIZATION WITH Beatrix Fetters;  Surgeon: Larey Dresser, MD;  Location: [facility] CATH LAB;  Service: Cardiovascular;  Laterality: N/A;   RIGHT HEART CATH AND CORONARY/GRAFT ANGIOGRAPHY N/A 08/21/2022   Procedure: RIGHT HEART CATH AND CORONARY/GRAFT ANGIOGRAPHY;  Surgeon: Larey Dresser, MD;  Location: [place] CV LAB;  Service: Cardiovascular;  Laterality: N/A;   TEE WITHOUT CARDIOVERSION N/A 05/25/2019   Procedure: TRANSESOPHAGEAL ECHOCARDIOGRAM (TEE);  Surgeon: Larey Dresser, MD;  Location: [facility] ENDOSCOPY;  Service: Cardiovascular;  Laterality: N/A;   TEE WITHOUT CARDIOVERSION N/A 01/29/2021   Procedure: TRANSESOPHAGEAL ECHOCARDIOGRAM (TEE);  Surgeon: Larey Dresser, MD;  Location: [facility] ENDOSCOPY;  Service: Cardiovascular;  Laterality: N/A;   TEE WITHOUT CARDIOVERSION N/A 03/19/2022   Procedure: TRANSESOPHAGEAL ECHOCARDIOGRAM (TEE);  Surgeon: Larey Dresser, MD;  Location: [facility] ENDOSCOPY;  Service: Cardiovascular;  Laterality: N/A;    TEE WITHOUT CARDIOVERSION N/A 08/24/2022   Procedure: TRANSESOPHAGEAL ECHOCARDIOGRAM (TEE);  Surgeon: Larey Dresser, MD;  Location: [facility] ENDOSCOPY;  Service: Cardiovascular;  Laterality: N/A;   TEE WITHOUT CARDIOVERSION N/A 09/23/2022   Procedure: TRANSESOPHAGEAL ECHOCARDIOGRAM (TEE);  Surgeon: Larey Dresser, MD;  Location: [facility] ENDOSCOPY;  Service: Cardiovascular;  Laterality: N/A;   TONSILLECTOMY     VIDEO BRONCHOSCOPY Bilateral 12/25/2015   Procedure: VIDEO BRONCHOSCOPY WITHOUT FLUORO;  Surgeon: Javier Glazier, MD;  Location: [place];  Service: Cardiopulmonary;  Laterality: Bilateral;     Current Outpatient Medications  Medication Sig Dispense Refill   atorvastatin (LIPITOR) 10 MG tablet Take 10 mg by mouth every other day. Take with 20 mg to equal 30 mg every other day     atorvastatin (LIPITOR) 20 MG tablet Take 1 tablet (20 mg total) by mouth daily. Alternating with 30mg  (Patient taking differently: Take 20 mg by mouth daily.) 30 tablet 5   Coenzyme Q10 (CO Q10) 200 MG CAPS Take 200 mg by mouth daily.     dofetilide (TIKOSYN) 250 MCG capsule TAKE ONE CAPSULE TWICE A DAY 60 capsule  11   dorzolamide-timolol (COSOPT) 22.3-6.8 MG/ML ophthalmic solution Place 1 drop into both eyes every evening.     ezetimibe (ZETIA) 10 MG tablet Take 1 tablet (10 mg total) by mouth at bedtime. 90 tablet 3   furosemide (LASIX) 20 MG tablet Take 20-40 mg by mouth See admin instructions. Take 40 mg in the morning and 20 mg in afternoon -4pm.     latanoprost (XALATAN) 0.005 % ophthalmic solution Place 1 drop into the left eye at bedtime.     METAMUCIL FIBER PO Take 1 Dose by mouth 2 (two) times a week.     metoprolol succinate (TOPROL-XL) 50 MG 24 hr tablet Take 1 tablet (50 mg total) by mouth daily. Take with or immediately following a meal. 90 tablet 3   Misc Natural Products (GLUCOSAMINE CHONDROITIN TRIPLE) TABS Take 1 tablet by mouth daily.     ramipril (ALTACE) 2.5 MG capsule Take 1 capsule (2.5  mg total) by mouth daily. (Patient taking differently: Take 2.5 mg by mouth 2 (two) times daily.) 90 capsule 3   REPATHA SURECLICK 810 MG/ML SOAJ INJECT 1 PEN INTO SKIN EVERY 14 DAYS 2 mL 11   spironolactone (ALDACTONE) 25 MG tablet Take 1 tablet (25 mg total) by mouth daily. 30 tablet 5   tamsulosin (FLOMAX) 0.4 MG CAPS capsule Take 1 capsule (0.4 mg total) by mouth 2 (two) times daily. 180 capsule 3   warfarin (COUMADIN) 3 MG tablet Tomorrow (9/12) take 2 tablets (6mg ) Starting 9/13 take 3 tablets (9mg ) daily or as directed by Coumadin Clinic (Patient taking differently: Take 9 mg by mouth daily.) 90 tablet 2   No current facility-administered medications for this visit.    Allergies:   Barbiturates, Grass extracts [gramineae pollens], Milk-related compounds, and Amiodarone   Social History:  The patient  reports that he quit smoking about 30 years ago. His smoking use included cigarettes. He has a 5.00 pack-year smoking history. He has been exposed to tobacco smoke. He has never used smokeless tobacco. He reports current alcohol use of about 3.0 standard drinks of alcohol per week. He reports that he does not use drugs.   Family History:  The patient's family history includes AAA (abdominal aortic aneurysm) in his father; Heart disease in his father and mother.   ROS:  Please see the history of present illness.   Otherwise, review of systems is positive for none.   All other systems are reviewed and negative.   PHYSICAL EXAM: VS:  BP 110/66   Pulse (!) 107   Ht 5\' 10"  (1.778 m)   Wt 189 lb 3.2 oz (85.8 kg)   SpO2 95%   BMI 27.15 kg/m  , BMI Body mass index is 27.15 kg/m. GEN: Well nourished, well developed, in no acute distress  HEENT: normal  Neck: no JVD, carotid bruits, or masses Cardiac: irregular; no murmurs, rubs, or gallops,no edema  Respiratory:  clear to auscultation bilaterally, normal work of breathing GI: soft, nontender, nondistended, + BS MS: no deformity or atrophy   Skin: warm and dry Neuro:  Strength and sensation are intact Psych: euthymic mood, full affect  EKG:  EKG is ordered today. Personal review of the ekg ordered  shows atrial fibrillation, rate 124  Recent Labs: 08/20/2022: ALT 18 08/24/2022: Magnesium 2.1 09/09/2022: Hemoglobin 12.7; Platelets 202 10/07/2022: B Natriuretic Peptide 696.6; BUN 25; Creatinine, Ser 1.31; Potassium 4.2; Sodium 138    Lipid Panel     Component Value Date/Time   CHOL 81  08/22/2022 0254   CHOL 96 (L) 11/09/2018 0913   TRIG 97 08/22/2022 0254   TRIG 113 05/08/2009 0000   HDL 36 (L) 08/22/2022 0254   HDL 56 11/09/2018 0913   CHOLHDL 2.3 08/22/2022 0254   VLDL 19 08/22/2022 0254   LDLCALC 26 08/22/2022 0254   LDLCALC 14 11/09/2018 0913   LDLDIRECT 98 05/08/2009 0000     Wt Readings from Last 3 Encounters:  10/12/22 189 lb 3.2 oz (85.8 kg)  10/07/22 191 lb (86.6 kg)  09/23/22 185 lb (83.9 kg)      Other studies Reviewed: Additional studies/ records that were reviewed today include: TTE 01/01/21  Review of the above records today demonstrates:   1. Left ventricular ejection fraction, by estimation, is 45 to 50%. The  left ventricle has mildly decreased function. The left ventricle  demonstrates global hypokinesis. Left ventricular diastolic parameters are  indeterminate.   2. Right ventricular systolic function is normal. The right ventricular  size is normal. There is normal pulmonary artery systolic pressure. The  estimated right ventricular systolic pressure is 78.2 mmHg.   3. Right atrial size was moderately dilated.   4. Left atrial size was moderately dilated.   5. The mitral valve is normal in structure. Mild mitral valve  regurgitation. No evidence of mitral stenosis. Moderate mitral annular  calcification.   6. Bioprosthetic aortic valve with mean gradient 8 mmHg (no significant  stenosis). No significant regurgitation.   7. Aortic dilatation noted. There is mild dilatation of the  ascending  aorta, measuring 37 mm.   8. The inferior vena cava is dilated in size with >50% respiratory  variability, suggesting right atrial pressure of 8 mmHg.   9. The patient was in atrial fibrillation.    ASSESSMENT AND PLAN:  1.  Persistent atrial fibrillation: Had side effects on amiodarone.  Currently on Tikosyn 250 mcg twice daily, warfarin.  CHA2DS2-VASc of 6.  He has had multiple cardioversions over the last few weeks.  He is unfortunately back in atrial fibrillation currently.  His heart rate was slow in sinus rhythm and thus his metoprolol was recently decreased.  He is minimally symptomatic today.  I think that he is likely failing dofetilide.  At this point, he is not a good candidate for ablation.  I think a rate control strategy would be reasonable.  We Tearah Saulsbury increase his Toprol-XL back to 50 mg.  I Tegh Franek discuss this further with his primary cardiologist.  2.  Hypertension: Currently well controlled  3.  Coronary disease: Status post CABG with repeat CABG.  No current chest pain.  Plan per primary cardiology.  4.  Peripheral arterial disease: Followed by vascular surgery  5.  Chronic systolic heart failure: Most likely ischemic in nature.  Currently on optimal medical therapy per heart failure cardiology.   Current medicines are reviewed at length with the patient today.   The patient does not have concerns regarding his medicines.  The following changes were made today: Increase metoprolol  Labs/ tests ordered today include:  Orders Placed This Encounter  Procedures   EKG 12-Lead     Disposition:   FU pending discussion on rhythm controlmonths  Signed, Anzel Kearse Meredith Leeds, MD  10/12/2022 11:33 AM     [facility] [address] [place] [address] [phone] (office) [phone] (fax)

## 2022-10-14 ENCOUNTER — Ambulatory Visit: Payer: Self-pay | Attending: Cardiology | Admitting: *Deleted

## 2022-10-14 DIAGNOSIS — Z5181 Encounter for therapeutic drug level monitoring: Secondary | ICD-10-CM

## 2022-10-14 DIAGNOSIS — I4891 Unspecified atrial fibrillation: Secondary | ICD-10-CM

## 2022-10-14 LAB — POCT INR: INR: 3.9 — AB (ref 2.0–3.0)

## 2022-10-14 NOTE — Patient Instructions (Signed)
Description   Do not take any warfarin today then start taking Warfarin 3 tablets daily except 2 tablets on Sunday. Recheck INR in 2 weeks. Stay consistent with greens each week. [facility] [phone].

## 2022-10-19 NOTE — Progress Notes (Incomplete)
Patient ID: Nathaniel [name]., male   DOB: Dec 19, 1931, 88 y.o.   MRN: [id number] PCP: Nathaniel [name] Cardiology: Nathaniel [name]  88 y.o. with history of extensive vascular disease including CAD s/p CABG and redo CABG, AAA s/p endovascular repair in 2015, PAD, and carotid stenosis as well as prior AVR.                            After his AAA repair, he was noted to be in atrial fibrillation.  He had had a prior episode of atrial fibrillation after his cardiac surgery in 2007.  Atrial fibrillation was rate-controlled.  He was started on warfarin and Toprol XL.  He developed exertional dyspnea and fatigue and was cardioverted back to SR 06/06/14.  Echo 06/15 EF worsened to 25-30% with stable bioprosthetic aortic valve, mildly dilated RV with normal systolic function.  [place] 07/15 showed stable anatomy.  The LIMA-LAD was patent, the sequential SVG-OM/ramus/D was patent only to ramus but this is the same as the prior cath, and the SVG-PDA was patent with a long 50-60% mid-graft stenosis.  No intervention.  Of note, at cath he was noted to be back in atrial fibrillation.  He was started on amiodarone and had DCCV again on 08/21/14.  This was successful.  He has been off amiodarone due to side effects (hallucinations).  Echo in 1/16 showed recovery of EF to 55-60%.    He was admitted in 1/19 with bronchitis/wheezing/dyspnea and was found to be in atrial fibrillation with RVR.  He tested positive for metapneumovirus.  Echo showed EF 45-50% with normal  bioprosthetic aortic valve. He was diuresed for volume overload while in the hospital. He had DCCV to NSR.    Carotid dopplers in 1/19 showed 80-99% RICA stenosis, he had right CEA in 4/31 with no complications.   At appointment in early 6/20, Nathaniel [name] was noted to be back in atrial fibrillation with RVR and mild CHF.  He was started on Toprol XL and Lasix was increased.  He had TEE-guided DCCV to NSR in 6/20.  TEE showed EF 50%, mild diffuse hypokinesis, mildly decreased RV systolic function, normally functioning bioprosthetic aortic valve.   In 2021, he was admitted for Tikosyn initiation.  In 12/21, he went into atrial fibrillation again and was cardioverted back to NSR.   Echo in 1/22 showed EF 45-50%, diffuse hypokinesis, mildly decreased RV systolic function, moderate biatrial enlargement, bioprosthetic valve with mean gradient 8 mmHg.    In 2/22, he went into atrial fibrillation and had TEE-guided DCCV back to NSR.  TEE showed EF 45-50%.    In 3/22, he was back in the ER with atrial fibrillation triggered by [name] most likely.  He had a cardioversion back to NSR.   In 10/22, patient developed urinary retention and an E coli UTI.  In the setting of this, he had a fall at home and was unable to get up due to weakness for hours.  He developed rhabdomyolysis.  This resolved with minimal renal damage with IV fluid hydration and he was discharged to rehab.   Follow up 2/23 he was in atrial fibrillation, TEE/DCCV arranged however INR subtherapeutic. He cancelled rescheduled procedure due to a death in the family.  Follow up 3/23. Remained in AF. Volume overloaded. Lasix increased for 3 days. Underwent TEE guided DCCV on 03/19/22.  TEE showed EF 50-55%, mildly decreased RV function, normal bioprosthetic aortic valve, mild Nathaniel and mild  mitral stenosis (mean gradient 3 mmHg).    Recently diagnosed with stage IA2 NSCLC RUL. Saw Radiation Oncology and Pulmonary and decided on empiric treatment  with SBRT.  He has completed radiation.   Admitted 9/23 with NSTEMI and AF with RVR. Underwent R/LHC showing normal filling pressures, known occlusion of native coronaries and SVG-RCA from original CABG. New occlusion of SVG-PDA from CABG#2. Long 99% stenosis in the SVG-ramus. Ramus is relatively small vessel. Patent LIMA-LAD with collaterals to RCA territory. Case was discussed with Nathaniel [name], wouldn't benefit from intervention, medically managed. Patient declined re-challenge of amiodarone and was able to have successful TEE/DCCV to NSR. TEE showed EF 45% with normal RV, mild-moderate Nathaniel, normal bioprosthetic aortic valve, no LA appendage thrombus. Discharged home, weight 184 lbs.  Follow up 9/23, back in atrial fibrillation with HR 116. Toprol increased to 25 bid and arranged for TEE/DCCV.  TEE/DCCV (10/23) showed EF 45%, diffuse HK, RV mildly decreased, moderate Nathaniel, Bioprosthetic AoV stable with mean gradient 6 mmHg, no LAA. Proceeded with successful TEE to NSR.  Today he returns for post TEE/DCCV follow up. Overall feeling fine. He has dyspnea with walking, this waxes and wanes. Walks around [employer]. He is not SOB with ADLs. Continues to have mild ankle swelling. Denies palpitations, CP, dizziness, or PND/Orthopnea. Appetite ok. No fever or chills. Weight at home 187 pounds. Taking all medications. Enjoys going to the driving range and stock trading.  ECG (personally reviewed): sinus bradycardia, LBBB, QTc 473 msec 44 bpm  Labs (5/15): K 4.3, creatinine 0.89 Labs (6/15): K 4.3, creatinine 1.4, BNP 112 Labs (7/15): K 4.4, creatinine 1.2, HCT 40.2 Labs (8/15): K 4, creatinine 1.2, BNP 113, LFTs and TSH normal Labs (10/15): K 4.4, creatinine 1.4 Labs (5/16): K 4.7, creatinine 1.18, HDL 52, LDL 98 Labs (6/16): LDL 118, LFTs normal Labs (1/17): HCT 40.2, K 3.7, creatinine 1.04 Labs (1/19): K 4.9, creatinine 1.19 Labs (2/19): K 4.2, creatinine 0.89 Labs (3/19): LDL 86, HDL 45, TGs  245 Labs (5/19): LDL 110, HDL 39 Labs (6/19): K 4.1, creatinine 1.07 Labs (11/19): LDL 14, HDL 56 Labs (6/20): K 4.1, creatinine 1.0 Labs (7/20): K 4.3, creatinine 1.07 Labs (9/21): LDL 129, hgb 14.8, K 4.1, creatinine 1.02 Labs (12/21): LDL 20, HDL 46, K 4.4, creatinine 1.23 Labs (1/22): LDL 45, HDL 49, K 4.3, creatinine 1.1, BNP 184 Labs (3/22): K 4.4, creatinine 1.10, LDL 15, HDL 49 Labs (6/22): K 4.3, creatinine 1.11, LDL 121 Labs (7/22): K 4.3, creatinine 1.23, hgb 14.5 Labs (11/22): K 4.5, creatinine 1.14 Labs (2/23): K 4.3, creatinine 1.16 Labs (7/23): K 4.4, creatinine 1.24 Labs (9/23): K 4.1, creatinine 1.19  PMH: 1. PAD: Peripheral arterial dopplers in 2012 showed > 75% bilateral SFA stenosis. Peripheral arterial dopplers in 10/14 showed > 50% focal bilateral SFA stenoses.  2. AAA: [place] 4/15 with 4.4 cm AAA but concern for penetrating ulcers. CTA abdomen showed 4.4 cm AAA with penetrating ulcers and possible pseudoaneurysms. Now s/p endovascular repair of AAA in 7/12 with no complications.  3. Carotid stenosis: Carotid dopplers (4/15) with 60-79% bilateral ICA stenosis. Carotid dopplers (11/15) with 60-79% bilateral ICA stenosis.  Carotid dopplers 6/16 with 60-79% bilateral ICA stenosis. - Carotid dopplers (1/18): 60-79% RICA stenosis.  - Carotid dopplers (1/19): 80-99% RICA stenosis => Right CEA in 2/19.  - Carotid dopplers (11/19): 40-59% bilateral stenosis.  - Carotid dopplers (1/21): 1-39% BICA stenosis.  4. CAD: CABG 1989 with LIMA-LAD, SVG-D, seq SVG-ramus and OM, sequential SVG-PDA/PLV. SVG-PDA/PLV  found to be occluded on cath prior to AVR, so patient had SVG-PDA with AVR in 2007. LHC (7/15) with LIMA-LAD patent with 40-50% stenosis in LAD after touchdown, sequential SVG-ramus/OM/diagonal with only the ramus branch still intact (known from prior cath), SVG-PDA from original surgery TO, SVG-PDA from redo surgery with long 50-60% mid-graft stenosis.  No target for intervention.   - LHC (9/23): known occlusion of native coronaries and SVG-RCA from original CABG, new occlusion of SVG-PDA from CABG#2, long 99% stenosis in the SVG-ramus (was sequential SVG-ramus/OM/D but OM and D branches noted to be lost on prior cath), ramus is relatively small vessel, patent LIMA-LAD with collaterals to RCA territory. Case reviewed with Nathaniel [name], probably not much benefit to intervention on SVG-ramus. Small territory covered and complex disease in SVG. Medical management advised. 5. Severe aortic stenosis: Bioprosthetic AVR in 2007.  6. Atrial fibrillation: Paroxysmal. Initially noted after cardiac surgery, then again after AAA repair.  DCCV to NSR 06/06/14.  DCCV to NSR again 08/21/14.  - DCCV to NSR in 1/19.  - DCCV to NSR in 6/20.  - Now on Tikosyn.  - DCCV to NSR 12/21.  - DCCV to NSR 2/22. - DCCV to NSR 3/22. - DCCV to NSR 4/23. - DCCV to NSR 9/23. - DCCV to NSR 10/23 7. Type II diabetes  8. Gout  9. GERD  10. Hyperlipidemia  11. HTN 12. GERD  13. Chronic systolic CHF: Echo (4/65) with EF worsened to 25-30% with grade II diastolic dysfunction, stable bioprosthetic aortic valve, mildly dilated RV with normal systolic function.  Echo (1/16) with EF 55-60%, basal inferior hypokinesis, mild LVH, bioprosthetic aortic valve with mean gradient 13 mmHg, mild Nathaniel, severe LAE.  - Echo (1/19): EF 45-50%, mild Nathaniel, normal bioprosthetic aortic valve.  - TEE (6/20): EF 50% with mild diffuse hypokinesis, mildly decreased RV systolic function with mild RV enlargement, bioprosthetic aortic valve looked ok, mild Nathaniel. - Echo (1/22):  EF 45-50%, diffuse hypokinesis, mildly decreased RV systolic function, moderate biatrial enlargement, bioprosthetic valve with mean gradient 8 mmHg.   - TEE (2/22): EF 45-50%, RV normal, moderate biatrial enlargement, bioprosthetic AoV with mean gradient 6 mmHg, mild Nathaniel.  - TEE (4/23): EF 50-55%, mildly decreased RV function, normal bioprosthetic aortic valve, mild Nathaniel  and mild mitral stenosis (mean gradient 3 mmHg) - TEE (9/23): EF 45%, RV normal, no LAA thrombus, no PFO/ASD, mild to moderate Nathaniel w/ moderate MAC, AoV without stenosis/regurgitation - TEE (10/23): EF 45%, RV mildly decreased, no LAA thrombus, no PFO/ASD, moderate Nathaniel, AoV without stenosis/regurgitation, mean gradient 6 mmHg. 14. BPH  15. Nonsmall cell lung cancer: Suspected by imaging, treated in 2023 with radiation.  107. Fall with rhabdomyolysis (10/22)   SH: Widower, 2 children, raised his 3 grand-daughters, lives in [place], [place].  Occasional ETOH, no smoking.   FH: Father died from ruptured AAA.   ROS: All systems reviewed and negative except as per HPI.   Current Outpatient Medications  Medication Sig Dispense Refill   atorvastatin (LIPITOR) 10 MG tablet Take 10 mg by mouth every other day. Take with 20 mg to equal 30 mg every other day     atorvastatin (LIPITOR) 20 MG tablet Take 1 tablet (20 mg total) by mouth daily. Alternating with 63m (Patient taking differently: Take 20 mg by mouth daily.) 30 tablet 5   Coenzyme Q10 (CO Q10) 200 MG CAPS Take 200 mg by mouth daily.     dofetilide (TIKOSYN) 250 MCG capsule TAKE ONE  CAPSULE TWICE A DAY 60 capsule 11   dorzolamide-timolol (COSOPT) 22.3-6.8 MG/ML ophthalmic solution Place 1 drop into both eyes every evening.     ezetimibe (ZETIA) 10 MG tablet Take 1 tablet (10 mg total) by mouth at bedtime. 90 tablet 3   furosemide (LASIX) 20 MG tablet Take 20-40 mg by mouth See admin instructions. Take 40 mg in the morning and 20 mg in afternoon -4pm.     latanoprost (XALATAN) 0.005 % ophthalmic solution Place 1 drop into the left eye at bedtime.     METAMUCIL FIBER PO Take 1 Dose by mouth 2 (two) times a week.     metoprolol succinate (TOPROL-XL) 50 MG 24 hr tablet Take 1 tablet (50 mg total) by mouth daily. Take with or immediately following a meal. 90 tablet 3   Misc Natural Products (GLUCOSAMINE CHONDROITIN TRIPLE) TABS Take 1 tablet by  mouth daily.     ramipril (ALTACE) 2.5 MG capsule Take 1 capsule (2.5 mg total) by mouth daily. (Patient taking differently: Take 2.5 mg by mouth 2 (two) times daily.) 90 capsule 3   REPATHA SURECLICK 621 MG/ML SOAJ INJECT 1 PEN INTO SKIN EVERY 14 DAYS 2 mL 11   spironolactone (ALDACTONE) 25 MG tablet Take 1 tablet (25 mg total) by mouth daily. 30 tablet 5   tamsulosin (FLOMAX) 0.4 MG CAPS capsule Take 1 capsule (0.4 mg total) by mouth 2 (two) times daily. 180 capsule 3   warfarin (COUMADIN) 3 MG tablet Tomorrow (9/12) take 2 tablets (80m) Starting 9/13 take 3 tablets (937m daily or as directed by Coumadin Clinic (Patient taking differently: Take 9 mg by mouth daily.) 90 tablet 2   No current facility-administered medications for this visit.   There were no vitals taken for this visit.   Wt Readings from Last 3 Encounters:  10/12/22 85.8 kg (189 lb 3.2 oz)  10/07/22 86.6 kg (191 lb)  09/23/22 83.9 kg (185 lb)   Physical Exam: General:  NAD. No resp difficulty, elderly, walked into clinic HEENT: Normal Neck: Supple. No JVD. Carotids 2+ bilat; no bruits. No lymphadenopathy or thryomegaly appreciated. Cor: PMI nondisplaced. Brady regular rate & rhythm. No rubs, gallops, 2/6 SEM RUSB Lungs: Clear Abdomen: Soft, nontender, nondistended. No hepatosplenomegaly. No bruits or masses. Good bowel sounds. Extremities: No cyanosis, clubbing, rash, trace pedal edema Neuro: Alert & oriented x 3, cranial nerves grossly intact. Moves all 4 extremities w/o difficulty. Affect pleasant.  Assessment/Plan:  1.  CAD: s/p CABG and Redo CABG. LHC 06/2014 showed significant coronary disease w/ no good interventional targets (LIMA-LAD patent with 40-50% stenosis in LAD after touchdown, sequential SVG-ramus/OM/diagonal with only the ramus branch still intact (known from prior cath), SVG-PDA from original surgery TO, SVG-PDA from redo surgery with long 50-60% mid-graft stenosis). NSTEMI this admission (9/23), repeat  LHC showed occlusion of the 2nd SVG-PDA and 99% stenosis in the SVG-ramus.  Reviewed with Dr. ThAli Loweprobably not much benefit to intervention on SVG-ramus.  Small territory covered and complex disease in the SVG.  Plan medical management.  - Now off ASA. - Continue atorvastatin 20 mg QOD + Zetia.  - Continue Repatha. LDL excellent at 26.  2.  Chronic HF with mid-range EF: Echo this admission with stable EF 45-50%, LV global hypokinesis, RV systolic function normal. Filling pressures normal on RHC this admission (9/23). NYHA II-early III, he is not volume overloaded today.  - With bradycardia, reduce Toprol back to 25 mg daily. - Continue Lasix 40 mg q am/20  mg qpm. Discussed taking 2nd dose of lasix at 4 pm to minimize nocturia. BMET and BNP today. - Continue ramipril 2.5 mg bid.  - Continue spironolactone 25 mg daily. - Off Jardiance with recent E coli UTI.  - Avoid compression hose with PAD, elevate legs to help with pedal edema. 3. Atrial fibrillation: Difficult to control, failed multiple cardioversions.  Failed amiodarone due to CNS SE, feels strongly against trying again. Offered ablation by EP but he declined. Had been primarily in NSR on Tikosyn.  In setting of NSTEMI, he developed recurrent AF. S/p TEE/DCCV 9//23 and 10/23 with successful conversion to NSR. He is SB on ECG today. - Continue Tikosyn 250 mcg bid. QTc 473 msec on ECG today. - Toprol as above.  - Off ASA with warfarin. INR followed by PCP.  4. NSCLC RUL: Completed SBRT.  Followed by radiation oncology. Followup CT in 8/23 reviewed with pulmonary, no acute changes 5. Bioprosthetic AVR: Stable on recent echo and TEE. 6. Type 2DM: Recent A1c 6.9. - off SLGT2i given UTIs.  7. PAD:  AAA s/p EVR, LE PAD + Carotid Artery Disease s/p Rt CEA. No current claudication symptoms.  - Followed by Dr. Trula Slade. - Continue lipid lowering agents per above.   Follow up in 3 months with Nathaniel [name].  Maricela Bo [facility] FNP-BC 10/19/2022

## 2022-10-20 ENCOUNTER — Ambulatory Visit (HOSPITAL_COMMUNITY)
Admission: RE | Admit: 2022-10-20 | Discharge: 2022-10-20 | Disposition: A | Discharge status: Home / Self Care | Payer: Self-pay | Source: Ambulatory Visit | Attending: Family Medicine | Admitting: Family Medicine

## 2022-10-20 ENCOUNTER — Encounter (HOSPITAL_COMMUNITY): Payer: Self-pay

## 2022-10-20 VITALS — BP 116/70 | HR 108 | Wt 187.6 lb

## 2022-10-20 DIAGNOSIS — I252 Old myocardial infarction: Secondary | ICD-10-CM | POA: Insufficient documentation

## 2022-10-20 DIAGNOSIS — I714 Abdominal aortic aneurysm, without rupture, unspecified: Secondary | ICD-10-CM | POA: Insufficient documentation

## 2022-10-20 DIAGNOSIS — I5032 Chronic diastolic (congestive) heart failure: Secondary | ICD-10-CM

## 2022-10-20 DIAGNOSIS — I2510 Atherosclerotic heart disease of native coronary artery without angina pectoris: Secondary | ICD-10-CM

## 2022-10-20 DIAGNOSIS — C3411 Malignant neoplasm of upper lobe, right bronchus or lung: Secondary | ICD-10-CM | POA: Insufficient documentation

## 2022-10-20 DIAGNOSIS — I110 Hypertensive heart disease with heart failure: Secondary | ICD-10-CM | POA: Insufficient documentation

## 2022-10-20 DIAGNOSIS — E119 Type 2 diabetes mellitus without complications: Secondary | ICD-10-CM

## 2022-10-20 DIAGNOSIS — Z923 Personal history of irradiation: Secondary | ICD-10-CM | POA: Insufficient documentation

## 2022-10-20 DIAGNOSIS — Z7901 Long term (current) use of anticoagulants: Secondary | ICD-10-CM | POA: Insufficient documentation

## 2022-10-20 DIAGNOSIS — Z79899 Other long term (current) drug therapy: Secondary | ICD-10-CM | POA: Insufficient documentation

## 2022-10-20 DIAGNOSIS — I4891 Unspecified atrial fibrillation: Secondary | ICD-10-CM | POA: Insufficient documentation

## 2022-10-20 DIAGNOSIS — Z951 Presence of aortocoronary bypass graft: Secondary | ICD-10-CM | POA: Insufficient documentation

## 2022-10-20 DIAGNOSIS — I739 Peripheral vascular disease, unspecified: Secondary | ICD-10-CM

## 2022-10-20 DIAGNOSIS — Z952 Presence of prosthetic heart valve: Secondary | ICD-10-CM

## 2022-10-20 DIAGNOSIS — C3491 Malignant neoplasm of unspecified part of right bronchus or lung: Secondary | ICD-10-CM

## 2022-10-20 DIAGNOSIS — Z953 Presence of xenogenic heart valve: Secondary | ICD-10-CM | POA: Insufficient documentation

## 2022-10-20 DIAGNOSIS — I4819 Other persistent atrial fibrillation: Secondary | ICD-10-CM

## 2022-10-20 DIAGNOSIS — I25810 Atherosclerosis of coronary artery bypass graft(s) without angina pectoris: Secondary | ICD-10-CM | POA: Insufficient documentation

## 2022-10-20 LAB — BASIC METABOLIC PANEL
Anion gap: 9 (ref 5–15)
BUN: 23 mg/dL (ref 8–23)
CO2: 25 mmol/L (ref 22–32)
Calcium: 8.9 mg/dL (ref 8.9–10.3)
Chloride: 104 mmol/L (ref 98–111)
Creatinine, Ser: 1.21 mg/dL (ref 0.61–1.24)
GFR, Estimated: 58 mL/min — ABNORMAL LOW (ref >60)
Glucose, Bld: 144 mg/dL — ABNORMAL HIGH (ref 70–99)
Potassium: 4.2 mmol/L (ref 3.5–5.1)
Sodium: 138 mmol/L (ref 135–145)

## 2022-10-20 LAB — ECHO TEE
AV Mean grad: 5 mmHg
AV Peak grad: 11 mmHg
Ao pk vel: 1.66 m/s
MV M vel: 4.2 m/s
MV Peak grad: 70.6 mmHg
Radius: 0.6 cm

## 2022-10-20 LAB — BRAIN NATRIURETIC PEPTIDE: B Natriuretic Peptide: 303.8 pg/mL — ABNORMAL HIGH (ref 0.0–100.0)

## 2022-10-20 NOTE — Patient Instructions (Addendum)
Thank you for coming in today  Labs were done today, if any labs are abnormal the clinic will call you No news is good news  Your physician recommends that you schedule a follow-up appointment in:  3-4 months with Dr. Aundra Dubin. You will receive a reminder letter in the mail a few months in advance. If you don't receive a letter, please call our office to schedule the follow-up appointment.     Do the following things EVERYDAY: Weigh yourself in the morning before breakfast. Write it down and keep it in a log. Take your medicines as prescribed Eat low salt foods--Limit salt (sodium) to 2000 mg per day.  Stay as active as you can everyday Limit all fluids for the day to less than 2 liters  At the [facility], you and your health needs are our priority. As part of our continuing mission to provide you with exceptional heart care, we have created designated Provider Care Teams. These Care Teams include your primary Cardiologist (physician) and Advanced Practice Providers (APPs- Physician Assistants and Nurse Practitioners) who all work together to provide you with the care you need, when you need it.   You may see any of the following providers on your designated Care Team at your next follow up: Dr Glori Bickers Dr [name] Dr. Roxana Hires, NP Lyda Jester, [place] [facility] [place], [place] Forestine Na, NP Audry Riles, PharmD   Please be sure to bring in all your medications bottles to every appointment.   If you have any questions or concerns before your next appointment please send [place] a message through [place] or call our office at [phone].    TO LEAVE A MESSAGE FOR THE NURSE SELECT OPTION 2, PLEASE LEAVE A MESSAGE INCLUDING: YOUR NAME DATE OF BIRTH CALL BACK NUMBER REASON FOR CALL**this is important as we prioritize the call backs  YOU WILL RECEIVE A CALL BACK THE SAME DAY AS LONG AS YOU CALL BEFORE 4:00 PM

## 2022-10-20 NOTE — Addendum Note (Signed)
Encounter addended by: Rafael Bihari, FNP on: 10/20/2022 10:31 AM  Actions taken: Clinical Note Signed

## 2022-10-22 ENCOUNTER — Telehealth: Payer: Self-pay | Admitting: *Deleted

## 2022-10-22 NOTE — Telephone Encounter (Signed)
CALLED PATIENT TO INFORM OF CT FOR 11-11-22 - ARRIVAL TIME- 1:15 PM @ [facility], PATIENT TO HAVE WATER ONLY - 4 HRS. PRIOR TO TEST, PATIENT TO RECEIVE RESULTS FROM ASHLYN BRUNING VIA TELEPHONE ON 11-12-22 @ 8:30 AM, LVM FOR A RETURN CALL

## 2022-10-22 NOTE — Telephone Encounter (Signed)
Left message to call back  

## 2022-10-22 NOTE — Telephone Encounter (Signed)
Camnitz, Ocie Doyne, MD  Stanton Kidney, RN Stop Phyllis Ginger. Lets have him follow up in AF clinic for rate control.

## 2022-10-29 ENCOUNTER — Other Ambulatory Visit (HOSPITAL_COMMUNITY): Payer: Self-pay | Admitting: Cardiology

## 2022-10-29 ENCOUNTER — Ambulatory Visit: Payer: Self-pay | Attending: Internal Medicine

## 2022-10-29 DIAGNOSIS — I4891 Unspecified atrial fibrillation: Secondary | ICD-10-CM

## 2022-10-29 DIAGNOSIS — Z5181 Encounter for therapeutic drug level monitoring: Secondary | ICD-10-CM

## 2022-10-29 LAB — POCT INR: INR: 2.5 (ref 2.0–3.0)

## 2022-10-29 NOTE — Patient Instructions (Signed)
Description   Continue taking Warfarin 3 tablets daily except 2 tablets on Sunday. Recheck INR in 3 weeks. Stay consistent with greens each week. Coumadin Clinic [phone].

## 2022-11-03 ENCOUNTER — Ambulatory Visit: Payer: Self-pay

## 2022-11-10 DIAGNOSIS — D485 Neoplasm of uncertain behavior of skin: Secondary | ICD-10-CM | POA: Diagnosis not present

## 2022-11-10 DIAGNOSIS — C44622 Squamous cell carcinoma of skin of right upper limb, including shoulder: Secondary | ICD-10-CM | POA: Diagnosis not present

## 2022-11-10 DIAGNOSIS — Z85828 Personal history of other malignant neoplasm of skin: Secondary | ICD-10-CM | POA: Diagnosis not present

## 2022-11-11 ENCOUNTER — Encounter: Payer: Self-pay | Admitting: Urology

## 2022-11-11 ENCOUNTER — Ambulatory Visit (HOSPITAL_COMMUNITY): Payer: Self-pay

## 2022-11-11 NOTE — Progress Notes (Addendum)
Telephone nursing appointment for an 88 yo gentleman w/ putative clinical stage IA2 (13 mm) non-small cell carcinoma of the right upper lung. Patient is to receive most recent CT results per Ashlyn Bruning PA-C. I verified patient's identity and began nursing interview. Patient reports mild, occasional SOB. No other issues reported at this time.   Meaningful use complete.   Patient aware of their 8:30am-11/12/22 telephone appointment w/ Ashlyn Bruning PA-C. I left my extension [phone] in case patient needs anything. Patient verbalized understanding. This concludes the nursing interview.   Patient contact [phone]     Leandra Kern, LPN

## 2022-11-12 ENCOUNTER — Ambulatory Visit
Admission: RE | Admit: 2022-11-12 | Discharge: 2022-11-12 | Disposition: A | Discharge status: Home / Self Care | Payer: Self-pay | Source: Ambulatory Visit | Attending: Urology | Admitting: Urology

## 2022-11-12 ENCOUNTER — Telehealth: Payer: Self-pay | Admitting: *Deleted

## 2022-11-12 NOTE — Telephone Encounter (Signed)
CALLED PATIENT TO INFORM OF CT FOR 11-24-22- ARRIVAL TIME - 2:45 PM @ [facility], PATIENT TO HAVE WATER ONLY- 4 HRS. PRIOR TO TEST, PATIENT TO RECEIVE RESULTS FROM ASHLYN BRUNING ON 11-25-22 @ 11:30 AM VIA TELEPHONE, SPOKE WITH PATIENT AND HE IS AWARE OF THESE APPTS. AND THE INSTRUCTIONS

## 2022-11-19 ENCOUNTER — Ambulatory Visit: Payer: Self-pay | Attending: Cardiovascular Disease

## 2022-11-19 DIAGNOSIS — Z5181 Encounter for therapeutic drug level monitoring: Secondary | ICD-10-CM

## 2022-11-19 LAB — POCT INR: INR: 1.5 — AB (ref 2.0–3.0)

## 2022-11-19 NOTE — Patient Instructions (Signed)
Description   Take 3.5 tablets today and 3.5 tablets tomorrow and then continue taking Warfarin 3 tablets daily except 2 tablets on Sunday.  Recheck INR in 3 weeks.  Stay consistent with greens each week. Coumadin Clinic [phone].

## 2022-11-24 ENCOUNTER — Encounter: Payer: Self-pay | Admitting: Urology

## 2022-11-24 ENCOUNTER — Ambulatory Visit (HOSPITAL_COMMUNITY)
Admission: RE | Admit: 2022-11-24 | Discharge: 2022-11-24 | Disposition: A | Discharge status: Home / Self Care | Payer: Self-pay | Source: Ambulatory Visit | Attending: Urology | Admitting: Urology

## 2022-11-24 DIAGNOSIS — C3411 Malignant neoplasm of upper lobe, right bronchus or lung: Secondary | ICD-10-CM | POA: Insufficient documentation

## 2022-11-24 DIAGNOSIS — C44622 Squamous cell carcinoma of skin of right upper limb, including shoulder: Secondary | ICD-10-CM | POA: Diagnosis not present

## 2022-11-24 DIAGNOSIS — L853 Xerosis cutis: Secondary | ICD-10-CM | POA: Diagnosis not present

## 2022-11-24 DIAGNOSIS — L821 Other seborrheic keratosis: Secondary | ICD-10-CM | POA: Diagnosis not present

## 2022-11-24 DIAGNOSIS — L812 Freckles: Secondary | ICD-10-CM | POA: Diagnosis not present

## 2022-11-24 DIAGNOSIS — L570 Actinic keratosis: Secondary | ICD-10-CM | POA: Diagnosis not present

## 2022-11-24 DIAGNOSIS — D1801 Hemangioma of skin and subcutaneous tissue: Secondary | ICD-10-CM | POA: Diagnosis not present

## 2022-11-24 DIAGNOSIS — Z85828 Personal history of other malignant neoplasm of skin: Secondary | ICD-10-CM | POA: Diagnosis not present

## 2022-11-24 MED ORDER — IOHEXOL 300 MG/ML  SOLN
75.0000 mL | Freq: Once | INTRAMUSCULAR | Status: AC | PRN
  Administered 2022-11-24: 75 mL via INTRAVENOUS

## 2022-11-24 NOTE — Progress Notes (Addendum)
Telephone nursing appointment for an 88 yo gentleman with a putative clinical stage IA2 (13 mm) non-small cell carcinoma of the right upper lung. Patient is to receive most recent scan results from 11/24/22 per Ashlyn Bruning PA-C. I verified patient's identity and began nursing interview. Patient reports occasional SOB, but otherwise is doing well.   Meaningful use complete.   Patient aware of their 11:30am-11/25/22 telephone appointment w/ Ashlyn Bruning PA-C. I left my extension [phone] in case patient needs anything. Patient verbalized understanding. This concludes the nursing interview.   Patient contact [phone]     Leandra Kern, LPN

## 2022-11-25 ENCOUNTER — Ambulatory Visit
Admission: RE | Admit: 2022-11-25 | Discharge: 2022-11-25 | Disposition: A | Discharge status: Home / Self Care | Payer: Self-pay | Source: Ambulatory Visit | Attending: Urology | Admitting: Urology

## 2022-11-25 DIAGNOSIS — C3411 Malignant neoplasm of upper lobe, right bronchus or lung: Secondary | ICD-10-CM

## 2022-11-25 NOTE — Progress Notes (Signed)
Radiation Oncology         [phone]) [phone] ________________________________  Name: Nathaniel [name]. MRN: [id number]  Date: 11/25/2022  DOB: 02/17/1932  Post Treatment Note  CC: Marin Olp, MD  Marin Olp, MD  Diagnosis:   88 yo gentleman with a putative clinical stage IA2 (13 mm) non-small cell carcinoma of the right upper lung.     Interval Since Last Radiation:  6 months   04/22/22-04/27/22:  The target was treated to 54 Gy in 3 fractions of 18 Gy  Narrative:  I spoke with the patient to conduct his routine scheduled follow up visit to review results of his recent post-treatment CT Chest scan via telephone to spare the patient unnecessary potential exposure in the healthcare setting during the current COVID-19 pandemic.  The patient was notified in advance and gave permission to proceed with this visit format.  He tolerated radiation treatment well, without complaints.                               On review of systems, the patient remains without complaints. He denies increased cough, shortness of breath, dyspnea or chest pain. He has not had recent fevers, chills, night sweats or unintentional weight loss. He remains active as a Barista, is quite pleased with his progress to date.  He had a recent follow-up CT chest on 11/24/2022 which shows continued evolving radiation changes in the treated right apical lung lesion as well as some slight interval enlargement of mediastinal and right hilar lymph nodes, likely reactive/inflammatory.  We reviewed these results today.  ALLERGIES:  is allergic to barbiturates, grass extracts [gramineae pollens], milk-related compounds, and amiodarone.  Meds: Current Outpatient Medications  Medication Sig Dispense Refill   atorvastatin (LIPITOR) 20 MG tablet Take 1 tablet (20 mg total) by mouth daily. Alternating with 30mg  30 tablet 5   Coenzyme Q10 (CO Q10) 200 MG CAPS Take 200 mg by mouth daily.     dofetilide (TIKOSYN) 250  MCG capsule TAKE ONE CAPSULE TWICE A DAY 60 capsule 11   dorzolamide-timolol (COSOPT) 22.3-6.8 MG/ML ophthalmic solution Place 1 drop into both eyes every evening.     ezetimibe (ZETIA) 10 MG tablet Take 1 tablet (10 mg total) by mouth at bedtime. 90 tablet 3   furosemide (LASIX) 20 MG tablet Take 2 tablets (40 mg total) by mouth 2 (two) times daily. 120 tablet 6   furosemide (LASIX) 40 MG tablet Take 40 mg by mouth 2 (two) times daily.     latanoprost (XALATAN) 0.005 % ophthalmic solution Place 1 drop into the left eye at bedtime.     METAMUCIL FIBER PO Take 1 Dose by mouth 2 (two) times a week.     metoprolol succinate (TOPROL-XL) 50 MG 24 hr tablet Take 1 tablet (50 mg total) by mouth daily. Take with or immediately following a meal. 90 tablet 3   Misc Natural Products (GLUCOSAMINE CHONDROITIN TRIPLE) TABS Take 1 tablet by mouth daily.     ramipril (ALTACE) 2.5 MG capsule Take 1 capsule (2.5 mg total) by mouth daily. (Patient taking differently: Take 2.5 mg by mouth 2 (two) times daily.) 90 capsule 3   REPATHA SURECLICK 503 MG/ML SOAJ INJECT 1 PEN INTO SKIN EVERY 14 DAYS 2 mL 11   spironolactone (ALDACTONE) 25 MG tablet Take 1 tablet (25 mg total) by mouth daily. 30 tablet 5   tamsulosin (FLOMAX) 0.4  MG CAPS capsule Take 1 capsule (0.4 mg total) by mouth 2 (two) times daily. 180 capsule 3   warfarin (COUMADIN) 3 MG tablet Tomorrow (9/12) take 2 tablets (6mg ) Starting 9/13 take 3 tablets (9mg ) daily or as directed by [facility] Clinic (Patient taking differently: Take 9 mg by mouth daily. Patient takes 6 mg on Sunday.) 90 tablet 2   No current facility-administered medications for this encounter.    Physical Findings:  vitals were not taken for this visit.  Pain Assessment Pain Score: 0-No pain/10 Unable to assess due to telephone follow up visit format.  Lab Findings: Lab Results  Component Value Date   WBC 6.9 09/09/2022   HGB 12.7 (L) 09/09/2022   HCT 39.4 09/09/2022   MCV 93.1  09/09/2022   PLT 202 09/09/2022     Radiographic Findings: CT Chest W Contrast  Result Date: 11/25/2022 CLINICAL DATA:  Restaging non-small cell lung cancer. Finish SBRT in May 2023. * Tracking Code: BO * EXAM: CT CHEST WITH CONTRAST TECHNIQUE: Multidetector CT imaging of the chest was performed during intravenous contrast administration. RADIATION DOSE REDUCTION: This exam was performed according to the departmental dose-optimization program which includes automated exposure control, adjustment of the mA and/or kV according to patient size and/or use of iterative reconstruction technique. CONTRAST:  75mL OMNIPAQUE IOHEXOL 300 MG/ML  SOLN COMPARISON:  Multiple prior imaging studies. The most recent CT scan is 08/07/2022 and most recent PET-CT is 03/30/2022 FINDINGS: Cardiovascular: The heart is normal in size. No pericardial effusion. Stable tortuosity, ectasia and calcification of the thoracic aorta. Stable advanced three-vessel coronary artery calcifications. Stable surgical changes from coronary artery bypass surgery. Mediastinum/Nodes: Slight interval enlargement mediastinal lymph nodes. Upper right paratracheal node on image 43/2 measures 8 mm and previously measured 3 mm. Precarinal node on image 66/2 measures 10 mm and previously measured 5 mm. AP window node measures 10 mm on image 64/2 and previously measured 5 mm. 6 mm right hilar node on image 68/2 appears to be new. It is possible these are reactive/inflammatory but I would recommend a short-term follow-up CT scan in 3 months to reassess. Lungs/Pleura: Radiation changes involving the right apical lung mass again demonstrated. The lesion appears more solid and slightly smaller which would suggest progressive fibrotic changes. It measures a maximum of 3.4 x 2.7 cm. No new pulmonary lesions or pulmonary nodules. Stable underlying emphysematous changes and pulmonary scarring, particular at the lung bases. No pleural effusions or pleural nodules.  Upper Abdomen: No significant upper abdominal findings. No hepatic or adrenal gland lesions to suggest metastatic disease. Stable advanced vascular disease. Musculoskeletal: No chest wall mass, supraclavicular or axillary adenopathy. The bony thorax is intact. No worrisome lytic or sclerotic bone lesions. IMPRESSION: 1. Radiation changes involving the right apical lung mass. The lesion appears more solid and slightly smaller which would suggest progressive fibrotic changes. 2. Slight interval enlargement of mediastinal and right hilar lymph nodes. It is possible these are reactive/inflammatory but I would recommend a short-term follow-up CT scan in 3 months to reassess. 3. No findings for upper abdominal metastatic disease. 4. Stable emphysematous changes and pulmonary scarring. 5. Stable advanced vascular disease. Aortic Atherosclerosis (ICD10-I70.0) and Emphysema (ICD10-J43.9). Electronically Signed   By: Marijo Sanes M.D.   On: 11/25/2022 08:59    Impression/Plan: 1. 88 yo gentleman with a putative clinical stage IA2 (13 mm) non-small cell carcinoma of the right upper lung. He appears to have recovered well from the effects of his recent SBRT lung and  is without complaints. The recent follow up CT Chest scan from 11/24/22 shows radiation changes involving the right apical lung mass which appears more solid and slightly smaller, suggesting progressive fibrotic changes. There is also slight interval enlargement of mediastinal and right hilar lymph nodes, likely reactive/inflammatory, exacerbated by his immunotherapy that he is on for his cholesterol. We will plan to  get a follow up CT Chest scan in 3 months to reassess and continue to monitor closely. We will call him with the results of that scan as soon as available and pending that scan is stable or improved, we will then proceed with serial CT Chest scans every 6 months to monitor for any evidence of disease progression or recurrence. If that scan  indicates any evidence of progression, we will proceed with a repeat PET scan for further evaluation.   I personally spent 20 minutes in this encounter including chart review, reviewing radiological studies, telephone conversation with the patient, entering orders and completing documentation.     Nicholos Johns, PA-C

## 2022-12-09 DIAGNOSIS — Z85828 Personal history of other malignant neoplasm of skin: Secondary | ICD-10-CM | POA: Diagnosis not present

## 2022-12-09 DIAGNOSIS — C44622 Squamous cell carcinoma of skin of right upper limb, including shoulder: Secondary | ICD-10-CM | POA: Diagnosis not present

## 2022-12-10 ENCOUNTER — Telehealth: Payer: Self-pay | Admitting: *Deleted

## 2022-12-10 ENCOUNTER — Ambulatory Visit: Payer: Self-pay

## 2022-12-10 NOTE — Telephone Encounter (Signed)
Called pt since she he missed his appt; there was no answer therefore left a message to call back to reschedule.

## 2022-12-21 ENCOUNTER — Ambulatory Visit: Payer: Self-pay | Attending: Cardiology | Admitting: *Deleted

## 2022-12-21 DIAGNOSIS — Z5181 Encounter for therapeutic drug level monitoring: Secondary | ICD-10-CM

## 2022-12-21 LAB — POCT INR: INR: 3.6 — AB (ref 2.0–3.0)

## 2022-12-21 NOTE — Patient Instructions (Signed)
Description   Do not take any warfarin today then continue taking Warfarin 3 tablets daily except 2 tablets on Sunday. Recheck INR in 3 weeks.  Stay consistent with greens each week. Coumadin Clinic [phone].

## 2022-12-28 ENCOUNTER — Ambulatory Visit: Payer: Self-pay | Admitting: Family Medicine

## 2022-12-28 ENCOUNTER — Telehealth: Payer: Self-pay | Admitting: *Deleted

## 2022-12-28 NOTE — Telephone Encounter (Signed)
xxxxx

## 2022-12-28 NOTE — Telephone Encounter (Signed)
Called patient to inform of CT for 02-24-23- arrival time- 1:30 pm @ [facility], patient to have water only- 4 hrs. prior to test, labs to be drawn I- Stat in Radiology, patient to receive results from Nathaniel [name] on 02-25-23 @ 2 pm via telephone, spoke with patient and he is aware of these appts. and the instructions

## 2022-12-28 NOTE — Telephone Encounter (Signed)
Left message informing pt I have been trying to get in touch with him for several months now (this would be 3rd attempt). Will give pt time to call back before sending pt a letter.

## 2023-01-08 ENCOUNTER — Encounter: Payer: Self-pay | Admitting: Family Medicine

## 2023-01-08 ENCOUNTER — Ambulatory Visit (INDEPENDENT_AMBULATORY_CARE_PROVIDER_SITE_OTHER): Payer: Self-pay | Admitting: Family Medicine

## 2023-01-08 VITALS — BP 108/62 | HR 95 | Temp 97.6°F | Ht 70.0 in | Wt 189.6 lb

## 2023-01-08 DIAGNOSIS — E118 Type 2 diabetes mellitus with unspecified complications: Secondary | ICD-10-CM

## 2023-01-08 DIAGNOSIS — R310 Gross hematuria: Secondary | ICD-10-CM

## 2023-01-08 DIAGNOSIS — I4819 Other persistent atrial fibrillation: Secondary | ICD-10-CM

## 2023-01-08 DIAGNOSIS — I739 Peripheral vascular disease, unspecified: Secondary | ICD-10-CM

## 2023-01-08 DIAGNOSIS — I5042 Chronic combined systolic (congestive) and diastolic (congestive) heart failure: Secondary | ICD-10-CM

## 2023-01-08 DIAGNOSIS — D6869 Other thrombophilia: Secondary | ICD-10-CM

## 2023-01-08 DIAGNOSIS — C3411 Malignant neoplasm of upper lobe, right bronchus or lung: Secondary | ICD-10-CM

## 2023-01-08 NOTE — Progress Notes (Signed)
Phone [phone] In person visit   Subjective:   Nathaniel [name]. is a 89 y.o. year old very pleasant male patient who presents for/with See problem oriented charting Chief Complaint  Patient presents with   Follow-up    (NO MASK) Pt states he has had a bout with afib and has not been able to come out of it.   Past Medical History-  Patient Active Problem List   Diagnosis Date Noted   Controlled type 2 diabetes mellitus with complication, without long-term current use of insulin ([place]) 02/19/2022    Priority: High   Afib ([place]) 06/12/2019    Priority: High   Chronic combined systolic and diastolic heart failure ([place]) 06/12/2014    Priority: High   CAD (coronary artery disease) of artery bypass graft 09/14/2012    Priority: High   Carotid artery disease without cerebral infarction ([place]) 09/14/2012    Priority: High   History of aortic valve replacement with bioprosthetic valve 09/14/2012    Priority: High   Aortic atherosclerosis ([place]) 02/19/2022    Priority: Medium    Chronic gout without tophus 02/19/2022    Priority: Medium    BPH (benign prostatic hyperplasia) 10/11/2021    Priority: Medium    Asymptomatic carotid artery stenosis without infarction, right 02/02/2018    Priority: Medium    Solitary pulmonary nodule 03/23/2016    Priority: Medium    Pulmonary nodule 12/25/2015    Priority: Medium    AAA (abdominal aortic aneurysm) without rupture ([place]) 04/13/2014    Priority: Medium    Occlusion and stenosis of carotid artery without mention of cerebral infarction 04/09/2014    Priority: Medium    Abdominal aortic aneurysm ([place]) 09/14/2012    Priority: Medium    Peripheral vascular disease with claudication ([place]) 09/14/2012    Priority: Medium    Essential hypertension 09/14/2012    Priority: Medium    Hyperlipidemia with target LDL less than 70 09/14/2012    Priority: Medium    Secondary hypercoagulable state ([place]) 02/19/2022    Priority: Low   Rhabdomyolysis  10/10/2021    Priority: Low   Aspiration of foreign body     Priority: Low   Foreign body aspiration 12/23/2015    Priority: Low   Encounter for therapeutic drug monitoring 03/28/2015    Priority: Low   NSTEMI (non-ST elevated myocardial infarction) ([place]) 08/20/2022   Putative cancer of right upper lobe of lung ([place]) 04/01/2022    Medications- reviewed and updated Current Outpatient Medications  Medication Sig Dispense Refill   atorvastatin (LIPITOR) 20 MG tablet Take 1 tablet (20 mg total) by mouth daily. Alternating with 30mg  30 tablet 5   Coenzyme Q10 (CO Q10) 200 MG CAPS Take 200 mg by mouth daily.     dofetilide (TIKOSYN) 250 MCG capsule TAKE ONE CAPSULE TWICE A DAY 60 capsule 11   dorzolamide-timolol (COSOPT) 22.3-6.8 MG/ML ophthalmic solution Place 1 drop into both eyes every evening.     ezetimibe (ZETIA) 10 MG tablet Take 1 tablet (10 mg total) by mouth at bedtime. 90 tablet 3   furosemide (LASIX) 20 MG tablet Take 2 tablets (40 mg total) by mouth 2 (two) times daily. 120 tablet 6   furosemide (LASIX) 40 MG tablet Take 40 mg by mouth 2 (two) times daily.     latanoprost (XALATAN) 0.005 % ophthalmic solution Place 1 drop into the left eye at bedtime.     METAMUCIL FIBER PO Take 1 Dose by mouth 2 (two)  times a week.     metoprolol succinate (TOPROL-XL) 50 MG 24 hr tablet Take 1 tablet (50 mg total) by mouth daily. Take with or immediately following a meal. 90 tablet 3   Misc Natural Products (GLUCOSAMINE CHONDROITIN TRIPLE) TABS Take 1 tablet by mouth daily.     ramipril (ALTACE) 2.5 MG capsule Take 1 capsule (2.5 mg total) by mouth daily. (Patient taking differently: Take 2.5 mg by mouth 2 (two) times daily.) 90 capsule 3   REPATHA SURECLICK 073 MG/ML SOAJ INJECT 1 PEN INTO SKIN EVERY 14 DAYS 2 mL 11   spironolactone (ALDACTONE) 25 MG tablet Take 1 tablet (25 mg total) by mouth daily. 30 tablet 5   tamsulosin (FLOMAX) 0.4 MG CAPS capsule Take 1 capsule (0.4 mg total) by mouth 2  (two) times daily. 180 capsule 3   warfarin (COUMADIN) 3 MG tablet Tomorrow (9/12) take 2 tablets (6mg ) Starting 9/13 take 3 tablets (9mg ) daily or as directed by Coumadin Clinic (Patient taking differently: Take 9 mg by mouth daily. Patient takes 6 mg on Sunday.) 90 tablet 2   No current facility-administered medications for this visit.     Objective:  BP 108/62   Pulse 95   Temp 97.6 F (36.4 C)   Ht 5\' 10"  (1.778 m)   Wt 189 lb 9.6 oz (86 kg)   SpO2 95%   BMI 27.20 kg/m  Gen: NAD, resting comfortably CV: irregularly irregular  Lungs: CTAB no crackles, wheeze, rhonchi  Ext: trace edema Skin: warm, dry     Assessment and Plan    #CHF-systolic and diastolic mild-follows with Dr. Aundra Dubin #hypertension S: medication: spironolactone 25 mg daily (per patient) metoprolol 50 mg XR,  ramipril 2.5 mg BID and Lasix 40 mg in morning and 20 mg in the evening - no weight gain or increased edema BP Readings from Last 3 Encounters:  01/08/23 108/62  10/20/22 116/70  10/12/22 110/66  A/P: CHF- appears euvolemic- continue current medications HYPERTENSION- stable- continue current medicines   #CAD- CABG and redo CABG. Cath 08/21/22- still 99% stenosis in the SVG- ramus -also has bioprosthetic aortic valve replacement history #PAD- >50% bilateral focal SFA stenosis- not cilostazol candidate with CHF #Carotid stenosis- s/p CEA in 2019- follows with Dr. Trula Slade #hyperlipidemia/aortic atherosclerosis S: Medication:Zetia 10 mg at bedtime,  atorvastatin 20 mg and 30 mg on alternating days (coQ10), also prescribed repatha- takes intermittently but encouraged better consistency -Not on aspirin as a result of being on coumadin - no claudication. No chest pain. Stable shortness of breath  Lab Results  Component Value Date   CHOL 81 08/22/2022   HDL 36 (L) 08/22/2022   LDLCALC 26 08/22/2022   LDLDIRECT 98 05/08/2009   TRIG 97 08/22/2022   CHOLHDL 2.3 08/22/2022  A/P: CAD with stable dyspnea-  continue current medications - medical management planned PAD- decreased pulses on exam but no claudication (but also limited by his CAD) monitor- continue current medications  Lipids- at goal on last check- continue current medications   # Persistent atrial fibrillation-sees Dr. Curt Bears S:medication: dofetilide/tikosyn 250 mcg BID, metoprolol 50 mg XL, coumadin - had been managed by cardiology clinic -history of cardioversion and has failed efforts- not best candidate for ablation -current rate control strategy A/P: appropriately anticoagulated and rate controlled- continue current medicine - had been having more shortness of breath before he increased the metoprolol- may have been going into RVR or at least higher HR range   # Diabetes- prior followed by Dr. Elyse Hsu  S: Medication: none  -UTI on jardiance Lab Results  Component Value Date   HGBA1C 6.9 (H) 06/23/2022   HGBA1C 6.5 05/08/2009   A/P: hopefully stable- update a1c today. Continue without meds for now  # BPH S:medication: tamsulosin 0.5mg  BID  -prior cardura - ongoing urinary frequency- but overall improved form years ago A/P: stable- continue current medicines    #Gout S: no  flares in years on allopurinol 300mg - he stopped in 2024 on his own A/P:he wants to hold off on treatment- consider uric acid if has issues in the person   # Lung cancer- with lung nodule 01/05/18 first detected -showed worsening- refer to [facility] - likely cancer-was treated with SBRT no surgery -last scan 11/24/22- with planned 3 month repeat as slight interval increase in a few lymph nodes  #Prior hematuria- we referred him back to urology last visit. He stopped drinking champagne and symptoms resolved  Recommended follow up: Return in about 6 months (around 07/09/2023) for followup or sooner if needed.Schedule b4 you leave. Future Appointments  Date Time Provider [place]  01/12/2023 11:45 AM [facility] None  02/24/2023  2:00 PM [facility] 2 [facility] [place]  02/25/2023  2:00 PM Bruning, Ashlyn, PA-C [facility] None   Lab/Order associations:   ICD-10-CM   1. Controlled type 2 diabetes mellitus with complication, without long-term current use of insulin (HCC)  E11.8 Hemoglobin A1c    CBC with Differential/Platelet    Comprehensive metabolic panel    2. Chronic combined systolic and diastolic heart failure (HCC) Chronic I50.42     3. Peripheral vascular disease with claudication (HCC) Chronic I73.9     4. Persistent atrial fibrillation (HCC) Chronic I48.19     5. Secondary hypercoagulable state ([place]) Chronic D68.69     6. Primary cancer of right upper lobe of lung (HCC) Chronic C34.11     7. Gross hematuria  R31.0 Ambulatory referral to Urology     Return precautions advised.  Garret Reddish, MD

## 2023-01-08 NOTE — Patient Instructions (Addendum)
Get your diabetic eye exam scheduled.  Consider prevnar 20 (pneumonia shot) here or at the pharmacy  Make sure to take the repatha every 2 weeks  We will call you within two weeks about your referral to urology. If you do not hear within 2 weeks, give [place] a call.   Please stop by lab before you go If you have mychart- we will send your results within 3 business days of [place] receiving them.  If you do not have mychart- we will call you about results within 5 business days of [place] receiving them.  *please also note that you will see labs on mychart as soon as they post. I will later go in and write notes on them- will say "notes from Dr. Yong Channel"   Recommended follow up: Return in about 6 months (around 07/09/2023) for followup or sooner if needed.Schedule b4 you leave.

## 2023-01-09 LAB — COMPREHENSIVE METABOLIC PANEL
AG Ratio: 1.4 (calc) (ref 1.0–2.5)
ALT: 15 U/L (ref 9–46)
AST: 25 U/L (ref 10–35)
Albumin: 4 g/dL (ref 3.6–5.1)
Alkaline phosphatase (APISO): 57 U/L (ref 35–144)
BUN: 24 mg/dL (ref 7–25)
CO2: 22 mmol/L (ref 20–32)
Calcium: 8.9 mg/dL (ref 8.6–10.3)
Chloride: 100 mmol/L (ref 98–110)
Creat: 1.13 mg/dL (ref 0.70–1.22)
Globulin: 2.8 g/dL (calc) (ref 1.9–3.7)
Glucose, Bld: 127 mg/dL — ABNORMAL HIGH (ref 65–99)
Potassium: 4.8 mmol/L (ref 3.5–5.3)
Sodium: 136 mmol/L (ref 135–146)
Total Bilirubin: 0.8 mg/dL (ref 0.2–1.2)
Total Protein: 6.8 g/dL (ref 6.1–8.1)

## 2023-01-09 LAB — CBC WITH DIFFERENTIAL/PLATELET
Absolute Monocytes: 656 cells/uL (ref 200–950)
Basophils Absolute: 48 cells/uL (ref 0–200)
Basophils Relative: 0.7 %
Eosinophils Absolute: 193 cells/uL (ref 15–500)
Eosinophils Relative: 2.8 %
HCT: 40.4 % (ref 38.5–50.0)
Hemoglobin: 13.5 g/dL (ref 13.2–17.1)
Lymphs Abs: 1428 cells/uL (ref 850–3900)
MCH: 29.3 pg (ref 27.0–33.0)
MCHC: 33.4 g/dL (ref 32.0–36.0)
MCV: 87.8 fL (ref 80.0–100.0)
MPV: 10.4 fL (ref 7.5–12.5)
Monocytes Relative: 9.5 %
Neutro Abs: 4575 cells/uL (ref 1500–7800)
Neutrophils Relative %: 66.3 %
Platelets: 183 10*3/uL (ref 140–400)
RBC: 4.6 10*6/uL (ref 4.20–5.80)
RDW: 12.8 % (ref 11.0–15.0)
Total Lymphocyte: 20.7 %
WBC: 6.9 10*3/uL (ref 3.8–10.8)

## 2023-01-09 LAB — HEMOGLOBIN A1C
Hgb A1c MFr Bld: 7.7 % of total Hgb — ABNORMAL HIGH (ref <5.7)
Mean Plasma Glucose: 174 mg/dL
eAG (mmol/L): 9.7 mmol/L

## 2023-01-12 ENCOUNTER — Ambulatory Visit: Payer: Self-pay | Attending: Cardiovascular Disease

## 2023-01-21 ENCOUNTER — Encounter (HOSPITAL_COMMUNITY): Payer: Self-pay | Admitting: *Deleted

## 2023-01-27 ENCOUNTER — Encounter: Payer: Self-pay | Admitting: *Deleted

## 2023-01-27 NOTE — Telephone Encounter (Signed)
Mailed letter to address on file.

## 2023-01-28 ENCOUNTER — Ambulatory Visit: Payer: Self-pay | Attending: Internal Medicine

## 2023-02-04 ENCOUNTER — Ambulatory Visit: Payer: Self-pay | Attending: Cardiovascular Disease

## 2023-02-04 DIAGNOSIS — Z5181 Encounter for therapeutic drug level monitoring: Secondary | ICD-10-CM

## 2023-02-04 LAB — POCT INR: INR: 1.9 — AB (ref 2.0–3.0)

## 2023-02-04 NOTE — Patient Instructions (Signed)
Description   Take 3.5 tablets today and then continue taking Warfarin 3 tablets daily except 2 tablets on Sunday. Recheck INR in 3 weeks.  Stay consistent with greens each week. [facility] Clinic [phone].

## 2023-02-24 ENCOUNTER — Ambulatory Visit (HOSPITAL_COMMUNITY): Admission: RE | Admit: 2023-02-24 | Payer: Self-pay | Source: Ambulatory Visit

## 2023-02-25 ENCOUNTER — Ambulatory Visit: Payer: Self-pay | Attending: Cardiovascular Disease

## 2023-02-25 ENCOUNTER — Telehealth: Payer: Self-pay | Admitting: *Deleted

## 2023-02-25 ENCOUNTER — Ambulatory Visit: Payer: Self-pay | Admitting: Urology

## 2023-02-25 DIAGNOSIS — Z5181 Encounter for therapeutic drug level monitoring: Secondary | ICD-10-CM

## 2023-02-25 DIAGNOSIS — C3411 Malignant neoplasm of upper lobe, right bronchus or lung: Secondary | ICD-10-CM

## 2023-02-25 LAB — POCT INR: INR: 2.7 (ref 2.0–3.0)

## 2023-02-25 NOTE — Patient Instructions (Signed)
Description   Continue taking Warfarin 3 tablets daily except 2 tablets on Sunday.  Recheck INR in 4 weeks.  Stay consistent with greens each week. Coumadin Clinic [phone].

## 2023-02-25 NOTE — Telephone Encounter (Signed)
Called patient's grandaughter- Divyesh Lapiana and lvm to return call regarding her grandfather's missed CT

## 2023-03-24 ENCOUNTER — Other Ambulatory Visit: Payer: Self-pay | Admitting: Family Medicine

## 2023-03-25 ENCOUNTER — Telehealth: Payer: Self-pay | Admitting: *Deleted

## 2023-03-25 ENCOUNTER — Ambulatory Visit: Payer: Medicare Other | Attending: Cardiology

## 2023-03-25 NOTE — Telephone Encounter (Signed)
Pt missed INR appt today. Called pt and Lmom for pt to call back to reschedule.

## 2023-04-08 ENCOUNTER — Ambulatory Visit (HOSPITAL_COMMUNITY)
Admission: RE | Admit: 2023-04-08 | Discharge: 2023-04-08 | Disposition: A | Discharge status: Home / Self Care | Payer: Medicare Other | Source: Ambulatory Visit | Attending: Physician Assistant | Admitting: Physician Assistant

## 2023-04-08 ENCOUNTER — Encounter (HOSPITAL_COMMUNITY): Payer: Self-pay

## 2023-04-08 VITALS — BP 104/80 | HR 86 | Wt 185.2 lb

## 2023-04-08 DIAGNOSIS — I2510 Atherosclerotic heart disease of native coronary artery without angina pectoris: Secondary | ICD-10-CM | POA: Insufficient documentation

## 2023-04-08 DIAGNOSIS — Z794 Long term (current) use of insulin: Secondary | ICD-10-CM

## 2023-04-08 DIAGNOSIS — C3491 Malignant neoplasm of unspecified part of right bronchus or lung: Secondary | ICD-10-CM

## 2023-04-08 DIAGNOSIS — I714 Abdominal aortic aneurysm, without rupture, unspecified: Secondary | ICD-10-CM | POA: Diagnosis not present

## 2023-04-08 DIAGNOSIS — Z8774 Personal history of (corrected) congenital malformations of heart and circulatory system: Secondary | ICD-10-CM | POA: Diagnosis not present

## 2023-04-08 DIAGNOSIS — Z953 Presence of xenogenic heart valve: Secondary | ICD-10-CM | POA: Diagnosis not present

## 2023-04-08 DIAGNOSIS — I5022 Chronic systolic (congestive) heart failure: Secondary | ICD-10-CM | POA: Insufficient documentation

## 2023-04-08 DIAGNOSIS — I5032 Chronic diastolic (congestive) heart failure: Secondary | ICD-10-CM | POA: Diagnosis not present

## 2023-04-08 DIAGNOSIS — I4821 Permanent atrial fibrillation: Secondary | ICD-10-CM | POA: Diagnosis not present

## 2023-04-08 DIAGNOSIS — C3490 Malignant neoplasm of unspecified part of unspecified bronchus or lung: Secondary | ICD-10-CM | POA: Insufficient documentation

## 2023-04-08 DIAGNOSIS — I252 Old myocardial infarction: Secondary | ICD-10-CM | POA: Insufficient documentation

## 2023-04-08 DIAGNOSIS — Z87440 Personal history of urinary (tract) infections: Secondary | ICD-10-CM | POA: Insufficient documentation

## 2023-04-08 DIAGNOSIS — I110 Hypertensive heart disease with heart failure: Secondary | ICD-10-CM | POA: Diagnosis not present

## 2023-04-08 DIAGNOSIS — Z79899 Other long term (current) drug therapy: Secondary | ICD-10-CM | POA: Insufficient documentation

## 2023-04-08 DIAGNOSIS — Z923 Personal history of irradiation: Secondary | ICD-10-CM | POA: Diagnosis not present

## 2023-04-08 DIAGNOSIS — I25810 Atherosclerosis of coronary artery bypass graft(s) without angina pectoris: Secondary | ICD-10-CM | POA: Diagnosis not present

## 2023-04-08 DIAGNOSIS — Z952 Presence of prosthetic heart valve: Secondary | ICD-10-CM | POA: Diagnosis not present

## 2023-04-08 DIAGNOSIS — Z7901 Long term (current) use of anticoagulants: Secondary | ICD-10-CM | POA: Insufficient documentation

## 2023-04-08 DIAGNOSIS — E119 Type 2 diabetes mellitus without complications: Secondary | ICD-10-CM | POA: Diagnosis not present

## 2023-04-08 DIAGNOSIS — I739 Peripheral vascular disease, unspecified: Secondary | ICD-10-CM | POA: Diagnosis not present

## 2023-04-08 LAB — BASIC METABOLIC PANEL
Anion gap: 9 (ref 5–15)
BUN: 31 mg/dL — ABNORMAL HIGH (ref 8–23)
CO2: 26 mmol/L (ref 22–32)
Calcium: 9 mg/dL (ref 8.9–10.3)
Chloride: 100 mmol/L (ref 98–111)
Creatinine, Ser: 1.56 mg/dL — ABNORMAL HIGH (ref 0.61–1.24)
GFR, Estimated: 42 mL/min — ABNORMAL LOW (ref >60)
Glucose, Bld: 179 mg/dL — ABNORMAL HIGH (ref 70–99)
Potassium: 5 mmol/L (ref 3.5–5.1)
Sodium: 135 mmol/L (ref 135–145)

## 2023-04-08 LAB — BRAIN NATRIURETIC PEPTIDE: B Natriuretic Peptide: 551.7 pg/mL — ABNORMAL HIGH (ref 0.0–100.0)

## 2023-04-08 NOTE — Progress Notes (Signed)
Patient ID: Nathaniel [name]., male   DOB: 1932-06-06, 89 y.o.   MRN: [id number] PCP: Dr. Leslie [name] Cardiology: Dr. Shirlee [name]  89 y.o. with history of extensive vascular disease including CAD s/p CABG and redo CABG, AAA s/p endovascular repair in 2015, PAD, and carotid stenosis as well as prior AVR.                             After his AAA repair, he was noted to be in atrial fibrillation.  He had had a prior episode of atrial fibrillation after his cardiac surgery in 2007.  Atrial fibrillation was rate-controlled.  He was started on warfarin and Toprol XL.  He developed exertional dyspnea and fatigue and was cardioverted back to SR 06/06/14.  Echo 06/15 EF worsened to 25-30% with stable bioprosthetic aortic valve, mildly dilated RV with normal systolic function.  LHC 07/15 showed stable anatomy.  The LIMA-LAD was patent, the sequential SVG-OM/ramus/D was patent only to ramus but this is the same as the prior cath, and the SVG-PDA was patent with a long 50-60% mid-graft stenosis.  No intervention.  Of note, at cath he was noted to be back in atrial fibrillation.  He was started on amiodarone and had DCCV again on 08/21/14.  This was successful.  He has been off amiodarone due to side effects (hallucinations).  Echo in 1/16 showed recovery of EF to 55-60%.    He was admitted in 1/19 with bronchitis/wheezing/dyspnea and was found to be in atrial fibrillation with RVR.  He tested positive for metapneumovirus.  Echo showed EF 45-50% with normal  bioprosthetic aortic valve. He was diuresed for volume overload while in the hospital. He had DCCV to NSR.    Carotid dopplers in 1/19 showed 80-99% RICA stenosis, he had right CEA in 2/19 with no complications.   At appointment in early 6/20, Nathaniel [name] was noted to be back in atrial fibrillation with RVR and mild CHF.  He was started on Toprol XL and Lasix was increased.  He had TEE-guided DCCV to NSR in 6/20.  TEE showed EF 50%, mild diffuse hypokinesis, mildly decreased RV systolic function, normally functioning bioprosthetic aortic valve.   In 2021, he was admitted for Tikosyn initiation.  In 12/21, he went into atrial fibrillation again and was cardioverted back to NSR.   Echo in 1/22 showed EF 45-50%, diffuse hypokinesis, mildly decreased RV systolic function, moderate biatrial enlargement, bioprosthetic valve with mean gradient 8 mmHg.    In 2/22, he went into atrial fibrillation and had TEE-guided DCCV back to NSR.  TEE showed EF 45-50%.    In 3/22, he was back in the ER with atrial fibrillation triggered by [name] most likely.  He had a cardioversion back to NSR.   In 10/22, patient developed urinary retention and an E coli UTI.  In the setting of this, he had a fall at home and was unable to get up due to weakness for hours.  He developed rhabdomyolysis.  This resolved with minimal renal damage with IV fluid hydration and he was discharged to rehab.   Follow up 2/23 he was in atrial fibrillation, TEE/DCCV arranged however INR subtherapeutic. He cancelled rescheduled procedure due to a death in the family.  Follow up 3/23. Remained in AF. Volume overloaded. Lasix increased for 3 days. Underwent TEE guided DCCV on 03/19/22.  TEE showed EF 50-55%, mildly decreased RV function, normal bioprosthetic aortic valve, mild Nathaniel and  mild mitral stenosis (mean gradient 3 mmHg).    Recently diagnosed with stage IA2 NSCLC RUL. Saw Radiation Oncology and Pulmonary and decided on empiric treatment  with SBRT.  He has completed radiation.   Admitted 9/23 with NSTEMI and AF with RVR. Underwent R/LHC showing normal filling pressures, known occlusion of native coronaries and SVG-RCA from original CABG. New occlusion of SVG-PDA from CABG#2. Long 99% stenosis in the SVG-ramus. Ramus is relatively small vessel. Patent LIMA-LAD with collaterals to RCA territory. Case was discussed with Nathaniel [name], wouldn't benefit from intervention, medically managed. Patient declined re-challenge of amiodarone and was able to have successful TEE/DCCV to NSR. TEE showed EF 45% with normal RV, mild-moderate Nathaniel, normal bioprosthetic aortic valve, no LA appendage thrombus. Discharged home, weight 184 lbs.  Follow up 9/23, back in atrial fibrillation with HR 116. Toprol increased to 25 bid and arranged for TEE/DCCV.  TEE/DCCV (10/23) showed EF 45%, diffuse HK, RV mildly decreased, moderate Nathaniel, Bioprosthetic AoV stable with mean gradient 6 mmHg, no LAA. Proceeded with successful TEE to NSR.  Post TEE/DCCV follow up 10/07/22, remained in sinus but HR 44, and Toprol cut back to 25 daily. Saw EP 10/12/22 and was unfortunately back in AF. Felt to be failing Tikosyn. Focus now shifted to rate control strategy and Toprol increased back to 50 daily.   Last seen for f/u 11/23. Volume stable. No changes to medications.   Today he returns for HF follow up. Overall feeling ok, has felt SOB for the last 2 months, progressively gotten worst. SOB walking into clinic today. Denies palpitations, CP, dizziness, edema, or PND/Orthopnea. Appetite ok. No fever or chills. Last weight at home 176 pounds, doesn't weight frequently. Loves to drink ginger ale and chocolate ice cream floats. Taking all medications.   ReDS clip 41%  ECG: none ordered today.  Labs (1/19): K 4.9, creatinine 1.19 Labs (2/19): K 4.2, creatinine 0.89 Labs (3/19): LDL 86, HDL 45, TGs 245 Labs (5/19): LDL 110, HDL 39 Labs (6/19): K 4.1, creatinine 1.07 Labs  (11/19): LDL 14, HDL 56 Labs (6/20): K 4.1, creatinine 1.0 Labs (7/20): K 4.3, creatinine 1.07 Labs (9/21): LDL 129, hgb 14.8, K 4.1, creatinine 1.02 Labs (12/21): LDL 20, HDL 46, K 4.4, creatinine 1.61 Labs (1/22): LDL 45, HDL 49, K 4.3, creatinine 1.1, BNP 184 Labs (3/22): K 4.4, creatinine 1.10, LDL 15, HDL 49 Labs (6/22): K 4.3, creatinine 1.11, LDL 121 Labs (7/22): K 4.3, creatinine 1.23, hgb 14.5 Labs (11/22): K 4.5, creatinine 1.14 Labs (2/23): K 4.3, creatinine 1.16 Labs (7/23): K 4.4, creatinine 1.24 Labs (9/23): K 4.1, creatinine 1.19 Labs (10/23): K 4.2, creatinine 1.31 Labs (1/24): K 4.8, SCr 1.13, Hgb A1c 7.7  PMH: 1. PAD: Peripheral arterial dopplers in 2012 showed > 75% bilateral SFA stenosis. Peripheral arterial dopplers in 10/14 showed > 50% focal bilateral SFA stenoses.  2. AAA: [place] 4/15 with 4.4 cm AAA but concern for penetrating ulcers. CTA abdomen showed 4.4 cm AAA with penetrating ulcers and possible pseudoaneurysms. Now s/p endovascular repair of AAA in 5/15 with no complications.  3. Carotid stenosis: Carotid dopplers (4/15) with 60-79% bilateral ICA stenosis. Carotid dopplers (11/15) with 60-79% bilateral ICA stenosis.  Carotid dopplers 6/16 with 60-79% bilateral ICA stenosis. - Carotid dopplers (1/18): 60-79% RICA stenosis.  - Carotid dopplers (1/19): 80-99% RICA stenosis => Right CEA in 2/19.  - Carotid dopplers (11/19): 40-59% bilateral stenosis.  - Carotid dopplers (1/21): 1-39% BICA stenosis.  4. CAD: CABG 1989 with LIMA-LAD,  SVG-D, seq SVG-ramus and OM, sequential SVG-PDA/PLV. SVG-PDA/PLV found to be occluded on cath prior to AVR, so patient had SVG-PDA with AVR in 2007. LHC (7/15) with LIMA-LAD patent with 40-50% stenosis in LAD after touchdown, sequential SVG-ramus/OM/diagonal with only the ramus branch still intact (known from prior cath), SVG-PDA from original surgery TO, SVG-PDA from redo surgery with long 50-60% mid-graft stenosis.  No target for  intervention.  - LHC (9/23): known occlusion of native coronaries and SVG-RCA from original CABG, new occlusion of SVG-PDA from CABG#2, long 99% stenosis in the SVG-ramus (was sequential SVG-ramus/OM/D but OM and D branches noted to be lost on prior cath), ramus is relatively small vessel, patent LIMA-LAD with collaterals to RCA territory. Case reviewed with Nathaniel [name], probably not much benefit to intervention on SVG-ramus. Small territory covered and complex disease in SVG. Medical management advised. 5. Severe aortic stenosis: Bioprosthetic AVR in 2007.  6. Atrial fibrillation: Paroxysmal. Initially noted after cardiac surgery, then again after AAA repair.  DCCV to NSR 06/06/14.  DCCV to NSR again 08/21/14.  - DCCV to NSR in 1/19.  - DCCV to NSR in 6/20.  - Now on Tikosyn.  - DCCV to NSR 12/21.  - DCCV to NSR 2/22. - DCCV to NSR 3/22. - DCCV to NSR 4/23. - DCCV to NSR 9/23. - DCCV to NSR 10/23 7. Type II diabetes  8. Gout  9. GERD  10. Hyperlipidemia  11. HTN 12. GERD  13. Chronic systolic CHF: Echo (6/15) with EF worsened to 25-30% with grade II diastolic dysfunction, stable bioprosthetic aortic valve, mildly dilated RV with normal systolic function.  Echo (1/16) with EF 55-60%, basal inferior hypokinesis, mild LVH, bioprosthetic aortic valve with mean gradient 13 mmHg, mild Nathaniel, severe LAE.  - Echo (1/19): EF 45-50%, mild Nathaniel, normal bioprosthetic aortic valve.  - TEE (6/20): EF 50% with mild diffuse hypokinesis, mildly decreased RV systolic function with mild RV enlargement, bioprosthetic aortic valve looked ok, mild Nathaniel. - Echo (1/22):  EF 45-50%, diffuse hypokinesis, mildly decreased RV systolic function, moderate biatrial enlargement, bioprosthetic valve with mean gradient 8 mmHg.   - TEE (2/22): EF 45-50%, RV normal, moderate biatrial enlargement, bioprosthetic AoV with mean gradient 6 mmHg, mild Nathaniel.  - TEE (4/23): EF 50-55%, mildly decreased RV function, normal bioprosthetic aortic  valve, mild Nathaniel and mild mitral stenosis (mean gradient 3 mmHg) - TEE (9/23): EF 45%, RV normal, no LAA thrombus, no PFO/ASD, mild to moderate Nathaniel w/ moderate MAC, AoV without stenosis/regurgitation - TEE (10/23): EF 45%, RV mildly decreased, no LAA thrombus, no PFO/ASD, moderate Nathaniel, AoV without stenosis/regurgitation, mean gradient 6 mmHg. 14. BPH  15. Nonsmall cell lung cancer: Suspected by imaging, treated in 2023 with radiation.  16. Fall with rhabdomyolysis (10/22)   SH: Widower, 2 children, raised his 3 grand-daughters, lives in [place], [place].  Occasional ETOH, no smoking.   FH: Father died from ruptured AAA.   ROS: All systems reviewed and negative except as per HPI.   Current Outpatient Medications  Medication Sig Dispense Refill   atorvastatin (LIPITOR) 20 MG tablet Take 1 tablet (20 mg total) by mouth daily. Alternating with 30mg  30 tablet 5   Coenzyme Q10 (CO Q10) 200 MG CAPS Take 200 mg by mouth daily.     dofetilide (TIKOSYN) 250 MCG capsule TAKE ONE CAPSULE TWICE A DAY (Patient taking differently: Take 250 mcg by mouth at bedtime.) 60 capsule 11   dorzolamide-timolol (COSOPT) 22.3-6.8 MG/ML ophthalmic solution Place 1 drop into  both eyes every evening.     ezetimibe (ZETIA) 10 MG tablet Take 1 tablet (10 mg total) by mouth at bedtime. 90 tablet 3   furosemide (LASIX) 20 MG tablet Patient takes 2 tablets by mouth in the morning and takes 1 tablet at night.     latanoprost (XALATAN) 0.005 % ophthalmic solution Place 1 drop into the left eye at bedtime.     METAMUCIL FIBER PO Take 1 Dose by mouth 2 (two) times a week.     metoprolol succinate (TOPROL-XL) 50 MG 24 hr tablet Take 1 tablet (50 mg total) by mouth daily. Take with or immediately following a meal. 90 tablet 3   Misc Natural Products (GLUCOSAMINE CHONDROITIN TRIPLE) TABS Take 1 tablet by mouth daily.     ramipril (ALTACE) 2.5 MG capsule Take 1 capsule (2.5 mg total) by mouth daily. (Patient taking differently:  Take 2.5 mg by mouth 2 (two) times daily.) 90 capsule 3   REPATHA SURECLICK 140 MG/ML SOAJ INJECT 1 PEN INTO SKIN EVERY 14 DAYS 2 mL 11   spironolactone (ALDACTONE) 25 MG tablet Take 1 tablet (25 mg total) by mouth daily. 30 tablet 5   tamsulosin (FLOMAX) 0.4 MG CAPS capsule TAKE ONE CAPSULE BY MOUTH TWICE DAILY 180 capsule 3   warfarin (COUMADIN) 3 MG tablet Tomorrow (9/12) take 2 tablets ( ) Starting 9/13 take 3 tablets ( ) daily or as directed by [facility] (Patient taking differently: Take 9 mg by mouth daily. Patient takes 6 mg on Sunday.) 90 tablet 2   No current facility-administered medications for this encounter.   BP 104/80   Pulse 86   Wt 84 kg (185 lb 3.2 oz)   SpO2 95%   BMI 26.57 kg/m    Wt Readings from Last 3 Encounters:  04/08/23 84 kg (185 lb 3.2 oz)  01/08/23 86 kg (189 lb 9.6 oz)  10/20/22 85.1 kg (187 lb 9.6 oz)   Physical Exam: General:  elderly appearing.  No respiratory difficulty. Walked into clinic HEENT: normal Neck: supple. JVD ~7 cm. Carotids 2+ bilat; no bruits. No lymphadenopathy or thyromegaly appreciated. Cor: PMI nondisplaced. Regular rate & irregular rhythm. No rubs, gallops or murmurs. Lungs: clear, diminished bases R>L Abdomen: soft, nontender, distended. No hepatosplenomegaly. No bruits or masses. Good bowel sounds. Extremities: no cyanosis, clubbing, rash, trace BLE edema  Neuro: alert & oriented x 3, cranial nerves grossly intact. moves all 4 extremities w/o difficulty. Affect pleasant.   ReDS clip: 41 %  Assessment/Plan:  1.  Chronic HF with mid-range EF: Echo 09/23 with stable EF 45-50%, LV global hypokinesis, RV systolic function normal. Filling pressures normal on RHC (9/23). TEE/DCCV (10/23) showed EF 45%, diffuse HK, RV mildly decreased, moderate Nathaniel, Bioprosthetic AoV stable with mean gradient 6 mmHg, no LAA. NYHA III, does not appear volume overloaded on assessment, ReDS clip: 41 % - Continue Toprol XL 50 mg daily. - Was doing  lasix 40 am/ 20 pm. Will increase to 40 BID. BMET and BNP today. Repeat BMET in 1 week.  - Continue ramipril 2.5 mg bid.  - Continue spironolactone 25 mg daily. - Off Jardiance with recent E coli UTI.  - check echo to reassess LV with function with persistent atrial fibrillation  2.  CAD: s/p CABG and Redo CABG. LHC 06/2014 showed significant coronary disease w/ no good interventional targets (LIMA-LAD patent with 40-50% stenosis in LAD after touchdown, sequential SVG-ramus/OM/diagonal with only the ramus branch still intact (known from prior cath), SVG-PDA from  original surgery TO, SVG-PDA from redo surgery with long 50-60% mid-graft stenosis). NSTEMI 9/23, repeat LHC showed occlusion of the 2nd SVG-PDA and 99% stenosis in the SVG-ramus. Dr. Shirlee [name] reviewed with Nathaniel [name], probably not much benefit to intervention on SVG-ramus.  Small territory covered and complex disease in the SVG.  Plan medical management. No chest pain. - Now off ASA. - Continue atorvastatin 20 mg QOD + Zetia.  - Continue Repatha. LDL excellent at 26.   3. Atrial fibrillation: Difficult to control, failed multiple cardioversions.  Failed amiodarone due to CNS SE, feels strongly against trying again. Offered ablation by EP but he declined. Had been primarily in NSR on Tikosyn.  In setting of NSTEMI, he developed recurrent AF. S/p TEE/DCCV 9//23 and 10/23 with successful conversion to NSR. Unfortunately back in atrial fibrillation. Will now focus on rate control strategy - Continue Toprol as above.  - Off ASA with warfarin. INR followed by PCP.   4. NSCLC RUL: Completed SBRT.  Followed by radiation oncology. Followup CT in 8/23 reviewed with pulmonary, no acute changes  5. Bioprosthetic AVR: Stable on recent echo and TEE.  6. Type 2DM: Recent A1c 7.7 - off SLGT2i given UTIs.  - managed by PCP  7. PAD:  AAA s/p EVR, LE PAD + Carotid Artery Disease s/p Rt CEA. No current claudication symptoms.  - Followed by Dr.  Myra Gianotti. - Continue lipid lowering agents per above.   Repeat BMET 1 week. Follow up in 6-8 weeks with Dr. Shirlee [name] + echo .  Brynda Peon, AGACNP-BC  04/08/2023

## 2023-04-08 NOTE — Patient Instructions (Addendum)
Thank you for coming in today  Labs were done today, if any labs are abnormal the clinic will call you No news is good news  Medications: Increase lasix to  twice a day  Follow up appointments:  Your physician recommends that you schedule a follow-up appointment in:  4-6 weeks with echocardiogram with Dr. Earlean Shawl will receive a reminder letter in the mail a few months in advance. If you don't receive a letter, please call our office to schedule the follow-up appointment.  Your physician has requested that you have an echocardiogram. Echocardiography is a painless test that uses sound waves to create images of your heart. It provides your doctor with information about the size and shape of your heart and how well your heart's chambers and valves are working. This procedure takes approximately one hour. There are no restrictions for this procedure.       Do the following things EVERYDAY: Weigh yourself in the morning before breakfast. Write it down and keep it in a log. Take your medicines as prescribed Eat low salt foods--Limit salt (sodium) to 2000 mg per day.  Stay as active as you can everyday Limit all fluids for the day to less than 2 liters   At the [facility], you and your health needs are our priority. As part of our continuing mission to provide you with exceptional heart care, we have created designated Provider Care Teams. These Care Teams include your primary Cardiologist (physician) and Advanced Practice Providers (APPs- Physician Assistants and Nurse Practitioners) who all work together to provide you with the care you need, when you need it.   You may see any of the following providers on your designated Care Team at your next follow up: Dr Arvilla Meres Dr [name] Dr. Marcos Eke, NP Robbie Lis, [place] [facility] [place], [place] Brynda Peon, NP Karle Plumber, PharmD   Please be sure to bring in all your  medications bottles to every appointment.    Thank you for choosing [facility]  If you have any questions or concerns before your next appointment please send [place] a message through [place] or call our office at [phone].    TO LEAVE A MESSAGE FOR THE NURSE SELECT OPTION 2, PLEASE LEAVE A MESSAGE INCLUDING: YOUR NAME DATE OF BIRTH CALL BACK NUMBER REASON FOR CALL**this is important as we prioritize the call backs  YOU WILL RECEIVE A CALL BACK THE SAME DAY AS LONG AS YOU CALL BEFORE 4:00 PM

## 2023-04-08 NOTE — Progress Notes (Signed)
ReDS Vest / Clip - 04/08/23 1238       ReDS Vest / Clip   Station Marker C    Ruler Value 30    ReDS Value Range High volume overload    ReDS Actual Value 41    Anatomical Comments sitting

## 2023-04-12 ENCOUNTER — Telehealth (HOSPITAL_COMMUNITY): Payer: Self-pay | Admitting: Vascular Surgery

## 2023-04-12 NOTE — Telephone Encounter (Signed)
Pt would like [facility] only to call him he has a serious issue/ he would not disclose what this issue is/ please advise

## 2023-04-13 ENCOUNTER — Ambulatory Visit (HOSPITAL_COMMUNITY)
Admission: RE | Admit: 2023-04-13 | Discharge: 2023-04-13 | Disposition: A | Discharge status: Home / Self Care | Payer: Medicare Other | Source: Ambulatory Visit | Attending: Cardiology | Admitting: Cardiology

## 2023-04-13 VITALS — BP 98/56 | HR 86 | Wt 184.1 lb

## 2023-04-13 DIAGNOSIS — Z87440 Personal history of urinary (tract) infections: Secondary | ICD-10-CM | POA: Diagnosis not present

## 2023-04-13 DIAGNOSIS — I5022 Chronic systolic (congestive) heart failure: Secondary | ICD-10-CM | POA: Insufficient documentation

## 2023-04-13 DIAGNOSIS — I110 Hypertensive heart disease with heart failure: Secondary | ICD-10-CM | POA: Insufficient documentation

## 2023-04-13 DIAGNOSIS — Z7901 Long term (current) use of anticoagulants: Secondary | ICD-10-CM | POA: Diagnosis not present

## 2023-04-13 DIAGNOSIS — I4891 Unspecified atrial fibrillation: Secondary | ICD-10-CM | POA: Diagnosis not present

## 2023-04-13 DIAGNOSIS — Z923 Personal history of irradiation: Secondary | ICD-10-CM | POA: Insufficient documentation

## 2023-04-13 DIAGNOSIS — I2510 Atherosclerotic heart disease of native coronary artery without angina pectoris: Secondary | ICD-10-CM | POA: Diagnosis not present

## 2023-04-13 DIAGNOSIS — I25810 Atherosclerosis of coronary artery bypass graft(s) without angina pectoris: Secondary | ICD-10-CM | POA: Insufficient documentation

## 2023-04-13 DIAGNOSIS — I714 Abdominal aortic aneurysm, without rupture, unspecified: Secondary | ICD-10-CM | POA: Diagnosis not present

## 2023-04-13 DIAGNOSIS — Z953 Presence of xenogenic heart valve: Secondary | ICD-10-CM | POA: Diagnosis not present

## 2023-04-13 DIAGNOSIS — Z79899 Other long term (current) drug therapy: Secondary | ICD-10-CM | POA: Diagnosis not present

## 2023-04-13 DIAGNOSIS — C3490 Malignant neoplasm of unspecified part of unspecified bronchus or lung: Secondary | ICD-10-CM | POA: Diagnosis not present

## 2023-04-13 DIAGNOSIS — Z8774 Personal history of (corrected) congenital malformations of heart and circulatory system: Secondary | ICD-10-CM | POA: Insufficient documentation

## 2023-04-13 DIAGNOSIS — I5042 Chronic combined systolic (congestive) and diastolic (congestive) heart failure: Secondary | ICD-10-CM

## 2023-04-13 DIAGNOSIS — I480 Paroxysmal atrial fibrillation: Secondary | ICD-10-CM | POA: Diagnosis not present

## 2023-04-13 DIAGNOSIS — I4821 Permanent atrial fibrillation: Secondary | ICD-10-CM

## 2023-04-13 DIAGNOSIS — I5032 Chronic diastolic (congestive) heart failure: Secondary | ICD-10-CM

## 2023-04-13 DIAGNOSIS — I252 Old myocardial infarction: Secondary | ICD-10-CM | POA: Insufficient documentation

## 2023-04-13 LAB — BASIC METABOLIC PANEL
Anion gap: 11 (ref 5–15)
BUN: 24 mg/dL — ABNORMAL HIGH (ref 8–23)
CO2: 28 mmol/L (ref 22–32)
Calcium: 9.2 mg/dL (ref 8.9–10.3)
Chloride: 96 mmol/L — ABNORMAL LOW (ref 98–111)
Creatinine, Ser: 1.33 mg/dL — ABNORMAL HIGH (ref 0.61–1.24)
GFR, Estimated: 51 mL/min — ABNORMAL LOW (ref >60)
Glucose, Bld: 138 mg/dL — ABNORMAL HIGH (ref 70–99)
Potassium: 4.2 mmol/L (ref 3.5–5.1)
Sodium: 135 mmol/L (ref 135–145)

## 2023-04-13 LAB — BRAIN NATRIURETIC PEPTIDE: B Natriuretic Peptide: 726.1 pg/mL — ABNORMAL HIGH (ref 0.0–100.0)

## 2023-04-13 MED ORDER — RAMIPRIL 2.5 MG PO CAPS: 2.5000 mg | ORAL_CAPSULE | Freq: Every day | ORAL | 3 refills | Status: DC

## 2023-04-13 MED ORDER — METOPROLOL SUCCINATE ER 50 MG PO TB24: 50.0000 mg | ORAL_TABLET | Freq: Two times a day (BID) | ORAL | 3 refills | Status: DC

## 2023-04-13 MED ORDER — TORSEMIDE 20 MG PO TABS: ORAL_TABLET | ORAL | 3 refills | Status: DC

## 2023-04-13 NOTE — Patient Instructions (Signed)
Medication Changes:  STOP Tikosyn (Dofetilide)  STOP Furosemide  DECREASE Ramipril to 2.5 mg Daily  INCREASE Metoprolol XL to 50 mg Twice daily   START Torsemide 40 mg (2 tabs) Twice daily FOR 2 DAYS ONLY, then take 40 mg (2 tabs) in AM and 20 mg (1 tab) in PM  Lab Work:  Labs done today, we will call you for abnormal results  Testing/Procedures:  Your physician has requested that you have an echocardiogram. Echocardiography is a painless test that uses sound waves to create images of your heart. It provides your doctor with information about the size and shape of your heart and how well your heart's chambers and valves are working. This procedure takes approximately one hour. There are no restrictions for this procedure. Please do NOT wear cologne, perfume, aftershave, or lotions (deodorant is allowed). Please arrive 15 minutes prior to your appointment time.   Special Instructions // Education:  Do the following things EVERYDAY: Weigh yourself in the morning before breakfast. Write it down and keep it in a log. Take your medicines as prescribed Eat low salt foods--Limit salt (sodium) to 2000 mg per day.  Stay as active as you can everyday Limit all fluids for the day to less than 2 liters   Follow-Up in: 1 week  At the [facility], you and your health needs are our priority. We have a designated team specialized in the treatment of Heart Failure. This Care Team includes your primary Heart Failure Specialized Cardiologist (physician), Advanced Practice Providers (APPs- Physician Assistants and Nurse Practitioners), and Pharmacist who all work together to provide you with the care you need, when you need it.   You may see any of the following providers on your designated Care Team at your next follow up:  Dr. Arvilla Meres Dr. [name] Dr. Marcos Eke, NP Robbie Lis, [place] [facility] [place], [place] Brynda Peon,  NP Karle Plumber, PharmD   Please be sure to bring in all your medications bottles to every appointment.   Need to Contact us:  If you have any questions or concerns before your next appointment please send [place] a message through [place] or call our office at [phone].    TO LEAVE A MESSAGE FOR THE NURSE SELECT OPTION 2, PLEASE LEAVE A MESSAGE INCLUDING: YOUR NAME DATE OF BIRTH CALL BACK NUMBER REASON FOR CALL**this is important as we prioritize the call backs  YOU WILL RECEIVE A CALL BACK THE SAME DAY AS LONG AS YOU CALL BEFORE 4:00 PM

## 2023-04-13 NOTE — Telephone Encounter (Signed)
Patient called back to confirm appointment for labs and garage code.   He would like for Dr. Shirlee Latch to call him. He states he is having increased shortness of breath. He wants to know what he can do about this issue. He states it is worse on exertion. He denies any swelling or weight gain.  He does not want to speak to nurses. He wants to talk to Dr. Shirlee Latch.

## 2023-04-13 NOTE — Telephone Encounter (Signed)
Scheduled patiet to be seen

## 2023-04-13 NOTE — Progress Notes (Signed)
Patient ID: Nathaniel [name]., male   DOB: 1932-06-06, 89 y.o.   MRN: [id number] PCP: Dr. Leslie Dales Cardiology: Dr. Shirlee Latch  89 y.o. with history of extensive vascular disease including CAD s/p CABG and redo CABG, AAA s/p endovascular repair in 2015, PAD, and carotid stenosis as well as prior AVR.                             After his AAA repair, he was noted to be in atrial fibrillation.  He had had a prior episode of atrial fibrillation after his cardiac surgery in 2007.  Atrial fibrillation was rate-controlled.  He was started on warfarin and Toprol XL.  He developed exertional dyspnea and fatigue and was cardioverted back to SR 06/06/14.  Echo 06/15 EF worsened to 25-30% with stable bioprosthetic aortic valve, mildly dilated RV with normal systolic function.  LHC 07/15 showed stable anatomy.  The LIMA-LAD was patent, the sequential SVG-OM/ramus/D was patent only to ramus but this is the same as the prior cath, and the SVG-PDA was patent with a long 50-60% mid-graft stenosis.  No intervention.  Of note, at cath he was noted to be back in atrial fibrillation.  He was started on amiodarone and had DCCV again on 08/21/14.  This was successful.  He has been off amiodarone due to side effects (hallucinations).  Echo in 1/16 showed recovery of EF to 55-60%.    He was admitted in 1/19 with bronchitis/wheezing/dyspnea and was found to be in atrial fibrillation with RVR.  He tested positive for metapneumovirus.  Echo showed EF 45-50% with normal  bioprosthetic aortic valve. He was diuresed for volume overload while in the hospital. He had DCCV to NSR.    Carotid dopplers in 1/19 showed 80-99% RICA stenosis, he had right CEA in 2/19 with no complications.   At appointment in early 6/20, Nathaniel [name] was noted to be back in atrial fibrillation with RVR and mild CHF.  He was started on Toprol XL and Lasix was increased.  He had TEE-guided DCCV to NSR in 6/20.  TEE showed EF 50%, mild diffuse hypokinesis, mildly decreased RV systolic function, normally functioning bioprosthetic aortic valve.   In 2021, he was admitted for Tikosyn initiation.  In 12/21, he went into atrial fibrillation again and was cardioverted back to NSR.   Echo in 1/22 showed EF 45-50%, diffuse hypokinesis, mildly decreased RV systolic function, moderate biatrial enlargement, bioprosthetic valve with mean gradient 8 mmHg.    In 2/22, he went into atrial fibrillation and had TEE-guided DCCV back to NSR.  TEE showed EF 45-50%.    In 3/22, he was back in the ER with atrial fibrillation triggered by [name] most likely.  He had a cardioversion back to NSR.   In 10/22, patient developed urinary retention and an E coli UTI.  In the setting of this, he had a fall at home and was unable to get up due to weakness for hours.  He developed rhabdomyolysis.  This resolved with minimal renal damage with IV fluid hydration and he was discharged to rehab.   Follow up 2/23 he was in atrial fibrillation, TEE/DCCV arranged however INR subtherapeutic. He cancelled rescheduled procedure due to a death in the family.  Follow up 3/23. Remained in AF. Volume overloaded. Lasix increased for 3 days. Underwent TEE guided DCCV on 03/19/22.  TEE showed EF 50-55%, mildly decreased RV function, normal bioprosthetic aortic valve, mild Nathaniel and  mild mitral stenosis (mean gradient 3 mmHg).    Recently diagnosed with stage IA2 NSCLC RUL. Saw Radiation Oncology and Pulmonary and decided on empiric treatment  with SBRT.  He has completed radiation.   Admitted 9/23 with NSTEMI and AF with RVR. Underwent R/LHC showing normal filling pressures, known occlusion of native coronaries and SVG-RCA from original CABG. New occlusion of SVG-PDA from CABG#2. Long 99% stenosis in the SVG-ramus. Ramus is relatively small vessel. Patent LIMA-LAD with collaterals to RCA territory. Case was discussed with Dr. Lynnette Caffey, wouldn't benefit from intervention, medically managed. Patient declined re-challenge of amiodarone and was able to have successful TEE/DCCV to NSR. TEE showed EF 45% with normal RV, mild-moderate Nathaniel, normal bioprosthetic aortic valve, no LA appendage thrombus. Discharged home, weight 184 lbs.  Follow up 9/23, back in atrial fibrillation with HR 116. Toprol increased to 25 bid and arranged for TEE/DCCV.  TEE/DCCV (10/23) showed EF 45%, diffuse HK, RV mildly decreased, moderate Nathaniel, Bioprosthetic AoV stable with mean gradient 6 mmHg, no LAA. Proceeded with successful TEE to NSR.  However, later in 10/23 he was back in AF and has been in AF since that time.   Patient was seen for a work-in visit today due to worsening exertional dyspnea. He remains in atrial fibrillation with HR in 100s.  He is becoming progressively more short of breath.  He has to stop 4 times now walking about 100 yards to the dining room at [employer].  A few weeks ago, he only had to stop once.  He is short of breath showering and with most moderate exertion.  He has bendopnea.  He chronically sleeps on 2 pillows.  He does not feel palpitations.  No chest pain.  No lightheadedness.   ECG (personally reviewed): atrial fibrillation rate 106, LBBB-like IVCD 156 msec  Labs (1/19): K 4.9, creatinine 1.19 Labs (2/19): K 4.2, creatinine 0.89 Labs (3/19): LDL 86, HDL 45, TGs 245 Labs (5/19): LDL 110, HDL 39 Labs (6/19): K 4.1, creatinine 1.07 Labs (11/19): LDL 14, HDL 56 Labs (6/20): K 4.1, creatinine 1.0 Labs (7/20): K 4.3, creatinine  1.07 Labs (9/21): LDL 129, hgb 14.8, K 4.1, creatinine 1.02 Labs (12/21): LDL 20, HDL 46, K 4.4, creatinine 6.96 Labs (1/22): LDL 45, HDL 49, K 4.3, creatinine 1.1, BNP 184 Labs (3/22): K 4.4, creatinine 1.10, LDL 15, HDL 49 Labs (6/22): K 4.3, creatinine 1.11, LDL 121 Labs (7/22): K 4.3, creatinine 1.23, hgb 14.5 Labs (11/22): K 4.5, creatinine 1.14 Labs (2/23): K 4.3, creatinine 1.16 Labs (7/23): K 4.4, creatinine 1.24 Labs (9/23): K 4.1, creatinine 1.19 Labs (10/23): K 4.2, creatinine 1.31 Labs (1/24): K 4.8, SCr 1.13, Hgb A1c 7.7 Labs (4/24): K 5, creatinine 1.56  PMH: 1. PAD: Peripheral arterial dopplers in 2012 showed > 75% bilateral SFA stenosis. Peripheral arterial dopplers in 10/14 showed > 50% focal bilateral SFA stenoses.  2. AAA: [place] 4/15 with 4.4 cm AAA but concern for penetrating ulcers. CTA abdomen showed 4.4 cm AAA with penetrating ulcers and possible pseudoaneurysms. Now s/p endovascular repair of AAA in 5/15 with no complications.  3. Carotid stenosis: Carotid dopplers (4/15) with 60-79% bilateral ICA stenosis. Carotid dopplers (11/15) with 60-79% bilateral ICA stenosis.  Carotid dopplers 6/16 with 60-79% bilateral ICA stenosis. - Carotid dopplers (1/18): 60-79% RICA stenosis.  - Carotid dopplers (1/19): 80-99% RICA stenosis => Right CEA in 2/19.  - Carotid dopplers (11/19): 40-59% bilateral stenosis.  - Carotid dopplers (1/21): 1-39% BICA stenosis.  4. CAD: CABG 1989  with LIMA-LAD, SVG-D, seq SVG-ramus and OM, sequential SVG-PDA/PLV. SVG-PDA/PLV found to be occluded on cath prior to AVR, so patient had SVG-PDA with AVR in 2007. LHC (7/15) with LIMA-LAD patent with 40-50% stenosis in LAD after touchdown, sequential SVG-ramus/OM/diagonal with only the ramus branch still intact (known from prior cath), SVG-PDA from original surgery TO, SVG-PDA from redo surgery with long 50-60% mid-graft stenosis.  No target for intervention.  - LHC (9/23): known occlusion of native coronaries  and SVG-RCA from original CABG, new occlusion of SVG-PDA from CABG#2, long 99% stenosis in the SVG-ramus (was sequential SVG-ramus/OM/D but OM and D branches noted to be lost on prior cath), ramus is relatively small vessel, patent LIMA-LAD with collaterals to RCA territory. Case reviewed with Dr. Lynnette Caffey, probably not much benefit to intervention on SVG-ramus. Small territory covered and complex disease in SVG. Medical management advised. 5. Severe aortic stenosis: Bioprosthetic AVR in 2007.  6. Atrial fibrillation: Paroxysmal. Initially noted after cardiac surgery, then again after AAA repair.  DCCV to NSR 06/06/14.  DCCV to NSR again 08/21/14.  - DCCV to NSR in 1/19.  - DCCV to NSR in 6/20.  - Now on Tikosyn.  - DCCV to NSR 12/21.  - DCCV to NSR 2/22. - DCCV to NSR 3/22. - DCCV to NSR 4/23. - DCCV to NSR 9/23. - DCCV to NSR 10/23 7. Type II diabetes  8. Gout  9. GERD  10. Hyperlipidemia  11. HTN 12. GERD  13. Chronic systolic CHF: Echo (6/15) with EF worsened to 25-30% with grade II diastolic dysfunction, stable bioprosthetic aortic valve, mildly dilated RV with normal systolic function.  Echo (1/16) with EF 55-60%, basal inferior hypokinesis, mild LVH, bioprosthetic aortic valve with mean gradient 13 mmHg, mild Nathaniel, severe LAE.  - Echo (1/19): EF 45-50%, mild Nathaniel, normal bioprosthetic aortic valve.  - TEE (6/20): EF 50% with mild diffuse hypokinesis, mildly decreased RV systolic function with mild RV enlargement, bioprosthetic aortic valve looked ok, mild Nathaniel. - Echo (1/22):  EF 45-50%, diffuse hypokinesis, mildly decreased RV systolic function, moderate biatrial enlargement, bioprosthetic valve with mean gradient 8 mmHg.   - TEE (2/22): EF 45-50%, RV normal, moderate biatrial enlargement, bioprosthetic AoV with mean gradient 6 mmHg, mild Nathaniel.  - TEE (4/23): EF 50-55%, mildly decreased RV function, normal bioprosthetic aortic valve, mild Nathaniel and mild mitral stenosis (mean gradient 3 mmHg) - TEE  (9/23): EF 45%, RV normal, no LAA thrombus, no PFO/ASD, mild to moderate Nathaniel w/ moderate MAC, AoV without stenosis/regurgitation - TEE (10/23): EF 45%, RV mildly decreased, no LAA thrombus, no PFO/ASD, moderate Nathaniel, AoV without stenosis/regurgitation, mean gradient 6 mmHg. 14. BPH  15. Nonsmall cell lung cancer: Suspected by imaging, treated in 2023 with radiation.  16. Fall with rhabdomyolysis (10/22)   SH: Widower, 2 children, raised his 3 grand-daughters, lives in [place], [place].  Occasional ETOH, no smoking.   FH: Father died from ruptured AAA.   ROS: All systems reviewed and negative except as per HPI.   Current Outpatient Medications  Medication Sig Dispense Refill   atorvastatin (LIPITOR) 20 MG tablet Take 1 tablet (20 mg total) by mouth daily. Alternating with 30mg  30 tablet 5   Coenzyme Q10 (CO Q10) 200 MG CAPS Take 200 mg by mouth daily.     dorzolamide-timolol (COSOPT) 22.3-6.8 MG/ML ophthalmic solution Place 1 drop into both eyes every evening.     ezetimibe (ZETIA) 10 MG tablet Take 1 tablet (10 mg total) by mouth at bedtime. 90  tablet 3   latanoprost (XALATAN) 0.005 % ophthalmic solution Place 1 drop into the left eye at bedtime.     METAMUCIL FIBER PO Take 1 Dose by mouth 2 (two) times a week.     Misc Natural Products (GLUCOSAMINE CHONDROITIN TRIPLE) TABS Take 1 tablet by mouth daily.     REPATHA SURECLICK 140 MG/ML SOAJ INJECT 1 PEN INTO SKIN EVERY 14 DAYS 2 mL 11   spironolactone (ALDACTONE) 25 MG tablet Take 1 tablet (25 mg total) by mouth daily. 30 tablet 5   tamsulosin (FLOMAX) 0.4 MG CAPS capsule TAKE ONE CAPSULE BY MOUTH TWICE DAILY 180 capsule 3   torsemide (DEMADEX) 20 MG tablet Take 2 tablets (40 mg total) by mouth every morning AND 1 tablet (20 mg total) every evening. 90 tablet 3   warfarin (COUMADIN) 3 MG tablet Tomorrow (9/12) take 2 tablets (6mg ) Starting 9/13 take 3 tablets (9mg ) daily or as directed by [facility] (Patient taking differently: Take 9  mg by mouth daily. Patient takes 6 mg on Sunday.) 90 tablet 2   metoprolol succinate (TOPROL-XL) 50 MG 24 hr tablet Take 1 tablet (50 mg total) by mouth in the morning and at bedtime. Take with or immediately following a meal. 60 tablet 3   ramipril (ALTACE) 2.5 MG capsule Take 1 capsule (2.5 mg total) by mouth daily. 30 capsule 3   No current facility-administered medications for this encounter.   BP (!) 98/56   Pulse 86   Wt 83.5 kg (184 lb 2 oz)   SpO2 94%   BMI 26.42 kg/m    Wt Readings from Last 3 Encounters:  04/13/23 83.5 kg (184 lb 2 oz)  04/08/23 84 kg (185 lb 3.2 oz)  01/08/23 86 kg (189 lb 9.6 oz)   Physical Exam: General: NAD Neck: JVP 10 cm with HJR, no thyromegaly or thyroid nodule.  Lungs: Mild crackles at bases.  CV: Nondisplaced PMI.  Heart mildly tachy, irregular S1/S2, no S3/S4, 1/6 SEM RUSB.  1+ ankle edema.  No carotid bruit.  Normal pedal pulses.  Abdomen: Soft, nontender, no hepatosplenomegaly, no distention.  Skin: Intact without lesions or rashes.  Neurologic: Alert and oriented x 3.  Psych: Normal affect. Extremities: No clubbing or cyanosis.  HEENT: Normal.   Assessment/Plan:  1.  Chronic HF with mid-range EF: Echo 09/23 with stable EF 45-50%, LV global hypokinesis, RV systolic function normal. Filling pressures normal on RHC (9/23). TEE in 10/23 showed EF 45%, diffuse HK, RV mildly decreased, moderate Nathaniel, bioprosthetic AoV stable with mean gradient 6 mmHg, no LAA. He has become steadily more short of breath over the last few weeks to months.  He has been persistently in atrial fibrillation since the end of October.  I suspect that AF with RVR has been triggering the current CHF exacerbation. NYHA class III, he is volume overloaded on exam.  - Increase Toprol XL to 50 mg bid for better rate control.  - Decrease ramipril to 2.5 daily with soft BP and higher creatinine at 1.56.  - Stop Lasix, start torsemide 40 mg bid x 2 days then 40 qam/20 qpm after that.   BMET/BNP today, BMET in 1 week.   - Continue spironolactone 25 mg daily. - Off Jardiance with recent E coli UTI.  - Repeat echo has been ordered.  2.  CAD: s/p CABG and Redo CABG. LHC 06/2014 showed significant coronary disease w/ no good interventional targets (LIMA-LAD patent with 40-50% stenosis in LAD after touchdown, sequential  SVG-ramus/OM/diagonal with only the ramus branch still intact (known from prior cath), SVG-PDA from original surgery TO, SVG-PDA from redo surgery with long 50-60% mid-graft stenosis). NSTEMI 9/23, repeat LHC showed occlusion of the 2nd SVG-PDA and 99% stenosis in the SVG-ramus.  Probably not much benefit to intervention on SVG-ramus.  Small territory covered and complex disease in the SVG.  Plan medical management. No further chest pain. - Now off ASA with warfarin use. - Continue atorvastatin 20 mg QOD + Zetia.  - Continue Repatha.  3. Atrial fibrillation: Difficult to control, failed multiple cardioversions.  Failed amiodarone due to CNS side effects, feels strongly against trying again. Offered ablation years ago by EP but he declined. Had been primarily in NSR on Tikosyn but now failing Tikosyn.  In setting of NSTEMI, he developed recurrent AF. S/p TEE/DCCV 9/23 and 10/23 with successful conversion to NSR initially but then back to AF. He saw Dr. Elberta Fortis who did not think he was a candidate for AF ablation at this point.  He remains in AF with RVR today, suspect this has triggered his worsening heart failure.   - Failing Tikosyn, unable to hold NSR with cardioversions.  I will stop Tikosyn.  - Continue warfarin.  - Increase Toprol XL to 50 mg bid for better rate control.  - If unable to control rate, would favor AV nodal ablation with BiV or left bundle pacing (has wide LBBB-like IVCD).  4. NSCLC: Completed SBRT.  Followed by radiation oncology. Followup CT in 8/23 reviewed with pulmonary, no acute changes 5. Bioprosthetic AVR: Stable on recent echo and TEE. 6. Type  2DM:  - off SLGT2i given UTIs.  - managed by PCP 7. PAD:  AAA s/p EVR, LE PAD + Carotid Artery Disease s/p Rt CEA. No current claudication symptoms.  - Followed by Dr. Myra Gianotti. - Continue lipid lowering agents per above.   Followup with me in 1 week.   [name]  04/13/2023

## 2023-04-13 NOTE — Telephone Encounter (Signed)
Get him an appointment with me this afternoon.  If he does not want this and just wants me to call him, that is fine too.

## 2023-04-15 ENCOUNTER — Other Ambulatory Visit (HOSPITAL_COMMUNITY): Payer: Medicare Other

## 2023-04-21 DIAGNOSIS — H401122 Primary open-angle glaucoma, left eye, moderate stage: Secondary | ICD-10-CM | POA: Diagnosis not present

## 2023-04-21 LAB — HM DIABETES EYE EXAM

## 2023-04-23 ENCOUNTER — Ambulatory Visit (HOSPITAL_COMMUNITY)
Admission: RE | Admit: 2023-04-23 | Discharge: 2023-04-23 | Disposition: A | Discharge status: Home / Self Care | Payer: Medicare Other | Source: Ambulatory Visit | Attending: Cardiology | Admitting: Cardiology

## 2023-04-23 ENCOUNTER — Telehealth: Payer: Self-pay | Admitting: Physician Assistant

## 2023-04-23 ENCOUNTER — Other Ambulatory Visit: Payer: Self-pay | Admitting: Physician Assistant

## 2023-04-23 ENCOUNTER — Encounter (HOSPITAL_COMMUNITY): Payer: Self-pay | Admitting: Cardiology

## 2023-04-23 VITALS — BP 90/50 | HR 76 | Wt 182.0 lb

## 2023-04-23 DIAGNOSIS — I25810 Atherosclerosis of coronary artery bypass graft(s) without angina pectoris: Secondary | ICD-10-CM | POA: Insufficient documentation

## 2023-04-23 DIAGNOSIS — Z953 Presence of xenogenic heart valve: Secondary | ICD-10-CM | POA: Insufficient documentation

## 2023-04-23 DIAGNOSIS — Z7901 Long term (current) use of anticoagulants: Secondary | ICD-10-CM | POA: Insufficient documentation

## 2023-04-23 DIAGNOSIS — Z923 Personal history of irradiation: Secondary | ICD-10-CM | POA: Diagnosis not present

## 2023-04-23 DIAGNOSIS — I2510 Atherosclerotic heart disease of native coronary artery without angina pectoris: Secondary | ICD-10-CM | POA: Diagnosis not present

## 2023-04-23 DIAGNOSIS — I4891 Unspecified atrial fibrillation: Secondary | ICD-10-CM

## 2023-04-23 DIAGNOSIS — I714 Abdominal aortic aneurysm, without rupture, unspecified: Secondary | ICD-10-CM | POA: Diagnosis not present

## 2023-04-23 DIAGNOSIS — I5042 Chronic combined systolic (congestive) and diastolic (congestive) heart failure: Secondary | ICD-10-CM

## 2023-04-23 DIAGNOSIS — Z79899 Other long term (current) drug therapy: Secondary | ICD-10-CM | POA: Diagnosis not present

## 2023-04-23 DIAGNOSIS — I110 Hypertensive heart disease with heart failure: Secondary | ICD-10-CM | POA: Insufficient documentation

## 2023-04-23 DIAGNOSIS — I252 Old myocardial infarction: Secondary | ICD-10-CM | POA: Diagnosis not present

## 2023-04-23 DIAGNOSIS — I480 Paroxysmal atrial fibrillation: Secondary | ICD-10-CM | POA: Insufficient documentation

## 2023-04-23 DIAGNOSIS — C3490 Malignant neoplasm of unspecified part of unspecified bronchus or lung: Secondary | ICD-10-CM | POA: Insufficient documentation

## 2023-04-23 LAB — BRAIN NATRIURETIC PEPTIDE: B Natriuretic Peptide: 895.8 pg/mL — ABNORMAL HIGH (ref 0.0–100.0)

## 2023-04-23 MED ORDER — POTASSIUM CHLORIDE CRYS ER 20 MEQ PO TBCR: 20.0000 meq | EXTENDED_RELEASE_TABLET | Freq: Two times a day (BID) | ORAL | 2 refills | Status: DC

## 2023-04-23 MED ORDER — TORSEMIDE 20 MG PO TABS: ORAL_TABLET | ORAL | 5 refills | Status: DC

## 2023-04-23 NOTE — Patient Instructions (Signed)
Good to see you today!  CHANGE Torsemide to 60 mg in the am and 40 mg  in the evening  START Potassium 20 meq daily   Labs done today, your results will be available in MyChart, we will contact you for abnormal readings.  Lab work in 1 week as scheduled  Your physician recommends that you schedule a follow-up appointment in: 2 weeks with app clinic   Please wear compression stockings   If you have any questions or concerns before your next appointment please send [place] a message through [place] or call our office at [phone].    TO LEAVE A MESSAGE FOR THE NURSE SELECT OPTION 2, PLEASE LEAVE A MESSAGE INCLUDING: YOUR NAME DATE OF BIRTH CALL BACK NUMBER REASON FOR CALL**this is important as we prioritize the call backs  YOU WILL RECEIVE A CALL BACK THE SAME DAY AS LONG AS YOU CALL BEFORE 4:00 PM  At the [facility], you and your health needs are our priority. As part of our continuing mission to provide you with exceptional heart care, we have created designated Provider Care Teams. These Care Teams include your primary Cardiologist (physician) and Advanced Practice Providers (APPs- Physician Assistants and Nurse Practitioners) who all work together to provide you with the care you need, when you need it.   You may see any of the following providers on your designated Care Team at your next follow up: Dr Arvilla Meres Dr [name] Dr. Marcos Eke, NP Robbie Lis, [place] [facility] [place], [place] Brynda Peon, NP Karle Plumber, PharmD   Please be sure to bring in all your medications bottles to every appointment.    Thank you for choosing [facility]

## 2023-04-23 NOTE — Telephone Encounter (Signed)
Paged by Sheron Nightingale [facility] main lab  [phone]) who reported today's lab was grossly hemolyzed and need to recollect.  Will reorder basic metabolic panel and BNP and informed the patient to return next Monday to obtain the same lab work.

## 2023-04-23 NOTE — Telephone Encounter (Signed)
I attempted to contact the patient regarding the need to repeat a basic metabolic panel. (Initially was told BNP need to be repeated as well, however it appears BMP has resulted.)  Unable to reach the patient.  Please contact the patient on Monday to attempt to have him come to the [facility] lab to redraw the blood work, have the result sent to Dr. Shirlee Latch.

## 2023-04-23 NOTE — Progress Notes (Signed)
ReDS Vest / Clip - 04/23/23 1600       ReDS Vest / Clip   Station Marker C    Ruler Value 28    ReDS Value Range High volume overload    ReDS Actual Value 45

## 2023-04-24 NOTE — Progress Notes (Signed)
Patient ID: Cassandra Mcmanaman., male   DOB: 1932-06-06, 89 y.o.   MRN: [id number] PCP: Dr. Leslie Dales Cardiology: Dr. Shirlee Latch  89 y.o. with history of extensive vascular disease including CAD s/p CABG and redo CABG, AAA s/p endovascular repair in 2015, PAD, and carotid stenosis as well as prior AVR.                             After his AAA repair, he was noted to be in atrial fibrillation.  He had had a prior episode of atrial fibrillation after his cardiac surgery in 2007.  Atrial fibrillation was rate-controlled.  He was started on warfarin and Toprol XL.  He developed exertional dyspnea and fatigue and was cardioverted back to SR 06/06/14.  Echo 06/15 EF worsened to 25-30% with stable bioprosthetic aortic valve, mildly dilated RV with normal systolic function.  LHC 07/15 showed stable anatomy.  The LIMA-LAD was patent, the sequential SVG-OM/ramus/D was patent only to ramus but this is the same as the prior cath, and the SVG-PDA was patent with a long 50-60% mid-graft stenosis.  No intervention.  Of note, at cath he was noted to be back in atrial fibrillation.  He was started on amiodarone and had DCCV again on 08/21/14.  This was successful.  He has been off amiodarone due to side effects (hallucinations).  Echo in 1/16 showed recovery of EF to 55-60%.    He was admitted in 1/19 with bronchitis/wheezing/dyspnea and was found to be in atrial fibrillation with RVR.  He tested positive for metapneumovirus.  Echo showed EF 45-50% with normal  bioprosthetic aortic valve. He was diuresed for volume overload while in the hospital. He had DCCV to NSR.    Carotid dopplers in 1/19 showed 80-99% RICA stenosis, he had right CEA in 2/19 with no complications.   At appointment in early 6/20, [name] was noted to be back in atrial fibrillation with RVR and mild CHF.  He was started on Toprol XL and Lasix was increased.  He had TEE-guided DCCV to NSR in 6/20.  TEE showed EF 50%, mild diffuse hypokinesis, mildly decreased RV systolic function, normally functioning bioprosthetic aortic valve.   In 2021, he was admitted for Tikosyn initiation.  In 12/21, he went into atrial fibrillation again and was cardioverted back to NSR.   Echo in 1/22 showed EF 45-50%, diffuse hypokinesis, mildly decreased RV systolic function, moderate biatrial enlargement, bioprosthetic valve with mean gradient 8 mmHg.    In 2/22, he went into atrial fibrillation and had TEE-guided DCCV back to NSR.  TEE showed EF 45-50%.    In 3/22, he was back in the ER with atrial fibrillation triggered by [name] most likely.  He had a cardioversion back to NSR.   In 10/22, patient developed urinary retention and an E coli UTI.  In the setting of this, he had a fall at home and was unable to get up due to weakness for hours.  He developed rhabdomyolysis.  This resolved with minimal renal damage with IV fluid hydration and he was discharged to rehab.   Follow up 2/23 he was in atrial fibrillation, TEE/DCCV arranged however INR subtherapeutic. He cancelled rescheduled procedure due to a death in the family.  Follow up 3/23. Remained in AF. Volume overloaded. Lasix increased for 3 days. Underwent TEE guided DCCV on 03/19/22.  TEE showed EF 50-55%, mildly decreased RV function, normal bioprosthetic aortic valve, mild MR and  mild mitral stenosis (mean gradient 3 mmHg).    Recently diagnosed with stage IA2 NSCLC RUL. Saw Radiation Oncology and Pulmonary and decided on empiric treatment  with SBRT.  He has completed radiation.   Admitted 9/23 with NSTEMI and AF with RVR. Underwent R/LHC showing normal filling pressures, known occlusion of native coronaries and SVG-RCA from original CABG. New occlusion of SVG-PDA from CABG#2. Long 99% stenosis in the SVG-ramus. Ramus is relatively small vessel. Patent LIMA-LAD with collaterals to RCA territory. Case was discussed with Dr. Lynnette Caffey, wouldn't benefit from intervention, medically managed. Patient declined re-challenge of amiodarone and was able to have successful TEE/DCCV to NSR. TEE showed EF 45% with normal RV, mild-moderate MR, normal bioprosthetic aortic valve, no LA appendage thrombus. Discharged home, weight 184 lbs.  Follow up 9/23, back in atrial fibrillation with HR 116. Toprol increased to 25 bid and arranged for TEE/DCCV.  TEE/DCCV (10/23) showed EF 45%, diffuse HK, RV mildly decreased, moderate MR, Bioprosthetic AoV stable with mean gradient 6 mmHg, no LAA. Proceeded with successful TEE to NSR.  However, later in 10/23 he was back in AF and has been in AF since that time.   Patient returns for followup of CHF.  At last appointment, he was volume overloaded and I increased his torsemide.  Weight is down 2 lbs.  He feels better overall but still has significant exertional dyspnea.  He has to stop to rest 1 time walking from his apartment to his car, at prior appointment he had to stop 4 times. He had to stop once walking into the office.  No chest pain, no lightheadedness.  He remains in atrial fibrillation but does not feel palpitations.     ECG (personally reviewed): atrial fibrillation rate 87, LBBB-like IVCD 148 msec  REDS clip 45%  Labs (1/19): K 4.9, creatinine 1.19 Labs (2/19): K 4.2, creatinine 0.89 Labs (3/19): LDL 86, HDL 45, TGs 245 Labs (5/19): LDL 110, HDL 39 Labs (6/19): K 4.1, creatinine 1.07 Labs (11/19): LDL 14, HDL 56 Labs (6/20): K 4.1, creatinine 1.0 Labs (7/20): K 4.3, creatinine 1.07 Labs (9/21): LDL  129, hgb 14.8, K 4.1, creatinine 1.02 Labs (12/21): LDL 20, HDL 46, K 4.4, creatinine 0.98 Labs (1/22): LDL 45, HDL 49, K 4.3, creatinine 1.1, BNP 184 Labs (3/22): K 4.4, creatinine 1.10, LDL 15, HDL 49 Labs (6/22): K 4.3, creatinine 1.11, LDL 121 Labs (7/22): K 4.3, creatinine 1.23, hgb 14.5 Labs (11/22): K 4.5, creatinine 1.14 Labs (2/23): K 4.3, creatinine 1.16 Labs (7/23): K 4.4, creatinine 1.24 Labs (9/23): K 4.1, creatinine 1.19 Labs (10/23): K 4.2, creatinine 1.31 Labs (1/24): K 4.8, SCr 1.13, Hgb A1c 7.7 Labs (4/24): K 5, creatinine 1.56 => 1.33, BNP 726  PMH: 1. PAD: Peripheral arterial dopplers in 2012 showed > 75% bilateral SFA stenosis. Peripheral arterial dopplers in 10/14 showed > 50% focal bilateral SFA stenoses.  2. AAA: [place] 4/15 with 4.4 cm AAA but concern for penetrating ulcers. CTA abdomen showed 4.4 cm AAA with penetrating ulcers and possible pseudoaneurysms. Now s/p endovascular repair of AAA in 5/15 with no complications.  3. Carotid stenosis: Carotid dopplers (4/15) with 60-79% bilateral ICA stenosis. Carotid dopplers (11/15) with 60-79% bilateral ICA stenosis.  Carotid dopplers 6/16 with 60-79% bilateral ICA stenosis. - Carotid dopplers (1/18): 60-79% RICA stenosis.  - Carotid dopplers (1/19): 80-99% RICA stenosis => Right CEA in 2/19.  - Carotid dopplers (11/19): 40-59% bilateral stenosis.  - Carotid dopplers (1/21): 1-39% BICA stenosis.  4. CAD: CABG  1989 with LIMA-LAD, SVG-D, seq SVG-ramus and OM, sequential SVG-PDA/PLV. SVG-PDA/PLV found to be occluded on cath prior to AVR, so patient had SVG-PDA with AVR in 2007. LHC (7/15) with LIMA-LAD patent with 40-50% stenosis in LAD after touchdown, sequential SVG-ramus/OM/diagonal with only the ramus branch still intact (known from prior cath), SVG-PDA from original surgery TO, SVG-PDA from redo surgery with long 50-60% mid-graft stenosis.  No target for intervention.  - LHC (9/23): known occlusion of native coronaries and  SVG-RCA from original CABG, new occlusion of SVG-PDA from CABG#2, long 99% stenosis in the SVG-ramus (was sequential SVG-ramus/OM/D but OM and D branches noted to be lost on prior cath), ramus is relatively small vessel, patent LIMA-LAD with collaterals to RCA territory. Case reviewed with Dr. Lynnette Caffey, probably not much benefit to intervention on SVG-ramus. Small territory covered and complex disease in SVG. Medical management advised. 5. Severe aortic stenosis: Bioprosthetic AVR in 2007.  6. Atrial fibrillation: Paroxysmal. Initially noted after cardiac surgery, then again after AAA repair.  DCCV to NSR 06/06/14.  DCCV to NSR again 08/21/14.  - DCCV to NSR in 1/19.  - DCCV to NSR in 6/20.  - Now on Tikosyn.  - DCCV to NSR 12/21.  - DCCV to NSR 2/22. - DCCV to NSR 3/22. - DCCV to NSR 4/23. - DCCV to NSR 9/23. - DCCV to NSR 10/23 7. Type II diabetes  8. Gout  9. GERD  10. Hyperlipidemia  11. HTN 12. GERD  13. Chronic systolic CHF: Echo (6/15) with EF worsened to 25-30% with grade II diastolic dysfunction, stable bioprosthetic aortic valve, mildly dilated RV with normal systolic function.  Echo (1/16) with EF 55-60%, basal inferior hypokinesis, mild LVH, bioprosthetic aortic valve with mean gradient 13 mmHg, mild MR, severe LAE.  - Echo (1/19): EF 45-50%, mild MR, normal bioprosthetic aortic valve.  - TEE (6/20): EF 50% with mild diffuse hypokinesis, mildly decreased RV systolic function with mild RV enlargement, bioprosthetic aortic valve looked ok, mild MR. - Echo (1/22):  EF 45-50%, diffuse hypokinesis, mildly decreased RV systolic function, moderate biatrial enlargement, bioprosthetic valve with mean gradient 8 mmHg.   - TEE (2/22): EF 45-50%, RV normal, moderate biatrial enlargement, bioprosthetic AoV with mean gradient 6 mmHg, mild MR.  - TEE (4/23): EF 50-55%, mildly decreased RV function, normal bioprosthetic aortic valve, mild MR and mild mitral stenosis (mean gradient 3 mmHg) - TEE  (9/23): EF 45%, RV normal, no LAA thrombus, no PFO/ASD, mild to moderate MR w/ moderate MAC, AoV without stenosis/regurgitation - TEE (10/23): EF 45%, RV mildly decreased, no LAA thrombus, no PFO/ASD, moderate MR, AoV without stenosis/regurgitation, mean gradient 6 mmHg. 14. BPH  15. Nonsmall cell lung cancer: Suspected by imaging, treated in 2023 with radiation.  16. Fall with rhabdomyolysis (10/22)   SH: Widower, 2 children, raised his 3 grand-daughters, lives in [place], [place].  Occasional ETOH, no smoking.   FH: Father died from ruptured AAA.   ROS: All systems reviewed and negative except as per HPI.   Current Outpatient Medications  Medication Sig Dispense Refill   atorvastatin (LIPITOR) 20 MG tablet Take 1 tablet (20 mg total) by mouth daily. Alternating with 30mg  30 tablet 5   Coenzyme Q10 (CO Q10) 200 MG CAPS Take 200 mg by mouth daily.     dorzolamide-timolol (COSOPT) 22.3-6.8 MG/ML ophthalmic solution Place 1 drop into both eyes every evening.     ezetimibe (ZETIA) 10 MG tablet Take 1 tablet (10 mg total) by mouth at bedtime.  90 tablet 3   latanoprost (XALATAN) 0.005 % ophthalmic solution Place 1 drop into both eyes at bedtime.     METAMUCIL FIBER PO Take 1 Dose by mouth 2 (two) times a week.     metoprolol succinate (TOPROL-XL) 50 MG 24 hr tablet Take 1 tablet (50 mg total) by mouth in the morning and at bedtime. Take with or immediately following a meal. 60 tablet 3   Misc Natural Products (GLUCOSAMINE CHONDROITIN TRIPLE) TABS Take 1 tablet by mouth daily.     potassium chloride SA (KLOR-CON M) 20 MEQ tablet Take 1 tablet (20 mEq total) by mouth 2 (two) times daily. 90 tablet 2   ramipril (ALTACE) 2.5 MG capsule Take 1 capsule (2.5 mg total) by mouth daily. 30 capsule 3   REPATHA SURECLICK 140 MG/ML SOAJ INJECT 1 PEN INTO SKIN EVERY 14 DAYS 2 mL 11   spironolactone (ALDACTONE) 25 MG tablet Take 1 tablet (25 mg total) by mouth daily. 30 tablet 5   tamsulosin (FLOMAX) 0.4  MG CAPS capsule TAKE ONE CAPSULE BY MOUTH TWICE DAILY 180 capsule 3   warfarin (COUMADIN) 3 MG tablet Tomorrow (9/12) take 2 tablets (6mg ) Starting 9/13 take 3 tablets (9mg ) daily or as directed by Coumadin Clinic (Patient taking differently: Take 9 mg by mouth daily. Patient takes 6 mg on Sunday.) 90 tablet 2   torsemide (DEMADEX) 20 MG tablet Take 3 tablets (60 mg total) by mouth every morning AND 2 tablets (40 mg total) every evening. 90 tablet 5   No current facility-administered medications for this encounter.   BP (!) 90/50   Pulse 76   Wt 82.6 kg (182 lb)   SpO2 94%   BMI 26.11 kg/m    Wt Readings from Last 3 Encounters:  04/23/23 82.6 kg (182 lb)  04/13/23 83.5 kg (184 lb 2 oz)  04/08/23 84 kg (185 lb 3.2 oz)   Physical Exam: General: NAD Neck: JVP 8-9 cm with HJR, no thyromegaly or thyroid nodule.  Lungs: Clear to auscultation bilaterally with normal respiratory effort. CV: Nondisplaced PMI.  Heart irregular S1/S2, no S3/S4, no murmur.  1+ edema to knees bilaterally.  No carotid bruit.  Normal pedal pulses.  Abdomen: Soft, nontender, no hepatosplenomegaly, no distention.  Skin: Intact without lesions or rashes.  Neurologic: Alert and oriented x 3.  Psych: Normal affect. Extremities: No clubbing or cyanosis.  HEENT: Normal.   Assessment/Plan:  1.  Chronic HF with mid-range EF: Echo 09/23 with stable EF 45-50%, LV global hypokinesis, RV systolic function normal. Filling pressures normal on RHC (9/23). TEE in 10/23 showed EF 45%, diffuse HK, RV mildly decreased, moderate MR, bioprosthetic AoV stable with mean gradient 6 mmHg, no LAA. He has been persistently in atrial fibrillation since the end of October.  CHF has been worsened by AF.  He is still volume overloaded today by exam and REDS clip, but feels better than at last appointment.  NYHA class III.  - Continue ramipril 2.5 daily.  - Continue Toprol XL 50 mg bid.  - Increase torsemide to 60 qam/40 qpm.  Add KCl 20 daily.  BMET/BNP today, BMET in 10 days.   - Continue spironolactone 25 mg daily. - Off Jardiance with recent E coli UTI.  - Repeat echo has been scheduled.  - He will wear graded compression stockings.  2.  CAD: s/p CABG and Redo CABG. LHC 06/2014 showed significant coronary disease w/ no good interventional targets (LIMA-LAD patent with 40-50% stenosis in LAD after  touchdown, sequential SVG-ramus/OM/diagonal with only the ramus branch still intact (known from prior cath), SVG-PDA from original surgery TO, SVG-PDA from redo surgery with long 50-60% mid-graft stenosis). NSTEMI 9/23, repeat LHC showed occlusion of the 2nd SVG-PDA and 99% stenosis in the SVG-ramus.  Probably not much benefit to intervention on SVG-ramus.  Small territory covered and complex disease in the SVG.  Plan medical management. No further chest pain. - Now off ASA with warfarin use. - Continue atorvastatin 20 mg QOD + Zetia.  - Continue Repatha.  3. Atrial fibrillation: Difficult to control, failed multiple cardioversions.  Failed amiodarone due to CNS side effects, feels strongly against trying again. Offered ablation years ago by EP but he declined. Had been primarily in NSR on Tikosyn but now failing Tikosyn.  In setting of NSTEMI, he developed recurrent AF. S/p TEE/DCCV 9/23 and 10/23 with successful conversion to NSR initially but then back to AF. He saw Dr. Elberta Fortis who did not think he was a candidate for AF ablation at this point.  Tikosyn was stopped due to futility.  He is in atrial fibrillation today but rate is better controlled.  I do suspect that atrial fibrillation has worsened CHF.  - Continue warfarin.  - Continue Toprol XL 50 mg bid for better rate control.  4. NSCLC: Completed SBRT.  Followed by radiation oncology. Followup CT in 8/23 reviewed with pulmonary, no acute changes 5. Bioprosthetic AVR: Stable on recent echo and TEE. 6. Type 2DM:  - off SLGT2i given UTIs.  - managed by PCP 7. PAD:  AAA s/p EVR, LE PAD +  Carotid Artery Disease s/p Rt CEA. No current claudication symptoms.  - Followed by Dr. Myra Gianotti. - Continue lipid lowering agents per above.   Followup with APP in 2 wks   [name]  04/24/2023

## 2023-04-26 NOTE — Telephone Encounter (Signed)
Attempted to contact patient per request, LVM to call back.  Left call back number.

## 2023-04-26 NOTE — Progress Notes (Unsigned)
  Electrophysiology Office Note:   Date:  04/27/2023  ID:  Nathaniel Craft., DOB 1932-04-07, MRN [id number]  Primary Cardiologist: None Electrophysiologist: Will Jorja Loa, MD   History of Present Illness:   Nathaniel Mial. is a 89 y.o. male with h/o CAD s/p CABG and redo CABG, AAA s/p endovascular repair 2015, PAD, carotid stenosis, AVR, and longstanding persistent AF seen today for acute visit due to on-going issues with CHF.    Pt last seen by Dr. Elberta Fortis 09/2022. Was noted to be back in AF despite several cardioversion. Was felt to have failed tikosyn and not a candidate for ablation. Decision to pursue rate control was made.    Seen in HF clinic 4/25 , 4/30, and 5/10 with acute on chronic CHF. Adjustments to diuretics can helped only modestly, and concern was raised that his AF was contributing. Though rates have been controlled.   Patient reports feeling actually much better today. States he has more energy and less fatigue than he has in weeks. Son who is present denied SOB walking from the car into clinic and room. He wants to know how active he can be. The highest HR he has recorded in the past several months is 112 bpm, immediately after walking into the house from being outside. He denies chest pain or syncope. Edema has improved.   Review of systems complete and found to be negative unless listed in HPI.   Studies Reviewed:    EKG is ordered today. Personal review shows AF at 87 bpm     Physical Exam:   VS:  BP 102/62   Pulse 87   Ht 5\' 10"  (1.778 m)   Wt 176 lb 12.8 oz (80.2 kg)   SpO2 98%   BMI 25.37 kg/m    Wt Readings from Last 3 Encounters:  04/27/23 176 lb 12.8 oz (80.2 kg)  04/23/23 182 lb (82.6 kg)  04/13/23 184 lb 2 oz (83.5 kg)     GEN: Elderly. Well nourished, well developed in no acute distress NECK: No JVD; No carotid bruits CARDIAC: Irregularly irregular rate and rhythm, no murmurs, rubs, gallops RESPIRATORY:  Diminished R basilar sounds, otherwise  clear to auscultation without rales, wheezing or rhonchi  ABDOMEN: Soft, non-tender, non-distended EXTREMITIES:  1+ BLE edema; No deformity   ASSESSMENT AND PLAN:    Longstanding persistent atrial fibrillation Tikosyn stopped and decided on rate control Oct/Nov 2024. Failed tikosyn Failed amiodarone due to CNS off-target effects Has had worsening HF symptoms since February, though rates have been controlled. He feels well today in AF.   Chronic systolic CHF Volume status improved per pt and son Feeling much better on increased torsemide QRS is 148 qith rS in V1 and lead 1. It is not clear that LBBB/CRT would help with his symptoms, and if the concern is loss of A-V synchrony has worsened his CHF, AV nodal ablation is unlikely to help this.  If there are concerns about possible tachycardia, could have him wear a monitor to quantify but with recent office visits HRs have been in the 80s.  Has repeat Echo on Thursday and labs later this week.  CAD s/p CABG and repeat CABG Denies chest pain   Follow up with Dr. Elberta Fortis in 6 months. Sooner with issues.   Signed, Nathaniel Freer, PA-C

## 2023-04-26 NOTE — Telephone Encounter (Signed)
LVM for patient and also for granddaughter to return call to office

## 2023-04-27 ENCOUNTER — Ambulatory Visit: Payer: Medicare Other | Attending: Student | Admitting: Student

## 2023-04-27 ENCOUNTER — Telehealth: Payer: Self-pay | Admitting: *Deleted

## 2023-04-27 ENCOUNTER — Encounter: Payer: Self-pay | Admitting: Student

## 2023-04-27 VITALS — BP 102/62 | HR 87 | Ht 70.0 in | Wt 176.8 lb

## 2023-04-27 DIAGNOSIS — I4891 Unspecified atrial fibrillation: Secondary | ICD-10-CM

## 2023-04-27 DIAGNOSIS — I25810 Atherosclerosis of coronary artery bypass graft(s) without angina pectoris: Secondary | ICD-10-CM | POA: Diagnosis not present

## 2023-04-27 DIAGNOSIS — I5042 Chronic combined systolic (congestive) and diastolic (congestive) heart failure: Secondary | ICD-10-CM | POA: Insufficient documentation

## 2023-04-27 DIAGNOSIS — I4821 Permanent atrial fibrillation: Secondary | ICD-10-CM

## 2023-04-27 NOTE — Telephone Encounter (Signed)
Pt overdue for INR check. Called pt and LMOM to call office back to make appt.

## 2023-04-27 NOTE — Patient Instructions (Signed)
Medication Instructions:  Your physician recommends that you continue on your current medications as directed. Please refer to the Current Medication list given to you today.  *If you need a refill on your cardiac medications before your next appointment, please call your pharmacy*  Lab Work: None ordered If you have labs (blood work) drawn today and your tests are completely normal, you will receive your results only by: MyChart Message (if you have MyChart) OR A paper copy in the mail If you have any lab test that is abnormal or we need to change your treatment, we will call you to review the results.  Follow-Up: At [facility], you and your health needs are our priority.  As part of our continuing mission to provide you with exceptional heart care, we have created designated Provider Care Teams.  These Care Teams include your primary Cardiologist (physician) and Advanced Practice Providers (APPs -  Physician Assistants and Nurse Practitioners) who all work together to provide you with the care you need, when you need it.  We recommend signing up for the patient portal called "MyChart".  Sign up information is provided on this After Visit Summary.  MyChart is used to connect with patients for Virtual Visits (Telemedicine).  Patients are able to view lab/test results, encounter notes, upcoming appointments, etc.  Non-urgent messages can be sent to your provider as well.   To learn more about what you can do with MyChart, go to [email or web address].    Your next appointment:   6 month(s)  Provider:   Loman Brooklyn, MD

## 2023-04-28 ENCOUNTER — Other Ambulatory Visit (HOSPITAL_COMMUNITY): Payer: Medicare Other

## 2023-04-29 ENCOUNTER — Ambulatory Visit (HOSPITAL_COMMUNITY)
Admission: RE | Admit: 2023-04-29 | Discharge: 2023-04-29 | Disposition: A | Discharge status: Home / Self Care | Payer: Medicare Other | Source: Ambulatory Visit | Attending: Family Medicine | Admitting: Family Medicine

## 2023-04-29 ENCOUNTER — Ambulatory Visit (HOSPITAL_COMMUNITY)
Admission: RE | Admit: 2023-04-29 | Discharge: 2023-04-29 | Disposition: A | Discharge status: Home / Self Care | Payer: Medicare Other | Source: Ambulatory Visit

## 2023-04-29 DIAGNOSIS — E119 Type 2 diabetes mellitus without complications: Secondary | ICD-10-CM | POA: Insufficient documentation

## 2023-04-29 DIAGNOSIS — I3481 Nonrheumatic mitral (valve) annulus calcification: Secondary | ICD-10-CM | POA: Diagnosis not present

## 2023-04-29 DIAGNOSIS — I509 Heart failure, unspecified: Secondary | ICD-10-CM | POA: Insufficient documentation

## 2023-04-29 DIAGNOSIS — K219 Gastro-esophageal reflux disease without esophagitis: Secondary | ICD-10-CM | POA: Diagnosis not present

## 2023-04-29 DIAGNOSIS — I2510 Atherosclerotic heart disease of native coronary artery without angina pectoris: Secondary | ICD-10-CM | POA: Insufficient documentation

## 2023-04-29 DIAGNOSIS — Z953 Presence of xenogenic heart valve: Secondary | ICD-10-CM | POA: Diagnosis not present

## 2023-04-29 DIAGNOSIS — I5032 Chronic diastolic (congestive) heart failure: Secondary | ICD-10-CM

## 2023-04-29 DIAGNOSIS — I110 Hypertensive heart disease with heart failure: Secondary | ICD-10-CM | POA: Insufficient documentation

## 2023-04-29 DIAGNOSIS — I340 Nonrheumatic mitral (valve) insufficiency: Secondary | ICD-10-CM | POA: Diagnosis not present

## 2023-04-29 DIAGNOSIS — R0602 Shortness of breath: Secondary | ICD-10-CM | POA: Diagnosis not present

## 2023-04-29 DIAGNOSIS — I5042 Chronic combined systolic (congestive) and diastolic (congestive) heart failure: Secondary | ICD-10-CM

## 2023-04-29 DIAGNOSIS — E785 Hyperlipidemia, unspecified: Secondary | ICD-10-CM | POA: Insufficient documentation

## 2023-04-29 DIAGNOSIS — I4891 Unspecified atrial fibrillation: Secondary | ICD-10-CM | POA: Insufficient documentation

## 2023-04-29 LAB — ECHOCARDIOGRAM COMPLETE
AR max vel: 3.84 cm2
AV Area VTI: 3.5 cm2
AV Area mean vel: 3.49 cm2
AV Mean grad: 4.8 mmHg
AV Peak grad: 7.6 mmHg
Ao pk vel: 1.37 m/s
Calc EF: 36.4 %
MV M vel: 4.51 m/s
MV Peak grad: 81.4 mmHg
Radius: 0.35 cm
S' Lateral: 5 cm
Single Plane A2C EF: 41.7 %
Single Plane A4C EF: 30.6 %

## 2023-04-29 LAB — BASIC METABOLIC PANEL
Anion gap: 11 (ref 5–15)
BUN: 28 mg/dL — ABNORMAL HIGH (ref 8–23)
CO2: 26 mmol/L (ref 22–32)
Calcium: 9.1 mg/dL (ref 8.9–10.3)
Chloride: 97 mmol/L — ABNORMAL LOW (ref 98–111)
Creatinine, Ser: 1.29 mg/dL — ABNORMAL HIGH (ref 0.61–1.24)
GFR, Estimated: 53 mL/min — ABNORMAL LOW (ref >60)
Glucose, Bld: 142 mg/dL — ABNORMAL HIGH (ref 70–99)
Potassium: 4.7 mmol/L (ref 3.5–5.1)
Sodium: 134 mmol/L — ABNORMAL LOW (ref 135–145)

## 2023-04-29 NOTE — Progress Notes (Signed)
  Echocardiogram 2D Echocardiogram has been performed.  Nathaniel [name] 04/29/2023, 1:54 PM

## 2023-04-30 ENCOUNTER — Other Ambulatory Visit (HOSPITAL_COMMUNITY): Payer: Medicare Other

## 2023-04-30 NOTE — Telephone Encounter (Signed)
LVM to granddaughter that lab was resulted. No changes continue current plan

## 2023-05-04 ENCOUNTER — Telehealth: Payer: Self-pay

## 2023-05-04 NOTE — Telephone Encounter (Signed)
Lpmtcb and schedule INR check in order to refill Warfarin prescription.

## 2023-05-05 ENCOUNTER — Other Ambulatory Visit (HOSPITAL_COMMUNITY): Payer: Self-pay | Admitting: Cardiology

## 2023-05-05 DIAGNOSIS — I4891 Unspecified atrial fibrillation: Secondary | ICD-10-CM

## 2023-05-05 NOTE — Telephone Encounter (Signed)
Per DPR left detailed message on home answering machine, stating pt was overdue for Coumadin check and we were not able to refill Warfarin until his INR is chacked.  Requested call back to schedule f/u appt.

## 2023-05-06 ENCOUNTER — Other Ambulatory Visit (HOSPITAL_COMMUNITY): Payer: Self-pay | Admitting: Cardiology

## 2023-05-06 NOTE — Telephone Encounter (Signed)
Called pt since he is overdue for his Anticoagulation Appt. He was last seen on 02/22/23 and was due on 03/25/23 and did not show up for this appt. Left a message for him to call back. Will await pt to call back.

## 2023-05-07 NOTE — Telephone Encounter (Signed)
Called pt since overdue for INR monitoring and requesting a refill; left pt a message to call back regarding this.

## 2023-05-11 NOTE — Progress Notes (Signed)
ADVANCED HF CLINIC NOTE                                                                                                                                                                                                                                                                                                                                                                                                                                                                                                   Patient ID: Nathaniel [name]., male   DOB: Sep 28, 1932, 89 y.o.   MRN: [id number] PCP: Dr. Leslie Dales Cardiology: Dr. Shirlee Latch  89 y.o. with history of extensive vascular disease including CAD s/p CABG and redo CABG, AAA s/p endovascular repair in 2015, PAD, and carotid stenosis as well as prior AVR.                             After his AAA repair, he was noted to be in atrial fibrillation.  He had had a prior episode of atrial fibrillation after his cardiac surgery in 2007.  Atrial fibrillation  was rate-controlled.  He was started on warfarin and Toprol XL.  He developed exertional dyspnea and fatigue and was cardioverted back to SR 06/06/14.  Echo 06/15 EF worsened to 25-30% with stable bioprosthetic aortic valve, mildly dilated RV with normal systolic function.  LHC 07/15 showed stable anatomy.  The LIMA-LAD was patent, the sequential SVG-OM/ramus/D was patent only to ramus but this is the same as the prior cath, and the SVG-PDA was patent with a long 50-60% mid-graft stenosis.  No intervention.  Of note, at cath he was noted to be back in atrial fibrillation.  He was started on amiodarone and had DCCV again on 08/21/14.  This was successful.  He has been off amiodarone due to side effects (hallucinations).  Echo in 1/16 showed recovery of EF to 55-60%.    He was admitted in 1/19 with bronchitis/wheezing/dyspnea and was found to be in atrial fibrillation with RVR.  He tested positive for metapneumovirus.  Echo  showed EF 45-50% with normal bioprosthetic aortic valve. He was diuresed for volume overload while in the hospital. He had DCCV to NSR.    Carotid dopplers in 1/19 showed 80-99% RICA stenosis, he had right CEA in 2/19 with no complications.   At appointment in early 6/20, Nathaniel [name] was noted to be back in atrial fibrillation with RVR and mild CHF.  He was started on Toprol XL and Lasix was increased.  He had TEE-guided DCCV to NSR in 6/20.  TEE showed EF 50%, mild diffuse hypokinesis, mildly decreased RV systolic function, normally functioning bioprosthetic aortic valve.   In 2021, he was admitted for Tikosyn initiation.  In 12/21, he went into atrial fibrillation again and was cardioverted back to NSR.   Echo in 1/22 showed EF 45-50%, diffuse hypokinesis, mildly decreased RV systolic function, moderate biatrial enlargement, bioprosthetic valve with mean gradient 8 mmHg.    In 2/22, he went into atrial fibrillation and had TEE-guided DCCV back to NSR.  TEE showed EF 45-50%.    In 3/22, he was back in the ER with atrial fibrillation triggered by [name] most likely.  He had a cardioversion back to NSR.   In 10/22, patient developed urinary retention and an E coli UTI.  In the setting of this, he had a fall at home and was unable to get up due to weakness for hours.  He developed rhabdomyolysis.  This resolved with minimal renal damage with IV fluid hydration and he was discharged to rehab.   Follow up 2/23 he was in atrial fibrillation, TEE/DCCV arranged however INR subtherapeutic. He cancelled rescheduled procedure due to a death in the family.  Follow up 3/23. Remained in AF. Volume overloaded. Lasix increased for 3 days. Underwent TEE guided DCCV on 03/19/22.  TEE showed EF 50-55%, mildly decreased RV function, normal bioprosthetic aortic valve, mild Nathaniel and mild mitral stenosis (mean gradient 3 mmHg).    Recently diagnosed with stage IA2 NSCLC RUL. Saw Radiation Oncology and Pulmonary and  decided on empiric treatment with SBRT.  He has completed radiation.   Admitted 9/23 with NSTEMI and AF with RVR. Underwent R/LHC showing normal filling pressures, known occlusion of native coronaries and SVG-RCA from original CABG. New occlusion of SVG-PDA from CABG#2. Long 99% stenosis in the SVG-ramus. Ramus is relatively small vessel. Patent LIMA-LAD with collaterals to RCA territory. Case was discussed with Dr. Lynnette Caffey, wouldn't benefit from intervention, medically managed. Patient declined re-challenge of amiodarone and was able to have successful TEE/DCCV to NSR. TEE showed  EF 45% with normal RV, mild-moderate Nathaniel, normal bioprosthetic aortic valve, no LA appendage thrombus. Discharged home, weight 184 lbs.  Follow up 9/23, back in atrial fibrillation with HR 116. Toprol increased to 25 bid and arranged for TEE/DCCV.  TEE/DCCV (10/23) showed EF 45%, diffuse HK, RV mildly decreased, moderate Nathaniel, Bioprosthetic AoV stable with mean gradient 6 mmHg, no LAA. Proceeded with successful TEE to NSR.  However, later in 10/23 he was back in AF and has been in AF since that time.   F/u 5/24 in AHF clinic volume overloaded and worsening SOB. Torsemide increased and referred back to EP for a fib f/u.   F/u in a fib clinic 5/24, rate controlled. Failed tikosyn and amio in the past.   Today he returns for AHF follow up. Overall feeling stronger and better. Denies palpitations, CP, dizziness, or PND/Orthopnea. Still gets SOB with ambulation but is taking less breaks between walks. Appetite good. No fever or chills. Mild edema in his ankles but he feels its getting better. Weight at home 178 pounds. Was unable to take torsemide regimen for 5 days when prescribed d/t pharmacy issues, now on regimen for about 9 days. Has been making lots of urine. Taking all medications. Has not been wearing compression socks. Feels like he has more energy now and wants to go play golf.   ECG (personally reviewed): a fib with PVCs  86 bpm  REDS 38%  Labs (1/19): K 4.9, creatinine 1.19 Labs (2/19): K 4.2, creatinine 0.89 Labs (3/19): LDL 86, HDL 45, TGs 245 Labs (5/19): LDL 110, HDL 39 Labs (6/19): K 4.1, creatinine 1.07 Labs (11/19): LDL 14, HDL 56 Labs (6/20): K 4.1, creatinine 1.0 Labs (7/20): K 4.3, creatinine 1.07 Labs (9/21): LDL 129, hgb 14.8, K 4.1, creatinine 1.02 Labs (12/21): LDL 20, HDL 46, K 4.4, creatinine 8.11 Labs (1/22): LDL 45, HDL 49, K 4.3, creatinine 1.1, BNP 184 Labs (3/22): K 4.4, creatinine 1.10, LDL 15, HDL 49 Labs (6/22): K 4.3, creatinine 1.11, LDL 121 Labs (7/22): K 4.3, creatinine 1.23, hgb 14.5 Labs (11/22): K 4.5, creatinine 1.14 Labs (2/23): K 4.3, creatinine 1.16 Labs (7/23): K 4.4, creatinine 1.24 Labs (9/23): K 4.1, creatinine 1.19 Labs (10/23): K 4.2, creatinine 1.31 Labs (1/24): K 4.8, SCr 1.13, Hgb A1c 7.7 Labs (4/24): K 5, creatinine 1.56 => 1.33, BNP 726 Labs (5/24): K 4.7, SCr 1.29 BNP 895  PMH: 1. PAD: Peripheral arterial dopplers in 2012 showed > 75% bilateral SFA stenosis. Peripheral arterial dopplers in 10/14 showed > 50% focal bilateral SFA stenoses.  2. AAA: [place] 4/15 with 4.4 cm AAA but concern for penetrating ulcers. CTA abdomen showed 4.4 cm AAA with penetrating ulcers and possible pseudoaneurysms. Now s/p endovascular repair of AAA in 5/15 with no complications.  3. Carotid stenosis: Carotid dopplers (4/15) with 60-79% bilateral ICA stenosis. Carotid dopplers (11/15) with 60-79% bilateral ICA stenosis.  Carotid dopplers 6/16 with 60-79% bilateral ICA stenosis. - Carotid dopplers (1/18): 60-79% RICA stenosis.  - Carotid dopplers (1/19): 80-99% RICA stenosis => Right CEA in 2/19.  - Carotid dopplers (11/19): 40-59% bilateral stenosis.  - Carotid dopplers (1/21): 1-39% BICA stenosis.  4. CAD: CABG 1989 with LIMA-LAD, SVG-D, seq SVG-ramus and OM, sequential SVG-PDA/PLV. SVG-PDA/PLV found to be occluded on cath prior to AVR, so patient had SVG-PDA with AVR in 2007.  LHC (7/15) with LIMA-LAD patent with 40-50% stenosis in LAD after touchdown, sequential SVG-ramus/OM/diagonal with only the ramus branch still intact (known from prior cath), SVG-PDA from  original surgery TO, SVG-PDA from redo surgery with long 50-60% mid-graft stenosis.  No target for intervention.  - LHC (9/23): known occlusion of native coronaries and SVG-RCA from original CABG, new occlusion of SVG-PDA from CABG#2, long 99% stenosis in the SVG-ramus (was sequential SVG-ramus/OM/D but OM and D branches noted to be lost on prior cath), ramus is relatively small vessel, patent LIMA-LAD with collaterals to RCA territory. Case reviewed with Dr. Lynnette Caffey, probably not much benefit to intervention on SVG-ramus. Small territory covered and complex disease in SVG. Medical management advised. 5. Severe aortic stenosis: Bioprosthetic AVR in 2007.  6. Atrial fibrillation: Paroxysmal. Initially noted after cardiac surgery, then again after AAA repair.  DCCV to NSR 06/06/14.  DCCV to NSR again 08/21/14.  - DCCV to NSR in 1/19.  - DCCV to NSR in 6/20.  - Now on Tikosyn.  - DCCV to NSR 12/21, 2/22, 3/22, 4/23, 9/23, 10/23 7. Type II diabetes  8. Gout  9. GERD  10. Hyperlipidemia  11. HTN 12. GERD  13. Chronic systolic CHF: Echo (6/15) with EF worsened to 25-30% with grade II diastolic dysfunction, stable bioprosthetic aortic valve, mildly dilated RV with normal systolic function.  Echo (1/16) with EF 55-60%, basal inferior hypokinesis, mild LVH, bioprosthetic aortic valve with mean gradient 13 mmHg, mild Nathaniel, severe LAE.  - Echo (1/19): EF 45-50%, mild Nathaniel, normal bioprosthetic aortic valve.  - TEE (6/20): EF 50% with mild diffuse hypokinesis, mildly decreased RV systolic function with mild RV enlargement, bioprosthetic aortic valve looked ok, mild Nathaniel. - Echo (1/22):  EF 45-50%, diffuse hypokinesis, mildly decreased RV systolic function, moderate biatrial enlargement, bioprosthetic valve with mean gradient 8 mmHg.    - TEE (2/22): EF 45-50%, RV normal, moderate biatrial enlargement, bioprosthetic AoV with mean gradient 6 mmHg, mild Nathaniel.  - TEE (4/23): EF 50-55%, mildly decreased RV function, normal bioprosthetic aortic valve, mild Nathaniel and mild mitral stenosis (mean gradient 3 mmHg) - TEE (9/23): EF 45%, RV normal, no LAA thrombus, no PFO/ASD, mild to moderate Nathaniel w/ moderate MAC, AoV without stenosis/regurgitation - TEE (10/23): EF 45%, RV mildly decreased, no LAA thrombus, no PFO/ASD, moderate Nathaniel, AoV without stenosis/regurgitation, mean gradient 6 mmHg. - Echo 5/24 EF 30-35%. May be related to a fib. RV mildly reduced, Nathaniel moderate-severe.  14. BPH  15. Nonsmall cell lung cancer: Suspected by imaging, treated in 2023 with radiation.  16. Fall with rhabdomyolysis (10/22)   SH: Widower, 2 children, raised his 3 grand-daughters, lives in [place], [place].  Occasional ETOH, no smoking.   FH: Father died from ruptured AAA.   ROS: All systems reviewed and negative except as per HPI.   Current Outpatient Medications  Medication Sig Dispense Refill   atorvastatin (LIPITOR) 20 MG tablet Take 1 tablet (20 mg total) by mouth daily. Alternating with 30mg  30 tablet 5   Coenzyme Q10 (CO Q10) 200 MG CAPS Take 200 mg by mouth daily.     dorzolamide-timolol (COSOPT) 22.3-6.8 MG/ML ophthalmic solution Place 1 drop into both eyes every evening.     ezetimibe (ZETIA) 10 MG tablet Take 1 tablet (10 mg total) by mouth at bedtime. 90 tablet 3   latanoprost (XALATAN) 0.005 % ophthalmic solution Place 1 drop into both eyes at bedtime.     METAMUCIL FIBER PO Take 1 Dose by mouth 2 (two) times a week.     metoprolol succinate (TOPROL-XL) 50 MG 24 hr tablet Take 1 tablet (50 mg total) by mouth in the morning and  at bedtime. Take with or immediately following a meal. 60 tablet 3   Misc Natural Products (GLUCOSAMINE CHONDROITIN TRIPLE) TABS Take 1 tablet by mouth daily.     potassium chloride SA (KLOR-CON M) 20 MEQ tablet Take  1 tablet (20 mEq total) by mouth 2 (two) times daily. 90 tablet 2   ramipril (ALTACE) 2.5 MG capsule Take 1 capsule (2.5 mg total) by mouth daily. 30 capsule 3   REPATHA SURECLICK 140 MG/ML SOAJ INJECT 1 PEN INTO SKIN EVERY 14 DAYS 2 mL 11   spironolactone (ALDACTONE) 25 MG tablet Take 1 tablet (25 mg total) by mouth daily. 30 tablet 5   tamsulosin (FLOMAX) 0.4 MG CAPS capsule TAKE ONE CAPSULE BY MOUTH TWICE DAILY 180 capsule 3   torsemide (DEMADEX) 20 MG tablet Take 3 tablets (60 mg total) by mouth every morning AND 2 tablets (40 mg total) every evening. 90 tablet 5   warfarin (COUMADIN) 3 MG tablet Take 1-2 tablets Daily or as prescribed by Coumadin Clinic 90 tablet 0   No current facility-administered medications for this encounter.   BP 96/64   Pulse 98   Wt 79.9 kg (176 lb 3.2 oz)   SpO2 96%   BMI 25.28 kg/m    Wt Readings from Last 3 Encounters:  05/12/23 79.9 kg (176 lb 3.2 oz)  04/27/23 80.2 kg (176 lb 12.8 oz)  04/23/23 82.6 kg (182 lb)   Physical Exam: General:  elderly appearing.  No respiratory difficulty. Walked into clinic HEENT: normal +glasses Neck: supple. JVD  ~8 cm. Carotids 2+ bilat; no bruits. No lymphadenopathy or thyromegaly appreciated. Cor: PMI nondisplaced. Regular rate & irregular rhythm. No rubs, gallops or murmurs. Lungs: clear Abdomen: soft, nontender, nondistended. No hepatosplenomegaly. No bruits or masses. Good bowel sounds. Extremities: no cyanosis, clubbing, rash, +1 BLE edema  Neuro: alert & oriented x 3, cranial nerves grossly intact. moves all 4 extremities w/o difficulty. Affect pleasant.   Assessment/Plan:  1.  Chronic HF with mid-range EF: Echo 09/23 with stable EF 45-50%, LV global hypokinesis, RV systolic function normal. Filling pressures normal on RHC (9/23). TEE in 10/23 showed EF 45%, diffuse HK, RV mildly decreased, moderate Nathaniel, bioprosthetic AoV stable with mean gradient 6 mmHg, no LAA. He has been persistently in atrial fibrillation  since the end of October.  CHF has been worsened by AF.  Echo 5/24 EF 30-35%. May be related to a fib. RV mildly reduced, Nathaniel moderate-severe.  - Still has some fluid on him but it's much improved from previous visit. REDS clip 38%, continues to feel better. NYHA class III.  - Continue ramipril 2.5 daily.  - Continue Toprol XL 50 mg bid.  - Continue torsemide 60 qam/40 qpm and KCl BID. BMET/BNP today.  - Continue spironolactone 25 mg daily. - Off Jardiance with recent E coli UTI.  - Provided list again of where to get his compression socks, lost it last time.  2.  CAD: s/p CABG and Redo CABG. LHC 06/2014 showed significant coronary disease w/ no good interventional targets (LIMA-LAD patent with 40-50% stenosis in LAD after touchdown, sequential SVG-ramus/OM/diagonal with only the ramus branch still intact (known from prior cath), SVG-PDA from original surgery TO, SVG-PDA from redo surgery with long 50-60% mid-graft stenosis). NSTEMI 9/23, repeat LHC showed occlusion of the 2nd SVG-PDA and 99% stenosis in the SVG-ramus.  Probably not much benefit to intervention on SVG-ramus.  Small territory covered and complex disease in the SVG.  Plan medical management. No  further chest pain. - Now off ASA with warfarin use. - Continue atorvastatin 20 mg QOD + Zetia.  - Continue Repatha.  3. Atrial fibrillation: Difficult to control, failed multiple cardioversions.  Failed amiodarone due to CNS side effects, feels strongly against trying again. Offered ablation years ago by EP but he declined. Had been primarily in NSR on Tikosyn ultimately failed Tikosyn, stopped d/t futility.  In setting of NSTEMI, he developed recurrent AF. S/p TEE/DCCV 9/23 and 10/23 with successful conversion to NSR initially but then back to AF. He saw Dr. Elberta Fortis who did not think he was a candidate for AF ablation at this point.  He is in atrial fibrillation today but rate is better controlled.  - Continue warfarin.  - Continue Toprol XL  50 mg bid for better rate control.  4. NSCLC: Completed SBRT.  Followed by radiation oncology. Followup CT in 8/23 reviewed with pulmonary, no acute changes 5. Bioprosthetic AVR: Stable on recent echo and TEE. 6. Type 2DM:  - off SLGT2i given UTIs.  - managed by PCP 7. PAD:  AAA s/p EVR, LE PAD + Carotid Artery Disease s/p Rt CEA. No current claudication symptoms.  - Followed by Dr. Myra Gianotti. - Continue lipid lowering agents per above.   Follow up with APP in 4-6 weeks to reassess fluid status. Encouraged to call if needed to be seen sooner.   Alen Bleacher AGACNP-BC  05/12/2023

## 2023-05-12 ENCOUNTER — Ambulatory Visit (HOSPITAL_COMMUNITY)
Admission: RE | Admit: 2023-05-12 | Discharge: 2023-05-12 | Disposition: A | Discharge status: Home / Self Care | Payer: Medicare Other | Source: Ambulatory Visit | Attending: Family Medicine | Admitting: Family Medicine

## 2023-05-12 ENCOUNTER — Encounter (HOSPITAL_COMMUNITY): Payer: Self-pay

## 2023-05-12 ENCOUNTER — Telehealth (HOSPITAL_COMMUNITY): Payer: Self-pay

## 2023-05-12 VITALS — BP 96/64 | HR 98 | Wt 176.2 lb

## 2023-05-12 DIAGNOSIS — I4819 Other persistent atrial fibrillation: Secondary | ICD-10-CM | POA: Insufficient documentation

## 2023-05-12 DIAGNOSIS — I5042 Chronic combined systolic (congestive) and diastolic (congestive) heart failure: Secondary | ICD-10-CM

## 2023-05-12 DIAGNOSIS — E1151 Type 2 diabetes mellitus with diabetic peripheral angiopathy without gangrene: Secondary | ICD-10-CM | POA: Diagnosis not present

## 2023-05-12 DIAGNOSIS — I714 Abdominal aortic aneurysm, without rupture, unspecified: Secondary | ICD-10-CM | POA: Insufficient documentation

## 2023-05-12 DIAGNOSIS — I5022 Chronic systolic (congestive) heart failure: Secondary | ICD-10-CM | POA: Diagnosis not present

## 2023-05-12 DIAGNOSIS — Z7901 Long term (current) use of anticoagulants: Secondary | ICD-10-CM | POA: Diagnosis not present

## 2023-05-12 DIAGNOSIS — Z79899 Other long term (current) drug therapy: Secondary | ICD-10-CM | POA: Insufficient documentation

## 2023-05-12 DIAGNOSIS — I110 Hypertensive heart disease with heart failure: Secondary | ICD-10-CM | POA: Diagnosis not present

## 2023-05-12 DIAGNOSIS — Z951 Presence of aortocoronary bypass graft: Secondary | ICD-10-CM | POA: Diagnosis not present

## 2023-05-12 DIAGNOSIS — I739 Peripheral vascular disease, unspecified: Secondary | ICD-10-CM | POA: Diagnosis not present

## 2023-05-12 DIAGNOSIS — I252 Old myocardial infarction: Secondary | ICD-10-CM | POA: Insufficient documentation

## 2023-05-12 DIAGNOSIS — C3490 Malignant neoplasm of unspecified part of unspecified bronchus or lung: Secondary | ICD-10-CM

## 2023-05-12 DIAGNOSIS — R0602 Shortness of breath: Secondary | ICD-10-CM | POA: Insufficient documentation

## 2023-05-12 DIAGNOSIS — I4821 Permanent atrial fibrillation: Secondary | ICD-10-CM | POA: Diagnosis not present

## 2023-05-12 DIAGNOSIS — Z953 Presence of xenogenic heart valve: Secondary | ICD-10-CM | POA: Diagnosis not present

## 2023-05-12 DIAGNOSIS — I25810 Atherosclerosis of coronary artery bypass graft(s) without angina pectoris: Secondary | ICD-10-CM | POA: Insufficient documentation

## 2023-05-12 DIAGNOSIS — I2510 Atherosclerotic heart disease of native coronary artery without angina pectoris: Secondary | ICD-10-CM | POA: Insufficient documentation

## 2023-05-12 DIAGNOSIS — E1169 Type 2 diabetes mellitus with other specified complication: Secondary | ICD-10-CM

## 2023-05-12 DIAGNOSIS — Z952 Presence of prosthetic heart valve: Secondary | ICD-10-CM

## 2023-05-12 LAB — BASIC METABOLIC PANEL
Anion gap: 10 (ref 5–15)
BUN: 39 mg/dL — ABNORMAL HIGH (ref 8–23)
CO2: 26 mmol/L (ref 22–32)
Calcium: 9.2 mg/dL (ref 8.9–10.3)
Chloride: 96 mmol/L — ABNORMAL LOW (ref 98–111)
Creatinine, Ser: 2.14 mg/dL — ABNORMAL HIGH (ref 0.61–1.24)
GFR, Estimated: 29 mL/min — ABNORMAL LOW (ref >60)
Glucose, Bld: 143 mg/dL — ABNORMAL HIGH (ref 70–99)
Potassium: 4.9 mmol/L (ref 3.5–5.1)
Sodium: 132 mmol/L — ABNORMAL LOW (ref 135–145)

## 2023-05-12 LAB — BRAIN NATRIURETIC PEPTIDE: B Natriuretic Peptide: 398.9 pg/mL — ABNORMAL HIGH (ref 0.0–100.0)

## 2023-05-12 MED ORDER — TORSEMIDE 20 MG PO TABS: 40.0000 mg | ORAL_TABLET | Freq: Two times a day (BID) | ORAL | 5 refills | Status: DC

## 2023-05-12 MED ORDER — POTASSIUM CHLORIDE CRYS ER 20 MEQ PO TBCR: 20.0000 meq | EXTENDED_RELEASE_TABLET | Freq: Every day | ORAL | 2 refills | Status: DC

## 2023-05-12 NOTE — Patient Instructions (Signed)
It was great to see you today! No medication changes are needed at this time.   Labs today We will only contact you if something comes back abnormal or we need to make some changes. Otherwise no news is good news!  Your physician recommends that you schedule a follow-up appointment in: 4-6 weeks  in the Advanced Practitioners (PA/NP) Clinic    Do the following things EVERYDAY: Weigh yourself in the morning before breakfast. Write it down and keep it in a log. Take your medicines as prescribed Eat low salt foods--Limit salt (sodium) to 2000 mg per day.  Stay as active as you can everyday Limit all fluids for the day to less than 2 liters  At the [facility], you and your health needs are our priority. As part of our continuing mission to provide you with exceptional heart care, we have created designated Provider Care Teams. These Care Teams include your primary Cardiologist (physician) and Advanced Practice Providers (APPs- Physician Assistants and Nurse Practitioners) who all work together to provide you with the care you need, when you need it.   You may see any of the following providers on your designated Care Team at your next follow up: Dr Arvilla Meres Dr [name] Dr. Marcos Eke, NP Robbie Lis, [place] [facility] [place], [place] Brynda Peon, NP Karle Plumber, PharmD   Please be sure to bring in all your medications bottles to every appointment.    Thank you for choosing [facility]

## 2023-05-12 NOTE — Progress Notes (Signed)
ReDS Vest / Clip - 05/12/23 0800       ReDS Vest / Clip   Station Marker C    Ruler Value 28    ReDS Value Range Moderate volume overload    ReDS Actual Value 38

## 2023-05-12 NOTE — Telephone Encounter (Addendum)
Pt aware, agreeable, and verbalized understanding  Labs scheduled and ordered. Med list updated   ----- Message from Alen Bleacher, NP sent at 05/12/2023 10:20 AM EDT ----- Volume improving, renal function elevated. Cut back Torsemide to 40 mg twice a day. Cut back potassium to 20 mEq once a day. BMET/BNP 7-10 days.

## 2023-05-25 ENCOUNTER — Ambulatory Visit (HOSPITAL_COMMUNITY)
Admission: RE | Admit: 2023-05-25 | Discharge: 2023-05-25 | Disposition: A | Discharge status: Home / Self Care | Payer: Medicare Other | Source: Ambulatory Visit | Attending: Cardiology | Admitting: Cardiology

## 2023-05-25 DIAGNOSIS — I5042 Chronic combined systolic (congestive) and diastolic (congestive) heart failure: Secondary | ICD-10-CM | POA: Diagnosis not present

## 2023-05-25 LAB — BASIC METABOLIC PANEL
Anion gap: 12 (ref 5–15)
BUN: 36 mg/dL — ABNORMAL HIGH (ref 8–23)
CO2: 23 mmol/L (ref 22–32)
Calcium: 9 mg/dL (ref 8.9–10.3)
Chloride: 97 mmol/L — ABNORMAL LOW (ref 98–111)
Creatinine, Ser: 1.66 mg/dL — ABNORMAL HIGH (ref 0.61–1.24)
GFR, Estimated: 39 mL/min — ABNORMAL LOW (ref >60)
Glucose, Bld: 125 mg/dL — ABNORMAL HIGH (ref 70–99)
Potassium: 4.6 mmol/L (ref 3.5–5.1)
Sodium: 132 mmol/L — ABNORMAL LOW (ref 135–145)

## 2023-05-26 DIAGNOSIS — D225 Melanocytic nevi of trunk: Secondary | ICD-10-CM | POA: Diagnosis not present

## 2023-05-26 DIAGNOSIS — L821 Other seborrheic keratosis: Secondary | ICD-10-CM | POA: Diagnosis not present

## 2023-05-26 DIAGNOSIS — D485 Neoplasm of uncertain behavior of skin: Secondary | ICD-10-CM | POA: Diagnosis not present

## 2023-05-26 DIAGNOSIS — L570 Actinic keratosis: Secondary | ICD-10-CM | POA: Diagnosis not present

## 2023-05-26 DIAGNOSIS — D1801 Hemangioma of skin and subcutaneous tissue: Secondary | ICD-10-CM | POA: Diagnosis not present

## 2023-05-26 DIAGNOSIS — L812 Freckles: Secondary | ICD-10-CM | POA: Diagnosis not present

## 2023-05-26 DIAGNOSIS — Z85828 Personal history of other malignant neoplasm of skin: Secondary | ICD-10-CM | POA: Diagnosis not present

## 2023-05-26 DIAGNOSIS — D0461 Carcinoma in situ of skin of right upper limb, including shoulder: Secondary | ICD-10-CM | POA: Diagnosis not present

## 2023-06-02 ENCOUNTER — Other Ambulatory Visit (HOSPITAL_COMMUNITY): Payer: Self-pay | Admitting: Cardiology

## 2023-06-02 DIAGNOSIS — I4891 Unspecified atrial fibrillation: Secondary | ICD-10-CM

## 2023-06-02 NOTE — Telephone Encounter (Signed)
Prescription refill request received for warfarin Lov: 05/12/23 (CHF) Next INR check: Overdue Warfarin tablet strength: 3mg   Overdue for Coumadin Clinic appt. Called pt to make him aware, no answer. Left message on voicemail.

## 2023-06-03 NOTE — Telephone Encounter (Signed)
Spoke with pt and advised that he was overdue for his Anticoagulation Appt. He set an appt for next week and is aware I can only send in a week supply of warfarin and he verbalized understanding.   Last INR 02/25/23 was due 03/25/23 Last OV 05/12/23 Afib

## 2023-06-10 ENCOUNTER — Ambulatory Visit: Payer: Medicare Other | Attending: Cardiology

## 2023-06-10 DIAGNOSIS — Z5181 Encounter for therapeutic drug level monitoring: Secondary | ICD-10-CM | POA: Insufficient documentation

## 2023-06-10 LAB — POCT INR: INR: 3.2 — AB (ref 2.0–3.0)

## 2023-06-10 NOTE — Patient Instructions (Signed)
Description   Only take 2 tablets today and then continue taking Warfarin 3 tablets daily except 2 tablets on Sunday.  Recheck INR in 4 weeks.  Stay consistent with greens each week. Coumadin Clinic [phone].

## 2023-06-22 NOTE — Progress Notes (Signed)
ADVANCED HF CLINIC NOTE                                                                                                                                                                                                                                                                                                                                                                                                                                                                                                   Patient ID: Nathaniel [name]., male   DOB: [birth year], 89 y.o.   MRN: [id number] PCP: Dr. Leslie [name] Cardiology: Dr. Shirlee [name]  89 y.o. with history of extensive vascular disease including CAD s/p CABG and redo CABG, AAA s/p endovascular repair in 2015, PAD, and carotid stenosis as well as prior AVR.                             After his AAA repair, he was noted to be in atrial fibrillation.  He had had a prior episode of atrial fibrillation after his cardiac surgery in 2007.  Atrial fibrillation  was rate-controlled.  He was started on warfarin and Toprol XL.  He developed exertional dyspnea and fatigue and was cardioverted back to SR 06/06/14.  Echo 06/15 EF worsened to 25-30% with stable bioprosthetic aortic valve, mildly dilated RV with normal systolic function.  LHC 07/15 showed stable anatomy.  The LIMA-LAD was patent, the sequential SVG-OM/ramus/D was patent only to ramus but this is the same as the prior cath, and the SVG-PDA was patent with a long 50-60% mid-graft stenosis.  No intervention.  Of note, at cath he was noted to be back in atrial fibrillation.  He was started on amiodarone and had DCCV again on 08/21/14.  This was successful.  He has been off amiodarone due to side effects (hallucinations).  Echo in 1/16 showed recovery of EF to 55-60%.    He was admitted in 1/19 with bronchitis/wheezing/dyspnea and was found to be in atrial fibrillation with RVR.  He tested positive for metapneumovirus.  Echo  showed EF 45-50% with normal bioprosthetic aortic valve. He was diuresed for volume overload while in the hospital. He had DCCV to NSR.    Carotid dopplers in 1/19 showed 80-99% RICA stenosis, he had right CEA in 2/19 with no complications.   At appointment in early 6/20, Nathaniel [name] was noted to be back in atrial fibrillation with RVR and mild CHF.  He was started on Toprol XL and Lasix was increased.  He had TEE-guided DCCV to NSR in 6/20.  TEE showed EF 50%, mild diffuse hypokinesis, mildly decreased RV systolic function, normally functioning bioprosthetic aortic valve.   In 2021, he was admitted for Tikosyn initiation.  In 12/21, he went into atrial fibrillation again and was cardioverted back to NSR.   Echo in 1/22 showed EF 45-50%, diffuse hypokinesis, mildly decreased RV systolic function, moderate biatrial enlargement, bioprosthetic valve with mean gradient 8 mmHg.    In 2/22, he went into atrial fibrillation and had TEE-guided DCCV back to NSR.  TEE showed EF 45-50%.    In 3/22, he was back in the ER with atrial fibrillation triggered by [name] most likely.  He had a cardioversion back to NSR.   In 10/22, patient developed urinary retention and an E coli UTI.  In the setting of this, he had a fall at home and was unable to get up due to weakness for hours.  He developed rhabdomyolysis.  This resolved with minimal renal damage with IV fluid hydration and he was discharged to rehab.   Follow up 2/23 he was in atrial fibrillation, TEE/DCCV arranged however INR subtherapeutic. He cancelled rescheduled procedure due to a death in the family.  Follow up 3/23. Remained in AF. Volume overloaded. Lasix increased for 3 days. Underwent TEE guided DCCV on 03/19/22.  TEE showed EF 50-55%, mildly decreased RV function, normal bioprosthetic aortic valve, mild Nathaniel and mild mitral stenosis (mean gradient 3 mmHg).    Recently diagnosed with stage IA2 NSCLC RUL. Saw Radiation Oncology and Pulmonary and  decided on empiric treatment with SBRT.  He has completed radiation.   Admitted 9/23 with NSTEMI and AF with RVR. Underwent R/LHC showing normal filling pressures, known occlusion of native coronaries and SVG-RCA from original CABG. New occlusion of SVG-PDA from CABG#2. Long 99% stenosis in the SVG-ramus. Ramus is relatively small vessel. Patent LIMA-LAD with collaterals to RCA territory. Case was discussed with Dr. Lynnette [name], wouldn't benefit from intervention, medically managed. Patient declined re-challenge of amiodarone and was able to have successful TEE/DCCV to NSR. TEE showed  EF 45% with normal RV, mild-moderate Nathaniel, normal bioprosthetic aortic valve, no LA appendage thrombus. Discharged home, weight 184 lbs.  Follow up 9/23, back in atrial fibrillation with HR 116. Toprol increased to 25 bid and arranged for TEE/DCCV.  TEE/DCCV (10/23) showed EF 45%, diffuse HK, RV mildly decreased, moderate Nathaniel, Bioprosthetic AoV stable with mean gradient 6 mmHg, no LAA. Proceeded with successful TEE to NSR.  However, later in 10/23 he was back in AF and has been in AF since that time.   F/u 5/24 in AHF clinic volume overloaded and worsening SOB. Torsemide increased and referred back to EP for a fib f/u.   F/u in a fib clinic 5/24, rate controlled. Failed tikosyn and amio in the past.   Last OV 5/24, he endorsed stable NYHA Class III symptoms. REDS clip was 38%. Labs showed notable bump in SCr from 1.3>>2.2. He was instructed to reduce torsemide from 60qam/40 qpm to 40 mg bid. He has f/u labs a wk later that showed improved SCr back down to 1.7.   He returns back today for f/u to reassess volume status. ***    ECG (personally reviewed): ***   REDS ***   Labs (1/19): K 4.9, creatinine 1.19 Labs (2/19): K 4.2, creatinine 0.89 Labs (3/19): LDL 86, HDL 45, TGs 245 Labs (5/19): LDL 110, HDL 39 Labs (6/19): K 4.1, creatinine 1.07 Labs (11/19): LDL 14, HDL 56 Labs (6/20): K 4.1, creatinine 1.0 Labs (7/20):  K 4.3, creatinine 1.07 Labs (9/21): LDL 129, hgb 14.8, K 4.1, creatinine 1.02 Labs (12/21): LDL 20, HDL 46, K 4.4, creatinine 1.61 Labs (1/22): LDL 45, HDL 49, K 4.3, creatinine 1.1, BNP 184 Labs (3/22): K 4.4, creatinine 1.10, LDL 15, HDL 49 Labs (6/22): K 4.3, creatinine 1.11, LDL 121 Labs (7/22): K 4.3, creatinine 1.23, hgb 14.5 Labs (11/22): K 4.5, creatinine 1.14 Labs (2/23): K 4.3, creatinine 1.16 Labs (7/23): K 4.4, creatinine 1.24 Labs (9/23): K 4.1, creatinine 1.19 Labs (10/23): K 4.2, creatinine 1.31 Labs (1/24): K 4.8, SCr 1.13, Hgb A1c 7.7 Labs (4/24): K 5, creatinine 1.56 => 1.33, BNP 726 Labs (5/24): K 4.7, SCr 1.29 BNP 895 Labs (5/24): K 4.9, SCr 2.14, BNP 398 Labs (6/24): K 4.6, SCr 1.66   PMH: 1. PAD: Peripheral arterial dopplers in 2012 showed > 75% bilateral SFA stenosis. Peripheral arterial dopplers in 10/14 showed > 50% focal bilateral SFA stenoses.  2. AAA: [place] 4/15 with 4.4 cm AAA but concern for penetrating ulcers. CTA abdomen showed 4.4 cm AAA with penetrating ulcers and possible pseudoaneurysms. Now s/p endovascular repair of AAA in 5/15 with no complications.  3. Carotid stenosis: Carotid dopplers (4/15) with 60-79% bilateral ICA stenosis. Carotid dopplers (11/15) with 60-79% bilateral ICA stenosis.  Carotid dopplers 6/16 with 60-79% bilateral ICA stenosis. - Carotid dopplers (1/18): 60-79% RICA stenosis.  - Carotid dopplers (1/19): 80-99% RICA stenosis => Right CEA in 2/19.  - Carotid dopplers (11/19): 40-59% bilateral stenosis.  - Carotid dopplers (1/21): 1-39% BICA stenosis.  4. CAD: CABG 1989 with LIMA-LAD, SVG-D, seq SVG-ramus and OM, sequential SVG-PDA/PLV. SVG-PDA/PLV found to be occluded on cath prior to AVR, so patient had SVG-PDA with AVR in 2007. LHC (7/15) with LIMA-LAD patent with 40-50% stenosis in LAD after touchdown, sequential SVG-ramus/OM/diagonal with only the ramus branch still intact (known from prior cath), SVG-PDA from original surgery TO,  SVG-PDA from redo surgery with long 50-60% mid-graft stenosis.  No target for intervention.  - LHC (9/23): known occlusion of native  coronaries and SVG-RCA from original CABG, new occlusion of SVG-PDA from CABG#2, long 99% stenosis in the SVG-ramus (was sequential SVG-ramus/OM/D but OM and D branches noted to be lost on prior cath), ramus is relatively small vessel, patent LIMA-LAD with collaterals to RCA territory. Case reviewed with Dr. Lynnette [name], probably not much benefit to intervention on SVG-ramus. Small territory covered and complex disease in SVG. Medical management advised. 5. Severe aortic stenosis: Bioprosthetic AVR in 2007.  6. Atrial fibrillation: Paroxysmal. Initially noted after cardiac surgery, then again after AAA repair.  DCCV to NSR 06/06/14.  DCCV to NSR again 08/21/14.  - DCCV to NSR in 1/19.  - DCCV to NSR in 6/20.  - Now on Tikosyn.  - DCCV to NSR 12/21, 2/22, 3/22, 4/23, 9/23, 10/23 7. Type II diabetes  8. Gout  9. GERD  10. Hyperlipidemia  11. HTN 12. GERD  13. Chronic systolic CHF: Echo (6/15) with EF worsened to 25-30% with grade II diastolic dysfunction, stable bioprosthetic aortic valve, mildly dilated RV with normal systolic function.  Echo (1/16) with EF 55-60%, basal inferior hypokinesis, mild LVH, bioprosthetic aortic valve with mean gradient 13 mmHg, mild Nathaniel, severe LAE.  - Echo (1/19): EF 45-50%, mild Nathaniel, normal bioprosthetic aortic valve.  - TEE (6/20): EF 50% with mild diffuse hypokinesis, mildly decreased RV systolic function with mild RV enlargement, bioprosthetic aortic valve looked ok, mild Nathaniel. - Echo (1/22):  EF 45-50%, diffuse hypokinesis, mildly decreased RV systolic function, moderate biatrial enlargement, bioprosthetic valve with mean gradient 8 mmHg.   - TEE (2/22): EF 45-50%, RV normal, moderate biatrial enlargement, bioprosthetic AoV with mean gradient 6 mmHg, mild Nathaniel.  - TEE (4/23): EF 50-55%, mildly decreased RV function, normal bioprosthetic aortic  valve, mild Nathaniel and mild mitral stenosis (mean gradient 3 mmHg) - TEE (9/23): EF 45%, RV normal, no LAA thrombus, no PFO/ASD, mild to moderate Nathaniel w/ moderate MAC, AoV without stenosis/regurgitation - TEE (10/23): EF 45%, RV mildly decreased, no LAA thrombus, no PFO/ASD, moderate Nathaniel, AoV without stenosis/regurgitation, mean gradient 6 mmHg. - Echo 5/24 EF 30-35%. May be related to a fib. RV mildly reduced, Nathaniel moderate-severe.  14. BPH  15. Nonsmall cell lung cancer: Suspected by imaging, treated in 2023 with radiation.  16. Fall with rhabdomyolysis (10/22)   SH: Widower, 2 children, raised his 3 grand-daughters, lives in [place], [place].  Occasional ETOH, no smoking.   FH: Father died from ruptured AAA.   ROS: All systems reviewed and negative except as per HPI.   Current Outpatient Medications  Medication Sig Dispense Refill   atorvastatin (LIPITOR) 20 MG tablet Take 1 tablet (20 mg total) by mouth daily. Alternating with 30mg  30 tablet 5   Coenzyme Q10 (CO Q10) 200 MG CAPS Take 200 mg by mouth daily.     dorzolamide-timolol (COSOPT) 22.3-6.8 MG/ML ophthalmic solution Place 1 drop into both eyes every evening.     ezetimibe (ZETIA) 10 MG tablet Take 1 tablet (10 mg total) by mouth at bedtime. 90 tablet 3   latanoprost (XALATAN) 0.005 % ophthalmic solution Place 1 drop into both eyes at bedtime.     METAMUCIL FIBER PO Take 1 Dose by mouth 2 (two) times a week.     metoprolol succinate (TOPROL-XL) 50 MG 24 hr tablet Take 1 tablet (50 mg total) by mouth in the morning and at bedtime. Take with or immediately following a meal. 60 tablet 3   Misc Natural Products (GLUCOSAMINE CHONDROITIN TRIPLE) TABS Take 1 tablet by  mouth daily.     potassium chloride SA (KLOR-CON M) 20 MEQ tablet Take 1 tablet (20 mEq total) by mouth daily. 90 tablet 2   ramipril (ALTACE) 2.5 MG capsule Take 1 capsule (2.5 mg total) by mouth daily. 30 capsule 3   REPATHA SURECLICK 140 MG/ML SOAJ INJECT 1 PEN INTO SKIN  EVERY 14 DAYS 2 mL 11   spironolactone (ALDACTONE) 25 MG tablet Take 1 tablet (25 mg total) by mouth daily. 30 tablet 5   tamsulosin (FLOMAX) 0.4 MG CAPS capsule TAKE ONE CAPSULE BY MOUTH TWICE DAILY 180 capsule 3   torsemide (DEMADEX) 20 MG tablet Take 2 tablets (40 mg total) by mouth 2 (two) times daily. 90 tablet 5   warfarin (COUMADIN) 3 MG tablet TAKE 2 TO 3 TABLETS DAILY OR AS DIRECTED BY COUMADIN CLINIC. PLEASE KEEP UPCOMING APPT 20 tablet 0   No current facility-administered medications for this visit.   There were no vitals taken for this visit.   Wt Readings from Last 3 Encounters:  05/12/23 79.9 kg (176 lb 3.2 oz)  04/27/23 80.2 kg (176 lb 12.8 oz)  04/23/23 82.6 kg (182 lb)   PHYSICAL EXAM: General:  Well appearing. No respiratory difficulty HEENT: normal Neck: supple. no JVD. Carotids 2+ bilat; no bruits. No lymphadenopathy or thyromegaly appreciated. Cor: PMI nondisplaced. Regular rate & rhythm. No rubs, gallops or murmurs. Lungs: clear Abdomen: soft, nontender, nondistended. No hepatosplenomegaly. No bruits or masses. Good bowel sounds. Extremities: no cyanosis, clubbing, rash, edema Neuro: alert & oriented x 3, cranial nerves grossly intact. moves all 4 extremities w/o difficulty. Affect pleasant.   Assessment/Plan:  1.  Chronic Systolic Heart Failure: Echo 09/23 with stable EF 45-50%, LV global hypokinesis, RV systolic function normal. Filling pressures normal on RHC (9/23). TEE in 10/23 showed EF 45%, diffuse HK, RV mildly decreased, moderate Nathaniel, bioprosthetic AoV stable with mean gradient 6 mmHg, no LAA. He has been persistently in atrial fibrillation since the end of October. Echo 5/24 EF 30-35%, RV mildly reduced, Nathaniel moderate-severe. Suspect further drop in EF is tachy mediated.  - NYHA Class  - Volume *** ReDs ***   - Continue ramipril 2.5 daily.  - Continue Toprol XL 50 mg bid.  - Continue torsemide 40 mg bid  - Continue spironolactone 25 mg daily. - Off  Jardiance with h/o E coli UTI.  - Check BMP and BNP today  2.  CAD: s/p CABG and Redo CABG. LHC 06/2014 showed significant coronary disease w/ no good interventional targets (LIMA-LAD patent with 40-50% stenosis in LAD after touchdown, sequential SVG-ramus/OM/diagonal with only the ramus branch still intact (known from prior cath), SVG-PDA from original surgery TO, SVG-PDA from redo surgery with long 50-60% mid-graft stenosis). NSTEMI 9/23, repeat LHC showed occlusion of the 2nd SVG-PDA and 99% stenosis in the SVG-ramus.  Probably not much benefit to intervention on SVG-ramus.  Small territory covered and complex disease in the SVG.  Plan medical management. No further chest pain. - Now off ASA with warfarin use. - Continue atorvastatin 20 mg QOD + Zetia.  - Continue Repatha. Check Lipid panel today ***  3. Atrial fibrillation: Difficult to control, failed multiple cardioversions.  Failed amiodarone due to CNS side effects, feels strongly against trying again. Offered ablation years ago by EP but he declined. Had been primarily in NSR on Tikosyn ultimately failed Tikosyn, stopped d/t futility.  In setting of NSTEMI, he developed recurrent AF. S/p TEE/DCCV 9/23 and 10/23 with successful conversion to NSR initially  but then back to AF. He saw Dr. Elberta Fortis who did not think he was a candidate for AF ablation at this point.  He is in atrial fibrillation today but rate is better controlled.  - Continue warfarin.  - Continue Toprol XL 50 mg bid for better rate control.  4. NSCLC: Completed SBRT.  Followed by radiation oncology. Followup CT in 8/23 reviewed with pulmonary, no acute changes 5. Bioprosthetic AVR: Stable on recent echo and TEE. 6. Type 2DM:  - off SLGT2i given UTIs.  - managed by PCP 7. PAD:  AAA s/p EVR, LE PAD + Carotid Artery Disease s/p Rt CEA. No current claudication symptoms.  - Followed by Dr. Myra Gianotti. - Continue lipid lowering agents per above.   Follow up:   Robbie Lis PA-C   06/22/2023

## 2023-06-23 ENCOUNTER — Encounter (HOSPITAL_COMMUNITY): Payer: Self-pay

## 2023-06-23 ENCOUNTER — Ambulatory Visit (HOSPITAL_COMMUNITY)
Admission: RE | Admit: 2023-06-23 | Discharge: 2023-06-23 | Disposition: A | Discharge status: Home / Self Care | Payer: Medicare Other | Source: Ambulatory Visit | Attending: Cardiology | Admitting: Cardiology

## 2023-06-23 VITALS — BP 96/60 | HR 91 | Wt 175.6 lb

## 2023-06-23 DIAGNOSIS — Z953 Presence of xenogenic heart valve: Secondary | ICD-10-CM | POA: Insufficient documentation

## 2023-06-23 DIAGNOSIS — Z7901 Long term (current) use of anticoagulants: Secondary | ICD-10-CM | POA: Diagnosis not present

## 2023-06-23 DIAGNOSIS — Z951 Presence of aortocoronary bypass graft: Secondary | ICD-10-CM | POA: Insufficient documentation

## 2023-06-23 DIAGNOSIS — I130 Hypertensive heart and chronic kidney disease with heart failure and stage 1 through stage 4 chronic kidney disease, or unspecified chronic kidney disease: Secondary | ICD-10-CM | POA: Insufficient documentation

## 2023-06-23 DIAGNOSIS — I5022 Chronic systolic (congestive) heart failure: Secondary | ICD-10-CM | POA: Insufficient documentation

## 2023-06-23 DIAGNOSIS — N189 Chronic kidney disease, unspecified: Secondary | ICD-10-CM | POA: Insufficient documentation

## 2023-06-23 DIAGNOSIS — R140 Abdominal distension (gaseous): Secondary | ICD-10-CM | POA: Diagnosis not present

## 2023-06-23 DIAGNOSIS — I252 Old myocardial infarction: Secondary | ICD-10-CM | POA: Insufficient documentation

## 2023-06-23 DIAGNOSIS — Z79899 Other long term (current) drug therapy: Secondary | ICD-10-CM | POA: Diagnosis not present

## 2023-06-23 DIAGNOSIS — E1122 Type 2 diabetes mellitus with diabetic chronic kidney disease: Secondary | ICD-10-CM | POA: Diagnosis not present

## 2023-06-23 DIAGNOSIS — I480 Paroxysmal atrial fibrillation: Secondary | ICD-10-CM | POA: Diagnosis not present

## 2023-06-23 DIAGNOSIS — Z8774 Personal history of (corrected) congenital malformations of heart and circulatory system: Secondary | ICD-10-CM | POA: Diagnosis not present

## 2023-06-23 DIAGNOSIS — I5042 Chronic combined systolic (congestive) and diastolic (congestive) heart failure: Secondary | ICD-10-CM

## 2023-06-23 DIAGNOSIS — Z923 Personal history of irradiation: Secondary | ICD-10-CM | POA: Diagnosis not present

## 2023-06-23 DIAGNOSIS — E1151 Type 2 diabetes mellitus with diabetic peripheral angiopathy without gangrene: Secondary | ICD-10-CM | POA: Diagnosis not present

## 2023-06-23 DIAGNOSIS — I25810 Atherosclerosis of coronary artery bypass graft(s) without angina pectoris: Secondary | ICD-10-CM | POA: Diagnosis not present

## 2023-06-23 DIAGNOSIS — C3490 Malignant neoplasm of unspecified part of unspecified bronchus or lung: Secondary | ICD-10-CM | POA: Diagnosis not present

## 2023-06-23 LAB — BASIC METABOLIC PANEL
Anion gap: 9 (ref 5–15)
BUN: 37 mg/dL — ABNORMAL HIGH (ref 8–23)
CO2: 25 mmol/L (ref 22–32)
Calcium: 8.8 mg/dL — ABNORMAL LOW (ref 8.9–10.3)
Chloride: 102 mmol/L (ref 98–111)
Creatinine, Ser: 1.71 mg/dL — ABNORMAL HIGH (ref 0.61–1.24)
GFR, Estimated: 38 mL/min — ABNORMAL LOW (ref >60)
Glucose, Bld: 128 mg/dL — ABNORMAL HIGH (ref 70–99)
Potassium: 4.6 mmol/L (ref 3.5–5.1)
Sodium: 136 mmol/L (ref 135–145)

## 2023-06-23 LAB — BRAIN NATRIURETIC PEPTIDE: B Natriuretic Peptide: 340.7 pg/mL — ABNORMAL HIGH (ref 0.0–100.0)

## 2023-06-23 MED ORDER — TORSEMIDE 20 MG PO TABS: ORAL_TABLET | ORAL | 5 refills | Status: DC

## 2023-06-23 NOTE — Progress Notes (Signed)
ReDS Vest / Clip - 06/23/23 0900       ReDS Vest / Clip   Station Marker C    Ruler Value 27    ReDS Value Range Moderate volume overload    ReDS Actual Value 39

## 2023-06-23 NOTE — Patient Instructions (Signed)
CHANGE Lasix to 60 mg ( 2 Tabs) in the morning and 40 mg ( 2 Tabs) in the evening.  Labs done today, your results will be available in MyChart, we will contact you for abnormal readings.  Repeat blood work in 7 days.  Your physician recommends that you schedule a follow-up appointment in: 2 months.  If you have any questions or concerns before your next appointment please send [place] a message through [place] or call our office at [phone].    TO LEAVE A MESSAGE FOR THE NURSE SELECT OPTION 2, PLEASE LEAVE A MESSAGE INCLUDING: YOUR NAME DATE OF BIRTH CALL BACK NUMBER REASON FOR CALL**this is important as we prioritize the call backs  YOU WILL RECEIVE A CALL BACK THE SAME DAY AS LONG AS YOU CALL BEFORE 4:00 PM  At the [facility], you and your health needs are our priority. As part of our continuing mission to provide you with exceptional heart care, we have created designated Provider Care Teams. These Care Teams include your primary Cardiologist (physician) and Advanced Practice Providers (APPs- Physician Assistants and Nurse Practitioners) who all work together to provide you with the care you need, when you need it.   You may see any of the following providers on your designated Care Team at your next follow up: Dr Arvilla Meres Dr [name] Dr. Marcos Eke, NP Robbie Lis, [place] [facility] [place], [place] Brynda Peon, NP Karle Plumber, PharmD   Please be sure to bring in all your medications bottles to every appointment.    Thank you for choosing [facility]

## 2023-07-01 ENCOUNTER — Ambulatory Visit (HOSPITAL_COMMUNITY)
Admission: RE | Admit: 2023-07-01 | Discharge: 2023-07-01 | Disposition: A | Discharge status: Home / Self Care | Payer: Medicare Other | Source: Ambulatory Visit | Attending: Cardiology | Admitting: Cardiology

## 2023-07-01 DIAGNOSIS — I5042 Chronic combined systolic (congestive) and diastolic (congestive) heart failure: Secondary | ICD-10-CM

## 2023-07-01 DIAGNOSIS — I5022 Chronic systolic (congestive) heart failure: Secondary | ICD-10-CM | POA: Diagnosis not present

## 2023-07-01 LAB — BASIC METABOLIC PANEL
Anion gap: 8 (ref 5–15)
BUN: 44 mg/dL — ABNORMAL HIGH (ref 8–23)
CO2: 25 mmol/L (ref 22–32)
Calcium: 9 mg/dL (ref 8.9–10.3)
Chloride: 101 mmol/L (ref 98–111)
Creatinine, Ser: 1.63 mg/dL — ABNORMAL HIGH (ref 0.61–1.24)
GFR, Estimated: 40 mL/min — ABNORMAL LOW (ref >60)
Glucose, Bld: 117 mg/dL — ABNORMAL HIGH (ref 70–99)
Potassium: 4.4 mmol/L (ref 3.5–5.1)
Sodium: 134 mmol/L — ABNORMAL LOW (ref 135–145)

## 2023-07-06 ENCOUNTER — Encounter: Payer: Self-pay | Admitting: Family Medicine

## 2023-07-06 ENCOUNTER — Ambulatory Visit: Payer: Medicare Other

## 2023-07-06 ENCOUNTER — Ambulatory Visit (INDEPENDENT_AMBULATORY_CARE_PROVIDER_SITE_OTHER): Payer: Medicare Other | Admitting: Family Medicine

## 2023-07-06 VITALS — BP 98/60 | HR 84 | Temp 98.5°F | Wt 174.8 lb

## 2023-07-06 VITALS — BP 98/60 | HR 84 | Temp 98.5°F | Ht 70.0 in | Wt 174.0 lb

## 2023-07-06 DIAGNOSIS — Z0000 Encounter for general adult medical examination without abnormal findings: Secondary | ICD-10-CM | POA: Diagnosis not present

## 2023-07-06 DIAGNOSIS — I10 Essential (primary) hypertension: Secondary | ICD-10-CM | POA: Diagnosis not present

## 2023-07-06 DIAGNOSIS — E118 Type 2 diabetes mellitus with unspecified complications: Secondary | ICD-10-CM | POA: Diagnosis not present

## 2023-07-06 DIAGNOSIS — E785 Hyperlipidemia, unspecified: Secondary | ICD-10-CM

## 2023-07-06 DIAGNOSIS — I4891 Unspecified atrial fibrillation: Secondary | ICD-10-CM

## 2023-07-06 DIAGNOSIS — Z7984 Long term (current) use of oral hypoglycemic drugs: Secondary | ICD-10-CM | POA: Diagnosis not present

## 2023-07-06 DIAGNOSIS — I25810 Atherosclerosis of coronary artery bypass graft(s) without angina pectoris: Secondary | ICD-10-CM | POA: Diagnosis not present

## 2023-07-06 LAB — LIPID PANEL
Cholesterol: 164 mg/dL (ref 0–200)
HDL: 39.3 mg/dL (ref >39.00)
LDL Cholesterol: 92 mg/dL (ref 0–99)
NonHDL: 125.16
Total CHOL/HDL Ratio: 4
Triglycerides: 167 mg/dL — ABNORMAL HIGH (ref 0.0–149.0)
VLDL: 33.4 mg/dL (ref 0.0–40.0)

## 2023-07-06 LAB — COMPREHENSIVE METABOLIC PANEL
ALT: 13 U/L (ref 0–53)
AST: 16 U/L (ref 0–37)
Albumin: 4.1 g/dL (ref 3.5–5.2)
Alkaline Phosphatase: 59 U/L (ref 39–117)
BUN: 31 mg/dL — ABNORMAL HIGH (ref 6–23)
CO2: 31 mEq/L (ref 19–32)
Calcium: 9.6 mg/dL (ref 8.4–10.5)
Chloride: 96 mEq/L (ref 96–112)
Creatinine, Ser: 1.33 mg/dL (ref 0.40–1.50)
GFR: 47.47 mL/min — ABNORMAL LOW (ref >60.00)
Glucose, Bld: 135 mg/dL — ABNORMAL HIGH (ref 70–99)
Potassium: 3.9 mEq/L (ref 3.5–5.1)
Sodium: 136 mEq/L (ref 135–145)
Total Bilirubin: 1.4 mg/dL — ABNORMAL HIGH (ref 0.2–1.2)
Total Protein: 7.2 g/dL (ref 6.0–8.3)

## 2023-07-06 LAB — CBC WITH DIFFERENTIAL/PLATELET
Basophils Absolute: 0.1 10*3/uL (ref 0.0–0.1)
Basophils Relative: 1.3 % (ref 0.0–3.0)
Eosinophils Absolute: 0.2 10*3/uL (ref 0.0–0.7)
Eosinophils Relative: 2.5 % (ref 0.0–5.0)
HCT: 38.9 % — ABNORMAL LOW (ref 39.0–52.0)
Hemoglobin: 12.6 g/dL — ABNORMAL LOW (ref 13.0–17.0)
Lymphocytes Relative: 18.4 % (ref 12.0–46.0)
Lymphs Abs: 1.2 10*3/uL (ref 0.7–4.0)
MCHC: 32.4 g/dL (ref 30.0–36.0)
MCV: 90.2 fl (ref 78.0–100.0)
Monocytes Absolute: 0.7 10*3/uL (ref 0.1–1.0)
Monocytes Relative: 10.6 % (ref 3.0–12.0)
Neutro Abs: 4.3 10*3/uL (ref 1.4–7.7)
Neutrophils Relative %: 67.2 % (ref 43.0–77.0)
Platelets: 183 10*3/uL (ref 150.0–400.0)
RBC: 4.31 Mil/uL (ref 4.22–5.81)
RDW: 15 % (ref 11.5–15.5)
WBC: 6.4 10*3/uL (ref 4.0–10.5)

## 2023-07-06 LAB — HEMOGLOBIN A1C: Hgb A1c MFr Bld: 7.4 % — ABNORMAL HIGH (ref 4.6–6.5)

## 2023-07-06 NOTE — Patient Instructions (Signed)
Nathaniel [name] , Thank you for taking time to come for your Medicare Wellness Visit. I appreciate your ongoing commitment to your health goals. Please review the following plan we discussed and let me know if I can assist you in the future.   These are the goals we discussed:  Goals   None     This is a list of the screening recommended for you and due dates:  Health Maintenance  Topic Date Due   Medicare Annual Wellness Visit  Never done   Eye exam for diabetics  Never done   Zoster (Shingles) Vaccine (1 of 2) Never done   Pneumonia Vaccine (1 of 1 - PCV) Never done   COVID-19 Vaccine (3 - Pfizer risk series) 10/08/2020   Hemoglobin A1C  07/09/2023   Flu Shot  07/15/2023   Complete foot exam   01/09/2024   HPV Vaccine  Aged Out   DTaP/Tdap/Td vaccine  Discontinued   Colon Cancer Screening  Discontinued    Advanced directives: Please bring a copy of your health care power of attorney and living will to the office at your convenience.  Conditions/risks identified: lose a few inches off waist   Next appointment: Follow up in one year for your annual wellness visit.   Preventive Care 35 Years and Older, Male  Preventive care refers to lifestyle choices and visits with your health care provider that can promote health and wellness. What does preventive care include? A yearly physical exam. This is also called an annual well check. Dental exams once or twice a year. Routine eye exams. Ask your health care provider how often you should have your eyes checked. Personal lifestyle choices, including: Daily care of your teeth and gums. Regular physical activity. Eating a healthy diet. Avoiding tobacco and drug use. Limiting alcohol use. Practicing safe sex. Taking low doses of aspirin every day. Taking vitamin and mineral supplements as recommended by your health care provider. What happens during an annual well check? The services and screenings done by your health care provider  during your annual well check will depend on your age, overall health, lifestyle risk factors, and family history of disease. Counseling  Your health care provider may ask you questions about your: Alcohol use. Tobacco use. Drug use. Emotional well-being. Home and relationship well-being. Sexual activity. Eating habits. History of falls. Memory and ability to understand (cognition). Work and work Astronomer. Screening  You may have the following tests or measurements: Height, weight, and BMI. Blood pressure. Lipid and cholesterol levels. These may be checked every 5 years, or more frequently if you are over 11 years old. Skin check. Lung cancer screening. You may have this screening every year starting at age 73 if you have a 30-pack-year history of smoking and currently smoke or have quit within the past 15 years. Fecal occult blood test (FOBT) of the stool. You may have this test every year starting at age 52. Flexible sigmoidoscopy or colonoscopy. You may have a sigmoidoscopy every 5 years or a colonoscopy every 10 years starting at age 58. Prostate cancer screening. Recommendations will vary depending on your family history and other risks. Hepatitis C blood test. Hepatitis B blood test. Sexually transmitted disease (STD) testing. Diabetes screening. This is done by checking your blood sugar (glucose) after you have not eaten for a while (fasting). You may have this done every 1-3 years. Abdominal aortic aneurysm (AAA) screening. You may need this if you are a current or former smoker. Osteoporosis. You  may be screened starting at age 36 if you are at high risk. Talk with your health care provider about your test results, treatment options, and if necessary, the need for more tests. Vaccines  Your health care provider may recommend certain vaccines, such as: Influenza vaccine. This is recommended every year. Tetanus, diphtheria, and acellular pertussis (Tdap, Td) vaccine. You  may need a Td booster every 10 years. Zoster vaccine. You may need this after age 18. Pneumococcal 13-valent conjugate (PCV13) vaccine. One dose is recommended after age 93. Pneumococcal polysaccharide (PPSV23) vaccine. One dose is recommended after age 57. Talk to your health care provider about which screenings and vaccines you need and how often you need them. This information is not intended to replace advice given to you by your health care provider. Make sure you discuss any questions you have with your health care provider. Document Released: 12/27/2015 Document Revised: 08/19/2016 Document Reviewed: 10/01/2015 Elsevier Interactive Patient Education  2017 [employer].  Fall Prevention in the Home Falls can cause injuries. They can happen to people of all ages. There are many things you can do to make your home safe and to help prevent falls. What can I do on the outside of my home? Regularly fix the edges of walkways and driveways and fix any cracks. Remove anything that might make you trip as you walk through a door, such as a raised step or threshold. Trim any bushes or trees on the path to your home. Use bright outdoor lighting. Clear any walking paths of anything that might make someone trip, such as rocks or tools. Regularly check to see if handrails are loose or broken. Make sure that both sides of any steps have handrails. Any raised decks and porches should have guardrails on the edges. Have any leaves, snow, or ice cleared regularly. Use sand or salt on walking paths during winter. Clean up any spills in your garage right away. This includes oil or grease spills. What can I do in the bathroom? Use night lights. Install grab bars by the toilet and in the tub and shower. Do not use towel bars as grab bars. Use non-skid mats or decals in the tub or shower. If you need to sit down in the shower, use a plastic, non-slip stool. Keep the floor dry. Clean up any water that spills  on the floor as soon as it happens. Remove soap buildup in the tub or shower regularly. Attach bath mats securely with double-sided non-slip rug tape. Do not have throw rugs and other things on the floor that can make you trip. What can I do in the bedroom? Use night lights. Make sure that you have a light by your bed that is easy to reach. Do not use any sheets or blankets that are too big for your bed. They should not hang down onto the floor. Have a firm chair that has side arms. You can use this for support while you get dressed. Do not have throw rugs and other things on the floor that can make you trip. What can I do in the kitchen? Clean up any spills right away. Avoid walking on wet floors. Keep items that you use a lot in easy-to-reach places. If you need to reach something above you, use a strong step stool that has a grab bar. Keep electrical cords out of the way. Do not use floor polish or wax that makes floors slippery. If you must use wax, use non-skid floor wax. Do  not have throw rugs and other things on the floor that can make you trip. What can I do with my stairs? Do not leave any items on the stairs. Make sure that there are handrails on both sides of the stairs and use them. Fix handrails that are broken or loose. Make sure that handrails are as long as the stairways. Check any carpeting to make sure that it is firmly attached to the stairs. Fix any carpet that is loose or worn. Avoid having throw rugs at the top or bottom of the stairs. If you do have throw rugs, attach them to the floor with carpet tape. Make sure that you have a light switch at the top of the stairs and the bottom of the stairs. If you do not have them, ask someone to add them for you. What else can I do to help prevent falls? Wear shoes that: Do not have high heels. Have rubber bottoms. Are comfortable and fit you well. Are closed at the toe. Do not wear sandals. If you use a stepladder: Make  sure that it is fully opened. Do not climb a closed stepladder. Make sure that both sides of the stepladder are locked into place. Ask someone to hold it for you, if possible. Clearly mark and make sure that you can see: Any grab bars or handrails. First and last steps. Where the edge of each step is. Use tools that help you move around (mobility aids) if they are needed. These include: Canes. Walkers. Scooters. Crutches. Turn on the lights when you go into a dark area. Replace any light bulbs as soon as they burn out. Set up your furniture so you have a clear path. Avoid moving your furniture around. If any of your floors are uneven, fix them. If there are any pets around you, be aware of where they are. Review your medicines with your doctor. Some medicines can make you feel dizzy. This can increase your chance of falling. Ask your doctor what other things that you can do to help prevent falls. This information is not intended to replace advice given to you by your health care provider. Make sure you discuss any questions you have with your health care provider. Document Released: 09/26/2009 Document Revised: 05/07/2016 Document Reviewed: 01/04/2015 Elsevier Interactive Patient Education  2017 [employer].

## 2023-07-06 NOTE — Progress Notes (Signed)
Phone [phone] In person visit   Subjective:   Nathaniel [name]. is a 89 y.o. year old very pleasant male patient who presents for/with See problem oriented charting Chief Complaint  Patient presents with   Medical Management of Chronic Issues   Diabetes    DEE requested.   Past Medical History-  Patient Active Problem List   Diagnosis Date Noted   Controlled type 2 diabetes mellitus with complication, without long-term current use of insulin (HCC) 02/19/2022    Priority: High   Afib (HCC) 06/12/2019    Priority: High   Chronic combined systolic and diastolic heart failure (HCC) 06/12/2014    Priority: High   CAD (coronary artery disease) of artery bypass graft 09/14/2012    Priority: High   Carotid artery disease without cerebral infarction (HCC) 09/14/2012    Priority: High   History of aortic valve replacement with bioprosthetic valve 09/14/2012    Priority: High   Aortic atherosclerosis (HCC) 02/19/2022    Priority: Medium    Chronic gout without tophus 02/19/2022    Priority: Medium    BPH (benign prostatic hyperplasia) 10/11/2021    Priority: Medium    Asymptomatic carotid artery stenosis without infarction, right 02/02/2018    Priority: Medium    Solitary pulmonary nodule 03/23/2016    Priority: Medium    Pulmonary nodule 12/25/2015    Priority: Medium    AAA (abdominal aortic aneurysm) without rupture (HCC) 04/13/2014    Priority: Medium    Occlusion and stenosis of carotid artery without mention of cerebral infarction 04/09/2014    Priority: Medium    Abdominal aortic aneurysm (HCC) 09/14/2012    Priority: Medium    Peripheral vascular disease with claudication (HCC) 09/14/2012    Priority: Medium    Essential hypertension 09/14/2012    Priority: Medium    Hyperlipidemia with target LDL less than 70 09/14/2012    Priority: Medium    Secondary hypercoagulable state (HCC) 02/19/2022    Priority: Low   Rhabdomyolysis 10/10/2021    Priority: Low    Aspiration of foreign body     Priority: Low   Foreign body aspiration 12/23/2015    Priority: Low   Encounter for therapeutic drug monitoring 03/28/2015    Priority: Low   NSTEMI (non-ST elevated myocardial infarction) (HCC) 08/20/2022   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) 04/01/2022    Medications- reviewed and updated Current Outpatient Medications  Medication Sig Dispense Refill   atorvastatin (LIPITOR) 20 MG tablet Take 1 tablet (20 mg total) by mouth daily. Alternating with 30mg  30 tablet 5   Coenzyme Q10 (CO Q10) 200 MG CAPS Take 200 mg by mouth daily.     dorzolamide-timolol (COSOPT) 22.3-6.8 MG/ML ophthalmic solution Place 1 drop into both eyes every evening.     ezetimibe (ZETIA) 10 MG tablet Take 1 tablet (10 mg total) by mouth at bedtime. 90 tablet 3   latanoprost (XALATAN) 0.005 % ophthalmic solution Place 1 drop into both eyes at bedtime.     METAMUCIL FIBER PO Take 1 Dose by mouth 2 (two) times a week. As needed (Patient not taking: Reported on 07/06/2023)     metoprolol succinate (TOPROL-XL) 50 MG 24 hr tablet Take 1 tablet (50 mg total) by mouth in the morning and at bedtime. Take with or immediately following a meal. 60 tablet 3   Misc Natural Products (GLUCOSAMINE CHONDROITIN TRIPLE) TABS Take 1 tablet by mouth daily.     potassium chloride SA (  KLOR-CON M) 20 MEQ tablet Take 1 tablet (20 mEq total) by mouth daily. 90 tablet 2   ramipril (ALTACE) 2.5 MG capsule Take 1 capsule (2.5 mg total) by mouth daily. 30 capsule 3   REPATHA SURECLICK 140 MG/ML SOAJ INJECT 1 PEN INTO SKIN EVERY 14 DAYS (Patient not taking: Reported on 07/06/2023) 2 mL 11   spironolactone (ALDACTONE) 25 MG tablet Take 1 tablet (25 mg total) by mouth daily. 30 tablet 5   tamsulosin (FLOMAX) 0.4 MG CAPS capsule TAKE ONE CAPSULE BY MOUTH TWICE DAILY 180 capsule 3   torsemide (DEMADEX) 20 MG tablet Take 3 tablets (60 mg total) by mouth every morning AND 2 tablets (40 mg total) every evening. 200  tablet 5   warfarin (COUMADIN) 3 MG tablet TAKE 2 TO 3 TABLETS DAILY OR AS DIRECTED BY COUMADIN CLINIC. PLEASE KEEP UPCOMING APPT 20 tablet 0   No current facility-administered medications for this visit.     Objective:  BP 98/60   Pulse 84   Temp 98.5 F (36.9 C)   Ht 5\' 10"  (1.778 m)   Wt 174 lb (78.9 kg)   SpO2 92%   BMI 24.97 kg/m  Gen: NAD, resting comfortably CV: RRR no murmurs rubs or gallops Lungs: CTAB no crackles, wheeze, rhonchi Abdomen: soft/nontender/nondistended/normal bowel sounds.  Ext: trace edema Skin: warm, dry     Assessment and Plan   #Health maintenance - recommend prevnar 20- delines  #CHF-systolic and diastolic mild-follows with Dr. Shirlee Latch #hypertension S: medication: spironolactone 25 mg daily (per patient) metoprolol 50 mg XR, ramipril 2.5 mg BID and torsemide 60 mg in morning and 40 mg in the evening Home readings #s: usually high 90's or low 100s at home but does not make him feel poorly A/P: CHF-euvolemic - continue current medications and cardiology follow up  Hypertension - continued lower end blood pressure but does not efeel poorly- mainly on medications to help with CHF- doing well on them so continue current medications   #CAD- CABG and redo CABG. Cath 08/21/22- still 99% stenosis in the SVG- ramus -also has bioprosthetic aortic valve replacement history #hyperlipidemia/aortic atherosclerosis S: Medication:Zetia 10 mg at bedtime,  atorvastatin 20 mg and 30 mg on alternating days (coQ10), also prescribed repatha- has a hard time mentally self injecting- and #s go up- has been off recently  Not on aspirin as a result of being on coumadin -no chest pain . Stable shortness of breath. No edema or weight gain  Lab Results  Component Value Date   CHOL 81 08/22/2022   HDL 36 (L) 08/22/2022   LDLCALC 26 08/22/2022   LDLDIRECT 98 05/08/2009   TRIG 97 08/22/2022   CHOLHDL 2.3 08/22/2022  A/P: CAD-largely stable with stable dyspnea on exertion-  continue current medications  Cholesterol- likely worsening off of Repatha- strongly encouraged to restart- will check lipids today to see where things tand without medicine  # Persistent atrial fibrillation-sees Dr. Elberta Fortis S:medication:  metoprolol 50 mg XL, coumadin - had been managed by cardiology clinic -history of cardioversion - failed conversion -Tikosyn stopped October/November 2024 and opted for rate control -CNS effects on amiodarone in past A/P: appropriately anticoagulated and rate controlled- continue current medicine   # Diabetes- prior followed by Dr. Leslie Dales A1C- 05/20/2021- 7.0 S: Medication:none  -UTI on jardiance Lab Results  Component Value Date   HGBA1C 7.7 (H) 01/08/2023   HGBA1C 6.9 (H) 06/23/2022   HGBA1C 6.5 05/08/2009   A/P: poor control last visit- plan was to  work on healthy eating and regular exercise - if still high today we discussed low dose metformin- at least want under 7.5   # BPH S:medication: tamsulosin 0.4mg  BID  -prior cardura  A/P: stable- continue current medicines    #Gout- denies flares even without medicine  # Lung cancer- with lung nodule 01/05/18 first detected -showed worsening- refer to [facility]iscisiplinary clinic - likely cancer-was treated with SBRT no surgery -last scan 11/24/22- with planned 3 month repeat as slight interval increase in a few lymph nodes -today we discussed "Call Dr. Marjory Lies office back- looks liek you were supposed to have another scan and did not yet get completed- they can help you set this up "  #Prior gross hematruia may 2023- we have discussed potential cancer risk but he declines follow up to urology (previously referred) - has not had recurrence  and declines referral again today  Recommended follow up: Return in about 6 months (around 01/06/2024) for followup or sooner if needed.Schedule b4 you leave. Future Appointments  Date Time Provider Department Center  07/08/2023  8:45 AM [facility]  [facility] None  09/10/2023  8:40 AM Laurey Morale, MD [facility] None  10/28/2023  9:45 AM Regan Lemming, MD [facility]  07/11/2024 10:15 AM [facility] ANNUAL WELLNESS VISIT 1 [facility]    Lab/Order associations: fasting   ICD-10-CM   1. Coronary artery disease involving coronary bypass graft of native heart without angina pectoris  I25.810     2. Atrial fibrillation, unspecified type (HCC)  I48.91     3. Controlled type 2 diabetes mellitus with complication, without long-term current use of insulin (HCC)  E11.8 Comprehensive metabolic panel    CBC with Differential/Platelet    Lipid panel    Hemoglobin A1c    4. Essential hypertension  I10 Comprehensive metabolic panel    CBC with Differential/Platelet    Lipid panel    5. Hyperlipidemia with target LDL less than 70  E78.5 Comprehensive metabolic panel    CBC with Differential/Platelet    Lipid panel      No orders of the defined types were placed in this encounter.   Return precautions advised.  Tana Conch, MD

## 2023-07-06 NOTE — Patient Instructions (Addendum)
Let Nathaniel [name] know when you have gotten your [facility] and pneumonia vaccines at the pharmacy or COVID vaccines.  Call Dr. Marjory Lies office back- looks like you were supposed to have another scan and did not yet get completed- they can help you set this up   Please stop by lab before you go If you have mychart- we will send your results within 3 business days of Nathaniel [name] receiving them.  If you do not have mychart- we will call you about results within 5 business days of Nathaniel [name] receiving them.  *please also note that you will see labs on mychart as soon as they post. I will later go in and write notes on them- will say "notes from Dr. Durene Cal"   Recommended follow up: Return in about 6 months (around 01/06/2024) for followup or sooner if needed.Schedule b4 you leave.

## 2023-07-06 NOTE — Progress Notes (Signed)
Subjective:   Nathaniel [name]. is a 89 y.o. male who presents for an Initial Medicare Annual Wellness Visit.  Visit Complete: In person  Review of Systems     Cardiac Risk Factors include: advanced age (>52men, >70 women);hypertension;diabetes mellitus;dyslipidemia;male gender     Objective:    Today's Vitals   07/06/23 1024  BP: 98/60  Pulse: 84  Temp: 98.5 F (36.9 C)  SpO2: 92%  Weight: 174 lb 12.8 oz (79.3 kg)   Body mass index is 25.08 kg/m.     07/06/2023   10:42 AM 11/24/2022   10:49 AM 11/11/2022   11:34 AM 09/23/2022    9:32 AM 08/20/2022   11:24 PM 08/20/2022    9:41 AM 08/11/2022    8:15 AM  Advanced Directives  Does Patient Have a Medical Advance Directive? Yes Yes Yes Yes Yes Yes Yes  Type of Estate agent of [place];Living will  Living will;Healthcare Power of [employer] Power of [place];Living will Living will  Living will;Healthcare Power of Attorney  Does patient want to make changes to medical advance directive?     No - Patient declined    Copy of Healthcare Power of Attorney in Chart? No - copy requested   No - copy requested       Current Medications (verified) Outpatient Encounter Medications as of 07/06/2023  Medication Sig   atorvastatin (LIPITOR) 20 MG tablet Take 1 tablet (20 mg total) by mouth daily. Alternating with 30mg    Coenzyme Q10 (CO Q10) 200 MG CAPS Take 200 mg by mouth daily.   dorzolamide-timolol (COSOPT) 22.3-6.8 MG/ML ophthalmic solution Place 1 drop into both eyes every evening.   ezetimibe (ZETIA) 10 MG tablet Take 1 tablet (10 mg total) by mouth at bedtime.   latanoprost (XALATAN) 0.005 % ophthalmic solution Place 1 drop into both eyes at bedtime.   metoprolol succinate (TOPROL-XL) 50 MG 24 hr tablet Take 1 tablet (50 mg total) by mouth in the morning and at bedtime. Take with or immediately following a meal.   Misc Natural Products (GLUCOSAMINE CHONDROITIN TRIPLE) TABS Take 1 tablet by mouth  daily.   potassium chloride SA (KLOR-CON M) 20 MEQ tablet Take 1 tablet (20 mEq total) by mouth daily.   ramipril (ALTACE) 2.5 MG capsule Take 1 capsule (2.5 mg total) by mouth daily.   spironolactone (ALDACTONE) 25 MG tablet Take 1 tablet (25 mg total) by mouth daily.   tamsulosin (FLOMAX) 0.4 MG CAPS capsule TAKE ONE CAPSULE BY MOUTH TWICE DAILY   torsemide (DEMADEX) 20 MG tablet Take 3 tablets (60 mg total) by mouth every morning AND 2 tablets (40 mg total) every evening.   warfarin (COUMADIN) 3 MG tablet TAKE 2 TO 3 TABLETS DAILY OR AS DIRECTED BY COUMADIN CLINIC. PLEASE KEEP UPCOMING APPT   METAMUCIL FIBER PO Take 1 Dose by mouth 2 (two) times a week. As needed (Patient not taking: Reported on 07/06/2023)   REPATHA SURECLICK 140 MG/ML SOAJ INJECT 1 PEN INTO SKIN EVERY 14 DAYS (Patient not taking: Reported on 07/06/2023)   No facility-administered encounter medications on file as of 07/06/2023.    Allergies (verified) Barbiturates, Grass extracts [gramineae pollens], Milk-related compounds, and Amiodarone   History: Past Medical History:  Diagnosis Date   AAA (abdominal aortic aneurysm) (HCC)    Atrial fibrillation (HCC)    Atrial fibrillation (HCC)    CAD (coronary artery disease)    Cataract    CHF (congestive heart failure) (HCC)  Chronic kidney disease    Colon polyps    adenomatous   Diabetes mellitus    Diverticulosis    Dysrhythmia    A-Fib   GERD (gastroesophageal reflux disease)    Gout    Hernia, abdominal    History of aortic valve replacement with bioprosthetic valve    2007   Hx: UTI (urinary tract infection)    Hyperlipemia    Hypertension    Internal hemorrhoids    Peripheral vascular disease (HCC)    Shortness of breath    with exertion   Past Surgical History:  Procedure Laterality Date   ABDOMINAL AORTIC ENDOVASCULAR STENT GRAFT N/A 04/13/2014   Procedure: ABDOMINAL AORTIC ENDOVASCULAR STENT GRAFT;  Surgeon: Nada Libman, MD;  Location: [facility] OR;   Service: Vascular;  Laterality: N/A;   ANGIOPLASTY     Unsure if he had stents put in    APPENDECTOMY     CARDIAC VALVE SURGERY     CARDIOVERSION N/A 06/06/2014   Procedure: CARDIOVERSION;  Surgeon: Laurey Morale, MD;  Location: [facility] ENDOSCOPY;  Service: Cardiovascular;  Laterality: N/A;   CARDIOVERSION N/A 08/21/2014   Procedure: CARDIOVERSION;  Surgeon: Laurey Morale, MD;  Location: [facility] ENDOSCOPY;  Service: Cardiovascular;  Laterality: N/A;   CARDIOVERSION N/A 12/22/2017   Procedure: CARDIOVERSION;  Surgeon: Laurey Morale, MD;  Location: [facility] ENDOSCOPY;  Service: Cardiovascular;  Laterality: N/A;   CARDIOVERSION N/A 05/25/2019   Procedure: CARDIOVERSION;  Surgeon: Laurey Morale, MD;  Location: [facility] ENDOSCOPY;  Service: Cardiovascular;  Laterality: N/A;   CARDIOVERSION N/A 06/14/2019   Procedure: CARDIOVERSION;  Surgeon: Jake Bathe, MD;  Location: [facility] ENDOSCOPY;  Service: Cardiovascular;  Laterality: N/A;   CARDIOVERSION N/A 12/12/2020   Procedure: CARDIOVERSION;  Surgeon: Laurey Morale, MD;  Location: [facility] ENDOSCOPY;  Service: Cardiovascular;  Laterality: N/A;   CARDIOVERSION N/A 01/29/2021   Procedure: CARDIOVERSION;  Surgeon: Laurey Morale, MD;  Location: [facility] ENDOSCOPY;  Service: Cardiovascular;  Laterality: N/A;   CARDIOVERSION N/A 03/19/2022   Procedure: CARDIOVERSION;  Surgeon: Laurey Morale, MD;  Location: [facility] ENDOSCOPY;  Service: Cardiovascular;  Laterality: N/A;   CARDIOVERSION N/A 08/24/2022   Procedure: CARDIOVERSION;  Surgeon: Laurey Morale, MD;  Location: [facility] ENDOSCOPY;  Service: Cardiovascular;  Laterality: N/A;   CARDIOVERSION N/A 09/23/2022   Procedure: CARDIOVERSION;  Surgeon: Laurey Morale, MD;  Location: [facility] ENDOSCOPY;  Service: Cardiovascular;  Laterality: N/A;   CORONARY ARTERY BYPASS GRAFT     ENDARTERECTOMY Right 02/02/2018   Procedure: ENDARTERECTOMY CAROTID RIGHT;  Surgeon: Nada Libman, MD;  Location: [facility] OR;  Service: Vascular;  Laterality: Right;   EYE  SURGERY Bilateral    cataract surgery   HERNIA REPAIR     2 operations   LEFT HEART CATHETERIZATION WITH CORONARY/GRAFT ANGIOGRAM N/A 07/10/2014   Procedure: LEFT HEART CATHETERIZATION WITH Isabel Caprice;  Surgeon: Laurey Morale, MD;  Location: [facility] CATH LAB;  Service: Cardiovascular;  Laterality: N/A;   RIGHT HEART CATH AND CORONARY/GRAFT ANGIOGRAPHY N/A 08/21/2022   Procedure: RIGHT HEART CATH AND CORONARY/GRAFT ANGIOGRAPHY;  Surgeon: Laurey Morale, MD;  Location: [facility] INVASIVE CV LAB;  Service: Cardiovascular;  Laterality: N/A;   TEE WITHOUT CARDIOVERSION N/A 05/25/2019   Procedure: TRANSESOPHAGEAL ECHOCARDIOGRAM (TEE);  Surgeon: Laurey Morale, MD;  Location: [facility] ENDOSCOPY;  Service: Cardiovascular;  Laterality: N/A;   TEE WITHOUT CARDIOVERSION N/A 01/29/2021   Procedure: TRANSESOPHAGEAL ECHOCARDIOGRAM (TEE);  Surgeon: Laurey Morale, MD;  Location: [facility] ENDOSCOPY;  Service: Cardiovascular;  Laterality: N/A;   TEE WITHOUT CARDIOVERSION N/A 03/19/2022   Procedure: TRANSESOPHAGEAL ECHOCARDIOGRAM (TEE);  Surgeon: Laurey Morale, MD;  Location: [facility] ENDOSCOPY;  Service: Cardiovascular;  Laterality: N/A;   TEE WITHOUT CARDIOVERSION N/A 08/24/2022   Procedure: TRANSESOPHAGEAL ECHOCARDIOGRAM (TEE);  Surgeon: Laurey Morale, MD;  Location: [facility] ENDOSCOPY;  Service: Cardiovascular;  Laterality: N/A;   TEE WITHOUT CARDIOVERSION N/A 09/23/2022   Procedure: TRANSESOPHAGEAL ECHOCARDIOGRAM (TEE);  Surgeon: Laurey Morale, MD;  Location: [facility] ENDOSCOPY;  Service: Cardiovascular;  Laterality: N/A;   TONSILLECTOMY     VIDEO BRONCHOSCOPY Bilateral 12/25/2015   Procedure: VIDEO BRONCHOSCOPY WITHOUT FLUORO;  Surgeon: Roslynn Amble, MD;  Location: [facility] ENDOSCOPY;  Service: Cardiopulmonary;  Laterality: Bilateral;   Family History  Problem Relation Age of Onset   Heart disease Mother    Heart disease Father        before age 39   AAA (abdominal aortic aneurysm) Father    Melanoma Sister        with  mets. died 33   Colon cancer Neg Hx    Social History   Socioeconomic History   Marital status: Widowed    Spouse name: Not on file   Number of children: Not on file   Years of education: Not on file   Highest education level: Not on file  Occupational History   Occupation: [employer]or   Tobacco Use   Smoking status: Former    Current packs/day: 0.00    Average packs/day: 0.3 packs/day for 20.0 years (5.0 ttl pk-yrs)    Types: Cigarettes    Start date: 03/23/1972    Quit date: 03/23/1992    Years since quitting: 31.3    Passive exposure: Past   Smokeless tobacco: Never   Tobacco comments:    Former smoker 09/16/22  Vaping Use   Vaping status: Never Used  Substance and Sexual Activity   Alcohol use: Yes    Alcohol/week: 3.0 standard drinks of alcohol    Types: 3 Glasses of wine per week   Drug use: No   Sexual activity: Not Currently  Other Topics Concern   Not on file  Social History Narrative   Widowed (lives alone at [place])- wife died at 45 (cancer at 56). Daughter 14, son 63 in 2023. 5 grandkids. 5 greatgrandkids.    -when patient were 37- started caring for 3 of his grandkids when daughter was not in a good place in the airforce.       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       Hobbies: golf, enjoys football and basketball at [employer] Strain: Low Risk  (07/06/2023)   Overall Financial Resource Strain (CARDIA)    Difficulty of Paying Living Expenses: Not hard at all  Food Insecurity: No Food Insecurity (07/06/2023)   Hunger Vital Sign    Worried About Running Out of Food in the Last Year: Never true    Ran Out of Food in the Last Year: Never true  Transportation Needs: No Transportation Needs (07/06/2023)   PRAPARE - Administrator, Civil Service (Medical): No    Lack of Transportation (Non-Medical): No  Physical Activity: Sufficiently Active (07/06/2023)   Exercise Vital Sign     Days of Exercise per Week: 7 days    Minutes of Exercise per Session: 40 min  Stress: No Stress Concern Present (07/06/2023)   [employer] of Occupational Health - Occupational Stress  Questionnaire    Feeling of Stress : Not at all  Social Connections: Moderately Isolated (07/06/2023)   Social Connection and Isolation Panel [NHANES]    Frequency of Communication with Friends and Family: Twice a week    Frequency of Social Gatherings with Friends and Family: More than three times a week    Attends Religious Services: More than 4 times per year    Active Member of [employer] or Organizations: No    Attends Banker Meetings: Never    Marital Status: Widowed    Tobacco Counseling Counseling given: Not Answered Tobacco comments: Former smoker 09/16/22   Clinical Intake:  Pre-visit preparation completed: Yes  Pain : No/denies pain     BMI - recorded: 25.08 Nutritional Status: BMI 25 -29 Overweight Nutritional Risks: None Diabetes: Yes CBG done?: No Did pt. bring in CBG monitor from home?: No  How often do you need to have someone help you when you read instructions, pamphlets, or other written materials from your doctor or pharmacy?: 1 - Never  Interpreter Needed?: No  Information entered by :: Lanier Ensign, LPN   Activities of Daily Living    07/06/2023   10:33 AM 08/20/2022   11:24 PM  In your present state of health, do you have any difficulty performing the following activities:  Hearing? 0 0  Vision? 0 0  Difficulty concentrating or making decisions? 0 0  Walking or climbing stairs? 1 0  Comment SOB   Dressing or bathing? 0 1  Doing errands, shopping? 0 0  Preparing Food and eating ? Y   Comment meals prepared for him   Using the Toilet? N   In the past six months, have you accidently leaked urine? N   Comment frequency   Do you have problems with loss of bowel control? N   Managing your Medications? N   Managing your Finances? N   Housekeeping  or managing your Housekeeping? N     Patient Care Team: Shelva Majestic, MD as PCP - General (Family Medicine) Regan Lemming, MD as PCP - Electrophysiology (Cardiology) Daleen Squibb, Jesse Sans, MD (Inactive) (Cardiology)  Indicate any recent Medical Services you may have received from other than [facility] providers in the past year (date may be approximate).     Assessment:   This is a routine wellness examination for Fowler.  Hearing/Vision screen Hearing Screening - Comments:: Pt denies any hearing issues  Vision Screening - Comments:: Pt follows up with Dr Dione Booze for annual eye exams  Dietary issues and exercise activities discussed:     Goals Addressed             This Visit's Progress    Patient Stated       Lose some inches off waist        Depression Screen    07/06/2023   10:40 AM 05/05/2022   10:16 AM  PHQ 2/9 Scores  PHQ - 2 Score 0 0    Fall Risk    07/06/2023   10:43 AM 02/19/2022    3:52 PM  Fall Risk   Falls in the past year? 1 0  Number falls in past yr: 1 0  Injury with Fall? 1 0  Comment nose   Risk for fall due to : Impaired vision;Impaired balance/gait;History of fall(s) Impaired balance/gait  Follow up Falls prevention discussed Falls evaluation completed    MEDICARE RISK AT HOME:  Medicare Risk at Home - 07/06/23 1044  Any stairs in or around the home? No    If so, are there any without handrails? No    Home free of loose throw rugs in walkways, pet beds, electrical cords, etc? Yes    Adequate lighting in your home to reduce risk of falls? Yes    Life alert? No    Use of a cane, walker or w/c? No    Grab bars in the bathroom? Yes    Shower chair or bench in shower? Yes    Elevated toilet seat or a handicapped toilet? Yes             TIMED UP AND GO:  Was the test performed? Yes  Length of time to ambulate 10 feet: 20 sec Gait slow and steady without use of assistive device    Cognitive Function:        07/06/2023   10:45  AM  6CIT Screen  What Year? 0 points  What month? 0 points  What time? 0 points  Count back from 20 0 points  Months in reverse 0 points  Repeat phrase 0 points  Total Score 0 points    Immunizations Immunization History  Administered Date(s) Administered   Influenza Split 10/24/2015   Influenza, High Dose Seasonal PF 10/14/2018   PFIZER(Purple Top)SARS-COV-2 Vaccination 08/20/2020, 09/10/2020      Flu Vaccine status: Declined, Education has been provided regarding the importance of this vaccine but patient still declined. Advised may receive this vaccine at local pharmacy or Health Dept. Aware to provide a copy of the vaccination record if obtained from local pharmacy or Health Dept. Verbalized acceptance and understanding.  Pneumococcal vaccine status: Due, Education has been provided regarding the importance of this vaccine. Advised may receive this vaccine at local pharmacy or Health Dept. Aware to provide a copy of the vaccination record if obtained from local pharmacy or Health Dept. Verbalized acceptance and understanding.  Covid-19 vaccine status: Completed vaccines  Qualifies for Shingles Vaccine? Yes   Zostavax completed No   Shingrix Completed?: No.    Education has been provided regarding the importance of this vaccine. Patient has been advised to call insurance company to determine out of pocket expense if they have not yet received this vaccine. Advised may also receive vaccine at local pharmacy or Health Dept. Verbalized acceptance and understanding.  Screening Tests Health Maintenance  Topic Date Due   OPHTHALMOLOGY EXAM  Never done   Zoster Vaccines- Shingrix (1 of 2) Never done   Pneumonia Vaccine 5+ Years old (1 of 1 - PCV) Never done   COVID-19 Vaccine (3 - Pfizer risk series) 10/08/2020   HEMOGLOBIN A1C  07/09/2023   INFLUENZA VACCINE  07/15/2023   FOOT EXAM  01/09/2024   Medicare Annual Wellness (AWV)  07/05/2024   HPV VACCINES  Aged Out    DTaP/Tdap/Td  Discontinued   Colonoscopy  Discontinued    Health Maintenance  Health Maintenance Due  Topic Date Due   OPHTHALMOLOGY EXAM  Never done   Zoster Vaccines- Shingrix (1 of 2) Never done   Pneumonia Vaccine 70+ Years old (1 of 1 - PCV) Never done   COVID-19 Vaccine (3 - Pfizer risk series) 10/08/2020    Colorectal cancer screening: No longer required.    Additional Screening:   Vision Screening: Recommended annual ophthalmology exams for early detection of glaucoma and other disorders of the eye. Is the patient up to date with their annual eye exam?  Yes  Who is the provider  or what is the name of the office in which the patient attends annual eye exams? Dr Dione Booze  If pt is not established with a provider, would they like to be referred to a provider to establish care? No .   Dental Screening: Recommended annual dental exams for proper oral hygiene    Community Resource Referral / Chronic Care Management: CRR required this visit?  No   CCM required this visit?  No    Plan:     I have personally reviewed and noted the following in the patient's chart:   Medical and social history Use of alcohol, tobacco or illicit drugs  Current medications and supplements including opioid prescriptions. Patient is not currently taking opioid prescriptions. Functional ability and status Nutritional status Physical activity Advanced directives List of other physicians Hospitalizations, surgeries, and ER visits in previous 12 months Vitals Screenings to include cognitive, depression, and falls Referrals and appointments  In addition, I have reviewed and discussed with patient certain preventive protocols, quality metrics, and best practice recommendations. A written personalized care plan for preventive services as well as general preventive health recommendations were provided to patient.     Marzella Schlein, LPN   03/27/2439   After Visit Summary: given   Nurse  Notes: none

## 2023-07-08 ENCOUNTER — Ambulatory Visit: Payer: Medicare Other | Attending: Cardiovascular Disease | Admitting: *Deleted

## 2023-07-08 DIAGNOSIS — Z5181 Encounter for therapeutic drug level monitoring: Secondary | ICD-10-CM | POA: Diagnosis not present

## 2023-07-08 LAB — POCT INR: INR: 2.5 (ref 2.0–3.0)

## 2023-07-08 NOTE — Patient Instructions (Signed)
Description   Continue taking Warfarin 3 tablets daily except 2 tablets on Sunday.  Recheck INR in 4 weeks.  Stay consistent with greens each week. Coumadin Clinic [phone].

## 2023-07-20 ENCOUNTER — Other Ambulatory Visit (HOSPITAL_COMMUNITY): Payer: Self-pay | Admitting: Cardiology

## 2023-07-20 DIAGNOSIS — I4891 Unspecified atrial fibrillation: Secondary | ICD-10-CM

## 2023-07-22 ENCOUNTER — Other Ambulatory Visit: Payer: Self-pay | Admitting: Family Medicine

## 2023-07-27 DIAGNOSIS — H401122 Primary open-angle glaucoma, left eye, moderate stage: Secondary | ICD-10-CM | POA: Diagnosis not present

## 2023-07-27 DIAGNOSIS — H35373 Puckering of macula, bilateral: Secondary | ICD-10-CM | POA: Diagnosis not present

## 2023-07-27 DIAGNOSIS — Z961 Presence of intraocular lens: Secondary | ICD-10-CM | POA: Diagnosis not present

## 2023-08-05 ENCOUNTER — Ambulatory Visit: Payer: Medicare Other

## 2023-08-11 ENCOUNTER — Ambulatory Visit: Payer: Medicare Other | Attending: Cardiology

## 2023-08-11 DIAGNOSIS — Z5181 Encounter for therapeutic drug level monitoring: Secondary | ICD-10-CM

## 2023-08-11 LAB — POCT INR: INR: 4.1 — AB (ref 2.0–3.0)

## 2023-08-11 NOTE — Patient Instructions (Signed)
Description   HOLD today's dose and then continue taking Warfarin 3 tablets daily except 2 tablets on Sunday.  Recheck INR in 2 weeks.  Stay consistent with greens each week. Coumadin Clinic [phone].

## 2023-08-13 ENCOUNTER — Other Ambulatory Visit (HOSPITAL_COMMUNITY): Payer: Self-pay | Admitting: Physician Assistant

## 2023-08-24 ENCOUNTER — Ambulatory Visit: Payer: Medicare Other

## 2023-08-24 ENCOUNTER — Telehealth: Payer: Self-pay | Admitting: *Deleted

## 2023-08-24 NOTE — Telephone Encounter (Signed)
Called pt since he missed his appt today; there was no answer so left a message for him to call back and reschedule.

## 2023-08-25 DIAGNOSIS — N400 Enlarged prostate without lower urinary tract symptoms: Secondary | ICD-10-CM | POA: Diagnosis not present

## 2023-08-25 DIAGNOSIS — R310 Gross hematuria: Secondary | ICD-10-CM | POA: Diagnosis not present

## 2023-08-26 ENCOUNTER — Ambulatory Visit: Payer: Medicare Other | Attending: Internal Medicine | Admitting: *Deleted

## 2023-08-26 DIAGNOSIS — Z5181 Encounter for therapeutic drug level monitoring: Secondary | ICD-10-CM | POA: Insufficient documentation

## 2023-08-26 LAB — POCT INR: INR: 2.7 (ref 2.0–3.0)

## 2023-08-26 NOTE — Patient Instructions (Signed)
Description   Continue taking Warfarin 3 tablets daily except 2 tablets on Sundays. Recheck INR in 3 weeks. Stay consistent with greens each week. Coumadin Clinic [phone].

## 2023-09-06 ENCOUNTER — Telehealth (HOSPITAL_COMMUNITY): Payer: Self-pay | Admitting: Cardiology

## 2023-09-06 NOTE — Telephone Encounter (Signed)
Grand daughter Marcelle Smiling left VM on triage line reporting-pt is COVID +, has upcoming appt and would like to know how to proceed for upcoming appt and side effects   Returned call at [phone] No answer unable to leave message   [facility] for pt

## 2023-09-07 ENCOUNTER — Ambulatory Visit: Payer: Medicare Other | Admitting: Family Medicine

## 2023-09-10 ENCOUNTER — Encounter (HOSPITAL_COMMUNITY): Payer: Medicare Other | Admitting: Cardiology

## 2023-09-16 ENCOUNTER — Ambulatory Visit: Payer: Medicare Other | Attending: Internal Medicine

## 2023-09-23 ENCOUNTER — Other Ambulatory Visit (HOSPITAL_COMMUNITY): Payer: Self-pay | Admitting: Cardiology

## 2023-09-23 DIAGNOSIS — I4891 Unspecified atrial fibrillation: Secondary | ICD-10-CM

## 2023-09-30 ENCOUNTER — Telehealth (HOSPITAL_COMMUNITY): Payer: Self-pay

## 2023-09-30 NOTE — Telephone Encounter (Signed)
Referral faxed to [facility] on 09/30/23 at [phone]

## 2023-10-22 DIAGNOSIS — R2681 Unsteadiness on feet: Secondary | ICD-10-CM | POA: Diagnosis not present

## 2023-10-22 DIAGNOSIS — M6259 Muscle wasting and atrophy, not elsewhere classified, multiple sites: Secondary | ICD-10-CM | POA: Diagnosis not present

## 2023-10-28 ENCOUNTER — Ambulatory Visit: Payer: Medicare Other | Admitting: Cardiology

## 2023-10-28 DIAGNOSIS — R2681 Unsteadiness on feet: Secondary | ICD-10-CM | POA: Diagnosis not present

## 2023-10-28 DIAGNOSIS — M6259 Muscle wasting and atrophy, not elsewhere classified, multiple sites: Secondary | ICD-10-CM | POA: Diagnosis not present

## 2023-11-01 ENCOUNTER — Ambulatory Visit: Payer: Medicare Other | Attending: Cardiology | Admitting: Student

## 2023-11-01 ENCOUNTER — Encounter: Payer: Self-pay | Admitting: Student

## 2023-11-01 VITALS — BP 98/70 | HR 100 | Ht 70.0 in | Wt 172.0 lb

## 2023-11-01 DIAGNOSIS — I4821 Permanent atrial fibrillation: Secondary | ICD-10-CM | POA: Diagnosis not present

## 2023-11-01 DIAGNOSIS — Z952 Presence of prosthetic heart valve: Secondary | ICD-10-CM | POA: Diagnosis not present

## 2023-11-01 DIAGNOSIS — I2510 Atherosclerotic heart disease of native coronary artery without angina pectoris: Secondary | ICD-10-CM

## 2023-11-01 DIAGNOSIS — I5042 Chronic combined systolic (congestive) and diastolic (congestive) heart failure: Secondary | ICD-10-CM | POA: Diagnosis not present

## 2023-11-01 NOTE — Patient Instructions (Signed)
Medication Instructions:  Take torsemide 60 mg twice daily for 2 days then resume usual dose *If you need a refill on your cardiac medications before your next appointment, please call your pharmacy*  Lab Work: BNP, BMP-TODAY If you have labs (blood work) drawn today and your tests are completely normal, you will receive your results only by: MyChart Message (if you have MyChart) OR A paper copy in the mail If you have any lab test that is abnormal or we need to change your treatment, we will call you to review the results.  Follow-Up: At [facility], you and your health needs are our priority.  As part of our continuing mission to provide you with exceptional heart care, we have created designated Provider Care Teams.  These Care Teams include your primary Cardiologist (physician) and Advanced Practice Providers (APPs -  Physician Assistants and Nurse Practitioners) who all work together to provide you with the care you need, when you need it.  We recommend signing up for the patient portal called "MyChart".  Sign up information is provided on this After Visit Summary.  MyChart is used to connect with patients for Virtual Visits (Telemedicine).  Patients are able to view lab/test results, encounter notes, upcoming appointments, etc.  Non-urgent messages can be sent to your provider as well.   To learn more about what you can do with MyChart, go to [email or web address].    Your next appointment:   3 month(s)  Provider:   [facility]" Lanna Poche, PA-C   Schedule next available appointment with the CHF clinic.

## 2023-11-01 NOTE — Progress Notes (Signed)
  Electrophysiology Office Note:   Date:  11/01/2023  ID:  Nathaniel Craft., DOB 04/17/1932, MRN [id number]  Primary Cardiologist: None Electrophysiologist: Will Jorja Loa, MD      History of Present Illness:   Nathaniel Casaus. is a 89 y.o. male with h/o CAD s/p CABG and redo, AAA s/p endo repair 2015, PAD, carotid stenosis, AVR, and permanent AF seen today for routine electrophysiology followup.   Since last being seen in our clinic the patient reports doing OK but having SOB with moderate activity. SOB with walking ~15 yards. No orthopnea. Denies SOB with changing clothes or bathing. + Peripheral edema but isn't significantly bothered by it. Drinking "lots of lemonade". A gallon of simply lemonade lasts him about 2 days. Drinking water as well. Using salt several times a day, especially with eggs.  Feels like UOP on torsemide is good.   Review of systems complete and found to be negative unless listed in HPI.   EP Information / Studies Reviewed:    EKG is ordered today. Personal review as below.  EKG Interpretation Date/Time:  Monday November 01 2023 11:51:11 EST Ventricular Rate:  100 PR Interval:    QRS Duration:  150 QT Interval:  392 QTC Calculation: 505 R Axis:   9  Text Interpretation: Atrial fibrillation Left bundle branch block When compared with ECG of 12-May-2023 08:34, Questionable change in QRS axis T wave inversion no longer evident in Inferior leads Confirmed by Maxine Glenn [phone]) on 11/01/2023 12:04:33 PM    Echo 04/29/2023 LVEF 30-35% (Down from 45%), Mod/Severe MR  Physical Exam:   VS:  BP 98/70   Pulse 100   Ht 5\' 10"  (1.778 m)   Wt 172 lb (78 kg)   BMI 24.68 kg/m    Wt Readings from Last 3 Encounters:  11/01/23 172 lb (78 kg)  07/06/23 174 lb (78.9 kg)  07/06/23 174 lb 12.8 oz (79.3 kg)     GEN: Well nourished, well developed in no acute distress NECK: + JVD; No carotid bruits CARDIAC: Irregularly irregular rate and rhythm, no murmurs,  rubs, gallops RESPIRATORY:  Crackles RLL.  ABDOMEN: Distended, tight.  EXTREMITIES:  Dependent edema in thighs, 2+. 1-2+ edema in ankles.   ASSESSMENT AND PLAN:    Permanent AF EKG today shows AF Failed tikosyn with AF Failed amiodarone due to CNS effects.  Rates overall have been controlled by chart, mostly 80-90s. 100 today but he is also volume overloaded. Continue Coumadin for CHA2DS2/VASc of at least 6.  Could consider monitor if more concerned about his rates  Chronic systolic CHF EF 30-35% 04/29/2023 with Mod/Sev MR QRS is 148 qith rS in V1 and lead 1. It is not clear that LBBB/CRT would help with his symptoms, and if the concern is loss of A-V synchrony has worsened his CHF, AV nodal ablation is unlikely to help this.  Volume overloaded on exam.  Drinking lots of "water and lemonade" each day. Also salting his foods, sometimes highly.  Feels like UOP is a lot on torsemide.  Take torsemide 60 mg BID x 2 days, then go back to 60 mg q am and 40 q pm.    CAD s/p CABG and repeat CABG Denies chest pain   Mod/Sev MR Not likely to be surgical candidate.    Follow up with EP APP in 3 months. Missed CHF clinic appointment in September and will have rescheduled.   Signed, Graciella Freer, PA-C

## 2023-11-02 DIAGNOSIS — M6259 Muscle wasting and atrophy, not elsewhere classified, multiple sites: Secondary | ICD-10-CM | POA: Diagnosis not present

## 2023-11-02 DIAGNOSIS — R2681 Unsteadiness on feet: Secondary | ICD-10-CM | POA: Diagnosis not present

## 2023-11-02 LAB — BASIC METABOLIC PANEL
BUN/Creatinine Ratio: 16 (ref 10–24)
BUN: 21 mg/dL (ref 8–27)
CO2: 24 mmol/L (ref 20–29)
Calcium: 9.2 mg/dL (ref 8.6–10.2)
Chloride: 101 mmol/L (ref 96–106)
Creatinine, Ser: 1.34 mg/dL — ABNORMAL HIGH (ref 0.76–1.27)
Glucose: 122 mg/dL — ABNORMAL HIGH (ref 70–99)
Potassium: 4 mmol/L (ref 3.5–5.2)
Sodium: 142 mmol/L (ref 134–144)
eGFR: 51 mL/min/{1.73_m2} — ABNORMAL LOW (ref >59)

## 2023-11-02 LAB — PRO B NATRIURETIC PEPTIDE: NT-Pro BNP: 5110 pg/mL — ABNORMAL HIGH (ref 0–486)

## 2023-11-05 DIAGNOSIS — M6259 Muscle wasting and atrophy, not elsewhere classified, multiple sites: Secondary | ICD-10-CM | POA: Diagnosis not present

## 2023-11-05 DIAGNOSIS — R2681 Unsteadiness on feet: Secondary | ICD-10-CM | POA: Diagnosis not present

## 2023-11-09 ENCOUNTER — Other Ambulatory Visit (HOSPITAL_COMMUNITY): Payer: Self-pay | Admitting: Cardiology

## 2023-11-09 DIAGNOSIS — R2681 Unsteadiness on feet: Secondary | ICD-10-CM | POA: Diagnosis not present

## 2023-11-09 DIAGNOSIS — M6259 Muscle wasting and atrophy, not elsewhere classified, multiple sites: Secondary | ICD-10-CM | POA: Diagnosis not present

## 2023-11-10 ENCOUNTER — Other Ambulatory Visit (HOSPITAL_COMMUNITY): Payer: Self-pay

## 2023-11-10 DIAGNOSIS — R2681 Unsteadiness on feet: Secondary | ICD-10-CM | POA: Diagnosis not present

## 2023-11-10 DIAGNOSIS — I5042 Chronic combined systolic (congestive) and diastolic (congestive) heart failure: Secondary | ICD-10-CM

## 2023-11-10 DIAGNOSIS — M6259 Muscle wasting and atrophy, not elsewhere classified, multiple sites: Secondary | ICD-10-CM | POA: Diagnosis not present

## 2023-11-10 MED ORDER — ATORVASTATIN CALCIUM 20 MG PO TABS: 20.0000 mg | ORAL_TABLET | Freq: Every day | ORAL | 5 refills | Status: DC

## 2023-11-17 DIAGNOSIS — R2681 Unsteadiness on feet: Secondary | ICD-10-CM | POA: Diagnosis not present

## 2023-11-17 DIAGNOSIS — M6259 Muscle wasting and atrophy, not elsewhere classified, multiple sites: Secondary | ICD-10-CM | POA: Diagnosis not present

## 2023-11-18 DIAGNOSIS — R2681 Unsteadiness on feet: Secondary | ICD-10-CM | POA: Diagnosis not present

## 2023-11-18 DIAGNOSIS — M6259 Muscle wasting and atrophy, not elsewhere classified, multiple sites: Secondary | ICD-10-CM | POA: Diagnosis not present

## 2023-11-23 ENCOUNTER — Telehealth: Payer: Self-pay | Admitting: *Deleted

## 2023-11-23 NOTE — Telephone Encounter (Signed)
CALLED PATIENT'S SON (JAY) TO ASK ABOUT RESCHEDULING MISSED SCAN, LVM FOR A RETURN CALL

## 2023-11-24 DIAGNOSIS — M6259 Muscle wasting and atrophy, not elsewhere classified, multiple sites: Secondary | ICD-10-CM | POA: Diagnosis not present

## 2023-11-24 DIAGNOSIS — R2681 Unsteadiness on feet: Secondary | ICD-10-CM | POA: Diagnosis not present

## 2023-12-01 ENCOUNTER — Other Ambulatory Visit (HOSPITAL_COMMUNITY): Payer: Self-pay | Admitting: Cardiology

## 2023-12-01 ENCOUNTER — Encounter (HOSPITAL_COMMUNITY): Payer: Medicare Other

## 2023-12-01 DIAGNOSIS — I4891 Unspecified atrial fibrillation: Secondary | ICD-10-CM

## 2023-12-01 NOTE — Telephone Encounter (Signed)
Prescription refill request for Eliquis received. Indication: Afib  Last office visit: 11/01/23 Lanna Poche)  Scr: 1.34 (11/01/23)  Age: 89 Weight: 78kg  Appropriate dose. Refill sent.

## 2024-01-14 ENCOUNTER — Ambulatory Visit: Payer: Medicare Other

## 2024-01-14 ENCOUNTER — Other Ambulatory Visit (HOSPITAL_COMMUNITY): Payer: Self-pay | Admitting: Cardiology

## 2024-01-14 DIAGNOSIS — I4891 Unspecified atrial fibrillation: Secondary | ICD-10-CM

## 2024-01-17 MED ORDER — WARFARIN SODIUM 3 MG PO TABS
ORAL_TABLET | ORAL | 0 refills | Status: DC
Start: 2024-01-17 — End: 2024-02-07

## 2024-01-17 NOTE — Telephone Encounter (Signed)
Refill request for warfarin:  Last INR was 2.7 on 08/26/23 INR Past Due LOV was 11/01/23  Refill approved x 1 with message to schedulers to make appt.

## 2024-01-18 ENCOUNTER — Ambulatory Visit: Payer: Medicare Other | Attending: Cardiology | Admitting: *Deleted

## 2024-01-18 DIAGNOSIS — Z953 Presence of xenogenic heart valve: Secondary | ICD-10-CM | POA: Insufficient documentation

## 2024-01-18 DIAGNOSIS — Z5181 Encounter for therapeutic drug level monitoring: Secondary | ICD-10-CM | POA: Diagnosis not present

## 2024-01-18 LAB — POCT INR: INR: 1.5 — AB (ref 2.0–3.0)

## 2024-01-18 NOTE — Patient Instructions (Addendum)
Description   Today take 4 tablets of warfarin then continue taking Warfarin 3 tablets daily except 2 tablets on Sundays. Recheck INR in 2 weeks. Stay consistent with greens each week. [facility] [phone].

## 2024-01-31 NOTE — Progress Notes (Deleted)
  Electrophysiology Office Note:   Date:  01/31/2024  ID:  Nathaniel Craft., DOB 05-Sep-1932, MRN [id number]  Primary Cardiologist: None Electrophysiologist: Will Jorja Loa, MD  {Click to update primary MD,subspecialty MD or APP then REFRESH:1}    History of Present Illness:   Nathaniel Brinkmeier. is a 89 y.o. male with h/o CAD s/p CABG and redo, AAA s/p endo repair 2015, PAD, carotid stenosis, AVR, and permanent AF seen today for routine electrophysiology followup.   Since last being seen in our clinic the patient reports doing ***.  he denies chest pain, palpitations, dyspnea, PND, orthopnea, nausea, vomiting, dizziness, syncope, edema, weight gain, or early satiety.   Review of systems complete and found to be negative unless listed in HPI.   EP Information / Studies Reviewed:    {EKGtoday:28818}       Arrhythmia/Device History HF MD--Dr Birdie Riddle 04/29/2023 LVEF 30-35% (Down from 45%), Mod/Severe MR  Physical Exam:   VS:  There were no vitals taken for this visit.   Wt Readings from Last 3 Encounters:  11/01/23 172 lb (78 kg)  07/06/23 174 lb (78.9 kg)  07/06/23 174 lb 12.8 oz (79.3 kg)     GEN: No acute distress NECK: No JVD; No carotid bruits CARDIAC: {EPRHYTHM:28826}, no murmurs, rubs, gallops RESPIRATORY:  Clear to auscultation without rales, wheezing or rhonchi  ABDOMEN: Soft, non-tender, non-distended EXTREMITIES:  {EDEMA LEVEL:28147::"No"} edema; No deformity   ASSESSMENT AND PLAN:    Permanent AF Failed tikosyn with AF Failed amiodarone due to CNS effects Continue coumadin for CHA2DS2VASc of at least 6  ***  Chronic systolic CHF EF 30-35% 04/29/2023 with Mod/Sev MR  CAD s/p CABG and repeat CABG Denies chest pain  Mod/Sev MR Not likely to be surgical candidate  {Click here to Review PMH, Prob List, Meds, Allergies, SHx, FHx  :1}   Follow up with {XBMWU:13244} {EPFOLLOW WN:02725}  Signed, Graciella Freer, PA-C

## 2024-02-01 ENCOUNTER — Ambulatory Visit: Payer: Medicare Other | Admitting: Student

## 2024-02-01 DIAGNOSIS — I2510 Atherosclerotic heart disease of native coronary artery without angina pectoris: Secondary | ICD-10-CM

## 2024-02-01 DIAGNOSIS — Z953 Presence of xenogenic heart valve: Secondary | ICD-10-CM

## 2024-02-01 DIAGNOSIS — I5042 Chronic combined systolic (congestive) and diastolic (congestive) heart failure: Secondary | ICD-10-CM

## 2024-02-01 DIAGNOSIS — I4821 Permanent atrial fibrillation: Secondary | ICD-10-CM

## 2024-02-02 ENCOUNTER — Ambulatory Visit: Payer: Medicare Other

## 2024-02-05 ENCOUNTER — Other Ambulatory Visit (HOSPITAL_COMMUNITY): Payer: Self-pay | Admitting: Cardiology

## 2024-02-05 ENCOUNTER — Other Ambulatory Visit: Payer: Self-pay | Admitting: Family Medicine

## 2024-02-05 DIAGNOSIS — I4891 Unspecified atrial fibrillation: Secondary | ICD-10-CM

## 2024-02-08 ENCOUNTER — Other Ambulatory Visit (HOSPITAL_COMMUNITY): Payer: Self-pay | Admitting: Cardiology

## 2024-02-10 ENCOUNTER — Ambulatory Visit: Payer: Medicare Other | Attending: Cardiology

## 2024-02-10 DIAGNOSIS — I4891 Unspecified atrial fibrillation: Secondary | ICD-10-CM

## 2024-02-10 DIAGNOSIS — Z5181 Encounter for therapeutic drug level monitoring: Secondary | ICD-10-CM | POA: Diagnosis not present

## 2024-02-10 LAB — POCT INR: INR: 1.7 — AB (ref 2.0–3.0)

## 2024-02-10 NOTE — Patient Instructions (Signed)
 Description   Start taking Warfarin 3 tablets daily. Recheck INR in 2 weeks, pt requested 3 week follow-up aware of risks. Stay consistent with greens each week. [facility] [phone].

## 2024-02-24 NOTE — Progress Notes (Deleted)
  Electrophysiology Office Note:   Date:  02/24/2024  ID:  Mariel Craft., DOB March 10, 1932, MRN [id number]  Primary Cardiologist: None Electrophysiologist: Will Jorja Loa, MD  {Click to update primary MD,subspecialty MD or APP then REFRESH:1}    History of Present Illness:   Nathaniel [name]. is a 89 y.o. male with h/o CAD s/p CABG and redo, AAA s/p endo repair 2015, PAD, carotid stenosis, AVR, and permanent AF seen today for routine electrophysiology followup.   Since last being seen in our clinic the patient reports doing ***.  he denies chest pain, palpitations, dyspnea, PND, orthopnea, nausea, vomiting, dizziness, syncope, edema, weight gain, or early satiety.   Review of systems complete and found to be negative unless listed in HPI.   EP Information / Studies Reviewed:    {EKGtoday:28818}       Arrhythmia/Device History HF MD--Dr Birdie Riddle 04/29/2023 LVEF 30-35% (Down from 45%), Mod/Severe MR  Physical Exam:   VS:  There were no vitals taken for this visit.   Wt Readings from Last 3 Encounters:  11/01/23 172 lb (78 kg)  07/06/23 174 lb (78.9 kg)  07/06/23 174 lb 12.8 oz (79.3 kg)     GEN: No acute distress NECK: No JVD; No carotid bruits CARDIAC: {EPRHYTHM:28826}, no murmurs, rubs, gallops RESPIRATORY:  Clear to auscultation without rales, wheezing or rhonchi  ABDOMEN: Soft, non-tender, non-distended EXTREMITIES:  {EDEMA LEVEL:28147::"No"} edema; No deformity   ASSESSMENT AND PLAN:    Permanent AF Failed tikosyn with AF Failed amiodarone due to CNS effects Continue coumadin for CHA2DS2VASc of at least 6  ***  Chronic systolic CHF EF 30-35% 04/29/2023 with Mod/Sev MR  CAD s/p CABG and repeat CABG Denies chest pain  Mod/Sev MR Not likely to be surgical candidate  {Click here to Review PMH, Prob List, Meds, Allergies, SHx, FHx  :1}   Follow up with {WGNFA:21308} {EPFOLLOW MV:78469}  Signed, Graciella Freer, PA-C

## 2024-02-25 ENCOUNTER — Ambulatory Visit: Payer: Medicare Other | Attending: Student | Admitting: Student

## 2024-02-25 DIAGNOSIS — I2510 Atherosclerotic heart disease of native coronary artery without angina pectoris: Secondary | ICD-10-CM

## 2024-02-25 DIAGNOSIS — Z953 Presence of xenogenic heart valve: Secondary | ICD-10-CM

## 2024-02-25 DIAGNOSIS — I4891 Unspecified atrial fibrillation: Secondary | ICD-10-CM

## 2024-02-25 DIAGNOSIS — I5042 Chronic combined systolic (congestive) and diastolic (congestive) heart failure: Secondary | ICD-10-CM

## 2024-02-28 ENCOUNTER — Encounter: Payer: Self-pay | Admitting: Student

## 2024-03-02 ENCOUNTER — Ambulatory Visit: Payer: Medicare Other | Attending: Cardiovascular Disease

## 2024-03-02 DIAGNOSIS — I4891 Unspecified atrial fibrillation: Secondary | ICD-10-CM | POA: Diagnosis not present

## 2024-03-02 DIAGNOSIS — Z5181 Encounter for therapeutic drug level monitoring: Secondary | ICD-10-CM | POA: Insufficient documentation

## 2024-03-02 LAB — POCT INR: INR: 2.3 (ref 2.0–3.0)

## 2024-03-02 NOTE — Patient Instructions (Signed)
 Description   Continue on same dosage of Warfarin 3 tablets daily. Recheck INR in 4 weeks, pt requested follow-up in new building aware later follow-up than we would recommend. Pt aware of risks. Stay consistent with greens each week. Coumadin Clinic [phone].

## 2024-03-08 ENCOUNTER — Inpatient Hospital Stay (HOSPITAL_COMMUNITY): Admission: RE | Admit: 2024-03-08 | Source: Ambulatory Visit

## 2024-03-08 ENCOUNTER — Telehealth (HOSPITAL_COMMUNITY): Payer: Self-pay | Admitting: Cardiology

## 2024-03-08 ENCOUNTER — Telehealth (HOSPITAL_COMMUNITY): Payer: Self-pay

## 2024-03-08 NOTE — Telephone Encounter (Signed)
 Returned call to grand daughter -reports she went to see patient last night and patient was extremely lethargic,SOB,lips seemed blue  -grand daughter reports she gave him half a milky [address] and patient seemed much better -pt is still very SOB today   EMS is en route-to look over patient before appt Pt would like to be seen later in the week so 3/26 appt cancelled   No weights to reports No vitals Reports BLE edema Pt denies CP Reports increased fatigue ( unable to walk from chair to bed)     Please advise

## 2024-03-08 NOTE — Telephone Encounter (Signed)
 Patients grand daughter called with concerns of increased SOB Request add on appt  Add on 3/26 @ 230

## 2024-03-09 NOTE — Telephone Encounter (Signed)
 Returned call Patient reports he is feeling a little better today however would like an appt to discuss increase in SOB and "functional decline"  Add on 4/1 with Dr Shirlee Latch

## 2024-03-10 ENCOUNTER — Telehealth: Payer: Self-pay | Admitting: Cardiology

## 2024-03-10 NOTE — Telephone Encounter (Incomplete)
 Called to confirm/remind patient of their appointment at the [facility] on 03/14/24***.   Appointment:   [] Confirmed  [x] Left mess   [] No answer/No voice mail  [] Phone not in service  Patient reminded to bring all medications and/or complete list.  Confirmed patient has transportation. Gave directions, instructed to utilize valet parking.

## 2024-03-13 ENCOUNTER — Telehealth: Payer: Self-pay | Admitting: Cardiology

## 2024-03-13 NOTE — Telephone Encounter (Signed)
 Called to confirm/remind patient of their appointment at the [facility] on 03/03/24.   Appointment:   [] Confirmed  [x] Left mess   [] No answer/No voice mail  [] Phone not in service  Patient reminded to bring all medications and/or complete list.  Confirmed patient has transportation. Gave directions, instructed to utilize valet parking.

## 2024-03-14 ENCOUNTER — Other Ambulatory Visit
Admission: RE | Admit: 2024-03-14 | Discharge: 2024-03-14 | Disposition: A | Discharge status: Home / Self Care | Source: Ambulatory Visit | Attending: Cardiology | Admitting: Cardiology

## 2024-03-14 ENCOUNTER — Ambulatory Visit: Admitting: Cardiology

## 2024-03-14 ENCOUNTER — Telehealth: Payer: Self-pay | Admitting: Cardiology

## 2024-03-14 VITALS — BP 115/102 | HR 100 | Wt 182.2 lb

## 2024-03-14 DIAGNOSIS — I5042 Chronic combined systolic (congestive) and diastolic (congestive) heart failure: Secondary | ICD-10-CM | POA: Insufficient documentation

## 2024-03-14 DIAGNOSIS — I509 Heart failure, unspecified: Secondary | ICD-10-CM | POA: Diagnosis not present

## 2024-03-14 LAB — BASIC METABOLIC PANEL WITH GFR
Anion gap: 8 (ref 5–15)
BUN: 24 mg/dL — ABNORMAL HIGH (ref 8–23)
CO2: 28 mmol/L (ref 22–32)
Calcium: 9.1 mg/dL (ref 8.9–10.3)
Chloride: 99 mmol/L (ref 98–111)
Creatinine, Ser: 1.2 mg/dL (ref 0.61–1.24)
GFR, Estimated: 58 mL/min — ABNORMAL LOW (ref >60)
Glucose, Bld: 121 mg/dL — ABNORMAL HIGH (ref 70–99)
Potassium: 4.2 mmol/L (ref 3.5–5.1)
Sodium: 135 mmol/L (ref 135–145)

## 2024-03-14 LAB — CBC
HCT: 41.6 % (ref 39.0–52.0)
Hemoglobin: 13.5 g/dL (ref 13.0–17.0)
MCH: 29.4 pg (ref 26.0–34.0)
MCHC: 32.5 g/dL (ref 30.0–36.0)
MCV: 90.6 fL (ref 80.0–100.0)
Platelets: 151 10*3/uL (ref 150–400)
RBC: 4.59 MIL/uL (ref 4.22–5.81)
RDW: 14.8 % (ref 11.5–15.5)
WBC: 5.7 10*3/uL (ref 4.0–10.5)
nRBC: 0 % (ref 0.0–0.2)

## 2024-03-14 LAB — BRAIN NATRIURETIC PEPTIDE: B Natriuretic Peptide: 1091.5 pg/mL — ABNORMAL HIGH (ref 0.0–100.0)

## 2024-03-14 LAB — PROTIME-INR
INR: 1.8 — ABNORMAL HIGH (ref 0.8–1.2)
Prothrombin Time: 21.5 s — ABNORMAL HIGH (ref 11.4–15.2)

## 2024-03-14 MED ORDER — POTASSIUM CHLORIDE CRYS ER 20 MEQ PO TBCR: 40.0000 meq | EXTENDED_RELEASE_TABLET | Freq: Two times a day (BID) | ORAL | Status: DC

## 2024-03-14 MED ORDER — TORSEMIDE 20 MG PO TABS: ORAL_TABLET | ORAL | 5 refills | Status: DC

## 2024-03-14 MED ORDER — FUROSCIX 80 MG/10ML ~~LOC~~ CTKT: 80.0000 mg | CARTRIDGE | SUBCUTANEOUS | Status: DC

## 2024-03-14 NOTE — Telephone Encounter (Signed)
 Caller Merry Proud) wants to know how long patient should hold his Coumadin.  Patient is scheduled for right heart cath on 4/8.

## 2024-03-14 NOTE — Telephone Encounter (Signed)
 This is not a preop for our preop team.  This needs to be addressed by Dr. Alford Highland nurse.  I will send to HF triage

## 2024-03-14 NOTE — Patient Instructions (Signed)
 Medication Changes:  NO Torsemide Today or Wednesday STARTING THURSDAY START Torsemide 80mg  (4 tabs) every morning and 60mg  (3 tabs) every evening  INCREASE Potassium 40mg  (2 tabs) two times daily  Lab Work:  Go DOWN to LOWER LEVEL (LL) to have your blood work completed inside of [employer] office.   We will only call you if the results are abnormal or if the provider would like to make medication changes.   Testing/Procedures:   [facility] [address] [place] [address] Dept: [phone]  Nathaniel [name].  03/14/2024  You are scheduled for a Cardiac Catheterization on Tuesday, April 8 with Dr. [name].  1. Please arrive at the [facility] (Main Entrance A) at [facility]: [address] [place], [place] [address] at 5:30 AM (This time is 2 hour(s) before your procedure to ensure your preparation).   Free valet parking service is available. You will check in at ADMITTING. The support person will be asked to wait in the waiting room.  It is OK to have someone drop you off and come back when you are ready to be discharged.    Special note: Every effort is made to have your procedure done on time. Please understand that emergencies sometimes delay scheduled procedures.  2. Diet: Do not eat solid foods after midnight.  The patient may have clear liquids until 5am upon the day of the procedure.  3. Labs: You will need to have blood drawn today.  4. Medication instructions in preparation for your procedure:   Contrast Allergy: No   Do not take: Spironolactone or Torsemide the morning of your procedure.     On the morning of your procedure any morning medicines NOT listed above.  You may use sips of water.  5. Plan to go home the same day, you will only stay overnight if medically necessary. 6. Bring a current list of your medications and current insurance cards. 7.  You MUST have a responsible person to drive you home. 8. Someone MUST be with you the first 24 hours after you arrive home or your discharge will be delayed. 9. Please wear clothes that are easy to get on and off and wear slip-on shoes.  Thank you for allowing [place] to care for you!   -- [facility]  Follow-Up in: Please follow up with the [facility] in 3 weeks.  At the [facility], you and your health needs are our priority. We have a designated team specialized in the treatment of Heart Failure. This Care Team includes your primary Heart Failure Specialized Cardiologist (physician), Advanced Practice Providers (APPs- Physician Assistants and Nurse Practitioners), and Pharmacist who all work together to provide you with the care you need, when you need it.   You may see any of the following providers on your designated Care Team at your next follow up:  Dr. Arvilla Meres Dr. [name] Dr. Dorthula Nettles Dr. Theresia Bough Clarisa Kindred, FNP Enos Fling, RPH-CPP  Please be sure to bring in all your medications bottles to every appointment.   Need to Contact us:  If you have any questions or concerns before your next appointment please send [place] a message through [place] or call our office at [phone].    TO LEAVE A MESSAGE FOR THE NURSE SELECT OPTION 2, PLEASE LEAVE A MESSAGE INCLUDING: YOUR NAME DATE OF BIRTH CALL  BACK NUMBER REASON FOR CALL**this is important as we prioritize the call backs  YOU WILL RECEIVE A CALL BACK THE SAME DAY AS LONG AS YOU CALL BEFORE 4:00 PM

## 2024-03-14 NOTE — Progress Notes (Signed)
 ADVANCED HF CLINIC NOTE                                                                                                                                                                                                                                                                                                                                                                                                                                                                                                   Patient ID: Nathaniel Blitch., male   DOB: 05/30/1932, 89 y.o.   MRN: [id number] PCP: Shelva Majestic, MD Cardiology: Dr. Shirlee Latch  89 y.o. with history of extensive vascular disease including CAD s/p CABG and redo CABG, AAA s/p endovascular repair in 2015, PAD, and carotid stenosis as well as prior AVR.                             After his AAA repair, he was noted to be in atrial fibrillation.  He had had a prior episode of atrial fibrillation after his cardiac surgery in 2007.  Atrial fibrillation was rate-controlled.  He was started on warfarin and Toprol XL.  He developed exertional dyspnea and fatigue and was cardioverted back to SR 06/06/14.  Echo 06/15 EF worsened to 25-30% with stable bioprosthetic aortic valve, mildly dilated RV with normal systolic function.  LHC 07/15 showed stable anatomy.  The LIMA-LAD was patent, the sequential SVG-OM/ramus/D was patent only to ramus but this is the same as the prior cath, and the SVG-PDA was patent with a long 50-60% mid-graft stenosis.  No intervention.  Of note, at cath he was noted to be back in atrial fibrillation.  He was started on amiodarone and had DCCV again on 08/21/14.  This was successful.  He has been off amiodarone due to side effects (hallucinations).  Echo in 1/16 showed recovery of EF to 55-60%.    He was admitted in 1/19 with bronchitis/wheezing/dyspnea and was found to be in atrial fibrillation with RVR.  He tested positive for metapneumovirus.   Echo showed EF 45-50% with normal bioprosthetic aortic valve. He was diuresed for volume overload while in the hospital. He had DCCV to NSR.    Carotid dopplers in 1/19 showed 80-99% RICA stenosis, he had right CEA in 2/19 with no complications.   At appointment in early 6/20, Nathaniel [name] was noted to be back in atrial fibrillation with RVR and mild CHF.  He was started on Toprol XL and Lasix was increased.  He had TEE-guided DCCV to NSR in 6/20.  TEE showed EF 50%, mild diffuse hypokinesis, mildly decreased RV systolic function, normally functioning bioprosthetic aortic valve.   In 2021, he was admitted for Tikosyn initiation.  In 12/21, he went into atrial fibrillation again and was cardioverted back to NSR.   Echo in 1/22 showed EF 45-50%, diffuse hypokinesis, mildly decreased RV systolic function, moderate biatrial enlargement, bioprosthetic valve with mean gradient 8 mmHg.    In 2/22, he went into atrial fibrillation and had TEE-guided DCCV back to NSR.  TEE showed EF 45-50%.    In 3/22, he was back in the ER with atrial fibrillation triggered by [name] most likely.  He had a cardioversion back to NSR.   In 10/22, patient developed urinary retention and an E coli UTI.  In the setting of this, he had a fall at home and was unable to get up due to weakness for hours.  He developed rhabdomyolysis.  This resolved with minimal renal damage with IV fluid hydration and he was discharged to rehab.   Follow up 2/23 he was in atrial fibrillation, TEE/DCCV arranged however INR subtherapeutic. He cancelled rescheduled procedure due to a death in the family.  Follow up 3/23. Remained in AF. Volume overloaded. Lasix increased for 3 days. Underwent TEE guided DCCV on 03/19/22.  TEE showed EF 50-55%, mildly decreased RV function, normal bioprosthetic aortic valve, mild Nathaniel and mild mitral stenosis (mean gradient 3 mmHg).    Recently diagnosed with stage IA2 NSCLC RUL. Saw Radiation Oncology and Pulmonary  and decided on empiric treatment with SBRT.  He has completed radiation.   Admitted 9/23 with NSTEMI and AF with RVR. Underwent R/LHC showing normal filling pressures, known occlusion of native coronaries and SVG-RCA from original CABG. New occlusion of SVG-PDA from CABG#2. Long 99% stenosis in the SVG-ramus. Ramus is relatively small vessel. Patent LIMA-LAD with collaterals to RCA territory. Case was discussed with Dr. Lynnette Caffey, wouldn't benefit from intervention, medically managed. Patient declined re-challenge of amiodarone and was able to have successful TEE/DCCV to NSR.  TEE showed EF 45% with normal RV, mild-moderate Nathaniel, normal bioprosthetic aortic valve, no LA appendage thrombus. Discharged home, weight 184 lbs.  Follow up 9/23, back in atrial fibrillation with HR 116. Toprol increased to 25 bid and arranged for TEE/DCCV.  TEE/DCCV (10/23) showed EF 45%, diffuse HK, RV mildly decreased, moderate Nathaniel, Bioprosthetic AoV stable with mean gradient 6 mmHg, no LAA. Proceeded with successful DCCV to NSR.  However, later in 10/23 he was back in AF and has been in AF since that time.   Echo in 5/24 showed EF lower at 30-35%, moderate LV dilation, mild RV dysfunction, moderate-severe Nathaniel, bioprosthetic aortic valve appeared normal, IVC normal.   He returns for followup of CHF.  Weight up over 10 lbs. He is short of breath just walking around his house.  Worse x 2-3 weeks.  +Orthopnea, sleeps on several pillows.  No chest pain.  No lightheadedness.  No palpitations. He has not taken any meds yet today.   ECG: AF, QRS 142 IVCD (personally reviewed)  REDS 42%   Labs (1/19): K 4.9, creatinine 1.19 Labs (2/19): K 4.2, creatinine 0.89 Labs (3/19): LDL 86, HDL 45, TGs 245 Labs (5/19): LDL 110, HDL 39 Labs (6/19): K 4.1, creatinine 1.07 Labs (11/19): LDL 14, HDL 56 Labs (6/20): K 4.1, creatinine 1.0 Labs (7/20): K 4.3, creatinine 1.07 Labs (9/21): LDL 129, hgb 14.8, K 4.1, creatinine 1.02 Labs (12/21):  LDL 20, HDL 46, K 4.4, creatinine 7.84 Labs (1/22): LDL 45, HDL 49, K 4.3, creatinine 1.1, BNP 184 Labs (3/22): K 4.4, creatinine 1.10, LDL 15, HDL 49 Labs (6/22): K 4.3, creatinine 1.11, LDL 121 Labs (7/22): K 4.3, creatinine 1.23, hgb 14.5 Labs (11/22): K 4.5, creatinine 1.14 Labs (2/23): K 4.3, creatinine 1.16 Labs (7/23): K 4.4, creatinine 1.24 Labs (9/23): K 4.1, creatinine 1.19 Labs (10/23): K 4.2, creatinine 1.31 Labs (1/24): K 4.8, SCr 1.13, Hgb A1c 7.7 Labs (4/24): K 5, creatinine 1.56 => 1.33, BNP 726 Labs (5/24): K 4.7, SCr 1.29 BNP 895 Labs (5/24): K 4.9, SCr 2.14, BNP 398 Labs (6/24): K 4.6, SCr 1.66  Labs (7/24): LDL 92 Labs (11/24): K 4, creatinine 1.34, pro-BNP 5110  PMH: 1. PAD: Peripheral arterial dopplers in 2012 showed > 75% bilateral SFA stenosis. Peripheral arterial dopplers in 10/14 showed > 50% focal bilateral SFA stenoses.  2. AAA: [place] 4/15 with 4.4 cm AAA but concern for penetrating ulcers. CTA abdomen showed 4.4 cm AAA with penetrating ulcers and possible pseudoaneurysms. Now s/p endovascular repair of AAA in 5/15 with no complications.  3. Carotid stenosis: Carotid dopplers (4/15) with 60-79% bilateral ICA stenosis. Carotid dopplers (11/15) with 60-79% bilateral ICA stenosis.  Carotid dopplers 6/16 with 60-79% bilateral ICA stenosis. - Carotid dopplers (1/18): 60-79% RICA stenosis.  - Carotid dopplers (1/19): 80-99% RICA stenosis => Right CEA in 2/19.  - Carotid dopplers (11/19): 40-59% bilateral stenosis.  - Carotid dopplers (1/21): 1-39% BICA stenosis.  4. CAD: CABG 1989 with LIMA-LAD, SVG-D, seq SVG-ramus and OM, sequential SVG-PDA/PLV. SVG-PDA/PLV found to be occluded on cath prior to AVR, so patient had SVG-PDA with AVR in 2007. LHC (7/15) with LIMA-LAD patent with 40-50% stenosis in LAD after touchdown, sequential SVG-ramus/OM/diagonal with only the ramus branch still intact (known from prior cath), SVG-PDA from original surgery TO, SVG-PDA from redo surgery  with long 50-60% mid-graft stenosis.  No target for intervention.  - LHC (9/23): known occlusion of native coronaries and SVG-RCA from original CABG, new occlusion of SVG-PDA from CABG#2,  long 99% stenosis in the SVG-ramus (was sequential SVG-ramus/OM/D but OM and D branches noted to be lost on prior cath), ramus is relatively small vessel, patent LIMA-LAD with collaterals to RCA territory. Case reviewed with Dr. Lynnette Caffey, probably not much benefit to intervention on SVG-ramus. Small territory covered and complex disease in SVG. Medical management advised. 5. Severe aortic stenosis: Bioprosthetic AVR in 2007.  6. Atrial fibrillation: Paroxysmal. Initially noted after cardiac surgery, then again after AAA repair.  DCCV to NSR 06/06/14.  DCCV to NSR again 08/21/14.  - DCCV to NSR in 1/19.  - DCCV to NSR in 6/20. .  - DCCV to NSR 12/21, 2/22, 3/22, 4/23, 9/23, 10/23 - Now permanent, off Tikosyn.  7. Type II diabetes  8. Gout  9. GERD  10. Hyperlipidemia  11. HTN 12. GERD  13. Chronic systolic CHF: Echo (6/15) with EF worsened to 25-30% with grade II diastolic dysfunction, stable bioprosthetic aortic valve, mildly dilated RV with normal systolic function.  Echo (1/16) with EF 55-60%, basal inferior hypokinesis, mild LVH, bioprosthetic aortic valve with mean gradient 13 mmHg, mild Nathaniel, severe LAE.  - Echo (1/19): EF 45-50%, mild Nathaniel, normal bioprosthetic aortic valve.  - TEE (6/20): EF 50% with mild diffuse hypokinesis, mildly decreased RV systolic function with mild RV enlargement, bioprosthetic aortic valve looked ok, mild Nathaniel. - Echo (1/22):  EF 45-50%, diffuse hypokinesis, mildly decreased RV systolic function, moderate biatrial enlargement, bioprosthetic valve with mean gradient 8 mmHg.   - TEE (2/22): EF 45-50%, RV normal, moderate biatrial enlargement, bioprosthetic AoV with mean gradient 6 mmHg, mild Nathaniel.  - TEE (4/23): EF 50-55%, mildly decreased RV function, normal bioprosthetic aortic valve, mild  Nathaniel and mild mitral stenosis (mean gradient 3 mmHg) - TEE (9/23): EF 45%, RV normal, no LAA thrombus, no PFO/ASD, mild to moderate Nathaniel w/ moderate MAC, AoV without stenosis/regurgitation - TEE (10/23): EF 45%, RV mildly decreased, no LAA thrombus, no PFO/ASD, moderate Nathaniel, AoV without stenosis/regurgitation, mean gradient 6 mmHg. - Echo (5/24): EF 30-35%, moderate LV dilation, mild RV dysfunction, moderate-severe Nathaniel, bioprosthetic aortic valve appeared normal, IVC normal.  14. BPH  15. Nonsmall cell lung cancer: Suspected by imaging, treated in 2023 with radiation.  16. Fall with rhabdomyolysis (10/22)   SH: Widower, 2 children, raised his 3 grand-daughters, lives in [place], [place].  Occasional ETOH, no smoking.   FH: Father died from ruptured AAA.   ROS: All systems reviewed and negative except as per HPI.   Current Outpatient Medications  Medication Sig Dispense Refill   atorvastatin (LIPITOR) 20 MG tablet Take 1 tablet (20 mg total) by mouth daily. Alternating with 30mg  30 tablet 5   Coenzyme Q10 (CO Q10) 200 MG CAPS Take 200 mg by mouth daily.     dorzolamide-timolol (COSOPT) 22.3-6.8 MG/ML ophthalmic solution Place 1 drop into both eyes every evening.     ezetimibe (ZETIA) 10 MG tablet TAKE ONE TABLET BY MOUTH AT BEDTIME 90 tablet 3   Furosemide (FUROSCIX) 80 MG/10ML CTKT Inject 80 mg into the skin as directed.     latanoprost (XALATAN) 0.005 % ophthalmic solution Place 1 drop into both eyes at bedtime.     METAMUCIL FIBER PO Take 1 Dose by mouth 2 (two) times a week. As needed     metoprolol succinate (TOPROL-XL) 50 MG 24 hr tablet Take 1 tablet (50 mg total) by mouth in the morning and at bedtime. Take with or immediately following a meal. 60 tablet 3   Misc  Natural Products (GLUCOSAMINE CHONDROITIN TRIPLE) TABS Take 1 tablet by mouth daily.     ramipril (ALTACE) 2.5 MG capsule Take 1 capsule (2.5 mg total) by mouth daily. 30 capsule 3   REPATHA SURECLICK 140 MG/ML SOAJ INJECT 1  PEN INTO SKIN EVERY 14 DAYS 2 mL 11   sildenafil (VIAGRA) 25 MG tablet TAKE ONE TABLET ONCE A DAY AS NEEDED 10 tablet 4   spironolactone (ALDACTONE) 25 MG tablet TAKE ONE TABLET BY MOUTH EVERY DAY 30 tablet 5   tamsulosin (FLOMAX) 0.4 MG CAPS capsule TAKE ONE CAPSULE BY MOUTH TWICE DAILY 180 capsule 3   warfarin (COUMADIN) 3 MG tablet TAKE TWO TO THREE TABLETS DAILY OR AS DIRECTED BY COUMADIN CLINIC 30 tablet 0   potassium chloride SA (KLOR-CON M) 20 MEQ tablet Take 2 tablets (40 mEq total) by mouth 2 (two) times daily.     torsemide (DEMADEX) 20 MG tablet Take 4 tablets (80 mg total) by mouth every morning AND 3 tablets (60 mg total) every evening. 200 tablet 5   No current facility-administered medications for this visit.   BP (!) 115/102   Pulse 100   Wt 182 lb 3.2 oz (82.6 kg)   SpO2 96%   BMI 26.14 kg/m    Wt Readings from Last 3 Encounters:  03/14/24 182 lb 3.2 oz (82.6 kg)  11/01/23 172 lb (78 kg)  07/06/23 174 lb (78.9 kg)   PHYSICAL EXAM: General: NAD Neck: JVP 10-12 cm, no thyromegaly or thyroid nodule.  Lungs: Clear to auscultation bilaterally with normal respiratory effort. CV: Nondisplaced PMI.  Heart regular S1/S2, no S3/S4, 2/6 HSM apex.  2+ edema to knees.  No carotid bruit.  Normal pedal pulses.  Abdomen: Soft, nontender, no hepatosplenomegaly, no distention.  Skin: Intact without lesions or rashes.  Neurologic: Alert and oriented x 3.  Psych: Normal affect. Extremities: No clubbing or cyanosis.  HEENT: Normal.   Assessment/Plan:  1.  Chronic Systolic Heart Failure: Ischemic cardiomyopathy.  Echo 9/23 with stable EF 45-50%, LV global hypokinesis, RV systolic function normal. Filling pressures normal on RHC (9/23). TEE in 10/23 showed EF 45%, diffuse HK, RV mildly decreased, moderate Nathaniel, bioprosthetic AoV stable with mean gradient 6 mmHg, no LAA. Echo 5/24 EF 30-35%, RV mildly reduced, Nathaniel moderate-severe. Suspect further drop in EF may be tachycardia-mediated with  permanent AF.  NYHA class III-IIIb symptoms.  He is volume overloaded by exam and REDS clip.  - Furoscix 80 mg [place] daily x 2 days.  - After Furoscix completed, take torsemide 80 qam/60 qpm (increased dose).  Increase KCl to 40 bid.  BMET/BNP today, BMET in 10 days.  - Continue ramipril 2.5 daily. If BP allows in future, transition to [place].   - Continue Toprol XL 50 mg bid.  - Continue spironolactone 25 mg daily. - Off Jardiance with h/o E coli UTI, may be able to restart in future.  - I will arrange for RHC next week. If he is still significantly volume overloaded, I will admit him for IV diuresis. We discussed risks/benefits and he agrees to procedure.  - Wear compression stockings.  2.  CAD: s/p CABG and Redo CABG. LHC 06/2014 showed significant coronary disease w/ no good interventional targets (LIMA-LAD patent with 40-50% stenosis in LAD after touchdown, sequential SVG-ramus/OM/diagonal with only the ramus branch still intact (known from prior cath), SVG-PDA from original surgery TO, SVG-PDA from redo surgery with long 50-60% mid-graft stenosis). NSTEMI 9/23, repeat LHC showed occlusion of the 2nd  SVG-PDA and 99% stenosis in the SVG-ramus.  Probably not much benefit to intervention on SVG-ramus.  Small territory covered and complex disease in the SVG.  No chest pain.  - Now off ASA with warfarin use. - Continue atorvastatin 20 mg daily + Zetia.  - Needs to take Repatha, he had been off but just got a dose to restart => take today.  - Check lipids next visit when he has been on Repatha.  3. Atrial fibrillation: Now permanent.  Seen by EP, not thought to be good ablation candidate.  - Continue warfarin. INR followed by coumadin clinic   - Continue Toprol XL 50 mg bid  4. Bioprosthetic AVR: Normal function on 5/24 echo.  5. CKD stage 3: Follow closely with diuresis.  6. Type 2DM:  - off SLGT2i given UTIs.  - managed by PCP 7. PAD:  AAA s/p EVR, LE PAD + Carotid Artery Disease s/p Rt CEA. No  current claudication symptoms.  - Followed by Dr. Myra Gianotti. - Continue lipid lowering agents per above.   Followup 3 wks with APP.    I spent 41 minutes reviewing records, interviewing/examining patient, and managing orders.   [name] 03/14/2024

## 2024-03-14 NOTE — H&P (View-Only) (Signed)
 [facility] CLINIC NOTE                                                                                                                                                                                                                                                                                                                                                                                                                                                                                                   Patient ID: Nathaniel Blitch., male   DOB: 05/30/1932, 89 y.o.   MRN: [id number] PCP: Nathaniel Majestic, MD Cardiology: Dr. Shirlee [name]  89 y.o. with history of extensive vascular disease including CAD s/p CABG and redo CABG, AAA s/p endovascular repair in 2015, PAD, and carotid stenosis as well as prior AVR.                             After his AAA repair, he was noted to be in atrial fibrillation.  He had had a prior episode of atrial fibrillation after his cardiac surgery in 2007.  Atrial fibrillation was rate-controlled.  He was started on warfarin and Toprol XL.  He developed exertional dyspnea and fatigue and was cardioverted back to SR 06/06/14.  Echo 06/15 EF worsened to 25-30% with stable bioprosthetic aortic valve, mildly dilated RV with normal systolic function.  LHC 07/15 showed stable anatomy.  The LIMA-LAD was patent, the sequential SVG-OM/ramus/D was patent only to ramus but this is the same as the prior cath, and the SVG-PDA was patent with a long 50-60% mid-graft stenosis.  No intervention.  Of note, at cath he was noted to be back in atrial fibrillation.  He was started on amiodarone and had DCCV again on 08/21/14.  This was successful.  He has been off amiodarone due to side effects (hallucinations).  Echo in 1/16 showed recovery of EF to 55-60%.    He was admitted in 1/19 with bronchitis/wheezing/dyspnea and was found to be in atrial fibrillation with RVR.  He tested positive for metapneumovirus.   Echo showed EF 45-50% with normal bioprosthetic aortic valve. He was diuresed for volume overload while in the hospital. He had DCCV to NSR.    Carotid dopplers in 1/19 showed 80-99% RICA stenosis, he had right CEA in 2/19 with no complications.   At appointment in early 6/20, Nathaniel [name] was noted to be back in atrial fibrillation with RVR and mild CHF.  He was started on Toprol XL and Lasix was increased.  He had TEE-guided DCCV to NSR in 6/20.  TEE showed EF 50%, mild diffuse hypokinesis, mildly decreased RV systolic function, normally functioning bioprosthetic aortic valve.   In 2021, he was admitted for Tikosyn initiation.  In 12/21, he went into atrial fibrillation again and was cardioverted back to NSR.   Echo in 1/22 showed EF 45-50%, diffuse hypokinesis, mildly decreased RV systolic function, moderate biatrial enlargement, bioprosthetic valve with mean gradient 8 mmHg.    In 2/22, he went into atrial fibrillation and had TEE-guided DCCV back to NSR.  TEE showed EF 45-50%.    In 3/22, he was back in the ER with atrial fibrillation triggered by [name] most likely.  He had a cardioversion back to NSR.   In 10/22, patient developed urinary retention and an E coli UTI.  In the setting of this, he had a fall at home and was unable to get up due to weakness for hours.  He developed rhabdomyolysis.  This resolved with minimal renal damage with IV fluid hydration and he was discharged to rehab.   Follow up 2/23 he was in atrial fibrillation, TEE/DCCV arranged however INR subtherapeutic. He cancelled rescheduled procedure due to a death in the family.  Follow up 3/23. Remained in AF. Volume overloaded. Lasix increased for 3 days. Underwent TEE guided DCCV on 03/19/22.  TEE showed EF 50-55%, mildly decreased RV function, normal bioprosthetic aortic valve, mild Nathaniel and mild mitral stenosis (mean gradient 3 mmHg).    Recently diagnosed with stage IA2 NSCLC RUL. Saw Radiation Oncology and Pulmonary  and decided on empiric treatment with SBRT.  He has completed radiation.   Admitted 9/23 with NSTEMI and AF with RVR. Underwent R/LHC showing normal filling pressures, known occlusion of native coronaries and SVG-RCA from original CABG. New occlusion of SVG-PDA from CABG#2. Long 99% stenosis in the SVG-ramus. Ramus is relatively small vessel. Patent LIMA-LAD with collaterals to RCA territory. Case was discussed with Dr. Lynnette Caffey, wouldn't benefit from intervention, medically managed. Patient declined re-challenge of amiodarone and was able to have successful TEE/DCCV to NSR.  TEE showed EF 45% with normal RV, mild-moderate Nathaniel, normal bioprosthetic aortic valve, no LA appendage thrombus. Discharged home, weight 184 lbs.  Follow up 9/23, back in atrial fibrillation with HR 116. Toprol increased to 25 bid and arranged for TEE/DCCV.  TEE/DCCV (10/23) showed EF 45%, diffuse HK, RV mildly decreased, moderate Nathaniel, Bioprosthetic AoV stable with mean gradient 6 mmHg, no LAA. Proceeded with successful DCCV to NSR.  However, later in 10/23 he was back in AF and has been in AF since that time.   Echo in 5/24 showed EF lower at 30-35%, moderate LV dilation, mild RV dysfunction, moderate-severe Nathaniel, bioprosthetic aortic valve appeared normal, IVC normal.   He returns for followup of CHF.  Weight up over 10 lbs. He is short of breath just walking around his house.  Worse x 2-3 weeks.  +Orthopnea, sleeps on several pillows.  No chest pain.  No lightheadedness.  No palpitations. He has not taken any meds yet today.   ECG: AF, QRS 142 IVCD (personally reviewed)  REDS 42%   Labs (1/19): K 4.9, creatinine 1.19 Labs (2/19): K 4.2, creatinine 0.89 Labs (3/19): LDL 86, HDL 45, TGs 245 Labs (5/19): LDL 110, HDL 39 Labs (6/19): K 4.1, creatinine 1.07 Labs (11/19): LDL 14, HDL 56 Labs (6/20): K 4.1, creatinine 1.0 Labs (7/20): K 4.3, creatinine 1.07 Labs (9/21): LDL 129, hgb 14.8, K 4.1, creatinine 1.02 Labs (12/21):  LDL 20, HDL 46, K 4.4, creatinine 7.84 Labs (1/22): LDL 45, HDL 49, K 4.3, creatinine 1.1, BNP 184 Labs (3/22): K 4.4, creatinine 1.10, LDL 15, HDL 49 Labs (6/22): K 4.3, creatinine 1.11, LDL 121 Labs (7/22): K 4.3, creatinine 1.23, hgb 14.5 Labs (11/22): K 4.5, creatinine 1.14 Labs (2/23): K 4.3, creatinine 1.16 Labs (7/23): K 4.4, creatinine 1.24 Labs (9/23): K 4.1, creatinine 1.19 Labs (10/23): K 4.2, creatinine 1.31 Labs (1/24): K 4.8, SCr 1.13, Hgb A1c 7.7 Labs (4/24): K 5, creatinine 1.56 => 1.33, BNP 726 Labs (5/24): K 4.7, SCr 1.29 BNP 895 Labs (5/24): K 4.9, SCr 2.14, BNP 398 Labs (6/24): K 4.6, SCr 1.66  Labs (7/24): LDL 92 Labs (11/24): K 4, creatinine 1.34, pro-BNP 5110  PMH: 1. PAD: Peripheral arterial dopplers in 2012 showed > 75% bilateral SFA stenosis. Peripheral arterial dopplers in 10/14 showed > 50% focal bilateral SFA stenoses.  2. AAA: [place] 4/15 with 4.4 cm AAA but concern for penetrating ulcers. CTA abdomen showed 4.4 cm AAA with penetrating ulcers and possible pseudoaneurysms. Now s/p endovascular repair of AAA in 5/15 with no complications.  3. Carotid stenosis: Carotid dopplers (4/15) with 60-79% bilateral ICA stenosis. Carotid dopplers (11/15) with 60-79% bilateral ICA stenosis.  Carotid dopplers 6/16 with 60-79% bilateral ICA stenosis. - Carotid dopplers (1/18): 60-79% RICA stenosis.  - Carotid dopplers (1/19): 80-99% RICA stenosis => Right CEA in 2/19.  - Carotid dopplers (11/19): 40-59% bilateral stenosis.  - Carotid dopplers (1/21): 1-39% BICA stenosis.  4. CAD: CABG 1989 with LIMA-LAD, SVG-D, seq SVG-ramus and OM, sequential SVG-PDA/PLV. SVG-PDA/PLV found to be occluded on cath prior to AVR, so patient had SVG-PDA with AVR in 2007. LHC (7/15) with LIMA-LAD patent with 40-50% stenosis in LAD after touchdown, sequential SVG-ramus/OM/diagonal with only the ramus branch still intact (known from prior cath), SVG-PDA from original surgery TO, SVG-PDA from redo surgery  with long 50-60% mid-graft stenosis.  No target for intervention.  - LHC (9/23): known occlusion of native coronaries and SVG-RCA from original CABG, new occlusion of SVG-PDA from CABG#2,  long 99% stenosis in the SVG-ramus (was sequential SVG-ramus/OM/D but OM and D branches noted to be lost on prior cath), ramus is relatively small vessel, patent LIMA-LAD with collaterals to RCA territory. Case reviewed with Dr. Lynnette Caffey, probably not much benefit to intervention on SVG-ramus. Small territory covered and complex disease in SVG. Medical management advised. 5. Severe aortic stenosis: Bioprosthetic AVR in 2007.  6. Atrial fibrillation: Paroxysmal. Initially noted after cardiac surgery, then again after AAA repair.  DCCV to NSR 06/06/14.  DCCV to NSR again 08/21/14.  - DCCV to NSR in 1/19.  - DCCV to NSR in 6/20. .  - DCCV to NSR 12/21, 2/22, 3/22, 4/23, 9/23, 10/23 - Now permanent, off Tikosyn.  7. Type II diabetes  8. Gout  9. GERD  10. Hyperlipidemia  11. HTN 12. GERD  13. Chronic systolic CHF: Echo (6/15) with EF worsened to 25-30% with grade II diastolic dysfunction, stable bioprosthetic aortic valve, mildly dilated RV with normal systolic function.  Echo (1/16) with EF 55-60%, basal inferior hypokinesis, mild LVH, bioprosthetic aortic valve with mean gradient 13 mmHg, mild Nathaniel, severe LAE.  - Echo (1/19): EF 45-50%, mild Nathaniel, normal bioprosthetic aortic valve.  - TEE (6/20): EF 50% with mild diffuse hypokinesis, mildly decreased RV systolic function with mild RV enlargement, bioprosthetic aortic valve looked ok, mild Nathaniel. - Echo (1/22):  EF 45-50%, diffuse hypokinesis, mildly decreased RV systolic function, moderate biatrial enlargement, bioprosthetic valve with mean gradient 8 mmHg.   - TEE (2/22): EF 45-50%, RV normal, moderate biatrial enlargement, bioprosthetic AoV with mean gradient 6 mmHg, mild Nathaniel.  - TEE (4/23): EF 50-55%, mildly decreased RV function, normal bioprosthetic aortic valve, mild  Nathaniel and mild mitral stenosis (mean gradient 3 mmHg) - TEE (9/23): EF 45%, RV normal, no LAA thrombus, no PFO/ASD, mild to moderate Nathaniel w/ moderate MAC, AoV without stenosis/regurgitation - TEE (10/23): EF 45%, RV mildly decreased, no LAA thrombus, no PFO/ASD, moderate Nathaniel, AoV without stenosis/regurgitation, mean gradient 6 mmHg. - Echo (5/24): EF 30-35%, moderate LV dilation, mild RV dysfunction, moderate-severe Nathaniel, bioprosthetic aortic valve appeared normal, IVC normal.  14. BPH  15. Nonsmall cell lung cancer: Suspected by imaging, treated in 2023 with radiation.  16. Fall with rhabdomyolysis (10/22)   SH: Widower, 2 children, raised his 3 grand-daughters, lives in [place], [place].  Occasional ETOH, no smoking.   FH: Father died from ruptured AAA.   ROS: All systems reviewed and negative except as per HPI.   Current Outpatient Medications  Medication Sig Dispense Refill   atorvastatin (LIPITOR) 20 MG tablet Take 1 tablet (20 mg total) by mouth daily. Alternating with 30mg  30 tablet 5   Coenzyme Q10 (CO Q10) 200 MG CAPS Take 200 mg by mouth daily.     dorzolamide-timolol (COSOPT) 22.3-6.8 MG/ML ophthalmic solution Place 1 drop into both eyes every evening.     ezetimibe (ZETIA) 10 MG tablet TAKE ONE TABLET BY MOUTH AT BEDTIME 90 tablet 3   Furosemide (FUROSCIX) 80 MG/10ML CTKT Inject 80 mg into the skin as directed.     latanoprost (XALATAN) 0.005 % ophthalmic solution Place 1 drop into both eyes at bedtime.     METAMUCIL FIBER PO Take 1 Dose by mouth 2 (two) times a week. As needed     metoprolol succinate (TOPROL-XL) 50 MG 24 hr tablet Take 1 tablet (50 mg total) by mouth in the morning and at bedtime. Take with or immediately following a meal. 60 tablet 3   Misc  Natural Products (GLUCOSAMINE CHONDROITIN TRIPLE) TABS Take 1 tablet by mouth daily.     ramipril (ALTACE) 2.5 MG capsule Take 1 capsule (2.5 mg total) by mouth daily. 30 capsule 3   REPATHA SURECLICK 140 MG/ML SOAJ INJECT 1  PEN INTO SKIN EVERY 14 DAYS 2 mL 11   sildenafil (VIAGRA) 25 MG tablet TAKE ONE TABLET ONCE A DAY AS NEEDED 10 tablet 4   spironolactone (ALDACTONE) 25 MG tablet TAKE ONE TABLET BY MOUTH EVERY DAY 30 tablet 5   tamsulosin (FLOMAX) 0.4 MG CAPS capsule TAKE ONE CAPSULE BY MOUTH TWICE DAILY 180 capsule 3   warfarin (COUMADIN) 3 MG tablet TAKE TWO TO THREE TABLETS DAILY OR AS DIRECTED BY COUMADIN CLINIC 30 tablet 0   potassium chloride SA (KLOR-CON M) 20 MEQ tablet Take 2 tablets (40 mEq total) by mouth 2 (two) times daily.     torsemide (DEMADEX) 20 MG tablet Take 4 tablets (80 mg total) by mouth every morning AND 3 tablets (60 mg total) every evening. 200 tablet 5   No current facility-administered medications for this visit.   BP (!) 115/102   Pulse 100   Wt 182 lb 3.2 oz (82.6 kg)   SpO2 96%   BMI 26.14 kg/m    Wt Readings from Last 3 Encounters:  03/14/24 182 lb 3.2 oz (82.6 kg)  11/01/23 172 lb (78 kg)  07/06/23 174 lb (78.9 kg)   PHYSICAL EXAM: General: NAD Neck: JVP 10-12 cm, no thyromegaly or thyroid nodule.  Lungs: Clear to auscultation bilaterally with normal respiratory effort. CV: Nondisplaced PMI.  Heart regular S1/S2, no S3/S4, 2/6 HSM apex.  2+ edema to knees.  No carotid bruit.  Normal pedal pulses.  Abdomen: Soft, nontender, no hepatosplenomegaly, no distention.  Skin: Intact without lesions or rashes.  Neurologic: Alert and oriented x 3.  Psych: Normal affect. Extremities: No clubbing or cyanosis.  HEENT: Normal.   Assessment/Plan:  1.  Chronic Systolic Heart Failure: Ischemic cardiomyopathy.  Echo 9/23 with stable EF 45-50%, LV global hypokinesis, RV systolic function normal. Filling pressures normal on RHC (9/23). TEE in 10/23 showed EF 45%, diffuse HK, RV mildly decreased, moderate Nathaniel, bioprosthetic AoV stable with mean gradient 6 mmHg, no LAA. Echo 5/24 EF 30-35%, RV mildly reduced, Nathaniel moderate-severe. Suspect further drop in EF may be tachycardia-mediated with  permanent AF.  NYHA class III-IIIb symptoms.  He is volume overloaded by exam and REDS clip.  - Furoscix 80 mg [place] daily x 2 days.  - After Furoscix completed, take torsemide 80 qam/60 qpm (increased dose).  Increase KCl to 40 bid.  BMET/BNP today, BMET in 10 days.  - Continue ramipril 2.5 daily. If BP allows in future, transition to [place].   - Continue Toprol XL 50 mg bid.  - Continue spironolactone 25 mg daily. - Off Jardiance with h/o E coli UTI, may be able to restart in future.  - I will arrange for RHC next week. If he is still significantly volume overloaded, I will admit him for IV diuresis. We discussed risks/benefits and he agrees to procedure.  - Wear compression stockings.  2.  CAD: s/p CABG and Redo CABG. LHC 06/2014 showed significant coronary disease w/ no good interventional targets (LIMA-LAD patent with 40-50% stenosis in LAD after touchdown, sequential SVG-ramus/OM/diagonal with only the ramus branch still intact (known from prior cath), SVG-PDA from original surgery TO, SVG-PDA from redo surgery with long 50-60% mid-graft stenosis). NSTEMI 9/23, repeat LHC showed occlusion of the 2nd  SVG-PDA and 99% stenosis in the SVG-ramus.  Probably not much benefit to intervention on SVG-ramus.  Small territory covered and complex disease in the SVG.  No chest pain.  - Now off ASA with warfarin use. - Continue atorvastatin 20 mg daily + Zetia.  - Needs to take Repatha, he had been off but just got a dose to restart => take today.  - Check lipids next visit when he has been on Repatha.  3. Atrial fibrillation: Now permanent.  Seen by EP, not thought to be good ablation candidate.  - Continue warfarin. INR followed by coumadin clinic   - Continue Toprol XL 50 mg bid  4. Bioprosthetic AVR: Normal function on 5/24 echo.  5. CKD stage 3: Follow closely with diuresis.  6. Type 2DM:  - off SLGT2i given UTIs.  - managed by PCP 7. PAD:  AAA s/p EVR, LE PAD + Carotid Artery Disease s/p Rt CEA. No  current claudication symptoms.  - Followed by Dr. Myra Gianotti. - Continue lipid lowering agents per above.   Followup 3 wks with APP.    I spent 41 minutes reviewing records, interviewing/examining patient, and managing orders.   [name] 03/14/2024

## 2024-03-15 ENCOUNTER — Telehealth: Payer: Self-pay

## 2024-03-15 ENCOUNTER — Other Ambulatory Visit: Payer: Self-pay

## 2024-03-15 DIAGNOSIS — I5042 Chronic combined systolic (congestive) and diastolic (congestive) heart failure: Secondary | ICD-10-CM

## 2024-03-15 NOTE — Progress Notes (Signed)
 Orders placed for cath 03/21/24

## 2024-03-15 NOTE — Telephone Encounter (Signed)
-----   Message from Nurse [place] R sent at 03/15/2024  8:32 AM EDT ----- Regarding: right heart cath Pt is scheduled for a right heart cath on 03/21/24. Dr. Shirlee Latch said he wants to keep his INR around 2 if possible before his cath.

## 2024-03-15 NOTE — Telephone Encounter (Signed)
 Called and spoke with pt. Made him aware Dr Shirlee Latch would like INR around 2.0 for heart cath on Tuesday. Scheduled pt to have INR checked on Monday, 03/20/24 at 1:30pm. Pt verbalized understanding.

## 2024-03-20 ENCOUNTER — Ambulatory Visit (INDEPENDENT_AMBULATORY_CARE_PROVIDER_SITE_OTHER)

## 2024-03-20 DIAGNOSIS — I700 Atherosclerosis of aorta: Secondary | ICD-10-CM | POA: Diagnosis not present

## 2024-03-20 DIAGNOSIS — I6523 Occlusion and stenosis of bilateral carotid arteries: Secondary | ICD-10-CM | POA: Diagnosis not present

## 2024-03-20 DIAGNOSIS — I255 Ischemic cardiomyopathy: Secondary | ICD-10-CM | POA: Diagnosis not present

## 2024-03-20 DIAGNOSIS — Z7901 Long term (current) use of anticoagulants: Secondary | ICD-10-CM | POA: Diagnosis not present

## 2024-03-20 DIAGNOSIS — E785 Hyperlipidemia, unspecified: Secondary | ICD-10-CM | POA: Diagnosis not present

## 2024-03-20 DIAGNOSIS — Z85118 Personal history of other malignant neoplasm of bronchus and lung: Secondary | ICD-10-CM | POA: Diagnosis not present

## 2024-03-20 DIAGNOSIS — I2510 Atherosclerotic heart disease of native coronary artery without angina pectoris: Secondary | ICD-10-CM | POA: Diagnosis not present

## 2024-03-20 DIAGNOSIS — J811 Chronic pulmonary edema: Secondary | ICD-10-CM | POA: Diagnosis not present

## 2024-03-20 DIAGNOSIS — N1832 Chronic kidney disease, stage 3b: Secondary | ICD-10-CM | POA: Diagnosis not present

## 2024-03-20 DIAGNOSIS — Z8249 Family history of ischemic heart disease and other diseases of the circulatory system: Secondary | ICD-10-CM | POA: Diagnosis not present

## 2024-03-20 DIAGNOSIS — Z452 Encounter for adjustment and management of vascular access device: Secondary | ICD-10-CM | POA: Diagnosis not present

## 2024-03-20 DIAGNOSIS — Z5181 Encounter for therapeutic drug level monitoring: Secondary | ICD-10-CM | POA: Diagnosis not present

## 2024-03-20 DIAGNOSIS — I130 Hypertensive heart and chronic kidney disease with heart failure and stage 1 through stage 4 chronic kidney disease, or unspecified chronic kidney disease: Secondary | ICD-10-CM | POA: Diagnosis not present

## 2024-03-20 DIAGNOSIS — Z79899 Other long term (current) drug therapy: Secondary | ICD-10-CM | POA: Diagnosis not present

## 2024-03-20 DIAGNOSIS — M109 Gout, unspecified: Secondary | ICD-10-CM | POA: Diagnosis not present

## 2024-03-20 DIAGNOSIS — I252 Old myocardial infarction: Secondary | ICD-10-CM | POA: Diagnosis not present

## 2024-03-20 DIAGNOSIS — Z923 Personal history of irradiation: Secondary | ICD-10-CM | POA: Diagnosis not present

## 2024-03-20 DIAGNOSIS — Z953 Presence of xenogenic heart valve: Secondary | ICD-10-CM | POA: Diagnosis not present

## 2024-03-20 DIAGNOSIS — I340 Nonrheumatic mitral (valve) insufficiency: Secondary | ICD-10-CM | POA: Diagnosis not present

## 2024-03-20 DIAGNOSIS — E1151 Type 2 diabetes mellitus with diabetic peripheral angiopathy without gangrene: Secondary | ICD-10-CM | POA: Diagnosis not present

## 2024-03-20 DIAGNOSIS — I4821 Permanent atrial fibrillation: Secondary | ICD-10-CM | POA: Diagnosis not present

## 2024-03-20 DIAGNOSIS — Z87891 Personal history of nicotine dependence: Secondary | ICD-10-CM | POA: Diagnosis not present

## 2024-03-20 DIAGNOSIS — J90 Pleural effusion, not elsewhere classified: Secondary | ICD-10-CM | POA: Diagnosis not present

## 2024-03-20 DIAGNOSIS — N400 Enlarged prostate without lower urinary tract symptoms: Secondary | ICD-10-CM | POA: Diagnosis not present

## 2024-03-20 DIAGNOSIS — J449 Chronic obstructive pulmonary disease, unspecified: Secondary | ICD-10-CM | POA: Diagnosis not present

## 2024-03-20 DIAGNOSIS — I5023 Acute on chronic systolic (congestive) heart failure: Secondary | ICD-10-CM | POA: Diagnosis not present

## 2024-03-20 DIAGNOSIS — I509 Heart failure, unspecified: Secondary | ICD-10-CM | POA: Diagnosis not present

## 2024-03-20 DIAGNOSIS — I110 Hypertensive heart disease with heart failure: Secondary | ICD-10-CM | POA: Diagnosis not present

## 2024-03-20 DIAGNOSIS — E1122 Type 2 diabetes mellitus with diabetic chronic kidney disease: Secondary | ICD-10-CM | POA: Diagnosis not present

## 2024-03-20 DIAGNOSIS — I25810 Atherosclerosis of coronary artery bypass graft(s) without angina pectoris: Secondary | ICD-10-CM | POA: Diagnosis not present

## 2024-03-20 DIAGNOSIS — I714 Abdominal aortic aneurysm, without rupture, unspecified: Secondary | ICD-10-CM | POA: Diagnosis not present

## 2024-03-20 DIAGNOSIS — K219 Gastro-esophageal reflux disease without esophagitis: Secondary | ICD-10-CM | POA: Diagnosis not present

## 2024-03-20 DIAGNOSIS — I272 Pulmonary hypertension, unspecified: Secondary | ICD-10-CM | POA: Diagnosis not present

## 2024-03-20 DIAGNOSIS — R918 Other nonspecific abnormal finding of lung field: Secondary | ICD-10-CM | POA: Diagnosis not present

## 2024-03-20 DIAGNOSIS — I10 Essential (primary) hypertension: Secondary | ICD-10-CM | POA: Diagnosis not present

## 2024-03-20 LAB — POCT INR: INR: 2.8 (ref 2.0–3.0)

## 2024-03-20 NOTE — Patient Instructions (Addendum)
 Description   DO NOT TAKE WARFARIN IN THE MORNING PRIOR TO CATH  Since you have already take today's dose, eat greens today and continue on same dosage of Warfarin 3 tablets daily.  Recheck INR in 1 week Pt aware of risks. Stay consistent with greens each week.  Coumadin Clinic [phone].

## 2024-03-21 ENCOUNTER — Other Ambulatory Visit: Payer: Self-pay

## 2024-03-21 ENCOUNTER — Inpatient Hospital Stay (HOSPITAL_COMMUNITY)

## 2024-03-21 ENCOUNTER — Inpatient Hospital Stay (HOSPITAL_COMMUNITY)
Admission: RE | Admit: 2024-03-21 | Discharge: 2024-03-24 | DRG: 286 | Disposition: A | Discharge status: Home Health | Attending: Cardiology | Admitting: Cardiology

## 2024-03-21 ENCOUNTER — Encounter (HOSPITAL_COMMUNITY): Payer: Self-pay | Admitting: Cardiology

## 2024-03-21 ENCOUNTER — Encounter (HOSPITAL_COMMUNITY)
Admission: RE | Disposition: A | Discharge status: Home / Self Care | Payer: Self-pay | Source: Home / Self Care | Attending: Cardiology

## 2024-03-21 DIAGNOSIS — I4821 Permanent atrial fibrillation: Secondary | ICD-10-CM | POA: Diagnosis present

## 2024-03-21 DIAGNOSIS — Z87891 Personal history of nicotine dependence: Secondary | ICD-10-CM

## 2024-03-21 DIAGNOSIS — Z923 Personal history of irradiation: Secondary | ICD-10-CM

## 2024-03-21 DIAGNOSIS — Z79899 Other long term (current) drug therapy: Secondary | ICD-10-CM | POA: Diagnosis not present

## 2024-03-21 DIAGNOSIS — I2510 Atherosclerotic heart disease of native coronary artery without angina pectoris: Secondary | ICD-10-CM | POA: Diagnosis present

## 2024-03-21 DIAGNOSIS — I6523 Occlusion and stenosis of bilateral carotid arteries: Secondary | ICD-10-CM | POA: Diagnosis present

## 2024-03-21 DIAGNOSIS — I130 Hypertensive heart and chronic kidney disease with heart failure and stage 1 through stage 4 chronic kidney disease, or unspecified chronic kidney disease: Secondary | ICD-10-CM | POA: Diagnosis present

## 2024-03-21 DIAGNOSIS — I5023 Acute on chronic systolic (congestive) heart failure: Secondary | ICD-10-CM | POA: Diagnosis present

## 2024-03-21 DIAGNOSIS — N1832 Chronic kidney disease, stage 3b: Secondary | ICD-10-CM | POA: Diagnosis present

## 2024-03-21 DIAGNOSIS — M109 Gout, unspecified: Secondary | ICD-10-CM | POA: Diagnosis present

## 2024-03-21 DIAGNOSIS — E785 Hyperlipidemia, unspecified: Secondary | ICD-10-CM | POA: Diagnosis present

## 2024-03-21 DIAGNOSIS — Z87440 Personal history of urinary (tract) infections: Secondary | ICD-10-CM

## 2024-03-21 DIAGNOSIS — I272 Pulmonary hypertension, unspecified: Secondary | ICD-10-CM | POA: Diagnosis present

## 2024-03-21 DIAGNOSIS — I25810 Atherosclerosis of coronary artery bypass graft(s) without angina pectoris: Secondary | ICD-10-CM | POA: Diagnosis present

## 2024-03-21 DIAGNOSIS — I714 Abdominal aortic aneurysm, without rupture, unspecified: Secondary | ICD-10-CM | POA: Diagnosis present

## 2024-03-21 DIAGNOSIS — E1122 Type 2 diabetes mellitus with diabetic chronic kidney disease: Secondary | ICD-10-CM | POA: Diagnosis present

## 2024-03-21 DIAGNOSIS — I110 Hypertensive heart disease with heart failure: Secondary | ICD-10-CM | POA: Diagnosis not present

## 2024-03-21 DIAGNOSIS — J90 Pleural effusion, not elsewhere classified: Secondary | ICD-10-CM | POA: Diagnosis not present

## 2024-03-21 DIAGNOSIS — I4891 Unspecified atrial fibrillation: Principal | ICD-10-CM

## 2024-03-21 DIAGNOSIS — Z953 Presence of xenogenic heart valve: Secondary | ICD-10-CM

## 2024-03-21 DIAGNOSIS — J811 Chronic pulmonary edema: Secondary | ICD-10-CM | POA: Diagnosis not present

## 2024-03-21 DIAGNOSIS — I5042 Chronic combined systolic (congestive) and diastolic (congestive) heart failure: Secondary | ICD-10-CM

## 2024-03-21 DIAGNOSIS — I10 Essential (primary) hypertension: Secondary | ICD-10-CM | POA: Diagnosis not present

## 2024-03-21 DIAGNOSIS — I252 Old myocardial infarction: Secondary | ICD-10-CM

## 2024-03-21 DIAGNOSIS — I255 Ischemic cardiomyopathy: Secondary | ICD-10-CM | POA: Diagnosis present

## 2024-03-21 DIAGNOSIS — K219 Gastro-esophageal reflux disease without esophagitis: Secondary | ICD-10-CM | POA: Diagnosis present

## 2024-03-21 DIAGNOSIS — Z85118 Personal history of other malignant neoplasm of bronchus and lung: Secondary | ICD-10-CM

## 2024-03-21 DIAGNOSIS — I340 Nonrheumatic mitral (valve) insufficiency: Secondary | ICD-10-CM | POA: Diagnosis present

## 2024-03-21 DIAGNOSIS — L8990 Pressure ulcer of unspecified site, unspecified stage: Secondary | ICD-10-CM | POA: Insufficient documentation

## 2024-03-21 DIAGNOSIS — Z7901 Long term (current) use of anticoagulants: Secondary | ICD-10-CM

## 2024-03-21 DIAGNOSIS — I509 Heart failure, unspecified: Secondary | ICD-10-CM | POA: Diagnosis not present

## 2024-03-21 DIAGNOSIS — R918 Other nonspecific abnormal finding of lung field: Secondary | ICD-10-CM | POA: Diagnosis not present

## 2024-03-21 DIAGNOSIS — N400 Enlarged prostate without lower urinary tract symptoms: Secondary | ICD-10-CM | POA: Diagnosis present

## 2024-03-21 DIAGNOSIS — Z8249 Family history of ischemic heart disease and other diseases of the circulatory system: Secondary | ICD-10-CM | POA: Diagnosis not present

## 2024-03-21 DIAGNOSIS — E1151 Type 2 diabetes mellitus with diabetic peripheral angiopathy without gangrene: Secondary | ICD-10-CM | POA: Diagnosis present

## 2024-03-21 DIAGNOSIS — Z452 Encounter for adjustment and management of vascular access device: Secondary | ICD-10-CM | POA: Diagnosis not present

## 2024-03-21 DIAGNOSIS — J449 Chronic obstructive pulmonary disease, unspecified: Secondary | ICD-10-CM | POA: Diagnosis not present

## 2024-03-21 DIAGNOSIS — I700 Atherosclerosis of aorta: Secondary | ICD-10-CM | POA: Diagnosis not present

## 2024-03-21 HISTORY — PX: RIGHT HEART CATH: CATH118263

## 2024-03-21 LAB — COMPREHENSIVE METABOLIC PANEL WITH GFR
ALT: 10 U/L (ref 0–44)
AST: 29 U/L (ref 15–41)
Albumin: 3.7 g/dL (ref 3.5–5.0)
Alkaline Phosphatase: 82 U/L (ref 38–126)
Anion gap: 11 (ref 5–15)
BUN: 33 mg/dL — ABNORMAL HIGH (ref 8–23)
CO2: 25 mmol/L (ref 22–32)
Calcium: 9.1 mg/dL (ref 8.9–10.3)
Chloride: 99 mmol/L (ref 98–111)
Creatinine, Ser: 1.51 mg/dL — ABNORMAL HIGH (ref 0.61–1.24)
GFR, Estimated: 44 mL/min — ABNORMAL LOW (ref >60)
Glucose, Bld: 133 mg/dL — ABNORMAL HIGH (ref 70–99)
Potassium: 5.6 mmol/L — ABNORMAL HIGH (ref 3.5–5.1)
Sodium: 135 mmol/L (ref 135–145)
Total Bilirubin: 2.1 mg/dL — ABNORMAL HIGH (ref 0.0–1.2)
Total Protein: 7.1 g/dL (ref 6.5–8.1)

## 2024-03-21 LAB — PROTIME-INR
INR: 3.6 — ABNORMAL HIGH (ref 0.8–1.2)
Prothrombin Time: 35.9 s — ABNORMAL HIGH (ref 11.4–15.2)

## 2024-03-21 LAB — MAGNESIUM: Magnesium: 2.1 mg/dL (ref 1.7–2.4)

## 2024-03-21 LAB — POCT I-STAT EG7
Acid-Base Excess: 2 mmol/L (ref 0.0–2.0)
Acid-Base Excess: 3 mmol/L — ABNORMAL HIGH (ref 0.0–2.0)
Bicarbonate: 28 mmol/L (ref 20.0–28.0)
Bicarbonate: 27.7 mmol/L (ref 20.0–28.0)
Calcium, Ion: 1.19 mmol/L (ref 1.15–1.40)
Calcium, Ion: 1.11 mmol/L — ABNORMAL LOW (ref 1.15–1.40)
HCT: 40 % (ref 39.0–52.0)
HCT: 39 % (ref 39.0–52.0)
Hemoglobin: 13.6 g/dL (ref 13.0–17.0)
Hemoglobin: 13.3 g/dL (ref 13.0–17.0)
O2 Saturation: 56 %
O2 Saturation: 60 %
Potassium: 4.5 mmol/L (ref 3.5–5.1)
Potassium: 4.3 mmol/L (ref 3.5–5.1)
Sodium: 138 mmol/L (ref 135–145)
Sodium: 140 mmol/L (ref 135–145)
TCO2: 29 mmol/L (ref 22–32)
TCO2: 29 mmol/L (ref 22–32)
pCO2, Ven: 46 mmHg (ref 44–60)
pCO2, Ven: 44.2 mmHg (ref 44–60)
pH, Ven: 7.392 (ref 7.25–7.43)
pH, Ven: 7.406 (ref 7.25–7.43)
pO2, Ven: 30 mmHg — CL (ref 32–45)
pO2, Ven: 31 mmHg — CL (ref 32–45)

## 2024-03-21 LAB — GLUCOSE, CAPILLARY
Glucose-Capillary: 149 mg/dL — ABNORMAL HIGH (ref 70–99)
Glucose-Capillary: 108 mg/dL — ABNORMAL HIGH (ref 70–99)
Glucose-Capillary: 138 mg/dL — ABNORMAL HIGH (ref 70–99)

## 2024-03-21 LAB — CBC WITH DIFFERENTIAL/PLATELET
Abs Immature Granulocytes: 0.03 10*3/uL (ref 0.00–0.07)
Basophils Absolute: 0.1 10*3/uL (ref 0.0–0.1)
Basophils Relative: 1 %
Eosinophils Absolute: 0.2 10*3/uL (ref 0.0–0.5)
Eosinophils Relative: 3 %
HCT: 43.9 % (ref 39.0–52.0)
Hemoglobin: 14.3 g/dL (ref 13.0–17.0)
Immature Granulocytes: 1 %
Lymphocytes Relative: 20 %
Lymphs Abs: 1.2 10*3/uL (ref 0.7–4.0)
MCH: 29.7 pg (ref 26.0–34.0)
MCHC: 32.6 g/dL (ref 30.0–36.0)
MCV: 91.1 fL (ref 80.0–100.0)
Monocytes Absolute: 0.6 10*3/uL (ref 0.1–1.0)
Monocytes Relative: 10 %
Neutro Abs: 4 10*3/uL (ref 1.7–7.7)
Neutrophils Relative %: 65 %
Platelets: 168 10*3/uL (ref 150–400)
RBC: 4.82 MIL/uL (ref 4.22–5.81)
RDW: 15.1 % (ref 11.5–15.5)
WBC: 6.1 10*3/uL (ref 4.0–10.5)
nRBC: 0 % (ref 0.0–0.2)

## 2024-03-21 LAB — HEMOGLOBIN A1C
Hgb A1c MFr Bld: 7 % — ABNORMAL HIGH (ref 4.8–5.6)
Mean Plasma Glucose: 154.2 mg/dL

## 2024-03-21 LAB — BRAIN NATRIURETIC PEPTIDE: B Natriuretic Peptide: 946.3 pg/mL — ABNORMAL HIGH (ref 0.0–100.0)

## 2024-03-21 LAB — TSH: TSH: 1.933 u[IU]/mL (ref 0.350–4.500)

## 2024-03-21 LAB — MRSA NEXT GEN BY PCR, NASAL: MRSA by PCR Next Gen: NOT DETECTED

## 2024-03-21 MED ORDER — INSULIN ASPART 100 UNIT/ML IJ SOLN
0.0000 [IU] | Freq: Three times a day (TID) | INTRAMUSCULAR | Status: DC
  Administered 2024-03-24: 3 [IU] via SUBCUTANEOUS

## 2024-03-21 MED ORDER — FUROSEMIDE 10 MG/ML IJ SOLN
INTRAMUSCULAR | Status: AC
  Administered 2024-03-21: 80 mg via INTRAVENOUS
  Filled 2024-03-21: qty 8

## 2024-03-21 MED ORDER — INSULIN ASPART 100 UNIT/ML IJ SOLN: 0.0000 [IU] | Freq: Every day | INTRAMUSCULAR | Status: DC

## 2024-03-21 MED ORDER — ONDANSETRON HCL 4 MG/2ML IJ SOLN: 4.0000 mg | Freq: Four times a day (QID) | INTRAMUSCULAR | Status: DC | PRN

## 2024-03-21 MED ORDER — HEPARIN (PORCINE) IN NACL 1000-0.9 UT/500ML-% IV SOLN
INTRAVENOUS | Status: DC | PRN
  Administered 2024-03-21: 500 mL

## 2024-03-21 MED ORDER — LIDOCAINE HCL 1 % IJ SOLN
INTRAMUSCULAR | Status: AC
  Filled 2024-03-21: qty 20

## 2024-03-21 MED ORDER — SODIUM CHLORIDE 0.9% FLUSH: 3.0000 mL | INTRAVENOUS | Status: DC | PRN

## 2024-03-21 MED ORDER — DORZOLAMIDE HCL-TIMOLOL MAL 2-0.5 % OP SOLN
1.0000 [drp] | Freq: Two times a day (BID) | OPHTHALMIC | Status: DC
  Administered 2024-03-21 – 2024-03-24 (×5): 1 [drp] via OPHTHALMIC
  Filled 2024-03-21: qty 10

## 2024-03-21 MED ORDER — SODIUM CHLORIDE 0.9 % IV SOLN: 250.0000 mL | INTRAVENOUS | Status: AC | PRN

## 2024-03-21 MED ORDER — CO Q10 200 MG PO CAPS: 200.0000 mg | ORAL_CAPSULE | Freq: Every day | ORAL | Status: DC

## 2024-03-21 MED ORDER — SODIUM CHLORIDE 0.9% FLUSH
3.0000 mL | Freq: Two times a day (BID) | INTRAVENOUS | Status: DC
  Administered 2024-03-21 – 2024-03-23 (×4): 3 mL via INTRAVENOUS

## 2024-03-21 MED ORDER — SODIUM CHLORIDE 0.9% FLUSH
10.0000 mL | Freq: Two times a day (BID) | INTRAVENOUS | Status: DC
  Administered 2024-03-21: 20 mL
  Administered 2024-03-22 – 2024-03-23 (×2): 10 mL

## 2024-03-21 MED ORDER — SODIUM CHLORIDE 0.9 % IV SOLN: INTRAVENOUS | Status: DC

## 2024-03-21 MED ORDER — ACETAMINOPHEN 325 MG PO TABS: 650.0000 mg | ORAL_TABLET | ORAL | Status: DC | PRN

## 2024-03-21 MED ORDER — LATANOPROST 0.005 % OP SOLN
1.0000 [drp] | Freq: Every day | OPHTHALMIC | Status: DC
  Administered 2024-03-21 – 2024-03-23 (×3): 1 [drp] via OPHTHALMIC
  Filled 2024-03-21: qty 2.5

## 2024-03-21 MED ORDER — FUROSEMIDE 10 MG/ML IJ SOLN
80.0000 mg | Freq: Two times a day (BID) | INTRAMUSCULAR | Status: DC
  Administered 2024-03-21: 80 mg via INTRAVENOUS
  Filled 2024-03-21: qty 8

## 2024-03-21 MED ORDER — SPIRONOLACTONE 25 MG PO TABS
25.0000 mg | ORAL_TABLET | Freq: Every day | ORAL | Status: DC
  Administered 2024-03-21 – 2024-03-24 (×4): 25 mg via ORAL
  Filled 2024-03-21 (×4): qty 1

## 2024-03-21 MED ORDER — SODIUM CHLORIDE 0.9% FLUSH
3.0000 mL | Freq: Two times a day (BID) | INTRAVENOUS | Status: DC
Start: 2024-03-21 — End: 2024-03-24
  Administered 2024-03-21 – 2024-03-22 (×2): 3 mL via INTRAVENOUS

## 2024-03-21 MED ORDER — TAMSULOSIN HCL 0.4 MG PO CAPS
0.4000 mg | ORAL_CAPSULE | Freq: Every day | ORAL | Status: DC
  Administered 2024-03-21 – 2024-03-24 (×3): 0.4 mg via ORAL
  Filled 2024-03-21 (×3): qty 1

## 2024-03-21 MED ORDER — LIDOCAINE HCL (PF) 1 % IJ SOLN
INTRAMUSCULAR | Status: DC | PRN
  Administered 2024-03-21: 1 mL

## 2024-03-21 MED ORDER — CHLORHEXIDINE GLUCONATE CLOTH 2 % EX PADS
6.0000 | MEDICATED_PAD | Freq: Every day | CUTANEOUS | Status: DC
  Administered 2024-03-21 – 2024-03-24 (×4): 6 via TOPICAL

## 2024-03-21 MED ORDER — ATORVASTATIN CALCIUM 10 MG PO TABS
20.0000 mg | ORAL_TABLET | Freq: Every day | ORAL | Status: DC
  Administered 2024-03-21 – 2024-03-24 (×4): 20 mg via ORAL
  Filled 2024-03-21 (×4): qty 2

## 2024-03-21 MED ORDER — HYDRALAZINE HCL 20 MG/ML IJ SOLN: 10.0000 mg | INTRAMUSCULAR | Status: AC | PRN

## 2024-03-21 MED ORDER — LABETALOL HCL 5 MG/ML IV SOLN: 10.0000 mg | INTRAVENOUS | Status: AC | PRN

## 2024-03-21 MED ORDER — ASPIRIN 81 MG PO CHEW: 81.0000 mg | CHEWABLE_TABLET | ORAL | Status: DC

## 2024-03-21 MED ORDER — METOPROLOL SUCCINATE ER 50 MG PO TB24
50.0000 mg | ORAL_TABLET | Freq: Two times a day (BID) | ORAL | Status: DC
  Administered 2024-03-21 – 2024-03-24 (×6): 50 mg via ORAL
  Filled 2024-03-21 (×7): qty 1

## 2024-03-21 MED ORDER — EZETIMIBE 10 MG PO TABS
10.0000 mg | ORAL_TABLET | Freq: Every day | ORAL | Status: DC
  Administered 2024-03-21 – 2024-03-23 (×3): 10 mg via ORAL
  Filled 2024-03-21 (×4): qty 1

## 2024-03-21 MED ORDER — DIGOXIN 125 MCG PO TABS
0.0625 mg | ORAL_TABLET | Freq: Every day | ORAL | Status: DC
  Administered 2024-03-21 – 2024-03-23 (×3): 0.0625 mg via ORAL
  Filled 2024-03-21 (×3): qty 1

## 2024-03-21 MED ORDER — WARFARIN - PHARMACIST DOSING INPATIENT: Freq: Every day | Status: DC

## 2024-03-21 SURGICAL SUPPLY — 5 items
CATH BALLN WEDGE 5F 110CM (CATHETERS) IMPLANT
PACK CARDIAC CATHETERIZATION (CUSTOM PROCEDURE TRAY) ×1 IMPLANT
SHEATH GLIDE SLENDER 4/5FR (SHEATH) IMPLANT
TRANSDUCER W/STOPCOCK (MISCELLANEOUS) IMPLANT
TUBING ART PRESS 72 MALE/FEM (TUBING) IMPLANT

## 2024-03-21 NOTE — H&P (Signed)
 Advanced Heart Failure Team History and Physical Note   PCP:  Shelva Majestic, MD  PCP-Cardiology: None     Reason for Admission: CHF   HPI:    89 y.o. with history of extensive vascular disease including CAD s/p CABG and redo CABG, AAA s/p endovascular repair in 2015, PAD, and carotid stenosis as well as prior AVR.  He was admitted today after RHC for management of CHF.                             After his AAA repair in 2015, he was noted to be in atrial fibrillation.  He had had a prior episode of atrial fibrillation after his cardiac surgery in 2007.  Atrial fibrillation was rate-controlled.  He was started on warfarin and Toprol XL.  He developed exertional dyspnea and fatigue and was cardioverted back to SR 06/06/14.  Echo 06/15 EF worsened to 25-30% with stable bioprosthetic aortic valve, mildly dilated RV with normal systolic function.  LHC 07/15 showed stable anatomy.  The LIMA-LAD was patent, the sequential SVG-OM/ramus/D was patent only to ramus but this is the same as the prior cath, and the SVG-PDA was patent with a long 50-60% mid-graft stenosis.  No intervention.  Of note, at cath he was noted to be back in atrial fibrillation.  He was started on amiodarone and had DCCV again on 08/21/14.  This was successful.  He has been off amiodarone due to side effects (hallucinations).  Echo in 1/16 showed recovery of EF to 55-60%.     He was admitted in 1/19 with bronchitis/wheezing/dyspnea and was found to be in atrial fibrillation with RVR.  He tested positive for metapneumovirus.  Echo showed EF 45-50% with normal bioprosthetic aortic valve. He was diuresed for volume overload while in the hospital. He had DCCV to NSR.     Carotid dopplers in 1/19 showed 80-99% RICA stenosis, he had right CEA in 2/19 with no complications.    At appointment in early 6/20, Mr Bevis was noted to be back in atrial fibrillation with RVR and mild CHF.  He was started on Toprol XL and Lasix was increased.  He  had TEE-guided DCCV to NSR in 6/20.  TEE showed EF 50%, mild diffuse hypokinesis, mildly decreased RV systolic function, normally functioning bioprosthetic aortic valve.    In 2021, he was admitted for Tikosyn initiation.  In 12/21, he went into atrial fibrillation again and was cardioverted back to NSR.    Echo in 1/22 showed EF 45-50%, diffuse hypokinesis, mildly decreased RV systolic function, moderate biatrial enlargement, bioprosthetic valve with mean gradient 8 mmHg.     In 2/22, he went into atrial fibrillation and had TEE-guided DCCV back to NSR.  TEE showed EF 45-50%.     In 3/22, he was back in the ER with atrial fibrillation triggered by [name] most likely.  He had a cardioversion back to NSR.    In 10/22, patient developed urinary retention and an E coli UTI.  In the setting of this, he had a fall at home and was unable to get up due to weakness for hours.  He developed rhabdomyolysis.  This resolved with minimal renal damage with IV fluid hydration and he was discharged to rehab.    Follow up 2/23 he was in atrial fibrillation, TEE/DCCV arranged however INR subtherapeutic. He cancelled rescheduled procedure due to a death in the family.   Follow up  3/23. Remained in AF. Volume overloaded. Lasix increased for 3 days. Underwent TEE guided DCCV on 03/19/22.  TEE showed EF 50-55%, mildly decreased RV function, normal bioprosthetic aortic valve, mild MR and mild mitral stenosis (mean gradient 3 mmHg).     Diagnosed with stage IA2 NSCLC RUL in 2023. Saw Radiation Oncology and Pulmonary and decided on empiric treatment with SBRT.  He has completed radiation.    Admitted 9/23 with NSTEMI and AF with RVR. Underwent R/LHC showing normal filling pressures, known occlusion of native coronaries and SVG-RCA from original CABG. New occlusion of SVG-PDA from CABG#2. Long 99% stenosis in the SVG-ramus. Ramus is relatively small vessel. Patent LIMA-LAD with collaterals to RCA territory. Case was  discussed with Dr. Lynnette Caffey, wouldn't benefit from intervention, medically managed. Patient declined re-challenge of amiodarone and was able to have successful TEE/DCCV to NSR. TEE showed EF 45% with normal RV, mild-moderate MR, normal bioprosthetic aortic valve, no LA appendage thrombus. Discharged home, weight 184 lbs.   Follow up 9/23, back in atrial fibrillation with HR 116. Toprol increased to 25 bid and arranged for TEE/DCCV.  TEE/DCCV (10/23) showed EF 45%, diffuse HK, RV mildly decreased, moderate MR, Bioprosthetic AoV stable with mean gradient 6 mmHg, no LAA. Proceeded with successful DCCV to NSR.  However, later in 10/23 he was back in AF and has been in AF since that time.    Echo in 5/24 showed EF lower at 30-35%, moderate LV dilation, mild RV dysfunction, moderate-severe MR, bioprosthetic aortic valve appeared normal, IVC normal.    He has been feeling worse with increased exertional dyspnea for several months now.  Weight has gone up by about 10 lbs. He is short of breath just walking around his house.  +Orthopnea, sleeps on several pillows.  No chest pain.  No lightheadedness.  No palpitations. I saw him last week and gave him Furoscix x 2 days then increased his torsemide to 80 qam/60 qpm.  However, he says his symptoms are not any better.    RHC today: Hemodynamics RA mean 15 RV 53/11 PA 51/26, mean 40 PCWP mean 24 with prominent v waves to 35 Oxygen saturations: PA 58% AO 93% Cardiac Output (Fick) 4.2  Cardiac Index (Fick) 2.07 PVR 3.8 WU PAPi 1.7   Based on elevated filling pressures on RHC and ongoing symptoms, I admitted him today.   Review of Systems: All systems reviewed and negative except as per HPI.   Home Medications Prior to Admission medications   Medication Sig Start Date End Date Taking? Authorizing Provider  atorvastatin (LIPITOR) 10 MG tablet Take 10 mg by mouth every other day. Take with 20 mg every other day to equal 30 mg every other day   Yes  [provider]  atorvastatin (LIPITOR) 20 MG tablet Take 1 tablet (20 mg total) by mouth daily. Alternating with 30mg  Patient taking differently: Take 20 mg by mouth daily. 11/10/23  Yes Laurey Morale, MD  Coenzyme Q10 (CO Q10) 200 MG CAPS Take 200 mg by mouth daily.   Yes [provider]  dorzolamide-timolol (COSOPT) 22.3-6.8 MG/ML ophthalmic solution Place 1 drop into both eyes 2 (two) times daily.   Yes [provider]  ezetimibe (ZETIA) 10 MG tablet TAKE ONE TABLET BY MOUTH AT BEDTIME 02/07/24  Yes Shelva Majestic, MD  latanoprost (XALATAN) 0.005 % ophthalmic solution Place 1 drop into both eyes at bedtime.   Yes [provider]  METAMUCIL FIBER PO Take 1 Dose by mouth as  needed (constipation).   Yes [provider]  metoprolol succinate (TOPROL-XL) 50 MG 24 hr tablet Take 1 tablet (50 mg total) by mouth in the morning and at bedtime. Take with or immediately following a meal. 04/13/23  Yes Laurey Morale, MD  Misc Natural Products (GLUCOSAMINE CHONDROITIN TRIPLE) TABS Take 1 tablet by mouth daily.   Yes [provider]  potassium chloride SA (KLOR-CON M) 20 MEQ tablet Take 2 tablets (40 mEq total) by mouth 2 (two) times daily. Patient taking differently: Take 20 mEq by mouth 2 (two) times daily. 03/14/24  Yes Laurey Morale, MD  ramipril (ALTACE) 2.5 MG capsule Take 1 capsule (2.5 mg total) by mouth daily. 04/13/23  Yes Laurey Morale, MD  REPATHA SURECLICK 140 MG/ML SOAJ INJECT 1 PEN INTO SKIN EVERY 14 DAYS 02/08/24  Yes Laurey Morale, MD  spironolactone (ALDACTONE) 25 MG tablet TAKE ONE TABLET BY MOUTH EVERY DAY 08/13/23  Yes Laurey Morale, MD  tamsulosin (FLOMAX) 0.4 MG CAPS capsule TAKE ONE CAPSULE BY MOUTH TWICE DAILY 03/25/23  Yes Shelva Majestic, MD  torsemide (DEMADEX) 20 MG tablet Take 4 tablets (80 mg total) by mouth every morning AND 3 tablets (60 mg total) every evening. Patient taking differently: Take 3 tablets (60  mg total) by mouth every morning AND 2 tablets (40 mg total) every evening. 03/14/24  Yes Laurey Morale, MD  warfarin (COUMADIN) 3 MG tablet TAKE TWO TO THREE TABLETS DAILY OR AS DIRECTED BY COUMADIN CLINIC Patient taking differently: Take 9 mg by mouth daily. 02/07/24  Yes Camnitz, Will Daphine Deutscher, MD  Furosemide (FUROSCIX) 80 MG/10ML CTKT Inject 80 mg into the skin as directed. Patient not taking: Reported on 03/17/2024 03/14/24   Laurey Morale, MD  sildenafil (VIAGRA) 25 MG tablet TAKE ONE TABLET ONCE A DAY AS NEEDED 07/22/23   Shelva Majestic, MD    Past Medical History: Past Medical History:  Diagnosis Date   AAA (abdominal aortic aneurysm) [facility])    Atrial fibrillation (HCC)    Atrial fibrillation (HCC)    CAD (coronary artery disease)    Cataract    CHF (congestive heart failure) (HCC)    Chronic kidney disease    Colon polyps    adenomatous   Diabetes mellitus    Diverticulosis    Dysrhythmia    A-Fib   GERD (gastroesophageal reflux disease)    Gout    Hernia, abdominal    History of aortic valve replacement with bioprosthetic valve    2007   Hx: UTI (urinary tract infection)    Hyperlipemia    Hypertension    Internal hemorrhoids    Peripheral vascular disease (HCC)    Shortness of breath    with exertion    Past Surgical History: Past Surgical History:  Procedure Laterality Date   ABDOMINAL AORTIC ENDOVASCULAR STENT GRAFT N/A 04/13/2014   Procedure: ABDOMINAL AORTIC ENDOVASCULAR STENT GRAFT;  Surgeon: Nada Libman, MD;  Location: [facility] OR;  Service: Vascular;  Laterality: N/A;   ANGIOPLASTY     Unsure if he had stents put in    APPENDECTOMY     CARDIAC VALVE SURGERY     CARDIOVERSION N/A 06/06/2014   Procedure: CARDIOVERSION;  Surgeon: Laurey Morale, MD;  Location: [facility] ENDOSCOPY;  Service: Cardiovascular;  Laterality: N/A;   CARDIOVERSION N/A 08/21/2014   Procedure: CARDIOVERSION;  Surgeon: Laurey Morale, MD;  Location: [facility] ENDOSCOPY;  Service: Cardiovascular;   Laterality: N/A;   CARDIOVERSION  N/A 12/22/2017   Procedure: CARDIOVERSION;  Surgeon: Laurey Morale, MD;  Location: [facility] ENDOSCOPY;  Service: Cardiovascular;  Laterality: N/A;   CARDIOVERSION N/A 05/25/2019   Procedure: CARDIOVERSION;  Surgeon: Laurey Morale, MD;  Location: [facility] ENDOSCOPY;  Service: Cardiovascular;  Laterality: N/A;   CARDIOVERSION N/A 06/14/2019   Procedure: CARDIOVERSION;  Surgeon: Jake Bathe, MD;  Location: [facility] ENDOSCOPY;  Service: Cardiovascular;  Laterality: N/A;   CARDIOVERSION N/A 12/12/2020   Procedure: CARDIOVERSION;  Surgeon: Laurey Morale, MD;  Location: [facility] ENDOSCOPY;  Service: Cardiovascular;  Laterality: N/A;   CARDIOVERSION N/A 01/29/2021   Procedure: CARDIOVERSION;  Surgeon: Laurey Morale, MD;  Location: [facility] ENDOSCOPY;  Service: Cardiovascular;  Laterality: N/A;   CARDIOVERSION N/A 03/19/2022   Procedure: CARDIOVERSION;  Surgeon: Laurey Morale, MD;  Location: [facility] ENDOSCOPY;  Service: Cardiovascular;  Laterality: N/A;   CARDIOVERSION N/A 08/24/2022   Procedure: CARDIOVERSION;  Surgeon: Laurey Morale, MD;  Location: [facility] ENDOSCOPY;  Service: Cardiovascular;  Laterality: N/A;   CARDIOVERSION N/A 09/23/2022   Procedure: CARDIOVERSION;  Surgeon: Laurey Morale, MD;  Location: [facility] ENDOSCOPY;  Service: Cardiovascular;  Laterality: N/A;   CORONARY ARTERY BYPASS GRAFT     ENDARTERECTOMY Right 02/02/2018   Procedure: ENDARTERECTOMY CAROTID RIGHT;  Surgeon: Nada Libman, MD;  Location: [facility] OR;  Service: Vascular;  Laterality: Right;   EYE SURGERY Bilateral    cataract surgery   HERNIA REPAIR     2 operations   LEFT HEART CATHETERIZATION WITH CORONARY/GRAFT ANGIOGRAM N/A 07/10/2014   Procedure: LEFT HEART CATHETERIZATION WITH Isabel Caprice;  Surgeon: Laurey Morale, MD;  Location: [facility] CATH LAB;  Service: Cardiovascular;  Laterality: N/A;   RIGHT HEART CATH AND CORONARY/GRAFT ANGIOGRAPHY N/A 08/21/2022   Procedure: RIGHT HEART CATH AND CORONARY/GRAFT  ANGIOGRAPHY;  Surgeon: Laurey Morale, MD;  Location: [facility] INVASIVE CV LAB;  Service: Cardiovascular;  Laterality: N/A;   TEE WITHOUT CARDIOVERSION N/A 05/25/2019   Procedure: TRANSESOPHAGEAL ECHOCARDIOGRAM (TEE);  Surgeon: Laurey Morale, MD;  Location: [facility] ENDOSCOPY;  Service: Cardiovascular;  Laterality: N/A;   TEE WITHOUT CARDIOVERSION N/A 01/29/2021   Procedure: TRANSESOPHAGEAL ECHOCARDIOGRAM (TEE);  Surgeon: Laurey Morale, MD;  Location: [facility] ENDOSCOPY;  Service: Cardiovascular;  Laterality: N/A;   TEE WITHOUT CARDIOVERSION N/A 03/19/2022   Procedure: TRANSESOPHAGEAL ECHOCARDIOGRAM (TEE);  Surgeon: Laurey Morale, MD;  Location: [facility] ENDOSCOPY;  Service: Cardiovascular;  Laterality: N/A;   TEE WITHOUT CARDIOVERSION N/A 08/24/2022   Procedure: TRANSESOPHAGEAL ECHOCARDIOGRAM (TEE);  Surgeon: Laurey Morale, MD;  Location: [facility] ENDOSCOPY;  Service: Cardiovascular;  Laterality: N/A;   TEE WITHOUT CARDIOVERSION N/A 09/23/2022   Procedure: TRANSESOPHAGEAL ECHOCARDIOGRAM (TEE);  Surgeon: Laurey Morale, MD;  Location: [facility] ENDOSCOPY;  Service: Cardiovascular;  Laterality: N/A;   TONSILLECTOMY     VIDEO BRONCHOSCOPY Bilateral 12/25/2015   Procedure: VIDEO BRONCHOSCOPY WITHOUT FLUORO;  Surgeon: Roslynn Amble, MD;  Location: [facility] ENDOSCOPY;  Service: Cardiopulmonary;  Laterality: Bilateral;    Family History:  Family History  Problem Relation Age of Onset   Heart disease Mother    Heart disease Father        before age 3   AAA (abdominal aortic aneurysm) Father    Melanoma Sister        with mets. died 76   Colon cancer Neg Hx     Social History: Social History   Socioeconomic History   Marital status: Widowed    Spouse name: Not on file   Number of children: Not  on file   Years of education: Not on file   Highest education level: Not on file  Occupational History   Occupation: [employer]   Tobacco Use   Smoking status: Former    Current packs/day: 0.00    Average packs/day:  0.3 packs/day for 20.0 years (5.0 ttl pk-yrs)    Types: Cigarettes    Start date: 03/23/1972    Quit date: 03/23/1992    Years since quitting: 32.0    Passive exposure: Past   Smokeless tobacco: Never   Tobacco comments:    Former smoker 09/16/22  Vaping Use   Vaping status: Never Used  Substance and Sexual Activity   Alcohol use: Yes    Alcohol/week: 3.0 standard drinks of alcohol    Types: 3 Glasses of wine per week   Drug use: No   Sexual activity: Not Currently  Other Topics Concern   Not on file  Social History Narrative   Widowed (lives alone at [place])- wife died at 67 (cancer at 64). Daughter 90, son 12 in 2023. 5 grandkids. 5 greatgrandkids.    -when patient were 75- started caring for 3 of his grandkids when daughter was not in a good place in the airforce.       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       Hobbies: golf, enjoys football and basketball at [employer]: Low Risk  (07/06/2023)   Overall Financial Resource Strain (CARDIA)    Difficulty of Paying Living Expenses: Not hard at all  Food Insecurity: No Food Insecurity (07/06/2023)   Hunger Vital Sign    Worried About Running Out of Food in the Last Year: Never true    Ran Out of Food in the Last Year: Never true  Transportation Needs: No Transportation Needs (07/06/2023)   PRAPARE - Administrator, Civil Service (Medical): No    Lack of Transportation (Non-Medical): No  Physical Activity: Sufficiently Active (07/06/2023)   Exercise Vital Sign    Days of Exercise per Week: 7 days    Minutes of Exercise per Session: 40 min  Stress: No Stress Concern Present (07/06/2023)   [employer] of Occupational Health - Occupational Stress Questionnaire    Feeling of Stress : Not at all  Social Connections: Moderately Isolated (07/06/2023)   Social Connection and Isolation Panel [NHANES]    Frequency of Communication with Friends and Family:  Twice a week    Frequency of Social Gatherings with Friends and Family: More than three times a week    Attends Religious Services: More than 4 times per year    Active Member of [employer] or Organizations: No    Attends Banker Meetings: Never    Marital Status: Widowed    Allergies:  Allergies  Allergen Reactions   Barbiturates     Queasy and tired   Grass Extracts [Gramineae Pollens] Other (See Comments)    Sneezing and runny nose   Milk-Related Compounds Other (See Comments)    Just Yogurt.  Makes him very lethargic.   Amiodarone Anxiety    depression    Objective:    Vital Signs:   Temp:  [97.6 F (36.4 C)] 97.6 F (36.4 C) (04/08 0604) Pulse Rate:  [76-108] 94 (04/08 0945) Resp:  [14-25] 15 (04/08 0945) BP: (100-122)/(65-94) 106/65 (04/08 0945) SpO2:  [89 %-96 %] 94 % (04/08 0945) Weight:  [82.6 kg] 82.6 kg (04/08 0604)  Filed Weights   03/21/24 0604  Weight: 82.6 kg     Physical Exam     General:  Well appearing. No respiratory difficulty HEENT: Normal Neck: Supple. JVP 14. Carotids 2+ bilat; no bruits. No lymphadenopathy or thyromegaly appreciated. Cor: PMI nondisplaced. Irregular rate & rhythm. No rubs, gallops. 2/6 HSM apex.  Lungs: Mild crackles at bases.  Abdomen: Soft, nontender, nondistended. No hepatosplenomegaly. No bruits or masses. Good bowel sounds. Extremities: No cyanosis, clubbing, rash. 1+ edema to knees bilaterally.  Neuro: Alert & oriented x 3, cranial nerves grossly intact. moves all 4 extremities w/o difficulty. Affect pleasant.   Telemetry   Atrial fibrillation 80s, personally reviewed)   Labs     Basic Metabolic Panel: Recent Labs  Lab 03/14/24 1537  NA 135  K 4.2  CL 99  CO2 28  GLUCOSE 121*  BUN 24*  CREATININE 1.20  CALCIUM 9.1    Liver Function Tests: No results for input(s): "AST", "ALT", "ALKPHOS", "BILITOT", "PROT", "ALBUMIN" in the last 168 hours. No results for input(s): "LIPASE", "AMYLASE" in  the last 168 hours. No results for input(s): "AMMONIA" in the last 168 hours.  CBC: Recent Labs  Lab 03/14/24 1537  WBC 5.7  HGB 13.5  HCT 41.6  MCV 90.6  PLT 151    Cardiac Enzymes: No results for input(s): "CKTOTAL", "CKMB", "CKMBINDEX", "TROPONINI" in the last 168 hours.  BNP: BNP (last 3 results) Recent Labs    05/12/23 0856 06/23/23 0904 03/14/24 1537  BNP 398.9* 340.7* 1,091.5*    ProBNP (last 3 results) Recent Labs    11/01/23 1241  PROBNP 5,110*     CBG: Recent Labs  Lab 03/21/24 0600  GLUCAP 149*    Coagulation Studies: Recent Labs    03/20/24 1501 03/21/24 0611  LABPROT  --  35.9*  INR 2.8 3.6*    Imaging: CARDIAC CATHETERIZATION Result Date: 03/21/2024 1. Elevated right and left heart filling pressures. 2. Low CI and PAPi. 3. Moderate mixed pulmonary arterial/pulmonary venous hypertension. 4. Prominent V-waves in PCWP tracing, ?significant MR. I will admit for diuresis and investigation of MR.   [place] EKG SITE RITE Result Date: 03/21/2024 If Site Rite image not attached, placement could not be confirmed due to current cardiac rhythm.     Assessment/Plan   1.  Acute on Chronic Systolic Heart Failure: Ischemic cardiomyopathy.  Echo 9/23 with stable EF 45-50%, LV global hypokinesis, RV systolic function normal. Filling pressures normal on RHC (9/23). TEE in 10/23 showed EF 45%, diffuse HK, RV mildly decreased, moderate MR, bioprosthetic AoV stable with mean gradient 6 mmHg, no LAA. Echo 5/24 EF 30-35%, RV mildly reduced, MR moderate-severe. Suspect further drop in EF may be tachycardia-mediated with permanent AF.  NYHA class IIIb symptoms.  RHC today showed significantly increased left and right heart filling pressures despite augmented diuretic regimen at home. Cardiac index and PAPi marginal, concern for significant mitral regurgitation (see below).   - Place PICC to follow CVP and co-ox.  - Lasix 80 mg IV bid and follow response.  - Hold  ramipril, hopefully can start Entresto.  - Continue Toprol XL 50 mg bid for now, needed for rate control.  - Continue spironolactone 25 mg daily. - Off Jardiance with h/o E coli UTI, may be able to restart in future.  - Needs repeat echo, assess mitral regurgitation.  2.  CAD: s/p CABG and Redo CABG. LHC 06/2014 showed significant coronary disease w/ no good interventional targets (LIMA-LAD patent with 40-50% stenosis  in LAD after touchdown, sequential SVG-ramus/OM/diagonal with only the ramus branch still intact (known from prior cath), SVG-PDA from original surgery TO, SVG-PDA from redo surgery with long 50-60% mid-graft stenosis). NSTEMI 9/23, repeat LHC showed occlusion of the 2nd SVG-PDA and 99% stenosis in the SVG-ramus.  Probably not much benefit to intervention on SVG-ramus.  Small territory covered and complex disease in the SVG.  No chest pain, doubt ACS.  - Now off ASA with warfarin use. - Continue atorvastatin 20 mg daily + Zetia.  - Continue Repatha at home.   3. Atrial fibrillation: Now permanent.  Seen by EP, not thought to be good ablation candidate.  - Continue warfarin. INR high, will allow down-trend.   - Continue Toprol XL 50 mg bid  4. Bioprosthetic AVR: Normal function on 5/24 echo.  5. CKD stage 3: Follow closely with diuresis.  6. Type 2DM:  - off SLGT2i given UTIs.  - Will give SSI.  7. PAD:  AAA s/p EVR, LE PAD + Carotid Artery Disease s/p Rt CEA. No current claudication symptoms.  - Followed by Dr. Myra Gianotti. - Continue lipid lowering agents per above.  8. Mitral regurgitation: Moderate-severe on 5/24 echo.  RHC today with prominent v-waves in PCWP tracing.  I am concerned that severe MR may be contributing to his symptomatic worsening.  - Echo today.  - If MR is severe, consider evaluation for Mitraclip/TEE.    [name], MD 03/21/2024, 11:43 AM  Advanced Heart Failure Team Pager [phone] (M-F; 7a - 5p)  Please contact [facility] for night-coverage after  hours (4p -7a ) and weekends on [email or web address]

## 2024-03-21 NOTE — Progress Notes (Signed)
 Heart Failure Navigator Progress Note  Assessed for Heart & Vascular TOC clinic readiness.  Patient does not meet criteria due to AHF patient of Dr. Shirlee Latch.   Navigator will sign off.   Sharen Hones, PharmD, BCPS Heart Failure Stewardship Pharmacist Phone [phone]

## 2024-03-21 NOTE — Anesthesia Preprocedure Evaluation (Signed)
 Anesthesia Evaluation  Patient identified by MRN, date of birth, ID band Patient awake    Reviewed: Allergy & Precautions, NPO status , Patient's Chart, lab work & pertinent test results  History of Anesthesia Complications Negative for: history of anesthetic complications  Airway Mallampati: I  TM Distance: >3 FB Neck ROM: Full   Comment: Previous grade I view with MAC 4, easy mask Dental  (+) Dental Advisory Given   Pulmonary neg shortness of breath, neg sleep apnea, neg COPD, neg recent URI, former smoker RUL cancer   Pulmonary exam normal breath sounds clear to auscultation       Cardiovascular hypertension (metoprolol), Pt. on home beta blockers and Pt. on medications (-) angina + CAD, + Past MI, + CABG, + Peripheral Vascular Disease and +CHF (EF 30-35%)  + dysrhythmias Atrial Fibrillation + Valvular Problems/Murmurs (s/p AVR in 2007) MR  Rhythm:Regular Rate:Normal  AAA s/p stent graft 04/13/2014, HLD, carotid artery disease s/p endarterectomy  RHC 03/21/2024: 1. Elevated right and left heart filling pressures.  2. Low CI and PAPi.  3. Moderate mixed pulmonary arterial/pulmonary venous hypertension.  4. Prominent V-waves in PCWP tracing, ?significant MR.  TTE 04/29/2023: IMPRESSIONS    1. Global hypokinesis worse in septum and apex. Left ventricular ejection  fraction, by estimation, is 30 to 35%. The left ventricle has moderately  decreased function. The left ventricle has no regional wall motion  abnormalities. The left ventricular  internal cavity size was moderately dilated. Left ventricular diastolic  parameters are indeterminate.   2. Right ventricular systolic function is mildly reduced. The right  ventricular size is mildly enlarged. There is normal pulmonary artery  systolic pressure.   3. Left atrial size was moderately dilated.   4. Right atrial size was mildly dilated.   5. Restricted posterior leaflet motion  Valve not ideal for clip. The  mitral valve is abnormal. Moderate to severe mitral valve regurgitation.  No evidence of mitral stenosis. Severe mitral annular calcification.   6. Bioprosthetic AVR no PVL and normal gradients. The aortic valve has  been repaired/replaced. Aortic valve regurgitation is not visualized. No  aortic stenosis is present. There is a bioprosthetic valve present in the  aortic position. Procedure Date:  2007.   7. Aortic dilatation noted. There is mild dilatation of the ascending  aorta, measuring 38 mm.   8. The inferior vena cava is normal in size with greater than 50%  respiratory variability, suggesting right atrial pressure of 3 mmHg.      Neuro/Psych neg Seizures    GI/Hepatic Neg liver ROS,GERD  ,,Diverticulosis    Endo/Other  diabetes, Type 2    Renal/GU CRFRenal disease     Musculoskeletal   Abdominal   Peds  Hematology negative hematology ROS (+) Lab Results      Component                Value               Date                      WBC                      6.1                 03/21/2024                HGB  14.3                03/21/2024                HCT                      43.9                03/21/2024                MCV                      91.1                03/21/2024                PLT                      168                 03/21/2024               Anesthesia Other Findings K 4.1  Reproductive/Obstetrics                             Anesthesia Physical Anesthesia Plan  ASA: 4  Anesthesia Plan: MAC   Post-op Pain Management:    Induction: Intravenous  PONV Risk Score and Plan: 1 and Propofol infusion, TIVA and Treatment may vary due to age or medical condition  Airway Management Planned: Natural Airway and Simple Face Mask  Additional Equipment:   Intra-op Plan:   Post-operative Plan:   Informed Consent: I have reviewed the patients History and Physical, chart,  labs and discussed the procedure including the risks, benefits and alternatives for the proposed anesthesia with the patient or authorized representative who has indicated his/her understanding and acceptance.     Dental advisory given  Plan Discussed with: CRNA and Anesthesiologist  Anesthesia Plan Comments: (Discussed with patient risks of MAC including, but not limited to, minor pain or discomfort, hearing people in the room, and possible need for backup general anesthesia. Risks for general anesthesia also discussed including, but not limited to, sore throat, hoarse voice, chipped/damaged teeth, injury to vocal cords, nausea and vomiting, allergic reactions, lung infection, heart attack, stroke, and death. All questions answered. )       Anesthesia Quick Evaluation

## 2024-03-21 NOTE — Progress Notes (Signed)
 Peripherally Inserted Central Catheter Placement  The IV Nurse has discussed with the patient and/or persons authorized to consent for the patient, the purpose of this procedure and the potential benefits and risks involved with this procedure.  The benefits include less needle sticks, lab draws from the catheter, and the patient may be discharged home with the catheter. Risks include, but not limited to, infection, bleeding, blood clot (thrombus formation), and puncture of an artery; nerve damage and irregular heartbeat and possibility to perform a PICC exchange if needed/ordered by physician.  Alternatives to this procedure were also discussed.  Bard Power PICC patient education guide, fact sheet on infection prevention and patient information card has been provided to patient /or left at bedside.    PICC Placement Documentation  PICC Double Lumen 03/21/24 Right Basilic 42 cm 0 cm (Active)  Indication for Insertion or Continuance of Line Vasoactive infusions 03/21/24 1648  Exposed Catheter (cm) 0 cm 03/21/24 1648  Site Assessment Clean, Dry, Intact 03/21/24 1648  Lumen #1 Status Flushed;Saline locked;Blood return noted 03/21/24 1648  Lumen #2 Status Flushed;Saline locked;Blood return noted 03/21/24 1648  Dressing Type Transparent;Securing device 03/21/24 1648  Dressing Status Antimicrobial disc/dressing in place;Clean, Dry, Intact 03/21/24 1648  Line Care Connections checked and tightened 03/21/24 1648  Line Adjustment (NICU/IV Team Only) No 03/21/24 1648  Dressing Intervention New dressing;Adhesive placed at insertion site (IV team only) 03/21/24 1648  Dressing Change Due 03/28/24 03/21/24 1648    Telephone consent signed by Ronnie Derby Albarece 03/21/2024, 4:51 PM

## 2024-03-21 NOTE — Progress Notes (Signed)
 Noted chest xray results. Secure chat sent to nurse that it is okay to use PICC.

## 2024-03-21 NOTE — Plan of Care (Signed)
  Problem: Cardiovascular: Goal: Vascular access site(s) Level 0-1 will be maintained Outcome: Progressing   Problem: Nutritional: Goal: Maintenance of adequate nutrition will improve Outcome: Progressing   Problem: Education: Goal: Knowledge of General Education information will improve Description: Including pain rating scale, medication(s)/side effects and non-pharmacologic comfort measures Outcome: Progressing   Problem: Pain Managment: Goal: General experience of comfort will improve and/or be controlled Outcome: Progressing

## 2024-03-21 NOTE — Interval H&P Note (Signed)
 History and Physical Interval Note:  03/21/2024 8:00 AM  Nathaniel Silk [name]  has presented today for surgery, with the diagnosis of heart failure.  The various methods of treatment have been discussed with the patient and family. After consideration of risks, benefits and other options for treatment, the patient has consented to  Procedure(s): RIGHT HEART CATH (N/A) as a surgical intervention.  The patient's history has been reviewed, patient examined, no change in status, stable for surgery.  I have reviewed the patient's chart and labs.  Questions were answered to the patient's satisfaction.     Glorimar Stroope [employer]

## 2024-03-21 NOTE — Progress Notes (Signed)
 PHARMACY - ANTICOAGULATION CONSULT NOTE  Pharmacy Consult for Warfarin Indication: atrial fibrillation  Allergies  Allergen Reactions   Barbiturates     Queasy and tired   Grass Extracts [Gramineae Pollens] Other (See Comments)    Sneezing and runny nose   Milk-Related Compounds Other (See Comments)    Just Yogurt.  Makes him very lethargic.   Amiodarone Anxiety    depression    Patient Measurements: Height: 5\' 10"  (177.8 cm) Weight: 77.2 kg (170 lb 3.2 oz) IBW/kg (Calculated) : 73 HEPARIN DW (KG): 77.2  Vital Signs: Temp: 97.6 F (36.4 C) (04/08 0604) BP: 99/58 (04/08 1518) Pulse Rate: 80 (04/08 1518)  Labs: Recent Labs    03/20/24 1501 03/21/24 0611  LABPROT  --  35.9*  INR 2.8 3.6*    Estimated Creatinine Clearance: 43.1 mL/min (by C-G formula based on SCr of 1.2 mg/dL).   Medical History: Past Medical History:  Diagnosis Date   AAA (abdominal aortic aneurysm) (HCC)    Atrial fibrillation (HCC)    Atrial fibrillation (HCC)    CAD (coronary artery disease)    Cataract    CHF (congestive heart failure) (HCC)    Chronic kidney disease    Colon polyps    adenomatous   Diabetes mellitus    Diverticulosis    Dysrhythmia    A-Fib   GERD (gastroesophageal reflux disease)    Gout    Hernia, abdominal    History of aortic valve replacement with bioprosthetic valve    2007   Hx: UTI (urinary tract infection)    Hyperlipemia    Hypertension    Internal hemorrhoids    Peripheral vascular disease (HCC)    Shortness of breath    with exertion      Assessment: 89yom admitted with HF and volume overload started on iv furosemide. On Warfarin 9mg  wd PTA with INR 3.6 > goal on admit  No bleeding noted   Goal of Therapy:  INR 2-3 Monitor platelets by anticoagulation protocol: Yes   Plan:  No warfarin today  Daily protime   Leota Sauers Pharm.D. CPP, BCPS Clinical Pharmacist [phone] 03/21/2024 3:42 PM

## 2024-03-22 ENCOUNTER — Inpatient Hospital Stay (HOSPITAL_COMMUNITY)

## 2024-03-22 ENCOUNTER — Inpatient Hospital Stay (HOSPITAL_COMMUNITY): Payer: Self-pay | Admitting: Anesthesiology

## 2024-03-22 ENCOUNTER — Encounter (HOSPITAL_COMMUNITY)
Admission: RE | Disposition: A | Discharge status: Home / Self Care | Payer: Self-pay | Source: Home / Self Care | Attending: Cardiology

## 2024-03-22 DIAGNOSIS — L8990 Pressure ulcer of unspecified site, unspecified stage: Secondary | ICD-10-CM | POA: Insufficient documentation

## 2024-03-22 DIAGNOSIS — I2510 Atherosclerotic heart disease of native coronary artery without angina pectoris: Secondary | ICD-10-CM | POA: Diagnosis not present

## 2024-03-22 DIAGNOSIS — I340 Nonrheumatic mitral (valve) insufficiency: Secondary | ICD-10-CM | POA: Diagnosis not present

## 2024-03-22 DIAGNOSIS — Z87891 Personal history of nicotine dependence: Secondary | ICD-10-CM | POA: Diagnosis not present

## 2024-03-22 DIAGNOSIS — I5023 Acute on chronic systolic (congestive) heart failure: Secondary | ICD-10-CM | POA: Diagnosis not present

## 2024-03-22 DIAGNOSIS — I10 Essential (primary) hypertension: Secondary | ICD-10-CM

## 2024-03-22 HISTORY — PX: TRANSESOPHAGEAL ECHOCARDIOGRAM (CATH LAB): EP1270

## 2024-03-22 LAB — BASIC METABOLIC PANEL WITH GFR
Anion gap: 11 (ref 5–15)
BUN: 32 mg/dL — ABNORMAL HIGH (ref 8–23)
CO2: 29 mmol/L (ref 22–32)
Calcium: 9 mg/dL (ref 8.9–10.3)
Chloride: 96 mmol/L — ABNORMAL LOW (ref 98–111)
Creatinine, Ser: 1.35 mg/dL — ABNORMAL HIGH (ref 0.61–1.24)
GFR, Estimated: 50 mL/min — ABNORMAL LOW (ref >60)
Glucose, Bld: 132 mg/dL — ABNORMAL HIGH (ref 70–99)
Potassium: 4.1 mmol/L (ref 3.5–5.1)
Sodium: 136 mmol/L (ref 135–145)

## 2024-03-22 LAB — COOXEMETRY PANEL
Carboxyhemoglobin: 1.8 % — ABNORMAL HIGH (ref 0.5–1.5)
Methemoglobin: <0.7 % (ref 0.0–1.5)
O2 Saturation: 61.8 %
Total hemoglobin: 14 g/dL (ref 12.0–16.0)

## 2024-03-22 LAB — PROTIME-INR
INR: 3.7 — ABNORMAL HIGH (ref 0.8–1.2)
Prothrombin Time: 37 s — ABNORMAL HIGH (ref 11.4–15.2)

## 2024-03-22 LAB — GLUCOSE, CAPILLARY
Glucose-Capillary: 117 mg/dL — ABNORMAL HIGH (ref 70–99)
Glucose-Capillary: 96 mg/dL (ref 70–99)
Glucose-Capillary: 177 mg/dL — ABNORMAL HIGH (ref 70–99)
Glucose-Capillary: 180 mg/dL — ABNORMAL HIGH (ref 70–99)

## 2024-03-22 LAB — ECHO TEE
AV Mean grad: 6 mmHg
AV Peak grad: 9.9 mmHg
Ao pk vel: 1.57 m/s
Est EF: 35

## 2024-03-22 MED ORDER — LIDOCAINE 2% (20 MG/ML) 5 ML SYRINGE
INTRAMUSCULAR | Status: DC | PRN
  Administered 2024-03-22: 50 mg via INTRAVENOUS

## 2024-03-22 MED ORDER — METOLAZONE 2.5 MG PO TABS
2.5000 mg | ORAL_TABLET | Freq: Once | ORAL | Status: AC
  Administered 2024-03-22: 2.5 mg via ORAL
  Filled 2024-03-22: qty 1

## 2024-03-22 MED ORDER — GERHARDT'S BUTT CREAM
1.0000 | TOPICAL_CREAM | Freq: Two times a day (BID) | CUTANEOUS | Status: DC
Start: 2024-03-22 — End: 2024-03-24
  Administered 2024-03-23 – 2024-03-24 (×3): 1 via TOPICAL
  Filled 2024-03-22: qty 60

## 2024-03-22 MED ORDER — FUROSEMIDE 10 MG/ML IJ SOLN
12.0000 mg/h | INTRAVENOUS | Status: DC
  Administered 2024-03-22 (×2): 12 mg/h via INTRAVENOUS
  Filled 2024-03-22 (×3): qty 20

## 2024-03-22 MED ORDER — POTASSIUM CHLORIDE CRYS ER 20 MEQ PO TBCR
40.0000 meq | EXTENDED_RELEASE_TABLET | Freq: Once | ORAL | Status: AC
  Administered 2024-03-22: 40 meq via ORAL
  Filled 2024-03-22: qty 2

## 2024-03-22 MED ORDER — MEDIHONEY WOUND/BURN DRESSING EX PSTE
1.0000 | PASTE | Freq: Every day | CUTANEOUS | Status: DC
  Administered 2024-03-23 – 2024-03-24 (×3): 1 via TOPICAL
  Filled 2024-03-22: qty 44

## 2024-03-22 MED ORDER — FUROSEMIDE 10 MG/ML IJ SOLN
80.0000 mg | Freq: Once | INTRAMUSCULAR | Status: AC
  Administered 2024-03-22: 80 mg via INTRAVENOUS
  Filled 2024-03-22: qty 8

## 2024-03-22 MED ORDER — PHENYLEPHRINE HCL (PRESSORS) 10 MG/ML IV SOLN
INTRAVENOUS | Status: DC | PRN
  Administered 2024-03-22: 80 ug via INTRAVENOUS
  Administered 2024-03-22 (×2): 160 ug via INTRAVENOUS

## 2024-03-22 MED ORDER — PROPOFOL 10 MG/ML IV BOLUS
INTRAVENOUS | Status: DC | PRN
Start: 2024-03-22 — End: 2024-03-22
  Administered 2024-03-22: 150 ug/kg/min via INTRAVENOUS

## 2024-03-22 NOTE — Progress Notes (Signed)
  Echocardiogram Echocardiogram Transesophageal has been performed.  Delcie Roch 03/22/2024, 10:46 AM

## 2024-03-22 NOTE — CV Procedure (Signed)
 Procedure: TEE  Indication: Mitral regurgitation  Sedation: Per anesthesiology  Findings: Please see echo section for full report.   Mildly dilated LV with LV EF 35%.  Mildly dilated RV with mildly decreased systolic function.  Moderate left atrial enlargement, no LA appendage thrombus.  Mild right atrial enlargement.  No ASD or PFO by color doppler.  Mild TR.  Bioprosthetic aortic valve with no significant stenosis or regurgitation.  The posterior mitral leaflet was calcified and restricted.  There were 2 jets of mitral regurgitation, PISA ERO 0.21 cm^2 when applied to each jet, so total PISA ERO 0.42 cm^2.  MV mean gradient 2 mmHg.  There was flattening but not definite flow reversal in the pulmonary vein systolic doppler pattern.   Severe mitral regurgitation.  Will discuss Mitraclip with structural team.   [name] 03/22/2024 10:31 AM

## 2024-03-22 NOTE — Progress Notes (Signed)
 PHARMACY - ANTICOAGULATION CONSULT NOTE  Pharmacy Consult for Warfarin Indication: atrial fibrillation  Allergies  Allergen Reactions   Barbiturates     Queasy and tired   Grass Extracts [Gramineae Pollens] Other (See Comments)    Sneezing and runny nose   Milk-Related Compounds Other (See Comments)    Just Yogurt.  Makes him very lethargic.   Amiodarone Anxiety    depression    Patient Measurements: Height: 5\' 10"  (177.8 cm) Weight: 76.9 kg (169 lb 8.5 oz) IBW/kg (Calculated) : 73 HEPARIN DW (KG): 77.2  Vital Signs: Temp: 97.8 F (36.6 C) (04/09 1130) Temp Source: Oral (04/09 1130) BP: 97/62 (04/09 1130) Pulse Rate: 77 (04/09 1131)  Labs: Recent Labs    03/20/24 1501 03/21/24 0611 03/21/24 0814 03/21/24 1539 03/22/24 0507  HGB  --   --  13.3  13.6 14.3  --   HCT  --   --  39.0  40.0 43.9  --   PLT  --   --   --  168  --   LABPROT  --  35.9*  --   --  37.0*  INR 2.8 3.6*  --   --  3.7*  CREATININE  --   --   --  1.51* 1.35*    Estimated Creatinine Clearance: 38.3 mL/min (A) (by C-G formula based on SCr of 1.35 mg/dL (H)).   Medical History: Past Medical History:  Diagnosis Date   AAA (abdominal aortic aneurysm) (HCC)    Atrial fibrillation (HCC)    Atrial fibrillation (HCC)    CAD (coronary artery disease)    Cataract    CHF (congestive heart failure) (HCC)    Chronic kidney disease    Colon polyps    adenomatous   Diabetes mellitus    Diverticulosis    Dysrhythmia    A-Fib   GERD (gastroesophageal reflux disease)    Gout    Hernia, abdominal    History of aortic valve replacement with bioprosthetic valve    2007   Hx: UTI (urinary tract infection)    Hyperlipemia    Hypertension    Internal hemorrhoids    Peripheral vascular disease (HCC)    Shortness of breath    with exertion      Assessment: 89yom admitted with HF and volume overload started on iv furosemide. On Warfarin 9mg  wd PTA   No bleeding noted   INR still elevated at  3.6 today.  Goal of Therapy:  INR 2-3 Monitor platelets by anticoagulation protocol: Yes   Plan:  Continue holding warfarin today. Will resume once INR lower.  Reece Leader, Loura Back, BCPS, BCCP Clinical Pharmacist  03/22/2024 1:23 PM   [facility] pharmacy phone numbers are listed on [email or web address]

## 2024-03-22 NOTE — Transfer of Care (Signed)
 Immediate Anesthesia Transfer of Care Note  Patient: Nathaniel [name].  Procedure(s) Performed: TRANSESOPHAGEAL ECHOCARDIOGRAM  Patient Location: Cath Lab  Anesthesia Type:MAC  Level of Consciousness: drowsy  Airway & Oxygen Therapy: Patient Spontanous Breathing  Post-op Assessment: Report given to RN and Post -op Vital signs reviewed and stable  Post vital signs: Reviewed and stable  Last Vitals:  Vitals Value Taken Time  BP    Temp 36.1 C 03/22/24 1035  Pulse 79 03/22/24 1036  Resp 18 03/22/24 1036  SpO2 94 % 03/22/24 1036  Vitals shown include unfiled device data.  Last Pain:  Vitals:   03/22/24 1035  TempSrc: Tympanic  PainSc: Asleep      Patients Stated Pain Goal: 0 (03/21/24 0604)  Complications: No notable events documented.

## 2024-03-22 NOTE — H&P (View-Only) (Signed)
 Patient ID: Nathaniel [name]., male   DOB: 12-24-1931, 89 y.o.   MRN: [id number]     Advanced Heart Failure Rounding Note  Cardiologist: None  Chief Complaint: CHF Subjective:    Weight down 1 lb, I/Os not fully recorded.  Co-ox 62% this morning with CVP 15-15 on my read.   No complaints.   RHC Procedural Findings (mmHg): Hemodynamics RA mean 15 RV 53/11 PA 51/26, mean 40 PCWP mean 24 with prominent v waves to 35 Oxygen saturations: PA 58% AO 93% Cardiac Output (Fick) 4.2  Cardiac Index (Fick) 2.07 PVR 3.8 WU PAPi 1.7    Objective:   Weight Range: 76.9 kg Body mass index is 24.33 kg/m.   Vital Signs:   Temp:  [97.6 F (36.4 C)-98.6 F (37 C)] 97.8 F (36.6 C) (04/09 0726) Pulse Rate:  [71-94] 78 (04/09 0726) Resp:  [14-25] 19 (04/09 0726) BP: (90-116)/(52-99) 90/52 (04/09 0726) SpO2:  [90 %-95 %] 93 % (04/09 0726) Weight:  [76.9 kg-77.2 kg] 76.9 kg (04/09 0429)    Weight change: Filed Weights   03/21/24 0604 03/21/24 1518 03/22/24 0429  Weight: 82.6 kg 77.2 kg 76.9 kg    Intake/Output:   Intake/Output Summary (Last 24 hours) at 03/22/2024 0825 Last data filed at 03/22/2024 0459 Gross per 24 hour  Intake 307.21 ml  Output 850 ml  Net -542.79 ml      Physical Exam    General:  Well appearing. No resp difficulty HEENT: Normal Neck: Supple. JVP 14. Carotids 2+ bilat; no bruits. No lymphadenopathy or thyromegaly appreciated. Cor: PMI nondisplaced. Irregular rate & rhythm. 3/6 HSM apex.  Lungs: Clear Abdomen: Soft, nontender, nondistended. No hepatosplenomegaly. No bruits or masses. Good bowel sounds. Extremities: No cyanosis, clubbing, rash. 2+ edema to knees Neuro: Alert & orientedx3, cranial nerves grossly intact. moves all 4 extremities w/o difficulty. Affect pleasant   Telemetry   Atrial fibrillation rate 80s (personally reviewed)  Labs    CBC Recent Labs    03/21/24 0814 03/21/24 1539  WBC  --  6.1  NEUTROABS  --  4.0  HGB 13.3  13.6  14.3  HCT 39.0  40.0 43.9  MCV  --  91.1  PLT  --  168   Basic Metabolic Panel Recent Labs    78/29/56 1539 03/22/24 0507  NA 135 136  K 5.6* 4.1  CL 99 96*  CO2 25 29  GLUCOSE 133* 132*  BUN 33* 32*  CREATININE 1.51* 1.35*  CALCIUM 9.1 9.0  MG 2.1  --    Liver Function Tests Recent Labs    03/21/24 1539  AST 29  ALT 10  ALKPHOS 82  BILITOT 2.1*  PROT 7.1  ALBUMIN 3.7   No results for input(s): "LIPASE", "AMYLASE" in the last 72 hours. Cardiac Enzymes No results for input(s): "CKTOTAL", "CKMB", "CKMBINDEX", "TROPONINI" in the last 72 hours.  BNP: BNP (last 3 results) Recent Labs    06/23/23 0904 03/14/24 1537 03/21/24 1539  BNP 340.7* 1,091.5* 946.3*    ProBNP (last 3 results) Recent Labs    11/01/23 1241  PROBNP 5,110*     D-Dimer No results for input(s): "DDIMER" in the last 72 hours. Hemoglobin A1C Recent Labs    03/21/24 1539  HGBA1C 7.0*   Fasting Lipid Panel No results for input(s): "CHOL", "HDL", "LDLCALC", "TRIG", "CHOLHDL", "LDLDIRECT" in the last 72 hours. Thyroid Function Tests Recent Labs    03/21/24 1539  TSH 1.933    Other results:  Imaging    DG Chest Port 1 View Result Date: 03/21/2024 CLINICAL DATA:  [id number] S/P PICC central line placement [id number] EXAM: PORTABLE CHEST 1 VIEW COMPARISON:  Chest x-ray 08/20/2022. CT chest 11/24/2022 FINDINGS: Right PICC with tip overlying the superior cavoatrial junction. The heart and mediastinal contours are unchanged. Atherosclerotic plaque. Surgical changes overlie the mediastinum. Redemonstration of right upper lobe consolidation that is better evaluated on CT chest 11/24/2022. Chronic coarsened interstitial markings with no definite pulmonary edema. Persistent blunting of the left costophrenic angle. No pleural effusion. No pneumothorax. No acute osseous abnormality.  Intact sternotomy wires. IMPRESSION: 1. Right PICC in appropriate position. 2. Question bronchitic changes. 3.  Aortic  Atherosclerosis (ICD10-I70.0). Electronically Signed   By: Tish Frederickson M.D.   On: 03/21/2024 19:49   [place] EKG SITE RITE Result Date: 03/21/2024 If Site Rite image not attached, placement could not be confirmed due to current cardiac rhythm.    Medications:     Scheduled Medications:  atorvastatin  20 mg Oral Daily   Chlorhexidine Gluconate Cloth  6 each Topical Daily   digoxin  0.0625 mg Oral Daily   dorzolamide-timolol  1 drop Both Eyes BID   ezetimibe  10 mg Oral QHS   furosemide  80 mg Intravenous Once   Gerhardt's butt cream  1 Application Topical BID   insulin aspart  0-15 Units Subcutaneous TID WC   insulin aspart  0-5 Units Subcutaneous QHS   latanoprost  1 drop Both Eyes QHS   leptospermum manuka honey  1 Application Topical Daily   metolazone  2.5 mg Oral Once   metoprolol succinate  50 mg Oral BID   potassium chloride  40 mEq Oral Once   sodium chloride flush  10-40 mL Intracatheter Q12H   sodium chloride flush  3 mL Intravenous Q12H   sodium chloride flush  3 mL Intravenous Q12H   spironolactone  25 mg Oral Daily   tamsulosin  0.4 mg Oral Daily   Warfarin - Pharmacist Dosing Inpatient   Does not apply q1600    Infusions:  sodium chloride     sodium chloride     sodium chloride Stopped (03/22/24 0439)   furosemide (LASIX) 200 mg in dextrose 5 % 100 mL (2 mg/mL) infusion      PRN Medications: sodium chloride, sodium chloride, acetaminophen, ondansetron (ZOFRAN) IV, sodium chloride flush, sodium chloride flush    Assessment/Plan   1.  Acute on Chronic Systolic Heart Failure: Ischemic cardiomyopathy.  Echo 9/23 with stable EF 45-50%, LV global hypokinesis, RV systolic function normal. Filling pressures normal on RHC (9/23). TEE in 10/23 showed EF 45%, diffuse HK, RV mildly decreased, moderate MR, bioprosthetic AoV stable with mean gradient 6 mmHg, no LAA. Echo 5/24 EF 30-35%, RV mildly reduced, MR moderate-severe. Suspect further drop in EF may be  tachycardia-mediated with permanent AF.  NYHA class IIIb symptoms.  RHC showed significantly increased left and right heart filling pressures despite augmented diuretic regimen at home. Cardiac index and PAPi marginal, concern for significant mitral regurgitation (see below).  This morning, CVP 15-16 with co-ox 62%. Volume overloaded on exam.  - Lasix 80 mg IV x 1 then start gtt at 12 mg/hr.  Will give 1 dose metolazone 2.5.  - Hold ramipril with soft BP.   - Continue Toprol XL 50 mg bid for now, needed for rate control.  - Continue spironolactone 25 mg daily. - Off Jardiance with h/o E coli UTI, may be able to restart in future.  -  Needs repeat echo, assess mitral regurgitation.  - Unna boots 2.  CAD: s/p CABG and Redo CABG. LHC 06/2014 showed significant coronary disease w/ no good interventional targets (LIMA-LAD patent with 40-50% stenosis in LAD after touchdown, sequential SVG-ramus/OM/diagonal with only the ramus branch still intact (known from prior cath), SVG-PDA from original surgery TO, SVG-PDA from redo surgery with long 50-60% mid-graft stenosis). NSTEMI 9/23, repeat LHC showed occlusion of the 2nd SVG-PDA and 99% stenosis in the SVG-ramus.  Probably not much benefit to intervention on SVG-ramus.  Small territory covered and complex disease in the SVG.  No chest pain, doubt ACS.  - Now off ASA with warfarin use. - Continue atorvastatin 20 mg daily + Zetia.  - Continue Repatha at home.   3. Atrial fibrillation: Now permanent.  Seen by EP in past, not thought to be good ablation candidate. Failed Tikosyn and amiodarone.  - Continue warfarin. INR high, will allow down-trend.   - Continue Toprol XL 50 mg bid  4. Bioprosthetic AVR: Normal function on 5/24 echo.  5. CKD stage 3: Follow closely with diuresis.  6. Type 2DM:  - off SLGT2i given UTIs.  - Will give SSI.  7. PAD:  AAA s/p EVR, LE PAD + Carotid Artery Disease s/p Rt CEA. No current claudication symptoms.  - Followed by Dr.  Myra Gianotti. - Continue lipid lowering agents per above.  8. Mitral regurgitation: Moderate-severe on 5/24 echo.  RHC today with prominent v-waves in PCWP tracing.  I am concerned that severe MR may be contributing to his symptomatic worsening.  - TEE today to assess MR, discussed risks/benefits with patient and he agrees to procedure.     Length of Stay: 1  [name], MD  03/22/2024, 8:25 AM  Advanced Heart Failure Team Pager [phone] (M-F; 7a - 5p)  Please contact [facility] for night-coverage after hours (5p -7a ) and weekends on [email or web address]

## 2024-03-22 NOTE — TOC Initial Note (Signed)
 Transition of Care (TOC) - Initial/Assessment Note    Patient Details  Name: Nathaniel [name]. MRN: [id number] Date of Birth: 07-20-1932  Transition of Care [facility]) CM/SW Contact:    Nicanor Bake Phone Number: [phone] 03/22/2024, 11:09 AM  Clinical Narrative:   10:12 AM- HF CSW attempted to meet with patient at bedside. Patient was being seen by medical staff. CSW will follow up at a more appropriate time.   TOC will continue following.                       Patient Goals and CMS Choice            Expected Discharge Plan and Services                                              Prior Living Arrangements/Services                       Activities of Daily Living   ADL Screening (condition at time of admission) Independently performs ADLs?: Yes (appropriate for developmental age) Is the patient deaf or have difficulty hearing?: Yes Does the patient have difficulty seeing, even when wearing glasses/contacts?: No Does the patient have difficulty concentrating, remembering, or making decisions?: No  Permission Sought/Granted                  Emotional Assessment              Admission diagnosis:  Acute on chronic systolic CHF (congestive heart failure) (HCC) [I50.23] Patient Active Problem List   Diagnosis Date Noted   Nonrheumatic mitral valve regurgitation 03/22/2024   Pressure injury of skin 03/22/2024   Acute on chronic systolic CHF (congestive heart failure) (HCC) 03/21/2024   NSTEMI (non-ST elevated myocardial infarction) (HCC) 08/20/2022   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) 04/01/2022   Aortic atherosclerosis (HCC) 02/19/2022   Secondary hypercoagulable state (HCC) 02/19/2022   Controlled type 2 diabetes mellitus with complication, without long-term current use of insulin (HCC) 02/19/2022   Chronic gout without tophus 02/19/2022   BPH (benign prostatic hyperplasia) 10/11/2021   Rhabdomyolysis  10/10/2021   Afib (HCC) 06/12/2019   Asymptomatic carotid artery stenosis without infarction, right 02/02/2018   Solitary pulmonary nodule 03/23/2016   Pulmonary nodule 12/25/2015   Aspiration of foreign body    Foreign body aspiration 12/23/2015   Encounter for therapeutic drug monitoring 03/28/2015   Chronic combined systolic and diastolic heart failure (HCC) 06/12/2014   AAA (abdominal aortic aneurysm) without rupture (HCC) 04/13/2014   Occlusion and stenosis of carotid artery without mention of cerebral infarction 04/09/2014   CAD (coronary artery disease) of artery bypass graft 09/14/2012   Abdominal aortic aneurysm (HCC) 09/14/2012   Peripheral vascular disease with claudication (HCC) 09/14/2012   Carotid artery disease without cerebral infarction (HCC) 09/14/2012   History of aortic valve replacement with bioprosthetic valve 09/14/2012   Essential hypertension 09/14/2012   Hyperlipidemia with target LDL less than 70 09/14/2012   PCP:  Shelva Majestic, MD Pharmacy:   [facility] - [place], [place] - [address] [place] [address] Phone: [phone] Fax: [phone]     Social Drivers of Health (SDOH) Social History: SDOH Screenings   Food Insecurity: No Food Insecurity (  03/21/2024)  Housing: Low Risk  (03/21/2024)  Transportation Needs: No Transportation Needs (03/21/2024)  Utilities: Not At Risk (03/21/2024)  Depression (PHQ2-9): Low Risk  (07/06/2023)  Financial Resource Strain: Low Risk  (07/06/2023)  Physical Activity: Sufficiently Active (07/06/2023)  Social Connections: Moderately Integrated (03/21/2024)  Stress: No Stress Concern Present (07/06/2023)  Tobacco Use: Medium Risk (03/21/2024)  Health Literacy: Adequate Health Literacy (07/06/2023)   SDOH Interventions:     Readmission Risk Interventions     No data to display

## 2024-03-22 NOTE — Progress Notes (Addendum)
 Patient ID: Nathaniel [name]., male   DOB: 12-31-1931, 89 y.o.   MRN: [id number]     Advanced Heart Failure Rounding Note  Cardiologist: None  Chief Complaint: CHF Subjective:    Weight down 1 lb, I/Os not fully recorded.  Co-ox 62% this morning with CVP 15-16 on my read.   No complaints.   RHC Procedural Findings (mmHg): Hemodynamics RA mean 15 RV 53/11 PA 51/26, mean 40 PCWP mean 24 with prominent v waves to 35 Oxygen saturations: PA 58% AO 93% Cardiac Output (Fick) 4.2  Cardiac Index (Fick) 2.07 PVR 3.8 WU PAPi 1.7    Objective:   Weight Range: 76.9 kg Body mass index is 24.33 kg/m.   Vital Signs:   Temp:  [97.6 F (36.4 C)-98.6 F (37 C)] 97.8 F (36.6 C) (04/09 0726) Pulse Rate:  [71-94] 78 (04/09 0726) Resp:  [14-25] 19 (04/09 0726) BP: (90-116)/(52-99) 90/52 (04/09 0726) SpO2:  [90 %-95 %] 93 % (04/09 0726) Weight:  [76.9 kg-77.2 kg] 76.9 kg (04/09 0429)    Weight change: Filed Weights   03/21/24 0604 03/21/24 1518 03/22/24 0429  Weight: 82.6 kg 77.2 kg 76.9 kg    Intake/Output:   Intake/Output Summary (Last 24 hours) at 03/22/2024 0825 Last data filed at 03/22/2024 0459 Gross per 24 hour  Intake 307.21 ml  Output 850 ml  Net -542.79 ml      Physical Exam    General:  Well appearing. No resp difficulty HEENT: Normal Neck: Supple. JVP 14. Carotids 2+ bilat; no bruits. No lymphadenopathy or thyromegaly appreciated. Cor: PMI nondisplaced. Irregular rate & rhythm. 3/6 HSM apex.  Lungs: Clear Abdomen: Soft, nontender, nondistended. No hepatosplenomegaly. No bruits or masses. Good bowel sounds. Extremities: No cyanosis, clubbing, rash. 2+ edema to knees Neuro: Alert & orientedx3, cranial nerves grossly intact. moves all 4 extremities w/o difficulty. Affect pleasant   Telemetry   Atrial fibrillation rate 80s (personally reviewed)  Labs    CBC Recent Labs    03/21/24 0814 03/21/24 1539  WBC  --  6.1  NEUTROABS  --  4.0  HGB 13.3  13.6  14.3  HCT 39.0  40.0 43.9  MCV  --  91.1  PLT  --  168   Basic Metabolic Panel Recent Labs    82/95/62 1539 03/22/24 0507  NA 135 136  K 5.6* 4.1  CL 99 96*  CO2 25 29  GLUCOSE 133* 132*  BUN 33* 32*  CREATININE 1.51* 1.35*  CALCIUM 9.1 9.0  MG 2.1  --    Liver Function Tests Recent Labs    03/21/24 1539  AST 29  ALT 10  ALKPHOS 82  BILITOT 2.1*  PROT 7.1  ALBUMIN 3.7   No results for input(s): "LIPASE", "AMYLASE" in the last 72 hours. Cardiac Enzymes No results for input(s): "CKTOTAL", "CKMB", "CKMBINDEX", "TROPONINI" in the last 72 hours.  BNP: BNP (last 3 results) Recent Labs    06/23/23 0904 03/14/24 1537 03/21/24 1539  BNP 340.7* 1,091.5* 946.3*    ProBNP (last 3 results) Recent Labs    11/01/23 1241  PROBNP 5,110*     D-Dimer No results for input(s): "DDIMER" in the last 72 hours. Hemoglobin A1C Recent Labs    03/21/24 1539  HGBA1C 7.0*   Fasting Lipid Panel No results for input(s): "CHOL", "HDL", "LDLCALC", "TRIG", "CHOLHDL", "LDLDIRECT" in the last 72 hours. Thyroid Function Tests Recent Labs    03/21/24 1539  TSH 1.933    Other results:  Imaging    DG Chest Port 1 View Result Date: 03/21/2024 CLINICAL DATA:  [id number] S/P PICC central line placement [id number] EXAM: PORTABLE CHEST 1 VIEW COMPARISON:  Chest x-ray 08/20/2022. CT chest 11/24/2022 FINDINGS: Right PICC with tip overlying the superior cavoatrial junction. The heart and mediastinal contours are unchanged. Atherosclerotic plaque. Surgical changes overlie the mediastinum. Redemonstration of right upper lobe consolidation that is better evaluated on CT chest 11/24/2022. Chronic coarsened interstitial markings with no definite pulmonary edema. Persistent blunting of the left costophrenic angle. No pleural effusion. No pneumothorax. No acute osseous abnormality.  Intact sternotomy wires. IMPRESSION: 1. Right PICC in appropriate position. 2. Question bronchitic changes. 3.  Aortic  Atherosclerosis (ICD10-I70.0). Electronically Signed   By: Tish Frederickson M.D.   On: 03/21/2024 19:49   [place] EKG SITE RITE Result Date: 03/21/2024 If Site Rite image not attached, placement could not be confirmed due to current cardiac rhythm.    Medications:     Scheduled Medications:  atorvastatin  20 mg Oral Daily   Chlorhexidine Gluconate Cloth  6 each Topical Daily   digoxin  0.0625 mg Oral Daily   dorzolamide-timolol  1 drop Both Eyes BID   ezetimibe  10 mg Oral QHS   furosemide  80 mg Intravenous Once   Gerhardt's butt cream  1 Application Topical BID   insulin aspart  0-15 Units Subcutaneous TID WC   insulin aspart  0-5 Units Subcutaneous QHS   latanoprost  1 drop Both Eyes QHS   leptospermum manuka honey  1 Application Topical Daily   metolazone  2.5 mg Oral Once   metoprolol succinate  50 mg Oral BID   potassium chloride  40 mEq Oral Once   sodium chloride flush  10-40 mL Intracatheter Q12H   sodium chloride flush  3 mL Intravenous Q12H   sodium chloride flush  3 mL Intravenous Q12H   spironolactone  25 mg Oral Daily   tamsulosin  0.4 mg Oral Daily   Warfarin - Pharmacist Dosing Inpatient   Does not apply q1600    Infusions:  sodium chloride     sodium chloride     sodium chloride Stopped (03/22/24 0439)   furosemide (LASIX) 200 mg in dextrose 5 % 100 mL (2 mg/mL) infusion      PRN Medications: sodium chloride, sodium chloride, acetaminophen, ondansetron (ZOFRAN) IV, sodium chloride flush, sodium chloride flush    Assessment/Plan   1.  Acute on Chronic Systolic Heart Failure: Ischemic cardiomyopathy.  Echo 9/23 with stable EF 45-50%, LV global hypokinesis, RV systolic function normal. Filling pressures normal on RHC (9/23). TEE in 10/23 showed EF 45%, diffuse HK, RV mildly decreased, moderate MR, bioprosthetic AoV stable with mean gradient 6 mmHg, no LAA. Echo 5/24 EF 30-35%, RV mildly reduced, MR moderate-severe. Suspect further drop in EF may be  tachycardia-mediated with permanent AF.  NYHA class IIIb symptoms.  RHC showed significantly increased left and right heart filling pressures despite augmented diuretic regimen at home. Cardiac index and PAPi marginal, concern for significant mitral regurgitation (see below).  This morning, CVP 15-16 with co-ox 62%. Volume overloaded on exam.  - Lasix 80 mg IV x 1 then start gtt at 12 mg/hr.  Will give 1 dose metolazone 2.5.  - Hold ramipril with soft BP.   - Continue Toprol XL 50 mg bid for now, needed for rate control.  - Continue spironolactone 25 mg daily. - Off Jardiance with h/o E coli UTI, may be able to restart in future.  -  Needs repeat echo, assess mitral regurgitation.  - Unna boots 2.  CAD: s/p CABG and Redo CABG. LHC 06/2014 showed significant coronary disease w/ no good interventional targets (LIMA-LAD patent with 40-50% stenosis in LAD after touchdown, sequential SVG-ramus/OM/diagonal with only the ramus branch still intact (known from prior cath), SVG-PDA from original surgery TO, SVG-PDA from redo surgery with long 50-60% mid-graft stenosis). NSTEMI 9/23, repeat LHC showed occlusion of the 2nd SVG-PDA and 99% stenosis in the SVG-ramus.  Probably not much benefit to intervention on SVG-ramus.  Small territory covered and complex disease in the SVG.  No chest pain, doubt ACS.  - Now off ASA with warfarin use. - Continue atorvastatin 20 mg daily + Zetia.  - Continue Repatha at home.   3. Atrial fibrillation: Now permanent.  Seen by EP in past, not thought to be good ablation candidate. Failed Tikosyn and amiodarone.  - Continue warfarin. INR high, will allow down-trend.   - Continue Toprol XL 50 mg bid  4. Bioprosthetic AVR: Normal function on 5/24 echo.  5. CKD stage 3: Follow closely with diuresis.  6. Type 2DM:  - off SLGT2i given UTIs.  - Will give SSI.  7. PAD:  AAA s/p EVR, LE PAD + Carotid Artery Disease s/p Rt CEA. No current claudication symptoms.  - Followed by Dr.  Myra Gianotti. - Continue lipid lowering agents per above.  8. Mitral regurgitation: Moderate-severe on 5/24 echo.  RHC today with prominent v-waves in PCWP tracing.  I am concerned that severe MR may be contributing to his symptomatic worsening.  - TEE today to assess MR, discussed risks/benefits with patient and he agrees to procedure.     Length of Stay: 1  [name], MD  03/22/2024, 8:25 AM  Advanced Heart Failure Team Pager [phone] (M-F; 7a - 5p)  Please contact [facility] for night-coverage after hours (5p -7a ) and weekends on [email or web address]

## 2024-03-22 NOTE — Evaluation (Signed)
 Physical Therapy Evaluation Patient Details Name: Nathaniel [name]. MRN: [id number] DOB: [birth year] Today's Date: 03/22/2024  History of Present Illness  89 y.o. male admitted 03/21/24 for planned RHC and management of CHF. PMH includes vascular disease including CAD s/p CABG and redo CABG, AAA s/p endovascular repair in 2015, PAD, and carotid stenosis as well as prior AVR.   Clinical Impression  Pt admitted with above. PTA pt living in indep apartment at [employer]. Pt reports using SPC in apt, driving but not recently, indep with dressing and bathing but doesn't shower but 1x/wk. Granddaughters bring food as he was tired of food at [employer]. At this time pt presenting with generalized weakness, deconditioning, +SOB with activity with SpO2 at 88% on RA, and requires RW and minA for safe ambulation. At this time pt unsafe to be home alone however pt reports "I'd rather go to jail then to the assisted living part." Acute PT to cont to follow to address above deficits. Recommend inpatient rehab program > 3 hrs a day however aware pt will decline so pt will need HHPT, rolling walker, and family support for ADLs initially.         If plan is discharge home, recommend the following: A little help with walking and/or transfers;A little help with bathing/dressing/bathroom;Assist for transportation   Can travel by private vehicle        Equipment Recommendations Rolling walker (2 wheels)  Recommendations for Other Services       Functional Status Assessment Patient has had a recent decline in their functional status and demonstrates the ability to make significant improvements in function in a reasonable and predictable amount of time.     Precautions / Restrictions Precautions Precautions: Fall Restrictions Weight Bearing Restrictions Per Provider Order: No      Mobility  Bed Mobility Overal bed mobility: Needs Assistance Bed Mobility: Supine to Sit     Supine to sit: Min assist      General bed mobility comments: HOB slightly elevated, with increased time pt able to bring LEs off EOB. minA for trunk elevation and to scoot to EOB    Transfers Overall transfer level: Needs assistance Equipment used: Rolling walker (2 wheels) Transfers: Sit to/from Stand Sit to Stand: Min assist           General transfer comment: minA to power up, verbal cues to push up from bed, increased time, posterior bias    Ambulation/Gait Ambulation/Gait assistance: Min assist Gait Distance (Feet): 12 Feet (x2, to/from bathroom) Assistive device: Rolling walker (2 wheels) Gait Pattern/deviations: Step-through pattern, Decreased stride length, Trunk flexed Gait velocity: dec     General Gait Details: short shuffled steps, minA for walker management, verbal cues to stay in walker especially during turning  Stairs            Wheelchair Mobility     Tilt Bed    Modified Rankin (Stroke Patients Only)       Balance Overall balance assessment: Needs assistance Sitting-balance support: Feet supported, No upper extremity supported Sitting balance-Leahy Scale: Fair     Standing balance support: Bilateral upper extremity supported, During functional activity, Reliant on assistive device for balance Standing balance-Leahy Scale: Poor Standing balance comment: reliant on RW                             Pertinent Vitals/Pain Pain Assessment Pain Assessment: No/denies pain    Home Living Family/patient expects to be  discharged to::  ([facility]F at [facility])                   Additional Comments: own apartment with elevator, tub shower, higher toliet    Prior Function Prior Level of Function : Needs assist             Mobility Comments: was driving until recently, uses SPC in home, limits community, still trades stock. Pt's granddaughters have brought groceries and take him to appts ADLs Comments: reports doing own dressing and bathing however only  showers about 1x/week     Extremity/Trunk Assessment   Upper Extremity Assessment Upper Extremity Assessment: Defer to OT evaluation    Lower Extremity Assessment Lower Extremity Assessment: Generalized weakness    Cervical / Trunk Assessment Cervical / Trunk Assessment: Kyphotic  Communication   Communication Communication: Impaired Factors Affecting Communication: Hearing impaired    Cognition Arousal: Alert Behavior During Therapy: WFL for tasks assessed/performed   PT - Cognitive impairments: No apparent impairments                       PT - Cognition Comments: pt tangential, easily distracted, aware he benefits from a RW more so a cane Following commands: Intact       Cueing Cueing Techniques: Verbal cues     General Comments General comments (skin integrity, edema, etc.): VSS, assisted to bathroom to urinate    Exercises     Assessment/Plan    PT Assessment Patient needs continued PT services  PT Problem List Decreased strength;Decreased activity tolerance;Decreased balance;Decreased mobility       PT Treatment Interventions DME instruction;Gait training;Stair training;Functional mobility training;Therapeutic activities;Therapeutic exercise    PT Goals (Current goals can be found in the Care Plan section)  Acute Rehab PT Goals Patient Stated Goal: home PT Goal Formulation: With patient Time For Goal Achievement: 04/05/24 Potential to Achieve Goals: Good    Frequency Min 2X/week     Co-evaluation               AM-PAC PT "6 Clicks" Mobility  Outcome Measure Help needed turning from your back to your side while in a flat bed without using bedrails?: A Little Help needed moving from lying on your back to sitting on the side of a flat bed without using bedrails?: A Little Help needed moving to and from a bed to a chair (including a wheelchair)?: A Little Help needed standing up from a chair using your arms (e.g., wheelchair or  bedside chair)?: A Little Help needed to walk in hospital room?: A Little Help needed climbing 3-5 steps with a railing? : A Lot 6 Click Score: 17    End of Session Equipment Utilized During Treatment: Gait belt Activity Tolerance: Patient limited by fatigue Patient left: in chair;with call bell/phone within reach;with chair alarm set Nurse Communication: Mobility status PT Visit Diagnosis: Unsteadiness on feet (R26.81);Muscle weakness (generalized) (M62.81)    Time: 0454-0981 PT Time Calculation (min) (ACUTE ONLY): 33 min   Charges:   PT Evaluation $PT Eval Moderate Complexity: 1 Mod PT Treatments $Gait Training: 8-22 mins PT General Charges $$ ACUTE PT VISIT: 1 Visit         Lewis Shock, PT, DPT Acute Rehabilitation Services Secure chat preferred Office #: [phone]   Iona Hansen 03/22/2024, 10:12 AM

## 2024-03-22 NOTE — Consult Note (Incomplete)
 [facility]   MULTIDISCIPLINARY HEART VALVE TEAM  Inpatient MitraClip Consultation:   Patient ID: Nathaniel [name].; [id number]; July 22, 1932   Admit date: 03/21/2024 Date of Consult: 03/22/2024  Primary Care Provider: Shelva Majestic, MD Primary Cardiologist: Dr. Shirlee Latch Primary Electrophysiologist:  none   Patient Profile:   Nathaniel Silk [place]. is a 89 y.o. male with a hx of CAD s/p CABG (1989) and redo CABG +AVR (2207), AAA s/p endovascular repair in 2015, PAD, carotid stenosis s/p R CEA (2019), Stage IA2 NSCLC of RUL s/p SBRT, HFrEF with multiple admissions, persistent atrial fibrillation who is being seen today for the evaluation of severe MR at the request of Dr. Shirlee Latch.  History of Present Illness:   Mr. [place] ***  CABG 1989 with LIMA-LAD, SVG-D, seq SVG-ramus and OM, sequential SVG-PDA/PLV. SVG-PDA/PLV found to be occluded on cath prior to AVR, so patient had SVG-PDA with AVR in 2007.   After his AAA repair, he was noted to be in atrial fibrillation. Echo 06/15 EF worsened to 25-30% with stable bioprosthetic aortic valve, mildly dilated RV with normal systolic function.  LHC 07/15 showed stable anatomy.  The LIMA-LAD was patent, the sequential SVG-OM/ramus/D was patent only to ramus but this is the same as the prior cath, and the SVG-PDA was patent with a long 50-60% mid-graft stenosis.  No intervention. Echo in 1/16 showed recovery of EF to 55-60%.     He has been off amiodarone due to side effects (hallucinations).  Has had many cardioversions in the past.     Recently diagnosed with stage IA2 NSCLC RUL. Saw Radiation Oncology and Pulmonary and decided on empiric treatment with SBRT.  He has completed radiation.    Admitted 9/23 with NSTEMI and AF with RVR. Underwent R/LHC showing normal filling pressures, known occlusion of native coronaries and SVG-RCA from original CABG. New occlusion of SVG-PDA from CABG#2. Long 99% stenosis in the SVG-ramus. Ramus is relatively  small vessel. Patent LIMA-LAD with collaterals to RCA territory. Case was discussed with Dr. Lynnette Caffey, wouldn't benefit from intervention, medically managed. Patient declined re-challenge of amiodarone and was able to have successful TEE/DCCV to NSR. TEE showed EF 45% with normal RV, mild-moderate MR, normal bioprosthetic aortic valve, no LA appendage thrombus. Discharged home, weight 184 lbs.   Follow up 9/23, back in atrial fibrillation with HR 116. Toprol increased to 25 bid and arranged for TEE/DCCV.   TEE/DCCV (10/23) showed EF 45%, diffuse HK, RV mildly decreased, moderate MR, Bioprosthetic AoV stable with mean gradient 6 mmHg, no LAA. Proceeded with successful DCCV to NSR.  However, later in 10/23 he was back in AF and has been in AF since that time.    Echo in 5/24 showed EF lower at 30-35%, moderate LV dilation, mild RV dysfunction, moderate-severe MR, bioprosthetic aortic valve appeared normal, IVC normal.    He returns for followup of CHF.  Weight up over 10 lbs. He is short of breath just walking around his house.  Worse x 2-3 weeks.  +Orthopnea, sleeps on several pillows.  No chest pain.  No lightheadedness.  No palpitations. He has not taken any meds yet today.        Mildly dilated LV with LV EF 35%.  Mildly dilated RV with mildly decreased systolic function.  Moderate left atrial enlargement, no LA appendage thrombus.  Mild right atrial enlargement.  No ASD or PFO by color doppler.  Mild TR.  Bioprosthetic aortic valve with no significant stenosis or  regurgitation.  The posterior mitral leaflet was calcified and restricted.  There were 2 jets of mitral regurgitation, PISA ERO 0.21 cm^2 when applied to each jet, so total PISA ERO 0.42 cm^2.  MV mean gradient 2 mmHg.  There was flattening but not definite flow reversal in the pulmonary vein systolic doppler pattern.    Severe mitral regurgitation  Past Medical History:  Diagnosis Date  . AAA (abdominal aortic aneurysm) (HCC)   .  Atrial fibrillation (HCC)   . Atrial fibrillation (HCC)   . CAD (coronary artery disease)   . Cataract   . CHF (congestive heart failure) (HCC)   . Chronic kidney disease   . Colon polyps    adenomatous  . Diabetes mellitus   . Diverticulosis   . Dysrhythmia    A-Fib  . GERD (gastroesophageal reflux disease)   . Gout   . Hernia, abdominal   . History of aortic valve replacement with bioprosthetic valve    2007  . Hx: UTI (urinary tract infection)   . Hyperlipemia   . Hypertension   . Internal hemorrhoids   . Peripheral vascular disease (HCC)   . Shortness of breath    with exertion    Past Surgical History:  Procedure Laterality Date  . ABDOMINAL AORTIC ENDOVASCULAR STENT GRAFT N/A 04/13/2014   Procedure: ABDOMINAL AORTIC ENDOVASCULAR STENT GRAFT;  Surgeon: Nada Libman, MD;  Location: [facility] OR;  Service: Vascular;  Laterality: N/A;  . ANGIOPLASTY     Unsure if he had stents put in   . APPENDECTOMY    . CARDIAC VALVE SURGERY    . CARDIOVERSION N/A 06/06/2014   Procedure: CARDIOVERSION;  Surgeon: Laurey Morale, MD;  Location: [facility] ENDOSCOPY;  Service: Cardiovascular;  Laterality: N/A;  . CARDIOVERSION N/A 08/21/2014   Procedure: CARDIOVERSION;  Surgeon: Laurey Morale, MD;  Location: [facility] ENDOSCOPY;  Service: Cardiovascular;  Laterality: N/A;  . CARDIOVERSION N/A 12/22/2017   Procedure: CARDIOVERSION;  Surgeon: Laurey Morale, MD;  Location: [facility] ENDOSCOPY;  Service: Cardiovascular;  Laterality: N/A;  . CARDIOVERSION N/A 05/25/2019   Procedure: CARDIOVERSION;  Surgeon: Laurey Morale, MD;  Location: [facility] ENDOSCOPY;  Service: Cardiovascular;  Laterality: N/A;  . CARDIOVERSION N/A 06/14/2019   Procedure: CARDIOVERSION;  Surgeon: Jake Bathe, MD;  Location: [facility] ENDOSCOPY;  Service: Cardiovascular;  Laterality: N/A;  . CARDIOVERSION N/A 12/12/2020   Procedure: CARDIOVERSION;  Surgeon: Laurey Morale, MD;  Location: [facility] ENDOSCOPY;  Service: Cardiovascular;  Laterality: N/A;  .  CARDIOVERSION N/A 01/29/2021   Procedure: CARDIOVERSION;  Surgeon: Laurey Morale, MD;  Location: [facility] ENDOSCOPY;  Service: Cardiovascular;  Laterality: N/A;  . CARDIOVERSION N/A 03/19/2022   Procedure: CARDIOVERSION;  Surgeon: Laurey Morale, MD;  Location: [facility] ENDOSCOPY;  Service: Cardiovascular;  Laterality: N/A;  . CARDIOVERSION N/A 08/24/2022   Procedure: CARDIOVERSION;  Surgeon: Laurey Morale, MD;  Location: [facility] ENDOSCOPY;  Service: Cardiovascular;  Laterality: N/A;  . CARDIOVERSION N/A 09/23/2022   Procedure: CARDIOVERSION;  Surgeon: Laurey Morale, MD;  Location: [facility] ENDOSCOPY;  Service: Cardiovascular;  Laterality: N/A;  . CORONARY ARTERY BYPASS GRAFT    . ENDARTERECTOMY Right 02/02/2018   Procedure: ENDARTERECTOMY CAROTID RIGHT;  Surgeon: Nada Libman, MD;  Location: [facility] OR;  Service: Vascular;  Laterality: Right;  . EYE SURGERY Bilateral    cataract surgery  . HERNIA REPAIR     2 operations  . LEFT HEART CATHETERIZATION WITH CORONARY/GRAFT ANGIOGRAM N/A 07/10/2014   Procedure: LEFT HEART CATHETERIZATION WITH CORONARY/GRAFT  ANGIOGRAM;  Surgeon: Laurey Morale, MD;  Location: [facility] CATH LAB;  Service: Cardiovascular;  Laterality: N/A;  . RIGHT HEART CATH N/A 03/21/2024   Procedure: RIGHT HEART CATH;  Surgeon: Laurey Morale, MD;  Location: [facility] INVASIVE CV LAB;  Service: Cardiovascular;  Laterality: N/A;  . RIGHT HEART CATH AND CORONARY/GRAFT ANGIOGRAPHY N/A 08/21/2022   Procedure: RIGHT HEART CATH AND CORONARY/GRAFT ANGIOGRAPHY;  Surgeon: Laurey Morale, MD;  Location: [facility] INVASIVE CV LAB;  Service: Cardiovascular;  Laterality: N/A;  . TEE WITHOUT CARDIOVERSION N/A 05/25/2019   Procedure: TRANSESOPHAGEAL ECHOCARDIOGRAM (TEE);  Surgeon: Laurey Morale, MD;  Location: [facility] ENDOSCOPY;  Service: Cardiovascular;  Laterality: N/A;  . TEE WITHOUT CARDIOVERSION N/A 01/29/2021   Procedure: TRANSESOPHAGEAL ECHOCARDIOGRAM (TEE);  Surgeon: Laurey Morale, MD;  Location: [facility] ENDOSCOPY;  Service:  Cardiovascular;  Laterality: N/A;  . TEE WITHOUT CARDIOVERSION N/A 03/19/2022   Procedure: TRANSESOPHAGEAL ECHOCARDIOGRAM (TEE);  Surgeon: Laurey Morale, MD;  Location: [facility] ENDOSCOPY;  Service: Cardiovascular;  Laterality: N/A;  . TEE WITHOUT CARDIOVERSION N/A 08/24/2022   Procedure: TRANSESOPHAGEAL ECHOCARDIOGRAM (TEE);  Surgeon: Laurey Morale, MD;  Location: [facility] ENDOSCOPY;  Service: Cardiovascular;  Laterality: N/A;  . TEE WITHOUT CARDIOVERSION N/A 09/23/2022   Procedure: TRANSESOPHAGEAL ECHOCARDIOGRAM (TEE);  Surgeon: Laurey Morale, MD;  Location: [facility] ENDOSCOPY;  Service: Cardiovascular;  Laterality: N/A;  . TONSILLECTOMY    . VIDEO BRONCHOSCOPY Bilateral 12/25/2015   Procedure: VIDEO BRONCHOSCOPY WITHOUT FLUORO;  Surgeon: Roslynn Amble, MD;  Location: [facility] ENDOSCOPY;  Service: Cardiopulmonary;  Laterality: Bilateral;     Inpatient Medications: Scheduled Meds: . atorvastatin  20 mg Oral Daily  . Chlorhexidine Gluconate Cloth  6 each Topical Daily  . digoxin  0.0625 mg Oral Daily  . dorzolamide-timolol  1 drop Both Eyes BID  . ezetimibe  10 mg Oral QHS  . furosemide  80 mg Intravenous Once  . Gerhardt's butt cream  1 Application Topical BID  . insulin aspart  0-15 Units Subcutaneous TID WC  . insulin aspart  0-5 Units Subcutaneous QHS  . latanoprost  1 drop Both Eyes QHS  . leptospermum manuka honey  1 Application Topical Daily  . metolazone  2.5 mg Oral Once  . metoprolol succinate  50 mg Oral BID  . potassium chloride  40 mEq Oral Once  . sodium chloride flush  10-40 mL Intracatheter Q12H  . sodium chloride flush  3 mL Intravenous Q12H  . sodium chloride flush  3 mL Intravenous Q12H  . spironolactone  25 mg Oral Daily  . tamsulosin  0.4 mg Oral Daily  . Warfarin - Pharmacist Dosing Inpatient   Does not apply q1600   Continuous Infusions: . sodium chloride    . furosemide (LASIX) 200 mg in dextrose 5 % 100 mL (2 mg/mL) infusion     PRN Meds: sodium chloride,  acetaminophen, ondansetron (ZOFRAN) IV, sodium chloride flush, sodium chloride flush  Allergies:    Allergies  Allergen Reactions  . Barbiturates     Queasy and tired  . Grass Extracts [Gramineae Pollens] Other (See Comments)    Sneezing and runny nose  . Milk-Related Compounds Other (See Comments)    Just Yogurt.  Makes him very lethargic.  Marland Kitchen Amiodarone Anxiety    depression    Social History:   Social History   Socioeconomic History  . Marital status: Widowed    Spouse name: Not on file  . Number of children: Not on file  . Years of education: Not  on file  . Highest education level: Not on file  Occupational History  . Occupation: Prime Engineer, civil (consulting)   Tobacco Use  . Smoking status: Former    Current packs/day: 0.00    Average packs/day: 0.3 packs/day for 20.0 years (5.0 ttl pk-yrs)    Types: Cigarettes    Start date: 03/23/1972    Quit date: 03/23/1992    Years since quitting: 32.0    Passive exposure: Past  . Smokeless tobacco: Never  . Tobacco comments:    Former smoker 09/16/22  Vaping Use  . Vaping status: Never Used  Substance and Sexual Activity  . Alcohol use: Yes    Alcohol/week: 3.0 standard drinks of alcohol    Types: 3 Glasses of wine per week  . Drug use: No  . Sexual activity: Not Currently  Other Topics Concern  . Not on file  Social History Narrative   Widowed (lives alone at [place])- wife died at 24 (cancer at 40). Daughter 63, son 65 in 2023. 5 grandkids. 5 greatgrandkids.    -when patient were 30- started caring for 3 of his grandkids when daughter was not in a good place in the airforce.       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       Hobbies: golf, enjoys football and basketball at [employer]: Low Risk  (07/06/2023)   Overall Financial Resource Strain (CARDIA)   . Difficulty of Paying Living Expenses: Not hard at all  Food Insecurity: No Food Insecurity (03/21/2024)   Hunger  Vital Sign   . Worried About Programme researcher, broadcasting/film/video in the Last Year: Never true   . Ran Out of Food in the Last Year: Never true  Transportation Needs: No Transportation Needs (03/21/2024)   PRAPARE - Transportation   . Lack of Transportation (Medical): No   . Lack of Transportation (Non-Medical): No  Physical Activity: Sufficiently Active (07/06/2023)   Exercise Vital Sign   . Days of Exercise per Week: 7 days   . Minutes of Exercise per Session: 40 min  Stress: No Stress Concern Present (07/06/2023)   [employer] of Occupational Health - Occupational Stress Questionnaire   . Feeling of Stress : Not at all  Social Connections: Moderately Integrated (03/21/2024)   Social Connection and Isolation Panel [NHANES]   . Frequency of Communication with Friends and Family: More than three times a week   . Frequency of Social Gatherings with Friends and Family: More than three times a week   . Attends Religious Services: More than 4 times per year   . Active Member of Clubs or Organizations: Yes   . Attends Banker Meetings: 1 to 4 times per year   . Marital Status: Widowed  Intimate Partner Violence: Not At Risk (03/21/2024)   Humiliation, Afraid, Rape, and Kick questionnaire   . Fear of Current or Ex-Partner: No   . Emotionally Abused: No   . Physically Abused: No   . Sexually Abused: No    Family History:   The patient's family history includes AAA (abdominal aortic aneurysm) in his father; Heart disease in his father and mother; Melanoma in his sister. There is no history of Colon cancer.  ROS:  Please see the history of present illness.  ROS  All other ROS reviewed and negative.     Physical Exam/Data:   Vitals:   03/22/24 0945 03/22/24 1035 03/22/24 1045 03/22/24 1055  BP: Marland Kitchen)  104/58 (!) 87/53 (!) 93/51 90/60  Pulse: 72 78 74 82  Resp: 15 17 19 14   Temp:  (!) 97 F (36.1 C)    TempSrc:  Tympanic    SpO2: 94% 95% 95% 95%  Weight:      Height:         Intake/Output Summary (Last 24 hours) at 03/22/2024 1126 Last data filed at 03/22/2024 1033 Gross per 24 hour  Intake 457.21 ml  Output 850 ml  Net -392.79 ml   Filed Weights   03/21/24 0604 03/21/24 1518 03/22/24 0429  Weight: 82.6 kg 77.2 kg 76.9 kg   Body mass index is 24.33 kg/m.  General:  Well nourished, well developed, in no acute distress*** HEENT: normal Lymph: no adenopathy Neck: no JVD Endocrine:  No thryomegaly Vascular: No carotid bruits; FA pulses 2+ bilaterally without bruits  Cardiac:  normal S1, S2; RRR; no murmur *** Lungs:  clear to auscultation bilaterally, no wheezing, rhonchi or rales  Abd: soft, nontender, no hepatomegaly  Ext: no edema Musculoskeletal:  No deformities, BUE and BLE strength normal and equal Skin: warm and dry  Neuro:  CNs 2-12 intact, no focal abnormalities noted Psych:  Normal affect   EKG:  The EKG was personally reviewed and demonstrates:  *** Telemetry:  Telemetry was personally reviewed and demonstrates:  ***  Relevant CV Studies: ***  Laboratory Data:  Chemistry Recent Labs  Lab 03/21/24 0814 03/21/24 1539 03/22/24 0507  NA 140  138 135 136  K 4.3  4.5 5.6* 4.1  CL  --  99 96*  CO2  --  25 29  GLUCOSE  --  133* 132*  BUN  --  33* 32*  CREATININE  --  1.51* 1.35*  CALCIUM  --  9.1 9.0  GFRNONAA  --  44* 50*  ANIONGAP  --  11 11    Recent Labs  Lab 03/21/24 1539  PROT 7.1  ALBUMIN 3.7  AST 29  ALT 10  ALKPHOS 82  BILITOT 2.1*   Hematology Recent Labs  Lab 03/21/24 0814 03/21/24 1539  WBC  --  6.1  RBC  --  4.82  HGB 13.3  13.6 14.3  HCT 39.0  40.0 43.9  MCV  --  91.1  MCH  --  29.7  MCHC  --  32.6  RDW  --  15.1  PLT  --  168   Cardiac EnzymesNo results for input(s): "TROPONINI" in the last 168 hours. No results for input(s): "TROPIPOC" in the last 168 hours.  BNP Recent Labs  Lab 03/21/24 1539  BNP 946.3*    DDimer No results for input(s): "DDIMER" in the last 168  hours.  Radiology/Studies:  EP STUDY Result Date: 03/22/2024 See surgical note for result.  DG Chest 2 View Result Date: 03/22/2024 CLINICAL DATA:  CHF EXAM: CHEST - 2 VIEW COMPARISON:  Yesterday FINDINGS: Right PICC with tip at the lower SVC. Prior median sternotomy for CABG and aortic valve replacement. Chronic cardiomegaly. Interstitial opacity with some Kerley lines and blunting of both costophrenic sulci. Hyperinflation with diaphragm flattening. No consolidation. IMPRESSION: COPD with small pleural fluid and mild interstitial edema. Electronically Signed   By: Tiburcio Pea M.D.   On: 03/22/2024 09:28   DG Chest Port 1 View Result Date: 03/21/2024 CLINICAL DATA:  [id number] S/P PICC central line placement [id number] EXAM: PORTABLE CHEST 1 VIEW COMPARISON:  Chest x-ray 08/20/2022. CT chest 11/24/2022 FINDINGS: Right PICC with tip overlying the superior cavoatrial junction. The  heart and mediastinal contours are unchanged. Atherosclerotic plaque. Surgical changes overlie the mediastinum. Redemonstration of right upper lobe consolidation that is better evaluated on CT chest 11/24/2022. Chronic coarsened interstitial markings with no definite pulmonary edema. Persistent blunting of the left costophrenic angle. No pleural effusion. No pneumothorax. No acute osseous abnormality.  Intact sternotomy wires. IMPRESSION: 1. Right PICC in appropriate position. 2. Question bronchitic changes. 3.  Aortic Atherosclerosis (ICD10-I70.0). Electronically Signed   By: Tish Frederickson M.D.   On: 03/21/2024 19:49   CARDIAC CATHETERIZATION Result Date: 03/21/2024 1. Elevated right and left heart filling pressures. 2. Low CI and PAPi. 3. Moderate mixed pulmonary arterial/pulmonary venous hypertension. 4. Prominent V-waves in PCWP tracing, ?significant MR. I will admit for diuresis and investigation of MR.   [place] EKG SITE RITE Result Date: 03/21/2024 If Site Rite image not attached, placement could not be confirmed due to current  cardiac rhythm.    STS Risk Calculator:  *** MVRepair/replacement.   Assessment and Plan:   Zaniel Marineau. is a 89 y.o. male with symptoms of severe, stage *** mitral regurgitation with NYHA Class *** symptoms. I have reviewed the patient's recent echocardiogram which is notable for *** LV systolic function.   I have reviewed the natural history of mitral regurgitation with the patient. We have discussed the limitations of medical therapy and the poor prognosis associated with symptomatic mitral regurgitation. We have also reviewed potential treatment options, including palliative medical therapy, conventional surgical mitral valve repair or replacement, and percutaneous mitral valve repair with MitraClip. We discussed treatment options in the context of this patient's specific comorbid medical conditions.    The patient's predicted risk of mortality with conventional mitral valve replacement/repair is *** % / *** % respectively,  primarily based on ***. Other significant comorbid conditions ***.       Signed, Cline Crock, PA-C  03/22/2024 11:26 AM

## 2024-03-22 NOTE — Evaluation (Signed)
 Occupational Therapy Evaluation Patient Details Name: Nathaniel [name]. MRN: [id number] DOB: Feb 23, 1932 Today's Date: 03/22/2024   History of Present Illness   89 y.o. male admitted 03/21/24 for planned RHC and management of CHF. PMH includes vascular disease including CAD s/p CABG and redo CABG, AAA s/p endovascular repair in 2015, PAD, and carotid stenosis as well as prior AVR.     Clinical Impressions Pt is typically mod I, drives, takes bath (tub shower) 1x/wk, grandaughters bring meals to him. Uses [facility] in his ILF apt at [place]. Today he is fatigued, able to come to EOB min A. Despite max encouragement and education Pt declined further OOB at this time. Initiated energy conservation education as Pt states that is what has been hardest for him, no energy. Reviewed strategies verbally and will plan to bring handout next session. 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 [employer] sports. Pt mod A to return to bed after again offering transfer to recliner for pending lunch or bathroom. OT will follow acutely. At this time feel that post-acute OT would be best to maximize independence and safety with ADL as well as opportunity to rebuild strength and stamina however, Pt reaffirming what he told PT earlier today "I'd rather go to jail then to the assisted living part.". So in light of known patient decline of anywhere but home HHOT and family assist would be ideal. OT will continue to follow acutely and focus on activity tolerance, energy conservation education, and OOB transfers.      If plan is discharge home, recommend the following:   A little help with walking and/or transfers;A lot of help with bathing/dressing/bathroom;Assistance with cooking/housework;Assist for transportation     Functional Status Assessment   Patient has had a recent decline in their functional status and demonstrates the ability to make significant  improvements in function in a reasonable and predictable amount of time.     Equipment Recommendations   BSC/3in1     Recommendations for Other Services   PT consult     Precautions/Restrictions   Precautions Precautions: Fall Restrictions Weight Bearing Restrictions Per Provider Order: No     Mobility Bed Mobility Overal bed mobility: Needs Assistance Bed Mobility: Supine to Sit, Sit to Supine     Supine to sit: Min assist Sit to supine: Mod assist, Used rails   General bed mobility comments: HOB slightly elevated, with increased time pt able to bring LEs off EOB. minA for trunk elevation and to scoot to EOB, assist for BLE back into bed    Transfers                   General transfer comment: Pt politely declined despite max encouragement, and offering chair or BSC as options for transfer      Balance Overall balance assessment: Needs assistance Sitting-balance support: Feet supported, No upper extremity supported Sitting balance-Leahy Scale: Fair Sitting balance - Comments: unchallenged                                   ADL either performed or assessed with clinical judgement   ADL  Vision Baseline Vision/History: 1 Wears glasses Ability to See in Adequate Light: 0 Adequate Patient Visual Report: No change from baseline Vision Assessment?: No apparent visual deficits     Perception         Praxis         Pertinent Vitals/Pain Pain Assessment Pain Assessment: No/denies pain     Extremity/Trunk Assessment Upper Extremity Assessment Upper Extremity Assessment: Generalized weakness   Lower Extremity Assessment Lower Extremity Assessment: Defer to PT evaluation   Cervical / Trunk Assessment Cervical / Trunk Assessment: Kyphotic   Communication Communication Communication: Impaired Factors Affecting Communication: Hearing impaired   Cognition  Arousal: Alert Behavior During Therapy: WFL for tasks assessed/performed Cognition: No family/caregiver present to determine baseline             OT - Cognition Comments: Pt pleasant, tangential, easy to re-direct.                 Following commands: Intact       Cueing  General Comments   Cueing Techniques: Verbal cues  initiated conversation about energy conservation strategies during ADL   Exercises     Shoulder Instructions      Home Living Family/patient expects to be discharged to::  (ILF at [facility])                                 Additional Comments: own apartment with elevator, tub shower, higher toliet      Prior Functioning/Environment Prior Level of Function : Needs assist             Mobility Comments: was driving until recently, uses [facility] in home, limits community, still trades stock. Pt's granddaughters have brought groceries and take him to appts ADLs Comments: reports doing own dressing and bathing however only showers about 1x/week    OT Problem List: Decreased strength;Cardiopulmonary status limiting activity;Decreased activity tolerance;Impaired balance (sitting and/or standing);Decreased safety awareness;Decreased knowledge of use of DME or AE   OT Treatment/Interventions: Self-care/ADL training;Therapeutic exercise;DME and/or AE instruction;Therapeutic activities;Patient/family education;Balance training      OT Goals(Current goals can be found in the care plan section)   Acute Rehab OT Goals Patient Stated Goal: go home and NOT to ALF OT Goal Formulation: With patient Time For Goal Achievement: 04/05/24 Potential to Achieve Goals: Good ADL Goals Pt Will Perform Grooming: with modified independence;standing Pt Will Perform Upper Body Dressing: with modified independence;sitting Pt Will Perform Lower Body Dressing: with supervision;sit to/from stand Pt Will Transfer to Toilet: with supervision;ambulating Pt  Will Perform Toileting - Clothing Manipulation and hygiene: with modified independence;sitting/lateral leans Additional ADL Goal #1: Pt will verbalize at least 3 strategies for energy conservation during ADL with no cues   OT Frequency:  Min 2X/week    Co-evaluation              AM-PAC OT "6 Clicks" Daily Activity     Outcome Measure Help from another person eating meals?: None Help from another person taking care of personal grooming?: A Little Help from another person toileting, which includes using toliet, bedpan, or urinal?: A Lot Help from another person bathing (including washing, rinsing, drying)?: A Lot Help from another person to put on and taking off regular upper body clothing?: None Help from another person to put on and taking off regular lower body clothing?: A Lot 6 Click Score: 17   End of Session Nurse Communication: Mobility status;Precautions  Activity Tolerance: Patient limited by fatigue Patient left: in bed;with call bell/phone within reach;with bed alarm set  OT Visit Diagnosis: Unsteadiness on feet (R26.81);Muscle weakness (generalized) (M62.81);Other abnormalities of gait and mobility (R26.89);Other (comment) (Cardiopulmonary Status Limiting activity)                Time: 9629-5284 OT Time Calculation (min): 33 min Charges:  OT General Charges $OT Visit: 1 Visit OT Evaluation $OT Eval Moderate Complexity: 1 Mod OT Treatments $Self Care/Home Management : 8-22 mins  Nyoka Cowden OTR/L Acute Rehabilitation Services Office: [phone]  Evern Bio [facility] 03/22/2024, 1:34 PM

## 2024-03-22 NOTE — Progress Notes (Signed)
 Orthopedic Tech Progress Note Patient Details:  Nathaniel [name]. [birth year] [id number]  Ortho Devices Type of Ortho Device: Ace wrap, Unna boot Ortho Device/Splint Location: BLE Ortho Device/Splint Interventions: Ordered, Application, Adjustment   Post Interventions Patient Tolerated: Well Instructions Provided: Care of device  Donald Pore 03/22/2024, 1:09 PM

## 2024-03-22 NOTE — Progress Notes (Signed)
 OT Cancellation Note  Patient Details Name: Nathaniel [name]. MRN: [id number] DOB: 23-Mar-1932   Cancelled Treatment:    Reason Eval/Treat Not Completed: Patient at procedure or test/ unavailable (TEE) OT will follow acutely for evaluation as schedule allows  Emelda Fear 03/22/2024, 11:00 AM

## 2024-03-22 NOTE — Interval H&P Note (Signed)
 History and Physical Interval Note:  03/22/2024 10:01 AM  Nathaniel [name] [name]  has presented today for surgery, with the diagnosis of MR.  The various methods of treatment have been discussed with the patient and family. After consideration of risks, benefits and other options for treatment, the patient has consented to  Procedure(s): TRANSESOPHAGEAL ECHOCARDIOGRAM (N/A) as a surgical intervention.  The patient's history has been reviewed, patient examined, no change in status, stable for surgery.  I have reviewed the patient's chart and labs.  Questions were answered to the patient's satisfaction.     Lucille Witts [employer]

## 2024-03-22 NOTE — Plan of Care (Signed)
  Problem: Education: Goal: Understanding of CV disease, CV risk reduction, and recovery process will improve Outcome: Progressing Goal: Individualized Educational Video(s) Outcome: Progressing   Problem: Activity: Goal: Ability to return to baseline activity level will improve Outcome: Progressing   Problem: Cardiovascular: Goal: Ability to achieve and maintain adequate cardiovascular perfusion will improve Outcome: Progressing Goal: Vascular access site(s) Level 0-1 will be maintained Outcome: Progressing   Problem: Education: Goal: Ability to describe self-care measures that may prevent or decrease complications (Diabetes Survival Skills Education) will improve Outcome: Progressing Goal: Individualized Educational Video(s) Outcome: Progressing   Problem: Coping: Goal: Ability to adjust to condition or change in health will improve Outcome: Progressing   Problem: Fluid Volume: Goal: Ability to maintain a balanced intake and output will improve Outcome: Progressing   Problem: Health Behavior/Discharge Planning: Goal: Ability to identify and utilize available resources and services will improve Outcome: Progressing Goal: Ability to manage health-related needs will improve Outcome: Progressing   Problem: Metabolic: Goal: Ability to maintain appropriate glucose levels will improve Outcome: Progressing   Problem: Nutritional: Goal: Maintenance of adequate nutrition will improve Outcome: Progressing Goal: Progress toward achieving an optimal weight will improve Outcome: Progressing   Problem: Skin Integrity: Goal: Risk for impaired skin integrity will decrease Outcome: Progressing   Problem: Tissue Perfusion: Goal: Adequacy of tissue perfusion will improve Outcome: Progressing   Problem: Clinical Measurements: Goal: Ability to maintain clinical measurements within normal limits will improve Outcome: Progressing Goal: Will remain free from infection Outcome:  Progressing Goal: Diagnostic test results will improve Outcome: Progressing Goal: Respiratory complications will improve Outcome: Progressing Goal: Cardiovascular complication will be avoided Outcome: Progressing   Problem: Activity: Goal: Risk for activity intolerance will decrease Outcome: Progressing   Problem: Nutrition: Goal: Adequate nutrition will be maintained Outcome: Progressing   Problem: Elimination: Goal: Will not experience complications related to bowel motility Outcome: Progressing Goal: Will not experience complications related to urinary retention Outcome: Progressing   Problem: Safety: Goal: Ability to remain free from injury will improve Outcome: Progressing   Problem: Pain Managment: Goal: General experience of comfort will improve and/or be controlled Outcome: Progressing   Problem: Skin Integrity: Goal: Risk for impaired skin integrity will decrease Outcome: Progressing

## 2024-03-22 NOTE — Consult Note (Signed)
 WOC Nurse Consult Note: Reason for Consult: R buttock wound  Wound type: Stage 3 Pressure Injury  Pressure Injury POA: Yes Measurement: see nursing flowsheet  Wound bed: 90% red dry 10% yellow  Drainage (amount, consistency, odor) appears dry  Periwound: erythema, moisture associated skin damage  Dressing procedure/placement/frequency: Cleanse R buttock wound with NS, apply Medihoney to wound bed daily, cover with dry gauze.  Apply Gerhardt's Butt Cream to intact skin of buttocks.  Cover with silicone foam or ABD pad whichever preferred.   Patient would benefit from a pressure redistribution pad when up in chair.    POC discussed with bedside nurse. WOc team will not follow  Re-consult if further needs arise.   Thank you,    Priscella Mann MSN, RN-BC, [employer] [phone]

## 2024-03-23 ENCOUNTER — Telehealth: Payer: Self-pay

## 2024-03-23 ENCOUNTER — Encounter (HOSPITAL_COMMUNITY): Payer: Self-pay | Admitting: Cardiology

## 2024-03-23 DIAGNOSIS — I340 Nonrheumatic mitral (valve) insufficiency: Secondary | ICD-10-CM | POA: Diagnosis not present

## 2024-03-23 DIAGNOSIS — I5023 Acute on chronic systolic (congestive) heart failure: Secondary | ICD-10-CM | POA: Diagnosis not present

## 2024-03-23 LAB — BASIC METABOLIC PANEL WITH GFR
Anion gap: 12 (ref 5–15)
Anion gap: 12 (ref 5–15)
BUN: 32 mg/dL — ABNORMAL HIGH (ref 8–23)
BUN: 32 mg/dL — ABNORMAL HIGH (ref 8–23)
CO2: 31 mmol/L (ref 22–32)
CO2: 32 mmol/L (ref 22–32)
Calcium: 9.3 mg/dL (ref 8.9–10.3)
Calcium: 9.5 mg/dL (ref 8.9–10.3)
Chloride: 92 mmol/L — ABNORMAL LOW (ref 98–111)
Chloride: 91 mmol/L — ABNORMAL LOW (ref 98–111)
Creatinine, Ser: 1.47 mg/dL — ABNORMAL HIGH (ref 0.61–1.24)
Creatinine, Ser: 1.58 mg/dL — ABNORMAL HIGH (ref 0.61–1.24)
GFR, Estimated: 45 mL/min — ABNORMAL LOW (ref >60)
GFR, Estimated: 42 mL/min — ABNORMAL LOW (ref >60)
Glucose, Bld: 113 mg/dL — ABNORMAL HIGH (ref 70–99)
Glucose, Bld: 204 mg/dL — ABNORMAL HIGH (ref 70–99)
Potassium: 3.7 mmol/L (ref 3.5–5.1)
Potassium: 4 mmol/L (ref 3.5–5.1)
Sodium: 135 mmol/L (ref 135–145)
Sodium: 135 mmol/L (ref 135–145)

## 2024-03-23 LAB — PROTIME-INR
INR: 3.2 — ABNORMAL HIGH (ref 0.8–1.2)
Prothrombin Time: 33.3 s — ABNORMAL HIGH (ref 11.4–15.2)

## 2024-03-23 LAB — GLUCOSE, CAPILLARY
Glucose-Capillary: 104 mg/dL — ABNORMAL HIGH (ref 70–99)
Glucose-Capillary: 111 mg/dL — ABNORMAL HIGH (ref 70–99)
Glucose-Capillary: 210 mg/dL — ABNORMAL HIGH (ref 70–99)
Glucose-Capillary: 168 mg/dL — ABNORMAL HIGH (ref 70–99)

## 2024-03-23 LAB — DIGOXIN LEVEL: Digoxin Level: <0.2 ng/mL — ABNORMAL LOW (ref 0.8–2.0)

## 2024-03-23 LAB — COOXEMETRY PANEL
Carboxyhemoglobin: 2 % — ABNORMAL HIGH (ref 0.5–1.5)
Methemoglobin: 0.7 % (ref 0.0–1.5)
O2 Saturation: 63.5 %
Total hemoglobin: 13.6 g/dL (ref 12.0–16.0)

## 2024-03-23 LAB — MAGNESIUM: Magnesium: 2.2 mg/dL (ref 1.7–2.4)

## 2024-03-23 MED ORDER — FUROSEMIDE 10 MG/ML IJ SOLN
12.0000 mg/h | INTRAVENOUS | Status: DC
  Administered 2024-03-23 (×2): 12 mg/h via INTRAVENOUS
  Filled 2024-03-23 (×2): qty 20

## 2024-03-23 MED ORDER — TORSEMIDE 20 MG PO TABS: 40.0000 mg | ORAL_TABLET | Freq: Every day | ORAL | Status: DC

## 2024-03-23 MED ORDER — DIGOXIN 125 MCG PO TABS
0.1250 mg | ORAL_TABLET | Freq: Every day | ORAL | Status: DC
  Administered 2024-03-24: 0.125 mg via ORAL
  Filled 2024-03-23: qty 1

## 2024-03-23 MED ORDER — TORSEMIDE 20 MG PO TABS: 60.0000 mg | ORAL_TABLET | Freq: Every day | ORAL | Status: DC

## 2024-03-23 MED ORDER — POTASSIUM CHLORIDE CRYS ER 20 MEQ PO TBCR
40.0000 meq | EXTENDED_RELEASE_TABLET | Freq: Two times a day (BID) | ORAL | Status: DC
  Administered 2024-03-23 – 2024-03-24 (×3): 40 meq via ORAL
  Filled 2024-03-23 (×3): qty 2

## 2024-03-23 MED ORDER — METOLAZONE 2.5 MG PO TABS
2.5000 mg | ORAL_TABLET | Freq: Once | ORAL | Status: AC
  Administered 2024-03-23: 2.5 mg via ORAL
  Filled 2024-03-23: qty 1

## 2024-03-23 NOTE — Anesthesia Postprocedure Evaluation (Signed)
 Anesthesia Post Note  Patient: Nathaniel [name].  Procedure(s) Performed: TRANSESOPHAGEAL ECHOCARDIOGRAM     Patient location during evaluation: PACU Anesthesia Type: MAC Level of consciousness: awake and alert Pain management: pain level controlled Vital Signs Assessment: post-procedure vital signs reviewed and stable Respiratory status: spontaneous breathing, nonlabored ventilation, respiratory function stable and patient connected to nasal cannula oxygen Cardiovascular status: stable and blood pressure returned to baseline Postop Assessment: no apparent nausea or vomiting Anesthetic complications: no   No notable events documented.            Mariann Barter

## 2024-03-23 NOTE — Progress Notes (Addendum)
 Patient ID: Nathaniel Blanchfield., male   DOB: 1932/07/31, 89 y.o.   MRN: [id number]     Advanced Heart Failure Rounding Note  Cardiologist: None  Chief Complaint: CHF Subjective:    RHC 4/8: RA 15, PA 51/26 (40), PCWP 24 with v to 35, CO/CI (fick) 4.2/2.07, PVR 3.8, PAPi 1.7 TEE 4/9: LV EF 35%, mild reduced RV function, LAE, mild TR, severe MR (restricted mitral leaflets with 2 jets of regurg. MV mean gradiant 2 mmHg)  Coox 64. CVP 5 Net negative 3L. Weight down 7 lbs. On Lasix 12 mg/hr + Metolazone 2.5 mg  sCr slightly up 1.35>1.47  Feeling well this morning. Getting up to pee every 2 hours. No SOB, palpitations, or chest pain. Needing a lot of assistance with standing and transfer.  Objective:    Weight Range: 73.7 kg Body mass index is 23.31 kg/m.   Vital Signs:   Temp:  [96.9 F (36.1 C)-97.8 F (36.6 C)] 97.4 F (36.3 C) (04/10 0753) Pulse Rate:  [72-82] 75 (04/10 0753) Resp:  [14-24] 15 (04/10 0753) BP: (87-104)/(51-71) 94/61 (04/10 0753) SpO2:  [91 %-97 %] 94 % (04/10 0753) Weight:  [73.7 kg] 73.7 kg (04/10 0306)   Weight change: Filed Weights   03/21/24 1518 03/22/24 0429 03/23/24 0306  Weight: 77.2 kg 76.9 kg 73.7 kg   Intake/Output:  Intake/Output Summary (Last 24 hours) at 03/23/2024 0919 Last data filed at 03/23/2024 0854 Gross per 24 hour  Intake 757.55 ml  Output 4400 ml  Net -3642.45 ml    Physical Exam    CVP 5 General: Frail appearing.  Cardiac: S1 and S2 present. Systolic murmur 3/6 at apex and LUSB Extremities: Warm and dry.  1+ BLE edema. UNNA boots Neuro: Alert and oriented x3. Affect pleasant. Impulsive.  Lines/Devices:  RUE PICC  Telemetry   AF in 80s (personally reviewed)  Labs    CBC Recent Labs    03/21/24 0814 03/21/24 1539  WBC  --  6.1  NEUTROABS  --  4.0  HGB 13.3  13.6 14.3  HCT 39.0  40.0 43.9  MCV  --  91.1  PLT  --  168   Basic Metabolic Panel Recent Labs    91/47/82 1539 03/22/24 0507 03/23/24 0532  NA 135  136 135  K 5.6* 4.1 3.7  CL 99 96* 92*  CO2 25 29 31   GLUCOSE 133* 132* 113*  BUN 33* 32* 32*  CREATININE 1.51* 1.35* 1.47*  CALCIUM 9.1 9.0 9.3  MG 2.1  --  2.2   Liver Function Tests Recent Labs    03/21/24 1539  AST 29  ALT 10  ALKPHOS 82  BILITOT 2.1*  PROT 7.1  ALBUMIN 3.7   BNP: BNP (last 3 results) Recent Labs    06/23/23 0904 03/14/24 1537 03/21/24 1539  BNP 340.7* 1,091.5* 946.3*   ProBNP (last 3 results) Recent Labs    11/01/23 1241  PROBNP 5,110*   Hemoglobin A1C Recent Labs    03/21/24 1539  HGBA1C 7.0*   Thyroid Function Tests Recent Labs    03/21/24 1539  TSH 1.933   Imaging   ECHO TEE Result Date: 03/22/2024    TRANSESOPHOGEAL ECHO REPORT   Patient Name:   Nathaniel [name]. Date of Exam: 03/22/2024 Medical Rec #:  [id number]        Height:       70.0 in Accession #:    [id number]       Weight:  169.5 lb Date of Birth:  [birth year]        BSA:          1.946 m Patient Age:    89 years         BP:           99/67 mmHg Patient Gender: M                HR:           74 bpm. Exam Location:  Inpatient Procedure: Transesophageal Echo, 3D Echo, Cardiac Doppler and Color Doppler            (Both Spectral and Color Flow Doppler were utilized during            procedure). Indications:     mitral regurgitation  History:         Patient has prior history of Echocardiogram examinations, most                  recent 04/29/2023. CHF, Prior CABG, Arrythmias:Atrial                  Fibrillation; Risk Factors:Hypertension, Diabetes and                  Dyslipidemia.                  Aortic Valve: bioprosthetic valve is present in the aortic                  position. Procedure Date: 2007.  Sonographer:     Delcie Roch RDCS Referring Phys:  [phone] ALMA L DIAZ Diagnosing Phys: Wilfred Lacy PROCEDURE: After discussion of the risks and benefits of a TEE, an informed consent was obtained from the patient. The transesophogeal probe was passed without difficulty  through the esophogus of the patient. Imaged were obtained with the patient in a left lateral decubitus position. Sedation performed by different physician. The patient was monitored while under deep sedation. Anesthestetic sedation was provided intravenously by Anesthesiology: 166mg  of Propofol, 50mg  of Lidocaine. The patient developed no complications during the procedure.  IMPRESSIONS  1. Left ventricular ejection fraction, by estimation, is 35%. The left ventricle has moderately decreased function. The left ventricular internal cavity size was mildly dilated. Global hypokinesis with regional variation.  2. Peak RV-RA gradient 29 mmHg. Right ventricular systolic function is mildly reduced. The right ventricular size is normal.  3. Left atrial size was moderately dilated. No left atrial/left atrial appendage thrombus was detected.  4. Right atrial size was mildly dilated.  5. No ASD/PFO by color doppler.  6. Bioprosthetic aortic valve. Trivial perivalvular leakage. Mean gradient 6 mmHg, no significant stenosis.  7. The mitral valve is abnormal. The posterior leaflet is calcified and restricted. Severe mitral valve regurgitation via two moderate-appearing jets. PISA ERO is 0.21 cm^2 for jet 1 and 0.21 cm^2 for jet 2, additive ERO 0.42 cm^2. Vena contracta area for the 2 jets was 0.55 cm^2. No evidence of mitral stenosis. The mean mitral valve gradient is 3.0 mmHg. Moderate mitral annular calcification.  8. Normal caliber thoracic aorta with grade 3 plaque in the descending thoracic aorta.  9. 3D performed of the mitral valve. FINDINGS  Left Ventricle: Left ventricular ejection fraction, by estimation, is 35%. The left ventricle has moderately decreased function. The left ventricular internal cavity size was mildly dilated. There is no left ventricular hypertrophy. Right Ventricle: Peak RV-RA gradient 29 mmHg. The right ventricular size is  normal. No increase in right ventricular wall thickness. Right ventricular  systolic function is mildly reduced. Left Atrium: Left atrial size was moderately dilated. No left atrial/left atrial appendage thrombus was detected. Right Atrium: Right atrial size was mildly dilated. Pericardium: There is no evidence of pericardial effusion. Mitral Valve: The mitral valve is abnormal. There is moderate calcification of the mitral valve leaflet(s). Moderate mitral annular calcification. Severe mitral valve regurgitation. No evidence of mitral valve stenosis. MV peak gradient, 8.9 mmHg. The mean mitral valve gradient is 3.0 mmHg. Tricuspid Valve: The tricuspid valve is normal in structure. Tricuspid valve regurgitation is trivial. Aortic Valve: Bioprosthetic aortic valve. Trivial perivalvular leakage. Mean gradient 6 mmHg, no significant stenosis. The aortic valve has been repaired/replaced. Aortic valve regurgitation is trivial. Aortic valve mean gradient measures 6.0 mmHg. Aortic valve peak gradient measures 9.9 mmHg. There is a bioprosthetic valve present in the aortic position. Procedure Date: 2007. Pulmonic Valve: The pulmonic valve was normal in structure. Pulmonic valve regurgitation is trivial. Aorta: Normal caliber thoracic aorta with grade 3 plaque in the descending thoracic aorta. The aortic root is normal in size and structure. IAS/Shunts: No ASD/PFO by color doppler. Additional Comments: 3D was performed not requiring image post processing on an independent workstation and was abnormal. AORTIC VALVE AV Vmax:      157.00 cm/s AV Vmean:     115.000 cm/s AV VTI:       0.329 m AV Peak Grad: 9.9 mmHg AV Mean Grad: 6.0 mmHg MITRAL VALVE MV Peak grad: 8.9 mmHg MV Mean grad: 3.0 mmHg MV Vmax:      1.49 m/s MV Vmean:     65.3 cm/s Elton Heid McleanMD Electronically signed by Wilfred Lacy Signature Date/Time: 03/22/2024/2:29:36 PM    Final    EP STUDY Result Date: 03/22/2024 See surgical note for result.  Medications:    Scheduled Medications:  atorvastatin  20 mg Oral Daily   Chlorhexidine  Gluconate Cloth  6 each Topical Daily   digoxin  0.0625 mg Oral Daily   dorzolamide-timolol  1 drop Both Eyes BID   ezetimibe  10 mg Oral QHS   Gerhardt's butt cream  1 Application Topical BID   insulin aspart  0-15 Units Subcutaneous TID WC   insulin aspart  0-5 Units Subcutaneous QHS   latanoprost  1 drop Both Eyes QHS   leptospermum manuka honey  1 Application Topical Daily   metoprolol succinate  50 mg Oral BID   potassium chloride  40 mEq Oral BID   sodium chloride flush  10-40 mL Intracatheter Q12H   sodium chloride flush  3 mL Intravenous Q12H   sodium chloride flush  3 mL Intravenous Q12H   spironolactone  25 mg Oral Daily   tamsulosin  0.4 mg Oral Daily   Warfarin - Pharmacist Dosing Inpatient   Does not apply q1600    Infusions:  furosemide (LASIX) 200 mg in dextrose 5 % 100 mL (2 mg/mL) infusion 12 mg/hr (03/23/24 0854)    PRN Medications: acetaminophen, ondansetron (ZOFRAN) IV, sodium chloride flush, sodium chloride flush  Assessment/Plan   1.  Acute on Chronic Systolic Heart Failure: Ischemic cardiomyopathy.  Echo 9/23 with stable EF 45-50%, LV global hypokinesis, RV systolic function normal. Filling pressures normal on RHC (9/23). TEE in 10/23 showed EF 45%, diffuse HK, RV mildly decreased, moderate MR, bioprosthetic AoV stable with mean gradient 6 mmHg, no LAA. Echo 5/24 EF 30-35%, RV mildly reduced, MR moderate-severe. Suspect further drop in EF may be tachycardia-mediated  with permanent AF.  NYHA class IIIb symptoms.  RHC showed significantly increased left and right heart filling pressures despite augmented diuretic regimen at home. Cardiac index and PAPi marginal, with severe MR confirmed by TEE.  - Good UOP on Lasix 12 mg/hr. CVP 5. Stop Lasix gtt. Resume Torsemide 60 qam + 40 qpm this evening - Hold ramipril with soft BP.   - Continue Toprol XL 50 mg bid for now (for rate control) - Continue spironolactone 25 mg daily. - Continue digoxin 0.0625 mg daily - Off  Jardiance with h/o E coli UTI, may be able to restart in future.  - Unna boots  2.  CAD: s/p CABG and Redo CABG. LHC 06/2014 showed significant coronary disease w/ no good interventional targets (LIMA-LAD patent with 40-50% stenosis in LAD after touchdown, sequential SVG-ramus/OM/diagonal with only the ramus branch still intact (known from prior cath), SVG-PDA from original surgery TO, SVG-PDA from redo surgery with long 50-60% mid-graft stenosis). NSTEMI 9/23, repeat LHC showed occlusion of the 2nd SVG-PDA and 99% stenosis in the SVG-ramus.  Probably not much benefit to intervention on SVG-ramus.  Small territory covered and complex disease in the SVG.  No chest pain, doubt ACS.  - Now off ASA with warfarin use. Holding Warfarin with INR 3.2 today. - Continue atorvastatin 20 mg daily + Zetia.  - Continue Repatha at home.    3. Atrial fibrillation: Now permanent.  Seen by EP in past, not thought to be good ablation candidate. Failed Tikosyn and amiodarone.  - Holding warfarin. INR high, will allow down-trend. INR 3.2 today.  - Continue Toprol XL 50 mg bid   4. Bioprosthetic AVR: Normal function on 5/24 echo.   5. CKD stage 3: Follow closely with diuresis. Cr slightly up today with aggressive diuresis. BMET this afternoon.  6. Type 2DM:  - off SLGT2i given UTIs.  - Will give SSI.   7. PAD:  AAA s/p EVR, LE PAD + Carotid Artery Disease s/p Rt CEA. No current claudication symptoms.  - Followed by Dr. Myra Gianotti. - Continue  zetia + atorva 20 mg   8. Mitral regurgitation: Moderate-severe on 5/24 echo.  RHC 4/8 with prominent v-waves in PCWP tracing.  TEE yesterday with severe MR with restricted leaflets. Mean gradient 2. - Structural Heart to eval for mTEER   Length of Stay: 2  [place] Lee, NP  03/23/2024, 9:19 AM  Advanced Heart Failure Team Pager [phone] (M-F; 7a - 5p)  Please contact [facility] for night-coverage after hours (5p -7a ) and weekends on [email or web address]   Patient seen with  NP, I formulated the plan and agree with the above note.   Good diuresis, I/Os net negative 3044. Weight down 7 lbs.  CVP 11 on my read this morning.  Co-ox 63.5%.  Creatinine mildly higher 1.35 => 1.47.   Breathing is getting better.   TEE yesterday showed EF 35%, mild RV dysfunction, normal bioprosthetic aortic valve, severe mitral regurgitation with calcified/restricted posterior leaflet and 2 jets.   General: NAD Neck: JVP 12-14 cm, no thyromegaly or thyroid nodule.  Lungs: Clear to auscultation bilaterally with normal respiratory effort. CV: Nondisplaced PMI.  Heart irregular S1/S2, no S3/S4, 2/6 HSM apex.  1+ ankle edema.  Abdomen: Soft, nontender, no hepatosplenomegaly, no distention.  Skin: Intact without lesions or rashes.  Neurologic: Alert and oriented x 3.  Psych: Normal affect. Extremities: No clubbing or cyanosis.  HEENT: Normal.   1.  Acute on Chronic Systolic Heart Failure: Ischemic cardiomyopathy.  Echo 9/23 with stable EF 45-50%, LV global hypokinesis, RV systolic function normal. Filling pressures normal on RHC (9/23). TEE in 10/23 showed EF 45%, diffuse HK, RV mildly decreased, moderate MR, bioprosthetic AoV stable with mean gradient 6 mmHg, no LAA. Echo 5/24 EF 30-35%, RV mildly reduced, MR moderate-severe. Suspect further drop in EF may be tachycardia-mediated with permanent AF.  NYHA class IIIb symptoms.  RHC showed significantly increased left and right heart filling pressures despite augmented diuretic regimen at home. Cardiac index and PAPi marginal, concern for significant mitral regurgitation.  TEE yesterday showed EF 35%, mild RV dysfunction, normal bioprosthetic aortic valve, severe mitral regurgitation with calcified/restricted posterior leaflet and 2 jets.   Good diuresis yesterday, weight down 7 lbs. This morning, CVP 11 with co-ox 63.5%. Volume overloaded on exam still. Creatinine mildly higher 1.47.  - Continue Lasix gtt at 12 mg/hr for 1 more day.  Will give 1  dose metolazone 2.5 mg again.  - Hold ramipril with soft BP.   - Increase digoxin to 0.125 daily with low level.  - Continue Toprol XL 50 mg bid for now, needed for rate control.  - Continue spironolactone 25 mg daily. - Off Jardiance with h/o E coli UTI, may be able to restart in future.  - Severe MR, structural heart team assessing for Mitraclip.   - Unna boots 2.  CAD: s/p CABG and Redo CABG. LHC 06/2014 showed significant coronary disease w/ no good interventional targets (LIMA-LAD patent with 40-50% stenosis in LAD after touchdown, sequential SVG-ramus/OM/diagonal with only the ramus branch still intact (known from prior cath), SVG-PDA from original surgery TO, SVG-PDA from redo surgery with long 50-60% mid-graft stenosis). NSTEMI 9/23, repeat LHC showed occlusion of the 2nd SVG-PDA and 99% stenosis in the SVG-ramus.  Probably not much benefit to intervention on SVG-ramus.  Small territory covered and complex disease in the SVG.  No chest pain, doubt ACS.  - Now off ASA with warfarin use. - Continue atorvastatin 20 mg daily + Zetia.  - Continue Repatha at home.   3. Atrial fibrillation: Now permanent.  Seen by EP in past, not thought to be good ablation candidate. Failed Tikosyn and amiodarone.  - Continue warfarin. INR high, will allow down-trend.   - Continue Toprol XL 50 mg bid  4. Bioprosthetic AVR: Normal function on TEE.   5. CKD stage 3: Follow closely with diuresis.  6. Type 2DM:  - off SLGT2i given UTIs.  - Will give SSI.  7. PAD:  AAA s/p EVR, LE PAD + Carotid Artery Disease s/p Rt CEA. No current claudication symptoms.  - Followed by Dr. Myra Gianotti. - Continue lipid lowering agents per above.  8. Mitral regurgitation: Moderate-severe on 5/24 echo.  RHC with prominent v-waves in PCWP tracing.  I am concerned that severe MR may be contributing to his symptomatic worsening. TEE yesterday showed severe mitral regurgitation with calcified/restricted posterior leaflet and 2 jets.  -  Structural heart team to assess for mTEER, may not be good candidate with posterior leaflet calcification.  [name] 03/23/2024 11:00 AM

## 2024-03-23 NOTE — Progress Notes (Signed)
 Mobility Specialist Progress Note;   03/23/24 0945  Mobility  Activity Refused mobility   Pt refusing mobility at this time w/ max encouragement. Stated too many visitors in and out at this time. Pt left in bed with all needs met.   Caesar Bookman Mobility Specialist Please contact via SecureChat or [employer] [phone]
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
## 2024-03-23 NOTE — TOC Initial Note (Signed)
 Transition of Care (TOC) - Initial/Assessment Note    Patient Details  Name: Nathaniel [name]. MRN: [id number] Date of Birth: 05/08/1932  [facility]) CM/SW Contact:    Elliot Cousin, RN Phone Number: [phone] 03/23/2024, 5:11 PM  Clinical Narrative:                 TOC CM spoke to pt and son at bedside. Gave permission to speak to son and granddaughter. Pt is from [place] and states he received 4-6 visits per day from aide and medication assistance. [place] back to [place] apt. He has HHPT at [place]. Will continue to follow for dc needs.   Expected Discharge Plan: Home w Home Health Services Barriers to Discharge: Continued Medical Work up   Patient Goals and CMS Choice Patient states their goals for this hospitalization and ongoing recovery are:: wants to remain independent CMS Medicare.gov Compare Post Acute Care list provided to:: Patient   [place] ownership interest in [facility].provided to:: Patient    Expected Discharge Plan and Services   Discharge Planning Services: CM Consult Post Acute Care Choice: Home Health Living arrangements for the past 2 months: Assisted Living Facility                           HH Arranged: PT          Prior Living Arrangements/Services Living arrangements for the past 2 months: Assisted Living Facility Lives with:: Self Patient language and need for interpreter reviewed:: Yes Do you feel safe going back to the place where you live?: Yes      Need for Family Participation in Patient Care: Yes (Comment) Care giver support system in place?: Yes (comment) Current home services: DME (rolling walker, cane, wheelchair) Criminal Activity/Legal Involvement Pertinent to Current Situation/Hospitalization: No - Comment as needed  Activities of Daily Living   ADL Screening (condition at time of admission) Independently performs ADLs?: Yes (appropriate for developmental age) Is the  patient deaf or have difficulty hearing?: Yes Does the patient have difficulty seeing, even when wearing glasses/contacts?: No Does the patient have difficulty concentrating, remembering, or making decisions?: No  Permission Sought/Granted Permission sought to share information with : Case Manager, Family Supports, PCP Permission granted to share information with : Yes, Verbal Permission Granted  Share Information with NAME: Marqueze Ramcharan, Eldwin Volkov  Permission granted to share info w AGENCY: Home Health  Permission granted to share info w Relationship: son, granddaughter  Permission granted to share info w Contact Information: [phone]  Emotional Assessment Appearance:: Appears stated age Attitude/Demeanor/Rapport: Engaged Affect (typically observed): Accepting Orientation: : Oriented to Self, Oriented to Place, Oriented to  Time, Oriented to Situation   Psych Involvement: No (comment)  Admission diagnosis:  Acute on chronic systolic CHF (congestive heart failure) (HCC) [I50.23] Patient Active Problem List   Diagnosis Date Noted   Nonrheumatic mitral valve regurgitation 03/22/2024   Pressure injury of skin 03/22/2024   Acute on chronic systolic CHF (congestive heart failure) (HCC) 03/21/2024   NSTEMI (non-ST elevated myocardial infarction) (HCC) 08/20/2022   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) 04/01/2022   Aortic atherosclerosis (HCC) 02/19/2022   Secondary hypercoagulable state (HCC) 02/19/2022   Controlled type 2 diabetes mellitus with complication, without long-term current use of insulin (HCC) 02/19/2022   Chronic gout without tophus 02/19/2022   BPH (benign prostatic hyperplasia) 10/11/2021   Rhabdomyolysis  10/10/2021   Afib (HCC) 06/12/2019   Asymptomatic carotid artery stenosis without infarction, right 02/02/2018   Solitary pulmonary nodule 03/23/2016   Pulmonary nodule 12/25/2015   Aspiration of foreign body    Foreign body aspiration 12/23/2015    Encounter for therapeutic drug monitoring 03/28/2015   Chronic combined systolic and diastolic heart failure (HCC) 06/12/2014   AAA (abdominal aortic aneurysm) without rupture (HCC) 04/13/2014   Occlusion and stenosis of carotid artery without mention of cerebral infarction 04/09/2014   CAD (coronary artery disease) of artery bypass graft 09/14/2012   Abdominal aortic aneurysm (HCC) 09/14/2012   Peripheral vascular disease with claudication (HCC) 09/14/2012   Carotid artery disease without cerebral infarction (HCC) 09/14/2012   History of aortic valve replacement with bioprosthetic valve 09/14/2012   Essential hypertension 09/14/2012   Hyperlipidemia with target LDL less than 70 09/14/2012   PCP:  Shelva Majestic, MD Pharmacy:   [facility] - [place], [place] - [address] [place] [address] Phone: [phone] Fax: [phone]     Social Drivers of Health (SDOH) Social History: SDOH Screenings   Food Insecurity: No Food Insecurity (03/21/2024)  Housing: Low Risk  (03/21/2024)  Transportation Needs: No Transportation Needs (03/21/2024)  Utilities: Not At Risk (03/21/2024)  Depression (PHQ2-9): Low Risk  (07/06/2023)  Financial Resource Strain: Low Risk  (07/06/2023)  Physical Activity: Sufficiently Active (07/06/2023)  Social Connections: Moderately Integrated (03/21/2024)  Stress: No Stress Concern Present (07/06/2023)  Tobacco Use: Medium Risk (03/21/2024)  Health Literacy: Adequate Health Literacy (07/06/2023)   SDOH Interventions:     Readmission Risk Interventions     No data to display

## 2024-03-23 NOTE — Progress Notes (Signed)
 PT Cancellation Note  Patient Details Name: Nathaniel [name]. MRN: [id number] DOB: 1932/01/28   Cancelled Treatment:    Reason Eval/Treat Not Completed: Fatigue/lethargy limiting ability to participate  Patient adamantly refusing any form of activity despite PT and son talking to him about benefits of activity and risks of immobility. States he hasn't slept in 4 nights and is too exhausted to participate.    Jerolyn Center, PT Acute Rehabilitation Services  Office [phone]  Zena Amos 03/23/2024, 2:01 PM

## 2024-03-23 NOTE — Progress Notes (Signed)
 PHARMACY - ANTICOAGULATION CONSULT NOTE  Pharmacy Consult for Warfarin Indication: atrial fibrillation  Allergies  Allergen Reactions   Barbiturates     Queasy and tired   Grass Extracts [Gramineae Pollens] Other (See Comments)    Sneezing and runny nose   Milk-Related Compounds Other (See Comments)    Just Yogurt.  Makes him very lethargic.   Amiodarone Anxiety    depression    Patient Measurements: Height: 5\' 10"  (177.8 cm) Weight: 73.7 kg (162 lb 7.7 oz) IBW/kg (Calculated) : 73 HEPARIN DW (KG): 77.2  Vital Signs: Temp: 97.4 F (36.3 C) (04/10 0753) Temp Source: Oral (04/10 0753) BP: 94/61 (04/10 0753) Pulse Rate: 75 (04/10 0753)  Labs: Recent Labs    03/21/24 0611 03/21/24 0814 03/21/24 1539 03/22/24 0507 03/23/24 0532 03/23/24 0613  HGB  --  13.3  13.6 14.3  --   --   --   HCT  --  39.0  40.0 43.9  --   --   --   PLT  --   --  168  --   --   --   LABPROT 35.9*  --   --  37.0*  --  33.3*  INR 3.6*  --   --  3.7*  --  3.2*  CREATININE  --   --  1.51* 1.35* 1.47*  --     Estimated Creatinine Clearance: 35.2 mL/min (A) (by C-G formula based on SCr of 1.47 mg/dL (H)).   Medical History: Past Medical History:  Diagnosis Date   AAA (abdominal aortic aneurysm) (HCC)    Atrial fibrillation (HCC)    Atrial fibrillation (HCC)    CAD (coronary artery disease)    Cataract    CHF (congestive heart failure) (HCC)    Chronic kidney disease    Colon polyps    adenomatous   Diabetes mellitus    Diverticulosis    Dysrhythmia    A-Fib   GERD (gastroesophageal reflux disease)    Gout    Hernia, abdominal    History of aortic valve replacement with bioprosthetic valve    2007   Hx: UTI (urinary tract infection)    Hyperlipemia    Hypertension    Internal hemorrhoids    Peripheral vascular disease (HCC)    Shortness of breath    with exertion      Assessment: 89yom admitted with HF and volume overload started on iv furosemide. On Warfarin 9mg  wd  PTA, last dose taken 03/20/24.  No bleeding noted   INR still elevated at 3.2 today.  Goal of Therapy:  INR 2-3 Monitor platelets by anticoagulation protocol: Yes   Plan:  Continue holding warfarin today for elevated INR, but may need to continue to hold if decision made for MitraClip this admission?  Reece Leader, Colon Flattery, BCCP Clinical Pharmacist  03/23/2024 8:25 AM   [facility] pharmacy phone numbers are listed on [email or web address]

## 2024-03-24 ENCOUNTER — Telehealth: Payer: Self-pay

## 2024-03-24 ENCOUNTER — Other Ambulatory Visit (HOSPITAL_COMMUNITY): Payer: Self-pay

## 2024-03-24 DIAGNOSIS — I5023 Acute on chronic systolic (congestive) heart failure: Secondary | ICD-10-CM | POA: Diagnosis not present

## 2024-03-24 LAB — PROTIME-INR
INR: 2.6 — ABNORMAL HIGH (ref 0.8–1.2)
Prothrombin Time: 27.8 s — ABNORMAL HIGH (ref 11.4–15.2)

## 2024-03-24 LAB — COOXEMETRY PANEL
Carboxyhemoglobin: 2.1 % — ABNORMAL HIGH (ref 0.5–1.5)
Methemoglobin: <0.7 % (ref 0.0–1.5)
O2 Saturation: 64.5 %
Total hemoglobin: 13.7 g/dL (ref 12.0–16.0)

## 2024-03-24 LAB — BASIC METABOLIC PANEL WITH GFR
Anion gap: 11 (ref 5–15)
BUN: 33 mg/dL — ABNORMAL HIGH (ref 8–23)
CO2: 33 mmol/L — ABNORMAL HIGH (ref 22–32)
Calcium: 9.4 mg/dL (ref 8.9–10.3)
Chloride: 90 mmol/L — ABNORMAL LOW (ref 98–111)
Creatinine, Ser: 1.52 mg/dL — ABNORMAL HIGH (ref 0.61–1.24)
GFR, Estimated: 44 mL/min — ABNORMAL LOW (ref >60)
Glucose, Bld: 139 mg/dL — ABNORMAL HIGH (ref 70–99)
Potassium: 3.9 mmol/L (ref 3.5–5.1)
Sodium: 134 mmol/L — ABNORMAL LOW (ref 135–145)

## 2024-03-24 LAB — CBC
HCT: 40.3 % (ref 39.0–52.0)
Hemoglobin: 13.1 g/dL (ref 13.0–17.0)
MCH: 29.4 pg (ref 26.0–34.0)
MCHC: 32.5 g/dL (ref 30.0–36.0)
MCV: 90.4 fL (ref 80.0–100.0)
Platelets: 159 10*3/uL (ref 150–400)
RBC: 4.46 MIL/uL (ref 4.22–5.81)
RDW: 14.5 % (ref 11.5–15.5)
WBC: 6.8 10*3/uL (ref 4.0–10.5)
nRBC: 0 % (ref 0.0–0.2)

## 2024-03-24 LAB — GLUCOSE, CAPILLARY
Glucose-Capillary: 110 mg/dL — ABNORMAL HIGH (ref 70–99)
Glucose-Capillary: 169 mg/dL — ABNORMAL HIGH (ref 70–99)

## 2024-03-24 LAB — MAGNESIUM: Magnesium: 2 mg/dL (ref 1.7–2.4)

## 2024-03-24 MED ORDER — TORSEMIDE 20 MG PO TABS
60.0000 mg | ORAL_TABLET | Freq: Two times a day (BID) | ORAL | 5 refills | Status: DC
  Filled 2024-03-24: qty 200, 33d supply, fill #0

## 2024-03-24 MED ORDER — WARFARIN SODIUM 3 MG PO TABS
ORAL_TABLET | ORAL | 0 refills | Status: DC
  Filled 2024-03-24: qty 30, 30d supply, fill #0

## 2024-03-24 MED ORDER — TORSEMIDE 20 MG PO TABS: 60.0000 mg | ORAL_TABLET | Freq: Two times a day (BID) | ORAL | Status: DC

## 2024-03-24 MED ORDER — WARFARIN SODIUM 3 MG PO TABS
ORAL_TABLET | ORAL | 0 refills | Status: DC
  Filled 2024-03-24: qty 90, 30d supply, fill #0

## 2024-03-24 MED ORDER — DIGOXIN 125 MCG PO TABS
0.1250 mg | ORAL_TABLET | Freq: Every day | ORAL | 3 refills | Status: DC
Start: 2024-03-25 — End: 2024-10-10
  Filled 2024-03-24: qty 90, 90d supply, fill #0

## 2024-03-24 NOTE — Progress Notes (Addendum)
 Patient ID: Azreal Stthomas., male   DOB: 06-04-1932, 89 y.o.   MRN: [id number]      Advanced Heart Failure Rounding Note  Cardiologist: None  Chief Complaint: CHF Subjective:    RHC 4/8: RA 15, PA 51/26 (40), PCWP 24 with v to 35, CO/CI (fick) 4.2/2.07, PVR 3.8, PAPi 1.7 TEE 4/9: LV EF 35%, mild reduced RV function, LAE, mild TR, severe MR (restricted mitral leaflets with 2 jets of regurg. MV mean gradiant 2 mmHg)  Coox 65. CVP 6 Net negative 3.3L. Weight down 8lbs. Now on RA.  sCr slightly up 1.35>1.47>1.52.  Feeling much better this morning. Would prefer not to undergo mTEER as risks are too high. He would like to go so he can watch the Masters on a decent TV.   Objective:    Weight Range: 69.8 kg Body mass index is 22.08 kg/m.   Vital Signs:   Temp:  [97.3 F (36.3 C)-98.2 F (36.8 C)] 97.6 F (36.4 C) (04/11 0715) Pulse Rate:  [59-84] 76 (04/11 0715) Resp:  [13-19] 13 (04/11 0715) BP: (91-99)/(58-69) 91/62 (04/11 0715) SpO2:  [90 %-95 %] 93 % (04/11 0715) Weight:  [69.8 kg] 69.8 kg (04/11 0539) Last BM Date : 03/21/24 Weight change: Filed Weights   03/22/24 0429 03/23/24 0306 03/24/24 0539  Weight: 76.9 kg 73.7 kg 69.8 kg   Intake/Output:  Intake/Output Summary (Last 24 hours) at 03/24/2024 0914 Last data filed at 03/24/2024 0530 Gross per 24 hour  Intake 689.45 ml  Output 3450 ml  Net -2760.55 ml    Physical Exam    CVP 6 General: Frail appearing. No distress on RA Cardiac: JVP ~6cm. S1 and S2 present. No murmurs or rub. Extremities: Warm and dry.  No edema. + UNNA boots Neuro: Alert and oriented x3. Affect pleasant. Moves all extremities without difficulty. Lines/Devices:  RUE PICC  Telemetry   AF in 70-80s (personally reviewed)  Labs    CBC Recent Labs    03/21/24 1539 03/24/24 0407  WBC 6.1 6.8  NEUTROABS 4.0  --   HGB 14.3 13.1  HCT 43.9 40.3  MCV 91.1 90.4  PLT 168 159   Basic Metabolic Panel Recent Labs    81/19/14 0532  03/23/24 1406 03/24/24 0407  NA 135 135 134*  K 3.7 4.0 3.9  CL 92* 91* 90*  CO2 31 32 33*  GLUCOSE 113* 204* 139*  BUN 32* 32* 33*  CREATININE 1.47* 1.58* 1.52*  CALCIUM 9.3 9.5 9.4  MG 2.2  --  2.0   Liver Function Tests Recent Labs    03/21/24 1539  AST 29  ALT 10  ALKPHOS 82  BILITOT 2.1*  PROT 7.1  ALBUMIN 3.7   BNP: BNP (last 3 results) Recent Labs    06/23/23 0904 03/14/24 1537 03/21/24 1539  BNP 340.7* 1,091.5* 946.3*   ProBNP (last 3 results) Recent Labs    11/01/23 1241  PROBNP 5,110*   Hemoglobin A1C Recent Labs    03/21/24 1539  HGBA1C 7.0*   Thyroid Function Tests Recent Labs    03/21/24 1539  TSH 1.933   Imaging   No results found.  Medications:    Scheduled Medications:  atorvastatin  20 mg Oral Daily   Chlorhexidine Gluconate Cloth  6 each Topical Daily   digoxin  0.125 mg Oral Daily   dorzolamide-timolol  1 drop Both Eyes BID   ezetimibe  10 mg Oral QHS   Gerhardt's butt cream  1 Application Topical BID  insulin aspart  0-15 Units Subcutaneous TID WC   insulin aspart  0-5 Units Subcutaneous QHS   latanoprost  1 drop Both Eyes QHS   leptospermum manuka honey  1 Application Topical Daily   metoprolol succinate  50 mg Oral BID   potassium chloride  40 mEq Oral BID   sodium chloride flush  10-40 mL Intracatheter Q12H   sodium chloride flush  3 mL Intravenous Q12H   sodium chloride flush  3 mL Intravenous Q12H   spironolactone  25 mg Oral Daily   tamsulosin  0.4 mg Oral Daily   Warfarin - Pharmacist Dosing Inpatient   Does not apply q1600    Infusions:  furosemide (LASIX) 200 mg in dextrose 5 % 100 mL (2 mg/mL) infusion 12 mg/hr (03/24/24 0104)    PRN Medications: acetaminophen, ondansetron (ZOFRAN) IV, sodium chloride flush, sodium chloride flush  Assessment/Plan   1.  Acute on Chronic Systolic Heart Failure: Ischemic cardiomyopathy.  Echo 9/23 with stable EF 45-50%, LV global hypokinesis, RV systolic function  normal. Filling pressures normal on RHC (9/23). TEE in 10/23 showed EF 45%, diffuse HK, RV mildly decreased, moderate MR, bioprosthetic AoV stable with mean gradient 6 mmHg, no LAA. Echo 5/24 EF 30-35%, RV mildly reduced, MR moderate-severe. Suspect further drop in EF may be tachycardia-mediated with permanent AF.  NYHA class IIIb symptoms.  RHC showed significantly increased left and right heart filling pressures despite augmented diuretic regimen at home. Cardiac index and PAPi marginal, with severe MR confirmed by TEE.  - CVP 6. JVP 6cm. Stop Lasix. Resume Torsemide at 60 mg bid. - Hold ramipril with soft BP.   - Continue Toprol XL 50 mg bid for now (for rate control) - Continue spironolactone 25 mg daily. - Continue digoxin 0.0625 mg daily - Off Jardiance with h/o E coli UTI, may be able to restart in future.  - Unna boots  2.  CAD: s/p CABG and Redo CABG. LHC 06/2014 showed significant coronary disease w/ no good interventional targets (LIMA-LAD patent with 40-50% stenosis in LAD after touchdown, sequential SVG-ramus/OM/diagonal with only the ramus branch still intact (known from prior cath), SVG-PDA from original surgery TO, SVG-PDA from redo surgery with long 50-60% mid-graft stenosis). NSTEMI 9/23, repeat LHC showed occlusion of the 2nd SVG-PDA and 99% stenosis in the SVG-ramus.  Probably not much benefit to intervention on SVG-ramus.  Small territory covered and complex disease in the SVG.  No chest pain, doubt ACS.  - Now off ASA with warfarin use.  - INR 2.6. Resume warfarin tonight.  - Continue atorvastatin 20 mg daily + Zetia.  - Continue Repatha at home.    3. Atrial fibrillation: Now permanent.  Seen by EP in past, not thought to be good ablation candidate. Failed Tikosyn and amiodarone.  - INR 2.6. Restart Warfarin, discussed dosing with PharmD - Continue Toprol XL 50 mg bid   4. Bioprosthetic AVR: Normal function on 5/24 echo.   5. CKD stage 3: Follow closely with diuresis. Cr  slightly up today with aggressive diuresis. BMET this afternoon.  6. Type 2DM:  - off SLGT2i given UTIs.  - SSI  7. PAD:  AAA s/p EVR, LE PAD + Carotid Artery Disease s/p Rt CEA. No current claudication symptoms.  - Followed by Dr. Myra Gianotti. - Continue  zetia + atorva 20 mg   8. Mitral regurgitation: Moderate-severe on 5/24 echo.  RHC 4/8 with prominent v-waves in PCWP tracing.  TEE with severe MR with restricted leaflets. Mean  gradient 2. - Seen by Structural Heart, felt that mTEER would be difficult and high risk. Patient states that he does not want to undergo procedure.    Length of Stay: 3  [place] Lee, NP  03/24/2024, 9:14 AM  Advanced Heart Failure Team Pager [phone] (M-F; 7a - 5p)  Please contact [facility] for night-coverage after hours (5p -7a ) and weekends on [email or web address]   Patient seen with NP, I formulated the plan and agree with the above note.   Good diuresis again, weight has continued to trend down.  CVP 8 today, creatinine 1.52.   General: Frail.  Neck: JVP 8 cm, no thyromegaly or thyroid nodule.  Lungs: Clear to auscultation bilaterally with normal respiratory effort. CV: Nondisplaced PMI.  Heart irregular S1/S2, no S3/S4, 1/6 HSM apex.  1+ ankle edema.  Abdomen: Soft, nontender, no hepatosplenomegaly, no distention.  Skin: Intact without lesions or rashes.  Neurologic: Alert and oriented x 3.  Psych: Normal affect. Extremities: No clubbing or cyanosis.  HEENT: Normal.   1.  Acute on Chronic Systolic Heart Failure: Ischemic cardiomyopathy.  Echo 9/23 with stable EF 45-50%, LV global hypokinesis, RV systolic function normal. Filling pressures normal on RHC (9/23). TEE in 10/23 showed EF 45%, diffuse HK, RV mildly decreased, moderate MR, bioprosthetic AoV stable with mean gradient 6 mmHg, no LAA. Echo 5/24 EF 30-35%, RV mildly reduced, MR moderate-severe. Suspect further drop in EF may be tachycardia-mediated with permanent AF.  NYHA class IIIb symptoms.  RHC  showed significantly increased left and right heart filling pressures despite augmented diuretic regimen at home. Cardiac index and PAPi marginal, concern for significant mitral regurgitation.  TEE yesterday showed EF 35%, mild RV dysfunction, normal bioprosthetic aortic valve, severe mitral regurgitation with calcified/restricted posterior leaflet and 2 jets.   Good diuresis yesterday, weight down again. This morning, CVP 8. Creatinine stable 1.52.  - Stop Lasix gtt, start torsemide 60 mg bid for home.  - Hold ramipril with soft BP.   - Continue digoxin 0.125 daily with low level.  - Continue Toprol XL 50 mg bid, needed for rate control.  - Continue spironolactone 25 mg daily. - Off Jardiance with h/o E coli UTI, will hold off retrialing given history of urinary retention.   - Severe MR, structural heart team assessing for Mitraclip => probably not going to be a good candidate.   - Unna boots 2.  CAD: s/p CABG and Redo CABG. LHC 06/2014 showed significant coronary disease w/ no good interventional targets (LIMA-LAD patent with 40-50% stenosis in LAD after touchdown, sequential SVG-ramus/OM/diagonal with only the ramus branch still intact (known from prior cath), SVG-PDA from original surgery TO, SVG-PDA from redo surgery with long 50-60% mid-graft stenosis). NSTEMI 9/23, repeat LHC showed occlusion of the 2nd SVG-PDA and 99% stenosis in the SVG-ramus.  Probably not much benefit to intervention on SVG-ramus.  Small territory covered and complex disease in the SVG.  No chest pain, doubt ACS.  - Now off ASA with warfarin use. - Continue atorvastatin 20 mg daily + Zetia.  - Continue Repatha at home.   3. Atrial fibrillation: Now permanent.  Seen by EP in past, not thought to be good ablation candidate. Failed Tikosyn and amiodarone.  - Continue warfarin. INR high, will allow down-trend.   - Continue Toprol XL 50 mg bid  4. Bioprosthetic AVR: Normal function on TEE.   5. CKD stage 3: Follow closely with  diuresis.  6. Type 2DM:  - off SLGT2i given UTIs.  -  Will give SSI.  7. PAD:  AAA s/p EVR, LE PAD + Carotid Artery Disease s/p Rt CEA. No current claudication symptoms.  - Followed by Dr. Myra Gianotti. - Continue lipid lowering agents per above.  8. Mitral regurgitation: Moderate-severe on 5/24 echo.  RHC with prominent v-waves in PCWP tracing.  I am concerned that severe MR may be contributing to his symptomatic worsening. TEE yesterday showed severe mitral regurgitation with calcified/restricted posterior leaflet and 2 jets.  - Structural heart team assessed for mTEER, probably not a good candidate with posterior leaflet calcification but to be reviewed by imaging section.   I will let him go home today.  Followup in [place] HF clinic in 1 week.  Cardiac meds for home: torsemide 60 bid, KCl 40 bid, Toprol XL 50 bid, spironolactone 25 daily, digoxin 0.125 daily, Repatha, atorvastatin 20, Zetia 10, warfarin per coumadin clinic.   [name] 03/24/2024 11:18 AM .

## 2024-03-24 NOTE — Progress Notes (Signed)
 Occupational Therapy Treatment Patient Details Name: Nathaniel [name]. MRN: [id number] DOB: [birth year] Today's Date: 03/24/2024   History of present illness 89 y.o. male admitted 03/21/24 for planned RHC and management of CHF. PMH includes vascular disease including CAD s/p CABG and redo CABG, AAA s/p endovascular repair in 2015, PAD, and carotid stenosis as well as prior AVR.   OT comments  Pt agreeable to session today. Focus was dressing in preparation for discharge. Pt was mod to max A for LB dressing, set up for UB dressing. Pt reports this is difficult at home - that it takes him a long time and that he frequently will not put on PJs and so that he doesn't have to "do more work" encouraged Pt to work with [facility] therapies to achieve the independence by setting tangible goals that mean something to him to make therapy more relevant and he will be able to feel bigger impact. OT continues to be concerned about his level of function, safety awareness, balance, overall conditioning - however Pt adamant about returning to ILF.       If plan is discharge home, recommend the following:  A little help with walking and/or transfers;A lot of help with bathing/dressing/bathroom;Assistance with cooking/housework;Assist for transportation;Direct supervision/assist for medications management   Equipment Recommendations  BSC/3in1    Recommendations for Other Services PT consult    Precautions / Restrictions Precautions Precautions: Fall Restrictions Weight Bearing Restrictions Per Provider Order: No       Mobility Bed Mobility Overal bed mobility: Needs Assistance Bed Mobility: Supine to Sit, Sit to Supine     Supine to sit: Min assist     General bed mobility comments: HOB slightly elevated, with increased time pt able to bring LEs off EOB. minA for trunk elevation and to scoot to EOB    Transfers Overall transfer level: Needs assistance Equipment used: Rolling walker (2 wheels) Transfers:  Sit to/from Stand Sit to Stand: Mod assist           General transfer comment: rocking momentum, mod A to hold down RW and boost into standing     Balance Overall balance assessment: Needs assistance Sitting-balance support: Feet supported, No upper extremity supported Sitting balance-Leahy Scale: Fair Sitting balance - Comments: unchallenged   Standing balance support: Bilateral upper extremity supported, During functional activity, Reliant on assistive device for balance Standing balance-Leahy Scale: Poor Standing balance comment: reliant on RW                           ADL either performed or assessed with clinical judgement   ADL Overall ADL's : Needs assistance/impaired Eating/Feeding: Modified independent   Grooming: Contact guard assist;Standing;Wash/dry hands Grooming Details (indicate cue type and reason): fatigues quickly, SOB Upper Body Bathing: Moderate assistance Upper Body Bathing Details (indicate cue type and reason): for back Lower Body Bathing: Moderate assistance Lower Body Bathing Details (indicate cue type and reason): knees down Upper Body Dressing : Set up;Sitting Upper Body Dressing Details (indicate cue type and reason): undershirt, shirt, jacket Lower Body Dressing: Moderate assistance;Sit to/from stand Lower Body Dressing Details (indicate cue type and reason): assist to thread legs through underwear and pants, MAX A to don compression socks, assist to thread belt through loops Toilet Transfer: Moderate assistance;BSC/3in1;Rolling walker (2 wheels) Toilet Transfer Details (indicate cue type and reason): required RW held down as he pulls up on and mod A to come to standing Toileting- Clothing Manipulation and Hygiene:  Minimal assistance;Sit to/from stand       Functional mobility during ADLs: Minimal assistance;Cueing for safety;Cueing for sequencing;Rolling walker (2 wheels) General ADL Comments: decreased safety awareness, balance,  unsteady    Extremity/Trunk Assessment Upper Extremity Assessment Upper Extremity Assessment: Generalized weakness   Lower Extremity Assessment Lower Extremity Assessment: Defer to PT evaluation        Vision   Vision Assessment?: No apparent visual deficits   Perception     Praxis     Communication Communication Communication: Impaired Factors Affecting Communication: Hearing impaired   Cognition Arousal: Alert Behavior During Therapy: WFL for tasks assessed/performed Cognition: No family/caregiver present to determine baseline             OT - Cognition Comments: Pt pleasant, tangential, easy to re-direct.                 Following commands: Intact        Cueing   Cueing Techniques: Verbal cues  Exercises      Shoulder Instructions       General Comments      Pertinent Vitals/ Pain       Pain Assessment Pain Assessment: No/denies pain  Home Living                                          Prior Functioning/Environment              Frequency  Min 2X/week        Progress Toward Goals  OT Goals(current goals can now be found in the care plan section)  Progress towards OT goals: Progressing toward goals  Acute Rehab OT Goals Patient Stated Goal: get more independent OT Goal Formulation: With patient Time For Goal Achievement: 04/05/24 Potential to Achieve Goals: Good ADL Goals Pt Will Perform Grooming: with modified independence;standing Pt Will Perform Upper Body Dressing: with modified independence;sitting Pt Will Perform Lower Body Dressing: with supervision;sit to/from stand Pt Will Transfer to Toilet: with supervision;ambulating Pt Will Perform Toileting - Clothing Manipulation and hygiene: with modified independence;sitting/lateral leans Additional ADL Goal #1: Pt will verbalize at least 3 strategies for energy conservation during ADL with no cues  Plan      Co-evaluation                  AM-PAC OT "6 Clicks" Daily Activity     Outcome Measure   Help from another person eating meals?: None Help from another person taking care of personal grooming?: A Little Help from another person toileting, which includes using toliet, bedpan, or urinal?: A Lot Help from another person bathing (including washing, rinsing, drying)?: A Lot Help from another person to put on and taking off regular upper body clothing?: None Help from another person to put on and taking off regular lower body clothing?: A Lot 6 Click Score: 17    End of Session Equipment Utilized During Treatment: Rolling walker (2 wheels)  OT Visit Diagnosis: Unsteadiness on feet (R26.81);Muscle weakness (generalized) (M62.81);Other abnormalities of gait and mobility (R26.89);Other (comment) (Cardiopulmonary Status Limiting activity)   Activity Tolerance Patient tolerated treatment well   Patient Left Other (comment) (dc with RN)   Nurse Communication Mobility status;Precautions        Time: 1315-1350 OT Time Calculation (min): 35 min  Charges: OT General Charges $OT Visit: 1 Visit OT Treatments $Self Care/Home Management : 23-37 mins  Nyoka Cowden OTR/L Acute Rehabilitation Services Office: [phone]  [facility] 03/24/2024, 1:55 PM

## 2024-03-24 NOTE — TOC Transition Note (Addendum)
 Transition of Care [facility]) - Discharge Note   Patient Details  Name: Nathaniel [name]. MRN: [id number] Date of Birth: July 10, 1932  [facility] CM/SW Contact:  Elliot Cousin, RN Phone Number: [phone] 03/24/2024, 12:24 PM   Clinical Narrative:     TOC CM contacted [place] and left message for Amber for return call. Will fax dc summary to facility. Pt has HH and aide set up at facility. Granddtr to provide transportation home.  Hospital follow up appt added to AVS.   4/23 at 11 am with Dr Durene Cal  Received call from [facility], fax dc summary #[phone].  Offered choice medicare.gov list with rating, Amber states [facility] is a agency that they use. Contacted Beatris Ship with new referral.    Final next level of care: Home w Home Health Services Barriers to Discharge: No Barriers Identified   Patient Goals and CMS Choice Patient states their goals for this hospitalization and ongoing recovery are:: wants to remain independent CMS Medicare.gov Compare Post Acute Care list provided to:: Patient    ownership interest in [facility].provided to:: Patient    Discharge Placement                       Discharge Plan and Services Additional resources added to the After Visit Summary for     Discharge Planning Services: CM Consult Post Acute Care Choice: Home Health                    HH Arranged: PT          Social Drivers of Health (SDOH) Interventions SDOH Screenings   Food Insecurity: No Food Insecurity (03/21/2024)  Housing: Low Risk  (03/21/2024)  Transportation Needs: No Transportation Needs (03/21/2024)  Utilities: Not At Risk (03/21/2024)  Depression (PHQ2-9): Low Risk  (07/06/2023)  Financial Resource Strain: Low Risk  (07/06/2023)  Physical Activity: Sufficiently Active (07/06/2023)  Social Connections: Moderately Integrated (03/21/2024)  Stress: No Stress Concern Present (07/06/2023)  Tobacco Use: Medium Risk  (03/21/2024)  Health Literacy: Adequate Health Literacy (07/06/2023)     Readmission Risk Interventions     No data to display

## 2024-03-24 NOTE — Progress Notes (Signed)
 PT Cancellation Note  Patient Details Name: Nathaniel [name]. MRN: [id number] DOB: [birth year]   Cancelled Treatment:    Reason Eval/Treat Not Completed: Fatigue/lethargy limiting ability to participate. Pt declining, citing fatigue. Plan is for d/c back to ILF today. PT recommending home health.   Ilda Foil 03/24/2024, 11:33 AM

## 2024-03-24 NOTE — Progress Notes (Addendum)
 PHARMACY - ANTICOAGULATION CONSULT NOTE  Pharmacy Consult for Warfarin Indication: atrial fibrillation  Allergies  Allergen Reactions   Barbiturates     Queasy and tired   Grass Extracts [Gramineae Pollens] Other (See Comments)    Sneezing and runny nose   Milk-Related Compounds Other (See Comments)    Just Yogurt.  Makes him very lethargic.   Amiodarone Anxiety    depression    Patient Measurements: Height: 5\' 10"  (177.8 cm) Weight: 69.8 kg (153 lb 14.1 oz) IBW/kg (Calculated) : 73 HEPARIN DW (KG): 77.2  Vital Signs: Temp: 97.6 F (36.4 C) (04/11 0715) Temp Source: Oral (04/11 0715) BP: 91/62 (04/11 0715) Pulse Rate: 76 (04/11 0715)  Labs: Recent Labs    03/21/24 1539 03/22/24 0507 03/23/24 0532 03/23/24 0613 03/23/24 1406 03/24/24 0407  HGB 14.3  --   --   --   --  13.1  HCT 43.9  --   --   --   --  40.3  PLT 168  --   --   --   --  159  LABPROT  --  37.0*  --  33.3*  --  27.8*  INR  --  3.7*  --  3.2*  --  2.6*  CREATININE 1.51* 1.35* 1.47*  --  1.58* 1.52*    Estimated Creatinine Clearance: 32.5 mL/min (A) (by C-G formula based on SCr of 1.52 mg/dL (H)).   Medical History: Past Medical History:  Diagnosis Date   AAA (abdominal aortic aneurysm) (HCC)    Atrial fibrillation (HCC)    Atrial fibrillation (HCC)    CAD (coronary artery disease)    Cataract    CHF (congestive heart failure) (HCC)    Chronic kidney disease    Colon polyps    adenomatous   Diabetes mellitus    Diverticulosis    Dysrhythmia    A-Fib   GERD (gastroesophageal reflux disease)    Gout    Hernia, abdominal    History of aortic valve replacement with bioprosthetic valve    2007   Hx: UTI (urinary tract infection)    Hyperlipemia    Hypertension    Internal hemorrhoids    Peripheral vascular disease (HCC)    Shortness of breath    with exertion      Assessment: 89yom admitted with HF and volume overload started on iv furosemide. On Warfarin 9mg  wd PTA, last dose  taken 03/20/24.  No bleeding noted   INR still within range today at 2.6.    Goal of Therapy:  INR 2-3 Monitor platelets by anticoagulation protocol: Yes   Plan:  Continue holding warfarin until decision made for MitraClip Daily INR. Planning heparin drip when/if INR drops < 2.  Reece Leader, Loura Back, BCPS, [facility] Clinical Pharmacist  03/24/2024 9:52 AM   [facility] pharmacy phone numbers are listed on [email or web address]  Addendum - Decision made not to pursue mitral clip, going home today.  Recommend resuming warfarin this evening with PTA dose of  9 mg daily.  Discussed with patient.  Reece Leader, Colon Flattery, [facility] Clinical Pharmacist  03/24/2024 11:27 AM   [facility] pharmacy phone numbers are listed on [email or web address]

## 2024-03-24 NOTE — Telephone Encounter (Signed)
 Nathaniel [name]:  Fossa looks good for transseptal approach. Enough space within the LA to achieve straddle and manipulate down to the valve. Patient has annular calcification and bilateral leaflet tethering seen across the valve. A prominent calcified shelf on the posterior annulus was seen at P2. The patient appears to have 2 jets on either side of the posterior calcium with the medial jet appearing to have more density. In several of the long axis views the posterior leaflet appears significantly calcified and immobile. There were a few views where you can see chord moving but not actual leaflet. The 120 degree grasping view showed the most promising window where mobile leaflet can be seen past the calcium that extends into the leaflet. Will want to perform a bicom x-plane sweep day of the procedure to determine where mobile leaflet can be identified. Valve area measured at 3.0cm2 on a SAX transgastric and gradient measured at . Plan on placing first clip on either the medial or lateral side of the calcified P2 shelf, where mobile leaflet can be identified. Place first clip and see how MR responds. Due to extent of calcium, recommend smaller NT/NTW clip options for first implant to create less tension on leaflets.

## 2024-03-24 NOTE — Discharge Summary (Addendum)
 Advanced Heart Failure Team  Discharge Summary   Patient ID: Nathaniel [name]. MRN: [id number], DOB/AGE: Mar 02, 1932 89 y.o. Admit date: 03/21/2024 D/C date:     03/24/2024   Primary Discharge Diagnoses:  Acute on Chronic Systolic Heart Failure Mitral Regurgitation  Secondary Discharge Diagnoses:  CAD Permanent atrial fibrillation S/p bioprosthetic AVR CKD3 PAD  Hospital Course:   Nathaniel [name]. is a 89 y.o. with history of extensive vascular disease including CAD s/p CABG and redo CABG, AAA s/p endovascular repair in 2015, PAD, and carotid stenosis as well as prior AVR.    He was admitted 03/21/24 after RHC with Dr. Shirlee Latch for elevated filling pressures. He was started on a Lasix gtt with intermittent metolazone dosing. Overall diuresed 18lbs. He underwent TEE for further eval of his known severe MR. TEE showed EF 35% with sever MR with calcified/restricted leaflets and 2 jets. Structural Heart Team was consulted, however due to the amount of calcification, advanced age, and LV function, it was felt he would be too high risk for mTEER.   Seen today and deemed appropriate for discharge by Dr. Shirlee Latch with close [facility]HF Clinic follow up.   Hospital Course by Problem List:  1.  Acute on Chronic Systolic Heart Failure:  - Ischemic cardiomyopathy.   - EF drop to 30-35% 5/24, poss d/t tachycardia-mediated with permanent AF.  - RHC 4/8: RA 15, PA 51/26 (40), PCWP 24 with v to 35, CO/CI (fick) 4.2/2.07, PVR 3.8, PAPi 1.7  - TEE 4/9: EF 35%, mild reduced RV, severe MR - Resume Torsemide at 60 mg bid + 40 mEq KCL bid - Held ramipril with soft BP.   - Continue Toprol XL 50 mg bid for now (for rate control) - Continue spironolactone 25 mg daily. - Continue digoxin 0.0625 mg daily - Off Jardiance with h/o E coli UTI, may be able to restart in future.   2. Mitral regurgitation:  - RHC 4/8 with prominent v-waves in PCWP tracing.   - TEE with severe MR with restricted leaflets. Mean gradient 2. - Seen  by Structural Heart, felt that mTEER would be difficult and high risk. Patient states that he does not want to undergo procedure.   3.  CAD: s/p CABG and Redo CABG. LHC 06/2014 showed significant coronary disease w/ no good interventional targets (LIMA-LAD patent with 40-50% stenosis in LAD after touchdown, sequential SVG-ramus/OM/diagonal with only the ramus branch still intact (known from prior cath), SVG-PDA from original surgery TO, SVG-PDA from redo surgery with long 50-60% mid-graft stenosis). NSTEMI 9/23, repeat LHC showed occlusion of the 2nd SVG-PDA and 99% stenosis in the SVG-ramus.  Probably not much benefit to intervention on SVG-ramus.  Small territory covered and complex disease in the SVG.   - Now off ASA with warfarin use.  - INR 2.6. Resume warfarin 9 mg daily. - Continue atorvastatin 20 mg daily + Zetia.  - Continue Repatha at home.     4. Permanent Atrial fibrillation:  - Not a good ablation candidate. Failed Tikosyn and amiodarone.  - INR 2.6. Warfarin as above.  - Continue Toprol XL 50 mg bid    5. Bioprosthetic AVR: Normal function on TEE.    6. CKD stage 3: Cr 1.5 at discharge.   7. PAD:  AAA s/p EVAR, LE PAD + Carotid Artery Disease s/p Rt CEA.  - No current claudication symptoms.  - Continue  zetia + atorva 20 mg    Discharge Weight: 153.9 lbs Discharge Vitals: Blood pressure  98/71, pulse 71, temperature 97.6 F (36.4 C), temperature source Oral, resp. rate 19, height 5\' 10"  (1.778 m), weight 69.8 kg, SpO2 96%.  Labs: Lab Results  Component Value Date   WBC 6.8 03/24/2024   HGB 13.1 03/24/2024   HCT 40.3 03/24/2024   MCV 90.4 03/24/2024   PLT 159 03/24/2024    Recent Labs  Lab 03/21/24 1539 03/22/24 0507 03/24/24 0407  NA 135   < > 134*  K 5.6*   < > 3.9  CL 99   < > 90*  CO2 25   < > 33*  BUN 33*   < > 33*  CREATININE 1.51*   < > 1.52*  CALCIUM 9.1   < > 9.4  PROT 7.1  --   --   BILITOT 2.1*  --   --   ALKPHOS 82  --   --   ALT 10  --   --    AST 29  --   --   GLUCOSE 133*   < > 139*   < > = values in this interval not displayed.   Lab Results  Component Value Date   CHOL 164 07/06/2023   HDL 39.30 07/06/2023   LDLCALC 92 07/06/2023   TRIG 167.0 (H) 07/06/2023   BNP (last 3 results) Recent Labs    06/23/23 0904 03/14/24 1537 03/21/24 1539  BNP 340.7* 1,091.5* 946.3*    ProBNP (last 3 results) Recent Labs    11/01/23 1241  PROBNP 5,110*     Diagnostic Studies/Procedures   No results found.   Discharge Medications   Allergies as of 03/24/2024       Reactions   Barbiturates    Queasy and tired   Grass Extracts [gramineae Pollens] Other (See Comments)   Sneezing and runny nose   Milk-related Compounds Other (See Comments)   Just Yogurt.  Makes him very lethargic.   Amiodarone Anxiety   depression        Medication List     STOP taking these medications    Furoscix 80 MG/10ML Ctkt Generic drug: Furosemide   ramipril 2.5 MG capsule Commonly known as: ALTACE   sildenafil 25 MG tablet Commonly known as: VIAGRA       TAKE these medications    atorvastatin 20 MG tablet Commonly known as: LIPITOR Take 1 tablet (20 mg total) by mouth daily. Alternating with 30mg  What changed:  additional instructions Another medication with the same name was removed. Continue taking this medication, and follow the directions you see here.   Co Q10 200 MG Caps Take 200 mg by mouth daily.   digoxin 0.125 MG tablet Commonly known as: LANOXIN Take 1 tablet (0.125 mg total) by mouth daily. Start taking on: March 25, 2024   dorzolamide-timolol 2-0.5 % ophthalmic solution Commonly known as: COSOPT Place 1 drop into both eyes 2 (two) times daily.   ezetimibe 10 MG tablet Commonly known as: ZETIA TAKE ONE TABLET BY MOUTH AT BEDTIME   Glucosamine Chondroitin Triple Tabs Take 1 tablet by mouth daily.   latanoprost 0.005 % ophthalmic solution Commonly known as: XALATAN Place 1 drop into both eyes  at bedtime.   METAMUCIL FIBER PO Take 1 Dose by mouth as needed (constipation).   metoprolol succinate 50 MG 24 hr tablet Commonly known as: TOPROL-XL Take 1 tablet (50 mg total) by mouth in the morning and at bedtime. Take with or immediately following a meal.   potassium chloride SA 20 MEQ tablet  Commonly known as: KLOR-CON M Take 2 tablets (40 mEq total) by mouth 2 (two) times daily. What changed: how much to take   Repatha SureClick 140 MG/ML Soaj Generic drug: Evolocumab INJECT 1 PEN INTO SKIN EVERY 14 DAYS   spironolactone 25 MG tablet Commonly known as: ALDACTONE TAKE ONE TABLET BY MOUTH EVERY DAY   tamsulosin 0.4 MG Caps capsule Commonly known as: FLOMAX TAKE ONE CAPSULE BY MOUTH TWICE DAILY   torsemide 20 MG tablet Commonly known as: DEMADEX Take 3 tablets (60 mg total) by mouth 2 (two) times daily. What changed: See the new instructions.   warfarin 3 MG tablet Commonly known as: COUMADIN Take as directed. If you are unsure how to take this medication, talk to your nurse or doctor. Original instructions: Take 9 mg (3 tablets) daily TAKE THREE TABLETS DAILY OR AS DIRECTED BY COUMADIN CLINIC What changed: additional instructions               Durable Medical Equipment  (From admission, onward)           Start     Ordered   03/24/24 0000  Heart failure home health orders  (Heart failure home health orders / Face to face)       Comments: Heart Failure Follow-up Care:  Verify follow-up appointments per Patient Discharge Instructions. Confirm transportation arranged. Reconcile home medications with discharge medication list. Remove discontinued medications from use. Assist patient/caregiver to manage medications using pill box. Reinforce low sodium food selection Assessments: Vital signs and oxygen saturation at each visit. Assess home environment for safety concerns, caregiver support and availability of low-sodium foods. Consult Child psychotherapist, PT/OT,  Dietitian, and CNA based on assessments. Perform comprehensive cardiopulmonary assessment. Notify MD for any change in condition or weight gain of 3 pounds in one day or 5 pounds in one week with symptoms. Daily Weights and Symptom Monitoring: Ensure patient has access to scales. Teach patient/caregiver to weigh daily before breakfast and after voiding using same scale and record.    Teach patient/caregiver to track weight and symptoms and when to notify Provider. Activity: Develop individualized activity plan with patient/caregiver.   Question Answer Comment  Heart Failure Follow-up Care [facility] ([facility] at [phone]   Obtain the following labs Basic Metabolic Panel   Lab frequency Other see comments   Fax lab results to AHF Clinic at [phone]   Diet Low Sodium Heart Healthy   Fluid restrictions: 1800 mL Fluid   Skilled Nurse to notify MD of weight trends weekly for first 2 weeks. May fax or call: [facility] Clinic at [phone] (fax) or [phone]      03/24/24 1111            Disposition   The patient will be discharged in stable condition to home. Discharge Instructions     (HEART FAILURE PATIENTS) Call MD:  Anytime you have any of the following symptoms: 1) 3 pound weight gain in 24 hours or 5 pounds in 1 week 2) shortness of breath, with or without a dry hacking cough 3) swelling in the hands, feet or stomach 4) if you have to sleep on extra pillows at night in order to breathe.   Complete by: As directed    Diet - low sodium heart healthy   Complete by: As directed    Face-to-face encounter (required for Medicare/Medicaid patients)   Complete by: As directed    I [place] Lee certify that this patient is under my care and  that I, or a nurse practitioner or physician's assistant working with me, had a face-to-face encounter that meets the physician face-to-face encounter requirements with this patient on 03/24/2024. The encounter with the patient was in whole,  or in part for the following medical condition(s) which is the primary reason for home health care (List medical condition): heart failure   The encounter with the patient was in whole, or in part, for the following medical condition, which is the primary reason for home health care: heart failure   I certify that, based on my findings, the following services are medically necessary home health services:  Nursing Physical therapy     Reason for Medically Necessary Home Health Services: Therapy- Investment banker, operational, Patent examiner   My clinical findings support the need for the above services: Shortness of breath with activity   Further, I certify that my clinical findings support that this patient is homebound due to: Unsafe ambulation due to balance issues   Heart Failure patients record your daily weight using the same scale at the same time of day   Complete by: As directed    Heart failure home health orders   Complete by: As directed    Heart Failure Follow-up Care:  Verify follow-up appointments per Patient Discharge Instructions. Confirm transportation arranged. Reconcile home medications with discharge medication list. Remove discontinued medications from use. Assist patient/caregiver to manage medications using pill box. Reinforce low sodium food selection Assessments: Vital signs and oxygen saturation at each visit. Assess home environment for safety concerns, caregiver support and availability of low-sodium foods. Consult Child psychotherapist, PT/OT, Dietitian, and CNA based on assessments. Perform comprehensive cardiopulmonary assessment. Notify MD for any change in condition or weight gain of 3 pounds in one day or 5 pounds in one week with symptoms. Daily Weights and Symptom Monitoring: Ensure patient has access to scales. Teach patient/caregiver to weigh daily before breakfast and after voiding using same scale and record.    Teach patient/caregiver to track weight and  symptoms and when to notify Provider. Activity: Develop individualized activity plan with patient/caregiver.    Heart Failure Follow-up Care: [facility] ([facility] at [phone]   Obtain the following labs: Basic Metabolic Panel   Lab frequency: Other see comments   Fax lab results to: A[facility] Clinic at [phone]   Diet: Low Sodium Heart Healthy   Fluid restrictions: 1800 mL Fluid   Skilled Nurse to notify MD of weight trends weekly for first 2 weeks. May fax or call: [facility] Clinic at [phone] (fax) or [phone]   Increase activity slowly   Complete by: As directed    PICC line removal   Complete by: As directed        Follow-up Information     [facility] Follow up on 04/04/2024.   Specialty: Cardiology Why: at 3:40pm Heart and Vascular Tower Entrance C Contact information: [address] [place] [address] [phone]                 Duration of Discharge Encounter: 16 minutes  Signed, [place] Lee, NP 03/24/2024, 11:26 AM  Patient seen with NP, I formulated the plan and agree with the above note.   Please see my separate note from today.   I spent 33 minutes on this discharge.   [name] 03/24/2024 12:23 PM

## 2024-03-24 NOTE — Care Management Important Message (Signed)
 Important Message  Patient Details  Name: Nathaniel [name]. MRN: [id number] Date of Birth: 05-23-1932   Important Message Given:  Yes - Medicare IM     Dorena Bodo 03/24/2024, 3:29 PM

## 2024-03-28 ENCOUNTER — Ambulatory Visit: Attending: Cardiology

## 2024-03-28 ENCOUNTER — Telehealth: Payer: Self-pay

## 2024-03-28 NOTE — Telephone Encounter (Signed)
 Lpmt[facility] to reschedule INR appt

## 2024-04-03 ENCOUNTER — Telehealth (HOSPITAL_COMMUNITY): Payer: Self-pay | Admitting: Cardiology

## 2024-04-03 NOTE — Telephone Encounter (Signed)
 Called to confirm/remind patient of their appointment at the [facility] on 04/03/24.   Appointment:   [] Confirmed  [x] Left mess   [] No answer/No voice mail  [] VM Full/unable to leave message  [] Phone not in service  Patient reminded to bring all medications and/or complete list.  Confirmed patient has transportation. Gave directions, instructed to utilize valet parking.

## 2024-04-04 ENCOUNTER — Encounter: Payer: Self-pay | Admitting: Family

## 2024-04-04 ENCOUNTER — Inpatient Hospital Stay (HOSPITAL_COMMUNITY): Admit: 2024-04-04 | Admitting: Cardiology

## 2024-04-04 ENCOUNTER — Encounter: Admitting: Family

## 2024-04-04 ENCOUNTER — Ambulatory Visit (HOSPITAL_BASED_OUTPATIENT_CLINIC_OR_DEPARTMENT_OTHER): Admitting: Family

## 2024-04-04 ENCOUNTER — Telehealth: Payer: Self-pay

## 2024-04-04 ENCOUNTER — Other Ambulatory Visit
Admission: RE | Admit: 2024-04-04 | Discharge: 2024-04-04 | Disposition: A | Discharge status: Home / Self Care | Source: Ambulatory Visit | Attending: Family | Admitting: Family

## 2024-04-04 VITALS — BP 90/56 | HR 59 | Wt 156.2 lb

## 2024-04-04 DIAGNOSIS — Z951 Presence of aortocoronary bypass graft: Secondary | ICD-10-CM | POA: Diagnosis not present

## 2024-04-04 DIAGNOSIS — Z7901 Long term (current) use of anticoagulants: Secondary | ICD-10-CM | POA: Insufficient documentation

## 2024-04-04 DIAGNOSIS — N183 Chronic kidney disease, stage 3 unspecified: Secondary | ICD-10-CM | POA: Diagnosis not present

## 2024-04-04 DIAGNOSIS — E1151 Type 2 diabetes mellitus with diabetic peripheral angiopathy without gangrene: Secondary | ICD-10-CM | POA: Diagnosis not present

## 2024-04-04 DIAGNOSIS — I340 Nonrheumatic mitral (valve) insufficiency: Secondary | ICD-10-CM

## 2024-04-04 DIAGNOSIS — I5042 Chronic combined systolic (congestive) and diastolic (congestive) heart failure: Secondary | ICD-10-CM

## 2024-04-04 DIAGNOSIS — I4821 Permanent atrial fibrillation: Secondary | ICD-10-CM | POA: Insufficient documentation

## 2024-04-04 DIAGNOSIS — I2510 Atherosclerotic heart disease of native coronary artery without angina pectoris: Secondary | ICD-10-CM | POA: Insufficient documentation

## 2024-04-04 DIAGNOSIS — Z952 Presence of prosthetic heart valve: Secondary | ICD-10-CM | POA: Diagnosis not present

## 2024-04-04 DIAGNOSIS — Z79899 Other long term (current) drug therapy: Secondary | ICD-10-CM | POA: Insufficient documentation

## 2024-04-04 DIAGNOSIS — I5022 Chronic systolic (congestive) heart failure: Secondary | ICD-10-CM | POA: Insufficient documentation

## 2024-04-04 DIAGNOSIS — I255 Ischemic cardiomyopathy: Secondary | ICD-10-CM | POA: Insufficient documentation

## 2024-04-04 DIAGNOSIS — E1122 Type 2 diabetes mellitus with diabetic chronic kidney disease: Secondary | ICD-10-CM | POA: Diagnosis not present

## 2024-04-04 DIAGNOSIS — Z9889 Other specified postprocedural states: Secondary | ICD-10-CM | POA: Diagnosis not present

## 2024-04-04 LAB — CBC
HCT: 44.7 % (ref 39.0–52.0)
Hemoglobin: 14.7 g/dL (ref 13.0–17.0)
MCH: 29.5 pg (ref 26.0–34.0)
MCHC: 32.9 g/dL (ref 30.0–36.0)
MCV: 89.6 fL (ref 80.0–100.0)
Platelets: 149 10*3/uL — ABNORMAL LOW (ref 150–400)
RBC: 4.99 MIL/uL (ref 4.22–5.81)
RDW: 14 % (ref 11.5–15.5)
WBC: 5.4 10*3/uL (ref 4.0–10.5)
nRBC: 0.4 % — ABNORMAL HIGH (ref 0.0–0.2)

## 2024-04-04 LAB — BASIC METABOLIC PANEL WITH GFR
Anion gap: 12 (ref 5–15)
BUN: 46 mg/dL — ABNORMAL HIGH (ref 8–23)
CO2: 28 mmol/L (ref 22–32)
Calcium: 9.5 mg/dL (ref 8.9–10.3)
Chloride: 97 mmol/L — ABNORMAL LOW (ref 98–111)
Creatinine, Ser: 1.36 mg/dL — ABNORMAL HIGH (ref 0.61–1.24)
GFR, Estimated: 49 mL/min — ABNORMAL LOW (ref >60)
Glucose, Bld: 121 mg/dL — ABNORMAL HIGH (ref 70–99)
Potassium: 3.9 mmol/L (ref 3.5–5.1)
Sodium: 137 mmol/L (ref 135–145)

## 2024-04-04 LAB — DIGOXIN LEVEL: Digoxin Level: 0.7 ng/mL — ABNORMAL LOW (ref 0.8–2.0)

## 2024-04-04 LAB — BRAIN NATRIURETIC PEPTIDE: B Natriuretic Peptide: 719.3 pg/mL — ABNORMAL HIGH (ref 0.0–100.0)

## 2024-04-04 NOTE — Progress Notes (Signed)
 ADVANCED HF CLINIC NOTE                                                                                                                                                                                                                                                                                                                                                                                                                                                                                                   Patient ID: Nathaniel Kiner., male   DOB: Aug 23, 1932, 89 y.o.   MRN: [id number] PCP: Almira Jaeger, MD Cardiology: Dr. Mitzie Anda  Reason for visit: [facility] f/u for Systolic Heart Failure and severe MR   89 y.o. with history of extensive vascular disease including CAD s/p CABG and redo CABG, AAA s/p endovascular repair in 2015, PAD, and carotid stenosis as well as prior AVR.                             After his AAA repair, he was noted to be in atrial fibrillation.  He  had had a prior episode of atrial fibrillation after his cardiac surgery in 2007.  Atrial fibrillation was rate-controlled.  He was started on warfarin and Toprol  XL.  He developed exertional dyspnea and fatigue and was cardioverted back to SR 06/06/14.  Echo 06/15 EF worsened to 25-30% with stable bioprosthetic aortic valve, mildly dilated RV with normal systolic function.  LHC 07/15 showed stable anatomy.  The LIMA-LAD was patent, the sequential SVG-OM/ramus/D was patent only to ramus but this is the same as the prior cath, and the SVG-PDA was patent with a long 50-60% mid-graft stenosis.  No intervention.  Of note, at cath he was noted to be back in atrial fibrillation.  He was started on amiodarone  and had DCCV again on 08/21/14.  This was successful.  He has been off amiodarone  due to side effects (hallucinations).  Echo in 1/16 showed recovery of EF to 55-60%.    He was admitted in 1/19 with bronchitis/wheezing/dyspnea and was found  to be in atrial fibrillation with RVR.  He tested positive for metapneumovirus.  Echo showed EF 45-50% with normal bioprosthetic aortic valve. He was diuresed for volume overload while in the hospital. He had DCCV to NSR.    Carotid dopplers in 1/19 showed 80-99% RICA stenosis, he had right CEA in 2/19 with no complications.   At appointment in early 6/20, [name] was noted to be back in atrial fibrillation with RVR and mild CHF.  He was started on Toprol  XL and Lasix  was increased.  He had TEE-guided DCCV to NSR in 6/20.  TEE showed EF 50%, mild diffuse hypokinesis, mildly decreased RV systolic function, normally functioning bioprosthetic aortic valve.   In 2021, he was admitted for Tikosyn  initiation.  In 12/21, he went into atrial fibrillation again and was cardioverted back to NSR.   Echo in 1/22 showed EF 45-50%, diffuse hypokinesis, mildly decreased RV systolic function, moderate biatrial enlargement, bioprosthetic valve with mean gradient 8 mmHg.    In 2/22, he went into atrial fibrillation and had TEE-guided DCCV back to NSR.  TEE showed EF 45-50%.    In 3/22, he was back in the ER with atrial fibrillation triggered by [name] most likely.  He had a cardioversion back to NSR.   In 10/22, patient developed urinary retention and an E coli UTI.  In the setting of this, he had a fall at home and was unable to get up due to weakness for hours.  He developed rhabdomyolysis.  This resolved with minimal renal damage with IV fluid hydration and he was discharged to rehab.   Follow up 2/23 he was in atrial fibrillation, TEE/DCCV arranged however INR subtherapeutic. He cancelled rescheduled procedure due to a death in the family.  Follow up 3/23. Remained in AF. Volume overloaded. Lasix  increased for 3 days. Underwent TEE guided DCCV on 03/19/22.  TEE showed EF 50-55%, mildly decreased RV function, normal bioprosthetic aortic valve, mild MR and mild mitral stenosis (mean gradient 3 mmHg).     Recently diagnosed with stage IA2 NSCLC RUL. Saw Radiation Oncology and Pulmonary and decided on empiric treatment with SBRT.  He has completed radiation.   Admitted 9/23 with NSTEMI and AF with RVR. Underwent R/LHC showing normal filling pressures, known occlusion of native coronaries and SVG-RCA from original CABG. New occlusion of SVG-PDA from CABG#2. Long 99% stenosis in the SVG-ramus. Ramus is relatively small vessel. Patent LIMA-LAD with collaterals to RCA territory. Case was discussed with Dr. Lorie Rook, wouldn't benefit from intervention, medically  managed. Patient declined re-challenge of amiodarone  and was able to have successful TEE/DCCV to NSR. TEE showed EF 45% with normal RV, mild-moderate MR, normal bioprosthetic aortic valve, no LA appendage thrombus. Discharged home, weight 184 lbs.  Follow up 9/23, back in atrial fibrillation with HR 116. Toprol  increased to 25 bid and arranged for TEE/DCCV.  TEE/DCCV (10/23) showed EF 45%, diffuse HK, RV mildly decreased, moderate MR, Bioprosthetic AoV stable with mean gradient 6 mmHg, no LAA. Proceeded with successful DCCV to NSR.  However, later in 10/23 he was back in AF and has been in AF since that time.   Echo in 5/24 showed EF lower at 30-35%, moderate LV dilation, mild RV dysfunction, moderate-severe MR, bioprosthetic aortic valve appeared normal, IVC normal.   Seen in clinic earlier this month and was volume overloaded, Wt up over 10 lb. Dyspnea w/ minimal exertion, SOB just walking around his house. + orthopnea. ReDs was elevated at 42%. Denied CP. He was treated w/ Furoscix  followed by increase in PO torsemide  after that. He was arranged for outpatient RHC done on 03/21/24 which demonstrated elevated right and left heart filling pressures, moderate mixed pulmonary arterial/pulmonary venous hypertension, prominent V-waves in PCWP tracing concerning for significant MR, as well as low CI and low PAPi (RA 15, PCWP 24 w/ V waves up to 35, PA sat  58%, FICK CI 2.07, PAPi 1.7, PVR 3.8 WU).   He was direct admitted for IV diuretics and further investigation of MR. Placed on Lasix  gtt. PICC was placed to follow CVP and Co-oxes. Co-ox remained stable. Did not require inotropic support. He diuresed well on lasix  gtt and transitioned back to torsemide  at 60 mg bid + HF GDMT. TEE was done to further investigate the mitral valve. Study showed EF 35%, mild LV dilation, mildly dilated RV with mildly decreased systolic function, moderate left atrial enlargement, normally functioning AVR. There was moderate mitral annular calcification with severe mitral regurgitation with a calcified and restricted posterior leaflet. There were 2 jets of mitral regurgitation, PISA ERO 0.21 cm^2 when applied to each jet, so total PISA ERO 0.42 cm^2. MV mean gradient 2 mmHg.  There was flattening but not definite flow reversal in the pulmonary vein systolic doppler pattern.   Structural heart team was consulted for consideration for mTEER. Given severe mitral annular calcification, his anatomy was not felt suitable for clip.    Pt was discharged home on 4/11. Discharge wt 154 lb.   He presents today for post hospital f/u. Doing fairy well. C/w exertional dyspnea but overall improved from NYHA IIIB to NYHA IIb/III. Still sleeping w/ 2 pillows but no PND. ReDs improved, down from 42% previous visit to 34% today. Denies CP. Not checking wt daily at home but wt fairly stable in clinic today at 156 lb. No leg edema. Reports full med compliance w/ good UOP w/ torsemide . BP controlled, 90/56. No orthostatic symptoms. HR upper 50s. Reports compliance w/ coumadin .   Just celebrated his 89th birthday yesterday.   ECG: not performed   REDS 34%, normal     Labs (1/19): K 4.9, creatinine 1.19 Labs (2/19): K 4.2, creatinine 0.89 Labs (3/19): LDL 86, HDL 45, TGs 245 Labs (5/19): LDL 110, HDL 39 Labs (6/19): K 4.1, creatinine 1.07 Labs (11/19): LDL 14, HDL 56 Labs (6/20): K 4.1,  creatinine 1.0 Labs (7/20): K 4.3, creatinine 1.07 Labs (9/21): LDL 129, hgb 14.8, K 4.1, creatinine 1.02 Labs (12/21): LDL 20, HDL 46, K 4.4, creatinine 1.61 Labs (  1/22): LDL 45, HDL 49, K 4.3, creatinine 1.1, BNP 184 Labs (3/22): K 4.4, creatinine 1.10, LDL 15, HDL 49 Labs (6/22): K 4.3, creatinine 1.11, LDL 121 Labs (7/22): K 4.3, creatinine 1.23, hgb 14.5 Labs (11/22): K 4.5, creatinine 1.14 Labs (2/23): K 4.3, creatinine 1.16 Labs (7/23): K 4.4, creatinine 1.24 Labs (9/23): K 4.1, creatinine 1.19 Labs (10/23): K 4.2, creatinine 1.31 Labs (1/24): K 4.8, SCr 1.13, Hgb A1c 7.7 Labs (4/24): K 5, creatinine 1.56 => 1.33, BNP 726 Labs (5/24): K 4.7, SCr 1.29 BNP 895 Labs (5/24): K 4.9, SCr 2.14, BNP 398 Labs (6/24): K 4.6, SCr 1.66  Labs (7/24): LDL 92 Labs (11/24): K 4, creatinine 1.34, pro-BNP 5110 Labs (4/25): K 3.9, creatinine 1.52  PMH: 1. PAD: Peripheral arterial dopplers in 2012 showed > 75% bilateral SFA stenosis. Peripheral arterial dopplers in 10/14 showed > 50% focal bilateral SFA stenoses.  2. AAA: US  4/15 with 4.4 cm AAA but concern for penetrating ulcers. CTA abdomen showed 4.4 cm AAA with penetrating ulcers and possible pseudoaneurysms. Now s/p endovascular repair of AAA in 5/15 with no complications.  3. Carotid stenosis: Carotid dopplers (4/15) with 60-79% bilateral ICA stenosis. Carotid dopplers (11/15) with 60-79% bilateral ICA stenosis.  Carotid dopplers 6/16 with 60-79% bilateral ICA stenosis. - Carotid dopplers (1/18): 60-79% RICA stenosis.  - Carotid dopplers (1/19): 80-99% RICA stenosis => Right CEA in 2/19.  - Carotid dopplers (11/19): 40-59% bilateral stenosis.  - Carotid dopplers (1/21): 1-39% BICA stenosis.  4. CAD: CABG 1989 with LIMA-LAD, SVG-D, seq SVG-ramus and OM, sequential SVG-PDA/PLV. SVG-PDA/PLV found to be occluded on cath prior to AVR, so patient had SVG-PDA with AVR in 2007. LHC (7/15) with LIMA-LAD patent with 40-50% stenosis in LAD after  touchdown, sequential SVG-ramus/OM/diagonal with only the ramus branch still intact (known from prior cath), SVG-PDA from original surgery TO, SVG-PDA from redo surgery with long 50-60% mid-graft stenosis.  No target for intervention.  - LHC (9/23): known occlusion of native coronaries and SVG-RCA from original CABG, new occlusion of SVG-PDA from CABG#2, long 99% stenosis in the SVG-ramus (was sequential SVG-ramus/OM/D but OM and D branches noted to be lost on prior cath), ramus is relatively small vessel, patent LIMA-LAD with collaterals to RCA territory. Case reviewed with Dr. Lorie Rook, probably not much benefit to intervention on SVG-ramus. Small territory covered and complex disease in SVG. Medical management advised. 5. Severe aortic stenosis: Bioprosthetic AVR in 2007.  6. Atrial fibrillation: Paroxysmal. Initially noted after cardiac surgery, then again after AAA repair.  DCCV to NSR 06/06/14.  DCCV to NSR again 08/21/14.  - DCCV to NSR in 1/19.  - DCCV to NSR in 6/20. .  - DCCV to NSR 12/21, 2/22, 3/22, 4/23, 9/23, 10/23 - Now permanent, off Tikosyn .  7. Type II diabetes  8. Gout  9. GERD  10. Hyperlipidemia  11. HTN 12. GERD  13. Chronic systolic CHF: Echo (6/15) with EF worsened to 25-30% with grade II diastolic dysfunction, stable bioprosthetic aortic valve, mildly dilated RV with normal systolic function.  Echo (1/16) with EF 55-60%, basal inferior hypokinesis, mild LVH, bioprosthetic aortic valve with mean gradient 13 mmHg, mild MR, severe LAE.  - Echo (1/19): EF 45-50%, mild MR, normal bioprosthetic aortic valve.  - TEE (6/20): EF 50% with mild diffuse hypokinesis, mildly decreased RV systolic function with mild RV enlargement, bioprosthetic aortic valve looked ok, mild MR. - Echo (1/22):  EF 45-50%, diffuse hypokinesis, mildly decreased RV systolic function, moderate biatrial enlargement, bioprosthetic  valve with mean gradient 8 mmHg.   - TEE (2/22): EF 45-50%, RV normal, moderate  biatrial enlargement, bioprosthetic AoV with mean gradient 6 mmHg, mild MR.  - TEE (4/23): EF 50-55%, mildly decreased RV function, normal bioprosthetic aortic valve, mild MR and mild mitral stenosis (mean gradient 3 mmHg) - TEE (9/23): EF 45%, RV normal, no LAA thrombus, no PFO/ASD, mild to moderate MR w/ moderate MAC, AoV without stenosis/regurgitation - TEE (10/23): EF 45%, RV mildly decreased, no LAA thrombus, no PFO/ASD, moderate MR, AoV without stenosis/regurgitation, mean gradient 6 mmHg. - Echo (5/24): EF 30-35%, moderate LV dilation, mild RV dysfunction, moderate-severe MR, bioprosthetic aortic valve appeared normal, IVC normal.  - TEE (4/25): EF 35%, mild LV dilation, mildly dilated RV with mildly decreased systolic function, moderate left atrial enlargement, normally functioning AVR, severe MR  14. BPH  15. Nonsmall cell lung cancer: Suspected by imaging, treated in 2023 with radiation.  16. Fall with rhabdomyolysis (10/22)  17. Severe Mitral Regurgitation - TEE (4/25) moderate MAC with severe MR with a calcified and restricted posterior leaflet. There were 2 jets of mitral regurgitation, PISA ERO 0.21 cm^2 when applied to each jet, so total PISA ERO 0.42 cm^2. MV mean gradient 2 mmHg.  There was flattening but not definite flow reversal in the pulmonary vein systolic doppler pattern.  SH: Widower, 2 children, raised his 3 grand-daughters, lives in [place], [place].  Occasional ETOH, no smoking.   FH: Father died from ruptured AAA.   ROS: All systems reviewed and negative except as per HPI.   Current Outpatient Medications  Medication Sig Dispense Refill   atorvastatin  (LIPITOR) 20 MG tablet Take 1 tablet (20 mg total) by mouth daily. Alternating with 30mg  (Patient taking differently: Take 20 mg by mouth daily.) 30 tablet 5   Coenzyme Q10 (CO Q10) 200 MG CAPS Take 200 mg by mouth daily.     digoxin  (LANOXIN ) 0.125 MG tablet Take 1 tablet (0.125 mg total) by mouth daily. 90  tablet 3   dorzolamide -timolol  (COSOPT ) 22.3-6.8 MG/ML ophthalmic solution Place 1 drop into both eyes 2 (two) times daily.     ezetimibe  (ZETIA ) 10 MG tablet TAKE ONE TABLET BY MOUTH AT BEDTIME 90 tablet 3   latanoprost  (XALATAN ) 0.005 % ophthalmic solution Place 1 drop into both eyes at bedtime.     METAMUCIL FIBER PO Take 1 Dose by mouth as needed (constipation).     metoprolol  succinate (TOPROL -XL) 50 MG 24 hr tablet Take 1 tablet (50 mg total) by mouth in the morning and at bedtime. Take with or immediately following a meal. 60 tablet 3   Misc Natural Products (GLUCOSAMINE CHONDROITIN TRIPLE) TABS Take 1 tablet by mouth daily.     potassium chloride  SA (KLOR-CON  M) 20 MEQ tablet Take 2 tablets (40 mEq total) by mouth 2 (two) times daily. (Patient taking differently: Take 20 mEq by mouth 2 (two) times daily.)     REPATHA  SURECLICK 140 MG/ML SOAJ INJECT 1 PEN INTO SKIN EVERY 14 DAYS 2 mL 11   spironolactone  (ALDACTONE ) 25 MG tablet TAKE ONE TABLET BY MOUTH EVERY DAY 30 tablet 5   tamsulosin  (FLOMAX ) 0.4 MG CAPS capsule TAKE ONE CAPSULE BY MOUTH TWICE DAILY 180 capsule 3   torsemide  (DEMADEX ) 20 MG tablet Take 3 tablets (60 mg total) by mouth 2 (two) times daily. 200 tablet 5   warfarin (COUMADIN ) 3 MG tablet Take 9 mg (3 tablets) daily TAKE THREE TABLETS DAILY OR AS DIRECTED BY COUMADIN  CLINIC 90  tablet 0   No current facility-administered medications for this visit.   BP (!) 90/56   Pulse (!) 59   Wt 156 lb 3.2 oz (70.9 kg)   SpO2 96%   BMI 22.41 kg/m    Wt Readings from Last 3 Encounters:  04/04/24 156 lb 3.2 oz (70.9 kg)  03/24/24 153 lb 14.1 oz (69.8 kg)  03/14/24 182 lb 3.2 oz (82.6 kg)   PHYSICAL EXAM: ReDs 34%, normal  General:  Well appearing, elderly male, in WC. No respiratory difficulty HEENT: normal Neck: supple. JVD not elevated. Carotids 2+ bilat; no bruits. No lymphadenopathy or thyromegaly appreciated. Cor: PMI nondisplaced. Irregularly irregular rhythm and rate.  2/6 HSM at apex Lungs: CTAB Abdomen: soft, nontender, nondistended. No hepatosplenomegaly. No bruits or masses. Good bowel sounds. Extremities: no cyanosis, clubbing. + chronic venous stasis dermatitis bilaterally but no edema Neuro: alert & oriented x 3, cranial nerves grossly intact. moves all 4 extremities w/o difficulty. Affect pleasant.   Assessment/Plan:  1.  Chronic Systolic Heart Failure: Ischemic cardiomyopathy.  Echo 9/23 with stable EF 45-50%, LV global hypokinesis, RV systolic function normal. Filling pressures normal on RHC (9/23). TEE in 10/23 showed EF 45%, diffuse HK, RV mildly decreased, moderate MR, bioprosthetic AoV stable with mean gradient 6 mmHg, no LAA. Echo 5/24 EF 30-35%, RV mildly reduced, MR moderate-severe. Suspect further drop in EF may be tachycardia-mediated with permanent AF.  Recent admit for a/c CHF. TEE 4/25 EF 35%, RV mildly reduced. RHC 4/25 demonstrated elevated right and left heart filling pressures, moderate mixed pulmonary arterial/pulmonary venous hypertension, prominent V-waves in PCWP tracing concerning for significant MR, as well as low CI and low PAPi (RA 15, PCWP 24 w/ V waves up to 35, PA sat 58%, FICK CI 2.07, PAPi 1.7, PVR 3.8 WU). He diuresed well w/ lasix  gtt and transitioned back to PO torsemide . D/c wt 154 lb. Volume status much improved. Wt fairly stable since d/c at 156 lb. ReDs has returned to normal at 34% today (42% previous visit). Remains NYHA class III, in setting of severe MR.  - Continue torsemide  60 mg bid. Encouraged to check wt daily and call if > 3 lb gain in 24 hr or > 5 lb in 1 wk   - Continue Toprol  XL 50 mg bid.  - Continue spironolactone  25 mg daily. - Continue Digoxin  0.125. Check Dig level today  - Off Jardiance with h/o E coli UTI, may be able to restart in future.  - BP currently too soft for ARB/ARNI. May be able to restart low dose ramipril  soon  - BMP/BNP today  2. Severe Mitral Regurgitation: prominent V waves on PCWP  tracing, up to 35, on recent RHC. Subsequent TEE 4/25 confirmed severe mitral regurgitation with a calcified and restricted posterior leaflet. Mod-severe MAC. There were 2 jets of mitral regurgitation, PISA ERO 0.21 cm^2 when applied to each jet, so total PISA ERO 0.42 cm^2. MV mean gradient 2 mmHg.  There was flattening but not definite flow reversal in the pulmonary vein systolic doppler pattern. He was evaluated by the Structural Heart Team. Given severe mitral annular calcification, his anatomy was not felt suitable for clip.   - treatment w/ be medical management w/ HF/ Afib optimization and afterload reduction. Volume status good on exam and by ReDs today. SBPs in the low 90s. Afib is rate controlled.   3.  CAD: s/p CABG and Redo CABG. LHC 06/2014 showed significant coronary disease w/ no good interventional targets (LIMA-LAD  patent with 40-50% stenosis in LAD after touchdown, sequential SVG-ramus/OM/diagonal with only the ramus branch still intact (known from prior cath), SVG-PDA from original surgery TO, SVG-PDA from redo surgery with long 50-60% mid-graft stenosis). NSTEMI 9/23, repeat LHC showed occlusion of the 2nd SVG-PDA and 99% stenosis in the SVG-ramus.  Probably not much benefit to intervention on SVG-ramus.  Small territory covered and complex disease in the SVG.  Stable w/o CP  - Now off ASA with warfarin use. - Continue atorvastatin  20 mg daily + Zetia .  - Needs to take Repatha , he had been off but just got a dose to restart   - Check lipids next visit in 1 month  4. Atrial fibrillation: Now permanent. Seen by EP, not thought to be good ablation candidate. He is well rate controlled, HR in the 50s - Continue warfarin. INR followed by coumadin  clinic. Check CBC today    - Continue Toprol  XL 50 mg bid  4. Bioprosthetic AVR: Normal function on 5/24 echo. Normal function on TEE 4/25  5. CKD stage 3: B/l SCr ~1.3 - check BMP today  - off SGLT2i given h/o UTIs  6. Type 2DM:  - Hgb A1c 7.0  (4/25)  - off SLGT2i given UTIs.  - managed by PCP 7. PAD:  AAA s/p EVR, LE PAD + Carotid Artery Disease s/p Rt CEA. No current claudication symptoms.  - Followed by Dr. Charlotte Cookey. - Continue lipid lowering agents per above. Check LP next visit   F/u w/ Dr. Mitzie Anda in [place] location in 3-4 wks.   Ruddy Corral, PA-C  04/04/2024

## 2024-04-04 NOTE — Patient Instructions (Addendum)
 RedsClip done today.  Labs done today. We will contact you only if your labs are abnormal.  No medication changes were made. Please continue all current medications as prescribed.  Your physician recommends that you schedule a follow-up appointment in: 3-4 weeks with Dr. Mitzie Anda in [facility]  If you have any questions or concerns before your next appointment please send us  a message through [facility] or call our office at [phone].    TO LEAVE A MESSAGE FOR THE NURSE SELECT OPTION 2, PLEASE LEAVE A MESSAGE INCLUDING: YOUR NAME DATE OF BIRTH CALL BACK NUMBER REASON FOR CALL**this is important as we prioritize the call backs  YOU WILL RECEIVE A CALL BACK THE SAME DAY AS LONG AS YOU CALL BEFORE 4:00 PM   Do the following things EVERYDAY: Weigh yourself in the morning before breakfast. Write it down and keep it in a log. Take your medicines as prescribed Eat low salt foods--Limit salt (sodium) to 2000 mg per day.  Stay as active as you can everyday Limit all fluids for the day to less than 2 liters   At the [facility], you and your health needs are our priority. As part of our continuing mission to provide you with exceptional heart care, we have created designated Provider Care Teams. These Care Teams include your primary Cardiologist (physician) and Advanced Practice Providers (APPs- Physician Assistants and Nurse Practitioners) who all work together to provide you with the care you need, when you need it.   You may see any of the following providers on your designated Care Team at your next follow up: Dr Jules Oar Dr Peder Bourdon Dr. Mimi Alt, NP Ruddy Corral, [place] [facility] [place], [place] Dennise Fitz, NP Luster Salters, PharmD   Please be sure to bring in all your medications bottles to every appointment.    Thank you for choosing [facility]

## 2024-04-04 NOTE — Telephone Encounter (Signed)
 The patient's images were reviewed today in the Valve Team meeting. Due to significant calcifications on posterior leaflet, the patient is not a candidate for mTEER.

## 2024-04-05 ENCOUNTER — Encounter: Payer: Self-pay | Admitting: Family Medicine

## 2024-04-05 ENCOUNTER — Ambulatory Visit (INDEPENDENT_AMBULATORY_CARE_PROVIDER_SITE_OTHER): Admitting: Family Medicine

## 2024-04-05 VITALS — BP 100/60 | HR 51 | Temp 97.4°F | Ht 70.0 in | Wt 158.4 lb

## 2024-04-05 DIAGNOSIS — I10 Essential (primary) hypertension: Secondary | ICD-10-CM

## 2024-04-05 DIAGNOSIS — I4891 Unspecified atrial fibrillation: Secondary | ICD-10-CM | POA: Diagnosis not present

## 2024-04-05 DIAGNOSIS — E118 Type 2 diabetes mellitus with unspecified complications: Secondary | ICD-10-CM

## 2024-04-05 DIAGNOSIS — I5042 Chronic combined systolic (congestive) and diastolic (congestive) heart failure: Secondary | ICD-10-CM | POA: Diagnosis not present

## 2024-04-05 DIAGNOSIS — E785 Hyperlipidemia, unspecified: Secondary | ICD-10-CM | POA: Diagnosis not present

## 2024-04-05 NOTE — Progress Notes (Signed)
 Phone [phone] In person visit   Subjective:   Nathaniel [name]. is a 89 y.o. year old very pleasant male patient who presents for/with See problem oriented charting Chief Complaint  Patient presents with   hosp f/u    Pt here for hosp f/u due to afib. DEE scheduled, but does not remember when its scheduled.    Past Medical History-  Patient Active Problem List   Diagnosis Date Noted   Controlled type 2 diabetes mellitus with complication, without long-term current use of insulin  (HCC) 02/19/2022    Priority: High   Afib (HCC) 06/12/2019    Priority: High   Chronic combined systolic and diastolic heart failure (HCC) 06/12/2014    Priority: High   CAD (coronary artery disease) of artery bypass graft 09/14/2012    Priority: High   Carotid artery disease without cerebral infarction (HCC) 09/14/2012    Priority: High   History of aortic valve replacement with bioprosthetic valve 09/14/2012    Priority: High   Aortic atherosclerosis (HCC) 02/19/2022    Priority: Medium    Chronic gout without tophus 02/19/2022    Priority: Medium    BPH (benign prostatic hyperplasia) 10/11/2021    Priority: Medium    Asymptomatic carotid artery stenosis without infarction, right 02/02/2018    Priority: Medium    Solitary pulmonary nodule 03/23/2016    Priority: Medium    Pulmonary nodule 12/25/2015    Priority: Medium    AAA (abdominal aortic aneurysm) without rupture (HCC) 04/13/2014    Priority: Medium    Occlusion and stenosis of carotid artery without mention of cerebral infarction 04/09/2014    Priority: Medium    Abdominal aortic aneurysm (HCC) 09/14/2012    Priority: Medium    Peripheral vascular disease with claudication (HCC) 09/14/2012    Priority: Medium    Essential hypertension 09/14/2012    Priority: Medium    Hyperlipidemia with target LDL less than 70 09/14/2012    Priority: Medium    Secondary hypercoagulable state (HCC) 02/19/2022    Priority: Low    Rhabdomyolysis 10/10/2021    Priority: Low   Aspiration of foreign body     Priority: Low   Foreign body aspiration 12/23/2015    Priority: Low   Encounter for therapeutic drug monitoring 03/28/2015    Priority: Low   Nonrheumatic mitral valve regurgitation 03/22/2024   Pressure injury of skin 03/22/2024   Acute on chronic systolic CHF (congestive heart failure) (HCC) 03/21/2024   NSTEMI (non-ST elevated myocardial infarction) (HCC) 08/20/2022   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) 04/01/2022    Medications- reviewed and updated Current Outpatient Medications  Medication Sig Dispense Refill   atorvastatin  (LIPITOR) 20 MG tablet Take 1 tablet (20 mg total) by mouth daily. Alternating with 30mg  (Patient taking differently: Take 20 mg by mouth daily.) 30 tablet 5   Coenzyme Q10 (CO Q10) 200 MG CAPS Take 200 mg by mouth daily.     digoxin  (LANOXIN ) 0.125 MG tablet Take 1 tablet (0.125 mg total) by mouth daily. 90 tablet 3   dorzolamide -timolol  (COSOPT ) 22.3-6.8 MG/ML ophthalmic solution Place 1 drop into both eyes 2 (two) times daily.     ezetimibe  (ZETIA ) 10 MG tablet TAKE ONE TABLET BY MOUTH AT BEDTIME 90 tablet 3   latanoprost  (XALATAN ) 0.005 % ophthalmic solution Place 1 drop into both eyes at bedtime.     METAMUCIL FIBER PO Take 1 Dose by mouth as needed (constipation).     metoprolol   succinate (TOPROL -XL) 50 MG 24 hr tablet Take 1 tablet (50 mg total) by mouth in the morning and at bedtime. Take with or immediately following a meal. 60 tablet 3   Misc Natural Products (GLUCOSAMINE CHONDROITIN TRIPLE) TABS Take 1 tablet by mouth daily.     potassium chloride  SA (KLOR-CON  M) 20 MEQ tablet Take 2 tablets (40 mEq total) by mouth 2 (two) times daily. (Patient taking differently: Take 20 mEq by mouth 2 (two) times daily.)     REPATHA  SURECLICK 140 MG/ML SOAJ INJECT 1 PEN INTO SKIN EVERY 14 DAYS 2 mL 11   spironolactone  (ALDACTONE ) 25 MG tablet TAKE ONE TABLET BY MOUTH EVERY DAY  30 tablet 5   tamsulosin  (FLOMAX ) 0.4 MG CAPS capsule TAKE ONE CAPSULE BY MOUTH TWICE DAILY 180 capsule 3   torsemide  (DEMADEX ) 20 MG tablet Take 3 tablets (60 mg total) by mouth 2 (two) times daily. 200 tablet 5   warfarin (COUMADIN ) 3 MG tablet Take 9 mg (3 tablets) daily TAKE THREE TABLETS DAILY OR AS DIRECTED BY COUMADIN  CLINIC 90 tablet 0   No current facility-administered medications for this visit.     Objective:  BP 100/60   Pulse (!) 51   Temp (!) 97.4 F (36.3 C)   Ht 5\' 10"  (1.778 m)   Wt 158 lb 6.4 oz (71.8 kg)   SpO2 95%   BMI 22.73 kg/m  Gen: NAD, resting comfortably CV: irregularly irregular and mildly bradycardic Lungs: CTAB no crackles, wheeze, rhonchi- other than perhaps mildly decreased breath sounds right lung base Abdomen: soft/nontender/nondistended/normal bowel sounds. No rebound or guarding.  Ext: no edema Skin: warm, dry     Assessment and Plan   #Social update- still working some managing finances -just celebrated 89th birthday including greatgrandchildren  # Constipation-this seems to be his greatest concern today S:is noting very large volume and wide stools that are causing discomfort.  He is taking metamucil. He is also taking a phillipis medication but not sure which one. Was only going ever 4-5 days for some time.  -just started medicines in last 2-4 days A/P: constipation poor control-  Go home and confirm you are on the following: -Metamucil or similar fiber supplement -docusate/colace- may be called phillipis stool softener- I think you are taking this once a day- hopefully within a few days stools soften and if not can go to twice a day  -let me know any other medications you are taking for this      # Hospital follow-up -Patient was admitted from 03/21/2024 to 03/24/2024 after right heart cath due to elevated filling pressures-she was started on Lasix  drip with intermittent metolazone  dosing.  He was diuresed 18 pounds.  He underwent TEE  for further evaluation of his known severe mitral regurgitation which showed ejection fraction 35% and ongoing severe mitral regurgitation with significant calcification/restrictive leaflets-he was ultimately deemed not a candidate for Mitral Transcatheter Edge-to-Edge Repair due to his risks close patient was not interested in this -On discharge he was to resume torsemide  60 mg twice daily with 40 mEq of potassium twice daily.  Spironolactone  25 mg was also continued for diuresis -Ramipril  was held on discharge due to softer blood pressure but metoprolol  50 mg extended release twice daily was continued for rate control of A-fib-there was a plan to consider restarting by next visit potentially -Digoxin  0.125  mg daily was also continued -His discharge weight was 153.9 pounds -He saw cardiology yesterday April 04, 2024-kidney function was stable and  potassium was adequately repleted.  BNP was trending down from peak of nearly 1100 to 719 on most recent check.  Digoxin  level was slightly low - cardiology has not made change yet as trending up  Today - weight is up 2 lbs compared to yesterday here but at home starting to go back up -he reports only on lasix  60 mg in morning and 40 mg in evening -just started Repatha  back  #CHF-systolic and diastolic mild-follows with Dr. Mitzie Anda #hypertension S: medication: spironolactone  25 mg daily, metoprolol  50 mg XR twice daily, ramipril  2.5 mg BID and torsemide  60 mg in morning and 40 mg in the evening -Reports home weight is gradually increasing A/P: CHF-weight seems to be trending up but no increased edema-cardiology had recommended torsemide  60 mg twice daily and I reinforced this as well as reached out to cardiology for confirmation.  Continue spironolactone .  Hold ramipril  as we increase the torsemide  but hoping to restart in the future Hypertension -overtreated with above medications-hold ramipril  to allow space for increasing torsemide  -Also continue  digoxin   #CAD- CABG and redo CABG. Cath 08/21/22- still 99% stenosis in the SVG- ramus -also has bioprosthetic aortic valve replacement history #hyperlipidemia/aortic atherosclerosis S: Medication:Zetia  10 mg at bedtime,  atorvastatin  20 mg and 30 mg on alternating days (coQ10), also prescribed repatha - has a hard time mentally self injecting- and #s go up  Not on aspirin  as a result of being on coumadin  Lab Results  Component Value Date   CHOL 164 07/06/2023   HDL 39.30 07/06/2023   LDLCALC 92 07/06/2023   LDLDIRECT 98 05/08/2009   TRIG 167.0 (H) 07/06/2023   CHOLHDL 4 07/06/2023   A/P: CAD-no reported chest pain-continue current medication  Cholesterol-has been above goal-he reports difficulty remembering the Repatha  (but does take statin and Zetia ) and I encouraged consistency especially with known elevated lipoprotein a  # Persistent atrial fibrillation-sees Dr. Lawana Pray S:medication:  metoprolol  50 mg XL twice daily-also on digoxin , coumadin  - had been managed by cardiology clinic A/P: appropriately anticoagulated and rate controlled- continue current medicine    # Diabetes- prior followed by Dr. Christobal Craft A1C- 05/20/2021- 7.0 S: Medication:none  -UTI on jardiance Lab Results  Component Value Date   HGBA1C 7.0 (H) 03/21/2024   HGBA1C 7.4 (H) 07/06/2023   HGBA1C 7.7 (H) 01/08/2023   A/P: Improved on most recent check-continue current medication   # Prior abnormal chest CT we did not discuss this today but we need to go over his most recent chest CT at follow-up visit in 3 months-wanted to let current situation stabilized for him-in general he is wanted to minimize interventions for instance not following up with urology for gross hematuria despite risks in the past  Recommended follow up: Return in about 3 months (around 07/05/2024) for followup or sooner if needed.Schedule b4 you leave. Future Appointments  Date Time Provider Department Center  04/10/2024 11:15 AM [facility]  05/01/2024  2:00 PM Darlis Eisenmenger, MD [facility] None  07/06/2024  3:20 PM Almira Jaeger, MD [facility]  07/11/2024 10:15 AM [facility] ANNUAL WELLNESS VISIT 1 [facility]    Lab/Order associations:   ICD-10-CM   1. Atrial fibrillation, unspecified type (HCC)  I48.91     2. Chronic combined systolic and diastolic heart failure (HCC)  Z61.09     3. Controlled type 2 diabetes mellitus with complication, without long-term current use of insulin  (HCC)  E11.8     4. Essential hypertension  I10  5. Hyperlipidemia with target LDL less than 70  E78.5       Time Spent: 40 minutes of total time (11:10 AM- 11:50 AM) was spent on the date of the encounter performing the following actions: chart review prior to seeing the patient, obtaining history, performing a medically necessary exam, counseling on the treatment plan as well as reviewing cardiology plan specifically and reaching out to them, and documenting in our EHR.     Return precautions advised.  Clarisa Crooked, MD

## 2024-04-05 NOTE — Patient Instructions (Addendum)
 Cardiology wanted you on torsemide  60 mg which should be 3 tablets of 20 mg twice a day- lets try this instead of 60 in morning and 40 mg in afternoon to keep weight stable -hold ramipril  for now  Go home and confirm you are on the following: -Metamucil or similar fiber supplement -docusate/colace- may be called phillipis stool softener- I think you are taking this once a day- hopefully within a few days stools soften and if not can go to twice a day  -let me know any other medications you are taking for this  Recommended follow up: Return in about 3 months (around 07/05/2024) for followup or sooner if needed.Schedule b4 you leave.

## 2024-04-07 ENCOUNTER — Telehealth (HOSPITAL_COMMUNITY): Payer: Self-pay | Admitting: *Deleted

## 2024-04-07 ENCOUNTER — Telehealth: Payer: Self-pay | Admitting: *Deleted

## 2024-04-07 ENCOUNTER — Telehealth: Payer: Self-pay

## 2024-04-07 NOTE — Telephone Encounter (Signed)
Attempted to call pt, n/a

## 2024-04-07 NOTE — Telephone Encounter (Signed)
 Called pt and left a message regarding our new location for his upcoming appt on 04/10/24 at 1115am; there was no answer so left a message regarding this.

## 2024-04-07 NOTE — Telephone Encounter (Signed)
-----   Message from [place] sent at 04/05/2024  7:59 AM EDT ----- BMP stable. Dig level slightly elevated. Repeat Digoxin  trough level ----- Message ----- From: Charlette Console, FNP Sent: 04/05/2024   7:55 AM EDT To: Brittainy Tedd Favorite, PA-C

## 2024-04-07 NOTE — Telephone Encounter (Signed)
 Copied from CRM [id number]. Topic: Clinical - Medication Question >> Apr 07, 2024  9:41 AM Luane Rumps D wrote: Reason for CRM: During previous appointment Mr. Leh mentioned to Dr. Arlene Ben that his daughter recommended him a medication to take but he didn't know the name of it at the time, he would like to let him know that the medication is called 'equate'.  Noted in chart for PCP, nothing further needed at this time

## 2024-04-10 ENCOUNTER — Ambulatory Visit: Attending: Cardiology

## 2024-04-10 DIAGNOSIS — Z5181 Encounter for therapeutic drug level monitoring: Secondary | ICD-10-CM | POA: Diagnosis not present

## 2024-04-10 LAB — POCT INR: INR: 4.5 — AB (ref 2.0–3.0)

## 2024-04-10 NOTE — Patient Instructions (Signed)
 Hold today only then  continue on same dosage of Warfarin 3 tablets daily.  Recheck INR in 2 weeks Pt aware of risks. Stay consistent with greens each week.  [facility]  Clinic [phone].

## 2024-04-13 NOTE — Telephone Encounter (Signed)
 Pt aware Will repeat at upcoming follow up-appt notes updated

## 2024-04-26 ENCOUNTER — Ambulatory Visit

## 2024-04-27 ENCOUNTER — Ambulatory Visit: Attending: Cardiology

## 2024-04-27 DIAGNOSIS — Z5181 Encounter for therapeutic drug level monitoring: Secondary | ICD-10-CM | POA: Insufficient documentation

## 2024-04-27 LAB — POCT INR: INR: 1.8 — AB (ref 2.0–3.0)

## 2024-04-27 NOTE — Patient Instructions (Signed)
 Description   Take 3.5 tablets today and then continue on same dosage of Warfarin 3 tablets daily.  Recheck INR in 2 weeks Pt aware of risks. Stay consistent with greens each week.  [facility]  Clinic [phone].

## 2024-04-28 ENCOUNTER — Telehealth (HOSPITAL_COMMUNITY): Payer: Self-pay | Admitting: Cardiology

## 2024-04-28 NOTE — Telephone Encounter (Signed)
 Called to confirm/remind patient of their appointment [facility] on 04/28/2024.   Appointment:   [x] Confirmed  [] Left mess   [] No answer/No voice mail  [] VM Full/unable to leave message  [] Phone not in service  Patient reminded to bring all medications and/or complete list.  Confirmed patient has transportation. Gave directions, instructed to utilize valet parking.

## 2024-05-01 ENCOUNTER — Ambulatory Visit (HOSPITAL_COMMUNITY)
Admission: RE | Admit: 2024-05-01 | Discharge: 2024-05-01 | Disposition: A | Discharge status: Home / Self Care | Source: Ambulatory Visit | Attending: Cardiology | Admitting: Cardiology

## 2024-05-01 ENCOUNTER — Ambulatory Visit (HOSPITAL_COMMUNITY): Payer: Self-pay | Admitting: Cardiology

## 2024-05-01 ENCOUNTER — Other Ambulatory Visit (HOSPITAL_COMMUNITY): Payer: Self-pay

## 2024-05-01 ENCOUNTER — Encounter (HOSPITAL_COMMUNITY): Payer: Self-pay | Admitting: Cardiology

## 2024-05-01 ENCOUNTER — Telehealth (HOSPITAL_COMMUNITY): Payer: Self-pay

## 2024-05-01 VITALS — BP 110/68 | HR 71 | Wt 156.0 lb

## 2024-05-01 DIAGNOSIS — Z7984 Long term (current) use of oral hypoglycemic drugs: Secondary | ICD-10-CM | POA: Diagnosis not present

## 2024-05-01 DIAGNOSIS — I4821 Permanent atrial fibrillation: Secondary | ICD-10-CM | POA: Insufficient documentation

## 2024-05-01 DIAGNOSIS — Z7901 Long term (current) use of anticoagulants: Secondary | ICD-10-CM | POA: Diagnosis not present

## 2024-05-01 DIAGNOSIS — I340 Nonrheumatic mitral (valve) insufficiency: Secondary | ICD-10-CM | POA: Insufficient documentation

## 2024-05-01 DIAGNOSIS — I110 Hypertensive heart disease with heart failure: Secondary | ICD-10-CM | POA: Insufficient documentation

## 2024-05-01 DIAGNOSIS — I255 Ischemic cardiomyopathy: Secondary | ICD-10-CM | POA: Diagnosis not present

## 2024-05-01 DIAGNOSIS — E1151 Type 2 diabetes mellitus with diabetic peripheral angiopathy without gangrene: Secondary | ICD-10-CM | POA: Diagnosis not present

## 2024-05-01 DIAGNOSIS — Z8679 Personal history of other diseases of the circulatory system: Secondary | ICD-10-CM | POA: Diagnosis not present

## 2024-05-01 DIAGNOSIS — I2510 Atherosclerotic heart disease of native coronary artery without angina pectoris: Secondary | ICD-10-CM | POA: Diagnosis not present

## 2024-05-01 DIAGNOSIS — I272 Pulmonary hypertension, unspecified: Secondary | ICD-10-CM | POA: Diagnosis not present

## 2024-05-01 DIAGNOSIS — Z8774 Personal history of (corrected) congenital malformations of heart and circulatory system: Secondary | ICD-10-CM | POA: Diagnosis not present

## 2024-05-01 DIAGNOSIS — I5022 Chronic systolic (congestive) heart failure: Secondary | ICD-10-CM | POA: Insufficient documentation

## 2024-05-01 DIAGNOSIS — I5042 Chronic combined systolic (congestive) and diastolic (congestive) heart failure: Secondary | ICD-10-CM

## 2024-05-01 DIAGNOSIS — Z79899 Other long term (current) drug therapy: Secondary | ICD-10-CM | POA: Insufficient documentation

## 2024-05-01 DIAGNOSIS — Z951 Presence of aortocoronary bypass graft: Secondary | ICD-10-CM | POA: Diagnosis not present

## 2024-05-01 LAB — BRAIN NATRIURETIC PEPTIDE: B Natriuretic Peptide: 397.6 pg/mL — ABNORMAL HIGH (ref 0.0–100.0)

## 2024-05-01 LAB — BASIC METABOLIC PANEL WITH GFR
Anion gap: 13 (ref 5–15)
BUN: 26 mg/dL — ABNORMAL HIGH (ref 8–23)
CO2: 27 mmol/L (ref 22–32)
Calcium: 9.7 mg/dL (ref 8.9–10.3)
Chloride: 97 mmol/L — ABNORMAL LOW (ref 98–111)
Creatinine, Ser: 1.46 mg/dL — ABNORMAL HIGH (ref 0.61–1.24)
GFR, Estimated: 45 mL/min — ABNORMAL LOW (ref >60)
Glucose, Bld: 137 mg/dL — ABNORMAL HIGH (ref 70–99)
Potassium: 4.8 mmol/L (ref 3.5–5.1)
Sodium: 137 mmol/L (ref 135–145)

## 2024-05-01 LAB — LIPID PANEL
Cholesterol: 160 mg/dL (ref 0–200)
HDL: 40 mg/dL — ABNORMAL LOW (ref >40)
LDL Cholesterol: 92 mg/dL (ref 0–99)
Total CHOL/HDL Ratio: 4 ratio
Triglycerides: 141 mg/dL (ref <150)
VLDL: 28 mg/dL (ref 0–40)

## 2024-05-01 LAB — DIGOXIN LEVEL: Digoxin Level: 0.6 ng/mL — ABNORMAL LOW (ref 0.8–2.0)

## 2024-05-01 MED ORDER — JARDIANCE 10 MG PO TABS: 10.0000 mg | ORAL_TABLET | Freq: Every day | ORAL | 11 refills | Status: DC

## 2024-05-01 NOTE — Progress Notes (Signed)
 ADVANCED HF CLINIC NOTE                                                                                                                                                                                                                                                                                                                                                                                                                                                                                                   Patient ID: Nathaniel Halfmann., male   DOB: 1932-02-11, 89 y.o.   MRN: [id number] PCP: Nathaniel [name] Cardiology: Dr. Mitzie [name]  Chief complaint: CHF  89 y.o. with history of extensive vascular disease including CAD s/p CABG and redo CABG, AAA s/p endovascular repair in 2015, PAD, and carotid stenosis as well as prior AVR.                             After his AAA repair, he was noted to be in atrial fibrillation.  He had had a prior episode of atrial fibrillation after his cardiac  surgery in 2007.  Atrial fibrillation was rate-controlled.  He was started on warfarin and Toprol  XL.  He developed exertional dyspnea and fatigue and was cardioverted back to SR 06/06/14.  Echo 06/15 EF worsened to 25-30% with stable bioprosthetic aortic valve, mildly dilated RV with normal systolic function.  LHC 07/15 showed stable anatomy.  The LIMA-LAD was patent, the sequential SVG-OM/ramus/D was patent only to ramus but this is the same as the prior cath, and the SVG-PDA was patent with a long 50-60% mid-graft stenosis.  No intervention.  Of note, at cath he was noted to be back in atrial fibrillation.  He was started on amiodarone  and had DCCV again on 08/21/14.  This was successful.  He has been off amiodarone  due to side effects (hallucinations).  Echo in 1/16 showed recovery of EF to 55-60%.    He was admitted in 1/19 with bronchitis/wheezing/dyspnea and was found to be in atrial fibrillation with RVR.  He tested  positive for metapneumovirus.  Echo showed EF 45-50% with normal bioprosthetic aortic valve. He was diuresed for volume overload while in the hospital. He had DCCV to NSR.    Carotid dopplers in 1/19 showed 80-99% RICA stenosis, he had right CEA in 2/19 with no complications.   At appointment in early 6/20, Nathaniel [name] was noted to be back in atrial fibrillation with RVR and mild CHF.  He was started on Toprol  XL and Lasix  was increased.  He had TEE-guided DCCV to NSR in 6/20.  TEE showed EF 50%, mild diffuse hypokinesis, mildly decreased RV systolic function, normally functioning bioprosthetic aortic valve.   In 2021, he was admitted for Tikosyn  initiation.  In 12/21, he went into atrial fibrillation again and was cardioverted back to NSR.   Echo in 1/22 showed EF 45-50%, diffuse hypokinesis, mildly decreased RV systolic function, moderate biatrial enlargement, bioprosthetic valve with mean gradient 8 mmHg.    In 2/22, he went into atrial fibrillation and had TEE-guided DCCV back to NSR.  TEE showed EF 45-50%.    In 3/22, he was back in the ER with atrial fibrillation triggered by [name] most likely.  He had a cardioversion back to NSR.   In 10/22, patient developed urinary retention and an E coli UTI.  In the setting of this, he had a fall at home and was unable to get up due to weakness for hours.  He developed rhabdomyolysis.  This resolved with minimal renal damage with IV fluid hydration and he was discharged to rehab.   Follow up 2/23 he was in atrial fibrillation, TEE/DCCV arranged however INR subtherapeutic. He cancelled rescheduled procedure due to a death in the family.  Follow up 3/23. Remained in AF. Volume overloaded. Lasix  increased for 3 days. Underwent TEE guided DCCV on 03/19/22.  TEE showed EF 50-55%, mildly decreased RV function, normal bioprosthetic aortic valve, mild Nathaniel and mild mitral stenosis (mean gradient 3 mmHg).    Recently diagnosed with stage IA2 NSCLC RUL. Saw  Radiation Oncology and Pulmonary and decided on empiric treatment with SBRT.  He has completed radiation.   Admitted 9/23 with NSTEMI and AF with RVR. Underwent R/LHC showing normal filling pressures, known occlusion of native coronaries and SVG-RCA from original CABG. New occlusion of SVG-PDA from CABG#2. Long 99% stenosis in the SVG-ramus. Ramus is relatively small vessel. Patent LIMA-LAD with collaterals to RCA territory. Case was discussed with Nathaniel [name], wouldn't benefit from intervention, medically managed. Patient declined re-challenge of amiodarone  and was able to have  successful TEE/DCCV to NSR. TEE showed EF 45% with normal RV, mild-moderate Nathaniel, normal bioprosthetic aortic valve, no LA appendage thrombus. Discharged home, weight 184 lbs.  Follow up 9/23, back in atrial fibrillation with HR 116. Toprol  increased to 25 bid and arranged for TEE/DCCV.  TEE/DCCV (10/23) showed EF 45%, diffuse HK, RV mildly decreased, moderate Nathaniel, Bioprosthetic AoV stable with mean gradient 6 mmHg, no LAA. Proceeded with successful DCCV to NSR.  However, later in 10/23 he was back in AF and has been in AF since that time.   Echo in 5/24 showed EF lower at 30-35%, moderate LV dilation, mild RV dysfunction, moderate-severe Nathaniel, bioprosthetic aortic valve appeared normal, IVC normal.   He was arranged for outpatient RHC done on 03/21/24 which demonstrated elevated right and left heart filling pressures, moderate mixed pulmonary arterial/pulmonary venous hypertension, prominent V-waves in PCWP tracing concerning for significant Nathaniel, as well as low CI and low PAPi (RA 15, PCWP 24 w/ V waves up to 35, PA sat 58%, FICK CI 2.07, PAPi 1.7, PVR 3.8 WU).  He was direct admitted for IV diuretics and further investigation of Nathaniel. Placed on Lasix  gtt. PICC was placed. Co-ox remained stable, did not require inotropic support. He diuresed well on lasix  gtt and transitioned back to torsemide  at 60 mg bid + HF GDMT. TEE was done to  further investigate the mitral valve. Study showed EF 35%, mild LV dilation, mildly dilated RV with mildly decreased systolic function, moderate left atrial enlargement, normally functioning AVR. There was moderate mitral annular calcification with severe mitral regurgitation with a calcified and restricted posterior leaflet. There were 2 jets of mitral regurgitation, PISA ERO 0.21 cm^2 when applied to each jet, so total PISA ERO 0.42 cm^2. MV mean gradient 2 mmHg.  There was flattening but not definite flow reversal in the pulmonary vein systolic doppler pattern. Structural heart team was consulted for consideration for mTEER. Given severe mitral annular calcification, his anatomy was not felt suitable for clip.    He returns today for followup of CHF.  No chest pain.  Using a cane now, has dyspnea with walking a long distance but does fine around the house.  Weight is stable.  He feels like his volume is well-controlled. No lightheadedness.  No orthopnea/PND.  No palpitations.   ECG: atrial fibrillation, IVCD 142 msec   Labs (1/19): K 4.9, creatinine 1.19 Labs (2/19): K 4.2, creatinine 0.89 Labs (3/19): LDL 86, HDL 45, TGs 245 Labs (5/19): LDL 110, HDL 39 Labs (6/19): K 4.1, creatinine 1.07 Labs (11/19): LDL 14, HDL 56 Labs (6/20): K 4.1, creatinine 1.0 Labs (7/20): K 4.3, creatinine 1.07 Labs (9/21): LDL 129, hgb 14.8, K 4.1, creatinine 1.02 Labs (12/21): LDL 20, HDL 46, K 4.4, creatinine 1.91 Labs (1/22): LDL 45, HDL 49, K 4.3, creatinine 1.1, BNP 184 Labs (3/22): K 4.4, creatinine 1.10, LDL 15, HDL 49 Labs (6/22): K 4.3, creatinine 1.11, LDL 121 Labs (7/22): K 4.3, creatinine 1.23, hgb 14.5 Labs (11/22): K 4.5, creatinine 1.14 Labs (2/23): K 4.3, creatinine 1.16 Labs (7/23): K 4.4, creatinine 1.24 Labs (9/23): K 4.1, creatinine 1.19 Labs (10/23): K 4.2, creatinine 1.31 Labs (1/24): K 4.8, SCr 1.13, Hgb A1c 7.7 Labs (4/24): K 5, creatinine 1.56 => 1.33, BNP 726 Labs (5/24): K 4.7, SCr  1.29 BNP 895 Labs (5/24): K 4.9, SCr 2.14, BNP 398 Labs (6/24): K 4.6, SCr 1.66  Labs (7/24): LDL 92 Labs (11/24): K 4, creatinine 1.34, pro-BNP 5110 Labs (4/25): K  3.9, creatinine 1.52 => 1.36, BNP 719, digoxin  level 0.7  PMH: 1. PAD: Peripheral arterial dopplers in 2012 showed > 75% bilateral SFA stenosis. Peripheral arterial dopplers in 10/14 showed > 50% focal bilateral SFA stenoses.  2. AAA: US  4/15 with 4.4 cm AAA but concern for penetrating ulcers. CTA abdomen showed 4.4 cm AAA with penetrating ulcers and possible pseudoaneurysms. Now s/p endovascular repair of AAA in 5/15 with no complications.  3. Carotid stenosis: Carotid dopplers (4/15) with 60-79% bilateral ICA stenosis. Carotid dopplers (11/15) with 60-79% bilateral ICA stenosis.  Carotid dopplers 6/16 with 60-79% bilateral ICA stenosis. - Carotid dopplers (1/18): 60-79% RICA stenosis.  - Carotid dopplers (1/19): 80-99% RICA stenosis => Right CEA in 2/19.  - Carotid dopplers (11/19): 40-59% bilateral stenosis.  - Carotid dopplers (1/21): 1-39% BICA stenosis.  4. CAD: CABG 1989 with LIMA-LAD, SVG-D, seq SVG-ramus and OM, sequential SVG-PDA/PLV. SVG-PDA/PLV found to be occluded on cath prior to AVR, so patient had SVG-PDA with AVR in 2007. LHC (7/15) with LIMA-LAD patent with 40-50% stenosis in LAD after touchdown, sequential SVG-ramus/OM/diagonal with only the ramus branch still intact (known from prior cath), SVG-PDA from original surgery TO, SVG-PDA from redo surgery with long 50-60% mid-graft stenosis.  No target for intervention.  - LHC (9/23): known occlusion of native coronaries and SVG-RCA from original CABG, new occlusion of SVG-PDA from CABG#2, long 99% stenosis in the SVG-ramus (was sequential SVG-ramus/OM/D but OM and D branches noted to be lost on prior cath), ramus is relatively small vessel, patent LIMA-LAD with collaterals to RCA territory. Case reviewed with Nathaniel [name], probably not much benefit to intervention on  SVG-ramus. Small territory covered and complex disease in SVG. Medical management advised. 5. Severe aortic stenosis: Bioprosthetic AVR in 2007.  6. Atrial fibrillation: Paroxysmal. Initially noted after cardiac surgery, then again after AAA repair.  DCCV to NSR 06/06/14.  DCCV to NSR again 08/21/14.  - DCCV to NSR in 1/19.  - DCCV to NSR in 6/20. .  - DCCV to NSR 12/21, 2/22, 3/22, 4/23, 9/23, 10/23 - Now permanent, off Tikosyn .  7. Type II diabetes  8. Gout  9. GERD  10. Hyperlipidemia  11. HTN 12. GERD  13. Chronic systolic CHF: Echo (6/15) with EF worsened to 25-30% with grade II diastolic dysfunction, stable bioprosthetic aortic valve, mildly dilated RV with normal systolic function.  Echo (1/16) with EF 55-60%, basal inferior hypokinesis, mild LVH, bioprosthetic aortic valve with mean gradient 13 mmHg, mild Nathaniel, severe LAE.  - Echo (1/19): EF 45-50%, mild Nathaniel, normal bioprosthetic aortic valve.  - TEE (6/20): EF 50% with mild diffuse hypokinesis, mildly decreased RV systolic function with mild RV enlargement, bioprosthetic aortic valve looked ok, mild Nathaniel. - Echo (1/22):  EF 45-50%, diffuse hypokinesis, mildly decreased RV systolic function, moderate biatrial enlargement, bioprosthetic valve with mean gradient 8 mmHg.   - TEE (2/22): EF 45-50%, RV normal, moderate biatrial enlargement, bioprosthetic AoV with mean gradient 6 mmHg, mild Nathaniel.  - TEE (4/23): EF 50-55%, mildly decreased RV function, normal bioprosthetic aortic valve, mild Nathaniel and mild mitral stenosis (mean gradient 3 mmHg) - TEE (9/23): EF 45%, RV normal, no LAA thrombus, no PFO/ASD, mild to moderate Nathaniel w/ moderate MAC, AoV without stenosis/regurgitation - TEE (10/23): EF 45%, RV mildly decreased, no LAA thrombus, no PFO/ASD, moderate Nathaniel, AoV without stenosis/regurgitation, mean gradient 6 mmHg. - Echo (5/24): EF 30-35%, moderate LV dilation, mild RV dysfunction, moderate-severe Nathaniel, bioprosthetic aortic valve appeared normal, IVC  normal.  -  RHC (4/25): mean RA 13, PA 51/26 mean 46, mean PCWP 24, CI 2.07, PVR 3.8 WU, PAPi 1.7 - TEE (4/25): EF 35%, mild LV dilation, mildly dilated RV with mildly decreased systolic function, moderate left atrial enlargement, normally functioning bioprosthetic AVR, severe Nathaniel with restricted mitral leaflets.   14. BPH  15. Nonsmall cell lung cancer: Suspected by imaging, treated in 2023 with radiation.  16. Fall with rhabdomyolysis (10/22)  17. Severe Mitral Regurgitation: TEE (4/25) moderate MAC with severe Nathaniel with a calcified and restricted posterior leaflet. There were 2 jets of mitral regurgitation, PISA ERO 0.21 cm^2 when applied to each jet, so total PISA ERO 0.42 cm^2. MV mean gradient 2 mmHg.  There was flattening but not definite flow reversal in the pulmonary vein systolic doppler pattern. - TEE reviewed by structural heart service, thought not to be good mTEER candidate due to posterior leaflet calcification.   SH: Widower, 2 children, raised his 3 grand-daughters, lives in [place], [place].  Occasional ETOH, no smoking.   FH: Father died from ruptured AAA.   ROS: All systems reviewed and negative except as per HPI.   Current Outpatient Medications  Medication Sig Dispense Refill   atorvastatin  (LIPITOR) 20 MG tablet Take 1 tablet (20 mg total) by mouth daily. Alternating with 30mg  30 tablet 5   Coenzyme Q10 (CO Q10) 200 MG CAPS Take 200 mg by mouth daily.     digoxin  (LANOXIN ) 0.125 MG tablet Take 1 tablet (0.125 mg total) by mouth daily. 90 tablet 3   dorzolamide -timolol  (COSOPT ) 22.3-6.8 MG/ML ophthalmic solution Place 1 drop into both eyes 2 (two) times daily.     ezetimibe  (ZETIA ) 10 MG tablet TAKE ONE TABLET BY MOUTH AT BEDTIME 90 tablet 3   JARDIANCE  10 MG TABS tablet Take 1 tablet (10 mg total) by mouth daily before breakfast. 30 tablet 11   latanoprost  (XALATAN ) 0.005 % ophthalmic solution Place 1 drop into both eyes at bedtime.     METAMUCIL FIBER PO Take 1 Dose  by mouth as needed (constipation).     metoprolol  succinate (TOPROL -XL) 50 MG 24 hr tablet Take 1 tablet (50 mg total) by mouth in the morning and at bedtime. Take with or immediately following a meal. 60 tablet 3   Misc Natural Products (GLUCOSAMINE CHONDROITIN TRIPLE) TABS Take 1 tablet by mouth daily.     potassium chloride  SA (KLOR-CON  M) 20 MEQ tablet Take 60 mEq by mouth 2 (two) times daily. 40 meq in the PM     REPATHA  SURECLICK 140 MG/ML SOAJ INJECT 1 PEN INTO SKIN EVERY 14 DAYS 2 mL 11   spironolactone  (ALDACTONE ) 25 MG tablet TAKE ONE TABLET BY MOUTH EVERY DAY 30 tablet 5   tamsulosin  (FLOMAX ) 0.4 MG CAPS capsule TAKE ONE CAPSULE BY MOUTH TWICE DAILY 180 capsule 3   torsemide  (DEMADEX ) 20 MG tablet Take 60 mg by mouth 2 (two) times daily. 2 tablets in the PM     warfarin (COUMADIN ) 3 MG tablet Take 9 mg (3 tablets) daily TAKE THREE TABLETS DAILY OR AS DIRECTED BY COUMADIN  CLINIC 90 tablet 0   No current facility-administered medications for this encounter.   BP 110/68   Pulse 71   Wt 70.8 kg (156 lb)   SpO2 94%   BMI 22.38 kg/m    Wt Readings from Last 3 Encounters:  05/01/24 70.8 kg (156 lb)  04/05/24 71.8 kg (158 lb 6.4 oz)  04/04/24 70.9 kg (156 lb 3.2 oz)   PHYSICAL EXAM:  General: NAD Neck: No JVD, no thyromegaly or thyroid  nodule.  Lungs: Clear to auscultation bilaterally with normal respiratory effort. CV: Nondisplaced PMI.  Heart irregular S1/S2, no S3/S4, 2/6 HSM apex.  1+ ankle edema.  No carotid bruit.  Normal pedal pulses.  Abdomen: Soft, nontender, no hepatosplenomegaly, no distention.  Skin: Intact without lesions or rashes.  Neurologic: Alert and oriented x 3.  Psych: Normal affect. Extremities: No clubbing or cyanosis.  HEENT: Normal.   Assessment/Plan:  1.  Chronic Systolic Heart Failure: Ischemic cardiomyopathy.  Echo 9/23 with stable EF 45-50%, LV global hypokinesis, RV systolic function normal. Filling pressures normal on RHC (9/23). TEE in 10/23  showed EF 45%, diffuse HK, RV mildly decreased, moderate Nathaniel, bioprosthetic AoV stable with mean gradient 6 mmHg, no LAA. Echo 5/24 EF 30-35%, RV mildly reduced, Nathaniel moderate-severe. Suspect further drop in EF may be tachycardia-mediated with permanent AF.  TEE in 4/25 showed EF 35%, RV mildly reduced, severe Nathaniel. RHC 4/25 demonstrated elevated right and left heart filling pressures, moderate mixed pulmonary arterial/pulmonary venous hypertension, prominent V-waves in PCWP tracing concerning for significant Nathaniel, as well as low CI and low PAPi.  NYHA class II-III, not significantly volume overloaded on exam and weight is stable.  - Continue torsemide  60 qam/40 qpm.  - I am going to start him back on Jardiance  10 mg daily.  BMET/BNP today, BMET in 10 days.  - Continue Toprol  XL 50 mg bid.  - Continue spironolactone  25 mg daily. - Continue Digoxin  0.125. Check digoxin  level today.  - If creatinine remains stable, may be able to start low dose ARB in future.  BP may not tolerate Entresto.  2. Mitral Regurgitation: prominent V waves on PCWP tracing, up to 35, on 4/25 RHC. Subsequent TEE 4/25 confirmed severe mitral regurgitation with a calcified and restricted posterior leaflet. Mod-severe MAC. There were 2 jets of mitral regurgitation, PISA ERO 0.21 cm^2 when applied to each jet, so total PISA ERO 0.42 cm^2. MV mean gradient 2 mmHg.  There was flattening but not definite flow reversal in the pulmonary vein systolic doppler pattern. He was evaluated by the Structural Heart Team. Given severe mitral annular calcification, his anatomy was not felt suitable for mTEER.    - Medical management.  3.  CAD: s/p CABG and Redo CABG. LHC 06/2014 showed significant coronary disease w/ no good interventional targets (LIMA-LAD patent with 40-50% stenosis in LAD after touchdown, sequential SVG-ramus/OM/diagonal with only the ramus branch still intact (known from prior cath), SVG-PDA from original surgery TO, SVG-PDA from redo  surgery with long 50-60% mid-graft stenosis). NSTEMI 9/23, repeat LHC showed occlusion of the 2nd SVG-PDA and 99% stenosis in the SVG-ramus.  Probably not much benefit to intervention on SVG-ramus.  Small territory covered and complex disease in the SVG.  No chest pain.  - Now off ASA with warfarin use. - Continue atorvastatin  20 mg daily + Zetia  + Repatha .  Check lipids today.  4. Atrial fibrillation: Now permanent. Seen by EP, not thought to be good ablation candidate. He is well rate controlled.  - Continue warfarin. INR followed by coumadin  clinic. - Continue Toprol  XL 50 mg bid  4. Bioprosthetic AVR:  Normal function on TEE 4/25  5. CKD stage 3: B/l SCr ~1.3 - BMET today.  - Restart Jardiance  as above.  6. PAD:  AAA s/p EVR, LE PAD + Carotid Artery Disease s/p Rt CEA. No current claudication symptoms.  - Followed by Dr. Charlotte Cookey. - Continue lipid lowering  agents per above.   Followup with APP in 6 wks.   I spent 32 minutes reviewing records, interviewing/examining patient, and managing orders.    Nathaniel [name],  05/01/2024

## 2024-05-01 NOTE — Patient Instructions (Addendum)
 START Jardiance  10 mg daily.  Labs done today, your results will be available in MyChart, we will contact you for abnormal readings.  Repeat blood work in 10 days.  Your physician recommends that you schedule a follow-up appointment in: 6 weeks.  If you have any questions or concerns before your next appointment please send us  a message through [place] or call our office at [phone].    TO LEAVE A MESSAGE FOR THE NURSE SELECT OPTION 2, PLEASE LEAVE A MESSAGE INCLUDING: YOUR NAME DATE OF BIRTH CALL BACK NUMBER REASON FOR CALL**this is important as we prioritize the call backs  YOU WILL RECEIVE A CALL BACK THE SAME DAY AS LONG AS YOU CALL BEFORE 4:00 PM  At the [facility], you and your health needs are our priority. As part of our continuing mission to provide you with exceptional heart care, we have created designated Provider Care Teams. These Care Teams include your primary Cardiologist (physician) and Advanced Practice Providers (APPs- Physician Assistants and Nurse Practitioners) who all work together to provide you with the care you need, when you need it.   You may see any of the following providers on your designated Care Team at your next follow up: Dr Jules Oar Dr Peder Bourdon Dr. Alwin Baars Dr. Arta Lark Amy Marijane Shoulders, NP Ruddy Corral, [place] [facility] [place], [place] Dennise Fitz, NP [place] Lee, NP Shawnee Dellen, NP Luster Salters, PharmD Bevely Brush, PharmD   Please be sure to bring in all your medications bottles to every appointment.    Thank you for choosing [facility]

## 2024-05-01 NOTE — Telephone Encounter (Signed)
 Advanced Heart Failure Patient Advocate Encounter  Test billing for this patients current coverage returns Jardiance  copay of $45 for 30 days or $135 for 90 days.  Kennis Peacock, CPhT Rx Patient Advocate Phone: [phone]

## 2024-05-04 ENCOUNTER — Other Ambulatory Visit (HOSPITAL_COMMUNITY): Payer: Self-pay

## 2024-05-15 ENCOUNTER — Ambulatory Visit: Attending: Cardiology

## 2024-05-15 DIAGNOSIS — Z5181 Encounter for therapeutic drug level monitoring: Secondary | ICD-10-CM | POA: Diagnosis not present

## 2024-05-15 LAB — POCT INR: INR: 3.2 — AB (ref 2.0–3.0)

## 2024-05-15 NOTE — Patient Instructions (Signed)
 continue on same dosage of Warfarin 3 tablets daily.  Recheck INR in 4 weeks Pt aware of risks. Stay consistent with greens each week.  Coumadin  Clinic [phone].

## 2024-05-16 ENCOUNTER — Ambulatory Visit (HOSPITAL_COMMUNITY)
Admission: RE | Admit: 2024-05-16 | Discharge: 2024-05-16 | Disposition: A | Discharge status: Home / Self Care | Source: Ambulatory Visit | Attending: Cardiology | Admitting: Cardiology

## 2024-05-16 DIAGNOSIS — I5042 Chronic combined systolic (congestive) and diastolic (congestive) heart failure: Secondary | ICD-10-CM | POA: Diagnosis not present

## 2024-05-16 LAB — BASIC METABOLIC PANEL WITH GFR
Anion gap: 13 (ref 5–15)
BUN: 25 mg/dL — ABNORMAL HIGH (ref 8–23)
CO2: 27 mmol/L (ref 22–32)
Calcium: 9.4 mg/dL (ref 8.9–10.3)
Chloride: 95 mmol/L — ABNORMAL LOW (ref 98–111)
Creatinine, Ser: 1.48 mg/dL — ABNORMAL HIGH (ref 0.61–1.24)
GFR, Estimated: 45 mL/min — ABNORMAL LOW (ref >60)
Glucose, Bld: 249 mg/dL — ABNORMAL HIGH (ref 70–99)
Potassium: 4.4 mmol/L (ref 3.5–5.1)
Sodium: 135 mmol/L (ref 135–145)

## 2024-05-17 ENCOUNTER — Telehealth: Payer: Self-pay

## 2024-05-17 ENCOUNTER — Other Ambulatory Visit (HOSPITAL_COMMUNITY): Payer: Self-pay

## 2024-05-17 NOTE — Telephone Encounter (Signed)
 Pharmacy Patient Advocate Encounter   Received notification from Physician's Office that prior authorization for REPATHA  is required/requested.   Insurance verification completed.   The patient is insured through [facility] .   Per test claim: PA required; PA submitted to above mentioned insurance via CoverMyMeds Key/confirmation #/EOC [id number] Status is pending

## 2024-05-17 NOTE — Telephone Encounter (Signed)
 Pharmacy Patient Advocate Encounter  Received notification from [facility] that Prior Authorization for REPATHA  has been APPROVED from 05/17/24 to 05/17/25   Pharmacy filled today.

## 2024-05-17 NOTE — Telephone Encounter (Signed)
-----   Message from Cathalene Clipper sent at 05/16/2024  4:26 PM EDT ----- Please renew PA for Repatha  ----- Message ----- From: Glorietta Lark, RN Sent: 05/16/2024   2:02 PM EDT To: [facility] Pharmd  Pt states he needs auth for Repatha , he said he has been out for about 4 months, can you guys please follow-up, thanks

## 2024-06-02 ENCOUNTER — Other Ambulatory Visit (HOSPITAL_COMMUNITY): Payer: Self-pay | Admitting: Cardiology

## 2024-06-12 ENCOUNTER — Other Ambulatory Visit (HOSPITAL_COMMUNITY): Payer: Self-pay

## 2024-06-12 ENCOUNTER — Telehealth (HOSPITAL_COMMUNITY): Payer: Self-pay

## 2024-06-12 DIAGNOSIS — I5042 Chronic combined systolic (congestive) and diastolic (congestive) heart failure: Secondary | ICD-10-CM

## 2024-06-12 MED ORDER — ATORVASTATIN CALCIUM 20 MG PO TABS: ORAL_TABLET | ORAL | 3 refills | Status: DC

## 2024-06-12 NOTE — Telephone Encounter (Signed)
 Called to confirm/remind patient of their appointment at the [facility] on 06/13/24.   Appointment:   [] Confirmed  [] Left mess   [] No answer/No voice mail  [] VM Full/unable to leave message  [] Phone not in service  And to bring in all medications and/or complete list.

## 2024-06-12 NOTE — Progress Notes (Incomplete)
 ADVANCED HF CLINIC NOTE                                                                                                                                                                                                                                                                                                                                                                                                                                                                                                   Patient ID: Nathaniel Baranek., male   DOB: 1932-11-14, 89 y.o.   MRN: [id number] PCP: Katrinka Garnette KIDD, MD Cardiology: Dr. Rolan  Chief complaint: CHF  89 y.o. with history of extensive vascular disease including CAD s/p CABG and redo CABG, AAA s/p endovascular repair in 2015, PAD, and carotid stenosis as well as prior AVR.                             After his AAA repair, he was noted to be in atrial fibrillation.  He had had a prior episode of atrial fibrillation after his cardiac  surgery in 2007.  Atrial fibrillation was rate-controlled.  He was started on warfarin and Toprol  XL.  He developed exertional dyspnea and fatigue and was cardioverted back to SR 06/06/14.  Echo 06/15 EF worsened to 25-30% with stable bioprosthetic aortic valve, mildly dilated RV with normal systolic function.  LHC 07/15 showed stable anatomy.  The LIMA-LAD was patent, the sequential SVG-OM/ramus/D was patent only to ramus but this is the same as the prior cath, and the SVG-PDA was patent with a long 50-60% mid-graft stenosis.  No intervention.  Of note, at cath he was noted to be back in atrial fibrillation.  He was started on amiodarone  and had DCCV again on 08/21/14.  This was successful.  He has been off amiodarone  due to side effects (hallucinations).  Echo in 1/16 showed recovery of EF to 55-60%.    He was admitted in 1/19 with bronchitis/wheezing/dyspnea and was found to be in atrial fibrillation with RVR.  He tested  positive for metapneumovirus.  Echo showed EF 45-50% with normal bioprosthetic aortic valve. He was diuresed for volume overload while in the hospital. He had DCCV to NSR.    Carotid dopplers in 1/19 showed 80-99% RICA stenosis, he had right CEA in 2/19 with no complications.   At appointment in early 6/20, Nathaniel [name] was noted to be back in atrial fibrillation with RVR and mild CHF.  He was started on Toprol  XL and Lasix  was increased.  He had TEE-guided DCCV to NSR in 6/20.  TEE showed EF 50%, mild diffuse hypokinesis, mildly decreased RV systolic function, normally functioning bioprosthetic aortic valve.   In 2021, he was admitted for Tikosyn  initiation.  In 12/21, he went into atrial fibrillation again and was cardioverted back to NSR.   Echo in 1/22 showed EF 45-50%, diffuse hypokinesis, mildly decreased RV systolic function, moderate biatrial enlargement, bioprosthetic valve with mean gradient 8 mmHg.    In 2/22, he went into atrial fibrillation and had TEE-guided DCCV back to NSR.  TEE showed EF 45-50%.    In 3/22, he was back in the ER with atrial fibrillation triggered by [name] most likely.  He had a cardioversion back to NSR.   In 10/22, patient developed urinary retention and an E coli UTI.  In the setting of this, he had a fall at home and was unable to get up due to weakness for hours.  He developed rhabdomyolysis.  This resolved with minimal renal damage with IV fluid hydration and he was discharged to rehab.   Follow up 2/23 he was in atrial fibrillation, TEE/DCCV arranged however INR subtherapeutic. He cancelled rescheduled procedure due to a death in the family.  Follow up 3/23. Remained in AF. Volume overloaded. Lasix  increased for 3 days. Underwent TEE guided DCCV on 03/19/22.  TEE showed EF 50-55%, mildly decreased RV function, normal bioprosthetic aortic valve, mild Nathaniel and mild mitral stenosis (mean gradient 3 mmHg).    Recently diagnosed with stage IA2 NSCLC RUL. Saw  [facility] and decided on empiric treatment with SBRT.  He has completed radiation.   Admitted 9/23 with NSTEMI and AF with RVR. Underwent R/LHC showing normal filling pressures, known occlusion of native coronaries and SVG-RCA from original CABG. New occlusion of SVG-PDA from CABG#2. Long 99% stenosis in the SVG-ramus. Ramus is relatively small vessel. Patent LIMA-LAD with collaterals to RCA territory. Case was discussed with Dr. Wendel, wouldn't benefit from intervention, medically managed. Patient declined re-challenge of amiodarone  and was able to have  successful TEE/DCCV to NSR. TEE showed EF 45% with normal RV, mild-moderate Nathaniel, normal bioprosthetic aortic valve, no LA appendage thrombus. Discharged home, weight 184 lbs.  Follow up 9/23, back in atrial fibrillation with HR 116. Toprol  increased to 25 bid and arranged for TEE/DCCV.  TEE/DCCV (10/23) showed EF 45%, diffuse HK, RV mildly decreased, moderate Nathaniel, Bioprosthetic AoV stable with mean gradient 6 mmHg, no LAA. Proceeded with successful DCCV to NSR.  However, later in 10/23 he was back in AF and has been in AF since that time.   Echo in 5/24 showed EF lower at 30-35%, moderate LV dilation, mild RV dysfunction, moderate-severe Nathaniel, bioprosthetic aortic valve appeared normal, IVC normal.   He was arranged for outpatient RHC done on 03/21/24 which demonstrated elevated right and left heart filling pressures, moderate mixed pulmonary arterial/pulmonary venous hypertension, prominent V-waves in PCWP tracing concerning for significant Nathaniel, as well as low CI and low PAPi (RA 15, PCWP 24 w/ V waves up to 35, PA sat 58%, FICK CI 2.07, PAPi 1.7, PVR 3.8 WU).  He was direct admitted for IV diuretics and further investigation of Nathaniel. Placed on Lasix  gtt. PICC was placed. Co-ox remained stable, did not require inotropic support. He diuresed well on lasix  gtt and transitioned back to torsemide  at 60 mg bid + HF GDMT. TEE was done to  further investigate the mitral valve. Study showed EF 35%, mild LV dilation, mildly dilated RV with mildly decreased systolic function, moderate left atrial enlargement, normally functioning AVR. There was moderate mitral annular calcification with severe mitral regurgitation with a calcified and restricted posterior leaflet. There were 2 jets of mitral regurgitation, PISA ERO 0.21 cm^2 when applied to each jet, so total PISA ERO 0.42 cm^2. MV mean gradient 2 mmHg.  There was flattening but not definite flow reversal in the pulmonary vein systolic doppler pattern. Structural heart team was consulted for consideration for mTEER. Given severe mitral annular calcification, his anatomy was not felt suitable for clip.    He returns today for followup of CHF.  No chest pain.  Using a cane now, has dyspnea with walking a long distance but does fine around the house.  Weight is stable.  He feels like his volume is well-controlled. No lightheadedness.  No orthopnea/PND.  No palpitations.   ECG: atrial fibrillation, IVCD 142 msec   Labs (1/19): K 4.9, creatinine 1.19 Labs (2/19): K 4.2, creatinine 0.89 Labs (3/19): LDL 86, HDL 45, TGs 245 Labs (5/19): LDL 110, HDL 39 Labs (6/19): K 4.1, creatinine 1.07 Labs (11/19): LDL 14, HDL 56 Labs (6/20): K 4.1, creatinine 1.0 Labs (7/20): K 4.3, creatinine 1.07 Labs (9/21): LDL 129, hgb 14.8, K 4.1, creatinine 1.02 Labs (12/21): LDL 20, HDL 46, K 4.4, creatinine 8.76 Labs (1/22): LDL 45, HDL 49, K 4.3, creatinine 1.1, BNP 184 Labs (3/22): K 4.4, creatinine 1.10, LDL 15, HDL 49 Labs (6/22): K 4.3, creatinine 1.11, LDL 121 Labs (7/22): K 4.3, creatinine 1.23, hgb 14.5 Labs (11/22): K 4.5, creatinine 1.14 Labs (2/23): K 4.3, creatinine 1.16 Labs (7/23): K 4.4, creatinine 1.24 Labs (9/23): K 4.1, creatinine 1.19 Labs (10/23): K 4.2, creatinine 1.31 Labs (1/24): K 4.8, SCr 1.13, Hgb A1c 7.7 Labs (4/24): K 5, creatinine 1.56 => 1.33, BNP 726 Labs (5/24): K 4.7, SCr  1.29 BNP 895 Labs (5/24): K 4.9, SCr 2.14, BNP 398 Labs (6/24): K 4.6, SCr 1.66  Labs (7/24): LDL 92 Labs (11/24): K 4, creatinine 1.34, pro-BNP 5110 Labs (4/25): K  3.9, creatinine 1.52 => 1.36, BNP 719, digoxin  level 0.7  PMH: 1. PAD: Peripheral arterial dopplers in 2012 showed > 75% bilateral SFA stenosis. Peripheral arterial dopplers in 10/14 showed > 50% focal bilateral SFA stenoses.  2. AAA: US  4/15 with 4.4 cm AAA but concern for penetrating ulcers. CTA abdomen showed 4.4 cm AAA with penetrating ulcers and possible pseudoaneurysms. Now s/p endovascular repair of AAA in 5/15 with no complications.  3. Carotid stenosis: Carotid dopplers (4/15) with 60-79% bilateral ICA stenosis. Carotid dopplers (11/15) with 60-79% bilateral ICA stenosis.  Carotid dopplers 6/16 with 60-79% bilateral ICA stenosis. - Carotid dopplers (1/18): 60-79% RICA stenosis.  - Carotid dopplers (1/19): 80-99% RICA stenosis => Right CEA in 2/19.  - Carotid dopplers (11/19): 40-59% bilateral stenosis.  - Carotid dopplers (1/21): 1-39% BICA stenosis.  4. CAD: CABG 1989 with LIMA-LAD, SVG-D, seq SVG-ramus and OM, sequential SVG-PDA/PLV. SVG-PDA/PLV found to be occluded on cath prior to AVR, so patient had SVG-PDA with AVR in 2007. LHC (7/15) with LIMA-LAD patent with 40-50% stenosis in LAD after touchdown, sequential SVG-ramus/OM/diagonal with only the ramus branch still intact (known from prior cath), SVG-PDA from original surgery TO, SVG-PDA from redo surgery with long 50-60% mid-graft stenosis.  No target for intervention.  - LHC (9/23): known occlusion of native coronaries and SVG-RCA from original CABG, new occlusion of SVG-PDA from CABG#2, long 99% stenosis in the SVG-ramus (was sequential SVG-ramus/OM/D but OM and D branches noted to be lost on prior cath), ramus is relatively small vessel, patent LIMA-LAD with collaterals to RCA territory. Case reviewed with Dr. Wendel, probably not much benefit to intervention on  SVG-ramus. Small territory covered and complex disease in SVG. Medical management advised. 5. Severe aortic stenosis: Bioprosthetic AVR in 2007.  6. Atrial fibrillation: Paroxysmal. Initially noted after cardiac surgery, then again after AAA repair.  DCCV to NSR 06/06/14.  DCCV to NSR again 08/21/14.  - DCCV to NSR in 1/19.  - DCCV to NSR in 6/20. .  - DCCV to NSR 12/21, 2/22, 3/22, 4/23, 9/23, 10/23 - Now permanent, off Tikosyn .  7. Type II diabetes  8. Gout  9. GERD  10. Hyperlipidemia  11. HTN 12. GERD  13. Chronic systolic CHF: Echo (6/15) with EF worsened to 25-30% with grade II diastolic dysfunction, stable bioprosthetic aortic valve, mildly dilated RV with normal systolic function.  Echo (1/16) with EF 55-60%, basal inferior hypokinesis, mild LVH, bioprosthetic aortic valve with mean gradient 13 mmHg, mild Nathaniel, severe LAE.  - Echo (1/19): EF 45-50%, mild Nathaniel, normal bioprosthetic aortic valve.  - TEE (6/20): EF 50% with mild diffuse hypokinesis, mildly decreased RV systolic function with mild RV enlargement, bioprosthetic aortic valve looked ok, mild Nathaniel. - Echo (1/22):  EF 45-50%, diffuse hypokinesis, mildly decreased RV systolic function, moderate biatrial enlargement, bioprosthetic valve with mean gradient 8 mmHg.   - TEE (2/22): EF 45-50%, RV normal, moderate biatrial enlargement, bioprosthetic AoV with mean gradient 6 mmHg, mild Nathaniel.  - TEE (4/23): EF 50-55%, mildly decreased RV function, normal bioprosthetic aortic valve, mild Nathaniel and mild mitral stenosis (mean gradient 3 mmHg) - TEE (9/23): EF 45%, RV normal, no LAA thrombus, no PFO/ASD, mild to moderate Nathaniel w/ moderate MAC, AoV without stenosis/regurgitation - TEE (10/23): EF 45%, RV mildly decreased, no LAA thrombus, no PFO/ASD, moderate Nathaniel, AoV without stenosis/regurgitation, mean gradient 6 mmHg. - Echo (5/24): EF 30-35%, moderate LV dilation, mild RV dysfunction, moderate-severe Nathaniel, bioprosthetic aortic valve appeared normal, IVC  normal.  -  RHC (4/25): mean RA 13, PA 51/26 mean 46, mean PCWP 24, CI 2.07, PVR 3.8 WU, PAPi 1.7 - TEE (4/25): EF 35%, mild LV dilation, mildly dilated RV with mildly decreased systolic function, moderate left atrial enlargement, normally functioning bioprosthetic AVR, severe Nathaniel with restricted mitral leaflets.   14. BPH  15. Nonsmall cell lung cancer: Suspected by imaging, treated in 2023 with radiation.  16. Fall with rhabdomyolysis (10/22)  17. Severe Mitral Regurgitation: TEE (4/25) moderate MAC with severe Nathaniel with a calcified and restricted posterior leaflet. There were 2 jets of mitral regurgitation, PISA ERO 0.21 cm^2 when applied to each jet, so total PISA ERO 0.42 cm^2. MV mean gradient 2 mmHg.  There was flattening but not definite flow reversal in the pulmonary vein systolic doppler pattern. - TEE reviewed by structural heart service, thought not to be good mTEER candidate due to posterior leaflet calcification.   SH: Widower, 2 children, raised his 3 grand-daughters, lives in [place], [place].  Occasional ETOH, no smoking.   FH: Father died from ruptured AAA.   ROS: All systems reviewed and negative except as per HPI.   Current Outpatient Medications  Medication Sig Dispense Refill   atorvastatin  (LIPITOR) 20 MG tablet Take 1 tablet (20 mg total) by mouth daily. Alternating with 30mg  30 tablet 5   atorvastatin  (LIPITOR) 20 MG tablet Take 0.5 tablets (10 mg total) by mouth daily. 90 tablet 1   Coenzyme Q10 (CO Q10) 200 MG CAPS Take 200 mg by mouth daily.     digoxin  (LANOXIN ) 0.125 MG tablet Take 1 tablet (0.125 mg total) by mouth daily. 90 tablet 3   dorzolamide -timolol  (COSOPT ) 22.3-6.8 MG/ML ophthalmic solution Place 1 drop into both eyes 2 (two) times daily.     ezetimibe  (ZETIA ) 10 MG tablet TAKE ONE TABLET BY MOUTH AT BEDTIME 90 tablet 3   JARDIANCE  10 MG TABS tablet Take 1 tablet (10 mg total) by mouth daily before breakfast. 30 tablet 11   latanoprost  (XALATAN ) 0.005  % ophthalmic solution Place 1 drop into both eyes at bedtime.     METAMUCIL FIBER PO Take 1 Dose by mouth as needed (constipation).     metoprolol  succinate (TOPROL -XL) 50 MG 24 hr tablet Take 1 tablet (50 mg total) by mouth in the morning and at bedtime. Take with or immediately following a meal. 60 tablet 3   Misc Natural Products (GLUCOSAMINE CHONDROITIN TRIPLE) TABS Take 1 tablet by mouth daily.     potassium chloride  SA (KLOR-CON  M) 20 MEQ tablet TAKE ONE TABLET BY MOUTH TWICE DAILY 90 tablet 3   REPATHA  SURECLICK 140 MG/ML SOAJ INJECT 1 PEN INTO SKIN EVERY 14 DAYS 2 mL 11   spironolactone  (ALDACTONE ) 25 MG tablet TAKE ONE TABLET BY MOUTH EVERY DAY 30 tablet 5   tamsulosin  (FLOMAX ) 0.4 MG CAPS capsule TAKE ONE CAPSULE BY MOUTH TWICE DAILY 180 capsule 3   torsemide  (DEMADEX ) 20 MG tablet Take 60 mg by mouth 2 (two) times daily. 2 tablets in the PM     warfarin (COUMADIN ) 3 MG tablet Take 9 mg (3 tablets) daily TAKE THREE TABLETS DAILY OR AS DIRECTED BY COUMADIN  CLINIC 90 tablet 0   No current facility-administered medications for this visit.   There were no vitals taken for this visit.   Wt Readings from Last 3 Encounters:  05/01/24 70.8 kg (156 lb)  04/05/24 71.8 kg (158 lb 6.4 oz)  04/04/24 70.9 kg (156 lb 3.2 oz)   PHYSICAL EXAM: General: NAD  Neck: No JVD, no thyromegaly or thyroid  nodule.  Lungs: Clear to auscultation bilaterally with normal respiratory effort. CV: Nondisplaced PMI.  Heart irregular S1/S2, no S3/S4, 2/6 HSM apex.  1+ ankle edema.  No carotid bruit.  Normal pedal pulses.  Abdomen: Soft, nontender, no hepatosplenomegaly, no distention.  Skin: Intact without lesions or rashes.  Neurologic: Alert and oriented x 3.  Psych: Normal affect. Extremities: No clubbing or cyanosis.  HEENT: Normal.   Assessment/Plan:  1.  Chronic Systolic Heart Failure: Ischemic cardiomyopathy.  Echo 9/23 with stable EF 45-50%, LV global hypokinesis, RV systolic function normal. Filling  pressures normal on RHC (9/23). TEE in 10/23 showed EF 45%, diffuse HK, RV mildly decreased, moderate Nathaniel, bioprosthetic AoV stable with mean gradient 6 mmHg, no LAA. Echo 5/24 EF 30-35%, RV mildly reduced, Nathaniel moderate-severe. Suspect further drop in EF may be tachycardia-mediated with permanent AF.  TEE in 4/25 showed EF 35%, RV mildly reduced, severe Nathaniel. RHC 4/25 demonstrated elevated right and left heart filling pressures, moderate mixed pulmonary arterial/pulmonary venous hypertension, prominent V-waves in PCWP tracing concerning for significant Nathaniel, as well as low CI and low PAPi.  NYHA class II-III, not significantly volume overloaded on exam and weight is stable.  - Continue torsemide  60 qam/40 qpm.  - I am going to start him back on Jardiance  10 mg daily.  BMET/BNP today, BMET in 10 days.  - Continue Toprol  XL 50 mg bid.  - Continue spironolactone  25 mg daily. - Continue Digoxin  0.125. Check digoxin  level today.  - If creatinine remains stable, may be able to start low dose ARB in future.  BP may not tolerate Entresto.  2. Mitral Regurgitation: prominent V waves on PCWP tracing, up to 35, on 4/25 RHC. Subsequent TEE 4/25 confirmed severe mitral regurgitation with a calcified and restricted posterior leaflet. Mod-severe MAC. There were 2 jets of mitral regurgitation, PISA ERO 0.21 cm^2 when applied to each jet, so total PISA ERO 0.42 cm^2. MV mean gradient 2 mmHg.  There was flattening but not definite flow reversal in the pulmonary vein systolic doppler pattern. He was evaluated by the Structural Heart Team. Given severe mitral annular calcification, his anatomy was not felt suitable for mTEER.    - Medical management.  3.  CAD: s/p CABG and Redo CABG. LHC 06/2014 showed significant coronary disease w/ no good interventional targets (LIMA-LAD patent with 40-50% stenosis in LAD after touchdown, sequential SVG-ramus/OM/diagonal with only the ramus branch still intact (known from prior cath), SVG-PDA from  original surgery TO, SVG-PDA from redo surgery with long 50-60% mid-graft stenosis). NSTEMI 9/23, repeat LHC showed occlusion of the 2nd SVG-PDA and 99% stenosis in the SVG-ramus.  Probably not much benefit to intervention on SVG-ramus.  Small territory covered and complex disease in the SVG.  No chest pain.  - Now off ASA with warfarin use. - Continue atorvastatin  20 mg daily + Zetia  + Repatha .  Check lipids today.  4. Atrial fibrillation: Now permanent. Seen by EP, not thought to be good ablation candidate. He is well rate controlled.  - Continue warfarin. INR followed by coumadin  clinic. - Continue Toprol  XL 50 mg bid  4. Bioprosthetic AVR:  Normal function on TEE 4/25  5. CKD stage 3: B/l SCr ~1.3 - BMET today.  - Restart Jardiance  as above.  6. PAD:  AAA s/p EVR, LE PAD + Carotid Artery Disease s/p Rt CEA. No current claudication symptoms.  - Followed by Dr. Serene. - Continue lipid lowering agents per  above.   Followup with APP in 6 wks.   I spent 32 minutes reviewing records, interviewing/examining patient, and managing orders.    Harlene HERO [place],  06/12/2024

## 2024-06-13 ENCOUNTER — Encounter (HOSPITAL_COMMUNITY)

## 2024-06-15 ENCOUNTER — Ambulatory Visit: Attending: Cardiology

## 2024-06-15 ENCOUNTER — Telehealth: Payer: Self-pay | Admitting: *Deleted

## 2024-06-15 NOTE — Telephone Encounter (Signed)
 Pt missed coumadin  appointment today. Attempted to call pt to reschedule but was not able to get in touch with him.

## 2024-06-19 DIAGNOSIS — Z961 Presence of intraocular lens: Secondary | ICD-10-CM | POA: Diagnosis not present

## 2024-06-19 DIAGNOSIS — H35373 Puckering of macula, bilateral: Secondary | ICD-10-CM | POA: Diagnosis not present

## 2024-06-19 DIAGNOSIS — H401122 Primary open-angle glaucoma, left eye, moderate stage: Secondary | ICD-10-CM | POA: Diagnosis not present

## 2024-06-19 LAB — HM DIABETES EYE EXAM

## 2024-07-06 ENCOUNTER — Ambulatory Visit: Admitting: Family Medicine

## 2024-07-06 ENCOUNTER — Encounter: Payer: Self-pay | Admitting: Family Medicine

## 2024-07-06 VITALS — BP 110/60 | HR 81 | Temp 97.3°F | Ht 70.0 in | Wt 162.2 lb

## 2024-07-06 DIAGNOSIS — I10 Essential (primary) hypertension: Secondary | ICD-10-CM | POA: Diagnosis not present

## 2024-07-06 DIAGNOSIS — E785 Hyperlipidemia, unspecified: Secondary | ICD-10-CM | POA: Diagnosis not present

## 2024-07-06 DIAGNOSIS — E118 Type 2 diabetes mellitus with unspecified complications: Secondary | ICD-10-CM

## 2024-07-06 DIAGNOSIS — I714 Abdominal aortic aneurysm, without rupture, unspecified: Secondary | ICD-10-CM

## 2024-07-06 DIAGNOSIS — Z7984 Long term (current) use of oral hypoglycemic drugs: Secondary | ICD-10-CM | POA: Diagnosis not present

## 2024-07-06 NOTE — Progress Notes (Signed)
 Phone [phone] In person visit   Subjective:   Nathaniel [name]. is a 89 y.o. year old very pleasant male patient who presents for/with See problem oriented charting Chief Complaint  Patient presents with   Medical Management of Chronic Issues   Diabetes    DEE requested    Past Medical History-  Patient Active Problem List   Diagnosis Date Noted   Controlled type 2 diabetes mellitus with complication, without long-term current use of insulin  (HCC) 02/19/2022    Priority: High   Afib (HCC) 06/12/2019    Priority: High   Chronic combined systolic and diastolic heart failure (HCC) 06/12/2014    Priority: High   CAD (coronary artery disease) of artery bypass graft 09/14/2012    Priority: High   Carotid artery disease without cerebral infarction (HCC) 09/14/2012    Priority: High   History of aortic valve replacement with bioprosthetic valve 09/14/2012    Priority: High   Aortic atherosclerosis (HCC) 02/19/2022    Priority: Medium    Chronic gout without tophus 02/19/2022    Priority: Medium    BPH (benign prostatic hyperplasia) 10/11/2021    Priority: Medium    Asymptomatic carotid artery stenosis without infarction, right 02/02/2018    Priority: Medium    Solitary pulmonary nodule 03/23/2016    Priority: Medium    Pulmonary nodule 12/25/2015    Priority: Medium    AAA (abdominal aortic aneurysm) without rupture (HCC) 04/13/2014    Priority: Medium    Occlusion and stenosis of carotid artery without mention of cerebral infarction 04/09/2014    Priority: Medium    Abdominal aortic aneurysm (HCC) 09/14/2012    Priority: Medium    Peripheral vascular disease with claudication (HCC) 09/14/2012    Priority: Medium    Essential hypertension 09/14/2012    Priority: Medium    Hyperlipidemia with target LDL less than 70 09/14/2012    Priority: Medium    Secondary hypercoagulable state (HCC) 02/19/2022    Priority: Low   Rhabdomyolysis 10/10/2021    Priority: Low    Aspiration of foreign body     Priority: Low   Foreign body aspiration 12/23/2015    Priority: Low   Encounter for therapeutic drug monitoring 03/28/2015    Priority: Low   Nonrheumatic mitral valve regurgitation 03/22/2024   Pressure injury of skin 03/22/2024   Acute on chronic systolic CHF (congestive heart failure) (HCC) 03/21/2024   NSTEMI (non-ST elevated myocardial infarction) (HCC) 08/20/2022   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) 04/01/2022    Medications- reviewed and updated Current Outpatient Medications  Medication Sig Dispense Refill   atorvastatin  (LIPITOR) 20 MG tablet Take 1 tablet by mouth Daily. 90 tablet 3   Coenzyme Q10 (CO Q10) 200 MG CAPS Take 200 mg by mouth daily.     digoxin  (LANOXIN ) 0.125 MG tablet Take 1 tablet (0.125 mg total) by mouth daily. 90 tablet 3   dorzolamide -timolol  (COSOPT ) 22.3-6.8 MG/ML ophthalmic solution Place 1 drop into both eyes 2 (two) times daily.     ezetimibe  (ZETIA ) 10 MG tablet TAKE ONE TABLET BY MOUTH AT BEDTIME 90 tablet 3   latanoprost  (XALATAN ) 0.005 % ophthalmic solution Place 1 drop into both eyes at bedtime.     METAMUCIL FIBER PO Take 1 Dose by mouth as needed (constipation).     metoprolol  succinate (TOPROL -XL) 50 MG 24 hr tablet Take 1 tablet (50 mg total) by mouth in the morning and at bedtime. Take with or  immediately following a meal. 60 tablet 3   Misc Natural Products (GLUCOSAMINE CHONDROITIN TRIPLE) TABS Take 1 tablet by mouth daily.     potassium chloride  SA (KLOR-CON  M) 20 MEQ tablet TAKE ONE TABLET BY MOUTH TWICE DAILY 90 tablet 3   REPATHA  SURECLICK 140 MG/ML SOAJ INJECT 1 PEN INTO SKIN EVERY 14 DAYS 2 mL 11   spironolactone  (ALDACTONE ) 25 MG tablet TAKE ONE TABLET BY MOUTH EVERY DAY 30 tablet 5   tamsulosin  (FLOMAX ) 0.4 MG CAPS capsule TAKE ONE CAPSULE BY MOUTH TWICE DAILY 180 capsule 3   torsemide  (DEMADEX ) 20 MG tablet Take 60 mg by mouth 2 (two) times daily. 2 tablets in the PM     warfarin  (COUMADIN ) 3 MG tablet Take 9 mg (3 tablets) daily TAKE THREE TABLETS DAILY OR AS DIRECTED BY COUMADIN  CLINIC 90 tablet 0   No current facility-administered medications for this visit.     Objective:  BP 110/60   Pulse 81   Temp (!) 97.3 F (36.3 C)   Ht 5' 10 (1.778 m)   Wt 162 lb 3.2 oz (73.6 kg)   SpO2 94%   BMI 23.27 kg/m  Gen: NAD, resting comfortably CV: irregularly irregular  Lungs: CTAB no crackles, wheeze, rhonchi Abdomen: soft/nontender/nondistended/normal bowel sounds. No rebound or guarding.  Ext: trace edema Skin: warm, dry Neuro: grossly normal, moves all extremities  Diabetic foot exam was performed with the following findings:   No deformities, ulcerations, or other skin breakdown Normal sensation of 10g monofilament Intact posterior tibialis and dorsalis pedis pulses Monofilament is felt but reports faint         Assessment and Plan   Health maintenance - prevnar 20 offered. Declines for now  # Severe mitral regurgitation but not a candidate for MitraClip if he does not desire surgery- monitoring  #CHF-systolic and diastolic mild-follows with Dr. Rolan #hypertension S: medication: spironolactone  25 mg daily, metoprolol  50 mg XR twice daily ,  and torsemide  60 mg in morning and 40 mg in the evening (after April 2025 hospital up to 60 twice daily )-consistent -Urinary tract infection (UTI) on jardiance - later restarted on 10 mg by Dr. Mclean in 2025- but didn't tolerate- fatigue -may late retr ramipril  -he is 4 lbs but had jacket and tennis shoes on- on home scales hasn't gained much weight- got as low as 154 at home and felt too thin. No swelling BP Readings from Last 3 Encounters:  07/06/24 110/60  05/01/24 110/68  04/05/24 100/60  A/P: CHF-appears euvolemic- continue current medications  Hypertension - well controlled continue current medications   #CAD- CABG and redo CABG. Cath 08/21/22- still 99% stenosis in the SVG- ramus  #PAD- >50%  bilateral focal SFA stenosis- not cilostazol candidate with CHF #Carotid stenosis- s/p CEA in 2019- follows with Dr. Serene #hyperlipidemia/aortic atherosclerosis S: Medication:Zetia  10 mg at bedtime,  atorvastatin  20 mg and 30 mg on alternating days (coQ10), also prescribed repatha - has a hard time mentally self injecting- and #s go up- has not started bcak  Not on aspirin  as a result of being on coumadin  - no chest pain or shortness of breath (above baseline- baseline shortness of breath) Lab Results  Component Value Date   CHOL 160 05/01/2024   HDL 40 (L) 05/01/2024   LDLCALC 92 05/01/2024   LDLDIRECT 98 05/08/2009   TRIG 141 05/01/2024   CHOLHDL 4.0 05/01/2024  A/P: CAD-largely asymptomatic - continue current medications  Peripheral arterial disease-  Cholesterol- above goal- encouraged him  to restart his Repatha  as suspect would be controlled  # Persistent atrial fibrillation-sees Dr. Inocencio S:medication:  metoprolol  50 mg XL, coumadin  - had been managed by cardiology clinic -history of cardioversion - failed conversion -Tikosyn  stopped October/November 2024 and opted for rate control -CNS effects on amiodarone  in past -difficulty with rate control in past so plan has been to stay out of a fib A/P:  appropriately anticoagulated and rate controlled- continue current medicine   # Diabetes- prior followed by Dr. Mirna A1C- 05/20/2021- 7.0 S: Medication: NONE -UTI on jardiance  in past but Dr. Mclean restarted- see above as he had to stop again -has cut out alcohol and some non water  beverages- may help  Lab Results  Component Value Date   HGBA1C 7.0 (H) 03/21/2024   HGBA1C 7.4 (H) 07/06/2023   HGBA1C 7.7 (H) 01/08/2023  A/P: hopefully stable- update a1c today. Continue without meds for now  - still enjoys his ice cream even with diabetes   # BPH S:medication: tamsulosin  0.4mg  BID  -prior cardura   A/P: reasonably stable- continue current medications    #  AAA S:endovascular repair 2015- follows with Dr. Serene now  A/P: refer back to vascular- last visit 2022 it appears   #Gout S: no  flares in years on allopurinol  300mg - he stopped in 2024 on his own A/P:no recent flares   # Lung cancer- with lung nodule 01/05/18 first detected -showed worsening- refer to [facility]isc[facility]iplinary clinic - likely cancer-was treated with SBRT no surgery -last scan 11/24/22- with planned 3 month repeat as slight interval increase in a few lymph nodes - last x-ray 03/22/24 reassuring- offered repeat chest CT 07/06/24 and he declines due to his age and not sure he would want to intervene  Recommended follow up: Return in about 14 weeks (around 10/12/2024) for followup or sooner if needed.Schedule b4 you leave. Future Appointments  Date Time Provider Department Center  07/11/2024 10:00 AM [facility] ANNUAL WELLNESS VISIT 1 [facility]    Lab/Order associations:   ICD-10-CM   1. Controlled type 2 diabetes mellitus with complication, without long-term current use of insulin  (HCC)  E11.8 Urine Microalbumin w/creat. ratio    Hemoglobin A1c    Comprehensive metabolic panel with GFR    CBC with Differential/Platelet    2. Essential hypertension  I10     3. Hyperlipidemia with target LDL less than 70  E78.5     4. Abdominal aortic aneurysm (AAA) without rupture, unspecified part (HCC)  I71.40 Ambulatory referral to Vascular Surgery      No orders of the defined types were placed in this encounter.   Return precautions advised.  Garnette Lukes, MD

## 2024-07-06 NOTE — Patient Instructions (Addendum)
 If you have had your eye exam within the last year, please sign release of information at check out desk. If you have not had an eye exam within a year, please get one at this time as this is important for your diabetes care  We have placed a referral for you today to vascular surgery- please call their # if you do not hear within a week (may be listed below or you may see mychart message within a few days with #).   Please stop by lab before you go- urine if you can If you have mychart- we will send your results within 3 business days of us  receiving them.  If you do not have mychart- we will call you about results within 5 business days of us  receiving them.  *please also note that you will see labs on mychart as soon as they post. I will later go in and write notes on them- will say notes from Dr. Katrinka   Recommended follow up: Return in about 14 weeks (around 10/12/2024) for followup or sooner if needed.Schedule b4 you leave.

## 2024-07-07 ENCOUNTER — Ambulatory Visit: Payer: Self-pay | Admitting: Family Medicine

## 2024-07-07 LAB — CBC WITH DIFFERENTIAL/PLATELET
Basophils Absolute: 0.1 K/uL (ref 0.0–0.1)
Basophils Relative: 1.5 % (ref 0.0–3.0)
Eosinophils Absolute: 0.3 K/uL (ref 0.0–0.7)
Eosinophils Relative: 4.7 % (ref 0.0–5.0)
HCT: 39.6 % (ref 39.0–52.0)
Hemoglobin: 13.1 g/dL (ref 13.0–17.0)
Lymphocytes Relative: 18.2 % (ref 12.0–46.0)
Lymphs Abs: 1.1 K/uL (ref 0.7–4.0)
MCHC: 33 g/dL (ref 30.0–36.0)
MCV: 90.5 fl (ref 78.0–100.0)
Monocytes Absolute: 0.7 K/uL (ref 0.1–1.0)
Monocytes Relative: 12.1 % — ABNORMAL HIGH (ref 3.0–12.0)
Neutro Abs: 3.8 K/uL (ref 1.4–7.7)
Neutrophils Relative %: 63.5 % (ref 43.0–77.0)
Platelets: 163 K/uL (ref 150.0–400.0)
RBC: 4.38 Mil/uL (ref 4.22–5.81)
RDW: 14.9 % (ref 11.5–15.5)
WBC: 6 K/uL (ref 4.0–10.5)

## 2024-07-07 LAB — HEMOGLOBIN A1C: Hgb A1c MFr Bld: 6.8 % — ABNORMAL HIGH (ref 4.6–6.5)

## 2024-07-07 LAB — COMPREHENSIVE METABOLIC PANEL WITH GFR
ALT: 11 U/L (ref 0–53)
AST: 17 U/L (ref 0–37)
Albumin: 3.8 g/dL (ref 3.5–5.2)
Alkaline Phosphatase: 67 U/L (ref 39–117)
BUN: 27 mg/dL — ABNORMAL HIGH (ref 6–23)
CO2: 27 meq/L (ref 19–32)
Calcium: 9 mg/dL (ref 8.4–10.5)
Chloride: 99 meq/L (ref 96–112)
Creatinine, Ser: 1.38 mg/dL (ref 0.40–1.50)
GFR: 45.1 mL/min — ABNORMAL LOW (ref >60.00)
Glucose, Bld: 215 mg/dL — ABNORMAL HIGH (ref 70–99)
Potassium: 4.1 meq/L (ref 3.5–5.1)
Sodium: 136 meq/L (ref 135–145)
Total Bilirubin: 1.4 mg/dL — ABNORMAL HIGH (ref 0.2–1.2)
Total Protein: 7 g/dL (ref 6.0–8.3)

## 2024-07-11 ENCOUNTER — Ambulatory Visit (INDEPENDENT_AMBULATORY_CARE_PROVIDER_SITE_OTHER): Payer: Medicare Other

## 2024-07-11 VITALS — Ht 70.0 in | Wt 162.0 lb

## 2024-07-11 DIAGNOSIS — Z0000 Encounter for general adult medical examination without abnormal findings: Secondary | ICD-10-CM

## 2024-07-11 NOTE — Progress Notes (Signed)
 Subjective:   Nathaniel [name]. is a 89 y.o. who presents for a Medicare Wellness preventive visit.  As a reminder, Annual Wellness Visits don't include a physical exam, and some assessments may be limited, especially if this visit is performed virtually. We may recommend an in-person follow-up visit with your provider if needed.  Visit Complete: Virtual I connected with  Nathaniel [name]. on 07/11/24 by a audio enabled telemedicine application and verified that I am speaking with the correct person using two identifiers.  Patient Location: Home  Provider Location: Office/Clinic  I discussed the limitations of evaluation and management by telemedicine. The patient expressed understanding and agreed to proceed.  Vital Signs: Because this visit was a virtual/telehealth visit, some criteria may be missing or patient reported. Any vitals not documented were not able to be obtained and vitals that have been documented are patient reported.  VideoDeclined- This patient declined Librarian, academic. Therefore the visit was completed with audio only.  Persons Participating in Visit: Patient.  AWV Questionnaire: No: Patient Medicare AWV questionnaire was not completed prior to this visit.  Cardiac Risk Factors include: advanced age (>61men, >9 women)     Objective:    Today's Vitals   07/11/24 1001  Weight: 162 lb (73.5 kg)  Height: 5' 10 (1.778 m)   Body mass index is 23.24 kg/m.     07/11/2024   10:24 AM 03/21/2024    3:40 PM 03/21/2024    6:05 AM 07/06/2023   10:42 AM 11/24/2022   10:49 AM 11/11/2022   11:34 AM 09/23/2022    9:32 AM  Advanced Directives  Does Patient Have a Medical Advance Directive? Yes Yes Yes Yes Yes Yes Yes  Type of Estate agent of [place];Living will Healthcare Power of [employer] of [place];Living will Healthcare Power of [place];Living will  Living will;Healthcare Power of [employer] Power of [place];Living will  Does patient want to make changes to medical advance directive?  No - Patient declined       Copy of Healthcare Power of Attorney in Chart? No - copy requested   No - copy requested   No - copy requested    Current Medications (verified) Outpatient Encounter Medications as of 07/11/2024  Medication Sig   atorvastatin  (LIPITOR) 10 MG tablet Take 10 mg by mouth daily. (Patient taking differently: Take 10 mg by mouth every other day.)   atorvastatin  (LIPITOR) 20 MG tablet Take 1 tablet by mouth Daily.   Coenzyme Q10 (CO Q10) 200 MG CAPS Take 200 mg by mouth daily.   digoxin  (LANOXIN ) 0.125 MG tablet Take 1 tablet (0.125 mg total) by mouth daily.   dorzolamide -timolol  (COSOPT ) 22.3-6.8 MG/ML ophthalmic solution Place 1 drop into both eyes 2 (two) times daily.   ezetimibe  (ZETIA ) 10 MG tablet TAKE ONE TABLET BY MOUTH AT BEDTIME   latanoprost  (XALATAN ) 0.005 % ophthalmic solution Place 1 drop into both eyes at bedtime.   METAMUCIL FIBER PO Take 1 Dose by mouth as needed (constipation).   metoprolol  succinate (TOPROL -XL) 50 MG 24 hr tablet Take 1 tablet (50 mg total) by mouth in the morning and at bedtime. Take with or immediately following a meal.   Misc Natural Products (GLUCOSAMINE CHONDROITIN TRIPLE) TABS Take 1 tablet by mouth daily.   potassium chloride  SA (KLOR-CON  M) 20 MEQ tablet TAKE ONE TABLET BY MOUTH TWICE DAILY   REPATHA  SURECLICK 140 MG/ML SOAJ INJECT 1 PEN INTO SKIN EVERY 14  DAYS   spironolactone  (ALDACTONE ) 25 MG tablet TAKE ONE TABLET BY MOUTH EVERY DAY   tamsulosin  (FLOMAX ) 0.4 MG CAPS capsule TAKE ONE CAPSULE BY MOUTH TWICE DAILY   torsemide  (DEMADEX ) 20 MG tablet Take 60 mg by mouth 2 (two) times daily. 2 tablets in the PM   warfarin (COUMADIN ) 3 MG tablet Take 9 mg (3 tablets) daily TAKE THREE TABLETS DAILY OR AS DIRECTED BY COUMADIN  CLINIC   No facility-administered encounter medications on file as of 07/11/2024.    Allergies  (verified) Barbiturates, Grass extracts [gramineae pollens], Milk-related compounds, and Amiodarone    History: Past Medical History:  Diagnosis Date   AAA (abdominal aortic aneurysm) (HCC)    Atrial fibrillation (HCC)    Atrial fibrillation (HCC)    CAD (coronary artery disease)    Cataract    CHF (congestive heart failure) (HCC)    Chronic kidney disease    Colon polyps    adenomatous   Diabetes mellitus    Diverticulosis    Dysrhythmia    A-Fib   GERD (gastroesophageal reflux disease)    Gout    Hernia, abdominal    History of aortic valve replacement with bioprosthetic valve    2007   Hx: UTI (urinary tract infection)    Hyperlipemia    Hypertension    Internal hemorrhoids    Peripheral vascular disease (HCC)    Shortness of breath    with exertion   Past Surgical History:  Procedure Laterality Date   ABDOMINAL AORTIC ENDOVASCULAR STENT GRAFT N/A 04/13/2014   Procedure: ABDOMINAL AORTIC ENDOVASCULAR STENT GRAFT;  Surgeon: Gaile LELON New, MD;  Location: [facility] OR;  Service: Vascular;  Laterality: N/A;   ANGIOPLASTY     Unsure if he had stents put in    APPENDECTOMY     CARDIAC VALVE SURGERY     CARDIOVERSION N/A 06/06/2014   Procedure: CARDIOVERSION;  Surgeon: Ezra GORMAN Shuck, MD;  Location: [facility] ENDOSCOPY;  Service: Cardiovascular;  Laterality: N/A;   CARDIOVERSION N/A 08/21/2014   Procedure: CARDIOVERSION;  Surgeon: Ezra GORMAN Shuck, MD;  Location: [facility] ENDOSCOPY;  Service: Cardiovascular;  Laterality: N/A;   CARDIOVERSION N/A 12/22/2017   Procedure: CARDIOVERSION;  Surgeon: Shuck Ezra GORMAN, MD;  Location: [facility] ENDOSCOPY;  Service: Cardiovascular;  Laterality: N/A;   CARDIOVERSION N/A 05/25/2019   Procedure: CARDIOVERSION;  Surgeon: Shuck Ezra GORMAN, MD;  Location: [facility] ENDOSCOPY;  Service: Cardiovascular;  Laterality: N/A;   CARDIOVERSION N/A 06/14/2019   Procedure: CARDIOVERSION;  Surgeon: Jeffrie Oneil BROCKS, MD;  Location: [facility] ENDOSCOPY;  Service: Cardiovascular;  Laterality: N/A;    CARDIOVERSION N/A 12/12/2020   Procedure: CARDIOVERSION;  Surgeon: Shuck Ezra GORMAN, MD;  Location: [facility] ENDOSCOPY;  Service: Cardiovascular;  Laterality: N/A;   CARDIOVERSION N/A 01/29/2021   Procedure: CARDIOVERSION;  Surgeon: Shuck Ezra GORMAN, MD;  Location: [facility] ENDOSCOPY;  Service: Cardiovascular;  Laterality: N/A;   CARDIOVERSION N/A 03/19/2022   Procedure: CARDIOVERSION;  Surgeon: Shuck Ezra GORMAN, MD;  Location: [facility] ENDOSCOPY;  Service: Cardiovascular;  Laterality: N/A;   CARDIOVERSION N/A 08/24/2022   Procedure: CARDIOVERSION;  Surgeon: Shuck Ezra GORMAN, MD;  Location: [facility] ENDOSCOPY;  Service: Cardiovascular;  Laterality: N/A;   CARDIOVERSION N/A 09/23/2022   Procedure: CARDIOVERSION;  Surgeon: Shuck Ezra GORMAN, MD;  Location: [facility] ENDOSCOPY;  Service: Cardiovascular;  Laterality: N/A;   CORONARY ARTERY BYPASS GRAFT     ENDARTERECTOMY Right 02/02/2018   Procedure: ENDARTERECTOMY CAROTID RIGHT;  Surgeon: New Gaile LELON, MD;  Location: [facility] OR;  Service: Vascular;  Laterality:  Right;   EYE SURGERY Bilateral    cataract surgery   HERNIA REPAIR     2 operations   LEFT HEART CATHETERIZATION WITH CORONARY/GRAFT ANGIOGRAM N/A 07/10/2014   Procedure: LEFT HEART CATHETERIZATION WITH EL BILE;  Surgeon: Ezra GORMAN Shuck, MD;  Location: [facility] CATH LAB;  Service: Cardiovascular;  Laterality: N/A;   RIGHT HEART CATH N/A 03/21/2024   Procedure: RIGHT HEART CATH;  Surgeon: Shuck Ezra GORMAN, MD;  Location: [facility] INVASIVE CV LAB;  Service: Cardiovascular;  Laterality: N/A;   RIGHT HEART CATH AND CORONARY/GRAFT ANGIOGRAPHY N/A 08/21/2022   Procedure: RIGHT HEART CATH AND CORONARY/GRAFT ANGIOGRAPHY;  Surgeon: Shuck Ezra GORMAN, MD;  Location: [facility] INVASIVE CV LAB;  Service: Cardiovascular;  Laterality: N/A;   TEE WITHOUT CARDIOVERSION N/A 05/25/2019   Procedure: TRANSESOPHAGEAL ECHOCARDIOGRAM (TEE);  Surgeon: Shuck Ezra GORMAN, MD;  Location: [facility] ENDOSCOPY;  Service: Cardiovascular;  Laterality: N/A;   TEE WITHOUT  CARDIOVERSION N/A 01/29/2021   Procedure: TRANSESOPHAGEAL ECHOCARDIOGRAM (TEE);  Surgeon: Shuck Ezra GORMAN, MD;  Location: [facility] ENDOSCOPY;  Service: Cardiovascular;  Laterality: N/A;   TEE WITHOUT CARDIOVERSION N/A 03/19/2022   Procedure: TRANSESOPHAGEAL ECHOCARDIOGRAM (TEE);  Surgeon: Shuck Ezra GORMAN, MD;  Location: [facility] ENDOSCOPY;  Service: Cardiovascular;  Laterality: N/A;   TEE WITHOUT CARDIOVERSION N/A 08/24/2022   Procedure: TRANSESOPHAGEAL ECHOCARDIOGRAM (TEE);  Surgeon: Shuck Ezra GORMAN, MD;  Location: [facility] ENDOSCOPY;  Service: Cardiovascular;  Laterality: N/A;   TEE WITHOUT CARDIOVERSION N/A 09/23/2022   Procedure: TRANSESOPHAGEAL ECHOCARDIOGRAM (TEE);  Surgeon: Shuck Ezra GORMAN, MD;  Location: [facility] ENDOSCOPY;  Service: Cardiovascular;  Laterality: N/A;   TONSILLECTOMY     TRANSESOPHAGEAL ECHOCARDIOGRAM (CATH LAB) N/A 03/22/2024   Procedure: TRANSESOPHAGEAL ECHOCARDIOGRAM;  Surgeon: Shuck Ezra GORMAN, MD;  Location: [facility] INVASIVE CV LAB;  Service: Cardiovascular;  Laterality: N/A;   VIDEO BRONCHOSCOPY Bilateral 12/25/2015   Procedure: VIDEO BRONCHOSCOPY WITHOUT FLUORO;  Surgeon: Tonnie FORBES Flicker, MD;  Location: [facility] ENDOSCOPY;  Service: Cardiopulmonary;  Laterality: Bilateral;   Family History  Problem Relation Age of Onset   Heart disease Mother    Heart disease Father        before age 42   AAA (abdominal aortic aneurysm) Father    Melanoma Sister        with mets. died 58   Colon cancer Neg Hx    Social History   Socioeconomic History   Marital status: Widowed    Spouse name: Not on file   Number of children: Not on file   Years of education: Not on file   Highest education level: Not on file  Occupational History   Occupation: Prime Investor   Tobacco Use   Smoking status: Former    Current packs/day: 0.00    Average packs/day: 0.3 packs/day for 20.0 years (5.0 ttl pk-yrs)    Types: Cigarettes    Start date: 03/23/1972    Quit date: 03/23/1992    Years since quitting: 32.3     Passive exposure: Past   Smokeless tobacco: Never   Tobacco comments:    Former smoker 09/16/22  Vaping Use   Vaping status: Never Used  Substance and Sexual Activity   Alcohol use: Yes    Alcohol/week: 3.0 standard drinks of alcohol    Types: 3 Glasses of wine per week   Drug use: No   Sexual activity: Not Currently  Other Topics Concern   Not on file  Social History Narrative   Widowed (lives alone at [place])- wife died at 11 (cancer at 54).  Daughter 33, son 50 in 2023. 5 grandkids. 5 greatgrandkids.    -when patient were 45- started caring for 3 of his grandkids when daughter was not in a good place in the airforce.       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      Hobbies: golf, enjoys football and basketball at [employer] of [employer]: Low Risk  (07/11/2024)   Overall Financial Resource Strain (CARDIA)    Difficulty of Paying Living Expenses: Not hard at all  Food Insecurity: No Food Insecurity (07/11/2024)   Hunger Vital Sign    Worried About Running Out of Food in the Last Year: Never true    Ran Out of Food in the Last Year: Never true  Transportation Needs: No Transportation Needs (07/11/2024)   PRAPARE - Administrator, Civil Service (Medical): No    Lack of Transportation (Non-Medical): No  Physical Activity: Sufficiently Active (07/11/2024)   Exercise Vital Sign    Days of Exercise per Week: 5 days    Minutes of Exercise per Session: 30 min  Stress: No Stress Concern Present (07/11/2024)   [employer] of Occupational Health - Occupational Stress Questionnaire    Feeling of Stress: Not at all  Social Connections: Moderately Isolated (07/11/2024)   Social Connection and Isolation Panel    Frequency of Communication with Friends and Family: More than three times a week    Frequency of Social Gatherings with Friends and Family: More than three times a week    Attends Religious  Services: 1 to 4 times per year    Active Member of [employer] or Organizations: No    Attends Banker Meetings: Never    Marital Status: Widowed    Tobacco Counseling Counseling given: Not Answered Tobacco comments: Former smoker 09/16/22    Clinical Intake:  Pre-visit preparation completed: Yes  Pain : No/denies pain     BMI - recorded: 23.24 Nutritional Status: BMI of 19-24  Normal Nutritional Risks: None Diabetes: Yes CBG done?: No Did pt. bring in CBG monitor from home?: No  Lab Results  Component Value Date   HGBA1C 6.8 (H) 07/06/2024   HGBA1C 7.0 (H) 03/21/2024   HGBA1C 7.4 (H) 07/06/2023     How often do you need to have someone help you when you read instructions, pamphlets, or other written materials from your doctor or pharmacy?: 1 - Never  Interpreter Needed?: No  Information entered by :: Ellouise Haws, LPN   Activities of Daily Living     07/11/2024   10:03 AM 03/21/2024    3:40 PM  In your present state of health, do you have any difficulty performing the following activities:  Hearing? 0 1  Vision? 0 0  Difficulty concentrating or making decisions? 0 0  Walking or climbing stairs? 1   Dressing or bathing? 0   Doing errands, shopping? 0 1  Preparing Food and eating ? N   Using the Toilet? N   In the past six months, have you accidently leaked urine? N   Do you have problems with loss of bowel control? N   Managing your Medications? N   Managing your Finances? N   Housekeeping or managing your Housekeeping? N     Patient Care Team: Katrinka Garnette KIDD, MD as PCP - General (Family Medicine) Inocencio Soyla Lunger, MD as PCP - Electrophysiology (Cardiology) Edith, Debby BROCKS, MD (Cardiology)  I have updated your Care Teams any recent Medical Services you may have received from other providers in the past year.     Assessment:   This is a routine wellness examination for [place].  Hearing/Vision screen Hearing Screening - Comments:: Pt  denies any hearing issues  Vision Screening - Comments:: Wears rx glasses - up to date with routine eye exams with Dr Octavia    Goals Addressed             This Visit's Progress    Patient Stated       A little more exercise        Depression Screen     07/11/2024   10:14 AM 07/06/2024    3:17 PM 07/06/2023   10:40 AM 05/05/2022   10:16 AM  PHQ 2/9 Scores  PHQ - 2 Score 0 0 0 0  PHQ- 9 Score 0 0      Fall Risk     07/11/2024   10:17 AM 07/06/2023   10:43 AM 02/19/2022    3:52 PM  Fall Risk   Falls in the past year? 1 1 0  Number falls in past yr: 1 1 0  Injury with Fall? 0 1 0  Comment  nose   Risk for fall due to : History of fall(s);Impaired balance/gait;Impaired mobility Impaired vision;Impaired balance/gait;History of fall(s) Impaired balance/gait  Follow up Falls prevention discussed Falls prevention discussed Falls evaluation completed      Data saved with a previous flowsheet row definition    MEDICARE RISK AT HOME:  Medicare Risk at Home Any stairs in or around the home?: No If so, are there any without handrails?: No Home free of loose throw rugs in walkways, pet beds, electrical cords, etc?: Yes Adequate lighting in your home to reduce risk of falls?: Yes Life alert?: Yes Use of a cane, walker or w/c?: Yes Grab bars in the bathroom?: Yes Shower chair or bench in shower?: Yes Elevated toilet seat or a handicapped toilet?: Yes  TIMED UP AND GO:  Was the test performed?  No  Cognitive Function: 6CIT completed        07/11/2024   10:19 AM 07/06/2023   10:45 AM  6CIT Screen  What Year? 0 points 0 points  What month? 0 points 0 points  What time? 0 points 0 points  Count back from 20 0 points 0 points  Months in reverse 0 points 0 points  Repeat phrase 0 points 0 points  Total Score 0 points 0 points    Immunizations Immunization History  Administered Date(s) Administered   Influenza Split 10/24/2015   Influenza, High Dose Seasonal PF  10/14/2018   PFIZER(Purple Top)SARS-COV-2 Vaccination 08/20/2020, 09/10/2020    Screening Tests Health Maintenance  Topic Date Due   OPHTHALMOLOGY EXAM  04/20/2024   COVID-19 Vaccine (3 - Pfizer risk series) 07/22/2024 (Originally 10/08/2020)   Zoster Vaccines- Shingrix (1 of 2) 10/06/2024 (Originally 04/03/1951)   Pneumococcal Vaccine: 50+ Years (1 of 2 - PCV) 07/06/2025 (Originally 04/03/1951)   INFLUENZA VACCINE  07/14/2024   HEMOGLOBIN A1C  01/06/2025   FOOT EXAM  07/06/2025   Medicare Annual Wellness (AWV)  07/11/2025   Hepatitis B Vaccines  Aged Out   HPV VACCINES  Aged Out   Meningococcal B Vaccine  Aged Out   DTaP/Tdap/Td  Discontinued   Colonoscopy  Discontinued    Health Maintenance  Health Maintenance Due  Topic Date Due   OPHTHALMOLOGY EXAM  04/20/2024  Health Maintenance Items Addressed: See Nurse Notes at the end of this note  Additional Screening:  Vision Screening: Recommended annual ophthalmology exams for early detection of glaucoma and other disorders of the eye. Would you like a referral to an eye doctor? No    Dental Screening: Recommended annual dental exams for proper oral hygiene  Community Resource Referral / Chronic Care Management: CRR required this visit?  No   CCM required this visit?  No   Plan:    I have personally reviewed and noted the following in the patient's chart:   Medical and social history Use of alcohol, tobacco or illicit drugs  Current medications and supplements including opioid prescriptions. Patient is not currently taking opioid prescriptions. Functional ability and status Nutritional status Physical activity Advanced directives List of other physicians Hospitalizations, surgeries, and ER visits in previous 12 months Vitals Screenings to include cognitive, depression, and falls Referrals and appointments  In addition, I have reviewed and discussed with patient certain preventive protocols, quality metrics,  and best practice recommendations. A written personalized care plan for preventive services as well as general preventive health recommendations were provided to patient.   Ellouise VEAR Haws, LPN   2/70/7974   After Visit Summary: (Declined) Due to this being a telephonic visit, with patients personalized plan was offered to patient but patient Declined AVS at this time   Notes: Nothing significant to report at this time.

## 2024-07-11 NOTE — Patient Instructions (Signed)
 Mr. Mucha , Thank you for taking time out of your busy schedule to complete your Annual Wellness Visit with me. I enjoyed our conversation and look forward to speaking with you again next year. I, as well as your care team,  appreciate your ongoing commitment to your health goals. Please review the following plan we discussed and let me know if I can assist you in the future. Your Game plan/ To Do List    Referrals: If you haven't heard from the office you've been referred to, please reach out to them at the phone provided.   Follow up Visits: Next Medicare AWV with our clinical staff: 07/16/25   Have you seen your provider in the last 6 months (3 months if uncontrolled diabetes)? Yes Next Office Visit with your provider: 10/12/24  Clinician Recommendations:  Each day, aim for 6 glasses of water , plenty of protein in your diet and try to get up and walk/ stretch every hour for 5-10 minutes at a time.        This is a list of the screening recommended for you and due dates:  Health Maintenance  Topic Date Due   Eye exam for diabetics  04/20/2024   Medicare Annual Wellness Visit  07/05/2024   COVID-19 Vaccine (3 - Pfizer risk series) 07/22/2024*   Zoster (Shingles) Vaccine (1 of 2) 10/06/2024*   Pneumococcal Vaccine for age over 47 (1 of 2 - PCV) 07/06/2025*   Flu Shot  07/14/2024   Hemoglobin A1C  01/06/2025   Complete foot exam   07/06/2025   Hepatitis B Vaccine  Aged Out   HPV Vaccine  Aged Out   Meningitis B Vaccine  Aged Out   DTaP/Tdap/Td vaccine  Discontinued   Colon Cancer Screening  Discontinued  *Topic was postponed. The date shown is not the original due date.    Advanced directives: (Copy Requested) Please bring a copy of your health care power of attorney and living will to the office to be added to your chart at your convenience. You can mail to [facility] [address] [place], [place] [address] or email to [email or web address] Advance Care  Planning is important because it:  [x]  Makes sure you receive the medical care that is consistent with your values, goals, and preferences  [x]  It provides guidance to your family and loved ones and reduces their decisional burden about whether or not they are making the right decisions based on your wishes.  Follow the link provided in your after visit summary or read over the paperwork we have mailed to you to help you started getting your Advance Directives in place. If you need assistance in completing these, please reach out to us  so that we can help you!  See attachments for Preventive Care and Fall Prevention Tips.

## 2024-07-17 ENCOUNTER — Other Ambulatory Visit (HOSPITAL_COMMUNITY): Payer: Self-pay | Admitting: Cardiology

## 2024-07-17 DIAGNOSIS — I4891 Unspecified atrial fibrillation: Secondary | ICD-10-CM

## 2024-07-24 ENCOUNTER — Telehealth (HOSPITAL_COMMUNITY): Payer: Self-pay

## 2024-07-24 NOTE — Telephone Encounter (Signed)
 Called to confirm/remind patient of their appointment at [facility] on 07/25/24 11:00.   Appointment:   [x] Confirmed  [] Left mess   [] No answer/No voice mail  [] VM Full/unable to leave message  [] Phone not in service  Patient reminded to bring all medications and/or complete list.  Confirmed patient has transportation. Gave directions, instructed to utilize valet parking.

## 2024-07-25 ENCOUNTER — Ambulatory Visit (HOSPITAL_COMMUNITY): Payer: Self-pay | Admitting: Cardiology

## 2024-07-25 ENCOUNTER — Ambulatory Visit (HOSPITAL_COMMUNITY)
Admission: RE | Admit: 2024-07-25 | Discharge: 2024-07-25 | Disposition: A | Discharge status: Home / Self Care | Source: Ambulatory Visit | Attending: Cardiology | Admitting: Cardiology

## 2024-07-25 VITALS — BP 116/62 | HR 61 | Ht 70.0 in | Wt 158.2 lb

## 2024-07-25 DIAGNOSIS — I739 Peripheral vascular disease, unspecified: Secondary | ICD-10-CM | POA: Diagnosis not present

## 2024-07-25 DIAGNOSIS — I2510 Atherosclerotic heart disease of native coronary artery without angina pectoris: Secondary | ICD-10-CM | POA: Insufficient documentation

## 2024-07-25 DIAGNOSIS — I340 Nonrheumatic mitral (valve) insufficiency: Secondary | ICD-10-CM | POA: Diagnosis not present

## 2024-07-25 DIAGNOSIS — I5022 Chronic systolic (congestive) heart failure: Secondary | ICD-10-CM | POA: Insufficient documentation

## 2024-07-25 DIAGNOSIS — I4891 Unspecified atrial fibrillation: Secondary | ICD-10-CM | POA: Diagnosis present

## 2024-07-25 DIAGNOSIS — I252 Old myocardial infarction: Secondary | ICD-10-CM | POA: Insufficient documentation

## 2024-07-25 DIAGNOSIS — Z952 Presence of prosthetic heart valve: Secondary | ICD-10-CM | POA: Insufficient documentation

## 2024-07-25 DIAGNOSIS — Z7901 Long term (current) use of anticoagulants: Secondary | ICD-10-CM | POA: Diagnosis not present

## 2024-07-25 DIAGNOSIS — Z9889 Other specified postprocedural states: Secondary | ICD-10-CM | POA: Diagnosis not present

## 2024-07-25 DIAGNOSIS — I6523 Occlusion and stenosis of bilateral carotid arteries: Secondary | ICD-10-CM | POA: Diagnosis not present

## 2024-07-25 DIAGNOSIS — I255 Ischemic cardiomyopathy: Secondary | ICD-10-CM | POA: Diagnosis not present

## 2024-07-25 DIAGNOSIS — Z951 Presence of aortocoronary bypass graft: Secondary | ICD-10-CM | POA: Diagnosis not present

## 2024-07-25 DIAGNOSIS — I5042 Chronic combined systolic (congestive) and diastolic (congestive) heart failure: Secondary | ICD-10-CM | POA: Diagnosis not present

## 2024-07-25 DIAGNOSIS — Z79899 Other long term (current) drug therapy: Secondary | ICD-10-CM | POA: Insufficient documentation

## 2024-07-25 DIAGNOSIS — I130 Hypertensive heart and chronic kidney disease with heart failure and stage 1 through stage 4 chronic kidney disease, or unspecified chronic kidney disease: Secondary | ICD-10-CM | POA: Diagnosis not present

## 2024-07-25 DIAGNOSIS — N183 Chronic kidney disease, stage 3 unspecified: Secondary | ICD-10-CM | POA: Diagnosis not present

## 2024-07-25 DIAGNOSIS — I4821 Permanent atrial fibrillation: Secondary | ICD-10-CM | POA: Diagnosis not present

## 2024-07-25 LAB — BASIC METABOLIC PANEL WITH GFR
Anion gap: 12 (ref 5–15)
BUN: 24 mg/dL — ABNORMAL HIGH (ref 8–23)
CO2: 29 mmol/L (ref 22–32)
Calcium: 10.3 mg/dL (ref 8.9–10.3)
Chloride: 96 mmol/L — ABNORMAL LOW (ref 98–111)
Creatinine, Ser: 1.42 mg/dL — ABNORMAL HIGH (ref 0.61–1.24)
GFR, Estimated: 47 mL/min — ABNORMAL LOW (ref >60)
Glucose, Bld: 137 mg/dL — ABNORMAL HIGH (ref 70–99)
Potassium: 4.6 mmol/L (ref 3.5–5.1)
Sodium: 137 mmol/L (ref 135–145)

## 2024-07-25 LAB — BRAIN NATRIURETIC PEPTIDE: B Natriuretic Peptide: 344 pg/mL — ABNORMAL HIGH (ref 0.0–100.0)

## 2024-07-25 LAB — DIGOXIN LEVEL: Digoxin Level: 0.7 ng/mL — ABNORMAL LOW (ref 0.8–2.0)

## 2024-07-25 NOTE — Progress Notes (Signed)
 ReDS Vest / Clip - 07/25/24 1103       ReDS Vest / Clip   Station Marker C    Ruler Value 27    ReDS Value Range Low volume    ReDS Actual Value 30

## 2024-07-25 NOTE — Progress Notes (Signed)
 ADVANCED [facility] CLINIC NOTE                                                                                                                                                                                                                                                                                                                                                                                                                                                                                                   Patient ID: Nathaniel [name]., male   DOB: Apr 17, 1932, 89 y.o.   MRN: [id number] PCP: Nathaniel Garnette KIDD, MD Cardiology: Nathaniel [name]  Chief complaint: f/u for chronic systolic heart failure   89 y.o. with history of extensive vascular disease including CAD s/p CABG and redo CABG, AAA s/p endovascular repair in 2015, PAD, and carotid stenosis as well as prior AVR.                             After his AAA repair, he was noted to be in atrial fibrillation.  He had had a prior episode  of atrial fibrillation after his cardiac surgery in 2007.  Atrial fibrillation was rate-controlled.  He was started on warfarin and Toprol  XL.  He developed exertional dyspnea and fatigue and was cardioverted back to SR 06/06/14.  Echo 06/15 EF worsened to 25-30% with stable bioprosthetic aortic valve, mildly dilated RV with normal systolic function.  LHC 07/15 showed stable anatomy.  The LIMA-LAD was patent, the sequential SVG-OM/ramus/D was patent only to ramus but this is the same as the prior cath, and the SVG-PDA was patent with a long 50-60% mid-graft stenosis.  No intervention.  Of note, at cath he was noted to be back in atrial fibrillation.  He was started on amiodarone  and had DCCV again on 08/21/14.  This was successful.  He has been off amiodarone  due to side effects (hallucinations).  Echo in 1/16 showed recovery of EF to 55-60%.    He was admitted in 1/19 with bronchitis/wheezing/dyspnea and was found to be in atrial  fibrillation with RVR.  He tested positive for metapneumovirus.  Echo showed EF 45-50% with normal bioprosthetic aortic valve. He was diuresed for volume overload while in the hospital. He had DCCV to NSR.    Carotid dopplers in 1/19 showed 80-99% RICA stenosis, he had right CEA in 2/19 with no complications.   At appointment in early 6/20, Nathaniel [name] was noted to be back in atrial fibrillation with RVR and mild CHF.  He was started on Toprol  XL and Lasix  was increased.  He had TEE-guided DCCV to NSR in 6/20.  TEE showed EF 50%, mild diffuse hypokinesis, mildly decreased RV systolic function, normally functioning bioprosthetic aortic valve.   In 2021, he was admitted for Tikosyn  initiation.  In 12/21, he went into atrial fibrillation again and was cardioverted back to NSR.   Echo in 1/22 showed EF 45-50%, diffuse hypokinesis, mildly decreased RV systolic function, moderate biatrial enlargement, bioprosthetic valve with mean gradient 8 mmHg.    In 2/22, he went into atrial fibrillation and had TEE-guided DCCV back to NSR.  TEE showed EF 45-50%.    In 3/22, he was back in the ER with atrial fibrillation triggered by [name] most likely.  He had a cardioversion back to NSR.   In 10/22, patient developed urinary retention and an E coli UTI.  In the setting of this, he had a fall at home and was unable to get up due to weakness for hours.  He developed rhabdomyolysis.  This resolved with minimal renal damage with IV fluid hydration and he was discharged to rehab.   Follow up 2/23 he was in atrial fibrillation, TEE/DCCV arranged however INR subtherapeutic. He cancelled rescheduled procedure due to a death in the family.  Follow up 3/23. Remained in AF. Volume overloaded. Lasix  increased for 3 days. Underwent TEE guided DCCV on 03/19/22.  TEE showed EF 50-55%, mildly decreased RV function, normal bioprosthetic aortic valve, mild Nathaniel and mild mitral stenosis (mean gradient 3 mmHg).    Recently diagnosed  with stage IA2 NSCLC RUL. Saw Radiation Oncology and Pulmonary and decided on empiric treatment with SBRT.  He has completed radiation.   Admitted 9/23 with NSTEMI and AF with RVR. Underwent R/LHC showing normal filling pressures, known occlusion of native coronaries and SVG-RCA from original CABG. New occlusion of SVG-PDA from CABG#2. Long 99% stenosis in the SVG-ramus. Ramus is relatively small vessel. Patent LIMA-LAD with collaterals to RCA territory. Case was discussed with Dr. Wendel, wouldn't benefit from intervention, medically managed. Patient declined re-challenge of  amiodarone  and was able to have successful TEE/DCCV to NSR. TEE showed EF 45% with normal RV, mild-moderate Nathaniel, normal bioprosthetic aortic valve, no LA appendage thrombus. Discharged home, weight 184 lbs.  Follow up 9/23, back in atrial fibrillation with HR 116. Toprol  increased to 25 bid and arranged for TEE/DCCV.  TEE/DCCV (10/23) showed EF 45%, diffuse HK, RV mildly decreased, moderate Nathaniel, Bioprosthetic AoV stable with mean gradient 6 mmHg, no LAA. Proceeded with successful DCCV to NSR.  However, later in 10/23 he was back in AF and has been in AF since that time.   Echo in 5/24 showed EF lower at 30-35%, moderate LV dilation, mild RV dysfunction, moderate-severe Nathaniel, bioprosthetic aortic valve appeared normal, IVC normal.   He was arranged for outpatient RHC done on 03/21/24 which demonstrated elevated right and left heart filling pressures, moderate mixed pulmonary arterial/pulmonary venous hypertension, prominent V-waves in PCWP tracing concerning for significant Nathaniel, as well as low CI and low PAPi (RA 15, PCWP 24 w/ V waves up to 35, PA sat 58%, FICK CI 2.07, PAPi 1.7, PVR 3.8 WU).  He was direct admitted for IV diuretics and further investigation of Nathaniel. Placed on Lasix  gtt. PICC was placed. Co-ox remained stable, did not require inotropic support. He diuresed well on lasix  gtt and transitioned back to torsemide  at 60 mg bid +  HF GDMT. TEE was done to further investigate the mitral valve. Study showed EF 35%, mild LV dilation, mildly dilated RV with mildly decreased systolic function, moderate left atrial enlargement, normally functioning AVR. There was moderate mitral annular calcification with severe mitral regurgitation with a calcified and restricted posterior leaflet. There were 2 jets of mitral regurgitation, PISA ERO 0.21 cm^2 when applied to each jet, so total PISA ERO 0.42 cm^2. MV mean gradient 2 mmHg.  There was flattening but not definite flow reversal in the pulmonary vein systolic doppler pattern. Structural heart team was consulted for consideration for mTEER. Given severe mitral annular calcification, his anatomy was not felt suitable for clip.    He returns today for followup of CHF.  He reports since his last visit, he discontinued both Jardiance  and Metoprolol . He said he felt terrible w/ both and discontinued. Complained of marked fatigue. Also concerned about side effects he heard on a Jardiance  commercial and he does not want to restart. He says he feels much better off meds. Compliant w/ all other meds. Pulse rate 61 bpm. BP 116/62. Denies resting dyspnea. No orthopnea or PND. He reports stable NYHA Class IIb- III symptoms. No chest pain. ReDs 30%.    ECG: not performed  Labs (1/19): K 4.9, creatinine 1.19 Labs (2/19): K 4.2, creatinine 0.89 Labs (3/19): LDL 86, HDL 45, TGs 245 Labs (5/19): LDL 110, HDL 39 Labs (6/19): K 4.1, creatinine 1.07 Labs (11/19): LDL 14, HDL 56 Labs (6/20): K 4.1, creatinine 1.0 Labs (7/20): K 4.3, creatinine 1.07 Labs (9/21): LDL 129, hgb 14.8, K 4.1, creatinine 1.02 Labs (12/21): LDL 20, HDL 46, K 4.4, creatinine 8.76 Labs (1/22): LDL 45, HDL 49, K 4.3, creatinine 1.1, BNP 184 Labs (3/22): K 4.4, creatinine 1.10, LDL 15, HDL 49 Labs (6/22): K 4.3, creatinine 1.11, LDL 121 Labs (7/22): K 4.3, creatinine 1.23, hgb 14.5 Labs (11/22): K 4.5, creatinine 1.14 Labs (2/23):  K 4.3, creatinine 1.16 Labs (7/23): K 4.4, creatinine 1.24 Labs (9/23): K 4.1, creatinine 1.19 Labs (10/23): K 4.2, creatinine 1.31 Labs (1/24): K 4.8, SCr 1.13, Hgb A1c 7.7 Labs (4/24): K 5,  creatinine 1.56 => 1.33, BNP 726 Labs (5/24): K 4.7, SCr 1.29 BNP 895 Labs (5/24): K 4.9, SCr 2.14, BNP 398 Labs (6/24): K 4.6, SCr 1.66  Labs (7/24): LDL 92 Labs (11/24): K 4, creatinine 1.34, pro-BNP 5110 Labs (4/25): K 3.9, creatinine 1.52 => 1.36, BNP 719, digoxin  level 0.7 Labs (7/25): K 4.1, creatinine 1.38, Hgb 6.8, Hgb 13.1   PMH: 1. PAD: Peripheral arterial dopplers in 2012 showed > 75% bilateral SFA stenosis. Peripheral arterial dopplers in 10/14 showed > 50% focal bilateral SFA stenoses.  2. AAA: US  4/15 with 4.4 cm AAA but concern for penetrating ulcers. CTA abdomen showed 4.4 cm AAA with penetrating ulcers and possible pseudoaneurysms. Now s/p endovascular repair of AAA in 5/15 with no complications.  3. Carotid stenosis: Carotid dopplers (4/15) with 60-79% bilateral ICA stenosis. Carotid dopplers (11/15) with 60-79% bilateral ICA stenosis.  Carotid dopplers 6/16 with 60-79% bilateral ICA stenosis. - Carotid dopplers (1/18): 60-79% RICA stenosis.  - Carotid dopplers (1/19): 80-99% RICA stenosis => Right CEA in 2/19.  - Carotid dopplers (11/19): 40-59% bilateral stenosis.  - Carotid dopplers (1/21): 1-39% BICA stenosis.  4. CAD: CABG 1989 with LIMA-LAD, SVG-D, seq SVG-ramus and OM, sequential SVG-PDA/PLV. SVG-PDA/PLV found to be occluded on cath prior to AVR, so patient had SVG-PDA with AVR in 2007. LHC (7/15) with LIMA-LAD patent with 40-50% stenosis in LAD after touchdown, sequential SVG-ramus/OM/diagonal with only the ramus branch still intact (known from prior cath), SVG-PDA from original surgery TO, SVG-PDA from redo surgery with long 50-60% mid-graft stenosis.  No target for intervention.  - LHC (9/23): known occlusion of native coronaries and SVG-RCA from original CABG, new occlusion of  SVG-PDA from CABG#2, long 99% stenosis in the SVG-ramus (was sequential SVG-ramus/OM/D but OM and D branches noted to be lost on prior cath), ramus is relatively small vessel, patent LIMA-LAD with collaterals to RCA territory. Case reviewed with Dr. Wendel, probably not much benefit to intervention on SVG-ramus. Small territory covered and complex disease in SVG. Medical management advised. 5. Severe aortic stenosis: Bioprosthetic AVR in 2007.  6. Atrial fibrillation: Paroxysmal. Initially noted after cardiac surgery, then again after AAA repair.  DCCV to NSR 06/06/14.  DCCV to NSR again 08/21/14.  - DCCV to NSR in 1/19.  - DCCV to NSR in 6/20. .  - DCCV to NSR 12/21, 2/22, 3/22, 4/23, 9/23, 10/23 - Now permanent, off Tikosyn .  7. Type II diabetes  8. Gout  9. GERD  10. Hyperlipidemia  11. HTN 12. GERD  13. Chronic systolic CHF: Echo (6/15) with EF worsened to 25-30% with grade II diastolic dysfunction, stable bioprosthetic aortic valve, mildly dilated RV with normal systolic function.  Echo (1/16) with EF 55-60%, basal inferior hypokinesis, mild LVH, bioprosthetic aortic valve with mean gradient 13 mmHg, mild Nathaniel, severe LAE.  - Echo (1/19): EF 45-50%, mild Nathaniel, normal bioprosthetic aortic valve.  - TEE (6/20): EF 50% with mild diffuse hypokinesis, mildly decreased RV systolic function with mild RV enlargement, bioprosthetic aortic valve looked ok, mild Nathaniel. - Echo (1/22):  EF 45-50%, diffuse hypokinesis, mildly decreased RV systolic function, moderate biatrial enlargement, bioprosthetic valve with mean gradient 8 mmHg.   - TEE (2/22): EF 45-50%, RV normal, moderate biatrial enlargement, bioprosthetic AoV with mean gradient 6 mmHg, mild Nathaniel.  - TEE (4/23): EF 50-55%, mildly decreased RV function, normal bioprosthetic aortic valve, mild Nathaniel and mild mitral stenosis (mean gradient 3 mmHg) - TEE (9/23): EF 45%, RV normal, no LAA thrombus, no  PFO/ASD, mild to moderate Nathaniel w/ moderate MAC, AoV without  stenosis/regurgitation - TEE (10/23): EF 45%, RV mildly decreased, no LAA thrombus, no PFO/ASD, moderate Nathaniel, AoV without stenosis/regurgitation, mean gradient 6 mmHg. - Echo (5/24): EF 30-35%, moderate LV dilation, mild RV dysfunction, moderate-severe Nathaniel, bioprosthetic aortic valve appeared normal, IVC normal.  - RHC (4/25): mean RA 13, PA 51/26 mean 46, mean PCWP 24, CI 2.07, PVR 3.8 WU, PAPi 1.7 - TEE (4/25): EF 35%, mild LV dilation, mildly dilated RV with mildly decreased systolic function, moderate left atrial enlargement, normally functioning bioprosthetic AVR, severe Nathaniel with restricted mitral leaflets.   14. BPH  15. Nonsmall cell lung cancer: Suspected by imaging, treated in 2023 with radiation.  16. Fall with rhabdomyolysis (10/22)  17. Severe Mitral Regurgitation: TEE (4/25) moderate MAC with severe Nathaniel with a calcified and restricted posterior leaflet. There were 2 jets of mitral regurgitation, PISA ERO 0.21 cm^2 when applied to each jet, so total PISA ERO 0.42 cm^2. MV mean gradient 2 mmHg.  There was flattening but not definite flow reversal in the pulmonary vein systolic doppler pattern. - TEE reviewed by structural heart service, thought not to be good mTEER candidate due to posterior leaflet calcification.   SH: Widower, 2 children, raised his 3 grand-daughters, lives in [place], [place].  Occasional ETOH, no smoking.   FH: Father died from ruptured AAA.   ROS: All systems reviewed and negative except as per HPI.   Current Outpatient Medications  Medication Sig Dispense Refill   atorvastatin  (LIPITOR) 10 MG tablet Take 10 mg by mouth daily. (Patient taking differently: Take 10 mg by mouth every other day.)     atorvastatin  (LIPITOR) 20 MG tablet Take 1 tablet by mouth Daily. 90 tablet 3   Coenzyme Q10 (CO Q10) 200 MG CAPS Take 200 mg by mouth daily.     digoxin  (LANOXIN ) 0.125 MG tablet Take 1 tablet (0.125 mg total) by mouth daily. 90 tablet 3   dorzolamide -timolol   (COSOPT ) 22.3-6.8 MG/ML ophthalmic solution Place 1 drop into both eyes 2 (two) times daily.     ezetimibe  (ZETIA ) 10 MG tablet TAKE ONE TABLET BY MOUTH AT BEDTIME 90 tablet 3   latanoprost  (XALATAN ) 0.005 % ophthalmic solution Place 1 drop into both eyes at bedtime.     METAMUCIL FIBER PO Take 1 Dose by mouth as needed (constipation).     Misc Natural Products (GLUCOSAMINE CHONDROITIN TRIPLE) TABS Take 1 tablet by mouth daily.     potassium chloride  SA (KLOR-CON  M) 20 MEQ tablet TAKE ONE TABLET BY MOUTH TWICE DAILY 90 tablet 3   REPATHA  SURECLICK 140 MG/ML SOAJ INJECT 1 PEN INTO SKIN EVERY 14 DAYS 2 mL 11   spironolactone  (ALDACTONE ) 25 MG tablet TAKE ONE TABLET BY MOUTH EVERY DAY 30 tablet 5   tamsulosin  (FLOMAX ) 0.4 MG CAPS capsule TAKE ONE CAPSULE BY MOUTH TWICE DAILY 180 capsule 3   torsemide  (DEMADEX ) 20 MG tablet Take 60 mg by mouth 2 (two) times daily. 2 tablets in the PM     warfarin (COUMADIN ) 3 MG tablet Needs INR appt.  Call office [phone].  Thank you. TAKE TWO TO THREE TABLETS DAILY OR AS DIRECTED BY COUMADIN  CLINIC 15 tablet 0   metoprolol  succinate (TOPROL -XL) 50 MG 24 hr tablet Take 1 tablet (50 mg total) by mouth in the morning and at bedtime. Take with or immediately following a meal. (Patient not taking: Reported on 07/25/2024) 60 tablet 3   No current facility-administered medications for  this encounter.   BP 116/62   Pulse 61   Ht 5' 10 (1.778 m)   Wt 71.8 kg (158 lb 3.2 oz)   SpO2 96%   BMI 22.70 kg/m    Wt Readings from Last 3 Encounters:  07/25/24 71.8 kg (158 lb 3.2 oz)  07/11/24 73.5 kg (162 lb)  07/06/24 73.6 kg (162 lb 3.2 oz)   Vitals:   07/25/24 1103  BP: 116/62  Pulse: 61  SpO2: 96%   Physical Exam  ReDs 30%, normal  GENERAL: Well appearing elderly male, NAD. Walked into clinic w/o any assistance  Lungs- clear  CARDIAC:  JVP: not elevated         Normal rate with regular rhythm. 2/6 Nathaniel murmur.  Trace b/l LEE  ABDOMEN: Soft, non-tender,  non-distended.  EXTREMITIES: Warm and well perfused.  NEUROLOGIC: No obvious FND   Assessment/Plan:  1.  Chronic Systolic Heart Failure: Ischemic cardiomyopathy.  Echo 9/23 with stable EF 45-50%, LV global hypokinesis, RV systolic function normal. Filling pressures normal on RHC (9/23). TEE in 10/23 showed EF 45%, diffuse HK, RV mildly decreased, moderate Nathaniel, bioprosthetic AoV stable with mean gradient 6 mmHg, no LAA. Echo 5/24 EF 30-35%, RV mildly reduced, Nathaniel moderate-severe. Suspect further drop in EF may be tachycardia-mediated with permanent AF.  TEE in 4/25 showed EF 35%, RV mildly reduced, severe Nathaniel. RHC 4/25 demonstrated elevated right and left heart filling pressures, moderate mixed pulmonary arterial/pulmonary venous hypertension, prominent V-waves in PCWP tracing concerning for significant Nathaniel, as well as low CI and low PAPi.  NYHA class IIb-III, stable. Euvolemic on exam and by ReDs, 30% - Continue torsemide  60 qam/40 qpm.  - off Jardiance  and Toprol  XL due to side effects (marked fatigue. Feels much better off meds, does not want to retry)   - Continue Spironolactone  25 mg daily  - Continue Digoxin  0.125 mg daily. Check Dig level   - Check BMP and BNP today  - If creatinine remains stable, may be able to start low dose ARB in future.  BP may not tolerate Entresto.  2. Mitral Regurgitation: prominent V waves on PCWP tracing, up to 35, on 4/25 RHC. Subsequent TEE 4/25 confirmed severe mitral regurgitation with a calcified and restricted posterior leaflet. Mod-severe MAC. There were 2 jets of mitral regurgitation, PISA ERO 0.21 cm^2 when applied to each jet, so total PISA ERO 0.42 cm^2. MV mean gradient 2 mmHg.  There was flattening but not definite flow reversal in the pulmonary vein systolic doppler pattern. He was evaluated by the Structural Heart Team. Given severe mitral annular calcification, his anatomy was not felt suitable for mTEER.    - Medical management.  3.  CAD: s/p CABG and  Redo CABG. LHC 06/2014 showed significant coronary disease w/ no good interventional targets (LIMA-LAD patent with 40-50% stenosis in LAD after touchdown, sequential SVG-ramus/OM/diagonal with only the ramus branch still intact (known from prior cath), SVG-PDA from original surgery TO, SVG-PDA from redo surgery with long 50-60% mid-graft stenosis). NSTEMI 9/23, repeat LHC showed occlusion of the 2nd SVG-PDA and 99% stenosis in the SVG-ramus.  Probably not much benefit to intervention on SVG-ramus.  Small territory covered and complex disease in the SVG.  Stable w/o CP  - Now off ASA w/ warfarin use  - Continue atorvastatin  20 mg daily + Zetia  + Repatha .  Most recent LDL 5/25 was 92 mg/dL. He admits he was not being compliant w/ Repatha  during that time but just restarted 8/7. He  plans to continue biweekly. Repeat LP next visit  4. Atrial fibrillation: Now permanent. Seen by EP, not thought to be good ablation candidate. He is well rate controlled.  - Continue warfarin. INR followed by coumadin  clinic. - Off Toprolol due to fatigue. HR low 60s - Continue digoxin , check dig level  4. Bioprosthetic AVR:  Normal function on TEE 4/25  5. CKD stage 3: B/l SCr ~1.3 - check BMP today  - he stopped e SGLT2i (Jardiance ) due to side effects. He does not want to retry  6. PAD:  AAA s/p EVR, LE PAD + Carotid Artery Disease s/p Rt CEA. No current claudication symptoms.  - Followed by Dr. Serene. - Continue lipid lowering agents per above.   Followup with Nathaniel [name] in  3 months, sooner if symptoms worsen.      Caffie Shed, PA-C  07/25/2024

## 2024-07-25 NOTE — Patient Instructions (Addendum)
 Changes:  STOP METOPROLOL  SUCCINATE   STOP JARDIANCE    Lab Work:  Labs done today, your results will be available in MyChart, we will contact you for abnormal readings.  Follow-Up in: 2-3 MONTHS WITH DR. ROLAN PLEASE CALL OUR OFFICE AROUND SEPTEMBER TO GET SCHEDULED FOR YOUR APPOINTMENT. PHONE NUMBER IS [phone] OPTION 2   At the [facility], you and your health needs are our priority. We have a designated team specialized in the treatment of Heart Failure. This Care Team includes your primary Heart Failure Specialized Cardiologist (physician), Advanced Practice Providers (APPs- Physician Assistants and Nurse Practitioners), and Pharmacist who all work together to provide you with the care you need, when you need it.   You may see any of the following providers on your designated Care Team at your next follow up:  Dr. Toribio Fuel Dr. Ezra ROLAN Dr. Ria Commander Dr. Odis Brownie Greig Mosses, NP Caffie Shed, [place] [facility] [place], [place] Beckey Coe, NP [place] Lee, NP Tinnie Redman, PharmD   Please be sure to bring in all your medications bottles to every appointment.   Need to Contact Us :  If you have any questions or concerns before your next appointment please send us  a message through [place] or call our office at [phone].    TO LEAVE A MESSAGE FOR THE NURSE SELECT OPTION 2, PLEASE LEAVE A MESSAGE INCLUDING: YOUR NAME DATE OF BIRTH CALL BACK NUMBER REASON FOR CALL**this is important as we prioritize the call backs  YOU WILL RECEIVE A CALL BACK THE SAME DAY AS LONG AS YOU CALL BEFORE 4:00 PM

## 2024-08-03 ENCOUNTER — Other Ambulatory Visit: Payer: Self-pay

## 2024-08-03 DIAGNOSIS — I779 Disorder of arteries and arterioles, unspecified: Secondary | ICD-10-CM

## 2024-08-03 DIAGNOSIS — I714 Abdominal aortic aneurysm, without rupture, unspecified: Secondary | ICD-10-CM

## 2024-08-05 ENCOUNTER — Other Ambulatory Visit (HOSPITAL_COMMUNITY): Payer: Self-pay | Admitting: Cardiology

## 2024-08-05 DIAGNOSIS — I4891 Unspecified atrial fibrillation: Secondary | ICD-10-CM

## 2024-08-25 ENCOUNTER — Other Ambulatory Visit (HOSPITAL_COMMUNITY): Payer: Self-pay

## 2024-08-25 ENCOUNTER — Telehealth: Payer: Self-pay | Admitting: *Deleted

## 2024-08-25 DIAGNOSIS — I4891 Unspecified atrial fibrillation: Secondary | ICD-10-CM

## 2024-08-25 NOTE — Telephone Encounter (Signed)
 Called patient after receiving a secure chat that patient was overdue and inquiring about a refill. There was no answer and left a voicemail.  Pt was last seen 05/15/24 and was due 06/15/24. He was called on 06/15/24 when he did not show for the missed appointment and the nursing personnel was not able to get in touch with him.

## 2024-08-28 MED ORDER — WARFARIN SODIUM 3 MG PO TABS: ORAL_TABLET | ORAL | 0 refills | Status: DC

## 2024-08-28 NOTE — Addendum Note (Signed)
 Addended by: DARRELL BRUCKNER on: 08/28/2024 11:06 AM   Modules accepted: Orders

## 2024-09-11 ENCOUNTER — Ambulatory Visit: Attending: Surgery | Admitting: Surgery

## 2024-09-11 ENCOUNTER — Ambulatory Visit

## 2024-09-11 ENCOUNTER — Ambulatory Visit: Admission: RE | Admit: 2024-09-11 | Source: Ambulatory Visit

## 2024-09-11 ENCOUNTER — Ambulatory Visit (HOSPITAL_COMMUNITY): Admission: RE | Admit: 2024-09-11 | Source: Ambulatory Visit

## 2024-09-11 NOTE — Progress Notes (Deleted)
 [facility] [place]  Patient name: Nathaniel [name]. MRN: [id number] DOB: 01-17-1932 Sex: male   REASON FOR VISIT:    Follow up  HISOTRY OF PRESENT ILLNESS:    Nathaniel [name]. is a 89 y.o. male who return today for follow-up.  Have not seen him in over 3 years.  He is status post the following vascular procedures:  04/13/2014: Endovascular abdominal aortic aneurysm repair 02/03/2018: Right carotid endarterectomy (asymptomatic)  He continues to be on Coumadin  for atrial fibrillation.  He takes a statin for hypercholesterolemia.  He is medically managed for hypertension.  He is a former smoker. PAST MEDICAL HISTORY:   Past Medical History:  Diagnosis Date   AAA (abdominal aortic aneurysm)    Atrial fibrillation (HCC)    Atrial fibrillation (HCC)    CAD (coronary artery disease)    Cataract    CHF (congestive heart failure) (HCC)    Chronic kidney disease    Colon polyps    adenomatous   Diabetes mellitus    Diverticulosis    Dysrhythmia    A-Fib   GERD (gastroesophageal reflux disease)    Gout    Hernia, abdominal    History of aortic valve replacement with bioprosthetic valve    2007   Hx: UTI (urinary tract infection)    Hyperlipemia    Hypertension    Internal hemorrhoids    Peripheral vascular disease    Shortness of breath    with exertion     FAMILY HISTORY:   Family History  Problem Relation Age of Onset   Heart disease Mother    Heart disease Father        before age 60   AAA (abdominal aortic aneurysm) Father    Melanoma Sister        with mets. died 92   Colon cancer Neg Hx     SOCIAL HISTORY:   Social History   Tobacco Use   Smoking status: Former    Current packs/day: 0.00    Average packs/day: 0.3 packs/day for 20.0 years (5.0 ttl pk-yrs)    Types: Cigarettes    Start date: 03/23/1972    Quit date: 03/23/1992    Years since quitting: 32.4    Passive exposure: Past   Smokeless  tobacco: Never   Tobacco comments:    Former smoker 09/16/22  Substance Use Topics   Alcohol use: Yes    Alcohol/week: 3.0 standard drinks of alcohol    Types: 3 Glasses of wine per week     ALLERGIES:   Allergies  Allergen Reactions   Barbiturates     Queasy and tired   Grass Extracts [Gramineae Pollens] Other (See Comments)    Sneezing and runny nose   Milk-Related Compounds Other (See Comments)    Just Yogurt.  Makes him very lethargic.   Amiodarone  Anxiety    depression     CURRENT MEDICATIONS:   Current Outpatient Medications  Medication Sig Dispense Refill   atorvastatin  (LIPITOR) 10 MG tablet Take 10 mg by mouth daily. (Patient taking differently: Take 10 mg by mouth every other day.)     atorvastatin  (LIPITOR) 20 MG tablet Take 1 tablet by mouth Daily. 90 tablet 3   Coenzyme Q10 (CO Q10) 200 MG CAPS Take 200 mg by mouth daily.     digoxin  (LANOXIN ) 0.125 MG tablet Take 1 tablet (0.125 mg total) by mouth daily. 90 tablet 3   dorzolamide -timolol  (COSOPT ) 22.3-6.8 MG/ML ophthalmic solution  Place 1 drop into both eyes 2 (two) times daily.     ezetimibe  (ZETIA ) 10 MG tablet TAKE ONE TABLET BY MOUTH AT BEDTIME 90 tablet 3   latanoprost  (XALATAN ) 0.005 % ophthalmic solution Place 1 drop into both eyes at bedtime.     METAMUCIL FIBER PO Take 1 Dose by mouth as needed (constipation).     Misc Natural Products (GLUCOSAMINE CHONDROITIN TRIPLE) TABS Take 1 tablet by mouth daily.     potassium chloride  SA (KLOR-CON  M) 20 MEQ tablet TAKE ONE TABLET BY MOUTH TWICE DAILY 90 tablet 3   REPATHA  SURECLICK 140 MG/ML SOAJ INJECT 1 PEN INTO SKIN EVERY 14 DAYS 2 mL 11   spironolactone  (ALDACTONE ) 25 MG tablet TAKE ONE TABLET BY MOUTH EVERY DAY 30 tablet 5   tamsulosin  (FLOMAX ) 0.4 MG CAPS capsule TAKE ONE CAPSULE BY MOUTH TWICE DAILY 180 capsule 3   torsemide  (DEMADEX ) 20 MG tablet Take 60 mg by mouth 2 (two) times daily. 2 tablets in the PM     warfarin (COUMADIN ) 3 MG tablet TAKE TWO TO  THREE TABLETS DAILY OR AS DIRECTED BY COUMADIN  CLINIC 90 tablet 0   No current facility-administered medications for this visit.    REVIEW OF SYSTEMS:   [X]  denotes positive finding, [ ]  denotes negative finding Cardiac  Comments:  Chest pain or chest pressure: ***   Shortness of breath upon exertion:    Short of breath when lying flat:    Irregular heart rhythm:        Vascular    Pain in calf, thigh, or hip brought on by ambulation:    Pain in feet at night that wakes you up from your sleep:     Blood clot in your veins:    Leg swelling:         Pulmonary    Oxygen at home:    Productive cough:     Wheezing:         Neurologic    Sudden weakness in arms or legs:     Sudden numbness in arms or legs:     Sudden onset of difficulty speaking or slurred speech:    Temporary loss of vision in one eye:     Problems with dizziness:         Gastrointestinal    Blood in stool:     Vomited blood:         Genitourinary    Burning when urinating:     Blood in urine:        Psychiatric    Major depression:         Hematologic    Bleeding problems:    Problems with blood clotting too easily:        Skin    Rashes or ulcers:        Constitutional    Fever or chills:      PHYSICAL EXAM:   There were no vitals filed for this visit.  GENERAL: The patient is a well-nourished male, in no acute distress. The vital signs are documented above. CARDIAC: There is a regular rate and rhythm.  VASCULAR: *** PULMONARY: Non-labored respirations ABDOMEN: Soft and non-tender with normal pitched bowel sounds.  MUSCULOSKELETAL: There are no major deformities or cyanosis. NEUROLOGIC: No focal weakness or paresthesias are detected. SKIN: There are no ulcers or rashes noted. PSYCHIATRIC: The patient has a normal affect.  STUDIES:   ***  MEDICAL ISSUES:   [name]  Serene CLORE, MD, FACS [facility] [phone] Pager [phone]

## 2024-09-13 ENCOUNTER — Ambulatory Visit: Attending: Cardiology | Admitting: Pharmacist

## 2024-09-13 DIAGNOSIS — Z5181 Encounter for therapeutic drug level monitoring: Secondary | ICD-10-CM | POA: Diagnosis not present

## 2024-09-13 DIAGNOSIS — I4891 Unspecified atrial fibrillation: Secondary | ICD-10-CM | POA: Insufficient documentation

## 2024-09-13 LAB — POCT INR: INR: 2.5 (ref 2.0–3.0)

## 2024-09-13 NOTE — Progress Notes (Signed)
 Description   INR 2.5: continue on same dosage of Warfarin 3 tablets daily.  Recheck INR in 5 weeks Pt aware of risks. Stay consistent with greens each week.  Coumadin  Clinic [phone].

## 2024-09-13 NOTE — Patient Instructions (Signed)
 Description   INR 2.5: continue on same dosage of Warfarin 3 tablets daily.  Recheck INR in 5 weeks Pt aware of risks. Stay consistent with greens each week.  Coumadin  Clinic [phone].

## 2024-09-15 ENCOUNTER — Other Ambulatory Visit (HOSPITAL_COMMUNITY): Payer: Self-pay | Admitting: Cardiology

## 2024-09-20 ENCOUNTER — Other Ambulatory Visit: Payer: Self-pay | Admitting: Surgery

## 2024-09-20 DIAGNOSIS — I779 Disorder of arteries and arterioles, unspecified: Secondary | ICD-10-CM

## 2024-09-20 DIAGNOSIS — I714 Abdominal aortic aneurysm, without rupture, unspecified: Secondary | ICD-10-CM

## 2024-10-01 ENCOUNTER — Other Ambulatory Visit: Payer: Self-pay

## 2024-10-01 ENCOUNTER — Emergency Department (HOSPITAL_BASED_OUTPATIENT_CLINIC_OR_DEPARTMENT_OTHER)

## 2024-10-01 ENCOUNTER — Encounter (HOSPITAL_BASED_OUTPATIENT_CLINIC_OR_DEPARTMENT_OTHER): Payer: Self-pay

## 2024-10-01 ENCOUNTER — Emergency Department (HOSPITAL_BASED_OUTPATIENT_CLINIC_OR_DEPARTMENT_OTHER)
Admission: EM | Admit: 2024-10-01 | Discharge: 2024-10-01 | Disposition: A | Discharge status: Home / Self Care | Attending: Emergency Medicine | Admitting: Emergency Medicine

## 2024-10-01 DIAGNOSIS — K5730 Diverticulosis of large intestine without perforation or abscess without bleeding: Secondary | ICD-10-CM | POA: Diagnosis not present

## 2024-10-01 DIAGNOSIS — Z7901 Long term (current) use of anticoagulants: Secondary | ICD-10-CM | POA: Insufficient documentation

## 2024-10-01 DIAGNOSIS — N189 Chronic kidney disease, unspecified: Secondary | ICD-10-CM | POA: Diagnosis not present

## 2024-10-01 DIAGNOSIS — R319 Hematuria, unspecified: Secondary | ICD-10-CM

## 2024-10-01 DIAGNOSIS — I4891 Unspecified atrial fibrillation: Secondary | ICD-10-CM | POA: Insufficient documentation

## 2024-10-01 DIAGNOSIS — I7143 Infrarenal abdominal aortic aneurysm, without rupture: Secondary | ICD-10-CM | POA: Diagnosis not present

## 2024-10-01 DIAGNOSIS — N3289 Other specified disorders of bladder: Secondary | ICD-10-CM | POA: Insufficient documentation

## 2024-10-01 DIAGNOSIS — R109 Unspecified abdominal pain: Secondary | ICD-10-CM | POA: Diagnosis not present

## 2024-10-01 DIAGNOSIS — I2510 Atherosclerotic heart disease of native coronary artery without angina pectoris: Secondary | ICD-10-CM | POA: Insufficient documentation

## 2024-10-01 LAB — COMPREHENSIVE METABOLIC PANEL WITH GFR
ALT: 8 U/L (ref 0–44)
AST: 23 U/L (ref 15–41)
Albumin: 4.2 g/dL (ref 3.5–5.0)
Alkaline Phosphatase: 77 U/L (ref 38–126)
Anion gap: 11 (ref 5–15)
BUN: 21 mg/dL (ref 8–23)
CO2: 27 mmol/L (ref 22–32)
Calcium: 9.8 mg/dL (ref 8.9–10.3)
Chloride: 100 mmol/L (ref 98–111)
Creatinine, Ser: 1.26 mg/dL — ABNORMAL HIGH (ref 0.61–1.24)
GFR, Estimated: 54 mL/min — ABNORMAL LOW (ref >60)
Glucose, Bld: 193 mg/dL — ABNORMAL HIGH (ref 70–99)
Potassium: 4.4 mmol/L (ref 3.5–5.1)
Sodium: 138 mmol/L (ref 135–145)
Total Bilirubin: 1.1 mg/dL (ref 0.0–1.2)
Total Protein: 7.6 g/dL (ref 6.5–8.1)

## 2024-10-01 LAB — CBC WITH DIFFERENTIAL/PLATELET
Abs Immature Granulocytes: 0.02 K/uL (ref 0.00–0.07)
Basophils Absolute: 0.1 K/uL (ref 0.0–0.1)
Basophils Relative: 1 %
Eosinophils Absolute: 0.1 K/uL (ref 0.0–0.5)
Eosinophils Relative: 2 %
HCT: 43.2 % (ref 39.0–52.0)
Hemoglobin: 14.4 g/dL (ref 13.0–17.0)
Immature Granulocytes: 0 %
Lymphocytes Relative: 19 %
Lymphs Abs: 1.4 K/uL (ref 0.7–4.0)
MCH: 30.6 pg (ref 26.0–34.0)
MCHC: 33.3 g/dL (ref 30.0–36.0)
MCV: 91.9 fL (ref 80.0–100.0)
Monocytes Absolute: 0.8 K/uL (ref 0.1–1.0)
Monocytes Relative: 10 %
Neutro Abs: 4.9 K/uL (ref 1.7–7.7)
Neutrophils Relative %: 68 %
Platelets: 177 K/uL (ref 150–400)
RBC: 4.7 MIL/uL (ref 4.22–5.81)
RDW: 13 % (ref 11.5–15.5)
WBC: 7.3 K/uL (ref 4.0–10.5)
nRBC: 0 % (ref 0.0–0.2)

## 2024-10-01 LAB — URINALYSIS, ROUTINE W REFLEX MICROSCOPIC
Bacteria, UA: NONE SEEN
Bilirubin Urine: NEGATIVE
Glucose, UA: 500 mg/dL — AB
Ketones, ur: NEGATIVE mg/dL
Leukocytes,Ua: NEGATIVE
Nitrite: NEGATIVE
Protein, ur: 100 mg/dL — AB
Specific Gravity, Urine: 1.019 (ref 1.005–1.030)
pH: 6.5 (ref 5.0–8.0)

## 2024-10-01 LAB — PROTIME-INR
INR: 2.4 — ABNORMAL HIGH (ref 0.8–1.2)
Prothrombin Time: 27 s — ABNORMAL HIGH (ref 11.4–15.2)

## 2024-10-01 MED ORDER — CEPHALEXIN 500 MG PO CAPS: 500.0000 mg | ORAL_CAPSULE | Freq: Three times a day (TID) | ORAL | 0 refills | Status: AC

## 2024-10-01 MED ORDER — CEPHALEXIN 250 MG PO CAPS
500.0000 mg | ORAL_CAPSULE | Freq: Once | ORAL | Status: AC
  Administered 2024-10-01: 500 mg via ORAL
  Filled 2024-10-01: qty 2

## 2024-10-01 NOTE — ED Triage Notes (Signed)
 Pt c/o hematuria onset yesterday approx 1700. Denies pain, prostate hx. Denies fever, urinary symptoms, nothing but the blood & only in the urine.  Hx afib, denies SHOB, lightheaded/ dizzy feeling & I'm used to that.

## 2024-10-01 NOTE — ED Provider Notes (Signed)
 [place] EMERGENCY DEPARTMENT AT [facility] Provider Note   CSN: [id number] Arrival date & time: 10/01/24  1911     Patient presents with: Hematuria   Nathaniel Cardell. is a 89 y.o. male.   Patient here with hematuria.  Noticed it here yesterday.  He has some prostate issues at baseline.  He has not had any urinary retention.  He is on Coumadin  for her A-fib.  Is not really feeling lightheaded dizzy short of breath or chest pain.  No GU cancer history.  History of CAD.  AAA history.  CKD.  He is not having any pain at this time.  No fever no chills.  The history is provided by the patient.       Prior to Admission medications   Medication Sig Start Date End Date Taking? Authorizing Provider  cephALEXin  (KEFLEX ) 500 MG capsule Take 1 capsule (500 mg total) by mouth 3 (three) times daily for 5 days. 10/01/24 10/06/24 Yes Uriel Dowding, DO  atorvastatin  (LIPITOR) 10 MG tablet Take 10 mg by mouth daily. Patient taking differently: Take 10 mg by mouth every other day. 06/07/24   [provider]  atorvastatin  (LIPITOR) 20 MG tablet Take 1 tablet by mouth Daily. 06/12/24   Rolan Ezra RAMAN, MD  Coenzyme Q10 (CO Q10) 200 MG CAPS Take 200 mg by mouth daily.    [provider]  digoxin  (LANOXIN ) 0.125 MG tablet Take 1 tablet (0.125 mg total) by mouth daily. 03/25/24   Lee, [place], NP  dorzolamide -timolol  (COSOPT ) 22.3-6.8 MG/ML ophthalmic solution Place 1 drop into both eyes 2 (two) times daily.    [provider]  ezetimibe  (ZETIA ) 10 MG tablet TAKE ONE TABLET BY MOUTH AT BEDTIME 02/07/24   Katrinka Garnette KIDD, MD  latanoprost  (XALATAN ) 0.005 % ophthalmic solution Place 1 drop into both eyes at bedtime.    [provider]  METAMUCIL FIBER PO Take 1 Dose by mouth as needed (constipation).    [provider]  Misc Natural Products (GLUCOSAMINE CHONDROITIN TRIPLE) TABS Take 1 tablet by mouth daily.    [provider]  potassium  chloride SA (KLOR-CON  M) 20 MEQ tablet TAKE ONE TABLET BY MOUTH TWICE DAILY 06/07/24   McLean, Dalton S, MD  REPATHA  SURECLICK 140 MG/ML SOAJ INJECT 1 PEN INTO SKIN EVERY 14 DAYS 02/08/24   Rolan Ezra RAMAN, MD  spironolactone  (ALDACTONE ) 25 MG tablet TAKE ONE TABLET BY MOUTH EVERY DAY 08/13/23   Rolan Ezra RAMAN, MD  tamsulosin  (FLOMAX ) 0.4 MG CAPS capsule TAKE ONE CAPSULE BY MOUTH TWICE DAILY 03/25/23   Katrinka Garnette KIDD, MD  torsemide  (DEMADEX ) 20 MG tablet Take 3 tablets (60 mg total) by mouth every morning AND 2 tablets (40 mg total) every evening. 09/15/24   Rolan Ezra RAMAN, MD  warfarin (COUMADIN ) 3 MG tablet TAKE TWO TO THREE TABLETS DAILY OR AS DIRECTED BY COUMADIN  CLINIC 08/28/24   Rolan Ezra RAMAN, MD    Allergies: Barbiturates, Grass extracts [gramineae pollens], Milk-related compounds, and Amiodarone     Review of Systems  Updated Vital Signs BP 132/85   Pulse 98   Temp 98.2 F (36.8 C)   Resp 16   SpO2 96%   Physical Exam Vitals and nursing note reviewed.  Constitutional:      General: He is not in acute distress.    Appearance: He is well-developed. He is not ill-appearing.  HENT:     Head: Normocephalic and atraumatic.     Nose: Nose normal.  Mouth/Throat:     Mouth: Mucous membranes are moist.  Eyes:     Extraocular Movements: Extraocular movements intact.     Conjunctiva/sclera: Conjunctivae normal.     Pupils: Pupils are equal, round, and reactive to light.  Cardiovascular:     Rate and Rhythm: Normal rate and regular rhythm.     Pulses: Normal pulses.     Heart sounds: Normal heart sounds. No murmur heard. Pulmonary:     Effort: Pulmonary effort is normal. No respiratory distress.     Breath sounds: Normal breath sounds.  Abdominal:     Palpations: Abdomen is soft.     Tenderness: There is no abdominal tenderness.  Musculoskeletal:        General: No swelling.     Cervical back: Normal range of motion and neck supple.  Skin:    General: Skin is warm  and dry.     Capillary Refill: Capillary refill takes less than 2 seconds.  Neurological:     Mental Status: He is alert.  Psychiatric:        Mood and Affect: Mood normal.     (all labs ordered are listed, but only abnormal results are displayed) Labs Reviewed  COMPREHENSIVE METABOLIC PANEL WITH GFR - Abnormal; Notable for the following components:      Result Value   Glucose, Bld 193 (*)    Creatinine, Ser 1.26 (*)    GFR, Estimated 54 (*)    All other components within normal limits  PROTIME-INR - Abnormal; Notable for the following components:   Prothrombin Time 27.0 (*)    INR 2.4 (*)    All other components within normal limits  URINALYSIS, ROUTINE W REFLEX MICROSCOPIC - Abnormal; Notable for the following components:   Color, Urine RED (*)    APPearance CLOUDY (*)    Glucose, UA 500 (*)    Hgb urine dipstick LARGE (*)    Protein, ur 100 (*)    All other components within normal limits  URINE CULTURE  CBC WITH DIFFERENTIAL/PLATELET    EKG: None  Radiology: CT Renal Stone Study Result Date: 10/01/2024 EXAM: CT UROGRAM 10/01/2024 08:00:55 PM TECHNIQUE: CT of the abdomen and pelvis was performed without intravenous contrast as per CT urogram protocol. Multiplanar reformatted images as well as MIP urogram images are provided for review. Automated exposure control, iterative reconstruction, and/or weight based adjustment of the mA/kV was utilized to reduce the radiation dose to as low as reasonably achievable. COMPARISON: CT renal 05/21/2014, PET CT 03/30/2022. CLINICAL HISTORY: Abdominal/flank pain, stone suspected. Pt c/o hematuria onset yesterday approx 1700. Denies pain, prostate hx. Denies fever, urinary symptoms, nothing but the blood \\T \ only in the urine. ; Hx afib, denies SHOB, lightheaded/ dizzy feeling \\T \ I'm used to that. FINDINGS: LOWER CHEST: No acute abnormality. LIVER: The liver is unremarkable. GALLBLADDER AND BILE DUCTS: Gallbladder is unremarkable. No  biliary ductal dilatation. SPLEEN: No acute abnormality. PANCREAS: No acute abnormality. ADRENAL GLANDS: No acute abnormality. KIDNEYS, URETERS AND BLADDER: No nephroureterolithiasis bilaterally. No hydroureteronephrosis bilaterally. Calcifications associated with the right kidney are vascular in etiology. Irregular hyperdense layering, left greater than right in the urine bladder wall lumen may represent blood products versus mass -markedly limited evaluation on this noncontrast study. No perinephric or periureteral stranding. GI AND BOWEL: Stomach demonstrates no acute abnormality. Colonic diverticulosis. No small or large bowel wall thickening or dilatation. Grossly stable at least 14 x 9 cm fluid density lesion within the left mid abdomen that appears to  be inseparable from a loop of small bowel as well as the left inferior renal pole. PERITONEUM AND RETROPERITONEUM: No ascites. No free air. Grossly stable similar chronic finding within the right lower pelvis that appears inseparable from the urinary bladder lumen and measures 6.5 x 5.5 cm.Grossly stable at least 14 x 9 cm fluid density lesion within the left mid abdomen that appears to be inseparable from the left inferior renal pole. VASCULATURE: Severe atherosclerotic plaque. Stable 3.8 cm infrarenal abdominal aorta aneurysm status post aortobiiliac stent graft repair. LYMPH NODES: No lymphadenopathy. REPRODUCTIVE ORGANS: Prostate is unremarkable. BONES AND SOFT TISSUES: Multilevel severe degenerative changes of the spine. No acute osseous abnormality. No focal soft tissue abnormality. IMPRESSION: 1. Irregular hyperdense layering left greater than right, within the posterior urinary bladder lumen, concerning for blood products versus mass; recommend urologic consultation for direct visualization. 2. Stable 3.8 cm infrarenal abdominal aortic aneurysm status post aortobiiliac stent graft repair. Electronically signed by: Morgane Naveau MD 10/01/2024 08:12 PM  EDT RP Workstation: [id number]     Procedures   Medications Ordered in the ED  cephALEXin  (KEFLEX ) capsule 500 mg (has no administration in time range)                                    Medical Decision Making Amount and/or Complexity of Data Reviewed Labs: ordered. Radiology: ordered.  Risk Prescription drug management.   Nathaniel Stimpson Shirlean Raddle. is here with hematuria.  History of A-fib on Coumadin .  Denies any prostate cancer or GU history.  Maybe enlarged prostate in the past.  He is not having any abdominal pain.  Noticed blood in the urine since yesterday.  He is not having any urinary retention.  No fever no chills.  He is very well-appearing.  Basic labs including INR CT renal stone study and urinalysis were obtained.  Per my review interpretation of labs is no significant leukocytosis anemia or electrolyte abnormality.  Urinalysis does not appear consistent with infection.  CT study showed may be bladder mass versus blood clot in the bladder but no other acute findings.  Is not having any urinary retention.  Is not having any pain.  I did tell him that he needs to return if he develops urinary retention.  He needs close follow-up with urology for likely cystoscopy to further evaluate issue.  I do suspect that this is likely clot in his bladder.  Will put him on antibiotics to be conservative.  But otherwise I do not see any emergent process.  Does not have any urinary retention he understands to return if this occurs then he understands to follow-up with urology.  Discharged in good condition.   This chart was dictated using voice recognition software.  Despite best efforts to proofread,  errors can occur which can change the documentation meaning.      Final diagnoses:  Hematuria, unspecified type  Bladder mass    ED Discharge Orders          Ordered    cephALEXin  (KEFLEX ) 500 MG capsule  3 times daily        10/01/24 2202               Ruthe Cornet,  DO 10/01/24 2207

## 2024-10-01 NOTE — Discharge Instructions (Addendum)
 If you develop any urinary retention please return for evaluation as we discussed that she will likely need a Foley catheter.  It might be beneficial to go to [facility] long hospital for this in case there is complications and we need urology's help.  As we discussed it is hard to tell if there is a bladder mass or just some blood clots in your bladder.  You need to follow-up with urology to have this further evaluated.  If you develop any urinary retention fever or worsening symptoms please return as we discussed.  I am starting you on some antibiotics to help clean up any possible infection.

## 2024-10-03 DIAGNOSIS — D494 Neoplasm of unspecified behavior of bladder: Secondary | ICD-10-CM | POA: Diagnosis not present

## 2024-10-03 DIAGNOSIS — R310 Gross hematuria: Secondary | ICD-10-CM | POA: Diagnosis not present

## 2024-10-03 LAB — URINE CULTURE

## 2024-10-04 ENCOUNTER — Other Ambulatory Visit: Payer: Self-pay | Admitting: Urology

## 2024-10-04 ENCOUNTER — Telehealth: Payer: Self-pay | Admitting: Cardiology

## 2024-10-04 NOTE — Telephone Encounter (Signed)
 Patient with diagnosis of afib on warfarin for anticoagulation.    Procedure:  Resection of bladder tumor  Date of procedure: 10/10/24   CHA2DS2-VASc Score = 6   This indicates a 9.7% annual risk of stroke. The patient's score is based upon: CHF History: 1 HTN History: 1 Diabetes History: 1 Stroke History: 0 Vascular Disease History: 1 Age Score: 2 Gender Score: 0       Patient has not had an Afib/aflutter ablation in the last 3 months, DCCV within the last 4 weeks or a watchman implanted in the last 45 days   Per office protocol, patient can hold wafarin for 5 days prior to procedure.    Patient will NOT need bridging with Lovenox  (enoxaparin ) around procedure.  **This guidance is not considered finalized until pre-operative APP has relayed final recommendations.**

## 2024-10-04 NOTE — Telephone Encounter (Signed)
 Called and spoke with patient, he asked that someone call him this afternoon because he is working and needs to be near his calendar.

## 2024-10-04 NOTE — Telephone Encounter (Signed)
   Name: Nathaniel [name].  DOB: 1932/10/13  MRN: [id number]  Primary Cardiologist: None   Preoperative team, please contact this patient and set up a phone call appointment for further preoperative risk assessment. Please obtain consent and complete medication review. Thank you for your help.  I confirm that guidance regarding antiplatelet and oral anticoagulation therapy has been completed and, if necessary, noted below.  Per office protocol, patient can hold wafarin for 5 days prior to procedure.     Patient will NOT need bridging with Lovenox  (enoxaparin ) around procedure.  I also confirmed the patient resides in the state of [place] . As per [facility] Medical Board telemedicine laws, the patient must reside in the state in which the provider is licensed.   Lamarr Satterfield, NP 10/04/2024, 1:20 PM [facility]

## 2024-10-04 NOTE — Telephone Encounter (Signed)
   Pre-operative Risk Assessment    Patient Name: Nathaniel [name].  DOB: 03-Dec-1932 MRN: [id number]   Date of last office visit: 11/01/23 Date of next office visit: Not yet scheduled   Request for Surgical Clearance    Procedure:  Resection of bladder tumor  Date of Surgery:  Clearance 10/10/24                                Surgeon:  Dr. Steffan Pea Surgeon's Group or Practice Name:  [name] Phone number:  [phone]  Fax number:  [phone]   Type of Clearance Requested:   - Medical  - Pharmacy:  Hold Warfarin (Coumadin )     Type of Anesthesia:  General    Additional requests/questions:     SignedHerma KATHEE Blush   10/04/2024, 8:57 AM

## 2024-10-04 NOTE — Telephone Encounter (Signed)
 Pharmacy please advise on holding coumadin  prior to resection of bladder tumor scheduled for 10/10/2024  Last labs 10/01/2024. Thank you.

## 2024-10-04 NOTE — Telephone Encounter (Signed)
   Name: Nathaniel [name].  DOB: [birth year]  MRN: [id number]  Primary Cardiologist: None   Preoperative team, please contact this patient and set up a phone call appointment ASAP as procedure is scheduled for 10/10/2024,for further preoperative risk assessment. Please obtain consent and complete medication review. Thank you for your help.  I confirm that guidance regarding antiplatelet and oral anticoagulation therapy has been completed and, if necessary, noted below.  I have requested pharmacy address coumadin  hold.   I also confirmed the patient resides in the state of [place] . As per [facility] Medical Board telemedicine laws, the patient must reside in the state in which the provider is licensed.   Lamarr Satterfield, NP 10/04/2024, 11:15 AM [facility]

## 2024-10-04 NOTE — Telephone Encounter (Signed)
 Patient has been scheduled for televisit this Friday that is the only open slot we had for this week if anything becomes available sooner please move patient up due to needing to be off Warfarin 5 days prior to procedure which is scheduled for Tues 10/28

## 2024-10-05 DIAGNOSIS — R310 Gross hematuria: Secondary | ICD-10-CM | POA: Diagnosis not present

## 2024-10-05 DIAGNOSIS — R3914 Feeling of incomplete bladder emptying: Secondary | ICD-10-CM | POA: Diagnosis not present

## 2024-10-05 DIAGNOSIS — D494 Neoplasm of unspecified behavior of bladder: Secondary | ICD-10-CM | POA: Diagnosis not present

## 2024-10-05 NOTE — Progress Notes (Addendum)
 Called patient multiple times and left voicemail regarding PST appointment at 0800. Patient did not answer. Called grand daughter and asked if she could tell patient to call me back. She stated that he does not usually answer the phone, even for her. She stated that she usually takes care of his medical calls. Grand daughter did not know when patient last took his blood thinner. She stated she is planning to go to his place later today. Instructed to call once she was there.  Emailed instruction to grand daughter.   Patient needs a med rec. Grand daughter does not have list. Will need to do DOS.  Will need to complete admitting task DOS.  COVID Vaccine Completed: yes  Date of COVID positive in last 90 days:  PCP - Garnette Lukes, MD Cardiologist - Ezra Shuck, MD  Cardiac clearance appointment 10/06/24- patient did not answer calls per documentation  Chest x-ray - 03/22/24 Epic EKG - 05/01/24 Epic Stress Test - N/A ECHO - 03/22/24 Epic Cardiac Cath - 03/21/24 Epic Pacemaker/ICD device last checked:N/A Spinal Cord Stimulator:N/A  Bowel Prep - N/A  Sleep Study - N/A CPAP -   Fasting Blood Sugar - no meds Checks Blood Sugar  rarely checks, just when symptomatic  Last dose of GLP1 agonist-  N/A GLP1 instructions:  Do not take after     Last dose of SGLT-2 inhibitors-  N/A SGLT-2 instructions:  Do not take after     Blood Thinner Instructions: Warfarin, hold 5 days- no Lovenox  bridge  Aspirin  Instructions:N/A Last Dose: grand daughter unaware if patient has stopped medication. Update- grand daughter said patient  said he has been off medication well before the weekend- instructed her to tell him he needs to call cariology office today.   Activity level: Can go up a flight of stairs and perform activities of daily living without stopping and without symptoms of chest pain or shortness of breath. Has walker, cane, and wheelchair available  PRN  Anesthesia review: CAD, HTN, PVD,  AAA, CHF, NSTEMI, Afib, DM2, CABG  Patient family member verbalized understanding of instructions that were given to them at the PAT appointment. Patient family member was also instructed that they will need to review over the PAT instructions again at home before surgery.

## 2024-10-06 ENCOUNTER — Ambulatory Visit: Attending: Cardiovascular Disease | Admitting: Nurse Practitioner

## 2024-10-06 NOTE — Telephone Encounter (Signed)
 I will update the requesting office that the preop APP and myself called the pt but got his vm. We both left detailed message about the missed appt for preop clearance.    Procedure is 10/10/24 and our proep team has not other openings on Monday to squeeze the pt in.

## 2024-10-06 NOTE — Telephone Encounter (Signed)
   Patient Name: Nathaniel [name].  DOB: [birth year] MRN: [id number]  Primary Cardiologist: None  Chart reviewed as part of pre-operative protocol coverage.  Patient was scheduled for preop telephone visit at 4:20 PM on 10/06/2024.  Attempted to contact patient to 3 times, no answer. Left voicemail for patient to return call, no reply.  Will reach out to our preop coverage team in order to reschedule patient's televisit.  Of note, patient's procedure is scheduled for 10/10/2024, with request to hold warfarin for 5 days.  Given time constraints, patient may need to reschedule his procedure.  Damien JAYSON Braver, NP 10/06/2024, 4:52 PM

## 2024-10-06 NOTE — Progress Notes (Signed)
 This encounter was created in error - please disregard.

## 2024-10-09 ENCOUNTER — Encounter (HOSPITAL_COMMUNITY)
Admission: RE | Admit: 2024-10-09 | Discharge: 2024-10-09 | Disposition: A | Discharge status: Home / Self Care | Source: Ambulatory Visit | Attending: Urology | Admitting: Urology

## 2024-10-09 ENCOUNTER — Other Ambulatory Visit: Payer: Self-pay

## 2024-10-09 ENCOUNTER — Encounter (HOSPITAL_COMMUNITY): Payer: Self-pay

## 2024-10-09 DIAGNOSIS — E118 Type 2 diabetes mellitus with unspecified complications: Secondary | ICD-10-CM

## 2024-10-09 HISTORY — DX: Acute myocardial infarction, unspecified: I21.9

## 2024-10-09 HISTORY — DX: Scarlet fever, uncomplicated: A38.9

## 2024-10-09 NOTE — Patient Instructions (Addendum)
 SURGICAL WAITING ROOM VISITATION  Patients having surgery or a procedure may have no more than 2 support people in the waiting area - these visitors may rotate.    Children under the age of 30 must have an adult with them who is not the patient.  Visitors with respiratory illnesses are discouraged from visiting and should remain at home.  If the patient needs to stay at the hospital during part of their recovery, the visitor guidelines for inpatient rooms apply. Pre-op nurse will coordinate an appropriate time for 1 support person to accompany patient in pre-op.  This support person may not rotate.    Please refer to the [facility] website for the visitor guidelines for Inpatients (after your surgery is over and you are in a regular room).    Your procedure is scheduled on: 10/10/24   Report to [facility] Main Entrance    Report to admitting at 2:00 PM   Call this number if you have problems the morning of surgery [phone]   Do not eat food or drink liquids :After Midnight.          If you have questions, please contact your surgeon's office.   FOLLOW BOWEL PREP AND ANY ADDITIONAL PRE OP INSTRUCTIONS YOU RECEIVED FROM YOUR SURGEON'S OFFICE!!!     Oral Hygiene is also important to reduce your risk of infection.                                    Remember - BRUSH YOUR TEETH THE MORNING OF SURGERY WITH YOUR REGULAR TOOTHPASTE  DENTURES WILL BE REMOVED PRIOR TO SURGERY PLEASE DO NOT APPLY Poly grip OR ADHESIVES!!!   Stop all vitamins and herbal supplements 7 days before surgery.   Take these medicines the morning of surgery with A SIP OF WATER : Atorvastatin , Tamsulosin    DO NOT TAKE ANY ORAL DIABETIC MEDICATIONS DAY OF YOUR SURGERY  How to Manage Your Diabetes Before and After Surgery  Why is it important to control my blood sugar before and after surgery? Improving blood sugar levels before and after surgery helps healing and can limit problems. A way of  improving blood sugar control is eating a healthy diet by:  Eating less sugar and carbohydrates  Increasing activity/exercise  Talking with your doctor about reaching your blood sugar goals High blood sugars (greater than 180 mg/dL) can raise your risk of infections and slow your recovery, so you will need to focus on controlling your diabetes during the weeks before surgery. Make sure that the doctor who takes care of your diabetes knows about your planned surgery including the date and location.  How do I manage my blood sugar before surgery? Check your blood sugar at least 4 times a day, starting 2 days before surgery, to make sure that the level is not too high or low. Check your blood sugar the morning of your surgery when you wake up and every 2 hours until you get to the Short Stay unit. If your blood sugar is less than 70 mg/dL, you will need to treat for low blood sugar: Do not take insulin . Treat a low blood sugar (less than 70 mg/dL) with  cup of clear juice (cranberry or apple), 4 glucose tablets, OR glucose gel. Recheck blood sugar in 15 minutes after treatment (to make sure it is greater than 70 mg/dL). If your blood sugar is not greater than 70  mg/dL on recheck, call [phone] for further instructions. Report your blood sugar to the short stay nurse when you get to Short Stay.  If you are admitted to the hospital after surgery: Your blood sugar will be checked by the staff and you will probably be given insulin  after surgery (instead of oral diabetes medicines) to make sure you have good blood sugar levels. The goal for blood sugar control after surgery is 80-180 mg/dL.  Reviewed and Endorsed by [facility] Patient Education Committee, August 2015                              You may not have any metal on your body including jewelry, and body piercing             Do not wear lotions, powders, cologne, or deodorant              Men may shave face and neck.   Do not bring  valuables to the hospital. [facility] IS NOT             RESPONSIBLE   FOR VALUABLES.   Contacts, glasses, dentures or bridgework may not be worn into surgery.  DO NOT BRING YOUR HOME MEDICATIONS TO THE HOSPITAL. PHARMACY WILL DISPENSE MEDICATIONS LISTED ON YOUR MEDICATION LIST TO YOU DURING YOUR ADMISSION IN THE HOSPITAL!    Patients discharged on the day of surgery will not be allowed to drive home.  Someone NEEDS to stay with you for the first 24 hours after anesthesia.   Special Instructions: Bring a copy of your healthcare power of attorney and living will documents the day of surgery if you haven't scanned them before.              Please read over the following fact sheets you were given: IF YOU HAVE QUESTIONS ABOUT YOUR PRE-OP INSTRUCTIONS PLEASE CALL [phone] [place].   If you received a COVID test during your pre-op visit  it is requested that you wear a mask when out in public, stay away from anyone that may not be feeling well and notify your surgeon if you develop symptoms. If you test positive for Covid or have been in contact with anyone that has tested positive in the last 10 days please notify you surgeon.    [facility] - Preparing for Surgery Before surgery, you can play an important role.  Because skin is not sterile, your skin needs to be as free of germs as possible.  You can reduce the number of germs on your skin by washing with CHG (chlorahexidine gluconate) soap before surgery.  CHG is an antiseptic cleaner which kills germs and bonds with the skin to continue killing germs even after washing. Please DO NOT use if you have an allergy to CHG or antibacterial soaps.  If your skin becomes reddened/irritated stop using the CHG and inform your nurse when you arrive at Short Stay. Do not shave (including legs and underarms) for at least 48 hours prior to the first CHG shower.  You may shave your face/neck.  Please follow these instructions carefully:  1.  Shower with  CHG Soap the night before surgery and the morning of surgery.  2.  If you choose to wash your hair, wash your hair first as usual with your normal  shampoo.  3.  After you shampoo, rinse your hair and body thoroughly to remove the shampoo.  4.  Use CHG as you would any other liquid soap.  You can apply chg directly to the skin and wash.  Gently with a scrungie or clean washcloth.  5.  Apply the CHG Soap to your body ONLY FROM THE NECK DOWN.   Do   not use on face/ open                           Wound or open sores. Avoid contact with eyes, ears mouth and   genitals (private parts).                       Wash face,  Genitals (private parts) with your normal soap.             6.  Wash thoroughly, paying special attention to the area where your    surgery  will be performed.  7.  Thoroughly rinse your body with warm water  from the neck down.  8.  DO NOT shower/wash with your normal soap after using and rinsing off the CHG Soap.                9.  Pat yourself dry with a clean towel.            10.  Wear clean pajamas.            11.  Place clean sheets on your bed the night of your first shower and do not  sleep with pets. Day of Surgery : Do not apply any CHG, lotions/deodorants the morning of surgery.  Please wear clean clothes to the hospital/surgery center.  FAILURE TO FOLLOW THESE INSTRUCTIONS MAY RESULT IN THE CANCELLATION OF YOUR SURGERY  PATIENT SIGNATURE_________________________________  NURSE SIGNATURE__________________________________  ________________________________________________________________________

## 2024-10-09 NOTE — Progress Notes (Addendum)
 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 former msoker with h/o HTN, GERD, atrial fibrillation, CAD s/p CABG and redo CABG, CHF, AV replacement 2007, AAA s/p endovascular repair in 2015, CKD, DM II, bladder mass scheduled for above procedure 10/10/2024 with Dr. Steffan Shane.   Pt last seen by cardiology 07/25/24. Euvolemic on exam. Mitral regurgitation, has been evaluated by structural heart team, given severe mitral annular calcification anatomy not suitable for mTEER, medical management recommended. Pt stable without chest pain. Pt with permanent atrial fibrillation, on digoxin  and warfarin. Normal bioprosthetic valve on Echo 4/25. He is to follow up with cardio in November.   Pt unable to be contacted by cardiology for preoperative evaluation. Pt unable to be reached by PAT nurse for preop visit.  PAT nurse spoke with pt's grandaughter who states pt last dose of Coumadin  was around Friday October 24th.   Discussed with Dr. Stoltzfus, recommends postponing case so pt can get cardiac clearance and have appropriate Coumdin hold.  Message sent to Dr. Shane, VM left with his scheduler.   Addendum 10/18/2024:  Per cardiology preoperative evaluation 10/10/2024, - RCRI score 2 points. 5% risk of major cardiac event.  - He is planning to undergo TURBT by Dr. Shane. His risk of perioperative cardiac complications using RCRI calculator is low (5%). No contraindication to proceed with surgery from cardiac standpoint, discussed with Dr. Rolan. Will forward note to Dr. Shane as he is going there after this.  Pt reports last dose of Coumadin  10/10/2024, no Lovenox  bridge needed per cardiology note.  VS: There  were no vitals taken for this visit.  PROVIDERS: Katrinka Garnette KIDD, MD is PCP   Cardiologist - Ezra Rolan, MD  LABS: Labs reviewed: Acceptable for surgery. (all labs ordered are listed, but only abnormal results are displayed)  Labs Reviewed - No data to display   IMAGES:   EKG:   CV: Echo 03/22/2024 1. Left ventricular ejection fraction, by estimation, is 35%. The left  ventricle has moderately decreased function. The left ventricular internal  cavity size was mildly dilated. Global hypokinesis with regional  variation.   2. Peak RV-RA gradient 29 mmHg. Right ventricular systolic function is  mildly reduced. The right ventricular size is normal.   3. Left atrial size was moderately dilated. No left atrial/left atrial  appendage thrombus was detected.   4. Right atrial size was mildly dilated.   5. No ASD/PFO by color doppler.   6. Bioprosthetic aortic valve. Trivial perivalvular leakage. Mean  gradient 6 mmHg, no significant stenosis.   7. The mitral valve is abnormal. The posterior leaflet is calcified and  restricted. Severe mitral valve regurgitation via two moderate-appearing  jets. PISA ERO is 0.21 cm^2 for jet 1 and 0.21 cm^2 for jet 2, additive  ERO 0.42 cm^2. Vena contracta area  for the 2 jets was 0.55 cm^2. No evidence of mitral stenosis. The mean  mitral valve gradient is 3.0 mmHg. Moderate mitral annular calcification.   8. Normal caliber thoracic aorta with grade 3 plaque in the descending  thoracic aorta.   9. 3D performed of the mitral valve.  Past Medical History:  Diagnosis Date   AAA (abdominal aortic aneurysm)  Atrial fibrillation (HCC)    Atrial fibrillation (HCC)    CAD (coronary artery disease)    Cataract    CHF (congestive heart failure) (HCC)    Chronic kidney disease    Colon polyps    adenomatous   Diabetes mellitus    Diverticulosis    Dysrhythmia    A-Fib   GERD (gastroesophageal reflux disease)    Gout    Hernia, abdominal     History of aortic valve replacement with bioprosthetic valve    2007   Hx: UTI (urinary tract infection)    Hyperlipemia    Hypertension    Internal hemorrhoids    Myocardial infarction (HCC)    Peripheral vascular disease    Scarlet fever    Shortness of breath    with exertion    Past Surgical History:  Procedure Laterality Date   ABDOMINAL AORTIC ENDOVASCULAR STENT GRAFT N/A 04/13/2014   Procedure: ABDOMINAL AORTIC ENDOVASCULAR STENT GRAFT;  Surgeon: Gaile LELON New, MD;  Location: [facility] OR;  Service: Vascular;  Laterality: N/A;   ANGIOPLASTY     Unsure if he had stents put in    APPENDECTOMY     CARDIAC VALVE SURGERY     CARDIOVERSION N/A 06/06/2014   Procedure: CARDIOVERSION;  Surgeon: Ezra GORMAN Shuck, MD;  Location: [facility] ENDOSCOPY;  Service: Cardiovascular;  Laterality: N/A;   CARDIOVERSION N/A 08/21/2014   Procedure: CARDIOVERSION;  Surgeon: Ezra GORMAN Shuck, MD;  Location: [facility] ENDOSCOPY;  Service: Cardiovascular;  Laterality: N/A;   CARDIOVERSION N/A 12/22/2017   Procedure: CARDIOVERSION;  Surgeon: Shuck Ezra GORMAN, MD;  Location: [facility] ENDOSCOPY;  Service: Cardiovascular;  Laterality: N/A;   CARDIOVERSION N/A 05/25/2019   Procedure: CARDIOVERSION;  Surgeon: Shuck Ezra GORMAN, MD;  Location: [facility] ENDOSCOPY;  Service: Cardiovascular;  Laterality: N/A;   CARDIOVERSION N/A 06/14/2019   Procedure: CARDIOVERSION;  Surgeon: Jeffrie Oneil BROCKS, MD;  Location: [facility] ENDOSCOPY;  Service: Cardiovascular;  Laterality: N/A;   CARDIOVERSION N/A 12/12/2020   Procedure: CARDIOVERSION;  Surgeon: Shuck Ezra GORMAN, MD;  Location: [facility] ENDOSCOPY;  Service: Cardiovascular;  Laterality: N/A;   CARDIOVERSION N/A 01/29/2021   Procedure: CARDIOVERSION;  Surgeon: Shuck Ezra GORMAN, MD;  Location: [facility] ENDOSCOPY;  Service: Cardiovascular;  Laterality: N/A;   CARDIOVERSION N/A 03/19/2022   Procedure: CARDIOVERSION;  Surgeon: Shuck Ezra GORMAN, MD;  Location: [facility] ENDOSCOPY;  Service: Cardiovascular;  Laterality: N/A;   CARDIOVERSION  N/A 08/24/2022   Procedure: CARDIOVERSION;  Surgeon: Shuck Ezra GORMAN, MD;  Location: [facility] ENDOSCOPY;  Service: Cardiovascular;  Laterality: N/A;   CARDIOVERSION N/A 09/23/2022   Procedure: CARDIOVERSION;  Surgeon: Shuck Ezra GORMAN, MD;  Location: [facility] ENDOSCOPY;  Service: Cardiovascular;  Laterality: N/A;   CORONARY ARTERY BYPASS GRAFT     ENDARTERECTOMY Right 02/02/2018   Procedure: ENDARTERECTOMY CAROTID RIGHT;  Surgeon: New Gaile LELON, MD;  Location: [facility];  Service: Vascular;  Laterality: Right;   EYE SURGERY Bilateral    cataract surgery   HERNIA REPAIR     2 operations   LEFT HEART CATHETERIZATION WITH CORONARY/GRAFT ANGIOGRAM N/A 07/10/2014   Procedure: LEFT HEART CATHETERIZATION WITH EL BILE;  Surgeon: Ezra GORMAN Shuck, MD;  Location: [facility] CATH LAB;  Service: Cardiovascular;  Laterality: N/A;   RIGHT HEART CATH N/A 03/21/2024   Procedure: RIGHT HEART CATH;  Surgeon: Shuck Ezra GORMAN, MD;  Location: [facility] INVASIVE CV LAB;  Service: Cardiovascular;  Laterality: N/A;   RIGHT HEART CATH AND CORONARY/GRAFT ANGIOGRAPHY N/A 08/21/2022   Procedure: RIGHT HEART CATH AND CORONARY/GRAFT ANGIOGRAPHY;  Surgeon: Rolan Ezra RAMAN, MD;  Location: [facility] INVASIVE CV LAB;  Service: Cardiovascular;  Laterality: N/A;   TEE WITHOUT CARDIOVERSION N/A 05/25/2019   Procedure: TRANSESOPHAGEAL ECHOCARDIOGRAM (TEE);  Surgeon: Rolan Ezra RAMAN, MD;  Location: [facility] ENDOSCOPY;  Service: Cardiovascular;  Laterality: N/A;   TEE WITHOUT CARDIOVERSION N/A 01/29/2021   Procedure: TRANSESOPHAGEAL ECHOCARDIOGRAM (TEE);  Surgeon: Rolan Ezra RAMAN, MD;  Location: [facility] ENDOSCOPY;  Service: Cardiovascular;  Laterality: N/A;   TEE WITHOUT CARDIOVERSION N/A 03/19/2022   Procedure: TRANSESOPHAGEAL ECHOCARDIOGRAM (TEE);  Surgeon: Rolan Ezra RAMAN, MD;  Location: [facility] ENDOSCOPY;  Service: Cardiovascular;  Laterality: N/A;   TEE WITHOUT CARDIOVERSION N/A 08/24/2022   Procedure: TRANSESOPHAGEAL ECHOCARDIOGRAM (TEE);  Surgeon: Rolan Ezra RAMAN,  MD;  Location: [facility] ENDOSCOPY;  Service: Cardiovascular;  Laterality: N/A;   TEE WITHOUT CARDIOVERSION N/A 09/23/2022   Procedure: TRANSESOPHAGEAL ECHOCARDIOGRAM (TEE);  Surgeon: Rolan Ezra RAMAN, MD;  Location: [facility] ENDOSCOPY;  Service: Cardiovascular;  Laterality: N/A;   TONSILLECTOMY     TRANSESOPHAGEAL ECHOCARDIOGRAM (CATH LAB) N/A 03/22/2024   Procedure: TRANSESOPHAGEAL ECHOCARDIOGRAM;  Surgeon: Rolan Ezra RAMAN, MD;  Location: [facility] INVASIVE CV LAB;  Service: Cardiovascular;  Laterality: N/A;   VIDEO BRONCHOSCOPY Bilateral 12/25/2015   Procedure: VIDEO BRONCHOSCOPY WITHOUT FLUORO;  Surgeon: Tonnie FORBES Flicker, MD;  Location: [facility] ENDOSCOPY;  Service: Cardiopulmonary;  Laterality: Bilateral;    MEDICATIONS:  atorvastatin  (LIPITOR) 10 MG tablet   atorvastatin  (LIPITOR) 20 MG tablet   Coenzyme Q10 (CO Q10) 200 MG CAPS   digoxin  (LANOXIN ) 0.125 MG tablet   dorzolamide -timolol  (COSOPT ) 22.3-6.8 MG/ML ophthalmic solution   ezetimibe  (ZETIA ) 10 MG tablet   latanoprost  (XALATAN ) 0.005 % ophthalmic solution   METAMUCIL FIBER PO   Misc Natural Products (GLUCOSAMINE CHONDROITIN TRIPLE) TABS   potassium chloride  SA (KLOR-CON  M) 20 MEQ tablet   REPATHA  SURECLICK 140 MG/ML SOAJ   spironolactone  (ALDACTONE ) 25 MG tablet   tamsulosin  (FLOMAX ) 0.4 MG CAPS capsule   torsemide  (DEMADEX ) 20 MG tablet   warfarin (COUMADIN ) 3 MG tablet   No current facility-administered medications for this encounter.     Harlene Hoots Ward, PA-C [facility] Pre-Surgical Testing [phone]

## 2024-10-10 ENCOUNTER — Encounter (HOSPITAL_COMMUNITY): Payer: Self-pay

## 2024-10-10 ENCOUNTER — Ambulatory Visit (HOSPITAL_COMMUNITY)
Admission: RE | Admit: 2024-10-10 | Discharge: 2024-10-10 | Disposition: A | Discharge status: Home / Self Care | Source: Ambulatory Visit | Attending: Internal Medicine | Admitting: Internal Medicine

## 2024-10-10 VITALS — BP 126/82 | HR 96 | Ht 70.0 in | Wt 166.0 lb

## 2024-10-10 DIAGNOSIS — Z953 Presence of xenogenic heart valve: Secondary | ICD-10-CM | POA: Insufficient documentation

## 2024-10-10 DIAGNOSIS — I252 Old myocardial infarction: Secondary | ICD-10-CM | POA: Diagnosis not present

## 2024-10-10 DIAGNOSIS — Z951 Presence of aortocoronary bypass graft: Secondary | ICD-10-CM | POA: Diagnosis not present

## 2024-10-10 DIAGNOSIS — Z8679 Personal history of other diseases of the circulatory system: Secondary | ICD-10-CM | POA: Insufficient documentation

## 2024-10-10 DIAGNOSIS — Z79899 Other long term (current) drug therapy: Secondary | ICD-10-CM | POA: Diagnosis not present

## 2024-10-10 DIAGNOSIS — Z7901 Long term (current) use of anticoagulants: Secondary | ICD-10-CM | POA: Insufficient documentation

## 2024-10-10 DIAGNOSIS — I2510 Atherosclerotic heart disease of native coronary artery without angina pectoris: Secondary | ICD-10-CM | POA: Insufficient documentation

## 2024-10-10 DIAGNOSIS — I340 Nonrheumatic mitral (valve) insufficiency: Secondary | ICD-10-CM | POA: Diagnosis not present

## 2024-10-10 DIAGNOSIS — I255 Ischemic cardiomyopathy: Secondary | ICD-10-CM | POA: Insufficient documentation

## 2024-10-10 DIAGNOSIS — Z01818 Encounter for other preprocedural examination: Secondary | ICD-10-CM

## 2024-10-10 DIAGNOSIS — I493 Ventricular premature depolarization: Secondary | ICD-10-CM | POA: Insufficient documentation

## 2024-10-10 DIAGNOSIS — I5022 Chronic systolic (congestive) heart failure: Secondary | ICD-10-CM | POA: Diagnosis not present

## 2024-10-10 DIAGNOSIS — I130 Hypertensive heart and chronic kidney disease with heart failure and stage 1 through stage 4 chronic kidney disease, or unspecified chronic kidney disease: Secondary | ICD-10-CM | POA: Insufficient documentation

## 2024-10-10 DIAGNOSIS — I4821 Permanent atrial fibrillation: Secondary | ICD-10-CM | POA: Insufficient documentation

## 2024-10-10 DIAGNOSIS — I739 Peripheral vascular disease, unspecified: Secondary | ICD-10-CM | POA: Insufficient documentation

## 2024-10-10 DIAGNOSIS — I272 Pulmonary hypertension, unspecified: Secondary | ICD-10-CM | POA: Diagnosis not present

## 2024-10-10 DIAGNOSIS — Z8774 Personal history of (corrected) congenital malformations of heart and circulatory system: Secondary | ICD-10-CM | POA: Diagnosis not present

## 2024-10-10 DIAGNOSIS — N183 Chronic kidney disease, stage 3 unspecified: Secondary | ICD-10-CM | POA: Diagnosis not present

## 2024-10-10 DIAGNOSIS — Z952 Presence of prosthetic heart valve: Secondary | ICD-10-CM | POA: Diagnosis not present

## 2024-10-10 NOTE — Progress Notes (Signed)
 ADVANCED HF CLINIC NOTE                                                                                                                                                                                                                                                                                                                                                                                                                                                                                                   Patient ID: Nathaniel [name]., male   DOB: December 20, 1931, 89 y.o.   MRN: [id number] PCP: Nathaniel Garnette KIDD, MD Cardiology: Dr. Rolan  Chief complaint: f/u for chronic systolic heart failure   89 y.o. with history of extensive vascular disease including CAD s/p CABG and redo CABG, AAA s/p endovascular repair in 2015, PAD, and carotid stenosis as well as prior AVR.                             After his AAA repair, he was noted to be in atrial fibrillation.  He had had a prior episode  of atrial fibrillation after his cardiac surgery in 2007.  Atrial fibrillation was rate-controlled.  He was started on warfarin and Toprol  XL.  He developed exertional dyspnea and fatigue and was cardioverted back to SR 06/06/14.  Echo 06/15 EF worsened to 25-30% with stable bioprosthetic aortic valve, mildly dilated RV with normal systolic function.  LHC 07/15 showed stable anatomy.  The LIMA-LAD was patent, the sequential SVG-OM/ramus/D was patent only to ramus but this is the same as the prior cath, and the SVG-PDA was patent with a long 50-60% mid-graft stenosis.  No intervention.  Of note, at cath he was noted to be back in atrial fibrillation.  He was started on amiodarone  and had DCCV again on 08/21/14.  This was successful.  He has been off amiodarone  due to side effects (hallucinations).  Echo in 1/16 showed recovery of EF to 55-60%.    He was admitted in 1/19 with bronchitis/wheezing/dyspnea and was found to be in atrial  fibrillation with RVR.  He tested positive for metapneumovirus.  Echo showed EF 45-50% with normal bioprosthetic aortic valve. He was diuresed for volume overload while in the hospital. He had DCCV to NSR.    Carotid dopplers in 1/19 showed 80-99% RICA stenosis, he had right CEA in 2/19 with no complications.   At appointment in early 6/20, [name] was noted to be back in atrial fibrillation with RVR and mild CHF.  He was started on Toprol  XL and Lasix  was increased.  He had TEE-guided DCCV to NSR in 6/20.  TEE showed EF 50%, mild diffuse hypokinesis, mildly decreased RV systolic function, normally functioning bioprosthetic aortic valve.   In 2021, he was admitted for Tikosyn  initiation.  In 12/21, he went into atrial fibrillation again and was cardioverted back to NSR.   Echo in 1/22 showed EF 45-50%, diffuse hypokinesis, mildly decreased RV systolic function, moderate biatrial enlargement, bioprosthetic valve with mean gradient 8 mmHg.    In 2/22, he went into atrial fibrillation and had TEE-guided DCCV back to NSR.  TEE showed EF 45-50%.    In 3/22, he was back in the ER with atrial fibrillation triggered by [name] most likely.  He had a cardioversion back to NSR.   In 10/22, patient developed urinary retention and an E coli UTI.  In the setting of this, he had a fall at home and was unable to get up due to weakness for hours.  He developed rhabdomyolysis.  This resolved with minimal renal damage with IV fluid hydration and he was discharged to rehab.   Follow up 2/23 he was in atrial fibrillation, TEE/DCCV arranged however INR subtherapeutic. He cancelled rescheduled procedure due to a death in the family.  Follow up 3/23. Remained in AF. Volume overloaded. Lasix  increased for 3 days. Underwent TEE guided DCCV on 03/19/22.  TEE showed EF 50-55%, mildly decreased RV function, normal bioprosthetic aortic valve, mild MR and mild mitral stenosis (mean gradient 3 mmHg).    Recently diagnosed  with stage IA2 NSCLC RUL. Saw Radiation Oncology and Pulmonary and decided on empiric treatment with SBRT.  He has completed radiation.   Admitted 9/23 with NSTEMI and AF with RVR. Underwent R/LHC showing normal filling pressures, known occlusion of native coronaries and SVG-RCA from original CABG. New occlusion of SVG-PDA from CABG#2. Long 99% stenosis in the SVG-ramus. Ramus is relatively small vessel. Patent LIMA-LAD with collaterals to RCA territory. Case was discussed with Dr. Wendel, wouldn't benefit from intervention, medically managed. Patient declined re-challenge of  amiodarone  and was able to have successful TEE/DCCV to NSR. TEE showed EF 45% with normal RV, mild-moderate MR, normal bioprosthetic aortic valve, no LA appendage thrombus. Discharged home, weight 184 lbs.  Follow up 9/23, back in atrial fibrillation with HR 116. Toprol  increased to 25 bid and arranged for TEE/DCCV.  TEE/DCCV (10/23) showed EF 45%, diffuse HK, RV mildly decreased, moderate MR, Bioprosthetic AoV stable with mean gradient 6 mmHg, no LAA. Proceeded with successful DCCV to NSR.  However, later in 10/23 he was back in AF and has been in AF since that time.   Echo in 5/24 showed EF lower at 30-35%, moderate LV dilation, mild RV dysfunction, moderate-severe MR, bioprosthetic aortic valve appeared normal, IVC normal.   He was arranged for outpatient RHC done on 03/21/24 which demonstrated elevated right and left heart filling pressures, moderate mixed pulmonary arterial/pulmonary venous hypertension, prominent V-waves in PCWP tracing concerning for significant MR, as well as low CI and low PAPi (RA 15, PCWP 24 w/ V waves up to 35, PA sat 58%, FICK CI 2.07, PAPi 1.7, PVR 3.8 WU).  He was direct admitted for IV diuretics and further investigation of MR. Placed on Lasix  gtt. PICC was placed. Co-ox remained stable, did not require inotropic support. He diuresed well on lasix  gtt and transitioned back to torsemide  at 60 mg bid +  HF GDMT. TEE was done to further investigate the mitral valve. Study showed EF 35%, mild LV dilation, mildly dilated RV with mildly decreased systolic function, moderate left atrial enlargement, normally functioning AVR. There was moderate mitral annular calcification with severe mitral regurgitation with a calcified and restricted posterior leaflet. There were 2 jets of mitral regurgitation, PISA ERO 0.21 cm^2 when applied to each jet, so total PISA ERO 0.42 cm^2. MV mean gradient 2 mmHg.  There was flattening but not definite flow reversal in the pulmonary vein systolic doppler pattern. Structural heart team was consulted for consideration for mTEER. Given severe mitral annular calcification, his anatomy was not felt suitable for clip.    Today he returns for AHF follow up for pre-op clearance. Overall feeling ok aside from catheter in his bladder this time. Denies palpitations, CP, dizziness, edema, or PND/Orthopnea. Feels some fullness in his belly. SOB with activity. Appetite too good, loves ice cream. No fever or chills. Does not weight regularly. Stopped taking all medications ~ 1 week ago as she was told to stop some but opted to just stop all. Denies ETOH, tobacco or drug use. Quit digoxin  ~ 1 week ago, felt fatigued while on it.   ECG:a fib with PVCs 94 bpm (Personally reviewed)    PMH: 1. PAD: Peripheral arterial dopplers in 2012 showed > 75% bilateral SFA stenosis. Peripheral arterial dopplers in 10/14 showed > 50% focal bilateral SFA stenoses.  2. AAA: US  4/15 with 4.4 cm AAA but concern for penetrating ulcers. CTA abdomen showed 4.4 cm AAA with penetrating ulcers and possible pseudoaneurysms. Now s/p endovascular repair of AAA in 5/15 with no complications.  3. Carotid stenosis: Carotid dopplers (4/15) with 60-79% bilateral ICA stenosis. Carotid dopplers (11/15) with 60-79% bilateral ICA stenosis.  Carotid dopplers 6/16 with 60-79% bilateral ICA stenosis. - Carotid dopplers (1/18): 60-79%  RICA stenosis.  - Carotid dopplers (1/19): 80-99% RICA stenosis => Right CEA in 2/19.  - Carotid dopplers (11/19): 40-59% bilateral stenosis.  - Carotid dopplers (1/21): 1-39% BICA stenosis.  4. CAD: CABG 1989 with LIMA-LAD, SVG-D, seq SVG-ramus and OM, sequential SVG-PDA/PLV. SVG-PDA/PLV found to be  occluded on cath prior to AVR, so patient had SVG-PDA with AVR in 2007. LHC (7/15) with LIMA-LAD patent with 40-50% stenosis in LAD after touchdown, sequential SVG-ramus/OM/diagonal with only the ramus branch still intact (known from prior cath), SVG-PDA from original surgery TO, SVG-PDA from redo surgery with long 50-60% mid-graft stenosis.  No target for intervention.  - LHC (9/23): known occlusion of native coronaries and SVG-RCA from original CABG, new occlusion of SVG-PDA from CABG#2, long 99% stenosis in the SVG-ramus (was sequential SVG-ramus/OM/D but OM and D branches noted to be lost on prior cath), ramus is relatively small vessel, patent LIMA-LAD with collaterals to RCA territory. Case reviewed with Dr. Wendel, probably not much benefit to intervention on SVG-ramus. Small territory covered and complex disease in SVG. Medical management advised. 5. Severe aortic stenosis: Bioprosthetic AVR in 2007.  6. Atrial fibrillation: Paroxysmal. Initially noted after cardiac surgery, then again after AAA repair.  DCCV to NSR 06/06/14.  DCCV to NSR again 08/21/14.  - DCCV to NSR in 1/19.  - DCCV to NSR in 6/20. .  - DCCV to NSR 12/21, 2/22, 3/22, 4/23, 9/23, 10/23 - Now permanent, off Tikosyn .  7. Type II diabetes  8. Gout  9. GERD  10. Hyperlipidemia  11. HTN 12. GERD  13. Chronic systolic CHF: Echo (6/15) with EF worsened to 25-30% with grade II diastolic dysfunction, stable bioprosthetic aortic valve, mildly dilated RV with normal systolic function.  Echo (1/16) with EF 55-60%, basal inferior hypokinesis, mild LVH, bioprosthetic aortic valve with mean gradient 13 mmHg, mild MR, severe LAE.  - Echo  (1/19): EF 45-50%, mild MR, normal bioprosthetic aortic valve.  - TEE (6/20): EF 50% with mild diffuse hypokinesis, mildly decreased RV systolic function with mild RV enlargement, bioprosthetic aortic valve looked ok, mild MR. - Echo (1/22):  EF 45-50%, diffuse hypokinesis, mildly decreased RV systolic function, moderate biatrial enlargement, bioprosthetic valve with mean gradient 8 mmHg.   - TEE (2/22): EF 45-50%, RV normal, moderate biatrial enlargement, bioprosthetic AoV with mean gradient 6 mmHg, mild MR.  - TEE (4/23): EF 50-55%, mildly decreased RV function, normal bioprosthetic aortic valve, mild MR and mild mitral stenosis (mean gradient 3 mmHg) - TEE (9/23): EF 45%, RV normal, no LAA thrombus, no PFO/ASD, mild to moderate MR w/ moderate MAC, AoV without stenosis/regurgitation - TEE (10/23): EF 45%, RV mildly decreased, no LAA thrombus, no PFO/ASD, moderate MR, AoV without stenosis/regurgitation, mean gradient 6 mmHg. - Echo (5/24): EF 30-35%, moderate LV dilation, mild RV dysfunction, moderate-severe MR, bioprosthetic aortic valve appeared normal, IVC normal.  - RHC (4/25): mean RA 13, PA 51/26 mean 46, mean PCWP 24, CI 2.07, PVR 3.8 WU, PAPi 1.7 - TEE (4/25): EF 35%, mild LV dilation, mildly dilated RV with mildly decreased systolic function, moderate left atrial enlargement, normally functioning bioprosthetic AVR, severe MR with restricted mitral leaflets.   14. BPH  15. Nonsmall cell lung cancer: Suspected by imaging, treated in 2023 with radiation.  16. Fall with rhabdomyolysis (10/22)  17. Severe Mitral Regurgitation: TEE (4/25) moderate MAC with severe MR with a calcified and restricted posterior leaflet. There were 2 jets of mitral regurgitation, PISA ERO 0.21 cm^2 when applied to each jet, so total PISA ERO 0.42 cm^2. MV mean gradient 2 mmHg.  There was flattening but not definite flow reversal in the pulmonary vein systolic doppler pattern. - TEE reviewed by structural heart service,  thought not to be good mTEER candidate due to posterior leaflet calcification.  SH: Widower, 2 children, raised his 3 grand-daughters, lives in [place], [place].  Occasional ETOH, no smoking.   FH: Father died from ruptured AAA.   ROS: All systems reviewed and negative except as per HPI.   Current Outpatient Medications  Medication Sig Dispense Refill   atorvastatin  (LIPITOR) 10 MG tablet Take 10 mg by mouth daily. (Patient taking differently: Take 10 mg by mouth every other day.)     atorvastatin  (LIPITOR) 20 MG tablet Take 1 tablet by mouth Daily. 90 tablet 3   Coenzyme Q10 (CO Q10) 200 MG CAPS Take 200 mg by mouth daily.     digoxin  (LANOXIN ) 0.125 MG tablet Take 1 tablet (0.125 mg total) by mouth daily. 90 tablet 3   dorzolamide -timolol  (COSOPT ) 22.3-6.8 MG/ML ophthalmic solution Place 1 drop into both eyes 2 (two) times daily.     ezetimibe  (ZETIA ) 10 MG tablet TAKE ONE TABLET BY MOUTH AT BEDTIME 90 tablet 3   latanoprost  (XALATAN ) 0.005 % ophthalmic solution Place 1 drop into both eyes at bedtime.     METAMUCIL FIBER PO Take 1 Dose by mouth as needed (constipation).     Misc Natural Products (GLUCOSAMINE CHONDROITIN TRIPLE) TABS Take 1 tablet by mouth daily.     potassium chloride  SA (KLOR-CON  M) 20 MEQ tablet TAKE ONE TABLET BY MOUTH TWICE DAILY 90 tablet 3   REPATHA  SURECLICK 140 MG/ML SOAJ INJECT 1 PEN INTO SKIN EVERY 14 DAYS 2 mL 11   spironolactone  (ALDACTONE ) 25 MG tablet TAKE ONE TABLET BY MOUTH EVERY DAY 30 tablet 5   tamsulosin  (FLOMAX ) 0.4 MG CAPS capsule TAKE ONE CAPSULE BY MOUTH TWICE DAILY 180 capsule 3   torsemide  (DEMADEX ) 20 MG tablet Take 3 tablets (60 mg total) by mouth every morning AND 2 tablets (40 mg total) every evening. 180 tablet 1   warfarin (COUMADIN ) 3 MG tablet TAKE TWO TO THREE TABLETS DAILY OR AS DIRECTED BY COUMADIN  CLINIC 90 tablet 0   No current facility-administered medications for this encounter.   BP 126/82   Pulse 96   Ht 5' 10 (1.778  m)   Wt 75.3 kg (166 lb)   SpO2 98%   BMI 23.82 kg/m    Wt Readings from Last 3 Encounters:  10/10/24 75.3 kg (166 lb)  07/25/24 71.8 kg (158 lb 3.2 oz)  07/11/24 73.5 kg (162 lb)   Vitals:   10/10/24 1050  BP: 126/82  Pulse: 96  SpO2: 98%    Physical Exam  General:  well appearing.  No respiratory difficulty. Walked into clinic  Neck: JVD ~7 cm.  Cor: Regular rate & irregular rhythm. No murmurs. Lungs: clear, diminished bases Extremities: no edema  Neuro: alert & oriented x 3. Affect pleasant.   ReDs reading: 32 %, normal   Assessment/Plan:  1.  Chronic Systolic Heart Failure: Ischemic cardiomyopathy.  Echo 9/23 with stable EF 45-50%, LV global hypokinesis, RV systolic function normal. Filling pressures normal on RHC (9/23). TEE in 10/23 showed EF 45%, diffuse HK, RV mildly decreased, moderate MR, bioprosthetic AoV stable with mean gradient 6 mmHg, no LAA. Echo 5/24 EF 30-35%, RV mildly reduced, MR moderate-severe. Suspect further drop in EF may be tachycardia-mediated with permanent AF.  TEE in 4/25 showed EF 35%, RV mildly reduced, severe MR. RHC 4/25 demonstrated elevated right and left heart filling pressures, moderate mixed pulmonary arterial/pulmonary venous hypertension, prominent V-waves in PCWP tracing concerning for significant MR, as well as low CI and low PAPi.   - NYHA class  IIb-III, stable. Euvolemic on exam and by ReDs, 32%  - I asked him to restart torsemide  60 qam/40 qpm to avoid fluid build up prior to procedure. Restart KDUR as well.  - off Jardiance , Toprol  XL and now digoxin  due to side effects (marked fatigue. Feels much better off meds, does not want to retry)   - Restart Spironolactone  25 mg daily  - Labs reviewed from 10/01/24. SCr 1.26, K 4.4 - If creatinine remains stable, may be able to start low dose ARB in future.  BP may not tolerate Entresto.   2. Mitral Regurgitation: prominent V waves on PCWP tracing, up to 35, on 4/25 RHC. Subsequent TEE 4/25  confirmed severe mitral regurgitation with a calcified and restricted posterior leaflet. Mod-severe MAC. There were 2 jets of mitral regurgitation, PISA ERO 0.21 cm^2 when applied to each jet, so total PISA ERO 0.42 cm^2. MV mean gradient 2 mmHg.  There was flattening but not definite flow reversal in the pulmonary vein systolic doppler pattern. He was evaluated by the Structural Heart Team. Given severe mitral annular calcification, his anatomy was not felt suitable for mTEER.    - Medical management.   3.  CAD: s/p CABG and Redo CABG. LHC 06/2014 showed significant coronary disease w/ no good interventional targets (LIMA-LAD patent with 40-50% stenosis in LAD after touchdown, sequential SVG-ramus/OM/diagonal with only the ramus branch still intact (known from prior cath), SVG-PDA from original surgery TO, SVG-PDA from redo surgery with long 50-60% mid-graft stenosis). NSTEMI 9/23, repeat LHC showed occlusion of the 2nd SVG-PDA and 99% stenosis in the SVG-ramus.  Probably not much benefit to intervention on SVG-ramus.  Small territory covered and complex disease in the SVG.  Stable w/o CP  - Now off ASA w/ warfarin use  - Continue atorvastatin  20 mg daily + Zetia  + Repatha .  Most recent LDL 5/25 was 92 mg/dL. Repeat LP next visit (ok to restart post procedure)  4. Atrial fibrillation: Now permanent. Seen by EP, not thought to be good ablation candidate. He is well rate controlled.  - Continue warfarin. INR followed by coumadin  clinic. - Off Toprolol and digoxin  due to fatigue. HR 90s  4. Bioprosthetic AVR:  Normal function on TEE 4/25   5. CKD stage 3: B/l SCr ~1.3 - he stopped SGLT2i (Jardiance ) due to side effects. He does not want to retry   6. PAD:  AAA s/p EVR, LE PAD + Carotid Artery Disease s/p Rt CEA. No current claudication symptoms.  - Followed by Dr. Serene. - Continue lipid lowering agents per above.   7. Surgical clearance:  - Planned procedure TURBT - RCRI score 2 points. 5% risk  of major cardiac event.  - He is planning to undergo TURBT by Dr. Shane. His risk of perioperative cardiac complications using RCRI calculator is low (5%). No contraindication to proceed with surgery from cardiac standpoint, discussed with Dr. Rolan. Will forward note to Dr. Shane as he is going there after this.   Followup with Dr. Rolan in  3 months, sooner if symptoms worsen.     Beckey LITTIE Coe, AGACNP-BC  10/10/2024

## 2024-10-10 NOTE — Patient Instructions (Addendum)
 STOP Digoxin    RESTART Torsemide  as current dose  RESTART Spironolactone  at current dose  RESTART Potassium at current dose.  Your physician recommends that you schedule a follow-up appointment in: 3 months ( January 206) ** PLEASE CALL THE OFFICE IN DECEMBER TO ARRANGE YOUR FOLLOW UP APPOINTMENT.**  If you have any questions or concerns before your next appointment please send us  a message through [place] or call our office at [phone].    TO LEAVE A MESSAGE FOR THE NURSE SELECT OPTION 2, PLEASE LEAVE A MESSAGE INCLUDING: YOUR NAME DATE OF BIRTH CALL BACK NUMBER REASON FOR CALL**this is important as we prioritize the call backs  YOU WILL RECEIVE A CALL BACK THE SAME DAY AS LONG AS YOU CALL BEFORE 4:00 PM  At the [facility], you and your health needs are our priority. As part of our continuing mission to provide you with exceptional heart care, we have created designated Provider Care Teams. These Care Teams include your primary Cardiologist (physician) and Advanced Practice Providers (APPs- Physician Assistants and Nurse Practitioners) who all work together to provide you with the care you need, when you need it.   You may see any of the following providers on your designated Care Team at your next follow up: Dr Toribio Fuel Dr Ezra Shuck Dr. Morene Brownie Greig Mosses, NP Caffie Shed, [place] [facility] [place], [place] Beckey Coe, NP Jordan Lee, NP Ellouise Class, NP Tinnie Redman, PharmD Jaun Bash, PharmD   Please be sure to bring in all your medications bottles to every appointment.    Thank you for choosing [facility]

## 2024-10-10 NOTE — Telephone Encounter (Signed)
 I will reach out to the requesting office that we have not been able to reach the pt. Procedure was scheduled for today 10/10/24. I will f/u with surgeon office if clearance still needed.

## 2024-10-10 NOTE — Progress Notes (Signed)
 ReDS Vest / Clip - 10/10/24 1050       ReDS Vest / Clip   Station Marker C    Ruler Value 33.5    ReDS Value Range Low volume    ReDS Actual Value 32

## 2024-10-10 NOTE — Telephone Encounter (Signed)
 Left message for the pt to call back to reschedule his missed tele preop appt . Dr. Shane had his surgery scheduler move procedure to 10/19/24.

## 2024-10-11 NOTE — Telephone Encounter (Signed)
 This is correct. Clearance was sent on 10/04/2024.

## 2024-10-11 NOTE — Telephone Encounter (Signed)
 I was able to reach the pt today. He tells me that he saw Dr. Rolan yesterday and Dr. Rolan cleared him for his procedure with Dr. Shane. Pt also tells me that he has been off his coumadin  x 5 days as he planned for his previous procedure date. Procedure is now 10/19/24 per Dr. Shane. Pt tells me that Dr. Shane said if he can get him in sooner for procedure his surgery scheduler will call him.   I will update all parties involved.

## 2024-10-12 ENCOUNTER — Ambulatory Visit: Admitting: Family Medicine

## 2024-10-16 ENCOUNTER — Encounter (HOSPITAL_COMMUNITY): Admission: RE | Admit: 2024-10-16 | Source: Ambulatory Visit

## 2024-10-17 ENCOUNTER — Telehealth: Payer: Self-pay | Admitting: Cardiology

## 2024-10-17 ENCOUNTER — Encounter (HOSPITAL_COMMUNITY): Payer: Self-pay | Admitting: Urology

## 2024-10-17 NOTE — Telephone Encounter (Signed)
 Patient cancelled 11/05 INR and did not want to reschedule right now. He has a procedure scheduled for 11/06 and he has to meet with the surgeon on 11/05. He says he will call back to reschedule after he has procedure.

## 2024-10-17 NOTE — Progress Notes (Signed)
 Updated date of surgery:10/19/24  Updated time of arrival: 600AM  Last dose of Coumadin  10/10/24 at 5 PM  Patient will be discharged from hospital and monitored at home for 24 hours by: Asberry Stager [phone]  Patient denies any changes in allergies, medications, medical history since pre op appointment on: 10/09/24  Pre op instructions reviewed, follow up questions addressed and patient verbalized understanding at this time.

## 2024-10-18 ENCOUNTER — Ambulatory Visit

## 2024-10-18 NOTE — H&P (Signed)
 H&P  Chief Complaint: Gross hematuria concern for malignancy  History of Present Illness: 89 year old male presents to send in clinic as an ER follow-up initially seen on 07/01/2024 for gross hematuria CT demonstrated a possible tumor of the posterior bladder wall patient was a smoker history of 30 years no history of GU cancer he also has a fluid-filled mass in the posterior abdomen that appears to be stable.  Catheter was placed as a gross materia since then he was started on Keflex  prior to surgery today.  PMH: Medications: On warfarin has stopped.  Past Medical History:  Diagnosis Date   AAA (abdominal aortic aneurysm)    Atrial fibrillation (HCC)    CAD (coronary artery disease)    Cataract    CHF (congestive heart failure) (HCC)    Chronic kidney disease    Colon polyps    adenomatous   Diabetes mellitus    Diverticulosis    Dysrhythmia    A-Fib   Foley catheter in place    GERD (gastroesophageal reflux disease)    Gout    Hernia, abdominal    History of aortic valve replacement with bioprosthetic valve    2007   Hx: UTI (urinary tract infection)    Hyperlipemia    Hypertension    Internal hemorrhoids    Myocardial infarction (HCC)    Peripheral vascular disease    Scarlet fever    Shortness of breath    with exertion   Past Surgical History:  Procedure Laterality Date   ABDOMINAL AORTIC ENDOVASCULAR STENT GRAFT N/A 04/13/2014   Procedure: ABDOMINAL AORTIC ENDOVASCULAR STENT GRAFT;  Surgeon: Gaile LELON New, MD;  Location: [facility] OR;  Service: Vascular;  Laterality: N/A;   ANGIOPLASTY     Unsure if he had stents put in    APPENDECTOMY     CARDIAC VALVE SURGERY     CARDIOVERSION N/A 06/06/2014   Procedure: CARDIOVERSION;  Surgeon: Ezra GORMAN Shuck, MD;  Location: [facility] ENDOSCOPY;  Service: Cardiovascular;  Laterality: N/A;   CARDIOVERSION N/A 08/21/2014   Procedure: CARDIOVERSION;  Surgeon: Ezra GORMAN Shuck, MD;  Location: [facility] ENDOSCOPY;  Service: Cardiovascular;  Laterality:  N/A;   CARDIOVERSION N/A 12/22/2017   Procedure: CARDIOVERSION;  Surgeon: Shuck Ezra GORMAN, MD;  Location: [facility] ENDOSCOPY;  Service: Cardiovascular;  Laterality: N/A;   CARDIOVERSION N/A 05/25/2019   Procedure: CARDIOVERSION;  Surgeon: Shuck Ezra GORMAN, MD;  Location: [facility] ENDOSCOPY;  Service: Cardiovascular;  Laterality: N/A;   CARDIOVERSION N/A 06/14/2019   Procedure: CARDIOVERSION;  Surgeon: Jeffrie Oneil BROCKS, MD;  Location: [facility] ENDOSCOPY;  Service: Cardiovascular;  Laterality: N/A;   CARDIOVERSION N/A 12/12/2020   Procedure: CARDIOVERSION;  Surgeon: Shuck Ezra GORMAN, MD;  Location: [facility] ENDOSCOPY;  Service: Cardiovascular;  Laterality: N/A;   CARDIOVERSION N/A 01/29/2021   Procedure: CARDIOVERSION;  Surgeon: Shuck Ezra GORMAN, MD;  Location: [facility] ENDOSCOPY;  Service: Cardiovascular;  Laterality: N/A;   CARDIOVERSION N/A 03/19/2022   Procedure: CARDIOVERSION;  Surgeon: Shuck Ezra GORMAN, MD;  Location: [facility] ENDOSCOPY;  Service: Cardiovascular;  Laterality: N/A;   CARDIOVERSION N/A 08/24/2022   Procedure: CARDIOVERSION;  Surgeon: Shuck Ezra GORMAN, MD;  Location: [facility] ENDOSCOPY;  Service: Cardiovascular;  Laterality: N/A;   CARDIOVERSION N/A 09/23/2022   Procedure: CARDIOVERSION;  Surgeon: Shuck Ezra GORMAN, MD;  Location: [facility] ENDOSCOPY;  Service: Cardiovascular;  Laterality: N/A;   CORONARY ARTERY BYPASS GRAFT     ENDARTERECTOMY Right 02/02/2018   Procedure: ENDARTERECTOMY CAROTID RIGHT;  Surgeon: New Gaile LELON, MD;  Location: [facility] OR;  Service: Vascular;  Laterality: Right;   EYE SURGERY Bilateral    cataract surgery   HERNIA REPAIR     2 operations   LEFT HEART CATHETERIZATION WITH CORONARY/GRAFT ANGIOGRAM N/A 07/10/2014   Procedure: LEFT HEART CATHETERIZATION WITH EL BILE;  Surgeon: Ezra GORMAN Shuck, MD;  Location: [facility] CATH LAB;  Service: Cardiovascular;  Laterality: N/A;   RIGHT HEART CATH N/A 03/21/2024   Procedure: RIGHT HEART CATH;  Surgeon: Shuck Ezra GORMAN, MD;  Location: [facility] INVASIVE CV LAB;   Service: Cardiovascular;  Laterality: N/A;   RIGHT HEART CATH AND CORONARY/GRAFT ANGIOGRAPHY N/A 08/21/2022   Procedure: RIGHT HEART CATH AND CORONARY/GRAFT ANGIOGRAPHY;  Surgeon: Shuck Ezra GORMAN, MD;  Location: [facility] INVASIVE CV LAB;  Service: Cardiovascular;  Laterality: N/A;   TEE WITHOUT CARDIOVERSION N/A 05/25/2019   Procedure: TRANSESOPHAGEAL ECHOCARDIOGRAM (TEE);  Surgeon: Shuck Ezra GORMAN, MD;  Location: [facility] ENDOSCOPY;  Service: Cardiovascular;  Laterality: N/A;   TEE WITHOUT CARDIOVERSION N/A 01/29/2021   Procedure: TRANSESOPHAGEAL ECHOCARDIOGRAM (TEE);  Surgeon: Shuck Ezra GORMAN, MD;  Location: [facility] ENDOSCOPY;  Service: Cardiovascular;  Laterality: N/A;   TEE WITHOUT CARDIOVERSION N/A 03/19/2022   Procedure: TRANSESOPHAGEAL ECHOCARDIOGRAM (TEE);  Surgeon: Shuck Ezra GORMAN, MD;  Location: [facility] ENDOSCOPY;  Service: Cardiovascular;  Laterality: N/A;   TEE WITHOUT CARDIOVERSION N/A 08/24/2022   Procedure: TRANSESOPHAGEAL ECHOCARDIOGRAM (TEE);  Surgeon: Shuck Ezra GORMAN, MD;  Location: [facility] ENDOSCOPY;  Service: Cardiovascular;  Laterality: N/A;   TEE WITHOUT CARDIOVERSION N/A 09/23/2022   Procedure: TRANSESOPHAGEAL ECHOCARDIOGRAM (TEE);  Surgeon: Shuck Ezra GORMAN, MD;  Location: [facility] ENDOSCOPY;  Service: Cardiovascular;  Laterality: N/A;   TONSILLECTOMY     TRANSESOPHAGEAL ECHOCARDIOGRAM (CATH LAB) N/A 03/22/2024   Procedure: TRANSESOPHAGEAL ECHOCARDIOGRAM;  Surgeon: Shuck Ezra GORMAN, MD;  Location: [facility] INVASIVE CV LAB;  Service: Cardiovascular;  Laterality: N/A;   VIDEO BRONCHOSCOPY Bilateral 12/25/2015   Procedure: VIDEO BRONCHOSCOPY WITHOUT FLUORO;  Surgeon: Tonnie FORBES Flicker, MD;  Location: [facility] ENDOSCOPY;  Service: Cardiopulmonary;  Laterality: Bilateral;    Home Medications:  No medications prior to admission.   Allergies:  Allergies  Allergen Reactions   Barbiturates     Queasy and tired   Grass Extracts [Gramineae Pollens] Other (See Comments)    Sneezing and runny nose   Milk-Related Compounds  Other (See Comments)    Just Yogurt.  Makes him very lethargic.   Amiodarone  Anxiety    depression    Family History  Problem Relation Age of Onset   Heart disease Mother    Heart disease Father        before age 12   AAA (abdominal aortic aneurysm) Father    Melanoma Sister        with mets. died 98   Colon cancer Neg Hx    Social History:  reports that he quit smoking about 32 years ago. His smoking use included cigarettes. He started smoking about 52 years ago. He has a 5 pack-year smoking history. He has been exposed to tobacco smoke. He has never used smokeless tobacco. He reports that he does not currently use alcohol. He reports that he does not use drugs.  ROS: A complete review of systems was performed.  All systems are negative except for pertinent findings as noted. ROS   Physical Exam:  Vital signs in last 24 hours:   General: NAD Respiratory: normal WOB on RA Cards: RRR per monitor   Laboratory Data:  No results found for this or any previous visit (from the past 24 hours).  No results found for this or any previous visit (from the past 240 hours). Creatinine: No results for input(s): CREATININE in the last 168 hours.  Impression/Assessment:  89 year old male with suspected bladder mass on CT plan for TURBT today.  He is on Keflex  and has stopped his Coumadin  for the past several weeks.  We discussed risk benefits alternatives to procedure including bleeding infection damage from structures possible injury to the bladder area injury to the urethra and need for catheter postoperatively possible need to close bladder transabdominally.  Patient voiced understanding consent was obtained.  Plan:  Proceed with TURBT  Steffan JAYSON Pea 10/18/2024, 6:18 PM

## 2024-10-18 NOTE — Anesthesia Preprocedure Evaluation (Addendum)
 Anesthesia Evaluation  Patient identified by MRN, date of birth, ID band Patient awake    Reviewed: Allergy & Precautions, NPO status , Patient's Chart, lab work & pertinent test results  Airway Mallampati: II  TM Distance: >3 FB Neck ROM: Full    Dental no notable dental hx.    Pulmonary former smoker   Pulmonary exam normal        Cardiovascular hypertension, + CAD, + Past MI, + CABG, + Peripheral Vascular Disease and +CHF  + dysrhythmias Atrial Fibrillation + Valvular Problems/Murmurs (s/p AVR 2007) MR  Rhythm:Irregular Rate:Normal  ECHO 04/25:  1. Left ventricular ejection fraction, by estimation, is 35%. The left ventricle has moderately decreased function. The left ventricular internal cavity size was mildly dilated. Global hypokinesis with regional variation.  2. Peak RV-RA gradient 29 mmHg. Right ventricular systolic function is mildly reduced. The right ventricular size is normal.  3. Left atrial size was moderately dilated. No left atrial/left atrial appendage thrombus was detected.  4. Right atrial size was mildly dilated.  5. No ASD/PFO by color doppler.  6. Bioprosthetic aortic valve. Trivial perivalvular leakage. Mean gradient 6 mmHg, no significant stenosis.  7. The mitral valve is abnormal. The posterior leaflet is calcified and restricted. Severe mitral valve regurgitation via two moderate-appearing jets. PISA ERO is 0.21 cm^2 for jet 1 and 0.21 cm^2 for jet 2, additive ERO 0.42 cm^2. Vena contracta area for the 2 jets was 0.55 cm^2. No evidence of mitral stenosis. The mean mitral valve gradient is 3.0 mmHg. Moderate mitral annular calcification.  8. Normal caliber thoracic aorta with grade 3 plaque in the descending thoracic aorta.  9. 3D performed of the mitral valve.    Neuro/Psych negative neurological ROS  negative psych ROS   GI/Hepatic Neg liver ROS,GERD  ,,  Endo/Other  diabetes, Type 2     Renal/GU CRFRenal disease   Bladder mass    Musculoskeletal negative musculoskeletal ROS (+)    Abdominal Normal abdominal exam  (+)   Peds  Hematology Lab Results      Component                Value               Date                      WBC                      7.3                 10/01/2024                HGB                      14.4                10/01/2024                HCT                      43.2                10/01/2024                MCV                      91.9  10/01/2024                PLT                      177                 10/01/2024             Lab Results      Component                Value               Date                      NA                       138                 10/01/2024                K                        4.4                 10/01/2024                CO2                      27                  10/01/2024                GLUCOSE                  193 (H)             10/01/2024                BUN                      21                  10/01/2024                CREATININE               1.26 (H)            10/01/2024                CALCIUM                   9.8                 10/01/2024                GFR                      45.10 (L)           07/06/2024                EGFR                     51 (L)              11/01/2023                GFRNONAA  54 (L)              10/01/2024              Anesthesia Other Findings Coumadin  LD 10/28  Pt last seen by cardiology 07/25/24. Euvolemic on exam. Mitral regurgitation, has been evaluated by structural heart team, given severe mitral annular calcification anatomy not suitable for mTEER, medical management recommended. Pt stable without chest pain. Pt with permanent atrial fibrillation, on digoxin  and warfarin. Normal bioprosthetic valve on Echo 4/25. He is to follow up with cardio in November.    Pt unable to be contacted by cardiology for preoperative evaluation.  Pt unable to be reached by PAT nurse for preop visit.  PAT nurse spoke with pt's grandaughter who states pt last dose of Coumadin  was around Friday October 24th.    Discussed with Dr. Jevon Shells, recommends postponing case so pt can get cardiac clearance and have appropriate Coumdin hold.  Message sent to Dr. Shane, VM left with his scheduler.    Addendum 10/18/2024:  Per cardiology preoperative evaluation 10/10/2024, - RCRI score 2 points. 5% risk of major cardiac event.  - He is planning to undergo TURBT by Dr. Shane. His risk of perioperative cardiac complications using RCRI calculator is low (5%). No contraindication to proceed with surgery from cardiac standpoint, discussed with Dr. Rolan. Will forward note to Dr. Shane as he is going there after this.   Pt reports last dose of Coumadin  10/10/2024, no Lovenox  bridge needed per cardiology note.    Reproductive/Obstetrics                              Anesthesia Physical Anesthesia Plan  ASA: 4  Anesthesia Plan: General   Post-op Pain Management:    Induction: Intravenous  PONV Risk Score and Plan: 2 and Ondansetron , Dexamethasone  and Treatment may vary due to age or medical condition  Airway Management Planned: Mask and Oral ETT  Additional Equipment: ClearSight  Intra-op Plan:   Post-operative Plan: Extubation in OR  Informed Consent: I have reviewed the patients History and Physical, chart, labs and discussed the procedure including the risks, benefits and alternatives for the proposed anesthesia with the patient or authorized representative who has indicated his/her understanding and acceptance.     Dental advisory given  Plan Discussed with: CRNA  Anesthesia Plan Comments: (See PAT note 10/09/24)         Anesthesia Quick Evaluation

## 2024-10-19 ENCOUNTER — Ambulatory Visit (HOSPITAL_COMMUNITY)

## 2024-10-19 ENCOUNTER — Other Ambulatory Visit: Payer: Self-pay

## 2024-10-19 ENCOUNTER — Encounter (HOSPITAL_COMMUNITY): Payer: Self-pay | Admitting: Urology

## 2024-10-19 ENCOUNTER — Observation Stay (HOSPITAL_COMMUNITY)
Admission: RE | Admit: 2024-10-19 | Discharge: 2024-10-20 | Disposition: A | Discharge status: Home / Self Care | Attending: Urology | Admitting: Urology

## 2024-10-19 ENCOUNTER — Ambulatory Visit (HOSPITAL_COMMUNITY): Payer: Self-pay | Admitting: Anesthesiology

## 2024-10-19 ENCOUNTER — Ambulatory Visit (HOSPITAL_COMMUNITY): Payer: Self-pay | Admitting: Physician Assistant

## 2024-10-19 ENCOUNTER — Encounter (HOSPITAL_COMMUNITY)
Admission: RE | Disposition: A | Discharge status: Home / Self Care | Payer: Self-pay | Source: Home / Self Care | Attending: Urology

## 2024-10-19 DIAGNOSIS — I130 Hypertensive heart and chronic kidney disease with heart failure and stage 1 through stage 4 chronic kidney disease, or unspecified chronic kidney disease: Secondary | ICD-10-CM

## 2024-10-19 DIAGNOSIS — E1122 Type 2 diabetes mellitus with diabetic chronic kidney disease: Secondary | ICD-10-CM | POA: Diagnosis not present

## 2024-10-19 DIAGNOSIS — C679 Malignant neoplasm of bladder, unspecified: Secondary | ICD-10-CM | POA: Diagnosis present

## 2024-10-19 DIAGNOSIS — I509 Heart failure, unspecified: Secondary | ICD-10-CM | POA: Insufficient documentation

## 2024-10-19 DIAGNOSIS — N189 Chronic kidney disease, unspecified: Secondary | ICD-10-CM

## 2024-10-19 DIAGNOSIS — R310 Gross hematuria: Secondary | ICD-10-CM | POA: Diagnosis present

## 2024-10-19 DIAGNOSIS — Z87891 Personal history of nicotine dependence: Secondary | ICD-10-CM | POA: Diagnosis not present

## 2024-10-19 DIAGNOSIS — D494 Neoplasm of unspecified behavior of bladder: Secondary | ICD-10-CM

## 2024-10-19 DIAGNOSIS — D6869 Other thrombophilia: Principal | ICD-10-CM

## 2024-10-19 DIAGNOSIS — C676 Malignant neoplasm of ureteric orifice: Secondary | ICD-10-CM | POA: Diagnosis not present

## 2024-10-19 DIAGNOSIS — I2510 Atherosclerotic heart disease of native coronary artery without angina pectoris: Secondary | ICD-10-CM | POA: Diagnosis not present

## 2024-10-19 DIAGNOSIS — E118 Type 2 diabetes mellitus with unspecified complications: Secondary | ICD-10-CM

## 2024-10-19 DIAGNOSIS — Z79899 Other long term (current) drug therapy: Secondary | ICD-10-CM | POA: Insufficient documentation

## 2024-10-19 DIAGNOSIS — D4959 Neoplasm of unspecified behavior of other genitourinary organ: Secondary | ICD-10-CM | POA: Diagnosis not present

## 2024-10-19 DIAGNOSIS — Z7901 Long term (current) use of anticoagulants: Secondary | ICD-10-CM

## 2024-10-19 HISTORY — DX: Presence of other specified devices: Z97.8

## 2024-10-19 HISTORY — PX: TRANSURETHRAL RESECTION OF BLADDER TUMOR: SHX2575

## 2024-10-19 LAB — GLUCOSE, CAPILLARY
Glucose-Capillary: 116 mg/dL — ABNORMAL HIGH (ref 70–99)
Glucose-Capillary: 111 mg/dL — ABNORMAL HIGH (ref 70–99)
Glucose-Capillary: 187 mg/dL — ABNORMAL HIGH (ref 70–99)

## 2024-10-19 LAB — HEMOGLOBIN A1C
Hgb A1c MFr Bld: 6.3 % — ABNORMAL HIGH (ref 4.8–5.6)
Mean Plasma Glucose: 134.11 mg/dL

## 2024-10-19 LAB — PROTIME-INR
INR: 1.2 (ref 0.8–1.2)
Prothrombin Time: 15.4 s — ABNORMAL HIGH (ref 11.4–15.2)

## 2024-10-19 LAB — APTT: aPTT: 34 s (ref 24–36)

## 2024-10-19 MED ORDER — GLUCOSAMINE CHONDROITIN TRIPLE PO TABS: 1.0000 | ORAL_TABLET | Freq: Every day | ORAL | Status: DC

## 2024-10-19 MED ORDER — TORSEMIDE 20 MG PO TABS
60.0000 mg | ORAL_TABLET | Freq: Every day | ORAL | Status: DC
  Administered 2024-10-20: 60 mg via ORAL
  Filled 2024-10-19: qty 3

## 2024-10-19 MED ORDER — FENTANYL CITRATE (PF) 50 MCG/ML IJ SOSY
PREFILLED_SYRINGE | INTRAMUSCULAR | Status: AC
  Filled 2024-10-19: qty 1

## 2024-10-19 MED ORDER — PHENYLEPHRINE 80 MCG/ML (10ML) SYRINGE FOR IV PUSH (FOR BLOOD PRESSURE SUPPORT)
PREFILLED_SYRINGE | INTRAVENOUS | Status: AC
  Filled 2024-10-19: qty 10

## 2024-10-19 MED ORDER — FUROSEMIDE 10 MG/ML IJ SOLN
INTRAMUSCULAR | Status: AC
  Filled 2024-10-19: qty 4

## 2024-10-19 MED ORDER — INSULIN ASPART 100 UNIT/ML IJ SOLN
2.0000 [IU] | Freq: Three times a day (TID) | INTRAMUSCULAR | Status: DC
  Administered 2024-10-20: 2 [IU] via SUBCUTANEOUS
  Filled 2024-10-19: qty 2

## 2024-10-19 MED ORDER — INSULIN ASPART 100 UNIT/ML IJ SOLN: 0.0000 [IU] | INTRAMUSCULAR | Status: DC | PRN

## 2024-10-19 MED ORDER — POTASSIUM CHLORIDE CRYS ER 20 MEQ PO TBCR
20.0000 meq | EXTENDED_RELEASE_TABLET | Freq: Two times a day (BID) | ORAL | Status: DC
  Administered 2024-10-19: 20 meq via ORAL
  Filled 2024-10-19 (×2): qty 1

## 2024-10-19 MED ORDER — SPIRONOLACTONE 25 MG PO TABS: 25.0000 mg | ORAL_TABLET | Freq: Every day | ORAL | Status: DC

## 2024-10-19 MED ORDER — DORZOLAMIDE HCL-TIMOLOL MAL 2-0.5 % OP SOLN
1.0000 [drp] | Freq: Two times a day (BID) | OPHTHALMIC | Status: DC
  Administered 2024-10-19 – 2024-10-20 (×2): 1 [drp] via OPHTHALMIC
  Filled 2024-10-19: qty 10

## 2024-10-19 MED ORDER — IOHEXOL 300 MG/ML  SOLN
INTRAMUSCULAR | Status: DC | PRN
  Administered 2024-10-19: 100 mL

## 2024-10-19 MED ORDER — PHENYLEPHRINE HCL (PRESSORS) 10 MG/ML IV SOLN
INTRAVENOUS | Status: DC | PRN
  Administered 2024-10-19: 160 ug via INTRAVENOUS

## 2024-10-19 MED ORDER — ACETAMINOPHEN 10 MG/ML IV SOLN
INTRAVENOUS | Status: AC
  Filled 2024-10-19: qty 100

## 2024-10-19 MED ORDER — LACTATED RINGERS IV SOLN: INTRAVENOUS | Status: DC

## 2024-10-19 MED ORDER — INSULIN ASPART 100 UNIT/ML IJ SOLN
0.0000 [IU] | Freq: Three times a day (TID) | INTRAMUSCULAR | Status: DC
  Administered 2024-10-20: 2 [IU] via SUBCUTANEOUS
  Filled 2024-10-19: qty 2

## 2024-10-19 MED ORDER — FENTANYL CITRATE (PF) 250 MCG/5ML IJ SOLN
INTRAMUSCULAR | Status: DC | PRN
  Administered 2024-10-19: 50 ug via INTRAVENOUS

## 2024-10-19 MED ORDER — ONDANSETRON HCL 4 MG/2ML IJ SOLN
INTRAMUSCULAR | Status: DC | PRN
  Administered 2024-10-19: 4 mg via INTRAVENOUS

## 2024-10-19 MED ORDER — CO Q10 200 MG PO CAPS: 200.0000 mg | ORAL_CAPSULE | Freq: Every day | ORAL | Status: DC

## 2024-10-19 MED ORDER — POLYETHYLENE GLYCOL 3350 17 G PO PACK: 17.0000 g | PACK | Freq: Every day | ORAL | Status: DC | PRN

## 2024-10-19 MED ORDER — EZETIMIBE 10 MG PO TABS: 10.0000 mg | ORAL_TABLET | Freq: Every day | ORAL | Status: DC

## 2024-10-19 MED ORDER — METHOCARBAMOL 750 MG PO TABS: 750.0000 mg | ORAL_TABLET | Freq: Four times a day (QID) | ORAL | 0 refills | Status: AC

## 2024-10-19 MED ORDER — ONDANSETRON HCL 4 MG/2ML IJ SOLN
INTRAMUSCULAR | Status: AC
  Filled 2024-10-19: qty 2

## 2024-10-19 MED ORDER — TORSEMIDE 20 MG PO TABS
40.0000 mg | ORAL_TABLET | Freq: Every evening | ORAL | Status: DC
  Administered 2024-10-19: 40 mg via ORAL
  Filled 2024-10-19: qty 2

## 2024-10-19 MED ORDER — TAMSULOSIN HCL 0.4 MG PO CAPS: 0.4000 mg | ORAL_CAPSULE | Freq: Two times a day (BID) | ORAL | Status: DC

## 2024-10-19 MED ORDER — ATORVASTATIN CALCIUM 10 MG PO TABS: 10.0000 mg | ORAL_TABLET | ORAL | Status: DC

## 2024-10-19 MED ORDER — FUROSEMIDE 10 MG/ML IJ SOLN
40.0000 mg | Freq: Once | INTRAMUSCULAR | Status: AC
  Administered 2024-10-19: 40 mg via INTRAVENOUS

## 2024-10-19 MED ORDER — SUGAMMADEX SODIUM 200 MG/2ML IV SOLN
INTRAVENOUS | Status: DC | PRN
  Administered 2024-10-19: 50 mg via INTRAVENOUS
  Administered 2024-10-19: 150 mg via INTRAVENOUS

## 2024-10-19 MED ORDER — DEXAMETHASONE SOD PHOSPHATE PF 10 MG/ML IJ SOLN
INTRAMUSCULAR | Status: DC | PRN
  Administered 2024-10-19: 10 mg via INTRAVENOUS

## 2024-10-19 MED ORDER — ONDANSETRON HCL 4 MG/2ML IJ SOLN: 4.0000 mg | INTRAMUSCULAR | Status: DC | PRN

## 2024-10-19 MED ORDER — DOCUSATE SODIUM 100 MG PO CAPS
100.0000 mg | ORAL_CAPSULE | Freq: Two times a day (BID) | ORAL | Status: DC
  Administered 2024-10-19 – 2024-10-20 (×2): 100 mg via ORAL
  Filled 2024-10-19 (×2): qty 1

## 2024-10-19 MED ORDER — OXYCODONE HCL 5 MG PO TABS
5.0000 mg | ORAL_TABLET | ORAL | Status: DC | PRN
  Administered 2024-10-20: 5 mg via ORAL
  Filled 2024-10-19: qty 1

## 2024-10-19 MED ORDER — POLYETHYLENE GLYCOL 3350 17 G PO PACK: 17.0000 g | PACK | Freq: Every day | ORAL | 0 refills | Status: DC

## 2024-10-19 MED ORDER — LIDOCAINE HCL (PF) 2 % IJ SOLN
INTRAMUSCULAR | Status: AC
  Filled 2024-10-19: qty 5

## 2024-10-19 MED ORDER — CHLORHEXIDINE GLUCONATE 0.12 % MT SOLN: 15.0000 mL | Freq: Once | OROMUCOSAL | Status: AC

## 2024-10-19 MED ORDER — ACETAMINOPHEN 10 MG/ML IV SOLN
1000.0000 mg | Freq: Once | INTRAVENOUS | Status: DC | PRN
  Administered 2024-10-19: 1000 mg via INTRAVENOUS

## 2024-10-19 MED ORDER — CEFAZOLIN SODIUM-DEXTROSE 2-4 GM/100ML-% IV SOLN
2.0000 g | INTRAVENOUS | Status: AC
  Administered 2024-10-19: 2 g via INTRAVENOUS
  Filled 2024-10-19: qty 100

## 2024-10-19 MED ORDER — LIDOCAINE 2% (20 MG/ML) 5 ML SYRINGE
INTRAMUSCULAR | Status: DC | PRN
  Administered 2024-10-19: 60 mg via INTRAVENOUS
  Administered 2024-10-19: 20 mg via INTRAVENOUS

## 2024-10-19 MED ORDER — SUGAMMADEX SODIUM 200 MG/2ML IV SOLN
INTRAVENOUS | Status: AC
Start: 2024-10-19 — End: 2024-10-19
  Filled 2024-10-19: qty 2

## 2024-10-19 MED ORDER — ACETAMINOPHEN 325 MG PO TABS
650.0000 mg | ORAL_TABLET | Freq: Four times a day (QID) | ORAL | Status: DC
  Administered 2024-10-19 – 2024-10-20 (×3): 650 mg via ORAL
  Filled 2024-10-19 (×3): qty 2

## 2024-10-19 MED ORDER — PROPOFOL 10 MG/ML IV BOLUS
INTRAVENOUS | Status: DC | PRN
  Administered 2024-10-19 (×2): 20 mg via INTRAVENOUS
  Administered 2024-10-19: 70 mg via INTRAVENOUS

## 2024-10-19 MED ORDER — ROCURONIUM BROMIDE 10 MG/ML (PF) SYRINGE
PREFILLED_SYRINGE | INTRAVENOUS | Status: AC
  Filled 2024-10-19: qty 10

## 2024-10-19 MED ORDER — LATANOPROST 0.005 % OP SOLN
1.0000 [drp] | Freq: Every day | OPHTHALMIC | Status: DC
  Administered 2024-10-19: 1 [drp] via OPHTHALMIC
  Filled 2024-10-19: qty 2.5

## 2024-10-19 MED ORDER — ATORVASTATIN CALCIUM 20 MG PO TABS: 20.0000 mg | ORAL_TABLET | Freq: Every day | ORAL | Status: DC

## 2024-10-19 MED ORDER — ORAL CARE MOUTH RINSE
15.0000 mL | Freq: Once | OROMUCOSAL | Status: AC
  Administered 2024-10-19: 15 mL via OROMUCOSAL

## 2024-10-19 MED ORDER — FENTANYL CITRATE (PF) 50 MCG/ML IJ SOSY
25.0000 ug | PREFILLED_SYRINGE | INTRAMUSCULAR | Status: DC | PRN
  Administered 2024-10-19 (×2): 25 ug via INTRAVENOUS

## 2024-10-19 MED ORDER — FENTANYL CITRATE (PF) 250 MCG/5ML IJ SOLN
INTRAMUSCULAR | Status: AC
  Filled 2024-10-19: qty 5

## 2024-10-19 MED ORDER — ROCURONIUM BROMIDE 10 MG/ML (PF) SYRINGE
PREFILLED_SYRINGE | INTRAVENOUS | Status: DC | PRN
  Administered 2024-10-19: 50 mg via INTRAVENOUS

## 2024-10-19 MED ORDER — PHENYLEPHRINE 80 MCG/ML (10ML) SYRINGE FOR IV PUSH (FOR BLOOD PRESSURE SUPPORT)
PREFILLED_SYRINGE | INTRAVENOUS | Status: DC | PRN
  Administered 2024-10-19 (×4): 80 ug via INTRAVENOUS
  Administered 2024-10-19: 40 ug via INTRAVENOUS
  Administered 2024-10-19: 80 ug via INTRAVENOUS

## 2024-10-19 MED ORDER — SODIUM CHLORIDE 0.9 % IR SOLN
Status: DC | PRN
  Administered 2024-10-19: 6000 mL

## 2024-10-19 MED ORDER — PROPOFOL 10 MG/ML IV BOLUS
INTRAVENOUS | Status: AC
  Filled 2024-10-19: qty 20

## 2024-10-19 SURGICAL SUPPLY — 22 items
BAG URINE DRAIN 2000ML AR STRL (UROLOGICAL SUPPLIES) IMPLANT
BAG URO CATCHER STRL LF (MISCELLANEOUS) ×1 IMPLANT
CATH FOLEY 2WAY SLVR 5CC 20FR (CATHETERS) IMPLANT
CATH FOLEY 2WAY SLVR 5CC 22FR (CATHETERS) IMPLANT
CATH URETL OPEN END 6FR 70 (CATHETERS) IMPLANT
DRAPE FOOT SWITCH (DRAPES) ×1 IMPLANT
DRSG TEGADERM 2-3/8X2-3/4 SM (GAUZE/BANDAGES/DRESSINGS) IMPLANT
ELECT REM PT RETURN 15FT ADLT (MISCELLANEOUS) ×1 IMPLANT
GLOVE BIO SURGEON STRL SZ8 (GLOVE) ×1 IMPLANT
GOWN STRL REUS W/ TWL XL LVL3 (GOWN DISPOSABLE) ×1 IMPLANT
GUIDEWIRE STR DUAL SENSOR (WIRE) IMPLANT
KIT TURNOVER KIT A (KITS) ×1 IMPLANT
LOOP CUT BIPOLAR 24F LRG (ELECTROSURGICAL) IMPLANT
MANIFOLD NEPTUNE II (INSTRUMENTS) ×1 IMPLANT
NDL SAFETY ECLIPSE 18X1.5 (NEEDLE) ×1 IMPLANT
PACK CYSTO (CUSTOM PROCEDURE TRAY) ×1 IMPLANT
SOLN STERILE WATER 500 ML (IV SOLUTION) IMPLANT
STENT URET 6FRX26 CONTOUR (STENTS) IMPLANT
SYR 10ML LL (SYRINGE) IMPLANT
SYRINGE TOOMEY IRRIG 70ML (MISCELLANEOUS) IMPLANT
TUBING CONNECTING 10 (TUBING) ×1 IMPLANT
TUBING UROLOGY SET (TUBING) ×1 IMPLANT

## 2024-10-19 NOTE — Consult Note (Signed)
 [facility] Initial Consultation Note  Nathaniel [name]. FMW:[id number] DOB: September 11, 1932 DOA: 10/19/2024  PCP: Katrinka Garnette KIDD, MD   Requesting Physician: Dr. Shane [facility]   Reason for Consultation: Medical Management  HPI: Nathaniel Port. is a 89 y.o. male with medical history significant for chronic atrial fibrillation, CAD status post CABG and redo CABG, ischemic cardiomyopathy with heart failure with reduced EF as well as hypertension and hyperlipidemia who was admitted to the hospital by the urology service after TURBT and is being seen in consultation at the request of his primary urological service for medical management of his comorbidities.  Patient was seen and examined in the PACU postoperatively, surgery went well but was described by his surgeon as bloody.  Patient states that he ended up holding his Coumadin  for approximately 10 days prior to this procedure, since surgery had to be rescheduled once.  He denies any recent chest pain, orthopnea, states that he has some mild lower extremity edema but this is no better or worse than what he considers his usual.  Currently, his only complaint is of discomfort due to his urinary catheter placed operatively, but otherwise has no complaints.  Review of Systems: Please see HPI for pertinent positives and negatives. A complete 10 system review of systems are otherwise negative.  Past Medical History:  Diagnosis Date   AAA (abdominal aortic aneurysm)    Atrial fibrillation (HCC)    CAD (coronary artery disease)    Cataract    CHF (congestive heart failure) (HCC)    Chronic kidney disease    Colon polyps    adenomatous   Diabetes mellitus    Diverticulosis    Dysrhythmia    A-Fib   Foley catheter in place    GERD (gastroesophageal reflux disease)    Gout    Hernia, abdominal    History of aortic valve replacement with bioprosthetic valve    2007   Hx: UTI (urinary tract infection)    Hyperlipemia    Hypertension     Internal hemorrhoids    Myocardial infarction (HCC)    Peripheral vascular disease    Scarlet fever    Shortness of breath    with exertion   Past Surgical History:  Procedure Laterality Date   ABDOMINAL AORTIC ENDOVASCULAR STENT GRAFT N/A 04/13/2014   Procedure: ABDOMINAL AORTIC ENDOVASCULAR STENT GRAFT;  Surgeon: Gaile LELON New, MD;  Location: [facility] OR;  Service: Vascular;  Laterality: N/A;   ANGIOPLASTY     Unsure if he had stents put in    APPENDECTOMY     CARDIAC VALVE SURGERY     CARDIOVERSION N/A 06/06/2014   Procedure: CARDIOVERSION;  Surgeon: Ezra GORMAN Shuck, MD;  Location: [facility] ENDOSCOPY;  Service: Cardiovascular;  Laterality: N/A;   CARDIOVERSION N/A 08/21/2014   Procedure: CARDIOVERSION;  Surgeon: Ezra GORMAN Shuck, MD;  Location: [facility] ENDOSCOPY;  Service: Cardiovascular;  Laterality: N/A;   CARDIOVERSION N/A 12/22/2017   Procedure: CARDIOVERSION;  Surgeon: Shuck Ezra GORMAN, MD;  Location: [facility] ENDOSCOPY;  Service: Cardiovascular;  Laterality: N/A;   CARDIOVERSION N/A 05/25/2019   Procedure: CARDIOVERSION;  Surgeon: Shuck Ezra GORMAN, MD;  Location: [facility] ENDOSCOPY;  Service: Cardiovascular;  Laterality: N/A;   CARDIOVERSION N/A 06/14/2019   Procedure: CARDIOVERSION;  Surgeon: Jeffrie Oneil BROCKS, MD;  Location: [facility] ENDOSCOPY;  Service: Cardiovascular;  Laterality: N/A;   CARDIOVERSION N/A 12/12/2020   Procedure: CARDIOVERSION;  Surgeon: Shuck Ezra GORMAN, MD;  Location: [facility] ENDOSCOPY;  Service: Cardiovascular;  Laterality: N/A;  CARDIOVERSION N/A 01/29/2021   Procedure: CARDIOVERSION;  Surgeon: Rolan Ezra RAMAN, MD;  Location: [facility] ENDOSCOPY;  Service: Cardiovascular;  Laterality: N/A;   CARDIOVERSION N/A 03/19/2022   Procedure: CARDIOVERSION;  Surgeon: Rolan Ezra RAMAN, MD;  Location: [facility] ENDOSCOPY;  Service: Cardiovascular;  Laterality: N/A;   CARDIOVERSION N/A 08/24/2022   Procedure: CARDIOVERSION;  Surgeon: Rolan Ezra RAMAN, MD;  Location: [facility] ENDOSCOPY;  Service: Cardiovascular;  Laterality: N/A;    CARDIOVERSION N/A 09/23/2022   Procedure: CARDIOVERSION;  Surgeon: Rolan Ezra RAMAN, MD;  Location: [facility] ENDOSCOPY;  Service: Cardiovascular;  Laterality: N/A;   CORONARY ARTERY BYPASS GRAFT     ENDARTERECTOMY Right 02/02/2018   Procedure: ENDARTERECTOMY CAROTID RIGHT;  Surgeon: Serene Gaile ORN, MD;  Location: [facility] OR;  Service: Vascular;  Laterality: Right;   EYE SURGERY Bilateral    cataract surgery   HERNIA REPAIR     2 operations   LEFT HEART CATHETERIZATION WITH CORONARY/GRAFT ANGIOGRAM N/A 07/10/2014   Procedure: LEFT HEART CATHETERIZATION WITH EL BILE;  Surgeon: Ezra RAMAN Rolan, MD;  Location: [facility] CATH LAB;  Service: Cardiovascular;  Laterality: N/A;   RIGHT HEART CATH N/A 03/21/2024   Procedure: RIGHT HEART CATH;  Surgeon: Rolan Ezra RAMAN, MD;  Location: [facility] INVASIVE CV LAB;  Service: Cardiovascular;  Laterality: N/A;   RIGHT HEART CATH AND CORONARY/GRAFT ANGIOGRAPHY N/A 08/21/2022   Procedure: RIGHT HEART CATH AND CORONARY/GRAFT ANGIOGRAPHY;  Surgeon: Rolan Ezra RAMAN, MD;  Location: [facility] INVASIVE CV LAB;  Service: Cardiovascular;  Laterality: N/A;   TEE WITHOUT CARDIOVERSION N/A 05/25/2019   Procedure: TRANSESOPHAGEAL ECHOCARDIOGRAM (TEE);  Surgeon: Rolan Ezra RAMAN, MD;  Location: [facility] ENDOSCOPY;  Service: Cardiovascular;  Laterality: N/A;   TEE WITHOUT CARDIOVERSION N/A 01/29/2021   Procedure: TRANSESOPHAGEAL ECHOCARDIOGRAM (TEE);  Surgeon: Rolan Ezra RAMAN, MD;  Location: [facility] ENDOSCOPY;  Service: Cardiovascular;  Laterality: N/A;   TEE WITHOUT CARDIOVERSION N/A 03/19/2022   Procedure: TRANSESOPHAGEAL ECHOCARDIOGRAM (TEE);  Surgeon: Rolan Ezra RAMAN, MD;  Location: [facility] ENDOSCOPY;  Service: Cardiovascular;  Laterality: N/A;   TEE WITHOUT CARDIOVERSION N/A 08/24/2022   Procedure: TRANSESOPHAGEAL ECHOCARDIOGRAM (TEE);  Surgeon: Rolan Ezra RAMAN, MD;  Location: [facility] ENDOSCOPY;  Service: Cardiovascular;  Laterality: N/A;   TEE WITHOUT CARDIOVERSION N/A 09/23/2022   Procedure: TRANSESOPHAGEAL  ECHOCARDIOGRAM (TEE);  Surgeon: Rolan Ezra RAMAN, MD;  Location: [facility] ENDOSCOPY;  Service: Cardiovascular;  Laterality: N/A;   TONSILLECTOMY     TRANSESOPHAGEAL ECHOCARDIOGRAM (CATH LAB) N/A 03/22/2024   Procedure: TRANSESOPHAGEAL ECHOCARDIOGRAM;  Surgeon: Rolan Ezra RAMAN, MD;  Location: [facility] INVASIVE CV LAB;  Service: Cardiovascular;  Laterality: N/A;   VIDEO BRONCHOSCOPY Bilateral 12/25/2015   Procedure: VIDEO BRONCHOSCOPY WITHOUT FLUORO;  Surgeon: Tonnie FORBES Flicker, MD;  Location: [facility] ENDOSCOPY;  Service: Cardiopulmonary;  Laterality: Bilateral;    Social History:  reports that he quit smoking about 32 years ago. His smoking use included cigarettes. He started smoking about 52 years ago. He has a 5 pack-year smoking history. He has been exposed to tobacco smoke. He has never used smokeless tobacco. He reports that he does not currently use alcohol. He reports that he does not use drugs.  Allergies  Allergen Reactions   Barbiturates     Queasy and tired   Grass Extracts [Gramineae Pollens] Other (See Comments)    Sneezing and runny nose   Milk-Related Compounds Other (See Comments)    Just Yogurt.  Makes him very lethargic.   Amiodarone  Anxiety    depression    Family History  Problem Relation Age of Onset  Heart disease Mother    Heart disease Father        before age 78   AAA (abdominal aortic aneurysm) Father    Melanoma Sister        with mets. died 60   Colon cancer Neg Hx      Prior to Admission medications   Medication Sig Start Date End Date Taking? Authorizing Provider  atorvastatin  (LIPITOR) 10 MG tablet Take 10 mg by mouth every other day. 06/07/24  Yes [provider]  Coenzyme Q10 (CO Q10) 200 MG CAPS Take 200 mg by mouth daily.   Yes [provider]  ezetimibe  (ZETIA ) 10 MG tablet TAKE ONE TABLET BY MOUTH AT BEDTIME 02/07/24  Yes Katrinka Garnette KIDD, MD  METAMUCIL FIBER PO Take 1 Dose by mouth as needed (constipation).   Yes [provider]   methocarbamol (ROBAXIN) 750 MG tablet Take 1 tablet (750 mg total) by mouth 4 (four) times daily for 20 doses. 10/19/24 10/24/24 Yes Showalter, Steffan BROCKS, MD  polyethylene glycol (MIRALAX ) 17 g packet Take 17 g by mouth daily. 10/19/24  Yes Shane Steffan BROCKS, MD  potassium chloride  SA (KLOR-CON  M) 20 MEQ tablet TAKE ONE TABLET BY MOUTH TWICE DAILY 06/07/24  Yes Rolan Ezra RAMAN, MD  spironolactone  (ALDACTONE ) 25 MG tablet TAKE ONE TABLET BY MOUTH EVERY DAY 08/13/23  Yes Rolan Ezra RAMAN, MD  tamsulosin  (FLOMAX ) 0.4 MG CAPS capsule TAKE ONE CAPSULE BY MOUTH TWICE DAILY 03/25/23  Yes Katrinka Garnette KIDD, MD  torsemide  (DEMADEX ) 20 MG tablet Take 3 tablets (60 mg total) by mouth every morning AND 2 tablets (40 mg total) every evening. 09/15/24  Yes Rolan Ezra RAMAN, MD  atorvastatin  (LIPITOR) 20 MG tablet Take 1 tablet by mouth Daily. 06/12/24   Rolan Ezra RAMAN, MD  dorzolamide -timolol  (COSOPT ) 22.3-6.8 MG/ML ophthalmic solution Place 1 drop into both eyes 2 (two) times daily.    [provider]  latanoprost  (XALATAN ) 0.005 % ophthalmic solution Place 1 drop into both eyes at bedtime.    [provider]  Misc Natural Products (GLUCOSAMINE CHONDROITIN TRIPLE) TABS Take 1 tablet by mouth daily.    [provider]  REPATHA  SURECLICK 140 MG/ML SOAJ INJECT 1 PEN INTO SKIN EVERY 14 DAYS 02/08/24   Rolan Ezra RAMAN, MD  warfarin (COUMADIN ) 3 MG tablet TAKE TWO TO THREE TABLETS DAILY OR AS DIRECTED BY COUMADIN  CLINIC 08/28/24   Rolan Ezra RAMAN, MD    Physical Exam: BP (!) 136/97   Pulse 94   Temp (!) 97 F (36.1 C)   Resp 15   Ht 5' 10 (1.778 m)   Wt 73.5 kg   SpO2 99%   BMI 23.24 kg/m  General:  Alert, oriented, calm, in no acute distress, wearing nasal cannula oxygen.  Elderly gentleman appearing his stated age. Cardiovascular: Tachycardic and irregular, no murmurs or rubs, he has 1+ pitting edema up to the mid ankle Respiratory: clear to auscultation bilaterally, no wheezes,  no crackles, but breath sounds are diminished at the bilateral bases Abdomen: soft, nontender, nondistended, normal bowel tones heard  Skin: dry, no rashes  Musculoskeletal: no joint effusions, normal range of motion  Psychiatric: appropriate affect, normal speech  Neurologic: extraocular muscles intact, clear speech, moving all extremities with intact sensorium         Recent Labs and Imaging Reviewed:  Basic Metabolic Panel: No results for input(s): NA, K, CL, CO2, GLUCOSE, BUN, CREATININE, CALCIUM , MG, PHOS in the last 168 hours. Liver  Function Tests: No results for input(s): AST, ALT, ALKPHOS, BILITOT, PROT, ALBUMIN in the last 168 hours. No results for input(s): LIPASE, AMYLASE in the last 168 hours. No results for input(s): AMMONIA in the last 168 hours. CBC: No results for input(s): WBC, NEUTROABS, HGB, HCT, MCV, PLT in the last 168 hours. Cardiac Enzymes: No results for input(s): CKTOTAL, CKMB, CKMBINDEX, TROPONINI in the last 168 hours.  BNP (last 3 results) Recent Labs    04/04/24 1642 05/01/24 1430 07/25/24 1214  BNP 719.3* 397.6* 344.0*    ProBNP (last 3 results) Recent Labs    11/01/23 1241  PROBNP 5,110*    CBG: Recent Labs  Lab 10/19/24 0643 10/19/24 1009  GLUCAP 116* 111*    Radiological Exams on Admission: DG C-Arm 1-60 Min-No Report Result Date: 10/19/2024 Fluoroscopy was utilized by the requesting physician.  No radiographic interpretation.    Summary and Recommendations: Nathaniel Morini. is a 89 y.o. male with medical history significant for chronic atrial fibrillation, CAD status post CABG and redo CABG, ischemic cardiomyopathy with heart failure with reduced EF as well as hypertension and hyperlipidemia who was admitted to the hospital by the urology service after TURBT and is being seen in consultation at the request of his primary urological service for medical management of his  comorbidities.  Heart failure with reduced EF, ischemic cardiomyopathy-TEE performed/9/25 for mitral regurgitation, demonstrated EF of approximately 35%.  He has bioprosthetic aortic valve.  Overall the patient appears euvolemic to me, is saturating well and has only mild lower extremity edema. -Monitor on telemetry -Sodium restricted diet -Given mild volume overload and intraoperative fluids received, will give one-time dose of IV Lasix  40 mg now -Agree with resuming torsemide  at home dose along with potassium supplementation  Chronic atrial fibrillation-asymptomatic, monitor on telemetry.  Patient is no longer on rate controlling medications as it made him feel unwell and he is no longer interested in trying other medications.  He is status post watchman's. -Monitor on telemetry for symptomatic or rapid A-fib -Resume anticoagulation when okay with primary team  Hyperlipidemia-Lipitor  Hypertension-Aldactone   Type 2 diabetes-diet controlled, last A1c 6.8. -Carb modified diet -Sliding scale  [facility] will continue to follow along with you.  Time spent: 59 minutes  Nathaniel Dory CHRISTELLA Gail MD [facility] Pager [phone]  If 7PM-7AM, please contact night-coverage [email or web address] Password [email or web address]  10/19/2024, 2:27 PM

## 2024-10-19 NOTE — Discharge Instructions (Addendum)
 Transurethral Resection of Bladder Tumor (TURBT) or Bladder Biopsy   Definition:  Transurethral Resection of the Bladder Tumor is a surgical procedure used to diagnose and remove tumors within the bladder. TURBT is the most common treatment for early stage bladder cancer.  General instructions:     Your recent bladder surgery requires very little post hospital care but some definite precautions.  Despite the fact that no skin incisions were used, the area around the bladder incisions are raw and covered with scabs to promote healing and prevent bleeding. Certain precautions are needed to insure that the scabs are not disturbed over the next 2-4 weeks while the healing proceeds.  Because the raw surface inside your bladder and the irritating effects of urine you may expect frequency of urination and/or urgency (a stronger desire to urinate) and perhaps even getting up at night more often. This will usually resolve or improve slowly over the healing period. You may see some blood in your urine over the first 6 weeks. Do not be alarmed, even if the urine was clear for a while. Get off your feet and drink lots of fluids until clearing occurs. If you start to pass clots or don't improve call us .  Catheter: Due to the nature of this surgery some patients need to be discharged with a catheter. If you are discharged with a catheter instructions on how to care for the catheter will be attached to your discharge paperwork. You will be called to schedule an appointment to remove the catheter.   Diet:  You may return to your normal diet immediately. Because of the raw surface of your bladder, alcohol, spicy foods, foods high in acid and drinks with caffeine may cause irritation or frequency and should be used in moderation. To keep your urine flowing freely and avoid constipation, drink plenty of fluids during the day (8-10 glasses). Tip: Avoid cranberry juice because it is very acidic.  Activity:  Do not  lift more than 10 LBS for 2 weeks Do not strain with bowel movents.  Your physical activity doesn't need to be restricted. However, if you are very active, you may see some blood in the urine. We suggest that you reduce your activity under the circumstances until the bleeding has stopped.  Bowels:  It is important to keep your bowels regular during the postoperative period. Straining with bowel movements can cause bleeding. A bowel movement every other day is reasonable. Use a mild laxative if needed, such as milk of magnesia 2-3 tablespoons, or 2 Dulcolax tablets. Call if you continue to have problems. If you had been taking narcotics for pain, before, during or after your surgery, you may be constipated. Take a laxative if necessary.    Medication:  You should resume your pre-surgery medications unless told not to. In addition you may be given an antibiotic to prevent or treat infection. Antibiotics are not always necessary. All medication should be taken as prescribed until the bottles are finished unless you are having an unusual reaction to one of the drugs.  Continue to cold warfarin until catheter is removed. Then restart 1 days after catheter is removed.   Foley Catheter Care A soft, flexible tube (Foley catheter) may have been placed in your bladder to drain urine and fluid. Follow these instructions: Taking Care of the Catheter Keep the area where the catheter leaves your body clean.  Attach the catheter to the leg so there is no tension on the catheter.  Keep the drainage bag  below the level of the bladder, but keep it OFF the floor.  Do not take long soaking baths. Your caregiver will give instructions about showering.  Wash your hands before touching ANYTHING related to the catheter or bag.  Using mild soap and warm water  on a washcloth:  Clean the area closest to the catheter insertion site using a circular motion around the catheter.  Clean the catheter itself by wiping AWAY  from the insertion site for several inches down the tube.  NEVER wipe upward as this could sweep bacteria up into the urethra (tube in your body that normally drains the bladder) and cause infection.  Place a small amount of sterile lubricant at the tip of the penis where the catheter is entering.  Taking Care of the Drainage Bags Two drainage bags may be taken home: a large overnight drainage bag, and a smaller leg bag which fits underneath clothing.  It is okay to wear the overnight bag at any time, but NEVER wear the smaller leg bag at night.  Keep the drainage bag well below the level of your bladder. This prevents backflow of urine into the bladder and allows the urine to drain freely.  Anchor the tubing to your leg to prevent pulling or tension on the catheter. Use tape or a leg strap provided by the hospital.  Empty the drainage bag when it is 1/2 to 3/4 full. Wash your hands before and after touching the bag.  Periodically check the tubing for kinks to make sure there is no pressure on the tubing which could restrict the flow of urine.  Changing the Drainage Bags Cleanse both ends of the clean bag with alcohol before changing.  Pinch off the rubber catheter to avoid urine spillage during the disconnection.  Disconnect the dirty bag and connect the clean one.  Empty the dirty bag carefully to avoid a urine spill.  Attach the new bag to the leg with tape or a leg strap.  Cleaning the Drainage Bags Whenever a drainage bag is disconnected, it must be cleaned quickly so it is ready for the next use.  Wash the bag in warm, soapy water .  Rinse the bag thoroughly with warm water .  Soak the bag for 30 minutes in a solution of white vinegar and water  (1 cup vinegar to 1 quart warm water ).  Rinse with warm water .   IT Normal To See Some blood in the urine is normal, as long as you can see your fingers through the catheter tubing (not the bag) this is ok. As long as the urine is not the  consistency of tomato paste.  It will be normal to feel the urge to urinate often this is a side effect of the catheter  Some discharge around the catheter where it exits the penis is normal  SEEK MEDICAL CARE IF:  You have chills or night sweats.  You are leaking around your catheter or have problems with your catheter. It is not uncommon to have sporadic leakage around your catheter as a result of bladder spasms. If the leakage stops, there is not much need for concern. If you are uncertain, call your caregiver.  You develop side effects that you think are coming from your medicines.  SEEK IMMEDIATE MEDICAL CARE IF:  You are suddenly unable to urinate. Check to see if there are any kinks in the drainage tubing that may cause this. If you cannot find any kinks, call your caregiver immediately. This is an emergency.  You develop shortness of breath or chest pains.  Bleeding persists or clots develop in your urine.  You have a fever.  You develop pain in your back or over your lower belly (abdomen).  You develop pain or swelling in your legs.  Any problems you are having get worse rather than better.  MAKE SURE YOU:  Understand these instructions.  Will watch your condition.  Will get help right away if you are not doing well or get worse.

## 2024-10-19 NOTE — Progress Notes (Signed)
 Post-op note  Subjective: The patient is doing well.  No complaints.  Objective: Vital signs in last 24 hours: Temp:  [96.2 F (35.7 C)-97.7 F (36.5 C)] 97 F (36.1 C) (11/06 1230) Pulse Rate:  [43-120] 91 (11/06 1530) Resp:  [13-26] 17 (11/06 1530) BP: (103-149)/(69-113) 127/76 (11/06 1530) SpO2:  [85 %-100 %] 98 % (11/06 1530) Weight:  [73.5 kg] 73.5 kg (11/06 0703)  Intake/Output from previous day: No intake/output data recorded. Intake/Output this shift: Total I/O In: 1000 [I.V.:1000] Out: -   Physical Exam:  General: Alert and oriented. Abdomen: Soft, Nondistended. GU: urine coolaid color but thin  Lab Results: No results for input(s): HGB, HCT in the last 72 hours.  Assessment/Plan: POD#0   1) Continue to monitor 2) Medicine has been consulted 40mg  Lasix  given      LOS: 0 days   Nathaniel [name] 10/19/2024, 3:55 PM

## 2024-10-19 NOTE — Plan of Care (Signed)
  Problem: Clinical Measurements: Goal: Ability to maintain clinical measurements within normal limits will improve Outcome: Progressing Goal: Will remain free from infection Outcome: Progressing Goal: Diagnostic test results will improve Outcome: Progressing Goal: Respiratory complications will improve Outcome: Progressing Goal: Cardiovascular complication will be avoided Outcome: Progressing   Problem: Pain Managment: Goal: General experience of comfort will improve and/or be controlled Outcome: Progressing   Problem: Skin Integrity: Goal: Risk for impaired skin integrity will decrease Outcome: Progressing   Problem: Safety: Goal: Ability to remain free from injury will improve Outcome: Progressing

## 2024-10-19 NOTE — Transfer of Care (Signed)
 Immediate Anesthesia Transfer of Care Note  Patient: Nathaniel [name].  Procedure(s) Performed: TURBT (TRANSURETHRAL RESECTION OF BLADDER TUMOR),LEFT RETROGRADE PYELOGRAM (Bladder)  Patient Location: PACU  Anesthesia Type:General  Level of Consciousness: awake, patient cooperative, and responds to stimulation  Airway & Oxygen Therapy: Patient Spontanous Breathing and Patient connected to nasal cannula oxygen  Post-op Assessment: Report given to RN and Post -op Vital signs reviewed and stable  Post vital signs: Reviewed and stable  Last Vitals:  Vitals Value Taken Time  BP 115/82 10/19/24 09:31  Temp    Pulse 84 10/19/24 09:39  Resp 14 10/19/24 09:39  SpO2 90 % 10/19/24 09:39  Vitals shown include unfiled device data.  Last Pain:  Vitals:   10/19/24 0703  TempSrc:   PainSc: 0-No pain         Complications: No notable events documented.

## 2024-10-19 NOTE — Progress Notes (Signed)
 Patient has no one at home per his granddaughter. DR. Showalter paged for admit orders, Dr Alveria aware and states pt has to have 24 care .

## 2024-10-19 NOTE — Op Note (Signed)
 Operative Note  Preoperative diagnosis:  1.  Gross hematuria concern for bladder tumor  Postoperative diagnosis: 1.  Bladder tumor overlying the left ureteral orifice  Procedure(s): 1.  2 cm TURBT 2.  Retrograde pyelogram 3.  Cystogram 4.  Left ureteral stent placement 5.  Urethral dilation  Surgeon: Shane Sensing MD  Assistants:  None  Anesthesia:  General  Complications:  None  EBL: Minimal  Specimens: 1.  Bladder tumor  Drains/Catheters: 1.  22 French two-way catheter 2.  6 x 26 double-J stent with strings  Intraoperative findings:   Large tumor resected down to the muscle over the left ureteral orifice. Tumor appeared to be protruding from the left ureteral orifice proper ureteral orifice was confirmed with wire placement and retrograde pyelogram The bladder had poor distention due to significant diverticulum in the right bladder gutter. Due to poor bladder distention resection was complex. Cystogram demonstrated no bladder perforation. 6 x 26 double-J stent with string was placed in the left ureter strings attached to the catheter Patient hemostatic at the end the case.   Indication:  Nathaniel [name]. is a 89 y.o. male with has had gross hematuria CT in the clinic demonstrated possible left bladder mass.  He is here today for TURBT he has been off Coumadin  for 2 weeks now.  All the risks, benefits were discussed with the patient to include but not limited to infection, pain, bleeding, damage to adjacent structures, need for further operations, adverse reaction to anesthesia and death.  Patient understands these risks and agrees to proceed with the operation as planned.    Description of procedure: After informed consent was obtained from the patient, the patient was taken to the operating room. General anesthesia was administered. The patient was placed in dorsal lithotomy position and prepped and draped in usual sterile fashion. Sequential compression devices  were applied to lower extremities at the beginning of the case for DVT prophylaxis. Antibiotics were infused prior to surgery start time. A surgical time-out was performed to properly identify the patient, the surgery to be performed, and the surgical site.     The urethra was dilated using using [employer] Donnelley sounds to 30 French sequentially from 20 French. We then passed the 26-French rigid cystoscope down the urethra and into the bladder under direct vision without any difficulty. The anterior urethral was normal. The prostate was nonobstructing.  Upon entering the bladder the bladder would not distend with pressure pressure was placed on the abdomen the abdomen did not appear to be filling because the blaster bladder was not distending with normal cystoscopic pressure decision was made to remove the scope place a 22 French catheter and do a cystogram.  Cystogram was completed this demonstrates that there was a large diverticulum moving onto the right side that was preventing the bladder filling appropriately and preventing adequate distention.  The resectoscope was then reintroduced into the bladder and we were able to achieve enough distention to visualize the tumor the right ureteral orifice was identified we were unable to identify the left ureter orifice.    The tumor was then resected and upon getting to the base of the tumor it was realized that at the base of the tumor was the left ureteral orifice resection was taken around the left ureteral orifice the resection was taken down to the detrusor.  The resection chips were then removed once all the tumor been resected.  The 21 French cystoscope was then reintroduced in the bladder and a  sensor was placed in the left ureteral orifice 5 French open-ended ureteral catheter was placed in the left ureteral orifice and a retrograde pyelogram was performed confirming this was the ureter.  Removing the wire and leaving the 5 French access catheter in place we  then cauterize around the ureter because there was significant bleeding in this area.  The wire was then reintroduced to the 5 French ureteral catheter and a 6 x 26 double-J stent with strings was placed over the wire under fluoroscopic guidance.  The resectoscope was then introduced in the bladder and confirmed that hemostasis was achieved.  Second cyst arthrogram was confirmed demonstrating no perforation.   We then proceeded with removing the resectoscope and then placed in a 50fr Foley catheter.  The patient tolerated the procedure well with no complication and was awoken from anesthesia and taken to recovery in stable condition.       Plan:   Patient will be discharged home with a catheter with a string taped to the catheter when he comes back to remove the catheter next week strings to be attached to catheter and stent can be removed. Patient will continue to hold Coumadin  until catheter is removed and then can restart 1 to 2 days post catheter removal.  Nathaniel [name]  [name]

## 2024-10-19 NOTE — Anesthesia Procedure Notes (Signed)
 Procedure Name: Intubation Date/Time: 10/19/2024 8:27 AM  Performed by: Leopoldo Wanda DASEN, CRNAPre-anesthesia Checklist: Patient identified, Emergency Drugs available, Suction available and Patient being monitored Patient Re-evaluated:Patient Re-evaluated prior to induction Oxygen Delivery Method: Circle System Utilized Preoxygenation: Pre-oxygenation with 100% oxygen Induction Type: IV induction Ventilation: Mask ventilation without difficulty Laryngoscope Size: Mac and 4 Grade View: Grade I Tube type: Oral Number of attempts: 1 Airway Equipment and Method: Stylet Placement Confirmation: ETT inserted through vocal cords under direct vision, positive ETCO2 and breath sounds checked- equal and bilateral Secured at: 23 cm Tube secured with: Tape Dental Injury: Teeth and Oropharynx as per pre-operative assessment  Difficulty Due To: Difficulty was anticipated and Difficult Airway- due to reduced neck mobility Comments: Glidescope on standby

## 2024-10-19 NOTE — Anesthesia Postprocedure Evaluation (Signed)
 Anesthesia Post Note  Patient: Nathaniel [name].  Procedure(s) Performed: TURBT (TRANSURETHRAL RESECTION OF BLADDER TUMOR),LEFT RETROGRADE PYELOGRAM (Bladder)     Patient location during evaluation: PACU Anesthesia Type: General Level of consciousness: awake and alert Pain management: pain level controlled Vital Signs Assessment: post-procedure vital signs reviewed and stable Respiratory status: spontaneous breathing, nonlabored ventilation, respiratory function stable and patient connected to nasal cannula oxygen Cardiovascular status: blood pressure returned to baseline and stable Postop Assessment: no apparent nausea or vomiting Anesthetic complications: no   No notable events documented.  Last Vitals:  Vitals:   10/19/24 1700 10/19/24 1719  BP: (!) 127/90 135/80  Pulse: (!) 46 (!) 53  Resp: (!) 24 20  Temp: 36.9 C 36.4 C  SpO2: 95% 98%    Last Pain:  Vitals:   10/19/24 1719  TempSrc: Oral  PainSc:                  Cordella SQUIBB Makynna Manocchio

## 2024-10-20 ENCOUNTER — Encounter (HOSPITAL_COMMUNITY): Payer: Self-pay | Admitting: Urology

## 2024-10-20 DIAGNOSIS — I5022 Chronic systolic (congestive) heart failure: Secondary | ICD-10-CM | POA: Diagnosis not present

## 2024-10-20 DIAGNOSIS — E118 Type 2 diabetes mellitus with unspecified complications: Secondary | ICD-10-CM

## 2024-10-20 DIAGNOSIS — E1122 Type 2 diabetes mellitus with diabetic chronic kidney disease: Secondary | ICD-10-CM | POA: Diagnosis not present

## 2024-10-20 DIAGNOSIS — N189 Chronic kidney disease, unspecified: Secondary | ICD-10-CM | POA: Diagnosis not present

## 2024-10-20 DIAGNOSIS — I482 Chronic atrial fibrillation, unspecified: Secondary | ICD-10-CM

## 2024-10-20 DIAGNOSIS — I509 Heart failure, unspecified: Secondary | ICD-10-CM | POA: Diagnosis not present

## 2024-10-20 DIAGNOSIS — I130 Hypertensive heart and chronic kidney disease with heart failure and stage 1 through stage 4 chronic kidney disease, or unspecified chronic kidney disease: Secondary | ICD-10-CM | POA: Diagnosis not present

## 2024-10-20 DIAGNOSIS — C676 Malignant neoplasm of ureteric orifice: Secondary | ICD-10-CM | POA: Diagnosis not present

## 2024-10-20 DIAGNOSIS — I2510 Atherosclerotic heart disease of native coronary artery without angina pectoris: Secondary | ICD-10-CM | POA: Diagnosis not present

## 2024-10-20 LAB — SURGICAL PATHOLOGY

## 2024-10-20 LAB — GLUCOSE, CAPILLARY
Glucose-Capillary: 167 mg/dL — ABNORMAL HIGH (ref 70–99)
Glucose-Capillary: 218 mg/dL — ABNORMAL HIGH (ref 70–99)

## 2024-10-20 NOTE — Hospital Course (Signed)
 89 year old man with suspected bladder mass on CT, underwent elective TURBT, stent placement, large bladder tumor resection 11/6.  Hospitalist service was consulted for medical management.  PMH includes atrial fibrillation on warfarin, CAD, CABG, ischemic cardiomyopathy.  Chronic systolic CHF Ischemic cardiomyopathy Bioprosthetic aortic valve Last echocardiogram showed LVEF 35%, bioprosthetic aortic valve  Chronic atrial fibrillation Status post watchman's Resume anticoagulation when able as per primary team  Hyperlipidemia Hypertension Diabetes mellitus type 2 diet controlled

## 2024-10-20 NOTE — Discharge Summary (Signed)
 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) 04/01/2022   Aortic atherosclerosis 02/19/2022   Secondary hypercoagulable state 02/19/2022   Controlled type 2 diabetes mellitus with complication, without long-term current use of insulin  (HCC) 02/19/2022   Chronic gout without tophus 02/19/2022   BPH (benign prostatic hyperplasia) 10/11/2021   Rhabdomyolysis 10/10/2021   Afib (HCC) 06/12/2019   Asymptomatic carotid artery stenosis without infarction, right 02/02/2018   Solitary pulmonary nodule 03/23/2016   Pulmonary nodule 12/25/2015   Aspiration of foreign body    Foreign body aspiration 12/23/2015   Encounter for therapeutic drug monitoring 03/28/2015   Chronic combined systolic and diastolic heart failure (HCC) 06/12/2014   AAA (abdominal aortic aneurysm) without rupture 04/13/2014   Occlusion and stenosis of carotid artery without mention of cerebral infarction 04/09/2014   CAD (coronary artery disease) of artery bypass graft 09/14/2012   Abdominal aortic aneurysm 09/14/2012   Peripheral vascular disease with claudication (HCC) 09/14/2012   Carotid artery disease without cerebral infarction (HCC) 09/14/2012   History of aortic valve replacement with bioprosthetic valve 09/14/2012   Essential hypertension 09/14/2012   Hyperlipidemia with target LDL less than 70 09/14/2012    Procedures performed: Procedure(s): TURBT (TRANSURETHRAL RESECTION OF BLADDER TUMOR),LEFT RETROGRADE PYELOGRAM  History and Physical: For full  details, please see admission history and physical. Briefly, Nathaniel [name]. is a 89 y.o. year old patient with w/ gross hematuria, taken to the OR for evaluation found to have tumor over the left ureter tumor resect stent placed pt admitted for social reasons.   Hospital Course: Patient tolerated the procedure well.  He was then transferred to the floor after an uneventful PACU stay.  His hospital course was uncomplicated. He did state that he did not take his HF medications that day so cariology was consulted and he was given lasix . On POD#1 he had met discharge criteria: was eating a regular diet, was up and ambulating independently,  pain was well controlled, and was ready to for discharge. He did well without overnight and was able to ambulate. Urine was clear at discharge.   Physical Exam: Gen: NAD  Resp: Slight increased WOB this is pt's baseline  Cards: Afib per monitor this is baseline Abdomen: Soft non tedner GU: catheter in place draining yellow urine with some brown sediment,    Laboratory values:  No results for input(s): WBC, HGB, HCT in the last 72 hours. No results for input(s): NA, K, CL, CO2, GLUCOSE, BUN, CREATININE, CALCIUM  in the last 72 hours. Recent Labs    10/19/24 0717  INR 1.2   No results for input(s): LABURIN in the last 72 hours. Results for orders placed or performed during the hospital encounter of 10/01/24  Urine Culture     Status: Abnormal   Collection Time: 10/01/24  7:32 PM   Specimen: Urine, Clean Catch  Result Value Ref Range Status   Specimen Description   Final    URINE, CLEAN CATCH Performed at [facility], [address], [place], [place] [address]    Special Requests  Final    NONE Performed at [facility], [address] [place], [place] [address]    Culture MULTIPLE SPECIES PRESENT, SUGGEST RECOLLECTION (A)  Final   Report Status 10/03/2024 FINAL  Final     Disposition: Home  Discharge instruction: The patient was instructed to be ambulatory but told to refrain from heavy lifting, strenuous activity, or driving.   Discharge medications:  Allergies as of 10/20/2024       Reactions   Barbiturates    Queasy and tired   Food    Yogurt makes him lethargic.    Grass Extracts [gramineae Pollens] Other (See Comments)   Sneezing and runny nose   Amiodarone  Anxiety   depression        Medication List     TAKE these medications    atorvastatin  10 MG tablet Commonly known as: LIPITOR Take 10 mg by mouth every other day.   atorvastatin  20 MG tablet Commonly known as: LIPITOR Take 1 tablet by mouth Daily.   Co Q10 200 MG Caps Take 200 mg by mouth daily.   dorzolamide -timolol  2-0.5 % ophthalmic solution Commonly known as: COSOPT  Place 1 drop into both eyes 2 (two) times daily.   ezetimibe  10 MG tablet Commonly known as: ZETIA  TAKE ONE TABLET BY MOUTH AT BEDTIME   Glucosamine Chondroitin Triple Tabs Take 1 tablet by mouth daily.   latanoprost  0.005 % ophthalmic solution Commonly known as: XALATAN  Place 1 drop into both eyes at bedtime.   METAMUCIL FIBER PO Take 1 Dose by mouth as needed (constipation).   methocarbamol 750 MG tablet Commonly known as: ROBAXIN Take 1 tablet (750 mg total) by mouth 4 (four) times daily for 20 doses.   polyethylene glycol 17 g packet Commonly known as: MiraLax  Take 17 g by mouth daily.   potassium chloride  SA 20 MEQ tablet Commonly known as: KLOR-CON  M TAKE ONE TABLET BY MOUTH TWICE DAILY   Repatha  SureClick 140 MG/ML Soaj Generic drug: Evolocumab  INJECT 1 PEN INTO SKIN EVERY 14 DAYS   spironolactone  25 MG tablet Commonly known as: ALDACTONE  TAKE ONE TABLET BY MOUTH EVERY DAY   tamsulosin  0.4 MG Caps capsule Commonly known as: FLOMAX  TAKE ONE CAPSULE BY MOUTH TWICE DAILY   torsemide  20 MG tablet Commonly known as: DEMADEX  Take 3 tablets (60 mg total) by mouth every  morning AND 2 tablets (40 mg total) every evening.   warfarin 3 MG tablet Commonly known as: COUMADIN  Take as directed. If you are unsure how to take this medication, talk to your nurse or doctor. Original instructions: TAKE TWO TO THREE TABLETS DAILY OR AS DIRECTED BY COUMADIN  CLINIC        Followup:   Follow-up Information     Claudene Waddell HERO, PA-C Follow up.   Why: YOu will be called to set up a catheter removed early next week. Contact information: [address] [place] [place] [address] [phone]

## 2024-10-20 NOTE — Progress Notes (Signed)
 The patient refused several PO medications this am. He stated that he received medications this am and did not feel comfortable taking at this time. Pt made aware there was no documentation to support medications being administered. Pt refused.

## 2024-10-20 NOTE — Progress Notes (Addendum)
 Discharge instructions/ Foley care instructions were reviewed with the patient. Discussed the importance of foley care. Pt states that he will have nurses at the independent living facility to assist with care. IV site removed, clean dry and intact. Telemetry returned to the desk. Discharge instructions were reviewed with the patient granddaughter. Foley care reviewed, she denied questions or concerns a this time. Supplies provider for [facility] care.

## 2024-10-20 NOTE — Progress Notes (Signed)
  Progress Note   Patient: Nathaniel [name]. FMW:[id number] DOB: [birth year] DOA: 10/19/2024     0 DOS: the patient was seen and examined on 10/20/2024   Brief hospital course: 89 year old man with suspected bladder mass on CT, underwent elective TURBT, stent placement, large bladder tumor resection 11/6.  Hospitalist service was consulted for medical management.  PMH includes atrial fibrillation on warfarin, CAD, CABG, ischemic cardiomyopathy.  Assessment and Plan: Chronic systolic CHF Ischemic cardiomyopathy Bioprosthetic aortic valve Last echocardiogram showed LVEF 35%, bioprosthetic aortic valve Appears compensated Continue chronic meds  Chronic atrial fibrillation Status post Watchman's Resume anticoagulation when able as per primary team  Hyperlipidemia Hypertension Diabetes mellitus type 2 diet controlled  Appears medically stable for discharge.  Conveyed to Dr. Shane   Subjective:  Feels fine  Physical Exam: Vitals:   10/19/24 1719 10/19/24 2134 10/20/24 0136 10/20/24 0624  BP: 135/80 (!) 129/92 116/75 104/69  Pulse: (!) 53 61 (!) 45 88  Resp: 20     Temp: 97.6 F (36.4 C) (!) 97.5 F (36.4 C)  98.3 F (36.8 C)  TempSrc: Oral Oral  Oral  SpO2: 98% 95% 93% 95%  Weight:      Height:       Physical Exam Vitals reviewed.  Constitutional:      General: He is not in acute distress.    Appearance: He is not ill-appearing or toxic-appearing.  Cardiovascular:     Rate and Rhythm: Normal rate. Rhythm irregular.     Heart sounds: No murmur heard.    Comments: Telemetry afib Pulmonary:     Effort: Pulmonary effort is normal. No respiratory distress.     Breath sounds: No wheezing, rhonchi or rales.  Neurological:     Mental Status: He is alert.  Psychiatric:        Mood and Affect: Mood normal.        Behavior: Behavior normal.     Data Reviewed: CBG stable INR 1.2 HgbA1c 6.3  Family Communication: none  Disposition: Status is:  Observation      Time spent: 25 minutes  Author: Toribio Door, MD 10/20/2024 8:28 AM  For on call review [email or web address].

## 2024-10-20 NOTE — Progress Notes (Signed)
   10/20/24 0839  TOC Brief Assessment  Insurance and Status Reviewed  Patient has primary care physician Yes  Home environment has been reviewed Resides in independent living at [facility]  Prior level of function: Independent with ADLs at baseline  Prior/Current Home Services No current home services  Social Drivers of Health Review SDOH reviewed no interventions necessary  Readmission risk has been reviewed Yes  Transition of care needs no transition of care needs at this time

## 2024-10-20 NOTE — Care Management Obs Status (Signed)
 MEDICARE OBSERVATION STATUS NOTIFICATION   Patient Details  Name: Nathaniel [name]. MRN: [id number] Date of Birth: 22-Jun-1932   Medicare Observation Status Notification Given:  Yes    Duwaine GORMAN Aran, LCSW 10/20/2024, 12:02 PM

## 2024-10-23 ENCOUNTER — Ambulatory Visit (HOSPITAL_COMMUNITY): Attending: Surgery

## 2024-10-23 ENCOUNTER — Ambulatory Visit: Admitting: Surgery

## 2024-10-23 ENCOUNTER — Ambulatory Visit (HOSPITAL_COMMUNITY): Admission: RE | Admit: 2024-10-23 | Source: Ambulatory Visit

## 2024-10-24 DIAGNOSIS — C674 Malignant neoplasm of posterior wall of bladder: Secondary | ICD-10-CM | POA: Diagnosis not present

## 2024-10-30 ENCOUNTER — Ambulatory Visit: Payer: Self-pay

## 2024-10-30 NOTE — Telephone Encounter (Signed)
 Patient scheduled to see Dr. Kennyth 10/31/2024. Dr. Katrinka will review triage notes.

## 2024-10-30 NOTE — Telephone Encounter (Signed)
 FYI Only or Action Required?: FYI only for provider: appointment scheduled on 11/18.  Patient was last seen in primary care on 07/06/2024 by Katrinka Garnette KIDD, MD.  Called Nurse Triage reporting Breathing Problem.  Symptoms began several months ago.  Interventions attempted: Rest, hydration, or home remedies.  Symptoms are: gradually worsening.  Triage Disposition: See PCP When Office is Open (Within 3 Days)  Patient/caregiver understands and will follow disposition?: Yes, will follow disposition  Copied from CRM #[id number]. Topic: Clinical - Red Word Triage >> Oct 30, 2024  3:52 PM Rea ORN wrote: Red Word that prompted transfer to Nurse Triage: SOB Reason for Disposition  [1] MODERATE longstanding difficulty breathing (e.g., speaks in phrases, SOB even at rest, pulse 100-120) AND [2] SAME as normal  Answer Assessment - Initial Assessment Questions 1. RESPIRATORY STATUS: Describe your breathing? (e.g., wheezing, shortness of breath, unable to speak, severe coughing)      SOB 2. ONSET: When did this breathing problem begin?      Few weeks, longer intermittent prior to that 3. PATTERN Does the difficult breathing come and go, or has it been constant since it started?      intermittent 4. SEVERITY: How bad is your breathing? (e.g., mild, moderate, severe)      moderate 5. RECURRENT SYMPTOM: Have you had difficulty breathing before? If Yes, ask: When was the last time? and What happened that time?      Has had this in the past but resolved on its own 6. CARDIAC HISTORY: Do you have any history of heart disease? (e.g., heart attack, angina, bypass surgery, angioplasty)      significant 7. LUNG HISTORY: Do you have any history of lung disease?  (e.g., pulmonary embolus, asthma, emphysema)     denies 8. CAUSE: What do you think is causing the breathing problem?      unsure 9. OTHER SYMPTOMS: Do you have any other symptoms? (e.g., chest pain, cough, dizziness, fever,  runny nose)     Denies cough, denies fever, denies runny nose, denies CP. 10. O2 SATURATION MONITOR:  Do you use an oxygen saturation monitor (pulse oximeter) at home? If Yes, ask: What is your reading (oxygen level) today? What is your usual oxygen saturation reading? (e.g., 95%)       Denies having equip  Protocols used: Breathing Difficulty-A-AH

## 2024-10-31 ENCOUNTER — Encounter: Payer: Self-pay | Admitting: Family Medicine

## 2024-10-31 ENCOUNTER — Ambulatory Visit (INDEPENDENT_AMBULATORY_CARE_PROVIDER_SITE_OTHER): Admitting: Family Medicine

## 2024-10-31 VITALS — BP 120/60 | HR 71 | Temp 97.3°F | Ht 70.0 in | Wt 175.6 lb

## 2024-10-31 DIAGNOSIS — I4891 Unspecified atrial fibrillation: Secondary | ICD-10-CM

## 2024-10-31 DIAGNOSIS — I5042 Chronic combined systolic (congestive) and diastolic (congestive) heart failure: Secondary | ICD-10-CM | POA: Diagnosis not present

## 2024-10-31 DIAGNOSIS — R0602 Shortness of breath: Secondary | ICD-10-CM

## 2024-10-31 LAB — COMPREHENSIVE METABOLIC PANEL WITH GFR
ALT: 10 U/L (ref 0–53)
AST: 20 U/L (ref 0–37)
Albumin: 3.9 g/dL (ref 3.5–5.2)
Alkaline Phosphatase: 86 U/L (ref 39–117)
BUN: 22 mg/dL (ref 6–23)
CO2: 24 meq/L (ref 19–32)
Calcium: 9.4 mg/dL (ref 8.4–10.5)
Chloride: 102 meq/L (ref 96–112)
Creatinine, Ser: 1.21 mg/dL (ref 0.40–1.50)
GFR: 52.69 mL/min — ABNORMAL LOW (ref >60.00)
Glucose, Bld: 117 mg/dL — ABNORMAL HIGH (ref 70–99)
Potassium: 4.7 meq/L (ref 3.5–5.1)
Sodium: 137 meq/L (ref 135–145)
Total Bilirubin: 1.6 mg/dL — ABNORMAL HIGH (ref 0.2–1.2)
Total Protein: 7 g/dL (ref 6.0–8.3)

## 2024-10-31 LAB — CBC
HCT: 41 % (ref 39.0–52.0)
Hemoglobin: 13.4 g/dL (ref 13.0–17.0)
MCHC: 32.7 g/dL (ref 30.0–36.0)
MCV: 92.8 fl (ref 78.0–100.0)
Platelets: 205 K/uL (ref 150.0–400.0)
RBC: 4.42 Mil/uL (ref 4.22–5.81)
RDW: 15.2 % (ref 11.5–15.5)
WBC: 8.5 K/uL (ref 4.0–10.5)

## 2024-10-31 LAB — TSH: TSH: 1.82 u[IU]/mL (ref 0.35–5.50)

## 2024-10-31 LAB — BRAIN NATRIURETIC PEPTIDE: Pro B Natriuretic peptide (BNP): 865 pg/mL — ABNORMAL HIGH (ref 0.0–100.0)

## 2024-10-31 MED ORDER — TORSEMIDE 20 MG PO TABS: ORAL_TABLET | ORAL | 1 refills | Status: DC

## 2024-10-31 NOTE — Progress Notes (Signed)
 Nathaniel [name] Nathaniel [name]. is a 89 y.o. male who presents today for an office visit.  Assessment/Plan:  Shortness of Breath Concern for CHF exacerbation.  He discontinued his torsemide  about a month ago due to concern with it interacting with his urologic medications.  He was diagnosed with bladder cancer last week.  He is up 13 pounds over the last 12 days and has significant pitting edema on exam today. He also is in A-fib with ventricular rate of 110 on his EKG which may be contributing as well.    Due to his age and other comorbidities we did recommend going to the Emergency Department for diuresis and rate control however he prefers to manage this at home if possible.  We did discuss benefit of going to emergency room including IV treatment which would be faster acting than what we can provide and also closer monitoring. However, he declined going to the Emergency Department at this point.   We will be restarting his torsemide  which she has been off of for the last month.  Will send a new prescription at the dose that his cardiologist previously prescribed.  Will also check labs today.  He will follow-up with me or his PCP in the next few days though we did discuss reasons to return to care and seek emergent care.    Subjective:  HPI:  See assessment / plan for status of chronic conditions.    Discussed the use of AI scribe software for clinical note transcription with the patient, who gave verbal consent to proceed.  History of Present Illness Nathaniel [name]. is a 89 year old male with atrial fibrillation and congestive heart failure who presents with worsening shortness of breath.  He has been experiencing worsening shortness of breath over the last several months, with a notable increase in severity over the past three to four weeks. He becomes breathless after walking just ten yards, whereas previously he could walk longer distances before experiencing such symptoms. No chest pain, cough,  fever, or rhinorrhea.  He has a history of atrial fibrillation and congestive heart failure. His shortness of breath worsened after he stopped taking certain medications, as advised by his urologist prior to a recent surgery for bladder cancer. He stopped taking torsemide  which he had been switched to from furosemide  recently. He has not been taking any diuretics for the past two weeks.  He reports significant swelling in his legs and ankles, which he attributes to fluid retention. He reports decreased urination and fluid retention, and has noticed swelling in his legs and ankles. He uses compression socks to manage the swelling.  He was recently diagnosed with bladder cancer following surgery about a week and a half ago. He has an upcoming appointment to discuss further management of his bladder cancer.  His daughter-in-law expressed concerns about potential interactions between his cardiology and urology medications.         Objective:  Physical Exam: BP 120/60   Temp (!) 97.3 F (36.3 C) (Temporal)   Ht 5' 10 (1.778 m)   Wt 175 lb 9.6 oz (79.7 kg)   BMI 25.20 kg/m   Wt Readings from Last 3 Encounters:  10/31/24 175 lb 9.6 oz (79.7 kg)  10/19/24 162 lb (73.5 kg)  10/10/24 166 lb (75.3 kg)   Gen: No acute distress, resting comfortably CV: Irregular with rapid heart rate.  Pulm: Normal work of breathing.  Speaking in full sentences.  Rales noted bilateral bases. MUSCULOSKELETAL: 3+  pitting edema to knees bilaterally.  Compression stockings in place. Neuro: Grossly normal, moves all extremities Psych: Normal affect and thought content  EKG: Afib. VR 110. No ischemic changes.   Time Spent: 45 minutes of total time was spent on the date of the encounter performing the following actions: chart review prior to seeing the patient, obtaining history, performing a medically necessary exam, counseling on the treatment plan, placing orders, and documenting in our EHR.        Worth HERO. Kennyth, MD 10/31/2024 11:02 AM

## 2024-10-31 NOTE — Patient Instructions (Signed)
 It was very nice to see you today!  VISIT SUMMARY: You visited us  today due to worsening shortness of breath, which has been more severe over the past few weeks. This is likely due to fluid retention from stopping your diuretic medication and your recent bladder surgery. We also noted an increase in your heart rate related to your atrial fibrillation.  YOUR PLAN: FLUID OVERLOAD WITH CHRONIC COMBINED SYSTOLIC AND DIASTOLIC HEART FAILURE: Your shortness of breath and swelling in your legs and ankles are due to fluid retention from stopping your diuretic medication. -Restart taking torsemide  as prescribed. -We will order blood work to check your kidney function. -Follow up with Dr. Kennyth or Dr. Katrinka on Friday. -Go to the ER if your symptoms get worse.  ATRIAL FIBRILLATION: Your heart rate is elevated, which may be contributing to your shortness of breath. -We performed an EKG which confirmed atrial fibrillation. -If outpatient management does not work, you may need to visit the ER for IV medications. -Please follow up closely with your cardiologist or primary care physician in the next few days.  Return in about 3 days (around 11/03/2024).   Take care, Dr Kennyth  PLEASE NOTE:  If you had any lab tests, please let us  know if you have not heard back within a few days. You may see your results on mychart before we have a chance to review them but we will give you a call once they are reviewed by us .   If we ordered any referrals today, please let us  know if you have not heard from their office within the next week.   If you had any urgent prescriptions sent in today, please check with the pharmacy within an hour of our visit to make sure the prescription was transmitted appropriately.   Please try these tips to maintain a healthy lifestyle:  Eat at least 3 REAL meals and 1-2 snacks per day.  Aim for no more than 5 hours between eating.  If you eat breakfast, please do so within one hour  of getting up.   Each meal should contain half fruits/vegetables, one quarter protein, and one quarter carbs (no bigger than a computer mouse)  Cut down on sweet beverages. This includes juice, soda, and sweet tea.   Drink at least 1 glass of water  with each meal and aim for at least 8 glasses per day  Exercise at least 150 minutes every week.

## 2024-11-01 DIAGNOSIS — C678 Malignant neoplasm of overlapping sites of bladder: Secondary | ICD-10-CM | POA: Diagnosis not present

## 2024-11-02 ENCOUNTER — Encounter: Payer: Self-pay | Admitting: Family Medicine

## 2024-11-02 ENCOUNTER — Ambulatory Visit (INDEPENDENT_AMBULATORY_CARE_PROVIDER_SITE_OTHER): Admitting: Family Medicine

## 2024-11-02 VITALS — BP 118/60 | HR 90 | Temp 97.7°F | Ht 70.0 in | Wt 168.8 lb

## 2024-11-02 DIAGNOSIS — I5042 Chronic combined systolic (congestive) and diastolic (congestive) heart failure: Secondary | ICD-10-CM | POA: Diagnosis not present

## 2024-11-02 DIAGNOSIS — C679 Malignant neoplasm of bladder, unspecified: Secondary | ICD-10-CM

## 2024-11-02 MED ORDER — GNP DIGITAL WEIGHT SCALE MISC: 0 refills | Status: DC

## 2024-11-02 NOTE — Patient Instructions (Signed)
 It was very nice to see you today!  VISIT SUMMARY: You visited us  today due to shortness of breath, which was linked to an exacerbation of your congestive heart failure after stopping your diuretics. We discussed your heart failure, atrial fibrillation, and recent diagnosis of muscle-invasive bladder cancer. We have adjusted your medications and provided guidance on your next steps.  YOUR PLAN: CHRONIC COMBINED SYSTOLIC AND DIASTOLIC HEART FAILURE: You experienced a worsening of your heart failure symptoms after stopping your diuretics, leading to fluid buildup and swelling. Your symptoms have improved since restarting your medication. -Continue taking torsemide  as prescribed: three tablets in the morning and two at night. -Restart potassium supplementation as discussed. -Monitor your weight daily using the home weight monitoring scale we prescribed. -Follow up with your cardiologist in January.  ATRIAL FIBRILLATION: We discussed how your atrial fibrillation is being managed alongside your heart failure treatment. -Continue with your current cardiac medications and monitor for any potential interactions.  MUSCLE-INVASIVE BLADDER CANCER: You have been diagnosed with a form of bladder cancer that has spread to the muscle. -We have referred you to an oncologist for further evaluation and management. -Discuss potential treatment options with the oncologist, including chemotherapy and non-surgical interventions.  Return if symptoms worsen or fail to improve.   Take care, Dr Kennyth  PLEASE NOTE:  If you had any lab tests, please let us  know if you have not heard back within a few days. You may see your results on mychart before we have a chance to review them but we will give you a call once they are reviewed by us .   If we ordered any referrals today, please let us  know if you have not heard from their office within the next week.   If you had any urgent prescriptions sent in today, please  check with the pharmacy within an hour of our visit to make sure the prescription was transmitted appropriately.   Please try these tips to maintain a healthy lifestyle:  Eat at least 3 REAL meals and 1-2 snacks per day.  Aim for no more than 5 hours between eating.  If you eat breakfast, please do so within one hour of getting up.   Each meal should contain half fruits/vegetables, one quarter protein, and one quarter carbs (no bigger than a computer mouse)  Cut down on sweet beverages. This includes juice, soda, and sweet tea.   Drink at least 1 glass of water  with each meal and aim for at least 8 glasses per day  Exercise at least 150 minutes every week.

## 2024-11-02 NOTE — Progress Notes (Signed)
 Nathaniel Lepage Rami Budhu. is a 89 y.o. male who presents today for an office visit.  Assessment/Plan:  Shortness of Breath / CHF His shortness of breath has improved significantly since our visit 2 days ago since restarting his torsemide .  He is down 7 pounds on our scale.  We discussed the importance of staying consistent with prescribed medication regimen to prevent future exacerbations.  We discussed importance of monitoring weight at home and gave prescription for a scale today as well.He will continue his regimen per cardiology and will follow up with them in a couple of months as previously planned.  We discussed reasons to return to care sooner.  Bladder cancer Patient had visit with urology yesterday and was diagnosed with locally invasive bladder cancer.  They had discussed surgical resection including cystectomy however he does not wish to pursue this at this point but would like to explore alternative treatment options.  Apparently his urologist referred him to see the oncologist but he has not yet heard from them.  Will place another referral today.  Will defer further management to his urologist and PCP.     Subjective:  HPI:  See assessment / plan for status of chronic conditions.  Patient is here today for follow-up.  I saw him 2 days ago in the office for shortness of breath.  There was concern for CHF exacerbation at that time due to him stopping his prior to his bladder procedure.  He was additionally up about 13 pounds from his baseline at that point and had significant lower extremity edema.  We diuretics several weeks ago recommended he restart his previous dose of torsemide .  Discussed the use of AI scribe software for clinical note transcription with the patient, who gave verbal consent to proceed.  History of Present Illness Nathaniel [name]. is a 89 year old male with congestive heart failure who presents with shortness of breath.  Two days ago, he experienced shortness of  breath, which was linked to a possible exacerbation of congestive heart failure after discontinuing his antidiuretics several weeks ago for a bladder procedure. At that time, he had gained approximately thirteen pounds from his baseline weight and had significant lower extremity edema.  Since restarting torsemide , he has noticed a significant improvement in his symptoms, including reduced shortness of breath. He has lost approximately seven pounds, as confirmed by the scale at his facility. He is currently taking five torsemide  tablets daily, three in the morning and two at night. He aims to reach a target weight of 162 pounds, down from his current weight of 168 pounds.  He has a history of atrial fibrillation and is considering resuming potassium supplementation due to diuretic use. He is concerned about potential medication interactions with his cardiac medications.  He has recently been diagnosed with a muscularly invasive form of bladder cancer, which has spread to the muscle. He is awaiting further consultation with an oncologist and is confused about the timeline for oncology appointments and treatment options.  He has a history of lung cancer, for which he previously received treatment at a cancer center. He resides in a facility where he has access to a scale at the nurse's station. He has been experiencing increased wobbliness over the past day, which may be related to fluid loss.         Objective:  Physical Exam: BP 118/60   Pulse 90   Temp 97.7 F (36.5 C) (Temporal)   Ht 5' 10 (1.778 m)  Wt 168 lb 12.8 oz (76.6 kg)   SpO2 97%   BMI 24.22 kg/m   Wt Readings from Last 3 Encounters:  11/02/24 168 lb 12.8 oz (76.6 kg)  10/31/24 175 lb 9.6 oz (79.7 kg)  10/19/24 162 lb (73.5 kg)    Gen: No acute distress, resting comfortably CV: Irregular with no murmurs appreciated Pulm: Normal work of breathing, clear to auscultation bilaterally with no crackles, wheezes, or  rhonchi Neuro: Grossly normal, moves all extremities Psych: Normal affect and thought content  Time Spent: 45 minutes of total time was spent on the date of the encounter performing the following actions: chart review prior to seeing the patient, obtaining history, performing a medically necessary exam, counseling on the treatment plan, placing orders, and documenting in our EHR.        Worth HERO. Kennyth, MD 11/02/2024 9:37 AM

## 2024-11-17 ENCOUNTER — Other Ambulatory Visit: Payer: Self-pay

## 2024-11-17 ENCOUNTER — Encounter (HOSPITAL_BASED_OUTPATIENT_CLINIC_OR_DEPARTMENT_OTHER): Payer: Self-pay | Admitting: *Deleted

## 2024-11-17 ENCOUNTER — Emergency Department (HOSPITAL_BASED_OUTPATIENT_CLINIC_OR_DEPARTMENT_OTHER)
Admission: EM | Admit: 2024-11-17 | Discharge: 2024-11-17 | Disposition: A | Discharge status: Home / Self Care | Attending: Emergency Medicine | Admitting: Emergency Medicine

## 2024-11-17 DIAGNOSIS — I2510 Atherosclerotic heart disease of native coronary artery without angina pectoris: Secondary | ICD-10-CM | POA: Insufficient documentation

## 2024-11-17 DIAGNOSIS — N3000 Acute cystitis without hematuria: Secondary | ICD-10-CM | POA: Diagnosis not present

## 2024-11-17 DIAGNOSIS — R338 Other retention of urine: Secondary | ICD-10-CM

## 2024-11-17 DIAGNOSIS — Z7901 Long term (current) use of anticoagulants: Secondary | ICD-10-CM | POA: Diagnosis not present

## 2024-11-17 DIAGNOSIS — R339 Retention of urine, unspecified: Secondary | ICD-10-CM | POA: Insufficient documentation

## 2024-11-17 LAB — CBC WITH DIFFERENTIAL/PLATELET
Abs Immature Granulocytes: 0.02 K/uL (ref 0.00–0.07)
Basophils Absolute: 0.1 K/uL (ref 0.0–0.1)
Basophils Relative: 1 %
Eosinophils Absolute: 0.1 K/uL (ref 0.0–0.5)
Eosinophils Relative: 2 %
HCT: 40.1 % (ref 39.0–52.0)
Hemoglobin: 12.9 g/dL — ABNORMAL LOW (ref 13.0–17.0)
Immature Granulocytes: 0 %
Lymphocytes Relative: 17 %
Lymphs Abs: 1 K/uL (ref 0.7–4.0)
MCH: 29.5 pg (ref 26.0–34.0)
MCHC: 32.2 g/dL (ref 30.0–36.0)
MCV: 91.6 fL (ref 80.0–100.0)
Monocytes Absolute: 0.6 K/uL (ref 0.1–1.0)
Monocytes Relative: 11 %
Neutro Abs: 3.9 K/uL (ref 1.7–7.7)
Neutrophils Relative %: 69 %
Platelets: 167 K/uL (ref 150–400)
RBC: 4.38 MIL/uL (ref 4.22–5.81)
RDW: 13.8 % (ref 11.5–15.5)
WBC: 5.7 K/uL (ref 4.0–10.5)
nRBC: 0 % (ref 0.0–0.2)

## 2024-11-17 LAB — BASIC METABOLIC PANEL WITH GFR
Anion gap: 12 (ref 5–15)
BUN: 25 mg/dL — ABNORMAL HIGH (ref 8–23)
CO2: 28 mmol/L (ref 22–32)
Calcium: 9.9 mg/dL (ref 8.9–10.3)
Chloride: 97 mmol/L — ABNORMAL LOW (ref 98–111)
Creatinine, Ser: 1.4 mg/dL — ABNORMAL HIGH (ref 0.61–1.24)
GFR, Estimated: 48 mL/min — ABNORMAL LOW (ref >60)
Glucose, Bld: 155 mg/dL — ABNORMAL HIGH (ref 70–99)
Potassium: 3.9 mmol/L (ref 3.5–5.1)
Sodium: 136 mmol/L (ref 135–145)

## 2024-11-17 LAB — URINALYSIS, ROUTINE W REFLEX MICROSCOPIC
Bilirubin Urine: NEGATIVE
Glucose, UA: NEGATIVE mg/dL
Ketones, ur: NEGATIVE mg/dL
Nitrite: NEGATIVE
Protein, ur: NEGATIVE mg/dL
Specific Gravity, Urine: 1.011 (ref 1.005–1.030)
pH: 5.5 (ref 5.0–8.0)

## 2024-11-17 LAB — PROTIME-INR
INR: 1.1 (ref 0.8–1.2)
Prothrombin Time: 14.5 s (ref 11.4–15.2)

## 2024-11-17 MED ORDER — CEPHALEXIN 500 MG PO CAPS: 500.0000 mg | ORAL_CAPSULE | Freq: Four times a day (QID) | ORAL | 0 refills | Status: DC

## 2024-11-17 NOTE — ED Notes (Signed)
 Patient's granddaughter calling for update. Per patient and patient's sister at bedside, they prefer to update patient themselves. This RN told granddaughter to expect a call from family and that this RN could not update her per patient request.

## 2024-11-17 NOTE — ED Provider Notes (Signed)
 [facility] AT [facility] Provider Note   CSN: [id number] Arrival date & time: 11/17/24  1105     Patient presents with: Urinary Retention   Nathaniel [name]. is a 89 y.o. male.   HPI    89 year old patient comes in with chief complaint of urinary retention. Patient accompanied by daughter.  Patient has history of CAD, abdominal aortic aneurysm, PVD and a recent diagnosis of bladder tumor status post turbt.  Patient indicates that he has not had any void in about 18 hours.  He is having some abdominal discomfort.  Patient denies any nausea, vomiting, fevers, chills.  He did not see any blood in the urine recently.  His Foley catheter was removed sometime in November after he had the procedure on 11-7.  Patient is supposed to follow-up with oncology next week.  Prior to Admission medications   Medication Sig Start Date End Date Taking? Authorizing Provider  atorvastatin  (LIPITOR) 10 MG tablet Take 10 mg by mouth every other day. 06/07/24   [provider]  atorvastatin  (LIPITOR) 20 MG tablet Take 1 tablet by mouth Daily. 06/12/24   Rolan Ezra RAMAN, MD  Coenzyme Q10 (CO Q10) 200 MG CAPS Take 200 mg by mouth daily.    [provider]  dorzolamide -timolol  (COSOPT ) 22.3-6.8 MG/ML ophthalmic solution Place 1 drop into both eyes 2 (two) times daily.    [provider]  ezetimibe  (ZETIA ) 10 MG tablet TAKE ONE TABLET BY MOUTH AT BEDTIME 02/07/24   Katrinka Garnette KIDD, MD  latanoprost  (XALATAN ) 0.005 % ophthalmic solution Place 1 drop into both eyes at bedtime.    [provider]  METAMUCIL FIBER PO Take 1 Dose by mouth as needed (constipation).    [provider]  Misc Natural Products (GLUCOSAMINE CHONDROITIN TRIPLE) TABS Take 1 tablet by mouth daily.    [provider]  Misc. Devices (GNP DIGITAL WEIGHT SCALE) MISC Use daily as needed to measure weight for fluid retention due to CHF 11/02/24   Kennyth Worth HERO, MD   polyethylene glycol (MIRALAX ) 17 g packet Take 17 g by mouth daily. 10/19/24   Shane Steffan BROCKS, MD  potassium chloride  SA (KLOR-CON  M) 20 MEQ tablet TAKE ONE TABLET BY MOUTH TWICE DAILY 06/07/24   Rolan Ezra RAMAN, MD  REPATHA  SURECLICK 140 MG/ML SOAJ INJECT 1 PEN INTO SKIN EVERY 14 DAYS 02/08/24   Rolan Ezra RAMAN, MD  spironolactone  (ALDACTONE ) 25 MG tablet TAKE ONE TABLET BY MOUTH EVERY DAY 08/13/23   Rolan Ezra RAMAN, MD  tamsulosin  (FLOMAX ) 0.4 MG CAPS capsule TAKE ONE CAPSULE BY MOUTH TWICE DAILY 03/25/23   Katrinka Garnette KIDD, MD  torsemide  (DEMADEX ) 20 MG tablet Take 3 tablets (60 mg total) by mouth every morning AND 2 tablets (40 mg total) every evening. 10/31/24   Parker, Caleb M, MD  warfarin (COUMADIN ) 3 MG tablet TAKE TWO TO THREE TABLETS DAILY OR AS DIRECTED BY COUMADIN  CLINIC 08/28/24   Rolan Ezra RAMAN, MD    Allergies: Barbiturates, Food, Grass extracts [gramineae pollens], and Amiodarone     Review of Systems  All other systems reviewed and are negative.   Updated Vital Signs BP 123/87   Pulse (!) 55   Temp (!) 97.4 F (36.3 C) (Oral)   Resp 16   SpO2 96%   Physical Exam Vitals and nursing note reviewed.  Constitutional:      Appearance: He is well-developed.  HENT:     Head: Atraumatic.  Eyes:  Extraocular Movements: Extraocular movements intact.     Pupils: Pupils are equal, round, and reactive to light.  Cardiovascular:     Rate and Rhythm: Normal rate.  Pulmonary:     Effort: Pulmonary effort is normal.  Musculoskeletal:     Cervical back: Neck supple.  Skin:    General: Skin is warm.  Neurological:     Mental Status: He is alert and oriented to person, place, and time.     (all labs ordered are listed, but only abnormal results are displayed) Labs Reviewed  URINALYSIS, ROUTINE W REFLEX MICROSCOPIC - Abnormal; Notable for the following components:      Result Value   APPearance HAZY (*)    Hgb urine dipstick MODERATE (*)    Leukocytes,Ua  MODERATE (*)    Bacteria, UA MANY (*)    All other components within normal limits  BASIC METABOLIC PANEL WITH GFR - Abnormal; Notable for the following components:   Chloride 97 (*)    Glucose, Bld 155 (*)    BUN 25 (*)    Creatinine, Ser 1.40 (*)    GFR, Estimated 48 (*)    All other components within normal limits  CBC WITH DIFFERENTIAL/PLATELET - Abnormal; Notable for the following components:   Hemoglobin 12.9 (*)    All other components within normal limits  URINE CULTURE  PROTIME-INR    EKG: None  Radiology: No results found.   Procedures   Medications Ordered in the ED - No data to display                                  Medical Decision Making Amount and/or Complexity of Data Reviewed Labs: ordered.  Risk Prescription drug management.   89 year old male with a recent diagnosis of bladder cancer comes in with chief complaint of acute urinary retention.  Patient has not had a void in about 18 hours.  Collateral history.  By patient's daughter.  Have also reviewed patient's record including discharge summary and procedure note from 11-7.  Bladder scan was completed in the ER that reveals more than 500 cc of urine. Differential diagnosis considered this patient includes acute urinary retention because of UTI, urinary retention due to mechanical obstruction from blood clot, enlarged prostate, bladder dysfunction and medication side effects.  Foley catheter was inserted.  There was no blood in the void.  Patient feels more comfortable.  Labs are overall reassuring.  UA does have some markers that are concerning for infection such as pyuria and bacteriuria.  Given his recent instrumentation, we will put him on Keflex  and get urine cultures.  He will follow-up with the urologist next week.  Return precautions discussed.  Final diagnoses:  Acute urinary retention    ED Discharge Orders     None          [name], MD 11/17/24 1325

## 2024-11-17 NOTE — Discharge Instructions (Addendum)
 You were seen in the ER for abdominal discomfort.  Our workup reveals that you have acute urinary retention.  Also there is a possibility of urinary tract infection.  Given your recent surgical management by urologist, we recommend that you call them for a prompt follow-up appointment.  Return to the ER if your symptoms get worse.  Start taking the antibiotics that are prescribed.

## 2024-11-17 NOTE — ED Triage Notes (Signed)
 Pt states that he has been unable to urinate for 24 hours and no BM for 3-4 days.

## 2024-11-19 LAB — URINE CULTURE: Culture: >=100000 — AB

## 2024-11-20 ENCOUNTER — Telehealth (HOSPITAL_BASED_OUTPATIENT_CLINIC_OR_DEPARTMENT_OTHER): Payer: Self-pay

## 2024-11-20 NOTE — Telephone Encounter (Signed)
 Post ED Visit - Positive Culture Follow-up: Successful Patient Follow-Up  Culture assessed and recommendations reviewed by:  [x]  Leonor Bash, Pharm.D. []  Venetia Gully, Pharm.D., BCPS AQ-ID []  Garrel Crews, Pharm.D., BCPS []  Almarie Lunger, [address].D., BCPS []  [place], [address].D., BCPS, AAHIVP []  Rosaline Bihari, Pharm.D., BCPS, AAHIVP []  Vernell Meier, PharmD, BCPS []  Latanya Hint, PharmD, BCPS []  Donald Medley, PharmD, BCPS []  Rocky Bold, PharmD  Positive urine culture  []  Patient discharged without antimicrobial prescription and treatment is now indicated []  Organism is resistant to prescribed ED discharge antimicrobial []  Patient with positive blood cultures  Changes discussed with ED provider: Sonny Darice Showers, PA-C New antibiotic prescription Ampicillin 500 mg po Q 12 hours x 3 days Called to [facility] pharmacy  Contacted patient, date 11/20/24, time 8:30 am   Nathaniel [name] 11/20/2024, 8:48 AM

## 2024-11-20 NOTE — Progress Notes (Signed)
 ED Antimicrobial Stewardship Positive Culture Follow Up   Nathaniel [name]. is an 89 y.o. male who presented to [facility] with a chief complaint of  Chief Complaint  Patient presents with   Urinary Retention    Recent Results (from the past 720 hours)  Urine Culture     Status: Abnormal   Collection Time: 11/17/24 12:03 PM   Specimen: Urine, Clean Catch  Result Value Ref Range Status   Specimen Description   Final    URINE, CLEAN CATCH Performed at [facility], [address], [place], [place] [address]    Special Requests   Final    NONE Performed at [facility], [address] [place], [place] [address]    Culture >=100,000 COLONIES/mL ENTEROCOCCUS FAECALIS (A)  Final   Report Status 11/19/2024 FINAL  Final   Organism ID, Bacteria ENTEROCOCCUS FAECALIS (A)  Final      Susceptibility   Enterococcus faecalis - MIC*    AMPICILLIN <=2 SENSITIVE Sensitive     NITROFURANTOIN  <=16 SENSITIVE Sensitive     VANCOMYCIN 1 SENSITIVE Sensitive     * >=100,000 COLONIES/mL ENTEROCOCCUS FAECALIS    Treated with keflex , organism resistant to prescribed antimicrobial  New antibiotic prescription: ampicillin 500mg  q12h x 3 days  ED Provider: FREDRIK Darice Showers, PA-C   [place] 11/20/2024, 8:05 AM Clinical Pharmacist Monday - Friday phone -  [phone] Saturday - Sunday phone - [phone]

## 2024-11-21 NOTE — Progress Notes (Unsigned)
 [facility] CONSULT NOTE  Patient Care Team: Katrinka Garnette KIDD, MD as PCP - General (Family Medicine) Inocencio Soyla Lunger, MD as PCP - Electrophysiology (Cardiology) Edith, Debby BROCKS, MD (Cardiology)  ASSESSMENT & PLAN:  Nathaniel [name] is a 89 y.o.male with history of CAD, infrarenal abdominal aortic aneurysm status post aortobiiliac stent graft repair, PVD, carotid artery disease, heart failure with EF 35%, hypertension, hyperlipidemia, type 2 diabetes, [facility] urothelial carcinoma.  Diagnosis: T2 MIBC  Recent TURBT showed high-grade muscle invasive urothelial carcinoma. Assessment & Plan   No orders of the defined types were placed in this encounter.   I personally spent a total of *** minutes in the care of the patient today including {Time Based Coding:[id number]}.   All questions were answered. The patient knows to call the clinic with any problems, questions or concerns. No barriers to learning was detected.  Pauletta BROCKS [place]ua, MD 12/9/20259:16 AM  CHIEF COMPLAINTS/PURPOSE OF CONSULTATION:  Urothelial carcinoma  HISTORY OF PRESENTING ILLNESS:  Nathaniel [name]. 89 y.o. male is here because of urothelial carcinoma. I have reviewed his chart and materials related to his cancer extensively and collaborated history with the patient. Summary of oncologic history is as follows:   Oncology History  Urothelial carcinoma of bladder (HCC)  10/19/2024 Initial Diagnosis   Urothelial carcinoma of bladder (HCC)   11/21/2024 Cancer Staging   Staging form: Urinary Bladder, AJCC 8th Edition - Clinical: Stage II (cT2, cN0, cM0) - Signed by Chihuahua Pauletta BROCKS, MD on 11/21/2024 WHO/ISUP grade (low/high): High Grade Histologic grading system: 2 grade system     MEDICAL HISTORY:  Past Medical History:  Diagnosis Date   AAA (abdominal aortic aneurysm)    Atrial fibrillation (HCC)    CAD (coronary artery disease)    Cataract    CHF (congestive heart  failure) (HCC)    Chronic kidney disease    Colon polyps    adenomatous   Diabetes mellitus    Diverticulosis    Dysrhythmia    A-Fib   Foley catheter in place    GERD (gastroesophageal reflux disease)    Gout    Hernia, abdominal    History of aortic valve replacement with bioprosthetic valve    2007   Hx: UTI (urinary tract infection)    Hyperlipemia    Hypertension    Internal hemorrhoids    Myocardial infarction (HCC)    Peripheral vascular disease    Scarlet fever    Shortness of breath    with exertion    SURGICAL HISTORY: Past Surgical History:  Procedure Laterality Date   ABDOMINAL AORTIC ENDOVASCULAR STENT GRAFT N/A 04/13/2014   Procedure: ABDOMINAL AORTIC ENDOVASCULAR STENT GRAFT;  Surgeon: Gaile LELON New, MD;  Location: [facility] OR;  Service: Vascular;  Laterality: N/A;   ANGIOPLASTY     Unsure if he had stents put in    APPENDECTOMY     CARDIAC VALVE SURGERY     CARDIOVERSION N/A 06/06/2014   Procedure: CARDIOVERSION;  Surgeon: Ezra GORMAN Shuck, MD;  Location: [facility] ENDOSCOPY;  Service: Cardiovascular;  Laterality: N/A;   CARDIOVERSION N/A 08/21/2014   Procedure: CARDIOVERSION;  Surgeon: Ezra GORMAN Shuck, MD;  Location: [facility] ENDOSCOPY;  Service: Cardiovascular;  Laterality: N/A;   CARDIOVERSION N/A 12/22/2017   Procedure: CARDIOVERSION;  Surgeon: Shuck Ezra GORMAN, MD;  Location: [facility] ENDOSCOPY;  Service: Cardiovascular;  Laterality: N/A;   CARDIOVERSION N/A 05/25/2019   Procedure: CARDIOVERSION;  Surgeon: Shuck Ezra GORMAN, MD;  Location: [facility] ENDOSCOPY;  Service: Cardiovascular;  Laterality: N/A;   CARDIOVERSION N/A 06/14/2019   Procedure: CARDIOVERSION;  Surgeon: Jeffrie Oneil BROCKS, MD;  Location: [facility] ENDOSCOPY;  Service: Cardiovascular;  Laterality: N/A;   CARDIOVERSION N/A 12/12/2020   Procedure: CARDIOVERSION;  Surgeon: Rolan Ezra RAMAN, MD;  Location: [facility] ENDOSCOPY;  Service: Cardiovascular;  Laterality: N/A;   CARDIOVERSION N/A 01/29/2021   Procedure: CARDIOVERSION;  Surgeon: Rolan Ezra RAMAN, MD;  Location: [facility] ENDOSCOPY;  Service: Cardiovascular;  Laterality: N/A;   CARDIOVERSION N/A 03/19/2022   Procedure: CARDIOVERSION;  Surgeon: Rolan Ezra RAMAN, MD;  Location: [facility] ENDOSCOPY;  Service: Cardiovascular;  Laterality: N/A;   CARDIOVERSION N/A 08/24/2022   Procedure: CARDIOVERSION;  Surgeon: Rolan Ezra RAMAN, MD;  Location: [facility] ENDOSCOPY;  Service: Cardiovascular;  Laterality: N/A;   CARDIOVERSION N/A 09/23/2022   Procedure: CARDIOVERSION;  Surgeon: Rolan Ezra RAMAN, MD;  Location: [facility] ENDOSCOPY;  Service: Cardiovascular;  Laterality: N/A;   CORONARY ARTERY BYPASS GRAFT     ENDARTERECTOMY Right 02/02/2018   Procedure: ENDARTERECTOMY CAROTID RIGHT;  Surgeon: Serene Gaile ORN, MD;  Location: [facility] OR;  Service: Vascular;  Laterality: Right;   EYE SURGERY Bilateral    cataract surgery   HERNIA REPAIR     2 operations   LEFT HEART CATHETERIZATION WITH CORONARY/GRAFT ANGIOGRAM N/A 07/10/2014   Procedure: LEFT HEART CATHETERIZATION WITH EL BILE;  Surgeon: Ezra RAMAN Rolan, MD;  Location: [facility] CATH LAB;  Service: Cardiovascular;  Laterality: N/A;   RIGHT HEART CATH N/A 03/21/2024   Procedure: RIGHT HEART CATH;  Surgeon: Rolan Ezra RAMAN, MD;  Location: [facility] INVASIVE CV LAB;  Service: Cardiovascular;  Laterality: N/A;   RIGHT HEART CATH AND CORONARY/GRAFT ANGIOGRAPHY N/A 08/21/2022   Procedure: RIGHT HEART CATH AND CORONARY/GRAFT ANGIOGRAPHY;  Surgeon: Rolan Ezra RAMAN, MD;  Location: [facility] INVASIVE CV LAB;  Service: Cardiovascular;  Laterality: N/A;   TEE WITHOUT CARDIOVERSION N/A 05/25/2019   Procedure: TRANSESOPHAGEAL ECHOCARDIOGRAM (TEE);  Surgeon: Rolan Ezra RAMAN, MD;  Location: [facility] ENDOSCOPY;  Service: Cardiovascular;  Laterality: N/A;   TEE WITHOUT CARDIOVERSION N/A 01/29/2021   Procedure: TRANSESOPHAGEAL ECHOCARDIOGRAM (TEE);  Surgeon: Rolan Ezra RAMAN, MD;  Location: [facility] ENDOSCOPY;  Service: Cardiovascular;  Laterality: N/A;   TEE WITHOUT CARDIOVERSION N/A 03/19/2022   Procedure:  TRANSESOPHAGEAL ECHOCARDIOGRAM (TEE);  Surgeon: Rolan Ezra RAMAN, MD;  Location: [facility] ENDOSCOPY;  Service: Cardiovascular;  Laterality: N/A;   TEE WITHOUT CARDIOVERSION N/A 08/24/2022   Procedure: TRANSESOPHAGEAL ECHOCARDIOGRAM (TEE);  Surgeon: Rolan Ezra RAMAN, MD;  Location: [facility] ENDOSCOPY;  Service: Cardiovascular;  Laterality: N/A;   TEE WITHOUT CARDIOVERSION N/A 09/23/2022   Procedure: TRANSESOPHAGEAL ECHOCARDIOGRAM (TEE);  Surgeon: Rolan Ezra RAMAN, MD;  Location: [facility] ENDOSCOPY;  Service: Cardiovascular;  Laterality: N/A;   TONSILLECTOMY     TRANSESOPHAGEAL ECHOCARDIOGRAM (CATH LAB) N/A 03/22/2024   Procedure: TRANSESOPHAGEAL ECHOCARDIOGRAM;  Surgeon: Rolan Ezra RAMAN, MD;  Location: [facility] INVASIVE CV LAB;  Service: Cardiovascular;  Laterality: N/A;   TRANSURETHRAL RESECTION OF BLADDER TUMOR N/A 10/19/2024   Procedure: TURBT (TRANSURETHRAL RESECTION OF BLADDER TUMOR),LEFT RETROGRADE PYELOGRAM;  Surgeon: Shane Steffan BROCKS, MD;  Location: [facility] ORS;  Service: Urology;  Laterality: N/A;   VIDEO BRONCHOSCOPY Bilateral 12/25/2015   Procedure: VIDEO BRONCHOSCOPY WITHOUT FLUORO;  Surgeon: Tonnie FORBES Flicker, MD;  Location: [facility] ENDOSCOPY;  Service: Cardiopulmonary;  Laterality: Bilateral;    SOCIAL HISTORY: Social History   Socioeconomic History   Marital status: Widowed    Spouse name: Not on file   Number of children: Not on file  Years of education: Not on file   Highest education level: Not on file  Occupational History   Occupation: [employer]   Tobacco Use   Smoking status: Former    Current packs/day: 0.00    Average packs/day: 0.3 packs/day for 20.0 years (5.0 ttl pk-yrs)    Types: Cigarettes    Start date: 03/23/1972    Quit date: 03/23/1992    Years since quitting: 32.6    Passive exposure: Past   Smokeless tobacco: Never   Tobacco comments:    Former smoker 09/16/22  Vaping Use   Vaping status: Never Used  Substance and Sexual Activity   Alcohol use: Not Currently   Drug use: No    Sexual activity: Not Currently  Other Topics Concern   Not on file  Social History Narrative   Widowed (lives alone at [place])- wife died at 64 (cancer at 74). Daughter 75, son 31 in 2023. 5 grandkids. 5 greatgrandkids.    -when patient were 58- started caring for 3 of his grandkids when daughter was not in a good place in the air[employer].       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      Hobbies: golf, enjoys football and basketball at [employer]: Low Risk  (07/11/2024)   Overall Financial Resource Strain (CARDIA)    Difficulty of Paying Living Expenses: Not hard at all  Food Insecurity: No Food Insecurity (10/19/2024)   Hunger Vital Sign    Worried About Running Out of Food in the Last Year: Never true    Ran Out of Food in the Last Year: Never true  Transportation Needs: No Transportation Needs (10/19/2024)   PRAPARE - Administrator, Civil Service (Medical): No    Lack of Transportation (Non-Medical): No  Physical Activity: Sufficiently Active (07/11/2024)   Exercise Vital Sign    Days of Exercise per Week: 5 days    Minutes of Exercise per Session: 30 min  Stress: No Stress Concern Present (07/11/2024)   [employer] of Occupational Health - Occupational Stress Questionnaire    Feeling of Stress: Not at all  Social Connections: Moderately Isolated (10/19/2024)   Social Connection and Isolation Panel    Frequency of Communication with Friends and Family: More than three times a week    Frequency of Social Gatherings with Friends and Family: More than three times a week    Attends Religious Services: 1 to 4 times per year    Active Member of [employer] or Organizations: No    Attends Banker Meetings: Never    Marital Status: Widowed  Intimate Partner Violence: Not At Risk (10/19/2024)   Humiliation, Afraid, Rape, and Kick questionnaire    Fear of Current or Ex-Partner:  No    Emotionally Abused: No    Physically Abused: No    Sexually Abused: No    FAMILY HISTORY: Family History  Problem Relation Age of Onset   Heart disease Mother    Heart disease Father        before age 70   AAA (abdominal aortic aneurysm) Father    Melanoma Sister        with mets. died 85   Colon cancer Neg Hx     ALLERGIES:  is allergic to barbiturates, food, grass extracts [gramineae pollens], and amiodarone .  MEDICATIONS:  Current Outpatient Medications  Medication Sig Dispense Refill   atorvastatin  (  LIPITOR) 10 MG tablet Take 10 mg by mouth every other day.     atorvastatin  (LIPITOR) 20 MG tablet Take 1 tablet by mouth Daily. 90 tablet 3   cephALEXin  (KEFLEX ) 500 MG capsule Take 1 capsule (500 mg total) by mouth 4 (four) times daily. 28 capsule 0   Coenzyme Q10 (CO Q10) 200 MG CAPS Take 200 mg by mouth daily.     dorzolamide -timolol  (COSOPT ) 22.3-6.8 MG/ML ophthalmic solution Place 1 drop into both eyes 2 (two) times daily.     ezetimibe  (ZETIA ) 10 MG tablet TAKE ONE TABLET BY MOUTH AT BEDTIME 90 tablet 3   latanoprost  (XALATAN ) 0.005 % ophthalmic solution Place 1 drop into both eyes at bedtime.     METAMUCIL FIBER PO Take 1 Dose by mouth as needed (constipation).     Misc Natural Products (GLUCOSAMINE CHONDROITIN TRIPLE) TABS Take 1 tablet by mouth daily.     Misc. Devices (GNP DIGITAL WEIGHT SCALE) MISC Use daily as needed to measure weight for fluid retention due to CHF 1 each 0   polyethylene glycol (MIRALAX ) 17 g packet Take 17 g by mouth daily. 14 each 0   potassium chloride  SA (KLOR-CON  M) 20 MEQ tablet TAKE ONE TABLET BY MOUTH TWICE DAILY 90 tablet 3   REPATHA  SURECLICK 140 MG/ML SOAJ INJECT 1 PEN INTO SKIN EVERY 14 DAYS 2 mL 11   spironolactone  (ALDACTONE ) 25 MG tablet TAKE ONE TABLET BY MOUTH EVERY DAY 30 tablet 5   tamsulosin  (FLOMAX ) 0.4 MG CAPS capsule TAKE ONE CAPSULE BY MOUTH TWICE DAILY 180 capsule 3   torsemide  (DEMADEX ) 20 MG tablet Take 3 tablets (60  mg total) by mouth every morning AND 2 tablets (40 mg total) every evening. 180 tablet 1   warfarin (COUMADIN ) 3 MG tablet TAKE TWO TO THREE TABLETS DAILY OR AS DIRECTED BY COUMADIN  CLINIC 90 tablet 0   No current facility-administered medications for this visit.    REVIEW OF SYSTEMS:   All relevant systems were reviewed with the patient and are negative.  PHYSICAL EXAMINATION: ECOG PERFORMANCE STATUS: {CHL ONC ECOG [phone]}  There were no vitals filed for this visit. There were no vitals filed for this visit.  GENERAL: alert, no distress and comfortable SKIN: skin color is normal, no jaundice, rashes or significant lesions EYES: sclera clear OROPHARYNX: no exudate, no erythema NECK: supple LYMPH:  no palpable lymphadenopathy in the cervical, axillary regions LUNGS: Effort normal, no respiratory distress.  Clear to auscultation bilaterally HEART: regular rate & rhythm and no lower extremity edema ABDOMEN: soft, non-tender and nondistended Musculoskeletal: no edema NEURO: no focal motor/sensory deficits  LABORATORY DATA:  I have reviewed the data as listed Lab Results  Component Value Date   WBC 5.7 11/17/2024   HGB 12.9 (L) 11/17/2024   HCT 40.1 11/17/2024   MCV 91.6 11/17/2024   PLT 167 11/17/2024   Recent Labs    07/06/24 1601 07/25/24 1214 10/01/24 2043 10/31/24 1158 11/17/24 1140  NA 136 137 138 137 136  K 4.1 4.6 4.4 4.7 3.9  CL 99 96* 100 102 97*  CO2 27 29 27 24 28   GLUCOSE 215* 137* 193* 117* 155*  BUN 27* 24* 21 22 25*  CREATININE 1.38 1.42* 1.26* 1.21 1.40*  CALCIUM  9.0 10.3 9.8 9.4 9.9  GFRNONAA  --  47* 54*  --  48*  PROT 7.0  --  7.6 7.0  --   ALBUMIN 3.8  --  4.2 3.9  --   AST  17  --  23 20  --   ALT 11  --  8 10  --   ALKPHOS 67  --  77 86  --   BILITOT 1.4*  --  1.1 1.6*  --     RADIOGRAPHIC STUDIES: I have personally reviewed the radiological images as listed and agreed with the findings in the report. No results found.

## 2024-11-23 ENCOUNTER — Inpatient Hospital Stay

## 2024-11-23 VITALS — BP 106/90 | HR 100 | Temp 97.8°F | Resp 18 | Ht 70.0 in | Wt 161.3 lb

## 2024-11-23 DIAGNOSIS — N390 Urinary tract infection, site not specified: Secondary | ICD-10-CM | POA: Diagnosis not present

## 2024-11-23 DIAGNOSIS — Z7189 Other specified counseling: Secondary | ICD-10-CM | POA: Insufficient documentation

## 2024-11-23 DIAGNOSIS — R339 Retention of urine, unspecified: Secondary | ICD-10-CM | POA: Diagnosis not present

## 2024-11-23 DIAGNOSIS — C678 Malignant neoplasm of overlapping sites of bladder: Secondary | ICD-10-CM | POA: Diagnosis not present

## 2024-11-23 DIAGNOSIS — C679 Malignant neoplasm of bladder, unspecified: Secondary | ICD-10-CM | POA: Insufficient documentation

## 2024-11-23 NOTE — Assessment & Plan Note (Addendum)
 Discussed goals of care, treatment versus palliative approach with hospice as above.

## 2024-11-23 NOTE — Assessment & Plan Note (Addendum)
 Palliative care referral When progression in the future, recommend hospice care to focus on quality of life

## 2024-11-24 ENCOUNTER — Telehealth: Payer: Self-pay | Admitting: *Deleted

## 2024-11-24 NOTE — Telephone Encounter (Signed)
-----   Message from Pauletta Chihuahua, MD sent at 11/23/2024  5:24 PM EST ----- E[place] Laurel would you fax the referral to [facility]? thanks

## 2024-11-24 NOTE — Telephone Encounter (Signed)
 Referral called and faxed to [facility]

## 2024-11-28 ENCOUNTER — Telehealth (HOSPITAL_COMMUNITY): Payer: Self-pay | Admitting: Cardiology

## 2024-11-28 ENCOUNTER — Ambulatory Visit (HOSPITAL_COMMUNITY)
Admission: RE | Admit: 2024-11-28 | Discharge: 2024-11-28 | Disposition: A | Discharge status: Home / Self Care | Source: Ambulatory Visit | Attending: Cardiology | Admitting: Cardiology

## 2024-11-28 VITALS — BP 116/62 | HR 95 | Wt 162.4 lb

## 2024-11-28 DIAGNOSIS — I480 Paroxysmal atrial fibrillation: Secondary | ICD-10-CM

## 2024-11-28 DIAGNOSIS — I340 Nonrheumatic mitral (valve) insufficiency: Secondary | ICD-10-CM | POA: Diagnosis not present

## 2024-11-28 DIAGNOSIS — Z9889 Other specified postprocedural states: Secondary | ICD-10-CM | POA: Insufficient documentation

## 2024-11-28 DIAGNOSIS — I4821 Permanent atrial fibrillation: Secondary | ICD-10-CM | POA: Insufficient documentation

## 2024-11-28 DIAGNOSIS — I255 Ischemic cardiomyopathy: Secondary | ICD-10-CM | POA: Diagnosis not present

## 2024-11-28 DIAGNOSIS — I739 Peripheral vascular disease, unspecified: Secondary | ICD-10-CM | POA: Diagnosis not present

## 2024-11-28 DIAGNOSIS — I6523 Occlusion and stenosis of bilateral carotid arteries: Secondary | ICD-10-CM | POA: Diagnosis not present

## 2024-11-28 DIAGNOSIS — I2510 Atherosclerotic heart disease of native coronary artery without angina pectoris: Secondary | ICD-10-CM | POA: Insufficient documentation

## 2024-11-28 DIAGNOSIS — I5022 Chronic systolic (congestive) heart failure: Secondary | ICD-10-CM | POA: Diagnosis not present

## 2024-11-28 DIAGNOSIS — Z951 Presence of aortocoronary bypass graft: Secondary | ICD-10-CM | POA: Insufficient documentation

## 2024-11-28 DIAGNOSIS — Z952 Presence of prosthetic heart valve: Secondary | ICD-10-CM | POA: Insufficient documentation

## 2024-11-28 DIAGNOSIS — Z7901 Long term (current) use of anticoagulants: Secondary | ICD-10-CM | POA: Diagnosis not present

## 2024-11-28 DIAGNOSIS — I252 Old myocardial infarction: Secondary | ICD-10-CM | POA: Insufficient documentation

## 2024-11-28 DIAGNOSIS — I5042 Chronic combined systolic (congestive) and diastolic (congestive) heart failure: Secondary | ICD-10-CM

## 2024-11-28 DIAGNOSIS — N183 Chronic kidney disease, stage 3 unspecified: Secondary | ICD-10-CM | POA: Diagnosis not present

## 2024-11-28 DIAGNOSIS — C679 Malignant neoplasm of bladder, unspecified: Secondary | ICD-10-CM | POA: Diagnosis not present

## 2024-11-28 DIAGNOSIS — R0602 Shortness of breath: Secondary | ICD-10-CM | POA: Diagnosis present

## 2024-11-28 LAB — BASIC METABOLIC PANEL WITH GFR
Anion gap: 15 (ref 5–15)
BUN: 24 mg/dL — ABNORMAL HIGH (ref 8–23)
CO2: 25 mmol/L (ref 22–32)
Calcium: 9.2 mg/dL (ref 8.9–10.3)
Chloride: 95 mmol/L — ABNORMAL LOW (ref 98–111)
Creatinine, Ser: 1.32 mg/dL — ABNORMAL HIGH (ref 0.61–1.24)
GFR, Estimated: 51 mL/min — ABNORMAL LOW (ref >60)
Glucose, Bld: 196 mg/dL — ABNORMAL HIGH (ref 70–99)
Potassium: 3.9 mmol/L (ref 3.5–5.1)
Sodium: 136 mmol/L (ref 135–145)

## 2024-11-28 LAB — PRO BRAIN NATRIURETIC PEPTIDE: Pro Brain Natriuretic Peptide: 2721 pg/mL — ABNORMAL HIGH (ref <300.0)

## 2024-11-28 NOTE — Progress Notes (Signed)
 ReDS Vest / Clip - 11/28/24 1531       ReDS Vest / Clip   Station Marker C    Ruler Value 27    ReDS Value Range High volume overload    ReDS Actual Value 40

## 2024-11-28 NOTE — Progress Notes (Signed)
 ADVANCED HF CLINIC NOTE                                                                                                                                                                                                                                                                                                                                                                                                                                                                                                   Patient ID: Nathaniel Keltz., male   DOB: 08-Apr-1932, 89 y.o.   MRN: [id number] PCP: Nathaniel Garnette KIDD, MD Cardiology: Dr. Rolan  Chief complaint: chronic systolic heart failure, worsening SOB    89 y.o. with history of extensive vascular disease including CAD s/p CABG and redo CABG, AAA s/p endovascular repair in 2015, PAD, and carotid stenosis as well as prior AVR.                             After his AAA repair, he was noted to be in atrial fibrillation.  He had had a prior  episode of atrial fibrillation after his cardiac surgery in 2007.  Atrial fibrillation was rate-controlled.  He was started on warfarin and Toprol  XL.  He developed exertional dyspnea and fatigue and was cardioverted back to SR 06/06/14.  Echo 06/15 EF worsened to 25-30% with stable bioprosthetic aortic valve, mildly dilated RV with normal systolic function.  LHC 07/15 showed stable anatomy.  The LIMA-LAD was patent, the sequential SVG-OM/ramus/D was patent only to ramus but this is the same as the prior cath, and the SVG-PDA was patent with a long 50-60% mid-graft stenosis.  No intervention.  Of note, at cath he was noted to be back in atrial fibrillation.  He was started on amiodarone  and had DCCV again on 08/21/14.  This was successful.  He has been off amiodarone  due to side effects (hallucinations).  Echo in 1/16 showed recovery of EF to 55-60%.    He was admitted in 1/19 with bronchitis/wheezing/dyspnea and was found to be in  atrial fibrillation with RVR.  He tested positive for metapneumovirus.  Echo showed EF 45-50% with normal bioprosthetic aortic valve. He was diuresed for volume overload while in the hospital. He had DCCV to NSR.    Carotid dopplers in 1/19 showed 80-99% RICA stenosis, he had right CEA in 2/19 with no complications.   At appointment in early 6/20, Nathaniel [name] was noted to be back in atrial fibrillation with RVR and mild CHF.  He was started on Toprol  XL and Lasix  was increased.  He had TEE-guided DCCV to NSR in 6/20.  TEE showed EF 50%, mild diffuse hypokinesis, mildly decreased RV systolic function, normally functioning bioprosthetic aortic valve.   In 2021, he was admitted for Tikosyn  initiation.  In 12/21, he went into atrial fibrillation again and was cardioverted back to NSR.   Echo in 1/22 showed EF 45-50%, diffuse hypokinesis, mildly decreased RV systolic function, moderate biatrial enlargement, bioprosthetic valve with mean gradient 8 mmHg.    In 2/22, he went into atrial fibrillation and had TEE-guided DCCV back to NSR.  TEE showed EF 45-50%.    In 3/22, he was back in the ER with atrial fibrillation triggered by [name] most likely.  He had a cardioversion back to NSR.   In 10/22, patient developed urinary retention and an E coli UTI.  In the setting of this, he had a fall at home and was unable to get up due to weakness for hours.  He developed rhabdomyolysis.  This resolved with minimal renal damage with IV fluid hydration and he was discharged to rehab.   Follow up 2/23 he was in atrial fibrillation, TEE/DCCV arranged however INR subtherapeutic. He cancelled rescheduled procedure due to a death in the family.  Follow up 3/23. Remained in AF. Volume overloaded. Lasix  increased for 3 days. Underwent TEE guided DCCV on 03/19/22.  TEE showed EF 50-55%, mildly decreased RV function, normal bioprosthetic aortic valve, mild Nathaniel and mild mitral stenosis (mean gradient 3 mmHg).    Recently  diagnosed with stage IA2 NSCLC RUL. Saw Radiation Oncology and Pulmonary and decided on empiric treatment with SBRT.  He has completed radiation.   Admitted 9/23 with NSTEMI and AF with RVR. Underwent R/LHC showing normal filling pressures, known occlusion of native coronaries and SVG-RCA from original CABG. New occlusion of SVG-PDA from CABG#2. Long 99% stenosis in the SVG-ramus. Ramus is relatively small vessel. Patent LIMA-LAD with collaterals to RCA territory. Case was discussed with Nathaniel [name], wouldn't benefit from intervention, medically managed. Patient declined re-challenge  of amiodarone  and was able to have successful TEE/DCCV to NSR. TEE showed EF 45% with normal RV, mild-moderate Nathaniel, normal bioprosthetic aortic valve, no LA appendage thrombus. Discharged home, weight 184 lbs.  Follow up 9/23, back in atrial fibrillation with HR 116. Toprol  increased to 25 bid and arranged for TEE/DCCV.  TEE/DCCV (10/23) showed EF 45%, diffuse HK, RV mildly decreased, moderate Nathaniel, Bioprosthetic AoV stable with mean gradient 6 mmHg, no LAA. Proceeded with successful DCCV to NSR.  However, later in 10/23 he was back in AF and has been in AF since that time.   Echo in 5/24 showed EF lower at 30-35%, moderate LV dilation, mild RV dysfunction, moderate-severe Nathaniel, bioprosthetic aortic valve appeared normal, IVC normal.   He was arranged for outpatient RHC done on 03/21/24 which demonstrated elevated right and left heart filling pressures, moderate mixed pulmonary arterial/pulmonary venous hypertension, prominent V-waves in PCWP tracing concerning for significant Nathaniel, as well as low CI and low PAPi (RA 15, PCWP 24 w/ V waves up to 35, PA sat 58%, FICK CI 2.07, PAPi 1.7, PVR 3.8 WU).  He was direct admitted for IV diuretics and further investigation of Nathaniel. Placed on Lasix  gtt. PICC was placed. Co-ox remained stable, did not require inotropic support. He diuresed well on lasix  gtt and transitioned back to torsemide  at 60  mg bid + HF GDMT. TEE was done to further investigate the mitral valve. Study showed EF 35%, mild LV dilation, mildly dilated RV with mildly decreased systolic function, moderate left atrial enlargement, normally functioning AVR. There was moderate mitral annular calcification with severe mitral regurgitation with a calcified and restricted posterior leaflet. There were 2 jets of mitral regurgitation, PISA ERO 0.21 cm^2 when applied to each jet, so total PISA ERO 0.42 cm^2. MV mean gradient 2 mmHg.  There was flattening but not definite flow reversal in the pulmonary vein systolic doppler pattern. Structural heart team was consulted for consideration for mTEER. Given severe mitral annular calcification, his anatomy was not felt suitable for clip.    He was recently diagnosed w/ urinary retention and found to have bladder cancer. Now s/p TURBT. He continues w/ indwelling foley.   He presents to clinic today as a work in for increased dyspnea. Reports NYHA Class IIIb symptoms. ReDs elevated at 40%. BP well controlled. He is in rate controlled Afib, chronic.   ECG: not performed today   PMH: 1. PAD: Peripheral arterial dopplers in 2012 showed > 75% bilateral SFA stenosis. Peripheral arterial dopplers in 10/14 showed > 50% focal bilateral SFA stenoses.  2. AAA: US  4/15 with 4.4 cm AAA but concern for penetrating ulcers. CTA abdomen showed 4.4 cm AAA with penetrating ulcers and possible pseudoaneurysms. Now s/p endovascular repair of AAA in 5/15 with no complications.  3. Carotid stenosis: Carotid dopplers (4/15) with 60-79% bilateral ICA stenosis. Carotid dopplers (11/15) with 60-79% bilateral ICA stenosis.  Carotid dopplers 6/16 with 60-79% bilateral ICA stenosis. - Carotid dopplers (1/18): 60-79% RICA stenosis.  - Carotid dopplers (1/19): 80-99% RICA stenosis => Right CEA in 2/19.  - Carotid dopplers (11/19): 40-59% bilateral stenosis.  - Carotid dopplers (1/21): 1-39% BICA stenosis.  4. CAD: CABG 1989  with LIMA-LAD, SVG-D, seq SVG-ramus and OM, sequential SVG-PDA/PLV. SVG-PDA/PLV found to be occluded on cath prior to AVR, so patient had SVG-PDA with AVR in 2007. LHC (7/15) with LIMA-LAD patent with 40-50% stenosis in LAD after touchdown, sequential SVG-ramus/OM/diagonal with only the ramus branch still intact (known from prior cath),  SVG-PDA from original surgery TO, SVG-PDA from redo surgery with long 50-60% mid-graft stenosis.  No target for intervention.  - LHC (9/23): known occlusion of native coronaries and SVG-RCA from original CABG, new occlusion of SVG-PDA from CABG#2, long 99% stenosis in the SVG-ramus (was sequential SVG-ramus/OM/D but OM and D branches noted to be lost on prior cath), ramus is relatively small vessel, patent LIMA-LAD with collaterals to RCA territory. Case reviewed with Nathaniel [name], probably not much benefit to intervention on SVG-ramus. Small territory covered and complex disease in SVG. Medical management advised. 5. Severe aortic stenosis: Bioprosthetic AVR in 2007.  6. Atrial fibrillation: Paroxysmal. Initially noted after cardiac surgery, then again after AAA repair.  DCCV to NSR 06/06/14.  DCCV to NSR again 08/21/14.  - DCCV to NSR in 1/19.  - DCCV to NSR in 6/20. .  - DCCV to NSR 12/21, 2/22, 3/22, 4/23, 9/23, 10/23 - Now permanent, off Tikosyn .  7. Type II diabetes  8. Gout  9. GERD  10. Hyperlipidemia  11. HTN 12. GERD  13. Chronic systolic CHF: Echo (6/15) with EF worsened to 25-30% with grade II diastolic dysfunction, stable bioprosthetic aortic valve, mildly dilated RV with normal systolic function.  Echo (1/16) with EF 55-60%, basal inferior hypokinesis, mild LVH, bioprosthetic aortic valve with mean gradient 13 mmHg, mild Nathaniel, severe LAE.  - Echo (1/19): EF 45-50%, mild Nathaniel, normal bioprosthetic aortic valve.  - TEE (6/20): EF 50% with mild diffuse hypokinesis, mildly decreased RV systolic function with mild RV enlargement, bioprosthetic aortic valve looked  ok, mild Nathaniel. - Echo (1/22):  EF 45-50%, diffuse hypokinesis, mildly decreased RV systolic function, moderate biatrial enlargement, bioprosthetic valve with mean gradient 8 mmHg.   - TEE (2/22): EF 45-50%, RV normal, moderate biatrial enlargement, bioprosthetic AoV with mean gradient 6 mmHg, mild Nathaniel.  - TEE (4/23): EF 50-55%, mildly decreased RV function, normal bioprosthetic aortic valve, mild Nathaniel and mild mitral stenosis (mean gradient 3 mmHg) - TEE (9/23): EF 45%, RV normal, no LAA thrombus, no PFO/ASD, mild to moderate Nathaniel w/ moderate MAC, AoV without stenosis/regurgitation - TEE (10/23): EF 45%, RV mildly decreased, no LAA thrombus, no PFO/ASD, moderate Nathaniel, AoV without stenosis/regurgitation, mean gradient 6 mmHg. - Echo (5/24): EF 30-35%, moderate LV dilation, mild RV dysfunction, moderate-severe Nathaniel, bioprosthetic aortic valve appeared normal, IVC normal.  - RHC (4/25): mean RA 13, PA 51/26 mean 46, mean PCWP 24, CI 2.07, PVR 3.8 WU, PAPi 1.7 - TEE (4/25): EF 35%, mild LV dilation, mildly dilated RV with mildly decreased systolic function, moderate left atrial enlargement, normally functioning bioprosthetic AVR, severe Nathaniel with restricted mitral leaflets.   14. BPH  15. Nonsmall cell lung cancer: Suspected by imaging, treated in 2023 with radiation.  16. Fall with rhabdomyolysis (10/22)  17. Severe Mitral Regurgitation: TEE (4/25) moderate MAC with severe Nathaniel with a calcified and restricted posterior leaflet. There were 2 jets of mitral regurgitation, PISA ERO 0.21 cm^2 when applied to each jet, so total PISA ERO 0.42 cm^2. MV mean gradient 2 mmHg.  There was flattening but not definite flow reversal in the pulmonary vein systolic doppler pattern. - TEE reviewed by structural heart service, thought not to be good mTEER candidate due to posterior leaflet calcification.   SH: Widower, 2 children, raised his 3 grand-daughters, lives in [place], [place].  Occasional ETOH, no smoking.   FH: Father  died from ruptured AAA.   ROS: All systems reviewed and negative except as per HPI.  Current Outpatient Medications  Medication Sig Dispense Refill   atorvastatin  (LIPITOR) 10 MG tablet Take 10 mg by mouth every other day.     atorvastatin  (LIPITOR) 20 MG tablet Take 1 tablet by mouth Daily. 90 tablet 3   Coenzyme Q10 (CO Q10) 200 MG CAPS Take 200 mg by mouth daily.     dorzolamide -timolol  (COSOPT ) 22.3-6.8 MG/ML ophthalmic solution Place 1 drop into both eyes 2 (two) times daily.     ezetimibe  (ZETIA ) 10 MG tablet TAKE ONE TABLET BY MOUTH AT BEDTIME 90 tablet 3   latanoprost  (XALATAN ) 0.005 % ophthalmic solution Place 1 drop into both eyes at bedtime.     METAMUCIL FIBER PO Take 1 Dose by mouth as needed (constipation).     Misc Natural Products (GLUCOSAMINE CHONDROITIN TRIPLE) TABS Take 1 tablet by mouth daily.     Misc. Devices (GNP DIGITAL WEIGHT SCALE) MISC Use daily as needed to measure weight for fluid retention due to CHF 1 each 0   polyethylene glycol (MIRALAX ) 17 g packet Take 17 g by mouth daily. 14 each 0   potassium chloride  SA (KLOR-CON  M) 20 MEQ tablet TAKE ONE TABLET BY MOUTH TWICE DAILY 90 tablet 3   REPATHA  SURECLICK 140 MG/ML SOAJ INJECT 1 PEN INTO SKIN EVERY 14 DAYS 2 mL 11   spironolactone  (ALDACTONE ) 25 MG tablet TAKE ONE TABLET BY MOUTH EVERY DAY 30 tablet 5   tamsulosin  (FLOMAX ) 0.4 MG CAPS capsule TAKE ONE CAPSULE BY MOUTH TWICE DAILY 180 capsule 3   torsemide  (DEMADEX ) 20 MG tablet Take 3 tablets (60 mg total) by mouth every morning AND 2 tablets (40 mg total) every evening. 180 tablet 1   warfarin (COUMADIN ) 3 MG tablet TAKE TWO TO THREE TABLETS DAILY OR AS DIRECTED BY COUMADIN  CLINIC 90 tablet 0   No current facility-administered medications for this encounter.   BP 116/62   Pulse 95   Wt 73.7 kg (162 lb 6 oz)   SpO2 98%   BMI 23.30 kg/m    Wt Readings from Last 3 Encounters:  11/28/24 73.7 kg (162 lb 6 oz)  11/23/24 73.2 kg (161 lb 4.8 oz)  11/20/24  76.6 kg (168 lb 14 oz)   Vitals:   11/28/24 1511  BP: 116/62  Pulse: 95  SpO2: 98%    Physical Exam  GENERAL: elderly M, NAD Lungs- diminished at the bases bilaterally  CARDIAC:  JVP 8 cm          Irregularly irregular regular rhythm. No MRG. Trace b/l LEE  ABDOMEN: Soft, non-tender, non-distended.  EXTREMITIES: Warm and well perfused.  NEUROLOGIC: No obvious FND   ReDs reading: 40%, abnormal    Assessment/Plan:  1.  Chronic Systolic Heart Failure: Ischemic cardiomyopathy.  Echo 9/23 with stable EF 45-50%, LV global hypokinesis, RV systolic function normal. Filling pressures normal on RHC (9/23). TEE in 10/23 showed EF 45%, diffuse HK, RV mildly decreased, moderate Nathaniel, bioprosthetic AoV stable with mean gradient 6 mmHg, no LAA. Echo 5/24 EF 30-35%, RV mildly reduced, Nathaniel moderate-severe. Suspect further drop in EF may be tachycardia-mediated with permanent AF.  TEE in 4/25 showed EF 35%, RV mildly reduced, severe Nathaniel. RHC 4/25 demonstrated elevated right and left heart filling pressures, moderate mixed pulmonary arterial/pulmonary venous hypertension, prominent V-waves in PCWP tracing concerning for significant Nathaniel, as well as low CI and low PAPi.   - NYHA Class IIIb. Volume overloaded on exam and by ReDs, 40%  - increase torsemide  to 80/40 mg x  2 days then back to 60/40 mg thereafter, increase KCL to 40/20 x 2 days then back to 20 mEq bid thereafter  - off Jardiance , Toprol  XL and now digoxin  due to side effects (marked fatigue. Feels much better off meds, does not want to retry)   - Continue Spironolactone  25 mg daily  - Check BMP and BNP today 2. Mitral Regurgitation: prominent V waves on PCWP tracing, up to 35, on 4/25 RHC. Subsequent TEE 4/25 confirmed severe mitral regurgitation with a calcified and restricted posterior leaflet. Mod-severe MAC. There were 2 jets of mitral regurgitation, PISA ERO 0.21 cm^2 when applied to each jet, so total PISA ERO 0.42 cm^2. MV mean gradient 2 mmHg.   There was flattening but not definite flow reversal in the pulmonary vein systolic doppler pattern. He was evaluated by the Structural Heart Team. Given severe mitral annular calcification, his anatomy was not felt suitable for mTEER.    - Medical management.  3.  CAD: s/p CABG and Redo CABG. LHC 06/2014 showed significant coronary disease w/ no good interventional targets (LIMA-LAD patent with 40-50% stenosis in LAD after touchdown, sequential SVG-ramus/OM/diagonal with only the ramus branch still intact (known from prior cath), SVG-PDA from original surgery TO, SVG-PDA from redo surgery with long 50-60% mid-graft stenosis). NSTEMI 9/23, repeat LHC showed occlusion of the 2nd SVG-PDA and 99% stenosis in the SVG-ramus.  Probably not much benefit to intervention on SVG-ramus.  Small territory covered and complex disease in the SVG. Stable w/o CP  - Now off ASA w/ warfarin use  - Continue atorvastatin  20 mg daily + Zetia  + Repatha .  Most recent LDL 5/25 was 92 mg/dL.  4. Atrial fibrillation: Now permanent. Seen by EP, not thought to be good ablation candidate. He is well rate controlled.  - Continue warfarin. INR followed by coumadin  clinic. - Off Toprolol and digoxin  due to fatigue. HR 90s 4. Bioprosthetic AVR:  Normal function on TEE 4/25  5. CKD stage 3: B/l SCr ~1.3 - he stopped SGLT2i (Jardiance ) due to side effects. He does not want to retry  6. PAD:  AAA s/p EVR, LE PAD + Carotid Artery Disease s/p Rt CEA. No current claudication symptoms.  - Followed by Dr. Serene. - Continue lipid lowering agents per above.  7. Bladder Cancer  - s/pTURBT - followed by urology/oncology   F/u w/ APP in 4 wks   Caffie Shed, PA-C 11/28/2024

## 2024-11-28 NOTE — Telephone Encounter (Signed)
 Pt son called to request evaluation of SOB  Aware pt is in chronic a-fib however over the past few days pt has been struggling to catch his breath   Has seen multiple providers (uti,bladder cancer) and pt request appt with HF clinic to address SOB ?order for oxygen    Add on 12/ 16 @3 

## 2024-11-28 NOTE — Patient Instructions (Addendum)
 Medication Changes:  INCREASE TORSEMIDE  TO 80MG  (4) TABLETS IN THE MORNING, AND 60MG  (3) TABLETS IN THE EVENING FOR 2 DAYS ONLY   THEN RETURN TO NORMAL DOSING OF 60MG  (3) TABLETS IN THE MORNING AND 40MG  (2) TABLETS IN THE EVENING THERAFTER  INCREASE POTASSIUM TO 40 (2) TABLETS IN THE MORNING AND (1) TABLET IN THE EVENING FOR THE NEXT 2 DAYS   THEN RETURN TO NORMAL DOSING OF (1) TABLET TWICE DAILY THERE AFTER   Lab Work:  Labs done today, your results will be available in MyChart, we will contact you for abnormal readings.  Follow-Up in: 4 WEEKS AS SCHEDULED WITH APP CLINIC   At the [facility], you and your health needs are our priority. We have a designated team specialized in the treatment of Heart Failure. This Care Team includes your primary Heart Failure Specialized Cardiologist (physician), Advanced Practice Providers (APPs- Physician Assistants and Nurse Practitioners), and Pharmacist who all work together to provide you with the care you need, when you need it.   You may see any of the following providers on your designated Care Team at your next follow up:  Dr. Toribio Fuel Dr. Ezra Shuck Dr. Odis Brownie Greig Mosses, NP Caffie Shed, [place] [facility] [place], [place] Beckey Coe, NP Jordan Lee, NP Tinnie Redman, PharmD   Please be sure to bring in all your medications bottles to every appointment.   Need to Contact Us :  If you have any questions or concerns before your next appointment please send us  a message through [place] or call our office at [phone].    TO LEAVE A MESSAGE FOR THE NURSE SELECT OPTION 2, PLEASE LEAVE A MESSAGE INCLUDING: YOUR NAME DATE OF BIRTH CALL BACK NUMBER REASON FOR CALL**this is important as we prioritize the call backs  YOU WILL RECEIVE A CALL BACK THE SAME DAY AS LONG AS YOU CALL BEFORE 4:00 PM

## 2024-12-19 ENCOUNTER — Telehealth: Payer: Self-pay | Admitting: Family Medicine

## 2024-12-19 DIAGNOSIS — C679 Malignant neoplasm of bladder, unspecified: Secondary | ICD-10-CM

## 2024-12-19 NOTE — Telephone Encounter (Signed)
 Spoke with savannah and got the referral sent over for hospice care with [facility].   Copied from CRM [id number]. Topic: Referral - Question >> Dec 19, 2024  4:09 PM Nathaniel [name] wrote: Reason for CRM: Nathaniel [name] [id number] , have a nurse that can be there at 5 oclock currently on standby , patient needs hospice referral, would like a callback

## 2024-12-19 NOTE — Telephone Encounter (Signed)
 Please review patient message and advise.   Copied from CRM #[id number]. Topic: Clinical - Medical Advice >> Dec 19, 2024  2:16 PM Harlene ORN wrote: Reason for CRM:  patient has late stage bladder cancer was instructed to call his PCP and request a Hospice Order. Name: Nathaniel [name]; Phone: [phone] [facility] Doctor, [place], [phone]  Please call back the son, Nathaniel [name], to discuss any questions: [phone]

## 2024-12-19 NOTE — Telephone Encounter (Signed)
 Nathaniel [name] has provided this referral.  They also reached out to me through epic and I confirmed I would be the attending physician

## 2024-12-19 NOTE — Telephone Encounter (Signed)
 Gordy has called back and stated that the request needs to be sent to [facility] for hospice . Phone number: [phone] to speak with office

## 2024-12-21 ENCOUNTER — Telehealth: Payer: Self-pay | Admitting: Family Medicine

## 2024-12-21 NOTE — Telephone Encounter (Signed)
 [facility] faxed Hospice Certification and plan of care (Order ID [id number]), to be filled out by provider. [facility] requested to send it back via Fax within ASAP. Document is located in providers tray at front office.Please advise at [phone].

## 2024-12-26 NOTE — Progress Notes (Incomplete)
 "  ADVANCED HF CLINIC NOTE                                                                                                                                                                                                                                                                                                                                                                                                                                                                                                   Patient ID: Nathaniel [name]., male   DOB: 1932/09/25, 89 y.o.   MRN: [id number] PCP: Katrinka Garnette KIDD, MD Cardiology: Dr. Rolan  Chief complaint: chronic systolic heart failure, worsening SOB    89 y.o. with history of extensive vascular disease including CAD s/p CABG and redo CABG, AAA s/p endovascular repair in 2015, PAD, and carotid stenosis as well as prior AVR.                             After his AAA repair, he was noted to be in atrial fibrillation.  He had had a  prior episode of atrial fibrillation after his cardiac surgery in 2007.  Atrial fibrillation was rate-controlled.  He was started on warfarin and Toprol  XL.  He developed exertional dyspnea and fatigue and was cardioverted back to SR 06/06/14.  Echo 06/15 EF worsened to 25-30% with stable bioprosthetic aortic valve, mildly dilated RV with normal systolic function.  LHC 07/15 showed stable anatomy.  The LIMA-LAD was patent, the sequential SVG-OM/ramus/D was patent only to ramus but this is the same as the prior cath, and the SVG-PDA was patent with a long 50-60% mid-graft stenosis.  No intervention.  Of note, at cath he was noted to be back in atrial fibrillation.  He was started on amiodarone  and had DCCV again on 08/21/14.  This was successful.  He has been off amiodarone  due to side effects (hallucinations).  Echo in 1/16 showed recovery of EF to 55-60%.    He was admitted in 1/19 with bronchitis/wheezing/dyspnea and was found to be in  atrial fibrillation with RVR.  He tested positive for metapneumovirus.  Echo showed EF 45-50% with normal bioprosthetic aortic valve. He was diuresed for volume overload while in the hospital. He had DCCV to NSR.    Carotid dopplers in 1/19 showed 80-99% RICA stenosis, he had right CEA in 2/19 with no complications.   At appointment in early 6/20, [name] was noted to be back in atrial fibrillation with RVR and mild CHF.  He was started on Toprol  XL and Lasix  was increased.  He had TEE-guided DCCV to NSR in 6/20.  TEE showed EF 50%, mild diffuse hypokinesis, mildly decreased RV systolic function, normally functioning bioprosthetic aortic valve.   In 2021, he was admitted for Tikosyn  initiation.  In 12/21, he went into atrial fibrillation again and was cardioverted back to NSR.   Echo in 1/22 showed EF 45-50%, diffuse hypokinesis, mildly decreased RV systolic function, moderate biatrial enlargement, bioprosthetic valve with mean gradient 8 mmHg.    In 2/22, he went into atrial fibrillation and had TEE-guided DCCV back to NSR.  TEE showed EF 45-50%.    In 3/22, he was back in the ER with atrial fibrillation triggered by [name] most likely.  He had a cardioversion back to NSR.   In 10/22, patient developed urinary retention and an E coli UTI.  In the setting of this, he had a fall at home and was unable to get up due to weakness for hours.  He developed rhabdomyolysis.  This resolved with minimal renal damage with IV fluid hydration and he was discharged to rehab.   Follow up 2/23 he was in atrial fibrillation, TEE/DCCV arranged however INR subtherapeutic. He cancelled rescheduled procedure due to a death in the family.  Follow up 3/23. Remained in AF. Volume overloaded. Lasix  increased for 3 days. Underwent TEE guided DCCV on 03/19/22.  TEE showed EF 50-55%, mildly decreased RV function, normal bioprosthetic aortic valve, mild MR and mild mitral stenosis (mean gradient 3 mmHg).    Recently  diagnosed with stage IA2 NSCLC RUL. Saw Radiation Oncology and Pulmonary and decided on empiric treatment with SBRT.  He has completed radiation.   Admitted 9/23 with NSTEMI and AF with RVR. Underwent R/LHC showing normal filling pressures, known occlusion of native coronaries and SVG-RCA from original CABG. New occlusion of SVG-PDA from CABG#2. Long 99% stenosis in the SVG-ramus. Ramus is relatively small vessel. Patent LIMA-LAD with collaterals to RCA territory. Case was discussed with Dr. Wendel, wouldn't benefit from intervention, medically managed. Patient declined  re-challenge of amiodarone  and was able to have successful TEE/DCCV to NSR. TEE showed EF 45% with normal RV, mild-moderate MR, normal bioprosthetic aortic valve, no LA appendage thrombus. Discharged home, weight 184 lbs.  Follow up 9/23, back in atrial fibrillation with HR 116. Toprol  increased to 25 bid and arranged for TEE/DCCV.  TEE/DCCV (10/23) showed EF 45%, diffuse HK, RV mildly decreased, moderate MR, Bioprosthetic AoV stable with mean gradient 6 mmHg, no LAA. Proceeded with successful DCCV to NSR.  However, later in 10/23 he was back in AF and has been in AF since that time.   Echo in 5/24 showed EF lower at 30-35%, moderate LV dilation, mild RV dysfunction, moderate-severe MR, bioprosthetic aortic valve appeared normal, IVC normal.   He was arranged for outpatient RHC done on 03/21/24 which demonstrated elevated right and left heart filling pressures, moderate mixed pulmonary arterial/pulmonary venous hypertension, prominent V-waves in PCWP tracing concerning for significant MR, as well as low CI and low PAPi (RA 15, PCWP 24 w/ V waves up to 35, PA sat 58%, FICK CI 2.07, PAPi 1.7, PVR 3.8 WU).  He was direct admitted for IV diuretics and further investigation of MR. Placed on Lasix  gtt. PICC was placed. Co-ox remained stable, did not require inotropic support. He diuresed well on lasix  gtt and transitioned back to torsemide  at 60  mg bid + HF GDMT. TEE was done to further investigate the mitral valve. Study showed EF 35%, mild LV dilation, mildly dilated RV with mildly decreased systolic function, moderate left atrial enlargement, normally functioning AVR. There was moderate mitral annular calcification with severe mitral regurgitation with a calcified and restricted posterior leaflet. There were 2 jets of mitral regurgitation, PISA ERO 0.21 cm^2 when applied to each jet, so total PISA ERO 0.42 cm^2. MV mean gradient 2 mmHg.  There was flattening but not definite flow reversal in the pulmonary vein systolic doppler pattern. Structural heart team was consulted for consideration for mTEER. Given severe mitral annular calcification, his anatomy was not felt suitable for clip.    He was recently diagnosed w/ urinary retention and found to have bladder cancer. Now s/p TURBT. He continues w/ indwelling foley.   He presents to clinic today as a work in for increased dyspnea. Reports NYHA Class IIIb symptoms. ReDs elevated at 40%. BP well controlled. He is in rate controlled Afib, chronic.   ECG: not performed today   PMH: 1. PAD: Peripheral arterial dopplers in 2012 showed > 75% bilateral SFA stenosis. Peripheral arterial dopplers in 10/14 showed > 50% focal bilateral SFA stenoses.  2. AAA: US  4/15 with 4.4 cm AAA but concern for penetrating ulcers. CTA abdomen showed 4.4 cm AAA with penetrating ulcers and possible pseudoaneurysms. Now s/p endovascular repair of AAA in 5/15 with no complications.  3. Carotid stenosis: Carotid dopplers (4/15) with 60-79% bilateral ICA stenosis. Carotid dopplers (11/15) with 60-79% bilateral ICA stenosis.  Carotid dopplers 6/16 with 60-79% bilateral ICA stenosis. - Carotid dopplers (1/18): 60-79% RICA stenosis.  - Carotid dopplers (1/19): 80-99% RICA stenosis => Right CEA in 2/19.  - Carotid dopplers (11/19): 40-59% bilateral stenosis.  - Carotid dopplers (1/21): 1-39% BICA stenosis.  4. CAD: CABG 1989  with LIMA-LAD, SVG-D, seq SVG-ramus and OM, sequential SVG-PDA/PLV. SVG-PDA/PLV found to be occluded on cath prior to AVR, so patient had SVG-PDA with AVR in 2007. LHC (7/15) with LIMA-LAD patent with 40-50% stenosis in LAD after touchdown, sequential SVG-ramus/OM/diagonal with only the ramus branch still intact (known from prior  cath), SVG-PDA from original surgery TO, SVG-PDA from redo surgery with long 50-60% mid-graft stenosis.  No target for intervention.  - LHC (9/23): known occlusion of native coronaries and SVG-RCA from original CABG, new occlusion of SVG-PDA from CABG#2, long 99% stenosis in the SVG-ramus (was sequential SVG-ramus/OM/D but OM and D branches noted to be lost on prior cath), ramus is relatively small vessel, patent LIMA-LAD with collaterals to RCA territory. Case reviewed with Dr. Wendel, probably not much benefit to intervention on SVG-ramus. Small territory covered and complex disease in SVG. Medical management advised. 5. Severe aortic stenosis: Bioprosthetic AVR in 2007.  6. Atrial fibrillation: Paroxysmal. Initially noted after cardiac surgery, then again after AAA repair.  DCCV to NSR 06/06/14.  DCCV to NSR again 08/21/14.  - DCCV to NSR in 1/19.  - DCCV to NSR in 6/20. .  - DCCV to NSR 12/21, 2/22, 3/22, 4/23, 9/23, 10/23 - Now permanent, off Tikosyn .  7. Type II diabetes  8. Gout  9. GERD  10. Hyperlipidemia  11. HTN 12. GERD  13. Chronic systolic CHF: Echo (6/15) with EF worsened to 25-30% with grade II diastolic dysfunction, stable bioprosthetic aortic valve, mildly dilated RV with normal systolic function.  Echo (1/16) with EF 55-60%, basal inferior hypokinesis, mild LVH, bioprosthetic aortic valve with mean gradient 13 mmHg, mild MR, severe LAE.  - Echo (1/19): EF 45-50%, mild MR, normal bioprosthetic aortic valve.  - TEE (6/20): EF 50% with mild diffuse hypokinesis, mildly decreased RV systolic function with mild RV enlargement, bioprosthetic aortic valve looked  ok, mild MR. - Echo (1/22):  EF 45-50%, diffuse hypokinesis, mildly decreased RV systolic function, moderate biatrial enlargement, bioprosthetic valve with mean gradient 8 mmHg.   - TEE (2/22): EF 45-50%, RV normal, moderate biatrial enlargement, bioprosthetic AoV with mean gradient 6 mmHg, mild MR.  - TEE (4/23): EF 50-55%, mildly decreased RV function, normal bioprosthetic aortic valve, mild MR and mild mitral stenosis (mean gradient 3 mmHg) - TEE (9/23): EF 45%, RV normal, no LAA thrombus, no PFO/ASD, mild to moderate MR w/ moderate MAC, AoV without stenosis/regurgitation - TEE (10/23): EF 45%, RV mildly decreased, no LAA thrombus, no PFO/ASD, moderate MR, AoV without stenosis/regurgitation, mean gradient 6 mmHg. - Echo (5/24): EF 30-35%, moderate LV dilation, mild RV dysfunction, moderate-severe MR, bioprosthetic aortic valve appeared normal, IVC normal.  - RHC (4/25): mean RA 13, PA 51/26 mean 46, mean PCWP 24, CI 2.07, PVR 3.8 WU, PAPi 1.7 - TEE (4/25): EF 35%, mild LV dilation, mildly dilated RV with mildly decreased systolic function, moderate left atrial enlargement, normally functioning bioprosthetic AVR, severe MR with restricted mitral leaflets.   14. BPH  15. Nonsmall cell lung cancer: Suspected by imaging, treated in 2023 with radiation.  16. Fall with rhabdomyolysis (10/22)  17. Severe Mitral Regurgitation: TEE (4/25) moderate MAC with severe MR with a calcified and restricted posterior leaflet. There were 2 jets of mitral regurgitation, PISA ERO 0.21 cm^2 when applied to each jet, so total PISA ERO 0.42 cm^2. MV mean gradient 2 mmHg.  There was flattening but not definite flow reversal in the pulmonary vein systolic doppler pattern. - TEE reviewed by structural heart service, thought not to be good mTEER candidate due to posterior leaflet calcification.   SH: Widower, 2 children, raised his 3 grand-daughters, lives in [name], [place].  Occasional ETOH, no smoking.   FH: Father  died from ruptured AAA.   ROS: All systems reviewed and negative except as per HPI.  Current Outpatient Medications  Medication Sig Dispense Refill   atorvastatin  (LIPITOR) 10 MG tablet Take 10 mg by mouth every other day.     atorvastatin  (LIPITOR) 20 MG tablet Take 1 tablet by mouth Daily. 90 tablet 3   Coenzyme Q10 (CO Q10) 200 MG CAPS Take 200 mg by mouth daily.     dorzolamide -timolol  (COSOPT ) 22.3-6.8 MG/ML ophthalmic solution Place 1 drop into both eyes 2 (two) times daily.     ezetimibe  (ZETIA ) 10 MG tablet TAKE ONE TABLET BY MOUTH AT BEDTIME 90 tablet 3   latanoprost  (XALATAN ) 0.005 % ophthalmic solution Place 1 drop into both eyes at bedtime.     METAMUCIL FIBER PO Take 1 Dose by mouth as needed (constipation).     Misc Natural Products (GLUCOSAMINE CHONDROITIN TRIPLE) TABS Take 1 tablet by mouth daily.     Misc. Devices (GNP DIGITAL WEIGHT SCALE) MISC Use daily as needed to measure weight for fluid retention due to CHF 1 each 0   polyethylene glycol (MIRALAX ) 17 g packet Take 17 g by mouth daily. 14 each 0   potassium chloride  SA (KLOR-CON  M) 20 MEQ tablet TAKE ONE TABLET BY MOUTH TWICE DAILY 90 tablet 3   REPATHA  SURECLICK 140 MG/ML SOAJ INJECT 1 PEN INTO SKIN EVERY 14 DAYS 2 mL 11   spironolactone  (ALDACTONE ) 25 MG tablet TAKE ONE TABLET BY MOUTH EVERY DAY 30 tablet 5   tamsulosin  (FLOMAX ) 0.4 MG CAPS capsule TAKE ONE CAPSULE BY MOUTH TWICE DAILY 180 capsule 3   torsemide  (DEMADEX ) 20 MG tablet Take 3 tablets (60 mg total) by mouth every morning AND 2 tablets (40 mg total) every evening. 180 tablet 1   warfarin (COUMADIN ) 3 MG tablet TAKE TWO TO THREE TABLETS DAILY OR AS DIRECTED BY COUMADIN  CLINIC 90 tablet 0   No current facility-administered medications for this visit.   There were no vitals taken for this visit.   Wt Readings from Last 3 Encounters:  11/28/24 73.7 kg (162 lb 6 oz)  11/23/24 73.2 kg (161 lb 4.8 oz)  11/20/24 76.6 kg (168 lb 14 oz)   There were no  vitals filed for this visit.   Physical Exam  GENERAL: elderly M, NAD Lungs- diminished at the bases bilaterally  CARDIAC:  JVP 8 cm          Irregularly irregular regular rhythm. No MRG. Trace b/l LEE  ABDOMEN: Soft, non-tender, non-distended.  EXTREMITIES: Warm and well perfused.  NEUROLOGIC: No obvious FND   ReDs reading: 40%, abnormal    Assessment/Plan:  1.  Chronic Systolic Heart Failure: Ischemic cardiomyopathy.  Echo 9/23 with stable EF 45-50%, LV global hypokinesis, RV systolic function normal. Filling pressures normal on RHC (9/23). TEE in 10/23 showed EF 45%, diffuse HK, RV mildly decreased, moderate MR, bioprosthetic AoV stable with mean gradient 6 mmHg, no LAA. Echo 5/24 EF 30-35%, RV mildly reduced, MR moderate-severe. Suspect further drop in EF may be tachycardia-mediated with permanent AF.  TEE in 4/25 showed EF 35%, RV mildly reduced, severe MR. RHC 4/25 demonstrated elevated right and left heart filling pressures, moderate mixed pulmonary arterial/pulmonary venous hypertension, prominent V-waves in PCWP tracing concerning for significant MR, as well as low CI and low PAPi.   - NYHA Class IIIb. Volume overloaded on exam and by ReDs, 40%  - increase torsemide  to 80/40 mg x 2 days then back to 60/40 mg thereafter, increase KCL to 40/20 x 2 days then back to 20 mEq bid thereafter  -  off Jardiance , Toprol  XL and now digoxin  due to side effects (marked fatigue. Feels much better off meds, does not want to retry)   - Continue Spironolactone  25 mg daily  - Check BMP and BNP today 2. Mitral Regurgitation: prominent V waves on PCWP tracing, up to 35, on 4/25 RHC. Subsequent TEE 4/25 confirmed severe mitral regurgitation with a calcified and restricted posterior leaflet. Mod-severe MAC. There were 2 jets of mitral regurgitation, PISA ERO 0.21 cm^2 when applied to each jet, so total PISA ERO 0.42 cm^2. MV mean gradient 2 mmHg.  There was flattening but not definite flow reversal in the  pulmonary vein systolic doppler pattern. He was evaluated by the Structural Heart Team. Given severe mitral annular calcification, his anatomy was not felt suitable for mTEER.    - Medical management.  3.  CAD: s/p CABG and Redo CABG. LHC 06/2014 showed significant coronary disease w/ no good interventional targets (LIMA-LAD patent with 40-50% stenosis in LAD after touchdown, sequential SVG-ramus/OM/diagonal with only the ramus branch still intact (known from prior cath), SVG-PDA from original surgery TO, SVG-PDA from redo surgery with long 50-60% mid-graft stenosis). NSTEMI 9/23, repeat LHC showed occlusion of the 2nd SVG-PDA and 99% stenosis in the SVG-ramus.  Probably not much benefit to intervention on SVG-ramus.  Small territory covered and complex disease in the SVG. Stable w/o CP  - Now off ASA w/ warfarin use  - Continue atorvastatin  20 mg daily + Zetia  + Repatha .  Most recent LDL 5/25 was 92 mg/dL.  4. Atrial fibrillation: Now permanent. Seen by EP, not thought to be good ablation candidate. He is well rate controlled.  - Continue warfarin. INR followed by coumadin  clinic. - Off Toprolol and digoxin  due to fatigue. HR 90s 4. Bioprosthetic AVR:  Normal function on TEE 4/25  5. CKD stage 3: B/l SCr ~1.3 - he stopped SGLT2i (Jardiance ) due to side effects. He does not want to retry  6. PAD:  AAA s/p EVR, LE PAD + Carotid Artery Disease s/p Rt CEA. No current claudication symptoms.  - Followed by Dr. Serene. - Continue lipid lowering agents per above.  7. Bladder Cancer  - s/pTURBT - followed by urology/oncology   F/u w/ APP in 4 wks   Harlene CHRISTELLA Gainer, PA-C 12/26/2024 "

## 2024-12-27 ENCOUNTER — Ambulatory Visit (HOSPITAL_COMMUNITY)

## 2025-01-10 ENCOUNTER — Telehealth: Payer: Self-pay | Admitting: Family Medicine

## 2025-01-10 ENCOUNTER — Other Ambulatory Visit: Payer: Self-pay | Admitting: Family Medicine

## 2025-01-10 DIAGNOSIS — C679 Malignant neoplasm of bladder, unspecified: Secondary | ICD-10-CM

## 2025-01-10 DIAGNOSIS — Z7189 Other specified counseling: Secondary | ICD-10-CM

## 2025-01-10 NOTE — Telephone Encounter (Signed)
 Spoke with Nathaniel [name] and faxed over orders for [facility] per her request.   Copied from CRM #[id number]. Topic: General - Other >> Jan 09, 2025  3:57 PM Mercer PEDLAR wrote: Reason for CRM: [name] is requesting a home health order for skilled nursing 2 times a day or PRN as needed per family request for medication administration.  Fax:[phone] Callback: [phone]

## 2025-01-11 ENCOUNTER — Telehealth: Payer: Self-pay | Admitting: Family Medicine

## 2025-01-11 NOTE — Telephone Encounter (Signed)
 Lvm to sche with pcp for paper work. -aw

## 2025-01-11 NOTE — Telephone Encounter (Signed)
 Pt must be seen in office or a virtual appt can be provided to sign off on home health orders completely, please get him scheduled with Dr.Hunter. Thank You! aw

## 2025-01-11 NOTE — Telephone Encounter (Signed)
 Pt must be seen in office or virtual appt to be able to sign off on home health orders completely.   Thanks!!

## 2025-01-14 DEATH — deceased

## 2025-07-16 ENCOUNTER — Ambulatory Visit
